# Patient Record
Sex: Female | Born: 1935 | Race: White | Hispanic: No | Marital: Married | Smoking: Former smoker
Health system: Southern US, Community
[De-identification: ages and names within clinical notes are randomized; demographics above are authoritative.]

## PROBLEM LIST (undated history)

## (undated) DIAGNOSIS — Z85528 Personal history of other malignant neoplasm of kidney: Secondary | ICD-10-CM

## (undated) DIAGNOSIS — K219 Gastro-esophageal reflux disease without esophagitis: Secondary | ICD-10-CM

## (undated) DIAGNOSIS — E669 Obesity, unspecified: Secondary | ICD-10-CM

## (undated) DIAGNOSIS — F419 Anxiety disorder, unspecified: Secondary | ICD-10-CM

## (undated) DIAGNOSIS — H269 Unspecified cataract: Secondary | ICD-10-CM

## (undated) DIAGNOSIS — R200 Anesthesia of skin: Secondary | ICD-10-CM

## (undated) DIAGNOSIS — N189 Chronic kidney disease, unspecified: Secondary | ICD-10-CM

## (undated) DIAGNOSIS — F329 Major depressive disorder, single episode, unspecified: Secondary | ICD-10-CM

## (undated) DIAGNOSIS — J40 Bronchitis, not specified as acute or chronic: Secondary | ICD-10-CM

## (undated) DIAGNOSIS — F32A Depression, unspecified: Secondary | ICD-10-CM

## (undated) DIAGNOSIS — I82409 Acute embolism and thrombosis of unspecified deep veins of unspecified lower extremity: Secondary | ICD-10-CM

## (undated) DIAGNOSIS — R350 Frequency of micturition: Secondary | ICD-10-CM

## (undated) DIAGNOSIS — I10 Essential (primary) hypertension: Secondary | ICD-10-CM

## (undated) DIAGNOSIS — Z87440 Personal history of urinary (tract) infections: Secondary | ICD-10-CM

## (undated) DIAGNOSIS — D649 Anemia, unspecified: Secondary | ICD-10-CM

## (undated) DIAGNOSIS — R51 Headache: Secondary | ICD-10-CM

## (undated) DIAGNOSIS — R519 Headache, unspecified: Secondary | ICD-10-CM

## (undated) DIAGNOSIS — R202 Paresthesia of skin: Secondary | ICD-10-CM

## (undated) DIAGNOSIS — J439 Emphysema, unspecified: Secondary | ICD-10-CM

## (undated) DIAGNOSIS — E785 Hyperlipidemia, unspecified: Secondary | ICD-10-CM

## (undated) DIAGNOSIS — M8580 Other specified disorders of bone density and structure, unspecified site: Secondary | ICD-10-CM

## (undated) DIAGNOSIS — M1990 Unspecified osteoarthritis, unspecified site: Secondary | ICD-10-CM

## (undated) DIAGNOSIS — R32 Unspecified urinary incontinence: Secondary | ICD-10-CM

## (undated) DIAGNOSIS — E119 Type 2 diabetes mellitus without complications: Secondary | ICD-10-CM

## (undated) DIAGNOSIS — I739 Peripheral vascular disease, unspecified: Secondary | ICD-10-CM

## (undated) HISTORY — DX: Hyperlipidemia, unspecified: E78.5

## (undated) HISTORY — DX: Gastro-esophageal reflux disease without esophagitis: K21.9

## (undated) HISTORY — DX: Unspecified cataract: H26.9

## (undated) HISTORY — PX: COLONOSCOPY WITH ESOPHAGOGASTRODUODENOSCOPY (EGD): SHX5779

## (undated) HISTORY — PX: CHOLECYSTECTOMY: SHX55

## (undated) HISTORY — PX: OTHER SURGICAL HISTORY: SHX169

## (undated) HISTORY — DX: Chronic kidney disease, unspecified: N18.9

## (undated) HISTORY — DX: Emphysema, unspecified: J43.9

## (undated) HISTORY — PX: LEG AMPUTATION: SHX1105

## (undated) HISTORY — PX: COLONOSCOPY: SHX174

## (undated) HISTORY — DX: Anxiety disorder, unspecified: F41.9

## (undated) HISTORY — DX: Personal history of other malignant neoplasm of kidney: Z85.528

## (undated) HISTORY — PX: CARPAL TUNNEL RELEASE: SHX101

## (undated) HISTORY — DX: Other specified disorders of bone density and structure, unspecified site: M85.80

## (undated) HISTORY — DX: Major depressive disorder, single episode, unspecified: F32.9

## (undated) HISTORY — DX: Depression, unspecified: F32.A

## (undated) HISTORY — DX: Essential (primary) hypertension: I10

---

## 1976-05-24 HISTORY — PX: ABDOMINAL HYSTERECTOMY: SHX81

## 2004-09-18 ENCOUNTER — Ambulatory Visit: Payer: Self-pay | Admitting: Unknown Physician Specialty

## 2005-11-19 ENCOUNTER — Ambulatory Visit: Payer: Self-pay | Admitting: Vascular Surgery

## 2005-11-25 ENCOUNTER — Ambulatory Visit: Payer: Self-pay | Admitting: Vascular Surgery

## 2005-12-07 ENCOUNTER — Ambulatory Visit: Payer: Self-pay | Admitting: Vascular Surgery

## 2005-12-08 ENCOUNTER — Inpatient Hospital Stay: Payer: Self-pay | Admitting: Vascular Surgery

## 2005-12-08 ENCOUNTER — Other Ambulatory Visit: Payer: Self-pay

## 2005-12-20 ENCOUNTER — Emergency Department: Payer: Self-pay | Admitting: Emergency Medicine

## 2006-02-19 ENCOUNTER — Emergency Department: Payer: Self-pay | Admitting: Unknown Physician Specialty

## 2006-03-01 ENCOUNTER — Inpatient Hospital Stay: Payer: Self-pay | Admitting: Internal Medicine

## 2006-03-01 ENCOUNTER — Other Ambulatory Visit: Payer: Self-pay

## 2006-03-11 ENCOUNTER — Other Ambulatory Visit: Payer: Self-pay

## 2006-03-16 ENCOUNTER — Ambulatory Visit: Payer: Self-pay | Admitting: Physical Medicine & Rehabilitation

## 2006-03-16 ENCOUNTER — Inpatient Hospital Stay (HOSPITAL_COMMUNITY)
Admission: RE | Admit: 2006-03-16 | Discharge: 2006-03-25 | Payer: Self-pay | Admitting: Physical Medicine & Rehabilitation

## 2006-03-28 ENCOUNTER — Ambulatory Visit: Payer: Self-pay | Admitting: Physical Medicine & Rehabilitation

## 2006-04-26 ENCOUNTER — Ambulatory Visit: Payer: Self-pay | Admitting: Physical Medicine & Rehabilitation

## 2006-04-26 ENCOUNTER — Encounter
Admission: RE | Admit: 2006-04-26 | Discharge: 2006-07-25 | Payer: Self-pay | Admitting: Physical Medicine & Rehabilitation

## 2006-04-27 ENCOUNTER — Encounter
Admission: RE | Admit: 2006-04-27 | Discharge: 2006-04-27 | Payer: Self-pay | Admitting: Physical Medicine & Rehabilitation

## 2006-05-13 ENCOUNTER — Encounter: Payer: Self-pay | Admitting: Specialist

## 2006-05-24 ENCOUNTER — Encounter: Payer: Self-pay | Admitting: Specialist

## 2006-06-07 ENCOUNTER — Ambulatory Visit: Payer: Self-pay | Admitting: Physical Medicine & Rehabilitation

## 2006-06-08 ENCOUNTER — Encounter
Admission: RE | Admit: 2006-06-08 | Discharge: 2006-09-06 | Payer: Self-pay | Admitting: Physical Medicine & Rehabilitation

## 2006-06-24 ENCOUNTER — Encounter: Payer: Self-pay | Admitting: Specialist

## 2006-07-23 ENCOUNTER — Encounter: Payer: Self-pay | Admitting: Specialist

## 2006-08-23 ENCOUNTER — Encounter: Payer: Self-pay | Admitting: Specialist

## 2006-09-20 ENCOUNTER — Inpatient Hospital Stay: Payer: Self-pay | Admitting: Vascular Surgery

## 2006-10-20 ENCOUNTER — Encounter: Payer: Self-pay | Admitting: Specialist

## 2006-10-23 ENCOUNTER — Encounter: Payer: Self-pay | Admitting: Specialist

## 2006-11-22 ENCOUNTER — Encounter: Payer: Self-pay | Admitting: Specialist

## 2006-12-23 ENCOUNTER — Encounter: Payer: Self-pay | Admitting: Specialist

## 2007-01-04 ENCOUNTER — Ambulatory Visit: Payer: Self-pay | Admitting: Vascular Surgery

## 2007-01-23 ENCOUNTER — Encounter: Payer: Self-pay | Admitting: Specialist

## 2007-02-22 ENCOUNTER — Encounter: Payer: Self-pay | Admitting: Specialist

## 2007-03-25 ENCOUNTER — Encounter: Payer: Self-pay | Admitting: Specialist

## 2007-04-24 ENCOUNTER — Encounter: Payer: Self-pay | Admitting: Specialist

## 2007-10-04 ENCOUNTER — Ambulatory Visit: Payer: Self-pay | Admitting: Surgery

## 2007-10-04 ENCOUNTER — Other Ambulatory Visit: Payer: Self-pay

## 2007-10-10 ENCOUNTER — Ambulatory Visit: Payer: Self-pay | Admitting: Surgery

## 2007-10-12 ENCOUNTER — Other Ambulatory Visit: Payer: Self-pay

## 2007-10-12 ENCOUNTER — Inpatient Hospital Stay: Payer: Self-pay | Admitting: Surgery

## 2008-06-12 ENCOUNTER — Encounter
Admission: RE | Admit: 2008-06-12 | Discharge: 2008-09-10 | Payer: Self-pay | Admitting: Physical Medicine & Rehabilitation

## 2008-06-12 ENCOUNTER — Ambulatory Visit: Payer: Self-pay | Admitting: Physical Medicine & Rehabilitation

## 2008-11-05 ENCOUNTER — Emergency Department: Payer: Self-pay | Admitting: Unknown Physician Specialty

## 2008-12-04 ENCOUNTER — Ambulatory Visit: Payer: Self-pay | Admitting: Anesthesiology

## 2010-02-04 ENCOUNTER — Ambulatory Visit: Payer: Self-pay | Admitting: Vascular Surgery

## 2010-07-27 ENCOUNTER — Ambulatory Visit: Payer: Self-pay | Admitting: Unknown Physician Specialty

## 2010-07-27 DIAGNOSIS — Z860101 Personal history of adenomatous and serrated colon polyps: Secondary | ICD-10-CM | POA: Insufficient documentation

## 2010-07-27 DIAGNOSIS — Z86010 Personal history of colonic polyps: Secondary | ICD-10-CM | POA: Insufficient documentation

## 2010-07-30 LAB — PATHOLOGY REPORT

## 2010-10-06 NOTE — Assessment & Plan Note (Signed)
Ms. Zimmerly returns to clinic today for followup evaluation.  She is a  75 year old female with history of prior left above-knee amputation on  March 04, 2006.  She had obtained her definitive prosthesis and was  working well with that, able to walk approximately at least 200 feet  using it in and outside the home.  That prosthesis was obtained when her  weight was approximately 120 pounds and she has now increased to 154  pounds.  She reports that she is able to get into the prosthesis, but it  is fitting poorly and it causes increased pain.  She last used it  approximately 3-4 weeks ago, but still had severe pain with that prior  prosthesis.  She reports that her other health conditions have remained  stable.   MEDICATIONS:  1. Plavix 75 mg daily.  2. Metoprolol 50 mg b.i.d.  3. Calcitriol 0.25 mcg daily.  4. Lipitor 10 mg daily.  5. Enalapril/hydrochlorothiazide 10/25 one tablet daily.  6. Pantoprazole 40 mg daily.  7. Gabapentin 300 mg 1 q.a.m. and 2 q.p.m.  8. Hydrocodone 5/500 b.i.d. p.r.n.  9. Alprazolam 0.5 mg t.i.d. p.r.n.   REVIEW OF SYSTEMS:  Positive for weight gain.   PHYSICAL EXAMINATION:  GENERAL:  A well-appearing, middle-aged adult  female seated in a manual wheelchair.  VITAL SIGNS:  Blood pressure 124/63 with a pulse of 57, respiratory rate  18, and O2 saturation 96% on room air.  EXTREMITIES:  The patient has 4+/5 strength throughout the bilateral  upper and lower extremities.  Examination of her left above-knee  amputation site shows well-healed wound without open areas.  She does  lack 10 degrees in hip flexion.  Bulk and tone were normal throughout  the bilateral upper and lower extremities.   IMPRESSION:  Status post left above-knee amputation in October 2007 with  persistent fitting problems with recent weight gain.   At the present time, the patient is in need of a new socket and liner  for her above-knee amputation site on the left side.  She had  been K3  Ambulator in the past and I expect that she would be able to return to  that when she gets a proper fitting socket and liner.  We will set her  up with the prosthetist for a refit of that socket and liner with a  waist belt hopefully to be started over the next few days.  She is not  interested in any outpatient therapies at this point.  We will plan on  seeing her in followup on an as-needed basis.           ______________________________  Jarvis Morgan, M.D.     DC/MedQ  D:  06/12/2008 11:11:12  T:  06/13/2008 01:04:53  Job #:  [id number]

## 2010-10-09 NOTE — H&P (Signed)
Amy [name], [name] NO.:  [id number]   MEDICAL RECORD NO.:  [id number]          PATIENT TYPE:  IPS   LOCATION:  [id number]                         FACILITY:  [facility]   PHYSICIAN:  Amy [name], M.D.DATE OF BIRTH:  Mar 26, 1936   DATE OF ADMISSION:  03/16/2006  DATE OF DISCHARGE:                                HISTORY & PHYSICAL   CHIEF COMPLAINT:  Left above knee amputation.   HISTORY OF PRESENT ILLNESS:  This 75 year old white female, admitted on  March 01, 2006, from [facility] with peripheral vascular  disease and failure of previous treatment of stenting in July.  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  She had also undergone a knee femoral to popliteal bypass.  Patient presented on October 9 with dehydration, nausea, abdominal pain and  left lower extremity CV changes.  CT of the abdomen revealed an ileus and  small bowel obstruction.  Creatinine was 2.0, BUN 94 upon admission with a  sodium of 126.  Surgery has been following the patient for small bowel  obstruction and recommended n.p.o. and Pan tube feeds with generalized bowel  rest.  Dr. Ernie [name] and Dr. Hulda [name] followed for left lower extremity pain and  vascular symptoms and eventually felt that her left limb was not salvageable  and amputated the left leg above the knee on March 04, 2006.  The  patient's pain is being controlled with a fentanyl patch, Vicodin and  Neurontin.  She has had advancement to a regular diet.  She had her bowel  disimpacted today, apparently incompletely so.  She is on Cipro for wound  coverage.   REVIEW OF SYSTEMS:  On review of systems, patient reports weakness,  numbness, anxiety, depression, reflux.  Full review is in the written H&P.  Other pertinent and positives listed above.   PAST MEDICAL HISTORY:  Positive for:  1. Bypasses as mentioned above.  2. Hypertension.  3. Dyslipidemia.  4.  Hysterectomy.  5. Carpal tunnel surgery.  6. Elbow surgery.  7. Heavy tobacco use, which stopped in July.   FAMILY HISTORY:  Positive for diabetes.   SOCIAL HISTORY:  The patient is retired and living her husband in a one-  level house, 3 steps to enter.  Husband works day, family can check on  during the day.   FUNCTIONAL HISTORY:  Patient independent with a cane and walker prior to  arrival.   ALLERGIES:  1. PENICILLIN.  2. MORPHINE.  3. ATIVAN.  4. PHENERGAN.   MEDICATIONS:  Include:  1. Plavix 75 mg daily.  2. Evista 50 mg daily.  3. Enalapril HCTZ 10/25 daily.  4. Xanax 0.25 mg b.i.d.  5. Vicodin p.r.n.  6. Lopressor 50 mg b.i.d.   LABORATORY:  Includes a hemoglobin of 9.4, white count of 12.0, platelets  400,000.  Sodium 133, potassium 4.0, BUN and creatinine 3 and 0.7.   PHYSICAL EXAMINATION:  VITAL SIGNS:  Blood pressure is 110/60.  Pulse is 80.  Respiratory rate 18.  Temperature 98.4.  GENERAL:  Patient is pleasant, in no  acute distress.  She is a bit flat in  affect.  HEENT:  Her pupils are equal, round and reactive to light and accommodation.  Extraocular eye movements are intact.  Nose and throat exam generally  unremarkable.  NECK:  Supple without JVD or  lymphadenopathy.  CHEST:  Notable for wheezes, particular at the right base with rales noted  there as well.  Otherwise, scattered were auscultated to a much lesser  extent.  HEART:  Regular rate and rhythm without murmurs, rubs or gallops.  ABDOMEN:  Soft, nontender.  Bowel sounds are positive.  EXTREMITIES:  Showed no clubbing or cyanosis.  Only trace edema.  She had an  area of redness along the right heel consistent with a pressure wound.  This  is a mild stage II.  She also had a tare over the sacral area.  Left leg was  slightly edematous and wound was clean and intact at the left AK site.  Reflexes are generally 2+.  Sensation decreased in the distal limbs.  She  was a bit hypersensitive to touch  the left above knee wound.  Judgment,  orientation and memory were all within normal limits.  Motor function was  generally 5/5, except or pain at the left thigh.  Mood was decreased.  Right  foot was also notable for tight Achilles tendon.   ASSESSMENT/PLAN:  Functional deficits secondary to left above knee  amputation on March 04, 2006 for severe peripheral vascular disease.  Being comprehensive inpatient rehab to assess and treat for lower extremity  use, mobility and balance.  Occupational therapy will assess and treat for  upper extremity use and activities of daily living.  20 rehab  nursing will assist in management bowel, bladder, skin and medication  issues, as well as pain.  Rehab case manager/social worker will assist in  psychosocial and disposition matters.  Estimated length of stay is 7 to 10  days.  Goals:  Modified independent at the wheelchair level.  Prognosis:  Good.  1. Pain management.  Neurontin 300 mg b.i.d., Duragesic patch 25 mcg q.72      hours and Vicodin p.r.n.  2. Deep venous thrombosis prophylaxis, subcu Lovenox daily 40 mg.  3. Acute renal failure.  Observe kidney function and ins and outs.  4. Ileus.  Check admission KUB as patient still was impacted with stool      and has no appetite and abdominal distention.  5. Hypertension.  Lopressor and Lasix.  6. Pulmonary.  Patient likely has chronic obstructive pulmonary disease.      I would like to check x-ray of right chest considering ongoing cough to      rule out pneumonia.  Begin Mucinex for cough 600 mg b.i.d.      Amy [name], M.D.  Electronically Signed    ZTS/MEDQ  D:  03/16/2006  T:  03/17/2006  Job:  [id number]

## 2010-10-09 NOTE — Discharge Summary (Signed)
Amy [name], Amy [name] NO.:  [id number]   MEDICAL RECORD NO.:  [id number]          PATIENT TYPE:  IPS   LOCATION:  [id number]                         FACILITY:  [facility]   PHYSICIAN:  Lauraine Rinne, P.A.  DATE OF BIRTH:  Feb 04, 1936   DATE OF ADMISSION:  03/16/2006  DATE OF DISCHARGE:  03/25/2006                                 DISCHARGE SUMMARY   DISCHARGE DIAGNOSES:  1. Left above-knee amputation March 04, 2006 secondary to severe      peripheral vascular disease.  2. Multiple revascularization procedures.  3. Pain management.  4. Acute renal failure resolved.  5. Ileus resolved.  6. Hypertension.  7. Depression with anxiety.   This is a 75 year old white female admitted March 01, 2006 from [facility] with peripheral vascular disease, failed  percutaneous treatment of left lower extremity disease, and underwent a  stent for a possible pseudoaneurysm in her right external iliac artery July  2007. She also had undergone knee to femoral to popliteal bypass for  continued symptoms of ischemia after percutaneous treatment. The patient  presented with dehydration, nausea, abdominal pain, and left lower extremity  ischemic changes. CT of the abdomen showed ileus with small bowel  obstruction. Noted BUN 94, creatinine 2, sodium 126, potassium 3. She  received intravenous fluids. General surgery was consulted for small bowel  obstruction. Advised the patient to be NPO with nasogastric tube feeds.  Follow up cardiothoracic surgery Dr. Leotis Pain for left lower extremity  pain. Placed on intravenous heparin. She received Dilaudid for pain control.  Noted white blood cell count 26,000. She was placed on broad-spectrum  antibiotics. Dopplers of left lower extremities with decreased flow. Left  lower extremity not felt to be salvageable and underwent a left above knee  amputation October 12 per Dr. Lucky Cowboy. Pain control with Neurontin as well as  fentanyl   patch. Diet advanced to regular. She was admitted for  comprehensive rehab program.   PAST MEDICAL HISTORY:  See Discharge Diagnoses.   ALLERGIES:  PENICILLIN, MORPHINE, ATIVAN, PHENERGAN.   HABITS:  She is a remote smoker. Denies alcohol.   SOCIAL HISTORY:  Retired, lives with husband. One level home, three steps to  entry. Husband works day shifts. Family to check as needed during the day.   MEDICATIONS PRIOR TO ADMISSION:  1. Plavix 75 mg daily.  2. Evista 50 mg daily.  3. Enalapril with hydrochlorothiazide 10/75 daily.  4. Xanax 0.25 mg twice daily.  5. Vicodin as needed.  6. Lopressor 50 mg twice daily.   REHABILITATION HOSPITAL COURSE:  The patient was admitted to inpatient rehab  services with therapies initiated on a 3-hour daily basis consisting of  physical therapy, occupational therapy, and rehabilitation nursing. The  following issues were addressed during patient's rehabilitation stay.  Pertaining to Ms. Wilt left above-knee amputation March 04, 2006  secondary to peripheral vascular disease, surgical site healing nicely. No  signs of infection. Stump shrinker was applied. She would follow up in  amputee clinic. She was to follow up at [facility]  with Dr. Lucky Cowboy. Noted the patient had  undergone multiple revascularization  procedures in the past and monitored. Pain control with the use of Neurontin  300 mg twice daily as well as a Duragesic patch 25 mcg change every three  days.  She was using Vicodin for breakthrough pain. Noted early on at  [facility] with acute renal failure which had since  resolved. BUN 4, creatinine 0.7. Ileus had resolved.  Abdomen soft,  nontender, good bowel sounds. Bowel program was well established. Blood  pressures monitored with diastolic pressures [id number], and she remained on  Lopressor and Lasix.  Noted hospital course depression. Placed on Cymbalta  to both aid in her depression as well  as pain control. Overall for her  functional status she was close supervision to minimal assist for transfers,  no ambulation, minimal assist wheelchair level for mobility. Overall her  strength and endurance greatly improved. The patient was encouraged to  progress. She was to be discharged to home.   LABORATORIES:  Latest labs showed hemoglobin 10.2, hematocrit 30.4, WBC  11.8, platelet 346,000. Sodium 134, potassium 3.9, BUN 4, creatinine 0.7.   DISCHARGE MEDICATIONS AT TIME OF DICTATION:  1. Protonix 40 mg daily.  2. Cymbalta 30 mg daily.  3. Cipro 500 mg twice daily until March 26, 2006 and stop.  4. Plavix 75 mg daily.  5. Lopressor 50 mg twice daily.  6. Multivitamin daily.  7. Neurontin 300 mg twice daily.  8. Lasix 40 mg daily.  9. Potassium chloride 20 mEq daily.  10.Mucinex 600 mg twice daily.  11.Duragesic patch 25 mcg, change every 72 hours.  12.Flomax 0.4 mg at bedtime.  13.Urecholine 25 mg three times daily with slow taper after initial      urinary retention since resolved.  14. The patient had been on      subcutaneous Lovenox throughout her rehabilitation course for deep vein      thrombosis prophylaxis. This was discontinued at time of discharge.   DIET:  Regular.   SPECIAL INSTRUCTIONS:  She was to follow with Dr. Lucky Cowboy at [facility] for ongoing care after above knee amputation. Dr. Doreene Nest,  medical management, Dr. Jarvis Morgan at the outpatient rehab amputee  clinic as advised.      Lauraine Rinne, P.A.     DA/MEDQ  D:  03/24/2006  T:  03/24/2006  Job:  [id number]   cc:   Jarvis Morgan, M.D.  [facility], M.D.  Leotis Pain, M.D.

## 2011-08-31 ENCOUNTER — Ambulatory Visit: Payer: Self-pay | Admitting: Family Medicine

## 2011-09-22 ENCOUNTER — Ambulatory Visit: Payer: Self-pay | Admitting: Ophthalmology

## 2011-09-22 ENCOUNTER — Ambulatory Visit: Payer: Self-pay | Admitting: Family Medicine

## 2011-09-22 LAB — POTASSIUM: Potassium: 4.3 mmol/L (ref 3.5–5.1)

## 2011-10-04 ENCOUNTER — Ambulatory Visit: Payer: Self-pay | Admitting: Ophthalmology

## 2011-10-23 ENCOUNTER — Ambulatory Visit: Payer: Self-pay | Admitting: Family Medicine

## 2011-11-10 ENCOUNTER — Ambulatory Visit: Payer: Self-pay | Admitting: Ophthalmology

## 2011-11-22 ENCOUNTER — Ambulatory Visit: Payer: Self-pay | Admitting: Ophthalmology

## 2012-03-20 ENCOUNTER — Emergency Department: Payer: Self-pay | Admitting: Emergency Medicine

## 2012-03-20 LAB — URINALYSIS, COMPLETE
Ketone: NEGATIVE
Protein: NEGATIVE
Specific Gravity: 1.005 (ref 1.003–1.030)
WBC UR: 56 /HPF (ref 0–5)

## 2012-03-20 LAB — COMPREHENSIVE METABOLIC PANEL
Albumin: 3.5 g/dL (ref 3.4–5.0)
BUN: 23 mg/dL — ABNORMAL HIGH (ref 7–18)
Bilirubin,Total: 0.5 mg/dL (ref 0.2–1.0)
Creatinine: 1.3 mg/dL (ref 0.60–1.30)
Glucose: 82 mg/dL (ref 65–99)
SGPT (ALT): 20 U/L (ref 12–78)
Sodium: 137 mmol/L (ref 136–145)
Total Protein: 7 g/dL (ref 6.4–8.2)

## 2012-03-20 LAB — CBC
MCH: 29.4 pg (ref 26.0–34.0)
Platelet: 183 10*3/uL (ref 150–440)
RBC: 4.33 10*6/uL (ref 3.80–5.20)
WBC: 8.3 10*3/uL (ref 3.6–11.0)

## 2012-03-22 LAB — URINE CULTURE

## 2012-05-08 ENCOUNTER — Ambulatory Visit: Payer: Self-pay | Admitting: Family Medicine

## 2012-06-06 ENCOUNTER — Ambulatory Visit: Payer: Self-pay | Admitting: Family Medicine

## 2012-07-04 ENCOUNTER — Ambulatory Visit: Payer: Self-pay | Admitting: Family Medicine

## 2012-07-31 ENCOUNTER — Ambulatory Visit: Payer: Self-pay | Admitting: Vascular Surgery

## 2012-07-31 LAB — BASIC METABOLIC PANEL
Co2: 32 mmol/L (ref 21–32)
EGFR (African American): 52 — ABNORMAL LOW
EGFR (Non-African Amer.): 44 — ABNORMAL LOW
Glucose: 73 mg/dL (ref 65–99)
Osmolality: 279 (ref 275–301)
Potassium: 4.3 mmol/L (ref 3.5–5.1)

## 2012-08-01 ENCOUNTER — Ambulatory Visit: Payer: Self-pay | Admitting: Family Medicine

## 2012-10-04 ENCOUNTER — Inpatient Hospital Stay: Payer: Self-pay | Admitting: Specialist

## 2012-10-04 LAB — COMPREHENSIVE METABOLIC PANEL
Albumin: 3.4 g/dL (ref 3.4–5.0)
Alkaline Phosphatase: 60 U/L (ref 50–136)
Calcium, Total: 8.6 mg/dL (ref 8.5–10.1)
Chloride: 96 mmol/L — ABNORMAL LOW (ref 98–107)
Co2: 27 mmol/L (ref 21–32)
Creatinine: 1.08 mg/dL (ref 0.60–1.30)
EGFR (African American): 59 — ABNORMAL LOW
EGFR (Non-African Amer.): 51 — ABNORMAL LOW
Glucose: 83 mg/dL (ref 65–99)
Osmolality: 261 (ref 275–301)
Potassium: 4.1 mmol/L (ref 3.5–5.1)
SGPT (ALT): 20 U/L (ref 12–78)
Sodium: 130 mmol/L — ABNORMAL LOW (ref 136–145)

## 2012-10-04 LAB — CBC
HCT: 36 % (ref 35.0–47.0)
HGB: 12.5 g/dL (ref 12.0–16.0)
MCHC: 34.8 g/dL (ref 32.0–36.0)
RBC: 4.17 10*6/uL (ref 3.80–5.20)

## 2012-10-04 LAB — DRUG SCREEN, URINE
Amphetamines, Ur Screen: NEGATIVE (ref ?–1000)
Benzodiazepine, Ur Scrn: POSITIVE (ref ?–200)
Cocaine Metabolite,Ur ~~LOC~~: NEGATIVE (ref ?–300)
MDMA (Ecstasy)Ur Screen: NEGATIVE (ref ?–500)
Methadone, Ur Screen: NEGATIVE (ref ?–300)
Phencyclidine (PCP) Ur S: NEGATIVE (ref ?–25)

## 2012-10-04 LAB — URINALYSIS, COMPLETE
Bilirubin,UR: NEGATIVE
Ketone: NEGATIVE
Nitrite: NEGATIVE
Ph: 5 (ref 4.5–8.0)
Protein: 100
RBC,UR: 5 /HPF (ref 0–5)
Specific Gravity: 1.012 (ref 1.003–1.030)

## 2012-10-05 LAB — BASIC METABOLIC PANEL
Calcium, Total: 8.2 mg/dL — ABNORMAL LOW (ref 8.5–10.1)
Chloride: 97 mmol/L — ABNORMAL LOW (ref 98–107)
Creatinine: 1.05 mg/dL (ref 0.60–1.30)
EGFR (African American): >60
EGFR (Non-African Amer.): 52 — ABNORMAL LOW
Osmolality: 264 (ref 275–301)
Potassium: 3.1 mmol/L — ABNORMAL LOW (ref 3.5–5.1)

## 2012-10-05 LAB — MAGNESIUM: Magnesium: 1.2 mg/dL — ABNORMAL LOW

## 2012-10-05 LAB — LIPID PANEL
HDL Cholesterol: 53 mg/dL (ref 40–60)
Triglycerides: 115 mg/dL (ref 0–200)

## 2012-10-05 LAB — CBC WITH DIFFERENTIAL/PLATELET
Eosinophil %: 0.2 %
HCT: 32.8 % — ABNORMAL LOW (ref 35.0–47.0)
HGB: 11.3 g/dL — ABNORMAL LOW (ref 12.0–16.0)
Lymphocyte %: 11.8 %
Monocyte #: 0.9 x10 3/mm (ref 0.2–0.9)
Monocyte %: 7.2 %
Neutrophil #: 9.9 10*3/uL — ABNORMAL HIGH (ref 1.4–6.5)
Platelet: 147 10*3/uL — ABNORMAL LOW (ref 150–440)
WBC: 12.3 10*3/uL — ABNORMAL HIGH (ref 3.6–11.0)

## 2012-10-06 LAB — BASIC METABOLIC PANEL
Anion Gap: 5 — ABNORMAL LOW (ref 7–16)
Chloride: 102 mmol/L (ref 98–107)
EGFR (African American): >60
Glucose: 87 mg/dL (ref 65–99)
Sodium: 136 mmol/L (ref 136–145)

## 2012-10-06 LAB — CBC WITH DIFFERENTIAL/PLATELET
Basophil %: 0.8 %
Eosinophil %: 0.7 %
HCT: 31.8 % — ABNORMAL LOW (ref 35.0–47.0)
HGB: 11 g/dL — ABNORMAL LOW (ref 12.0–16.0)
Lymphocyte #: 1.3 10*3/uL (ref 1.0–3.6)
MCH: 29.7 pg (ref 26.0–34.0)
MCV: 86 fL (ref 80–100)
Neutrophil #: 6.5 10*3/uL (ref 1.4–6.5)
Neutrophil %: 76.2 %
RBC: 3.7 10*6/uL — ABNORMAL LOW (ref 3.80–5.20)

## 2012-10-06 LAB — CULTURE, BLOOD (SINGLE)

## 2012-10-06 LAB — URINE CULTURE

## 2012-10-31 ENCOUNTER — Ambulatory Visit: Payer: Self-pay | Admitting: Orthopedic Surgery

## 2013-05-16 ENCOUNTER — Emergency Department: Payer: Self-pay | Admitting: Emergency Medicine

## 2013-10-10 ENCOUNTER — Ambulatory Visit: Payer: Self-pay | Admitting: Unknown Physician Specialty

## 2013-10-11 LAB — PATHOLOGY REPORT

## 2013-12-21 ENCOUNTER — Ambulatory Visit: Payer: Self-pay | Admitting: Family Medicine

## 2014-04-25 ENCOUNTER — Ambulatory Visit: Payer: Self-pay | Admitting: Family Medicine

## 2014-06-20 DIAGNOSIS — F411 Generalized anxiety disorder: Secondary | ICD-10-CM | POA: Diagnosis not present

## 2014-08-15 ENCOUNTER — Inpatient Hospital Stay: Payer: Self-pay | Admitting: Internal Medicine

## 2014-08-15 DIAGNOSIS — R531 Weakness: Secondary | ICD-10-CM | POA: Diagnosis not present

## 2014-08-15 DIAGNOSIS — N3000 Acute cystitis without hematuria: Secondary | ICD-10-CM | POA: Diagnosis not present

## 2014-08-15 DIAGNOSIS — J9811 Atelectasis: Secondary | ICD-10-CM | POA: Diagnosis not present

## 2014-08-15 DIAGNOSIS — E119 Type 2 diabetes mellitus without complications: Secondary | ICD-10-CM | POA: Diagnosis not present

## 2014-08-15 DIAGNOSIS — G9341 Metabolic encephalopathy: Secondary | ICD-10-CM | POA: Diagnosis not present

## 2014-08-15 DIAGNOSIS — I959 Hypotension, unspecified: Secondary | ICD-10-CM | POA: Diagnosis not present

## 2014-08-15 DIAGNOSIS — J189 Pneumonia, unspecified organism: Secondary | ICD-10-CM | POA: Diagnosis not present

## 2014-08-15 DIAGNOSIS — G546 Phantom limb syndrome with pain: Secondary | ICD-10-CM | POA: Diagnosis not present

## 2014-08-15 DIAGNOSIS — D696 Thrombocytopenia, unspecified: Secondary | ICD-10-CM | POA: Diagnosis not present

## 2014-08-15 DIAGNOSIS — N289 Disorder of kidney and ureter, unspecified: Secondary | ICD-10-CM | POA: Diagnosis not present

## 2014-08-15 DIAGNOSIS — R410 Disorientation, unspecified: Secondary | ICD-10-CM | POA: Diagnosis not present

## 2014-08-15 DIAGNOSIS — R251 Tremor, unspecified: Secondary | ICD-10-CM | POA: Diagnosis not present

## 2014-08-15 DIAGNOSIS — A419 Sepsis, unspecified organism: Secondary | ICD-10-CM | POA: Diagnosis not present

## 2014-08-15 DIAGNOSIS — R5383 Other fatigue: Secondary | ICD-10-CM | POA: Diagnosis not present

## 2014-08-15 DIAGNOSIS — Z89612 Acquired absence of left leg above knee: Secondary | ICD-10-CM | POA: Diagnosis not present

## 2014-08-15 DIAGNOSIS — N179 Acute kidney failure, unspecified: Secondary | ICD-10-CM | POA: Diagnosis not present

## 2014-08-15 DIAGNOSIS — E860 Dehydration: Secondary | ICD-10-CM | POA: Diagnosis not present

## 2014-08-15 DIAGNOSIS — I10 Essential (primary) hypertension: Secondary | ICD-10-CM | POA: Diagnosis not present

## 2014-08-15 DIAGNOSIS — R05 Cough: Secondary | ICD-10-CM | POA: Diagnosis not present

## 2014-08-15 DIAGNOSIS — R2689 Other abnormalities of gait and mobility: Secondary | ICD-10-CM | POA: Diagnosis not present

## 2014-08-15 LAB — URINALYSIS, COMPLETE
BILIRUBIN, UR: NEGATIVE
GLUCOSE, UR: NEGATIVE mg/dL (ref 0–75)
Hyaline Cast: 6
Ketone: NEGATIVE
Nitrite: NEGATIVE
PH: 5 (ref 4.5–8.0)
Protein: 30
RBC,UR: 5 /HPF (ref 0–5)
SPECIFIC GRAVITY: 1.016 (ref 1.003–1.030)
Squamous Epithelial: 3
WBC UR: 913 /HPF (ref 0–5)

## 2014-08-15 LAB — BASIC METABOLIC PANEL
ANION GAP: 10 (ref 7–16)
BUN: 31 mg/dL — ABNORMAL HIGH
CALCIUM: 8.6 mg/dL — AB
CO2: 27 mmol/L
Chloride: 101 mmol/L
Creatinine: 1.87 mg/dL — ABNORMAL HIGH
EGFR (African American): 30 — ABNORMAL LOW
EGFR (Non-African Amer.): 26 — ABNORMAL LOW
Glucose: 119 mg/dL — ABNORMAL HIGH
Potassium: 4.2 mmol/L
Sodium: 138 mmol/L

## 2014-08-15 LAB — CBC
HCT: 35.8 % (ref 35.0–47.0)
HGB: 11.3 g/dL — AB (ref 12.0–16.0)
MCH: 27.8 pg (ref 26.0–34.0)
MCHC: 31.7 g/dL — AB (ref 32.0–36.0)
MCV: 88 fL (ref 80–100)
Platelet: 149 10*3/uL — ABNORMAL LOW (ref 150–440)
RBC: 4.07 10*6/uL (ref 3.80–5.20)
RDW: 15.8 % — ABNORMAL HIGH (ref 11.5–14.5)
WBC: 14.6 10*3/uL — AB (ref 3.6–11.0)

## 2014-08-15 LAB — TROPONIN I

## 2014-08-15 LAB — PRO B NATRIURETIC PEPTIDE: B-TYPE NATIURETIC PEPTID: 195 pg/mL — AB

## 2014-08-16 LAB — CBC WITH DIFFERENTIAL/PLATELET
BASOS PCT: 0.5 %
Basophil #: 0 10*3/uL (ref 0.0–0.1)
EOS ABS: 0 10*3/uL (ref 0.0–0.7)
EOS PCT: 0.4 %
HCT: 32.9 % — AB (ref 35.0–47.0)
HGB: 10.8 g/dL — AB (ref 12.0–16.0)
LYMPHS ABS: 0.7 10*3/uL — AB (ref 1.0–3.6)
Lymphocyte %: 8.2 %
MCH: 29 pg (ref 26.0–34.0)
MCHC: 32.9 g/dL (ref 32.0–36.0)
MCV: 88 fL (ref 80–100)
MONO ABS: 0.6 x10 3/mm (ref 0.2–0.9)
Monocyte %: 6.8 %
NEUTROS ABS: 6.9 10*3/uL — AB (ref 1.4–6.5)
Neutrophil %: 84.1 %
Platelet: 113 10*3/uL — ABNORMAL LOW (ref 150–440)
RBC: 3.73 10*6/uL — AB (ref 3.80–5.20)
RDW: 15.5 % — ABNORMAL HIGH (ref 11.5–14.5)
WBC: 8.2 10*3/uL (ref 3.6–11.0)

## 2014-08-16 LAB — BASIC METABOLIC PANEL
ANION GAP: 8 (ref 7–16)
BUN: 20 mg/dL
CALCIUM: 7.7 mg/dL — AB
CHLORIDE: 102 mmol/L
CO2: 25 mmol/L
CREATININE: 1.31 mg/dL — AB
GFR CALC AF AMER: 46 — AB
GFR CALC NON AF AMER: 39 — AB
GLUCOSE: 98 mg/dL
POTASSIUM: 3.7 mmol/L
Sodium: 135 mmol/L

## 2014-08-17 LAB — CBC WITH DIFFERENTIAL/PLATELET
BASOS ABS: 0 10*3/uL (ref 0.0–0.1)
Basophil %: 0.5 %
Eosinophil #: 0.1 10*3/uL (ref 0.0–0.7)
Eosinophil %: 2.3 %
HCT: 33.8 % — AB (ref 35.0–47.0)
HGB: 11 g/dL — ABNORMAL LOW (ref 12.0–16.0)
LYMPHS ABS: 1.2 10*3/uL (ref 1.0–3.6)
Lymphocyte %: 20.8 %
MCH: 28.6 pg (ref 26.0–34.0)
MCHC: 32.4 g/dL (ref 32.0–36.0)
MCV: 88 fL (ref 80–100)
MONO ABS: 0.5 x10 3/mm (ref 0.2–0.9)
MONOS PCT: 9.4 %
NEUTROS ABS: 3.8 10*3/uL (ref 1.4–6.5)
Neutrophil %: 67 %
Platelet: 120 10*3/uL — ABNORMAL LOW (ref 150–440)
RBC: 3.83 10*6/uL (ref 3.80–5.20)
RDW: 15.2 % — AB (ref 11.5–14.5)
WBC: 5.7 10*3/uL (ref 3.6–11.0)

## 2014-08-17 LAB — CREATININE, SERUM
CREATININE: 1.11 mg/dL — AB
EGFR (Non-African Amer.): 48 — ABNORMAL LOW
GFR CALC AF AMER: 56 — AB

## 2014-08-18 LAB — CREATININE, SERUM
CREATININE: 1.1 mg/dL — AB
GFR CALC AF AMER: 56 — AB
GFR CALC NON AF AMER: 49 — AB

## 2014-08-19 DIAGNOSIS — K219 Gastro-esophageal reflux disease without esophagitis: Secondary | ICD-10-CM | POA: Diagnosis not present

## 2014-08-19 DIAGNOSIS — G547 Phantom limb syndrome without pain: Secondary | ICD-10-CM | POA: Diagnosis not present

## 2014-08-19 DIAGNOSIS — N2889 Other specified disorders of kidney and ureter: Secondary | ICD-10-CM | POA: Diagnosis not present

## 2014-08-19 DIAGNOSIS — N3000 Acute cystitis without hematuria: Secondary | ICD-10-CM | POA: Diagnosis not present

## 2014-08-19 DIAGNOSIS — Z89612 Acquired absence of left leg above knee: Secondary | ICD-10-CM | POA: Diagnosis not present

## 2014-08-19 DIAGNOSIS — F419 Anxiety disorder, unspecified: Secondary | ICD-10-CM | POA: Diagnosis not present

## 2014-08-19 DIAGNOSIS — D696 Thrombocytopenia, unspecified: Secondary | ICD-10-CM | POA: Diagnosis not present

## 2014-08-19 DIAGNOSIS — N189 Chronic kidney disease, unspecified: Secondary | ICD-10-CM | POA: Diagnosis not present

## 2014-08-19 DIAGNOSIS — E119 Type 2 diabetes mellitus without complications: Secondary | ICD-10-CM | POA: Diagnosis not present

## 2014-08-19 DIAGNOSIS — I739 Peripheral vascular disease, unspecified: Secondary | ICD-10-CM | POA: Diagnosis not present

## 2014-08-19 DIAGNOSIS — N183 Chronic kidney disease, stage 3 (moderate): Secondary | ICD-10-CM | POA: Diagnosis not present

## 2014-08-19 DIAGNOSIS — F411 Generalized anxiety disorder: Secondary | ICD-10-CM | POA: Diagnosis not present

## 2014-08-19 DIAGNOSIS — M810 Age-related osteoporosis without current pathological fracture: Secondary | ICD-10-CM | POA: Diagnosis not present

## 2014-08-19 DIAGNOSIS — N390 Urinary tract infection, site not specified: Secondary | ICD-10-CM | POA: Diagnosis not present

## 2014-08-20 LAB — CULTURE, BLOOD (SINGLE)

## 2014-08-21 DIAGNOSIS — M810 Age-related osteoporosis without current pathological fracture: Secondary | ICD-10-CM | POA: Diagnosis not present

## 2014-08-21 DIAGNOSIS — I739 Peripheral vascular disease, unspecified: Secondary | ICD-10-CM | POA: Diagnosis not present

## 2014-08-21 DIAGNOSIS — K219 Gastro-esophageal reflux disease without esophagitis: Secondary | ICD-10-CM | POA: Diagnosis not present

## 2014-08-21 DIAGNOSIS — E119 Type 2 diabetes mellitus without complications: Secondary | ICD-10-CM | POA: Diagnosis not present

## 2014-08-21 DIAGNOSIS — Z89612 Acquired absence of left leg above knee: Secondary | ICD-10-CM | POA: Diagnosis not present

## 2014-08-21 DIAGNOSIS — F419 Anxiety disorder, unspecified: Secondary | ICD-10-CM | POA: Diagnosis not present

## 2014-08-21 DIAGNOSIS — N3000 Acute cystitis without hematuria: Secondary | ICD-10-CM | POA: Diagnosis not present

## 2014-08-21 DIAGNOSIS — N189 Chronic kidney disease, unspecified: Secondary | ICD-10-CM | POA: Diagnosis not present

## 2014-08-27 DIAGNOSIS — I739 Peripheral vascular disease, unspecified: Secondary | ICD-10-CM | POA: Diagnosis not present

## 2014-08-27 DIAGNOSIS — K219 Gastro-esophageal reflux disease without esophagitis: Secondary | ICD-10-CM | POA: Diagnosis not present

## 2014-08-27 DIAGNOSIS — E119 Type 2 diabetes mellitus without complications: Secondary | ICD-10-CM | POA: Diagnosis not present

## 2014-08-27 DIAGNOSIS — N189 Chronic kidney disease, unspecified: Secondary | ICD-10-CM | POA: Diagnosis not present

## 2014-08-27 DIAGNOSIS — N3000 Acute cystitis without hematuria: Secondary | ICD-10-CM | POA: Diagnosis not present

## 2014-08-27 DIAGNOSIS — M810 Age-related osteoporosis without current pathological fracture: Secondary | ICD-10-CM | POA: Diagnosis not present

## 2014-08-27 DIAGNOSIS — F419 Anxiety disorder, unspecified: Secondary | ICD-10-CM | POA: Diagnosis not present

## 2014-08-27 DIAGNOSIS — Z89612 Acquired absence of left leg above knee: Secondary | ICD-10-CM | POA: Diagnosis not present

## 2014-08-29 DIAGNOSIS — E119 Type 2 diabetes mellitus without complications: Secondary | ICD-10-CM | POA: Diagnosis not present

## 2014-08-29 DIAGNOSIS — K219 Gastro-esophageal reflux disease without esophagitis: Secondary | ICD-10-CM | POA: Diagnosis not present

## 2014-08-29 DIAGNOSIS — F419 Anxiety disorder, unspecified: Secondary | ICD-10-CM | POA: Diagnosis not present

## 2014-08-29 DIAGNOSIS — M810 Age-related osteoporosis without current pathological fracture: Secondary | ICD-10-CM | POA: Diagnosis not present

## 2014-08-29 DIAGNOSIS — N189 Chronic kidney disease, unspecified: Secondary | ICD-10-CM | POA: Diagnosis not present

## 2014-08-29 DIAGNOSIS — I739 Peripheral vascular disease, unspecified: Secondary | ICD-10-CM | POA: Diagnosis not present

## 2014-08-29 DIAGNOSIS — Z89612 Acquired absence of left leg above knee: Secondary | ICD-10-CM | POA: Diagnosis not present

## 2014-08-29 DIAGNOSIS — N3000 Acute cystitis without hematuria: Secondary | ICD-10-CM | POA: Diagnosis not present

## 2014-09-03 DIAGNOSIS — N189 Chronic kidney disease, unspecified: Secondary | ICD-10-CM | POA: Diagnosis not present

## 2014-09-03 DIAGNOSIS — M810 Age-related osteoporosis without current pathological fracture: Secondary | ICD-10-CM | POA: Diagnosis not present

## 2014-09-03 DIAGNOSIS — K219 Gastro-esophageal reflux disease without esophagitis: Secondary | ICD-10-CM | POA: Diagnosis not present

## 2014-09-03 DIAGNOSIS — N3000 Acute cystitis without hematuria: Secondary | ICD-10-CM | POA: Diagnosis not present

## 2014-09-03 DIAGNOSIS — E119 Type 2 diabetes mellitus without complications: Secondary | ICD-10-CM | POA: Diagnosis not present

## 2014-09-03 DIAGNOSIS — Z89612 Acquired absence of left leg above knee: Secondary | ICD-10-CM | POA: Diagnosis not present

## 2014-09-03 DIAGNOSIS — I739 Peripheral vascular disease, unspecified: Secondary | ICD-10-CM | POA: Diagnosis not present

## 2014-09-03 DIAGNOSIS — F419 Anxiety disorder, unspecified: Secondary | ICD-10-CM | POA: Diagnosis not present

## 2014-09-05 DIAGNOSIS — N3000 Acute cystitis without hematuria: Secondary | ICD-10-CM | POA: Diagnosis not present

## 2014-09-05 DIAGNOSIS — Z89612 Acquired absence of left leg above knee: Secondary | ICD-10-CM | POA: Diagnosis not present

## 2014-09-05 DIAGNOSIS — I739 Peripheral vascular disease, unspecified: Secondary | ICD-10-CM | POA: Diagnosis not present

## 2014-09-05 DIAGNOSIS — K219 Gastro-esophageal reflux disease without esophagitis: Secondary | ICD-10-CM | POA: Diagnosis not present

## 2014-09-05 DIAGNOSIS — E119 Type 2 diabetes mellitus without complications: Secondary | ICD-10-CM | POA: Diagnosis not present

## 2014-09-05 DIAGNOSIS — N189 Chronic kidney disease, unspecified: Secondary | ICD-10-CM | POA: Diagnosis not present

## 2014-09-05 DIAGNOSIS — M810 Age-related osteoporosis without current pathological fracture: Secondary | ICD-10-CM | POA: Diagnosis not present

## 2014-09-05 DIAGNOSIS — F419 Anxiety disorder, unspecified: Secondary | ICD-10-CM | POA: Diagnosis not present

## 2014-09-10 DIAGNOSIS — Z89612 Acquired absence of left leg above knee: Secondary | ICD-10-CM | POA: Diagnosis not present

## 2014-09-10 DIAGNOSIS — E119 Type 2 diabetes mellitus without complications: Secondary | ICD-10-CM | POA: Diagnosis not present

## 2014-09-10 DIAGNOSIS — F419 Anxiety disorder, unspecified: Secondary | ICD-10-CM | POA: Diagnosis not present

## 2014-09-10 DIAGNOSIS — N189 Chronic kidney disease, unspecified: Secondary | ICD-10-CM | POA: Diagnosis not present

## 2014-09-10 DIAGNOSIS — K219 Gastro-esophageal reflux disease without esophagitis: Secondary | ICD-10-CM | POA: Diagnosis not present

## 2014-09-10 DIAGNOSIS — I739 Peripheral vascular disease, unspecified: Secondary | ICD-10-CM | POA: Diagnosis not present

## 2014-09-10 DIAGNOSIS — M810 Age-related osteoporosis without current pathological fracture: Secondary | ICD-10-CM | POA: Diagnosis not present

## 2014-09-10 DIAGNOSIS — N3000 Acute cystitis without hematuria: Secondary | ICD-10-CM | POA: Diagnosis not present

## 2014-09-12 DIAGNOSIS — Z89612 Acquired absence of left leg above knee: Secondary | ICD-10-CM | POA: Diagnosis not present

## 2014-09-12 DIAGNOSIS — M810 Age-related osteoporosis without current pathological fracture: Secondary | ICD-10-CM | POA: Diagnosis not present

## 2014-09-12 DIAGNOSIS — N3000 Acute cystitis without hematuria: Secondary | ICD-10-CM | POA: Diagnosis not present

## 2014-09-12 DIAGNOSIS — N189 Chronic kidney disease, unspecified: Secondary | ICD-10-CM | POA: Diagnosis not present

## 2014-09-12 DIAGNOSIS — E119 Type 2 diabetes mellitus without complications: Secondary | ICD-10-CM | POA: Diagnosis not present

## 2014-09-12 DIAGNOSIS — I739 Peripheral vascular disease, unspecified: Secondary | ICD-10-CM | POA: Diagnosis not present

## 2014-09-12 DIAGNOSIS — K219 Gastro-esophageal reflux disease without esophagitis: Secondary | ICD-10-CM | POA: Diagnosis not present

## 2014-09-12 DIAGNOSIS — F419 Anxiety disorder, unspecified: Secondary | ICD-10-CM | POA: Diagnosis not present

## 2014-09-13 NOTE — Op Note (Signed)
PATIENT NAME:  Amy [name], Amy [name] MR#:  [id number] DATE OF BIRTH:  1936-01-31  DATE OF PROCEDURE:  07/31/2012  PREOPERATIVE DIAGNOSES: 1.  Severe peripheral vascular disease.  2.  Iliac artery stenosis, status post previous interventions.  3.  Left above-the-knee amputation stump pain, worrisome for ischemia.  4.  Hypertension.   POSTOPERATIVE DIAGNOSES: 1.  Severe peripheral vascular disease.  2.  Iliac artery stenosis, status post previous interventions.  3.  Left above-the-knee amputation stump pain, worrisome for ischemia.  4.  Hypertension.   PROCEDURES: 1.  Ultrasound guidance for vascular access, left radial artery.  2.  Catheter placement, distal left external iliac/proximal common femoral artery from left radial approach.  3.  Aortogram and selective left lower extremity angiogram.   SURGEON: Algernon Huxley, MD   ANESTHESIA: Local, with monitored conscious sedation.   BLOOD LOSS: Approximately 25 mL.   FLUOROSCOPY TIME: Approximately 4 minutes.   CONTRAST USED: 44 mL.   INDICATION FOR PROCEDURE: This is a 79 year old white female who is well-known to my service. She has a long history of significant for peripheral vascular disease. She is complaining of a stump in her left above-knee amputation site. This amputation is some 77 to 77 years old. This is worrisome for some component of ischemic pain. She has a history of iliac interventions. A noninvasive study suggests there may be a greater than 50% stenosis to the left iliac. She had a known left profunda femoris occlusion which is well-collateralized to the proximal common femoral artery and fed her stump on imaging back in 2011.   She was brought in for an angiogram of the left lower extremity to evaluate for iliac stenosis present or a change in her disease process which would cause more ischemic symptoms. Informed consent was obtained.   DESCRIPTION OF PROCEDURE: The patient was brought to the vascular and interventional  radiology suite. Left upper extremity was sterilely prepped and draped, and a sterile surgical field was created. The left radial artery was accessed without difficulty with a micropuncture needle. A micropuncture wire was placed in the micropuncture sheath. The wire would not initially cross, and it was found that her left radial artery was occluded in the proximal portion of the radial artery just distal to the antecubital fossa. Using a stiff angled Glidewire and a glide catheter I was able to cross this occlusion without difficulty and confirm intraluminal flow in the brachial artery. Initially the wire did not want to traverse the subclavian artery, and imaging was performed which demonstrated moderate to high-grade stenosis in the 70 to 80% range in the left subclavian artery, but this was able to be traversed after imaging with the [employer] wire. I then took a CXI catheter and parked this in the aorta at the L1 level, and an AP aortogram was performed. A 5-French catheter did not want to traverse the radial occlusion and I did not think without left arm symptoms that ballooning her radial or subclavian arteries was a necessary procedure. The imaging through the CXI catheter was adequate. The aorta itself was patent.  I advanced into the left iliac system. The left iliac artery had a previously placed stent that was patent. There was an approximately 30 to 35% stenosis in the left common iliac artery from a calcific lesion. This was not flow-limiting. The catheter was advanced down to the distal left external iliac artery and to just above the femoral head at the common femoral artery. Imaging was then performed. The  profunda femoris artery was occluded proximally, but this had been present back in 2011. This was well-collateralized through circumflex and femoral branches, and then imaging down to the bottom of the stump showed good collateralization with reasonably well-preserved arterial flow and no  worsening of her arterial perfusions over the past 77 to 3 years from her images back in 2011. There was no lesion requiring intervention, and at this point  I elected to terminate the procedure. The diagnostic catheter was removed. The sheath was removed. Pressure was held at the site. A sterile dressing was placed. The patient tolerated the procedure well and was taken to the recovery room in stable condition.     ____________________________ Algernon Huxley, MD [email or web address] D: 07/31/2012 12:49:00 ET T: 07/31/2012 14:04:07 ET JOB#: [id number]  cc: Algernon Huxley, MD, <Dictator> Algernon Huxley MD ELECTRONICALLY SIGNED 08/02/2012 14:05

## 2014-09-13 NOTE — Discharge Summary (Signed)
PATIENT NAME:  Amy [name], DUNAWAY MR#:  [id number] DATE OF BIRTH:  12-16-1935  DATE OF ADMISSION:  10/04/2012 DATE OF DISCHARGE:  10/06/2012  For a detailed note, please take a look at the history and physical done on admission by Dr. Bridgette Habermann.    DIAGNOSES AT DISCHARGE:  1.  Altered mental status and metabolic encephalopathy secondary to urinary tract infection.  2.  Urinary tract infection with bacteremia secondary to Escherichia coli.  3.  Anxiety/depression. 4.  Hypertension. 5.  Neuropathy.  6.  Hyperlipidemia.   DIET: The patient is being discharged on a low-sodium, low-fat diet.   ACTIVITY: As tolerated.   FOLLOWUP: With Dr. Keith Rake in the next 1 to 2 weeks.   DISCHARGE MEDICATIONS: Ranitidine 150 mg b.i.d., Celexa 20 mg daily, Xanax 0.5 to 1 tab t.i.d. as needed, gabapentin 300 mg 1 tab in the morning and 2 tabs at bedtime, HCTZ/triamterene 25/37.5 one tab daily, omeprazole 20 mg b.i.d., simvastatin 40 mg daily, Plavix 75 mg  daily,  metoprolol tartrate 50 mg b.i.d. and ciprofloxacin 250 mg b.i.d. x 12 days.   PERTINENT STUDIES DURING THE HOSPITAL COURSE: A CT scan of the head done on admission without contrast showing no evidence of acute ischemia or hemorrhagic infarction. No acute intracranial mass effect. Mild stable ventriculomegaly.   Blood and urine cultures to be positive for E. coli, which are pansensitive.   BRIEF HOSPITAL COURSE: This is a 79 year old female with medical problems as mentioned above, presented to the hospital on 10/04/2012 secondary to hallucinations, confusion and altered mental status.  1.  Altered mental status and metabolic encephalopathy. This was likely secondary to the urinary tract infection and sepsis. The patient's urinalysis was grossly positive on admission. The patient's CT head was negative on admission. She was started on IV antibiotics and IV fluids. Her mental status has significantly improved since then. She was on IV Levaquin and is  currently being discharged on p.o. ciprofloxacin. The patient's mental status is now back down to baseline.  2.  UTI with bacteremia. This was likely the cause of the patient's metabolic encephalopathy and altered mental status. The patient's blood and urine cultures both grew out E. coli, which were pansensitive. As mentioned, she was treated with IV Levaquin in the hospital and is being discharged on p.o. ciprofloxacin for another 12 days to finish a 2-week total course of antibiotic therapy.  3.  Anxiety/depression. The patient was maintained on Celexa and Xanax and remained stable. She will continue that.  4.  Hyperlipidemia. The patient was maintained on simvastatin. She will resume that. 5.  Neuropathy. The patient was maintained on Neurontin. She will also resume that upon discharge.  6.  GERD. The patient was maintained on her Prilosec and she will resume that upon discharge too.   CODE STATUS: The patient is a DNI/DNR.   DISPOSITION: She is being discharged home.  TIME SPENT ON THE DISCHARGE: 40 minutes.   ____________________________ Belia Heman. Verdell Carmine, MD [email or web address] D: 10/06/2012 16:17:51 ET T: 10/06/2012 23:10:12 ET JOB#: [id number]  cc: Belia Heman. Verdell Carmine, MD, <Dictator> [facility] Myrla Halsted, MD Henreitta Leber MD ELECTRONICALLY SIGNED 10/10/2012 20:03

## 2014-09-13 NOTE — H&P (Signed)
PATIENT NAME:  Amy [name], EKMAN MR#:  [id number] DATE OF BIRTH:  [birth year]  DATE OF ADMISSION:  10/04/2012  REFERRING PHYSICIAN:  Dr. Jasmine December.   PRIMARY CARE PHYSICIAN:  Dr. Manuella Ghazi at [facility].  CHIEF COMPLAINT:  Referred here by PCP for chills, hypotension and hallucinations and UTI.   HISTORY OF PRESENT ILLNESS: The patient is a pleasant 79 year old obese, Caucasian female who is presently here with her husband.  The information was derived from the records, ER physician and the husband. The patient apparently has had multiple UTIs, the last one was several weeks ago and she got treated for a week. She has had off and on dysuria, urgency and hesitancy with increased frequency of urination in the last week or so and had an appointment to her PCP. This morning, the patient had acute hallucinations, per husband, and saw things and spoke with people who were not present and the patient's husband thought the patient described herself to be in another place. Currently, that is improved. However, when she went to her PCP this morning was noted to have relative hypotension associated with chills and evidence for UTI; therefore, asked to be referred to the hospital. Here, she has dirty urine. CT of the head is negative for acute stroke. She has mild hyponatremia and hospitalist services were contacted for further evaluation and management.   PAST MEDICAL HISTORY:  She has severe peripheral vascular disease, diabetes, proteinuria, history of asthmatic bronchitis, atherosclerosis of aorta, osteoporosis, history of fatty liver and vitamin D deficiency, history of GERD, anxiety, depression, history of phantom limb pain, history of left AKA, hypertension, history of altered mental status and hallucinations in the past.   ALLERGIES:  ATIVAN, MORPHINE, PENICILLIN AND PHENERGAN.   SOCIAL HISTORY:  No tobacco, alcohol or drug use; lives with her husband, limited mobility.   FAMILY HISTORY:  Father with cancer, mother with stomach cancer, a brother with parotid gland cancer, a brother with diabetes.   OUTPATIENT MEDICATIONS: Alprazolam 0.5 to 1 mg tab 3 times a day as needed, citalopram 20 mg once a day, gabapentin 300 mg in the morning and 600 mg at bedtime, hydrochlorothiazide/triamterene 25/37.5 mg 1 cap once a day, omeprazole 20 mg 1 cap 2 times a day, Plavix 75 mg daily, ranitidine 150 mg 2 times a day, simvastatin 40 mg daily, vitamin D two 50,000 international units q. weekly.  REVIEW OF SYSTEMS: CONSTITUTIONAL: Positive for chills and global weakness without focal weakness.  EYES:  No blurry vision or double vision.  ENT:  Has chronic rhinorrhea, no sore throat.  RESPIRATORY:  No cough, wheezing, shortness of breath.  CARDIOVASCULAR:  Denies chest pain or swelling in the legs. Has high blood pressure.  GASTROINTESTINAL:  Denies nausea, vomiting or diarrhea. No abdominal pain or bloody stools.  GENITOURINARY:  Has dysuria, increased frequency, hesitancy. No incontinence.  ENDOCRINE: Has polyuria. No nocturia. HEMATOLOGIC AND LYMPHATIC:  No anemia or easy bruising.  SKIN:  No rashes.  MUSCULOSKELETAL:  Has chronic arthritis in a phantom limb. NEUROLOGIC:  Denies any focal weakness.  PSYCHIATRIC:  Has history of anxiety and depression.   PHYSICAL EXAMINATION: VITAL SIGNS: Temperature on arrival 98.7, pulse 67, respiratory rate 18, initial blood pressure 194/55, last blood pressure 135/95, O2 sat 93% on room air.  GENERAL: The patient is an obese Caucasian female sitting in bed, somewhat tremulous, eating a burger.  HEENT: Normocephalic, atraumatic. Pupils are equal and reactive. Anicteric sclerae. Extraocular muscles intact. Moist mucous membranes.  NECK:  Supple. No thyroid tenderness. No cervical lymphadenopathy.  CARDIOVASCULAR: S1, S2 regular rate and rhythm. No significant murmurs, rubs or gallops.  LUNGS:  Clear to auscultation without wheezing, rhonchi or rales.   ABDOMEN: Soft, nontender, nondistended. Positive bowel sounds in all quadrants. No organomegaly appreciated.  EXTREMITIES: Left AKA. No significant lower extremity edema on the right. No significant rashes on the skin. NEUROLOGIC:  Cranial nerves II through XII appear to be grossly intact. Strength is 5/5 in all extremities.   PSYCHIATRIC: Awake, alert, oriented x 2. Cooperative, pleasant.  LABORATORY DATA:  Glucose 83, creatinine 1.08, sodium 130, potassium 4.1. LFTs: Total protein 6.1, otherwise within normal limits. Troponin negative. Urine drug screen positive for benzos and opiates, but she is on benzos as an outpatient. UA positive with 3+ leukocyte esterase, 169 WBC, 5 RBC and 3+ bacteria.  CAT scan of the head without contrast showing no evidence of acute ischemic or hemorrhagic infarct. No intracranial mass effect. Mild stable ventriculomegaly.  Old lacunar infarcts in the basal ganglia are present bilaterally.   ASSESSMENT AND PLAN:  We have a 79 year old Caucasian female with peripheral vascular disease and, history of recurrent urinary tract infections with a last urinary tract infection several weeks ago, who has had symptomatic urinary tract infection for the past week associated with hypotension and likely acute delirium with hallucinations.   In regards to the altered mental status, this likely is secondary to acute deliria. Per her husband, although she has no diagnosis of dementia, per her PCP's note, she has had forgetfulness and this could make her more susceptible to acute delirium more so if she has ongoing acute issues such as an infection. She appears to have a positive urinalysis. At this point we would decrease the benzodiazepine and start her on p.r.n. Risperdal as needed for agitation. She was awake, alert, oriented x 2 at this point and had CT of the head, which is negative and has mild hyponatremia, but otherwise no significant electrolyte abnormalities. We will check a  TSH, treat the urinary tract infection and see how she does.  For the mild hyponatremia, withhold the hydrochlorothiazide, start gentle IV fluids. This will also help with the hypotension as well. The blood pressure is better already with IV fluids here. We would follow up with urine cultures, blood cultures. The patient has been given a dose of Levaquin and I would schedule this again for tomorrow and place her on some standing, normal saline. I would continue her depression and neuropathic pain medications, as well.  Continue her simvastatin. I will check a hemoglobin A1c, a lipid profile and add TSH. I would consider placing her with a sitter if agitation gets worse. We would start her on heparin for deep vein thrombosis prophylaxis and a proton pump inhibitor.  THE PATIENT IS DNR PER HER REQUEST.  TOTAL TIME SPENT:  50 minutes.    ____________________________ Vivien Presto, MD sa:ce D: 10/04/2012 16:03:17 ET T: 10/04/2012 16:36:21 ET JOB#: [id number]  cc: Vivien Presto, MD, <Dictator> [facility] Myrla Halsted, MD  Karel Jarvis [facility] MD ELECTRONICALLY SIGNED 10/24/2012 12:28

## 2014-09-15 NOTE — Op Note (Signed)
PATIENT NAME:  Amy [name], Amy [name] MR#:  [id number] DATE OF BIRTH:  January 13, 1936  DATE OF PROCEDURE:  11/22/2011  PREOPERATIVE DIAGNOSIS: Visually significant cataract of the right eye.   POSTOPERATIVE DIAGNOSIS: Visually significant cataract of the right eye.   OPERATIVE PROCEDURE: Cataract extraction by phacoemulsification with implant of intraocular lens to right eye.   SURGEON: Birder Robson, MD.   ANESTHESIA:  1. Managed anesthesia care.  2. Topical tetracaine drops followed by 2% Xylocaine jelly applied in the preoperative holding area.   COMPLICATIONS: None.   TECHNIQUE:  Stop-and-chop    DESCRIPTION OF PROCEDURE: The patient was examined and consented in the preoperative holding area where the aforementioned topical anesthesia was applied to the right eye and then brought back to the Operating Room where the right eye was prepped and draped in the usual sterile ophthalmic fashion and a lid speculum was placed. A paracentesis was created with the side port blade and the anterior chamber was filled with viscoelastic. A near clear corneal incision was performed with the steel keratome. A continuous curvilinear capsulorrhexis was performed with a cystotome followed by the capsulorrhexis forceps. Hydrodissection and hydrodelineation were carried out with BSS on a blunt cannula. The lens was removed in a stop-and-chop technique and the remaining cortical material was removed with the irrigation-aspiration handpiece. The capsular bag was inflated with viscoelastic and the Technus ZCB00 22.5-diopter lens, serial number [id number] was placed in the capsular bag without complication. The remaining viscoelastic was removed from the eye with the irrigation-aspiration handpiece. The wounds were hydrated. The anterior chamber was flushed with Miostat and the eye was inflated to physiologic pressure. The wounds were found to be water tight. The eye was dressed with Vigamox. The patient was given  protective glasses to wear throughout the day and a shield with which to sleep tonight. The patient was also given drops with which to begin a drop regimen today and will follow-up with me in one day.   ____________________________ Livingston Diones. Malorie Bigford, MD wlp:drc D: 11/22/2011 17:33:23 ET T: 11/23/2011 09:58:46 ET JOB#: [id number]  cc: Mikhi Athey L. Jonie Burdell, MD, <Dictator>  Livingston Diones Marcayla Budge MD ELECTRONICALLY SIGNED 12/01/2011 9:58

## 2014-09-15 NOTE — Op Note (Signed)
PATIENT NAME:  Amy [name], Amy [name] MR#:  [id number] DATE OF BIRTH:  19-Jul-1935  DATE OF PROCEDURE:  10/04/2011  PREOPERATIVE DIAGNOSIS: Visually significant cataract of the left eye.   POSTOPERATIVE DIAGNOSIS: Visually significant cataract of the left eye.   OPERATIVE PROCEDURE: Cataract extraction by phacoemulsification with implant of intraocular lens to left eye.   SURGEON: Birder Robson, MD.   ANESTHESIA:  1. Managed anesthesia care.  2. Topical tetracaine drops followed by 2% Xylocaine jelly applied in the preoperative holding area.   COMPLICATIONS: None.   TECHNIQUE:  Stop and chop.   DESCRIPTION OF PROCEDURE: The patient was examined and consented in the preoperative holding area where the aforementioned topical anesthesia was applied to the left eye and then brought back to the Operating Room where the left eye was prepped and draped in the usual sterile ophthalmic fashion and a lid speculum was placed. A paracentesis was created with the side port blade and the anterior chamber was filled with viscoelastic. A near clear corneal incision was performed with the steel keratome. A continuous curvilinear capsulorrhexis was performed with a cystotome followed by the capsulorrhexis forceps. Hydrodissection and hydrodelineation were carried out with BSS on a blunt cannula. The lens was removed in a stop and chop technique and the remaining cortical material was removed with the irrigation-aspiration handpiece. The capsular bag was inflated with viscoelastic and Tecnis ZCB00 22.0-diopter lens, serial number [id number] was placed in the capsular bag without complication. The remaining viscoelastic was removed from the eye with the irrigation-aspiration handpiece. The wounds were hydrated. The anterior chamber was flushed with Miostat and the eye was inflated to physiologic pressure. The wounds were found to be water tight. The eye was dressed with Vigamox. The patient was given protective glasses  to wear throughout the day and a shield with which to sleep tonight. The patient was also given drops with which to begin a drop regimen today and will follow-up with me in one day.  ____________________________ Livingston Diones. Maritsa Hunsucker, MD wlp:slb D: 10/04/2011 10:49:19 ET T: 10/04/2011 11:07:31 ET JOB#: [id number]  cc: Tyrian Peart L. Shernell Saldierna, MD, <Dictator> Livingston Diones Nyzir Dubois MD ELECTRONICALLY SIGNED 10/13/2011 13:37

## 2014-09-22 NOTE — Discharge Summary (Signed)
PATIENT NAME:  Amy [name], Amy [name] MR#:  [id number] DATE OF BIRTH:  1936/03/20  DATE OF ADMISSION:  08/15/2014 DATE OF DISCHARGE:  08/18/2014  ADMITTING DIAGNOSES:  1.  Altered mental status.  2.  Dehydration.  3.  Urinary tract infection.   DISCHARGE DIAGNOSES:  1.  Acute on chronic renal failure, resolving.  2.  Left kidney mass which needs to be evaluated with MRI as outpatient.  3.  Cystitis without hematuria.  4.  Metabolic encephalopathy.  5.  Hypotension.  6.  Sepsis due to unknown bacterial organism.  7.  Generalized weakness.  8.  Lung atelectasis.  9.  Mild hypoxia, resolved.  10.  History of hypertension, hyperlipidemia, gastroesophageal reflux disease, left above-knee amputation.  11.  History of peripheral vascular disease, as well as diabetes.   DISCHARGE CONDITION: Stable.   DISCHARGE MEDICATIONS: The patient is to continue her outpatient medications which are: Citalopram 10 mg daily, alprazolam 1 tablet 3 times daily as needed (unknown dose), simvastatin 40 mg daily, metoprolol 50 mg twice daily, gabapentin 300 mg 3 times daily, Plavix 75 mg p.o. daily, ranitidine 150 mg daily, acetaminophen/hydrocodone 325 mg/7.5 mg 1 tablet every 8 hours as needed, Protonix 40 mg daily, levofloxacin 750 mg every 48 hours for 5 more days. The patient is not to take hydrochlorothiazide/triamterene unless recommended by her PCP.   HOME OXYGEN: None.   DIET: A 2 gram-salt, low-fat, low-cholesterol, regular consistency.   ACTIVITY LIMITATIONS: As tolerated. Referral to home health physical therapy.    FOLLOWUP APPOINTMENTS: With Dr. Manuella Ghazi in 2 days after discharge.   CONSULTANTS: Care management and social work.   RADIOLOGIC STUDIES: Chest x-ray PA and lateral on 08/15/2014 showed small bilateral pleural effusions and indistinct bibasilar opacity which are nonspecific. CT of head without contrast on 08/15/2014 revealed no acute intracranial abnormality, chronic small vessel ischemia.  Ultrasound of kidneys bilateral on 08/16/2014 showed a left mid renal cortical mass. This could represent malignancy such as renal cell carcinoma, benign mass such as oncocytoma, or less likely complicated cyst. Further evaluation is recommended as outpatient, nonemergent abdominal MRI with contrast according to renal mass protocol. Renal cortical thinning and increased echogenicity bilaterally compatible with medical renal disease. No acute abnormality or hydronephrosis was noted. Chest x-ray PA and lateral on 08/18/2014 showed mild left basal atelectasis. No edema or consolidation. Heart borderline enlarged.   HISTORY OF PRESENT ILLNESS: The patient is a 79 year old female with history of peripheral vascular disease, history of left AKA, who was brought to the hospital to the Emergency Room on 08/15/2014 with altered mental status, generalized weakness, tremors, as well as shortness of breath. Please refer to Dr. Governor Specking admission note on 08/15/2014. On arrival to the hospital, the patient's temperature was 99.1, pulse was 72. Her blood pressure initially was 86/53. Oxygen saturations were 94% on 2 liters of oxygen per nasal cannula and was 90% on room air. Physical exam was unremarkable.   The patient's lab data done on arrival to the hospital showed elevated glucose level of 119. Brain natriuretic peptide was 195. BUN and creatinine were 31 and 1.87 respectively. The patient's troponin was less than 0.03. White blood cell count was elevated at 14.6, hemoglobin was 11.3. platelet count was 149,000. The patient's blood cultures taken on 08/15/2014 did not show any growth. The patient's urinalysis revealed amber turbid urine, negative for glucose, bilirubin, or ketones. Specific gravity was 1.016, pH was 5.0, blood 1+, 30 mg/dL protein, negative for nitrites, 3+ leukocyte esterase, 5 red  blood cells, 913 white blood cells, 3+ bacteria, 3 epithelial cells, white blood cell count was present as well as hyaline  casts. EKG showed normal sinus rhythm at 74 beats per minute, nonspecific T wave abnormality.  HOSPITAL COURSE: The patient was admitted to the hospital for further evaluation. She was initiated on IV fluids and her kidney function improved. On the 08/18/2014, the patient's creatinine was 1.1 with estimated GFR of 49. On admission, the patient's GFR was only 26. It was felt that the patient's kidney function improved. The patient was dehydrated. The patient's condition overall improved with rehydration. The patient was treated with antibiotic and recommended to continue antibiotic for 5 more days to complete course since it was felt that the patient had urinary tract infection of unclear etiology.   In regards to acute renal failure, the patient's creatinine, as mentioned above, was high. The patient was rehydrated and treated for infection and her creatinine improved to 1.1. The patient's ultrasound of kidneys, however, showed a left kidney mass which needs to have MRI evaluation as an outpatient due to a mid renal cortical mass. Kidney function also needs to be observed very closely. The patient had her hydrochlorothiazide/triamterene discontinued because of dehydration issues.   In regard to metabolic encephalopathy, the patient's metabolic encephalopathy resolved with rehydration and antibiotic therapy.   In regards to hypotension, the patient was rehydrated and her hypotension resolved. As mentioned above, the patient's diuretic Triamterene/HCTZ is being placed on hold to be restarted as needed by her primary care physician.   In regards to sepsis of unknown bacterial organism, again the patient was felt to have sepsis due to urinary tract infection and fortunately the patient's blood cultures were negative. However, she responded to antibiotic therapy. Levaquin was given to her on admission and she is to continue Levaquin for the next 5 days to complete course.   In regards to generalized weakness,  the patient was evaluated by a physical therapist and recommended home health physical therapy.   The patient was noted to be a little hypoxic while she was in the hospital. Her chest x-ray, however, showed lung atelectasis. She had minimal cough with no significant sputum production. Levaquin would cover pneumonia or bronchitis if it was that; however, we felt that the patient likely had just some atelectasis due to her resting position in the hospital.   For her chronic medical problems such as hypertension, hyperlipidemia, gastroesophageal reflux disease, as well as peripheral vascular disease, the patient is to continue her outpatient management. No significant changes were made.   For her left AKA and stump pain, she is to continue pain medications, as well as stool softeners as needed.   She is being discharged in stable condition with the above-mentioned medications and followup. On the day of discharge, temperature was 98.6, pulse was 68, respiration rate was 18, blood pressure 121/72, saturation was 94% on room air at rest and 93% on exertion.   TIME SPENT: 40 minutes on the patient.    ____________________________ Theodoro Grist, MD rv:TT D: 08/19/2014 12:25:08 ET T: 08/20/2014 00:26:09 ET JOB#: [id number]  cc: Theodoro Grist, MD, <Dictator> Rochel Brome, MD [place] MD ELECTRONICALLY SIGNED 09/01/2014 15:18

## 2014-09-22 NOTE — H&P (Signed)
PATIENT NAME:  Amy [name], Amy [name] MR#:  [id number] DATE OF BIRTH:  [birth year]  DATE OF ADMISSION:  08/15/2014  PRIMARY CARE PHYSICIAN:  Dr. Keith Rake.    EMERGENCY ROOM PHYSICIAN:  Dr. Reita Cliche.    CHIEF COMPLAINT:  Generalized weakness, tremors, shortness of breath.   HISTORY OF PRESENT ILLNESS: A 79 year old female patient with history of diabetes, peripheral vascular disease, asthmatic bronchitis, osteoporosis, GERD, anxiety, depression, phantom limb pain, comes in because of increased weakness, tremors, confusion. Husband noted that she is more weak with generalized tremors and confusion since yesterday. Denies any frequency or urgency of urination. No fever at home. Complains of some shortness of breath and cough for last 2 days. No orthopnea. No PND. No pedal edema. The patient noted to have a UTI with elevated white count 14.6. Also has evidence of small basilar opacities.  Regarding that we are admitting the patient.   PAST MEDICAL HISTORY: Significant for history of left above knee amputation, history of bronchitis, PVD, diabetes.   ALLERGIES: ATIVAN, MORPHINE, PENICILLIN, PHENERGAN.   SOCIAL HISTORY: No smoking, no drinking. Lives with husband.  Uses wheelchair for mobility and husband helps her with wheelchair outside.   FAMILY HISTORY: Father had cancer. Mother had stomach cancer. Brother with parotid gland cancer. Brother also has diabetes.    PAST SURGICAL HISTORY: Significant for left AKA, stents in the left leg.   MEDICATIONS AT HOME:  Percocet 7.5/325 every 8 hours p.r.n., Xanax 1 mg p.o. t.i.d., Celexa 20 mg p.o. daily, Plavix 75 mg p.o. daily, Neurontin 300 mg p.o. t.i.d., HCTZ with triamterene 25/37.5 mg daily, metoprolol 50 mg b.i.d., pantoprazole 40 mg daily, ranitidine 150 mg daily, simvastatin 40 mg daily.   REVIEW OF SYSTEMS:   CONSTITUTIONAL: The patient had no fever, but just has weakness.  EYES: No blurred vision.  EARS, NOSE, AND THROAT: No tinnitus. No epistaxis.  No difficulty swallowing.  RESPIRATORY: Does have some trouble breathing, cough. No wheezing.  CARDIOVASCULAR: No chest pain, no orthopnea, no pedal edema.  GASTROINTESTINAL: No nausea. No vomiting. No abdominal pain.  GENITOURINARY: No dysuria. No hematuria.  ENDOCRINE: No polyuria or nocturia.  HEMATOLOGIC: No anemia or easy bruising.  INTEGUMENTARY: No skin rashes.  MUSCULOSKELETAL: Has left above-knee amputation.  NEUROLOGIC: No numbness, no weakness.  PSYCHIATRIC: No anxiety or  insomnia.   PHYSICAL EXAMINATION:   VITAL SIGNS:  Temperature 99.1, heart rate 72, blood pressure initially 86/53, repeat 100/54, saturations 94% on room air. The patient's O2 saturation on 2 liters is 94%, it was 90% on room air at one point.   GENERAL: The patient is a well-developed, well-nourished female, not in distress, answering questions appropriately.  HEENT:  Head atraumatic, normocephalic. Eyes, pupils equal, reacting to light. Extraocular movements intact. No scleral icterus. No conjunctivitis. Hearing is intact. Tympanic membranes clear. Mucous membranes are slightly dry. Dentition is good.  NECK: Thyroid nontender, nonenlarged. Supple.  No masses. Nontender. No lymphadenopathy. No JVD. No carotid bruit.  RESPIRATORY: Bilaterally clear to auscultation. No wheeze. No rales.  CARDIOVASCULAR: S1, S2 regular. No murmurs. The patient's PMI not displaced. No extremity edema. Has left AKA  present and on the right side no edema.  Good femoral pulse present.  ABDOMEN: Soft, nontender, obese. Bowel sounds present. No organomegaly. No hernias.  MUSCULOSKELETAL: The patient's strength 5/5 upper extremities and right leg 5/5 strength.  Left AKA present.  SKIN: No skin rashes. No erythema, no nodules.  NEUROLOGIC: Cranial nerves II through XII intact. DTRs 2 + bilaterally,  sensations are intact, no dysarthria or dysphagia.   PSYCHIATRIC: Oriented x 3. Cooperative and memory is intact.   LABORATORY DATA:  1.  CT  head shows no acute abnormality, chronic small vessel ischemic changes,2   , sodium 138, potassium 4.2, chloride 101, bicarbonate 27, BUN 31, creatinine 1.8, serum glucose 119.  3.  Troponins are 0.03.  4.  WBC 14.6, hemoglobin 11.3, hematocrit 33.8, platelets 149,000.  5.  Chest x-ray shows small bilateral pleural effusions, indistinct basilar opacity.  6.  UA is turbid with 913 WBC, 3 + leukocyte esterase and bacteria 3 +.   ASSESSMENT AND PLAN: A 79 year old female with confusion, increased weakness likely secondary to urinary tract infection, admitted to medical service, started on Rocephin. The patient also has small pneumonia with hypoxia.    1.  The patient has urinary tract infection and pneumonia. Admit her to hospitalist service. Start on vancomycin and Levaquin.  2.  Dehydration with generalized weakness. Hold Lasix, HCTZ with lisinopril, and started on IV fluids. The patient was hypotensive initially, after the fluid bolus blood sugar improved, so we are going to continue IV fluids, normal saline at 80 mL an hour, and hold her HCTZ and metoprolol .  3.  Peripheral vascular disease.  She is on Plavix, continue that.  4.  Hyperlipidemia with peripheral vascular disease.  Continue simvastatin.  5.  The patient has mild thrombocytopenia, watch platelets closely.  6.  Deep vein thrombosis prophylaxis with Lovenox 40 mg subcutaneous daily.  The patient is wheelchair-bound. If she feels stronger she can use her wheelchair at home and possible discharge after urine culture results are available.  7.  Regarding diabetes, the patient stopped on metformin and metformin was taken off 2 months ago. The patient not taking any diabetic medications.  We will do sliding scale with coverage.   TIME SPENT: More than 55 minutes.    ____________________________ Epifanio Lesches, MD sk:bu D: 08/15/2014 16:35:56 ET T: 08/15/2014 19:15:25 ET JOB#: [id number]  cc: Epifanio Lesches, MD,  <Dictator> Epifanio Lesches MD ELECTRONICALLY SIGNED 08/25/2014 16:18

## 2014-10-01 DIAGNOSIS — G546 Phantom limb syndrome with pain: Secondary | ICD-10-CM | POA: Diagnosis not present

## 2014-10-01 DIAGNOSIS — I10 Essential (primary) hypertension: Secondary | ICD-10-CM | POA: Diagnosis not present

## 2014-10-07 DIAGNOSIS — N2581 Secondary hyperparathyroidism of renal origin: Secondary | ICD-10-CM | POA: Diagnosis not present

## 2014-10-07 DIAGNOSIS — R809 Proteinuria, unspecified: Secondary | ICD-10-CM | POA: Diagnosis not present

## 2014-10-07 DIAGNOSIS — E1122 Type 2 diabetes mellitus with diabetic chronic kidney disease: Secondary | ICD-10-CM | POA: Diagnosis not present

## 2014-10-07 DIAGNOSIS — I10 Essential (primary) hypertension: Secondary | ICD-10-CM | POA: Diagnosis not present

## 2014-10-07 DIAGNOSIS — N183 Chronic kidney disease, stage 3 (moderate): Secondary | ICD-10-CM | POA: Diagnosis not present

## 2014-10-07 DIAGNOSIS — N390 Urinary tract infection, site not specified: Secondary | ICD-10-CM | POA: Diagnosis not present

## 2014-10-11 ENCOUNTER — Other Ambulatory Visit: Payer: Self-pay | Admitting: Nephrology

## 2014-10-11 DIAGNOSIS — N183 Chronic kidney disease, stage 3 (moderate): Secondary | ICD-10-CM

## 2014-10-14 DIAGNOSIS — N390 Urinary tract infection, site not specified: Secondary | ICD-10-CM | POA: Diagnosis not present

## 2014-10-15 ENCOUNTER — Other Ambulatory Visit: Payer: Self-pay | Admitting: Nephrology

## 2014-10-15 DIAGNOSIS — N183 Chronic kidney disease, stage 3 (moderate): Secondary | ICD-10-CM

## 2014-10-15 DIAGNOSIS — N2889 Other specified disorders of kidney and ureter: Secondary | ICD-10-CM

## 2014-10-18 ENCOUNTER — Ambulatory Visit
Admission: RE | Admit: 2014-10-18 | Discharge: 2014-10-18 | Disposition: A | Discharge status: Home / Self Care | Payer: Commercial Managed Care - HMO | Source: Ambulatory Visit | Attending: Nephrology | Admitting: Nephrology

## 2014-10-18 DIAGNOSIS — N2889 Other specified disorders of kidney and ureter: Secondary | ICD-10-CM | POA: Insufficient documentation

## 2014-10-18 DIAGNOSIS — N289 Disorder of kidney and ureter, unspecified: Secondary | ICD-10-CM | POA: Diagnosis not present

## 2014-10-18 DIAGNOSIS — N183 Chronic kidney disease, stage 3 (moderate): Secondary | ICD-10-CM | POA: Diagnosis not present

## 2014-10-18 DIAGNOSIS — N19 Unspecified kidney failure: Secondary | ICD-10-CM | POA: Diagnosis not present

## 2014-11-05 ENCOUNTER — Ambulatory Visit (INDEPENDENT_AMBULATORY_CARE_PROVIDER_SITE_OTHER): Payer: Commercial Managed Care - HMO | Admitting: Family Medicine

## 2014-11-05 ENCOUNTER — Encounter: Payer: Self-pay | Admitting: Family Medicine

## 2014-11-05 VITALS — BP 110/60 | HR 65 | Resp 15

## 2014-11-05 DIAGNOSIS — G546 Phantom limb syndrome with pain: Secondary | ICD-10-CM

## 2014-11-05 DIAGNOSIS — F419 Anxiety disorder, unspecified: Secondary | ICD-10-CM | POA: Insufficient documentation

## 2014-11-05 DIAGNOSIS — E119 Type 2 diabetes mellitus without complications: Secondary | ICD-10-CM

## 2014-11-05 DIAGNOSIS — R7989 Other specified abnormal findings of blood chemistry: Secondary | ICD-10-CM

## 2014-11-05 DIAGNOSIS — R748 Abnormal levels of other serum enzymes: Secondary | ICD-10-CM | POA: Diagnosis not present

## 2014-11-05 MED ORDER — HYDROCODONE-ACETAMINOPHEN 7.5-325 MG PO TABS: 1.0000 | ORAL_TABLET | Freq: Three times a day (TID) | ORAL | Status: DC | PRN

## 2014-11-05 MED ORDER — ALPRAZOLAM 0.5 MG PO TABS: 0.5000 mg | ORAL_TABLET | Freq: Three times a day (TID) | ORAL | Status: DC | PRN

## 2014-11-05 NOTE — Progress Notes (Signed)
Name: Amy [name]   MRN: [id number]    DOB: 03-05-1936   Date:11/05/2014       Progress Note  Subjective  Chief Complaint  Chief Complaint  Patient presents with  . Pain    1 month chronic pain   . Anxiety    Anxiety Presents for follow-up visit. Symptoms include irritability (frustrated with her inability to walk which makes her anxious) and nervous/anxious behavior. Patient reports no chest pain, confusion, dizziness, excessive worry, insomnia, palpitations, panic or shortness of breath. Symptoms occur most days. The severity of symptoms is moderate.   Past treatments include benzodiazephines. The treatment provided significant relief. Compliance with prior treatments has been good.  Leg Pain   Pt. Is here for follow up on chronic phantom limb pain. Pain is rated at 6/10. She is on Hydrocodone-Acetaminophen 7.5-325 mg 1 tablet every 8 hours as needed. Reports good relief of pain. No reported adverse effects.    Past Medical History  Diagnosis Date  . Chronic kidney disease   . Hypertension   . Hyperlipidemia   . GERD (gastroesophageal reflux disease)   . Anxiety   . Depression     Past Surgical History  Procedure Laterality Date  . Leg amputation    . Abdominal hysterectomy  1978  . Cholecystectomy    . Carpal tunnel release Bilateral     Family History  Problem Relation Age of Onset  . Cancer Mother     Stomach  . Cancer Father   . Diabetes Brother   . Cancer Brother     History   Social History  . Marital Status: Married    Spouse Name: N/A  . Number of Children: N/A  . Years of Education: N/A   Occupational History  . Not on file.   Social History Main Topics  . Smoking status: Former Smoker    Quit date: 05/24/2004  . Smokeless tobacco: Never Used  . Alcohol Use: No  . Drug Use: No  . Sexual Activity: Not Currently   Other Topics Concern  . Not on file   Social History Narrative  . No narrative on file     Current outpatient  prescriptions:  .  ALPRAZolam (XANAX) 0.5 MG tablet, Take 1 tablet by mouth 3 (three) times daily., Disp: , Rfl:  .  citalopram (CELEXA) 20 MG tablet, Take 1 tablet by mouth daily., Disp: , Rfl:  .  clopidogrel (PLAVIX) 75 MG tablet, Take 1 tablet by mouth daily., Disp: , Rfl:  .  gabapentin (NEURONTIN) 300 MG capsule, Take 1 capsule by mouth 3 (three) times daily., Disp: , Rfl:  .  HYDROcodone-acetaminophen (NORCO) 7.5-325 MG per tablet, Take 1 tablet by mouth 3 (three) times daily as needed., Disp: , Rfl:  .  metoprolol (LOPRESSOR) 50 MG tablet, Take 1 tablet by mouth 2 (two) times daily., Disp: , Rfl:  .  pantoprazole (PROTONIX) 40 MG tablet, Take 1 tablet by mouth daily., Disp: , Rfl:  .  ranitidine (ZANTAC) 150 MG capsule, Take 1 capsule by mouth daily., Disp: , Rfl:  .  simvastatin (ZOCOR) 40 MG tablet, Take 1 tablet by mouth daily., Disp: , Rfl:  .  triamterene-hydrochlorothiazide (DYAZIDE) 37.5-25 MG per capsule, Take 1 capsule by mouth daily., Disp: , Rfl:   Allergies  Allergen Reactions  . Fentanyl Other (See Comments)    urine retention  . Penicillins      Review of Systems  Constitutional: Positive for irritability (frustrated with her inability  to walk which makes her anxious).  Respiratory: Negative for shortness of breath.   Cardiovascular: Negative for chest pain and palpitations.  Musculoskeletal: Negative for falls.       Positive for phantom limb pain  Neurological: Negative for dizziness.  Psychiatric/Behavioral: Negative for confusion. The patient is nervous/anxious. The patient does not have insomnia.       Objective  Filed Vitals:   11/05/14 1108  BP: 110/60  Pulse: 65  Resp: 15    Physical Exam  Constitutional: She is oriented to person, place, and time and well-developed, well-nourished, and in no distress.  HENT:  Head: Normocephalic and atraumatic.  Cardiovascular: Normal rate and regular rhythm.   Pulmonary/Chest: Effort normal and breath  sounds normal.  Musculoskeletal:       Legs: Left above-knee amputation.   Neurological: She is alert and oriented to person, place, and time.  Nursing note and vitals reviewed.         Assessment & Plan  1. Phantom limb syndrome with pain Pain is well controlled on present opioid therapy. Patient is compliant with controlled substances agreement. Refills provided. Follow-up in one month. - HYDROcodone-acetaminophen (NORCO) 7.5-325 MG per tablet; Take 1 tablet by mouth 3 (three) times daily as needed.  Dispense: 90 tablet; Refill: 0  2. Anxiety Symptoms controlled on present therapy. Refills provided. - ALPRAZolam (XANAX) 0.5 MG tablet; Take 1 tablet (0.5 mg total) by mouth 3 (three) times daily as needed for anxiety.  Dispense: 90 tablet; Refill: 0  3. Elevated serum creatinine Patient is being followed by nephrology.  4. Diabetes mellitus type 2, controlled  - HgB A1c  There are no diagnoses linked to this encounter.  Amy [name] [name] 11/05/2014 11:22 AM

## 2014-11-06 LAB — HEMOGLOBIN A1C
Est. average glucose Bld gHb Est-mCnc: 126 mg/dL
Hgb A1c MFr Bld: 6 % — ABNORMAL HIGH (ref 4.8–5.6)

## 2014-11-08 DIAGNOSIS — N183 Chronic kidney disease, stage 3 (moderate): Secondary | ICD-10-CM | POA: Diagnosis not present

## 2014-11-08 DIAGNOSIS — I10 Essential (primary) hypertension: Secondary | ICD-10-CM | POA: Diagnosis not present

## 2014-11-08 DIAGNOSIS — R809 Proteinuria, unspecified: Secondary | ICD-10-CM | POA: Diagnosis not present

## 2014-11-08 DIAGNOSIS — E1122 Type 2 diabetes mellitus with diabetic chronic kidney disease: Secondary | ICD-10-CM | POA: Diagnosis not present

## 2014-11-08 DIAGNOSIS — N2581 Secondary hyperparathyroidism of renal origin: Secondary | ICD-10-CM | POA: Diagnosis not present

## 2014-11-08 DIAGNOSIS — D631 Anemia in chronic kidney disease: Secondary | ICD-10-CM | POA: Diagnosis not present

## 2014-11-13 ENCOUNTER — Telehealth: Payer: Self-pay | Admitting: Family Medicine

## 2014-11-13 DIAGNOSIS — N2889 Other specified disorders of kidney and ureter: Secondary | ICD-10-CM

## 2014-11-13 NOTE — Telephone Encounter (Signed)
Does this patient needed a Urology referral?

## 2014-11-13 NOTE — Telephone Encounter (Signed)
[facility] is needing to refer Amy [name] to a urology for left kidney mass. If you have any questions please return there call at [phone]

## 2014-11-14 NOTE — Telephone Encounter (Signed)
Yes sir, that is what [facility] said

## 2014-11-20 ENCOUNTER — Ambulatory Visit (INDEPENDENT_AMBULATORY_CARE_PROVIDER_SITE_OTHER): Payer: Commercial Managed Care - HMO | Admitting: Urology

## 2014-11-20 ENCOUNTER — Encounter: Payer: Self-pay | Admitting: Urology

## 2014-11-20 VITALS — BP 137/76 | HR 53 | Ht 62.0 in

## 2014-11-20 DIAGNOSIS — Z87898 Personal history of other specified conditions: Secondary | ICD-10-CM | POA: Insufficient documentation

## 2014-11-20 DIAGNOSIS — J449 Chronic obstructive pulmonary disease, unspecified: Secondary | ICD-10-CM | POA: Insufficient documentation

## 2014-11-20 DIAGNOSIS — F1120 Opioid dependence, uncomplicated: Secondary | ICD-10-CM | POA: Insufficient documentation

## 2014-11-20 DIAGNOSIS — N183 Chronic kidney disease, stage 3 unspecified: Secondary | ICD-10-CM

## 2014-11-20 DIAGNOSIS — F411 Generalized anxiety disorder: Secondary | ICD-10-CM | POA: Insufficient documentation

## 2014-11-20 DIAGNOSIS — J45909 Unspecified asthma, uncomplicated: Secondary | ICD-10-CM | POA: Insufficient documentation

## 2014-11-20 DIAGNOSIS — J309 Allergic rhinitis, unspecified: Secondary | ICD-10-CM | POA: Insufficient documentation

## 2014-11-20 DIAGNOSIS — J988 Other specified respiratory disorders: Secondary | ICD-10-CM | POA: Insufficient documentation

## 2014-11-20 DIAGNOSIS — Z1389 Encounter for screening for other disorder: Secondary | ICD-10-CM | POA: Insufficient documentation

## 2014-11-20 DIAGNOSIS — R4182 Altered mental status, unspecified: Secondary | ICD-10-CM | POA: Insufficient documentation

## 2014-11-20 DIAGNOSIS — I739 Peripheral vascular disease, unspecified: Secondary | ICD-10-CM | POA: Insufficient documentation

## 2014-11-20 DIAGNOSIS — E785 Hyperlipidemia, unspecified: Secondary | ICD-10-CM | POA: Insufficient documentation

## 2014-11-20 DIAGNOSIS — M79606 Pain in leg, unspecified: Secondary | ICD-10-CM | POA: Insufficient documentation

## 2014-11-20 DIAGNOSIS — R5383 Other fatigue: Secondary | ICD-10-CM

## 2014-11-20 DIAGNOSIS — B379 Candidiasis, unspecified: Secondary | ICD-10-CM | POA: Insufficient documentation

## 2014-11-20 DIAGNOSIS — N3941 Urge incontinence: Secondary | ICD-10-CM

## 2014-11-20 DIAGNOSIS — M810 Age-related osteoporosis without current pathological fracture: Secondary | ICD-10-CM | POA: Insufficient documentation

## 2014-11-20 DIAGNOSIS — Z23 Encounter for immunization: Secondary | ICD-10-CM | POA: Insufficient documentation

## 2014-11-20 DIAGNOSIS — I700 Atherosclerosis of aorta: Secondary | ICD-10-CM | POA: Insufficient documentation

## 2014-11-20 DIAGNOSIS — M545 Low back pain, unspecified: Secondary | ICD-10-CM | POA: Insufficient documentation

## 2014-11-20 DIAGNOSIS — K219 Gastro-esophageal reflux disease without esophagitis: Secondary | ICD-10-CM | POA: Insufficient documentation

## 2014-11-20 DIAGNOSIS — R1030 Lower abdominal pain, unspecified: Secondary | ICD-10-CM | POA: Insufficient documentation

## 2014-11-20 DIAGNOSIS — I10 Essential (primary) hypertension: Secondary | ICD-10-CM | POA: Insufficient documentation

## 2014-11-20 DIAGNOSIS — R809 Proteinuria, unspecified: Secondary | ICD-10-CM | POA: Insufficient documentation

## 2014-11-20 DIAGNOSIS — N2889 Other specified disorders of kidney and ureter: Secondary | ICD-10-CM | POA: Insufficient documentation

## 2014-11-20 DIAGNOSIS — N390 Urinary tract infection, site not specified: Secondary | ICD-10-CM | POA: Insufficient documentation

## 2014-11-20 DIAGNOSIS — IMO0001 Reserved for inherently not codable concepts without codable children: Secondary | ICD-10-CM | POA: Insufficient documentation

## 2014-11-20 DIAGNOSIS — R300 Dysuria: Secondary | ICD-10-CM | POA: Insufficient documentation

## 2014-11-20 DIAGNOSIS — E559 Vitamin D deficiency, unspecified: Secondary | ICD-10-CM | POA: Insufficient documentation

## 2014-11-20 DIAGNOSIS — N760 Acute vaginitis: Secondary | ICD-10-CM | POA: Insufficient documentation

## 2014-11-20 DIAGNOSIS — K760 Fatty (change of) liver, not elsewhere classified: Secondary | ICD-10-CM | POA: Insufficient documentation

## 2014-11-20 DIAGNOSIS — H269 Unspecified cataract: Secondary | ICD-10-CM | POA: Insufficient documentation

## 2014-11-20 DIAGNOSIS — R031 Nonspecific low blood-pressure reading: Secondary | ICD-10-CM | POA: Insufficient documentation

## 2014-11-20 DIAGNOSIS — R296 Repeated falls: Secondary | ICD-10-CM | POA: Insufficient documentation

## 2014-11-20 DIAGNOSIS — R3915 Urgency of urination: Secondary | ICD-10-CM | POA: Insufficient documentation

## 2014-11-20 DIAGNOSIS — Z0000 Encounter for general adult medical examination without abnormal findings: Secondary | ICD-10-CM | POA: Insufficient documentation

## 2014-11-20 DIAGNOSIS — IMO0002 Reserved for concepts with insufficient information to code with codable children: Secondary | ICD-10-CM | POA: Insufficient documentation

## 2014-11-20 DIAGNOSIS — M25569 Pain in unspecified knee: Secondary | ICD-10-CM | POA: Insufficient documentation

## 2014-11-20 DIAGNOSIS — N184 Chronic kidney disease, stage 4 (severe): Secondary | ICD-10-CM | POA: Insufficient documentation

## 2014-11-20 DIAGNOSIS — G547 Phantom limb syndrome without pain: Secondary | ICD-10-CM | POA: Insufficient documentation

## 2014-11-20 DIAGNOSIS — R5381 Other malaise: Secondary | ICD-10-CM | POA: Insufficient documentation

## 2014-11-20 DIAGNOSIS — Z9181 History of falling: Secondary | ICD-10-CM | POA: Insufficient documentation

## 2014-11-20 NOTE — Progress Notes (Signed)
11/20/2014 5:33 PM   Amy [name] 1935/12/08 [id number]  Referring provider: Roselee Nova, MD [address] [place], [place] [address]  Chief Complaint  Patient presents with  . Other    Renal mass, Inhomogeneous solid 5.3 cm mass of left upper/mid kidney,     HPI: 79 yo F with incidental left renal mass who presents today for further workup. She was admitted to the hospital back in March 2016 with pneumonia, AMS, dehydration, urinary tract infection, and acute on chronic renal failure (up to 1.87 from baseline 1.1) who is found to have an incidental renal mass on abdominal ultrasound. Following discharge, she was referred to nephrology(Dr. [facility]) who ordered a follow-up abdominal MRI on 10/18/14 was performed without contrast which showed a 5.3 cm left mid upper pole renal mass turning for possible RCC. There was areas of hyperintensity which presumably could indicate necrosis, questionable involvement of the renal pelvis, and no clear fat plane between the distal pancreatic tail.  Patient denies family history of renal cell carcinoma.  No flank pain or gross hematuria.  She does have significant urinary symptoms at baseline including episodes of urge incontinence (wears diapers) as well as other times where she has difficulty starting her urinary stream.  Previous abdominal surgeries include laparoscopic cholecystectomy, abdominal hysterectomy.     PMH: Past Medical History  Diagnosis Date  . Chronic kidney disease   . Hypertension   . Hyperlipidemia   . GERD (gastroesophageal reflux disease)   . Anxiety   . Depression     Surgical History: Past Surgical History  Procedure Laterality Date  . Leg amputation    . Abdominal hysterectomy  1978  . Cholecystectomy    . Carpal tunnel release Bilateral     Home Medications:    Medication List       This list is accurate as of: 11/20/14  5:33 PM.  Always use your most recent med list.               ALPRAZolam 0.5 MG tablet  Commonly known as:  XANAX  Take 1 tablet (0.5 mg total) by mouth 3 (three) times daily as needed for anxiety.     citalopram 20 MG tablet  Commonly known as:  CELEXA  Take 1 tablet by mouth daily.     clopidogrel 75 MG tablet  Commonly known as:  PLAVIX  Take 1 tablet by mouth daily.     gabapentin 300 MG capsule  Commonly known as:  NEURONTIN  Take 1 capsule by mouth 3 (three) times daily.     HYDROcodone-acetaminophen 7.5-325 MG per tablet  Commonly known as:  NORCO  Take 1 tablet by mouth 3 (three) times daily as needed.     metoprolol 50 MG tablet  Commonly known as:  LOPRESSOR  Take 1 tablet by mouth 2 (two) times daily.     pantoprazole 40 MG tablet  Commonly known as:  PROTONIX  Take 1 tablet by mouth daily.     ranitidine 150 MG capsule  Commonly known as:  ZANTAC  Take 1 capsule by mouth daily.     simvastatin 40 MG tablet  Commonly known as:  ZOCOR  Take 1 tablet by mouth daily.     triamterene-hydrochlorothiazide 37.5-25 MG per capsule  Commonly known as:  DYAZIDE  Take 1 capsule by mouth daily.     VENTOLIN HFA 108 (90 BASE) MCG/ACT inhaler  Generic drug:  albuterol  Allergies:  Allergies  Allergen Reactions  . Fentanyl Other (See Comments)    urine retention  . Penicillins     Family History: Family History  Problem Relation Age of Onset  . Cancer Mother     Stomach  . Cancer Father   . Diabetes Brother   . Cancer Brother     Social History:  reports that she quit smoking about 10 years ago. She has never used smokeless tobacco. She reports that she does not drink alcohol or use illicit drugs.  ROS: Urological Symptom Review  Patient is experiencing the following symptoms: Frequent urination Burning/pain with urination Leakage of urine Urinary tract infection   Review of Systems  Gastrointestinal (upper)  : Negative for upper GI symptoms  Gastrointestinal (lower) : Negative for  lower GI symptoms  Constitutional : Negative for symptoms  Skin: Negative for skin symptoms  Eyes: Negative for eye symptoms  Ear/Nose/Throat : Negative for Ear/Nose/Throat symptoms  Hematologic/Lymphatic: Negative for Hematologic/Lymphatic symptoms  Cardiovascular : Leg swelling  Respiratory : Negative for respiratory symptoms  Endocrine: Excessive thirst  Musculoskeletal: Negative for musculoskeletal symptoms  Neurological: Negative for neurological symptoms  Psychologic: Depression Anxiety   Physical Exam: BP 137/76 mmHg  Pulse 53  Ht 5\' 2"  (1.575 m)  Constitutional:  Alert and oriented, No acute distress.  In a wheelchair. Left lower extremity absent. [name] HEENT: [place] AT, moist mucus membranes.  Trachea midline, no masses. Cardiovascular: No clubbing, cyanosis, or edema. Respiratory: Normal respiratory effort, no increased work of breathing. GI: Abdomen is soft, nontender, nondistended, no abdominal masses.  Laparoscopic scars well-healed. Obese abdomen. GU: No CVA tenderness. Skin: No rashes, bruises or suspicious lesions. Lymph: No cervical or inguinal adenopathy. Neurologic: Grossly intact, no focal deficits, moving all 4 extremities. Psychiatric: Normal mood and affect.  Laboratory Data: Lab Results  Component Value Date   WBC 5.7 08/17/2014   HGB 11.0* 08/17/2014   HCT 33.8* 08/17/2014   MCV 88 08/17/2014   PLT 120* 08/17/2014    Lab Results  Component Value Date   CREATININE 1.10* 08/18/2014     Lab Results  Component Value Date   HGBA1C 6.0* 11/05/2014    Urinalysis Unable to provide urine sample today  Pertinent Imaging: CLINICAL DATA: Renal failure, left renal mass on prior exam  EXAM: MRI ABDOMEN WITHOUT CONTRAST  TECHNIQUE: Multiplanar multisequence MR imaging was performed without the administration of intravenous contrast.  COMPARISON: Renal ultrasound 08/16/2014, CT abdomen 10/14/2007  FINDINGS: Lower chest:  Trace left pleural fluid or thickening.  Hepatobiliary: Post cholecystectomy. 9 mm distal common duct filling defect identified, with diffuse common duct ectasia measuring 0.9 cm with tapering to the ampulla. Possible additional 4 mm distal common duct filling defect just proximal to the ampulla image 21, series 2. No intrahepatic ductal dilatation gallbladder surgically absent.  Pancreas: Normal  Spleen: Normal  Adrenals/Urinary Tract: Adrenal glands are normal. Bilateral renal cortical cysts are identified, largest right mid kidney measures 6.6 cm.  There is a mixed signal intensity mass in the left upper/ mid kidney measuring 5.3 x 4.3 x 3.7 cm, with a few foci of internal T2 hyperintensity which could indicate necrosis. This appears to intimately involve the renal pelvis and without a clear fat plane at the distal pancreatic tail, for example image 14 series 3.  Stomach/Bowel: Stomach and bowel appear normal.  Vascular/Lymphatic: No aortic aneurysm. No lymphadenopathy.  Other: No ascites.  Musculoskeletal: Lower thoracic compression deformity noted. This is seen on prior dissimilar exam  05/16/2013.  IMPRESSION: Inhomogeneous solid 5.3 cm mass of left upper/mid kidney, highly suspicious for renal cell carcinoma, with probable renal pelvis invasion, and no clear fat plane visible between the mass and the adjacent pancreatic tail. Suboptimal evaluation due to lack of contrast. Urology consultation is recommended.  Electronically Signed: By: Conchita Paris M.D. On: 10/18/2014 14:05   Assessment & Plan:  79 year old female with 5.3 cm suspicious left renal mass concerning for renal cell carcinoma. This was incidentally discovered and she is otherwise asymptomatic. CXR reveals no evidence of obvious metastatic diease. She does a history of acute on chronic renal insufficiency but with a baseline creatinine of 1.1, diet controlled DM.   I have recommended further  workup with a dedicated renal protocol CT scan ideally with IV contrast for the purpose of surgical planning if her renal function can tolerate IV contrast load. BMP order today.  A solid renal mass raises the suspicion of primary renal malignancy.  We discussed this in detail and in regards to the spectrum of renal masses which includes cysts (pure cysts are considered benign), solid masses and everything in between. The risk of metastasis increases as the size of solid renal mass increases. In some cases and especially in patients of older age and multiple comorbidities a surveillance approach may be appropriate. The treatment of solid renal masses includes: surveillance, cryoablation (percutaneous and laparoscopic) in addition to partial and complete nephrectomy (each with option of laparoscopic, robotic and open depending on appropriateness). Furthermore, nephrectomy appears to be an independent risk factor for the development of chronic kidney disease suggesting that nephron sparing approaches should be implored whenever feasible. We reviewed these options in context of the patients current situation as well as the pros and cons of each.  Then the size and location of the mass, she is not a good candidate for cryotherapy.   We discussed that she will need a radical left nephrectomy, ideally minimally invasive approach which I discussed with her today.  Briefly discussed the surgery itself along with the postoperative course. We discussed  the implications of solitary kidney.   1. Left renal mass - CT Abdomen Pelvis W Wo Contrast; Future  2. Chronic kidney disease (CKD), stage III (moderate) - Basic metabolic panel  3. Urge incontinence of urine Will address at later date    Return in about 3 weeks (around 12/11/2014) for f/u CT scan.  Hollice Espy, MD  [facility] [address] [place], [place] [address] [phone]

## 2014-11-21 LAB — BASIC METABOLIC PANEL
BUN/Creatinine Ratio: 17 (ref 11–26)
BUN: 23 mg/dL (ref 8–27)
CO2: 27 mmol/L (ref 18–29)
CREATININE: 1.32 mg/dL — AB (ref 0.57–1.00)
Calcium: 9.2 mg/dL (ref 8.7–10.3)
Chloride: 100 mmol/L (ref 97–108)
GFR calc Af Amer: 45 mL/min/{1.73_m2} — ABNORMAL LOW (ref >59)
GFR calc non Af Amer: 39 mL/min/{1.73_m2} — ABNORMAL LOW (ref >59)
GLUCOSE: 89 mg/dL (ref 65–99)
POTASSIUM: 5 mmol/L (ref 3.5–5.2)
Sodium: 140 mmol/L (ref 134–144)

## 2014-11-22 DIAGNOSIS — I10 Essential (primary) hypertension: Secondary | ICD-10-CM | POA: Diagnosis not present

## 2014-11-22 DIAGNOSIS — I739 Peripheral vascular disease, unspecified: Secondary | ICD-10-CM | POA: Diagnosis not present

## 2014-11-22 DIAGNOSIS — E785 Hyperlipidemia, unspecified: Secondary | ICD-10-CM | POA: Diagnosis not present

## 2014-11-22 DIAGNOSIS — E119 Type 2 diabetes mellitus without complications: Secondary | ICD-10-CM | POA: Diagnosis not present

## 2014-11-29 ENCOUNTER — Ambulatory Visit
Admission: RE | Admit: 2014-11-29 | Discharge: 2014-11-29 | Disposition: A | Discharge status: Home / Self Care | Payer: Commercial Managed Care - HMO | Source: Ambulatory Visit | Attending: Urology | Admitting: Urology

## 2014-11-29 DIAGNOSIS — N2889 Other specified disorders of kidney and ureter: Secondary | ICD-10-CM

## 2014-11-29 DIAGNOSIS — K8050 Calculus of bile duct without cholangitis or cholecystitis without obstruction: Secondary | ICD-10-CM | POA: Insufficient documentation

## 2014-11-29 DIAGNOSIS — M5136 Other intervertebral disc degeneration, lumbar region: Secondary | ICD-10-CM | POA: Diagnosis not present

## 2014-11-29 DIAGNOSIS — I700 Atherosclerosis of aorta: Secondary | ICD-10-CM | POA: Insufficient documentation

## 2014-11-29 DIAGNOSIS — N289 Disorder of kidney and ureter, unspecified: Secondary | ICD-10-CM | POA: Diagnosis not present

## 2014-11-29 DIAGNOSIS — N281 Cyst of kidney, acquired: Secondary | ICD-10-CM | POA: Insufficient documentation

## 2014-11-29 HISTORY — DX: Type 2 diabetes mellitus without complications: E11.9

## 2014-11-29 MED ORDER — IOHEXOL 300 MG/ML  SOLN
80.0000 mL | Freq: Once | INTRAMUSCULAR | Status: AC | PRN
  Administered 2014-11-29: 80 mL via INTRAVENOUS

## 2014-12-03 ENCOUNTER — Telehealth: Payer: Self-pay | Admitting: Family Medicine

## 2014-12-03 ENCOUNTER — Telehealth: Payer: Self-pay | Admitting: Urology

## 2014-12-03 DIAGNOSIS — N2889 Other specified disorders of kidney and ureter: Secondary | ICD-10-CM

## 2014-12-03 NOTE — Telephone Encounter (Signed)
CT scan results reviewed with patient.  5.5 cm enhancing solid renal mass with suggestion of renal vein thrombosis.  She will need a nephrectomy which we discussed today on the phone.    Will arrange for follow/ ASAP referral to [facility] for surgical evaluation.    Hollice Espy, MD

## 2014-12-03 NOTE — Telephone Encounter (Signed)
Requesting refill on Tantotrazole 40mg . She is completely out. Requesting that a prescription (enough to last until her next appointment 12-05-14) to be sent to her local pharmacy [facility] and also another one send to [facility].

## 2014-12-04 ENCOUNTER — Telehealth: Payer: Self-pay

## 2014-12-04 NOTE — Telephone Encounter (Signed)
[facility] called and asked that I place a [employer] Referral for Dr. Alexis Frock since Dr. Erlene Quan is going out on [place]. This has been done for ICD-10: N28.89 Auth # [id number] Start - 12/06/2014 Expires - 06/04/2015

## 2014-12-05 ENCOUNTER — Ambulatory Visit (INDEPENDENT_AMBULATORY_CARE_PROVIDER_SITE_OTHER): Payer: Commercial Managed Care - HMO | Admitting: Family Medicine

## 2014-12-05 ENCOUNTER — Encounter: Payer: Self-pay | Admitting: Family Medicine

## 2014-12-05 VITALS — BP 138/75 | HR 54 | Temp 97.7°F | Resp 18 | Ht 62.0 in

## 2014-12-05 DIAGNOSIS — G546 Phantom limb syndrome with pain: Secondary | ICD-10-CM

## 2014-12-05 DIAGNOSIS — F419 Anxiety disorder, unspecified: Secondary | ICD-10-CM | POA: Diagnosis not present

## 2014-12-05 DIAGNOSIS — N2889 Other specified disorders of kidney and ureter: Secondary | ICD-10-CM | POA: Insufficient documentation

## 2014-12-05 MED ORDER — ALPRAZOLAM 0.5 MG PO TABS: 0.5000 mg | ORAL_TABLET | Freq: Three times a day (TID) | ORAL | Status: DC | PRN

## 2014-12-05 MED ORDER — HYDROCODONE-ACETAMINOPHEN 7.5-325 MG PO TABS: 1.0000 | ORAL_TABLET | Freq: Three times a day (TID) | ORAL | Status: DC | PRN

## 2014-12-05 NOTE — Progress Notes (Signed)
Name: Amy [name]   MRN: [id number]    DOB: September 15, 1935   Date:12/05/2014       Progress Note  Subjective  Chief Complaint  Chief Complaint  Patient presents with  . Follow-up    4wk  . Hyperlipidemia  . Asthma    Anxiety Presents for follow-up visit. Symptoms include excessive worry and nervous/anxious behavior. Patient reports no insomnia, palpitations, panic or shortness of breath. The severity of symptoms is moderate.   Past treatments include benzodiazephines. The treatment provided moderate relief.  Leg Pain  The pain is present in the left leg. The quality of the pain is described as burning and shooting. The pain is mild. The pain has been fluctuating since onset.  Pt. Has chronic phantom limb pain since her left AKA in 2007. Stable on Hydrocodone-Acetaminophen 7.5-325 mg three times daily as needed. No side effects from Hydrocodone.   Past Medical History  Diagnosis Date  . Chronic kidney disease   . Hyperlipidemia   . GERD (gastroesophageal reflux disease)   . Anxiety   . Depression   . Diabetes mellitus without complication     Past Surgical History  Procedure Laterality Date  . Leg amputation    . Abdominal hysterectomy  1978  . Cholecystectomy    . Carpal tunnel release Bilateral     Family History  Problem Relation Age of Onset  . Cancer Mother     Stomach  . Cancer Father   . Diabetes Brother   . Cancer Brother     History   Social History  . Marital Status: Married    Spouse Name: N/A  . Number of Children: N/A  . Years of Education: N/A   Occupational History  . Not on file.   Social History Main Topics  . Smoking status: Former Smoker    Quit date: 05/24/2004  . Smokeless tobacco: Never Used  . Alcohol Use: No  . Drug Use: No  . Sexual Activity: Not Currently   Other Topics Concern  . Not on file   Social History Narrative     Current outpatient prescriptions:  .  ALPRAZolam (XANAX) 0.5 MG tablet, Take 1 tablet (0.5  mg total) by mouth 3 (three) times daily as needed for anxiety., Disp: 90 tablet, Rfl: 0 .  citalopram (CELEXA) 20 MG tablet, Take 1 tablet by mouth daily., Disp: , Rfl:  .  clopidogrel (PLAVIX) 75 MG tablet, Take 1 tablet by mouth daily., Disp: , Rfl:  .  gabapentin (NEURONTIN) 300 MG capsule, Take 1 capsule by mouth 3 (three) times daily., Disp: , Rfl:  .  HYDROcodone-acetaminophen (NORCO) 7.5-325 MG per tablet, Take 1 tablet by mouth 3 (three) times daily as needed., Disp: 90 tablet, Rfl: 0 .  metoprolol (LOPRESSOR) 50 MG tablet, Take 1 tablet by mouth 2 (two) times daily., Disp: , Rfl:  .  pantoprazole (PROTONIX) 40 MG tablet, Take 1 tablet by mouth daily., Disp: , Rfl:  .  ranitidine (ZANTAC) 150 MG capsule, Take 1 capsule by mouth daily., Disp: , Rfl:  .  simvastatin (ZOCOR) 40 MG tablet, Take 1 tablet by mouth daily., Disp: , Rfl:  .  triamterene-hydrochlorothiazide (DYAZIDE) 37.5-25 MG per capsule, Take 1 capsule by mouth daily., Disp: , Rfl:  .  VENTOLIN HFA 108 (90 BASE) MCG/ACT inhaler, , Disp: , Rfl:   Allergies  Allergen Reactions  . Fentanyl Other (See Comments)    urine retention  . Penicillins      Review  of Systems  Respiratory: Negative for shortness of breath.   Cardiovascular: Negative for palpitations.  Musculoskeletal: Positive for myalgias.  Psychiatric/Behavioral: The patient is nervous/anxious. The patient does not have insomnia.       Objective  Filed Vitals:   12/05/14 1123  BP: 138/75  Pulse: 54  Temp: 97.7 F (36.5 C)  TempSrc: Oral  Resp: 18  Height: 5\' 2"  (1.575 m)  SpO2: 91%    Physical Exam  Constitutional: She is well-developed, well-nourished, and in no distress.  HENT:  Head: Normocephalic and atraumatic.  Cardiovascular: Normal rate and regular rhythm.   Pulmonary/Chest: She has rales.  Abdominal: Soft.  Musculoskeletal:  Left leg AKA with subjective symptoms of pain and numbness experienced by the pt.  Neurological: She is  alert.  Skin: Skin is warm and dry.  Psychiatric: Memory, affect and judgment normal.  Nursing note and vitals reviewed.   Assessment & Plan 1. Anxiety Symptoms of anxiety are responsive to alprazolam 3 times a day when necessary. Patient is aware of the tolerance potential of benzodiazepines and their interaction with other drugs including opioids. She is taking the medication as directed. Refills provided and follow-up in one month. - ALPRAZolam (XANAX) 0.5 MG tablet; Take 1 tablet (0.5 mg total) by mouth 3 (three) times daily as needed for anxiety.  Dispense: 90 tablet; Refill: 0  2. Phantom limb syndrome with pain Symptoms responsive to daily opioid therapy. Patient is aware of the dependence potential of opioids and is taking the medication as needed and as directed. Refills provided and follow-up in one month - HYDROcodone-acetaminophen (NORCO) 7.5-325 MG per tablet; Take 1 tablet by mouth 3 (three) times daily as needed.  Dispense: 90 tablet; Refill: 0  3. Renal mass Patient is scheduled to see urology for evaluation of renal mass.   Latayvia Mandujano Asad A. [facility] 12/05/2014 11:56 AM

## 2014-12-05 NOTE — Telephone Encounter (Signed)
Spoke with patient concerning her medication refills while in office, her medication will be filled today at visit.

## 2014-12-06 DIAGNOSIS — N2889 Other specified disorders of kidney and ureter: Secondary | ICD-10-CM | POA: Diagnosis not present

## 2014-12-10 ENCOUNTER — Other Ambulatory Visit: Payer: Self-pay | Admitting: Urology

## 2014-12-12 ENCOUNTER — Ambulatory Visit: Payer: Commercial Managed Care - HMO | Admitting: Urology

## 2014-12-17 ENCOUNTER — Other Ambulatory Visit: Payer: Self-pay | Admitting: Urology

## 2014-12-20 ENCOUNTER — Telehealth: Payer: Self-pay | Admitting: Family Medicine

## 2014-12-20 MED ORDER — PANTOPRAZOLE SODIUM 40 MG PO TBEC: 40.0000 mg | DELAYED_RELEASE_TABLET | Freq: Every day | ORAL | Status: DC

## 2014-12-20 NOTE — Telephone Encounter (Signed)
Patient had called the other week for a refill on Pantoprazloe 40mg . She called the pharmacy and they had no record of it being done. Is it possible for you to refill this prescription and send it to [facility]. She has been completely out for about 3weeks. Thank you

## 2014-12-20 NOTE — Telephone Encounter (Signed)
Medication has been refilled and sent to [facility]

## 2014-12-23 ENCOUNTER — Other Ambulatory Visit: Payer: Self-pay | Admitting: Family Medicine

## 2014-12-31 ENCOUNTER — Ambulatory Visit: Payer: Commercial Managed Care - HMO | Admitting: Family Medicine

## 2015-01-02 ENCOUNTER — Encounter: Payer: Self-pay | Admitting: Family Medicine

## 2015-01-02 ENCOUNTER — Ambulatory Visit (INDEPENDENT_AMBULATORY_CARE_PROVIDER_SITE_OTHER): Payer: Commercial Managed Care - HMO | Admitting: Family Medicine

## 2015-01-02 ENCOUNTER — Ambulatory Visit
Admission: RE | Admit: 2015-01-02 | Discharge: 2015-01-02 | Disposition: A | Discharge status: Home / Self Care | Payer: Commercial Managed Care - HMO | Source: Ambulatory Visit | Attending: Family Medicine | Admitting: Family Medicine

## 2015-01-02 VITALS — BP 108/68 | HR 51 | Temp 98.0°F | Resp 19 | Ht 62.0 in

## 2015-01-02 DIAGNOSIS — F419 Anxiety disorder, unspecified: Secondary | ICD-10-CM | POA: Diagnosis not present

## 2015-01-02 DIAGNOSIS — E119 Type 2 diabetes mellitus without complications: Secondary | ICD-10-CM

## 2015-01-02 DIAGNOSIS — J449 Chronic obstructive pulmonary disease, unspecified: Secondary | ICD-10-CM | POA: Diagnosis not present

## 2015-01-02 DIAGNOSIS — Z01818 Encounter for other preprocedural examination: Secondary | ICD-10-CM

## 2015-01-02 DIAGNOSIS — J45909 Unspecified asthma, uncomplicated: Secondary | ICD-10-CM | POA: Diagnosis not present

## 2015-01-02 DIAGNOSIS — R9431 Abnormal electrocardiogram [ECG] [EKG]: Secondary | ICD-10-CM

## 2015-01-02 LAB — POCT GLYCOSYLATED HEMOGLOBIN (HGB A1C): Hemoglobin A1C: 6

## 2015-01-02 MED ORDER — ALPRAZOLAM 0.5 MG PO TABS: 0.5000 mg | ORAL_TABLET | Freq: Three times a day (TID) | ORAL | Status: DC | PRN

## 2015-01-02 NOTE — Progress Notes (Signed)
Name: Amy [name]   MRN: [id number]    DOB: Sep 10, 1935   Date:01/02/2015       Progress Note  Subjective  Chief Complaint  Chief Complaint  Patient presents with  . Follow-up    4 wk. Surgicial Clearance  . Diabetes  . Hyperlipidemia  . Asthma    HPI Pt. Is here for surgical clearance for her upcoming Laparoscopic Left Nephrectomy. She has been diagnosed with left renal mass, suspected to be malignant. Surgery is scheduled for August 24th, 2016 by Dr. Tammi Klippel in [facility]. Pt. Has history of atherosclerotic PVD, and has stents in her right leg (managed by Dr. Lucky Cowboy) but no history of Heart or Lung disease.  She tells me that she had no problems with her previous surgeries but after one of the surgeries, she was in the recovery area and could hear the staff call on her repeatedly to 'wake up!'  Past Medical History  Diagnosis Date  . Chronic kidney disease   . Hyperlipidemia   . GERD (gastroesophageal reflux disease)   . Anxiety   . Depression   . Diabetes mellitus without complication     Past Surgical History  Procedure Laterality Date  . Leg amputation    . Abdominal hysterectomy  1978  . Cholecystectomy    . Carpal tunnel release Bilateral     Family History  Problem Relation Age of Onset  . Cancer Mother     Stomach  . Cancer Father   . Diabetes Brother   . Cancer Brother     Social History   Social History  . Marital Status: Married    Spouse Name: N/A  . Number of Children: N/A  . Years of Education: N/A   Occupational History  . Not on file.   Social History Main Topics  . Smoking status: Former Smoker    Quit date: 05/24/2004  . Smokeless tobacco: Never Used  . Alcohol Use: No  . Drug Use: No  . Sexual Activity: Not Currently   Other Topics Concern  . Not on file   Social History Narrative     Current outpatient prescriptions:  .  ALPRAZolam (XANAX) 0.5 MG tablet, Take 1 tablet (0.5 mg total) by mouth 3 (three) times  daily as needed for anxiety., Disp: 90 tablet, Rfl: 0 .  citalopram (CELEXA) 20 MG tablet, Take 1 tablet by mouth daily., Disp: , Rfl:  .  clopidogrel (PLAVIX) 75 MG tablet, Take 1 tablet by mouth daily., Disp: , Rfl:  .  gabapentin (NEURONTIN) 300 MG capsule, Take 1 capsule by mouth 3 (three) times daily., Disp: , Rfl:  .  HYDROcodone-acetaminophen (NORCO) 7.5-325 MG per tablet, Take 1 tablet by mouth 3 (three) times daily as needed., Disp: 90 tablet, Rfl: 0 .  metoprolol (LOPRESSOR) 50 MG tablet, TAKE 1 TABLET TWICE DAILY, Disp: 180 tablet, Rfl: 0 .  pantoprazole (PROTONIX) 40 MG tablet, Take 1 tablet (40 mg total) by mouth daily., Disp: 30 tablet, Rfl: 0 .  ranitidine (ZANTAC) 150 MG capsule, Take 1 capsule by mouth daily., Disp: , Rfl:  .  simvastatin (ZOCOR) 40 MG tablet, Take 1 tablet by mouth daily., Disp: , Rfl:  .  triamterene-hydrochlorothiazide (DYAZIDE) 37.5-25 MG per capsule, Take 1 capsule by mouth daily., Disp: , Rfl:  .  VENTOLIN HFA 108 (90 BASE) MCG/ACT inhaler, , Disp: , Rfl:   Allergies  Allergen Reactions  . Fentanyl Other (See Comments)    urine retention  .  Penicillins      Review of Systems  Constitutional: Negative for fever, chills, weight loss and malaise/fatigue.  Eyes: Negative for blurred vision and double vision.  Respiratory: Negative for cough, sputum production and shortness of breath.   Cardiovascular: Negative for chest pain, palpitations and leg swelling.  Gastrointestinal: Negative for heartburn, nausea, vomiting and abdominal pain.  Genitourinary: Positive for urgency and flank pain (occasional left sided flank pain.). Negative for dysuria and frequency.  Musculoskeletal: Positive for back pain (back pain upon waking up every morning, which resolves.). Negative for neck pain.  Neurological: Negative for dizziness and headaches.      Objective  Filed Vitals:   01/02/15 1116  BP: 108/68  Pulse: 51  Temp: 98 F (36.7 C)  TempSrc: Oral   Resp: 19  Height: 5\' 2"  (1.575 m)  SpO2: 94%    Physical Exam  Constitutional: She is oriented to person, place, and time and well-developed, well-nourished, and in no distress.  Cardiovascular: Normal rate and regular rhythm.   Pulmonary/Chest: Effort normal and breath sounds normal.  Abdominal: Soft. Bowel sounds are normal.  Musculoskeletal: Normal range of motion.  Left AKA.  Neurological: She is alert and oriented to person, place, and time.  Skin: Skin is warm and dry.  Psychiatric: Memory, affect and judgment normal.  Nursing note and vitals reviewed.     Assessment & Plan  1. Type 2 diabetes mellitus without complication  - POCT HgB A1C - Hemoglobin A1c  2. Preoperative clearance Spoke with Dr. Lucky Cowboy ( vascular surgery) and confirmed that it is okay to hold Plavix starting 7 days prior to surgery. Obtain routine preoperative studies and follow-up. - CBC with Differential - Comprehensive metabolic panel - DG Chest 2 View; Future - EKG 12-Lead  3. Abnormal EKG EKG shows prolonged QT interval, which is  A change from her last EKG. Patient will be referred to cardiology to obtain cardiac clearance before proceeding with surgery. - Ambulatory referral to Cardiology  4. Anxiety chest - ALPRAZolam (XANAX) 0.5 MG tablet; Take 1 tablet (0.5 mg total) by mouth 3 (three) times daily as needed for anxiety.  Dispense: 90 tablet; Refill: 0   Carlas Vandyne Asad A. [facility] 01/02/2015 11:39 AM

## 2015-01-03 LAB — CBC WITH DIFFERENTIAL/PLATELET
BASOS: 1 %
Basophils Absolute: 0.1 10*3/uL (ref 0.0–0.2)
EOS (ABSOLUTE): 0.9 10*3/uL — AB (ref 0.0–0.4)
Eos: 11 %
Hematocrit: 35.3 % (ref 34.0–46.6)
Hemoglobin: 11.7 g/dL (ref 11.1–15.9)
IMMATURE GRANS (ABS): 0 10*3/uL (ref 0.0–0.1)
Immature Granulocytes: 0 %
Lymphocytes Absolute: 2.1 10*3/uL (ref 0.7–3.1)
Lymphs: 26 %
MCH: 28.7 pg (ref 26.6–33.0)
MCHC: 33.1 g/dL (ref 31.5–35.7)
MCV: 87 fL (ref 79–97)
MONOS ABS: 0.7 10*3/uL (ref 0.1–0.9)
Monocytes: 9 %
Neutrophils Absolute: 4.3 10*3/uL (ref 1.4–7.0)
Neutrophils: 53 %
PLATELETS: 192 10*3/uL (ref 150–379)
RBC: 4.08 x10E6/uL (ref 3.77–5.28)
RDW: 16.2 % — ABNORMAL HIGH (ref 12.3–15.4)
WBC: 8.1 10*3/uL (ref 3.4–10.8)

## 2015-01-03 LAB — COMPREHENSIVE METABOLIC PANEL
ALBUMIN: 3.8 g/dL (ref 3.5–4.8)
ALK PHOS: 48 IU/L (ref 39–117)
ALT: 5 IU/L (ref 0–32)
AST: 14 IU/L (ref 0–40)
Albumin/Globulin Ratio: 1.6 (ref 1.1–2.5)
BUN / CREAT RATIO: 13 (ref 11–26)
BUN: 18 mg/dL (ref 8–27)
Bilirubin Total: 1 mg/dL (ref 0.0–1.2)
CO2: 28 mmol/L (ref 18–29)
CREATININE: 1.38 mg/dL — AB (ref 0.57–1.00)
Calcium: 9.3 mg/dL (ref 8.7–10.3)
Chloride: 91 mmol/L — ABNORMAL LOW (ref 97–108)
GFR calc Af Amer: 43 mL/min/{1.73_m2} — ABNORMAL LOW (ref >59)
GFR calc non Af Amer: 37 mL/min/{1.73_m2} — ABNORMAL LOW (ref >59)
GLOBULIN, TOTAL: 2.4 g/dL (ref 1.5–4.5)
GLUCOSE: 79 mg/dL (ref 65–99)
Potassium: 4.9 mmol/L (ref 3.5–5.2)
Sodium: 133 mmol/L — ABNORMAL LOW (ref 134–144)
TOTAL PROTEIN: 6.2 g/dL (ref 6.0–8.5)

## 2015-01-03 LAB — HEMOGLOBIN A1C
Est. average glucose Bld gHb Est-mCnc: 123 mg/dL
Hgb A1c MFr Bld: 5.9 % — ABNORMAL HIGH (ref 4.8–5.6)

## 2015-01-07 ENCOUNTER — Ambulatory Visit (INDEPENDENT_AMBULATORY_CARE_PROVIDER_SITE_OTHER): Payer: Commercial Managed Care - HMO | Admitting: Cardiovascular Disease

## 2015-01-07 ENCOUNTER — Telehealth: Payer: Self-pay | Admitting: Family Medicine

## 2015-01-07 ENCOUNTER — Encounter: Payer: Self-pay | Admitting: Cardiovascular Disease

## 2015-01-07 VITALS — BP 108/50 | HR 60 | Ht 62.0 in | Wt 180.0 lb

## 2015-01-07 DIAGNOSIS — Z01818 Encounter for other preprocedural examination: Secondary | ICD-10-CM | POA: Diagnosis not present

## 2015-01-07 DIAGNOSIS — R9431 Abnormal electrocardiogram [ECG] [EKG]: Secondary | ICD-10-CM

## 2015-01-07 DIAGNOSIS — Z01810 Encounter for preprocedural cardiovascular examination: Secondary | ICD-10-CM | POA: Diagnosis not present

## 2015-01-07 NOTE — Assessment & Plan Note (Signed)
The patient has no previous cardiac history. However, she has multiple risk factors for coronary artery disease and established history of peripheral arterial disease. Her functional capacity is poor due to previous left above-the-knee amputation. Her cardiac exam reveals no murmurs. However, EKG is suggestive of anterior wall ischemia. Thus, I recommend a pharmacologic nuclear stress test before surgery to exclude high-risk ischemia. The patient is not able to undergo a treadmill stress test due to previous amputation.

## 2015-01-07 NOTE — Progress Notes (Signed)
Primary care physician: Dr. Keith Rake  HPI  This is a 79 year old female who was referred for preoperative cardiovascular evaluation for laparoscopic left nephrectomy for suspected cancer. She has no previous cardiac history. However, she has multiple medical problems that include peripheral arterial disease status post above-the-knee amputation on the left and stents in her right legs, diet-controlled diabetes, hypertension, hyperlipidemia and obesity. The patient is mostly wheelchair-bound with poor functional capacity. She denies chest pain or significant dyspnea. Recent ECG showed sinus rhythm with anterior T-wave changes and prolonged QT interval.    Allergies  Allergen Reactions  . Fentanyl Other (See Comments)    urine retention  . Penicillins      Current Outpatient Prescriptions on File Prior to Visit  Medication Sig Dispense Refill  . ALPRAZolam (XANAX) 0.5 MG tablet Take 1 tablet (0.5 mg total) by mouth 3 (three) times daily as needed for anxiety. 90 tablet 0  . citalopram (CELEXA) 20 MG tablet Take 1 tablet by mouth daily.    . clopidogrel (PLAVIX) 75 MG tablet Take 1 tablet by mouth daily.    [name] gabapentin (NEURONTIN) 300 MG capsule Take 1 capsule by mouth 3 (three) times daily.    [name] HYDROcodone-acetaminophen (NORCO) 7.5-325 MG per tablet Take 1 tablet by mouth 3 (three) times daily as needed. 90 tablet 0  . metoprolol (LOPRESSOR) 50 MG tablet TAKE 1 TABLET TWICE DAILY 180 tablet 0  . pantoprazole (PROTONIX) 40 MG tablet Take 1 tablet (40 mg total) by mouth daily. 30 tablet 0  . ranitidine (ZANTAC) 150 MG capsule Take 1 capsule by mouth daily.    . simvastatin (ZOCOR) 40 MG tablet Take 1 tablet by mouth daily.    [name] triamterene-hydrochlorothiazide (DYAZIDE) 37.5-25 MG per capsule Take 1 capsule by mouth daily.    . VENTOLIN HFA 108 (90 BASE) MCG/ACT inhaler      No current facility-administered medications on file prior to visit.     Past Medical History  Diagnosis  Date  . Chronic kidney disease   . Hyperlipidemia   . GERD (gastroesophageal reflux disease)   . Anxiety   . Depression   . Diabetes mellitus without complication      Past Surgical History  Procedure Laterality Date  . Leg amputation    . Abdominal hysterectomy  1978  . Cholecystectomy    . Carpal tunnel release Bilateral      Family History  Problem Relation Age of Onset  . Cancer Mother     Stomach  . Cancer Father   . Diabetes Brother   . Cancer Brother      Social History   Social History  . Marital Status: Married    Spouse Name: N/A  . Number of Children: N/A  . Years of Education: N/A   Occupational History  . Not on file.   Social History Main Topics  . Smoking status: Former Smoker    Quit date: 05/24/2004  . Smokeless tobacco: Never Used  . Alcohol Use: No  . Drug Use: No  . Sexual Activity: Not Currently   Other Topics Concern  . Not on file   Social History Narrative     ROS A 10 point review of system was performed. It is negative other than that mentioned in the history of present illness.   PHYSICAL EXAM   BP 108/50 mmHg  Pulse 60  Ht 5\' 2"  (1.575 m)  Wt 180 lb (81.647 kg)  BMI 32.91 kg/m2 Constitutional: She is oriented  to person, place, and time. She appears well-developed and well-nourished. No distress.  HENT: No nasal discharge.  Head: Normocephalic and atraumatic.  Eyes: Pupils are equal and round. No discharge.  Neck: Normal range of motion. Neck supple. No JVD present. No thyromegaly present.  Cardiovascular: Normal rate, regular rhythm, normal heart sounds. Exam reveals no gallop and no friction rub. No murmur heard.  Pulmonary/Chest: Effort normal and breath sounds normal. No stridor. No respiratory distress. She has no wheezes. She has no rales. She exhibits no tenderness.  Abdominal: Soft. Bowel sounds are normal. She exhibits no distension. There is no tenderness. There is no rebound and no guarding.    Musculoskeletal: Normal range of motion. She exhibits no edema and no tenderness.  Neurological: She is alert and oriented to person, place, and time. Coordination normal.  Skin: Skin is warm and dry. No rash noted. She is not diaphoretic. No erythema. No pallor.  Psychiatric: She has a normal mood and affect. Her behavior is normal. Judgment and thought content normal.     EKG: Recent EKG showed sinus rhythm with anterior T-wave changes suggestive of ischemia with prolonged QT interval   ASSESSMENT AND PLAN

## 2015-01-07 NOTE — Telephone Encounter (Signed)
PROVIDER IS ASKING FOR THE FORMS THAT HAVE BEEN FAXED TWO TIMES TO GET SURGICAL CLEARANCE. THE PATIENT WAS HER ON 01-02-15 BUT DID NOT RECEIVE THIS. PLEASE CALL AT [phone] EXT [phone]. FAX # IS [phone] IF A NOT E FOR THIS CAN BE DONE JUST FAX IT. SURGERY IS ON THE 24TH. PT HAS TO BE OFF MEDICATION FOR 7 DAYS.

## 2015-01-07 NOTE — Patient Instructions (Addendum)
Medication Instructions: - no changes  Labwork: - none  Procedures/Testing: - Your physician has requested that you have a lexiscan myoview. For further information please visit [email or web address]. Please follow instruction sheet, as given.  Follow-Up: - Dr. Fletcher Anon will see you back on an as needed basis pending the results of your stress test.  Any Additional Special Instructions Will Be Listed Below (If Applicable).   Alpha  Your caregiver has ordered a Stress Test with nuclear imaging. The purpose of this test is to evaluate the blood supply to your heart muscle. This procedure is referred to as a "Non-Invasive Stress Test." This is because other than having an IV started in your vein, nothing is inserted or "invades" your body. Cardiac stress tests are done to find areas of poor blood flow to the heart by determining the extent of coronary artery disease (CAD). Some patients exercise on a treadmill, which naturally increases the blood flow to your heart, while others who are  unable to walk on a treadmill due to physical limitations have a pharmacologic/chemical stress agent called Lexiscan . This medicine will mimic walking on a treadmill by temporarily increasing your coronary blood flow.   Please note: these test may take anywhere between 2-4 hours to complete  PLEASE REPORT TO [facility] MEDICAL MALL ENTRANCE  THE VOLUNTEERS AT THE FIRST DESK WILL DIRECT YOU WHERE TO GO  Date of Procedure:_________Thursday 8/18/16____________________________  Arrival Time for Procedure:______8:00 am________________________  Instructions regarding medication:   ____ : Hold diabetes medication morning of procedure  __x__:  Hold betablocker(s) night before procedure and morning of procedure (METOPROLOL)  ____:  Hold other medications as  follows:_________________________________________________________________________________________________________________________________________________________________________________________________________________________________________________________________________________________  PLEASE NOTIFY THE OFFICE AT LEAST 24 HOURS IN ADVANCE IF YOU ARE UNABLE TO KEEP YOUR APPOINTMENT.  [phone] AND  PLEASE NOTIFY NUCLEAR MEDICINE AT [facility] AT LEAST 24 HOURS IN ADVANCE IF YOU ARE UNABLE TO KEEP YOUR APPOINTMENT. [phone]  How to prepare for your Myoview test:  1. Do not eat or drink after midnight 2. No caffeine for 24 hours prior to test 3. No smoking 24 hours prior to test. 4. Your medication may be taken with water.  If your doctor stopped a medication because of this test, do not take that medication. 5. Ladies, please do not wear dresses.  Skirts or pants are appropriate. Please wear a short sleeve shirt. 6. No perfume, cologne or lotion. 7. Wear comfortable walking shoes. No heels!

## 2015-01-08 NOTE — Telephone Encounter (Signed)
Dr. Manuella Ghazi spoke with Provider on 01/02/2015 and informed provider that it was ok to hold back medication for 7 days prior to surgery, paper work was also faxed on 01/03/2015 and again on 01/07/2015 to fax number [phone], and several attempts to phone number [phone] Ext. [phone], 3 voicemail messages were left there has been no return call. Paper work has been faxed for Surgical Clearance.

## 2015-01-09 ENCOUNTER — Ambulatory Visit
Admission: RE | Admit: 2015-01-09 | Discharge: 2015-01-09 | Disposition: A | Discharge status: Home / Self Care | Payer: Commercial Managed Care - HMO | Source: Ambulatory Visit | Attending: Cardiovascular Disease | Admitting: Cardiovascular Disease

## 2015-01-09 DIAGNOSIS — Z01810 Encounter for preprocedural cardiovascular examination: Secondary | ICD-10-CM | POA: Diagnosis not present

## 2015-01-09 DIAGNOSIS — R9431 Abnormal electrocardiogram [ECG] [EKG]: Secondary | ICD-10-CM | POA: Diagnosis not present

## 2015-01-09 MED ORDER — TECHNETIUM TC 99M SESTAMIBI - CARDIOLITE
30.0200 | Freq: Once | INTRAVENOUS | Status: AC | PRN
  Administered 2015-01-09: 30.02 via INTRAVENOUS

## 2015-01-09 MED ORDER — REGADENOSON 0.4 MG/5ML IV SOLN
0.4000 mg | Freq: Once | INTRAVENOUS | Status: AC
  Administered 2015-01-09: 0.4 mg via INTRAVENOUS

## 2015-01-09 MED ORDER — TECHNETIUM TC 99M SESTAMIBI - CARDIOLITE
12.1600 | Freq: Once | INTRAVENOUS | Status: AC | PRN
  Administered 2015-01-09: 09:00:00 12.16 via INTRAVENOUS

## 2015-01-10 ENCOUNTER — Encounter (HOSPITAL_COMMUNITY)
Admission: RE | Admit: 2015-01-10 | Discharge: 2015-01-10 | Disposition: A | Discharge status: Home / Self Care | Payer: Commercial Managed Care - HMO | Source: Ambulatory Visit | Attending: Urology | Admitting: Urology

## 2015-01-10 ENCOUNTER — Encounter (HOSPITAL_COMMUNITY): Payer: Self-pay

## 2015-01-10 DIAGNOSIS — N2889 Other specified disorders of kidney and ureter: Secondary | ICD-10-CM | POA: Diagnosis not present

## 2015-01-10 DIAGNOSIS — Z01812 Encounter for preprocedural laboratory examination: Secondary | ICD-10-CM | POA: Insufficient documentation

## 2015-01-10 DIAGNOSIS — Z0183 Encounter for blood typing: Secondary | ICD-10-CM | POA: Insufficient documentation

## 2015-01-10 DIAGNOSIS — I823 Embolism and thrombosis of renal vein: Secondary | ICD-10-CM | POA: Diagnosis not present

## 2015-01-10 HISTORY — DX: Anesthesia of skin: R20.0

## 2015-01-10 HISTORY — DX: Paresthesia of skin: R20.2

## 2015-01-10 HISTORY — DX: Frequency of micturition: R35.0

## 2015-01-10 HISTORY — DX: Unspecified urinary incontinence: R32

## 2015-01-10 HISTORY — DX: Acute embolism and thrombosis of unspecified deep veins of unspecified lower extremity: I82.409

## 2015-01-10 HISTORY — DX: Bronchitis, not specified as acute or chronic: J40

## 2015-01-10 HISTORY — DX: Peripheral vascular disease, unspecified: I73.9

## 2015-01-10 HISTORY — DX: Personal history of urinary (tract) infections: Z87.440

## 2015-01-10 HISTORY — DX: Unspecified cataract: H26.9

## 2015-01-10 HISTORY — DX: Obesity, unspecified: E66.9

## 2015-01-10 LAB — NM MYOCAR MULTI W/SPECT W/WALL MOTION / EF
CHL CUP NUCLEAR SDS: 0
CHL CUP NUCLEAR SRS: 4
CHL CUP NUCLEAR SSS: 0
LV sys vol: 11 mL
LVDIAVOL: 43 mL
NUC STRESS TID: 0.87
Peak HR: 68 {beats}/min
Percent HR: 47 %
Rest HR: 55 {beats}/min

## 2015-01-10 LAB — CBC
HEMATOCRIT: 35.5 % — AB (ref 36.0–46.0)
HEMOGLOBIN: 11.5 g/dL — AB (ref 12.0–15.0)
MCH: 28.2 pg (ref 26.0–34.0)
MCHC: 32.4 g/dL (ref 30.0–36.0)
MCV: 87 fL (ref 78.0–100.0)
Platelets: 188 10*3/uL (ref 150–400)
RBC: 4.08 MIL/uL (ref 3.87–5.11)
RDW: 15.9 % — ABNORMAL HIGH (ref 11.5–15.5)
WBC: 7.4 10*3/uL (ref 4.0–10.5)

## 2015-01-10 LAB — BASIC METABOLIC PANEL
Anion gap: 8 (ref 5–15)
BUN: 17 mg/dL (ref 6–20)
CHLORIDE: 99 mmol/L — AB (ref 101–111)
CO2: 28 mmol/L (ref 22–32)
CREATININE: 1.26 mg/dL — AB (ref 0.44–1.00)
Calcium: 8.9 mg/dL (ref 8.9–10.3)
GFR calc Af Amer: 46 mL/min — ABNORMAL LOW (ref >60)
GFR calc non Af Amer: 40 mL/min — ABNORMAL LOW (ref >60)
Glucose, Bld: 111 mg/dL — ABNORMAL HIGH (ref 65–99)
Potassium: 3.9 mmol/L (ref 3.5–5.1)
Sodium: 135 mmol/L (ref 135–145)

## 2015-01-10 LAB — ABO/RH: ABO/RH(D): A POS

## 2015-01-10 NOTE — Progress Notes (Signed)
OV note per Dr Arida/Cardiology in epic and on chart 01/07/2015 EKG per epic 01/02/2015 CXR per epic 01/02/2015  Pt had stress test 8/18/l2016 results discussed

## 2015-01-10 NOTE — Progress Notes (Signed)
BMP results per epic per PAT visit 01/10/2015 sent to Dr Tresa Moore

## 2015-01-10 NOTE — Patient Instructions (Signed)
Amy [name]  01/10/2015   Your procedure is scheduled on: Wednesday January 15, 2015   Report to [facility] Main  Entrance take [place]  elevators to 3rd floor to  [place] at 10:45 AM.  Call this number if you have problems the morning of surgery [phone]   Remember: ONLY 1 PERSON MAY GO WITH YOU TO SHORT STAY TO GET  READY MORNING OF [place].  CLEAR LIQUID DIET DAY PRIOR TO SURGERY AND CAN CONTINUE TAKING clear liquids till 7:15 am day of surgery then nothing by mouth.               FOLLOW MD BOWEL PREP INSTRUCTION      Take these medicines the morning of surgery with A SIP OF WATER: Alprazolam (Xanax) if needed; Gabapentin (Neurontin); Hydrocodone-Acetminophen if needed; Metoprolol; Ventolin Inhaler if needed                                You may not have any metal on your body including hair pins and              piercings  Do not wear jewelry, make-up, lotions, powders or perfumes, deodorant             Do not wear nail polish.  Do not shave  48 hours prior to surgery.                Do not bring valuables to the hospital. [place].  Contacts, dentures or bridgework may not be worn into surgery.     Patients discharged the day of surgery will not be allowed to drive home.  Name and phone number of your driver:[name] Piotrowicz (husband) [phone]              [facility] - Preparing for Surgery Before surgery, you can play an important role.  Because skin is not sterile, your skin needs to be as free of germs as possible.  You can reduce the number of germs on your skin by washing with CHG (chlorahexidine gluconate) soap before surgery.  CHG is an antiseptic cleaner which kills germs and bonds with the skin to continue killing germs even after washing. Please DO NOT use if you have an allergy to CHG or antibacterial soaps.  If your skin becomes reddened/irritated stop using the CHG  and inform your nurse when you arrive at Short Stay. Do not shave (including legs and underarms) for at least 48 hours prior to the first CHG shower.  You may shave your face/neck. Please follow these instructions carefully:  1.  Shower with CHG Soap the night before surgery and the  morning of Surgery.  2.  If you choose to wash your hair, wash your hair first as usual with your  normal  shampoo.  3.  After you shampoo, rinse your hair and body thoroughly to remove the  shampoo.                           4.  Use CHG as you would any other liquid soap.  You can apply chg directly  to the skin and wash  Gently with a scrungie or clean washcloth.  5.  Apply the CHG Soap to your body ONLY FROM THE NECK DOWN.   Do not use on face/ open                           Wound or open sores. Avoid contact with eyes, ears mouth and genitals (private parts).                       Wash face,  Genitals (private parts) with your normal soap.             6.  Wash thoroughly, paying special attention to the area where your surgery  will be performed.  7.  Thoroughly rinse your body with warm water from the neck down.  8.  DO NOT shower/wash with your normal soap after using and rinsing off  the CHG Soap.                9.  Pat yourself dry with a clean towel.            10.  Wear clean pajamas.            11.  Place clean sheets on your bed the night of your first shower and do not  sleep with pets. Day of Surgery : Do not apply any lotions/deodorants the morning of surgery.  Please wear clean clothes to the hospital/surgery center.  FAILURE TO FOLLOW THESE INSTRUCTIONS MAY RESULT IN THE CANCELLATION OF YOUR SURGERY PATIENT SIGNATURE_________________________________  NURSE SIGNATURE__________________________________  ________________________________________________________________________    CLEAR LIQUID DIET   Foods Allowed                                                                      Foods Excluded  Coffee and tea, regular and decaf                             liquids that you cannot  Plain Jell-O in any flavor                                             see through such as: Fruit ices (not with fruit pulp)                                     milk, soups, orange juice  Iced Popsicles                                    All solid food Carbonated beverages, regular and diet                                    Cranberry, grape and apple juices Sports drinks like Gatorade Lightly seasoned clear broth or consume(fat free) Sugar, honey syrup  Sample Menu Breakfast                                Lunch                                     Supper Cranberry juice                    Beef broth                            Chicken broth Jell-O                                     Grape juice                           Apple juice Coffee or tea                        Jell-O                                      Popsicle                                                Coffee or tea                        Coffee or tea  _____________________________________________________________________

## 2015-01-10 NOTE — Progress Notes (Signed)
Spoke with Dr Delma Post / anesthesia in regards to pts H&P. No orders given. Anesthesia to see pt day of surgery.

## 2015-01-11 ENCOUNTER — Other Ambulatory Visit: Payer: Self-pay | Admitting: Family Medicine

## 2015-01-11 DIAGNOSIS — K219 Gastro-esophageal reflux disease without esophagitis: Secondary | ICD-10-CM

## 2015-01-13 ENCOUNTER — Telehealth: Payer: Self-pay | Admitting: Family Medicine

## 2015-01-13 ENCOUNTER — Telehealth: Payer: Self-pay | Admitting: *Deleted

## 2015-01-13 NOTE — Telephone Encounter (Signed)
[facility] called needing a cardiac clearance. She is having kidney surgery on Wed. 01/15/15. Please send stat or call nurse [place] [facility] [phone]

## 2015-01-13 NOTE — Telephone Encounter (Signed)
Nurse calling with fax number [phone]  Please fax as soon as possible.

## 2015-01-13 NOTE — Telephone Encounter (Signed)
Spoke with patient's husband and he is requesting a refill of Protonix 40 mg #90, which has already been sent to patient's pharmacy. Patient is scheduled for surgery on 01/15/2015

## 2015-01-13 NOTE — Telephone Encounter (Signed)
S/w [facility] for fax number.  Faxed clearance to [phone]

## 2015-01-13 NOTE — Telephone Encounter (Signed)
Pt's needs to ask a question about the removal of her left kidney that is scheduled for this Wed. 01/15/15.

## 2015-01-13 NOTE — Telephone Encounter (Signed)
Routed to Dr. Manuella Ghazi for patient advice

## 2015-01-15 ENCOUNTER — Inpatient Hospital Stay (HOSPITAL_COMMUNITY): Payer: Commercial Managed Care - HMO | Admitting: Anesthesiology

## 2015-01-15 ENCOUNTER — Encounter (HOSPITAL_COMMUNITY)
Admission: RE | Disposition: A | Discharge status: Home / Self Care | Payer: Self-pay | Source: Ambulatory Visit | Attending: Urology

## 2015-01-15 ENCOUNTER — Inpatient Hospital Stay (HOSPITAL_COMMUNITY)
Admission: RE | Admit: 2015-01-15 | Discharge: 2015-01-17 | DRG: 661 | Disposition: A | Discharge status: Home / Self Care | Payer: Commercial Managed Care - HMO | Source: Ambulatory Visit | Attending: Urology | Admitting: Urology

## 2015-01-15 ENCOUNTER — Encounter (HOSPITAL_COMMUNITY): Payer: Self-pay | Admitting: *Deleted

## 2015-01-15 DIAGNOSIS — E1140 Type 2 diabetes mellitus with diabetic neuropathy, unspecified: Secondary | ICD-10-CM | POA: Diagnosis not present

## 2015-01-15 DIAGNOSIS — Z86718 Personal history of other venous thrombosis and embolism: Secondary | ICD-10-CM

## 2015-01-15 DIAGNOSIS — I739 Peripheral vascular disease, unspecified: Secondary | ICD-10-CM | POA: Diagnosis not present

## 2015-01-15 DIAGNOSIS — Z79899 Other long term (current) drug therapy: Secondary | ICD-10-CM | POA: Diagnosis not present

## 2015-01-15 DIAGNOSIS — Z87891 Personal history of nicotine dependence: Secondary | ICD-10-CM | POA: Diagnosis not present

## 2015-01-15 DIAGNOSIS — Z89619 Acquired absence of unspecified leg above knee: Secondary | ICD-10-CM

## 2015-01-15 DIAGNOSIS — Z89612 Acquired absence of left leg above knee: Secondary | ICD-10-CM | POA: Diagnosis not present

## 2015-01-15 DIAGNOSIS — C642 Malignant neoplasm of left kidney, except renal pelvis: Secondary | ICD-10-CM | POA: Diagnosis not present

## 2015-01-15 DIAGNOSIS — D49519 Neoplasm of unspecified behavior of unspecified kidney: Secondary | ICD-10-CM | POA: Diagnosis present

## 2015-01-15 DIAGNOSIS — C779 Secondary and unspecified malignant neoplasm of lymph node, unspecified: Secondary | ICD-10-CM | POA: Diagnosis not present

## 2015-01-15 DIAGNOSIS — N2889 Other specified disorders of kidney and ureter: Secondary | ICD-10-CM | POA: Diagnosis not present

## 2015-01-15 DIAGNOSIS — I823 Embolism and thrombosis of renal vein: Secondary | ICD-10-CM | POA: Diagnosis not present

## 2015-01-15 DIAGNOSIS — N289 Disorder of kidney and ureter, unspecified: Secondary | ICD-10-CM | POA: Diagnosis not present

## 2015-01-15 DIAGNOSIS — J449 Chronic obstructive pulmonary disease, unspecified: Secondary | ICD-10-CM | POA: Diagnosis not present

## 2015-01-15 HISTORY — PX: ROBOT ASSISTED LAPAROSCOPIC NEPHRECTOMY: SHX5140

## 2015-01-15 LAB — GLUCOSE, CAPILLARY
GLUCOSE-CAPILLARY: 83 mg/dL (ref 65–99)
GLUCOSE-CAPILLARY: 137 mg/dL — AB (ref 65–99)

## 2015-01-15 LAB — TYPE AND SCREEN
ABO/RH(D): A POS
ANTIBODY SCREEN: NEGATIVE

## 2015-01-15 SURGERY — ROBOTIC ASSISTED LAPAROSCOPIC NEPHRECTOMY
Anesthesia: General | Laterality: Left

## 2015-01-15 MED ORDER — ALBUTEROL SULFATE (2.5 MG/3ML) 0.083% IN NEBU
INHALATION_SOLUTION | RESPIRATORY_TRACT | Status: AC
  Administered 2015-01-15: 2.5 mg
  Filled 2015-01-15: qty 3

## 2015-01-15 MED ORDER — PANTOPRAZOLE SODIUM 40 MG PO TBEC
40.0000 mg | DELAYED_RELEASE_TABLET | Freq: Every day | ORAL | Status: DC
  Administered 2015-01-16 – 2015-01-17 (×2): 40 mg via ORAL
  Filled 2015-01-15 (×2): qty 1

## 2015-01-15 MED ORDER — HYDROMORPHONE HCL 1 MG/ML IJ SOLN
0.5000 mg | INTRAMUSCULAR | Status: DC | PRN
  Administered 2015-01-15 – 2015-01-16 (×2): 1 mg via INTRAVENOUS
  Filled 2015-01-15 (×2): qty 1

## 2015-01-15 MED ORDER — GLYCOPYRROLATE 0.2 MG/ML IJ SOLN
INTRAMUSCULAR | Status: AC
  Filled 2015-01-15: qty 2

## 2015-01-15 MED ORDER — DEXTROSE-NACL 5-0.45 % IV SOLN
INTRAVENOUS | Status: DC
  Administered 2015-01-15: 50 mL/h via INTRAVENOUS

## 2015-01-15 MED ORDER — METOPROLOL TARTRATE 50 MG PO TABS
50.0000 mg | ORAL_TABLET | Freq: Two times a day (BID) | ORAL | Status: DC
  Administered 2015-01-15 – 2015-01-17 (×4): 50 mg via ORAL
  Filled 2015-01-15: qty 1
  Filled 2015-01-15 (×2): qty 2
  Filled 2015-01-15: qty 1

## 2015-01-15 MED ORDER — TRIAMTERENE-HCTZ 37.5-25 MG PO CAPS
1.0000 | ORAL_CAPSULE | Freq: Every day | ORAL | Status: DC
  Filled 2015-01-15: qty 1

## 2015-01-15 MED ORDER — HYDROMORPHONE HCL 1 MG/ML IJ SOLN
INTRAMUSCULAR | Status: DC | PRN
  Administered 2015-01-15 (×2): 0.5 mg via INTRAVENOUS

## 2015-01-15 MED ORDER — CEFAZOLIN SODIUM-DEXTROSE 2-3 GM-% IV SOLR
2.0000 g | Freq: Once | INTRAVENOUS | Status: AC
  Administered 2015-01-15: 2 g via INTRAVENOUS

## 2015-01-15 MED ORDER — MIDAZOLAM HCL 5 MG/5ML IJ SOLN
INTRAMUSCULAR | Status: DC | PRN
  Administered 2015-01-15: 2 mg via INTRAVENOUS

## 2015-01-15 MED ORDER — HYDROMORPHONE HCL 1 MG/ML IJ SOLN
INTRAMUSCULAR | Status: DC
Start: 2015-01-15 — End: 2015-01-15
  Filled 2015-01-15: qty 1

## 2015-01-15 MED ORDER — CEFAZOLIN SODIUM-DEXTROSE 2-3 GM-% IV SOLR
INTRAVENOUS | Status: AC
  Filled 2015-01-15: qty 50

## 2015-01-15 MED ORDER — FENTANYL CITRATE (PF) 100 MCG/2ML IJ SOLN: 25.0000 ug | INTRAMUSCULAR | Status: DC | PRN

## 2015-01-15 MED ORDER — PROPOFOL 10 MG/ML IV BOLUS
INTRAVENOUS | Status: DC | PRN
  Administered 2015-01-15: 100 mg via INTRAVENOUS

## 2015-01-15 MED ORDER — LACTATED RINGERS IR SOLN
Status: DC | PRN
  Administered 2015-01-15: 3000 mL

## 2015-01-15 MED ORDER — DIPHENHYDRAMINE HCL 50 MG/ML IJ SOLN: 12.5000 mg | Freq: Four times a day (QID) | INTRAMUSCULAR | Status: DC | PRN

## 2015-01-15 MED ORDER — PROPOFOL 10 MG/ML IV BOLUS
INTRAVENOUS | Status: AC
  Filled 2015-01-15: qty 20

## 2015-01-15 MED ORDER — LACTATED RINGERS IV SOLN: INTRAVENOUS | Status: DC

## 2015-01-15 MED ORDER — FENTANYL CITRATE (PF) 100 MCG/2ML IJ SOLN
INTRAMUSCULAR | Status: AC
  Administered 2015-01-15: 50 ug
  Filled 2015-01-15: qty 2

## 2015-01-15 MED ORDER — ONDANSETRON HCL 4 MG/2ML IJ SOLN: 4.0000 mg | INTRAMUSCULAR | Status: DC | PRN

## 2015-01-15 MED ORDER — ALBUTEROL SULFATE (2.5 MG/3ML) 0.083% IN NEBU: 2.5000 mg | INHALATION_SOLUTION | RESPIRATORY_TRACT | Status: DC | PRN

## 2015-01-15 MED ORDER — HYDROMORPHONE HCL 2 MG/ML IJ SOLN
INTRAMUSCULAR | Status: AC
  Filled 2015-01-15: qty 1

## 2015-01-15 MED ORDER — STERILE WATER FOR IRRIGATION IR SOLN
Status: DC | PRN
  Administered 2015-01-15: 1000 mL

## 2015-01-15 MED ORDER — ROCURONIUM BROMIDE 100 MG/10ML IV SOLN
INTRAVENOUS | Status: DC | PRN
  Administered 2015-01-15 (×2): 10 mg via INTRAVENOUS
  Administered 2015-01-15: 40 mg via INTRAVENOUS

## 2015-01-15 MED ORDER — DEXAMETHASONE SODIUM PHOSPHATE 10 MG/ML IJ SOLN
INTRAMUSCULAR | Status: DC | PRN
  Administered 2015-01-15: 10 mg via INTRAVENOUS

## 2015-01-15 MED ORDER — SODIUM CHLORIDE 0.9 % IJ SOLN
INTRAMUSCULAR | Status: AC
  Filled 2015-01-15: qty 20

## 2015-01-15 MED ORDER — ONDANSETRON HCL 4 MG/2ML IJ SOLN
INTRAMUSCULAR | Status: AC
Start: 2015-01-15 — End: 2015-01-15
  Filled 2015-01-15: qty 2

## 2015-01-15 MED ORDER — LIDOCAINE HCL (CARDIAC) 20 MG/ML IV SOLN
INTRAVENOUS | Status: AC
  Filled 2015-01-15: qty 5

## 2015-01-15 MED ORDER — SIMVASTATIN 40 MG PO TABS
40.0000 mg | ORAL_TABLET | Freq: Every day | ORAL | Status: DC
  Administered 2015-01-16 – 2015-01-17 (×2): 40 mg via ORAL
  Filled 2015-01-15 (×2): qty 1

## 2015-01-15 MED ORDER — DOCUSATE SODIUM 100 MG PO CAPS
100.0000 mg | ORAL_CAPSULE | Freq: Two times a day (BID) | ORAL | Status: DC
  Administered 2015-01-15 – 2015-01-17 (×4): 100 mg via ORAL
  Filled 2015-01-15 (×4): qty 1

## 2015-01-15 MED ORDER — SODIUM CHLORIDE 3 % IN NEBU
INHALATION_SOLUTION | RESPIRATORY_TRACT | Status: AC
  Filled 2015-01-15: qty 15

## 2015-01-15 MED ORDER — NEOSTIGMINE METHYLSULFATE 10 MG/10ML IV SOLN
INTRAVENOUS | Status: AC
  Filled 2015-01-15: qty 1

## 2015-01-15 MED ORDER — HYDROCODONE-ACETAMINOPHEN 5-325 MG PO TABS
1.0000 | ORAL_TABLET | ORAL | Status: DC | PRN
  Administered 2015-01-15: 1 via ORAL
  Filled 2015-01-15: qty 1

## 2015-01-15 MED ORDER — ALPRAZOLAM 0.5 MG PO TABS
0.5000 mg | ORAL_TABLET | Freq: Three times a day (TID) | ORAL | Status: DC | PRN
  Administered 2015-01-15 – 2015-01-16 (×2): 0.5 mg via ORAL
  Filled 2015-01-15 (×2): qty 1

## 2015-01-15 MED ORDER — CITALOPRAM HYDROBROMIDE 20 MG PO TABS
20.0000 mg | ORAL_TABLET | Freq: Every day | ORAL | Status: DC
  Administered 2015-01-15 – 2015-01-16 (×2): 20 mg via ORAL
  Filled 2015-01-15 (×2): qty 1

## 2015-01-15 MED ORDER — DOCUSATE SODIUM 100 MG PO CAPS: 100.0000 mg | ORAL_CAPSULE | Freq: Two times a day (BID) | ORAL | Status: DC

## 2015-01-15 MED ORDER — DEXAMETHASONE SODIUM PHOSPHATE 10 MG/ML IJ SOLN
INTRAMUSCULAR | Status: AC
  Filled 2015-01-15: qty 1

## 2015-01-15 MED ORDER — NEOSTIGMINE METHYLSULFATE 10 MG/10ML IV SOLN
INTRAVENOUS | Status: DC | PRN
  Administered 2015-01-15: 4 mg via INTRAVENOUS

## 2015-01-15 MED ORDER — LIDOCAINE HCL (CARDIAC) 20 MG/ML IV SOLN
INTRAVENOUS | Status: DC | PRN
  Administered 2015-01-15: 100 mg via INTRAVENOUS

## 2015-01-15 MED ORDER — LACTATED RINGERS IV SOLN
INTRAVENOUS | Status: DC | PRN
  Administered 2015-01-15 (×3): via INTRAVENOUS

## 2015-01-15 MED ORDER — ROCURONIUM BROMIDE 100 MG/10ML IV SOLN
INTRAVENOUS | Status: AC
  Filled 2015-01-15: qty 1

## 2015-01-15 MED ORDER — GLYCOPYRROLATE 0.2 MG/ML IJ SOLN
INTRAMUSCULAR | Status: DC | PRN
  Administered 2015-01-15: 0.4 mg via INTRAVENOUS
  Administered 2015-01-15: 0.6 mg via INTRAVENOUS

## 2015-01-15 MED ORDER — FENTANYL CITRATE (PF) 250 MCG/5ML IJ SOLN
INTRAMUSCULAR | Status: AC
  Filled 2015-01-15: qty 25

## 2015-01-15 MED ORDER — HYDROMORPHONE HCL 1 MG/ML IJ SOLN: 0.2500 mg | INTRAMUSCULAR | Status: DC | PRN

## 2015-01-15 MED ORDER — GABAPENTIN 300 MG PO CAPS
300.0000 mg | ORAL_CAPSULE | Freq: Three times a day (TID) | ORAL | Status: DC
  Administered 2015-01-15 – 2015-01-17 (×5): 300 mg via ORAL
  Filled 2015-01-15 (×5): qty 1

## 2015-01-15 MED ORDER — SUCCINYLCHOLINE CHLORIDE 20 MG/ML IJ SOLN
INTRAMUSCULAR | Status: DC | PRN
Start: 2015-01-15 — End: 2015-01-15
  Administered 2015-01-15: 100 mg via INTRAVENOUS

## 2015-01-15 MED ORDER — ONDANSETRON HCL 4 MG/2ML IJ SOLN
INTRAMUSCULAR | Status: DC | PRN
  Administered 2015-01-15: 4 mg via INTRAVENOUS

## 2015-01-15 MED ORDER — FAMOTIDINE 20 MG PO TABS
10.0000 mg | ORAL_TABLET | Freq: Every day | ORAL | Status: DC
  Administered 2015-01-17: 10 mg via ORAL
  Filled 2015-01-15 (×2): qty 1

## 2015-01-15 MED ORDER — GLYCOPYRROLATE 0.2 MG/ML IJ SOLN
INTRAMUSCULAR | Status: AC
  Filled 2015-01-15: qty 3

## 2015-01-15 MED ORDER — DIPHENHYDRAMINE HCL 12.5 MG/5ML PO ELIX: 12.5000 mg | ORAL_SOLUTION | Freq: Four times a day (QID) | ORAL | Status: DC | PRN

## 2015-01-15 MED ORDER — MIDAZOLAM HCL 2 MG/2ML IJ SOLN
INTRAMUSCULAR | Status: AC
  Filled 2015-01-15: qty 4

## 2015-01-15 MED ORDER — HYDROCODONE-ACETAMINOPHEN 5-325 MG PO TABS: 1.0000 | ORAL_TABLET | Freq: Four times a day (QID) | ORAL | Status: DC | PRN

## 2015-01-15 MED ORDER — BUPIVACAINE LIPOSOME 1.3 % IJ SUSP
20.0000 mL | Freq: Once | INTRAMUSCULAR | Status: AC
  Administered 2015-01-15: 20 mL
  Filled 2015-01-15: qty 20

## 2015-01-15 SURGICAL SUPPLY — 51 items
CHLORAPREP W/TINT 26ML (MISCELLANEOUS) ×3 IMPLANT
CLIP LIGATING HEM O LOK PURPLE (MISCELLANEOUS) IMPLANT
CLIP LIGATING HEMO LOK XL GOLD (MISCELLANEOUS) ×1 IMPLANT
CLIP LIGATING HEMO O LOK GREEN (MISCELLANEOUS) IMPLANT
COVER TIP SHEARS 8 DVNC (MISCELLANEOUS) ×1 IMPLANT
COVER TIP SHEARS 8MM DA VINCI (MISCELLANEOUS) ×2
CUTTER ECHEON FLEX ENDO 45 340 (ENDOMECHANICALS) ×8 IMPLANT
DECANTER SPIKE VIAL GLASS SM (MISCELLANEOUS) IMPLANT
DRAIN CHANNEL 15F RND FF 3/16 (WOUND CARE) IMPLANT
DRAPE ARM DVNC X/XI (DISPOSABLE) ×4 IMPLANT
DRAPE COLUMN DVNC XI (DISPOSABLE) ×1 IMPLANT
DRAPE DA VINCI XI ARM (DISPOSABLE) ×8
DRAPE DA VINCI XI COLUMN (DISPOSABLE) ×2
DRAPE INCISE IOBAN 66X45 STRL (DRAPES) ×1 IMPLANT
DRAPE LAPAROSCOPIC ABDOMINAL (DRAPES) ×3 IMPLANT
DRAPE SHEET LG 3/4 BI-LAMINATE (DRAPES) ×2 IMPLANT
DRAPE TABLE BACK 44X90 PK DISP (DRAPES) ×1 IMPLANT
DRESSING TELFA ISLAND 4X8 (GAUZE/BANDAGES/DRESSINGS) ×2 IMPLANT
ELECT PENCIL ROCKER SW 15FT (MISCELLANEOUS) ×1 IMPLANT
ELECT REM PT RETURN 9FT ADLT (ELECTROSURGICAL) ×3
ELECTRODE REM PT RTRN 9FT ADLT (ELECTROSURGICAL) ×2 IMPLANT
EVACUATOR SILICONE 100CC (DRAIN) IMPLANT
GLOVE BIOGEL M STRL SZ7.5 (GLOVE) ×6 IMPLANT
GOWN STRL REUS W/TWL LRG LVL3 (GOWN DISPOSABLE) ×6 IMPLANT
KIT BASIN OR (CUSTOM PROCEDURE TRAY) ×3 IMPLANT
LIQUID BAND (GAUZE/BANDAGES/DRESSINGS) ×4 IMPLANT
NDL INSUFFLATION 14GA 120MM (NEEDLE) ×1 IMPLANT
NEEDLE INSUFFLATION 14GA 120MM (NEEDLE) ×3 IMPLANT
PAD POSITIONING PINK XL (MISCELLANEOUS) IMPLANT
POUCH ENDO CATCH II 15MM (MISCELLANEOUS) ×3 IMPLANT
RELOAD WH ECHELON 45 (STAPLE) ×16 IMPLANT
SEAL CANN UNIV 5-8 DVNC XI (MISCELLANEOUS) ×4 IMPLANT
SEAL XI 5MM-8MM UNIVERSAL (MISCELLANEOUS) ×8
SET TUBE IRRIG SUCTION NO TIP (IRRIGATION / IRRIGATOR) IMPLANT
SHEET LAVH (DRAPES) IMPLANT
SOLUTION ANTI FOG 6CC (MISCELLANEOUS) ×1 IMPLANT
SOLUTION ELECTROLUBE (MISCELLANEOUS) ×3 IMPLANT
SPONGE LAP 18X18 X RAY DECT (DISPOSABLE) ×3 IMPLANT
SPONGE LAP 4X18 X RAY DECT (DISPOSABLE) IMPLANT
SUT ETHILON 3 0 PS 1 (SUTURE) ×3 IMPLANT
SUT MNCRL AB 4-0 PS2 18 (SUTURE) ×6 IMPLANT
SUT PDS AB 1 CT1 27 (SUTURE) ×12 IMPLANT
SUT VICRYL 0 UR6 27IN ABS (SUTURE) ×6 IMPLANT
TOWEL OR NON WOVEN STRL DISP B (DISPOSABLE) ×8 IMPLANT
TRAY FOLEY W/METER SILVER 14FR (SET/KITS/TRAYS/PACK) ×3 IMPLANT
TRAY FOLEY W/METER SILVER 16FR (SET/KITS/TRAYS/PACK) ×1 IMPLANT
TRAY LAPAROSCOPIC (CUSTOM PROCEDURE TRAY) ×3 IMPLANT
TROCAR BLADELESS OPT 12M 100M (ENDOMECHANICALS) ×1 IMPLANT
TROCAR UNIVERSAL OPT 12M 100M (ENDOMECHANICALS) ×2 IMPLANT
TUBING INSUFFLATION 10FT LAP (TUBING) ×1 IMPLANT
WATER STERILE IRR 1500ML POUR (IV SOLUTION) ×2 IMPLANT

## 2015-01-15 NOTE — Anesthesia Postprocedure Evaluation (Signed)
  Anesthesia Post-op Note  Patient: Amy [name]  Procedure(s) Performed: Procedure(s) (LRB): ROBOTIC ASSISTED LAPAROSCOPIC RADICAL NEPHRECTOMY (Left)  Patient Location: PACU  Anesthesia Type: General  Level of Consciousness: sedated but arouses easily  Airway and Oxygen Therapy: Patient Spontanous Breathing  Post-op Pain: mild  Post-op Assessment: Post-op Vital signs reviewed, Patient's Cardiovascular Status Stable, Respiratory Function Stable, Patent Airway and No signs of Nausea or vomiting. Chest clear to auscultation but with her rare cough it sounds like a wheeze. Albuterol neb given with some improvement. Oxygen saturation is now 92% on 3 L/minute [place]. No obvious distress. RR 18, BP 161/64, HR 72. She is quite sleepy after about 3 hours in PACU. Apparently she was up most of last night with her bowel prep.  Last Vitals:  Filed Vitals:   01/15/15 1915  BP: 165/66  Pulse: 68  Temp:   Resp: 13    Post-op Vital Signs: stable   Complications: No apparent anesthesia complications. Transfer to stepdown unit as she is requiring increased levels of oxygen and encouragement for deep breathing and pulmonary toilet.

## 2015-01-15 NOTE — H&P (Signed)
Amy [name] is an 79 y.o. female.    Chief Complaint: Pre-Op Left Radical Nephrectomy  HPI:    1 - Left Renal Mass / Likely Stage 3+ Renal Cell Carcinoma - 5.3cm left renal mass on eval episode transient renal insufficiency. Dedicated MRI 09/2014 and CT 11/2014 confirm enhanced 5cm mass with likely renal vein thrombus (not in IVC). No large hilar adenopathy or liver / lung lesions. 1 artery (some calcificaitons prox third, is non-calcified window very near aorta and again about 2cm distal) 1 vein renovascular anatomy. Baselin Cr 1.3's.  PMH sig for DM2 (A1c <1), neuropathy (gabapentin), lap chole, TAH, left AKA (PAD). Her PCP is Parke Poisson MD in [place]  Today  " Amy [name] " is seen to proceed with left radical nephrectomy. She has held plavix as directed by her PCP. No interval fevers.    Past Medical History  Diagnosis Date  . Chronic kidney disease   . Hyperlipidemia   . GERD (gastroesophageal reflux disease)   . Anxiety   . Depression   . Diabetes mellitus without complication   . Peripheral artery disease   . Hypertension   . Obesity   . Bronchitis     hx of   . History of frequent urinary tract infections   . Urinary incontinence   . Urinary frequency   . Left renal mass   . Cataract   . Numbness and tingling     right hand   . DVT (deep venous thrombosis)     hx of in left leg currently has left AKA   . Dry cough     Past Surgical History  Procedure Laterality Date  . Leg amputation      above the knee / left   . Abdominal hysterectomy  1978  . Cholecystectomy    . Carpal tunnel release Bilateral   . Stent placement right leg       Family History  Problem Relation Age of Onset  . Cancer Mother     Stomach  . Cancer Father   . Diabetes Brother   . Cancer Brother    Social History:  reports that she quit smoking about 10 years ago. Her smoking use included Cigarettes. She has a 76.5 pack-year smoking history. She has never used smokeless tobacco.  She reports that she does not drink alcohol or use illicit drugs.  Allergies:  Allergies  Allergen Reactions  . Fentanyl Other (See Comments)    urine retention  . Penicillins Itching and Rash    No prescriptions prior to admission    No results found for this or any previous visit (from the past 48 hour(s)). No results found.  Review of Systems  Constitutional: Negative.  Negative for fever.  HENT: Negative.   Eyes: Negative.   Respiratory: Negative.   Cardiovascular: Negative.   Gastrointestinal: Negative.   Genitourinary: Negative.   Musculoskeletal: Negative.   Skin: Negative.   Neurological: Negative.   Endo/Heme/Allergies: Negative.   Psychiatric/Behavioral: Negative.     There were no vitals taken for this visit. Physical Exam  Constitutional: She appears well-developed.  HENT:  Head: Normocephalic.  Eyes: Pupils are equal, round, and reactive to light.  Neck: Normal range of motion.  Cardiovascular: Normal rate.   Respiratory: Effort normal.  GI: Soft.  Genitourinary:  No CVAT  Musculoskeletal: Normal range of motion.  S/p left AKA  Neurological: She is alert.  Skin: Skin is warm.  Psychiatric: She has a normal mood and affect.  Her behavior is normal. Judgment and thought content normal.     Assessment/Plan   1 - Left Renal Mass / Likely Stage 3+ Renal Cell Carcinoma - although appears high stage, this is clinically localized disease. Agree with need for left radical nephrecotmy next available with goal of mass / thrombus reseciton before sig IVC component develops. I belive is amenable to robobic approach with plan to take artery and vein very proximally based on anatomy and goal of thrombus removal. She will need to stop plavix 5 days prior. NO role for nephron sparing or ablative therapies. Did discuss option of purely palliative approach as well.   We rediscussed the role of radical nephrectomy with the overall goal of complete surgical excision  (negative margins) and better staging / diagnosis. We specifically rediscussed that with removal of the kidney there would be an overall renal function decline with attendant risks of renal failure and need for dialysis in some cases, and need for kidney-friendly lifestyle post-op with excellent blood pressure and glycemic control. We then rediscussed surgical approaches including robotic and open techniques with robotic associated with a shorter convalescence. I showed the patient on their abdomen the approximately 4-6 incision (trocar) sites as well as presumed extraction sites with robotic approach as well as possible open incision sites. We specifically areddressed that there may be need to alter operative plans according to intraopertive findings including conversion to open procedure. We rediscussed specific peri-operative risks including bleeding, infection, deep vein thrombosis, pulmonary embolism, compartment syndrome, nuropathy / neuropraxia, heart attack, stroke, death, as well as long-term risks such as non-cure / need for additional therapy and need for imaging and lab based post-op surveillance protocols. We rediscussed typical hospital course of approximately 2 day hospitalization, need for peri-operative drains / catheters, and typical post-hospital course with return to most non-strenuous activities by 2 weeks and ability to return to most jobs and more strenuous activity such as exercise by 6 weeks.    After this lengthy and detail discussion, including answering all of the patient's questions to their satisfaction, they have chosen to proceed with radical nephrectomy.  We frankly discussed that with her baseline morbidity she is as 3-5X risk of ALL complications and very well may need DC to SNF for inpateint rehab prior to transfer back home.   Treyshaun Keatts 01/15/2015, 6:35 AM

## 2015-01-15 NOTE — Anesthesia Procedure Notes (Signed)
Procedure Name: Intubation Date/Time: 01/15/2015 1:46 PM Performed by: Lind Covert Pre-anesthesia Checklist: Patient identified, Timeout performed, Emergency Drugs available, Suction available and Patient being monitored Patient Re-evaluated:Patient Re-evaluated prior to inductionOxygen Delivery Method: Circle system utilized Preoxygenation: Pre-oxygenation with 100% oxygen Intubation Type: IV induction Laryngoscope Size: Mac and 3 Grade View: Grade I Tube size: 7.0 mm Number of attempts: 1 Airway Equipment and Method: Stylet Placement Confirmation: ETT inserted through vocal cords under direct vision,  breath sounds checked- equal and bilateral and positive ETCO2 Secured at: 20 cm Tube secured with: Tape Dental Injury: Teeth and Oropharynx as per pre-operative assessment

## 2015-01-15 NOTE — Brief Op Note (Signed)
01/15/2015  4:50 PM  PATIENT:  Amy [name]  79 y.o. 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  PROCEDURE:  Procedure(s): ROBOTIC ASSISTED LAPAROSCOPIC RADICAL NEPHRECTOMY (Left)  SURGEON:  Surgeon(s) and Role:    * Alexis Frock, MD - Primary  PHYSICIAN ASSISTANT:   ASSISTANTS: matthew lyons MD   ANESTHESIA:   general  EBL:  Total I/O In: 1000 [I.V.:1000] Out: -   BLOOD ADMINISTERED:none  DRAINS: foley to gravity   LOCAL MEDICATIONS USED:  MARCAINE     SPECIMEN:  Source of Specimen:  left radical nephrectomy with renal vein thrombus  DISPOSITION OF SPECIMEN:  PATHOLOGY  COUNTS:  YES  TOURNIQUET:  * No tourniquets in log *  DICTATION: .Other Dictation: Dictation Number [id number]  PLAN OF CARE: Admit to inpatient   PATIENT DISPOSITION:  PACU - hemodynamically stable.   Delay start of Pharmacological VTE agent (>24hrs) due to surgical blood loss or risk of bleeding: yes

## 2015-01-15 NOTE — Discharge Summary (Signed)
Date of admission: 01/15/2015  Date of discharge: 01/17/2015  Admission diagnosis: LEFT renal mass with renal vein thrombus  Discharge diagnosis: LEFT renal mass with renal vein thrombus  Secondary diagnoses: DM2 (A1c <1), neuropathy (gabapentin), lap chole, TAH, left AKA (PAD) (all POA)  History and Physical: For full details, please see admission history and physical. Briefly, Amy [name] is a 79 y.o. year old patient with LEFT renal mass with renal vein thrombus noted on both CT and MRI as well as transient renal insufficiency with baseline Cr 1.3s admitted s/p LEFT robot-assisted laparoscopic radical nephrectomy.   Hospital Course:   LEFT renal mass with renal vein thrombus-s/p robot-assisted laparoscopic LEFT radical nephrectomy 01/15/15. Uncomplicated procedure. Transferred to stepdown immediately post-operatively because of increased O2 requirement (SaO2 low 90s on 4LNC). She did well overnight POD 0 and was normalized on POD 1 with improvement in oxygen saturation. She was discharged home on POD 2 at which point she was maintaining satisfactory O2 sats on RA, tolerating a regular diet, voiding volitionally. Creatinine at discharge was 1.65. She will follow-up as scheduled with Dr. Tresa Moore for pathology review and post-op check.   Physical Exam:  Vital signs in last 24 hours: Temp:  [98.2 F (36.8 C)-99 F (37.2 C)] 98.8 F (37.1 C) (08/26 0542) Pulse Rate:  [69-80] 70 (08/26 0542) Resp:  [17-18] 18 (08/26 0542) BP: (118-144)/(54-60) 144/58 mmHg (08/26 0542) SpO2:  [94 %-100 %] 100 % (08/26 0542) Constitutional:  Alert and oriented, No acute distress Cardiovascular: Regular rate and rhythm, No JVD Respiratory: Normal respiratory effort, SORA GI: Abdomen is soft, nontender, nondistended, no abdominal masses, obese, incisions c/d/i Genitourinary: No CVAT. No foley Rectal: deferred Neurologic: Grossly intact, no focal deficits Psychiatric: Normal mood and affect  Laboratory  values:   Recent Labs  01/16/15 0338  HGB 11.7*  HCT 34.9*    Recent Labs  01/16/15 0338 01/17/15 0433  CREATININE 1.44* 1.65*    Disposition: Home  Discharge instruction: The patient was instructed to be ambulatory but told to refrain from heavy lifting, strenuous activity, or driving.   1- Drain Sites - You may have some mild persistent drainage from old drain site for several days, this is normal. This can be covered with cotton gauze for convenience.  2 - Stiches - Your stitches are all dissolvable. You may notice a "loose thread" at your incisions, these are normal and require no intervention. You may cut them flush to the skin with fingernail clippers if needed for comfort.  3 - Diet - No restrictions  4 - Activity - No heavy lifting / straining (any activities that require valsalva or "bearing down") x 4 weeks. Otherwise, no restrictions.  5 - Bathing - You may shower immediately. Do not take a bath or get into swimming pool where incision sites are submersed in water x 4 weeks.   6 - When to Call the Doctor - Call MD for any fever >102, any acute wound problems, or any severe nausea / vomiting. You can call the [facility] [phone]) 24 hours a day 365 days a year. It will roll-over to the answering service and on-call physician after hours.   Discharge medications:    Medication List    STOP taking these medications        clopidogrel 75 MG tablet  Commonly known as:  PLAVIX      TAKE these medications        ALPRAZolam 0.5 MG tablet  Commonly known as:  XANAX  Take 1 tablet (0.5 mg total) by mouth 3 (three) times daily as needed for anxiety.     citalopram 20 MG tablet  Commonly known as:  CELEXA  Take 20 mg by mouth at bedtime.     docusate sodium 100 MG capsule  Commonly known as:  COLACE  Take 1 capsule (100 mg total) by mouth 2 (two) times daily.     gabapentin 300 MG capsule  Commonly known as:  NEURONTIN  Take 300 mg by mouth  3 (three) times daily.     HYDROcodone-acetaminophen 7.5-325 MG per tablet  Commonly known as:  NORCO  Take 1 tablet by mouth 3 (three) times daily as needed.     HYDROcodone-acetaminophen 5-325 MG per tablet  Commonly known as:  NORCO/VICODIN  Take 1-2 tablets by mouth every 6 (six) hours as needed.     metoprolol 50 MG tablet  Commonly known as:  LOPRESSOR  TAKE 1 TABLET TWICE DAILY     pantoprazole 40 MG tablet  Commonly known as:  PROTONIX  Take 1 tablet (40 mg total) by mouth daily.     ranitidine 150 MG capsule  Commonly known as:  ZANTAC  Take 150 mg by mouth at bedtime.     simvastatin 40 MG tablet  Commonly known as:  ZOCOR  Take 40 mg by mouth daily.     triamterene-hydrochlorothiazide 37.5-25 MG per capsule  Commonly known as:  DYAZIDE  Take 1 capsule by mouth daily.     VENTOLIN HFA 108 (90 BASE) MCG/ACT inhaler  Generic drug:  albuterol  Inhale 1-2 puffs into the lungs every 4 (four) hours as needed for wheezing or shortness of breath.        Followup:  Follow-up Information    Follow up with Alexis Frock, MD.   Specialty:  Urology   Why:  as scheduled   Contact information:   [place] [address] [phone]

## 2015-01-15 NOTE — Transfer of Care (Signed)
Immediate Anesthesia Transfer of Care Note  Patient: Amy [name]  Procedure(s) Performed: Procedure(s): ROBOTIC ASSISTED LAPAROSCOPIC RADICAL NEPHRECTOMY (Left)  Patient Location: PACU  Anesthesia Type:General  Level of Consciousness: sedated  Airway & Oxygen Therapy: Patient Spontanous Breathing and Patient connected to face mask oxygen  Post-op Assessment: Report given to RN and Post -op Vital signs reviewed and stable  Post vital signs: Reviewed and stable  Last Vitals:  Filed Vitals:   01/15/15 1014  BP: 140/50  Pulse: 52  Temp: 36.7 C  Resp: 18    Complications: No apparent anesthesia complications

## 2015-01-15 NOTE — Anesthesia Preprocedure Evaluation (Addendum)
Anesthesia Evaluation  Patient identified by MRN, date of birth, ID band Patient awake    Reviewed: Allergy & Precautions, H&P , NPO status , Patient's Chart, lab work & pertinent test results, reviewed documented beta blocker date and time   Airway Mallampati: II  TM Distance: >3 FB Neck ROM: full    Dental  (+) Edentulous Upper, Missing, Dental Advisory Given All lower front teeth missing:   Pulmonary asthma , COPD COPD inhaler, former smoker,  Mild COPD breath sounds clear to auscultation  Pulmonary exam normal       Cardiovascular hypertension, Pt. on home beta blockers Normal cardiovascular examRhythm:regular Rate:Normal  Prolonged QT   Neuro/Psych Anxiety Depression negative neurological ROS  negative psych ROS   GI/Hepatic negative GI ROS, Neg liver ROS, GERD-  Medicated and Controlled,  Endo/Other  diabetes, Well Controlled, Type 2Diet controlled DM  Renal/GU Renal diseaseStage 3 kidney disease  negative genitourinary   Musculoskeletal   Abdominal   Peds  Hematology negative hematology ROS (+)   Anesthesia Other Findings   Reproductive/Obstetrics negative OB ROS                           Anesthesia Physical Anesthesia Plan  ASA: III  Anesthesia Plan: General   Post-op Pain Management:    Induction: Intravenous  Airway Management Planned: Oral ETT  Additional Equipment:   Intra-op Plan:   Post-operative Plan: Extubation in OR  Informed Consent: I have reviewed the patients History and Physical, chart, labs and discussed the procedure including the risks, benefits and alternatives for the proposed anesthesia with the patient or authorized representative who has indicated his/her understanding and acceptance.   Dental Advisory Given  Plan Discussed with: CRNA and Surgeon  Anesthesia Plan Comments:         Anesthesia Quick Evaluation

## 2015-01-16 ENCOUNTER — Encounter (HOSPITAL_COMMUNITY): Payer: Self-pay | Admitting: Urology

## 2015-01-16 LAB — HEMOGLOBIN AND HEMATOCRIT, BLOOD
HCT: 34.9 % — ABNORMAL LOW (ref 36.0–46.0)
HEMOGLOBIN: 11.7 g/dL — AB (ref 12.0–15.0)

## 2015-01-16 LAB — BASIC METABOLIC PANEL
ANION GAP: 7 (ref 5–15)
BUN: 16 mg/dL (ref 6–20)
CHLORIDE: 94 mmol/L — AB (ref 101–111)
CO2: 30 mmol/L (ref 22–32)
Calcium: 8.3 mg/dL — ABNORMAL LOW (ref 8.9–10.3)
Creatinine, Ser: 1.44 mg/dL — ABNORMAL HIGH (ref 0.44–1.00)
GFR calc Af Amer: 39 mL/min — ABNORMAL LOW (ref >60)
GFR calc non Af Amer: 34 mL/min — ABNORMAL LOW (ref >60)
Glucose, Bld: 183 mg/dL — ABNORMAL HIGH (ref 65–99)
POTASSIUM: 3.7 mmol/L (ref 3.5–5.1)
SODIUM: 131 mmol/L — AB (ref 135–145)

## 2015-01-16 LAB — MRSA PCR SCREENING: MRSA BY PCR: NEGATIVE

## 2015-01-16 MED ORDER — TRIAMTERENE-HCTZ 37.5-25 MG PO TABS
1.0000 | ORAL_TABLET | Freq: Every day | ORAL | Status: DC
  Administered 2015-01-16 – 2015-01-17 (×2): 1 via ORAL
  Filled 2015-01-16 (×2): qty 1

## 2015-01-16 NOTE — Progress Notes (Signed)
Pt voided clear yellow urine in bed. 1 episode of incotninence prior to tranfers. 4W RN Joelene Millin made aware that pt has voided post foley removal.

## 2015-01-16 NOTE — Progress Notes (Signed)
1 Day Post-Op Subjective: Doing better, tolerating diet. Voiding. Pain controlled. No bm/flatus. Encouraged OOB ICS.  Objective: Vital signs in last 24 hours: Temp:  [97.7 F (36.5 C)-99.7 F (37.6 C)] 97.7 F (36.5 C) (08/25 0800) Pulse Rate:  [60-83] 63 (08/25 1038) Resp:  [10-22] 14 (08/25 1038) BP: (121-183)/(43-149) 121/43 mmHg (08/25 1038) SpO2:  [86 %-100 %] 94 % (08/25 1038) Weight:  [75.2 kg (165 lb 12.6 oz)] 75.2 kg (165 lb 12.6 oz) (08/24 2040)  Intake/Output from previous day: 08/24 0701 - 08/25 0700 In: 2350 [I.V.:2350] Out: 650 [Urine:550; Blood:100] Intake/Output this shift: Total I/O In: 175 [I.V.:175] Out: 500 [Urine:500]  Physical Exam:  General: Alert and oriented CV: RRR Lungs: Clear Abdomen: Soft, ND Incisions: c/d/i Ext: NT, No erythema  Lab Results:  Recent Labs  01/16/15 0338  HGB 11.7*  HCT 34.9*   BMET  Recent Labs  01/16/15 0338  NA 131*  K 3.7  CL 94*  CO2 30  GLUCOSE 183*  BUN 16  CREATININE 1.44*  CALCIUM 8.3*     Studies/Results: No results found.  Assessment/Plan: POD 1 s/p LEFT RARN. Progressing. Primary issue is pulmonary at this point working on pulmonary toilet.  -Continue aggressive pulmonary toilet OOB   LOS: 1 day   Amy [name] 01/16/2015, 2:49 PM

## 2015-01-16 NOTE — Op Note (Deleted)
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  PROCEDURE:  Robotic-assisted laparoscopic left radical nephrectomy.  ASSISTANT:  Star Age, MD  ESTIMATED BLOOD LOSS:  100 mL.  COMPLICATIONS:  None.  SPECIMEN:  Left radical nephrectomy with renal vein thrombus en bloc.  DRAINS:  Foley catheter to straight drain.  FINDINGS: 1. Two artery 1 vein left renovascular anatomy, accessory lower pole     artery noted. 2. Moderate volume renal vein thrombus __________ approximately 1 cm     to the inferior vena cava. 3. Significant hilar adenopathy. 4. Significant desmoplastic reaction around left kidney.  INDICATIONS:  Ms. Delval is a 79 year old lady with history of multiple medical comorbidities and is a left leg amputee, who was found to have a large left renal mass with renal vein thrombus by our colleague Dr. Erlene Quan in [name].  The patient was referred for consideration of surgical extirpation as her lesion was clinically localized though likely staged by actual imaging and chest x-ray.  Also discussed management including palliation only versus medication only versus surgical extirpation without any assistance, and she wished to proceed with left radical nephrectomy as initial means of management. Informed consent was obtained and placed in medical record.  PROCEDURE IN DETAIL:  The patient being Amy [name], was verified. Procedure being left radical nephrectomy was confirmed.  Procedure was carried out.  Time-out was performed.  Intravenous antibiotics were administered.  General endotracheal anesthesia was  introduced.  The patient was placed into a left side up full flank position, applying 15 degrees stable flexion, superior arm elevator, axillary roll, sequential compression devices, bottom leg was bent, and top leg was above-the-knee amputation, and required dedicated positioning.  She was further fashioned on the operative table using a beanbag and 3-inch tape across her supraxiphoid chest in and around pelvis.  The sterile field was created by prepping and draping the patient's left flank using chlorhexidine gluconate and a high-flow low pressure pneumoperitoneum was obtained using Veress technique in the left lower quadrant, having passed the aspiration drop test.  Next, an 8-mm robotic camera port was placed in position approximately 1 handbreadth superior lateral to the umbilicus.  Laparoscopic examination of the peritoneal cavity revealed no significant adhesions and no visceral injury. Additional ports were then placed as follows.  An 8-mm subcostal robotic port, 8 mm inferior paramedian robotic port approximately one handbreadth superior to the pubic symphysis, 8 mm left far lateral robotic port approximately 1 handbreadth from a superior lateral to the inferior robotic port, 12 mm assistant port midline, 4 fingerbreadths above the camera, and 4 mm assistant port in the midline approximately 2 fingerbreadths below the camera port.  Robot was docked and passed through the electronic checks.  Initial attention as directed at development of retroperitoneum.  Incision was made lateral to the descending colon  from the area of the splenic flexure towards the area of the internal ring and the colon was carefully swept medially to significant desmoplastic reaction between the anterior surface of Gerota's fascia and the colonic mesentery.  Lower pole of the kidney was identified and placed on gentle lateral traction, dissection proceeded medial to this, ureter was encountered, doubly  clipped and ligated.  The gonadal vessels were encountered as well as several __________ controlled using vascular stapler __________ triangle superiorly towards the either renal hilum.  There was significant desmoplastic reaction and lengthy hilar adenopathy within this dissection plane.  There was an accessory lower pole artery noted and this controlled using medium-sized Weck clip proximally and cautery distally.  Renal hilum was encountered and consisted of single artery and single vein renovascular anatomy as anticipated.  Again there was significant desmoplastic reaction and lengthy hilar nodes.  Dissection plane was chosen to maximize nodal tissue with the specimen.  The artery was circumferentially mobilized and controlled using extra large Hem-o-Lok clip proximally followed by vascular stapler distally.  Vein was carefully inspected and thrombus was milked towards the kidney and appeared to be in adequate plane proximal to the edge of the thrombus for vascular load stapling, which was then performed, resulted in excellent hemostatic control of the hilum.  The adrenal was partially resected and kept with the specimen. Superior attachments were taken down using cautery scissors after lateral attachments, significantly freed at left radical nephrectomy. Specimen was placed into an extra large indication bag for retrieval. Hemostasis appeared excellent.  All sponge and needle counts were correct.  Robot was then undocked.  Specimen was retrieved by extending the previous 2 assistant port sites in the midline __________ the nephrectomy specimen.  This site was closed with fascia using figure-of- eight PDS times approximately 8 followed by reapproximation of Scarpa's and running Vicryl.  All incision sites were infiltrated with dilute lyophilized Marcaine and closed at the level of the skin using subcuticular Monocryl followed by Dermabond.  Procedure was then terminated.  The patient  tolerated the procedure well.  There were no immediate periprocedural complications.  The patient was taken to postanesthesia care unit in stable condition.          ______________________________ Alexis Frock, MD     TM/MEDQ  D:  01/15/2015  T:  01/16/2015  Job:  [id number]

## 2015-01-16 NOTE — Care Management Note (Signed)
Case Management Note  Patient Details  Name: TZIPPORAH JURICK MRN: [id number] Date of Birth: 1935/12/11  Subjective/Objective:              Radical nephrectomy      Action/Plan: Date:  January 16, 2015 U.R. performed for needs and level of care. Will continue to follow for Case Management needs.  Velva Harman, RN, BSN, [place]   [phone]  Expected Discharge Date:                  Expected Discharge Plan:  Skilled Nursing Facility  In-House Referral:  Clinical Social Work  Discharge planning Services  CM Consult  Post Acute Care Choice:  NA Choice offered to:  NA  DME Arranged:    DME Agency:     HH Arranged:    [place] Agency:     Status of Service:  In process, will continue to follow  Medicare Important Message Given:    Date Medicare IM Given:    Medicare IM give by:    Date Additional Medicare IM Given:    Additional Medicare Important Message give by:     If discussed at [place] of Stay Meetings, dates discussed:    Additional Comments:  Leeroy Cha, RN 01/16/2015, 1:57 PM

## 2015-01-16 NOTE — Progress Notes (Signed)
Received pt from ICU, alert and oriented, O2 intact, denies pain at this time, oriented to unit, call light with in reach

## 2015-01-16 NOTE — Progress Notes (Signed)
Full report given to [place] of 4W. VSS and pt stable for transport.

## 2015-01-16 NOTE — Progress Notes (Signed)
1 Day Post-Op  Subjective:  1 - Left Renal Mass with Renal Vein Thrombus - s/p robotic left radical nephrectomy 01/15/15. Observed in stepdown POD 0 due to comorbidity and O2 requironment.  Today Amy [name] feels hungry and would like some ice cream. No events overnight. Sats mostly low 90s on [place] O2. Hgb >10, Cr <1.5 today.   Objective: Vital signs in last 24 hours: Temp:  [98 F (36.7 C)-99.7 F (37.6 C)] 99.7 F (37.6 C) (08/25 0400) Pulse Rate:  [52-83] 82 (08/25 0400) Resp:  [11-22] 12 (08/25 0400) BP: (134-183)/(50-149) 134/57 mmHg (08/25 0400) SpO2:  [87 %-100 %] 92 % (08/25 0400) Weight:  [72.122 kg (159 lb)-75.2 kg (165 lb 12.6 oz)] 75.2 kg (165 lb 12.6 oz) (08/24 2040) Last BM Date:  (PTA)  Intake/Output from previous day: 08/24 0701 - 08/25 0700 In: 2300 [I.V.:2300] Out: 650 [Urine:550; Blood:100] Intake/Output this shift:    General appearance: alert, cooperative and at baseline Eyes: negative Nose: Nares normal. Septum midline. Mucosa normal. No drainage or sinus tenderness. Back: symmetric, no curvature. ROM normal. No CVA tenderness. Resp: non-labored on [place] O2 Cardio: Nl rate by bedside monitor GI: soft, non-tender; bowel sounds normal; no masses,  no organomegaly Female genitalia: foley c/d/i with clear urine and normal F[place]ale ext. genetalia Extremities: LLE AKA, stable Skin: Skin color, texture, turgor normal. No rashes or lesions Neurologic: Grossly normal Incision/Wound: Recent port sites / extraction sites c/d/i.   Lab Results:   Recent Labs  01/16/15 0338  HGB 11.7*  HCT 34.9*   BMET  Recent Labs  01/16/15 0338  NA 131*  K 3.7  CL 94*  CO2 30  GLUCOSE 183*  BUN 16  CREATININE 1.44*  CALCIUM 8.3*   PT/INR No results for input(s): LABPROT, INR in the last 72 hours. ABG No results for input(s): PHART, HCO3 in the last 72 hours.  Invalid input(s): PCO2, PO2  Studies/Results: No results found.  Anti-infectives: Anti-infectives    Start     Dose/Rate Route Frequency Ordered Stop   01/15/15 1030  ceFAZolin (ANCEF) IVPB 2 g/50 mL premix     2 g 100 mL/hr over 30 Minutes Intravenous  Once 01/15/15 1023 01/15/15 1405      Assessment/Plan:  1 - Left Renal Mass with Renal Vein Thrombus -doing well POD 1. Transfer to med-surg floor, DC foley, OOB to chair (does not ambulate at baseline). Saline lock. STRONGLY encouraged IS / deep breathing in effort to wean O2. She does not appear volume overloaded. Remain in house.  [facility], Amy [name] 01/16/2015

## 2015-01-16 NOTE — Op Note (Signed)
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  PROCEDURE:  Robotic-assisted laparoscopic left radical nephrectomy.  ASSISTANT:  Star Age, MD  ESTIMATED BLOOD LOSS:  100 mL.  COMPLICATIONS:  None.  SPECIMEN:  Left radical nephrectomy with renal vein thrombus and hilar lymph nodes en bloc.  DRAINS:  Foley catheter to straight drain.  FINDINGS: 1. Two artery 1 vein left renovascular anatomy, accessory lower pole     artery noted. 2. Moderate volume renal vein thrombus extending to approximately 1 cm     to the inferior vena cava with kidney on lateral stretch. 3. Significant hilar adenopathy. 4. Significant desmoplastic reaction around left kidney.  INDICATIONS:  Amy [name] is a 79 year old lady with history of multiple medical comorbidities and is a left leg amputee, who was found to have a large left renal mass with renal vein thrombus by our colleague Dr. Erlene Quan in [place].  The patient was referred for consideration of surgical extirpation as her lesion was clinically localized though likely staged by actual imaging and chest x-ray.  Also discussed management including palliation only versus medication only versus surgical extirpation without any assistance, and she wished to proceed with left radical nephrectomy as initial means of management. Informed consent was obtained and placed in medical record.  PROCEDURE IN DETAIL:  The patient being Amy [name], was verified. Procedure being left radical nephrectomy was confirmed.  Procedure was carried out.  Time-out was performed.  Intravenous antibiotics were administered.  General  endotracheal anesthesia was introduced.  The patient was placed into a left side up full flank position, applying 15 degrees table flexion, superior arm elevator, axillary roll, sequential compression devices, bottom leg was bent, and top leg was above-the-knee amputation, and did not require dedicated positioning.  She was further fashioned on the operative table using a beanbag and 3-inch tape across her supraxiphoid chest in and around pelvis.  The sterile field was created by prepping and draping the patient's left flank using chlorhexidine gluconate and a high-flow low pressure pneumoperitoneum was obtained using Veress technique in the left lower quadrant, having passed the aspiration and drop test.  Next, an 8-mm robotic camera port was placed in position approximately 1 handbreadth superior lateral to the umbilicus.  Laparoscopic examination of the peritoneal cavity revealed no significant adhesions and no visceral injury. Additional ports were then placed as follows.  An 8-mm subcostal robotic port, 8 mm inferior paramedian robotic port approximately one handbreadth superior to the pubic symphysis, 8 mm left far lateral robotic port approximately 1 handbreadth from a superior lateral to the inferior robotic port, 12 mm assistant port midline, 4 fingerbreadths above the camera, and 4 mm assistant port in the midline approximately 2 fingerbreadths below the camera port.  Robot was docked and passed through the electronic checks.  Initial attention as directed at development of retroperitoneum.  Incision  was made lateral to the descending colon from the area of the splenic flexure towards the area of the internal ring and the colon was carefully swept medially which revealed significant desmoplastic reaction between the anterior surface of Gerota's fascia and the colonic mesentery.  Lower pole of the kidney was identified and placed on gentle lateral traction, dissection  proceeded medial to this, ureter was encountered, doubly clipped and ligated.  The gonadal vessels were encountered as well as several likely neoplastic vessels which were controlled using vascular stapler. Dissection proceeded within this triangle superiorly towards the  renal hilum.  There was significant desmoplastic reaction and lengthy hilar adenopathy within this dissection plane.  There was an accessory lower pole artery noted and this controlled using medium-sized Weck clip proximally and cautery distally.  Renal hilum was encountered and consisted of single artery and single vein renovascular anatomy as anticipated.  Again there was significant desmoplastic reaction and lengthy hilar nodes.  Dissection plane was chosen to maximize nodal tissue with the specimen.  The artery was circumferentially mobilized and controlled using extra large Hem-o-Lok clip proximally followed by vascular stapler distally.  Vein was carefully inspected and thrombus was milked towards the kidney and appeared to be in adequate plane proximal to the edge of the thrombus for vascular load stapling, which was then performed, resulted in excellent hemostatic control of the hilum.  The adrenal was partially resected and kept with the specimen. Superior attachments were taken down using cautery scissors after lateral attachments, significantly freed at left radical nephrectomy. Specimen was placed into an extra large indication bag for retrieval. Hemostasis appeared excellent.  All sponge and needle counts were correct.  Robot was then undocked.  Specimen was retrieved by extending and connecting the previous 2 assistant port sites in the midline.  This site was closed with fascia using figure-of- eight PDS times approximately 8 followed by reapproximation of Scarpa's and running Vicryl.  All incision sites were infiltrated with dilute lyophilized Marcaine and closed at the level of the skin using subcuticular  Monocryl followed by Dermabond.  Procedure was then terminated.  The patient tolerated the procedure well.  There were no immediate periprocedural complications.  The patient was taken to postanesthesia care unit in stable condition.          ______________________________ Alexis Frock, MD     TM/MEDQ  D:  01/15/2015  T:  01/16/2015  Job:  [id number]

## 2015-01-16 NOTE — Progress Notes (Signed)
1 Day Post-Op s/p LEFT robot-assisted laparoscopic radical nephrectomy Subjective: Transferred to stepdown from PACU because of increased O2 requirement. Maintained SaO2 in the low 90s on 2-3LNC o/n and early am. C/o some incisional discomfort but overall well tolerated. Tolerating PO without n/v.  Objective: Vital signs in last 24 hours: Temp:  [98 F (36.7 C)-99.7 F (37.6 C)] 99.7 F (37.6 C) (08/25 0400) Pulse Rate:  [52-83] 82 (08/25 0400) Resp:  [11-22] 12 (08/25 0400) BP: (134-183)/(50-149) 134/57 mmHg (08/25 0400) SpO2:  [87 %-100 %] 92 % (08/25 0400) Weight:  [72.122 kg (159 lb)-75.2 kg (165 lb 12.6 oz)] 75.2 kg (165 lb 12.6 oz) (08/24 2040)  Intake/Output from previous day: 08/24 0701 - 08/25 0700 In: 2300 [I.V.:2300] Out: 650 [Urine:550; Blood:100] Intake/Output this shift:    Physical Exam:  General: Alert and oriented CV: RRR Lungs: NWOB on 2LNC Abdomen: Soft, ND, obese Incisions: c/d/i, dressed Foley: clear yellow urine Ext: NT, No erythema  Lab Results:  Recent Labs  01/16/15 0338  HGB 11.7*  HCT 34.9*   BMET  Recent Labs  01/16/15 0338  NA 131*  K 3.7  CL 94*  CO2 30  GLUCOSE 183*  BUN 16  CREATININE 1.44*  CALCIUM 8.3*     Studies/Results: No results found.  Assessment/Plan: POD1 s/p LEFT robot-assisted laparoscopic radical nephrectomy. Expected AKI 2/2 nephron loss (Cr 1.4 from baseline 1.3). Hb stable.  -d/c foley -transfer to floor -advanced diet as tolerated -pulmonary toilet/ICS/OOB -saline lock IVF -continue to monitor   LOS: 1 day   Star Age 01/16/2015, 7:12 AM    I have seen and examined the pateint as documented in progress note authored by me on same date.

## 2015-01-17 LAB — BASIC METABOLIC PANEL
ANION GAP: 9 (ref 5–15)
BUN: 20 mg/dL (ref 6–20)
CALCIUM: 8.9 mg/dL (ref 8.9–10.3)
CHLORIDE: 95 mmol/L — AB (ref 101–111)
CO2: 31 mmol/L (ref 22–32)
Creatinine, Ser: 1.65 mg/dL — ABNORMAL HIGH (ref 0.44–1.00)
GFR calc non Af Amer: 29 mL/min — ABNORMAL LOW (ref >60)
GFR, EST AFRICAN AMERICAN: 33 mL/min — AB (ref >60)
GLUCOSE: 128 mg/dL — AB (ref 65–99)
Potassium: 4 mmol/L (ref 3.5–5.1)
Sodium: 135 mmol/L (ref 135–145)

## 2015-01-17 MED ORDER — BISACODYL 10 MG RE SUPP
10.0000 mg | Freq: Once | RECTAL | Status: AC
  Administered 2015-01-17: 10 mg via RECTAL
  Filled 2015-01-17: qty 1

## 2015-01-17 NOTE — Care Management Note (Signed)
Case Management Note  Patient Details  Name: Amy [name] MRN: [id number] Date of Birth: May 28, 1935  Subjective/Objective:                    Action/Plan:d/c home no needs or orders.   Expected Discharge Date:                  Expected Discharge Plan:  Home/Self Care  In-House Referral:  Clinical Social Work  Discharge planning Services  CM Consult  Post Acute Care Choice:  NA Choice offered to:  NA  DME Arranged:    DME Agency:     HH Arranged:    [place] Agency:     Status of Service:  Completed, signed off  Medicare Important Message Given:    Date Medicare IM Given:    Medicare IM give by:    Date Additional Medicare IM Given:    Additional Medicare Important Message give by:     If discussed at [place] of Stay Meetings, dates discussed:    Additional Comments:  Dessa Phi, RN 01/17/2015, 12:26 PM

## 2015-01-17 NOTE — Discharge Instructions (Signed)
°  You may restart your aspirin and Plavix in 1 week (Friday 01/24/15) DO NOT TAKE these medicines before that time  1- Drain Sites - You may have some mild persistent drainage from old drain site for several days, this is normal. This can be covered with cotton gauze for convenience.  2 - Stiches - Your stitches are all dissolvable. You may notice a "loose thread" at your incisions, these are normal and require no intervention. You may cut them flush to the skin with fingernail clippers if needed for comfort.  3 - Diet - No restrictions  4 - Activity - No heavy lifting / straining (any activities that require valsalva or "bearing down") x 4 weeks. Otherwise, no restrictions.  5 - Bathing - You may shower immediately. Do not take a bath or get into swimming pool where incision sites are submersed in water x 4 weeks.   6 - When to Call the Doctor - Call MD for any fever >102, any acute wound problems, or any severe nausea / vomiting. You can call the [facility] [phone]) 24 hours a day 365 days a year. It will roll-over to the answering service and on-call physician after hours.

## 2015-01-18 ENCOUNTER — Emergency Department
Admission: EM | Admit: 2015-01-18 | Discharge: 2015-01-18 | Disposition: A | Discharge status: Home / Self Care | Payer: Commercial Managed Care - HMO | Attending: Emergency Medicine | Admitting: Emergency Medicine

## 2015-01-18 ENCOUNTER — Encounter: Payer: Self-pay | Admitting: Medical Oncology

## 2015-01-18 DIAGNOSIS — R05 Cough: Secondary | ICD-10-CM | POA: Insufficient documentation

## 2015-01-18 DIAGNOSIS — R339 Retention of urine, unspecified: Secondary | ICD-10-CM | POA: Diagnosis not present

## 2015-01-18 DIAGNOSIS — G8918 Other acute postprocedural pain: Secondary | ICD-10-CM | POA: Diagnosis present

## 2015-01-18 DIAGNOSIS — N183 Chronic kidney disease, stage 3 (moderate): Secondary | ICD-10-CM | POA: Insufficient documentation

## 2015-01-18 DIAGNOSIS — I129 Hypertensive chronic kidney disease with stage 1 through stage 4 chronic kidney disease, or unspecified chronic kidney disease: Secondary | ICD-10-CM | POA: Insufficient documentation

## 2015-01-18 DIAGNOSIS — Z880 Allergy status to penicillin: Secondary | ICD-10-CM | POA: Insufficient documentation

## 2015-01-18 DIAGNOSIS — Z79899 Other long term (current) drug therapy: Secondary | ICD-10-CM | POA: Diagnosis not present

## 2015-01-18 DIAGNOSIS — E119 Type 2 diabetes mellitus without complications: Secondary | ICD-10-CM | POA: Diagnosis not present

## 2015-01-18 DIAGNOSIS — Z87891 Personal history of nicotine dependence: Secondary | ICD-10-CM | POA: Diagnosis not present

## 2015-01-18 NOTE — ED Notes (Signed)
Pt to triage with husband who reports pt had left kidney removed Wednesday at [facility] long, pt has not voided since midnight last night. Pt also reports she has a cough that has recently began.

## 2015-01-18 NOTE — Discharge Instructions (Signed)
We offered to check blood work and a chest x-ray for you, but we understand that you would prefer to leave since you are now able to urinate, and we feel that is understandable and appropriate.  Please follow-up with your surgeon at the next scheduled appointment and/or with your regular doctor early next week.  Return to the emergency department if you develop new or worsening symptoms that concern you.   Acute Urinary Retention Urinary retention means you are unable to pee completely or at all (empty your bladder). HOME CARE  Drink enough fluids to keep your pee (urine) clear or pale yellow.  If you are sent home with a tube that drains the bladder (catheter), there will be a drainage bag attached to it. There are two types of bags. One is big that you can wear at night without having to empty it. One is smaller and needs to be emptied more often.  Keep the drainage bag emptied.  Keep the drainage bag lower than the tube.  Only take medicine as told by your doctor. GET HELP IF:  You have a low-grade fever.  You have spasms or you are leaking pee when you have spasms. GET HELP RIGHT AWAY IF:   You have chills or a fever.  Your catheter stops draining pee.  Your catheter falls out.  You have increased bleeding that does not stop after you have rested and increased the amount of fluids you had been drinking. MAKE SURE YOU:   Understand these instructions.  Will watch your condition.  Will get help right away if you are not doing well or get worse. Document Released: 10/27/2007 Document Revised: 05/15/2013 Document Reviewed: 10/19/2012 [facility] Patient Information 2015 [place], [place]. This information is not intended to replace advice given to you by your health care provider. Make sure you discuss any questions you have with your health care provider.

## 2015-01-18 NOTE — ED Provider Notes (Signed)
[facility] Emergency Department Provider Note  ____________________________________________  Time seen: Approximately 6:27 PM  I have reviewed the triage vital signs and the nursing notes.   HISTORY  Chief Complaint Post-op Problem    HPI Amy [name] is a 79 y.o. female who had her left kidney removed at [facility] 3 days ago who presents with concerns that she has not urinated for about half a day.  She is not having any pain or discomfort which is out of proportion to her recent surgery.  Having a thick cough but this is been steady since her hospitalization.  She is using her incentive spirometer at home.  She has not had any fever or chills, nausea, vomiting, chest pain, or shortness of breath.  Of note, when I evaluated the patient, she and her husband both told me that she has urinated since being in the emergency department and he now wanted to go home.  Because she was able to urinate they are reassured and they do not want any lab work done.   Past Medical History  Diagnosis Date  . Chronic kidney disease   . Hyperlipidemia   . GERD (gastroesophageal reflux disease)   . Anxiety   . Depression   . Diabetes mellitus without complication   . Peripheral artery disease   . Hypertension   . Obesity   . Bronchitis     hx of   . History of frequent urinary tract infections   . Urinary incontinence   . Urinary frequency   . Left renal mass   . Cataract   . Numbness and tingling     right hand   . DVT (deep venous thrombosis)     hx of in left leg currently has left AKA   . Dry cough     Patient Active Problem List   Diagnosis Date Noted  . Renal neoplasm 01/15/2015  . Pre-operative cardiovascular examination 01/07/2015  . Preoperative clearance 01/02/2015  . Abnormal EKG 01/02/2015  . Renal mass 12/05/2014  . Acid reflux 11/20/2014  . Acute urinary tract infection 11/20/2014  . Acute vulvovaginitis 11/20/2014  . Abnormal mental  state 11/20/2014  . Anxiety, generalized 11/20/2014  . AB (asthmatic bronchitis) 11/20/2014  . Aortic atherosclerosis 11/20/2014  . Cataract 11/20/2014  . Congestion of respiratory tract 11/20/2014  . Brash 11/20/2014  . Leg pain 11/20/2014  . Continuous opioid dependence 11/20/2014  . Chronic kidney disease (CKD), stage III (moderate) 11/20/2014  . COPD, mild 11/20/2014  . Difficult or painful urination 11/20/2014  . Encounter for general adult medical examination without abnormal findings 11/20/2014  . Excessive falling 11/20/2014  . Fatty infiltration of liver 11/20/2014  . Gravida 1 11/20/2014  . BP (high blood pressure) 11/20/2014  . HLD (hyperlipidemia) 11/20/2014  . Flu vaccine need 11/20/2014  . Candida glabrata infection 11/20/2014  . Anterior knee pain 11/20/2014  . Kidney lump 11/20/2014  . LBP (low back pain) 11/20/2014  . Low blood pressure reading 11/20/2014  . Malaise and fatigue 11/20/2014  . OP (osteoporosis) 11/20/2014  . Parity 1 11/20/2014  . FLS (phantom limb syndrome) 11/20/2014  . Need for vaccination 11/20/2014  . Abnormal presence of protein in urine 11/20/2014  . Peripheral blood vessel disorder 11/20/2014  . Allergic rhinitis 11/20/2014  . Groin pain 11/20/2014  . At risk for falling 11/20/2014  . Screening for gout 11/20/2014  . Change in blood platelet count 11/20/2014  . Urgency of micturation 11/20/2014  .  Lower urinary tract infection 11/20/2014  . Avitaminosis D 11/20/2014  . Phantom limb syndrome with pain 11/05/2014  . Anxiety 11/05/2014  . Elevated serum creatinine 11/05/2014  . Diabetes mellitus type 2, controlled 11/05/2014  . H/O adenomatous polyp of colon 07/27/2010    Past Surgical History  Procedure Laterality Date  . Leg amputation      above the knee / left   . Abdominal hysterectomy  1978  . Cholecystectomy    . Carpal tunnel release Bilateral   . Stent placement right leg     . Robot assisted laparoscopic nephrectomy  Left 01/15/2015    Procedure: ROBOTIC ASSISTED LAPAROSCOPIC RADICAL NEPHRECTOMY;  Surgeon: Alexis Frock, MD;  Location: [facility];  Service: Urology;  Laterality: Left;    Current Outpatient Rx  Name  Route  Sig  Dispense  Refill  . ALPRAZolam (XANAX) 0.5 MG tablet   Oral   Take 1 tablet (0.5 mg total) by mouth 3 (three) times daily as needed for anxiety.   90 tablet   0   . citalopram (CELEXA) 20 MG tablet   Oral   Take 20 mg by mouth at bedtime.          . docusate sodium (COLACE) 100 MG capsule   Oral   Take 1 capsule (100 mg total) by mouth 2 (two) times daily.   30 capsule   0   . gabapentin (NEURONTIN) 300 MG capsule   Oral   Take 300 mg by mouth 3 (three) times daily.          [name] HYDROcodone-acetaminophen (NORCO) 7.5-325 MG per tablet   Oral   Take 1 tablet by mouth 3 (three) times daily as needed. Patient taking differently: Take 1 tablet by mouth 3 (three) times daily as needed for moderate pain or severe pain.    90 tablet   0   . HYDROcodone-acetaminophen (NORCO/VICODIN) 5-325 MG per tablet   Oral   Take 1-2 tablets by mouth every 6 (six) hours as needed.   30 tablet   0   . metoprolol (LOPRESSOR) 50 MG tablet      TAKE 1 TABLET TWICE DAILY Patient taking differently: TAKE 50 MG BY MOUTH TWICE DAILY   180 tablet   0   . pantoprazole (PROTONIX) 40 MG tablet   Oral   Take 1 tablet (40 mg total) by mouth daily.   90 tablet   1   . ranitidine (ZANTAC) 150 MG capsule   Oral   Take 150 mg by mouth at bedtime.          . simvastatin (ZOCOR) 40 MG tablet   Oral   Take 40 mg by mouth daily.          [name] triamterene-hydrochlorothiazide (DYAZIDE) 37.5-25 MG per capsule   Oral   Take 1 capsule by mouth daily.         . VENTOLIN HFA 108 (90 BASE) MCG/ACT inhaler   Inhalation   Inhale 1-2 puffs into the lungs every 4 (four) hours as needed for wheezing or shortness of breath.            Dispense as written.     Allergies Fentanyl and  Penicillins  Family History  Problem Relation Age of Onset  . Cancer Mother     Stomach  . Cancer Father   . Diabetes Brother   . Cancer Brother     Social History Social History  Substance Use Topics  . Smoking  status: Former Smoker -- 1.50 packs/day for 51 years    Types: Cigarettes    Quit date: 05/24/2004  . Smokeless tobacco: Never Used  . Alcohol Use: No    Review of Systems Constitutional: No fever/chills Eyes: No visual changes. ENT: No sore throat. Cardiovascular: Denies chest pain. Respiratory: Denies shortness of breath. Gastrointestinal: Mild postoperative abdominal pain.  No nausea, no vomiting.  No diarrhea.  No constipation. Genitourinary: Negative for dysuria.  Decreased urine production since yesterday Musculoskeletal: Negative for back pain. Skin: Negative for rash. Neurological: Negative for headaches, focal weakness or numbness.  10-point ROS otherwise negative.  ____________________________________________   PHYSICAL EXAM:  VITAL SIGNS: ED Triage Vitals  Enc Vitals Group     BP 01/18/15 1730 124/56 mmHg     Pulse Rate 01/18/15 1730 76     Resp 01/18/15 1730 20     Temp 01/18/15 1730 98.4 F (36.9 C)     Temp Source 01/18/15 1730 Oral     SpO2 01/18/15 1730 92 %     Weight 01/18/15 1730 154 lb (69.854 kg)     Height --      Head Cir --      Peak Flow --      Pain Score 01/18/15 1731 0     Pain Loc --      Pain Edu? --      Excl. in [place]? --     Constitutional: Alert and oriented. Well appearing and in no acute distress.  Resting comfortably Eyes: Conjunctivae are normal. PERRL. EOMI. Head: Atraumatic. Nose: No congestion/rhinnorhea. Mouth/Throat: Mucous membranes are moist.  Oropharynx non-erythematous. Neck: No stridor.   Cardiovascular: Normal rate, regular rhythm. Grossly normal heart sounds.  Good peripheral circulation. Respiratory: Normal respiratory effort.  No retractions. Lungs CTAB. Gastrointestinal: Soft with mild  tenderness to palpation around the operative site but no acute tenderness.  Surgical incisions are well-appearing with no evidence of infection. Musculoskeletal: No lower extremity tenderness nor edema.  No joint effusions. Neurologic:  Normal speech and language. No gross focal neurologic deficits are appreciated.  Skin:  Skin is warm, dry and intact. No rash noted.   ____________________________________________   LABS (all labs ordered are listed, but only abnormal results are displayed)  Labs Reviewed - No data to display ____________________________________________  EKG  Not indicated ____________________________________________  RADIOLOGY   No results found.  ____________________________________________   PROCEDURES  Procedure(s) performed: None  Critical Care performed: No ____________________________________________   INITIAL IMPRESSION / ASSESSMENT AND PLAN / ED COURSE  Pertinent labs & imaging results that were available during my care of the patient were reviewed by me and considered in my medical decision making (see chart for details).  I offered to check blood work (metabolic panel), urine, and a chest x-ray for the patient given her chief complaint and initial concerns, but the patient and her husband are both adamant that they would like to go home.  They are not worried about her urine or kidney that she is urinated and because her cough has been consistent since hospitalization they do not want to check a chest x-ray.  I offered 3 times and they are consistent in their response is.  I will discharge her for outpatient follow-up as scheduled.  ____________________________________________  FINAL CLINICAL IMPRESSION(S) / ED DIAGNOSES  Final diagnoses:  Urinary retention      NEW MEDICATIONS STARTED DURING THIS VISIT:  Discharge Medication List as of 01/18/2015  6:39 PM  Hinda Kehr, MD 01/18/15 2228

## 2015-01-29 ENCOUNTER — Encounter: Payer: Self-pay | Admitting: Family Medicine

## 2015-01-29 ENCOUNTER — Ambulatory Visit (INDEPENDENT_AMBULATORY_CARE_PROVIDER_SITE_OTHER): Payer: Commercial Managed Care - HMO | Admitting: Family Medicine

## 2015-01-29 VITALS — BP 128/70 | HR 62 | Temp 98.9°F | Resp 18 | Ht 62.0 in

## 2015-01-29 DIAGNOSIS — Z905 Acquired absence of kidney: Secondary | ICD-10-CM | POA: Insufficient documentation

## 2015-01-29 DIAGNOSIS — F419 Anxiety disorder, unspecified: Secondary | ICD-10-CM | POA: Diagnosis not present

## 2015-01-29 DIAGNOSIS — Z23 Encounter for immunization: Secondary | ICD-10-CM | POA: Insufficient documentation

## 2015-01-29 DIAGNOSIS — G546 Phantom limb syndrome with pain: Secondary | ICD-10-CM | POA: Diagnosis not present

## 2015-01-29 MED ORDER — ALPRAZOLAM 0.5 MG PO TABS: 0.5000 mg | ORAL_TABLET | Freq: Three times a day (TID) | ORAL | Status: DC | PRN

## 2015-01-29 MED ORDER — HYDROCODONE-ACETAMINOPHEN 7.5-325 MG PO TABS: 1.0000 | ORAL_TABLET | Freq: Three times a day (TID) | ORAL | Status: DC | PRN

## 2015-01-29 NOTE — Progress Notes (Signed)
Name: Amy [name]   MRN: [id number]    DOB: [birth year]   Date:01/29/2015       Progress Note  Subjective  Chief Complaint  Chief Complaint  Patient presents with  . Follow-up    1 mo. chronic pain  . Diabetes  . Hyperlipidemia    Anxiety Presents for follow-up visit. Symptoms include excessive worry, insomnia, irritability and nervous/anxious behavior.   Past treatments include benzodiazephines. Compliance with prior treatments has been good.   Pt. Is here for phantom limb pain on her left leg. Pt. Has left Above-knee amputation in 2007 and has pain in the affected area. This is relieved with hydrocodone taken as needed. No side effects reported.  Past Medical History  Diagnosis Date  . Chronic kidney disease   . Hyperlipidemia   . GERD (gastroesophageal reflux disease)   . Anxiety   . Depression   . Diabetes mellitus without complication   . Peripheral artery disease   . Hypertension   . Obesity   . Bronchitis     hx of   . History of frequent urinary tract infections   . Urinary incontinence   . Urinary frequency   . Left renal mass   . Cataract   . Numbness and tingling     right hand   . DVT (deep venous thrombosis)     hx of in left leg currently has left AKA   . Dry cough     Past Surgical History  Procedure Laterality Date  . Leg amputation      above the knee / left   . Abdominal hysterectomy  1978  . Cholecystectomy    . Carpal tunnel release Bilateral   . Stent placement right leg     . Robot assisted laparoscopic nephrectomy Left 01/15/2015    Procedure: ROBOTIC ASSISTED LAPAROSCOPIC RADICAL NEPHRECTOMY;  Surgeon: Alexis Frock, MD;  Location: [facility];  Service: Urology;  Laterality: Left;    Family History  Problem Relation Age of Onset  . Cancer Mother     Stomach  . Cancer Father   . Diabetes Brother   . Cancer Brother     Social History   Social History  . Marital Status: Married    Spouse Name: N/A  . Number of Children: N/A   . Years of Education: N/A   Occupational History  . Not on file.   Social History Main Topics  . Smoking status: Former Smoker -- 1.50 packs/day for 51 years    Types: Cigarettes    Quit date: 05/24/2004  . Smokeless tobacco: Never Used  . Alcohol Use: No  . Drug Use: No  . Sexual Activity: Not Currently   Other Topics Concern  . Not on file   Social History Narrative     Current outpatient prescriptions:  .  ALPRAZolam (XANAX) 0.5 MG tablet, Take 1 tablet (0.5 mg total) by mouth 3 (three) times daily as needed for anxiety., Disp: 90 tablet, Rfl: 0 .  citalopram (CELEXA) 20 MG tablet, Take 20 mg by mouth at bedtime. , Disp: , Rfl:  .  clopidogrel (PLAVIX) 75 MG tablet, , Disp: , Rfl:  .  docusate sodium (COLACE) 100 MG capsule, Take 1 capsule (100 mg total) by mouth 2 (two) times daily., Disp: 30 capsule, Rfl: 0 .  gabapentin (NEURONTIN) 300 MG capsule, Take 300 mg by mouth 3 (three) times daily. , Disp: , Rfl:  .  HYDROcodone-acetaminophen (NORCO) 7.5-325 MG per tablet, Take 1  tablet by mouth 3 (three) times daily as needed. (Patient taking differently: Take 1 tablet by mouth 3 (three) times daily as needed for moderate pain or severe pain. ), Disp: 90 tablet, Rfl: 0 .  HYDROcodone-acetaminophen (NORCO/VICODIN) 5-325 MG per tablet, Take 1-2 tablets by mouth every 6 (six) hours as needed., Disp: 30 tablet, Rfl: 0 .  metoprolol (LOPRESSOR) 50 MG tablet, TAKE 1 TABLET TWICE DAILY (Patient taking differently: TAKE 50 MG BY MOUTH TWICE DAILY), Disp: 180 tablet, Rfl: 0 .  pantoprazole (PROTONIX) 40 MG tablet, Take 1 tablet (40 mg total) by mouth daily., Disp: 90 tablet, Rfl: 1 .  ranitidine (ZANTAC) 150 MG capsule, Take 150 mg by mouth at bedtime. , Disp: , Rfl:  .  simvastatin (ZOCOR) 40 MG tablet, Take 40 mg by mouth daily. , Disp: , Rfl:  .  triamterene-hydrochlorothiazide (DYAZIDE) 37.5-25 MG per capsule, Take 1 capsule by mouth daily., Disp: , Rfl:  .  VENTOLIN HFA 108 (90 BASE)  MCG/ACT inhaler, Inhale 1-2 puffs into the lungs every 4 (four) hours as needed for wheezing or shortness of breath. , Disp: , Rfl:   Allergies  Allergen Reactions  . Fentanyl Other (See Comments)    urine retention  . Penicillins Itching and Rash     Review of Systems  Constitutional: Positive for irritability. Negative for fever and chills.  Cardiovascular: Negative for leg swelling.  Psychiatric/Behavioral: Negative for depression. The patient is nervous/anxious and has insomnia.    Objective  Filed Vitals:   01/29/15 1418  BP: 128/70  Pulse: 62  Temp: 98.9 F (37.2 C)  TempSrc: Oral  Resp: 18  Height: 5\' 2"  (1.575 m)  SpO2: 93%    Physical Exam  Constitutional: She is oriented to person, place, and time and well-developed, well-nourished, and in no distress.  Cardiovascular: Normal rate and regular rhythm.   Pulmonary/Chest: Effort normal and breath sounds normal.  Abdominal:  Surgical incision from Laparoscopic Nephrectomy.  Musculoskeletal:  Left AKA  Neurological: She is alert and oriented to person, place, and time.  Nursing note and vitals reviewed.  Assessment & Plan  1. Need for immunization against influenza Vaccine against influenza administered  2. Phantom limb syndrome with pain Phantom limb pain is stable and controlled on opioid therapy. Patient is aware of the dependence potential and the side effects of opioids. Refills provided and follow-up in one month. - HYDROcodone-acetaminophen (NORCO) 7.5-325 MG per tablet; Take 1 tablet by mouth 3 (three) times daily as needed.  Dispense: 90 tablet; Refill: 0  3. Anxiety Symptoms of anxiety are stable and controlled on present benzodiazepine therapy. Patient is aware of the dependence potential and interactions of benzodiazepines. Refills provided and follow-up in one month. - ALPRAZolam (XANAX) 0.5 MG tablet; Take 1 tablet (0.5 mg total) by mouth 3 (three) times daily as needed for anxiety.  Dispense: 90  tablet; Refill: 0  4. S/p nephrectomy Postop follow-up with surgery. Patient doing well.   Halana Deisher [facility] 01/29/2015 2:37 PM

## 2015-01-31 ENCOUNTER — Ambulatory Visit: Payer: Commercial Managed Care - HMO | Admitting: Family Medicine

## 2015-02-07 DIAGNOSIS — D631 Anemia in chronic kidney disease: Secondary | ICD-10-CM | POA: Diagnosis not present

## 2015-02-07 DIAGNOSIS — I10 Essential (primary) hypertension: Secondary | ICD-10-CM | POA: Diagnosis not present

## 2015-02-07 DIAGNOSIS — R809 Proteinuria, unspecified: Secondary | ICD-10-CM | POA: Diagnosis not present

## 2015-02-07 DIAGNOSIS — E1122 Type 2 diabetes mellitus with diabetic chronic kidney disease: Secondary | ICD-10-CM | POA: Diagnosis not present

## 2015-02-07 DIAGNOSIS — N2581 Secondary hyperparathyroidism of renal origin: Secondary | ICD-10-CM | POA: Diagnosis not present

## 2015-02-07 DIAGNOSIS — N183 Chronic kidney disease, stage 3 (moderate): Secondary | ICD-10-CM | POA: Diagnosis not present

## 2015-02-13 ENCOUNTER — Other Ambulatory Visit: Payer: Self-pay | Admitting: Family Medicine

## 2015-02-28 ENCOUNTER — Ambulatory Visit: Payer: Commercial Managed Care - HMO | Admitting: Family Medicine

## 2015-03-13 ENCOUNTER — Encounter: Payer: Self-pay | Admitting: Family Medicine

## 2015-03-13 ENCOUNTER — Ambulatory Visit (INDEPENDENT_AMBULATORY_CARE_PROVIDER_SITE_OTHER): Payer: Commercial Managed Care - HMO | Admitting: Family Medicine

## 2015-03-13 VITALS — BP 112/74 | HR 54 | Temp 97.9°F | Resp 12

## 2015-03-13 DIAGNOSIS — K219 Gastro-esophageal reflux disease without esophagitis: Secondary | ICD-10-CM | POA: Diagnosis not present

## 2015-03-13 DIAGNOSIS — F419 Anxiety disorder, unspecified: Secondary | ICD-10-CM

## 2015-03-13 DIAGNOSIS — G546 Phantom limb syndrome with pain: Secondary | ICD-10-CM | POA: Insufficient documentation

## 2015-03-13 MED ORDER — ALPRAZOLAM 0.5 MG PO TABS: 0.5000 mg | ORAL_TABLET | Freq: Three times a day (TID) | ORAL | Status: DC | PRN

## 2015-03-13 MED ORDER — RANITIDINE HCL 150 MG PO CAPS: 150.0000 mg | ORAL_CAPSULE | Freq: Every day | ORAL | Status: DC

## 2015-03-13 NOTE — Progress Notes (Signed)
Name: Amy [name]   MRN: [id number]    DOB: 1936-05-09   Date:03/13/2015       Progress Note  Subjective  Chief Complaint  Chief Complaint  Patient presents with  . Medication Refill    patient needs a refill of her Xanax (0.5mg ), Vicodin (7.5/325), Vitamin D (50,000) and Protonix (40mg )   Anxiety Presents for follow-up visit. Symptoms include excessive worry, insomnia and nervous/anxious behavior. Patient reports no panic.   Past treatments include benzodiazephines.  Gastroesophageal Reflux She complains of heartburn. This is a chronic problem. The problem has been gradually improving. She has tried a histamine-2 antagonist for the symptoms. The treatment provided significant relief.   Past Medical History  Diagnosis Date  . Chronic kidney disease   . Hyperlipidemia   . GERD (gastroesophageal reflux disease)   . Anxiety   . Depression   . Diabetes mellitus without complication ([place])   . Peripheral artery disease ([place])   . Hypertension   . Obesity   . Bronchitis     hx of   . History of frequent urinary tract infections   . Urinary incontinence   . Urinary frequency   . Left renal mass   . Cataract   . Numbness and tingling     right hand   . DVT (deep venous thrombosis) ([facility])     hx of in left leg currently has left AKA   . Dry cough     Past Surgical History  Procedure Laterality Date  . Leg amputation      above the knee / left   . Abdominal hysterectomy  1978  . Cholecystectomy    . Carpal tunnel release Bilateral   . Stent placement right leg     . Robot assisted laparoscopic nephrectomy Left 01/15/2015    Procedure: ROBOTIC ASSISTED LAPAROSCOPIC RADICAL NEPHRECTOMY;  Surgeon: Alexis Frock, MD;  Location: [facility];  Service: Urology;  Laterality: Left;    Family History  Problem Relation Age of Onset  . Cancer Mother     Stomach  . Cancer Father   . Diabetes Brother   . Cancer Brother     Social History   Social History  . Marital Status:  Married    Spouse Name: N/A  . Number of Children: N/A  . Years of Education: N/A   Occupational History  . Not on file.   Social History Main Topics  . Smoking status: Former Smoker -- 1.50 packs/day for 51 years    Types: Cigarettes    Quit date: 05/24/2004  . Smokeless tobacco: Never Used  . Alcohol Use: No  . Drug Use: No  . Sexual Activity: Not Currently   Other Topics Concern  . Not on file   Social History Narrative     Current outpatient prescriptions:  .  ALPRAZolam (XANAX) 0.5 MG tablet, Take 1 tablet (0.5 mg total) by mouth 3 (three) times daily as needed for anxiety., Disp: 90 tablet, Rfl: 0 .  citalopram (CELEXA) 20 MG tablet, Take 20 mg by mouth at bedtime. , Disp: , Rfl:  .  clopidogrel (PLAVIX) 75 MG tablet, , Disp: , Rfl:  .  docusate sodium (COLACE) 100 MG capsule, Take 1 capsule (100 mg total) by mouth 2 (two) times daily., Disp: 30 capsule, Rfl: 0 .  gabapentin (NEURONTIN) 300 MG capsule, Take 300 mg by mouth 3 (three) times daily. , Disp: , Rfl:  .  HYDROcodone-acetaminophen (NORCO) 7.5-325 MG per tablet, Take 1 tablet  by mouth 3 (three) times daily as needed., Disp: 90 tablet, Rfl: 0 .  HYDROcodone-acetaminophen (NORCO/VICODIN) 5-325 MG per tablet, Take 1-2 tablets by mouth every 6 (six) hours as needed., Disp: 30 tablet, Rfl: 0 .  metoprolol (LOPRESSOR) 50 MG tablet, TAKE 1 TABLET TWICE DAILY, Disp: 180 tablet, Rfl: 0 .  pantoprazole (PROTONIX) 40 MG tablet, Take 1 tablet (40 mg total) by mouth daily., Disp: 90 tablet, Rfl: 1 .  ranitidine (ZANTAC) 150 MG capsule, Take 150 mg by mouth at bedtime. , Disp: , Rfl:  .  simvastatin (ZOCOR) 40 MG tablet, Take 40 mg by mouth daily. , Disp: , Rfl:  .  VENTOLIN HFA 108 (90 BASE) MCG/ACT inhaler, Inhale 1-2 puffs into the lungs every 4 (four) hours as needed for wheezing or shortness of breath. , Disp: , Rfl:  .  Vitamin D, Ergocalciferol, (DRISDOL) 50000 UNITS CAPS capsule, , Disp: , Rfl:   Allergies  Allergen  Reactions  . Fentanyl Other (See Comments)    urine retention  . Penicillins Itching and Rash   Review of Systems  Gastrointestinal: Positive for heartburn.  Psychiatric/Behavioral: Positive for depression. The patient is nervous/anxious and has insomnia.    Objective  Filed Vitals:   03/13/15 1447  BP: 112/74  Pulse: 54  Temp: 97.9 F (36.6 C)  TempSrc: Oral  Resp: 12  SpO2: 94%    Physical Exam  Constitutional: She is oriented to person, place, and time and well-developed, well-nourished, and in no distress.  Cardiovascular: Normal rate and regular rhythm.   Pulmonary/Chest: Effort normal and breath sounds normal.  Abdominal: Soft. Bowel sounds are normal. There is no tenderness.  Neurological: She is alert and oriented to person, place, and time.  Psychiatric: Memory, affect and judgment normal.  Nursing note and vitals reviewed.   Assessment & Plan  1. Anxiety Symptoms stable and controlled on alprazolam taken daily. Refills provided. - ALPRAZolam (XANAX) 0.5 MG tablet; Take 1 tablet (0.5 mg total) by mouth 3 (three) times daily as needed for anxiety.  Dispense: 90 tablet; Refill: 0  2. Gastroesophageal reflux disease, esophagitis presence not specified Patient has stopped PPI because of the interaction with Plavix. Symptoms adequately controlled on ranitidine. Refills provided - ranitidine (ZANTAC) 150 MG capsule; Take 1 capsule (150 mg total) by mouth at bedtime.  Dispense: 90 capsule; Refill: 1   Amy [name] [facility] 03/13/2015 3:25 PM

## 2015-03-21 DIAGNOSIS — N183 Chronic kidney disease, stage 3 (moderate): Secondary | ICD-10-CM | POA: Diagnosis not present

## 2015-03-21 DIAGNOSIS — R809 Proteinuria, unspecified: Secondary | ICD-10-CM | POA: Diagnosis not present

## 2015-03-21 DIAGNOSIS — I10 Essential (primary) hypertension: Secondary | ICD-10-CM | POA: Diagnosis not present

## 2015-03-21 DIAGNOSIS — N2581 Secondary hyperparathyroidism of renal origin: Secondary | ICD-10-CM | POA: Diagnosis not present

## 2015-04-02 DIAGNOSIS — N183 Chronic kidney disease, stage 3 (moderate): Secondary | ICD-10-CM | POA: Diagnosis not present

## 2015-04-02 DIAGNOSIS — I10 Essential (primary) hypertension: Secondary | ICD-10-CM | POA: Diagnosis not present

## 2015-04-02 DIAGNOSIS — N2889 Other specified disorders of kidney and ureter: Secondary | ICD-10-CM | POA: Diagnosis not present

## 2015-04-09 ENCOUNTER — Ambulatory Visit: Payer: Commercial Managed Care - HMO | Admitting: Family Medicine

## 2015-04-14 ENCOUNTER — Ambulatory Visit: Payer: Commercial Managed Care - HMO | Admitting: Family Medicine

## 2015-04-14 ENCOUNTER — Encounter: Payer: Self-pay | Admitting: Family Medicine

## 2015-04-14 ENCOUNTER — Ambulatory Visit (INDEPENDENT_AMBULATORY_CARE_PROVIDER_SITE_OTHER): Payer: Commercial Managed Care - HMO | Admitting: Family Medicine

## 2015-04-14 VITALS — BP 117/64 | HR 54 | Temp 97.7°F | Resp 16 | Ht 62.0 in | Wt 160.0 lb

## 2015-04-14 DIAGNOSIS — F419 Anxiety disorder, unspecified: Secondary | ICD-10-CM

## 2015-04-14 DIAGNOSIS — G546 Phantom limb syndrome with pain: Secondary | ICD-10-CM | POA: Diagnosis not present

## 2015-04-14 MED ORDER — HYDROCODONE-ACETAMINOPHEN 7.5-325 MG PO TABS: 1.0000 | ORAL_TABLET | Freq: Three times a day (TID) | ORAL | Status: DC | PRN

## 2015-04-14 MED ORDER — ALPRAZOLAM 0.5 MG PO TABS: 0.5000 mg | ORAL_TABLET | Freq: Three times a day (TID) | ORAL | Status: DC | PRN

## 2015-04-14 NOTE — Progress Notes (Signed)
Name: Amy [name]   MRN: [id number]    DOB: Jul 10, 1935   Date:04/14/2015       Progress Note  Subjective  Chief Complaint  Chief Complaint  Patient presents with  . Medication Refill    1 month F/U  . Anxiety    Well Controlled with medication taking once tablet three times daily   . Knee Pain    Well controlled with medication, has left knee pain constantly.  . Gastroesophageal Reflux    Controlling symptom with the new medication Zantac.    Anxiety Presents for follow-up visit. The problem has been unchanged. Symptoms include excessive worry, insomnia, nervous/anxious behavior and panic. Patient reports no chest pain or shortness of breath. The severity of symptoms is moderate.   Past treatments include benzodiazephines. Compliance with prior treatments has been good.     Past Medical History  Diagnosis Date  . Chronic kidney disease   . Hyperlipidemia   . GERD (gastroesophageal reflux disease)   . Anxiety   . Depression   . Diabetes mellitus without complication ([place])   . Peripheral artery disease ([place])   . Hypertension   . Obesity   . Bronchitis     hx of   . History of frequent urinary tract infections   . Urinary incontinence   . Urinary frequency   . Left renal mass   . Cataract   . Numbness and tingling     right hand   . DVT (deep venous thrombosis) (HCC)     hx of in left leg currently has left AKA   . Dry cough     Past Surgical History  Procedure Laterality Date  . Leg amputation      above the knee / left   . Abdominal hysterectomy  1978  . Cholecystectomy    . Carpal tunnel release Bilateral   . Stent placement right leg     . Robot assisted laparoscopic nephrectomy Left 01/15/2015    Procedure: ROBOTIC ASSISTED LAPAROSCOPIC RADICAL NEPHRECTOMY;  Surgeon: Alexis Frock, MD;  Location: [facility];  Service: Urology;  Laterality: Left;    Family History  Problem Relation Age of Onset  . Cancer Mother     Stomach  . Cancer Father   .  Diabetes Brother   . Cancer Brother     Social History   Social History  . Marital Status: Married    Spouse Name: N/A  . Number of Children: N/A  . Years of Education: N/A   Occupational History  . Not on file.   Social History Main Topics  . Smoking status: Former Smoker -- 1.50 packs/day for 51 years    Types: Cigarettes    Quit date: 05/24/2004  . Smokeless tobacco: Never Used  . Alcohol Use: No  . Drug Use: No  . Sexual Activity: Not Currently   Other Topics Concern  . Not on file   Social History Narrative    Current outpatient prescriptions:  .  ALPRAZolam (XANAX) 0.5 MG tablet, Take 1 tablet (0.5 mg total) by mouth 3 (three) times daily as needed for anxiety., Disp: 90 tablet, Rfl: 0 .  citalopram (CELEXA) 20 MG tablet, Take 20 mg by mouth at bedtime. , Disp: , Rfl:  .  clopidogrel (PLAVIX) 75 MG tablet, , Disp: , Rfl:  .  gabapentin (NEURONTIN) 300 MG capsule, Take 300 mg by mouth 3 (three) times daily. , Disp: , Rfl:  .  HYDROcodone-acetaminophen (NORCO) 7.5-325 MG per tablet,  Take 1 tablet by mouth 3 (three) times daily as needed., Disp: 90 tablet, Rfl: 0 .  HYDROcodone-acetaminophen (NORCO/VICODIN) 5-325 MG per tablet, Take 1-2 tablets by mouth every 6 (six) hours as needed., Disp: 30 tablet, Rfl: 0 .  lisinopril (PRINIVIL,ZESTRIL) 5 MG tablet, Take 1 tablet by mouth daily., Disp: , Rfl:  .  metoprolol (LOPRESSOR) 50 MG tablet, TAKE 1 TABLET TWICE DAILY, Disp: 180 tablet, Rfl: 0 .  ranitidine (ZANTAC) 150 MG capsule, Take 1 capsule (150 mg total) by mouth at bedtime., Disp: 90 capsule, Rfl: 1 .  simvastatin (ZOCOR) 40 MG tablet, Take 40 mg by mouth daily. , Disp: , Rfl:  .  VENTOLIN HFA 108 (90 BASE) MCG/ACT inhaler, Inhale 1-2 puffs into the lungs every 4 (four) hours as needed for wheezing or shortness of breath. , Disp: , Rfl:  .  Vitamin D, Ergocalciferol, (DRISDOL) 50000 UNITS CAPS capsule, , Disp: , Rfl:   Allergies  Allergen Reactions  . Fentanyl Other  (See Comments)    urine retention  . Penicillins Itching and Rash     Review of Systems  Respiratory: Negative for shortness of breath.   Cardiovascular: Negative for chest pain.  Psychiatric/Behavioral: The patient is nervous/anxious and has insomnia.      Objective  Filed Vitals:   04/14/15 1153  BP: 117/64  Pulse: 54  Temp: 97.7 F (36.5 C)  TempSrc: Oral  Resp: 16  Height: 5\' 2"  (1.575 m)  Weight: 160 lb (72.576 kg)  SpO2: 96%    Physical Exam  Constitutional: She is oriented to person, place, and time and well-developed, well-nourished, and in no distress.  HENT:  Head: Normocephalic and atraumatic.  Cardiovascular: Normal rate and regular rhythm.   Pulmonary/Chest: Effort normal and breath sounds normal.  Musculoskeletal:  Left AKA.  Neurological: She is alert and oriented to person, place, and time.  Psychiatric: Mood, memory, affect and judgment normal.  Nursing note and vitals reviewed.   Assessment & Plan  1. Phantom limb syndrome with pain (HCC) Controlled on Vicodin and Gabapentin taken daily as needed. - HYDROcodone-acetaminophen (NORCO) 7.5-325 MG tablet; Take 1 tablet by mouth 3 (three) times daily as needed.  Dispense: 90 tablet; Refill: 0  2. Anxiety Stable on alprazolam 0.5 mg 3 times daily as needed. Refills provided. - ALPRAZolam (XANAX) 0.5 MG tablet; Take 1 tablet (0.5 mg total) by mouth 3 (three) times daily as needed for anxiety.  Dispense: 90 tablet; Refill: 0   Amy [name] A. [facility] 04/14/2015 12:42 PM

## 2015-05-10 ENCOUNTER — Other Ambulatory Visit: Payer: Self-pay | Admitting: Family Medicine

## 2015-05-14 ENCOUNTER — Telehealth: Payer: Self-pay | Admitting: Family Medicine

## 2015-05-14 NOTE — Telephone Encounter (Signed)
Pt would like a call back about her medication that goes to [facility]. Pt is confused on how it is written. Please call pt back.

## 2015-05-14 NOTE — Telephone Encounter (Signed)
Returned patient call and left message on voicemail asking her to return call concerning her medication being sent to [facility]

## 2015-05-14 NOTE — Telephone Encounter (Signed)
Medication has been refilled and sent to [facility]

## 2015-05-15 ENCOUNTER — Other Ambulatory Visit: Payer: Self-pay

## 2015-05-15 MED ORDER — SIMVASTATIN 40 MG PO TABS: 40.0000 mg | ORAL_TABLET | Freq: Every day | ORAL | Status: DC

## 2015-05-15 MED ORDER — CITALOPRAM HYDROBROMIDE 20 MG PO TABS: 20.0000 mg | ORAL_TABLET | Freq: Every day | ORAL | Status: DC

## 2015-05-15 MED ORDER — GABAPENTIN 300 MG PO CAPS: 300.0000 mg | ORAL_CAPSULE | Freq: Three times a day (TID) | ORAL | Status: DC

## 2015-05-15 MED ORDER — CLOPIDOGREL BISULFATE 75 MG PO TABS: 75.0000 mg | ORAL_TABLET | Freq: Once | ORAL | Status: DC

## 2015-05-15 MED ORDER — LISINOPRIL 5 MG PO TABS: 5.0000 mg | ORAL_TABLET | Freq: Every day | ORAL | Status: DC

## 2015-05-20 ENCOUNTER — Encounter: Payer: Self-pay | Admitting: Family Medicine

## 2015-05-20 ENCOUNTER — Ambulatory Visit (INDEPENDENT_AMBULATORY_CARE_PROVIDER_SITE_OTHER): Payer: Commercial Managed Care - HMO | Admitting: Family Medicine

## 2015-05-20 VITALS — BP 118/66 | HR 58 | Temp 100.0°F | Resp 18

## 2015-05-20 DIAGNOSIS — E119 Type 2 diabetes mellitus without complications: Secondary | ICD-10-CM | POA: Diagnosis not present

## 2015-05-20 DIAGNOSIS — J029 Acute pharyngitis, unspecified: Secondary | ICD-10-CM | POA: Insufficient documentation

## 2015-05-20 DIAGNOSIS — G546 Phantom limb syndrome with pain: Secondary | ICD-10-CM

## 2015-05-20 DIAGNOSIS — F419 Anxiety disorder, unspecified: Secondary | ICD-10-CM

## 2015-05-20 DIAGNOSIS — E785 Hyperlipidemia, unspecified: Secondary | ICD-10-CM

## 2015-05-20 LAB — POCT INFLUENZA A/B
INFLUENZA B, POC: NEGATIVE
Influenza A, POC: NEGATIVE

## 2015-05-20 LAB — POCT GLYCOSYLATED HEMOGLOBIN (HGB A1C): HEMOGLOBIN A1C: 5.5

## 2015-05-20 LAB — GLUCOSE, POCT (MANUAL RESULT ENTRY): POC Glucose: 82 mg/dl (ref 70–99)

## 2015-05-20 MED ORDER — HYDROCODONE-ACETAMINOPHEN 7.5-325 MG PO TABS: 1.0000 | ORAL_TABLET | Freq: Three times a day (TID) | ORAL | Status: DC | PRN

## 2015-05-20 MED ORDER — ALPRAZOLAM 0.5 MG PO TABS: 0.5000 mg | ORAL_TABLET | Freq: Three times a day (TID) | ORAL | Status: DC | PRN

## 2015-05-20 NOTE — Progress Notes (Signed)
Name: Amy [name]   MRN: [id number]    DOB: Jul 29, 1935   Date:05/20/2015       Progress Note  Subjective  Chief Complaint  Chief Complaint  Patient presents with  . Follow-up    1 mo  . Diabetes    Diabetes She presents for her follow-up diabetic visit. She has type 2 diabetes mellitus. Her disease course has been stable. Hypoglycemia symptoms include nervousness/anxiousness. Pertinent negatives for diabetes include no chest pain, no fatigue, no foot paresthesias, no polydipsia, no polyphagia and no polyuria. Current diabetic treatment includes diet. Her breakfast blood glucose range is generally 70-90 mg/dl.  Anxiety Presents for follow-up visit. The problem has been unchanged. Symptoms include excessive worry, insomnia, nervous/anxious behavior and panic. Patient reports no chest pain or shortness of breath. The severity of symptoms is moderate.   Past treatments include benzodiazephines. Compliance with prior treatments has been good.  Sore Throat  This is a new problem. The current episode started yesterday. There has been no fever (Temp of 100 F). Associated symptoms include coughing. Pertinent negatives include no ear discharge, neck pain, shortness of breath or trouble swallowing. She has had no exposure to mono. Treatments tried: Took Theraflu this AM.   Phantom Limb Syndrome Pt. Has chronic pain in her left leg, s/p R. AKA. Pain relieved on opioid therapy and Gabapentin. Current pain is 3/10. Pain in her leg mainly at night.    Past Medical History  Diagnosis Date  . Chronic kidney disease   . Hyperlipidemia   . GERD (gastroesophageal reflux disease)   . Anxiety   . Depression   . Diabetes mellitus without complication ([place])   . Peripheral artery disease ([place])   . Hypertension   . Obesity   . Bronchitis     hx of   . History of frequent urinary tract infections   . Urinary incontinence   . Urinary frequency   . Left renal mass   . Cataract   . Numbness  and tingling     right hand   . DVT (deep venous thrombosis) (HCC)     hx of in left leg currently has left AKA   . Dry cough     Past Surgical History  Procedure Laterality Date  . Leg amputation      above the knee / left   . Abdominal hysterectomy  1978  . Cholecystectomy    . Carpal tunnel release Bilateral   . Stent placement right leg     . Robot assisted laparoscopic nephrectomy Left 01/15/2015    Procedure: ROBOTIC ASSISTED LAPAROSCOPIC RADICAL NEPHRECTOMY;  Surgeon: Alexis Frock, MD;  Location: [facility];  Service: Urology;  Laterality: Left;    Family History  Problem Relation Age of Onset  . Cancer Mother     Stomach  . Cancer Father   . Diabetes Brother   . Cancer Brother     Social History   Social History  . Marital Status: Married    Spouse Name: N/A  . Number of Children: N/A  . Years of Education: N/A   Occupational History  . Not on file.   Social History Main Topics  . Smoking status: Former Smoker -- 1.50 packs/day for 51 years    Types: Cigarettes    Quit date: 05/24/2004  . Smokeless tobacco: Never Used  . Alcohol Use: No  . Drug Use: No  . Sexual Activity: Not Currently   Other Topics Concern  . Not on file  Social History Narrative     Current outpatient prescriptions:  .  ALPRAZolam (XANAX) 0.5 MG tablet, Take 1 tablet (0.5 mg total) by mouth 3 (three) times daily as needed for anxiety., Disp: 90 tablet, Rfl: 0 .  citalopram (CELEXA) 20 MG tablet, Take 1 tablet (20 mg total) by mouth at bedtime., Disp: 90 tablet, Rfl: 0 .  clopidogrel (PLAVIX) 75 MG tablet, Take 1 tablet (75 mg total) by mouth once., Disp: 90 tablet, Rfl: 0 .  gabapentin (NEURONTIN) 300 MG capsule, Take 1 capsule (300 mg total) by mouth 3 (three) times daily., Disp: 90 capsule, Rfl: 0 .  HYDROcodone-acetaminophen (NORCO) 7.5-325 MG tablet, Take 1 tablet by mouth 3 (three) times daily as needed., Disp: 90 tablet, Rfl: 0 .  lisinopril (PRINIVIL,ZESTRIL) 5 MG tablet,  Take 1 tablet (5 mg total) by mouth daily., Disp: 90 tablet, Rfl: 0 .  metoprolol (LOPRESSOR) 50 MG tablet, Take 1 tablet (50 mg total) by mouth 2 (two) times daily., Disp: 180 tablet, Rfl: 0 .  pantoprazole (PROTONIX) 40 MG tablet, Take 1 tablet (40 mg total) by mouth daily., Disp: 90 tablet, Rfl: 1 .  ranitidine (ZANTAC) 150 MG capsule, Take 1 capsule (150 mg total) by mouth at bedtime., Disp: 90 capsule, Rfl: 1 .  simvastatin (ZOCOR) 40 MG tablet, Take 1 tablet (40 mg total) by mouth daily., Disp: 90 tablet, Rfl: 0 .  VENTOLIN HFA 108 (90 BASE) MCG/ACT inhaler, Inhale 1-2 puffs into the lungs every 4 (four) hours as needed for wheezing or shortness of breath. , Disp: , Rfl:  .  Vitamin D, Ergocalciferol, (DRISDOL) 50000 UNITS CAPS capsule, , Disp: , Rfl:   Allergies  Allergen Reactions  . Fentanyl Other (See Comments)    urine retention  . Penicillins Itching and Rash     Review of Systems  Constitutional: Positive for fever, chills and malaise/fatigue. Negative for fatigue.  HENT: Positive for sore throat. Negative for ear discharge and trouble swallowing.   Respiratory: Positive for cough. Negative for sputum production and shortness of breath.   Cardiovascular: Negative for chest pain.  Musculoskeletal: Positive for myalgias. Negative for neck pain.  Endo/Heme/Allergies: Negative for polydipsia and polyphagia.  Psychiatric/Behavioral: Positive for depression. The patient is nervous/anxious and has insomnia.     Objective  Filed Vitals:   05/20/15 1053  BP: 118/66  Pulse: 58  Temp: 100 F (37.8 C)  TempSrc: Oral  Resp: 18  SpO2: 95%    Physical Exam  Constitutional: She is oriented to person, place, and time and well-developed, well-nourished, and in no distress.  HENT:  Head: Normocephalic and atraumatic.  Mouth/Throat: Posterior oropharyngeal erythema present. No oropharyngeal exudate.  L. Ear cerumen impaction.   Neck: Neck supple.  Cardiovascular: Normal rate,  regular rhythm and normal heart sounds.   Pulmonary/Chest: Effort normal and breath sounds normal.  Neurological: She is alert and oriented to person, place, and time.  Nursing note and vitals reviewed.  Assessment & Plan  1. Controlled type 2 diabetes mellitus without complication, without long-term current use of insulin (HCC) A1c at goal, consistent with well-controlled diabetes mellitus. - POCT HgB A1C - POCT Glucose (CBG)  2. Anxiety  - ALPRAZolam (XANAX) 0.5 MG tablet; Take 1 tablet (0.5 mg total) by mouth 3 (three) times daily as needed for anxiety.  Dispense: 90 tablet; Refill: 0  3. Phantom limb syndrome with pain (HCC) Pain is stable and controlled on present opioid therapy. - HYDROcodone-acetaminophen (NORCO) 7.5-325 MG tablet; Take 1  tablet by mouth 3 (three) times daily as needed.  Dispense: 90 tablet; Refill: 0  4. HLD (hyperlipidemia)  - Lipid Profile - Comprehensive Metabolic Panel (CMET)  5. Sore throat Rapid flu test is negative. Likely viral pharyngitis. Advised on conservative measures. Follow-up if no clinical improvement. - POCT Influenza A/B    Amy [name] Asad A. [facility] 05/20/2015 11:10 AM

## 2015-05-27 DIAGNOSIS — I10 Essential (primary) hypertension: Secondary | ICD-10-CM | POA: Diagnosis not present

## 2015-05-27 DIAGNOSIS — I739 Peripheral vascular disease, unspecified: Secondary | ICD-10-CM | POA: Diagnosis not present

## 2015-05-27 DIAGNOSIS — E119 Type 2 diabetes mellitus without complications: Secondary | ICD-10-CM | POA: Diagnosis not present

## 2015-05-27 DIAGNOSIS — E785 Hyperlipidemia, unspecified: Secondary | ICD-10-CM | POA: Diagnosis not present

## 2015-05-27 DIAGNOSIS — I70211 Atherosclerosis of native arteries of extremities with intermittent claudication, right leg: Secondary | ICD-10-CM | POA: Diagnosis not present

## 2015-06-13 DIAGNOSIS — I129 Hypertensive chronic kidney disease with stage 1 through stage 4 chronic kidney disease, or unspecified chronic kidney disease: Secondary | ICD-10-CM | POA: Diagnosis not present

## 2015-06-13 DIAGNOSIS — E1122 Type 2 diabetes mellitus with diabetic chronic kidney disease: Secondary | ICD-10-CM | POA: Diagnosis not present

## 2015-06-13 DIAGNOSIS — E785 Hyperlipidemia, unspecified: Secondary | ICD-10-CM | POA: Diagnosis not present

## 2015-06-13 DIAGNOSIS — N183 Chronic kidney disease, stage 3 (moderate): Secondary | ICD-10-CM | POA: Diagnosis not present

## 2015-06-13 DIAGNOSIS — R809 Proteinuria, unspecified: Secondary | ICD-10-CM | POA: Diagnosis not present

## 2015-06-13 DIAGNOSIS — N2581 Secondary hyperparathyroidism of renal origin: Secondary | ICD-10-CM | POA: Diagnosis not present

## 2015-06-18 ENCOUNTER — Encounter: Payer: Self-pay | Admitting: Family Medicine

## 2015-06-18 ENCOUNTER — Ambulatory Visit (INDEPENDENT_AMBULATORY_CARE_PROVIDER_SITE_OTHER): Payer: Commercial Managed Care - HMO | Admitting: Family Medicine

## 2015-06-18 VITALS — BP 118/73 | HR 52 | Temp 98.0°F

## 2015-06-18 DIAGNOSIS — G546 Phantom limb syndrome with pain: Secondary | ICD-10-CM | POA: Diagnosis not present

## 2015-06-18 DIAGNOSIS — F419 Anxiety disorder, unspecified: Secondary | ICD-10-CM | POA: Diagnosis not present

## 2015-06-18 MED ORDER — HYDROCODONE-ACETAMINOPHEN 7.5-325 MG PO TABS: 1.0000 | ORAL_TABLET | Freq: Three times a day (TID) | ORAL | Status: DC | PRN

## 2015-06-18 MED ORDER — ALPRAZOLAM 0.5 MG PO TABS: 0.5000 mg | ORAL_TABLET | Freq: Three times a day (TID) | ORAL | Status: DC | PRN

## 2015-06-18 NOTE — Progress Notes (Signed)
Name: Amy [name]   MRN: [id number]    DOB: May 14, 1936   Date:06/18/2015       Progress Note  Subjective  Chief Complaint  Chief Complaint  Patient presents with  . Follow-up    1 mo    Anxiety Presents for follow-up visit. The problem has been unchanged. Symptoms include excessive worry, insomnia, nervous/anxious behavior and panic. Patient reports no chest pain or shortness of breath. The severity of symptoms is moderate.   Her past medical history is significant for anxiety/panic attacks. Past treatments include benzodiazephines. Compliance with prior treatments has been good.   Phantom Limb Pain Pt. Is here for follow up and medication refill for Phantom Limb pain. Present since her left AKA in 2007. Feels pain in her left leg and foot and takes Hydrocodone 7.5-325 mg every 8 hours as needed. This usually relieves her pain, and has no obvious adverse effects.  Past Medical History  Diagnosis Date  . Chronic kidney disease   . Hyperlipidemia   . GERD (gastroesophageal reflux disease)   . Anxiety   . Depression   . Diabetes mellitus without complication ([place])   . Peripheral artery disease ([place])   . Hypertension   . Obesity   . Bronchitis     hx of   . History of frequent urinary tract infections   . Urinary incontinence   . Urinary frequency   . Left renal mass   . Cataract   . Numbness and tingling     right hand   . DVT (deep venous thrombosis) (HCC)     hx of in left leg currently has left AKA   . Dry cough     Past Surgical History  Procedure Laterality Date  . Leg amputation      above the knee / left   . Abdominal hysterectomy  1978  . Cholecystectomy    . Carpal tunnel release Bilateral   . Stent placement right leg     . Robot assisted laparoscopic nephrectomy Left 01/15/2015    Procedure: ROBOTIC ASSISTED LAPAROSCOPIC RADICAL NEPHRECTOMY;  Surgeon: Alexis Frock, MD;  Location: [facility];  Service: Urology;  Laterality: Left;    Family History    Problem Relation Age of Onset  . Cancer Mother     Stomach  . Cancer Father   . Diabetes Brother   . Cancer Brother     Social History   Social History  . Marital Status: Married    Spouse Name: N/A  . Number of Children: N/A  . Years of Education: N/A   Occupational History  . Not on file.   Social History Main Topics  . Smoking status: Former Smoker -- 1.50 packs/day for 51 years    Types: Cigarettes    Quit date: 05/24/2004  . Smokeless tobacco: Never Used  . Alcohol Use: No  . Drug Use: No  . Sexual Activity: Not Currently   Other Topics Concern  . Not on file   Social History Narrative     Current outpatient prescriptions:  .  ALPRAZolam (XANAX) 0.5 MG tablet, Take 1 tablet (0.5 mg total) by mouth 3 (three) times daily as needed for anxiety., Disp: 90 tablet, Rfl: 0 .  citalopram (CELEXA) 20 MG tablet, Take 1 tablet (20 mg total) by mouth at bedtime., Disp: 90 tablet, Rfl: 0 .  clopidogrel (PLAVIX) 75 MG tablet, Take 1 tablet (75 mg total) by mouth once., Disp: 90 tablet, Rfl: 0 .  gabapentin (NEURONTIN)  300 MG capsule, Take 1 capsule (300 mg total) by mouth 3 (three) times daily., Disp: 90 capsule, Rfl: 0 .  HYDROcodone-acetaminophen (NORCO) 7.5-325 MG tablet, Take 1 tablet by mouth 3 (three) times daily as needed., Disp: 90 tablet, Rfl: 0 .  lisinopril (PRINIVIL,ZESTRIL) 5 MG tablet, Take 1 tablet (5 mg total) by mouth daily., Disp: 90 tablet, Rfl: 0 .  metoprolol (LOPRESSOR) 50 MG tablet, Take 1 tablet (50 mg total) by mouth 2 (two) times daily., Disp: 180 tablet, Rfl: 0 .  pantoprazole (PROTONIX) 40 MG tablet, Take 1 tablet (40 mg total) by mouth daily., Disp: 90 tablet, Rfl: 1 .  ranitidine (ZANTAC) 150 MG capsule, Take 1 capsule (150 mg total) by mouth at bedtime., Disp: 90 capsule, Rfl: 1 .  simvastatin (ZOCOR) 40 MG tablet, Take 1 tablet (40 mg total) by mouth daily., Disp: 90 tablet, Rfl: 0 .  VENTOLIN HFA 108 (90 BASE) MCG/ACT inhaler, Inhale 1-2 puffs  into the lungs every 4 (four) hours as needed for wheezing or shortness of breath. , Disp: , Rfl:  .  Vitamin D, Ergocalciferol, (DRISDOL) 50000 UNITS CAPS capsule, , Disp: , Rfl:   Allergies  Allergen Reactions  . Fentanyl Other (See Comments)    urine retention  . Penicillins Itching and Rash     Review of Systems  Constitutional: Negative for fever and chills.  Respiratory: Negative for shortness of breath.   Cardiovascular: Negative for chest pain.  Musculoskeletal: Positive for myalgias.  Psychiatric/Behavioral: Negative for depression. The patient is nervous/anxious and has insomnia.     Objective  Filed Vitals:   06/18/15 1029  BP: 118/73  Pulse: 52  Temp: 98 F (36.7 C)  TempSrc: Oral  SpO2: 95%    Physical Exam  Constitutional: She is oriented to person, place, and time and well-developed, well-nourished, and in no distress.  Cardiovascular: Normal rate and regular rhythm.   Pulmonary/Chest: Effort normal and breath sounds normal.  Musculoskeletal:       Legs: Left above knee amputation  Neurological: She is alert and oriented to person, place, and time.  Psychiatric: Mood, memory, affect and judgment normal.  Nursing note and vitals reviewed.    Assessment & Plan  1. Anxiety Symptoms of anxiety respond well to alprazolam taken up to 3 times daily as needed. Refills provided. - ALPRAZolam (XANAX) 0.5 MG tablet; Take 1 tablet (0.5 mg total) by mouth 3 (three) times daily as needed for anxiety.  Dispense: 90 tablet; Refill: 0  2. Phantom limb syndrome with pain ([place]) Continue present therapy with hydrocodone for symptomatic relief of phantom limb pain. - HYDROcodone-acetaminophen (NORCO) 7.5-325 MG tablet; Take 1 tablet by mouth 3 (three) times daily as needed.  Dispense: 90 tablet; Refill: 0   Rai Severns Asad A. [facility] 06/18/2015 10:41 AM

## 2015-07-09 ENCOUNTER — Telehealth: Payer: Self-pay | Admitting: Family Medicine

## 2015-07-09 NOTE — Telephone Encounter (Signed)
Pt needs referral to go to Dr Tammi Klippel at [facility]. Her appt is 07/30/2015

## 2015-07-18 ENCOUNTER — Encounter: Payer: Self-pay | Admitting: Family Medicine

## 2015-07-18 ENCOUNTER — Ambulatory Visit (INDEPENDENT_AMBULATORY_CARE_PROVIDER_SITE_OTHER): Payer: Commercial Managed Care - HMO | Admitting: Family Medicine

## 2015-07-18 VITALS — BP 120/80 | HR 48 | Temp 98.8°F | Resp 16

## 2015-07-18 DIAGNOSIS — F419 Anxiety disorder, unspecified: Secondary | ICD-10-CM | POA: Diagnosis not present

## 2015-07-18 DIAGNOSIS — G546 Phantom limb syndrome with pain: Secondary | ICD-10-CM | POA: Diagnosis not present

## 2015-07-18 MED ORDER — ALPRAZOLAM 0.5 MG PO TABS: 0.5000 mg | ORAL_TABLET | Freq: Three times a day (TID) | ORAL | Status: DC | PRN

## 2015-07-18 MED ORDER — HYDROCODONE-ACETAMINOPHEN 7.5-325 MG PO TABS: 1.0000 | ORAL_TABLET | Freq: Three times a day (TID) | ORAL | Status: DC | PRN

## 2015-07-18 NOTE — Progress Notes (Signed)
Name: Amy [name]   MRN: [id number]    DOB: 1935-10-06   Date:07/18/2015       Progress Note  Subjective  Chief Complaint  Chief Complaint  Patient presents with  . Pain    medication refill    Anxiety Presents for follow-up visit. The problem has been unchanged. Symptoms include excessive worry, insomnia, irritability and nervous/anxious behavior. The quality of sleep is good (Takes naps during the day).   Past treatments include benzodiazephines and SSRIs. Compliance with prior treatments has been good.   Phantom Limb Pain Pt. Has chronic phantom limb pain, started after left AKA. Has pain in the region of the amputated limb, worse in the evening. She takes Hydrocodone-Acetaminophen 7.5-300 mg three times daily as needed. This provides relief from phantom limb pain, no reported adverse effects.  Past Medical History  Diagnosis Date  . Chronic kidney disease   . Hyperlipidemia   . GERD (gastroesophageal reflux disease)   . Anxiety   . Depression   . Diabetes mellitus without complication ([place])   . Peripheral artery disease ([place])   . Hypertension   . Obesity   . Bronchitis     hx of   . History of frequent urinary tract infections   . Urinary incontinence   . Urinary frequency   . Left renal mass   . Cataract   . Numbness and tingling     right hand   . DVT (deep venous thrombosis) (HCC)     hx of in left leg currently has left AKA   . Dry cough     Past Surgical History  Procedure Laterality Date  . Leg amputation      above the knee / left   . Abdominal hysterectomy  1978  . Cholecystectomy    . Carpal tunnel release Bilateral   . Stent placement right leg     . Robot assisted laparoscopic nephrectomy Left 01/15/2015    Procedure: ROBOTIC ASSISTED LAPAROSCOPIC RADICAL NEPHRECTOMY;  Surgeon: Alexis Frock, MD;  Location: [facility];  Service: Urology;  Laterality: Left;    Family History  Problem Relation Age of Onset  . Cancer Mother     Stomach  .  Cancer Father   . Diabetes Brother   . Cancer Brother     Social History   Social History  . Marital Status: Married    Spouse Name: N/A  . Number of Children: N/A  . Years of Education: N/A   Occupational History  . Not on file.   Social History Main Topics  . Smoking status: Former Smoker -- 1.50 packs/day for 51 years    Types: Cigarettes    Quit date: 05/24/2004  . Smokeless tobacco: Never Used  . Alcohol Use: No  . Drug Use: No  . Sexual Activity: Not Currently   Other Topics Concern  . Not on file   Social History Narrative     Current outpatient prescriptions:  .  ALPRAZolam (XANAX) 0.5 MG tablet, Take 1 tablet (0.5 mg total) by mouth 3 (three) times daily as needed for anxiety., Disp: 90 tablet, Rfl: 0 .  citalopram (CELEXA) 20 MG tablet, Take 1 tablet (20 mg total) by mouth at bedtime., Disp: 90 tablet, Rfl: 0 .  clopidogrel (PLAVIX) 75 MG tablet, Take 1 tablet (75 mg total) by mouth once., Disp: 90 tablet, Rfl: 0 .  gabapentin (NEURONTIN) 300 MG capsule, Take 1 capsule (300 mg total) by mouth 3 (three) times daily., Disp: 90  capsule, Rfl: 0 .  HYDROcodone-acetaminophen (NORCO) 7.5-325 MG tablet, Take 1 tablet by mouth 3 (three) times daily as needed., Disp: 90 tablet, Rfl: 0 .  lisinopril (PRINIVIL,ZESTRIL) 5 MG tablet, Take 1 tablet (5 mg total) by mouth daily., Disp: 90 tablet, Rfl: 0 .  metoprolol (LOPRESSOR) 50 MG tablet, Take 1 tablet (50 mg total) by mouth 2 (two) times daily., Disp: 180 tablet, Rfl: 0 .  pantoprazole (PROTONIX) 40 MG tablet, Take 1 tablet (40 mg total) by mouth daily., Disp: 90 tablet, Rfl: 1 .  ranitidine (ZANTAC) 150 MG capsule, Take 1 capsule (150 mg total) by mouth at bedtime., Disp: 90 capsule, Rfl: 1 .  simvastatin (ZOCOR) 40 MG tablet, Take 1 tablet (40 mg total) by mouth daily., Disp: 90 tablet, Rfl: 0 .  VENTOLIN HFA 108 (90 BASE) MCG/ACT inhaler, Inhale 1-2 puffs into the lungs every 4 (four) hours as needed for wheezing or  shortness of breath. , Disp: , Rfl:  .  Vitamin D, Ergocalciferol, (DRISDOL) 50000 UNITS CAPS capsule, , Disp: , Rfl:   Allergies  Allergen Reactions  . Fentanyl Other (See Comments)    urine retention  . Penicillins Itching and Rash     Review of Systems  Constitutional: Positive for irritability.  Psychiatric/Behavioral: The patient is nervous/anxious and has insomnia.      Objective  Filed Vitals:   07/18/15 1053  BP: 120/80  Pulse: 48  Temp: 98.8 F (37.1 C)  TempSrc: Oral  Resp: 16  SpO2: 93%    Physical Exam  Constitutional: She is oriented to person, place, and time and well-developed, well-nourished, and in no distress.  HENT:  Head: Normocephalic and atraumatic.  Cardiovascular: Normal rate and regular rhythm.   Pulmonary/Chest: Effort normal and breath sounds normal.  Neurological: She is alert and oriented to person, place, and time.  Nursing note and vitals reviewed.      Assessment & Plan  1. Anxiety Stable on alprazolam taken 3 times daily as needed. Patient compliant with controlled substances agreement and understands the dependence potential, drug interactions, and side effects of benzodiazepines. Refills provided - ALPRAZolam (XANAX) 0.5 MG tablet; Take 1 tablet (0.5 mg total) by mouth 3 (three) times daily as needed for anxiety.  Dispense: 90 tablet; Refill: 0  2. Phantom limb syndrome with pain (HCC) Stable and responsive to opioid therapy. Refills provided - HYDROcodone-acetaminophen (NORCO) 7.5-325 MG tablet; Take 1 tablet by mouth 3 (three) times daily as needed.  Dispense: 90 tablet; Refill: 0   Amy [name] Asad A[facility] 07/18/2015 11:16 AM

## 2015-07-30 ENCOUNTER — Ambulatory Visit (HOSPITAL_COMMUNITY)
Admission: RE | Admit: 2015-07-30 | Discharge: 2015-07-30 | Disposition: A | Discharge status: Home / Self Care | Payer: Commercial Managed Care - HMO | Source: Ambulatory Visit | Attending: Urology | Admitting: Urology

## 2015-07-30 ENCOUNTER — Other Ambulatory Visit: Payer: Self-pay | Admitting: Urology

## 2015-07-30 DIAGNOSIS — C772 Secondary and unspecified malignant neoplasm of intra-abdominal lymph nodes: Secondary | ICD-10-CM

## 2015-07-30 DIAGNOSIS — C969 Malignant neoplasm of lymphoid, hematopoietic and related tissue, unspecified: Secondary | ICD-10-CM | POA: Insufficient documentation

## 2015-07-30 DIAGNOSIS — I517 Cardiomegaly: Secondary | ICD-10-CM | POA: Insufficient documentation

## 2015-07-30 DIAGNOSIS — C642 Malignant neoplasm of left kidney, except renal pelvis: Secondary | ICD-10-CM

## 2015-07-30 DIAGNOSIS — R109 Unspecified abdominal pain: Secondary | ICD-10-CM | POA: Diagnosis not present

## 2015-07-30 DIAGNOSIS — C779 Secondary and unspecified malignant neoplasm of lymph node, unspecified: Secondary | ICD-10-CM | POA: Diagnosis not present

## 2015-07-30 DIAGNOSIS — Z0000 Encounter for general adult medical examination without abnormal findings: Secondary | ICD-10-CM | POA: Diagnosis not present

## 2015-07-30 DIAGNOSIS — C649 Malignant neoplasm of unspecified kidney, except renal pelvis: Secondary | ICD-10-CM | POA: Diagnosis not present

## 2015-07-30 DIAGNOSIS — C652 Malignant neoplasm of left renal pelvis: Secondary | ICD-10-CM | POA: Diagnosis not present

## 2015-07-30 DIAGNOSIS — C220 Liver cell carcinoma: Secondary | ICD-10-CM | POA: Diagnosis not present

## 2015-08-05 DIAGNOSIS — Z0000 Encounter for general adult medical examination without abnormal findings: Secondary | ICD-10-CM | POA: Diagnosis not present

## 2015-08-05 DIAGNOSIS — I714 Abdominal aortic aneurysm, without rupture: Secondary | ICD-10-CM | POA: Diagnosis not present

## 2015-08-05 DIAGNOSIS — C969 Malignant neoplasm of lymphoid, hematopoietic and related tissue, unspecified: Secondary | ICD-10-CM | POA: Diagnosis not present

## 2015-08-05 DIAGNOSIS — N184 Chronic kidney disease, stage 4 (severe): Secondary | ICD-10-CM | POA: Diagnosis not present

## 2015-08-05 DIAGNOSIS — Z905 Acquired absence of kidney: Secondary | ICD-10-CM | POA: Diagnosis not present

## 2015-08-05 DIAGNOSIS — C642 Malignant neoplasm of left kidney, except renal pelvis: Secondary | ICD-10-CM | POA: Diagnosis not present

## 2015-08-08 ENCOUNTER — Other Ambulatory Visit: Payer: Self-pay | Admitting: Family Medicine

## 2015-08-15 ENCOUNTER — Encounter: Payer: Self-pay | Admitting: Family Medicine

## 2015-08-15 ENCOUNTER — Ambulatory Visit (INDEPENDENT_AMBULATORY_CARE_PROVIDER_SITE_OTHER): Payer: Commercial Managed Care - HMO | Admitting: Family Medicine

## 2015-08-15 VITALS — BP 108/68 | HR 58 | Temp 97.7°F | Resp 18

## 2015-08-15 DIAGNOSIS — G546 Phantom limb syndrome with pain: Secondary | ICD-10-CM | POA: Diagnosis not present

## 2015-08-15 DIAGNOSIS — F419 Anxiety disorder, unspecified: Secondary | ICD-10-CM | POA: Diagnosis not present

## 2015-08-15 DIAGNOSIS — K219 Gastro-esophageal reflux disease without esophagitis: Secondary | ICD-10-CM | POA: Diagnosis not present

## 2015-08-15 MED ORDER — HYDROCODONE-ACETAMINOPHEN 7.5-325 MG PO TABS: 1.0000 | ORAL_TABLET | Freq: Three times a day (TID) | ORAL | Status: DC | PRN

## 2015-08-15 MED ORDER — GABAPENTIN 300 MG PO CAPS: 300.0000 mg | ORAL_CAPSULE | Freq: Three times a day (TID) | ORAL | Status: DC

## 2015-08-15 MED ORDER — RANITIDINE HCL 150 MG PO CAPS: 150.0000 mg | ORAL_CAPSULE | Freq: Every day | ORAL | Status: DC

## 2015-08-15 MED ORDER — PANTOPRAZOLE SODIUM 40 MG PO TBEC: 40.0000 mg | DELAYED_RELEASE_TABLET | Freq: Every day | ORAL | Status: DC

## 2015-08-15 MED ORDER — ALPRAZOLAM 0.5 MG PO TABS: 0.5000 mg | ORAL_TABLET | Freq: Three times a day (TID) | ORAL | Status: DC | PRN

## 2015-08-15 NOTE — Progress Notes (Signed)
Name: Amy [name]   MRN: [id number]    DOB: 06-Mar-1936   Date:08/15/2015       Progress Note  Subjective  Chief Complaint  Chief Complaint  Patient presents with  . Anxiety    pt here for 1 month follow up    HPI  Anxiety: Pt. Returns for refills on Alprazolam. Has history of anxiety, takes Alprazolam 0.5 mg three times daily as needed (one tab in AM and 2 tabs at bedtime). Alprazolam helps with anxiety and sleep.   Phantom Limb Pain: Pt. Returns for follow up of phantom limb pain. 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 Takes Hydrocodone-Acetaminophen 7.5-325 mg every 8 hours as needed. She takes medication as directed, no side effects. Aware of the dependence potential and drug interactions of opioids.   Past Medical History  Diagnosis Date  . Chronic kidney disease   . Hyperlipidemia   . GERD (gastroesophageal reflux disease)   . Anxiety   . Depression   . Diabetes mellitus without complication ([place])   . Peripheral artery disease ([place])   . Hypertension   . Obesity   . Bronchitis     hx of   . History of frequent urinary tract infections   . Urinary incontinence   . Urinary frequency   . Left renal mass   . Cataract   . Numbness and tingling     right hand   . DVT (deep venous thrombosis) (HCC)     hx of in left leg currently has left AKA   . Dry cough     Past Surgical History  Procedure Laterality Date  . Leg amputation      above the knee / left   . Abdominal hysterectomy  1978  . Cholecystectomy    . Carpal tunnel release Bilateral   . Stent placement right leg     . Robot assisted laparoscopic nephrectomy Left 01/15/2015    Procedure: ROBOTIC ASSISTED LAPAROSCOPIC RADICAL NEPHRECTOMY;  Surgeon: Alexis Frock, MD;  Location: [facility];  Service: Urology;  Laterality: Left;    Family History  Problem Relation Age of Onset  . Cancer Mother     Stomach  . Cancer Father   . Diabetes Brother   . Cancer Brother     Social History   Social History  .  Marital Status: Married    Spouse Name: N/A  . Number of Children: N/A  . Years of Education: N/A   Occupational History  . Not on file.   Social History Main Topics  . Smoking status: Former Smoker -- 1.50 packs/day for 51 years    Types: Cigarettes    Quit date: 05/24/2004  . Smokeless tobacco: Never Used  . Alcohol Use: No  . Drug Use: No  . Sexual Activity: Not Currently   Other Topics Concern  . Not on file   Social History Narrative     Current outpatient prescriptions:  .  ALPRAZolam (XANAX) 0.5 MG tablet, Take 1 tablet (0.5 mg total) by mouth 3 (three) times daily as needed for anxiety., Disp: 90 tablet, Rfl: 0 .  citalopram (CELEXA) 20 MG tablet, TAKE 1 TABLET  AT BEDTIME., Disp: 90 tablet, Rfl: 0 .  clopidogrel (PLAVIX) 75 MG tablet, TAKE 1 TABLET ONCE DAILY, Disp: 90 tablet, Rfl: 0 .  gabapentin (NEURONTIN) 300 MG capsule, Take 1 capsule (300 mg total) by mouth 3 (three) times daily., Disp: 90 capsule, Rfl: 0 .  HYDROcodone-acetaminophen (NORCO) 7.5-325 MG tablet, Take 1  tablet by mouth 3 (three) times daily as needed., Disp: 90 tablet, Rfl: 0 .  lisinopril (PRINIVIL,ZESTRIL) 5 MG tablet, TAKE 1 TABLET DAILY., Disp: 90 tablet, Rfl: 0 .  metoprolol (LOPRESSOR) 50 MG tablet, TAKE 1 TABLET TWICE DAILY, Disp: 180 tablet, Rfl: 0 .  pantoprazole (PROTONIX) 40 MG tablet, Take 1 tablet (40 mg total) by mouth daily., Disp: 90 tablet, Rfl: 1 .  ranitidine (ZANTAC) 150 MG capsule, Take 1 capsule (150 mg total) by mouth at bedtime., Disp: 90 capsule, Rfl: 1 .  simvastatin (ZOCOR) 40 MG tablet, TAKE 1 TABLET DAILY., Disp: 90 tablet, Rfl: 0 .  VENTOLIN HFA 108 (90 BASE) MCG/ACT inhaler, Inhale 1-2 puffs into the lungs every 4 (four) hours as needed for wheezing or shortness of breath. , Disp: , Rfl:  .  Vitamin D, Ergocalciferol, (DRISDOL) 50000 UNITS CAPS capsule, , Disp: , Rfl:   Allergies  Allergen Reactions  . Fentanyl Other (See Comments)    urine retention  . Penicillins  Itching and Rash     Review of Systems  Constitutional: Negative for fever and chills.  Respiratory: Positive for cough.   Cardiovascular: Negative for chest pain and palpitations.  Psychiatric/Behavioral: Positive for depression. The patient is nervous/anxious and has insomnia.         Objective  Filed Vitals:   08/15/15 1028  BP: 108/68  Pulse: 58  Temp: 97.7 F (36.5 C)  Resp: 18  SpO2: 94%    Physical Exam  Constitutional: She is oriented to person, place, and time and well-developed, well-nourished, and in no distress.  HENT:  Head: Normocephalic and atraumatic.  Cardiovascular: Normal rate and regular rhythm.   Pulmonary/Chest: Effort normal and breath sounds normal.  Abdominal: Soft. Bowel sounds are normal.  Musculoskeletal:  L. Above -Knee Amputation  Neurological: She is alert and oriented to person, place, and time.  Psychiatric: Mood, memory, affect and judgment normal.  Nursing note and vitals reviewed.      Assessment & Plan  1. Anxiety Stable on alprazolam taken 3 times daily as needed. Discussed drug interactions with benzodiazepines and opioids leading to a greater risk of adverse effects. Patient wishes to stay on alprazolam at this time. Refills provided - ALPRAZolam (XANAX) 0.5 MG tablet; Take 1 tablet (0.5 mg total) by mouth 3 (three) times daily as needed for anxiety.  Dispense: 90 tablet; Refill: 0  2. Phantom limb syndrome with pain (HCC) Pain responsive to hydrocodone taken up to 3 times daily as needed. Refills provided. Follow-up in one month. - HYDROcodone-acetaminophen (NORCO) 7.5-325 MG tablet; Take 1 tablet by mouth 3 (three) times daily as needed.  Dispense: 90 tablet; Refill: 0   Faithlyn Recktenwald [facility] 08/15/2015 11:30 AM

## 2015-08-27 ENCOUNTER — Ambulatory Visit (INDEPENDENT_AMBULATORY_CARE_PROVIDER_SITE_OTHER): Payer: Commercial Managed Care - HMO | Admitting: Family Medicine

## 2015-08-27 ENCOUNTER — Encounter: Payer: Self-pay | Admitting: Family Medicine

## 2015-08-27 VITALS — BP 108/71 | HR 60 | Temp 98.8°F | Resp 16 | Ht 62.0 in | Wt 160.0 lb

## 2015-08-27 DIAGNOSIS — E785 Hyperlipidemia, unspecified: Secondary | ICD-10-CM | POA: Diagnosis not present

## 2015-08-27 DIAGNOSIS — E1122 Type 2 diabetes mellitus with diabetic chronic kidney disease: Secondary | ICD-10-CM

## 2015-08-27 DIAGNOSIS — N183 Chronic kidney disease, stage 3 (moderate): Secondary | ICD-10-CM | POA: Diagnosis not present

## 2015-08-27 LAB — POCT GLYCOSYLATED HEMOGLOBIN (HGB A1C): HEMOGLOBIN A1C: 5.6

## 2015-08-27 NOTE — Progress Notes (Signed)
Name: Amy [name]   MRN: [id number]    DOB: [birth year]   Date:08/27/2015       Progress Note  Subjective  Chief Complaint  Chief Complaint  Patient presents with  . Follow-up    2 wk    HPI  Diabetes Mellitus: Pt. Presents for follow up of Diabetes, well-controlled on diet alone. Not on any pharmacotherapy. Today's A1c was 5.6%.  Hyperlipidemia: Pt. Is on Simvastatin 40 mg at bedtime. No recent Lipid Panel. No side effects reported from statin therapy.   Past Medical History  Diagnosis Date  . Chronic kidney disease   . Hyperlipidemia   . GERD (gastroesophageal reflux disease)   . Anxiety   . Depression   . Diabetes mellitus without complication ([place])   . Peripheral artery disease ([place])   . Hypertension   . Obesity   . Bronchitis     hx of   . History of frequent urinary tract infections   . Urinary incontinence   . Urinary frequency   . Left renal mass   . Cataract   . Numbness and tingling     right hand   . DVT (deep venous thrombosis) (HCC)     hx of in left leg currently has left AKA   . Dry cough     Past Surgical History  Procedure Laterality Date  . Leg amputation      above the knee / left   . Abdominal hysterectomy  1978  . Cholecystectomy    . Carpal tunnel release Bilateral   . Stent placement right leg     . Robot assisted laparoscopic nephrectomy Left 01/15/2015    Procedure: ROBOTIC ASSISTED LAPAROSCOPIC RADICAL NEPHRECTOMY;  Surgeon: Alexis Frock, MD;  Location: [facility];  Service: Urology;  Laterality: Left;    Family History  Problem Relation Age of Onset  . Cancer Mother     Stomach  . Cancer Father   . Diabetes Brother   . Cancer Brother     Social History   Social History  . Marital Status: Married    Spouse Name: N/A  . Number of Children: N/A  . Years of Education: N/A   Occupational History  . Not on file.   Social History Main Topics  . Smoking status: Former Smoker -- 1.50 packs/day for 51 years    Types:  Cigarettes    Quit date: 05/24/2004  . Smokeless tobacco: Never Used  . Alcohol Use: No  . Drug Use: No  . Sexual Activity: Not Currently   Other Topics Concern  . Not on file   Social History Narrative     Current outpatient prescriptions:  .  ALPRAZolam (XANAX) 0.5 MG tablet, Take 1 tablet (0.5 mg total) by mouth 3 (three) times daily as needed for anxiety., Disp: 90 tablet, Rfl: 0 .  citalopram (CELEXA) 20 MG tablet, TAKE 1 TABLET  AT BEDTIME., Disp: 90 tablet, Rfl: 0 .  clopidogrel (PLAVIX) 75 MG tablet, TAKE 1 TABLET ONCE DAILY, Disp: 90 tablet, Rfl: 0 .  gabapentin (NEURONTIN) 300 MG capsule, Take 1 capsule (300 mg total) by mouth 3 (three) times daily., Disp: 90 capsule, Rfl: 0 .  HYDROcodone-acetaminophen (NORCO) 7.5-325 MG tablet, Take 1 tablet by mouth 3 (three) times daily as needed., Disp: 90 tablet, Rfl: 0 .  lisinopril (PRINIVIL,ZESTRIL) 5 MG tablet, TAKE 1 TABLET DAILY., Disp: 90 tablet, Rfl: 0 .  metoprolol (LOPRESSOR) 50 MG tablet, TAKE 1 TABLET TWICE DAILY, Disp: 180 tablet,  Rfl: 0 .  pantoprazole (PROTONIX) 40 MG tablet, Take 1 tablet (40 mg total) by mouth daily., Disp: 90 tablet, Rfl: 1 .  ranitidine (ZANTAC) 150 MG capsule, Take 1 capsule (150 mg total) by mouth at bedtime., Disp: 90 capsule, Rfl: 1 .  simvastatin (ZOCOR) 40 MG tablet, TAKE 1 TABLET DAILY., Disp: 90 tablet, Rfl: 0 .  VENTOLIN HFA 108 (90 BASE) MCG/ACT inhaler, Inhale 1-2 puffs into the lungs every 4 (four) hours as needed for wheezing or shortness of breath. , Disp: , Rfl:  .  Vitamin D, Ergocalciferol, (DRISDOL) 50000 UNITS CAPS capsule, , Disp: , Rfl:   Allergies  Allergen Reactions  . Fentanyl Other (See Comments)    urine retention  . Penicillins Itching and Rash     Review of Systems  Gastrointestinal: Negative for heartburn, nausea, vomiting and abdominal pain.  Musculoskeletal: Negative for myalgias.     Objective  Filed Vitals:   08/27/15 1344  BP: 108/71  Pulse: 60  Temp:  98.8 F (37.1 C)  TempSrc: Oral  Resp: 16  Height: 5\' 2"  (1.575 m)  Weight: 160 lb (72.576 kg)  SpO2: 92%    Physical Exam  Constitutional: She is oriented to person, place, and time and well-developed, well-nourished, and in no distress.  Cardiovascular: Normal rate and regular rhythm.   Pulmonary/Chest: Effort normal and breath sounds normal.  Abdominal: Soft. Bowel sounds are normal.  Neurological: She is alert and oriented to person, place, and time.  Psychiatric: Mood, memory, affect and judgment normal.  Nursing note and vitals reviewed.      Assessment & Plan  1. HLD (hyperlipidemia) Obtain FLP and adjust medication as appropriate. - Lipid Profile - Comprehensive Metabolic Panel (CMET)  2. Controlled type 2 diabetes mellitus with stage 3 chronic kidney disease, without long-term current use of insulin (HCC) A1c is at goal, no pharmacotherapy indicated at this time. Follow up with nephrology for CK disease stage III. - POCT HgB A1C - POCT UA - Microalbumin    Asiah Browder [facility] 08/27/2015 2:04 PM

## 2015-08-28 ENCOUNTER — Ambulatory Visit: Payer: Commercial Managed Care - HMO | Admitting: Family Medicine

## 2015-08-28 LAB — COMPREHENSIVE METABOLIC PANEL
A/G RATIO: 1.7 (ref 1.2–2.2)
ALK PHOS: 76 IU/L (ref 39–117)
ALT: 8 IU/L (ref 0–32)
AST: 13 IU/L (ref 0–40)
Albumin: 3.8 g/dL (ref 3.5–4.8)
BUN/Creatinine Ratio: 12 (ref 12–28)
BUN: 21 mg/dL (ref 8–27)
Bilirubin Total: 0.6 mg/dL (ref 0.0–1.2)
CALCIUM: 9.1 mg/dL (ref 8.7–10.3)
CO2: 24 mmol/L (ref 18–29)
Chloride: 101 mmol/L (ref 96–106)
Creatinine, Ser: 1.81 mg/dL — ABNORMAL HIGH (ref 0.57–1.00)
GFR calc Af Amer: 31 mL/min/{1.73_m2} — ABNORMAL LOW (ref >59)
GFR, EST NON AFRICAN AMERICAN: 27 mL/min/{1.73_m2} — AB (ref >59)
GLOBULIN, TOTAL: 2.3 g/dL (ref 1.5–4.5)
Glucose: 82 mg/dL (ref 65–99)
POTASSIUM: 5.6 mmol/L — AB (ref 3.5–5.2)
SODIUM: 139 mmol/L (ref 134–144)
Total Protein: 6.1 g/dL (ref 6.0–8.5)

## 2015-08-28 LAB — LIPID PANEL
CHOL/HDL RATIO: 3 ratio (ref 0.0–4.4)
Cholesterol, Total: 136 mg/dL (ref 100–199)
HDL: 46 mg/dL (ref >39)
LDL CALC: 65 mg/dL (ref 0–99)
Triglycerides: 127 mg/dL (ref 0–149)
VLDL CHOLESTEROL CAL: 25 mg/dL (ref 5–40)

## 2015-08-28 LAB — MICROALBUMIN / CREATININE URINE RATIO
Creatinine, Urine: 22.5 mg/dL
MICROALB/CREAT RATIO: 69.3 mg/g{creat} — AB (ref 0.0–30.0)
MICROALBUM., U, RANDOM: 15.6 ug/mL

## 2015-09-25 ENCOUNTER — Ambulatory Visit (INDEPENDENT_AMBULATORY_CARE_PROVIDER_SITE_OTHER): Payer: Commercial Managed Care - HMO | Admitting: Family Medicine

## 2015-09-25 ENCOUNTER — Encounter: Payer: Self-pay | Admitting: Family Medicine

## 2015-09-25 VITALS — BP 110/68 | HR 61 | Temp 98.0°F | Resp 18 | Ht 62.0 in | Wt 160.0 lb

## 2015-09-25 DIAGNOSIS — G546 Phantom limb syndrome with pain: Secondary | ICD-10-CM | POA: Diagnosis not present

## 2015-09-25 DIAGNOSIS — N184 Chronic kidney disease, stage 4 (severe): Secondary | ICD-10-CM

## 2015-09-25 DIAGNOSIS — F419 Anxiety disorder, unspecified: Secondary | ICD-10-CM | POA: Diagnosis not present

## 2015-09-25 MED ORDER — ALPRAZOLAM 0.5 MG PO TABS
0.5000 mg | ORAL_TABLET | Freq: Three times a day (TID) | ORAL | Status: DC | PRN
Start: 2015-09-25 — End: 2015-10-27

## 2015-09-25 MED ORDER — HYDROCODONE-ACETAMINOPHEN 7.5-325 MG PO TABS: 1.0000 | ORAL_TABLET | Freq: Three times a day (TID) | ORAL | Status: DC | PRN

## 2015-09-25 NOTE — Progress Notes (Signed)
Name: Amy [name]   MRN: [id number]    DOB: Oct 02, 1935   Date:09/25/2015       Progress Note  Subjective  Chief Complaint  Chief Complaint  Patient presents with  . Anxiety    pt here for med refill and follow up  . Hyperlipidemia    HPI  Phantom Limb Pain: Phantom limb pain since her amputation in 2007. Pain feels the pain coming from her left leg (which has been amputated). She takes Hydrocodone-Acetaminophen 7.5-325 mg three times daily as needed and Gabapentin. She reports Hydrocodone helps relieve her pain.   Anxiety: Pt. presents for follow up of anxiety, feels worried and anxious. She takes Alprazolam 0.5 mg three times daily as needed, which helps relieve her anxiety but feels like she needs to take a higher dose to fully control her symptoms.    Past Medical History  Diagnosis Date  . Chronic kidney disease   . Hyperlipidemia   . GERD (gastroesophageal reflux disease)   . Anxiety   . Depression   . Diabetes mellitus without complication ([place])   . Peripheral artery disease ([place])   . Hypertension   . Obesity   . Bronchitis     hx of   . History of frequent urinary tract infections   . Urinary incontinence   . Urinary frequency   . Left renal mass   . Cataract   . Numbness and tingling     right hand   . DVT (deep venous thrombosis) (HCC)     hx of in left leg currently has left AKA   . Dry cough     Past Surgical History  Procedure Laterality Date  . Leg amputation      above the knee / left   . Abdominal hysterectomy  1978  . Cholecystectomy    . Carpal tunnel release Bilateral   . Stent placement right leg     . Robot assisted laparoscopic nephrectomy Left 01/15/2015    Procedure: ROBOTIC ASSISTED LAPAROSCOPIC RADICAL NEPHRECTOMY;  Surgeon: Alexis Frock, MD;  Location: [facility];  Service: Urology;  Laterality: Left;    Family History  Problem Relation Age of Onset  . Cancer Mother     Stomach  . Cancer Father   . Diabetes Brother   .  Cancer Brother     Social History   Social History  . Marital Status: Married    Spouse Name: N/A  . Number of Children: N/A  . Years of Education: N/A   Occupational History  . Not on file.   Social History Main Topics  . Smoking status: Former Smoker -- 1.50 packs/day for 51 years    Types: Cigarettes    Quit date: 05/24/2004  . Smokeless tobacco: Never Used  . Alcohol Use: No  . Drug Use: No  . Sexual Activity: Not Currently   Other Topics Concern  . Not on file   Social History Narrative     Current outpatient prescriptions:  .  ALPRAZolam (XANAX) 0.5 MG tablet, Take 1 tablet (0.5 mg total) by mouth 3 (three) times daily as needed for anxiety., Disp: 90 tablet, Rfl: 0 .  citalopram (CELEXA) 20 MG tablet, TAKE 1 TABLET  AT BEDTIME., Disp: 90 tablet, Rfl: 0 .  clopidogrel (PLAVIX) 75 MG tablet, TAKE 1 TABLET ONCE DAILY, Disp: 90 tablet, Rfl: 0 .  gabapentin (NEURONTIN) 300 MG capsule, Take 1 capsule (300 mg total) by mouth 3 (three) times daily., Disp: 90 capsule, Rfl:  0 .  HYDROcodone-acetaminophen (NORCO) 7.5-325 MG tablet, Take 1 tablet by mouth 3 (three) times daily as needed., Disp: 90 tablet, Rfl: 0 .  lisinopril (PRINIVIL,ZESTRIL) 5 MG tablet, TAKE 1 TABLET DAILY., Disp: 90 tablet, Rfl: 0 .  metoprolol (LOPRESSOR) 50 MG tablet, TAKE 1 TABLET TWICE DAILY, Disp: 180 tablet, Rfl: 0 .  pantoprazole (PROTONIX) 40 MG tablet, Take 1 tablet (40 mg total) by mouth daily., Disp: 90 tablet, Rfl: 1 .  ranitidine (ZANTAC) 150 MG capsule, Take 1 capsule (150 mg total) by mouth at bedtime., Disp: 90 capsule, Rfl: 1 .  simvastatin (ZOCOR) 40 MG tablet, TAKE 1 TABLET DAILY., Disp: 90 tablet, Rfl: 0 .  VENTOLIN HFA 108 (90 BASE) MCG/ACT inhaler, Inhale 1-2 puffs into the lungs every 4 (four) hours as needed for wheezing or shortness of breath. , Disp: , Rfl:  .  Vitamin D, Ergocalciferol, (DRISDOL) 50000 UNITS CAPS capsule, , Disp: , Rfl:   Allergies  Allergen Reactions  .  Fentanyl Other (See Comments)    urine retention  . Penicillins Itching and Rash     Review of Systems  Musculoskeletal: Positive for myalgias.  Psychiatric/Behavioral: The patient is nervous/anxious.     Objective  Filed Vitals:   09/25/15 1347  BP: 110/68  Pulse: 61  Temp: 98 F (36.7 C)  Resp: 18  Height: 5\' 2"  (1.575 m)  Weight: 160 lb (72.576 kg)  SpO2: 92%    Physical Exam  Constitutional: She is oriented to person, place, and time and well-developed, well-nourished, and in no distress.  HENT:  Head: Normocephalic and atraumatic.  Cardiovascular: Normal rate and regular rhythm.   Pulmonary/Chest: Effort normal and breath sounds normal.  Musculoskeletal:  L. Above-knee amputation.   Neurological: She is alert and oriented to person, place, and time.  Nursing note and vitals reviewed.    Assessment & Plan  1. Anxiety Stable on alprazolam taken 3 times daily as needed. Refills provided - ALPRAZolam (XANAX) 0.5 MG tablet; Take 1 tablet (0.5 mg total) by mouth 3 (three) times daily as needed for anxiety.  Dispense: 90 tablet; Refill: 0  2. Phantom limb syndrome with pain (HCC) Phantom limb pain responsive to opioid therapy along with gabapentin. Refills provided and follow-up in one month. - HYDROcodone-acetaminophen (NORCO) 7.5-325 MG tablet; Take 1 tablet by mouth 3 (three) times daily as needed.  Dispense: 90 tablet; Refill: 0  3. CKD (chronic kidney disease) stage 4, GFR 15-29 ml/min (HCC) Reviewed most recent GFR, encouraged to follow up with nephrology.    Cher Franzoni  follow up with nephrology.[facility] 09/25/2015 1:55 PM

## 2015-10-07 ENCOUNTER — Telehealth: Payer: Self-pay | Admitting: Family Medicine

## 2015-10-07 ENCOUNTER — Other Ambulatory Visit: Payer: Self-pay | Admitting: Family Medicine

## 2015-10-07 NOTE — Telephone Encounter (Signed)
Please advise patient to contact her pharmacy and have the pharmacy send a 90 day refill request for pertinent medications.

## 2015-10-07 NOTE — Telephone Encounter (Signed)
Prescriptions for the medications as requested by the pharmacy have been sent

## 2015-10-07 NOTE — Telephone Encounter (Signed)
Pt states he has already gotten them to send a request over. Pt gets her meds free if the RX is written for 90 days and not 30 days.

## 2015-10-07 NOTE — Telephone Encounter (Signed)
Pt states all of her medicines should be 90 day supply and the last time pt got refills it was only for 30. Please advise pt.

## 2015-10-10 ENCOUNTER — Telehealth: Payer: Self-pay

## 2015-10-10 MED ORDER — GABAPENTIN 300 MG PO CAPS: 300.0000 mg | ORAL_CAPSULE | Freq: Three times a day (TID) | ORAL | Status: DC

## 2015-10-10 NOTE — Telephone Encounter (Signed)
Medication has been refilled and sent to [facility]

## 2015-10-27 ENCOUNTER — Encounter: Payer: Self-pay | Admitting: Family Medicine

## 2015-10-27 ENCOUNTER — Ambulatory Visit (INDEPENDENT_AMBULATORY_CARE_PROVIDER_SITE_OTHER): Payer: Commercial Managed Care - HMO | Admitting: Family Medicine

## 2015-10-27 ENCOUNTER — Telehealth: Payer: Self-pay | Admitting: Family Medicine

## 2015-10-27 VITALS — BP 128/68 | HR 55 | Temp 98.2°F | Resp 14 | Ht 62.0 in

## 2015-10-27 DIAGNOSIS — F419 Anxiety disorder, unspecified: Secondary | ICD-10-CM | POA: Diagnosis not present

## 2015-10-27 DIAGNOSIS — E119 Type 2 diabetes mellitus without complications: Secondary | ICD-10-CM | POA: Diagnosis not present

## 2015-10-27 DIAGNOSIS — G546 Phantom limb syndrome with pain: Secondary | ICD-10-CM | POA: Diagnosis not present

## 2015-10-27 LAB — GLUCOSE, POCT (MANUAL RESULT ENTRY): POC GLUCOSE: 93 mg/dL (ref 70–99)

## 2015-10-27 LAB — POCT GLYCOSYLATED HEMOGLOBIN (HGB A1C): Hemoglobin A1C: 5.6

## 2015-10-27 MED ORDER — ALPRAZOLAM 0.5 MG PO TABS: 0.5000 mg | ORAL_TABLET | Freq: Three times a day (TID) | ORAL | Status: DC | PRN

## 2015-10-27 MED ORDER — HYDROCODONE-ACETAMINOPHEN 7.5-325 MG PO TABS: 1.0000 | ORAL_TABLET | Freq: Three times a day (TID) | ORAL | Status: DC | PRN

## 2015-10-27 NOTE — Progress Notes (Signed)
Name: Amy [name]   MRN: [id number]    DOB: June 15, 1935   Date:10/27/2015       Progress Note  Subjective  Chief Complaint  Chief Complaint  Patient presents with  . Follow-up    1 mo  . Diabetes  . Medication Refill    xanax 0.5 mg / hydrocodone 7.5-325 mg    Anxiety Presents for follow-up visit. Onset was 1 to 5 years ago (over 30 years). The problem has been unchanged. Symptoms include excessive worry, insomnia, nervous/anxious behavior and panic (occasional panic attacks.).   Past treatments include benzodiazephines. The treatment provided moderate relief. Compliance with prior treatments has been good.    Phantom Limb Pain: Patient presents for evaluation of phantom limb pain, started after she had Above-knee Amputation in 2007, has sensation of pain in the region where her leg and foot used to be. She takes Hydrocodone-Acetaminophen 7.5-325 mg upto 3 times daily as PRN. Pain is rated at 0/10 at this time, worse at night usually.   Past Medical History  Diagnosis Date  . Chronic kidney disease   . Hyperlipidemia   . GERD (gastroesophageal reflux disease)   . Anxiety   . Depression   . Diabetes mellitus without complication ([place])   . Peripheral artery disease ([place])   . Hypertension   . Obesity   . Bronchitis     hx of   . History of frequent urinary tract infections   . Urinary incontinence   . Urinary frequency   . Left renal mass   . Cataract   . Numbness and tingling     right hand   . DVT (deep venous thrombosis) (HCC)     hx of in left leg currently has left AKA   . Dry cough     Past Surgical History  Procedure Laterality Date  . Leg amputation      above the knee / left   . Abdominal hysterectomy  1978  . Cholecystectomy    . Carpal tunnel release Bilateral   . Stent placement right leg     . Robot assisted laparoscopic nephrectomy Left 01/15/2015    Procedure: ROBOTIC ASSISTED LAPAROSCOPIC RADICAL NEPHRECTOMY;  Surgeon: Alexis Frock, MD;   Location: [facility];  Service: Urology;  Laterality: Left;    Family History  Problem Relation Age of Onset  . Cancer Mother     Stomach  . Cancer Father   . Diabetes Brother   . Cancer Brother     Social History   Social History  . Marital Status: Married    Spouse Name: N/A  . Number of Children: N/A  . Years of Education: N/A   Occupational History  . Not on file.   Social History Main Topics  . Smoking status: Former Smoker -- 1.50 packs/day for 51 years    Types: Cigarettes    Quit date: 05/24/2004  . Smokeless tobacco: Never Used  . Alcohol Use: No  . Drug Use: No  . Sexual Activity: Not Currently   Other Topics Concern  . Not on file   Social History Narrative     Current outpatient prescriptions:  .  ALPRAZolam (XANAX) 0.5 MG tablet, Take 1 tablet (0.5 mg total) by mouth 3 (three) times daily as needed for anxiety., Disp: 90 tablet, Rfl: 0 .  citalopram (CELEXA) 20 MG tablet, TAKE 1 TABLET AT BEDTIME, Disp: 90 tablet, Rfl: 0 .  clopidogrel (PLAVIX) 75 MG tablet, TAKE 1 TABLET EVERY DAY, Disp:  90 tablet, Rfl: 0 .  gabapentin (NEURONTIN) 300 MG capsule, Take 1 capsule (300 mg total) by mouth 3 (three) times daily., Disp: 90 capsule, Rfl: 1 .  HYDROcodone-acetaminophen (NORCO) 7.5-325 MG tablet, Take 1 tablet by mouth 3 (three) times daily as needed., Disp: 90 tablet, Rfl: 0 .  lisinopril (PRINIVIL,ZESTRIL) 5 MG tablet, TAKE 1 TABLET EVERY DAY, Disp: 90 tablet, Rfl: 0 .  metoprolol (LOPRESSOR) 50 MG tablet, TAKE 1 TABLET TWICE DAILY, Disp: 180 tablet, Rfl: 0 .  pantoprazole (PROTONIX) 40 MG tablet, Take 1 tablet (40 mg total) by mouth daily., Disp: 90 tablet, Rfl: 1 .  ranitidine (ZANTAC) 150 MG capsule, Take 1 capsule (150 mg total) by mouth at bedtime., Disp: 90 capsule, Rfl: 1 .  simvastatin (ZOCOR) 40 MG tablet, TAKE 1 TABLET EVERY DAY, Disp: 90 tablet, Rfl: 0 .  VENTOLIN HFA 108 (90 BASE) MCG/ACT inhaler, Inhale 1-2 puffs into the lungs every 4 (four) hours as  needed for wheezing or shortness of breath. , Disp: , Rfl:  .  Vitamin D, Ergocalciferol, (DRISDOL) 50000 UNITS CAPS capsule, , Disp: , Rfl:   Allergies  Allergen Reactions  . Fentanyl Other (See Comments)    urine retention  . Penicillins Itching and Rash     Review of Systems  Psychiatric/Behavioral: The patient is nervous/anxious and has insomnia.     Objective  Filed Vitals:   10/27/15 1355  BP: 128/68  Pulse: 55  Temp: 98.2 F (36.8 C)  TempSrc: Oral  Resp: 14  Height: 5\' 2"  (1.575 m)  SpO2: 93%    Physical Exam  Constitutional: She is oriented to person, place, and time and well-developed, well-nourished, and in no distress.  HENT:  Head: Normocephalic and atraumatic.  Cardiovascular: Normal rate and regular rhythm.   Pulmonary/Chest: Effort normal and breath sounds normal.  Musculoskeletal:  Left leg s/p AKA, stump dry and clean. No tenderness/redness.  Neurological: She is alert and oriented to person, place, and time.  Psychiatric: Memory, affect and judgment normal. Her mood appears anxious. She does not exhibit a depressed mood.  Nursing note and vitals reviewed.     Assessment & Plan  1. Phantom limb syndrome with pain (HCC) Stable, responsive to opioid therapy. Refills provided - HYDROcodone-acetaminophen (NORCO) 7.5-325 MG tablet; Take 1 tablet by mouth 3 (three) times daily as needed.  Dispense: 90 tablet; Refill: 0  2. Anxiety Stable, responsive to alprazolam taken up to 3 times daily as needed. Refills provided - ALPRAZolam (XANAX) 0.5 MG tablet; Take 1 tablet (0.5 mg total) by mouth 3 (three) times daily as needed for anxiety.  Dispense: 90 tablet; Refill: 0   Jamaris Biernat [facility] 10/27/2015 2:23 PM

## 2015-10-27 NOTE — Telephone Encounter (Signed)
Patient states that Dr Manuella Ghazi told him to schedule appointment for the next 2-3 weeks for an ultra sound. They would like for you to go ahead and make the appointment

## 2015-11-24 ENCOUNTER — Encounter: Payer: Self-pay | Admitting: Family Medicine

## 2015-11-24 ENCOUNTER — Ambulatory Visit (INDEPENDENT_AMBULATORY_CARE_PROVIDER_SITE_OTHER): Payer: Commercial Managed Care - HMO | Admitting: Family Medicine

## 2015-11-24 VITALS — BP 130/70 | HR 60 | Temp 98.3°F | Resp 15 | Ht 62.0 in

## 2015-11-24 DIAGNOSIS — F419 Anxiety disorder, unspecified: Secondary | ICD-10-CM

## 2015-11-24 DIAGNOSIS — G546 Phantom limb syndrome with pain: Secondary | ICD-10-CM

## 2015-11-24 MED ORDER — HYDROCODONE-ACETAMINOPHEN 7.5-325 MG PO TABS: 1.0000 | ORAL_TABLET | Freq: Three times a day (TID) | ORAL | Status: DC | PRN

## 2015-11-24 MED ORDER — ALPRAZOLAM 0.5 MG PO TABS: 0.5000 mg | ORAL_TABLET | Freq: Three times a day (TID) | ORAL | Status: DC | PRN

## 2015-11-24 NOTE — Progress Notes (Signed)
Name: Amy [name]   MRN: [id number]    DOB: 1936/05/17   Date:11/24/2015       Progress Note  Subjective  Chief Complaint  Chief Complaint  Patient presents with  . Follow-up    1 mo     Anxiety Presents for follow-up visit. The problem has been unchanged. Symptoms include excessive worry, nervous/anxious behavior and palpitations. Patient reports no insomnia or panic.   Her past medical history is significant for anxiety/panic attacks. Past treatments include benzodiazephines. The treatment provided moderate relief. Compliance with prior treatments has been good.    Phantom Limb Pain: Phantom limb pain since her amputation in 2007. Pain feels the pain coming from her left leg (which has been amputated). Recently, she feels as if her pain has gotten worse, her husband confirms that she has been complaining a little more than usual  About the pain. She takes Hydrocodone-Acetaminophen 7.5-325 mg three times daily as needed and Gabapentin.   Past Medical History  Diagnosis Date  . Chronic kidney disease   . Hyperlipidemia   . GERD (gastroesophageal reflux disease)   . Anxiety   . Depression   . Diabetes mellitus without complication ([place])   . Peripheral artery disease ([place])   . Hypertension   . Obesity   . Bronchitis     hx of   . History of frequent urinary tract infections   . Urinary incontinence   . Urinary frequency   . Left renal mass   . Cataract   . Numbness and tingling     right hand   . DVT (deep venous thrombosis) (HCC)     hx of in left leg currently has left AKA   . Dry cough     Past Surgical History  Procedure Laterality Date  . Leg amputation      above the knee / left   . Abdominal hysterectomy  1978  . Cholecystectomy    . Carpal tunnel release Bilateral   . Stent placement right leg     . Robot assisted laparoscopic nephrectomy Left 01/15/2015    Procedure: ROBOTIC ASSISTED LAPAROSCOPIC RADICAL NEPHRECTOMY;  Surgeon: Alexis Frock, MD;   Location: [facility];  Service: Urology;  Laterality: Left;    Family History  Problem Relation Age of Onset  . Cancer Mother     Stomach  . Cancer Father   . Diabetes Brother   . Cancer Brother     Social History   Social History  . Marital Status: Married    Spouse Name: N/A  . Number of Children: N/A  . Years of Education: N/A   Occupational History  . Not on file.   Social History Main Topics  . Smoking status: Former Smoker -- 1.50 packs/day for 51 years    Types: Cigarettes    Quit date: 05/24/2004  . Smokeless tobacco: Never Used  . Alcohol Use: No  . Drug Use: No  . Sexual Activity: Not Currently   Other Topics Concern  . Not on file   Social History Narrative     Current outpatient prescriptions:  .  ALPRAZolam (XANAX) 0.5 MG tablet, Take 1 tablet (0.5 mg total) by mouth 3 (three) times daily as needed for anxiety., Disp: 90 tablet, Rfl: 0 .  citalopram (CELEXA) 20 MG tablet, TAKE 1 TABLET AT BEDTIME, Disp: 90 tablet, Rfl: 0 .  clopidogrel (PLAVIX) 75 MG tablet, TAKE 1 TABLET EVERY DAY, Disp: 90 tablet, Rfl: 0 .  gabapentin (NEURONTIN) 300  MG capsule, Take 1 capsule (300 mg total) by mouth 3 (three) times daily., Disp: 90 capsule, Rfl: 1 .  HYDROcodone-acetaminophen (NORCO) 7.5-325 MG tablet, Take 1 tablet by mouth 3 (three) times daily as needed., Disp: 90 tablet, Rfl: 0 .  lisinopril (PRINIVIL,ZESTRIL) 5 MG tablet, TAKE 1 TABLET EVERY DAY, Disp: 90 tablet, Rfl: 0 .  metoprolol (LOPRESSOR) 50 MG tablet, TAKE 1 TABLET TWICE DAILY, Disp: 180 tablet, Rfl: 0 .  pantoprazole (PROTONIX) 40 MG tablet, Take 1 tablet (40 mg total) by mouth daily., Disp: 90 tablet, Rfl: 1 .  ranitidine (ZANTAC) 150 MG capsule, Take 1 capsule (150 mg total) by mouth at bedtime., Disp: 90 capsule, Rfl: 1 .  simvastatin (ZOCOR) 40 MG tablet, TAKE 1 TABLET EVERY DAY, Disp: 90 tablet, Rfl: 0 .  VENTOLIN HFA 108 (90 BASE) MCG/ACT inhaler, Inhale 1-2 puffs into the lungs every 4 (four) hours as  needed for wheezing or shortness of breath. , Disp: , Rfl:  .  Vitamin D, Ergocalciferol, (DRISDOL) 50000 UNITS CAPS capsule, , Disp: , Rfl:   Allergies  Allergen Reactions  . Fentanyl Other (See Comments)    urine retention  . Penicillins Itching and Rash     Review of Systems  Constitutional: Negative for fever and chills.  Cardiovascular: Positive for palpitations.  Neurological: Positive for tingling.  Psychiatric/Behavioral: Positive for depression. The patient is nervous/anxious. The patient does not have insomnia.     Objective  Filed Vitals:   11/24/15 1149  BP: 130/70  Pulse: 60  Temp: 98.3 F (36.8 C)  TempSrc: Oral  Resp: 15  Height: 5\' 2"  (1.575 m)  SpO2: 93%    Physical Exam  Constitutional: She is oriented to person, place, and time and well-developed, well-nourished, and in no distress.  HENT:  Head: Normocephalic and atraumatic.  Cardiovascular: Normal rate, regular rhythm and normal heart sounds.   No murmur heard. Pulmonary/Chest: Effort normal and breath sounds normal. She has no wheezes.  Musculoskeletal:  Left above knee amputation, stump intact, no evidence of infection.  Neurological: She is alert and oriented to person, place, and time.  Psychiatric: Mood, memory, affect and judgment normal.  Nursing note and vitals reviewed.     Assessment & Plan  1. Anxiety Continue on Xanax taken up to 3 times daily as needed  - ALPRAZolam (XANAX) 0.5 MG tablet; Take 1 tablet (0.5 mg total) by mouth 3 (three) times daily as needed for anxiety.  Dispense: 90 tablet; Refill: 0  2. Phantom limb syndrome with pain (HCC) No change in dosage or frequency of opioid therapy for now, reevaluate in one month and consider increasing the dosage if pain is still poorly controlled. Patient verbalized agreement with plan. - HYDROcodone-acetaminophen (NORCO) 7.5-325 MG tablet; Take 1 tablet by mouth 3 (three) times daily as needed.  Dispense: 90 tablet; Refill:  0   Myking Sar Asad A. [facility] 11/24/2015 11:54 AM

## 2015-12-02 DIAGNOSIS — I70211 Atherosclerosis of native arteries of extremities with intermittent claudication, right leg: Secondary | ICD-10-CM | POA: Diagnosis not present

## 2015-12-02 DIAGNOSIS — I739 Peripheral vascular disease, unspecified: Secondary | ICD-10-CM | POA: Diagnosis not present

## 2015-12-02 DIAGNOSIS — I10 Essential (primary) hypertension: Secondary | ICD-10-CM | POA: Diagnosis not present

## 2015-12-02 DIAGNOSIS — E785 Hyperlipidemia, unspecified: Secondary | ICD-10-CM | POA: Diagnosis not present

## 2015-12-02 DIAGNOSIS — E119 Type 2 diabetes mellitus without complications: Secondary | ICD-10-CM | POA: Diagnosis not present

## 2015-12-11 DIAGNOSIS — I129 Hypertensive chronic kidney disease with stage 1 through stage 4 chronic kidney disease, or unspecified chronic kidney disease: Secondary | ICD-10-CM | POA: Diagnosis not present

## 2015-12-11 DIAGNOSIS — N183 Chronic kidney disease, stage 3 (moderate): Secondary | ICD-10-CM | POA: Diagnosis not present

## 2015-12-11 DIAGNOSIS — R809 Proteinuria, unspecified: Secondary | ICD-10-CM | POA: Diagnosis not present

## 2015-12-11 DIAGNOSIS — N390 Urinary tract infection, site not specified: Secondary | ICD-10-CM | POA: Diagnosis not present

## 2015-12-11 DIAGNOSIS — N2581 Secondary hyperparathyroidism of renal origin: Secondary | ICD-10-CM | POA: Diagnosis not present

## 2015-12-19 ENCOUNTER — Ambulatory Visit (INDEPENDENT_AMBULATORY_CARE_PROVIDER_SITE_OTHER): Payer: Commercial Managed Care - HMO | Admitting: Family Medicine

## 2015-12-19 ENCOUNTER — Encounter: Payer: Self-pay | Admitting: Family Medicine

## 2015-12-19 DIAGNOSIS — G546 Phantom limb syndrome with pain: Secondary | ICD-10-CM

## 2015-12-19 DIAGNOSIS — F419 Anxiety disorder, unspecified: Secondary | ICD-10-CM | POA: Diagnosis not present

## 2015-12-19 MED ORDER — ALPRAZOLAM 0.5 MG PO TABS: 0.5000 mg | ORAL_TABLET | Freq: Three times a day (TID) | ORAL | 0 refills | Status: DC | PRN

## 2015-12-19 MED ORDER — HYDROCODONE-ACETAMINOPHEN 7.5-325 MG PO TABS: 1.0000 | ORAL_TABLET | Freq: Three times a day (TID) | ORAL | 0 refills | Status: DC | PRN

## 2015-12-19 MED ORDER — GABAPENTIN 300 MG PO CAPS: 300.0000 mg | ORAL_CAPSULE | Freq: Three times a day (TID) | ORAL | 1 refills | Status: DC

## 2015-12-19 NOTE — Progress Notes (Signed)
Name: Amy [name]   MRN: [id number]    DOB: [birth year]   Date:12/19/2015       Progress Note  Subjective  Chief Complaint  Chief Complaint  Patient presents with  . Pain    medication refills  . Anxiety    Anxiety  Presents for follow-up visit. Symptoms include excessive worry and nervous/anxious behavior. Patient reports no insomnia, palpitations or panic. The quality of sleep is good.    Phantom Limb Pain:  Phantom limb pain since her amputation in 2007. Pain feels the pain coming from her left leg (which has been amputated). Recently, she feels as if her pain is about the same, She takes Hydrocodone-Acetaminophen 7.5-325 mg three times daily as needed and Gabapentin for relief.  Past Medical History:  Diagnosis Date  . Anxiety   . Bronchitis    hx of   . Cataract   . Chronic kidney disease   . Depression   . Diabetes mellitus without complication ([place])   . Dry cough   . 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 GERD (gastroesophageal reflux disease)   . History of frequent urinary tract infections   . Hyperlipidemia   . Hypertension   . Left renal mass   . Numbness and tingling    right hand   . Obesity   . Peripheral artery disease ([place])   . Urinary frequency   . Urinary incontinence     Past Surgical History:  Procedure Laterality Date  . ABDOMINAL HYSTERECTOMY  1978  . CARPAL TUNNEL RELEASE Bilateral   . CHOLECYSTECTOMY    . LEG AMPUTATION     above the knee / left   . ROBOT ASSISTED LAPAROSCOPIC NEPHRECTOMY Left 01/15/2015   Procedure: ROBOTIC ASSISTED LAPAROSCOPIC RADICAL NEPHRECTOMY;  Surgeon: Alexis Frock, MD;  Location: [facility];  Service: Urology;  Laterality: Left;  . stent placement right leg       Family History  Problem Relation Age of Onset  . Cancer Mother     Stomach  . Cancer Father   . Diabetes Brother   . Cancer Brother     Social History   Social History  . Marital status:  Married    Spouse name: N/A  . Number of children: N/A  . Years of education: N/A   Occupational History  . Not on file.   Social History Main Topics  . Smoking status: Former Smoker    Packs/day: 1.50    Years: 51.00    Types: Cigarettes    Quit date: 05/24/2004  . Smokeless tobacco: Never Used  . Alcohol use No  . Drug use: No  . Sexual activity: Not Currently   Other Topics Concern  . Not on file   Social History Narrative  . No narrative on file     Current Outpatient Prescriptions:  .  ALPRAZolam (XANAX) 0.5 MG tablet, Take 1 tablet (0.5 mg total) by mouth 3 (three) times daily as needed for anxiety., Disp: 90 tablet, Rfl: 0 .  citalopram (CELEXA) 20 MG tablet, TAKE 1 TABLET AT BEDTIME, Disp: 90 tablet, Rfl: 0 .  clopidogrel (PLAVIX) 75 MG tablet, TAKE 1 TABLET EVERY DAY, Disp: 90 tablet, Rfl: 0 .  gabapentin (NEURONTIN) 300 MG capsule, Take 1 capsule (300 mg total) by mouth 3 (three) times daily., Disp: 90 capsule, Rfl: 1 .  HYDROcodone-acetaminophen (NORCO) 7.5-325 MG tablet, Take 1 tablet by mouth 3 (  three) times daily as needed., Disp: 90 tablet, Rfl: 0 .  lisinopril (PRINIVIL,ZESTRIL) 5 MG tablet, TAKE 1 TABLET EVERY DAY, Disp: 90 tablet, Rfl: 0 .  metoprolol (LOPRESSOR) 50 MG tablet, TAKE 1 TABLET TWICE DAILY, Disp: 180 tablet, Rfl: 0 .  pantoprazole (PROTONIX) 40 MG tablet, Take 1 tablet (40 mg total) by mouth daily., Disp: 90 tablet, Rfl: 1 .  ranitidine (ZANTAC) 150 MG capsule, Take 1 capsule (150 mg total) by mouth at bedtime., Disp: 90 capsule, Rfl: 1 .  simvastatin (ZOCOR) 40 MG tablet, TAKE 1 TABLET EVERY DAY, Disp: 90 tablet, Rfl: 0 .  VENTOLIN HFA 108 (90 BASE) MCG/ACT inhaler, Inhale 1-2 puffs into the lungs every 4 (four) hours as needed for wheezing or shortness of breath. , Disp: , Rfl:  .  Vitamin D, Ergocalciferol, (DRISDOL) 50000 UNITS CAPS capsule, , Disp: , Rfl:   Allergies  Allergen Reactions  . Fentanyl Other (See Comments)    urine retention   . Penicillins Itching and Rash     Review of Systems  Constitutional: Negative for chills, fever and weight loss.  Cardiovascular: Negative for palpitations.  Musculoskeletal: Positive for myalgias.  Psychiatric/Behavioral: The patient is nervous/anxious. The patient does not have insomnia.     Objective  Vitals:   12/19/15 1122  BP: 120/80  Pulse: (!) 50  Resp: 16  Temp: 98.5 F (36.9 C)  TempSrc: Oral  SpO2: 94%    Physical Exam  Constitutional: She is oriented to person, place, and time and well-developed, well-nourished, and in no distress.  HENT:  Head: Normocephalic and atraumatic.  Cardiovascular: Normal rate, regular rhythm and normal heart sounds.   No murmur heard. Pulmonary/Chest: Effort normal and breath sounds normal. She has no wheezes.  Musculoskeletal:  Left above knee amputation  Neurological: She is alert and oriented to person, place, and time.  Psychiatric: Mood, memory, affect and judgment normal.  Nursing note and vitals reviewed.     Recent Results (from the past 2160 hour(s))  POCT HgB A1C     Status: Normal   Collection Time: 10/27/15  2:42 PM  Result Value Ref Range   Hemoglobin A1C 5.6   POCT Glucose (CBG)     Status: Normal   Collection Time: 10/27/15  2:42 PM  Result Value Ref Range   POC Glucose 93 70 - 99 mg/dl     Assessment & Plan  1. Anxiety Stable, continue to take alprazolam as needed. - ALPRAZolam (XANAX) 0.5 MG tablet; Take 1 tablet (0.5 mg total) by mouth 3 (three) times daily as needed for anxiety.  Dispense: 90 tablet; Refill: 0  2. Phantom limb syndrome with pain (HCC) Pain relieved with hydrocodone and gabapentin. Patient compliant with controlled substances agreement. Refills provided and follow-up in one month - HYDROcodone-acetaminophen (NORCO) 7.5-325 MG tablet; Take 1 tablet by mouth 3 (three) times daily as needed.  Dispense: 90 tablet; Refill: 0 - gabapentin (NEURONTIN) 300 MG capsule; Take 1 capsule (300  mg total) by mouth 3 (three) times daily.  Dispense: 270 capsule; Refill: 1   Shericka Johnstone Asad A. [facility] 12/19/2015 11:38 AM

## 2016-01-02 ENCOUNTER — Telehealth: Payer: Self-pay | Admitting: Family Medicine

## 2016-01-02 DIAGNOSIS — Z905 Acquired absence of kidney: Secondary | ICD-10-CM

## 2016-01-05 NOTE — Telephone Encounter (Signed)
Routed to Dr. Manuella Ghazi for referral approval

## 2016-01-05 NOTE — Telephone Encounter (Signed)
Referral to urology (Dr. Tammi Klippel) has been entered

## 2016-01-06 ENCOUNTER — Other Ambulatory Visit: Payer: Self-pay | Admitting: Family Medicine

## 2016-01-06 ENCOUNTER — Ambulatory Visit (INDEPENDENT_AMBULATORY_CARE_PROVIDER_SITE_OTHER): Payer: Commercial Managed Care - HMO | Admitting: Family Medicine

## 2016-01-06 ENCOUNTER — Encounter: Payer: Self-pay | Admitting: Family Medicine

## 2016-01-06 VITALS — BP 104/68 | HR 52 | Temp 97.9°F | Resp 18

## 2016-01-06 DIAGNOSIS — N3001 Acute cystitis with hematuria: Secondary | ICD-10-CM

## 2016-01-06 LAB — POCT URINALYSIS DIPSTICK
Bilirubin, UA: NEGATIVE
Glucose, UA: NEGATIVE
Ketones, UA: NEGATIVE
Nitrite, UA: NEGATIVE
PROTEIN UA: NEGATIVE
Spec Grav, UA: 1.02
UROBILINOGEN UA: NEGATIVE
pH, UA: 7.5

## 2016-01-06 MED ORDER — CIPROFLOXACIN HCL 500 MG PO TABS: 500.0000 mg | ORAL_TABLET | Freq: Two times a day (BID) | ORAL | 0 refills | Status: DC

## 2016-01-06 NOTE — Progress Notes (Signed)
Name: Amy [name]   MRN: [id number]    DOB: Apr 25, 1936   Date:01/06/2016       Progress Note  Subjective  Chief Complaint  Chief Complaint  Patient presents with  . Urinary Tract Infection    frequency and disoriented for 1 week    Urinary Tract Infection   This is a new problem. The current episode started in the past 7 days. There has been no fever. There is no history of pyelonephritis. Pertinent negatives include no chills, discharge, flank pain or hematuria. Her past medical history is significant for a single kidney.     Past Medical History:  Diagnosis Date  . Anxiety   . Bronchitis    hx of   . Cataract   . Chronic kidney disease   . Depression   . Diabetes mellitus without complication ([place])   . Dry cough   . 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 GERD (gastroesophageal reflux disease)   . History of frequent urinary tract infections   . Hyperlipidemia   . Hypertension   . Left renal mass   . Numbness and tingling    right hand   . Obesity   . Peripheral artery disease ([place])   . Urinary frequency   . Urinary incontinence     Past Surgical History:  Procedure Laterality Date  . ABDOMINAL HYSTERECTOMY  1978  . CARPAL TUNNEL RELEASE Bilateral   . CHOLECYSTECTOMY    . LEG AMPUTATION     above the knee / left   . ROBOT ASSISTED LAPAROSCOPIC NEPHRECTOMY Left 01/15/2015   Procedure: ROBOTIC ASSISTED LAPAROSCOPIC RADICAL NEPHRECTOMY;  Surgeon: Alexis Frock, MD;  Location: [facility];  Service: Urology;  Laterality: Left;  . stent placement right leg       Family History  Problem Relation Age of Onset  . Cancer Mother     Stomach  . Cancer Father   . Diabetes Brother   . Cancer Brother     Social History   Social History  . Marital status: Married    Spouse name: N/A  . Number of children: N/A  . Years of education: N/A   Occupational History  . Not on file.   Social History Main Topics  .  Smoking status: Former Smoker    Packs/day: 1.50    Years: 51.00    Types: Cigarettes    Quit date: 05/24/2004  . Smokeless tobacco: Never Used  . Alcohol use No  . Drug use: No  . Sexual activity: Not Currently   Other Topics Concern  . Not on file   Social History Narrative  . No narrative on file     Current Outpatient Prescriptions:  .  ALPRAZolam (XANAX) 0.5 MG tablet, Take 1 tablet (0.5 mg total) by mouth 3 (three) times daily as needed for anxiety., Disp: 90 tablet, Rfl: 0 .  citalopram (CELEXA) 20 MG tablet, TAKE 1 TABLET AT BEDTIME, Disp: 90 tablet, Rfl: 0 .  clopidogrel (PLAVIX) 75 MG tablet, TAKE 1 TABLET EVERY DAY, Disp: 90 tablet, Rfl: 0 .  gabapentin (NEURONTIN) 300 MG capsule, Take 1 capsule (300 mg total) by mouth 3 (three) times daily., Disp: 270 capsule, Rfl: 1 .  HYDROcodone-acetaminophen (NORCO) 7.5-325 MG tablet, Take 1 tablet by mouth 3 (three) times daily as needed., Disp: 90 tablet, Rfl: 0 .  lisinopril (PRINIVIL,ZESTRIL) 5 MG tablet, TAKE 1 TABLET EVERY DAY, Disp:  90 tablet, Rfl: 0 .  metoprolol (LOPRESSOR) 50 MG tablet, TAKE 1 TABLET TWICE DAILY, Disp: 180 tablet, Rfl: 0 .  pantoprazole (PROTONIX) 40 MG tablet, Take 1 tablet (40 mg total) by mouth daily., Disp: 90 tablet, Rfl: 1 .  ranitidine (ZANTAC) 150 MG capsule, Take 1 capsule (150 mg total) by mouth at bedtime., Disp: 90 capsule, Rfl: 1 .  simvastatin (ZOCOR) 40 MG tablet, TAKE 1 TABLET EVERY DAY, Disp: 90 tablet, Rfl: 0 .  VENTOLIN HFA 108 (90 BASE) MCG/ACT inhaler, Inhale 1-2 puffs into the lungs every 4 (four) hours as needed for wheezing or shortness of breath. , Disp: , Rfl:  .  Vitamin D, Ergocalciferol, (DRISDOL) 50000 UNITS CAPS capsule, , Disp: , Rfl:   Allergies  Allergen Reactions  . Fentanyl Other (See Comments)    urine retention  . Penicillins Itching and Rash     Review of Systems  Constitutional: Negative for chills.  Genitourinary: Negative for flank pain and hematuria.       Objective  Vitals:   01/06/16 0812  BP: 104/68  Pulse: (!) 52  Resp: 18  Temp: 97.9 F (36.6 C)  SpO2: 93%    Physical Exam  Constitutional: She is oriented to person, place, and time and well-developed, well-nourished, and in no distress. No distress.  HENT:  Head: Normocephalic and atraumatic.  Cardiovascular: Normal rate, regular rhythm and normal heart sounds.   Pulmonary/Chest: Effort normal. No respiratory distress. She has no decreased breath sounds. She has no wheezes. She has rales.  Abdominal: Soft. Bowel sounds are normal. There is no tenderness. There is no CVA tenderness.  Neurological: She is alert and oriented to person, place, and time.  Psychiatric: Mood, affect and judgment normal.  Nursing note and vitals reviewed.     Assessment & Plan  1. Acute cystitis with hematuria Urinalysis positive for blood and leukocytes, will start on ciprofloxacin obtain updated in GFR and send urine for culture. - ciprofloxacin (CIPRO) 500 MG tablet; Take 1 tablet (500 mg total) by mouth 2 (two) times daily.  Dispense: 14 tablet; Refill: 0 - Urine Culture - BASIC METABOLIC PANEL WITH GFR - Urinalysis, Routine w reflex microscopic   Jaishon Krisher Asad A. [facility] 01/06/2016 8:17 AM

## 2016-01-07 ENCOUNTER — Ambulatory Visit: Payer: Commercial Managed Care - HMO | Admitting: Family Medicine

## 2016-01-07 LAB — BASIC METABOLIC PANEL
BUN/Creatinine Ratio: 14 (ref 12–28)
BUN: 23 mg/dL (ref 8–27)
CHLORIDE: 102 mmol/L (ref 96–106)
CO2: 20 mmol/L (ref 18–29)
Calcium: 9 mg/dL (ref 8.7–10.3)
Creatinine, Ser: 1.7 mg/dL — ABNORMAL HIGH (ref 0.57–1.00)
GFR calc Af Amer: 33 mL/min/{1.73_m2} — ABNORMAL LOW (ref >59)
GFR calc non Af Amer: 28 mL/min/{1.73_m2} — ABNORMAL LOW (ref >59)
GLUCOSE: 90 mg/dL (ref 65–99)
POTASSIUM: 5.3 mmol/L — AB (ref 3.5–5.2)
SODIUM: 138 mmol/L (ref 134–144)

## 2016-01-07 LAB — URINALYSIS, ROUTINE W REFLEX MICROSCOPIC
BILIRUBIN URINE: NEGATIVE
Glucose, UA: NEGATIVE
KETONES UR: NEGATIVE
Nitrite: NEGATIVE
PH: 6.5 (ref 5.0–8.0)
Protein, ur: NEGATIVE
SPECIFIC GRAVITY, URINE: 1.007 (ref 1.001–1.035)

## 2016-01-07 LAB — URINALYSIS, MICROSCOPIC ONLY
CASTS: NONE SEEN [LPF]
CRYSTALS: NONE SEEN [HPF]
YEAST: NONE SEEN [HPF]

## 2016-01-08 LAB — URINE CULTURE: Colony Count: >=100000

## 2016-01-09 ENCOUNTER — Telehealth: Payer: Self-pay | Admitting: Family Medicine

## 2016-01-14 ENCOUNTER — Other Ambulatory Visit: Payer: Self-pay | Admitting: Family Medicine

## 2016-01-14 DIAGNOSIS — N3001 Acute cystitis with hematuria: Secondary | ICD-10-CM

## 2016-01-16 NOTE — Telephone Encounter (Signed)
Spoke with patient's husband who reports that patient has not had any symptoms of burning on urination, blood in urine or other symptoms pertinent to a UTI. She has finished a seven-day course of ciprofloxacin. She may return to repeat urinalysis and culture or they can watch and wait to see if she presents with any new symptoms.

## 2016-01-20 ENCOUNTER — Ambulatory Visit: Payer: Commercial Managed Care - HMO | Admitting: Family Medicine

## 2016-01-21 ENCOUNTER — Ambulatory Visit (INDEPENDENT_AMBULATORY_CARE_PROVIDER_SITE_OTHER): Payer: Commercial Managed Care - HMO | Admitting: Family Medicine

## 2016-01-21 ENCOUNTER — Encounter: Payer: Self-pay | Admitting: Family Medicine

## 2016-01-21 ENCOUNTER — Other Ambulatory Visit: Payer: Self-pay | Admitting: Family Medicine

## 2016-01-21 VITALS — BP 110/71 | HR 60 | Temp 98.1°F | Resp 15 | Ht 62.0 in

## 2016-01-21 DIAGNOSIS — F419 Anxiety disorder, unspecified: Secondary | ICD-10-CM | POA: Diagnosis not present

## 2016-01-21 DIAGNOSIS — N3001 Acute cystitis with hematuria: Secondary | ICD-10-CM | POA: Diagnosis not present

## 2016-01-21 DIAGNOSIS — G546 Phantom limb syndrome with pain: Secondary | ICD-10-CM | POA: Diagnosis not present

## 2016-01-21 LAB — POCT URINALYSIS DIPSTICK
Bilirubin, UA: NEGATIVE
Blood, UA: NEGATIVE
Glucose, UA: NEGATIVE
Ketones, UA: NEGATIVE
LEUKOCYTES UA: NEGATIVE
NITRITE UA: NEGATIVE
PH UA: 5
PROTEIN UA: NEGATIVE
Spec Grav, UA: 1.01
Urobilinogen, UA: 0.2

## 2016-01-21 MED ORDER — ALPRAZOLAM 0.5 MG PO TABS: 0.5000 mg | ORAL_TABLET | Freq: Three times a day (TID) | ORAL | 0 refills | Status: DC | PRN

## 2016-01-21 MED ORDER — HYDROCODONE-ACETAMINOPHEN 7.5-325 MG PO TABS: 1.0000 | ORAL_TABLET | Freq: Three times a day (TID) | ORAL | 0 refills | Status: DC | PRN

## 2016-01-21 NOTE — Progress Notes (Signed)
Name: Amy [name]   MRN: [id number]    DOB: 10/07/1935   Date:01/21/2016       Progress Note  Subjective  Chief Complaint  Chief Complaint  Patient presents with  . Follow-up    Blood in urine     HPI  Pt. presents to repeat Urine culture, she was diagnosed  With cystitis, culture grew Klebsiella sensitive to Ciprofloxacin. SHe was started on Ciprofloxacin for 7 daysThis resulted in resolution of her symptoms, today's point-of-care urinalysis is unremarkable.  Anxiety  Presents for follow-up visit. Symptoms include excessive worry and nervous/anxious behavior. Patient reports no insomnia, palpitations or panic. The quality of sleep is good.   Phantom Limb Pain:  Phantom limb pain since her amputation in 2007. Pain feels the pain coming from her left leg (which has been amputated). Recently, she feels as if her pain is about the same, She takes Hydrocodone-Acetaminophen 7.5-325 mg three times daily as needed and Gabapentin for relief.  Past Medical History:  Diagnosis Date  . Anxiety   . Bronchitis    hx of   . Cataract   . Chronic kidney disease   . Depression   . Diabetes mellitus without complication ([place])   . Dry cough   . 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 GERD (gastroesophageal reflux disease)   . History of frequent urinary tract infections   . Hyperlipidemia   . Hypertension   . Left renal mass   . Numbness and tingling    right hand   . Obesity   . Peripheral artery disease ([place])   . Urinary frequency   . Urinary incontinence     Past Surgical History:  Procedure Laterality Date  . ABDOMINAL HYSTERECTOMY  1978  . CARPAL TUNNEL RELEASE Bilateral   . CHOLECYSTECTOMY    . LEG AMPUTATION     above the knee / left   . ROBOT ASSISTED LAPAROSCOPIC NEPHRECTOMY Left 01/15/2015   Procedure: ROBOTIC ASSISTED LAPAROSCOPIC RADICAL NEPHRECTOMY;  Surgeon: Alexis Frock, MD;  Location: [facility];  Service: Urology;   Laterality: Left;  . stent placement right leg       Family History  Problem Relation Age of Onset  . Cancer Mother     Stomach  . Cancer Father   . Diabetes Brother   . Cancer Brother     Social History   Social History  . Marital status: Married    Spouse name: N/A  . Number of children: N/A  . Years of education: N/A   Occupational History  . Not on file.   Social History Main Topics  . Smoking status: Former Smoker    Packs/day: 1.50    Years: 51.00    Types: Cigarettes    Quit date: 05/24/2004  . Smokeless tobacco: Never Used  . Alcohol use No  . Drug use: No  . Sexual activity: Not Currently   Other Topics Concern  . Not on file   Social History Narrative  . No narrative on file     Current Outpatient Prescriptions:  .  ALPRAZolam (XANAX) 0.5 MG tablet, Take 1 tablet (0.5 mg total) by mouth 3 (three) times daily as needed for anxiety., Disp: 90 tablet, Rfl: 0 .  citalopram (CELEXA) 20 MG tablet, TAKE 1 TABLET AT BEDTIME, Disp: 90 tablet, Rfl: 0 .  clopidogrel (PLAVIX) 75 MG tablet, TAKE 1 TABLET EVERY DAY, Disp: 90 tablet, Rfl:  0 .  gabapentin (NEURONTIN) 300 MG capsule, Take 1 capsule (300 mg total) by mouth 3 (three) times daily., Disp: 270 capsule, Rfl: 1 .  HYDROcodone-acetaminophen (NORCO) 7.5-325 MG tablet, Take 1 tablet by mouth 3 (three) times daily as needed., Disp: 90 tablet, Rfl: 0 .  lisinopril (PRINIVIL,ZESTRIL) 5 MG tablet, TAKE 1 TABLET EVERY DAY, Disp: 90 tablet, Rfl: 0 .  metoprolol (LOPRESSOR) 50 MG tablet, TAKE 1 TABLET TWICE DAILY, Disp: 180 tablet, Rfl: 0 .  pantoprazole (PROTONIX) 40 MG tablet, Take 1 tablet (40 mg total) by mouth daily., Disp: 90 tablet, Rfl: 1 .  ranitidine (ZANTAC) 150 MG capsule, Take 1 capsule (150 mg total) by mouth at bedtime., Disp: 90 capsule, Rfl: 1 .  simvastatin (ZOCOR) 40 MG tablet, TAKE 1 TABLET EVERY DAY, Disp: 90 tablet, Rfl: 0 .  VENTOLIN HFA 108 (90 BASE) MCG/ACT inhaler, Inhale 1-2 puffs into the lungs  every 4 (four) hours as needed for wheezing or shortness of breath. , Disp: , Rfl:  .  Vitamin D, Ergocalciferol, (DRISDOL) 50000 UNITS CAPS capsule, , Disp: , Rfl:  .  ciprofloxacin (CIPRO) 500 MG tablet, Take 1 tablet (500 mg total) by mouth 2 (two) times daily. (Patient not taking: Reported on 01/21/2016), Disp: 14 tablet, Rfl: 0  Allergies  Allergen Reactions  . Fentanyl Other (See Comments)    urine retention  . Penicillins Itching and Rash     Review of Systems  Constitutional: Negative for chills and fever.  Gastrointestinal: Negative for abdominal pain, nausea and vomiting.  Genitourinary: Negative for hematuria and urgency.  Musculoskeletal: Positive for myalgias.  Psychiatric/Behavioral: The patient is nervous/anxious.     Objective  Vitals:   01/21/16 1147  BP: 110/71  Pulse: 60  Resp: 15  Temp: 98.1 F (36.7 C)  TempSrc: Oral  SpO2: 96%  Height: 5\' 2"  (1.575 m)    Physical Exam  Constitutional: She is oriented to person, place, and time and well-developed, well-nourished, and in no distress.  HENT:  Head: Normocephalic and atraumatic.  Cardiovascular: Normal rate, regular rhythm and normal heart sounds.   No murmur heard. Pulmonary/Chest: Effort normal and breath sounds normal. She has no wheezes.  Abdominal: Soft. Bowel sounds are normal. There is no tenderness.  Musculoskeletal:  Left above knee amputation  Neurological: She is alert and oriented to person, place, and time.  Psychiatric: Mood, memory, affect and judgment normal.  Nursing note and vitals reviewed.     Assessment & Plan  1. Acute cystitis with hematuria Repeat urine analysis and culture, completed 7 day course of ciprofloxacin - POCT Urinalysis Dipstick - Urinalysis, Routine w reflex microscopic - Urine Culture  2. Anxiety Stable, responsive to alprazolam. Patient compliant with controlled substances agreement and understands the dependence potential, side effects, and drug  interactions of benzodiazepines with opioids. - ALPRAZolam (XANAX) 0.5 MG tablet; Take 1 tablet (0.5 mg total) by mouth 3 (three) times daily as needed for anxiety.  Dispense: 90 tablet; Refill: 0  3. Phantom limb syndrome with pain (HCC) Responsive to Hydrocodone 7.5 mg-325 mg taken 3 times daily as needed. Patient compliant with controlled substances agreement. Refills provided - HYDROcodone-acetaminophen (NORCO) 7.5-325 MG tablet; Take 1 tablet by mouth 3 (three) times daily as needed.  Dispense: 90 tablet; Refill: 0   Sharae Zappulla Asad A. [facility] 01/21/2016 12:00 PM

## 2016-01-22 LAB — URINALYSIS, ROUTINE W REFLEX MICROSCOPIC
BILIRUBIN URINE: NEGATIVE
GLUCOSE, UA: NEGATIVE
HGB URINE DIPSTICK: NEGATIVE
KETONES UR: NEGATIVE
Nitrite: NEGATIVE
PH: 6 (ref 5.0–8.0)
PROTEIN: NEGATIVE
Specific Gravity, Urine: 1.006 (ref 1.001–1.035)

## 2016-01-22 LAB — URINALYSIS, MICROSCOPIC ONLY
Bacteria, UA: NONE SEEN [HPF]
CRYSTALS: NONE SEEN [HPF]
Casts: NONE SEEN [LPF]
RBC / HPF: NONE SEEN RBC/HPF (ref <=2)
Yeast: NONE SEEN [HPF]

## 2016-01-23 LAB — URINE CULTURE

## 2016-02-02 ENCOUNTER — Other Ambulatory Visit: Payer: Self-pay | Admitting: Family Medicine

## 2016-02-02 DIAGNOSIS — K219 Gastro-esophageal reflux disease without esophagitis: Secondary | ICD-10-CM

## 2016-02-03 ENCOUNTER — Ambulatory Visit (HOSPITAL_COMMUNITY)
Admission: RE | Admit: 2016-02-03 | Discharge: 2016-02-03 | Disposition: A | Discharge status: Home / Self Care | Payer: Commercial Managed Care - HMO | Source: Ambulatory Visit | Attending: Urology | Admitting: Urology

## 2016-02-03 ENCOUNTER — Other Ambulatory Visit: Payer: Self-pay | Admitting: Urology

## 2016-02-03 DIAGNOSIS — I700 Atherosclerosis of aorta: Secondary | ICD-10-CM | POA: Insufficient documentation

## 2016-02-03 DIAGNOSIS — C772 Secondary and unspecified malignant neoplasm of intra-abdominal lymph nodes: Secondary | ICD-10-CM | POA: Insufficient documentation

## 2016-02-03 DIAGNOSIS — C642 Malignant neoplasm of left kidney, except renal pelvis: Secondary | ICD-10-CM | POA: Diagnosis not present

## 2016-02-03 DIAGNOSIS — M8580 Other specified disorders of bone density and structure, unspecified site: Secondary | ICD-10-CM | POA: Diagnosis not present

## 2016-02-03 DIAGNOSIS — M488X4 Other specified spondylopathies, thoracic region: Secondary | ICD-10-CM | POA: Insufficient documentation

## 2016-02-03 DIAGNOSIS — C641 Malignant neoplasm of right kidney, except renal pelvis: Secondary | ICD-10-CM | POA: Diagnosis not present

## 2016-02-10 DIAGNOSIS — C642 Malignant neoplasm of left kidney, except renal pelvis: Secondary | ICD-10-CM | POA: Diagnosis not present

## 2016-02-10 DIAGNOSIS — N184 Chronic kidney disease, stage 4 (severe): Secondary | ICD-10-CM | POA: Diagnosis not present

## 2016-02-10 DIAGNOSIS — C969 Malignant neoplasm of lymphoid, hematopoietic and related tissue, unspecified: Secondary | ICD-10-CM | POA: Diagnosis not present

## 2016-02-10 DIAGNOSIS — Z905 Acquired absence of kidney: Secondary | ICD-10-CM | POA: Diagnosis not present

## 2016-02-11 ENCOUNTER — Ambulatory Visit: Payer: Commercial Managed Care - HMO | Admitting: Urology

## 2016-02-12 ENCOUNTER — Telehealth: Payer: Self-pay | Admitting: Family Medicine

## 2016-02-12 NOTE — Telephone Encounter (Signed)
Pt husband said that the patients wheelchair broke. He only has one handle to wheel with and their is no parts for this chair for it is old and has been d/c. [place] has told the husband to get the dr to send in a RX for a wheel chair 18X16. Husband said that they need the one that the legs just twist around. Fax # is [phone]. Phone # is [phone]

## 2016-02-13 ENCOUNTER — Ambulatory Visit: Payer: Commercial Managed Care - HMO | Admitting: Urology

## 2016-02-13 NOTE — Telephone Encounter (Signed)
Prescription for wheelchair is ready for pickup

## 2016-02-13 NOTE — Telephone Encounter (Signed)
Routed to Dr. Manuella Ghazi: Pt husband said that the patients wheelchair broke. He only has one handle to wheel with and their is no parts for this chair for it is old and has been d/c. [place] has told the husband to get the dr to send in a RX for a wheel chair 18X16. Husband said that they need the one that the legs just twist around. Fax # is [phone]. Phone # is [phone]

## 2016-02-16 NOTE — Telephone Encounter (Signed)
Patient has been notified that wheelchair prescription is ready for pick up per Dr. Manuella Ghazi

## 2016-02-23 ENCOUNTER — Ambulatory Visit (INDEPENDENT_AMBULATORY_CARE_PROVIDER_SITE_OTHER): Payer: Commercial Managed Care - HMO | Admitting: Family Medicine

## 2016-02-23 ENCOUNTER — Encounter: Payer: Self-pay | Admitting: Family Medicine

## 2016-02-23 VITALS — BP 118/70 | HR 54 | Temp 98.9°F | Resp 16

## 2016-02-23 DIAGNOSIS — Z23 Encounter for immunization: Secondary | ICD-10-CM

## 2016-02-23 DIAGNOSIS — G546 Phantom limb syndrome with pain: Secondary | ICD-10-CM | POA: Diagnosis not present

## 2016-02-23 DIAGNOSIS — M545 Low back pain, unspecified: Secondary | ICD-10-CM

## 2016-02-23 DIAGNOSIS — G8929 Other chronic pain: Secondary | ICD-10-CM | POA: Diagnosis not present

## 2016-02-23 DIAGNOSIS — F419 Anxiety disorder, unspecified: Secondary | ICD-10-CM

## 2016-02-23 MED ORDER — ALPRAZOLAM 0.5 MG PO TABS: 0.5000 mg | ORAL_TABLET | Freq: Three times a day (TID) | ORAL | 0 refills | Status: DC | PRN

## 2016-02-23 MED ORDER — HYDROCODONE-ACETAMINOPHEN 7.5-325 MG PO TABS: 1.0000 | ORAL_TABLET | Freq: Three times a day (TID) | ORAL | 0 refills | Status: DC | PRN

## 2016-02-23 NOTE — Progress Notes (Signed)
Name: Amy [name]   MRN: [id number]    DOB: 1935-10-08   Date:02/23/2016       Progress Note  Subjective  Chief Complaint  Chief Complaint  Patient presents with  . Follow-up    medication refills    Anxiety  Presents for follow-up visit. The problem has been unchanged. Symptoms include excessive worry and nervous/anxious behavior. Patient reports no insomnia, palpitations, panic or restlessness. Symptoms occur most days. The severity of symptoms is moderate and causing significant distress. The quality of sleep is fair.   Her past medical history is significant for anxiety/panic attacks. Past treatments include benzodiazephines. The treatment provided moderate relief. Compliance with prior treatments has been good.  Back Pain  This is a chronic problem. The pain is present in the lumbar spine. The pain does not radiate. The pain is moderate. Exacerbated by: Pt. sits in a wheelchair or a recliner most of the day, does not really walk or bend over enough to determine exacerbating or alleviating factors. Associated symptoms include leg pain (left phantom leg pain.). Pertinent negatives include no dysuria. She has tried analgesics for the symptoms.   Phantom Limb Pain:  Phantom limb pain since her amputation in 2007. Pain feels the pain coming from her left leg (which has been amputated). Recently, she feels as if her pain is about the same, pain rated at 2/10. She takes Hydrocodone-Acetaminophen 7.5-325 mg three times daily as needed and Gabapentin for relief.   Past Medical History:  Diagnosis Date  . Anxiety   . Bronchitis    hx of   . Cataract   . Chronic kidney disease   . Depression   . Diabetes mellitus without complication ([place])   . Dry cough   . 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 GERD (gastroesophageal reflux disease)   . History of frequent urinary tract infections   . Hyperlipidemia   . Hypertension   . Left renal  mass   . Numbness and tingling    right hand   . Obesity   . Peripheral artery disease ([place])   . Urinary frequency   . Urinary incontinence     Past Surgical History:  Procedure Laterality Date  . ABDOMINAL HYSTERECTOMY  1978  . CARPAL TUNNEL RELEASE Bilateral   . CHOLECYSTECTOMY    . LEG AMPUTATION     above the knee / left   . ROBOT ASSISTED LAPAROSCOPIC NEPHRECTOMY Left 01/15/2015   Procedure: ROBOTIC ASSISTED LAPAROSCOPIC RADICAL NEPHRECTOMY;  Surgeon: Alexis Frock, MD;  Location: [facility];  Service: Urology;  Laterality: Left;  . stent placement right leg       Family History  Problem Relation Age of Onset  . Cancer Mother     Stomach  . Cancer Father   . Diabetes Brother   . Cancer Brother     Social History   Social History  . Marital status: Married    Spouse name: N/A  . Number of children: N/A  . Years of education: N/A   Occupational History  . Not on file.   Social History Main Topics  . Smoking status: Former Smoker    Packs/day: 1.50    Years: 51.00    Types: Cigarettes    Quit date: 05/24/2004  . Smokeless tobacco: Never Used  . Alcohol use No  . Drug use: No  . Sexual activity: Not Currently   Other Topics Concern  .  Not on file   Social History Narrative  . No narrative on file     Current Outpatient Prescriptions:  .  ALPRAZolam (XANAX) 0.5 MG tablet, Take 1 tablet (0.5 mg total) by mouth 3 (three) times daily as needed for anxiety., Disp: 90 tablet, Rfl: 0 .  citalopram (CELEXA) 20 MG tablet, TAKE 1 TABLET AT BEDTIME, Disp: 90 tablet, Rfl: 0 .  clopidogrel (PLAVIX) 75 MG tablet, TAKE 1 TABLET EVERY DAY, Disp: 90 tablet, Rfl: 0 .  gabapentin (NEURONTIN) 300 MG capsule, Take 1 capsule (300 mg total) by mouth 3 (three) times daily., Disp: 270 capsule, Rfl: 1 .  HYDROcodone-acetaminophen (NORCO) 7.5-325 MG tablet, Take 1 tablet by mouth 3 (three) times daily as needed., Disp: 90 tablet, Rfl: 0 .  lisinopril (PRINIVIL,ZESTRIL) 5 MG tablet,  TAKE 1 TABLET EVERY DAY, Disp: 90 tablet, Rfl: 0 .  metoprolol (LOPRESSOR) 50 MG tablet, TAKE 1 TABLET TWICE DAILY, Disp: 180 tablet, Rfl: 0 .  pantoprazole (PROTONIX) 40 MG tablet, TAKE 1 TABLET (40 MG TOTAL) BY MOUTH DAILY., Disp: 90 tablet, Rfl: 1 .  ranitidine (ZANTAC) 150 MG capsule, TAKE 1 CAPSULE (150 MG TOTAL) BY MOUTH AT BEDTIME., Disp: 90 capsule, Rfl: 1 .  simvastatin (ZOCOR) 40 MG tablet, TAKE 1 TABLET EVERY DAY, Disp: 90 tablet, Rfl: 0 .  VENTOLIN HFA 108 (90 BASE) MCG/ACT inhaler, Inhale 1-2 puffs into the lungs every 4 (four) hours as needed for wheezing or shortness of breath. , Disp: , Rfl:  .  Vitamin D, Ergocalciferol, (DRISDOL) 50000 UNITS CAPS capsule, , Disp: , Rfl:   Allergies  Allergen Reactions  . Fentanyl Other (See Comments)    urine retention  . Penicillins Itching and Rash     Review of Systems  Cardiovascular: Negative for palpitations.  Genitourinary: Negative for dysuria and urgency.  Musculoskeletal: Positive for back pain.  Psychiatric/Behavioral: Positive for depression. The patient is nervous/anxious. The patient does not have insomnia.     Objective  Vitals:   02/23/16 1026  BP: 118/70  Pulse: (!) 54  Resp: 16  Temp: 98.9 F (37.2 C)  TempSrc: Oral  SpO2: 94%    Physical Exam  Constitutional: She is oriented to person, place, and time and well-developed, well-nourished, and in no distress.  HENT:  Head: Normocephalic and atraumatic.  Cardiovascular: Normal rate, regular rhythm and normal heart sounds.   No murmur heard. Pulmonary/Chest: Effort normal and breath sounds normal.  Abdominal: Soft. Bowel sounds are normal. There is no tenderness.  Musculoskeletal:       Lumbar back: She exhibits no tenderness, no pain and no spasm.  Neurological: She is alert and oriented to person, place, and time.  Psychiatric: Mood, memory, affect and judgment normal.  Nursing note and vitals reviewed.     Assessment & Plan  1. Anxiety Stable,  continue on alprazolam as prescribed - ALPRAZolam (XANAX) 0.5 MG tablet; Take 1 tablet (0.5 mg total) by mouth 3 (three) times daily as needed for anxiety.  Dispense: 90 tablet; Refill: 0  2. Phantom limb syndrome with pain (HCC)  - HYDROcodone-acetaminophen (NORCO) 7.5-325 MG tablet; Take 1 tablet by mouth 3 (three) times daily as needed.  Dispense: 90 tablet; Refill: 0  3. Chronic midline low back pain without sciatica Likely from deconditioning, patient spends most of her time on a wheelchair or on the recliner. May consider Physical Therapy.   4. Need for influenza vaccination  - Flu vaccine HIGH DOSE PF (Fluzone High dose)  Irvan Tiedt [facility] 02/23/2016 10:38 AM

## 2016-02-27 ENCOUNTER — Other Ambulatory Visit: Payer: Self-pay

## 2016-02-27 NOTE — Patient Outreach (Signed)
[facility]) Care Management  02/27/2016  Amy [name] [birth year] [id number]   Telephone Screen  Referral Date: 02/26/16 Referral Source: EMMI Prevent Call Referral Reason: "patient lost left leg in 2007, put on a lot of weight, getting harder for spouse to handle her,falls while getting back into her wheelchair"    Outreach attempt # 1 to patient. Spoke with patient who gave verbal okay to speak with spouse-[name]. Screening completed.  Social: Patient lives in her home along with her spouse who is main caregiver. Spouse reports he has no other support to help him with managing patient's care. Patient requires assistance with ADLs/IADLs. He reports that it is becoming increasingly more difficult to manage patient's care. He reports that due to patient's weight it's getting harder for him to handle her. He reports that patient has had at least eight falls within the last three months. Most of these have occurred while trying to transfer patient. Patient is wheelchair bound. He reports wheelchair needs replacing and he has contacted MD office to obtain script for a new one. Other DME in the home only consists of shower chair.   Conditions: Patient has PMH of anxiety, depression, panic attacks, chronic back pain, left AKA,CKD,GERD, HTN,HLD,DM, left renal mass s/p removal, PAD and obesity.  Spouse reports that patient also suffers from dementia and that MD was "supposed to be doing further testing." He states that sometimes patient does not recognize who he is and thinks he's a stranger. Spouse is monitoring patient's BP in the home but admits that he does not check it consistently and unable to recall any readings. He states that he checks her blood sugars about once a week. He voiced that patient was no longer on oral diabetic meds as her blood sugars were controlled. Per records last A1C was 5.6(June 2017).  Medications; Spouse reported patient is taking 10 meds. He  denies any issues with affording meds at present. He reports that he fills med planner weekly.   Advance Directive: Patient does not have and spouse declined further info at this time.  Appointments: Patient saw PCP last on 02/23/16.  Consent: [facility] services reviewed and discussed. Spouse gave verbal consent for services.   Plan: RN CM will notify [facility] administrative assistant of case status. RN CM will send [facility] community referral for further in home eval and assessment of care needs. RN CM provided spouse with [facility] 24 hr Nurse Line contact info for future reference.  Enzo Montgomery, RN,BSN,CCM [place] [facility] Telephonic Care Management Coordinator Direct Phone: [phone] Toll Free: [phone] Fax: [phone]

## 2016-03-03 ENCOUNTER — Other Ambulatory Visit: Payer: Self-pay | Admitting: *Deleted

## 2016-03-03 NOTE — Patient Outreach (Signed)
Phone call successful -follow up on referral received from [facility] telephonic RN [place] 10/6.    Spoke with spouse [name], HIPAA verified on pt, discussed purpose of call - follow up  on referral.    As discussed with spouse, plan to do a home visit next week.       Zara Chess.   [place] [facility]  [phone]

## 2016-03-09 ENCOUNTER — Encounter: Payer: Self-pay | Admitting: *Deleted

## 2016-03-09 ENCOUNTER — Other Ambulatory Visit: Payer: Self-pay | Admitting: *Deleted

## 2016-03-09 NOTE — Patient Outreach (Addendum)
[facility]) Care Management   03/09/2016  Amy [name] 05-13-1936 [id number]  Amy [name] is an 80 y.o. female  Subjective:  Spouse reports pt had 2 falls last week transferring from chair to wheelchair, landing On the floor, getting difficult to pick her up.    Spouse reports pt is up during the night by herself,  Drinks zero Pepsi, sleeping during the day.   Spouse reports pt is 24 hour care, leaving her for only  Short periods.  Spouse reports pt's sister did offer to help with pt in the past.  Spouse reports Dr. Manuella Ghazi talked about testing pt for Dementia but no test was done yet.  Pt reports she gets confused At times- thinks her Mom (deceased) is here.   Objective:  Vitals:   03/09/16 1027 03/09/16 1147  BP: 124/60 128/60  Pulse: (!) 52 (!) 53  Resp:  16     ROS  Physical Exam  Constitutional: She is oriented to person, place, and time. She appears well-developed and well-nourished.  Cardiovascular: Regular rhythm.   HR on the low side 52-53.   Respiratory: Effort normal and breath sounds normal.  GI: Soft. Bowel sounds are normal.  Musculoskeletal: She exhibits edema.  Trace edema in right lower leg.   Neurological: She is alert and oriented to person, place, and time.  Skin: Skin is warm and dry.  Psychiatric: She has a normal mood and affect. Her behavior is normal. Judgment and thought content normal.    Encounter Medications:   Outpatient Encounter Prescriptions as of 03/09/2016  Medication Sig Note  . ALPRAZolam (XANAX) 0.5 MG tablet Take 1 tablet (0.5 mg total) by mouth 3 (three) times daily as needed for anxiety. 03/09/2016: Per spouse, pt takes one tablet in am, 2 tablets before bedtime.   . citalopram (CELEXA) 20 MG tablet TAKE 1 TABLET AT BEDTIME   . clopidogrel (PLAVIX) 75 MG tablet TAKE 1 TABLET EVERY DAY   . gabapentin (NEURONTIN) 300 MG capsule Take 1 capsule (300 mg total) by mouth 3 (three) times daily.   [name]  HYDROcodone-acetaminophen (NORCO) 7.5-325 MG tablet Take 1 tablet by mouth 3 (three) times daily as needed. 03/09/2016: As needed.   . metoprolol (LOPRESSOR) 50 MG tablet TAKE 1 TABLET TWICE DAILY   . Multiple Vitamins-Minerals (HAIR SKIN AND NAILS FORMULA) TABS Take by mouth. One tablet daily   . pantoprazole (PROTONIX) 40 MG tablet TAKE 1 TABLET (40 MG TOTAL) BY MOUTH DAILY.   . ranitidine (ZANTAC) 150 MG capsule TAKE 1 CAPSULE (150 MG TOTAL) BY MOUTH AT BEDTIME.   . simvastatin (ZOCOR) 40 MG tablet TAKE 1 TABLET EVERY DAY   . acetaminophen (TYLENOL) 500 MG tablet Take 500 mg by mouth every 6 (six) hours as needed. Pt takes 2 tablets as needed. 03/09/2016: Only as needed.   . VENTOLIN HFA 108 (90 BASE) MCG/ACT inhaler Inhale 1-2 puffs into the lungs every 4 (four) hours as needed for wheezing or shortness of breath.    . Vitamin D, Ergocalciferol, (DRISDOL) 50000 UNITS CAPS capsule  03/13/2015: Received from: External Pharmacy   No facility-administered encounter medications on file as of 03/09/2016.     Functional Status:   In your present state of health, do you have any difficulty performing the following activities: 03/09/2016 03/09/2016  Hearing? [place]? N -  Difficulty concentrating or making decisions? [name]  Walking or climbing stairs? - Y  Dressing or bathing? - Y  Doing  errands, shopping? - Y  Preparing Food and eating ? Y -  Using the Toilet? Y -  In the past six months, have you accidently leaked urine? Y -  Do you have problems with loss of bowel control? Y -  Managing your Medications? Y -  Managing your Finances? Y -  Housekeeping or managing your Housekeeping? Y -  Some recent data might be hidden    Fall/Depression Screening:    PHQ 2/9 Scores 03/09/2016 02/27/2016 01/21/2016 11/24/2015 10/27/2015 09/25/2015 08/27/2015  PHQ - 2 Score 2 - 0 0 0 0 0  PHQ- 9 Score 17 - - - - - -  Exception Documentation - Other- indicate reason in comment box - - - - -  Not completed -  spoke with spouse - - - - -    Assessment:  Pleasant 80 year old woman, resides with spouse -caregiver, present during home  Visit.   Lungs clear, trace edema right lower leg, left AKA.             Hypertension:  BP today with pt's machine- 124/60, nurse's cuff 128/60, both right arm.                                       Pt's last BP reading taken by spouse- 134/97.             Cognitive status:  No issues today during home visit, spouse reports pt has thought he was              Someone other than her husband.              Fall risk- per spouse, pt slipped off the chair twice last week transferring chair to wheel               Chair landing on the floor, getting difficult for spouse to pick up.  Per spouse, pt up at               Night, sleeping during the day.             DM- per spouse pt's sugars range 78-101 in am, one time 250 after eating, recheck 98.            Left AKA- per pt stopped wearing prothesis 1.5 years ago, not comfortable.       Plan:  Plan to continue to provide pt with ongoing community nurse case management services,             Follow up again next month - home visit.             As discussed, spouse to request of Dr. Manuella Ghazi at upcoming office visit to test pt for Dementia,               Sleep aid for pt.              As discussed, spouse to ask pt's sister to help with pt during the day.             Informed Dr. Manuella Ghazi of [facility] involvement- barrier letter by in basket.            Plan to send by in basket to  Dr. Manuella Ghazi clinical notes from home visit encounter.   [facility] CM Care Plan Problem One   Flowsheet Row Most Recent Value  Care  Plan Problem One  Hx of Hypertension   Role Documenting the Problem One  Care Management Coordinator  Care Plan for Problem One  Active  [facility] Long Term Goal (31-90 days)  Pt BP would continue to be within normal limits in the next 40 days   [facility] Long Term Goal Start Date  03/09/16  Interventions for Problem One Long Term Goal  Initial home  visit done, compared pt's BP with her BP machine, RN CM's cuff.   [facility] CM Short Term Goal #1 (0-30 days)  Pt/spouse would have better understanding of Low Na+ diet within the next 30 days   [facility] CM Short Term Goal #1 Start Date  03/09/16  Interventions for Short Term Goal #1  Reviewed with pt/spouse Low Na+ foods, foods to avoid, reading food labels for sodium content.     [facility] CM Care Plan Problem Two   Flowsheet Row Most Recent Value  Care Plan Problem Two  Fall risk   Role Documenting the Problem Two  Care Management Coordinator  Care Plan for Problem Two  Active  [facility] CM Short Term Goal #1 (0-30 days)  Pt would not have any falls within the next 30 days   [facility] CM Short Term Goal #1 Start Date  03/09/16  Interventions for Short Term Goal #2   Discussed with pt to have spouse close by to help when transferring from chair to wheelchair.       Zara Chess.   [place] [facility] Management  [phone]

## 2016-03-11 ENCOUNTER — Encounter: Payer: Self-pay | Admitting: Family Medicine

## 2016-03-11 ENCOUNTER — Ambulatory Visit (INDEPENDENT_AMBULATORY_CARE_PROVIDER_SITE_OTHER): Payer: Commercial Managed Care - HMO | Admitting: Family Medicine

## 2016-03-11 ENCOUNTER — Telehealth: Payer: Self-pay | Admitting: Family Medicine

## 2016-03-11 VITALS — BP 130/80 | HR 68 | Temp 98.2°F | Resp 16

## 2016-03-11 DIAGNOSIS — N183 Chronic kidney disease, stage 3 (moderate): Secondary | ICD-10-CM | POA: Diagnosis not present

## 2016-03-11 DIAGNOSIS — Z993 Dependence on wheelchair: Secondary | ICD-10-CM | POA: Insufficient documentation

## 2016-03-11 DIAGNOSIS — E1122 Type 2 diabetes mellitus with diabetic chronic kidney disease: Secondary | ICD-10-CM

## 2016-03-11 MED ORDER — GLUCOSE BLOOD VI STRP: ORAL_STRIP | 12 refills | Status: DC

## 2016-03-11 NOTE — Progress Notes (Signed)
Name: Amy [name]   MRN: [id number]    DOB: 02-Oct-1935   Date:03/11/2016       Progress Note  Subjective  Chief Complaint  Chief Complaint  Patient presents with  . Follow-up    Need to discuss need for wheel chair    HPI  Pt. Presents to obtain prescription for a wheelchair and for documentation to prove medical necessity. She had a left AKA in 2007 secondary to peripheral vascular disease, since her amputation she is dependent on the wheelchair for all her ambulation needs. The handle on the current wheelchair is broken and she is in need of a new wheelchair.    Past Medical History:  Diagnosis Date  . Anxiety   . Bronchitis    hx of   . Cataract   . Chronic kidney disease   . Depression   . Diabetes mellitus without complication ([place])   . Dry cough   . 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 GERD (gastroesophageal reflux disease)   . History of frequent urinary tract infections   . Hyperlipidemia   . Hypertension   . Left renal mass   . Numbness and tingling    right hand   . Obesity   . Peripheral artery disease ([place])   . Urinary frequency   . Urinary incontinence     Past Surgical History:  Procedure Laterality Date  . ABDOMINAL HYSTERECTOMY  1978  . CARPAL TUNNEL RELEASE Bilateral   . CHOLECYSTECTOMY    . LEG AMPUTATION     above the knee / left   . ROBOT ASSISTED LAPAROSCOPIC NEPHRECTOMY Left 01/15/2015   Procedure: ROBOTIC ASSISTED LAPAROSCOPIC RADICAL NEPHRECTOMY;  Surgeon: Amy Frock, MD;  Location: [facility];  Service: Urology;  Laterality: Left;  . stent placement right leg       Family History  Problem Relation Age of Onset  . Cancer Mother     Stomach  . Cancer Father   . Diabetes Brother   . Cancer Brother     Social History   Social History  . Marital status: Married    Spouse name: N/A  . Number of children: N/A  . Years of education: N/A   Occupational History  . Not on file.    Social History Main Topics  . Smoking status: Former Smoker    Packs/day: 1.50    Years: 51.00    Types: Cigarettes    Quit date: 05/24/2004  . Smokeless tobacco: Never Used  . Alcohol use No  . Drug use: No  . Sexual activity: Not Currently   Other Topics Concern  . Not on file   Social History Narrative  . No narrative on file     Current Outpatient Prescriptions:  .  acetaminophen (TYLENOL) 500 MG tablet, Take 500 mg by mouth every 6 (six) hours as needed. Pt takes 2 tablets as needed., Disp: , Rfl:  .  ALPRAZolam (XANAX) 0.5 MG tablet, Take 1 tablet (0.5 mg total) by mouth 3 (three) times daily as needed for anxiety., Disp: 90 tablet, Rfl: 0 .  citalopram (CELEXA) 20 MG tablet, TAKE 1 TABLET AT BEDTIME, Disp: 90 tablet, Rfl: 0 .  clopidogrel (PLAVIX) 75 MG tablet, TAKE 1 TABLET EVERY DAY, Disp: 90 tablet, Rfl: 0 .  gabapentin (NEURONTIN) 300 MG capsule, Take 1 capsule (300 mg total) by mouth 3 (three) times daily., Disp: 270 capsule, Rfl: 1 .  HYDROcodone-acetaminophen (NORCO) 7.5-325 MG tablet, Take 1 tablet by mouth 3 (three) times daily as needed., Disp: 90 tablet, Rfl: 0 .  metoprolol (LOPRESSOR) 50 MG tablet, TAKE 1 TABLET TWICE DAILY, Disp: 180 tablet, Rfl: 0 .  Multiple Vitamins-Minerals (HAIR SKIN AND NAILS FORMULA) TABS, Take by mouth. One tablet daily, Disp: , Rfl:  .  pantoprazole (PROTONIX) 40 MG tablet, TAKE 1 TABLET (40 MG TOTAL) BY MOUTH DAILY., Disp: 90 tablet, Rfl: 1 .  ranitidine (ZANTAC) 150 MG capsule, TAKE 1 CAPSULE (150 MG TOTAL) BY MOUTH AT BEDTIME., Disp: 90 capsule, Rfl: 1 .  simvastatin (ZOCOR) 40 MG tablet, TAKE 1 TABLET EVERY DAY, Disp: 90 tablet, Rfl: 0 .  VENTOLIN HFA 108 (90 BASE) MCG/ACT inhaler, Inhale 1-2 puffs into the lungs every 4 (four) hours as needed for wheezing or shortness of breath. , Disp: , Rfl:  .  Vitamin D, Ergocalciferol, (DRISDOL) 50000 UNITS CAPS capsule, , Disp: , Rfl:   Allergies  Allergen Reactions  . Fentanyl Other (See  Comments)    urine retention  . Penicillins Itching and Rash     ROS    Objective  Vitals:   03/11/16 1511  BP: 130/80  Pulse: 68  Resp: 16  Temp: 98.2 F (36.8 C)  SpO2: 94%    Physical Exam  Constitutional: She is well-developed, well-nourished, and in no distress.  Appears fatigued, sitting in wheelchair,   HENT:  Head: Normocephalic.  Musculoskeletal:  Left Above-knee amputation.  Psychiatric: Mood, affect and judgment normal.  Nursing note and vitals reviewed.     Assessment & Plan  1. Wheelchair dependent Prescription and paperwork associated with new wheelchair is completed and signed.  2. Controlled type 2 diabetes mellitus with stage 3 chronic kidney disease, without long-term current use of insulin ([place]) Patient checks her glucose occasionally, has diabetes well-controlled without pharmacotherapy. Prescription for test strips provided. - glucose blood test strip; Use as instructed.  Dispense: 100 each; Refill: 12  Dorethy Tomey [facility]ad [facility] 03/11/2016 3:22 PM

## 2016-03-23 DIAGNOSIS — Z89612 Acquired absence of left leg above knee: Secondary | ICD-10-CM | POA: Diagnosis not present

## 2016-03-23 DIAGNOSIS — S31125A Laceration of abdominal wall with foreign body, periumbilic region without penetration into peritoneal cavity, initial encounter: Secondary | ICD-10-CM | POA: Diagnosis not present

## 2016-03-25 ENCOUNTER — Ambulatory Visit (INDEPENDENT_AMBULATORY_CARE_PROVIDER_SITE_OTHER): Payer: Commercial Managed Care - HMO | Admitting: Family Medicine

## 2016-03-25 ENCOUNTER — Encounter: Payer: Self-pay | Admitting: Family Medicine

## 2016-03-25 DIAGNOSIS — F419 Anxiety disorder, unspecified: Secondary | ICD-10-CM

## 2016-03-25 DIAGNOSIS — G546 Phantom limb syndrome with pain: Secondary | ICD-10-CM | POA: Diagnosis not present

## 2016-03-25 MED ORDER — ALPRAZOLAM 0.5 MG PO TABS: 0.5000 mg | ORAL_TABLET | Freq: Three times a day (TID) | ORAL | 0 refills | Status: DC | PRN

## 2016-03-25 MED ORDER — HYDROCODONE-ACETAMINOPHEN 7.5-325 MG PO TABS: 1.0000 | ORAL_TABLET | Freq: Three times a day (TID) | ORAL | 0 refills | Status: DC | PRN

## 2016-03-25 NOTE — Progress Notes (Signed)
Name: Amy [name]   MRN: [id number]    DOB: 04/30/1936   Date:03/25/2016       Progress Note  Subjective  Chief Complaint  Chief Complaint  Patient presents with  . Follow-up    medication refills  . Dementia    Anxiety  Presents for follow-up visit. The problem has been unchanged. Symptoms include excessive worry and nervous/anxious behavior. Patient reports no insomnia, palpitations, panic or restlessness. Symptoms occur most days. The severity of symptoms is moderate and causing significant distress. The quality of sleep is fair.   Her past medical history is significant for anxiety/panic attacks. Past treatments include benzodiazephines. The treatment provided moderate relief. Compliance with prior treatments has been good.   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 Pain feels the pain coming from her left leg (which has been amputated). Pain gets worse at night, doesn't really bother her during the day. She takes Hydrocodone-Acetaminophen 7.5-325 mg three times daily as needed and Gabapentin for relief.  Past Medical History:  Diagnosis Date  . Anxiety   . Bronchitis    hx of   . Cataract   . Chronic kidney disease   . Depression   . Diabetes mellitus without complication ([place])   . Dry cough   . 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 GERD (gastroesophageal reflux disease)   . History of frequent urinary tract infections   . Hyperlipidemia   . Hypertension   . Left renal mass   . Numbness and tingling    right hand   . Obesity   . Peripheral artery disease ([place])   . Urinary frequency   . Urinary incontinence     Past Surgical History:  Procedure Laterality Date  . ABDOMINAL HYSTERECTOMY  1978  . CARPAL TUNNEL RELEASE Bilateral   . CHOLECYSTECTOMY    . LEG AMPUTATION     above the knee / left   . ROBOT ASSISTED LAPAROSCOPIC NEPHRECTOMY Left 01/15/2015   Procedure:  ROBOTIC ASSISTED LAPAROSCOPIC RADICAL NEPHRECTOMY;  Surgeon: Alexis Frock, MD;  Location: [facility];  Service: Urology;  Laterality: Left;  . stent placement right leg       Family History  Problem Relation Age of Onset  . Cancer Mother     Stomach  . Cancer Father   . Diabetes Brother   . Cancer Brother     Social History   Social History  . Marital status: Married    Spouse name: N/A  . Number of children: N/A  . Years of education: N/A   Occupational History  . Not on file.   Social History Main Topics  . Smoking status: Former Smoker    Packs/day: 1.50    Years: 51.00    Types: Cigarettes    Quit date: 05/24/2004  . Smokeless tobacco: Never Used  . Alcohol use No  . Drug use: No  . Sexual activity: Not Currently   Other Topics Concern  . Not on file   Social History Narrative  . No narrative on file     Current Outpatient Prescriptions:  .  acetaminophen (TYLENOL) 500 MG tablet, Take 500 mg by mouth every 6 (six) hours as needed. Pt takes 2 tablets as needed., Disp: , Rfl:  .  ALPRAZolam (XANAX) 0.5 MG tablet, Take 1 tablet (0.5 mg total) by mouth 3 (three) times daily as needed  for anxiety., Disp: 90 tablet, Rfl: 0 .  citalopram (CELEXA) 20 MG tablet, TAKE 1 TABLET AT BEDTIME, Disp: 90 tablet, Rfl: 0 .  clopidogrel (PLAVIX) 75 MG tablet, TAKE 1 TABLET EVERY DAY, Disp: 90 tablet, Rfl: 0 .  gabapentin (NEURONTIN) 300 MG capsule, Take 1 capsule (300 mg total) by mouth 3 (three) times daily., Disp: 270 capsule, Rfl: 1 .  glucose blood test strip, Use as instructed., Disp: 100 each, Rfl: 12 .  HYDROcodone-acetaminophen (NORCO) 7.5-325 MG tablet, Take 1 tablet by mouth 3 (three) times daily as needed., Disp: 90 tablet, Rfl: 0 .  metoprolol (LOPRESSOR) 50 MG tablet, TAKE 1 TABLET TWICE DAILY, Disp: 180 tablet, Rfl: 0 .  Multiple Vitamins-Minerals (HAIR SKIN AND NAILS FORMULA) TABS, Take by mouth. One tablet daily, Disp: , Rfl:  .  pantoprazole (PROTONIX) 40 MG tablet,  TAKE 1 TABLET (40 MG TOTAL) BY MOUTH DAILY., Disp: 90 tablet, Rfl: 1 .  ranitidine (ZANTAC) 150 MG capsule, TAKE 1 CAPSULE (150 MG TOTAL) BY MOUTH AT BEDTIME., Disp: 90 capsule, Rfl: 1 .  simvastatin (ZOCOR) 40 MG tablet, TAKE 1 TABLET EVERY DAY, Disp: 90 tablet, Rfl: 0 .  VENTOLIN HFA 108 (90 BASE) MCG/ACT inhaler, Inhale 1-2 puffs into the lungs every 4 (four) hours as needed for wheezing or shortness of breath. , Disp: , Rfl:  .  Vitamin D, Ergocalciferol, (DRISDOL) 50000 UNITS CAPS capsule, , Disp: , Rfl:   Allergies  Allergen Reactions  . Fentanyl Other (See Comments)    urine retention  . Penicillins Itching and Rash     Review of Systems  Cardiovascular: Negative for palpitations.  Psychiatric/Behavioral: The patient is nervous/anxious. The patient does not have insomnia.     Objective  Vitals:   03/25/16 1120  BP: 122/84  Pulse: 64  Resp: 16  Temp: 98 F (36.7 C)  TempSrc: Oral  SpO2: 94%    Physical Exam  Constitutional: She is oriented to person, place, and time and well-developed, well-nourished, and in no distress.  HENT:  Head: Normocephalic and atraumatic.  Cardiovascular: Normal rate, regular rhythm and normal heart sounds.   No murmur heard. Pulmonary/Chest: Effort normal and breath sounds normal.  Musculoskeletal:  Left above-knee amputation.  Neurological: She is alert and oriented to person, place, and time.  Psychiatric: Mood, memory, affect and judgment normal.  Nursing note and vitals reviewed.     Assessment & Plan  1. Anxiety Stable, responsive to alprazolam taken up to 3 times daily as needed. Patient compliant with controlled substances agreement and understands the dependence potential, side effects, drug interactions of opioids. Refills provided and follow-up in one month - ALPRAZolam (XANAX) 0.5 MG tablet; Take 1 tablet (0.5 mg total) by mouth 3 (three) times daily as needed for anxiety.  Dispense: 90 tablet; Refill: 0  2. Phantom  limb syndrome with pain (HCC) Stable on hydrocodone taken up to 3 times daily as needed, patient compliant with controlled substances agreement and understands the dependence potential, side effects, and regulations of opioids. Refills provided and follow-up in one month - HYDROcodone-acetaminophen (NORCO) 7.5-325 MG tablet; Take 1 tablet by mouth 3 (three) times daily as needed.  Dispense: 90 tablet; Refill: 0   Chimamanda Siegfried Asad A. [name] 03/25/2016 11:45 AM

## 2016-03-31 ENCOUNTER — Encounter: Payer: Self-pay | Admitting: Family Medicine

## 2016-03-31 ENCOUNTER — Ambulatory Visit (INDEPENDENT_AMBULATORY_CARE_PROVIDER_SITE_OTHER): Payer: Commercial Managed Care - HMO | Admitting: Family Medicine

## 2016-03-31 DIAGNOSIS — Z79899 Other long term (current) drug therapy: Secondary | ICD-10-CM | POA: Diagnosis not present

## 2016-03-31 DIAGNOSIS — R441 Visual hallucinations: Secondary | ICD-10-CM

## 2016-03-31 DIAGNOSIS — F419 Anxiety disorder, unspecified: Secondary | ICD-10-CM | POA: Diagnosis not present

## 2016-03-31 DIAGNOSIS — I10 Essential (primary) hypertension: Secondary | ICD-10-CM | POA: Diagnosis not present

## 2016-03-31 DIAGNOSIS — H5316 Psychophysical visual disturbances: Secondary | ICD-10-CM | POA: Diagnosis not present

## 2016-03-31 DIAGNOSIS — G546 Phantom limb syndrome with pain: Secondary | ICD-10-CM | POA: Diagnosis not present

## 2016-03-31 NOTE — Progress Notes (Signed)
Name: Amy [name]   MRN: [id number]    DOB: 23-Sep-1935   Date:03/31/2016       Progress Note  Subjective  Chief Complaint  Chief Complaint  Patient presents with  . Dementia    evaluation     HPI  Pt. Presents for evaluation of dementia, per husband he has noticed that she often believes that he (husband) is Derald Macleod (who was her stepfather and has been deceased for 25 years). She often has refused to recognize him as her husband, also has noticed that she forgets where she is (wanted to go back and check on her house keys that she no longer lives in).  She is retired now, worked in Tree [employer]. She stays mostly at home.  Past Medical History:  Diagnosis Date  . Anxiety   . Bronchitis    hx of   . Cataract   . Chronic kidney disease   . Depression   . Diabetes mellitus without complication ([place])   . Dry cough   . 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 GERD (gastroesophageal reflux disease)   . History of frequent urinary tract infections   . Hyperlipidemia   . Hypertension   . Left renal mass   . Numbness and tingling    right hand   . Obesity   . Peripheral artery disease ([place])   . Urinary frequency   . Urinary incontinence     Past Surgical History:  Procedure Laterality Date  . ABDOMINAL HYSTERECTOMY  1978  . CARPAL TUNNEL RELEASE Bilateral   . CHOLECYSTECTOMY    . LEG AMPUTATION     above the knee / left   . ROBOT ASSISTED LAPAROSCOPIC NEPHRECTOMY Left 01/15/2015   Procedure: ROBOTIC ASSISTED LAPAROSCOPIC RADICAL NEPHRECTOMY;  Surgeon: Alexis Frock, MD;  Location: [facility];  Service: Urology;  Laterality: Left;  . stent placement right leg       Family History  Problem Relation Age of Onset  . Cancer Mother     Stomach  . Cancer Father   . Diabetes Brother   . Cancer Brother     Social History   Social History  . Marital status: Married    Spouse name: N/A  . Number of children: N/A   . Years of education: N/A   Occupational History  . Not on file.   Social History Main Topics  . Smoking status: Former Smoker    Packs/day: 1.50    Years: 51.00    Types: Cigarettes    Quit date: 05/24/2004  . Smokeless tobacco: Never Used  . Alcohol use No  . Drug use: No  . Sexual activity: Not Currently   Other Topics Concern  . Not on file   Social History Narrative  . No narrative on file     Current Outpatient Prescriptions:  .  acetaminophen (TYLENOL) 500 MG tablet, Take 500 mg by mouth every 6 (six) hours as needed. Pt takes 2 tablets as needed., Disp: , Rfl:  .  ALPRAZolam (XANAX) 0.5 MG tablet, Take 1 tablet (0.5 mg total) by mouth 3 (three) times daily as needed for anxiety., Disp: 90 tablet, Rfl: 0 .  citalopram (CELEXA) 20 MG tablet, TAKE 1 TABLET AT BEDTIME, Disp: 90 tablet, Rfl: 0 .  clopidogrel (PLAVIX) 75 MG tablet, TAKE 1 TABLET EVERY DAY, Disp: 90 tablet, Rfl: 0 .  gabapentin (NEURONTIN) 300 MG capsule, Take  1 capsule (300 mg total) by mouth 3 (three) times daily., Disp: 270 capsule, Rfl: 1 .  glucose blood test strip, Use as instructed., Disp: 100 each, Rfl: 12 .  HYDROcodone-acetaminophen (NORCO) 7.5-325 MG tablet, Take 1 tablet by mouth 3 (three) times daily as needed., Disp: 90 tablet, Rfl: 0 .  metoprolol (LOPRESSOR) 50 MG tablet, TAKE 1 TABLET TWICE DAILY, Disp: 180 tablet, Rfl: 0 .  Multiple Vitamins-Minerals (HAIR SKIN AND NAILS FORMULA) TABS, Take by mouth. One tablet daily, Disp: , Rfl:  .  pantoprazole (PROTONIX) 40 MG tablet, TAKE 1 TABLET (40 MG TOTAL) BY MOUTH DAILY., Disp: 90 tablet, Rfl: 1 .  ranitidine (ZANTAC) 150 MG capsule, TAKE 1 CAPSULE (150 MG TOTAL) BY MOUTH AT BEDTIME., Disp: 90 capsule, Rfl: 1 .  simvastatin (ZOCOR) 40 MG tablet, TAKE 1 TABLET EVERY DAY, Disp: 90 tablet, Rfl: 0 .  VENTOLIN HFA 108 (90 BASE) MCG/ACT inhaler, Inhale 1-2 puffs into the lungs every 4 (four) hours as needed for wheezing or shortness of breath. , Disp: ,  Rfl:  .  Vitamin D, Ergocalciferol, (DRISDOL) 50000 UNITS CAPS capsule, , Disp: , Rfl:   Allergies  Allergen Reactions  . Fentanyl Other (See Comments)    urine retention  . Penicillins Itching and Rash     ROS Please see history of present illness for review of systems  Objective  There were no vitals filed for this visit.  Physical Exam  Constitutional: She is oriented to person, place, and time and well-developed, well-nourished, and in no distress.  Neurological: She is alert and oriented to person, place, and time.  Psychiatric: Memory, affect and judgment normal.  Nursing note and vitals reviewed.      Assessment & Plan  1. Spells of formed visual hallucinations MMSE was administered, patient scored 26 out of 30 which is considered normal. I suspect she is having visual hallucinations, we'll obtain lab work and refer to neurology for further management - CBC with Differential - B12 - TSH - Ambulatory referral to Neurology - Drug Screen, Urine  Total face-to-face time spent 25 minutes, which included administering MMSE, scoring patient's responses, explaining the meaning of patient's score and further clinical management. Sabriya Yono Asad A. [facility] 03/31/2016 3:13 PM

## 2016-04-01 ENCOUNTER — Ambulatory Visit: Payer: Commercial Managed Care - HMO | Admitting: Family Medicine

## 2016-04-01 ENCOUNTER — Encounter: Payer: Self-pay | Admitting: Family Medicine

## 2016-04-01 LAB — CBC WITH DIFFERENTIAL/PLATELET
BASOS PCT: 1 %
Basophils Absolute: 93 cells/uL (ref 0–200)
EOS PCT: 8 %
Eosinophils Absolute: 744 cells/uL — ABNORMAL HIGH (ref 15–500)
HCT: 34.6 % — ABNORMAL LOW (ref 35.0–45.0)
HEMOGLOBIN: 11 g/dL — AB (ref 11.7–15.5)
LYMPHS ABS: 2046 {cells}/uL (ref 850–3900)
Lymphocytes Relative: 22 %
MCH: 28.9 pg (ref 27.0–33.0)
MCHC: 31.8 g/dL — AB (ref 32.0–36.0)
MCV: 90.8 fL (ref 80.0–100.0)
MONO ABS: 651 {cells}/uL (ref 200–950)
MPV: 10.3 fL (ref 7.5–12.5)
Monocytes Relative: 7 %
NEUTROS ABS: 5766 {cells}/uL (ref 1500–7800)
NEUTROS PCT: 62 %
Platelets: 242 10*3/uL (ref 140–400)
RBC: 3.81 MIL/uL (ref 3.80–5.10)
RDW: 15.4 % — ABNORMAL HIGH (ref 11.0–15.0)
WBC: 9.3 10*3/uL (ref 3.8–10.8)

## 2016-04-01 LAB — VITAMIN B12: VITAMIN B 12: 456 pg/mL (ref 200–1100)

## 2016-04-01 LAB — TSH: TSH: 2.28 m[IU]/L

## 2016-04-02 ENCOUNTER — Telehealth: Payer: Self-pay | Admitting: Family Medicine

## 2016-04-02 NOTE — Telephone Encounter (Signed)
Amy [name] from [place] states patient has appt 04/13/16 @ 9:45 she is needing [facility] authorization. Will be seeing Dr. Jennings Books (P) [phone]

## 2016-04-03 ENCOUNTER — Other Ambulatory Visit: Payer: Self-pay | Admitting: Family Medicine

## 2016-04-04 LAB — DRUG ABUSE PANEL 10-50, U
AMPHETAMINES (1000 ng/mL SCRN): NEGATIVE
BARBITURATES: NEGATIVE
BENZODIAZEPINES: POSITIVE — AB
COCAINE METABOLITES: NEGATIVE
CODEINE: NEGATIVE
HYDROCODONE: POSITIVE — AB
HYDROMORPHONE: POSITIVE — AB
MARIJUANA MET (50 NG/ML SCRN): NEGATIVE
METHADONE: NEGATIVE
METHAQUALONE: NEGATIVE
MORPHINE: NEGATIVE
OPIATES: POSITIVE — AB
PHENCYCLIDINE: NEGATIVE
PROPOXYPHENE: NEGATIVE

## 2016-04-05 NOTE — Telephone Encounter (Signed)
[employer] Auth. # [id number] visits from 04/13/16 - 10/10/2016

## 2016-04-09 ENCOUNTER — Ambulatory Visit: Payer: Self-pay | Admitting: *Deleted

## 2016-04-09 ENCOUNTER — Other Ambulatory Visit: Payer: Self-pay | Admitting: *Deleted

## 2016-04-09 NOTE — Patient Outreach (Signed)
[facility]) Care Management   04/09/2016  Amy [name] [birth year] [id number]  Amy [name] is an 80 y.o. female  Subjective:   Spouse reports pt saw Dr. Manuella Ghazi, labs for UTI and test for Dementia, all normal. Spouse  Reports MD set up appointment for pt to see brain MD (pt hallucinating thinking he is her step dad at  [employer]).    Spouse reports pt had a fall this week transferring from chair to wheelchair.  Spouse reports Pt stays up at night while he goes to bed, drinks several Pepsi Zero and then sleeps during the  Day.     Objective:   Vitals:   04/09/16 1336  BP: 122/60  Pulse: (!) 53  Resp: 20    ROS  Physical Exam  Constitutional: She is oriented to person, place, and time. She appears well-developed and well-nourished.  Cardiovascular: Regular rhythm and normal heart sounds.   GI: Soft. Bowel sounds are normal.  Musculoskeletal: She exhibits edema.  Trace edema right ankle, Left AKA.   Neurological: She is alert and oriented to person, place, and time.  Skin: Skin is warm and dry.  Psychiatric: She has a normal mood and affect. Her behavior is normal. Judgment and thought content normal.    Encounter Medications:   Outpatient Encounter Prescriptions as of 04/09/2016  Medication Sig Note  . acetaminophen (TYLENOL) 500 MG tablet Take 500 mg by mouth every 6 (six) hours as needed. Pt takes 2 tablets as needed. 04/09/2016: Only as needed.   . ALPRAZolam (XANAX) 0.5 MG tablet Take 1 tablet (0.5 mg total) by mouth 3 (three) times daily as needed for anxiety.   . citalopram (CELEXA) 20 MG tablet TAKE 1 TABLET AT BEDTIME   . clopidogrel (PLAVIX) 75 MG tablet TAKE 1 TABLET EVERY DAY   . docusate sodium (COLACE) 100 MG capsule Take 100 mg by mouth daily. Pt taking once a day with breakfast   . gabapentin (NEURONTIN) 300 MG capsule Take 1 capsule (300 mg total) by mouth 3 (three) times daily.   [name] glucose blood test strip Use as instructed.   .  metoprolol (LOPRESSOR) 50 MG tablet TAKE 1 TABLET TWICE DAILY   . Multiple Vitamins-Minerals (HAIR SKIN AND NAILS FORMULA) TABS Take by mouth. One tablet daily   . pantoprazole (PROTONIX) 40 MG tablet TAKE 1 TABLET (40 MG TOTAL) BY MOUTH DAILY.   . ranitidine (ZANTAC) 150 MG capsule TAKE 1 CAPSULE (150 MG TOTAL) BY MOUTH AT BEDTIME.   . simvastatin (ZOCOR) 40 MG tablet TAKE 1 TABLET EVERY DAY   . HYDROcodone-acetaminophen (NORCO) 7.5-325 MG tablet Take 1 tablet by mouth 3 (three) times daily as needed. (Patient not taking: Reported on 04/09/2016)   . VENTOLIN HFA 108 (90 BASE) MCG/ACT inhaler Inhale 1-2 puffs into the lungs every 4 (four) hours as needed for wheezing or shortness of breath.    . Vitamin D, Ergocalciferol, (DRISDOL) 50000 UNITS CAPS capsule  03/13/2015: Received from: External Pharmacy   No facility-administered encounter medications on file as of 04/09/2016.     Functional Status:   In your present state of health, do you have any difficulty performing the following activities: 03/31/2016 03/09/2016  Hearing? N Y  Vision? Y N  Difficulty concentrating or making decisions? [name]  Walking or climbing stairs? Y -  Dressing or bathing? Y -  Doing errands, shopping? Y -  Conservation officer, nature and eating ? - Y  Using the Toilet? - Y  In the past six months, have you accidently leaked urine? - Y  Do you have problems with loss of bowel control? - Y  Managing your Medications? - Y  Managing your Finances? - Y  Housekeeping or managing your Housekeeping? - Y  Some recent data might be hidden    Fall/Depression Screening:    PHQ 2/9 Scores 03/31/2016 03/09/2016 02/27/2016 01/21/2016 11/24/2015 10/27/2015 09/25/2015  PHQ - 2 Score 0 2 - 0 0 0 0  PHQ- 9 Score - 17 - - - - -  Exception Documentation - - Other- indicate reason in comment box - - - -  Not completed - - spoke with spouse - - - -    Assessment:  Pleasant 5 year old woman, spouse present during home visit.                          Lungs clear, no c/o sob, chest pain, pain.   Trace edema in right ankle.                         Pt dozing off during home visit- per spouse up during the night.                          Hypertension- 122/60.                           Fall risk- per spouse, pt had a fall this week.    Plan:  As discussed, pt to try switching to caffeine free drink for 30 days, help with sleeping  at             Night.             Plan to continue to provide community nurse case management services, follow up again              Next month- home visit.    [facility] CM Care Plan Problem One   Flowsheet Row Most Recent Value  Care Plan Problem One  Hx of Hypertension   Role Documenting the Problem One  Care Management Coordinator  Care Plan for Problem One  Active  [facility] Long Term Goal (31-90 days)  Pt's BP would continue to be within normal limits within the next 40 days   [facility] Long Term Goal Start Date  03/09/16  [facility] Long Term Goal Met Date  04/09/16  [facility] CM Short Term Goal #1 (0-30 days)  pt/spouse would have better understanding of Low Na+ diet within the next 30 days   [facility] CM Short Term Goal #1 Start Date  03/09/16  [facility] CM Short Term Goal #1 Met Date  04/09/16 [not addressed in time frame ]    [facility] CM Care Plan Problem Two   Flowsheet Row Most Recent Value  Care Plan Problem Two  Fall risk   Role Documenting the Problem Two  Care Management Assistant  Care Plan for Problem Two  Active  Interventions for Problem Two Long Term Goal   Discussed with pt to only do wheel chair transfers with spouse present   [facility] Long Term Goal (31-90) days  Pt would not have any falls within the next 40 days   [facility] Long Term Goal Start Date  04/09/16  [facility] CM Short Term Goal #1 (0-30 days)  Pt would not have any falls  within the next 30 days   [facility] CM Short Term Goal #1 Start Date  03/09/16  [facility] CM Short Term Goal #1 Met Date   -- [not met- pt had a fall this week, no injury ]  [facility] CM Short Term Goal #2 (0-30 days)  Pt would switch  to non caffeine drink for the next 30 days.   [facility] CM Short Term Goal #2 Start Date  04/09/16  Interventions for Short Term Goal #2  Discussed with pt (spouse present) drinking a lot of Pepsi Zero at night could be what is keeping her up at night, sleeping during the day, thus increasing her fall risk.   [facility] CM Short Term Goal #3 (0-30 days)  Pt would exercise daily for the next 30 days   [facility] CM Short Term Goal #3 Start Date  04/09/16  Interventions for Short Term Goal #3  Discussed with pt benefit of doing sit down exercises during the day, help with sleeping at night, decrease falls.      Zara Chess.   [facility]  [phone]

## 2016-04-13 DIAGNOSIS — F0281 Dementia in other diseases classified elsewhere with behavioral disturbance: Secondary | ICD-10-CM | POA: Diagnosis not present

## 2016-04-13 DIAGNOSIS — G308 Other Alzheimer's disease: Secondary | ICD-10-CM | POA: Diagnosis not present

## 2016-04-13 DIAGNOSIS — G546 Phantom limb syndrome with pain: Secondary | ICD-10-CM | POA: Diagnosis not present

## 2016-04-13 DIAGNOSIS — F411 Generalized anxiety disorder: Secondary | ICD-10-CM | POA: Diagnosis not present

## 2016-04-20 ENCOUNTER — Encounter: Payer: Self-pay | Admitting: Family Medicine

## 2016-04-20 ENCOUNTER — Ambulatory Visit (INDEPENDENT_AMBULATORY_CARE_PROVIDER_SITE_OTHER): Payer: Commercial Managed Care - HMO | Admitting: Family Medicine

## 2016-04-20 DIAGNOSIS — F419 Anxiety disorder, unspecified: Secondary | ICD-10-CM | POA: Diagnosis not present

## 2016-04-20 DIAGNOSIS — G546 Phantom limb syndrome with pain: Secondary | ICD-10-CM

## 2016-04-20 MED ORDER — HYDROCODONE-ACETAMINOPHEN 7.5-325 MG PO TABS: 1.0000 | ORAL_TABLET | Freq: Three times a day (TID) | ORAL | 0 refills | Status: DC | PRN

## 2016-04-20 MED ORDER — ALPRAZOLAM 0.5 MG PO TABS: 0.5000 mg | ORAL_TABLET | Freq: Three times a day (TID) | ORAL | 0 refills | Status: DC | PRN

## 2016-04-20 NOTE — Progress Notes (Signed)
Name: Amy [name]   MRN: [id number]    DOB: 15-Sep-1935   Date:04/20/2016       Progress Note  Subjective  Chief Complaint  Chief Complaint  Patient presents with  . Follow-up    Anxiety  Presents for follow-up visit. Symptoms include excessive worry and nervous/anxious behavior. Patient reports no insomnia, muscle tension, palpitations, panic or shortness of breath. The severity of symptoms is moderate. The quality of sleep is good.    Phantom Limb Pain: Phantom limb pain since her left above knee amputation in 2007. Pain feels the pain coming from her left leg (which has been amputated). Pain gets worse at night, doesn't really bother her during the day. She takes Hydrocodone-Acetaminophen 7.5-325 mg three times daily as needed and Gabapentin for relief.    Past Medical History:  Diagnosis Date  . Anxiety   . Bronchitis    hx of   . Cataract   . Chronic kidney disease   . Depression   . Diabetes mellitus without complication ([place])   . Dry cough   . 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 GERD (gastroesophageal reflux disease)   . History of frequent urinary tract infections   . Hyperlipidemia   . Hypertension   . Left renal mass   . Numbness and tingling    right hand   . Obesity   . Peripheral artery disease ([place])   . Urinary frequency   . Urinary incontinence     Past Surgical History:  Procedure Laterality Date  . ABDOMINAL HYSTERECTOMY  1978  . CARPAL TUNNEL RELEASE Bilateral   . CHOLECYSTECTOMY    . LEG AMPUTATION     above the knee / left   . ROBOT ASSISTED LAPAROSCOPIC NEPHRECTOMY Left 01/15/2015   Procedure: ROBOTIC ASSISTED LAPAROSCOPIC RADICAL NEPHRECTOMY;  Surgeon: Alexis Frock, MD;  Location: [facility];  Service: Urology;  Laterality: Left;  . stent placement right leg       Family History  Problem Relation Age of Onset  . Cancer Mother     Stomach  . Cancer Father   . Diabetes Brother   .  Cancer Brother     Social History   Social History  . Marital status: Married    Spouse name: N/A  . Number of children: N/A  . Years of education: N/A   Occupational History  . Not on file.   Social History Main Topics  . Smoking status: Former Smoker    Packs/day: 1.50    Years: 51.00    Types: Cigarettes    Quit date: 05/24/2004  . Smokeless tobacco: Never Used  . Alcohol use No  . Drug use: No  . Sexual activity: Not Currently   Other Topics Concern  . Not on file   Social History Narrative  . No narrative on file     Current Outpatient Prescriptions:  .  acetaminophen (TYLENOL) 500 MG tablet, Take 500 mg by mouth every 6 (six) hours as needed. Pt takes 2 tablets as needed., Disp: , Rfl:  .  ALPRAZolam (XANAX) 0.5 MG tablet, Take 1 tablet (0.5 mg total) by mouth 3 (three) times daily as needed for anxiety., Disp: 90 tablet, Rfl: 0 .  citalopram (CELEXA) 20 MG tablet, TAKE 1 TABLET AT BEDTIME, Disp: 90 tablet, Rfl: 0 .  clopidogrel (PLAVIX) 75 MG tablet, TAKE 1 TABLET EVERY DAY, Disp: 90 tablet, Rfl: 0 .  docusate sodium (COLACE) 100 MG capsule, Take 100 mg by mouth daily. Pt taking once a day with breakfast, Disp: , Rfl:  .  gabapentin (NEURONTIN) 300 MG capsule, Take 1 capsule (300 mg total) by mouth 3 (three) times daily., Disp: 270 capsule, Rfl: 1 .  glucose blood test strip, Use as instructed., Disp: 100 each, Rfl: 12 .  HYDROcodone-acetaminophen (NORCO) 7.5-325 MG tablet, Take 1 tablet by mouth 3 (three) times daily as needed., Disp: 90 tablet, Rfl: 0 .  metoprolol (LOPRESSOR) 50 MG tablet, TAKE 1 TABLET TWICE DAILY, Disp: 180 tablet, Rfl: 0 .  Multiple Vitamins-Minerals (HAIR SKIN AND NAILS FORMULA) TABS, Take by mouth. One tablet daily, Disp: , Rfl:  .  pantoprazole (PROTONIX) 40 MG tablet, TAKE 1 TABLET (40 MG TOTAL) BY MOUTH DAILY., Disp: 90 tablet, Rfl: 1 .  ranitidine (ZANTAC) 150 MG capsule, TAKE 1 CAPSULE (150 MG TOTAL) BY MOUTH AT BEDTIME., Disp: 90  capsule, Rfl: 1 .  simvastatin (ZOCOR) 40 MG tablet, TAKE 1 TABLET EVERY DAY, Disp: 90 tablet, Rfl: 0 .  VENTOLIN HFA 108 (90 BASE) MCG/ACT inhaler, Inhale 1-2 puffs into the lungs every 4 (four) hours as needed for wheezing or shortness of breath. , Disp: , Rfl:  .  Vitamin D, Ergocalciferol, (DRISDOL) 50000 UNITS CAPS capsule, , Disp: , Rfl:   Allergies  Allergen Reactions  . Fentanyl Other (See Comments)    urine retention  . Penicillins Itching and Rash     Review of Systems  Respiratory: Negative for shortness of breath.   Cardiovascular: Negative for palpitations.  Psychiatric/Behavioral: The patient is nervous/anxious. The patient does not have insomnia.      Objective  Vitals:   04/20/16 1443  BP: 122/62  Pulse: 63  Resp: (!) 92  Temp: 98.1 F (36.7 C)    Physical Exam  Constitutional: She is oriented to person, place, and time and well-developed, well-nourished, and in no distress.  Appears fatigued, sitting in wheelchair,   HENT:  Head: Normocephalic.  Cardiovascular: Normal rate, regular rhythm and normal heart sounds.   No murmur heard. Pulmonary/Chest: Effort normal and breath sounds normal.  Musculoskeletal:  Left Above-knee amputation.  Neurological: She is alert and oriented to person, place, and time.  Psychiatric: Mood, affect and judgment normal.  Nursing note and vitals reviewed.    Assessment & Plan  1. Anxiety Stable and responsive to alprazolam taken up to 3 times daily when needed, refills provided - ALPRAZolam (XANAX) 0.5 MG tablet; Take 1 tablet (0.5 mg total) by mouth 3 (three) times daily as needed for anxiety.  Dispense: 90 tablet; Refill: 0  2. Phantom limb syndrome with pain (HCC) Pain responsive to hydrocodone taken up to 3 times daily when needed. Refills provided - HYDROcodone-acetaminophen (NORCO) 7.5-325 MG tablet; Take 1 tablet by mouth 3 (three) times daily as needed.  Dispense: 90 tablet; Refill: 0   Leslye Puccini Asad A.  [facility] 04/20/2016 2:51 PM

## 2016-04-22 DIAGNOSIS — S31125A Laceration of abdominal wall with foreign body, periumbilic region without penetration into peritoneal cavity, initial encounter: Secondary | ICD-10-CM | POA: Diagnosis not present

## 2016-04-22 DIAGNOSIS — Z89612 Acquired absence of left leg above knee: Secondary | ICD-10-CM | POA: Diagnosis not present

## 2016-04-23 NOTE — Patient Outreach (Signed)
Error entry.   Rose M.   Pierzchala RN CCM [facility] Care Management  [phone]  

## 2016-05-10 ENCOUNTER — Other Ambulatory Visit: Payer: Self-pay | Admitting: *Deleted

## 2016-05-10 NOTE — Patient Outreach (Addendum)
[facility]) Care Management   05/10/2016  Amy [name] 1936-03-20 [id number]  Amy [name] is an 80 y.o. female  Subjective:  Spouse reports pt has cut down on her [employer], as  A result pt reports  Sleeping better at night.  Spouse reports pt had 2 near fall  misses transferring, one going  From chair to wheelchair, one going from wheelchair to bed. Spouse reports pt was hanging on  Wheelchair, the other time hanging half off the bed.  Spouse also reports pt still hallucinating,  One night went into the [place] in their garage, realized could not drive, was unable to get back In her wheelchair so scooted to the back door yelling to get back inside.   Spouse reports pt  Told him she was out there 2 hours.  Spouse reports story was correct because of the way  Car seat was positioned.    Spouse reports pt's sugar yesterday after eating was 144, had  Some homemade fudge.  Spouse reports pt's sister did come over to sit with pt, give him a  Break.   Objective:   Vitals:   05/10/16 1338  BP: (!) 114/52  Pulse: 60  Resp: 16    ROS  Physical Exam  Constitutional: She is oriented to person, place, and time. She appears well-developed and well-nourished.  Cardiovascular: Normal rate, regular rhythm and normal heart sounds.   Respiratory: Effort normal and breath sounds normal.  GI: Soft. Bowel sounds are normal.  Musculoskeletal: She exhibits edema.  Left AKA  Trace edema right ankle.   Neurological: She is alert and oriented to person, place, and time.  Skin: Skin is warm and dry.  Psychiatric: She has a normal mood and affect. Her behavior is normal. Judgment and thought content normal.    Encounter Medications:   Outpatient Encounter Prescriptions as of 05/10/2016  Medication Sig Note  . acetaminophen (TYLENOL) 500 MG tablet Take 500 mg by mouth every 6 (six) hours as needed. Pt takes 2 tablets as needed. 05/10/2016: As needed.   . ALPRAZolam  (XANAX) 0.5 MG tablet Take 1 tablet (0.5 mg total) by mouth 3 (three) times daily as needed for anxiety.   . citalopram (CELEXA) 20 MG tablet TAKE 1 TABLET AT BEDTIME   . clopidogrel (PLAVIX) 75 MG tablet TAKE 1 TABLET EVERY DAY   . docusate sodium (COLACE) 100 MG capsule Take 100 mg by mouth daily. Pt taking once a day with breakfast   . gabapentin (NEURONTIN) 300 MG capsule Take 1 capsule (300 mg total) by mouth 3 (three) times daily.   [name] glucose blood test strip Use as instructed.   [name] HYDROcodone-acetaminophen (NORCO) 7.5-325 MG tablet Take 1 tablet by mouth 3 (three) times daily as needed.   . metoprolol (LOPRESSOR) 50 MG tablet TAKE 1 TABLET TWICE DAILY   . Multiple Vitamins-Minerals (HAIR SKIN AND NAILS FORMULA) TABS Take by mouth. One tablet daily   . pantoprazole (PROTONIX) 40 MG tablet TAKE 1 TABLET (40 MG TOTAL) BY MOUTH DAILY.   [name] QUEtiapine (SEROQUEL) 25 MG tablet Take 25 mg by mouth at bedtime. Then increase to 2 qhs   . ranitidine (ZANTAC) 150 MG capsule TAKE 1 CAPSULE (150 MG TOTAL) BY MOUTH AT BEDTIME.   . simvastatin (ZOCOR) 40 MG tablet TAKE 1 TABLET EVERY DAY   . VENTOLIN HFA 108 (90 BASE) MCG/ACT inhaler Inhale 1-2 puffs into the lungs every 4 (four) hours as needed for wheezing or shortness  of breath.    . Vitamin D, Ergocalciferol, (DRISDOL) 50000 UNITS CAPS capsule  03/13/2015: Received from: External Pharmacy   No facility-administered encounter medications on file as of 05/10/2016.     Functional Status:   In your present state of health, do you have any difficulty performing the following activities: 03/31/2016 03/09/2016  Hearing? N Y  Vision? Y N  Difficulty concentrating or making decisions? [name]  Walking or climbing stairs? Y -  Dressing or bathing? Y -  Doing errands, shopping? Y -  Conservation officer, nature and eating ? - Y  Using the Toilet? - Y  In the past six months, have you accidently leaked urine? - Y  Do you have problems with loss of bowel control? - Y   Managing your Medications? - Y  Managing your Finances? - Y  Housekeeping or managing your Housekeeping? - Y  Some recent data might be hidden    Fall/Depression Screening:    PHQ 2/9 Scores 03/31/2016 03/09/2016 02/27/2016 01/21/2016 11/24/2015 10/27/2015 09/25/2015  PHQ - 2 Score 0 2 - 0 0 0 0  PHQ- 9 Score - 17 - - - - -  Exception Documentation - - Other- indicate reason in comment box - - - -  Not completed - - spoke with spouse - - - -    Assessment:  Pleasant 17 year old woman, resides with spouse (caregiver).  Lungs clear, no c/o pain, trace edema noted right ankle.  BP today 114/52.         Fall risk:  As reported by spouse, pt had 2 near misses.  Ongoing caution needed with           Transfers.         Reported hallucination: safety precaution discussed- back door securely locked.       Caregiver status:  Ongoing need to allow family to give him breaks.                        Plan:  As discussed with pt and spouse, plan to discharge from community nurse case management Services, goals met.            Plan to send Dr. Manuella Ghazi case closure letter.            As discussed with pt/spouse, case closure letter to be sent, includes [facility]'s main contact              Number to call if needs arise in the future.             Plan to inform [facility] care management assistant to close case.    [facility] CM Care Plan Problem Two   Flowsheet Row Most Recent Value  Care Plan Problem Two  Fall risk   Role Documenting the Problem Two  Care Management Coordinator  Care Plan for Problem Two  Active  Interventions for Problem Two Long Term Goal   Reinforced with pt use of caution transferring, ask for assitance if needed.   [facility] Long Term Goal (31-90) days  Pt would not have any falls within the next 40 days   [facility] Long Term Goal Start Date  04/09/16  [facility] Long Term Goal Met Date  05/10/16  [facility] CM Short Term Goal #2 (0-30 days)  Pt would switch to non caffeine drink for the next 30 days   [facility] CM Short Term Goal #2  Start Date  04/09/16  [facility] CM Short Term  Goal #2 Met Date  -- [not met-continues with caffeine drink, sleep better. ]  [facility] CM Short Term Goal #3 (0-30 days)  Pt would exercise daily for the next 30 days   [facility] CM Short Term Goal #3 Start Date  04/09/16  [facility] CM Short Term Goal #3 Met Date  -- [not met- per pt did not exercise daily ]     Zara Chess.   [facility]  [phone]

## 2016-05-11 ENCOUNTER — Encounter: Payer: Self-pay | Admitting: *Deleted

## 2016-05-13 ENCOUNTER — Ambulatory Visit: Payer: Commercial Managed Care - HMO | Admitting: Family Medicine

## 2016-05-19 ENCOUNTER — Encounter: Payer: Self-pay | Admitting: Family Medicine

## 2016-05-19 ENCOUNTER — Ambulatory Visit (INDEPENDENT_AMBULATORY_CARE_PROVIDER_SITE_OTHER): Payer: Commercial Managed Care - HMO | Admitting: Family Medicine

## 2016-05-19 DIAGNOSIS — G546 Phantom limb syndrome with pain: Secondary | ICD-10-CM

## 2016-05-19 DIAGNOSIS — F419 Anxiety disorder, unspecified: Secondary | ICD-10-CM | POA: Diagnosis not present

## 2016-05-19 MED ORDER — ALPRAZOLAM 0.5 MG PO TABS: 0.5000 mg | ORAL_TABLET | Freq: Three times a day (TID) | ORAL | 0 refills | Status: DC | PRN

## 2016-05-19 MED ORDER — HYDROCODONE-ACETAMINOPHEN 7.5-325 MG PO TABS: 1.0000 | ORAL_TABLET | Freq: Three times a day (TID) | ORAL | 0 refills | Status: DC | PRN

## 2016-05-19 NOTE — Progress Notes (Signed)
Name: Amy [name]   MRN: [id number]    DOB: May 23, 1936   Date:05/19/2016       Progress Note  Subjective  Chief Complaint  Chief Complaint  Patient presents with  . Follow-up    1 mo  . Medication Refill    xanax / hydrocodone    Anxiety  Presents for follow-up visit. Symptoms include excessive worry, insomnia, irritability and nervous/anxious behavior. Patient reports no panic. The severity of symptoms is moderate.     Phantom Limb Pain: Phantom limb pain since her left above kneeamputation in 2007. Pain feels the pain coming from her left leg (which has been amputated). Pain gets worse at night, doesn't really bother her during the day. She takes Hydrocodone-Acetaminophen 7.5-325 mg three times daily as needed and Gabapentin for relief.   Past Medical History:  Diagnosis Date  . Anxiety   . Bronchitis    hx of   . Cataract   . Chronic kidney disease   . Depression   . Diabetes mellitus without complication ([place])   . Dry cough   . 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 GERD (gastroesophageal reflux disease)   . History of frequent urinary tract infections   . Hyperlipidemia   . Hypertension   . Left renal mass   . Numbness and tingling    right hand   . Obesity   . Peripheral artery disease ([place])   . Urinary frequency   . Urinary incontinence     Past Surgical History:  Procedure Laterality Date  . ABDOMINAL HYSTERECTOMY  1978  . CARPAL TUNNEL RELEASE Bilateral   . CHOLECYSTECTOMY    . LEG AMPUTATION     above the knee / left   . ROBOT ASSISTED LAPAROSCOPIC NEPHRECTOMY Left 01/15/2015   Procedure: ROBOTIC ASSISTED LAPAROSCOPIC RADICAL NEPHRECTOMY;  Surgeon: Alexis Frock, MD;  Location: [facility];  Service: Urology;  Laterality: Left;  . stent placement right leg       Family History  Problem Relation Age of Onset  . Cancer Mother     Stomach  . Cancer Father   . Diabetes Brother   . Cancer Brother      Social History   Social History  . Marital status: Married    Spouse name: N/A  . Number of children: N/A  . Years of education: N/A   Occupational History  . Not on file.   Social History Main Topics  . Smoking status: Former Smoker    Packs/day: 1.50    Years: 51.00    Types: Cigarettes    Quit date: 05/24/2004  . Smokeless tobacco: Never Used  . Alcohol use No  . Drug use: No  . Sexual activity: Not Currently   Other Topics Concern  . Not on file   Social History Narrative  . No narrative on file     Current Outpatient Prescriptions:  .  acetaminophen (TYLENOL) 500 MG tablet, Take 500 mg by mouth every 6 (six) hours as needed. Pt takes 2 tablets as needed., Disp: , Rfl:  .  ALPRAZolam (XANAX) 0.5 MG tablet, Take 1 tablet (0.5 mg total) by mouth 3 (three) times daily as needed for anxiety., Disp: 90 tablet, Rfl: 0 .  citalopram (CELEXA) 20 MG tablet, TAKE 1 TABLET AT BEDTIME, Disp: 90 tablet, Rfl: 0 .  clopidogrel (PLAVIX) 75 MG tablet, TAKE 1 TABLET EVERY DAY, Disp: 90 tablet, Rfl:  0 .  docusate sodium (COLACE) 100 MG capsule, Take 100 mg by mouth daily. Pt taking once a day with breakfast, Disp: , Rfl:  .  gabapentin (NEURONTIN) 300 MG capsule, Take 1 capsule (300 mg total) by mouth 3 (three) times daily., Disp: 270 capsule, Rfl: 1 .  glucose blood test strip, Use as instructed., Disp: 100 each, Rfl: 12 .  HYDROcodone-acetaminophen (NORCO) 7.5-325 MG tablet, Take 1 tablet by mouth 3 (three) times daily as needed., Disp: 90 tablet, Rfl: 0 .  metoprolol (LOPRESSOR) 50 MG tablet, TAKE 1 TABLET TWICE DAILY, Disp: 180 tablet, Rfl: 0 .  Multiple Vitamins-Minerals (HAIR SKIN AND NAILS FORMULA) TABS, Take by mouth. One tablet daily, Disp: , Rfl:  .  pantoprazole (PROTONIX) 40 MG tablet, TAKE 1 TABLET (40 MG TOTAL) BY MOUTH DAILY., Disp: 90 tablet, Rfl: 1 .  QUEtiapine (SEROQUEL) 25 MG tablet, Take 25 mg by mouth at bedtime. Then increase to 2 qhs, Disp: , Rfl:  .   ranitidine (ZANTAC) 150 MG capsule, TAKE 1 CAPSULE (150 MG TOTAL) BY MOUTH AT BEDTIME., Disp: 90 capsule, Rfl: 1 .  simvastatin (ZOCOR) 40 MG tablet, TAKE 1 TABLET EVERY DAY, Disp: 90 tablet, Rfl: 0 .  VENTOLIN HFA 108 (90 BASE) MCG/ACT inhaler, Inhale 1-2 puffs into the lungs every 4 (four) hours as needed for wheezing or shortness of breath. , Disp: , Rfl:  .  Vitamin D, Ergocalciferol, (DRISDOL) 50000 UNITS CAPS capsule, , Disp: , Rfl:   Allergies  Allergen Reactions  . Fentanyl Other (See Comments)    urine retention  . Penicillins Itching and Rash     Review of Systems  Constitutional: Positive for irritability.  Psychiatric/Behavioral: The patient is nervous/anxious and has insomnia.      Objective  Vitals:   05/19/16 1602  BP: 118/72  Pulse: 60  Resp: 16  Temp: 98.6 F (37 C)  TempSrc: Oral  SpO2: 96%    Physical Exam  Constitutional: She is well-developed, well-nourished, and in no distress.  Sitting in wheelchair  HENT:  Head: Normocephalic.  Cardiovascular: Normal rate, regular rhythm and normal heart sounds.   No murmur heard. Pulmonary/Chest: Effort normal and breath sounds normal.  Musculoskeletal:  Left Above-knee amputation.  Neurological: She is alert.  Psychiatric: Mood, affect and judgment normal.  Nursing note and vitals reviewed.    Assessment & Plan  1. Anxiety Stable, taking alprazolam as prescribed, patient compliant with controlled substances agreement. Refills provided - ALPRAZolam (XANAX) 0.5 MG tablet; Take 1 tablet (0.5 mg total) by mouth 3 (three) times daily as needed for anxiety.  Dispense: 90 tablet; Refill: 0  2. Phantom limb syndrome with pain (HCC) Pain responsive to opioid therapy, patient compliant controlled substances agreement. Refills provided - HYDROcodone-acetaminophen (NORCO) 7.5-325 MG tablet; Take 1 tablet by mouth 3 (three) times daily as needed.  Dispense: 90 tablet; Refill: 0   Keegan Ducey [facility] 05/19/2016 4:08 PM

## 2016-05-23 DIAGNOSIS — S31125A Laceration of abdominal wall with foreign body, periumbilic region without penetration into peritoneal cavity, initial encounter: Secondary | ICD-10-CM | POA: Diagnosis not present

## 2016-05-23 DIAGNOSIS — Z89612 Acquired absence of left leg above knee: Secondary | ICD-10-CM | POA: Diagnosis not present

## 2016-06-07 ENCOUNTER — Other Ambulatory Visit (INDEPENDENT_AMBULATORY_CARE_PROVIDER_SITE_OTHER): Payer: Self-pay | Admitting: Vascular Surgery

## 2016-06-07 DIAGNOSIS — I739 Peripheral vascular disease, unspecified: Secondary | ICD-10-CM

## 2016-06-08 ENCOUNTER — Encounter (INDEPENDENT_AMBULATORY_CARE_PROVIDER_SITE_OTHER): Payer: Self-pay | Admitting: Vascular Surgery

## 2016-06-08 ENCOUNTER — Ambulatory Visit (INDEPENDENT_AMBULATORY_CARE_PROVIDER_SITE_OTHER): Payer: Medicare HMO | Admitting: Vascular Surgery

## 2016-06-08 ENCOUNTER — Ambulatory Visit (INDEPENDENT_AMBULATORY_CARE_PROVIDER_SITE_OTHER): Payer: Medicare HMO

## 2016-06-08 VITALS — BP 162/75 | HR 66 | Resp 16

## 2016-06-08 DIAGNOSIS — I10 Essential (primary) hypertension: Secondary | ICD-10-CM

## 2016-06-08 DIAGNOSIS — I739 Peripheral vascular disease, unspecified: Secondary | ICD-10-CM

## 2016-06-08 DIAGNOSIS — N183 Chronic kidney disease, stage 3 unspecified: Secondary | ICD-10-CM

## 2016-06-08 DIAGNOSIS — N184 Chronic kidney disease, stage 4 (severe): Secondary | ICD-10-CM | POA: Diagnosis not present

## 2016-06-08 DIAGNOSIS — Z89612 Acquired absence of left leg above knee: Secondary | ICD-10-CM | POA: Insufficient documentation

## 2016-06-08 DIAGNOSIS — E1122 Type 2 diabetes mellitus with diabetic chronic kidney disease: Secondary | ICD-10-CM | POA: Diagnosis not present

## 2016-06-08 NOTE — Assessment & Plan Note (Signed)
About 10 years ago. Stable and well healed. With her cognitive decline she is no longer able to use her prosthesis.

## 2016-06-08 NOTE — Assessment & Plan Note (Signed)
blood glucose control important in reducing the progression of atherosclerotic disease. Also, involved in wound healing. On appropriate medications.  

## 2016-06-08 NOTE — Assessment & Plan Note (Signed)
Her right ABI is stable to somewhat improved at 0.59 and she has a long-standing left above-the-knee amputation which is well-healed. She has no new limb threatening symptoms. Plan return in 6 months with ABIs due to known significant arterial insufficiency. No intervention necessary without limb threatening symptoms or complications at this point.

## 2016-06-08 NOTE — Progress Notes (Signed)
MRN : [id number]  Amy [name] is a 81 y.o. (14-Oct-1935) female who presents with chief complaint of  Chief Complaint  Patient presents with  . Follow-up  .  History of Present Illness: Patient returns today in follow up of Her peripheral arterial disease. She has developed some dementia and is more confused today than on previous visits. She has no new ulceration or infection. Her right ABI is stable to somewhat improved at 0.59 and she has a long-standing left above-the-knee amputation which is well-healed.  Current Outpatient Prescriptions  Medication Sig Dispense Refill  . acetaminophen (TYLENOL) 500 MG tablet Take 500 mg by mouth every 6 (six) hours as needed. Pt takes 2 tablets as needed.    . ALPRAZolam (XANAX) 0.5 MG tablet Take 1 tablet (0.5 mg total) by mouth 3 (three) times daily as needed for anxiety. 90 tablet 0  . citalopram (CELEXA) 20 MG tablet TAKE 1 TABLET AT BEDTIME 90 tablet 0  . clopidogrel (PLAVIX) 75 MG tablet TAKE 1 TABLET EVERY DAY 90 tablet 0  . docusate sodium (COLACE) 100 MG capsule Take 100 mg by mouth daily. Pt taking once a day with breakfast    . gabapentin (NEURONTIN) 300 MG capsule Take 1 capsule (300 mg total) by mouth 3 (three) times daily. 270 capsule 1  . glucose blood test strip Use as instructed. 100 each 12  . HYDROcodone-acetaminophen (NORCO) 7.5-325 MG tablet Take 1 tablet by mouth 3 (three) times daily as needed. 90 tablet 0  . metoprolol (LOPRESSOR) 50 MG tablet TAKE 1 TABLET TWICE DAILY 180 tablet 0  . Multiple Vitamins-Minerals (HAIR SKIN AND NAILS FORMULA) TABS Take by mouth. One tablet daily    . pantoprazole (PROTONIX) 40 MG tablet TAKE 1 TABLET (40 MG TOTAL) BY MOUTH DAILY. 90 tablet 1  . QUEtiapine (SEROQUEL) 25 MG tablet Take 25 mg by mouth at bedtime. Then increase to 2 qhs    . ranitidine (ZANTAC) 150 MG capsule TAKE 1 CAPSULE (150 MG TOTAL) BY MOUTH AT BEDTIME. 90 capsule 1  . simvastatin (ZOCOR) 40 MG tablet TAKE 1 TABLET  EVERY DAY 90 tablet 0  . VENTOLIN HFA 108 (90 BASE) MCG/ACT inhaler Inhale 1-2 puffs into the lungs every 4 (four) hours as needed for wheezing or shortness of breath.     . Vitamin D, Ergocalciferol, (DRISDOL) 50000 UNITS CAPS capsule      No current facility-administered medications for this visit.     Past Medical History:  Diagnosis Date  . Anxiety   . Bronchitis    hx of   . Cataract   . Chronic kidney disease   . Depression   . Diabetes mellitus without complication ([place])   . Dry cough   . 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 GERD (gastroesophageal reflux disease)   . History of frequent urinary tract infections   . Hyperlipidemia   . Hypertension   . Left renal mass   . Numbness and tingling    right hand   . Obesity   . Peripheral artery disease ([place])   . Urinary frequency   . Urinary incontinence     Past Surgical History:  Procedure Laterality Date  . ABDOMINAL HYSTERECTOMY  1978  . CARPAL TUNNEL RELEASE Bilateral   . CHOLECYSTECTOMY    . LEG AMPUTATION     above the knee / left   . ROBOT ASSISTED LAPAROSCOPIC  NEPHRECTOMY Left 01/15/2015   Procedure: ROBOTIC ASSISTED LAPAROSCOPIC RADICAL NEPHRECTOMY;  Surgeon: Alexis Frock, MD;  Location: [facility];  Service: Urology;  Laterality: Left;  . stent placement right leg       Social History Social History  Substance Use Topics  . Smoking status: Former Smoker    Packs/day: 1.50    Years: 51.00    Types: Cigarettes    Quit date: 05/24/2004  . Smokeless tobacco: Never Used  . Alcohol use No    Family History Family History  Problem Relation Age of Onset  . Cancer Mother     Stomach  . Cancer Father   . Diabetes Brother   . Cancer Brother     Allergies  Allergen Reactions  . Fentanyl Other (See Comments)    urine retention  . Penicillins Itching and Rash     REVIEW OF SYSTEMS (Negative unless checked)  Constitutional: _0 Weight loss  _1 Fever   _2 Chills Cardiac: _3 Chest pain   _4 Chest pressure   _5 Palpitations   _6 Shortness of breath when laying flat   _7 Shortness of breath at rest   _8 Shortness of breath with exertion. Vascular:  _9 Pain in legs with walking   _10 Pain in legs at rest   _11 Pain in legs when laying flat   _12 Claudication   _13 Pain in feet when walking  _14 Pain in feet at rest  _15 Pain in feet when laying flat   _16 History of DVT   _17 Phlebitis   _18 Swelling in legs   _19 Varicose veins   _20 Non-healing ulcers Pulmonary:   _21 Uses home oxygen   _22 Productive cough   _23 Hemoptysis   _24 Wheeze  _25 COPD   _26 Asthma Neurologic:  _27 Dizziness  _28 Blackouts   _29 Seizures   _30 History of stroke   _31 History of TIA  _32 Aphasia   _33 Temporary blindness   _34 Dysphagia   _35 Weakness or numbness in arms   _36 Weakness or numbness in legs Musculoskeletal:  _37 Arthritis   _38 Joint swelling   _39 Joint pain   _40 Low back pain Hematologic:  _41 Easy bruising  _42 Easy bleeding   _43 Hypercoagulable state   _44 Anemic   Gastrointestinal:  _45 Blood in stool   _46 Vomiting blood  _47 Gastroesophageal reflux/heartburn   _48 Abdominal pain Genitourinary:  _49 Chronic kidney disease   _50 Difficult urination  _51 Frequent urination  _52 Burning with urination   _53 Hematuria Skin:  _54 Rashes   _55 Ulcers   _56 Wounds Psychological:  _57 History of anxiety   _58  History of major depression.  Physical Examination  BP (!) 162/75   Pulse 66   Resp 16  Gen:  WD/WN, NAD Head: [place]/AT, No temporalis wasting. Ear/Nose/Throat: Hearing grossly intact, nares w/o erythema or drainage, trachea midline Eyes: Conjunctiva clear. Sclera non-icteric Neck: Supple.  No JVD.  Pulmonary:  Good air movement, no use of accessory muscles.  Cardiac: RRR, normal S1, S2 Vascular:  Vessel Right Left  Radial Palpable Palpable  Ulnar Palpable Palpable  Brachial Palpable Palpable  Carotid Palpable, without bruit Palpable, without bruit  Aorta Not palpable N/A  Femoral Palpable Palpable  Popliteal Not Palpable Not Palpable  PT 1+  Palpable Not Palpable  DP Not Palpable Not Palpable   Gastrointestinal: soft, non-tender/non-distended. No guarding/reflex.  Musculoskeletal: Uses a wheelchair. Left above-knee amputation Neurologic: Sensation grossly intact in extremities.  Symmetrical.  Speech is fluent.  Psychiatric: Patient appears pleasantly confused Dermatologic: No rashes or ulcers noted.  No cellulitis or open wounds. Lymph : No Cervical, Axillary, or Inguinal lymphadenopathy.      Labs Recent Results (from the past 2160 hour(s))  CBC with Differential  Status: Abnormal   Collection Time: 03/31/16  4:09 PM  Result Value Ref Range   WBC 9.3 3.8 - 10.8 K/uL   RBC 3.81 3.80 - 5.10 MIL/uL   Hemoglobin 11.0 (L) 11.7 - 15.5 g/dL   HCT 34.6 (L) 35.0 - 45.0 %   MCV 90.8 80.0 - 100.0 fL   MCH 28.9 27.0 - 33.0 pg   MCHC 31.8 (L) 32.0 - 36.0 g/dL   RDW 15.4 (H) 11.0 - 15.0 %   Platelets 242 140 - 400 K/uL   MPV 10.3 7.5 - 12.5 fL   Neutro Abs 5,766 1,500 - 7,800 cells/uL   Lymphs Abs 2,046 850 - 3,900 cells/uL   Monocytes Absolute 651 200 - 950 cells/uL   Eosinophils Absolute 744 (H) 15 - 500 cells/uL   Basophils Absolute 93 0 - 200 cells/uL   Neutrophils Relative % 62 %   Lymphocytes Relative 22 %   Monocytes Relative 7 %   Eosinophils Relative 8 %   Basophils Relative 1 %   Smear Review Criteria for review not met   B12     Status: None   Collection Time: 03/31/16  4:09 PM  Result Value Ref Range   Vitamin B-12 456 200 - 1,100 pg/mL  TSH     Status: None   Collection Time: 03/31/16  4:09 PM  Result Value Ref Range   TSH 2.28 mIU/L    Comment:   Reference Range   > or = 20 Years  0.40-4.50   Pregnancy Range First trimester  0.26-2.66 Second trimester 0.55-2.73 Third trimester  0.43-2.91     Drug Abuse Panel 10-50, U     Status: Abnormal   Collection Time: 03/31/16  4:09 PM  Result Value Ref Range   PLEASE NOTE: See Below     Comment:      * These results are for medical treatment only.   *    * Analysis was performed as non-forensic testing. *      AMPHETAMINES (1000 ng/mL SCRN) NEGATIVE    BARBITURATES NEGATIVE    BENZODIAZEPINES POSITIVE (A)    COCAINE METABOLITES NEGATIVE    MARIJUANA MET (50 ng/mL SCRN) NEGATIVE    METHADONE NEGATIVE    METHAQUALONE NEGATIVE    OPIATES POSITIVE (A)    MORPHINE NEGATIVE CONFIRMED    CODEINE NEGATIVE CONFIRMED    HYDROMORPHONE POSITIVE (A)    HYDROCODONE POSITIVE (A)    PHENCYCLIDINE NEGATIVE    PROPOXYPHENE NEGATIVE    See Note:       Comment:                                                    THE SUBMITTED URINE SPECIMEN WAS TESTED AT THE LISTED CUTOFFS AND  CONFIRMED BY A SECOND INDEPENDENT CHEMICAL METHOD.     DRUG CLASS            INITIAL TEST                             LEVEL    AMPHETAMINES             1000 ng/mL   BARBITURATES              300 ng/mL  BENZODIAZEPINES  300 ng/mL  COCAINE METABOLITES       300 ng/mL  MARIJUANA METABOLITES      50 ng/mL  METHADONE                 300 ng/mL  METHAQUALONE              300 ng/mL  OPIATES                   300 ng/mL  PHENCYCLIDINE              25 ng/mL  PROPOXYPHENE              300 ng/mL     This drug testing is for medical treatment only. Analysis was performed as non-forensic testing and these results should be used only by healthcare providers to render diagnosis or treatment, or to monitor progress of medical conditions.   For assistance with interpreting these drug results, please contact a [facility] Specialist: [phone] [place] [phone]), M-F, 8am-6pm EST.       Radiology No results found.  Assessment/Plan  Diabetes mellitus type 2, controlled blood glucose control important in reducing the progression of atherosclerotic disease. Also, involved in wound healing. On appropriate medications.   BP (high blood pressure) blood pressure control important in reducing the progression of atherosclerotic disease. On  appropriate oral medications.   S/P AKA (above knee amputation) unilateral, left ([place]) About 10 years ago. Stable and well healed. With her cognitive decline she is no longer able to use her prosthesis.  PVD (peripheral vascular disease) ([place]) Her right ABI is stable to somewhat improved at 0.59 and she has a long-standing left above-the-knee amputation which is well-healed. She has no new limb threatening symptoms. Plan return in 6 months with ABIs due to known significant arterial insufficiency. No intervention necessary without limb threatening symptoms or complications at this point.    Leotis Pain, MD  06/08/2016 12:06 PM    This note was created with Dragon medical transcription system.  Any errors from dictation are purely unintentional

## 2016-06-08 NOTE — Assessment & Plan Note (Signed)
blood pressure control important in reducing the progression of atherosclerotic disease. On appropriate oral medications.  

## 2016-06-14 ENCOUNTER — Other Ambulatory Visit: Payer: Self-pay | Admitting: Nurse Practitioner

## 2016-06-14 DIAGNOSIS — G546 Phantom limb syndrome with pain: Secondary | ICD-10-CM | POA: Diagnosis not present

## 2016-06-14 DIAGNOSIS — R413 Other amnesia: Secondary | ICD-10-CM

## 2016-06-14 DIAGNOSIS — Z8659 Personal history of other mental and behavioral disorders: Secondary | ICD-10-CM | POA: Diagnosis not present

## 2016-06-14 DIAGNOSIS — G301 Alzheimer's disease with late onset: Secondary | ICD-10-CM | POA: Diagnosis not present

## 2016-06-14 DIAGNOSIS — F0281 Dementia in other diseases classified elsewhere with behavioral disturbance: Secondary | ICD-10-CM | POA: Diagnosis not present

## 2016-06-14 DIAGNOSIS — F411 Generalized anxiety disorder: Secondary | ICD-10-CM | POA: Diagnosis not present

## 2016-06-15 ENCOUNTER — Encounter: Payer: Self-pay | Admitting: Family Medicine

## 2016-06-15 ENCOUNTER — Ambulatory Visit (INDEPENDENT_AMBULATORY_CARE_PROVIDER_SITE_OTHER): Payer: Commercial Managed Care - HMO | Admitting: Family Medicine

## 2016-06-15 VITALS — BP 150/71 | HR 65 | Temp 98.2°F | Resp 16

## 2016-06-15 DIAGNOSIS — R809 Proteinuria, unspecified: Secondary | ICD-10-CM | POA: Diagnosis not present

## 2016-06-15 DIAGNOSIS — N184 Chronic kidney disease, stage 4 (severe): Secondary | ICD-10-CM | POA: Diagnosis not present

## 2016-06-15 DIAGNOSIS — G301 Alzheimer's disease with late onset: Secondary | ICD-10-CM

## 2016-06-15 DIAGNOSIS — I129 Hypertensive chronic kidney disease with stage 1 through stage 4 chronic kidney disease, or unspecified chronic kidney disease: Secondary | ICD-10-CM | POA: Diagnosis not present

## 2016-06-15 DIAGNOSIS — F0281 Dementia in other diseases classified elsewhere with behavioral disturbance: Secondary | ICD-10-CM | POA: Diagnosis not present

## 2016-06-15 DIAGNOSIS — G546 Phantom limb syndrome with pain: Secondary | ICD-10-CM | POA: Diagnosis not present

## 2016-06-15 DIAGNOSIS — N390 Urinary tract infection, site not specified: Secondary | ICD-10-CM | POA: Diagnosis not present

## 2016-06-15 DIAGNOSIS — N2581 Secondary hyperparathyroidism of renal origin: Secondary | ICD-10-CM | POA: Diagnosis not present

## 2016-06-15 LAB — CBC WITH DIFFERENTIAL/PLATELET
BASOS ABS: 89 {cells}/uL (ref 0–200)
Basophils Relative: 1 %
EOS ABS: 801 {cells}/uL — AB (ref 15–500)
Eosinophils Relative: 9 %
HCT: 34.5 % — ABNORMAL LOW (ref 35.0–45.0)
HEMOGLOBIN: 10.7 g/dL — AB (ref 11.7–15.5)
LYMPHS ABS: 1958 {cells}/uL (ref 850–3900)
Lymphocytes Relative: 22 %
MCH: 27.6 pg (ref 27.0–33.0)
MCHC: 31 g/dL — ABNORMAL LOW (ref 32.0–36.0)
MCV: 88.9 fL (ref 80.0–100.0)
MPV: 11 fL (ref 7.5–12.5)
Monocytes Absolute: 801 cells/uL (ref 200–950)
Monocytes Relative: 9 %
NEUTROS ABS: 5251 {cells}/uL (ref 1500–7800)
Neutrophils Relative %: 59 %
Platelets: 207 10*3/uL (ref 140–400)
RBC: 3.88 MIL/uL (ref 3.80–5.10)
RDW: 16.7 % — ABNORMAL HIGH (ref 11.0–15.0)
WBC: 8.9 10*3/uL (ref 3.8–10.8)

## 2016-06-15 MED ORDER — HYDROCODONE-ACETAMINOPHEN 7.5-325 MG PO TABS: 1.0000 | ORAL_TABLET | Freq: Three times a day (TID) | ORAL | 0 refills | Status: DC | PRN

## 2016-06-15 NOTE — Progress Notes (Signed)
Name: Amy [name]   MRN: [id number]    DOB: 09-20-1935   Date:06/15/2016       Progress Note  Subjective  Chief Complaint  Chief Complaint  Patient presents with  . Generalized Body Aches    x4 days  . Fatigue    HPI  Pt. is brought in by husband who reports that patient has worsening in mental status, she does not know where she is, resists attempts to put her to bed, cries and hollers out her mother's name in the middle of the night etc. She has been sleeping throughout most of the day for the last few days, in addition she has had formed auditory and visual hallucinations. She has been seeing Neurology who has started on Aricept and trying to taper her off Xanax, while starting her on Buspirone. Per husband, patient has been increasingly confused, calling for her mother, has been irritable (believes that her husband is trying to put her in a facility).  Upon review of notes from Neurology, patient has been scheduled for an MRI of brain for January 30th, 2018.  Past Medical History:  Diagnosis Date  . Anxiety   . Bronchitis    hx of   . Cataract   . Chronic kidney disease   . Depression   . Diabetes mellitus without complication ([place])   . Dry cough   . 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 GERD (gastroesophageal reflux disease)   . History of frequent urinary tract infections   . Hyperlipidemia   . Hypertension   . Left renal mass   . Numbness and tingling    right hand   . Obesity   . Peripheral artery disease ([place])   . Urinary frequency   . Urinary incontinence     Past Surgical History:  Procedure Laterality Date  . ABDOMINAL HYSTERECTOMY  1978  . CARPAL TUNNEL RELEASE Bilateral   . CHOLECYSTECTOMY    . LEG AMPUTATION     above the knee / left   . ROBOT ASSISTED LAPAROSCOPIC NEPHRECTOMY Left 01/15/2015   Procedure: ROBOTIC ASSISTED LAPAROSCOPIC RADICAL NEPHRECTOMY;  Surgeon: Alexis Frock, MD;  Location: [facility];  Service: Urology;  Laterality: Left;  . stent placement right leg       Family History  Problem Relation Age of Onset  . Cancer Mother     Stomach  . Cancer Father   . Diabetes Brother   . Cancer Brother     Social History   Social History  . Marital status: Married    Spouse name: N/A  . Number of children: N/A  . Years of education: N/A   Occupational History  . Not on file.   Social History Main Topics  . Smoking status: Former Smoker    Packs/day: 1.50    Years: 51.00    Types: Cigarettes    Quit date: 05/24/2004  . Smokeless tobacco: Never Used  . Alcohol use No  . Drug use: No  . Sexual activity: Not Currently   Other Topics Concern  . Not on file   Social History Narrative  . No narrative on file     Current Outpatient Prescriptions:  .  acetaminophen (TYLENOL) 500 MG tablet, Take 500 mg by mouth every 6 (six) hours as needed. Pt takes 2 tablets as needed., Disp: , Rfl:  .  ALPRAZolam (XANAX) 0.5 MG tablet, Take 1 tablet (0.5  mg total) by mouth 3 (three) times daily as needed for anxiety., Disp: 90 tablet, Rfl: 0 .  busPIRone (BUSPAR) 10 MG tablet, Take 10 mg by mouth 2 (two) times daily., Disp: , Rfl:  .  citalopram (CELEXA) 20 MG tablet, TAKE 1 TABLET AT BEDTIME, Disp: 90 tablet, Rfl: 0 .  clopidogrel (PLAVIX) 75 MG tablet, TAKE 1 TABLET EVERY DAY, Disp: 90 tablet, Rfl: 0 .  docusate sodium (COLACE) 100 MG capsule, Take 100 mg by mouth daily. Pt taking once a day with breakfast, Disp: , Rfl:  .  gabapentin (NEURONTIN) 300 MG capsule, Take 1 capsule (300 mg total) by mouth 3 (three) times daily., Disp: 270 capsule, Rfl: 1 .  glucose blood test strip, Use as instructed., Disp: 100 each, Rfl: 12 .  HYDROcodone-acetaminophen (NORCO) 7.5-325 MG tablet, Take 1 tablet by mouth 3 (three) times daily as needed., Disp: 90 tablet, Rfl: 0 .  metoprolol (LOPRESSOR) 50 MG tablet, TAKE 1 TABLET TWICE DAILY, Disp: 180 tablet, Rfl: 0 .  Multiple Vitamins-Minerals  (HAIR SKIN AND NAILS FORMULA) TABS, Take by mouth. One tablet daily, Disp: , Rfl:  .  pantoprazole (PROTONIX) 40 MG tablet, TAKE 1 TABLET (40 MG TOTAL) BY MOUTH DAILY., Disp: 90 tablet, Rfl: 1 .  QUEtiapine (SEROQUEL) 25 MG tablet, Take 25 mg by mouth at bedtime. Then increase to 2 qhs, Disp: , Rfl:  .  ranitidine (ZANTAC) 150 MG capsule, TAKE 1 CAPSULE (150 MG TOTAL) BY MOUTH AT BEDTIME., Disp: 90 capsule, Rfl: 1 .  simvastatin (ZOCOR) 40 MG tablet, TAKE 1 TABLET EVERY DAY, Disp: 90 tablet, Rfl: 0 .  VENTOLIN HFA 108 (90 BASE) MCG/ACT inhaler, Inhale 1-2 puffs into the lungs every 4 (four) hours as needed for wheezing or shortness of breath. , Disp: , Rfl:  .  Vitamin D, Ergocalciferol, (DRISDOL) 50000 UNITS CAPS capsule, , Disp: , Rfl:  .  [START ON 06/28/2016] donepezil (ARICEPT) 5 MG tablet, Take 5 mg by mouth at bedtime., Disp: , Rfl:  .  QUEtiapine (SEROQUEL) 50 MG tablet, Take 50 mg by mouth., Disp: , Rfl:   Allergies  Allergen Reactions  . Fentanyl Other (See Comments)    urine retention  . Penicillins Itching and Rash     Review of Systems  Constitutional: Negative for chills and fever.  Eyes: Negative for blurred vision and double vision.  Gastrointestinal: Negative for abdominal pain and blood in stool.  Genitourinary: Negative for dysuria and hematuria.  Neurological: Negative for dizziness and headaches.  Psychiatric/Behavioral: The patient is nervous/anxious.      Objective  Vitals:   06/15/16 1320  BP: (!) 150/71  Pulse: 65  Resp: 16  Temp: 98.2 F (36.8 C)  TempSrc: Oral  SpO2: 94%    Physical Exam  Constitutional: She is oriented to person, place, and time and well-developed, well-nourished, and in no distress. She appears healthy. No distress.  Elevated blood pressure Pt. Intermittently communicative during the office visit, most of the time, she is silent sitting with her head down over the wheelchair.   Cardiovascular: Normal rate, regular rhythm, S1  normal, S2 normal and normal heart sounds.   No murmur heard. Pulmonary/Chest: Breath sounds normal. She has no wheezes.  Abdominal: Soft. Bowel sounds are normal. There is no tenderness.  Musculoskeletal:       Right ankle: She exhibits swelling.  Left Above knee amputation.  Neurological: She is oriented to person, place, and time.  Psychiatric: Mood, memory, affect and judgment normal.  Nursing note and vitals reviewed.      Assessment & Plan  1. Late onset Alzheimer's disease with behavioral disturbance I suspect her symptoms are due to worsening of Alzheimer's, she is being managed by neurology and recently was started on Aricept. Differential also includes withdrawal symptoms from discontinuation of benzodiazepines (patient is being tapered off alprazolam). We'll obtain lab work to rule out any organic etiologies. - CBC with Differential - COMPLETE METABOLIC PANEL WITH GFR - TSH - Urine Culture - Urinalysis, Routine w reflex microscopic  2. Phantom limb syndrome with pain (HCC) Stable and responsive to hydrocortisone taken up to 3 times daily when needed, refills provided. Patient compliant with controlled substances agreement - HYDROcodone-acetaminophen (NORCO) 7.5-325 MG tablet; Take 1 tablet by mouth 3 (three) times daily as needed.  Dispense: 90 tablet; Refill: 0   Dillan Candela Asad A. [facility] 06/15/2016 1:53 PM

## 2016-06-16 LAB — COMPLETE METABOLIC PANEL WITH GFR
ALBUMIN: 3.5 g/dL — AB (ref 3.6–5.1)
ALK PHOS: 85 U/L (ref 33–130)
ALT: 8 U/L (ref 6–29)
AST: 14 U/L (ref 10–35)
BILIRUBIN TOTAL: 0.7 mg/dL (ref 0.2–1.2)
BUN: 26 mg/dL — ABNORMAL HIGH (ref 7–25)
CO2: 24 mmol/L (ref 20–31)
CREATININE: 1.86 mg/dL — AB (ref 0.60–0.93)
Calcium: 8.9 mg/dL (ref 8.6–10.4)
Chloride: 108 mmol/L (ref 98–110)
GFR, EST AFRICAN AMERICAN: 29 mL/min — AB (ref >=60)
GFR, EST NON AFRICAN AMERICAN: 26 mL/min — AB (ref >=60)
GLUCOSE: 90 mg/dL (ref 65–99)
Potassium: 4.9 mmol/L (ref 3.5–5.3)
SODIUM: 142 mmol/L (ref 135–146)
TOTAL PROTEIN: 6.1 g/dL (ref 6.1–8.1)

## 2016-06-16 LAB — URINALYSIS, ROUTINE W REFLEX MICROSCOPIC

## 2016-06-16 LAB — TSH: TSH: 2.96 mIU/L

## 2016-06-17 DIAGNOSIS — G301 Alzheimer's disease with late onset: Secondary | ICD-10-CM | POA: Diagnosis not present

## 2016-06-17 DIAGNOSIS — R5383 Other fatigue: Secondary | ICD-10-CM | POA: Diagnosis not present

## 2016-06-17 DIAGNOSIS — F0281 Dementia in other diseases classified elsewhere with behavioral disturbance: Secondary | ICD-10-CM | POA: Diagnosis not present

## 2016-06-18 ENCOUNTER — Ambulatory Visit: Payer: Commercial Managed Care - HMO | Admitting: Family Medicine

## 2016-06-19 LAB — URINE CULTURE

## 2016-06-21 ENCOUNTER — Other Ambulatory Visit: Payer: Self-pay | Admitting: Family Medicine

## 2016-06-21 DIAGNOSIS — N390 Urinary tract infection, site not specified: Secondary | ICD-10-CM

## 2016-06-21 MED ORDER — LEVOFLOXACIN 750 MG PO TABS: 750.0000 mg | ORAL_TABLET | ORAL | 0 refills | Status: DC

## 2016-06-22 ENCOUNTER — Ambulatory Visit
Admission: RE | Admit: 2016-06-22 | Discharge: 2016-06-22 | Disposition: A | Discharge status: Home / Self Care | Payer: Medicare HMO | Source: Ambulatory Visit | Attending: Nurse Practitioner | Admitting: Nurse Practitioner

## 2016-06-22 DIAGNOSIS — R413 Other amnesia: Secondary | ICD-10-CM

## 2016-06-23 ENCOUNTER — Encounter: Payer: Self-pay | Admitting: Family Medicine

## 2016-06-23 ENCOUNTER — Ambulatory Visit (INDEPENDENT_AMBULATORY_CARE_PROVIDER_SITE_OTHER): Payer: Commercial Managed Care - HMO | Admitting: Family Medicine

## 2016-06-23 VITALS — BP 139/70 | HR 63 | Temp 97.7°F | Resp 16

## 2016-06-23 DIAGNOSIS — Z89612 Acquired absence of left leg above knee: Secondary | ICD-10-CM | POA: Diagnosis not present

## 2016-06-23 DIAGNOSIS — D649 Anemia, unspecified: Secondary | ICD-10-CM | POA: Diagnosis not present

## 2016-06-23 DIAGNOSIS — S31125A Laceration of abdominal wall with foreign body, periumbilic region without penetration into peritoneal cavity, initial encounter: Secondary | ICD-10-CM | POA: Diagnosis not present

## 2016-06-23 LAB — RETICULOCYTES
ABS Retic: 93120 cells/uL — ABNORMAL HIGH (ref 20000–80000)
RBC.: 3.88 MIL/uL (ref 3.80–5.10)
RETIC CT PCT: 2.4 %

## 2016-06-23 NOTE — Progress Notes (Signed)
Name: Amy [name]   MRN: [id number]    DOB: [birth year]   Date:06/23/2016       Progress Note  Subjective  Chief Complaint  Chief Complaint  Patient presents with  . Follow-up    anemia    HPI  Anemia: Pt. Presents for work up of anemia, last hemoglobin was 10.7g/dL hematocrit was 34.5%, she has below normal hemoglobin and hematocrit for a long time, denies any symptoms including chest pain, palpitations, dyspnea, blood in urine or stools, or fatigue above her baseline.  Past Medical History:  Diagnosis Date  . Anxiety   . Bronchitis    hx of   . Cataract   . Chronic kidney disease   . Depression   . Diabetes mellitus without complication ([place])   . Dry cough   . 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 GERD (gastroesophageal reflux disease)   . History of frequent urinary tract infections   . Hyperlipidemia   . Hypertension   . Left renal mass   . Numbness and tingling    right hand   . Obesity   . Peripheral artery disease ([place])   . Urinary frequency   . Urinary incontinence     Past Surgical History:  Procedure Laterality Date  . ABDOMINAL HYSTERECTOMY  1978  . CARPAL TUNNEL RELEASE Bilateral   . CHOLECYSTECTOMY    . LEG AMPUTATION     above the knee / left   . ROBOT ASSISTED LAPAROSCOPIC NEPHRECTOMY Left 01/15/2015   Procedure: ROBOTIC ASSISTED LAPAROSCOPIC RADICAL NEPHRECTOMY;  Surgeon: Alexis Frock, MD;  Location: [facility];  Service: Urology;  Laterality: Left;  . stent placement right leg       Family History  Problem Relation Age of Onset  . Cancer Mother     Stomach  . Cancer Father   . Diabetes Brother   . Cancer Brother     Social History   Social History  . Marital status: Married    Spouse name: N/A  . Number of children: N/A  . Years of education: N/A   Occupational History  . Not on file.   Social History Main Topics  . Smoking status: Former Smoker    Packs/day: 1.50    Years:  51.00    Types: Cigarettes    Quit date: 05/24/2004  . Smokeless tobacco: Never Used  . Alcohol use No  . Drug use: No  . Sexual activity: Not Currently   Other Topics Concern  . Not on file   Social History Narrative  . No narrative on file     Current Outpatient Prescriptions:  .  acetaminophen (TYLENOL) 500 MG tablet, Take 500 mg by mouth every 6 (six) hours as needed. Pt takes 2 tablets as needed., Disp: , Rfl:  .  ALPRAZolam (XANAX) 0.5 MG tablet, Take 1 tablet (0.5 mg total) by mouth 3 (three) times daily as needed for anxiety., Disp: 90 tablet, Rfl: 0 .  busPIRone (BUSPAR) 10 MG tablet, Take 10 mg by mouth 2 (two) times daily., Disp: , Rfl:  .  citalopram (CELEXA) 20 MG tablet, TAKE 1 TABLET AT BEDTIME, Disp: 90 tablet, Rfl: 0 .  clopidogrel (PLAVIX) 75 MG tablet, TAKE 1 TABLET EVERY DAY, Disp: 90 tablet, Rfl: 0 .  docusate sodium (COLACE) 100 MG capsule, Take 100 mg by mouth daily. Pt taking once a day with breakfast, Disp: , Rfl:  .  [  START ON 06/28/2016] donepezil (ARICEPT) 5 MG tablet, Take 5 mg by mouth at bedtime., Disp: , Rfl:  .  gabapentin (NEURONTIN) 300 MG capsule, Take 1 capsule (300 mg total) by mouth 3 (three) times daily., Disp: 270 capsule, Rfl: 1 .  glucose blood test strip, Use as instructed., Disp: 100 each, Rfl: 12 .  HYDROcodone-acetaminophen (NORCO) 7.5-325 MG tablet, Take 1 tablet by mouth 3 (three) times daily as needed., Disp: 90 tablet, Rfl: 0 .  levofloxacin (LEVAQUIN) 750 MG tablet, Take 1 tablet (750 mg total) by mouth every other day., Disp: 5 tablet, Rfl: 0 .  metoprolol (LOPRESSOR) 50 MG tablet, TAKE 1 TABLET TWICE DAILY, Disp: 180 tablet, Rfl: 0 .  Multiple Vitamins-Minerals (HAIR SKIN AND NAILS FORMULA) TABS, Take by mouth. One tablet daily, Disp: , Rfl:  .  pantoprazole (PROTONIX) 40 MG tablet, TAKE 1 TABLET (40 MG TOTAL) BY MOUTH DAILY., Disp: 90 tablet, Rfl: 1 .  QUEtiapine (SEROQUEL) 25 MG tablet, Take 25 mg by mouth at bedtime. Then increase to  2 qhs, Disp: , Rfl:  .  QUEtiapine (SEROQUEL) 50 MG tablet, Take 50 mg by mouth., Disp: , Rfl:  .  ranitidine (ZANTAC) 150 MG capsule, TAKE 1 CAPSULE (150 MG TOTAL) BY MOUTH AT BEDTIME., Disp: 90 capsule, Rfl: 1 .  simvastatin (ZOCOR) 40 MG tablet, TAKE 1 TABLET EVERY DAY, Disp: 90 tablet, Rfl: 0 .  VENTOLIN HFA 108 (90 BASE) MCG/ACT inhaler, Inhale 1-2 puffs into the lungs every 4 (four) hours as needed for wheezing or shortness of breath. , Disp: , Rfl:  .  Vitamin D, Ergocalciferol, (DRISDOL) 50000 UNITS CAPS capsule, , Disp: , Rfl:   Allergies  Allergen Reactions  . Fentanyl Other (See Comments)    urine retention  . Penicillins Itching and Rash     ROS  Please see HPI for complete discussion of ROS.  Objective  Vitals:   06/23/16 1420  BP: 139/70  Pulse: 63  Resp: 16  Temp: 97.7 F (36.5 C)  TempSrc: Oral  SpO2: 93%    Physical Exam  Constitutional: She is oriented to person, place, and time and well-developed, well-nourished, and in no distress.  HENT:  Head: Normocephalic and atraumatic.  Cardiovascular: Normal rate, regular rhythm and normal heart sounds.   No murmur heard. Pulmonary/Chest: Effort normal and breath sounds normal. She has no wheezes.  Abdominal: Soft. Bowel sounds are normal. There is no tenderness.  Musculoskeletal:       Right ankle: She exhibits swelling. Tenderness.  Left above-knee amputation.  Neurological: She is alert and oriented to person, place, and time.  Psychiatric: Mood, memory, affect and judgment normal.  Nursing note and vitals reviewed.     Assessment & Plan  1. Anemia, unspecified type Obtained pertinent lab work to determine the type of anemia, differential includes anemia from iron deficiency versus anemia of chronic disease - Iron, TIBC and Ferritin Panel - Reticulocytes - Transferrin   Huzaifa Viney Asad A. [facility] 06/23/2016 2:32 PM

## 2016-06-24 ENCOUNTER — Other Ambulatory Visit: Payer: Self-pay | Admitting: Family Medicine

## 2016-06-24 DIAGNOSIS — N390 Urinary tract infection, site not specified: Secondary | ICD-10-CM

## 2016-06-24 DIAGNOSIS — B961 Klebsiella pneumoniae [K. pneumoniae] as the cause of diseases classified elsewhere: Principal | ICD-10-CM

## 2016-06-24 LAB — IRON,TIBC AND FERRITIN PANEL
%SAT: 9 % — AB (ref 11–50)
Ferritin: 13 ng/mL — ABNORMAL LOW (ref 20–288)
IRON: 30 ug/dL — AB (ref 45–160)
TIBC: 335 ug/dL (ref 250–450)

## 2016-06-24 LAB — TRANSFERRIN: Transferrin: 281 mg/dL (ref 188–341)

## 2016-06-24 MED ORDER — SULFAMETHOXAZOLE-TRIMETHOPRIM 400-80 MG PO TABS: 1.0000 | ORAL_TABLET | Freq: Every day | ORAL | 0 refills | Status: AC

## 2016-06-24 NOTE — Progress Notes (Unsigned)
Patient poorly has side effects from levofloxacin which was started for UTI, per her husband she has been shaking has been nauseated etc. He is wondering if it's a side effect of levofloxacin for the fact that patient is no longer on alprazolam and is now on buspirone as started by neurologist. I explained that it is most likely from buspirone, however will switch from levofloxacin to Bactrim to determine if her symptoms resolve. If her symptoms still continue despite being switched from levofloxacin to Bactrim, then they are most likely from BuSpar instead and he should contact the neurologist for a different medication. Patient's been verbalized understanding

## 2016-06-28 NOTE — Progress Notes (Signed)
Spoke to Amy [name] he stated she is doing better but she is still confused. He stated he has been in contact with Neurology.

## 2016-06-29 ENCOUNTER — Other Ambulatory Visit: Payer: Self-pay | Admitting: Family Medicine

## 2016-06-29 DIAGNOSIS — D649 Anemia, unspecified: Secondary | ICD-10-CM | POA: Insufficient documentation

## 2016-06-29 MED ORDER — FERROUS SULFATE 325 (65 FE) MG PO TABS: 325.0000 mg | ORAL_TABLET | Freq: Every day | ORAL | 2 refills | Status: DC

## 2016-07-05 ENCOUNTER — Other Ambulatory Visit: Payer: Self-pay | Admitting: Family Medicine

## 2016-07-05 ENCOUNTER — Telehealth: Payer: Self-pay | Admitting: Family Medicine

## 2016-07-05 DIAGNOSIS — G546 Phantom limb syndrome with pain: Secondary | ICD-10-CM

## 2016-07-05 NOTE — Telephone Encounter (Signed)
Requesting refill for gabapentin 300mg  please send to [facility]. Husband is also asking for a emergency supply to be sent to [facility]. Please return call once done

## 2016-07-06 ENCOUNTER — Other Ambulatory Visit: Payer: Self-pay | Admitting: Emergency Medicine

## 2016-07-06 DIAGNOSIS — G546 Phantom limb syndrome with pain: Secondary | ICD-10-CM

## 2016-07-06 MED ORDER — GABAPENTIN 300 MG PO CAPS: 300.0000 mg | ORAL_CAPSULE | Freq: Three times a day (TID) | ORAL | 1 refills | Status: DC

## 2016-07-06 NOTE — Telephone Encounter (Signed)
Pt will be completely out of gabapentin by this afternoon.

## 2016-07-14 ENCOUNTER — Inpatient Hospital Stay: Payer: Medicare HMO

## 2016-07-14 ENCOUNTER — Encounter: Payer: Self-pay | Admitting: Hematology and Oncology

## 2016-07-14 ENCOUNTER — Inpatient Hospital Stay: Payer: Medicare HMO | Attending: Hematology and Oncology | Admitting: Hematology and Oncology

## 2016-07-14 VITALS — BP 116/57 | HR 69 | Temp 97.9°F | Resp 18 | Ht 63.0 in | Wt 150.0 lb

## 2016-07-14 DIAGNOSIS — Z87440 Personal history of urinary (tract) infections: Secondary | ICD-10-CM

## 2016-07-14 DIAGNOSIS — D49519 Neoplasm of unspecified behavior of unspecified kidney: Secondary | ICD-10-CM

## 2016-07-14 DIAGNOSIS — R634 Abnormal weight loss: Secondary | ICD-10-CM | POA: Insufficient documentation

## 2016-07-14 DIAGNOSIS — D649 Anemia, unspecified: Secondary | ICD-10-CM

## 2016-07-14 DIAGNOSIS — R630 Anorexia: Secondary | ICD-10-CM | POA: Diagnosis not present

## 2016-07-14 DIAGNOSIS — E785 Hyperlipidemia, unspecified: Secondary | ICD-10-CM | POA: Diagnosis not present

## 2016-07-14 DIAGNOSIS — N189 Chronic kidney disease, unspecified: Secondary | ICD-10-CM

## 2016-07-14 DIAGNOSIS — D509 Iron deficiency anemia, unspecified: Secondary | ICD-10-CM

## 2016-07-14 DIAGNOSIS — I129 Hypertensive chronic kidney disease with stage 1 through stage 4 chronic kidney disease, or unspecified chronic kidney disease: Secondary | ICD-10-CM | POA: Insufficient documentation

## 2016-07-14 DIAGNOSIS — Z87891 Personal history of nicotine dependence: Secondary | ICD-10-CM | POA: Insufficient documentation

## 2016-07-14 DIAGNOSIS — K219 Gastro-esophageal reflux disease without esophagitis: Secondary | ICD-10-CM | POA: Diagnosis not present

## 2016-07-14 DIAGNOSIS — Z86718 Personal history of other venous thrombosis and embolism: Secondary | ICD-10-CM | POA: Diagnosis not present

## 2016-07-14 DIAGNOSIS — E1122 Type 2 diabetes mellitus with diabetic chronic kidney disease: Secondary | ICD-10-CM | POA: Diagnosis not present

## 2016-07-14 DIAGNOSIS — Z800 Family history of malignant neoplasm of digestive organs: Secondary | ICD-10-CM | POA: Diagnosis not present

## 2016-07-14 DIAGNOSIS — Z79899 Other long term (current) drug therapy: Secondary | ICD-10-CM | POA: Diagnosis not present

## 2016-07-14 DIAGNOSIS — Z89612 Acquired absence of left leg above knee: Secondary | ICD-10-CM

## 2016-07-14 DIAGNOSIS — Z85528 Personal history of other malignant neoplasm of kidney: Secondary | ICD-10-CM | POA: Insufficient documentation

## 2016-07-14 DIAGNOSIS — Z880 Allergy status to penicillin: Secondary | ICD-10-CM | POA: Diagnosis not present

## 2016-07-14 DIAGNOSIS — F0390 Unspecified dementia without behavioral disturbance: Secondary | ICD-10-CM | POA: Diagnosis not present

## 2016-07-14 DIAGNOSIS — Z905 Acquired absence of kidney: Secondary | ICD-10-CM | POA: Insufficient documentation

## 2016-07-14 LAB — CBC WITH DIFFERENTIAL/PLATELET
Basophils Absolute: 0.1 10*3/uL (ref 0–0.1)
Basophils Relative: 1 %
Eosinophils Absolute: 0.5 10*3/uL (ref 0–0.7)
Eosinophils Relative: 6 %
HCT: 35.5 % (ref 35.0–47.0)
Hemoglobin: 11.6 g/dL — ABNORMAL LOW (ref 12.0–16.0)
Lymphocytes Relative: 22 %
Lymphs Abs: 1.7 10*3/uL (ref 1.0–3.6)
MCH: 28.8 pg (ref 26.0–34.0)
MCHC: 32.8 g/dL (ref 32.0–36.0)
MCV: 87.6 fL (ref 80.0–100.0)
Monocytes Absolute: 0.6 10*3/uL (ref 0.2–0.9)
Monocytes Relative: 8 %
Neutro Abs: 5 10*3/uL (ref 1.4–6.5)
Neutrophils Relative %: 63 %
Platelets: 173 10*3/uL (ref 150–440)
RBC: 4.05 MIL/uL (ref 3.80–5.20)
RDW: 17.5 % — ABNORMAL HIGH (ref 11.5–14.5)
WBC: 7.9 10*3/uL (ref 3.6–11.0)

## 2016-07-14 LAB — FERRITIN: Ferritin: 22 ng/mL (ref 11–307)

## 2016-07-14 LAB — FOLATE: Folate: 9.8 ng/mL (ref >5.9)

## 2016-07-14 NOTE — Progress Notes (Signed)
[facility] day:  07/14/2016  Chief Complaint: ANAHLI ARVANITIS is a 81 y.o. female with anemia who is referred in consultation by Dr. Rochel Brome for assessment and management.  HPI:   The patient notes a history of anemia since she was a child.  Her blood has always been "low".  She denies any prior evaluation or treatment.  She has never required a transfusion.  She underwent robotic-assisted laparoscopic left radical nephrectomy on 01/09/2015 by Dr. Alexis Frock at [facility]. Pathology revealed a 6.3 cm renal cell carcinoma.  Tumor was predominantly clear cell with a minor component of type I papillary.  Fuhrman grade 3 of 4.  Tumor extended into the renal vein.  There was focal extension into the perirenal and sinus adipose tissue.  Margins were negative.  There was metastatic tumor in 2 of 2 lymph nodes.  Pathologic stage was T3aN1Mx.  She is followed by Dr. Juleen China.  She denies any new medications except for dementia medications.  She notes some black stool on oral iron. She had an EGD and colonoscopy by Dr. Vira Agar on 10/10/2013.  She has had reflux for the past 15-30 years.  She takes Prilosec.  Her diet is not very good. She does not eat very much.  She states that her pills make her not hungry.  She has lost an unspecified amount of weight due to her poor appetite. She denies any B symptoms. She always feels cold.  She has had mild anemia dating back to 10/05/2012.  Hematocrit has ranged between 34.5-35.3.  Hemogloblin has ranged between 10.7- 11.7 without trend.  CBC on 06/15/2016 revealed a hematocrit of 44.5, hemoglobin 10.7, MCV 88.9, platelets 207,000, and white count 8900 with an [place] of 5251.  Creatinine was 1.86 (CrCl 26 ml/min), albumen 3.5, protein 6.1, and calcium 8.9.  B12 was 456 on 03/31/2016.  Labs on 06/23/2016 revealed a ferritin of 13, iron saturation 9% and TIBC 335.  Reticulocyte count was 2.4%.  She was started on  ferrous sulfate 325 mg po q day on 06/29/2016.    She has a history of left lower extremity deep venous thrombosis.  She is s/p left AKA.   Past Medical History:  Diagnosis Date  . Anxiety   . Bilateral cataracts   . Bronchitis    hx of   . Cancer of kidney, left ([place]) 2017  . Cataract   . Chronic kidney disease   . Depression   . Diabetes mellitus without complication ([place])   . Dry cough   . 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 GERD (gastroesophageal reflux disease)   . History of frequent urinary tract infections   . Hyperlipidemia   . Hypertension   . Left renal mass   . Numbness and tingling    right hand   . Obesity   . Peripheral artery disease ([place])   . Urinary frequency   . Urinary incontinence     Past Surgical History:  Procedure Laterality Date  . ABDOMINAL HYSTERECTOMY  1978  . CARPAL TUNNEL RELEASE Bilateral   . CHOLECYSTECTOMY    . LEG AMPUTATION     above the knee / left   . ROBOT ASSISTED LAPAROSCOPIC NEPHRECTOMY Left 01/15/2015   Procedure: ROBOTIC ASSISTED LAPAROSCOPIC RADICAL NEPHRECTOMY;  Surgeon: Alexis Frock, MD;  Location: [facility];  Service: Urology;  Laterality: Left;  .  stent placement right leg       Family History  Problem Relation Age of Onset  . Cancer Mother     Stomach  . Cancer Father   . Diabetes Brother   . Cancer Brother     Social History:  reports that she quit smoking about 12 years ago. Her smoking use included Cigarettes. She has a 76.50 pack-year smoking history. She has never used smokeless tobacco. She reports that she does not drink alcohol or use drugs.  She lives in [place].  The patient is accompanied by her husband today.  Allergies:  Allergies  Allergen Reactions  . Fentanyl Other (See Comments)    urine retention  . Penicillins Itching and Rash    Current Medications: Current Outpatient Prescriptions  Medication Sig Dispense Refill  . busPIRone (BUSPAR) 10  MG tablet Take 10 mg by mouth 2 (two) times daily.    . citalopram (CELEXA) 20 MG tablet TAKE 1 TABLET AT BEDTIME 90 tablet 0  . clopidogrel (PLAVIX) 75 MG tablet TAKE 1 TABLET EVERY DAY 90 tablet 0  . docusate sodium (COLACE) 100 MG capsule Take 100 mg by mouth daily. Pt taking once a day with breakfast    . donepezil (ARICEPT) 5 MG tablet Take 5 mg by mouth at bedtime.    . ferrous sulfate 325 (65 FE) MG tablet Take 1 tablet (325 mg total) by mouth daily with breakfast. 30 tablet 2  . gabapentin (NEURONTIN) 300 MG capsule Take 1 capsule (300 mg total) by mouth 3 (three) times daily. 270 capsule 1  . glucose blood test strip Use as instructed. 100 each 12  . HYDROcodone-acetaminophen (NORCO) 7.5-325 MG tablet Take 1 tablet by mouth 3 (three) times daily as needed. 90 tablet 0  . metoprolol (LOPRESSOR) 50 MG tablet TAKE 1 TABLET TWICE DAILY 180 tablet 0  . Multiple Vitamins-Minerals (HAIR SKIN AND NAILS FORMULA) TABS Take by mouth. One tablet daily    . pantoprazole (PROTONIX) 40 MG tablet TAKE 1 TABLET (40 MG TOTAL) BY MOUTH DAILY. 90 tablet 1  . QUEtiapine (SEROQUEL) 50 MG tablet Take 50 mg by mouth.    . ranitidine (ZANTAC) 150 MG capsule TAKE 1 CAPSULE (150 MG TOTAL) BY MOUTH AT BEDTIME. 90 capsule 1  . simvastatin (ZOCOR) 40 MG tablet TAKE 1 TABLET EVERY DAY 90 tablet 0  . VENTOLIN HFA 108 (90 BASE) MCG/ACT inhaler Inhale 1-2 puffs into the lungs every 4 (four) hours as needed for wheezing or shortness of breath.     [name] acetaminophen (TYLENOL) 500 MG tablet Take 500 mg by mouth every 6 (six) hours as needed. Pt takes 2 tablets as needed.    . ALPRAZolam (XANAX) 0.5 MG tablet Take 1 tablet (0.5 mg total) by mouth 3 (three) times daily as needed for anxiety. (Patient not taking: Reported on 07/14/2016) 90 tablet 0  . QUEtiapine (SEROQUEL) 25 MG tablet Take 25 mg by mouth at bedtime. Then increase to 2 qhs    . Vitamin D, Ergocalciferol, (DRISDOL) 50000 UNITS CAPS capsule      No current  facility-administered medications for this visit.     Review of Systems:  GENERAL:  Feels "ok".  No fevers or sweats.  Feels cold.  Unspecified weight loss. PERFORMANCE STATUS (ECOG):  2-3 HEENT:  Runny nose.  Sore throat.  No visual changes, mouth sores or tenderness. Lungs: No shortness of breath or cough.  No hemoptysis. Cardiac:  No chest pain, palpitations, orthopnea, or PND. GI:  Black stool on iron.  No nausea, vomiting, diarrhea, constipation, or hematochezia. GU:  No urgency, frequency, dysuria, or hematuria. Musculoskeletal:  Left arm hurts on awakening.  Right 3rd finger hurts.  No back pain.  No joint pain.  No muscle tenderness. Extremities:  No pain or swelling. Skin:  No rashes or skin changes. Neuro:  No headache, numbness or weakness, balance or coordination issues. Endocrine:  No diabetes, thyroid issues, hot flashes or night sweats. Psych:  No mood changes, depression or anxiety. Pain:  No focal pain. Review of systems:  All other systems reviewed and found to be negative.  Physical Exam: Blood pressure (!) 116/57, pulse 69, temperature 97.9 F (36.6 C), temperature source Tympanic, resp. rate 18, height 5\' 3"  (1.6 m), weight 150 lb (68 kg). GENERAL:  Well developed, well nourished, woman sitting comfortably in a wheelchair in the exam room in no acute distress. MENTAL STATUS:  Alert and oriented to person, place and time. HEAD:  Graying hair.  Head tilt to the left.  Normocephalic, atraumatic, face symmetric, no Cushingoid features. EYES:  Glasses.  Brown eyes.  Pupils equal round and reactive to light and accomodation.  No conjunctivitis or scleral icterus. ENT:  Oropharynx clear without lesion.  Tongue normal. Mucous membranes moist.  RESPIRATORY:  Clear to auscultation without rales, wheezes or rhonchi. CARDIOVASCULAR:  Regular rate and rhythm without murmur, rub or gallop. ABDOMEN:  Soft, non-tender, with active bowel sounds, and no hepatosplenomegaly.  No  masses. SKIN:  No rashes, ulcers or lesions. EXTREMITIES: Left sided above the knee amputation.  No edema, no skin discoloration or tenderness.  No palpable cords. LYMPH NODES: No palpable cervical, supraclavicular, axillary or inguinal adenopathy  NEUROLOGICAL: Unremarkable. PSYCH:  Appropriate.  No visits with results within 3 Day(s) from this visit.  Latest known visit with results is:  Office Visit on 06/23/2016  Component Date Value Ref Range Status  . Ferritin 06/23/2016 13* 20 - 288 ng/mL Final  . Iron 06/23/2016 30* 45 - 160 ug/dL Final  . TIBC 06/23/2016 335  250 - 450 ug/dL Final  . %SAT 06/23/2016 9* 11 - 50 % Final  . Retic Ct Pct 06/23/2016 2.4  % Final  . RBC. 06/23/2016 3.88  3.80 - 5.10 MIL/uL Final  . ABS Retic 06/23/2016 93120* 20,000 - 80,000 cells/uL Final  . Transferrin 06/23/2016 281  188 - 341 mg/dL Final    Assessment:  VARA MAIRENA is a 81 y.o. female with mild iron deficiency anemia.  EGD and colonoscopy on 10/10/2013 were negative per patient.  She has had reflux for the past 15-30 years.  She is on Prilosec.  Diet is not good.  She denies any pica.  She has a history of stage III renal cell carcinoma s/p robotic-assisted laparoscopic left radical nephrectomy on 01/09/2015. Pathology revealed a 6.3 cm renal cell carcinoma.  Tumor was predominantly clear cell with a minor component of type I papillary.  Fuhrman grade 3 of 4.  Tumor extended into the renal vein.  There was focal extension into the perirenal and sinus adipose tissue.  Margins were negative.  There was metastatic tumor in 2 of 2 lymph nodes.  Pathologic stage was T3aN1Mx.    Abdomen and pelvic CT scan on 11/29/2014 revealed a 5.5 x 3.7 solid enhancing mass arising from the upper pole of the left kidney.  There was evidence of renal vein invasion.  There were borderline enlarged periaortic lymph node (9 mm).   She has chronic  renal insufficiency.  Creatinine was 1.86 (CrCl 26 ml/min) on  06/15/2016.  She has a history of left lower etxremity DVT.  She is s/p left AKA.  Symptomatically, she has lost some weight.  Appetite is poor.  Plan: 1.  Discuss anemia.  Current labs are consistent with iron deficiency anemia.  Discuss few additional labs. 2.  Obtain information on renal cell carcinoma.  Ensure adequate follow-up:  H&P every 3-6 months x 3 years then annually up to 5 years.  CMP every 6 months x 2 years then annually until year 5.  Chest, abdomen and pelvic CT scan every 3-6 months for 3 years then annually until year 5. Bone scan if indicated.  Imaging after 5 years as indicated by exam or symptoms. 3.  Labs today:  CBC with diff, ferritin, folate, SPEP. 4.  Guaiac cards x 3. 5.  Continue oral iron.  Discuss taking iron with OJ or vitamin C.  Increase iron from 1/day to BID if tolerated. 6.  RTC in 1 month for MD assessment and labs (CBC with diff, ferritin, retic-day before).   Lequita Asal, MD  07/14/2016, 2:24 PM

## 2016-07-14 NOTE — Progress Notes (Signed)
Patient here today as new evaluation regarding anemia.  Referred by Dr. Manuella Ghazi.

## 2016-07-15 ENCOUNTER — Ambulatory Visit (INDEPENDENT_AMBULATORY_CARE_PROVIDER_SITE_OTHER): Payer: Commercial Managed Care - HMO | Admitting: Family Medicine

## 2016-07-15 ENCOUNTER — Encounter: Payer: Self-pay | Admitting: Family Medicine

## 2016-07-15 VITALS — BP 134/62 | HR 61 | Temp 97.9°F | Resp 16

## 2016-07-15 DIAGNOSIS — G546 Phantom limb syndrome with pain: Secondary | ICD-10-CM

## 2016-07-15 DIAGNOSIS — N390 Urinary tract infection, site not specified: Secondary | ICD-10-CM | POA: Diagnosis not present

## 2016-07-15 DIAGNOSIS — B961 Klebsiella pneumoniae [K. pneumoniae] as the cause of diseases classified elsewhere: Secondary | ICD-10-CM

## 2016-07-15 DIAGNOSIS — F419 Anxiety disorder, unspecified: Secondary | ICD-10-CM | POA: Diagnosis not present

## 2016-07-15 DIAGNOSIS — B9689 Other specified bacterial agents as the cause of diseases classified elsewhere: Secondary | ICD-10-CM

## 2016-07-15 LAB — PROTEIN ELECTROPHORESIS, SERUM
A/G Ratio: 1.1 (ref 0.7–1.7)
Albumin ELP: 3.1 g/dL (ref 2.9–4.4)
Alpha-1-Globulin: 0.3 g/dL (ref 0.0–0.4)
Alpha-2-Globulin: 0.7 g/dL (ref 0.4–1.0)
Beta Globulin: 0.9 g/dL (ref 0.7–1.3)
Gamma Globulin: 0.9 g/dL (ref 0.4–1.8)
Globulin, Total: 2.8 (ref 2.2–3.9)
Total Protein ELP: 5.9 g/dL — ABNORMAL LOW (ref 6.0–8.5)

## 2016-07-15 MED ORDER — HYDROCODONE-ACETAMINOPHEN 7.5-325 MG PO TABS: 1.0000 | ORAL_TABLET | Freq: Three times a day (TID) | ORAL | 0 refills | Status: DC | PRN

## 2016-07-15 NOTE — Progress Notes (Signed)
Name: Amy [name]   MRN: [id number]    DOB: 09/29/1935   Date:07/15/2016       Progress Note  Subjective  Chief Complaint  Chief Complaint  Patient presents with  . Follow-up    medication refills    HPI  Pt. Presents for refill on Hydrocodone, she has left above-knee amputation in 2007, and has phantom pain at the site of amputation. Pain is described as sharp, rated at 10/10 last night ut sometimes it does not hurt at all. She takes Hydrocodone-Acetaminophen 7.5-325 mg every 8 hours as needed, this helps relieve her pain. No side effects reported.    Past Medical History:  Diagnosis Date  . Anxiety   . Bilateral cataracts   . Bronchitis    hx of   . Cancer of kidney, left ([place]) 2017  . Cataract   . Chronic kidney disease   . Depression   . Diabetes mellitus without complication ([place])   . Dry cough   . 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 GERD (gastroesophageal reflux disease)   . History of frequent urinary tract infections   . Hyperlipidemia   . Hypertension   . Left renal mass   . Numbness and tingling    right hand   . Obesity   . Peripheral artery disease ([place])   . Urinary frequency   . Urinary incontinence     Past Surgical History:  Procedure Laterality Date  . ABDOMINAL HYSTERECTOMY  1978  . CARPAL TUNNEL RELEASE Bilateral   . CHOLECYSTECTOMY    . LEG AMPUTATION     above the knee / left   . ROBOT ASSISTED LAPAROSCOPIC NEPHRECTOMY Left 01/15/2015   Procedure: ROBOTIC ASSISTED LAPAROSCOPIC RADICAL NEPHRECTOMY;  Surgeon: Alexis Frock, MD;  Location: [facility];  Service: Urology;  Laterality: Left;  . stent placement right leg       Family History  Problem Relation Age of Onset  . Cancer Mother     Stomach  . Cancer Father   . Diabetes Brother   . Cancer Brother     Social History   Social History  . Marital status: Married    Spouse name: N/A  . Number of children: N/A  . Years of  education: N/A   Occupational History  . Not on file.   Social History Main Topics  . Smoking status: Former Smoker    Packs/day: 1.50    Years: 51.00    Types: Cigarettes    Quit date: 05/24/2004  . Smokeless tobacco: Never Used  . Alcohol use No  . Drug use: No  . Sexual activity: Not Currently   Other Topics Concern  . Not on file   Social History Narrative  . No narrative on file     Current Outpatient Prescriptions:  .  acetaminophen (TYLENOL) 500 MG tablet, Take 500 mg by mouth every 6 (six) hours as needed. Pt takes 2 tablets as needed., Disp: , Rfl:  .  ALPRAZolam (XANAX) 0.5 MG tablet, Take 1 tablet (0.5 mg total) by mouth 3 (three) times daily as needed for anxiety., Disp: 90 tablet, Rfl: 0 .  busPIRone (BUSPAR) 10 MG tablet, Take 10 mg by mouth 2 (two) times daily., Disp: , Rfl:  .  citalopram (CELEXA) 20 MG tablet, TAKE 1 TABLET AT BEDTIME, Disp: 90 tablet, Rfl: 0 .  clopidogrel (PLAVIX) 75 MG tablet, TAKE 1 TABLET EVERY DAY,  Disp: 90 tablet, Rfl: 0 .  docusate sodium (COLACE) 100 MG capsule, Take 100 mg by mouth daily. Pt taking once a day with breakfast, Disp: , Rfl:  .  donepezil (ARICEPT) 5 MG tablet, Take 5 mg by mouth at bedtime., Disp: , Rfl:  .  ferrous sulfate 325 (65 FE) MG tablet, Take 1 tablet (325 mg total) by mouth daily with breakfast., Disp: 30 tablet, Rfl: 2 .  gabapentin (NEURONTIN) 300 MG capsule, Take 1 capsule (300 mg total) by mouth 3 (three) times daily., Disp: 270 capsule, Rfl: 1 .  glucose blood test strip, Use as instructed., Disp: 100 each, Rfl: 12 .  HYDROcodone-acetaminophen (NORCO) 7.5-325 MG tablet, Take 1 tablet by mouth 3 (three) times daily as needed., Disp: 90 tablet, Rfl: 0 .  metoprolol (LOPRESSOR) 50 MG tablet, TAKE 1 TABLET TWICE DAILY, Disp: 180 tablet, Rfl: 0 .  Multiple Vitamins-Minerals (HAIR SKIN AND NAILS FORMULA) TABS, Take by mouth. One tablet daily, Disp: , Rfl:  .  pantoprazole (PROTONIX) 40 MG tablet, TAKE 1 TABLET (40  MG TOTAL) BY MOUTH DAILY., Disp: 90 tablet, Rfl: 1 .  QUEtiapine (SEROQUEL) 25 MG tablet, Take 25 mg by mouth at bedtime. Then increase to 2 qhs, Disp: , Rfl:  .  QUEtiapine (SEROQUEL) 50 MG tablet, Take 50 mg by mouth., Disp: , Rfl:  .  ranitidine (ZANTAC) 150 MG capsule, TAKE 1 CAPSULE (150 MG TOTAL) BY MOUTH AT BEDTIME., Disp: 90 capsule, Rfl: 1 .  simvastatin (ZOCOR) 40 MG tablet, TAKE 1 TABLET EVERY DAY, Disp: 90 tablet, Rfl: 0 .  VENTOLIN HFA 108 (90 BASE) MCG/ACT inhaler, Inhale 1-2 puffs into the lungs every 4 (four) hours as needed for wheezing or shortness of breath. , Disp: , Rfl:  .  Vitamin D, Ergocalciferol, (DRISDOL) 50000 UNITS CAPS capsule, , Disp: , Rfl:   Allergies  Allergen Reactions  . Fentanyl Other (See Comments)    urine retention  . Penicillins Itching and Rash     ROS  Please see history of present illness for complete discussion of ROS  Objective  Vitals:   07/15/16 1337  BP: 134/62  Pulse: 61  Resp: 16  Temp: 97.9 F (36.6 C)  SpO2: 92%    Physical Exam  Constitutional: She is oriented to person, place, and time and well-developed, well-nourished, and in no distress.  HENT:  Head: Normocephalic and atraumatic.  Cardiovascular: Normal rate, regular rhythm and normal heart sounds.   No murmur heard. Pulmonary/Chest: Effort normal. No respiratory distress. She has wheezes in the right upper field and the left upper field.  Abdominal: Soft. Bowel sounds are normal. There is no tenderness.  Musculoskeletal: She exhibits no edema.  Left above-knee amputation, no swelling mild tenderness to palpation,   Neurological: She is alert and oriented to person, place, and time.  Psychiatric: Mood, memory, affect and judgment normal.  Nursing note and vitals reviewed.      Assessment & Plan  1. Phantom limb syndrome with pain (HCC)  Responsive to Vicodin taken up to every 8 hours as needed, along with gabapentin. Refills provided -  HYDROcodone-acetaminophen (NORCO) 7.5-325 MG tablet; Take 1 tablet by mouth 3 (three) times daily as needed.  Dispense: 90 tablet; Refill: 0  2. Anxiety Patient wishes to go back on alprazolam, reports buspirone started by the psychiatrist is not effective at all and she feels very anxious and jittery. She is requesting me to restart on alprazolam. I informed her that she must contact  the covering provider to determine if it's okay for me to change from BuSpar to alprazolam. Verbalized agreement  3. UTI due to Klebsiella species Obtain point-of-care urinalysis to make sure that the infection has resolved. Patient has completed the antimicrobial course. - POCT urinalysis dipstick   Makayla Confer [facility] 07/15/2016 2:00 PM

## 2016-07-17 ENCOUNTER — Encounter: Payer: Self-pay | Admitting: Hematology and Oncology

## 2016-07-17 DIAGNOSIS — R634 Abnormal weight loss: Secondary | ICD-10-CM | POA: Insufficient documentation

## 2016-07-21 ENCOUNTER — Telehealth: Payer: Self-pay | Admitting: *Deleted

## 2016-07-21 ENCOUNTER — Inpatient Hospital Stay: Payer: Medicare HMO

## 2016-07-21 DIAGNOSIS — D649 Anemia, unspecified: Secondary | ICD-10-CM

## 2016-07-21 DIAGNOSIS — Z89612 Acquired absence of left leg above knee: Secondary | ICD-10-CM | POA: Diagnosis not present

## 2016-07-21 DIAGNOSIS — S31125A Laceration of abdominal wall with foreign body, periumbilic region without penetration into peritoneal cavity, initial encounter: Secondary | ICD-10-CM | POA: Diagnosis not present

## 2016-07-21 NOTE — Telephone Encounter (Signed)
Called patient to inform her that the stool cards were completed inaccurately.  Informed patient to come back to [facility] center and pick up more cards with instructions, voiced understanding.

## 2016-07-26 ENCOUNTER — Inpatient Hospital Stay: Payer: Medicare HMO | Attending: Hematology and Oncology

## 2016-07-26 ENCOUNTER — Other Ambulatory Visit: Payer: Self-pay | Admitting: *Deleted

## 2016-07-26 DIAGNOSIS — D649 Anemia, unspecified: Secondary | ICD-10-CM

## 2016-07-26 DIAGNOSIS — Z89612 Acquired absence of left leg above knee: Secondary | ICD-10-CM | POA: Insufficient documentation

## 2016-07-26 DIAGNOSIS — Z7902 Long term (current) use of antithrombotics/antiplatelets: Secondary | ICD-10-CM | POA: Insufficient documentation

## 2016-07-26 DIAGNOSIS — Z86718 Personal history of other venous thrombosis and embolism: Secondary | ICD-10-CM | POA: Insufficient documentation

## 2016-07-26 DIAGNOSIS — M545 Low back pain: Secondary | ICD-10-CM | POA: Insufficient documentation

## 2016-07-26 DIAGNOSIS — Z79899 Other long term (current) drug therapy: Secondary | ICD-10-CM | POA: Insufficient documentation

## 2016-07-26 DIAGNOSIS — Z880 Allergy status to penicillin: Secondary | ICD-10-CM | POA: Insufficient documentation

## 2016-07-26 DIAGNOSIS — I129 Hypertensive chronic kidney disease with stage 1 through stage 4 chronic kidney disease, or unspecified chronic kidney disease: Secondary | ICD-10-CM | POA: Insufficient documentation

## 2016-07-26 DIAGNOSIS — E669 Obesity, unspecified: Secondary | ICD-10-CM | POA: Insufficient documentation

## 2016-07-26 DIAGNOSIS — G4700 Insomnia, unspecified: Secondary | ICD-10-CM | POA: Insufficient documentation

## 2016-07-26 DIAGNOSIS — R5382 Chronic fatigue, unspecified: Secondary | ICD-10-CM | POA: Insufficient documentation

## 2016-07-26 DIAGNOSIS — Z905 Acquired absence of kidney: Secondary | ICD-10-CM | POA: Insufficient documentation

## 2016-07-26 DIAGNOSIS — N189 Chronic kidney disease, unspecified: Secondary | ICD-10-CM | POA: Insufficient documentation

## 2016-07-26 DIAGNOSIS — Z87891 Personal history of nicotine dependence: Secondary | ICD-10-CM | POA: Insufficient documentation

## 2016-07-26 DIAGNOSIS — R634 Abnormal weight loss: Secondary | ICD-10-CM | POA: Insufficient documentation

## 2016-07-26 DIAGNOSIS — E1122 Type 2 diabetes mellitus with diabetic chronic kidney disease: Secondary | ICD-10-CM | POA: Insufficient documentation

## 2016-07-26 DIAGNOSIS — D509 Iron deficiency anemia, unspecified: Secondary | ICD-10-CM | POA: Insufficient documentation

## 2016-07-26 DIAGNOSIS — Z85528 Personal history of other malignant neoplasm of kidney: Secondary | ICD-10-CM | POA: Insufficient documentation

## 2016-07-26 DIAGNOSIS — Z87440 Personal history of urinary (tract) infections: Secondary | ICD-10-CM | POA: Insufficient documentation

## 2016-07-26 DIAGNOSIS — E785 Hyperlipidemia, unspecified: Secondary | ICD-10-CM | POA: Insufficient documentation

## 2016-07-26 DIAGNOSIS — R630 Anorexia: Secondary | ICD-10-CM | POA: Insufficient documentation

## 2016-07-26 DIAGNOSIS — K5900 Constipation, unspecified: Secondary | ICD-10-CM | POA: Insufficient documentation

## 2016-07-26 DIAGNOSIS — K219 Gastro-esophageal reflux disease without esophagitis: Secondary | ICD-10-CM | POA: Insufficient documentation

## 2016-07-26 DIAGNOSIS — E1151 Type 2 diabetes mellitus with diabetic peripheral angiopathy without gangrene: Secondary | ICD-10-CM | POA: Insufficient documentation

## 2016-07-26 LAB — OCCULT BLOOD X 1 CARD TO LAB, STOOL
Fecal Occult Bld: NEGATIVE
Fecal Occult Bld: NEGATIVE
Fecal Occult Bld: NEGATIVE

## 2016-08-09 ENCOUNTER — Ambulatory Visit: Payer: Commercial Managed Care - HMO | Admitting: Family Medicine

## 2016-08-09 ENCOUNTER — Ambulatory Visit: Payer: Commercial Managed Care - HMO

## 2016-08-10 ENCOUNTER — Inpatient Hospital Stay: Payer: Medicare HMO

## 2016-08-10 DIAGNOSIS — D509 Iron deficiency anemia, unspecified: Secondary | ICD-10-CM | POA: Diagnosis not present

## 2016-08-10 DIAGNOSIS — D649 Anemia, unspecified: Secondary | ICD-10-CM

## 2016-08-10 LAB — CBC WITH DIFFERENTIAL/PLATELET
Basophils Absolute: 0.1 10*3/uL (ref 0–0.1)
Basophils Relative: 1 %
Eosinophils Absolute: 0.9 10*3/uL — ABNORMAL HIGH (ref 0–0.7)
Eosinophils Relative: 10 %
HCT: 37.3 % (ref 35.0–47.0)
Hemoglobin: 12.3 g/dL (ref 12.0–16.0)
Lymphocytes Relative: 23 %
Lymphs Abs: 2.1 10*3/uL (ref 1.0–3.6)
MCH: 29.5 pg (ref 26.0–34.0)
MCHC: 33 g/dL (ref 32.0–36.0)
MCV: 89.3 fL (ref 80.0–100.0)
Monocytes Absolute: 0.6 10*3/uL (ref 0.2–0.9)
Monocytes Relative: 6 %
Neutro Abs: 5.3 10*3/uL (ref 1.4–6.5)
Neutrophils Relative %: 60 %
Platelets: 204 10*3/uL (ref 150–440)
RBC: 4.17 MIL/uL (ref 3.80–5.20)
RDW: 17.8 % — ABNORMAL HIGH (ref 11.5–14.5)
WBC: 9 10*3/uL (ref 3.6–11.0)

## 2016-08-10 LAB — FERRITIN: Ferritin: 18 ng/mL (ref 11–307)

## 2016-08-11 ENCOUNTER — Inpatient Hospital Stay (HOSPITAL_BASED_OUTPATIENT_CLINIC_OR_DEPARTMENT_OTHER): Payer: Medicare HMO | Admitting: Hematology and Oncology

## 2016-08-11 ENCOUNTER — Encounter: Payer: Self-pay | Admitting: Hematology and Oncology

## 2016-08-11 VITALS — BP 104/69 | HR 54 | Temp 97.8°F | Resp 18

## 2016-08-11 DIAGNOSIS — K5900 Constipation, unspecified: Secondary | ICD-10-CM | POA: Diagnosis not present

## 2016-08-11 DIAGNOSIS — Z7902 Long term (current) use of antithrombotics/antiplatelets: Secondary | ICD-10-CM

## 2016-08-11 DIAGNOSIS — E785 Hyperlipidemia, unspecified: Secondary | ICD-10-CM | POA: Diagnosis not present

## 2016-08-11 DIAGNOSIS — G4700 Insomnia, unspecified: Secondary | ICD-10-CM | POA: Diagnosis not present

## 2016-08-11 DIAGNOSIS — R634 Abnormal weight loss: Secondary | ICD-10-CM

## 2016-08-11 DIAGNOSIS — R5382 Chronic fatigue, unspecified: Secondary | ICD-10-CM

## 2016-08-11 DIAGNOSIS — Z880 Allergy status to penicillin: Secondary | ICD-10-CM

## 2016-08-11 DIAGNOSIS — Z86718 Personal history of other venous thrombosis and embolism: Secondary | ICD-10-CM

## 2016-08-11 DIAGNOSIS — Z89612 Acquired absence of left leg above knee: Secondary | ICD-10-CM

## 2016-08-11 DIAGNOSIS — K219 Gastro-esophageal reflux disease without esophagitis: Secondary | ICD-10-CM

## 2016-08-11 DIAGNOSIS — I129 Hypertensive chronic kidney disease with stage 1 through stage 4 chronic kidney disease, or unspecified chronic kidney disease: Secondary | ICD-10-CM | POA: Diagnosis not present

## 2016-08-11 DIAGNOSIS — N189 Chronic kidney disease, unspecified: Secondary | ICD-10-CM | POA: Diagnosis not present

## 2016-08-11 DIAGNOSIS — E1122 Type 2 diabetes mellitus with diabetic chronic kidney disease: Secondary | ICD-10-CM

## 2016-08-11 DIAGNOSIS — M545 Low back pain: Secondary | ICD-10-CM | POA: Diagnosis not present

## 2016-08-11 DIAGNOSIS — R630 Anorexia: Secondary | ICD-10-CM | POA: Diagnosis not present

## 2016-08-11 DIAGNOSIS — Z905 Acquired absence of kidney: Secondary | ICD-10-CM

## 2016-08-11 DIAGNOSIS — E669 Obesity, unspecified: Secondary | ICD-10-CM

## 2016-08-11 DIAGNOSIS — D49519 Neoplasm of unspecified behavior of unspecified kidney: Secondary | ICD-10-CM

## 2016-08-11 DIAGNOSIS — Z87891 Personal history of nicotine dependence: Secondary | ICD-10-CM

## 2016-08-11 DIAGNOSIS — D509 Iron deficiency anemia, unspecified: Secondary | ICD-10-CM

## 2016-08-11 DIAGNOSIS — Z85528 Personal history of other malignant neoplasm of kidney: Secondary | ICD-10-CM

## 2016-08-11 DIAGNOSIS — Z79899 Other long term (current) drug therapy: Secondary | ICD-10-CM

## 2016-08-11 DIAGNOSIS — E1151 Type 2 diabetes mellitus with diabetic peripheral angiopathy without gangrene: Secondary | ICD-10-CM

## 2016-08-11 DIAGNOSIS — Z87440 Personal history of urinary (tract) infections: Secondary | ICD-10-CM

## 2016-08-11 LAB — RETICULOCYTES
RBC.: 4.08 MIL/uL (ref 3.80–5.20)
Retic Count, Absolute: 61.2 10*3/uL (ref 19.0–183.0)
Retic Ct Pct: 1.5 % (ref 0.4–3.1)

## 2016-08-11 NOTE — Progress Notes (Signed)
[facility] day:  08/11/2016  Chief Complaint: Amy [name] is a 81 y.o. female with anemia who is seen for review of work-up and reassessment on oral ion.  HPI:   The patient was last seen in the hematology clinic on 07/14/2016.  At that time, she was seen for initial consultation.  Outside labs were consistent with iron deficiency anemia.  Diet was poor.  She noted reflux for 15-30 years.  Per patient, EGD and colonoscopy were negative in 2015.  She had a history of renal cell carcinoma s/p nephrectomy in 2016.  She had renal insufficiency.  She had lost weight.  Labs on 07/14/2016 revealed a hematocrit 35.5, hemoglobin 11.6, MCV 87.6, platelets 173,000, white count 7900 with a normal differential.  Ferritin was 22.  Folate was 9.8.  SPEP revealed no monoclonal protein.  Guaiac cards x 3 were negative.   Labs on 08/10/2016 revealed hematocrit 37.3, hemoglobin 12.3, and MCV 89.3.  Ferritin was 18.  Retic was 1.5%  During the interim, she continues to have chronic fatigue.  She has occasional constipation, controlled with stool softener every other day. She denies any nausea, vomiting, diarrhea, melena, hematochezia, bruising or bleeding. She reports insomnia and disturbed circadian rhythm, sleeping during the day, unable to sleep at night.  She reports pain in her lower back every morning after lying on her back at night.   Past Medical History:  Diagnosis Date  . Anxiety   . Bilateral cataracts   . Bronchitis    hx of   . Cancer of kidney, left ([place]) 2017  . Cataract   . Chronic kidney disease   . Depression   . Diabetes mellitus without complication ([place])   . Dry cough   . 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 GERD (gastroesophageal reflux disease)   . History of frequent urinary tract infections   . Hyperlipidemia   . Hypertension   . Left renal mass   . Numbness and tingling    right hand   . Obesity   . Peripheral artery disease ([place])   . Urinary frequency   . Urinary incontinence     Past Surgical History:  Procedure Laterality Date  . ABDOMINAL HYSTERECTOMY  1978  . CARPAL TUNNEL RELEASE Bilateral   . CHOLECYSTECTOMY    . LEG AMPUTATION     above the knee / left   . ROBOT ASSISTED LAPAROSCOPIC NEPHRECTOMY Left 01/15/2015   Procedure: ROBOTIC ASSISTED LAPAROSCOPIC RADICAL NEPHRECTOMY;  Surgeon: Alexis Frock, MD;  Location: [facility];  Service: Urology;  Laterality: Left;  . stent placement right leg       Family History  Problem Relation Age of Onset  . Cancer Mother     Stomach  . Cancer Father   . Diabetes Brother   . Cancer Brother     Social History:  reports that she quit smoking about 12 years ago. Her smoking use included Cigarettes. She has a 76.50 pack-year smoking history. She has never used smokeless tobacco. She reports that she does not drink alcohol or use drugs.  She lives in [place].  The patient is accompanied by her husband, Tresa Moore, today.  Allergies:  Allergies  Allergen Reactions  . Fentanyl Other (See Comments)    urine retention  . Penicillins Itching and Rash    Current Medications: Current Outpatient Prescriptions  Medication Sig Dispense Refill  .  acetaminophen (TYLENOL) 500 MG tablet Take 500 mg by mouth every 6 (six) hours as needed. Pt takes 2 tablets as needed.    . busPIRone (BUSPAR) 10 MG tablet Take 10 mg by mouth 2 (two) times daily.    . citalopram (CELEXA) 20 MG tablet TAKE 1 TABLET AT BEDTIME 90 tablet 0  . clopidogrel (PLAVIX) 75 MG tablet TAKE 1 TABLET EVERY DAY 90 tablet 0  . docusate sodium (COLACE) 100 MG capsule Take 100 mg by mouth daily. Pt taking once a day with breakfast    . donepezil (ARICEPT) 5 MG tablet Take 5 mg by mouth at bedtime.    . ferrous sulfate 325 (65 FE) MG tablet Take 1 tablet (325 mg total) by mouth daily with breakfast. 30 tablet 2  . gabapentin (NEURONTIN) 300 MG capsule Take 1  capsule (300 mg total) by mouth 3 (three) times daily. 270 capsule 1  . glucose blood test strip Use as instructed. 100 each 12  . HYDROcodone-acetaminophen (NORCO) 7.5-325 MG tablet Take 1 tablet by mouth 3 (three) times daily as needed. 90 tablet 0  . metoprolol (LOPRESSOR) 50 MG tablet TAKE 1 TABLET TWICE DAILY 180 tablet 0  . Multiple Vitamins-Minerals (HAIR SKIN AND NAILS FORMULA) TABS Take by mouth. One tablet daily    . pantoprazole (PROTONIX) 40 MG tablet TAKE 1 TABLET (40 MG TOTAL) BY MOUTH DAILY. 90 tablet 1  . QUEtiapine (SEROQUEL) 25 MG tablet Take 25 mg by mouth at bedtime. Then increase to 2 qhs    . QUEtiapine (SEROQUEL) 50 MG tablet Take 50 mg by mouth.    . ranitidine (ZANTAC) 150 MG capsule TAKE 1 CAPSULE (150 MG TOTAL) BY MOUTH AT BEDTIME. 90 capsule 1  . simvastatin (ZOCOR) 40 MG tablet TAKE 1 TABLET EVERY DAY 90 tablet 0  . VENTOLIN HFA 108 (90 BASE) MCG/ACT inhaler Inhale 1-2 puffs into the lungs every 4 (four) hours as needed for wheezing or shortness of breath.     . Vitamin D, Ergocalciferol, (DRISDOL) 50000 UNITS CAPS capsule     . ALPRAZolam (XANAX) 0.5 MG tablet Take 1 tablet (0.5 mg total) by mouth 3 (three) times daily as needed for anxiety. (Patient not taking: Reported on 08/11/2016) 90 tablet 0   No current facility-administered medications for this visit.     Review of Systems:  GENERAL:  Fatigue.  No fevers or sweats.  Unspecified weight loss. PERFORMANCE STATUS (ECOG):  2-3 HEENT:  No visual changes, runny nose, sore throat, mouth sores or tenderness. Lungs: No shortness of breath or cough.  No hemoptysis. Cardiac:  No chest pain, palpitations, orthopnea, or PND. GI:  Black stool on iron.  Constipation.  Poor appetite.  No nausea, vomiting, diarrhea or hematochezia. GU:  No urgency, frequency, dysuria, or hematuria. Musculoskeletal:  Back hurts on awakening.  Bilateral shoulder pain.  No muscle tenderness. Extremities:  No pain or swelling. Skin:  No  rashes or skin changes. Neuro:  Right 3rd finger numb.  No headache, weakness, balance or coordination issues. Endocrine:  No diabetes, thyroid issues, hot flashes or night sweats. Psych:  No mood changes, depression or anxiety.  Insomnia. Pain: Back pain. Review of systems:  All other systems reviewed and found to be negative.  Physical Exam: Blood pressure 104/69, pulse (!) 54, temperature 97.8 F (36.6 C), temperature source Tympanic, resp. rate 18. GENERAL:  Well developed, well nourished, woman sitting comfortably in a wheelchair in the exam room in no acute distress. MENTAL STATUS:  Alert and oriented to person, place and time. HEAD:  Graying hair.  Head tilt to the left.  Normocephalic, atraumatic, face symmetric, no Cushingoid features. EYES:  Glasses.  Sacoya Mcgourty eyes.  Pupils equal round and reactive to light and accomodation.  No conjunctivitis or scleral icterus. ENT:  Oropharynx clear without lesion.  Tongue normal. Mucous membranes moist.  RESPIRATORY:  Clear to auscultation without rales, wheezes or rhonchi. CARDIOVASCULAR:  Regular rate and rhythm without murmur, rub or gallop. ABDOMEN:  Soft, non-tender, with active bowel sounds, and no hepatosplenomegaly.  No masses. SKIN:  No rashes, ulcers or lesions. EXTREMITIES: Left sided above the knee amputation.  No edema, no skin discoloration or tenderness.  No palpable cords. LYMPH NODES: No palpable cervical, supraclavicular, axillary or inguinal adenopathy  NEUROLOGICAL: Unremarkable. PSYCH:  Appropriate.   Appointment on 08/10/2016  Component Date Value Ref Range Status  . WBC 08/10/2016 9.0  3.6 - 11.0 K/uL Final  . RBC 08/10/2016 4.17  3.80 - 5.20 MIL/uL Final  . Hemoglobin 08/10/2016 12.3  12.0 - 16.0 g/dL Final  . HCT 08/10/2016 37.3  35.0 - 47.0 % Final  . MCV 08/10/2016 89.3  80.0 - 100.0 fL Final  . MCH 08/10/2016 29.5  26.0 - 34.0 pg Final  . MCHC 08/10/2016 33.0  32.0 - 36.0 g/dL Final  . RDW 08/10/2016 17.8* 11.5  - 14.5 % Final  . Platelets 08/10/2016 204  150 - 440 K/uL Final  . Neutrophils Relative % 08/10/2016 60  % Final  . Neutro Abs 08/10/2016 5.3  1.4 - 6.5 K/uL Final  . Lymphocytes Relative 08/10/2016 23  % Final  . Lymphs Abs 08/10/2016 2.1  1.0 - 3.6 K/uL Final  . Monocytes Relative 08/10/2016 6  % Final  . Monocytes Absolute 08/10/2016 0.6  0.2 - 0.9 K/uL Final  . Eosinophils Relative 08/10/2016 10  % Final  . Eosinophils Absolute 08/10/2016 0.9* 0 - 0.7 K/uL Final  . Basophils Relative 08/10/2016 1  % Final  . Basophils Absolute 08/10/2016 0.1  0 - 0.1 K/uL Final  . Ferritin 08/10/2016 18  11 - 307 ng/mL Final  . Retic Ct Pct 08/10/2016 1.5  0.4 - 3.1 % Final  . RBC. 08/10/2016 4.08  3.80 - 5.20 MIL/uL Final  . Retic Count, Manual 08/10/2016 61.2  19.0 - 183.0 K/uL Final    Assessment:  SHALONDA SACHSE is a 81 y.o. female with mild iron deficiency anemia.  EGD and colonoscopy on 10/10/2013 were negative per patient.  She has had reflux for the past 15-30 years.  She is on Prilosec.  Diet is not good.  She began oral iron on 06/29/2016.  She denies any pica.  Labs from 06/22/2016 confirmed iron deficiency.  Ferritin was 13 with an iron saturation of 17%.  Reticulocyte count was 2.4%.  B12 was normal on 03/31/2016.  She has a history of stage III renal cell carcinoma s/p robotic-assisted laparoscopic left radical nephrectomy on 01/09/2015. Pathology revealed a 6.3 cm renal cell carcinoma.  Tumor was predominantly clear cell with a minor component of type I papillary.  Fuhrman grade 3 of 4.  Tumor extended into the renal vein.  There was focal extension into the perirenal and sinus adipose tissue.  Margins were negative.  There was metastatic tumor in 2 of 2 lymph nodes.  Pathologic stage was T3aN1Mx.    Abdomen and pelvic CT scan on 11/29/2014 revealed a 5.5 x 3.7 solid enhancing mass arising from the upper pole of  the left kidney.  There was evidence of renal vein invasion.  There were  borderline enlarged periaortic lymph node (9 mm).   She has chronic renal insufficiency.  Creatinine was 1.86 (CrCl 26 ml/min) on 06/15/2016.  She has a history of left lower etxremity DVT.  She is s/p left AKA.  Symptomatically, appetite is poor.  She has lost an unspecified amount of weight.  Exam is stable.  Ferritin is 18.  Plan: 1.  Discuss labs from 08/10/2016.  Hemoglobin has improved on oral iron. 2.  Continue oral iron.  Ferritin goal is 100. 3.  Continue follow-up on renal cell carcinoma:  H&P every 3-6 months x 3 years then annually up to 5 years.  CMP every 6 months x 2 years then annually until year 5.  Chest, abdomen and pelvic CT scan every 3-6 months for 3 years then annually until year 5. Bone scan if indicated.  Imaging after 5 years as indicated by exam or symptoms. 4.  Schedule chest, abdomen, and pelvic CT scan without contrast within 1 week. 5.  RTC in 1 month for MD assessment and labs (CBC with diff, ferritin, retic-day before).   Lucendia Herrlich, NP  I saw and evaluated the patient, participating in the key portions of the service and reviewing pertinent diagnostic studies and records.  I reviewed the nurse practitioner's note and agree with the findings and the plan.  The assessment and plan were discussed with the patient.  Additional diagnostic studies of CT scans (C/A/P) are needed to follow-up on renal cell carcinoma and would change the clinical management.  Several questions were asked by the patient and answered.     Lequita Asal, MD  08/11/2016

## 2016-08-11 NOTE — Progress Notes (Signed)
Patient offers no complaints today.  Patient saw Dr. Rufina Falco @  [facility] who ordered an [place] on patient's brain. Patient put on medication for dementia.  Took patient off alprazolam because it accelerates dementia.

## 2016-08-16 ENCOUNTER — Ambulatory Visit
Admission: RE | Admit: 2016-08-16 | Discharge: 2016-08-16 | Disposition: A | Discharge status: Home / Self Care | Payer: Medicare HMO | Source: Ambulatory Visit | Attending: Oncology | Admitting: Oncology

## 2016-08-16 DIAGNOSIS — D509 Iron deficiency anemia, unspecified: Secondary | ICD-10-CM

## 2016-08-16 DIAGNOSIS — R918 Other nonspecific abnormal finding of lung field: Secondary | ICD-10-CM | POA: Insufficient documentation

## 2016-08-16 DIAGNOSIS — Z9049 Acquired absence of other specified parts of digestive tract: Secondary | ICD-10-CM | POA: Insufficient documentation

## 2016-08-16 DIAGNOSIS — I77811 Abdominal aortic ectasia: Secondary | ICD-10-CM | POA: Diagnosis not present

## 2016-08-16 DIAGNOSIS — J432 Centrilobular emphysema: Secondary | ICD-10-CM | POA: Diagnosis not present

## 2016-08-16 DIAGNOSIS — J984 Other disorders of lung: Secondary | ICD-10-CM | POA: Diagnosis not present

## 2016-08-16 DIAGNOSIS — K439 Ventral hernia without obstruction or gangrene: Secondary | ICD-10-CM | POA: Diagnosis not present

## 2016-08-16 DIAGNOSIS — Z9889 Other specified postprocedural states: Secondary | ICD-10-CM | POA: Insufficient documentation

## 2016-08-16 DIAGNOSIS — D49519 Neoplasm of unspecified behavior of unspecified kidney: Secondary | ICD-10-CM | POA: Diagnosis not present

## 2016-08-16 DIAGNOSIS — M4854XA Collapsed vertebra, not elsewhere classified, thoracic region, initial encounter for fracture: Secondary | ICD-10-CM | POA: Diagnosis not present

## 2016-08-16 DIAGNOSIS — N289 Disorder of kidney and ureter, unspecified: Secondary | ICD-10-CM | POA: Diagnosis not present

## 2016-08-16 DIAGNOSIS — C649 Malignant neoplasm of unspecified kidney, except renal pelvis: Secondary | ICD-10-CM | POA: Diagnosis not present

## 2016-08-16 DIAGNOSIS — Z905 Acquired absence of kidney: Secondary | ICD-10-CM | POA: Insufficient documentation

## 2016-08-16 DIAGNOSIS — I2510 Atherosclerotic heart disease of native coronary artery without angina pectoris: Secondary | ICD-10-CM | POA: Diagnosis not present

## 2016-08-16 DIAGNOSIS — I700 Atherosclerosis of aorta: Secondary | ICD-10-CM | POA: Diagnosis not present

## 2016-08-16 DIAGNOSIS — J439 Emphysema, unspecified: Secondary | ICD-10-CM | POA: Diagnosis not present

## 2016-08-20 ENCOUNTER — Ambulatory Visit (INDEPENDENT_AMBULATORY_CARE_PROVIDER_SITE_OTHER): Payer: Commercial Managed Care - HMO | Admitting: Family Medicine

## 2016-08-20 ENCOUNTER — Encounter: Payer: Self-pay | Admitting: Family Medicine

## 2016-08-20 DIAGNOSIS — G546 Phantom limb syndrome with pain: Secondary | ICD-10-CM

## 2016-08-20 DIAGNOSIS — D649 Anemia, unspecified: Secondary | ICD-10-CM

## 2016-08-20 MED ORDER — FERROUS SULFATE 325 (65 FE) MG PO TABS: 325.0000 mg | ORAL_TABLET | Freq: Two times a day (BID) | ORAL | 0 refills | Status: DC

## 2016-08-20 MED ORDER — HYDROCODONE-ACETAMINOPHEN 7.5-325 MG PO TABS: 1.0000 | ORAL_TABLET | Freq: Three times a day (TID) | ORAL | 0 refills | Status: DC | PRN

## 2016-08-20 NOTE — Progress Notes (Signed)
Name: Amy [name]   MRN: [id number]    DOB: [birth year]   Date:08/20/2016       Progress Note  Subjective  Chief Complaint  Chief Complaint  Patient presents with  . Medication Refill    HPI  Pt. Presents for follow up of chronic phantom limb pain in leftl leg, s/p left above-knee amputation. Pain is rated at 3-4/10, takes Hydrocodone-Acetaminophen 7.5-325 mg every 8 hours as needed. This helps relieve her pain, no side effects reported.  She is also requesting a refill for Ferrous Sulfate 325 mg twice daily, dosage increased by Oncology to BID for anemia.    Past Medical History:  Diagnosis Date  . Anxiety   . Bilateral cataracts   . Bronchitis    hx of   . Cancer of kidney, left ([place]) 2017  . Cataract   . Chronic kidney disease   . Depression   . Diabetes mellitus without complication ([place])   . Dry cough   . 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 GERD (gastroesophageal reflux disease)   . History of frequent urinary tract infections   . Hyperlipidemia   . Hypertension   . Left renal mass   . Numbness and tingling    right hand   . Obesity   . Peripheral artery disease ([place])   . Urinary frequency   . Urinary incontinence     Past Surgical History:  Procedure Laterality Date  . ABDOMINAL HYSTERECTOMY  1978  . CARPAL TUNNEL RELEASE Bilateral   . CHOLECYSTECTOMY    . LEG AMPUTATION     above the knee / left   . ROBOT ASSISTED LAPAROSCOPIC NEPHRECTOMY Left 01/15/2015   Procedure: ROBOTIC ASSISTED LAPAROSCOPIC RADICAL NEPHRECTOMY;  Surgeon: Alexis Frock, MD;  Location: [facility];  Service: Urology;  Laterality: Left;  . stent placement right leg       Family History  Problem Relation Age of Onset  . Cancer Mother     Stomach  . Cancer Father   . Diabetes Brother   . Cancer Brother     Social History   Social History  . Marital status: Married    Spouse name: N/A  . Number of children: N/A  . Years of  education: N/A   Occupational History  . Not on file.   Social History Main Topics  . Smoking status: Former Smoker    Packs/day: 1.50    Years: 51.00    Types: Cigarettes    Quit date: 05/24/2004  . Smokeless tobacco: Never Used  . Alcohol use No  . Drug use: No  . Sexual activity: Not Currently   Other Topics Concern  . Not on file   Social History Narrative  . No narrative on file     Current Outpatient Prescriptions:  .  acetaminophen (TYLENOL) 500 MG tablet, Take 500 mg by mouth every 6 (six) hours as needed. Pt takes 2 tablets as needed., Disp: , Rfl:  .  ALPRAZolam (XANAX) 0.5 MG tablet, Take 1 tablet (0.5 mg total) by mouth 3 (three) times daily as needed for anxiety., Disp: 90 tablet, Rfl: 0 .  busPIRone (BUSPAR) 10 MG tablet, Take 10 mg by mouth 2 (two) times daily., Disp: , Rfl:  .  citalopram (CELEXA) 20 MG tablet, TAKE 1 TABLET AT BEDTIME, Disp: 90 tablet, Rfl: 0 .  clopidogrel (PLAVIX) 75 MG tablet, TAKE 1 TABLET EVERY DAY, Disp:  90 tablet, Rfl: 0 .  docusate sodium (COLACE) 100 MG capsule, Take 100 mg by mouth daily. Pt taking once a day with breakfast, Disp: , Rfl:  .  donepezil (ARICEPT) 5 MG tablet, Take 5 mg by mouth at bedtime., Disp: , Rfl:  .  ferrous sulfate 325 (65 FE) MG tablet, Take 1 tablet (325 mg total) by mouth daily with breakfast., Disp: 30 tablet, Rfl: 2 .  gabapentin (NEURONTIN) 300 MG capsule, Take 1 capsule (300 mg total) by mouth 3 (three) times daily., Disp: 270 capsule, Rfl: 1 .  glucose blood test strip, Use as instructed., Disp: 100 each, Rfl: 12 .  HYDROcodone-acetaminophen (NORCO) 7.5-325 MG tablet, Take 1 tablet by mouth 3 (three) times daily as needed., Disp: 90 tablet, Rfl: 0 .  metoprolol (LOPRESSOR) 50 MG tablet, TAKE 1 TABLET TWICE DAILY, Disp: 180 tablet, Rfl: 0 .  Multiple Vitamins-Minerals (HAIR SKIN AND NAILS FORMULA) TABS, Take by mouth. One tablet daily, Disp: , Rfl:  .  pantoprazole (PROTONIX) 40 MG tablet, TAKE 1 TABLET (40  MG TOTAL) BY MOUTH DAILY., Disp: 90 tablet, Rfl: 1 .  QUEtiapine (SEROQUEL) 25 MG tablet, Take 25 mg by mouth at bedtime. Then increase to 2 qhs, Disp: , Rfl:  .  QUEtiapine (SEROQUEL) 50 MG tablet, Take 50 mg by mouth., Disp: , Rfl:  .  ranitidine (ZANTAC) 150 MG capsule, TAKE 1 CAPSULE (150 MG TOTAL) BY MOUTH AT BEDTIME., Disp: 90 capsule, Rfl: 1 .  simvastatin (ZOCOR) 40 MG tablet, TAKE 1 TABLET EVERY DAY, Disp: 90 tablet, Rfl: 0 .  VENTOLIN HFA 108 (90 BASE) MCG/ACT inhaler, Inhale 1-2 puffs into the lungs every 4 (four) hours as needed for wheezing or shortness of breath. , Disp: , Rfl:  .  Vitamin D, Ergocalciferol, (DRISDOL) 50000 UNITS CAPS capsule, , Disp: , Rfl:   Allergies  Allergen Reactions  . Fentanyl Other (See Comments)    urine retention  . Penicillins Itching and Rash     ROS  Please see history of present illness for complete description of ROS  Objective  Vitals:   08/20/16 1135  BP: 108/71  Pulse: 60  Resp: 16  Temp: 97.7 F (36.5 C)  TempSrc: Oral  SpO2: 93%    Physical Exam  Constitutional: She is oriented to person, place, and time and well-developed, well-nourished, and in no distress.  Sitting in wheelchair  HENT:  Head: Normocephalic.  Cardiovascular: Normal rate, regular rhythm and normal heart sounds.   No murmur heard. Pulmonary/Chest: No respiratory distress. She has no decreased breath sounds. She has no wheezes. She has no rhonchi.  Musculoskeletal:  Left Above-knee amputation, stump normal in appearance.  Neurological: She is alert and oriented to person, place, and time.  Psychiatric: Mood, affect and judgment normal.  Nursing note and vitals reviewed.    Assessment & Plan  1. Anemia, unspecified type Being followed by hematology, increase iron sulfate to twice daily, prescription provided - ferrous sulfate 325 (65 FE) MG tablet; Take 1 tablet (325 mg total) by mouth 2 (two) times daily with a meal.  Dispense: 180 tablet; Refill:  0  2. Phantom limb syndrome with pain (HCC) Stable, responsive to hydrocodone taken up to 3 times daily when needed, patient compliant with controlled substance agreement, refills provided - HYDROcodone-acetaminophen (NORCO) 7.5-325 MG tablet; Take 1 tablet by mouth 3 (three) times daily as needed.  Dispense: 90 tablet; Refill: 0   Rylei Masella Asad A. [place]  [facility] 08/20/2016 12:07 PM

## 2016-08-21 DIAGNOSIS — S31125A Laceration of abdominal wall with foreign body, periumbilic region without penetration into peritoneal cavity, initial encounter: Secondary | ICD-10-CM | POA: Diagnosis not present

## 2016-08-21 DIAGNOSIS — Z89612 Acquired absence of left leg above knee: Secondary | ICD-10-CM | POA: Diagnosis not present

## 2016-09-02 ENCOUNTER — Other Ambulatory Visit: Payer: Self-pay | Admitting: Family Medicine

## 2016-09-02 DIAGNOSIS — K219 Gastro-esophageal reflux disease without esophagitis: Secondary | ICD-10-CM

## 2016-09-02 DIAGNOSIS — G546 Phantom limb syndrome with pain: Secondary | ICD-10-CM

## 2016-09-07 ENCOUNTER — Inpatient Hospital Stay: Payer: Medicare HMO | Attending: Internal Medicine

## 2016-09-07 ENCOUNTER — Telehealth: Payer: Self-pay | Admitting: Emergency Medicine

## 2016-09-07 DIAGNOSIS — K219 Gastro-esophageal reflux disease without esophagitis: Secondary | ICD-10-CM | POA: Diagnosis not present

## 2016-09-07 DIAGNOSIS — I129 Hypertensive chronic kidney disease with stage 1 through stage 4 chronic kidney disease, or unspecified chronic kidney disease: Secondary | ICD-10-CM | POA: Insufficient documentation

## 2016-09-07 DIAGNOSIS — Z85528 Personal history of other malignant neoplasm of kidney: Secondary | ICD-10-CM | POA: Insufficient documentation

## 2016-09-07 DIAGNOSIS — Z905 Acquired absence of kidney: Secondary | ICD-10-CM | POA: Diagnosis not present

## 2016-09-07 DIAGNOSIS — Z800 Family history of malignant neoplasm of digestive organs: Secondary | ICD-10-CM | POA: Diagnosis not present

## 2016-09-07 DIAGNOSIS — D509 Iron deficiency anemia, unspecified: Secondary | ICD-10-CM | POA: Diagnosis not present

## 2016-09-07 DIAGNOSIS — Z79899 Other long term (current) drug therapy: Secondary | ICD-10-CM | POA: Diagnosis not present

## 2016-09-07 DIAGNOSIS — Z86718 Personal history of other venous thrombosis and embolism: Secondary | ICD-10-CM | POA: Insufficient documentation

## 2016-09-07 DIAGNOSIS — G546 Phantom limb syndrome with pain: Secondary | ICD-10-CM | POA: Diagnosis not present

## 2016-09-07 DIAGNOSIS — I700 Atherosclerosis of aorta: Secondary | ICD-10-CM | POA: Insufficient documentation

## 2016-09-07 DIAGNOSIS — E1151 Type 2 diabetes mellitus with diabetic peripheral angiopathy without gangrene: Secondary | ICD-10-CM | POA: Insufficient documentation

## 2016-09-07 DIAGNOSIS — I2510 Atherosclerotic heart disease of native coronary artery without angina pectoris: Secondary | ICD-10-CM | POA: Insufficient documentation

## 2016-09-07 DIAGNOSIS — Z880 Allergy status to penicillin: Secondary | ICD-10-CM | POA: Diagnosis not present

## 2016-09-07 DIAGNOSIS — N189 Chronic kidney disease, unspecified: Secondary | ICD-10-CM | POA: Insufficient documentation

## 2016-09-07 DIAGNOSIS — Z89612 Acquired absence of left leg above knee: Secondary | ICD-10-CM | POA: Insufficient documentation

## 2016-09-07 DIAGNOSIS — E785 Hyperlipidemia, unspecified: Secondary | ICD-10-CM | POA: Diagnosis not present

## 2016-09-07 DIAGNOSIS — E1122 Type 2 diabetes mellitus with diabetic chronic kidney disease: Secondary | ICD-10-CM | POA: Insufficient documentation

## 2016-09-07 DIAGNOSIS — Z809 Family history of malignant neoplasm, unspecified: Secondary | ICD-10-CM | POA: Diagnosis not present

## 2016-09-07 DIAGNOSIS — Z87891 Personal history of nicotine dependence: Secondary | ICD-10-CM | POA: Insufficient documentation

## 2016-09-07 DIAGNOSIS — Z87440 Personal history of urinary (tract) infections: Secondary | ICD-10-CM | POA: Diagnosis not present

## 2016-09-07 DIAGNOSIS — K439 Ventral hernia without obstruction or gangrene: Secondary | ICD-10-CM | POA: Diagnosis not present

## 2016-09-07 DIAGNOSIS — D49519 Neoplasm of unspecified behavior of unspecified kidney: Secondary | ICD-10-CM

## 2016-09-07 LAB — CBC WITH DIFFERENTIAL/PLATELET
Basophils Absolute: 0.1 10*3/uL (ref 0–0.1)
Basophils Relative: 1 %
Eosinophils Absolute: 0.4 10*3/uL (ref 0–0.7)
Eosinophils Relative: 5 %
HCT: 35.1 % (ref 35.0–47.0)
Hemoglobin: 11.5 g/dL — ABNORMAL LOW (ref 12.0–16.0)
Lymphocytes Relative: 29 %
Lymphs Abs: 2.5 10*3/uL (ref 1.0–3.6)
MCH: 29.7 pg (ref 26.0–34.0)
MCHC: 32.7 g/dL (ref 32.0–36.0)
MCV: 90.7 fL (ref 80.0–100.0)
Monocytes Absolute: 0.7 10*3/uL (ref 0.2–0.9)
Monocytes Relative: 9 %
Neutro Abs: 4.8 10*3/uL (ref 1.4–6.5)
Neutrophils Relative %: 56 %
Platelets: 171 10*3/uL (ref 150–440)
RBC: 3.87 MIL/uL (ref 3.80–5.20)
RDW: 17.9 % — ABNORMAL HIGH (ref 11.5–14.5)
WBC: 8.5 10*3/uL (ref 3.6–11.0)

## 2016-09-07 LAB — RETICULOCYTES
RBC.: 3.8 MIL/uL (ref 3.80–5.20)
Retic Count, Absolute: 72.2 10*3/uL (ref 19.0–183.0)
Retic Ct Pct: 1.9 % (ref 0.4–3.1)

## 2016-09-07 LAB — FERRITIN: Ferritin: 22 ng/mL (ref 11–307)

## 2016-09-07 NOTE — Telephone Encounter (Signed)
Mr. Dever called and stated that Amy [name] is having sever left foot pain.  Taking Gabapentin and Hydrocodone. Nothing is working for her. She is complaining so much about the pain. What can he do. Please call.

## 2016-09-07 NOTE — Telephone Encounter (Signed)
Return call and patient's husband states that she is having intermittent episodes of severe pain in the region of her left lower limb, patient is status post left above-knee amputation, she is on gabapentin and hydrocodone for phantom limb pain, he has given her one extra gabapentin which has not helped. I have advised him to bring patient in tomorrow afternoon at 4 PM for an appointment for evaluation. Patient's husband verbalized understanding and will call tomorrow morning for an appointment.

## 2016-09-08 ENCOUNTER — Encounter: Payer: Self-pay | Admitting: Hematology and Oncology

## 2016-09-08 ENCOUNTER — Inpatient Hospital Stay (HOSPITAL_BASED_OUTPATIENT_CLINIC_OR_DEPARTMENT_OTHER): Payer: Medicare HMO | Admitting: Hematology and Oncology

## 2016-09-08 ENCOUNTER — Ambulatory Visit (INDEPENDENT_AMBULATORY_CARE_PROVIDER_SITE_OTHER): Payer: Commercial Managed Care - HMO | Admitting: Family Medicine

## 2016-09-08 ENCOUNTER — Encounter: Payer: Self-pay | Admitting: Family Medicine

## 2016-09-08 VITALS — BP 112/78 | HR 60 | Temp 98.3°F | Resp 16

## 2016-09-08 VITALS — BP 145/67 | HR 58 | Temp 98.4°F | Resp 18 | Wt 155.0 lb

## 2016-09-08 DIAGNOSIS — Z129 Encounter for screening for malignant neoplasm, site unspecified: Secondary | ICD-10-CM

## 2016-09-08 DIAGNOSIS — N189 Chronic kidney disease, unspecified: Secondary | ICD-10-CM

## 2016-09-08 DIAGNOSIS — E1122 Type 2 diabetes mellitus with diabetic chronic kidney disease: Secondary | ICD-10-CM

## 2016-09-08 DIAGNOSIS — E785 Hyperlipidemia, unspecified: Secondary | ICD-10-CM | POA: Diagnosis not present

## 2016-09-08 DIAGNOSIS — I2510 Atherosclerotic heart disease of native coronary artery without angina pectoris: Secondary | ICD-10-CM

## 2016-09-08 DIAGNOSIS — I700 Atherosclerosis of aorta: Secondary | ICD-10-CM

## 2016-09-08 DIAGNOSIS — K439 Ventral hernia without obstruction or gangrene: Secondary | ICD-10-CM | POA: Diagnosis not present

## 2016-09-08 DIAGNOSIS — Z85528 Personal history of other malignant neoplasm of kidney: Secondary | ICD-10-CM

## 2016-09-08 DIAGNOSIS — G546 Phantom limb syndrome with pain: Secondary | ICD-10-CM | POA: Diagnosis not present

## 2016-09-08 DIAGNOSIS — Z87891 Personal history of nicotine dependence: Secondary | ICD-10-CM

## 2016-09-08 DIAGNOSIS — D509 Iron deficiency anemia, unspecified: Secondary | ICD-10-CM | POA: Diagnosis not present

## 2016-09-08 DIAGNOSIS — I129 Hypertensive chronic kidney disease with stage 1 through stage 4 chronic kidney disease, or unspecified chronic kidney disease: Secondary | ICD-10-CM | POA: Diagnosis not present

## 2016-09-08 DIAGNOSIS — K219 Gastro-esophageal reflux disease without esophagitis: Secondary | ICD-10-CM | POA: Diagnosis not present

## 2016-09-08 DIAGNOSIS — E1151 Type 2 diabetes mellitus with diabetic peripheral angiopathy without gangrene: Secondary | ICD-10-CM

## 2016-09-08 DIAGNOSIS — Z905 Acquired absence of kidney: Secondary | ICD-10-CM | POA: Diagnosis not present

## 2016-09-08 DIAGNOSIS — Z89612 Acquired absence of left leg above knee: Secondary | ICD-10-CM

## 2016-09-08 DIAGNOSIS — Z880 Allergy status to penicillin: Secondary | ICD-10-CM

## 2016-09-08 DIAGNOSIS — D49519 Neoplasm of unspecified behavior of unspecified kidney: Secondary | ICD-10-CM

## 2016-09-08 DIAGNOSIS — Z79899 Other long term (current) drug therapy: Secondary | ICD-10-CM

## 2016-09-08 NOTE — Progress Notes (Signed)
[facility] day:  09/08/2016  Chief Complaint: Amy [name] is a 81 y.o. female with iron deficiency anemia and a history of stage III renal cell carcinoma who is seen for review of restaging studies and 1 month assessment on oral iron.  HPI:   The patient was last seen in the medical oncology clinic on 08/11/2016.  At that time, her appetite was poor. She had lost an unspecified amount of weight.  Ferritin was 18.  Hemoglobin had improved on oral iron. We discussed continuation of oral iron.  As it had been almost 2 years since she had restaging studies following her diagnosis of renal cell carcinoma, studies were ordered.  Chest, abdomen, and pelvic CT scan on 08/16/2016 revealed left nephrectomy without evidence of metastatic disease.  There were low-attenuation lesions in the right kidney, likely cysts although definitive characterization is limited without post-contrast imaging.  There was an ectatic abdominal aorta at risk for aneurysm development.  Recommend followup by ultrasound in 5 years.  There was aortic atherosclerosis and coronary artery calcification.  There was air in the bladder presumably iatrogenic.  There was supraumbilical midline ventral hernias contain fat.  During the interim, she has done "okay". She only states that her left leg bothers her (aches) secondary to phantom pain. She notes black stools on oral iron. She has been taking 2 pills a day since her last visit.  She is eating well, although she states that very seldom does she eat breakfast.   Past Medical History:  Diagnosis Date  . Anxiety   . Bilateral cataracts   . Bronchitis    hx of   . Cancer of kidney, left ([place]) 2017  . Cataract   . Chronic kidney disease   . Depression   . Diabetes mellitus without complication ([place])   . Dry cough   . 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 GERD (gastroesophageal reflux  disease)   . History of frequent urinary tract infections   . Hyperlipidemia   . Hypertension   . Left renal mass   . Numbness and tingling    right hand   . Obesity   . Peripheral artery disease ([place])   . Urinary frequency   . Urinary incontinence     Past Surgical History:  Procedure Laterality Date  . ABDOMINAL HYSTERECTOMY  1978  . CARPAL TUNNEL RELEASE Bilateral   . CHOLECYSTECTOMY    . LEG AMPUTATION     above the knee / left   . ROBOT ASSISTED LAPAROSCOPIC NEPHRECTOMY Left 01/15/2015   Procedure: ROBOTIC ASSISTED LAPAROSCOPIC RADICAL NEPHRECTOMY;  Surgeon: Alexis Frock, MD;  Location: [facility];  Service: Urology;  Laterality: Left;  . stent placement right leg       Family History  Problem Relation Age of Onset  . Cancer Mother     Stomach  . Cancer Father   . Diabetes Brother   . Cancer Brother     Social History:  reports that she quit smoking about 12 years ago. Her smoking use included Cigarettes. She has a 76.50 pack-year smoking history. She has never used smokeless tobacco. She reports that she does not drink alcohol or use drugs.  She is an Therapist, sports.  She lives in [place].  The patient is accompanied by her husband today.  Allergies:  Allergies  Allergen Reactions  . Fentanyl Other (See Comments)  urine retention  . Penicillins Itching and Rash    Current Medications: Current Outpatient Prescriptions  Medication Sig Dispense Refill  . acetaminophen (TYLENOL) 500 MG tablet Take 500 mg by mouth every 6 (six) hours as needed. Pt takes 2 tablets as needed.    . ALPRAZolam (XANAX) 0.5 MG tablet Take 1 tablet (0.5 mg total) by mouth 3 (three) times daily as needed for anxiety. 90 tablet 0  . busPIRone (BUSPAR) 10 MG tablet Take 10 mg by mouth 2 (two) times daily.    . citalopram (CELEXA) 20 MG tablet TAKE 1 TABLET AT BEDTIME 90 tablet 0  . clopidogrel (PLAVIX) 75 MG tablet TAKE 1 TABLET EVERY DAY 90 tablet 0  . docusate sodium (COLACE) 100 MG capsule Take 100 mg  by mouth daily. Pt taking once a day with breakfast    . donepezil (ARICEPT) 5 MG tablet Take 5 mg by mouth at bedtime.    . ferrous sulfate 325 (65 FE) MG tablet Take 1 tablet (325 mg total) by mouth 2 (two) times daily with a meal. 180 tablet 0  . gabapentin (NEURONTIN) 300 MG capsule TAKE 1 CAPSULE (300 MG TOTAL) BY MOUTH 3 (THREE) TIMES DAILY. 270 capsule 1  . glucose blood test strip Use as instructed. 100 each 12  . HYDROcodone-acetaminophen (NORCO) 7.5-325 MG tablet Take 1 tablet by mouth 3 (three) times daily as needed. 90 tablet 0  . metoprolol (LOPRESSOR) 50 MG tablet TAKE 1 TABLET TWICE DAILY 180 tablet 0  . Multiple Vitamins-Minerals (HAIR SKIN AND NAILS FORMULA) TABS Take by mouth. One tablet daily    . pantoprazole (PROTONIX) 40 MG tablet TAKE 1 TABLET (40 MG TOTAL) BY MOUTH DAILY. 90 tablet 1  . QUEtiapine (SEROQUEL) 25 MG tablet Take 25 mg by mouth at bedtime. Then increase to 2 qhs    . QUEtiapine (SEROQUEL) 50 MG tablet Take 50 mg by mouth.    . ranitidine (ZANTAC) 150 MG capsule TAKE 1 CAPSULE AT BEDTIME 90 capsule 1  . simvastatin (ZOCOR) 40 MG tablet TAKE 1 TABLET EVERY DAY 90 tablet 0  . VENTOLIN HFA 108 (90 BASE) MCG/ACT inhaler Inhale 1-2 puffs into the lungs every 4 (four) hours as needed for wheezing or shortness of breath.     . Vitamin D, Ergocalciferol, (DRISDOL) 50000 UNITS CAPS capsule      No current facility-administered medications for this visit.     Review of Systems:  GENERAL:  Feels "ok".  No fevers or sweats.  Weight up 5 pounds. PERFORMANCE STATUS (ECOG):  2-3 HEENT:  No visual changes, runny nose, sore throat, mouth sores or tenderness. Lungs: No shortness of breath or cough.  No hemoptysis. Cardiac:  No chest pain, palpitations, orthopnea, or PND. GI:  Black stool on iron.  No nausea, vomiting, diarrhea, constipation, or hematochezia. GU:  No urgency, frequency, dysuria, or hematuria. Musculoskeletal:  Left arm hurts on awakening.  Right 3rd finger  hurts (no change).  No back pain.  No joint pain.  No muscle tenderness. Extremities:  No pain or swelling. Skin:  No rashes or skin changes. Neuro:  No headache, numbness or weakness, balance or coordination issues. Endocrine:  No diabetes, thyroid issues, hot flashes or night sweats. Psych:  No mood changes, depression or anxiety. Pain:  No focal pain. Review of systems:  All other systems reviewed and found to be negative.  Physical Exam: Blood pressure (!) 145/67, pulse (!) 58, temperature 98.4 F (36.9 C), temperature source Tympanic, resp.  rate 18, weight 155 lb (70.3 kg). GENERAL:  Well developed, well nourished, woman sitting comfortably in a wheelchair in the exam room in no acute distress. MENTAL STATUS:  Alert and oriented to person, place and time. HEAD:  Graying hair.  Head tilt to the left.  Normocephalic, atraumatic, face symmetric, no Cushingoid features. EYES:  Glasses.  Brown eyes.  Pupils equal round and reactive to light and accomodation.  No conjunctivitis or scleral icterus. ENT:  Oropharynx clear without lesion.  Tongue normal. Mucous membranes moist.  RESPIRATORY:  Clear to auscultation without rales, wheezes or rhonchi. CARDIOVASCULAR:  Regular rate and rhythm without murmur, rub or gallop. ABDOMEN:  Soft, non-tender, with active bowel sounds, and no hepatosplenomegaly.  No masses. SKIN:  No rashes, ulcers or lesions. EXTREMITIES: Left sided above the knee amputation.  No edema, no skin discoloration or tenderness.  No palpable cords. LYMPH NODES: No palpable cervical, supraclavicular, axillary or inguinal adenopathy  NEUROLOGICAL: Unremarkable. PSYCH:  Appropriate.   Appointment on 09/07/2016  Component Date Value Ref Range Status  . WBC 09/07/2016 8.5  3.6 - 11.0 K/uL Final  . RBC 09/07/2016 3.87  3.80 - 5.20 MIL/uL Final  . Hemoglobin 09/07/2016 11.5* 12.0 - 16.0 g/dL Final  . HCT 09/07/2016 35.1  35.0 - 47.0 % Final  . MCV 09/07/2016 90.7  80.0 - 100.0 fL  Final  . MCH 09/07/2016 29.7  26.0 - 34.0 pg Final  . MCHC 09/07/2016 32.7  32.0 - 36.0 g/dL Final  . RDW 09/07/2016 17.9* 11.5 - 14.5 % Final  . Platelets 09/07/2016 171  150 - 440 K/uL Final  . Neutrophils Relative % 09/07/2016 56  % Final  . Neutro Abs 09/07/2016 4.8  1.4 - 6.5 K/uL Final  . Lymphocytes Relative 09/07/2016 29  % Final  . Lymphs Abs 09/07/2016 2.5  1.0 - 3.6 K/uL Final  . Monocytes Relative 09/07/2016 9  % Final  . Monocytes Absolute 09/07/2016 0.7  0.2 - 0.9 K/uL Final  . Eosinophils Relative 09/07/2016 5  % Final  . Eosinophils Absolute 09/07/2016 0.4  0 - 0.7 K/uL Final  . Basophils Relative 09/07/2016 1  % Final  . Basophils Absolute 09/07/2016 0.1  0 - 0.1 K/uL Final  . Ferritin 09/07/2016 22  11 - 307 ng/mL Final  . Retic Ct Pct 09/07/2016 1.9  0.4 - 3.1 % Final  . RBC. 09/07/2016 3.80  3.80 - 5.20 MIL/uL Final  . Retic Count, Manual 09/07/2016 72.2  19.0 - 183.0 K/uL Final    Assessment:  Amy [name] is a 81 y.o. female with mild iron deficiency anemia.  EGD and colonoscopy on 10/10/2013 were negative per patient.  She has had reflux for the past 15-30 years.  She is on Prilosec.  Diet is poor.  She denies any pica.  Work-up on 07/14/2016 revealed a hematocrit of 35.5, hemoglobin 11.6, MCV 87.6, platelets 173,000, WBC 7900 with an ANC of 5000.  SPEP revealed no monoclonal protein.  Ferritin was 22 (low).  Folate was 9.8.  Guaiac cards were negative x 3.  She is on oral iron.  She has a history of stage III renal cell carcinoma s/p robotic-assisted laparoscopic left radical nephrectomy on 01/09/2015. Pathology revealed a 6.3 cm renal cell carcinoma.  Tumor was predominantly clear cell with a minor component of type I papillary.  Fuhrman grade 3 of 4.  Tumor extended into the renal vein.  There was focal extension into the perirenal and sinus adipose tissue.  Margins were negative.  There was metastatic tumor in 2 of 2 lymph nodes.  Pathologic stage was  [id number].    Abdomen and pelvic CT scan on 11/29/2014 revealed a 5.5 x 3.7 solid enhancing mass arising from the upper pole of the left kidney.  There was evidence of renal vein invasion.  There were borderline enlarged periaortic lymph node (9 mm).   Chest, abdomen, and pelvic CT on 08/16/2016 revealed left nephrectomy without evidence of metastatic disease.  There were low-attenuation lesions in the right kidney, likely cysts although definitive characterization is limited without post-contrast imaging.  There was an ectatic abdominal aorta at risk for aneurysm development.   She has chronic renal insufficiency.  Creatinine was 1.86 (CrCl 26 ml/min) on 06/15/2016.  She has a history of left lower etxremity DVT.  She is s/p left AKA.  Symptomatically, she notes phantom leg pain.  She is eating better.  She is taking oral iron BID.  Ferritin is 22.  Plan: 1.  Review labs from yesterday.  Hematocrit and hemoglobin are normal.  Discuss continuation of oral iron. 2.  Review scans.  No evidence of renal cell carcinoma. 3.  Continue scheduled follow-up:  H&P every 3-6 months x 3 years then annually up to 5 years.  CMP every 6 months x 2 years then annually until year 5.  Chest, abdomen and pelvic CT scan every 3-6 months for 3 years then annually until year 5.  Bone scan if indicated.  Imaging after 5 years as indicated by exam or symptoms. 4.  Continue oral iron with OJ or vitamin C. 5.  RTC in 3 month for MD assessment and labs (CBC with diff, ferritin, iron studies, free light chains- day before).   Lequita Asal, MD  09/08/2016, 2:48 PM

## 2016-09-08 NOTE — Progress Notes (Signed)
Name: Amy [name]   MRN: [id number]    DOB: 06-14-1935   Date:09/08/2016       Progress Note  Subjective  Chief Complaint  Chief Complaint  Patient presents with  . Follow-up    Check foot (Left)    HPI  Patient presents for symptoms of phantom limb pain, worse over the last couple of days where she is complaining of the left foot (pt. Has left above knee amputation in 2007). She takes Hydrocodone and Gabapentin for the pain, her husband has given her 4 pills of gabapentin a day which has not helped. She describes the pain as sharp and burning, no exacerbating factors but maybe increased stress makes it worse.    Past Medical History:  Diagnosis Date  . Anxiety   . Bilateral cataracts   . Bronchitis    hx of   . Cancer of kidney, left ([place]) 2017  . Cataract   . Chronic kidney disease   . Depression   . Diabetes mellitus without complication ([place])   . Dry cough   . 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 GERD (gastroesophageal reflux disease)   . History of frequent urinary tract infections   . Hyperlipidemia   . Hypertension   . Left renal mass   . Numbness and tingling    right hand   . Obesity   . Peripheral artery disease ([place])   . Urinary frequency   . Urinary incontinence     Past Surgical History:  Procedure Laterality Date  . ABDOMINAL HYSTERECTOMY  1978  . CARPAL TUNNEL RELEASE Bilateral   . CHOLECYSTECTOMY    . LEG AMPUTATION     above the knee / left   . ROBOT ASSISTED LAPAROSCOPIC NEPHRECTOMY Left 01/15/2015   Procedure: ROBOTIC ASSISTED LAPAROSCOPIC RADICAL NEPHRECTOMY;  Surgeon: Alexis Frock, MD;  Location: [facility];  Service: Urology;  Laterality: Left;  . stent placement right leg       Family History  Problem Relation Age of Onset  . Cancer Mother     Stomach  . Cancer Father   . Diabetes Brother   . Cancer Brother     Social History   Social History  . Marital status: Married   Spouse name: N/A  . Number of children: N/A  . Years of education: N/A   Occupational History  . Not on file.   Social History Main Topics  . Smoking status: Former Smoker    Packs/day: 1.50    Years: 51.00    Types: Cigarettes    Quit date: 05/24/2004  . Smokeless tobacco: Never Used  . Alcohol use No  . Drug use: No  . Sexual activity: Not Currently   Other Topics Concern  . Not on file   Social History Narrative  . No narrative on file     Current Outpatient Prescriptions:  .  acetaminophen (TYLENOL) 500 MG tablet, Take 500 mg by mouth every 6 (six) hours as needed. Pt takes 2 tablets as needed., Disp: , Rfl:  .  ALPRAZolam (XANAX) 0.5 MG tablet, Take 1 tablet (0.5 mg total) by mouth 3 (three) times daily as needed for anxiety., Disp: 90 tablet, Rfl: 0 .  busPIRone (BUSPAR) 10 MG tablet, Take 10 mg by mouth 2 (two) times daily., Disp: , Rfl:  .  citalopram (CELEXA) 20 MG tablet, TAKE 1 TABLET AT BEDTIME, Disp: 90 tablet, Rfl: 0 .  clopidogrel (PLAVIX) 75 MG tablet, TAKE 1 TABLET EVERY DAY, Disp: 90 tablet, Rfl: 0 .  docusate sodium (COLACE) 100 MG capsule, Take 100 mg by mouth daily. Pt taking once a day with breakfast, Disp: , Rfl:  .  donepezil (ARICEPT) 5 MG tablet, Take 5 mg by mouth at bedtime., Disp: , Rfl:  .  ferrous sulfate 325 (65 FE) MG tablet, Take 1 tablet (325 mg total) by mouth 2 (two) times daily with a meal., Disp: 180 tablet, Rfl: 0 .  gabapentin (NEURONTIN) 300 MG capsule, TAKE 1 CAPSULE (300 MG TOTAL) BY MOUTH 3 (THREE) TIMES DAILY., Disp: 270 capsule, Rfl: 1 .  glucose blood test strip, Use as instructed., Disp: 100 each, Rfl: 12 .  HYDROcodone-acetaminophen (NORCO) 7.5-325 MG tablet, Take 1 tablet by mouth 3 (three) times daily as needed., Disp: 90 tablet, Rfl: 0 .  metoprolol (LOPRESSOR) 50 MG tablet, TAKE 1 TABLET TWICE DAILY, Disp: 180 tablet, Rfl: 0 .  Multiple Vitamins-Minerals (HAIR SKIN AND NAILS FORMULA) TABS, Take by mouth. One tablet daily,  Disp: , Rfl:  .  pantoprazole (PROTONIX) 40 MG tablet, TAKE 1 TABLET (40 MG TOTAL) BY MOUTH DAILY., Disp: 90 tablet, Rfl: 1 .  QUEtiapine (SEROQUEL) 25 MG tablet, Take 25 mg by mouth at bedtime. Then increase to 2 qhs, Disp: , Rfl:  .  QUEtiapine (SEROQUEL) 50 MG tablet, Take 50 mg by mouth., Disp: , Rfl:  .  ranitidine (ZANTAC) 150 MG capsule, TAKE 1 CAPSULE AT BEDTIME, Disp: 90 capsule, Rfl: 1 .  simvastatin (ZOCOR) 40 MG tablet, TAKE 1 TABLET EVERY DAY, Disp: 90 tablet, Rfl: 0 .  VENTOLIN HFA 108 (90 BASE) MCG/ACT inhaler, Inhale 1-2 puffs into the lungs every 4 (four) hours as needed for wheezing or shortness of breath. , Disp: , Rfl:  .  Vitamin D, Ergocalciferol, (DRISDOL) 50000 UNITS CAPS capsule, , Disp: , Rfl:   Allergies  Allergen Reactions  . Fentanyl Other (See Comments)    urine retention  . Penicillins Itching and Rash     ROS  Please see history of present illness for complete discussion of ROS  Objective  Vitals:   09/08/16 1542  BP: 112/78  Pulse: 60  Resp: 16  Temp: 98.3 F (36.8 C)  TempSrc: Oral  SpO2: 93%    Physical Exam  Constitutional: She is well-developed, well-nourished, and in no distress.  Cardiovascular: Normal rate, regular rhythm and normal heart sounds.   No murmur heard. Pulmonary/Chest: Effort normal and breath sounds normal. She has no wheezes.  Musculoskeletal:       Legs: Left Above knee amputation, stump is normal in appearance but has tenderness to palpation, no signs of infection or inflammation.  Nursing note and vitals reviewed.       Assessment & Plan  1. S/P AKA (above knee amputation) unilateral, left ([place])   2. 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 (HCC) We'll DC gabapentin via tapering off over 7 days, advised to call back if her pain is worse during the tapering off. And we would consider increasing hydrocodone to 10 mg, return in one week to start on Lyrica.   [facility] 09/08/2016 4:13 PM

## 2016-09-08 NOTE — Progress Notes (Signed)
Patient has had phantom pain over the past few days.  Otherwise no complaints.

## 2016-09-13 ENCOUNTER — Ambulatory Visit (INDEPENDENT_AMBULATORY_CARE_PROVIDER_SITE_OTHER): Payer: Commercial Managed Care - HMO

## 2016-09-13 ENCOUNTER — Ambulatory Visit: Payer: Commercial Managed Care - HMO | Admitting: Family Medicine

## 2016-09-13 VITALS — BP 118/46 | HR 54 | Temp 97.0°F | Ht 63.0 in | Wt 153.1 lb

## 2016-09-13 DIAGNOSIS — Z0000 Encounter for general adult medical examination without abnormal findings: Secondary | ICD-10-CM | POA: Diagnosis not present

## 2016-09-13 NOTE — Patient Instructions (Signed)
Amy [name] , Thank you for taking time to come for your Medicare Wellness Visit. I appreciate your ongoing commitment to your health goals. Please review the following plan we discussed and let me know if I can assist you in the future.   Screening recommendations/referrals: Colonoscopy: last done 09/26/13 Mammogram: last done 12/26/13 Bone Density: declined Recommended yearly ophthalmology/optometry visit for glaucoma screening and checkup Recommended yearly dental visit for hygiene and checkup  Vaccinations: Influenza vaccine: done 02/23/16 Pneumococcal vaccine: completed series Tdap vaccine: declined Shingles vaccine: declined    Advanced directives: declined  Conditions/risks identified: fall prevention  Next appointment: None   Preventive Care 79 Years and Older, Female Preventive care refers to lifestyle choices and visits with your health care provider that can promote health and wellness. What does preventive care include?  A yearly physical exam. This is also called an annual well check.  Dental exams once or twice a year.  Routine eye exams. Ask your health care provider how often you should have your eyes checked.  Personal lifestyle choices, including:  Daily care of your teeth and gums.  Regular physical activity.  Eating a healthy diet.  Avoiding tobacco and drug use.  Limiting alcohol use.  Practicing safe sex.  Taking low-dose aspirin every day.  Taking vitamin and mineral supplements as recommended by your health care provider. What happens during an annual well check? The services and screenings done by your health care provider during your annual well check will depend on your age, overall health, lifestyle risk factors, and family history of disease. Counseling  Your health care provider may ask you questions about your:  Alcohol use.  Tobacco use.  Drug use.  Emotional well-being.  Home and relationship well-being.  Sexual  activity.  Eating habits.  History of falls.  Memory and ability to understand (cognition).  Work and work Statistician.  Reproductive health. Screening  You may have the following tests or measurements:  Height, weight, and BMI.  Blood pressure.  Lipid and cholesterol levels. These may be checked every 5 years, or more frequently if you are over 32 years old.  Skin check.  Lung cancer screening. You may have this screening every year starting at age 61 if you have a 30-pack-year history of smoking and currently smoke or have quit within the past 15 years.  Fecal occult blood test (FOBT) of the stool. You may have this test every year starting at age 33.  Flexible sigmoidoscopy or colonoscopy. You may have a sigmoidoscopy every 5 years or a colonoscopy every 10 years starting at age 44.  Hepatitis C blood test.  Hepatitis B blood test.  Sexually transmitted disease (STD) testing.  Diabetes screening. This is done by checking your blood sugar (glucose) after you have not eaten for a while (fasting). You may have this done every 1-3 years.  Bone density scan. This is done to screen for osteoporosis. You may have this done starting at age 36.  Mammogram. This may be done every 1-2 years. Talk to your health care provider about how often you should have regular mammograms. Talk with your health care provider about your test results, treatment options, and if necessary, the need for more tests. Vaccines  Your health care provider may recommend certain vaccines, such as:  Influenza vaccine. This is recommended every year.  Tetanus, diphtheria, and acellular pertussis (Tdap, Td) vaccine. You may need a Td booster every 10 years.  Zoster vaccine. You may need this after age 13.  Pneumococcal 13-valent conjugate (PCV13) vaccine. One dose is recommended after age 83.  Pneumococcal polysaccharide (PPSV23) vaccine. One dose is recommended after age 48. Talk to your health care  provider about which screenings and vaccines you need and how often you need them. This information is not intended to replace advice given to you by your health care provider. Make sure you discuss any questions you have with your health care provider. Document Released: 06/06/2015 Document Revised: 01/28/2016 Document Reviewed: 03/11/2015 Elsevier Interactive Patient Education  2017 [place] Prevention in the Home Falls can cause injuries. They can happen to people of all ages. There are many things you can do to make your home safe and to help prevent falls. What can I do on the outside of my home?  Regularly fix the edges of walkways and driveways and fix any cracks.  Remove anything that might make you trip as you walk through a door, such as a raised step or threshold.  Trim any bushes or trees on the path to your home.  Use bright outdoor lighting.  Clear any walking paths of anything that might make someone trip, such as rocks or tools.  Regularly check to see if handrails are loose or broken. Make sure that both sides of any steps have handrails.  Any raised decks and porches should have guardrails on the edges.  Have any leaves, snow, or ice cleared regularly.  Use sand or salt on walking paths during winter.  Clean up any spills in your garage right away. This includes oil or grease spills. What can I do in the bathroom?  Use night lights.  Install grab bars by the toilet and in the tub and shower. Do not use towel bars as grab bars.  Use non-skid mats or decals in the tub or shower.  If you need to sit down in the shower, use a plastic, non-slip stool.  Keep the floor dry. Clean up any water that spills on the floor as soon as it happens.  Remove soap buildup in the tub or shower regularly.  Attach bath mats securely with double-sided non-slip rug tape.  Do not have throw rugs and other things on the floor that can make you trip. What can I do in  the bedroom?  Use night lights.  Make sure that you have a light by your bed that is easy to reach.  Do not use any sheets or blankets that are too big for your bed. They should not hang down onto the floor.  Have a firm chair that has side arms. You can use this for support while you get dressed.  Do not have throw rugs and other things on the floor that can make you trip. What can I do in the kitchen?  Clean up any spills right away.  Avoid walking on wet floors.  Keep items that you use a lot in easy-to-reach places.  If you need to reach something above you, use a strong step stool that has a grab bar.  Keep electrical cords out of the way.  Do not use floor polish or wax that makes floors slippery. If you must use wax, use non-skid floor wax.  Do not have throw rugs and other things on the floor that can make you trip. What can I do with my stairs?  Do not leave any items on the stairs.  Make sure that there are handrails on both sides of the stairs and use them.  Fix handrails that are broken or loose. Make sure that handrails are as long as the stairways.  Check any carpeting to make sure that it is firmly attached to the stairs. Fix any carpet that is loose or worn.  Avoid having throw rugs at the top or bottom of the stairs. If you do have throw rugs, attach them to the floor with carpet tape.  Make sure that you have a light switch at the top of the stairs and the bottom of the stairs. If you do not have them, ask someone to add them for you. What else can I do to help prevent falls?  Wear shoes that:  Do not have high heels.  Have rubber bottoms.  Are comfortable and fit you well.  Are closed at the toe. Do not wear sandals.  If you use a stepladder:  Make sure that it is fully opened. Do not climb a closed stepladder.  Make sure that both sides of the stepladder are locked into place.  Ask someone to hold it for you, if possible.  Clearly mark and  make sure that you can see:  Any grab bars or handrails.  First and last steps.  Where the edge of each step is.  Use tools that help you move around (mobility aids) if they are needed. These include:  Canes.  Walkers.  Scooters.  Crutches.  Turn on the lights when you go into a dark area. Replace any light bulbs as soon as they burn out.  Set up your furniture so you have a clear path. Avoid moving your furniture around.  If any of your floors are uneven, fix them.  If there are any pets around you, be aware of where they are.  Review your medicines with your doctor. Some medicines can make you feel dizzy. This can increase your chance of falling. Ask your doctor what other things that you can do to help prevent falls. This information is not intended to replace advice given to you by your health care provider. Make sure you discuss any questions you have with your health care provider. Document Released: 03/06/2009 Document Revised: 10/16/2015 Document Reviewed: 06/14/2014 Elsevier Interactive Patient Education  2017 [employer].

## 2016-09-13 NOTE — Progress Notes (Signed)
Subjective:   Amy [name] is a 81 y.o. female who presents for Medicare Annual (Subsequent) preventive examination.  Review of Systems:  N/A Cardiac Risk Factors include: advanced age (>62men, >17 women);diabetes mellitus     Objective:     Vitals: BP (!) 118/46 (BP Location: Right Arm)   Pulse (!) 54   Temp 97 F (36.1 C) (Oral)   Ht 5\' 3"  (1.6 m)   Wt 153 lb 1.6 oz (69.4 kg)   BMI 27.12 kg/m   Body mass index is 27.12 kg/m.   Tobacco History  Smoking Status  . Former Smoker  . Packs/day: 1.50  . Years: 51.00  . Types: Cigarettes  . Quit date: 05/24/2004  Smokeless Tobacco  . Never Used     Counseling given: Not Answered   Past Medical History:  Diagnosis Date  . Anxiety   . Bilateral cataracts   . Bronchitis    hx of   . Cancer of kidney, left ([place]) 2017  . Cataract   . Chronic kidney disease   . Depression   . Diabetes mellitus without complication ([place])   . Dry cough   . 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 GERD (gastroesophageal reflux disease)   . History of frequent urinary tract infections   . Hyperlipidemia   . Hypertension   . Left renal mass   . Numbness and tingling    right hand   . Obesity   . Peripheral artery disease ([place])   . Urinary frequency   . Urinary incontinence    Past Surgical History:  Procedure Laterality Date  . ABDOMINAL HYSTERECTOMY  1978  . CARPAL TUNNEL RELEASE Bilateral   . CHOLECYSTECTOMY    . LEG AMPUTATION     above the knee / left   . ROBOT ASSISTED LAPAROSCOPIC NEPHRECTOMY Left 01/15/2015   Procedure: ROBOTIC ASSISTED LAPAROSCOPIC RADICAL NEPHRECTOMY;  Surgeon: Alexis Frock, MD;  Location: [facility];  Service: Urology;  Laterality: Left;  . stent placement right leg      Family History  Problem Relation Age of Onset  . Cancer Mother     Stomach  . Cancer Father   . Diabetes Brother   . Cancer Brother    History  Sexual Activity  . Sexual  activity: Not Currently    Outpatient Encounter Prescriptions as of 09/13/2016  Medication Sig  . acetaminophen (TYLENOL) 500 MG tablet Take 500 mg by mouth every 6 (six) hours as needed. Pt takes 2 tablets as needed.  . ALPRAZolam (XANAX) 0.5 MG tablet Take 1 tablet (0.5 mg total) by mouth 3 (three) times daily as needed for anxiety.  . busPIRone (BUSPAR) 10 MG tablet Take 10 mg by mouth 2 (two) times daily.  . citalopram (CELEXA) 20 MG tablet TAKE 1 TABLET AT BEDTIME  . clopidogrel (PLAVIX) 75 MG tablet TAKE 1 TABLET EVERY DAY  . docusate sodium (COLACE) 100 MG capsule Take 100 mg by mouth daily. Pt taking once a day with breakfast  . donepezil (ARICEPT) 5 MG tablet Take 5 mg by mouth at bedtime.  . ferrous sulfate 325 (65 FE) MG tablet Take 1 tablet (325 mg total) by mouth 2 (two) times daily with a meal.  . gabapentin (NEURONTIN) 300 MG capsule TAKE 1 CAPSULE (300 MG TOTAL) BY MOUTH 3 (THREE) TIMES DAILY.  [name] glucose blood test strip Use as instructed.  [name] HYDROcodone-acetaminophen (NORCO) 7.5-325  MG tablet Take 1 tablet by mouth 3 (three) times daily as needed.  . metoprolol (LOPRESSOR) 50 MG tablet TAKE 1 TABLET TWICE DAILY  . Multiple Vitamins-Minerals (HAIR SKIN AND NAILS FORMULA) TABS Take by mouth. One tablet daily  . pantoprazole (PROTONIX) 40 MG tablet TAKE 1 TABLET (40 MG TOTAL) BY MOUTH DAILY.  [name] QUEtiapine (SEROQUEL) 25 MG tablet Take 25 mg by mouth at bedtime. Then increase to 2 qhs  . QUEtiapine (SEROQUEL) 50 MG tablet Take 50 mg by mouth.  . ranitidine (ZANTAC) 150 MG capsule TAKE 1 CAPSULE AT BEDTIME  . simvastatin (ZOCOR) 40 MG tablet TAKE 1 TABLET EVERY DAY  . VENTOLIN HFA 108 (90 BASE) MCG/ACT inhaler Inhale 1-2 puffs into the lungs every 4 (four) hours as needed for wheezing or shortness of breath.   . Vitamin D, Ergocalciferol, (DRISDOL) 50000 UNITS CAPS capsule    No facility-administered encounter medications on file as of 09/13/2016.     Activities of Daily  Living In your present state of health, do you have any difficulty performing the following activities: 09/13/2016 09/08/2016  Hearing? Y N  Vision? N Y  Difficulty concentrating or making decisions? [name]  Walking or climbing stairs? Y Y  Dressing or bathing? Y N  Doing errands, shopping? Y N  Preparing Food and eating ? Y -  Using the Toilet? N -  In the past six months, have you accidently leaked urine? N -  Do you have problems with loss of bowel control? N -  Managing your Medications? Y -  Managing your Finances? Y -  Housekeeping or managing your Housekeeping? Y -  Some recent data might be hidden    Patient Care Team: Roselee Nova, MD as PCP - General (Family Medicine) Lequita Asal, MD as Referring Physician (Hematology and Oncology) Lang Snow, NP as Nurse Practitioner (Neurology) Birder Robson, MD as Referring Physician (Ophthalmology) Algernon Huxley, MD as Referring Physician (Vascular Surgery)    Assessment:     Exercise Activities and Dietary recommendations Current Exercise Habits: The patient does not participate in regular exercise at present, Exercise limited by: orthopedic condition(s)  Goals    . Increase water intake          Recommend increasing water intake to 4 glasses of water a day.      Fall Risk Fall Risk  09/13/2016 09/08/2016 08/20/2016 06/23/2016 06/15/2016  Falls in the past year? Yes No No No No  Number falls in past yr: 2 or more - - - -  Injury with Fall? No - - - -  Risk Factor Category  - - - - -  Risk for fall due to : Impaired mobility;Impaired balance/gait - - - -  Follow up Falls prevention discussed - - - -   Depression Screen PHQ 2/9 Scores 09/13/2016 09/08/2016 08/20/2016 06/23/2016  PHQ - 2 Score 1 0 0 0  PHQ- 9 Score - - - -  Exception Documentation - - - -  Not completed - - - -     Cognitive Function     6CIT Screen 09/13/2016  What Year? 0 points  What month? 0 points  What time? 3 points  Count back  from 20 0 points  Months in reverse 4 points  Repeat phrase 4 points  Total Score 11    Immunization History  Administered Date(s) Administered  . Influenza, High Dose Seasonal PF 01/29/2015, 02/23/2016  . Influenza-Unspecified 02/11/2014  . Pneumococcal  Conjugate-13 04/25/2014  . Pneumococcal Polysaccharide-23 05/24/2008  . Zoster 11/19/2008   Screening Tests Health Maintenance  Topic Date Due  . URINE MICROALBUMIN  08/26/2016  . HEMOGLOBIN A1C  12/04/2016 (Originally 04/27/2016)  . OPHTHALMOLOGY EXAM  08/22/2017 (Originally 01/01/1946)  . DEXA SCAN  05/24/2026 (Originally 01/01/2003)  . TETANUS/TDAP  05/24/2026 (Originally 12/31/1956)  . INFLUENZA VACCINE  12/22/2016  . FOOT EXAM  05/24/2017  . PNA vac Low Risk Adult  Completed      Plan:  I have personally reviewed and addressed the Medicare Annual Wellness questionnaire and have noted the following in the patient's chart:  A. Medical and social history B. Use of alcohol, tobacco or illicit drugs  C. Current medications and supplements D. Functional ability and status E.  Nutritional status F.  Physical activity G. Advance directives H. List of other physicians I.  Hospitalizations, surgeries, and ER visits in previous 12 months J.  [place] such as hearing and vision if needed, cognitive and depression L. Referrals and appointments - none  In addition, I have reviewed and discussed with patient certain preventive protocols, quality metrics, and best practice recommendations. A written personalized care plan for preventive services as well as general preventive health recommendations were provided to patient.  See attached scanned questionnaire for additional information.   Signed,  Fabio Neighbors, LPN Nurse Health Advisor   MD Recommendations: Pt declined DEXA scan and tetanus vaccine today. Pt it not scheduled to have an eye exam this year and wants to go only PRN. Pt needs Hgb A1c and next visit.     I, as supervising physician, have reviewed the nurse health advisor's Medicare Wellness Visit note for this patient and concur with the findings and recommendations listed above.  Signed Syed Asad A. Manuella Ghazi MD Attending Physician.

## 2016-09-15 ENCOUNTER — Encounter: Payer: Self-pay | Admitting: Family Medicine

## 2016-09-15 ENCOUNTER — Ambulatory Visit (INDEPENDENT_AMBULATORY_CARE_PROVIDER_SITE_OTHER): Payer: Commercial Managed Care - HMO | Admitting: Family Medicine

## 2016-09-15 VITALS — BP 115/73 | HR 60 | Temp 97.9°F | Resp 16

## 2016-09-15 DIAGNOSIS — G546 Phantom limb syndrome with pain: Secondary | ICD-10-CM

## 2016-09-15 MED ORDER — PREGABALIN 50 MG PO CAPS: 50.0000 mg | ORAL_CAPSULE | Freq: Three times a day (TID) | ORAL | 0 refills | Status: DC

## 2016-09-15 NOTE — Progress Notes (Signed)
Name: Amy [name]   MRN: [id number]    DOB: Feb 06, 1936   Date:09/15/2016       Progress Note  Subjective  Chief Complaint  Chief Complaint  Patient presents with  . Medication Problem    HPI  Pt. Presents to discontinue Gabapentin and replace with Lyrica for Phantom limb pain following amputation of left leg in 2007. She has pain described as burning sensation in the 'amputated leg' . She has tapered down and discontinued Gabapentin, will start on Lyrica.   Past Medical History:  Diagnosis Date  . Anxiety   . Bilateral cataracts   . Bronchitis    hx of   . Cancer of kidney, left ([place]) 2017  . Cataract   . Chronic kidney disease   . Depression   . Diabetes mellitus without complication ([place])   . Dry cough   . 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 GERD (gastroesophageal reflux disease)   . History of frequent urinary tract infections   . Hyperlipidemia   . Hypertension   . Left renal mass   . Numbness and tingling    right hand   . Obesity   . Peripheral artery disease ([place])   . Urinary frequency   . Urinary incontinence     Past Surgical History:  Procedure Laterality Date  . ABDOMINAL HYSTERECTOMY  1978  . CARPAL TUNNEL RELEASE Bilateral   . CHOLECYSTECTOMY    . LEG AMPUTATION     above the knee / left   . ROBOT ASSISTED LAPAROSCOPIC NEPHRECTOMY Left 01/15/2015   Procedure: ROBOTIC ASSISTED LAPAROSCOPIC RADICAL NEPHRECTOMY;  Surgeon: Alexis Frock, MD;  Location: [facility];  Service: Urology;  Laterality: Left;  . stent placement right leg       Family History  Problem Relation Age of Onset  . Cancer Mother     Stomach  . Cancer Father   . Diabetes Brother   . Cancer Brother     Social History   Social History  . Marital status: Married    Spouse name: N/A  . Number of children: N/A  . Years of education: N/A   Occupational History  . Not on file.   Social History Main Topics  . Smoking status:  Former Smoker    Packs/day: 1.50    Years: 51.00    Types: Cigarettes    Quit date: 05/24/2004  . Smokeless tobacco: Never Used  . Alcohol use No  . Drug use: No  . Sexual activity: Not Currently   Other Topics Concern  . Not on file   Social History Narrative  . No narrative on file     Current Outpatient Prescriptions:  .  acetaminophen (TYLENOL) 500 MG tablet, Take 500 mg by mouth every 6 (six) hours as needed. Pt takes 2 tablets as needed., Disp: , Rfl:  .  ALPRAZolam (XANAX) 0.5 MG tablet, Take 1 tablet (0.5 mg total) by mouth 3 (three) times daily as needed for anxiety., Disp: 90 tablet, Rfl: 0 .  busPIRone (BUSPAR) 10 MG tablet, Take 10 mg by mouth 2 (two) times daily., Disp: , Rfl:  .  citalopram (CELEXA) 20 MG tablet, TAKE 1 TABLET AT BEDTIME, Disp: 90 tablet, Rfl: 0 .  clopidogrel (PLAVIX) 75 MG tablet, TAKE 1 TABLET EVERY DAY, Disp: 90 tablet, Rfl: 0 .  docusate sodium (COLACE) 100 MG capsule, Take 100 mg by mouth daily. Pt taking  once a day with breakfast, Disp: , Rfl:  .  donepezil (ARICEPT) 5 MG tablet, Take 5 mg by mouth at bedtime., Disp: , Rfl:  .  ferrous sulfate 325 (65 FE) MG tablet, Take 1 tablet (325 mg total) by mouth 2 (two) times daily with a meal., Disp: 180 tablet, Rfl: 0 .  gabapentin (NEURONTIN) 300 MG capsule, TAKE 1 CAPSULE (300 MG TOTAL) BY MOUTH 3 (THREE) TIMES DAILY., Disp: 270 capsule, Rfl: 1 .  glucose blood test strip, Use as instructed., Disp: 100 each, Rfl: 12 .  HYDROcodone-acetaminophen (NORCO) 7.5-325 MG tablet, Take 1 tablet by mouth 3 (three) times daily as needed., Disp: 90 tablet, Rfl: 0 .  metoprolol (LOPRESSOR) 50 MG tablet, TAKE 1 TABLET TWICE DAILY, Disp: 180 tablet, Rfl: 0 .  Multiple Vitamins-Minerals (HAIR SKIN AND NAILS FORMULA) TABS, Take by mouth. One tablet daily, Disp: , Rfl:  .  pantoprazole (PROTONIX) 40 MG tablet, TAKE 1 TABLET (40 MG TOTAL) BY MOUTH DAILY., Disp: 90 tablet, Rfl: 1 .  QUEtiapine (SEROQUEL) 25 MG tablet, Take  25 mg by mouth at bedtime. Then increase to 2 qhs, Disp: , Rfl:  .  QUEtiapine (SEROQUEL) 50 MG tablet, Take 50 mg by mouth., Disp: , Rfl:  .  ranitidine (ZANTAC) 150 MG capsule, TAKE 1 CAPSULE AT BEDTIME, Disp: 90 capsule, Rfl: 1 .  simvastatin (ZOCOR) 40 MG tablet, TAKE 1 TABLET EVERY DAY, Disp: 90 tablet, Rfl: 0 .  VENTOLIN HFA 108 (90 BASE) MCG/ACT inhaler, Inhale 1-2 puffs into the lungs every 4 (four) hours as needed for wheezing or shortness of breath. , Disp: , Rfl:  .  Vitamin D, Ergocalciferol, (DRISDOL) 50000 UNITS CAPS capsule, , Disp: , Rfl:   Allergies  Allergen Reactions  . Fentanyl Other (See Comments)    urine retention  . Penicillins Itching and Rash     ROS   Objective  Vitals:   09/15/16 0959  BP: 115/73  Pulse: 60  Resp: 16  Temp: 97.9 F (36.6 C)  TempSrc: Oral  SpO2: 93%    Physical Exam  Constitutional: She is oriented to person, place, and time and well-developed, well-nourished, and in no distress.  Cardiovascular: Normal rate, regular rhythm and normal heart sounds.   No murmur heard. Pulmonary/Chest: Effort normal and breath sounds normal. She has no wheezes.  Musculoskeletal:  s/p left above-knee amputation, stump normal in appearance mild tenderness around the medial side.   Neurological: She is alert and oriented to person, place, and time.  Psychiatric: Mood, memory, affect and judgment normal.  Nursing note and vitals reviewed.      Assessment & Plan  1. 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 (HCC) DC gabapentin, started on Lyrica 50 mg 3 times a day, educated on adverse effects, follow-up in one month - pregabalin (LYRICA) 50 MG capsule; Take 1 capsule (50 mg total) by mouth 3 (three) times daily.  Dispense: 90 capsule; Refill: 0   Amazing Cowman Asad A. [facility] 09/15/2016 10:08 AM

## 2016-09-17 DIAGNOSIS — I129 Hypertensive chronic kidney disease with stage 1 through stage 4 chronic kidney disease, or unspecified chronic kidney disease: Secondary | ICD-10-CM | POA: Diagnosis not present

## 2016-09-17 DIAGNOSIS — D631 Anemia in chronic kidney disease: Secondary | ICD-10-CM | POA: Diagnosis not present

## 2016-09-17 DIAGNOSIS — R809 Proteinuria, unspecified: Secondary | ICD-10-CM | POA: Diagnosis not present

## 2016-09-17 DIAGNOSIS — R509 Fever, unspecified: Secondary | ICD-10-CM | POA: Diagnosis not present

## 2016-09-17 DIAGNOSIS — N2581 Secondary hyperparathyroidism of renal origin: Secondary | ICD-10-CM | POA: Diagnosis not present

## 2016-09-17 DIAGNOSIS — N183 Chronic kidney disease, stage 3 (moderate): Secondary | ICD-10-CM | POA: Diagnosis not present

## 2016-09-20 DIAGNOSIS — S31125A Laceration of abdominal wall with foreign body, periumbilic region without penetration into peritoneal cavity, initial encounter: Secondary | ICD-10-CM | POA: Diagnosis not present

## 2016-09-20 DIAGNOSIS — Z89612 Acquired absence of left leg above knee: Secondary | ICD-10-CM | POA: Diagnosis not present

## 2016-09-23 DIAGNOSIS — Z8659 Personal history of other mental and behavioral disorders: Secondary | ICD-10-CM | POA: Diagnosis not present

## 2016-09-23 DIAGNOSIS — F0151 Vascular dementia with behavioral disturbance: Secondary | ICD-10-CM | POA: Diagnosis not present

## 2016-09-23 DIAGNOSIS — F0281 Dementia in other diseases classified elsewhere with behavioral disturbance: Secondary | ICD-10-CM | POA: Diagnosis not present

## 2016-09-23 DIAGNOSIS — G546 Phantom limb syndrome with pain: Secondary | ICD-10-CM | POA: Diagnosis not present

## 2016-09-23 DIAGNOSIS — F411 Generalized anxiety disorder: Secondary | ICD-10-CM | POA: Diagnosis not present

## 2016-09-23 DIAGNOSIS — G309 Alzheimer's disease, unspecified: Secondary | ICD-10-CM | POA: Diagnosis not present

## 2016-10-15 ENCOUNTER — Ambulatory Visit (INDEPENDENT_AMBULATORY_CARE_PROVIDER_SITE_OTHER): Payer: Medicare HMO | Admitting: Family Medicine

## 2016-10-15 ENCOUNTER — Encounter: Payer: Self-pay | Admitting: Family Medicine

## 2016-10-15 VITALS — BP 118/71 | HR 71 | Temp 98.2°F | Resp 16

## 2016-10-15 DIAGNOSIS — E7800 Pure hypercholesterolemia, unspecified: Secondary | ICD-10-CM | POA: Diagnosis not present

## 2016-10-15 DIAGNOSIS — N183 Chronic kidney disease, stage 3 unspecified: Secondary | ICD-10-CM

## 2016-10-15 DIAGNOSIS — G546 Phantom limb syndrome with pain: Secondary | ICD-10-CM | POA: Diagnosis not present

## 2016-10-15 DIAGNOSIS — E1122 Type 2 diabetes mellitus with diabetic chronic kidney disease: Secondary | ICD-10-CM

## 2016-10-15 LAB — GLUCOSE, POCT (MANUAL RESULT ENTRY): POC Glucose: 81 mg/dl (ref 70–99)

## 2016-10-15 LAB — POCT GLYCOSYLATED HEMOGLOBIN (HGB A1C): HEMOGLOBIN A1C: 5.6

## 2016-10-15 MED ORDER — PREGABALIN 50 MG PO CAPS: 50.0000 mg | ORAL_CAPSULE | Freq: Three times a day (TID) | ORAL | 0 refills | Status: DC

## 2016-10-15 MED ORDER — HYDROCODONE-ACETAMINOPHEN 7.5-325 MG PO TABS: 1.0000 | ORAL_TABLET | Freq: Three times a day (TID) | ORAL | 0 refills | Status: DC | PRN

## 2016-10-15 NOTE — Telephone Encounter (Signed)
ERRENOUS °

## 2016-10-15 NOTE — Progress Notes (Signed)
Name: Amy [name]   MRN: [id number]    DOB: [birth year]   Date:10/15/2016       Progress Note  Subjective  Chief Complaint  Chief Complaint  Patient presents with  . Follow-up    1 mo  . Medication Refill    Hyperlipidemia  This is a chronic problem. The problem is controlled. Recent lipid tests were reviewed and are normal. Exacerbating diseases include obesity. Associated symptoms include myalgias. Pertinent negatives include no leg pain. Current antihyperlipidemic treatment includes statins. Risk factors for coronary artery disease include dyslipidemia, obesity and a sedentary lifestyle.    Phantom Limb Syndrome: Pt. Presents for follow up of Phantom Limb pain, has history of left AKA 10 years ago, now taking Lyrica in addition to Hydrocodone, seems to be doing well but there are days when her pain is not completely relieved and she asks for a second pill of Lyrica after having taken the first one a few hours ago. She is wheelchair bound and otherwise doing well.   Past Medical History:  Diagnosis Date  . Anxiety   . Bilateral cataracts   . Bronchitis    hx of   . Cancer of kidney, left ([place]) 2017  . Cataract   . Chronic kidney disease   . Depression   . Diabetes mellitus without complication ([place])   . Dry cough   . 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 GERD (gastroesophageal reflux disease)   . History of frequent urinary tract infections   . Hyperlipidemia   . Hypertension   . Left renal mass   . Numbness and tingling    right hand   . Obesity   . Peripheral artery disease ([place])   . Urinary frequency   . Urinary incontinence     Past Surgical History:  Procedure Laterality Date  . ABDOMINAL HYSTERECTOMY  1978  . CARPAL TUNNEL RELEASE Bilateral   . CHOLECYSTECTOMY    . LEG AMPUTATION     above the knee / left   . ROBOT ASSISTED LAPAROSCOPIC NEPHRECTOMY Left 01/15/2015   Procedure: ROBOTIC ASSISTED  LAPAROSCOPIC RADICAL NEPHRECTOMY;  Surgeon: Alexis Frock, MD;  Location: [facility];  Service: Urology;  Laterality: Left;  . stent placement right leg       Family History  Problem Relation Age of Onset  . Cancer Mother        Stomach  . Cancer Father   . Diabetes Brother   . Cancer Brother     Social History   Social History  . Marital status: Married    Spouse name: N/A  . Number of children: N/A  . Years of education: N/A   Occupational History  . Not on file.   Social History Main Topics  . Smoking status: Former Smoker    Packs/day: 1.50    Years: 51.00    Types: Cigarettes    Quit date: 05/24/2004  . Smokeless tobacco: Never Used  . Alcohol use No  . Drug use: No  . Sexual activity: Not Currently   Other Topics Concern  . Not on file   Social History Narrative  . No narrative on file     Current Outpatient Prescriptions:  .  acetaminophen (TYLENOL) 500 MG tablet, Take 500 mg by mouth every 6 (six) hours as needed. Pt takes 2 tablets as needed., Disp: , Rfl:  .  ALPRAZolam (XANAX) 0.5 MG tablet, Take  1 tablet (0.5 mg total) by mouth 3 (three) times daily as needed for anxiety., Disp: 90 tablet, Rfl: 0 .  busPIRone (BUSPAR) 10 MG tablet, Take 10 mg by mouth 2 (two) times daily., Disp: , Rfl:  .  citalopram (CELEXA) 20 MG tablet, TAKE 1 TABLET AT BEDTIME, Disp: 90 tablet, Rfl: 0 .  clopidogrel (PLAVIX) 75 MG tablet, TAKE 1 TABLET EVERY DAY, Disp: 90 tablet, Rfl: 0 .  docusate sodium (COLACE) 100 MG capsule, Take 100 mg by mouth daily. Pt taking once a day with breakfast, Disp: , Rfl:  .  ferrous sulfate 325 (65 FE) MG tablet, Take 1 tablet (325 mg total) by mouth 2 (two) times daily with a meal., Disp: 180 tablet, Rfl: 0 .  gabapentin (NEURONTIN) 300 MG capsule, TAKE 1 CAPSULE (300 MG TOTAL) BY MOUTH 3 (THREE) TIMES DAILY., Disp: 270 capsule, Rfl: 1 .  glucose blood test strip, Use as instructed., Disp: 100 each, Rfl: 12 .  HYDROcodone-acetaminophen (NORCO)  7.5-325 MG tablet, Take 1 tablet by mouth 3 (three) times daily as needed., Disp: 90 tablet, Rfl: 0 .  metoprolol (LOPRESSOR) 50 MG tablet, TAKE 1 TABLET TWICE DAILY, Disp: 180 tablet, Rfl: 0 .  Multiple Vitamins-Minerals (HAIR SKIN AND NAILS FORMULA) TABS, Take by mouth. One tablet daily, Disp: , Rfl:  .  pantoprazole (PROTONIX) 40 MG tablet, TAKE 1 TABLET (40 MG TOTAL) BY MOUTH DAILY., Disp: 90 tablet, Rfl: 1 .  pregabalin (LYRICA) 50 MG capsule, Take 1 capsule (50 mg total) by mouth 3 (three) times daily., Disp: 90 capsule, Rfl: 0 .  QUEtiapine (SEROQUEL) 25 MG tablet, Take 25 mg by mouth at bedtime. Then increase to 2 qhs, Disp: , Rfl:  .  QUEtiapine (SEROQUEL) 50 MG tablet, Take 50 mg by mouth., Disp: , Rfl:  .  ranitidine (ZANTAC) 150 MG capsule, TAKE 1 CAPSULE AT BEDTIME, Disp: 90 capsule, Rfl: 1 .  simvastatin (ZOCOR) 40 MG tablet, TAKE 1 TABLET EVERY DAY, Disp: 90 tablet, Rfl: 0 .  VENTOLIN HFA 108 (90 BASE) MCG/ACT inhaler, Inhale 1-2 puffs into the lungs every 4 (four) hours as needed for wheezing or shortness of breath. , Disp: , Rfl:  .  Vitamin D, Ergocalciferol, (DRISDOL) 50000 UNITS CAPS capsule, , Disp: , Rfl:  .  donepezil (ARICEPT) 5 MG tablet, Take 5 mg by mouth at bedtime., Disp: , Rfl:   Allergies  Allergen Reactions  . Fentanyl Other (See Comments)    urine retention  . Penicillins Itching and Rash     Review of Systems  Constitutional: Negative for chills, fever and malaise/fatigue.  Musculoskeletal: Positive for myalgias.  Psychiatric/Behavioral: Positive for depression. The patient is nervous/anxious.      Objective  Vitals:   10/15/16 1031  BP: 118/71  Pulse: 71  Resp: 16  Temp: 98.2 F (36.8 C)  TempSrc: Oral  SpO2: 95%    Physical Exam  Constitutional: She is oriented to person, place, and time and well-developed, well-nourished, and in no distress.  HENT:  Head: Normocephalic and atraumatic.  Cardiovascular: Normal rate, regular rhythm and  normal heart sounds.   No murmur heard. Pulmonary/Chest: Effort normal and breath sounds normal. She has no wheezes.  Musculoskeletal:  Left AKA, no tenderness to palpation  Neurological: She is alert and oriented to person, place, and time.  Psychiatric: Mood, memory, affect and judgment normal.  Nursing note and vitals reviewed.     Assessment & Plan  1. Controlled type 2 diabetes mellitus with  stage 3 chronic kidney disease, without long-term current use of insulin (HCC)   A1c is 5.6%, considered well-controlled diabetes, no indication for pharmacotherapy - POCT HgB A1C - POCT Glucose (CBG)  2. 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 Patient compliant with controlled substances agreement. Refills provided - HYDROcodone-acetaminophen (NORCO) 7.5-325 MG tablet; Take 1 tablet by mouth 3 (three) times daily as needed.  Dispense: 90 tablet; Refill: 0 - pregabalin (LYRICA) 50 MG capsule; Take 1 capsule (50 mg total) by mouth 3 (three) times daily. May take up to 4 pills a day as needed.  Dispense: 120 capsule; Refill: 0  3. Pure hypercholesterolemia  - Lipid panel   Julez Huseby [facility] 10/15/2016 10:42 AM

## 2016-10-16 LAB — LIPID PANEL
CHOLESTEROL: 133 mg/dL (ref <200)
HDL: 39 mg/dL — ABNORMAL LOW (ref >50)
LDL Cholesterol: 57 mg/dL (ref <100)
Total CHOL/HDL Ratio: 3.4 Ratio (ref <5.0)
Triglycerides: 185 mg/dL — ABNORMAL HIGH (ref <150)
VLDL: 37 mg/dL — ABNORMAL HIGH (ref <30)

## 2016-10-21 DIAGNOSIS — S31125A Laceration of abdominal wall with foreign body, periumbilic region without penetration into peritoneal cavity, initial encounter: Secondary | ICD-10-CM | POA: Diagnosis not present

## 2016-10-21 DIAGNOSIS — Z89612 Acquired absence of left leg above knee: Secondary | ICD-10-CM | POA: Diagnosis not present

## 2016-11-15 ENCOUNTER — Ambulatory Visit (INDEPENDENT_AMBULATORY_CARE_PROVIDER_SITE_OTHER): Payer: Medicare HMO | Admitting: Family Medicine

## 2016-11-15 ENCOUNTER — Encounter: Payer: Self-pay | Admitting: Family Medicine

## 2016-11-15 VITALS — BP 122/78 | HR 55 | Temp 97.7°F | Resp 16

## 2016-11-15 DIAGNOSIS — F3342 Major depressive disorder, recurrent, in full remission: Secondary | ICD-10-CM

## 2016-11-15 DIAGNOSIS — E7800 Pure hypercholesterolemia, unspecified: Secondary | ICD-10-CM

## 2016-11-15 DIAGNOSIS — K219 Gastro-esophageal reflux disease without esophagitis: Secondary | ICD-10-CM

## 2016-11-15 DIAGNOSIS — G546 Phantom limb syndrome with pain: Secondary | ICD-10-CM

## 2016-11-15 DIAGNOSIS — I739 Peripheral vascular disease, unspecified: Secondary | ICD-10-CM

## 2016-11-15 DIAGNOSIS — F32A Depression, unspecified: Secondary | ICD-10-CM | POA: Insufficient documentation

## 2016-11-15 DIAGNOSIS — F329 Major depressive disorder, single episode, unspecified: Secondary | ICD-10-CM | POA: Insufficient documentation

## 2016-11-15 MED ORDER — CLOPIDOGREL BISULFATE 75 MG PO TABS: 75.0000 mg | ORAL_TABLET | Freq: Every day | ORAL | 0 refills | Status: DC

## 2016-11-15 MED ORDER — PANTOPRAZOLE SODIUM 40 MG PO TBEC: 40.0000 mg | DELAYED_RELEASE_TABLET | Freq: Every day | ORAL | 1 refills | Status: DC

## 2016-11-15 MED ORDER — PREGABALIN 50 MG PO CAPS: 50.0000 mg | ORAL_CAPSULE | Freq: Three times a day (TID) | ORAL | 0 refills | Status: DC

## 2016-11-15 MED ORDER — HYDROCODONE-ACETAMINOPHEN 7.5-325 MG PO TABS: 1.0000 | ORAL_TABLET | Freq: Three times a day (TID) | ORAL | 0 refills | Status: DC | PRN

## 2016-11-15 MED ORDER — SIMVASTATIN 40 MG PO TABS: 40.0000 mg | ORAL_TABLET | Freq: Every day | ORAL | 0 refills | Status: DC

## 2016-11-15 MED ORDER — CITALOPRAM HYDROBROMIDE 20 MG PO TABS: 20.0000 mg | ORAL_TABLET | Freq: Every day | ORAL | 0 refills | Status: DC

## 2016-11-15 NOTE — Progress Notes (Signed)
Name: Amy [name]   MRN: [id number]    DOB: Jul 05, 1935   Date:11/15/2016       Progress Note  Subjective  Chief Complaint  Chief Complaint  Patient presents with  . Follow-up  . Medication Refill    [facility] and lyrica.  The others go to mail order: simvastatin,clopidogrel,citslopram and pantoprazol    Hyperlipidemia  This is a chronic problem. The problem is uncontrolled. Recent lipid tests were reviewed and are high. Exacerbating diseases include obesity. Pertinent negatives include no leg pain, myalgias or shortness of breath. Current antihyperlipidemic treatment includes statins.  Gastroesophageal Reflux  She reports no abdominal pain, no belching, no choking, no heartburn, no hoarse voice, no sore throat or no water brash. This is a chronic problem. She has tried a PPI for the symptoms.  Depression         This is a chronic problem.  The onset quality is gradual. The problem is unchanged.  Associated symptoms include no helplessness, no hopelessness, no decreased interest, no body aches, no myalgias, not sad and no suicidal ideas.  Past treatments include SSRIs - Selective serotonin reuptake inhibitors.  Compliance with treatment is good.   Phantom Limb Pain: She is doing very well on Hydrocodone-Acetaminophen and Lyrica for phantom limb pain. Takes both tablets three times daily as needed, no reported adverse effects.     Past Medical History:  Diagnosis Date  . Anxiety   . Bilateral cataracts   . Bronchitis    hx of   . Cancer of kidney, left ([place]) 2017  . Cataract   . Chronic kidney disease   . Depression   . Diabetes mellitus without complication ([place])   . Dry cough   . 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 GERD (gastroesophageal reflux disease)   . History of frequent urinary tract infections   . Hyperlipidemia   . Hypertension   . Left renal mass   . Numbness and tingling    right hand    . Obesity   . Peripheral artery disease ([place])   . Urinary frequency   . Urinary incontinence     Past Surgical History:  Procedure Laterality Date  . ABDOMINAL HYSTERECTOMY  1978  . CARPAL TUNNEL RELEASE Bilateral   . CHOLECYSTECTOMY    . LEG AMPUTATION     above the knee / left   . ROBOT ASSISTED LAPAROSCOPIC NEPHRECTOMY Left 01/15/2015   Procedure: ROBOTIC ASSISTED LAPAROSCOPIC RADICAL NEPHRECTOMY;  Surgeon: Alexis Frock, MD;  Location: [facility];  Service: Urology;  Laterality: Left;  . stent placement right leg       Family History  Problem Relation Age of Onset  . Cancer Mother        Stomach  . Cancer Father   . Diabetes Brother   . Cancer Brother     Social History   Social History  . Marital status: Married    Spouse name: N/A  . Number of children: N/A  . Years of education: N/A   Occupational History  . Not on file.   Social History Main Topics  . Smoking status: Former Smoker    Packs/day: 1.50    Years: 51.00    Types: Cigarettes    Quit date: 05/24/2004  . Smokeless tobacco: Never Used  . Alcohol use No  . Drug use: No  . Sexual activity: Not Currently  Other Topics Concern  . Not on file   Social History Narrative  . No narrative on file     Current Outpatient Prescriptions:  .  acetaminophen (TYLENOL) 500 MG tablet, Take 500 mg by mouth every 6 (six) hours as needed. Pt takes 2 tablets as needed., Disp: , Rfl:  .  busPIRone (BUSPAR) 10 MG tablet, Take 10 mg by mouth 2 (two) times daily., Disp: , Rfl:  .  citalopram (CELEXA) 20 MG tablet, TAKE 1 TABLET AT BEDTIME, Disp: 90 tablet, Rfl: 0 .  clopidogrel (PLAVIX) 75 MG tablet, TAKE 1 TABLET EVERY DAY, Disp: 90 tablet, Rfl: 0 .  docusate sodium (COLACE) 100 MG capsule, Take 100 mg by mouth daily. Pt taking once a day with breakfast, Disp: , Rfl:  .  donepezil (ARICEPT) 5 MG tablet, Take 5 mg by mouth at bedtime., Disp: , Rfl:  .  ferrous sulfate 325 (65 FE) MG tablet, Take 1 tablet (325 mg  total) by mouth 2 (two) times daily with a meal., Disp: 180 tablet, Rfl: 0 .  glucose blood test strip, Use as instructed., Disp: 100 each, Rfl: 12 .  HYDROcodone-acetaminophen (NORCO) 7.5-325 MG tablet, Take 1 tablet by mouth 3 (three) times daily as needed., Disp: 90 tablet, Rfl: 0 .  metoprolol (LOPRESSOR) 50 MG tablet, TAKE 1 TABLET TWICE DAILY, Disp: 180 tablet, Rfl: 0 .  Multiple Vitamins-Minerals (HAIR SKIN AND NAILS FORMULA) TABS, Take by mouth. One tablet daily, Disp: , Rfl:  .  pantoprazole (PROTONIX) 40 MG tablet, TAKE 1 TABLET (40 MG TOTAL) BY MOUTH DAILY., Disp: 90 tablet, Rfl: 1 .  pregabalin (LYRICA) 50 MG capsule, Take 1 capsule (50 mg total) by mouth 3 (three) times daily. May take up to 4 pills a day as needed., Disp: 120 capsule, Rfl: 0 .  QUEtiapine (SEROQUEL) 50 MG tablet, Take 50 mg by mouth., Disp: , Rfl:  .  simvastatin (ZOCOR) 40 MG tablet, TAKE 1 TABLET EVERY DAY, Disp: 90 tablet, Rfl: 0  Allergies  Allergen Reactions  . Fentanyl Other (See Comments)    urine retention  . Penicillins Itching and Rash     Review of Systems  HENT: Negative for hoarse voice and sore throat.   Respiratory: Negative for choking and shortness of breath.   Gastrointestinal: Negative for abdominal pain and heartburn.  Musculoskeletal: Negative for myalgias.  Psychiatric/Behavioral: Positive for depression. Negative for suicidal ideas.      Objective  Vitals:   11/15/16 1057  BP: 122/78  Pulse: (!) 55  Resp: 16  Temp: 97.7 F (36.5 C)  TempSrc: Oral  SpO2: 93%    Physical Exam  Constitutional: She is oriented to person, place, and time and well-developed, well-nourished, and in no distress.  HENT:  Head: Normocephalic and atraumatic.  Cardiovascular: Normal rate, regular rhythm and normal heart sounds.   No murmur heard. Pulmonary/Chest: Effort normal and breath sounds normal. She has no wheezes.  Musculoskeletal: She exhibits no edema.  S/p above left knee  amputation.   Neurological: She is alert and oriented to person, place, and time.  Psychiatric: Mood, memory, affect and judgment normal.  Nursing note and vitals reviewed.    Assessment & Plan  1. 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 (HCC) Stable and responsive to hydrocodone and Lyrica taken up to 3 times daily as prescribed, patient compliant with controlled substances agreement. Refills provided - pregabalin (LYRICA) 50 MG capsule; Take 1 capsule (50 mg total) by mouth 3 (three) times  daily. May take up to 4 pills a day as needed.  Dispense: 120 capsule; Refill: 0 - HYDROcodone-acetaminophen (NORCO) 7.5-325 MG tablet; Take 1 tablet by mouth 3 (three) times daily as needed.  Dispense: 90 tablet; Refill: 0  2. PVD (peripheral vascular disease) (HCC)  - clopidogrel (PLAVIX) 75 MG tablet; Take 1 tablet (75 mg total) by mouth daily.  Dispense: 90 tablet; Refill: 0  3. Gastroesophageal reflux disease, esophagitis presence not specified  - pantoprazole (PROTONIX) 40 MG tablet; Take 1 tablet (40 mg total) by mouth daily.  Dispense: 90 tablet; Refill: 1  4. Pure hypercholesterolemia  - simvastatin (ZOCOR) 40 MG tablet; Take 1 tablet (40 mg total) by mouth daily.  Dispense: 90 tablet; Refill: 0  5. Recurrent major depressive disorder, in full remission ([place])  - citalopram (CELEXA) 20 MG tablet; Take 1 tablet (20 mg total) by mouth at bedtime.  Dispense: 90 tablet; Refill: 0   Hannibal Skalla [facility] 11/15/2016 11:14 AM

## 2016-11-20 DIAGNOSIS — S31125A Laceration of abdominal wall with foreign body, periumbilic region without penetration into peritoneal cavity, initial encounter: Secondary | ICD-10-CM | POA: Diagnosis not present

## 2016-11-20 DIAGNOSIS — Z89612 Acquired absence of left leg above knee: Secondary | ICD-10-CM | POA: Diagnosis not present

## 2016-12-07 ENCOUNTER — Inpatient Hospital Stay: Payer: Medicare HMO | Attending: Hematology and Oncology

## 2016-12-07 DIAGNOSIS — Z800 Family history of malignant neoplasm of digestive organs: Secondary | ICD-10-CM | POA: Diagnosis not present

## 2016-12-07 DIAGNOSIS — Z880 Allergy status to penicillin: Secondary | ICD-10-CM | POA: Insufficient documentation

## 2016-12-07 DIAGNOSIS — Z85528 Personal history of other malignant neoplasm of kidney: Secondary | ICD-10-CM | POA: Insufficient documentation

## 2016-12-07 DIAGNOSIS — Z79899 Other long term (current) drug therapy: Secondary | ICD-10-CM | POA: Diagnosis not present

## 2016-12-07 DIAGNOSIS — I129 Hypertensive chronic kidney disease with stage 1 through stage 4 chronic kidney disease, or unspecified chronic kidney disease: Secondary | ICD-10-CM | POA: Diagnosis not present

## 2016-12-07 DIAGNOSIS — Z809 Family history of malignant neoplasm, unspecified: Secondary | ICD-10-CM | POA: Insufficient documentation

## 2016-12-07 DIAGNOSIS — Z87891 Personal history of nicotine dependence: Secondary | ICD-10-CM | POA: Diagnosis not present

## 2016-12-07 DIAGNOSIS — E785 Hyperlipidemia, unspecified: Secondary | ICD-10-CM | POA: Diagnosis not present

## 2016-12-07 DIAGNOSIS — R5383 Other fatigue: Secondary | ICD-10-CM | POA: Diagnosis not present

## 2016-12-07 DIAGNOSIS — Z89612 Acquired absence of left leg above knee: Secondary | ICD-10-CM | POA: Diagnosis not present

## 2016-12-07 DIAGNOSIS — Z86718 Personal history of other venous thrombosis and embolism: Secondary | ICD-10-CM | POA: Insufficient documentation

## 2016-12-07 DIAGNOSIS — N189 Chronic kidney disease, unspecified: Secondary | ICD-10-CM | POA: Insufficient documentation

## 2016-12-07 DIAGNOSIS — D509 Iron deficiency anemia, unspecified: Secondary | ICD-10-CM | POA: Diagnosis not present

## 2016-12-07 DIAGNOSIS — G546 Phantom limb syndrome with pain: Secondary | ICD-10-CM | POA: Insufficient documentation

## 2016-12-07 DIAGNOSIS — E1122 Type 2 diabetes mellitus with diabetic chronic kidney disease: Secondary | ICD-10-CM | POA: Insufficient documentation

## 2016-12-07 DIAGNOSIS — R0989 Other specified symptoms and signs involving the circulatory and respiratory systems: Secondary | ICD-10-CM | POA: Insufficient documentation

## 2016-12-07 DIAGNOSIS — Z905 Acquired absence of kidney: Secondary | ICD-10-CM | POA: Insufficient documentation

## 2016-12-07 DIAGNOSIS — K219 Gastro-esophageal reflux disease without esophagitis: Secondary | ICD-10-CM | POA: Diagnosis not present

## 2016-12-07 DIAGNOSIS — D49519 Neoplasm of unspecified behavior of unspecified kidney: Secondary | ICD-10-CM

## 2016-12-07 LAB — CBC WITH DIFFERENTIAL/PLATELET
Basophils Absolute: 0.1 10*3/uL (ref 0–0.1)
Basophils Relative: 1 %
Eosinophils Absolute: 0.2 10*3/uL (ref 0–0.7)
Eosinophils Relative: 3 %
HCT: 35.7 % (ref 35.0–47.0)
Hemoglobin: 11.9 g/dL — ABNORMAL LOW (ref 12.0–16.0)
Lymphocytes Relative: 29 %
Lymphs Abs: 2 10*3/uL (ref 1.0–3.6)
MCH: 30.9 pg (ref 26.0–34.0)
MCHC: 33.2 g/dL (ref 32.0–36.0)
MCV: 92.8 fL (ref 80.0–100.0)
Monocytes Absolute: 0.7 10*3/uL (ref 0.2–0.9)
Monocytes Relative: 10 %
Neutro Abs: 3.9 10*3/uL (ref 1.4–6.5)
Neutrophils Relative %: 57 %
Platelets: 150 10*3/uL (ref 150–440)
RBC: 3.85 MIL/uL (ref 3.80–5.20)
RDW: 14.9 % — ABNORMAL HIGH (ref 11.5–14.5)
WBC: 6.8 10*3/uL (ref 3.6–11.0)

## 2016-12-07 LAB — IRON AND TIBC
Iron: 59 ug/dL (ref 28–170)
Saturation Ratios: 24 % (ref 10.4–31.8)
TIBC: 251 ug/dL (ref 250–450)
UIBC: 192 ug/dL

## 2016-12-07 LAB — FERRITIN: Ferritin: 69 ng/mL (ref 11–307)

## 2016-12-08 ENCOUNTER — Encounter: Payer: Self-pay | Admitting: Hematology and Oncology

## 2016-12-08 ENCOUNTER — Inpatient Hospital Stay (HOSPITAL_BASED_OUTPATIENT_CLINIC_OR_DEPARTMENT_OTHER): Payer: Medicare HMO | Admitting: Hematology and Oncology

## 2016-12-08 ENCOUNTER — Telehealth: Payer: Self-pay | Admitting: *Deleted

## 2016-12-08 ENCOUNTER — Other Ambulatory Visit: Payer: Self-pay | Admitting: *Deleted

## 2016-12-08 ENCOUNTER — Ambulatory Visit
Admission: RE | Admit: 2016-12-08 | Discharge: 2016-12-08 | Disposition: A | Discharge status: Home / Self Care | Payer: Medicare HMO | Source: Ambulatory Visit | Attending: Hematology and Oncology | Admitting: Hematology and Oncology

## 2016-12-08 VITALS — BP 96/58 | HR 60 | Temp 97.0°F

## 2016-12-08 DIAGNOSIS — Z905 Acquired absence of kidney: Secondary | ICD-10-CM | POA: Diagnosis not present

## 2016-12-08 DIAGNOSIS — Z89612 Acquired absence of left leg above knee: Secondary | ICD-10-CM

## 2016-12-08 DIAGNOSIS — D509 Iron deficiency anemia, unspecified: Secondary | ICD-10-CM

## 2016-12-08 DIAGNOSIS — Z85528 Personal history of other malignant neoplasm of kidney: Secondary | ICD-10-CM

## 2016-12-08 DIAGNOSIS — N189 Chronic kidney disease, unspecified: Secondary | ICD-10-CM | POA: Diagnosis not present

## 2016-12-08 DIAGNOSIS — Z86718 Personal history of other venous thrombosis and embolism: Secondary | ICD-10-CM

## 2016-12-08 DIAGNOSIS — K219 Gastro-esophageal reflux disease without esophagitis: Secondary | ICD-10-CM

## 2016-12-08 DIAGNOSIS — Z880 Allergy status to penicillin: Secondary | ICD-10-CM

## 2016-12-08 DIAGNOSIS — G546 Phantom limb syndrome with pain: Secondary | ICD-10-CM

## 2016-12-08 DIAGNOSIS — E1122 Type 2 diabetes mellitus with diabetic chronic kidney disease: Secondary | ICD-10-CM

## 2016-12-08 DIAGNOSIS — Z800 Family history of malignant neoplasm of digestive organs: Secondary | ICD-10-CM

## 2016-12-08 DIAGNOSIS — R5383 Other fatigue: Secondary | ICD-10-CM

## 2016-12-08 DIAGNOSIS — E785 Hyperlipidemia, unspecified: Secondary | ICD-10-CM | POA: Diagnosis not present

## 2016-12-08 DIAGNOSIS — I129 Hypertensive chronic kidney disease with stage 1 through stage 4 chronic kidney disease, or unspecified chronic kidney disease: Secondary | ICD-10-CM | POA: Diagnosis not present

## 2016-12-08 DIAGNOSIS — R059 Cough, unspecified: Secondary | ICD-10-CM

## 2016-12-08 DIAGNOSIS — D49519 Neoplasm of unspecified behavior of unspecified kidney: Secondary | ICD-10-CM

## 2016-12-08 DIAGNOSIS — Z87891 Personal history of nicotine dependence: Secondary | ICD-10-CM

## 2016-12-08 DIAGNOSIS — R05 Cough: Secondary | ICD-10-CM | POA: Diagnosis not present

## 2016-12-08 DIAGNOSIS — Z79899 Other long term (current) drug therapy: Secondary | ICD-10-CM

## 2016-12-08 DIAGNOSIS — R0989 Other specified symptoms and signs involving the circulatory and respiratory systems: Secondary | ICD-10-CM | POA: Diagnosis not present

## 2016-12-08 DIAGNOSIS — D508 Other iron deficiency anemias: Secondary | ICD-10-CM

## 2016-12-08 DIAGNOSIS — Z809 Family history of malignant neoplasm, unspecified: Secondary | ICD-10-CM

## 2016-12-08 LAB — KAPPA/LAMBDA LIGHT CHAINS
Kappa free light chain: 43.9 mg/L — ABNORMAL HIGH (ref 3.3–19.4)
Kappa, lambda light chain ratio: 0.95 (ref 0.26–1.65)
Lambda free light chains: 46.1 mg/L — ABNORMAL HIGH (ref 5.7–26.3)

## 2016-12-08 NOTE — Progress Notes (Signed)
[facility] day:  12/08/2016  Chief Complaint: Amy [name] is a 81 y.o. female with iron deficiency anemia and a history of stage III renal cell carcinoma who is seen for 3 month assessment.  HPI:   The patient was last seen in the medical oncology clinic on 09/08/2016.  At that time, she noted chronic phantom leg pain.  Stool was dark on oral iron.  Ferritin was 22.  She continued oral iron.  CT scans revealed no evidence of metastatic disease.  Labs from 12/02/2016 revealed a hematocrit 35.7, hemoglobin 11.9, and MCV 92.8.  Ferritin was 69. Iron studies revealed a saturation of 24% a TIBC of 251.  Symptomatically, she feels tired.  She denies any dizziness or lightheadedness.  She denies any chest pain.  She feels congested.  She has some rattling with breathing.  She sees Dr Manuella Ghazi.  She is not using her nebulizer.   Past Medical History:  Diagnosis Date  . Anxiety   . Bilateral cataracts   . Bronchitis    hx of   . Cancer of kidney, left ([place]) 2017  . Cataract   . Chronic kidney disease   . Depression   . Diabetes mellitus without complication ([place])   . Dry cough   . 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 GERD (gastroesophageal reflux disease)   . History of frequent urinary tract infections   . Hyperlipidemia   . Hypertension   . Left renal mass   . Numbness and tingling    right hand   . Obesity   . Peripheral artery disease ([place])   . Urinary frequency   . Urinary incontinence     Past Surgical History:  Procedure Laterality Date  . ABDOMINAL HYSTERECTOMY  1978  . CARPAL TUNNEL RELEASE Bilateral   . CHOLECYSTECTOMY    . LEG AMPUTATION     above the knee / left   . ROBOT ASSISTED LAPAROSCOPIC NEPHRECTOMY Left 01/15/2015   Procedure: ROBOTIC ASSISTED LAPAROSCOPIC RADICAL NEPHRECTOMY;  Surgeon: Alexis Frock, MD;  Location: [facility];  Service: Urology;  Laterality: Left;  .  stent placement right leg       Family History  Problem Relation Age of Onset  . Cancer Mother        Stomach  . Cancer Father   . Diabetes Brother   . Cancer Brother     Social History:  reports that she quit smoking about 12 years ago. Her smoking use included Cigarettes. She has a 76.50 pack-year smoking history. She has never used smokeless tobacco. She reports that she does not drink alcohol or use drugs.  She is an Therapist, sports.  She lives in [place].  The patient is accompanied by her husband today.  Allergies:  Allergies  Allergen Reactions  . Fentanyl Other (See Comments)    urine retention  . Penicillins Itching and Rash    Current Medications: Current Outpatient Prescriptions  Medication Sig Dispense Refill  . acetaminophen (TYLENOL) 500 MG tablet Take 500 mg by mouth every 6 (six) hours as needed. Pt takes 2 tablets as needed.    . busPIRone (BUSPAR) 10 MG tablet Take 10 mg by mouth 2 (two) times daily.    . citalopram (CELEXA) 20 MG tablet Take 1 tablet (20 mg total) by mouth at bedtime. 90 tablet 0  . clopidogrel (PLAVIX) 75 MG tablet Take 1  tablet (75 mg total) by mouth daily. 90 tablet 0  . docusate sodium (COLACE) 100 MG capsule Take 100 mg by mouth daily. Pt taking once a day with breakfast    . ferrous sulfate 325 (65 FE) MG tablet Take 1 tablet (325 mg total) by mouth 2 (two) times daily with a meal. 180 tablet 0  . glucose blood test strip Use as instructed. 100 each 12  . HYDROcodone-acetaminophen (NORCO) 7.5-325 MG tablet Take 1 tablet by mouth 3 (three) times daily as needed. 90 tablet 0  . metoprolol (LOPRESSOR) 50 MG tablet TAKE 1 TABLET TWICE DAILY 180 tablet 0  . Multiple Vitamins-Minerals (HAIR SKIN AND NAILS FORMULA) TABS Take by mouth. One tablet daily    . pantoprazole (PROTONIX) 40 MG tablet Take 1 tablet (40 mg total) by mouth daily. 90 tablet 1  . pregabalin (LYRICA) 50 MG capsule Take 1 capsule (50 mg total) by mouth 3 (three) times daily. May take up to 4  pills a day as needed. 120 capsule 0  . QUEtiapine (SEROQUEL) 50 MG tablet Take 50 mg by mouth.    . simvastatin (ZOCOR) 40 MG tablet Take 1 tablet (40 mg total) by mouth daily. 90 tablet 0  . donepezil (ARICEPT) 5 MG tablet Take 5 mg by mouth at bedtime.     No current facility-administered medications for this visit.     Review of Systems:  GENERAL:  Feels "tired".  No fevers or sweats.  No new weight. PERFORMANCE STATUS (ECOG):  2-3 HEENT:  No visual changes, runny nose, sore throat, mouth sores or tenderness. Lungs: Congestion.  Rattling on breathing.  No shortness of breath or cough.  No hemoptysis. Cardiac:  No chest pain, palpitations, orthopnea, or PND. GI:  Black stool on iron.  No nausea, vomiting, diarrhea, constipation, or hematochezia. GU:  No urgency, frequency, dysuria, or hematuria. Musculoskeletal:  Left arm hurts on awakening.  Right 3rd finger hurts (no change).  No back pain.  No joint pain.  No muscle tenderness. Extremities:  No pain or swelling. Skin:  No rashes or skin changes. Neuro:  No headache, numbness or weakness, balance or coordination issues. Endocrine:  No diabetes, thyroid issues, hot flashes or night sweats. Psych:  No mood changes, depression or anxiety. Pain:  No focal pain. Review of systems:  All other systems reviewed and found to be negative.  Physical Exam: Blood pressure (!) 86/57, pulse 60, temperature (!) 97 F (36.1 C), temperature source Tympanic, SpO2 92 %. GENERAL:  Well developed, well nourished, woman sitting comfortably in a wheelchair in the exam room in no acute distress. MENTAL STATUS:  Alert and oriented to person, place and time. HEAD:  Graying hair.  Head tilt to the left.  Normocephalic, atraumatic, face symmetric, no Cushingoid features. EYES:  Glasses.  Brown eyes.  Pupils equal round and reactive to light and accomodation.  No conjunctivitis or scleral icterus. ENT:  Oropharynx clear without lesion.  Tongue normal. Mucous  membranes moist.  RESPIRATORY:  Audible breathing across room.  Coarse breath sounds (right > left).  No wheezes or rhonchi. CARDIOVASCULAR:  Regular rate and rhythm without murmur, rub or gallop. ABDOMEN:  Soft, non-tender, with active bowel sounds, and no hepatosplenomegaly.  No masses. SKIN:  No rashes, ulcers or lesions. EXTREMITIES: Left sided above the knee amputation.  No edema, no skin discoloration or tenderness.  No palpable cords. LYMPH NODES: No palpable cervical, supraclavicular, axillary or inguinal adenopathy  NEUROLOGICAL: Unremarkable. PSYCH:  Appropriate.  Appointment on 12/07/2016  Component Date Value Ref Range Status  . WBC 12/07/2016 6.8  3.6 - 11.0 K/uL Final  . RBC 12/07/2016 3.85  3.80 - 5.20 MIL/uL Final  . Hemoglobin 12/07/2016 11.9* 12.0 - 16.0 g/dL Final  . HCT 12/07/2016 35.7  35.0 - 47.0 % Final  . MCV 12/07/2016 92.8  80.0 - 100.0 fL Final  . MCH 12/07/2016 30.9  26.0 - 34.0 pg Final  . MCHC 12/07/2016 33.2  32.0 - 36.0 g/dL Final  . RDW 12/07/2016 14.9* 11.5 - 14.5 % Final  . Platelets 12/07/2016 150  150 - 440 K/uL Final  . Neutrophils Relative % 12/07/2016 57  % Final  . Neutro Abs 12/07/2016 3.9  1.4 - 6.5 K/uL Final  . Lymphocytes Relative 12/07/2016 29  % Final  . Lymphs Abs 12/07/2016 2.0  1.0 - 3.6 K/uL Final  . Monocytes Relative 12/07/2016 10  % Final  . Monocytes Absolute 12/07/2016 0.7  0.2 - 0.9 K/uL Final  . Eosinophils Relative 12/07/2016 3  % Final  . Eosinophils Absolute 12/07/2016 0.2  0 - 0.7 K/uL Final  . Basophils Relative 12/07/2016 1  % Final  . Basophils Absolute 12/07/2016 0.1  0 - 0.1 K/uL Final  . Ferritin 12/07/2016 69  11 - 307 ng/mL Final  . Iron 12/07/2016 59  28 - 170 ug/dL Final  . TIBC 12/07/2016 251  250 - 450 ug/dL Final  . Saturation Ratios 12/07/2016 24  10.4 - 31.8 % Final  . UIBC 12/07/2016 192  ug/dL Final    Assessment:  ASHELYNN MARKS is a 81 y.o. female with mild iron deficiency anemia.  EGD and  colonoscopy on 10/10/2013 were negative per patient.  She has had reflux for the past 15-30 years.  She is on Prilosec.  Diet is poor.  She denies any pica.  Work-up on 07/14/2016 revealed a hematocrit of 35.5, hemoglobin 11.6, MCV 87.6, platelets 173,000, WBC 7900 with an ANC of 5000.  SPEP revealed no monoclonal protein.  Ferritin was 22 (low).  Folate was 9.8.  Guaiac cards were negative x 3.  She is on oral iron.  She has a history of stage III renal cell carcinoma s/p robotic-assisted laparoscopic left radical nephrectomy on 01/09/2015. Pathology revealed a 6.3 cm renal cell carcinoma.  Tumor was predominantly clear cell with a minor component of type I papillary.  Fuhrman grade 3 of 4.  Tumor extended into the renal vein.  There was focal extension into the perirenal and sinus adipose tissue.  Margins were negative.  There was metastatic tumor in 2 of 2 lymph nodes.  Pathologic stage was [id number].    Abdomen and pelvic CT scan on 11/29/2014 revealed a 5.5 x 3.7 solid enhancing mass arising from the upper pole of the left kidney.  There was evidence of renal vein invasion.  There were borderline enlarged periaortic lymph node (9 mm).   Chest, abdomen, and pelvic CT on 08/16/2016 revealed left nephrectomy without evidence of metastatic disease.  There were low-attenuation lesions in the right kidney, likely cysts although definitive characterization is limited without post-contrast imaging.  There was an ectatic abdominal aorta at risk for aneurysm development.   She has chronic renal insufficiency.  Creatinine was 1.86 (CrCl 26 ml/min) on 06/15/2016.  She has a history of left lower extremity DVT.  She is s/p left AKA.  Symptomatically, she has congestion.  Exam reveals coarse breath sounds (right > left).  She is taking oral iron  BID.  Hematocrit is 35.7.  Ferritin is 69.  Plan: 1.  Review labs from yesterday.  Hematocrit normal.  Ferritin 69.  Continue oral iron until ferritin 100. 2.  CXR  today.  RTC after CXR. 3.  Chest, abdomen, and pelvic CT on 02/16/2017. 4.  Continue scheduled follow-up for renal cell carcinoma:  H&P every 3-6 months x 3 years then annually up to 5 years.  CMP every 6 months x 2 years then annually until year 5.  Chest, abdomen and pelvic CT scan every 3-6 months for 3 years then annually until year 5.  Bone scan if indicated.  Imaging after 5 years as indicated by exam or symptoms. 5.  RTC after CT scan for MD assessment, labs (CBC with diff, CMP, ferritin) and review of imaging.  Addendum:  CXR today reveals streaky airspace disease in the lingula which may represent atelectasis or pneumonia.  Dr Manuella Ghazi [phone]) contacted.  She will be seen in follow-up for likely initiation of antibiotics.   Lequita Asal, MD  12/08/2016, 1:54 PM

## 2016-12-08 NOTE — Telephone Encounter (Signed)
Patient sent for CXR.  Results reviewed by Dr. Mike Gip. Advised patient to follow up with PCP.  PCP contacted.  Patient scheduled for 11:40 am on 12-09-16.  PCP to treat and follow.

## 2016-12-08 NOTE — Progress Notes (Signed)
Patient here for follow up. She is congested today and her BP is low. O2 sats at 92%

## 2016-12-09 ENCOUNTER — Ambulatory Visit (INDEPENDENT_AMBULATORY_CARE_PROVIDER_SITE_OTHER): Payer: Medicare HMO | Admitting: Family Medicine

## 2016-12-09 ENCOUNTER — Encounter: Payer: Self-pay | Admitting: Family Medicine

## 2016-12-09 VITALS — BP 125/73 | HR 61 | Temp 97.9°F | Resp 16

## 2016-12-09 DIAGNOSIS — J189 Pneumonia, unspecified organism: Secondary | ICD-10-CM

## 2016-12-09 DIAGNOSIS — J181 Lobar pneumonia, unspecified organism: Secondary | ICD-10-CM | POA: Diagnosis not present

## 2016-12-09 MED ORDER — CLINDAMYCIN HCL 300 MG PO CAPS: 300.0000 mg | ORAL_CAPSULE | Freq: Four times a day (QID) | ORAL | 0 refills | Status: AC

## 2016-12-09 NOTE — Progress Notes (Signed)
Name: Amy [name]   MRN: [id number]    DOB: 08-19-1935   Date:12/09/2016       Progress Note  Subjective  Chief Complaint  Chief Complaint  Patient presents with  . Pneumonia    HPI  Patient presents for evaluation for possible pneumonia after she was referred from her oncology provider's office yesterday with an chest x-ray showing possible nummular atelectasis versus pneumonia. Patient has been in her usual state of health but has been feeling congested with some productive coughing, her husband has been giving her Mucinex which seems to help. She denies any fevers, chills, fatigue or other constitutional symptoms.  Past Medical History:  Diagnosis Date  . Anxiety   . Bilateral cataracts   . Bronchitis    hx of   . Cancer of kidney, left ([place]) 2017  . Cataract   . Chronic kidney disease   . Depression   . Diabetes mellitus without complication ([place])   . Dry cough   . 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 GERD (gastroesophageal reflux disease)   . History of frequent urinary tract infections   . Hyperlipidemia   . Hypertension   . Left renal mass   . Numbness and tingling    right hand   . Obesity   . Peripheral artery disease ([place])   . Urinary frequency   . Urinary incontinence     Past Surgical History:  Procedure Laterality Date  . ABDOMINAL HYSTERECTOMY  1978  . CARPAL TUNNEL RELEASE Bilateral   . CHOLECYSTECTOMY    . LEG AMPUTATION     above the knee / left   . ROBOT ASSISTED LAPAROSCOPIC NEPHRECTOMY Left 01/15/2015   Procedure: ROBOTIC ASSISTED LAPAROSCOPIC RADICAL NEPHRECTOMY;  Surgeon: Alexis Frock, MD;  Location: [facility];  Service: Urology;  Laterality: Left;  . stent placement right leg       Family History  Problem Relation Age of Onset  . Cancer Mother        Stomach  . Cancer Father   . Diabetes Brother   . Cancer Brother     Social History   Social History  . Marital status: Married    Spouse name: N/A  . Number of children: N/A  . Years of education: N/A   Occupational History  . Not on file.   Social History Main Topics  . Smoking status: Former Smoker    Packs/day: 1.50    Years: 51.00    Types: Cigarettes    Quit date: 05/24/2004  . Smokeless tobacco: Never Used  . Alcohol use No  . Drug use: No  . Sexual activity: Not Currently   Other Topics Concern  . Not on file   Social History Narrative  . No narrative on file     Current Outpatient Prescriptions:  .  acetaminophen (TYLENOL) 500 MG tablet, Take 500 mg by mouth every 6 (six) hours as needed. Pt takes 2 tablets as needed., Disp: , Rfl:  .  busPIRone (BUSPAR) 10 MG tablet, Take 10 mg by mouth 2 (two) times daily., Disp: , Rfl:  .  citalopram (CELEXA) 20 MG tablet, Take 1 tablet (20 mg total) by mouth at bedtime., Disp: 90 tablet, Rfl: 0 .  clopidogrel (PLAVIX) 75 MG tablet, Take 1 tablet (75 mg total) by mouth daily., Disp: 90 tablet, Rfl: 0 .  docusate sodium (COLACE) 100 MG capsule, Take 100 mg by  mouth daily. Pt taking once a day with breakfast, Disp: , Rfl:  .  ferrous sulfate 325 (65 FE) MG tablet, Take 1 tablet (325 mg total) by mouth 2 (two) times daily with a meal., Disp: 180 tablet, Rfl: 0 .  glucose blood test strip, Use as instructed., Disp: 100 each, Rfl: 12 .  HYDROcodone-acetaminophen (NORCO) 7.5-325 MG tablet, Take 1 tablet by mouth 3 (three) times daily as needed., Disp: 90 tablet, Rfl: 0 .  metoprolol (LOPRESSOR) 50 MG tablet, TAKE 1 TABLET TWICE DAILY, Disp: 180 tablet, Rfl: 0 .  Multiple Vitamins-Minerals (HAIR SKIN AND NAILS FORMULA) TABS, Take by mouth. One tablet daily, Disp: , Rfl:  .  pantoprazole (PROTONIX) 40 MG tablet, Take 1 tablet (40 mg total) by mouth daily., Disp: 90 tablet, Rfl: 1 .  pregabalin (LYRICA) 50 MG capsule, Take 1 capsule (50 mg total) by mouth 3 (three) times daily. May take up to 4 pills a day as needed., Disp: 120 capsule, Rfl: 0 .  QUEtiapine (SEROQUEL) 50  MG tablet, Take 50 mg by mouth., Disp: , Rfl:  .  simvastatin (ZOCOR) 40 MG tablet, Take 1 tablet (40 mg total) by mouth daily., Disp: 90 tablet, Rfl: 0 .  donepezil (ARICEPT) 5 MG tablet, Take 5 mg by mouth at bedtime., Disp: , Rfl:   Allergies  Allergen Reactions  . Fentanyl Other (See Comments)    urine retention  . Penicillins Itching and Rash     ROS  Please see history of present illness for complete discussion of ROS  Objective  Vitals:   12/09/16 1113  BP: 125/73  Pulse: 61  Resp: 16  Temp: 97.9 F (36.6 C)  TempSrc: Oral  SpO2: 93%    Physical Exam  Constitutional: She is well-developed, well-nourished, and in no distress.  HENT:  Head: Normocephalic and atraumatic.  Mouth/Throat: Oropharynx is clear and moist.  Cardiovascular: Normal rate, regular rhythm and normal heart sounds.   No murmur heard. Pulmonary/Chest: Effort normal. No respiratory distress. She has no decreased breath sounds. She has wheezes in the left lower field.  Abdominal: Soft. Bowel sounds are normal. There is no tenderness.  Neurological: She is alert.  Psychiatric: Mood, affect and judgment normal.  Nursing note and vitals reviewed.     Assessment & Plan  1. Community acquired pneumonia of left lower lobe of lung ([place]) Based on history and presentation with chest x-ray evidence, started on clindamycin, advised to increase fluid intake and may continue to take Mucinex - clindamycin (CLEOCIN) 300 MG capsule; Take 1 capsule (300 mg total) by mouth 4 (four) times daily.  Dispense: 40 capsule; Refill: 0   Amy [name] Asad A. [facility] 12/09/2016 11:44 AM

## 2016-12-15 ENCOUNTER — Ambulatory Visit (INDEPENDENT_AMBULATORY_CARE_PROVIDER_SITE_OTHER): Payer: Medicare HMO | Admitting: Family Medicine

## 2016-12-15 ENCOUNTER — Encounter: Payer: Self-pay | Admitting: Family Medicine

## 2016-12-15 DIAGNOSIS — R197 Diarrhea, unspecified: Secondary | ICD-10-CM

## 2016-12-15 DIAGNOSIS — G546 Phantom limb syndrome with pain: Secondary | ICD-10-CM

## 2016-12-15 LAB — COMPLETE METABOLIC PANEL WITH GFR
ALT: 9 U/L (ref 6–29)
AST: 14 U/L (ref 10–35)
Albumin: 3.4 g/dL — ABNORMAL LOW (ref 3.6–5.1)
Alkaline Phosphatase: 66 U/L (ref 33–130)
BUN: 18 mg/dL (ref 7–25)
CHLORIDE: 108 mmol/L (ref 98–110)
CO2: 22 mmol/L (ref 20–31)
CREATININE: 1.74 mg/dL — AB (ref 0.60–0.93)
Calcium: 8.3 mg/dL — ABNORMAL LOW (ref 8.6–10.4)
GFR, EST AFRICAN AMERICAN: 32 mL/min — AB (ref >=60)
GFR, Est Non African American: 28 mL/min — ABNORMAL LOW (ref >=60)
Glucose, Bld: 92 mg/dL (ref 65–99)
POTASSIUM: 4.8 mmol/L (ref 3.5–5.3)
Sodium: 141 mmol/L (ref 135–146)
Total Bilirubin: 0.4 mg/dL (ref 0.2–1.2)
Total Protein: 5.9 g/dL — ABNORMAL LOW (ref 6.1–8.1)

## 2016-12-15 LAB — CBC WITH DIFFERENTIAL/PLATELET
BASOS ABS: 75 {cells}/uL (ref 0–200)
Basophils Relative: 1 %
EOS ABS: 375 {cells}/uL (ref 15–500)
Eosinophils Relative: 5 %
HEMATOCRIT: 35.7 % (ref 35.0–45.0)
Hemoglobin: 11.5 g/dL — ABNORMAL LOW (ref 11.7–15.5)
LYMPHS PCT: 27 %
Lymphs Abs: 2025 cells/uL (ref 850–3900)
MCH: 30.7 pg (ref 27.0–33.0)
MCHC: 32.2 g/dL (ref 32.0–36.0)
MCV: 95.5 fL (ref 80.0–100.0)
MONO ABS: 750 {cells}/uL (ref 200–950)
MPV: 10.8 fL (ref 7.5–12.5)
Monocytes Relative: 10 %
NEUTROS PCT: 57 %
Neutro Abs: 4275 cells/uL (ref 1500–7800)
Platelets: 154 10*3/uL (ref 140–400)
RBC: 3.74 MIL/uL — ABNORMAL LOW (ref 3.80–5.10)
RDW: 15.2 % — AB (ref 11.0–15.0)
WBC: 7.5 10*3/uL (ref 3.8–10.8)

## 2016-12-15 MED ORDER — HYDROCODONE-ACETAMINOPHEN 7.5-325 MG PO TABS: 1.0000 | ORAL_TABLET | Freq: Three times a day (TID) | ORAL | 0 refills | Status: DC | PRN

## 2016-12-15 MED ORDER — PREGABALIN 50 MG PO CAPS: 50.0000 mg | ORAL_CAPSULE | Freq: Three times a day (TID) | ORAL | 0 refills | Status: DC

## 2016-12-15 NOTE — Progress Notes (Signed)
Name: Amy [name]   MRN: [id number]    DOB: 12-21-1935   Date:12/15/2016       Progress Note  Subjective  Chief Complaint  Chief Complaint  Patient presents with  . Follow-up    1 mo  . Medication Refill    Diarrhea   This is a new problem. The current episode started in the past 7 days (3-4 days ago). The problem has been unchanged. The stool consistency is described as watery and blood tinged (has small speck of blood on the toilet paper last night). The patient states that diarrhea does not awaken her from sleep. Pertinent negatives include no abdominal pain, chills, fever or vomiting. Nothing aggravates the symptoms. Risk factors include recent antibiotic use. She has tried nothing for the symptoms.     Phantom Limb Pain: Patient has chronic phantom limb pain since her left above knee amputation in 2007, has sensation that her left leg is painful, she takes Hydrocodone-Acetaminophen 7.5-325 mg three times daily as needed, now also taking Lyrica 50 mg three times daily as needed. Both medicines seem to be working well. No reported side effects.   Past Medical History:  Diagnosis Date  . Anxiety   . Bilateral cataracts   . Bronchitis    hx of   . Cancer of kidney, left ([place]) 2017  . Cataract   . Chronic kidney disease   . Depression   . Diabetes mellitus without complication ([place])   . Dry cough   . 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 GERD (gastroesophageal reflux disease)   . History of frequent urinary tract infections   . Hyperlipidemia   . Hypertension   . Left renal mass   . Numbness and tingling    right hand   . Obesity   . Peripheral artery disease ([place])   . Urinary frequency   . Urinary incontinence     Past Surgical History:  Procedure Laterality Date  . ABDOMINAL HYSTERECTOMY  1978  . CARPAL TUNNEL RELEASE Bilateral   . CHOLECYSTECTOMY    . LEG AMPUTATION     above the knee / left   . ROBOT  ASSISTED LAPAROSCOPIC NEPHRECTOMY Left 01/15/2015   Procedure: ROBOTIC ASSISTED LAPAROSCOPIC RADICAL NEPHRECTOMY;  Surgeon: Alexis Frock, MD;  Location: [facility];  Service: Urology;  Laterality: Left;  . stent placement right leg       Family History  Problem Relation Age of Onset  . Cancer Mother        Stomach  . Cancer Father   . Diabetes Brother   . Cancer Brother     Social History   Social History  . Marital status: Married    Spouse name: N/A  . Number of children: N/A  . Years of education: N/A   Occupational History  . Not on file.   Social History Main Topics  . Smoking status: Former Smoker    Packs/day: 1.50    Years: 51.00    Types: Cigarettes    Quit date: 05/24/2004  . Smokeless tobacco: Never Used  . Alcohol use No  . Drug use: No  . Sexual activity: Not Currently   Other Topics Concern  . Not on file   Social History Narrative  . No narrative on file     Current Outpatient Prescriptions:  .  acetaminophen (TYLENOL) 500 MG tablet, Take 500 mg by mouth every 6 (six)  hours as needed. Pt takes 2 tablets as needed., Disp: , Rfl:  .  busPIRone (BUSPAR) 10 MG tablet, Take 10 mg by mouth 2 (two) times daily., Disp: , Rfl:  .  citalopram (CELEXA) 20 MG tablet, Take 1 tablet (20 mg total) by mouth at bedtime., Disp: 90 tablet, Rfl: 0 .  clindamycin (CLEOCIN) 300 MG capsule, Take 1 capsule (300 mg total) by mouth 4 (four) times daily., Disp: 40 capsule, Rfl: 0 .  clopidogrel (PLAVIX) 75 MG tablet, Take 1 tablet (75 mg total) by mouth daily., Disp: 90 tablet, Rfl: 0 .  docusate sodium (COLACE) 100 MG capsule, Take 100 mg by mouth daily. Pt taking once a day with breakfast, Disp: , Rfl:  .  ferrous sulfate 325 (65 FE) MG tablet, Take 1 tablet (325 mg total) by mouth 2 (two) times daily with a meal., Disp: 180 tablet, Rfl: 0 .  glucose blood test strip, Use as instructed., Disp: 100 each, Rfl: 12 .  HYDROcodone-acetaminophen (NORCO) 7.5-325 MG tablet, Take 1  tablet by mouth 3 (three) times daily as needed., Disp: 90 tablet, Rfl: 0 .  metoprolol (LOPRESSOR) 50 MG tablet, TAKE 1 TABLET TWICE DAILY, Disp: 180 tablet, Rfl: 0 .  pantoprazole (PROTONIX) 40 MG tablet, Take 1 tablet (40 mg total) by mouth daily., Disp: 90 tablet, Rfl: 1 .  pregabalin (LYRICA) 50 MG capsule, Take 1 capsule (50 mg total) by mouth 3 (three) times daily. May take up to 4 pills a day as needed., Disp: 120 capsule, Rfl: 0 .  QUEtiapine (SEROQUEL) 50 MG tablet, Take 50 mg by mouth., Disp: , Rfl:  .  simvastatin (ZOCOR) 40 MG tablet, Take 1 tablet (40 mg total) by mouth daily., Disp: 90 tablet, Rfl: 0 .  donepezil (ARICEPT) 5 MG tablet, Take 5 mg by mouth at bedtime., Disp: , Rfl:  .  Multiple Vitamins-Minerals (HAIR SKIN AND NAILS FORMULA) TABS, Take by mouth. One tablet daily, Disp: , Rfl:   Allergies  Allergen Reactions  . Fentanyl Other (See Comments)    urine retention  . Penicillins Itching and Rash     Review of Systems  Constitutional: Negative for chills and fever.  Gastrointestinal: Positive for diarrhea. Negative for abdominal pain and vomiting.      Objective  Vitals:   12/15/16 1434  BP: 127/70  Pulse: 60  Resp: 16  Temp: 97.8 F (36.6 C)  TempSrc: Oral  SpO2: 94%    Physical Exam  Constitutional: She is oriented to person, place, and time and well-developed, well-nourished, and in no distress.  HENT:  Head: Normocephalic and atraumatic.  Cardiovascular: Normal rate, regular rhythm and normal heart sounds.   No murmur heard. Pulmonary/Chest: Effort normal and breath sounds normal. She has no wheezes.  Abdominal: Soft. Bowel sounds are normal. There is no tenderness.  Musculoskeletal: She exhibits no edema.  S/p above left knee amputation.   Neurological: She is alert and oriented to person, place, and time.  Psychiatric: Mood, memory, affect and judgment normal.  Nursing note and vitals reviewed.    Assessment & Plan  1. Diarrhea in  adult patient Suspect C. difficile colitis, obtained testing and start on appropriate anti-microbial treatment - C. difficile GDH and Toxin A/B - Stool Culture - CBC with Differential/Platelet - COMPLETE METABOLIC PANEL WITH GFR  2. 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 May take up to 4 pills a day as needed.  Dispense: 120 capsule; Refill: 0 - HYDROcodone-acetaminophen (NORCO) 7.5-325 MG tablet; Take 1 tablet by mouth 3 (three) times daily as needed.  Dispense: 90 tablet; Refill: 0   Marylou Wages Asad A. [facility] 12/15/2016 2:46 PM

## 2016-12-17 ENCOUNTER — Other Ambulatory Visit: Payer: Self-pay | Admitting: Family Medicine

## 2016-12-17 DIAGNOSIS — R197 Diarrhea, unspecified: Secondary | ICD-10-CM | POA: Diagnosis not present

## 2016-12-18 LAB — C. DIFFICILE GDH AND TOXIN A/B
C. difficile GDH: NOT DETECTED
C. difficile Toxin A/B: NOT DETECTED

## 2016-12-21 ENCOUNTER — Ambulatory Visit (INDEPENDENT_AMBULATORY_CARE_PROVIDER_SITE_OTHER): Payer: Medicare HMO | Admitting: Vascular Surgery

## 2016-12-21 ENCOUNTER — Ambulatory Visit (INDEPENDENT_AMBULATORY_CARE_PROVIDER_SITE_OTHER): Payer: Medicare HMO

## 2016-12-21 ENCOUNTER — Encounter (INDEPENDENT_AMBULATORY_CARE_PROVIDER_SITE_OTHER): Payer: Self-pay | Admitting: Vascular Surgery

## 2016-12-21 VITALS — BP 135/68 | HR 54 | Resp 16

## 2016-12-21 DIAGNOSIS — I739 Peripheral vascular disease, unspecified: Secondary | ICD-10-CM

## 2016-12-21 DIAGNOSIS — E1122 Type 2 diabetes mellitus with diabetic chronic kidney disease: Secondary | ICD-10-CM | POA: Diagnosis not present

## 2016-12-21 DIAGNOSIS — S31125A Laceration of abdominal wall with foreign body, periumbilic region without penetration into peritoneal cavity, initial encounter: Secondary | ICD-10-CM | POA: Diagnosis not present

## 2016-12-21 DIAGNOSIS — I10 Essential (primary) hypertension: Secondary | ICD-10-CM

## 2016-12-21 DIAGNOSIS — N183 Chronic kidney disease, stage 3 unspecified: Secondary | ICD-10-CM

## 2016-12-21 DIAGNOSIS — Z89612 Acquired absence of left leg above knee: Secondary | ICD-10-CM | POA: Diagnosis not present

## 2016-12-21 LAB — STOOL CULTURE

## 2016-12-21 NOTE — Assessment & Plan Note (Signed)
Her noninvasive studies today demonstrate stable ABI of 0.60 with a better digital pressure then she had her last visit. Her digital pressure today is 68 and her waveform is pretty good. She is status post left above-knee amputation about 11 years ago. She remains a high risk patient with multiple previous interventions and I'll continue to follow her on 6 month intervals. She will call my office with any problems between now and then. Continue current medical regimen including Plavix.

## 2016-12-21 NOTE — Patient Instructions (Signed)

## 2016-12-21 NOTE — Progress Notes (Signed)
MRN : [id number]  Amy [name] is a 81 y.o. (1936/01/05) female who presents with chief complaint of  Chief Complaint  Patient presents with  . ultrasound follow up  .  History of Present Illness: Patient returns today in follow up of PAD. She is stable with no new changes or issues.  She does not have ischemic rest pain or ulceration of the right leg. Her noninvasive studies today demonstrate stable ABI of 0.60 with a better digital pressure then she had her last visit. Her digital pressure today is 68 and her waveform is pretty good. She is status post left above-knee amputation about 11 years ago.         Current Outpatient Prescriptions  Medication Sig Dispense Refill  . acetaminophen (TYLENOL) 500 MG tablet Take 500 mg by mouth every 6 (six) hours as needed. Pt takes 2 tablets as needed.    . ALPRAZolam (XANAX) 0.5 MG tablet Take 1 tablet (0.5 mg total) by mouth 3 (three) times daily as needed for anxiety. 90 tablet 0  . citalopram (CELEXA) 20 MG tablet TAKE 1 TABLET AT BEDTIME 90 tablet 0  . clopidogrel (PLAVIX) 75 MG tablet TAKE 1 TABLET EVERY DAY 90 tablet 0  . docusate sodium (COLACE) 100 MG capsule Take 100 mg by mouth daily. Pt taking once a day with breakfast    . gabapentin (NEURONTIN) 300 MG capsule Take 1 capsule (300 mg total) by mouth 3 (three) times daily. 270 capsule 1  . glucose blood test strip Use as instructed. 100 each 12  . HYDROcodone-acetaminophen (NORCO) 7.5-325 MG tablet Take 1 tablet by mouth 3 (three) times daily as needed. 90 tablet 0  . metoprolol (LOPRESSOR) 50 MG tablet TAKE 1 TABLET TWICE DAILY 180 tablet 0  . Multiple Vitamins-Minerals (HAIR SKIN AND NAILS FORMULA) TABS Take by mouth. One tablet daily    . pantoprazole (PROTONIX) 40 MG tablet TAKE 1 TABLET (40 MG TOTAL) BY MOUTH DAILY. 90 tablet 1  . QUEtiapine (SEROQUEL) 25 MG tablet Take 25 mg by mouth at bedtime. Then increase to 2 qhs    . ranitidine (ZANTAC) 150 MG capsule TAKE 1  CAPSULE (150 MG TOTAL) BY MOUTH AT BEDTIME. 90 capsule 1  . simvastatin (ZOCOR) 40 MG tablet TAKE 1 TABLET EVERY DAY 90 tablet 0  . VENTOLIN HFA 108 (90 BASE) MCG/ACT inhaler Inhale 1-2 puffs into the lungs every 4 (four) hours as needed for wheezing or shortness of breath.     . Vitamin D, Ergocalciferol, (DRISDOL) 50000 UNITS CAPS capsule      No current facility-administered medications for this visit.         Past Medical History:  Diagnosis Date  . Anxiety   . Bronchitis    hx of   . Cataract   . Chronic kidney disease   . Depression   . Diabetes mellitus without complication ([place])   . Dry cough   . 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 GERD (gastroesophageal reflux disease)   . History of frequent urinary tract infections   . Hyperlipidemia   . Hypertension   . Left renal mass   . Numbness and tingling    right hand   . Obesity   . Peripheral artery disease ([place])   . Urinary frequency   . Urinary incontinence          Past Surgical History:  Procedure  Laterality Date  . ABDOMINAL HYSTERECTOMY  1978  . CARPAL TUNNEL RELEASE Bilateral   . CHOLECYSTECTOMY    . LEG AMPUTATION     above the knee / left   . ROBOT ASSISTED LAPAROSCOPIC NEPHRECTOMY Left 01/15/2015   Procedure: ROBOTIC ASSISTED LAPAROSCOPIC RADICAL NEPHRECTOMY;  Surgeon: Alexis Frock, MD;  Location: [facility];  Service: Urology;  Laterality: Left;  . stent placement right leg       Social History      Social History  Substance Use Topics  . Smoking status: Former Smoker    Packs/day: 1.50    Years: 51.00    Types: Cigarettes    Quit date: 05/24/2004  . Smokeless tobacco: Never Used  . Alcohol use No    Family History       Family History  Problem Relation Age of Onset  . Cancer Mother     Stomach  . Cancer Father   . Diabetes Brother   . Cancer Brother          Allergies  Allergen  Reactions  . Fentanyl Other (See Comments)    urine retention  . Penicillins Itching and Rash     REVIEW OF SYSTEMS (Negative unless checked)  Constitutional: [] Weight loss  [] Fever  [] Chills Cardiac: [] Chest pain   [] Chest pressure   [] Palpitations   [] Shortness of breath when laying flat   [] Shortness of breath at rest   [] Shortness of breath with exertion. Vascular:  [x] Pain in legs with walking   [] Pain in legs at rest   [] Pain in legs when laying flat   [] Claudication   [] Pain in feet when walking  [] Pain in feet at rest  [] Pain in feet when laying flat   [] History of DVT   [] Phlebitis   [] Swelling in legs   [] Varicose veins   [] Non-healing ulcers Pulmonary:   [] Uses home oxygen   [] Productive cough   [] Hemoptysis   [] Wheeze  [] COPD   [] Asthma Neurologic:  [] Dizziness  [] Blackouts   [] Seizures   [] History of stroke   [] History of TIA  [] Aphasia   [] Temporary blindness   [] Dysphagia   [] Weakness or numbness in arms   [] Weakness or numbness in legs Musculoskeletal:  [] Arthritis   [] Joint swelling   [] Joint pain   [] Low back pain Hematologic:  [] Easy bruising  [] Easy bleeding   [] Hypercoagulable state   [] Anemic   Gastrointestinal:  [] Blood in stool   [] Vomiting blood  [] Gastroesophageal reflux/heartburn   [] Abdominal pain Genitourinary:  [] Chronic kidney disease   [] Difficult urination  [] Frequent urination  [] Burning with urination   [] Hematuria Skin:  [] Rashes   [] Ulcers   [] Wounds Psychological:  [] History of anxiety   []  History of major depression.    Physical Examination  BP 135/68   Pulse (!) 54   Resp 16  Gen:  WD/WN, NAD Head: [place]/AT, No temporalis wasting. Ear/Nose/Throat: Hearing grossly intact, nares w/o erythema or drainage, trachea midline Eyes: Conjunctiva clear. Sclera non-icteric Neck: Supple.  No JVD.  Pulmonary:  Good air movement, no use of accessory muscles.  Cardiac: RRR, normal S1, S2 Vascular:  Vessel Right Left  Radial Palpable Palpable                            PT Not Palpable Not Palpable  DP 1+ Palpable Not Palpable    Musculoskeletal: M/S 5/5 throughout. Left AKA. Uses a wheelchair. Neurologic: Sensation grossly intact in extremities.  Symmetrical.  Speech is fluent.  Psychiatric: Judgment intact, Mood & affect appropriate for pt's clinical situation. Dermatologic: No rashes or ulcers noted.  No cellulitis or open wounds    Labs Recent Results (from the past 2160 hour(s))  POCT Glucose (CBG)     Status: Normal   Collection Time: 10/15/16 10:43 AM  Result Value Ref Range   POC Glucose 81 70 - 99 mg/dl  POCT HgB A1C     Status: Normal   Collection Time: 10/15/16 10:46 AM  Result Value Ref Range   Hemoglobin A1C 5.6   Lipid panel     Status: Abnormal   Collection Time: 10/15/16 11:11 AM  Result Value Ref Range   Cholesterol 133 <200 mg/dL   Triglycerides 185 (H) <150 mg/dL   HDL 39 (L) >50 mg/dL   Total CHOL/HDL Ratio 3.4 <5.0 Ratio   VLDL 37 (H) <30 mg/dL   LDL Cholesterol 57 <100 mg/dL  CBC with Differential/Platelet     Status: Abnormal   Collection Time: 12/07/16  1:32 PM  Result Value Ref Range   WBC 6.8 3.6 - 11.0 K/uL   RBC 3.85 3.80 - 5.20 MIL/uL   Hemoglobin 11.9 (L) 12.0 - 16.0 g/dL   HCT 35.7 35.0 - 47.0 %   MCV 92.8 80.0 - 100.0 fL   MCH 30.9 26.0 - 34.0 pg   MCHC 33.2 32.0 - 36.0 g/dL   RDW 14.9 (H) 11.5 - 14.5 %   Platelets 150 150 - 440 K/uL   Neutrophils Relative % 57 %   Neutro Abs 3.9 1.4 - 6.5 K/uL   Lymphocytes Relative 29 %   Lymphs Abs 2.0 1.0 - 3.6 K/uL   Monocytes Relative 10 %   Monocytes Absolute 0.7 0.2 - 0.9 K/uL   Eosinophils Relative 3 %   Eosinophils Absolute 0.2 0 - 0.7 K/uL   Basophils Relative 1 %   Basophils Absolute 0.1 0 - 0.1 K/uL  Ferritin     Status: None   Collection Time: 12/07/16  1:32 PM  Result Value Ref Range   Ferritin 69 11 - 307 ng/mL  Kappa/lambda light chains     Status: Abnormal   Collection Time: 12/07/16  1:32 PM  Result Value Ref Range    Kappa free light chain 43.9 (H) 3.3 - 19.4 mg/L   Lamda free light chains 46.1 (H) 5.7 - 26.3 mg/L   Kappa, lamda light chain ratio 0.95 0.26 - 1.65    Comment: (NOTE) Performed At: [facility] [place], [place] [id number] Lindon Romp MD RK:[id number]   Iron and TIBC     Status: None   Collection Time: 12/07/16  1:32 PM  Result Value Ref Range   Iron 59 28 - 170 ug/dL   TIBC 251 250 - 450 ug/dL   Saturation Ratios 24 10.4 - 31.8 %   UIBC 192 ug/dL  CBC with Differential/Platelet     Status: Abnormal   Collection Time: 12/15/16  3:13 PM  Result Value Ref Range   WBC 7.5 3.8 - 10.8 K/uL   RBC 3.74 (L) 3.80 - 5.10 MIL/uL   Hemoglobin 11.5 (L) 11.7 - 15.5 g/dL   HCT 35.7 35.0 - 45.0 %   MCV 95.5 80.0 - 100.0 fL   MCH 30.7 27.0 - 33.0 pg   MCHC 32.2 32.0 - 36.0 g/dL   RDW 15.2 (H) 11.0 - 15.0 %   Platelets 154 140 - 400 K/uL   MPV 10.8 7.5 -  12.5 fL   Neutro Abs 4,275 1,500 - 7,800 cells/uL   Lymphs Abs 2,025 850 - 3,900 cells/uL   Monocytes Absolute 750 200 - 950 cells/uL   Eosinophils Absolute 375 15 - 500 cells/uL   Basophils Absolute 75 0 - 200 cells/uL   Neutrophils Relative % 57 %   Lymphocytes Relative 27 %   Monocytes Relative 10 %   Eosinophils Relative 5 %   Basophils Relative 1 %   Smear Review Criteria for review not met   COMPLETE METABOLIC PANEL WITH GFR     Status: Abnormal   Collection Time: 12/15/16  3:13 PM  Result Value Ref Range   Sodium 141 135 - 146 mmol/L   Potassium 4.8 3.5 - 5.3 mmol/L   Chloride 108 98 - 110 mmol/L   CO2 22 20 - 31 mmol/L   Glucose, Bld 92 65 - 99 mg/dL   BUN 18 7 - 25 mg/dL   Creat 1.74 (H) 0.60 - 0.93 mg/dL    Comment:   For patients > or = 79 years of age: The upper reference limit for Creatinine is approximately 13% higher for people identified as African-American.      Total Bilirubin 0.4 0.2 - 1.2 mg/dL   Alkaline Phosphatase 66 33 - 130 U/L   AST 14 10 - 35 U/L   ALT 9 6 - 29 U/L    Total Protein 5.9 (L) 6.1 - 8.1 g/dL   Albumin 3.4 (L) 3.6 - 5.1 g/dL   Calcium 8.3 (L) 8.6 - 10.4 mg/dL   GFR, Est African American 32 (L) >=60 mL/min   GFR, Est Non African American 28 (L) >=60 mL/min  C. difficile GDH and Toxin A/B     Status: None   Collection Time: 12/17/16 11:10 AM  Result Value Ref Range   C. difficile GDH Not Detected    C. difficile Toxin A/B Not Detected     Comment: No Toxigenic C. difficile Detected   Source STOOL   Stool Culture     Status: None   Collection Time: 12/17/16 11:12 AM  Result Value Ref Range   Organism ID, Bacteria NO SALMONELLA OR SHIGELLA ISOLATED     Comment: NO ENTERIC CAMPYLOBACTER ISOLATED NO ESCHERICHIA COLI 0157 ISOLATED     Radiology Dg Chest 2 View  Result Date: 12/08/2016 CLINICAL DATA:  Cough.  History of nephrectomy for renal carcinoma EXAM: CHEST  2 VIEW COMPARISON:  02/03/2016 FINDINGS: Cardiac enlargement without heart failure. Atherosclerotic calcification aortic arch. Mild streaky airspace disease lingula is new and may represent atelectasis or pneumonitis. No pleural effusion. Chronic fracture T12 is unchanged. IMPRESSION: Streaky airspace disease in the lingula may represent atelectasis or pneumonia. Electronically Signed   By: Franchot Gallo M.D.   On: 12/08/2016 15:02     Assessment/Plan Diabetes mellitus type 2, controlled blood glucose control important in reducing the progression of atherosclerotic disease. Also, involved in wound healing. On appropriate medications.   BP (high blood pressure) blood pressure control important in reducing the progression of atherosclerotic disease. On appropriate oral medications.   S/P AKA (above knee amputation) unilateral, left ([place]) About 10 years ago. Stable and well healed. With her cognitive decline she is no longer able to use her prosthesis.  PVD (peripheral vascular disease) ([place]) Her noninvasive studies today demonstrate stable ABI of 0.60 with a better digital  pressure then she had her last visit. Her digital pressure today is 68 and her waveform is pretty good. She is  status post left above-knee amputation about 11 years ago. She remains a high risk patient with multiple previous interventions and I'll continue to follow her on 6 month intervals. She will call my office with any problems between now and then. Continue current medical regimen including Plavix.    Leotis Pain, MD  12/21/2016 2:36 PM    This note was created with Dragon medical transcription system.  Any errors from dictation are purely unintentional

## 2017-01-13 ENCOUNTER — Ambulatory Visit (INDEPENDENT_AMBULATORY_CARE_PROVIDER_SITE_OTHER): Payer: Medicare HMO | Admitting: Family Medicine

## 2017-01-13 ENCOUNTER — Encounter: Payer: Self-pay | Admitting: Family Medicine

## 2017-01-13 DIAGNOSIS — G546 Phantom limb syndrome with pain: Secondary | ICD-10-CM

## 2017-01-13 MED ORDER — PREGABALIN 50 MG PO CAPS: 50.0000 mg | ORAL_CAPSULE | Freq: Three times a day (TID) | ORAL | 0 refills | Status: DC

## 2017-01-13 MED ORDER — HYDROCODONE-ACETAMINOPHEN 7.5-325 MG PO TABS: 1.0000 | ORAL_TABLET | Freq: Three times a day (TID) | ORAL | 0 refills | Status: DC | PRN

## 2017-01-13 NOTE — Progress Notes (Signed)
Name: Amy [name]   MRN: [id number]    DOB: 05-22-1936   Date:01/13/2017       Progress Note  Subjective  Chief Complaint  Chief Complaint  Patient presents with  . Medication Refill    lyrica, norco, buspar    HPI  Phantom Limb Syndrome: Patient presents to follow up on phantom limb syndrome, described as burning fiery pain, reports the pain is worse now compared to last month. She reports taking Lyrica three times daily and Hydrocodone three times daily as needed. She reports pain relief with the above-mentioned medications.   Diarrhea: Her husband reports that diarrhea has resolved with discontinuation of oral iron, she was on ferrous sulfate 325 mg once daily, was increased to twice daily by hematologist and husband reported that the diarrhea got worse with increasing the dosage and she discontinued iron tablets which resulted in relief from diarrhea.     Past Medical History:  Diagnosis Date  . Anxiety   . Bilateral cataracts   . Bronchitis    hx of   . Cancer of kidney, left ([place]) 2017  . Cataract   . Chronic kidney disease   . Depression   . Diabetes mellitus without complication ([place])   . Dry cough   . 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 GERD (gastroesophageal reflux disease)   . History of frequent urinary tract infections   . Hyperlipidemia   . Hypertension   . Left renal mass   . Numbness and tingling    right hand   . Obesity   . Peripheral artery disease ([place])   . Urinary frequency   . Urinary incontinence     Past Surgical History:  Procedure Laterality Date  . ABDOMINAL HYSTERECTOMY  1978  . CARPAL TUNNEL RELEASE Bilateral   . CHOLECYSTECTOMY    . LEG AMPUTATION     above the knee / left   . ROBOT ASSISTED LAPAROSCOPIC NEPHRECTOMY Left 01/15/2015   Procedure: ROBOTIC ASSISTED LAPAROSCOPIC RADICAL NEPHRECTOMY;  Surgeon: Alexis Frock, MD;  Location: [facility];  Service: Urology;  Laterality: Left;   . stent placement right leg       Family History  Problem Relation Age of Onset  . Cancer Mother        Stomach  . Cancer Father   . Diabetes Brother   . Cancer Brother     Social History   Social History  . Marital status: Married    Spouse name: N/A  . Number of children: N/A  . Years of education: N/A   Occupational History  . Not on file.   Social History Main Topics  . Smoking status: Former Smoker    Packs/day: 1.50    Years: 51.00    Types: Cigarettes    Quit date: 05/24/2004  . Smokeless tobacco: Never Used  . Alcohol use No  . Drug use: No  . Sexual activity: Not Currently   Other Topics Concern  . Not on file   Social History Narrative  . No narrative on file     Current Outpatient Prescriptions:  .  acetaminophen (TYLENOL) 500 MG tablet, Take 500 mg by mouth every 6 (six) hours as needed. Pt takes 2 tablets as needed., Disp: , Rfl:  .  busPIRone (BUSPAR) 10 MG tablet, Take 10 mg by mouth 2 (two) times daily., Disp: , Rfl:  .  citalopram (CELEXA) 20 MG tablet,  Take 1 tablet (20 mg total) by mouth at bedtime., Disp: 90 tablet, Rfl: 0 .  clopidogrel (PLAVIX) 75 MG tablet, Take 1 tablet (75 mg total) by mouth daily., Disp: 90 tablet, Rfl: 0 .  docusate sodium (COLACE) 100 MG capsule, Take 100 mg by mouth daily. Pt taking once a day with breakfast, Disp: , Rfl:  .  donepezil (ARICEPT) 5 MG tablet, Take 5 mg by mouth at bedtime., Disp: , Rfl:  .  ferrous sulfate 325 (65 FE) MG tablet, Take 1 tablet (325 mg total) by mouth 2 (two) times daily with a meal., Disp: 180 tablet, Rfl: 0 .  glucose blood test strip, Use as instructed., Disp: 100 each, Rfl: 12 .  HYDROcodone-acetaminophen (NORCO) 7.5-325 MG tablet, Take 1 tablet by mouth 3 (three) times daily as needed., Disp: 90 tablet, Rfl: 0 .  metoprolol (LOPRESSOR) 50 MG tablet, TAKE 1 TABLET TWICE DAILY, Disp: 180 tablet, Rfl: 0 .  Multiple Vitamins-Minerals (HAIR SKIN AND NAILS FORMULA) TABS, Take by mouth. One  tablet daily, Disp: , Rfl:  .  pantoprazole (PROTONIX) 40 MG tablet, Take 1 tablet (40 mg total) by mouth daily., Disp: 90 tablet, Rfl: 1 .  pregabalin (LYRICA) 50 MG capsule, Take 1 capsule (50 mg total) by mouth 3 (three) times daily. May take up to 4 pills a day as needed., Disp: 120 capsule, Rfl: 0 .  QUEtiapine (SEROQUEL) 50 MG tablet, Take 50 mg by mouth., Disp: , Rfl:  .  simvastatin (ZOCOR) 40 MG tablet, Take 1 tablet (40 mg total) by mouth daily., Disp: 90 tablet, Rfl: 0  Allergies  Allergen Reactions  . Fentanyl Other (See Comments)    urine retention  . Penicillins Itching and Rash     ROS  Please see history of present illness for complete discussion of ROS  Objective  Vitals:   01/13/17 1145  BP: 132/64  Pulse: (!) 58  Resp: 16  Temp: 97.8 F (36.6 C)  TempSrc: Oral  SpO2: 92%    Physical Exam  Constitutional: She is oriented to person, place, and time and well-developed, well-nourished, and in no distress.  HENT:  Head: Normocephalic and atraumatic.  Cardiovascular: Normal rate, regular rhythm and normal heart sounds.   No murmur heard. Pulmonary/Chest: Effort normal and breath sounds normal. She has no wheezes.  Musculoskeletal: She exhibits no edema.  S/p above left knee amputation.   Neurological: She is alert and oriented to person, place, and time.  Psychiatric: Mood, memory, affect and judgment normal.  Nursing note and vitals reviewed.         Assessment & Plan  1. 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  Dispense: 90 tablet; Refill: 0 - pregabalin (LYRICA) 50 MG capsule; Take 1 capsule (50 mg total) by mouth 3 (three) times daily. May take up to 4 pills a day as needed.  Dispense: 120 capsule; Refill:  0   Jonn Chaikin [facility] 01/13/2017 12:00 PM

## 2017-01-21 DIAGNOSIS — Z89612 Acquired absence of left leg above knee: Secondary | ICD-10-CM | POA: Diagnosis not present

## 2017-01-21 DIAGNOSIS — S31125A Laceration of abdominal wall with foreign body, periumbilic region without penetration into peritoneal cavity, initial encounter: Secondary | ICD-10-CM | POA: Diagnosis not present

## 2017-01-25 ENCOUNTER — Other Ambulatory Visit: Payer: Self-pay | Admitting: Family Medicine

## 2017-01-25 DIAGNOSIS — E7800 Pure hypercholesterolemia, unspecified: Secondary | ICD-10-CM

## 2017-01-25 DIAGNOSIS — F3342 Major depressive disorder, recurrent, in full remission: Secondary | ICD-10-CM

## 2017-01-25 DIAGNOSIS — I739 Peripheral vascular disease, unspecified: Secondary | ICD-10-CM

## 2017-01-27 ENCOUNTER — Ambulatory Visit
Admission: RE | Admit: 2017-01-27 | Discharge: 2017-01-27 | Disposition: A | Discharge status: Home / Self Care | Payer: Medicare HMO | Source: Ambulatory Visit | Attending: Family Medicine | Admitting: Family Medicine

## 2017-01-27 ENCOUNTER — Encounter: Payer: Self-pay | Admitting: Family Medicine

## 2017-01-27 ENCOUNTER — Ambulatory Visit (INDEPENDENT_AMBULATORY_CARE_PROVIDER_SITE_OTHER): Payer: Medicare HMO | Admitting: Family Medicine

## 2017-01-27 VITALS — BP 131/76 | HR 60 | Temp 97.7°F | Resp 16

## 2017-01-27 DIAGNOSIS — M25511 Pain in right shoulder: Secondary | ICD-10-CM

## 2017-01-27 DIAGNOSIS — M25531 Pain in right wrist: Secondary | ICD-10-CM | POA: Insufficient documentation

## 2017-01-27 DIAGNOSIS — M25532 Pain in left wrist: Secondary | ICD-10-CM | POA: Insufficient documentation

## 2017-01-27 DIAGNOSIS — I708 Atherosclerosis of other arteries: Secondary | ICD-10-CM | POA: Diagnosis not present

## 2017-01-27 DIAGNOSIS — M25512 Pain in left shoulder: Secondary | ICD-10-CM

## 2017-01-27 DIAGNOSIS — M545 Low back pain: Secondary | ICD-10-CM | POA: Insufficient documentation

## 2017-01-27 DIAGNOSIS — M47896 Other spondylosis, lumbar region: Secondary | ICD-10-CM | POA: Insufficient documentation

## 2017-01-27 DIAGNOSIS — G546 Phantom limb syndrome with pain: Secondary | ICD-10-CM | POA: Diagnosis not present

## 2017-01-27 DIAGNOSIS — M47897 Other spondylosis, lumbosacral region: Secondary | ICD-10-CM | POA: Diagnosis not present

## 2017-01-27 DIAGNOSIS — M5459 Other low back pain: Secondary | ICD-10-CM

## 2017-01-27 DIAGNOSIS — M4804 Spinal stenosis, thoracic region: Secondary | ICD-10-CM | POA: Diagnosis not present

## 2017-01-27 DIAGNOSIS — I700 Atherosclerosis of aorta: Secondary | ICD-10-CM | POA: Insufficient documentation

## 2017-01-27 MED ORDER — GABAPENTIN 300 MG PO CAPS: 300.0000 mg | ORAL_CAPSULE | Freq: Three times a day (TID) | ORAL | 0 refills | Status: DC

## 2017-01-27 NOTE — Progress Notes (Signed)
Name: Amy [name]   MRN: [id number]    DOB: 11-26-1935   Date:01/27/2017       Progress Note  Subjective  Chief Complaint  Chief Complaint  Patient presents with  . Joint Pain    HPI  Pt. Presents for evaluation of generalized musculo-skeletal pain, most prominent in her bilateral wrists, bilateral shoulders and lower back. She has been complaining of the above-mentioned pain for sometime, but its getting worse. She reports laying in the bed makes her back get worse so she sometimes sleeps in the chair. She has gotten a new mattress but her back pain is still persistent. Her wrists and shoulders hurt when she moves such as transferring from chair to recliner etc.  She denies any fevers, chills, fatigue, seems to be sleeping well.   Past Medical History:  Diagnosis Date  . Anxiety   . Bilateral cataracts   . Bronchitis    hx of   . Cancer of kidney, left ([place]) 2017  . Cataract   . Chronic kidney disease   . Depression   . Diabetes mellitus without complication ([place])   . Dry cough   . 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 GERD (gastroesophageal reflux disease)   . History of frequent urinary tract infections   . Hyperlipidemia   . Hypertension   . Left renal mass   . Numbness and tingling    right hand   . Obesity   . Peripheral artery disease ([place])   . Urinary frequency   . Urinary incontinence     Past Surgical History:  Procedure Laterality Date  . ABDOMINAL HYSTERECTOMY  1978  . CARPAL TUNNEL RELEASE Bilateral   . CHOLECYSTECTOMY    . LEG AMPUTATION     above the knee / left   . ROBOT ASSISTED LAPAROSCOPIC NEPHRECTOMY Left 01/15/2015   Procedure: ROBOTIC ASSISTED LAPAROSCOPIC RADICAL NEPHRECTOMY;  Surgeon: Alexis Frock, MD;  Location: [facility];  Service: Urology;  Laterality: Left;  . stent placement right leg       Family History  Problem Relation Age of Onset  . Cancer Mother        Stomach  . Cancer  Father   . Diabetes Brother   . Cancer Brother     Social History   Social History  . Marital status: Married    Spouse name: N/A  . Number of children: N/A  . Years of education: N/A   Occupational History  . Not on file.   Social History Main Topics  . Smoking status: Former Smoker    Packs/day: 1.50    Years: 51.00    Types: Cigarettes    Quit date: 05/24/2004  . Smokeless tobacco: Never Used  . Alcohol use No  . Drug use: No  . Sexual activity: Not Currently   Other Topics Concern  . Not on file   Social History Narrative  . No narrative on file     Current Outpatient Prescriptions:  .  acetaminophen (TYLENOL) 500 MG tablet, Take 500 mg by mouth every 6 (six) hours as needed. Pt takes 2 tablets as needed., Disp: , Rfl:  .  busPIRone (BUSPAR) 10 MG tablet, Take 10 mg by mouth 2 (two) times daily., Disp: , Rfl:  .  citalopram (CELEXA) 20 MG tablet, Take 1 tablet (20 mg total) by mouth at bedtime., Disp: 90 tablet, Rfl: 0 .  clopidogrel (PLAVIX)  75 MG tablet, TAKE 1 TABLET EVERY DAY, Disp: 90 tablet, Rfl: 0 .  docusate sodium (COLACE) 100 MG capsule, Take 100 mg by mouth daily. Pt taking once a day with breakfast, Disp: , Rfl:  .  ferrous sulfate 325 (65 FE) MG tablet, Take 1 tablet (325 mg total) by mouth 2 (two) times daily with a meal., Disp: 180 tablet, Rfl: 0 .  glucose blood test strip, Use as instructed., Disp: 100 each, Rfl: 12 .  HYDROcodone-acetaminophen (NORCO) 7.5-325 MG tablet, Take 1 tablet by mouth 3 (three) times daily as needed., Disp: 90 tablet, Rfl: 0 .  metoprolol (LOPRESSOR) 50 MG tablet, TAKE 1 TABLET TWICE DAILY, Disp: 180 tablet, Rfl: 0 .  Multiple Vitamins-Minerals (HAIR SKIN AND NAILS FORMULA) TABS, Take by mouth. One tablet daily, Disp: , Rfl:  .  pantoprazole (PROTONIX) 40 MG tablet, Take 1 tablet (40 mg total) by mouth daily., Disp: 90 tablet, Rfl: 1 .  pregabalin (LYRICA) 50 MG capsule, Take 1 capsule (50 mg total) by mouth 3 (three) times  daily. May take up to 4 pills a day as needed., Disp: 120 capsule, Rfl: 0 .  QUEtiapine (SEROQUEL) 50 MG tablet, Take 50 mg by mouth., Disp: , Rfl:  .  simvastatin (ZOCOR) 40 MG tablet, TAKE 1 TABLET EVERY DAY, Disp: 90 tablet, Rfl: 0 .  donepezil (ARICEPT) 5 MG tablet, Take 5 mg by mouth at bedtime., Disp: , Rfl:   Allergies  Allergen Reactions  . Fentanyl Other (See Comments)    urine retention  . Penicillins Itching and Rash     ROS  Please see history of present illness for complete discussion of ROS  Objective  Vitals:   01/27/17 1328  BP: 131/76  Pulse: 60  Resp: 16  Temp: 97.7 F (36.5 C)  TempSrc: Oral  SpO2: 93%    Physical Exam  Constitutional: She is well-developed, well-nourished, and in no distress.  Cardiovascular: Normal rate, regular rhythm and normal heart sounds.   No murmur heard. Pulmonary/Chest: Effort normal. She has decreased breath sounds in the right lower field and the left lower field. She has wheezes in the right middle field and the right lower field.  Abdominal: Soft. Bowel sounds are normal. There is no tenderness.  Musculoskeletal:       Right shoulder: Normal. She exhibits normal range of motion and no tenderness.       Left shoulder: Normal. She exhibits normal range of motion and no tenderness.       Right wrist: She exhibits tenderness. She exhibits no swelling, no effusion and no crepitus.       Left wrist: She exhibits tenderness. She exhibits no swelling, no effusion and no crepitus.       Lumbar back: She exhibits tenderness, pain and spasm.  Skin: Skin is warm, dry and intact.  Psychiatric: Mood, memory, affect and judgment normal.  Nursing note and vitals reviewed.    Assessment & Plan  1. Arthralgia of lumbar spine Suspect osteoarthritis versus deconditioning because of poor posture and lack of activity, obtain x-rays and follow-up - CBC with Differential/Platelet - DG Lumbar Spine Complete; Future  2. Pain of both  shoulder joints We will rule out autoimmune diseases, rheumatoid arthritis and other sources of inflammation, obtain x-rays to rule out osteoarthritis - Sed Rate (ESR) - Rheumatoid Factor - ANA Direct w/Reflex if Positive - COMPLETE METABOLIC PANEL WITH GFR - DG Shoulder Left; Future - DG Shoulder Right; Future  3. Pain of  both wrist joints As above, rule out autoimmune diseases, obtain x-rays to rule out osteoarthritis - Sed Rate (ESR) - Rheumatoid Factor - ANA Direct w/Reflex if Positive - COMPLETE METABOLIC PANEL WITH GFR - DG Wrist Complete Left; Future - DG Wrist Complete Right; Future  4. Phantom limb syndrome with pain (HCC) Changed from Lyrica to gabapentin, advised to take 300 mg daily for first week, then increase to 300 mg twice daily for second week and 300 mg 3 times daily for week 3. - gabapentin (NEURONTIN) 300 MG capsule; Take 1 capsule (300 mg total) by mouth 3 (three) times daily.  Dispense: 270 capsule; Refill: 0   Montrice Montuori [facility] 01/27/2017 1:45 PM

## 2017-01-28 LAB — CBC WITH DIFFERENTIAL/PLATELET
BASOS PCT: 0.8 %
Basophils Absolute: 64 cells/uL (ref 0–200)
EOS PCT: 3.3 %
Eosinophils Absolute: 264 cells/uL (ref 15–500)
HEMATOCRIT: 35.1 % (ref 35.0–45.0)
Hemoglobin: 11.5 g/dL — ABNORMAL LOW (ref 11.7–15.5)
LYMPHS ABS: 2112 {cells}/uL (ref 850–3900)
MCH: 31.1 pg (ref 27.0–33.0)
MCHC: 32.8 g/dL (ref 32.0–36.0)
MCV: 94.9 fL (ref 80.0–100.0)
MPV: 11.9 fL (ref 7.5–12.5)
Monocytes Relative: 8.8 %
NEUTROS ABS: 4856 {cells}/uL (ref 1500–7800)
NEUTROS PCT: 60.7 %
Platelets: 148 10*3/uL (ref 140–400)
RBC: 3.7 10*6/uL — AB (ref 3.80–5.10)
RDW: 13.5 % (ref 11.0–15.0)
Total Lymphocyte: 26.4 %
WBC mixed population: 704 cells/uL (ref 200–950)
WBC: 8 10*3/uL (ref 3.8–10.8)

## 2017-01-28 LAB — COMPLETE METABOLIC PANEL WITH GFR
AG Ratio: 1.3 (calc) (ref 1.0–2.5)
ALKALINE PHOSPHATASE (APISO): 58 U/L (ref 33–130)
ALT: 9 U/L (ref 6–29)
AST: 14 U/L (ref 10–35)
Albumin: 3.2 g/dL — ABNORMAL LOW (ref 3.6–5.1)
BILIRUBIN TOTAL: 0.5 mg/dL (ref 0.2–1.2)
BUN/Creatinine Ratio: 10 (calc) (ref 6–22)
BUN: 19 mg/dL (ref 7–25)
CHLORIDE: 109 mmol/L (ref 98–110)
CO2: 28 mmol/L (ref 20–32)
Calcium: 8.4 mg/dL — ABNORMAL LOW (ref 8.6–10.4)
Creat: 1.83 mg/dL — ABNORMAL HIGH (ref 0.60–0.93)
GFR, EST AFRICAN AMERICAN: 30 mL/min/{1.73_m2} — AB (ref >=60)
GFR, Est Non African American: 26 mL/min/{1.73_m2} — ABNORMAL LOW (ref >=60)
GLUCOSE: 84 mg/dL (ref 65–99)
Globulin: 2.5 g/dL (calc) (ref 1.9–3.7)
Potassium: 4.8 mmol/L (ref 3.5–5.3)
Sodium: 143 mmol/L (ref 135–146)
TOTAL PROTEIN: 5.7 g/dL — AB (ref 6.1–8.1)

## 2017-01-28 LAB — ANA: Anti Nuclear Antibody(ANA): NEGATIVE

## 2017-01-28 LAB — RHEUMATOID FACTOR

## 2017-01-28 LAB — SEDIMENTATION RATE: Sed Rate: 19 mm/h (ref 0–30)

## 2017-02-09 ENCOUNTER — Other Ambulatory Visit: Payer: Self-pay | Admitting: Family Medicine

## 2017-02-11 ENCOUNTER — Ambulatory Visit (INDEPENDENT_AMBULATORY_CARE_PROVIDER_SITE_OTHER): Payer: Medicare HMO | Admitting: Family Medicine

## 2017-02-11 ENCOUNTER — Encounter: Payer: Self-pay | Admitting: Family Medicine

## 2017-02-11 VITALS — BP 131/71 | HR 60 | Temp 98.1°F | Resp 15

## 2017-02-11 DIAGNOSIS — F3342 Major depressive disorder, recurrent, in full remission: Secondary | ICD-10-CM | POA: Diagnosis not present

## 2017-02-11 DIAGNOSIS — G546 Phantom limb syndrome with pain: Secondary | ICD-10-CM | POA: Diagnosis not present

## 2017-02-11 DIAGNOSIS — I10 Essential (primary) hypertension: Secondary | ICD-10-CM | POA: Diagnosis not present

## 2017-02-11 MED ORDER — HYDROCODONE-ACETAMINOPHEN 7.5-325 MG PO TABS: 1.0000 | ORAL_TABLET | Freq: Three times a day (TID) | ORAL | 0 refills | Status: DC | PRN

## 2017-02-11 MED ORDER — METOPROLOL TARTRATE 50 MG PO TABS: 50.0000 mg | ORAL_TABLET | Freq: Two times a day (BID) | ORAL | 0 refills | Status: DC

## 2017-02-11 MED ORDER — CITALOPRAM HYDROBROMIDE 20 MG PO TABS: 20.0000 mg | ORAL_TABLET | Freq: Every day | ORAL | 0 refills | Status: DC

## 2017-02-11 NOTE — Progress Notes (Signed)
Name: Amy [name]   MRN: [id number]    DOB: 1935-11-16   Date:02/11/2017       Progress Note  Subjective  Chief Complaint  Chief Complaint  Patient presents with  . Follow-up    1 mo  . Medication Refill    Hypertension  This is a chronic problem. The problem is unchanged. The problem is controlled. Associated symptoms include headaches (recurrent headaches). Pertinent negatives include no blurred vision, chest pain, palpitations or shortness of breath. Past treatments include beta blockers. There is no history of kidney disease, CAD/MI or CVA.  Depression         This is a chronic problem.  The onset quality is gradual. The problem is unchanged.  Associated symptoms include fatigue, body aches, headaches (recurrent headaches) and sad.  Associated symptoms include no helplessness, no hopelessness and no decreased interest.  Past treatments include SSRIs - Selective serotonin reuptake inhibitors.  Compliance with treatment is good.  Phantom Limb Syndrome: Patient has phantom limb syndrome, s/p left AKA in 2007, now has recurrent pain in the affected area, she takes Norco 7.5-325 mg three times daily as needed for relief, we have added Gabapentin 300 mg three times daily to her regimen as well for neuropathic phantom limb pain.    Past Medical History:  Diagnosis Date  . Anxiety   . Bilateral cataracts   . Bronchitis    hx of   . Cancer of kidney, left ([place]) 2017  . Cataract   . Chronic kidney disease   . Depression   . Diabetes mellitus without complication ([place])   . Dry cough   . 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 GERD (gastroesophageal reflux disease)   . History of frequent urinary tract infections   . Hyperlipidemia   . Hypertension   . Left renal mass   . Numbness and tingling    right hand   . Obesity   . Peripheral artery disease ([place])   . Urinary frequency   . Urinary incontinence     Past Surgical History:   Procedure Laterality Date  . ABDOMINAL HYSTERECTOMY  1978  . CARPAL TUNNEL RELEASE Bilateral   . CHOLECYSTECTOMY    . LEG AMPUTATION     above the knee / left   . ROBOT ASSISTED LAPAROSCOPIC NEPHRECTOMY Left 01/15/2015   Procedure: ROBOTIC ASSISTED LAPAROSCOPIC RADICAL NEPHRECTOMY;  Surgeon: Alexis Frock, MD;  Location: [facility];  Service: Urology;  Laterality: Left;  . stent placement right leg       Family History  Problem Relation Age of Onset  . Cancer Mother        Stomach  . Cancer Father   . Diabetes Brother   . Cancer Brother     Social History   Social History  . Marital status: Married    Spouse name: N/A  . Number of children: N/A  . Years of education: N/A   Occupational History  . Not on file.   Social History Main Topics  . Smoking status: Former Smoker    Packs/day: 1.50    Years: 51.00    Types: Cigarettes    Quit date: 05/24/2004  . Smokeless tobacco: Never Used  . Alcohol use No  . Drug use: No  . Sexual activity: Not Currently   Other Topics Concern  . Not on file   Social History Narrative  . No narrative on file  Current Outpatient Prescriptions:  .  acetaminophen (TYLENOL) 500 MG tablet, Take 500 mg by mouth every 6 (six) hours as needed. Pt takes 2 tablets as needed., Disp: , Rfl:  .  busPIRone (BUSPAR) 10 MG tablet, Take 10 mg by mouth 2 (two) times daily., Disp: , Rfl:  .  citalopram (CELEXA) 20 MG tablet, Take 1 tablet (20 mg total) by mouth at bedtime., Disp: 90 tablet, Rfl: 0 .  clopidogrel (PLAVIX) 75 MG tablet, TAKE 1 TABLET EVERY DAY, Disp: 90 tablet, Rfl: 0 .  docusate sodium (COLACE) 100 MG capsule, Take 100 mg by mouth daily. Pt taking once a day with breakfast, Disp: , Rfl:  .  ferrous sulfate 325 (65 FE) MG tablet, Take 1 tablet (325 mg total) by mouth 2 (two) times daily with a meal., Disp: 180 tablet, Rfl: 0 .  gabapentin (NEURONTIN) 300 MG capsule, Take 1 capsule (300 mg total) by mouth 3 (three) times daily., Disp: 270  capsule, Rfl: 0 .  glucose blood test strip, Use as instructed., Disp: 100 each, Rfl: 12 .  HYDROcodone-acetaminophen (NORCO) 7.5-325 MG tablet, Take 1 tablet by mouth 3 (three) times daily as needed., Disp: 90 tablet, Rfl: 0 .  metoprolol tartrate (LOPRESSOR) 50 MG tablet, TAKE 1 TABLET TWICE DAILY, Disp: 180 tablet, Rfl: 0 .  Multiple Vitamins-Minerals (HAIR SKIN AND NAILS FORMULA) TABS, Take by mouth. One tablet daily, Disp: , Rfl:  .  pantoprazole (PROTONIX) 40 MG tablet, Take 1 tablet (40 mg total) by mouth daily., Disp: 90 tablet, Rfl: 1 .  QUEtiapine (SEROQUEL) 50 MG tablet, Take 50 mg by mouth., Disp: , Rfl:  .  simvastatin (ZOCOR) 40 MG tablet, TAKE 1 TABLET EVERY DAY, Disp: 90 tablet, Rfl: 0 .  donepezil (ARICEPT) 5 MG tablet, Take 5 mg by mouth at bedtime., Disp: , Rfl:   Allergies  Allergen Reactions  . Fentanyl Other (See Comments)    urine retention  . Penicillins Itching and Rash     Review of Systems  Constitutional: Positive for fatigue.  Eyes: Negative for blurred vision.  Respiratory: Negative for shortness of breath.   Cardiovascular: Negative for chest pain and palpitations.  Neurological: Positive for headaches (recurrent headaches).  Psychiatric/Behavioral: Positive for depression.      Objective  Vitals:   02/11/17 1047  BP: 131/71  Pulse: 60  Resp: 15  Temp: 98.1 F (36.7 C)  TempSrc: Oral  SpO2: 94%    Physical Exam  Constitutional: She is oriented to person, place, and time and well-developed, well-nourished, and in no distress.  HENT:  Head: Normocephalic and atraumatic.  Cardiovascular: Normal rate, regular rhythm and normal heart sounds.   No murmur heard. Pulmonary/Chest: Effort normal and breath sounds normal. She has no wheezes.  Musculoskeletal:  Left above-knee amputation  Neurological: She is alert and oriented to person, place, and time.  Psychiatric: Affect and judgment normal.  Nursing note and vitals  reviewed.      Recent Results (from the past 2160 hour(s))  CBC with Differential/Platelet     Status: Abnormal   Collection Time: 12/07/16  1:32 PM  Result Value Ref Range   WBC 6.8 3.6 - 11.0 K/uL   RBC 3.85 3.80 - 5.20 MIL/uL   Hemoglobin 11.9 (L) 12.0 - 16.0 g/dL   HCT 35.7 35.0 - 47.0 %   MCV 92.8 80.0 - 100.0 fL   MCH 30.9 26.0 - 34.0 pg   MCHC 33.2 32.0 - 36.0 g/dL   RDW 14.9 (  H) 11.5 - 14.5 %   Platelets 150 150 - 440 K/uL   Neutrophils Relative % 57 %   Neutro Abs 3.9 1.4 - 6.5 K/uL   Lymphocytes Relative 29 %   Lymphs Abs 2.0 1.0 - 3.6 K/uL   Monocytes Relative 10 %   Monocytes Absolute 0.7 0.2 - 0.9 K/uL   Eosinophils Relative 3 %   Eosinophils Absolute 0.2 0 - 0.7 K/uL   Basophils Relative 1 %   Basophils Absolute 0.1 0 - 0.1 K/uL  Ferritin     Status: None   Collection Time: 12/07/16  1:32 PM  Result Value Ref Range   Ferritin 69 11 - 307 ng/mL  Kappa/lambda light chains     Status: Abnormal   Collection Time: 12/07/16  1:32 PM  Result Value Ref Range   Kappa free light chain 43.9 (H) 3.3 - 19.4 mg/L   Lamda free light chains 46.1 (H) 5.7 - 26.3 mg/L   Kappa, lamda light chain ratio 0.95 0.26 - 1.65    Comment: (NOTE) Performed At: [facility] [place], [place] [id number] Lindon Romp MD KX:[id number]   Iron and TIBC     Status: None   Collection Time: 12/07/16  1:32 PM  Result Value Ref Range   Iron 59 28 - 170 ug/dL   TIBC 251 250 - 450 ug/dL   Saturation Ratios 24 10.4 - 31.8 %   UIBC 192 ug/dL  CBC with Differential/Platelet     Status: Abnormal   Collection Time: 12/15/16  3:13 PM  Result Value Ref Range   WBC 7.5 3.8 - 10.8 K/uL   RBC 3.74 (L) 3.80 - 5.10 MIL/uL   Hemoglobin 11.5 (L) 11.7 - 15.5 g/dL   HCT 35.7 35.0 - 45.0 %   MCV 95.5 80.0 - 100.0 fL   MCH 30.7 27.0 - 33.0 pg   MCHC 32.2 32.0 - 36.0 g/dL   RDW 15.2 (H) 11.0 - 15.0 %   Platelets 154 140 - 400 K/uL   MPV 10.8 7.5 - 12.5 fL   Neutro Abs 4,275  1,500 - 7,800 cells/uL   Lymphs Abs 2,025 850 - 3,900 cells/uL   Monocytes Absolute 750 200 - 950 cells/uL   Eosinophils Absolute 375 15 - 500 cells/uL   Basophils Absolute 75 0 - 200 cells/uL   Neutrophils Relative % 57 %   Lymphocytes Relative 27 %   Monocytes Relative 10 %   Eosinophils Relative 5 %   Basophils Relative 1 %   Smear Review Criteria for review not met   COMPLETE METABOLIC PANEL WITH GFR     Status: Abnormal   Collection Time: 12/15/16  3:13 PM  Result Value Ref Range   Sodium 141 135 - 146 mmol/L   Potassium 4.8 3.5 - 5.3 mmol/L   Chloride 108 98 - 110 mmol/L   CO2 22 20 - 31 mmol/L   Glucose, Bld 92 65 - 99 mg/dL   BUN 18 7 - 25 mg/dL   Creat 1.74 (H) 0.60 - 0.93 mg/dL    Comment:   For patients > or = 79 years of age: The upper reference limit for Creatinine is approximately 13% higher for people identified as African-American.      Total Bilirubin 0.4 0.2 - 1.2 mg/dL   Alkaline Phosphatase 66 33 - 130 U/L   AST 14 10 - 35 U/L   ALT 9 6 - 29 U/L   Total Protein 5.9 (L)  6.1 - 8.1 g/dL   Albumin 3.4 (L) 3.6 - 5.1 g/dL   Calcium 8.3 (L) 8.6 - 10.4 mg/dL   GFR, Est African American 32 (L) >=60 mL/min   GFR, Est Non African American 28 (L) >=60 mL/min  C. difficile GDH and Toxin A/B     Status: None   Collection Time: 12/17/16 11:10 AM  Result Value Ref Range   C. difficile GDH Not Detected    C. difficile Toxin A/B Not Detected     Comment: No Toxigenic C. difficile Detected   Source STOOL   Stool Culture     Status: None   Collection Time: 12/17/16 11:12 AM  Result Value Ref Range   Organism ID, Bacteria NO SALMONELLA OR SHIGELLA ISOLATED     Comment: NO ENTERIC CAMPYLOBACTER ISOLATED NO ESCHERICHIA COLI 0157 ISOLATED   CBC with Differential/Platelet     Status: Abnormal   Collection Time: 01/27/17  2:26 PM  Result Value Ref Range   WBC 8.0 3.8 - 10.8 Thousand/uL   RBC 3.70 (L) 3.80 - 5.10 Million/uL   Hemoglobin 11.5 (L) 11.7 - 15.5 g/dL   HCT  35.1 35.0 - 45.0 %   MCV 94.9 80.0 - 100.0 fL   MCH 31.1 27.0 - 33.0 pg   MCHC 32.8 32.0 - 36.0 g/dL   RDW 13.5 11.0 - 15.0 %   Platelets 148 140 - 400 Thousand/uL   MPV 11.9 7.5 - 12.5 fL   Neutro Abs 4,856 1,500 - 7,800 cells/uL   Lymphs Abs 2,112 850 - 3,900 cells/uL   WBC mixed population 704 200 - 950 cells/uL   Eosinophils Absolute 264 15 - 500 cells/uL   Basophils Absolute 64 0 - 200 cells/uL   Neutrophils Relative % 60.7 %   Total Lymphocyte 26.4 %   Monocytes Relative 8.8 %   Eosinophils Relative 3.3 %   Basophils Relative 0.8 %  Sed Rate (ESR)     Status: None   Collection Time: 01/27/17  2:26 PM  Result Value Ref Range   Sed Rate 19 0 - 30 mm/h  Rheumatoid Factor     Status: None   Collection Time: 01/27/17  2:26 PM  Result Value Ref Range   Rhuematoid fact SerPl-aCnc <14 <14 IU/mL  COMPLETE METABOLIC PANEL WITH GFR     Status: Abnormal   Collection Time: 01/27/17  2:26 PM  Result Value Ref Range   Glucose, Bld 84 65 - 99 mg/dL    Comment: .            Fasting reference interval .    BUN 19 7 - 25 mg/dL   Creat 1.83 (H) 0.60 - 0.93 mg/dL    Comment: For patients >51 years of age, the reference limit for Creatinine is approximately 13% higher for people identified as African-American. .    GFR, Est Non African American 26 (L) > OR = 60 mL/min/1.72m   GFR, Est African American 30 (L) > OR = 60 mL/min/1.772m  BUN/Creatinine Ratio 10 6 - 22 (calc)   Sodium 143 135 - 146 mmol/L   Potassium 4.8 3.5 - 5.3 mmol/L   Chloride 109 98 - 110 mmol/L   CO2 28 20 - 32 mmol/L   Calcium 8.4 (L) 8.6 - 10.4 mg/dL   Total Protein 5.7 (L) 6.1 - 8.1 g/dL   Albumin 3.2 (L) 3.6 - 5.1 g/dL   Globulin 2.5 1.9 - 3.7 g/dL (calc)   AG Ratio 1.3 1.0 -  2.5 (calc)   Total Bilirubin 0.5 0.2 - 1.2 mg/dL   Alkaline phosphatase (APISO) 58 33 - 130 U/L   AST 14 10 - 35 U/L   ALT 9 6 - 29 U/L  Antinuclear Antib (ANA)     Status: None   Collection Time: 01/27/17  4:25 PM  Result Value  Ref Range   Anit Nuclear Antibody(ANA) NEGATIVE NEGATIVE    Comment: ANA IFA is a first line screen for detecting the presence of up to approximately 150 autoantibodies in various autoimmune diseases. A negative ANA IFA result suggests ANA-associated autoimmune diseases are not present at this time. . Visit Physician FAQs for interpretation of all antibodies in the [place], prevalence, and association with diseases at http://education.[facility].com/ IOM/BTD974 .      Assessment & Plan  1. Recurrent major depressive disorder, in full remission ([place]) Stable on SSRI, refills provided - citalopram (CELEXA) 20 MG tablet; Take 1 tablet (20 mg total) by mouth at bedtime.  Dispense: 90 tablet; Refill: 0  2. 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  Dispense: 90 tablet; Refill: 0  3. Essential hypertension BP stable on present anti-hypertensive treatment - metoprolol tartrate (LOPRESSOR) 50 MG tablet; Take 1 tablet (50 mg total) by mouth 2 (two) times daily.  Dispense: 180 tablet; Refill: 0   Preethi Scantlebury [facility] 02/11/2017 11:12 AM

## 2017-02-16 ENCOUNTER — Ambulatory Visit
Admission: RE | Admit: 2017-02-16 | Discharge: 2017-02-16 | Disposition: A | Discharge status: Home / Self Care | Payer: Medicare HMO | Source: Ambulatory Visit | Attending: Hematology and Oncology | Admitting: Hematology and Oncology

## 2017-02-16 DIAGNOSIS — J439 Emphysema, unspecified: Secondary | ICD-10-CM | POA: Insufficient documentation

## 2017-02-16 DIAGNOSIS — C642 Malignant neoplasm of left kidney, except renal pelvis: Secondary | ICD-10-CM | POA: Insufficient documentation

## 2017-02-16 DIAGNOSIS — I2510 Atherosclerotic heart disease of native coronary artery without angina pectoris: Secondary | ICD-10-CM | POA: Insufficient documentation

## 2017-02-16 DIAGNOSIS — I700 Atherosclerosis of aorta: Secondary | ICD-10-CM | POA: Diagnosis not present

## 2017-02-16 DIAGNOSIS — R911 Solitary pulmonary nodule: Secondary | ICD-10-CM | POA: Insufficient documentation

## 2017-02-16 DIAGNOSIS — N289 Disorder of kidney and ureter, unspecified: Secondary | ICD-10-CM | POA: Insufficient documentation

## 2017-02-16 DIAGNOSIS — N200 Calculus of kidney: Secondary | ICD-10-CM | POA: Diagnosis not present

## 2017-02-16 DIAGNOSIS — C649 Malignant neoplasm of unspecified kidney, except renal pelvis: Secondary | ICD-10-CM | POA: Diagnosis not present

## 2017-02-16 DIAGNOSIS — D49519 Neoplasm of unspecified behavior of unspecified kidney: Secondary | ICD-10-CM

## 2017-02-16 DIAGNOSIS — Z905 Acquired absence of kidney: Secondary | ICD-10-CM | POA: Insufficient documentation

## 2017-02-22 NOTE — Progress Notes (Signed)
[facility] day:  02/23/2017  Chief Complaint: Amy [name] is a 81 y.o. female with iron deficiency anemia and a history of stage III renal cell carcinoma who is seen for 3 month assessment.  HPI:   The patient was last seen in the medical oncology clinic on 12/08/2016.  At that time, patient complained of fatigue. She described nasal congestion and "rattling" when she breathed. Chest x-ray was ordered that revealed possible pneumonia. PCP was contacted and patient was to be seen the following day. Patient was scheduled for a CT of her chest, abdomen, and pelvis on 02/16/2017.   CBC on 12/07/2016 revealed a hemoglobin of 11.9, hematocrit 35.7, MCV 92.8, and platelet count 150,000. Iron saturation was 24% with a TIBC of 251.  Ferritin was normal at 69. Kappa free light chains were 43.9, lambda free light chains 46.1 and ratio 0.95 (normal).  Abdomen and pelvic CT on 02/16/2017 revealed no evidence of recurrent disease.  Chest CT revealed a 7 x 5 mm right upper lobe pulmonary nodule (previously 6 x 4 mm). There was a 3 mm (previously 1-2 mm) left lower lobe pulmonary nodule.  Symptomatically, patient with a productive cough x 1 week. Patient coughing up white sputum. She is having RIGHT lateral rib pain from "coughing so much".  Patient denies fever. Oxygen saturations 93% today. Patient with audible expiratory wheezing. She denies nausea, vomiting, and diarrhea. No urinary symptoms.  Patient is no longer taking her iron supplements citing that it caused her to have diarrhea.    Past Medical History:  Diagnosis Date  . Anxiety   . Bilateral cataracts   . Bronchitis    hx of   . Cancer of kidney, left ([place]) 2017  . Cataract   . Chronic kidney disease   . Depression   . Diabetes mellitus without complication ([place])   . Dry cough   . 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 GERD (gastroesophageal  reflux disease)   . History of frequent urinary tract infections   . Hyperlipidemia   . Hypertension   . Left renal mass   . Numbness and tingling    right hand   . Obesity   . Peripheral artery disease ([place])   . Urinary frequency   . Urinary incontinence     Past Surgical History:  Procedure Laterality Date  . ABDOMINAL HYSTERECTOMY  1978  . CARPAL TUNNEL RELEASE Bilateral   . CHOLECYSTECTOMY    . LEG AMPUTATION     above the knee / left   . ROBOT ASSISTED LAPAROSCOPIC NEPHRECTOMY Left 01/15/2015   Procedure: ROBOTIC ASSISTED LAPAROSCOPIC RADICAL NEPHRECTOMY;  Surgeon: Alexis Frock, MD;  Location: [facility];  Service: Urology;  Laterality: Left;  . stent placement right leg       Family History  Problem Relation Age of Onset  . Cancer Mother        Stomach  . Cancer Father   . Diabetes Brother   . Cancer Brother     Social History:  reports that she quit smoking about 12 years ago. Her smoking use included Cigarettes. She has a 76.50 pack-year smoking history. She has never used smokeless tobacco. She reports that she does not drink alcohol or use drugs.  She is an Therapist, sports.  She lives in [place].  The patient is accompanied by her husband, Amy [name], today.  Allergies:  Allergies  Allergen Reactions  . Fentanyl Other (See Comments)    urine retention  . Penicillins Itching and Rash    Current Medications: Current Outpatient Prescriptions  Medication Sig Dispense Refill  . acetaminophen (TYLENOL) 500 MG tablet Take 500 mg by mouth every 6 (six) hours as needed. Pt takes 2 tablets as needed.    . busPIRone (BUSPAR) 10 MG tablet Take 10 mg by mouth 2 (two) times daily.    . citalopram (CELEXA) 20 MG tablet Take 1 tablet (20 mg total) by mouth at bedtime. 90 tablet 0  . clopidogrel (PLAVIX) 75 MG tablet TAKE 1 TABLET EVERY DAY 90 tablet 0  . docusate sodium (COLACE) 100 MG capsule Take 100 mg by mouth daily. Pt taking once a day with breakfast    . gabapentin (NEURONTIN) 300 MG  capsule Take 1 capsule (300 mg total) by mouth 3 (three) times daily. 270 capsule 0  . glucose blood test strip Use as instructed. 100 each 12  . HYDROcodone-acetaminophen (NORCO) 7.5-325 MG tablet Take 1 tablet by mouth 3 (three) times daily as needed. 90 tablet 0  . metoprolol tartrate (LOPRESSOR) 50 MG tablet Take 1 tablet (50 mg total) by mouth 2 (two) times daily. 180 tablet 0  . Multiple Vitamins-Minerals (HAIR SKIN AND NAILS FORMULA) TABS Take by mouth. One tablet daily    . pantoprazole (PROTONIX) 40 MG tablet Take 1 tablet (40 mg total) by mouth daily. 90 tablet 1  . QUEtiapine (SEROQUEL) 50 MG tablet Take 50 mg by mouth.    . simvastatin (ZOCOR) 40 MG tablet TAKE 1 TABLET EVERY DAY 90 tablet 0  . donepezil (ARICEPT) 5 MG tablet Take 5 mg by mouth at bedtime.    . ferrous sulfate 325 (65 FE) MG tablet Take 1 tablet (325 mg total) by mouth 2 (two) times daily with a meal. (Patient not taking: Reported on 02/23/2017) 180 tablet 0   No current facility-administered medications for this visit.     Review of Systems:  GENERAL:  Feels "tired".  No fevers or sweats.  No new weight.  PERFORMANCE STATUS (ECOG):  2-3 HEENT:  Runny nose.  No visual changes, sore throat, mouth sores or tenderness. Lungs: Congestion.  Rattling on breathing.  Cough.  No hemoptysis. Cardiac:  No chest pain, palpitations, orthopnea, or PND. GI:  Iron caused diarrhea.  No nausea, vomiting, diarrhea, constipation, or hematochezia. GU:  No urgency, frequency, dysuria, or hematuria. Musculoskeletal:  Left arm hurts on awakening.  Right 3rd finger hurts (no change).  Rib pain with coughing.  No back pain.  No joint pain.  No muscle tenderness. Extremities:  No pain or swelling. Skin:  No rashes or skin changes. Neuro:  No headache, numbness or weakness, balance or coordination issues. Endocrine:  No diabetes, thyroid issues, hot flashes or night sweats. Psych:  No mood changes, depression or anxiety. Pain:  No focal  pain. Review of systems:  All other systems reviewed and found to be negative.  Physical Exam: Blood pressure (!) 150/75, pulse (!) 59, temperature 98 F (36.7 C), temperature source Tympanic, resp. rate (!) 22. GENERAL:  Well developed, well nourished, woman sitting comfortably in a wheelchair in the exam room in no acute distress. MENTAL STATUS:  Alert and oriented to person, place and time. HEAD:  Graying hair.  Head tilt to the left.  Normocephalic, atraumatic, face symmetric, no Cushingoid features. EYES:  Glasses.  Brown eyes.  Pupils equal round and reactive to light and  accomodation.  No conjunctivitis or scleral icterus. ENT:  Oropharynx clear without lesion.  Tongue normal. Mucous membranes moist.  RESPIRATORY:  Audible breathing across room.  Coarse breath sounds (right > left). (+) scattered expiratory wheezing.  CARDIOVASCULAR:  Regular rate and rhythm without murmur, rub or gallop. ABDOMEN:  Soft, non-tender, with active bowel sounds, and no hepatosplenomegaly.  No masses. SKIN:  No rashes, ulcers or lesions. EXTREMITIES: Left sided above the knee amputation.  No edema, no skin discoloration or tenderness.  No palpable cords. LYMPH NODES: No palpable cervical, supraclavicular, axillary or inguinal adenopathy  NEUROLOGICAL: Unremarkable. PSYCH:  Appropriate.   Orders Only on 02/23/2017  Component Date Value Ref Range Status  . WBC 02/23/2017 9.3  3.6 - 11.0 K/uL Final  . RBC 02/23/2017 3.84  3.80 - 5.20 MIL/uL Final  . Hemoglobin 02/23/2017 11.9* 12.0 - 16.0 g/dL Final  . HCT 02/23/2017 35.8  35.0 - 47.0 % Final  . MCV 02/23/2017 93.2  80.0 - 100.0 fL Final  . MCH 02/23/2017 31.0  26.0 - 34.0 pg Final  . MCHC 02/23/2017 33.2  32.0 - 36.0 g/dL Final  . RDW 02/23/2017 14.1  11.5 - 14.5 % Final  . Platelets 02/23/2017 154  150 - 440 K/uL Final  . Neutrophils Relative % 02/23/2017 67  % Final  . Neutro Abs 02/23/2017 6.2  1.4 - 6.5 K/uL Final  . Lymphocytes Relative  02/23/2017 19  % Final  . Lymphs Abs 02/23/2017 1.8  1.0 - 3.6 K/uL Final  . Monocytes Relative 02/23/2017 9  % Final  . Monocytes Absolute 02/23/2017 0.8  0.2 - 0.9 K/uL Final  . Eosinophils Relative 02/23/2017 4  % Final  . Eosinophils Absolute 02/23/2017 0.4  0 - 0.7 K/uL Final  . Basophils Relative 02/23/2017 1  % Final  . Basophils Absolute 02/23/2017 0.1  0 - 0.1 K/uL Final  . Sodium 02/23/2017 139  135 - 145 mmol/L Final  . Potassium 02/23/2017 4.6  3.5 - 5.1 mmol/L Final  . Chloride 02/23/2017 105  101 - 111 mmol/L Final  . CO2 02/23/2017 27  22 - 32 mmol/L Final  . Glucose, Bld 02/23/2017 99  65 - 99 mg/dL Final  . BUN 02/23/2017 21* 6 - 20 mg/dL Final  . Creatinine, Ser 02/23/2017 1.70* 0.44 - 1.00 mg/dL Final  . Calcium 02/23/2017 8.0* 8.9 - 10.3 mg/dL Final  . Total Protein 02/23/2017 6.4* 6.5 - 8.1 g/dL Final  . Albumin 02/23/2017 3.1* 3.5 - 5.0 g/dL Final  . AST 02/23/2017 17  15 - 41 U/L Final  . ALT 02/23/2017 11* 14 - 54 U/L Final  . Alkaline Phosphatase 02/23/2017 70  38 - 126 U/L Final  . Total Bilirubin 02/23/2017 0.6  0.3 - 1.2 mg/dL Final  . GFR calc non Af Amer 02/23/2017 27* >60 mL/min Final  . GFR calc Af Amer 02/23/2017 32* >60 mL/min Final   Comment: (NOTE) The eGFR has been calculated using the CKD EPI equation. This calculation has not been validated in all clinical situations. eGFR's persistently <60 mL/min signify possible Chronic Kidney Disease.   . Anion gap 02/23/2017 7  5 - 15 Final    Assessment:  Amy [name] is a 81 y.o. female with mild iron deficiency anemia.  EGD and colonoscopy on 10/10/2013 were negative per patient.  She has had reflux for the past 15-30 years.  She is on Prilosec.  Diet is poor.  She denies any pica.  Work-up on 07/14/2016  revealed a hematocrit of 35.5, hemoglobin 11.6, MCV 87.6, platelets 173,000, WBC 7900 with an ANC of 5000.  SPEP revealed no monoclonal protein.  Ferritin was 22 (low).  Folate was 9.8.  Guaiac  cards were negative x 3.  She is on oral iron.  She has a history of stage III renal cell carcinoma s/p robotic-assisted laparoscopic left radical nephrectomy on 01/09/2015. Pathology revealed a 6.3 cm renal cell carcinoma.  Tumor was predominantly clear cell with a minor component of type I papillary.  Fuhrman grade 3 of 4.  Tumor extended into the renal vein.  There was focal extension into the perirenal and sinus adipose tissue.  Margins were negative.  There was metastatic tumor in 2 of 2 lymph nodes.  Pathologic stage was [id number].    Abdomen and pelvic CT scan on 11/29/2014 revealed a 5.5 x 3.7 solid enhancing mass arising from the upper pole of the left kidney.  There was evidence of renal vein invasion.  There were borderline enlarged periaortic lymph node (9 mm).   Chest, abdomen, and pelvic CT on 08/16/2016 revealed left nephrectomy without evidence of metastatic disease.  There were low-attenuation lesions in the right kidney, likely cysts although definitive characterization is limited without post-contrast imaging.  There was an ectatic abdominal aorta at risk for aneurysm development.   Chest, abdomen and pelvic CT on 02/16/2017 revealed no evidence of recurrent disease.  There was a 7 x 5 mm right upper lobe pulmonary nodule (previously 6 x 4 mm). There was a 3 mm (previously 1-2 mm) left lower lobe pulmonary nodule.  She has chronic renal insufficiency.  Creatinine was 1.86 (CrCl 26 ml/min) on 06/15/2016.  She has a history of left lower extremity DVT.  She is s/p left AKA.  Symptomatically, she has had a productive cough (white sputum) x 1 week. She has RIGHT lateral rib pain secondary to coughing. She is wheezing. SPO2 93% on room air.  Capillary refill is normal.  Patient is not in any respiratory distress.  She is no longer taking her oral iron. WBC 9,300 with an [place] of 6,200. Hemoglobin 11.9, hematocrit 35.8, platelets 154,000. Creatinine 1.70; GFR 27. Calcium 8.0.  Plan: 1.   Labs today: CBC with differential, CMP, ferritin 2.  Review interval chest, abdomen, and pelvis CT- small pulmonary nodules, similar in size.  Discuss ongoing surveillance. 3.  Continue scheduled follow-up for renal cell carcinoma:  H&P every 3-6 months x 3 years then annually up to 5 years.  CMP every 6 months x 2 years then annually until year 4.  Chest, abdomen and pelvic CT scan every 3-6 months for 3 years then annually until year 5.  Bone scan if indicated.  Imaging after 5 years as indicated by exam or symptoms. 4.  Schedule chest, abdomen, and pelvic CT scan on 08/16/2017. 5.  RTC in 6 months for MD assessment, labs (CBC with diff, CMP, ferritin).   Honor Loh, NP  02/23/2017, 2:33 PM   I saw and evaluated the patient, participating in the key portions of the service and reviewing pertinent diagnostic studies and records.  I reviewed the nurse practitioner's note and agree with the findings and the plan.  The assessment and plan were discussed with the patient.  A few questions were asked by the patient and answered.   Nolon Stalls, MD 02/23/2017,2:33 PM

## 2017-02-23 ENCOUNTER — Other Ambulatory Visit: Payer: Self-pay | Admitting: *Deleted

## 2017-02-23 ENCOUNTER — Inpatient Hospital Stay: Payer: Medicare HMO

## 2017-02-23 ENCOUNTER — Inpatient Hospital Stay: Payer: Medicare HMO | Attending: Hematology and Oncology | Admitting: Hematology and Oncology

## 2017-02-23 ENCOUNTER — Other Ambulatory Visit: Payer: Self-pay

## 2017-02-23 VITALS — BP 150/75 | HR 59 | Temp 98.0°F | Resp 22

## 2017-02-23 DIAGNOSIS — E785 Hyperlipidemia, unspecified: Secondary | ICD-10-CM | POA: Diagnosis not present

## 2017-02-23 DIAGNOSIS — Z86718 Personal history of other venous thrombosis and embolism: Secondary | ICD-10-CM | POA: Insufficient documentation

## 2017-02-23 DIAGNOSIS — D509 Iron deficiency anemia, unspecified: Secondary | ICD-10-CM | POA: Diagnosis not present

## 2017-02-23 DIAGNOSIS — Z87440 Personal history of urinary (tract) infections: Secondary | ICD-10-CM | POA: Insufficient documentation

## 2017-02-23 DIAGNOSIS — E1122 Type 2 diabetes mellitus with diabetic chronic kidney disease: Secondary | ICD-10-CM | POA: Diagnosis not present

## 2017-02-23 DIAGNOSIS — R062 Wheezing: Secondary | ICD-10-CM | POA: Diagnosis not present

## 2017-02-23 DIAGNOSIS — K219 Gastro-esophageal reflux disease without esophagitis: Secondary | ICD-10-CM

## 2017-02-23 DIAGNOSIS — R05 Cough: Secondary | ICD-10-CM | POA: Diagnosis not present

## 2017-02-23 DIAGNOSIS — R5383 Other fatigue: Secondary | ICD-10-CM | POA: Diagnosis not present

## 2017-02-23 DIAGNOSIS — D49519 Neoplasm of unspecified behavior of unspecified kidney: Secondary | ICD-10-CM

## 2017-02-23 DIAGNOSIS — Z89612 Acquired absence of left leg above knee: Secondary | ICD-10-CM | POA: Diagnosis not present

## 2017-02-23 DIAGNOSIS — I129 Hypertensive chronic kidney disease with stage 1 through stage 4 chronic kidney disease, or unspecified chronic kidney disease: Secondary | ICD-10-CM | POA: Diagnosis not present

## 2017-02-23 DIAGNOSIS — D508 Other iron deficiency anemias: Secondary | ICD-10-CM

## 2017-02-23 DIAGNOSIS — N189 Chronic kidney disease, unspecified: Secondary | ICD-10-CM | POA: Diagnosis not present

## 2017-02-23 DIAGNOSIS — Z85528 Personal history of other malignant neoplasm of kidney: Secondary | ICD-10-CM | POA: Diagnosis not present

## 2017-02-23 DIAGNOSIS — Z880 Allergy status to penicillin: Secondary | ICD-10-CM | POA: Insufficient documentation

## 2017-02-23 DIAGNOSIS — Z90710 Acquired absence of both cervix and uterus: Secondary | ICD-10-CM | POA: Insufficient documentation

## 2017-02-23 DIAGNOSIS — Z7902 Long term (current) use of antithrombotics/antiplatelets: Secondary | ICD-10-CM

## 2017-02-23 DIAGNOSIS — R918 Other nonspecific abnormal finding of lung field: Secondary | ICD-10-CM

## 2017-02-23 DIAGNOSIS — Z79899 Other long term (current) drug therapy: Secondary | ICD-10-CM

## 2017-02-23 LAB — CBC WITH DIFFERENTIAL/PLATELET
Basophils Absolute: 0.1 10*3/uL (ref 0–0.1)
Basophils Relative: 1 %
Eosinophils Absolute: 0.4 10*3/uL (ref 0–0.7)
Eosinophils Relative: 4 %
HCT: 35.8 % (ref 35.0–47.0)
Hemoglobin: 11.9 g/dL — ABNORMAL LOW (ref 12.0–16.0)
Lymphocytes Relative: 19 %
Lymphs Abs: 1.8 10*3/uL (ref 1.0–3.6)
MCH: 31 pg (ref 26.0–34.0)
MCHC: 33.2 g/dL (ref 32.0–36.0)
MCV: 93.2 fL (ref 80.0–100.0)
Monocytes Absolute: 0.8 10*3/uL (ref 0.2–0.9)
Monocytes Relative: 9 %
Neutro Abs: 6.2 10*3/uL (ref 1.4–6.5)
Neutrophils Relative %: 67 %
Platelets: 154 10*3/uL (ref 150–440)
RBC: 3.84 MIL/uL (ref 3.80–5.20)
RDW: 14.1 % (ref 11.5–14.5)
WBC: 9.3 10*3/uL (ref 3.6–11.0)

## 2017-02-23 LAB — COMPREHENSIVE METABOLIC PANEL
ALT: 11 U/L — ABNORMAL LOW (ref 14–54)
AST: 17 U/L (ref 15–41)
Albumin: 3.1 g/dL — ABNORMAL LOW (ref 3.5–5.0)
Alkaline Phosphatase: 70 U/L (ref 38–126)
Anion gap: 7 (ref 5–15)
BUN: 21 mg/dL — ABNORMAL HIGH (ref 6–20)
CO2: 27 mmol/L (ref 22–32)
Calcium: 8 mg/dL — ABNORMAL LOW (ref 8.9–10.3)
Chloride: 105 mmol/L (ref 101–111)
Creatinine, Ser: 1.7 mg/dL — ABNORMAL HIGH (ref 0.44–1.00)
GFR calc Af Amer: 32 mL/min — ABNORMAL LOW (ref >60)
GFR calc non Af Amer: 27 mL/min — ABNORMAL LOW (ref >60)
Glucose, Bld: 99 mg/dL (ref 65–99)
Potassium: 4.6 mmol/L (ref 3.5–5.1)
Sodium: 139 mmol/L (ref 135–145)
Total Bilirubin: 0.6 mg/dL (ref 0.3–1.2)
Total Protein: 6.4 g/dL — ABNORMAL LOW (ref 6.5–8.1)

## 2017-02-23 LAB — FERRITIN: Ferritin: 81 ng/mL (ref 11–307)

## 2017-02-23 NOTE — Progress Notes (Signed)
Patient states she has not felt well for over a week.  C/o pain 5/10 in right ribcage.  Patient also has a cough that has been going on for over a week.  States she stopped taking her iron because it constipated her.  She tried stool softeners but they caused her to have too many bowel movements and she became too sore to wipe with tissue paper.

## 2017-02-28 DIAGNOSIS — N184 Chronic kidney disease, stage 4 (severe): Secondary | ICD-10-CM | POA: Diagnosis not present

## 2017-02-28 DIAGNOSIS — C778 Secondary and unspecified malignant neoplasm of lymph nodes of multiple regions: Secondary | ICD-10-CM | POA: Diagnosis not present

## 2017-02-28 DIAGNOSIS — C642 Malignant neoplasm of left kidney, except renal pelvis: Secondary | ICD-10-CM | POA: Diagnosis not present

## 2017-02-28 DIAGNOSIS — Z905 Acquired absence of kidney: Secondary | ICD-10-CM | POA: Diagnosis not present

## 2017-03-04 ENCOUNTER — Encounter: Payer: Self-pay | Admitting: Family Medicine

## 2017-03-15 ENCOUNTER — Ambulatory Visit (INDEPENDENT_AMBULATORY_CARE_PROVIDER_SITE_OTHER): Payer: Medicare HMO | Admitting: Family Medicine

## 2017-03-15 ENCOUNTER — Encounter: Payer: Self-pay | Admitting: Family Medicine

## 2017-03-15 VITALS — BP 134/86 | HR 56 | Temp 98.1°F | Resp 16

## 2017-03-15 DIAGNOSIS — K219 Gastro-esophageal reflux disease without esophagitis: Secondary | ICD-10-CM

## 2017-03-15 DIAGNOSIS — G546 Phantom limb syndrome with pain: Secondary | ICD-10-CM | POA: Diagnosis not present

## 2017-03-15 MED ORDER — HYDROCODONE-ACETAMINOPHEN 7.5-325 MG PO TABS: 1.0000 | ORAL_TABLET | Freq: Three times a day (TID) | ORAL | 0 refills | Status: DC | PRN

## 2017-03-15 MED ORDER — RANITIDINE HCL 150 MG PO CAPS: 150.0000 mg | ORAL_CAPSULE | Freq: Every day | ORAL | 0 refills | Status: DC

## 2017-03-15 NOTE — Progress Notes (Signed)
Name: Amy [name]   MRN: [id number]    DOB: 01-29-1936   Date:03/15/2017       Progress Note  Subjective  Chief Complaint  Chief Complaint  Patient presents with  . Medication Refill    Pain med and nerve med and heartburn     Gastroesophageal Reflux  She reports no abdominal pain, no belching, no heartburn or no nausea. This is a chronic problem. Risk factors include obesity. She has tried a PPI and a histamine-2 antagonist for the symptoms.    Phantom Limb Syndrome: Patient is status post left above knee amputation in 2007, has Phantom limb syndrome with pain, overall stable, takes hydrocodone-acetaminophen 1 tablet 3 times daily as needed, this tends to help relieve the pain, no reported adverse effects.  Past Medical History:  Diagnosis Date  . Anxiety   . Bilateral cataracts   . Bronchitis    hx of   . Cancer of kidney, left ([place]) 2017  . Cataract   . Chronic kidney disease   . Depression   . Diabetes mellitus without complication ([place])   . Dry cough   . 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 GERD (gastroesophageal reflux disease)   . History of frequent urinary tract infections   . Hyperlipidemia   . Hypertension   . Left renal mass   . Numbness and tingling    right hand   . Obesity   . Peripheral artery disease ([place])   . Urinary frequency   . Urinary incontinence     Past Surgical History:  Procedure Laterality Date  . ABDOMINAL HYSTERECTOMY  1978  . CARPAL TUNNEL RELEASE Bilateral   . CHOLECYSTECTOMY    . LEG AMPUTATION     above the knee / left   . ROBOT ASSISTED LAPAROSCOPIC NEPHRECTOMY Left 01/15/2015   Procedure: ROBOTIC ASSISTED LAPAROSCOPIC RADICAL NEPHRECTOMY;  Surgeon: Alexis Frock, MD;  Location: [facility];  Service: Urology;  Laterality: Left;  . stent placement right leg       Family History  Problem Relation Age of Onset  . Cancer Mother        Stomach  . Cancer Father   . Diabetes  Brother   . Cancer Brother     Social History   Social History  . Marital status: Married    Spouse name: N/A  . Number of children: N/A  . Years of education: N/A   Occupational History  . Not on file.   Social History Main Topics  . Smoking status: Former Smoker    Packs/day: 1.50    Years: 51.00    Types: Cigarettes    Quit date: 05/24/2004  . Smokeless tobacco: Never Used  . Alcohol use No  . Drug use: No  . Sexual activity: Not Currently   Other Topics Concern  . Not on file   Social History Narrative  . No narrative on file     Current Outpatient Prescriptions:  .  acetaminophen (TYLENOL) 500 MG tablet, Take 500 mg by mouth every 6 (six) hours as needed. Pt takes 2 tablets as needed., Disp: , Rfl:  .  busPIRone (BUSPAR) 10 MG tablet, Take 10 mg by mouth 2 (two) times daily., Disp: , Rfl:  .  citalopram (CELEXA) 20 MG tablet, Take 1 tablet (20 mg total) by mouth at bedtime., Disp: 90 tablet, Rfl: 0 .  clopidogrel (PLAVIX) 75 MG tablet, TAKE  1 TABLET EVERY DAY, Disp: 90 tablet, Rfl: 0 .  docusate sodium (COLACE) 100 MG capsule, Take 100 mg by mouth daily. Pt taking once a day with breakfast, Disp: , Rfl:  .  donepezil (ARICEPT) 5 MG tablet, Take 5 mg by mouth at bedtime., Disp: , Rfl:  .  gabapentin (NEURONTIN) 300 MG capsule, Take 1 capsule (300 mg total) by mouth 3 (three) times daily., Disp: 270 capsule, Rfl: 0 .  glucose blood test strip, Use as instructed., Disp: 100 each, Rfl: 12 .  HYDROcodone-acetaminophen (NORCO) 7.5-325 MG tablet, Take 1 tablet by mouth 3 (three) times daily as needed., Disp: 90 tablet, Rfl: 0 .  metoprolol tartrate (LOPRESSOR) 50 MG tablet, Take 1 tablet (50 mg total) by mouth 2 (two) times daily., Disp: 180 tablet, Rfl: 0 .  Multiple Vitamins-Minerals (HAIR SKIN AND NAILS FORMULA) TABS, Take by mouth. One tablet daily, Disp: , Rfl:  .  pantoprazole (PROTONIX) 40 MG tablet, Take 1 tablet (40 mg total) by mouth daily., Disp: 90 tablet, Rfl:  1 .  QUEtiapine (SEROQUEL) 50 MG tablet, Take 50 mg by mouth., Disp: , Rfl:  .  ranitidine (ZANTAC) 150 MG capsule, Take 1 capsule by mouth at bedtime., Disp: , Rfl:  .  simvastatin (ZOCOR) 40 MG tablet, TAKE 1 TABLET EVERY DAY, Disp: 90 tablet, Rfl: 0 .  ferrous sulfate 325 (65 FE) MG tablet, Take 1 tablet (325 mg total) by mouth 2 (two) times daily with a meal. (Patient not taking: Reported on 02/23/2017), Disp: 180 tablet, Rfl: 0  Allergies  Allergen Reactions  . Fentanyl Other (See Comments)    urine retention  . Penicillins Itching and Rash     Review of Systems  Gastrointestinal: Negative for abdominal pain, heartburn and nausea.      Objective  Vitals:   03/15/17 1121  BP: 134/86  Pulse: (!) 56  Resp: 16  Temp: 98.1 F (36.7 C)  TempSrc: Oral  SpO2: 94%    Physical Exam  Constitutional: She is well-developed, well-nourished, and in no distress.  Cardiovascular: Normal rate, regular rhythm and normal heart sounds.   No murmur heard. Pulmonary/Chest: Effort normal and breath sounds normal. She has no wheezes.  Musculoskeletal:  Left AKA, normal stump  Neurological: She is alert.  Psychiatric: Mood, memory, affect and judgment normal.  Nursing note and vitals reviewed.    Recent Results (from the past 2160 hour(s))  CBC with Differential/Platelet     Status: Abnormal   Collection Time: 12/15/16  3:13 PM  Result Value Ref Range   WBC 7.5 3.8 - 10.8 K/uL   RBC 3.74 (L) 3.80 - 5.10 MIL/uL   Hemoglobin 11.5 (L) 11.7 - 15.5 g/dL   HCT 35.7 35.0 - 45.0 %   MCV 95.5 80.0 - 100.0 fL   MCH 30.7 27.0 - 33.0 pg   MCHC 32.2 32.0 - 36.0 g/dL   RDW 15.2 (H) 11.0 - 15.0 %   Platelets 154 140 - 400 K/uL   MPV 10.8 7.5 - 12.5 fL   Neutro Abs 4,275 1,500 - 7,800 cells/uL   Lymphs Abs 2,025 850 - 3,900 cells/uL   Monocytes Absolute 750 200 - 950 cells/uL   Eosinophils Absolute 375 15 - 500 cells/uL   Basophils Absolute 75 0 - 200 cells/uL   Neutrophils Relative % 57 %    Lymphocytes Relative 27 %   Monocytes Relative 10 %   Eosinophils Relative 5 %   Basophils Relative 1 %  Smear Review Criteria for review not met   COMPLETE METABOLIC PANEL WITH GFR     Status: Abnormal   Collection Time: 12/15/16  3:13 PM  Result Value Ref Range   Sodium 141 135 - 146 mmol/L   Potassium 4.8 3.5 - 5.3 mmol/L   Chloride 108 98 - 110 mmol/L   CO2 22 20 - 31 mmol/L   Glucose, Bld 92 65 - 99 mg/dL   BUN 18 7 - 25 mg/dL   Creat 1.74 (H) 0.60 - 0.93 mg/dL    Comment:   For patients > or = 79 years of age: The upper reference limit for Creatinine is approximately 13% higher for people identified as African-American.      Total Bilirubin 0.4 0.2 - 1.2 mg/dL   Alkaline Phosphatase 66 33 - 130 U/L   AST 14 10 - 35 U/L   ALT 9 6 - 29 U/L   Total Protein 5.9 (L) 6.1 - 8.1 g/dL   Albumin 3.4 (L) 3.6 - 5.1 g/dL   Calcium 8.3 (L) 8.6 - 10.4 mg/dL   GFR, Est African American 32 (L) >=60 mL/min   GFR, Est Non African American 28 (L) >=60 mL/min  C. difficile GDH and Toxin A/B     Status: None   Collection Time: 12/17/16 11:10 AM  Result Value Ref Range   C. difficile GDH Not Detected    C. difficile Toxin A/B Not Detected     Comment: No Toxigenic C. difficile Detected   Source STOOL   Stool Culture     Status: None   Collection Time: 12/17/16 11:12 AM  Result Value Ref Range   Organism ID, Bacteria NO SALMONELLA OR SHIGELLA ISOLATED     Comment: NO ENTERIC CAMPYLOBACTER ISOLATED NO ESCHERICHIA COLI 0157 ISOLATED   CBC with Differential/Platelet     Status: Abnormal   Collection Time: 01/27/17  2:26 PM  Result Value Ref Range   WBC 8.0 3.8 - 10.8 Thousand/uL   RBC 3.70 (L) 3.80 - 5.10 Million/uL   Hemoglobin 11.5 (L) 11.7 - 15.5 g/dL   HCT 35.1 35.0 - 45.0 %   MCV 94.9 80.0 - 100.0 fL   MCH 31.1 27.0 - 33.0 pg   MCHC 32.8 32.0 - 36.0 g/dL   RDW 13.5 11.0 - 15.0 %   Platelets 148 140 - 400 Thousand/uL   MPV 11.9 7.5 - 12.5 fL   Neutro Abs 4,856 1,500 -  7,800 cells/uL   Lymphs Abs 2,112 850 - 3,900 cells/uL   WBC mixed population 704 200 - 950 cells/uL   Eosinophils Absolute 264 15 - 500 cells/uL   Basophils Absolute 64 0 - 200 cells/uL   Neutrophils Relative % 60.7 %   Total Lymphocyte 26.4 %   Monocytes Relative 8.8 %   Eosinophils Relative 3.3 %   Basophils Relative 0.8 %  Sed Rate (ESR)     Status: None   Collection Time: 01/27/17  2:26 PM  Result Value Ref Range   Sed Rate 19 0 - 30 mm/h  Rheumatoid Factor     Status: None   Collection Time: 01/27/17  2:26 PM  Result Value Ref Range   Rhuematoid fact SerPl-aCnc <14 <14 IU/mL  COMPLETE METABOLIC PANEL WITH GFR     Status: Abnormal   Collection Time: 01/27/17  2:26 PM  Result Value Ref Range   Glucose, Bld 84 65 - 99 mg/dL    Comment: .  Fasting reference interval .    BUN 19 7 - 25 mg/dL   Creat 1.83 (H) 0.60 - 0.93 mg/dL    Comment: For patients >72 years of age, the reference limit for Creatinine is approximately 13% higher for people identified as African-American. .    GFR, Est Non African American 26 (L) > OR = 60 mL/min/1.58m   GFR, Est African American 30 (L) > OR = 60 mL/min/1.789m  BUN/Creatinine Ratio 10 6 - 22 (calc)   Sodium 143 135 - 146 mmol/L   Potassium 4.8 3.5 - 5.3 mmol/L   Chloride 109 98 - 110 mmol/L   CO2 28 20 - 32 mmol/L   Calcium 8.4 (L) 8.6 - 10.4 mg/dL   Total Protein 5.7 (L) 6.1 - 8.1 g/dL   Albumin 3.2 (L) 3.6 - 5.1 g/dL   Globulin 2.5 1.9 - 3.7 g/dL (calc)   AG Ratio 1.3 1.0 - 2.5 (calc)   Total Bilirubin 0.5 0.2 - 1.2 mg/dL   Alkaline phosphatase (APISO) 58 33 - 130 U/L   AST 14 10 - 35 U/L   ALT 9 6 - 29 U/L  Antinuclear Antib (ANA)     Status: None   Collection Time: 01/27/17  4:25 PM  Result Value Ref Range   Anit Nuclear Antibody(ANA) NEGATIVE NEGATIVE    Comment: ANA IFA is a first line screen for detecting the presence of up to approximately 150 autoantibodies in various autoimmune diseases. A negative ANA IFA  result suggests ANA-associated autoimmune diseases are not present at this time. . Visit Physician FAQs for interpretation of all antibodies in the [place], prevalence, and association with diseases at http://education.[facility].com/ faDXA/[email or web address]   CBC with Differential     Status: Abnormal   Collection Time: 02/23/17  1:34 PM  Result Value Ref Range   WBC 9.3 3.6 - 11.0 K/uL   RBC 3.84 3.80 - 5.20 MIL/uL   Hemoglobin 11.9 (L) 12.0 - 16.0 g/dL   HCT 35.8 35.0 - 47.0 %   MCV 93.2 80.0 - 100.0 fL   MCH 31.0 26.0 - 34.0 pg   MCHC 33.2 32.0 - 36.0 g/dL   RDW 14.1 11.5 - 14.5 %   Platelets 154 150 - 440 K/uL   Neutrophils Relative % 67 %   Neutro Abs 6.2 1.4 - 6.5 K/uL   Lymphocytes Relative 19 %   Lymphs Abs 1.8 1.0 - 3.6 K/uL   Monocytes Relative 9 %   Monocytes Absolute 0.8 0.2 - 0.9 K/uL   Eosinophils Relative 4 %   Eosinophils Absolute 0.4 0 - 0.7 K/uL   Basophils Relative 1 %   Basophils Absolute 0.1 0 - 0.1 K/uL  Comprehensive metabolic panel     Status: Abnormal   Collection Time: 02/23/17  1:34 PM  Result Value Ref Range   Sodium 139 135 - 145 mmol/L   Potassium 4.6 3.5 - 5.1 mmol/L   Chloride 105 101 - 111 mmol/L   CO2 27 22 - 32 mmol/L   Glucose, Bld 99 65 - 99 mg/dL   BUN 21 (H) 6 - 20 mg/dL   Creatinine, Ser 1.70 (H) 0.44 - 1.00 mg/dL   Calcium 8.0 (L) 8.9 - 10.3 mg/dL   Total Protein 6.4 (L) 6.5 - 8.1 g/dL   Albumin 3.1 (L) 3.5 - 5.0 g/dL   AST 17 15 - 41 U/L   ALT 11 (L) 14 - 54 U/L   Alkaline Phosphatase 70 38 - 126 U/L  Total Bilirubin 0.6 0.3 - 1.2 mg/dL   GFR calc non Af Amer 27 (L) >60 mL/min   GFR calc Af Amer 32 (L) >60 mL/min    Comment: (NOTE) The eGFR has been calculated using the CKD EPI equation. This calculation has not been validated in all clinical situations. eGFR's persistently <60 mL/min signify possible Chronic Kidney Disease.    Anion gap 7 5 - 15  Ferritin     Status: None   Collection Time: 02/23/17  1:34 PM  Result  Value Ref Range   Ferritin 81 11 - 307 ng/mL     Assessment & Plan  1. 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 (HCC) Stable on chronic opioid treatment, compliant with controlled substances agreement, explained that after my departure from the practice, she will need to be referred to pain clinic for continued opioid management and she verbalized agreement - HYDROcodone-acetaminophen (Bruni) 7.5-325 MG tablet; Take 1 tablet by mouth 3 (three) times daily as needed.  Dispense: 90 tablet; Refill: 0 - Ambulatory referral to Pain Clinic  2. Gastroesophageal reflux disease, esophagitis presence not specified Stable on Zantac taken every night, she also takes pantoprazole during the day, refills provided - ranitidine (ZANTAC) 150 MG capsule; Take 1 capsule (150 mg total) by mouth at bedtime.  Dispense: 90 capsule; Refill: 0   Rika Daughdrill [facility] 03/15/2017 11:32 AM

## 2017-03-15 NOTE — Progress Notes (Signed)
The following letter was provided to Amy [name] during their visit today: ---------------------------------------  Dear valued [facility] Patient,  I am writing to share that as of July 08, 2017, I will no longer be seeing patients at [facility]. While it has been my privilege to care for you as a physician, I have decided to move outside of [place] to pursue other opportunities.  The staff at [facility] has been supportive of my decision and are supportive of any patients who have been under my care.  They will be happy to provide care to you and your family.  The office staff will do everything they can to ensure a seamless transition of care at [facility].  However if you are on any controlled substance medications (i.e. Pain medication or benzodiazepines), they will no longer be able to refill those medications, but we will be more than happy to refer you to a specialist.  [facility] center will also assist you with the transfer of medical records should you wish to seek care elsewhere.  If you have any questions about your future care, you may call the office at [phone].  I have enjoyed getting to know my patients here and I wish you the very best.  Sincerely,  Rochel Brome, MD  ---------------------------------------  A written copy of this letter was given to the patient.  The patient verbalizes understanding of the letter, and does request referral to specialist or to new primary care provider.  This decision has been conveyed to Dr. Manuella Ghazi, who will place any appropriate referrals during today's visit.

## 2017-03-23 DIAGNOSIS — N183 Chronic kidney disease, stage 3 (moderate): Secondary | ICD-10-CM | POA: Diagnosis not present

## 2017-03-23 DIAGNOSIS — D631 Anemia in chronic kidney disease: Secondary | ICD-10-CM | POA: Diagnosis not present

## 2017-03-23 DIAGNOSIS — I129 Hypertensive chronic kidney disease with stage 1 through stage 4 chronic kidney disease, or unspecified chronic kidney disease: Secondary | ICD-10-CM | POA: Diagnosis not present

## 2017-03-23 DIAGNOSIS — N2581 Secondary hyperparathyroidism of renal origin: Secondary | ICD-10-CM | POA: Diagnosis not present

## 2017-03-23 DIAGNOSIS — R809 Proteinuria, unspecified: Secondary | ICD-10-CM | POA: Diagnosis not present

## 2017-03-27 ENCOUNTER — Encounter: Payer: Self-pay | Admitting: Hematology and Oncology

## 2017-03-31 ENCOUNTER — Other Ambulatory Visit: Payer: Self-pay | Admitting: Family Medicine

## 2017-03-31 DIAGNOSIS — I739 Peripheral vascular disease, unspecified: Secondary | ICD-10-CM

## 2017-03-31 DIAGNOSIS — E7800 Pure hypercholesterolemia, unspecified: Secondary | ICD-10-CM

## 2017-03-31 DIAGNOSIS — K219 Gastro-esophageal reflux disease without esophagitis: Secondary | ICD-10-CM

## 2017-03-31 NOTE — Telephone Encounter (Signed)
Plavix requested; Dr. Manuella Ghazi approved a 90 day supply of this on 01/25/17; patient should not be out Pantoprazole requested; Dr. Manuella Ghazi approved a 6 month supply of this on 11/15/16; patient should not be out Simvastatin requested; Dr. Manuella Ghazi approved a 90 day supply on 01/25/17; patient should not be out

## 2017-04-05 ENCOUNTER — Other Ambulatory Visit: Payer: Self-pay

## 2017-04-05 ENCOUNTER — Encounter: Payer: Self-pay | Admitting: Student in an Organized Health Care Education/Training Program

## 2017-04-05 ENCOUNTER — Ambulatory Visit
Payer: Medicare HMO | Attending: Student in an Organized Health Care Education/Training Program | Admitting: Student in an Organized Health Care Education/Training Program

## 2017-04-05 VITALS — BP 101/50 | HR 55 | Temp 98.0°F | Resp 18 | Ht 62.0 in | Wt 160.0 lb

## 2017-04-05 DIAGNOSIS — M545 Low back pain: Secondary | ICD-10-CM | POA: Diagnosis not present

## 2017-04-05 DIAGNOSIS — M79605 Pain in left leg: Secondary | ICD-10-CM | POA: Diagnosis not present

## 2017-04-05 DIAGNOSIS — G546 Phantom limb syndrome with pain: Secondary | ICD-10-CM | POA: Diagnosis not present

## 2017-04-05 DIAGNOSIS — D126 Benign neoplasm of colon, unspecified: Secondary | ICD-10-CM | POA: Insufficient documentation

## 2017-04-05 DIAGNOSIS — Z23 Encounter for immunization: Secondary | ICD-10-CM | POA: Insufficient documentation

## 2017-04-05 DIAGNOSIS — R197 Diarrhea, unspecified: Secondary | ICD-10-CM | POA: Insufficient documentation

## 2017-04-05 DIAGNOSIS — Z89612 Acquired absence of left leg above knee: Secondary | ICD-10-CM | POA: Insufficient documentation

## 2017-04-05 DIAGNOSIS — F0281 Dementia in other diseases classified elsewhere with behavioral disturbance: Secondary | ICD-10-CM | POA: Diagnosis not present

## 2017-04-05 DIAGNOSIS — F411 Generalized anxiety disorder: Secondary | ICD-10-CM | POA: Insufficient documentation

## 2017-04-05 DIAGNOSIS — J449 Chronic obstructive pulmonary disease, unspecified: Secondary | ICD-10-CM | POA: Insufficient documentation

## 2017-04-05 DIAGNOSIS — Z9049 Acquired absence of other specified parts of digestive tract: Secondary | ICD-10-CM | POA: Insufficient documentation

## 2017-04-05 DIAGNOSIS — D509 Iron deficiency anemia, unspecified: Secondary | ICD-10-CM | POA: Diagnosis not present

## 2017-04-05 DIAGNOSIS — M069 Rheumatoid arthritis, unspecified: Secondary | ICD-10-CM | POA: Insufficient documentation

## 2017-04-05 DIAGNOSIS — N184 Chronic kidney disease, stage 4 (severe): Secondary | ICD-10-CM | POA: Insufficient documentation

## 2017-04-05 DIAGNOSIS — Z833 Family history of diabetes mellitus: Secondary | ICD-10-CM | POA: Insufficient documentation

## 2017-04-05 DIAGNOSIS — D649 Anemia, unspecified: Secondary | ICD-10-CM | POA: Diagnosis not present

## 2017-04-05 DIAGNOSIS — Z87440 Personal history of urinary (tract) infections: Secondary | ICD-10-CM | POA: Insufficient documentation

## 2017-04-05 DIAGNOSIS — I129 Hypertensive chronic kidney disease with stage 1 through stage 4 chronic kidney disease, or unspecified chronic kidney disease: Secondary | ICD-10-CM | POA: Insufficient documentation

## 2017-04-05 DIAGNOSIS — E1122 Type 2 diabetes mellitus with diabetic chronic kidney disease: Secondary | ICD-10-CM | POA: Diagnosis not present

## 2017-04-05 DIAGNOSIS — E669 Obesity, unspecified: Secondary | ICD-10-CM | POA: Diagnosis not present

## 2017-04-05 DIAGNOSIS — Z85528 Personal history of other malignant neoplasm of kidney: Secondary | ICD-10-CM | POA: Insufficient documentation

## 2017-04-05 DIAGNOSIS — K219 Gastro-esophageal reflux disease without esophagitis: Secondary | ICD-10-CM | POA: Insufficient documentation

## 2017-04-05 DIAGNOSIS — Z809 Family history of malignant neoplasm, unspecified: Secondary | ICD-10-CM | POA: Insufficient documentation

## 2017-04-05 DIAGNOSIS — R634 Abnormal weight loss: Secondary | ICD-10-CM | POA: Diagnosis not present

## 2017-04-05 DIAGNOSIS — E785 Hyperlipidemia, unspecified: Secondary | ICD-10-CM | POA: Diagnosis not present

## 2017-04-05 DIAGNOSIS — F1120 Opioid dependence, uncomplicated: Secondary | ICD-10-CM | POA: Diagnosis not present

## 2017-04-05 DIAGNOSIS — Z993 Dependence on wheelchair: Secondary | ICD-10-CM | POA: Insufficient documentation

## 2017-04-05 DIAGNOSIS — E1151 Type 2 diabetes mellitus with diabetic peripheral angiopathy without gangrene: Secondary | ICD-10-CM | POA: Insufficient documentation

## 2017-04-05 DIAGNOSIS — I700 Atherosclerosis of aorta: Secondary | ICD-10-CM | POA: Insufficient documentation

## 2017-04-05 DIAGNOSIS — R32 Unspecified urinary incontinence: Secondary | ICD-10-CM | POA: Diagnosis not present

## 2017-04-05 DIAGNOSIS — Z905 Acquired absence of kidney: Secondary | ICD-10-CM | POA: Diagnosis not present

## 2017-04-05 DIAGNOSIS — Z0000 Encounter for general adult medical examination without abnormal findings: Secondary | ICD-10-CM | POA: Insufficient documentation

## 2017-04-05 DIAGNOSIS — G894 Chronic pain syndrome: Secondary | ICD-10-CM | POA: Diagnosis not present

## 2017-04-05 DIAGNOSIS — K760 Fatty (change of) liver, not elsewhere classified: Secondary | ICD-10-CM | POA: Insufficient documentation

## 2017-04-05 DIAGNOSIS — F329 Major depressive disorder, single episode, unspecified: Secondary | ICD-10-CM | POA: Insufficient documentation

## 2017-04-05 DIAGNOSIS — Z87891 Personal history of nicotine dependence: Secondary | ICD-10-CM | POA: Insufficient documentation

## 2017-04-05 DIAGNOSIS — M810 Age-related osteoporosis without current pathological fracture: Secondary | ICD-10-CM | POA: Insufficient documentation

## 2017-04-05 DIAGNOSIS — Z880 Allergy status to penicillin: Secondary | ICD-10-CM | POA: Insufficient documentation

## 2017-04-05 DIAGNOSIS — Z79899 Other long term (current) drug therapy: Secondary | ICD-10-CM | POA: Insufficient documentation

## 2017-04-05 DIAGNOSIS — G301 Alzheimer's disease with late onset: Secondary | ICD-10-CM | POA: Diagnosis not present

## 2017-04-05 DIAGNOSIS — Z7902 Long term (current) use of antithrombotics/antiplatelets: Secondary | ICD-10-CM | POA: Insufficient documentation

## 2017-04-05 DIAGNOSIS — Z6829 Body mass index (BMI) 29.0-29.9, adult: Secondary | ICD-10-CM | POA: Insufficient documentation

## 2017-04-05 NOTE — Assessment & Plan Note (Signed)
General Recommendations: The pain condition that the patient suffers from is best treated with a multidisciplinary approach that involves an increase in physical activity to prevent de-conditioning and worsening of the pain cycle, as well as psychological counseling (formal and/or informal) to address the co-morbid psychological affects of pain. Treatment will often involve judicious use of pain medications and interventional procedures to decrease the pain, allowing the patient to participate in the physical activity that will ultimately produce long-lasting pain reductions. The goal of the multidisciplinary approach is to return the patient to a higher level of overall function and to restore their ability to perform activities of daily living.   

## 2017-04-05 NOTE — Progress Notes (Signed)
Safety precautions to be maintained throughout the outpatient stay will include: orient to surroundings, keep bed in low position, maintain call bell within reach at all times, provide assistance with transfer out of bed and ambulation.  

## 2017-04-05 NOTE — Progress Notes (Signed)
Patient's Name: Amy [name]  MRN: [id number]  Referring Provider: Roselee Nova, MD  DOB: Jul 21, 1935  PCP: Roselee Nova, MD  DOS: 04/05/2017  Note by: Gillis Santa, MD  Service setting: Ambulatory outpatient  Specialty: Interventional Pain Management  Location: [facility] (AMB) Pain Management Facility  Visit type: Initial Patient Evaluation  Patient type: New Patient   Primary Reason(s) for Visit: Encounter for initial evaluation of one or more chronic problems (new to examiner) potentially causing chronic pain, and posing a threat to normal musculoskeletal function. (Level of risk: High) CC: Back Pain (low) and Leg Pain (left)  HPI  Amy [name] is a 81 y.o. year old, female patient, who comes today to see [place] for the first time for an initial evaluation of her chronic pain. She has Phantom limb syndrome with pain ([place]); Anxiety; Elevated serum creatinine; Diabetes mellitus type 2, controlled ([place]); Acid reflux; Anxiety, generalized; AB (asthmatic bronchitis); Aortic atherosclerosis ([place]); Cataract; Leg pain; Continuous opioid dependence (HCC); CKD (chronic kidney disease) stage 4, GFR 15-29 ml/min (HCC); COPD, mild ([place]); Encounter for general adult medical examination without abnormal findings; Excessive falling; Fatty infiltration of liver; H/O adenomatous polyp of colon; Gravida 1; BP (high blood pressure); Hyperlipidemia; Flu vaccine need; Anterior knee pain; LBP (low back pain); Malaise and fatigue; OP (osteoporosis); Parity 1; Abnormal presence of protein in urine; PVD (peripheral vascular disease) ([place]); Allergic rhinitis; At risk for falling; Urgency of micturation; Renal mass; Abnormal EKG; Pre-operative cardiovascular examination; Renal neoplasm; Need for immunization against influenza; S/p nephrectomy; 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 ([place]); Sore throat; Wheelchair dependent; S/P AKA (above knee amputation) unilateral, left ([place]); Dementia with behavioral disturbance;  Anemia; Weight loss; Iron deficiency anemia; Depression; Diarrhea in adult patient; and Chronic pain syndrome on their problem list. Today she comes in for evaluation of her Back Pain (low) and Leg Pain (left)  Pain Assessment: Location: Lower Back(left leg) Radiating: denies Onset: More than a month ago Duration: Chronic pain Quality: Sharp Severity: 9 /10 (self-reported pain score)  Note: Reported level is inconsistent with clinical observations. Clinically the patient looks like a 3/10 A 3/10 is viewed as "Moderate" and described as significantly interfering with activities of daily living (ADL). It becomes difficult to feed, bathe, get dressed, get on and off the toilet or to perform personal hygiene functions. Difficult to get in and out of bed or a chair without assistance. Very distracting. With effort, it can be ignored when deeply involved in activities.       When using our objective Pain Scale, levels between 6 and 10/10 are said to belong in an emergency room, as it progressively worsens from a 6/10, described as severely limiting, requiring emergency care not usually available at an outpatient pain management facility. At a 6/10 level, communication becomes difficult and requires great effort. Assistance to reach the emergency department may be required. Facial flushing and profuse sweating along with potentially dangerous increases in heart rate and blood pressure will be evident. Effect on ADL:   Timing: Intermittent Modifying factors: denies  Onset and Duration: Present longer than 3 months Cause of pain: Unknown Severity: NAS-11 at its worse: 9/10, NAS-11 at its best: 5/10 and NAS-11 now: 0/10 Timing: Afternoon Aggravating Factors: none Alleviating Factors: Medications Associated Problems: Depression, Inability to control bladder (urine), Inability to control bowel, Numbness, Swelling and Temperature changes Quality of Pain: Burning and Sharp Previous Examinations or Tests: CT  scan and X-rays Previous Treatments: The patient denies none  The patient comes into the clinics today for the first time for a chronic pain management evaluation.   81 year old female who presents with left lower extremity phantom limb pain status post left above-the-knee amputation.  Patient also endorses numbness and tingling in right hand.  She has been seeing Dr. Brigitte Pulse for her pain management.  She is status post robotic nephrectomy on the left in 2016. Current medications include BuSpar 10 mg twice daily, Celexa 20 mg nightly, Plavix 75 mg, gabapentin 300 mg 3 times daily, hydrocodone 7.5 mg 3 times daily as needed, Seroquel 50 mg.  Patient does have advanced dementia with dysarthria so HPI obtained primarily from patient's husband.   Historic Controlled Substance Pharmacotherapy Review  PMP and historical list of controlled substances: Hydrocodone 7.5 mg 3 times daily as needed, quantity 90, last filled 03/15/2017 MME/day: 22.5 mg/day Medications: The patient did not bring the medication(s) to the appointment, as requested in our "New Patient Package" Pharmacodynamics: Desired effects: Analgesia: The patient reports >50% benefit. Reported improvement in function: The patient reports medication allows her to accomplish basic ADLs. Clinically meaningful improvement in function (CMIF): Sustained CMIF goals met Perceived effectiveness: Described as relatively effective, allowing for increase in activities of daily living (ADL) Undesirable effects: Side-effects or Adverse reactions: None reported Historical Monitoring: The patient  reports that she does not use drugs. List of all UDS Test(s): Lab Results  Component Value Date   MDMA NEGATIVE 10/04/2012   COCAINSCRNUR NEGATIVE 10/04/2012   PCPSCRNUR NEGATIVE 10/04/2012   THCU NEGATIVE 10/04/2012   List of other Serum/Urine Drug Screening Test(s):  Lab Results  Component Value Date   COCAINSCRNUR NEGATIVE 10/04/2012   THCU NEGATIVE  10/04/2012   Historical Background Evaluation: [place] PMP: Six (6) year initial data search conducted.             [place] Department of public safety, offender search: Editor, commissioning Information) Non-contributory Risk Assessment Profile: Aberrant behavior: None observed or detected today Risk factors for fatal opioid overdose: None identified today Fatal overdose hazard ratio (HR): Calculation deferred Non-fatal overdose hazard ratio (HR): Calculation deferred Risk of opioid abuse or dependence: 0.7-3.0% with doses ? 36 MME/day and 6.1-26% with doses ? 120 MME/day. Substance use disorder (SUD) risk level: Low Opioid risk tool (ORT) (Total Score): 0 Opioid Risk Tool - 04/05/17 1127      Family History of Substance Abuse   Alcohol  Negative    Illegal Drugs  Negative    Rx Drugs  Negative      Personal History of Substance Abuse   Alcohol  Negative    Illegal Drugs  Negative    Rx Drugs  Negative      Age   Age between 83-45 years   No      History of Preadolescent Sexual Abuse   History of Preadolescent Sexual Abuse  Negative or Female      Psychological Disease   Psychological Disease  Negative    Depression  Negative      Total Score   Opioid Risk Tool Scoring  0    Opioid Risk Interpretation  Low Risk      ORT Scoring interpretation table:  Score <3 = Low Risk for SUD  Score between 4-7 = Moderate Risk for SUD  Score >8 = High Risk for Opioid Abuse   PHQ-2 Depression Scale:  Total score: 0  PHQ-2 Scoring interpretation table: (Score and probability of major depressive disorder)  Score 0 = No depression  Score 1 =  15.4% Probability  Score 2 = 21.1% Probability  Score 3 = 38.4% Probability  Score 4 = 45.5% Probability  Score 5 = 56.4% Probability  Score 6 = 78.6% Probability   PHQ-9 Depression Scale:  Total score: 0  PHQ-9 Scoring interpretation table:  Score 0-4 = No depression  Score 5-9 = Mild depression  Score 10-14 = Moderate depression  Score 15-19 = Moderately  severe depression  Score 20-27 = Severe depression (2.4 times higher risk of SUD and 2.89 times higher risk of overuse)   Pharmacologic Plan: Pending ordered tests and/or consults            Initial impression: Pending review of available data and ordered tests.  Meds   Current Outpatient Medications:  .  acetaminophen (TYLENOL) 500 MG tablet, Take 500 mg by mouth every 6 (six) hours as needed. Pt takes 2 tablets as needed., Disp: , Rfl:  .  busPIRone (BUSPAR) 10 MG tablet, Take 10 mg by mouth 2 (two) times daily., Disp: , Rfl:  .  citalopram (CELEXA) 20 MG tablet, Take 1 tablet (20 mg total) by mouth at bedtime., Disp: 90 tablet, Rfl: 0 .  clopidogrel (PLAVIX) 75 MG tablet, TAKE 1 TABLET EVERY DAY, Disp: 90 tablet, Rfl: 0 .  docusate sodium (COLACE) 100 MG capsule, Take 100 mg by mouth daily. Pt taking once a day with breakfast, Disp: , Rfl:  .  gabapentin (NEURONTIN) 300 MG capsule, Take 1 capsule (300 mg total) by mouth 3 (three) times daily., Disp: 270 capsule, Rfl: 0 .  glucose blood test strip, Use as instructed., Disp: 100 each, Rfl: 12 .  HYDROcodone-acetaminophen (NORCO) 7.5-325 MG tablet, Take 1 tablet by mouth 3 (three) times daily as needed., Disp: 90 tablet, Rfl: 0 .  metoprolol tartrate (LOPRESSOR) 50 MG tablet, Take 1 tablet (50 mg total) by mouth 2 (two) times daily., Disp: 180 tablet, Rfl: 0 .  pantoprazole (PROTONIX) 40 MG tablet, Take 1 tablet (40 mg total) by mouth daily., Disp: 90 tablet, Rfl: 1 .  QUEtiapine (SEROQUEL) 50 MG tablet, Take 50 mg by mouth., Disp: , Rfl:  .  ranitidine (ZANTAC) 150 MG capsule, Take 1 capsule (150 mg total) by mouth at bedtime., Disp: 90 capsule, Rfl: 0 .  simvastatin (ZOCOR) 40 MG tablet, TAKE 1 TABLET EVERY DAY, Disp: 90 tablet, Rfl: 0 .  donepezil (ARICEPT) 5 MG tablet, Take 5 mg by mouth at bedtime., Disp: , Rfl:  .  ferrous sulfate 325 (65 FE) MG tablet, Take 1 tablet (325 mg total) by mouth 2 (two) times daily with a meal. (Patient not  taking: Reported on 02/23/2017), Disp: 180 tablet, Rfl: 0 .  Multiple Vitamins-Minerals (HAIR SKIN AND NAILS FORMULA) TABS, Take by mouth. One tablet daily, Disp: , Rfl:   Imaging Review   Results for orders placed during the hospital encounter of 01/27/17  DG Shoulder Right   Narrative CLINICAL DATA:  Right shoulder pain for 1 month without known injury.  EXAM: RIGHT SHOULDER - 2+ VIEW  COMPARISON:  None.  FINDINGS: There is no evidence of fracture or dislocation. There is no evidence of arthropathy or other focal bone abnormality. Soft tissues are unremarkable.  IMPRESSION: Normal right shoulder.   Electronically Signed   By: Marijo Conception, M.D.   On: 01/27/2017 16:17    Shoulder-L DG:  Results for orders placed during the hospital encounter of 01/27/17  DG Shoulder Left   Narrative CLINICAL DATA:  Left shoulder pain for 1  month without known injury.  EXAM: LEFT SHOULDER - 2+ VIEW  COMPARISON:  None.  FINDINGS: There is no evidence of fracture or dislocation. There is no evidence of arthropathy or other focal bone abnormality. Soft tissues are unremarkable.  IMPRESSION: Normal left shoulder.   Electronically Signed   By: Marijo Conception, M.D.   On: 01/27/2017 16:16     Lumbar DG (Complete) 4+V:  Results for orders placed during the hospital encounter of 01/27/17  DG Lumbar Spine Complete   Narrative CLINICAL DATA:  Pain for 2 months  EXAM: LUMBAR SPINE - COMPLETE 4+ VIEW  COMPARISON:  CT abdomen and pelvis with bony reformats August 16, 2016  FINDINGS: Frontal, lateral, spot lumbosacral lateral, and bilateral oblique views were obtained. There are 5 non-rib-bearing lumbar type vertebral bodies. There is rather marked anterior wedging of the T12 vertebral body, also present on prior CT in stable. There is retropulsion of bone along the superior aspect of T12 into the spinal canal, unchanged from prior CT. There is no new fracture.  No spondylolisthesis. There is no appreciable lumbar disc space narrowing. There is facet osteoarthritic change at L3-4, L4-5, and L5-S1 bilaterally.  There is aortic atherosclerosis. There is iliac artery atherosclerosis with stents in each common and external iliac artery. Stent extends into the common femoral artery on the right.  IMPRESSION: Stable marked anterior wedging of the T12 vertebral body with a degree of retropulsion of bone into the canal along the superior aspect of T12. Prior CT shows mild spinal stenosis in this area due to the retropulsion.  No new fracture. No spondylolisthesis. There is facet osteoarthritic change at several levels.  Aortoiliac atherosclerosis. Stents in each common and external iliac artery as well as in the right common femoral artery.  Aortic Atherosclerosis (ICD10-I70.0).   Electronically Signed   By: Lowella Grip III M.D.   On: 01/27/2017 16:17       Complexity Note: Imaging results reviewed. Results shared with Amy [name], using Layman's terms.                         ROS  Cardiovascular History: Blood thinners:  Antiplatelet Pulmonary or Respiratory History: Lung problems and Shortness of breath Neurological History: No reported neurological signs or symptoms such as seizures, abnormal skin sensations, urinary and/or fecal incontinence, being born with an abnormal open spine and/or a tethered spinal cord Review of Past Neurological Studies:  Results for orders placed or performed during the hospital encounter of 06/22/16  MR BRAIN WO CONTRAST   Narrative   CLINICAL DATA:  Memory loss.  EXAM: MRI HEAD WITHOUT CONTRAST  TECHNIQUE: Multiplanar, multiecho pulse sequences of the brain and surrounding structures were obtained without intravenous contrast.  COMPARISON:  Head CT 08/15/2014  FINDINGS: Brain: No reversible cause of memory loss identified. No mass, hydrocephalus, or extra-axial collection. No abnormal  cortical or thalamic diffusion abnormality.  There is generalized cortical atrophy that is moderate for age. Volume loss is greater on the right, which may be related to more extensive chronic ischemic injury. Chronic small-vessel disease with confluent ischemic gliosis in the deep/periventricular white matter and remote perforator infarcts affecting the bilateral basal ganglia, more extensive on the right. No notable mesial temporal volume loss. No generalized chronic hemorrhagic injury.  No notable change compared to 2016.  Vascular: Preserved flow voids  Skull and upper cervical spine: No marrow lesion noted. Facet arthropathy.  Sinuses/Orbits: Bilateral cataract resection  IMPRESSION: 1.  No specific or reversible explanation for memory loss. 2. Moderate atrophy that is generalized. 3. Moderate chronic microvascular disease. Remote perforator infarcts in the bilateral basal ganglia.   Electronically Signed   By: Monte Fantasia M.D.   On: 06/22/2016 11:16    Psychological-Psychiatric History: Anxiousness and Depressed Gastrointestinal History: Reflux or heatburn and Alternating episodes iof diarrhea and constipation (IBS-Irritable bowe syndrome) Genitourinary History: Kidney disease, Peeing blood and Recurrent Urinary Tract infections Hematological History: Brusing easily Endocrine History: No reported endocrine signs or symptoms such as high or low blood sugar, rapid heart rate due to high thyroid levels, obesity or weight gain due to slow thyroid or thyroid disease Rheumatologic History: Rheumatoid arthritis Musculoskeletal History: Negative for myasthenia gravis, muscular dystrophy, multiple sclerosis or malignant hyperthermia Work History: Retired  Allergies  Amy [name] is allergic to fentanyl and penicillins.  Laboratory Chemistry  Inflammation Markers (CRP: Acute Phase) (ESR: Chronic Phase) Lab Results  Component Value Date   ESRSEDRATE 19 01/27/2017                  Renal Function Markers Lab Results  Component Value Date   BUN 21 (H) 02/23/2017   CREATININE 1.70 (H) 02/23/2017   GFRAA 32 (L) 02/23/2017   GFRNONAA 27 (L) 02/23/2017                 Hepatic Function Markers Lab Results  Component Value Date   AST 17 02/23/2017   ALT 11 (L) 02/23/2017   ALBUMIN 3.1 (L) 02/23/2017   ALKPHOS 70 02/23/2017                 Electrolytes Lab Results  Component Value Date   NA 139 02/23/2017   K 4.6 02/23/2017   CL 105 02/23/2017   CALCIUM 8.0 (L) 02/23/2017   MG 1.2 (L) 10/05/2012                 Neuropathy Markers Lab Results  Component Value Date   VITAMINB12 456 03/31/2016                 Bone Pathology Markers Lab Results  Component Value Date   ALKPHOS 70 02/23/2017   CALCIUM 8.0 (L) 02/23/2017                 Rheumatology Markers No results found for: [facility]              Coagulation Parameters Lab Results  Component Value Date   PLT 154 02/23/2017                 Cardiovascular Markers Lab Results  Component Value Date   BNP 195 (H) 08/15/2014   TROPONINI < 0.03 08/15/2014   HGB 11.9 (L) 02/23/2017   HCT 35.8 02/23/2017                 CA Markers No results found for: CEA, CA125, LABCA2               Note: Lab results reviewed.  [place]  Drug: Amy [name]  reports that she does not use drugs. Alcohol:  reports that she does not drink alcohol. Tobacco:  reports that she quit smoking about 12 years ago. Her smoking use included cigarettes. She has a 76.50 pack-year smoking history. she has never used smokeless tobacco. Medical:  has a past medical history of Anxiety, Bilateral cataracts, Bronchitis, Cancer of kidney, left ([place]) (2017), Cataract, Chronic kidney disease, Depression, Diabetes mellitus without complication ([place]),  Dry cough, DVT (deep venous thrombosis) (HCC), GERD (gastroesophageal reflux disease), History of frequent urinary tract infections, Hyperlipidemia, Hypertension, Left renal mass,  Numbness and tingling, Obesity, Peripheral artery disease ([place]), Urinary frequency, and Urinary incontinence. Family: family history includes Cancer in her brother, father, and mother; Diabetes in her brother.  Past Surgical History:  Procedure Laterality Date  . ABDOMINAL HYSTERECTOMY  1978  . CARPAL TUNNEL RELEASE Bilateral   . CHOLECYSTECTOMY    . LEG AMPUTATION     above the knee / left   . stent placement right leg      Active Ambulatory Problems    Diagnosis Date Noted  . Phantom limb syndrome with pain ([place]) 11/05/2014  . Anxiety 11/05/2014  . Elevated serum creatinine 11/05/2014  . Diabetes mellitus type 2, controlled ([place]) 11/05/2014  . Acid reflux 11/20/2014  . Anxiety, generalized 11/20/2014  . AB (asthmatic bronchitis) 11/20/2014  . Aortic atherosclerosis ([place]) 11/20/2014  . Cataract 11/20/2014  . Leg pain 11/20/2014  . Continuous opioid dependence ([place]) 11/20/2014  . CKD (chronic kidney disease) stage 4, GFR 15-29 ml/min (HCC) 11/20/2014  . COPD, mild ([place]) 11/20/2014  . Encounter for general adult medical examination without abnormal findings 11/20/2014  . Excessive falling 11/20/2014  . Fatty infiltration of liver 11/20/2014  . H/O adenomatous polyp of colon 07/27/2010  . Gravida 1 11/20/2014  . BP (high blood pressure) 11/20/2014  . Hyperlipidemia 11/20/2014  . Flu vaccine need 11/20/2014  . Anterior knee pain 11/20/2014  . LBP (low back pain) 11/20/2014  . Malaise and fatigue 11/20/2014  . OP (osteoporosis) 11/20/2014  . Parity 1 11/20/2014  . Abnormal presence of protein in urine 11/20/2014  . PVD (peripheral vascular disease) ([place]) 11/20/2014  . Allergic rhinitis 11/20/2014  . At risk for falling 11/20/2014  . Urgency of micturation 11/20/2014  . Renal mass 12/05/2014  . Abnormal EKG 01/02/2015  . Pre-operative cardiovascular examination 01/07/2015  . Renal neoplasm 01/15/2015  . Need for immunization against influenza 01/29/2015  . S/p nephrectomy  01/29/2015  . 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 ([place]) 03/13/2015  . Sore throat 05/20/2015  . Wheelchair dependent 03/11/2016  . S/P AKA (above knee amputation) unilateral, left ([place]) 06/08/2016  . Dementia with behavioral disturbance 06/15/2016  . Anemia 06/29/2016  . Weight loss 07/17/2016  . Iron deficiency anemia 09/08/2016  . Depression 11/15/2016  . Diarrhea in adult patient 12/15/2016  . Chronic pain syndrome 04/05/2017   Resolved Ambulatory Problems    Diagnosis Date Noted  . Acute urinary tract infection 11/20/2014  . Acute vulvovaginitis 11/20/2014  . Abnormal mental state 11/20/2014  . Congestion of respiratory tract 11/20/2014  . Brash 11/20/2014  . Difficult or painful urination 11/20/2014  . Candida glabrata infection 11/20/2014  . Kidney lump 11/20/2014  . Low blood pressure reading 11/20/2014  . FLS (phantom limb syndrome) 11/20/2014  . Need for vaccination 11/20/2014  . Groin pain 11/20/2014  . Screening for gout 11/20/2014  . Change in blood platelet count 11/20/2014  . Lower urinary tract infection 11/20/2014  . Avitaminosis D 11/20/2014  . Preoperative clearance 01/02/2015   Past Medical History:  Diagnosis Date  . Anxiety   . Bilateral cataracts   . Bronchitis   . Cancer of kidney, left ([place]) 2017  . Cataract   . Chronic kidney disease   . Depression   . Diabetes mellitus without complication ([place])   . Dry cough   . DVT (deep venous thrombosis) ([place])   .  GERD (gastroesophageal reflux disease)   . History of frequent urinary tract infections   . Hyperlipidemia   . Hypertension   . Left renal mass   . Numbness and tingling   . Obesity   . Peripheral artery disease ([place])   . Urinary frequency   . Urinary incontinence    Constitutional Exam  General appearance: alert and in mild distress Vitals:   04/05/17 1119  BP: (!) 101/50  Pulse: (!) 55  Resp: 18  Temp: 98 F (36.7 C)  SpO2: 94%  Weight: 160 lb (72.6 kg)  Height:  5' 2"  (1.575 m)   BMI Assessment: Estimated body mass index is 29.26 kg/m as calculated from the following:   Height as of this encounter: 5' 2"  (1.575 m).   Weight as of this encounter: 160 lb (72.6 kg).  BMI interpretation table: BMI level Category Range association with higher incidence of chronic pain  <18 kg/m2 Underweight   18.5-24.9 kg/m2 Ideal body weight   25-29.9 kg/m2 Overweight Increased incidence by 20%  30-34.9 kg/m2 Obese (Class I) Increased incidence by 68%  35-39.9 kg/m2 Severe obesity (Class II) Increased incidence by 136%  >40 kg/m2 Extreme obesity (Class III) Increased incidence by 254%   BMI Readings from Last 4 Encounters:  04/05/17 29.26 kg/m  09/13/16 27.12 kg/m  09/08/16 27.46 kg/m  07/14/16 26.57 kg/m   Wt Readings from Last 4 Encounters:  04/05/17 160 lb (72.6 kg)  09/13/16 153 lb 1.6 oz (69.4 kg)  09/08/16 155 lb (70.3 kg)  07/14/16 150 lb (68 kg)  Psych/Mental status: Alert, oriented x 3 (person, place, & time)       Eyes: PERLA Respiratory: No evidence of acute respiratory distress  Cervical Spine Area Exam  Skin & Axial Inspection: No masses, redness, edema, swelling, or associated skin lesions Alignment: Symmetrical Functional ROM: Unrestricted ROM      Stability: No instability detected Muscle Tone/Strength: Functionally intact. No obvious neuro-muscular anomalies detected. Sensory (Neurological): Unimpaired Palpation: No palpable anomalies              Upper Extremity (UE) Exam    Side: Right upper extremity  Side: Left upper extremity  Skin & Extremity Inspection: Skin color, temperature, and hair growth are WNL. No peripheral edema or cyanosis. No masses, redness, swelling, asymmetry, or associated skin lesions. No contractures.  Skin & Extremity Inspection: Skin color, temperature, and hair growth are WNL. No peripheral edema or cyanosis. No masses, redness, swelling, asymmetry, or associated skin lesions. No contractures.   Functional ROM: Unrestricted ROM          Functional ROM: Unrestricted ROM          Muscle Tone/Strength: Functionally intact. No obvious neuro-muscular anomalies detected.  Muscle Tone/Strength: Functionally intact. No obvious neuro-muscular anomalies detected.  Sensory (Neurological): Unimpaired          Sensory (Neurological): Unimpaired          Palpation: No palpable anomalies              Palpation: No palpable anomalies              Specialized Test(s): Deferred         Specialized Test(s): Deferred          Thoracic Spine Area Exam  Skin & Axial Inspection: No masses, redness, or swelling Alignment: Symmetrical Functional ROM: Unrestricted ROM Stability: No instability detected Muscle Tone/Strength: Functionally intact. No obvious neuro-muscular anomalies detected. Sensory (Neurological): Unimpaired Muscle strength & Tone:  No palpable anomalies  Lumbar Spine Area Exam  Skin & Axial Inspection: No masses, redness, or swelling Alignment: Symmetrical Functional ROM: Unrestricted ROM      Stability: No instability detected Muscle Tone/Strength: Functionally intact. No obvious neuro-muscular anomalies detected. Sensory (Neurological): Unimpaired Palpation: No palpable anomalies       Provocative Tests: Lumbar Hyperextension and rotation test: evaluation deferred today       Lumbar Lateral bending test: evaluation deferred today       Patrick's Maneuver: evaluation deferred today                    Gait & Posture Assessment  Patient in wheelchair.  Significant kyphosis.  Lower Extremity Exam    Side: Right lower extremity  Side: Left lower extremity  Skin & Extremity Inspection: Skin color, temperature, and hair growth are WNL. No peripheral edema or cyanosis. No masses, redness, swelling, asymmetry, or associated skin lesions. No contractures.  Skin & Extremity Inspection: Above knee amputation (AKA)  Functional ROM: Unrestricted ROM          Functional ROM: Diminished ROM           Muscle Tone/Strength: Functionally intact. No obvious neuro-muscular anomalies detected.  Muscle Tone/Strength: Functionally intact. No obvious neuro-muscular anomalies detected.  Sensory (Neurological): Unimpaired  Sensory (Neurological): Paresthesia (Tingling sensation)  Palpation: No palpable anomalies  Palpation: No palpable anomalies   Assessment  Primary Diagnosis & Pertinent Problem List: The primary encounter diagnosis was Phantom limb syndrome with pain ([place]). Diagnoses of Chronic pain syndrome, CKD (chronic kidney disease) stage 4, GFR 15-29 ml/min (HCC), Late onset Alzheimer's disease with behavioral disturbance, Continuous opioid dependence ([place]), S/P AKA (above knee amputation) unilateral, left ([place]), and S/p nephrectomy were also pertinent to this visit.  Visit Diagnosis (New problems to examiner): 1. Phantom limb syndrome with pain ([place])   2. Chronic pain syndrome   3. CKD (chronic kidney disease) stage 4, GFR 15-29 ml/min (HCC)   4. Late onset Alzheimer's disease with behavioral disturbance   5. Continuous opioid dependence ([place])   6. S/P AKA (above knee amputation) unilateral, left ([place])   7. S/p nephrectomy    Plan of Care (Initial workup plan)  Note: Please be advised that as per protocol, today's visit has been an evaluation only. We have not taken over the patient's controlled substance management.  General Recommendations: The pain condition that the patient suffers from is best treated with a multidisciplinary approach that involves an increase in physical activity to prevent de-conditioning and worsening of the pain cycle, as well as psychological counseling (formal and/or informal) to address the co-morbid psychological affects of pain. Treatment will often involve judicious use of pain medications and interventional procedures to decrease the pain, allowing the patient to participate in the physical activity that will ultimately produce long-lasting pain reductions.  The goal of the multidisciplinary approach is to return the patient to a higher level of overall function and to restore their ability to perform activities of daily living.  Problem-specific plan: Phantom limb syndrome with pain Discussed neuropathic therapies with patient.  Patient on suitable multimodal pain regimen which includes gabapentin 300 mg 3 times daily and hydrocodone 7.5 mg 3 times daily as needed.  Will obtain urine drug screen and if appropriate take over patient's opioid prescribing at next visit.  Also discussed mirror therapy for phantom limb pain which the patient and her husband want to research more.  Chronic pain syndrome General Recommendations: The pain condition that  the patient suffers from is best treated with a multidisciplinary approach that involves an increase in physical activity to prevent de-conditioning and worsening of the pain cycle, as well as psychological counseling (formal and/or informal) to address the co-morbid psychological affects of pain. Treatment will often involve judicious use of pain medications and interventional procedures to decrease the pain, allowing the patient to participate in the physical activity that will ultimately produce long-lasting pain reductions. The goal of the multidisciplinary approach is to return the patient to a higher level of overall function and to restore their ability to perform activities of daily living.  Low back pain: X-ray reviewed.  Shows lumbar facet arthropathy.  Discussed lumbar facet blocks but patient would need to be off of Plavix for 7 days prior which carries inherent risk of stroke, clot formation.  Patient does not prefer injections.   Ordered Lab-work, Procedure(s), Referral(s), & Consult(s): Orders Placed This Encounter  Procedures  . Compliance Drug Analysis, Ur  . Drug Screen 10 W/Conf, Serum   Interventional management options: Ms. Kasparek was informed that there is no guarantee that she would  be a candidate for interventional therapies. The decision will be based on the results of diagnostic studies, as well as Ms. Feltner's risk profile.  Procedure(s) under consideration:  -Diagnostic bilateral lumbar facet blocks if patient able to come off Plavix for 7 days.   Provider-requested follow-up: Return in about 2 weeks (around 04/19/2017) for Medication Management.  Future Appointments  Date Time Provider [place]  04/26/2017 12:00 PM Gillis Santa, MD [facility] None  06/15/2017 11:40 AM Roselee Nova, MD [place] [facility]  06/24/2017 10:30 AM [facility] 1 [facility] None  06/24/2017 11:30 AM Algernon Huxley, MD [facility] None  08/16/2017 10:00 AM [facility]  08/24/2017  1:30 PM [facility] None  08/24/2017  1:45 PM Lequita Asal, MD [facility] None    Primary Care Physician: Roselee Nova, MD Location: [facility] Note by: Gillis Santa, M.D, Date: 04/05/2017; Time: 2:19 PM  There are no Patient Instructions on file for this visit.

## 2017-04-05 NOTE — Assessment & Plan Note (Addendum)
Discussed neuropathic therapies with patient.  Patient on suitable multimodal pain regimen which includes gabapentin 300 mg 3 times daily and hydrocodone 7.5 mg 3 times daily as needed.  Will obtain urine drug screen and if appropriate take over patient's opioid prescribing at next visit.  Also discussed mirror therapy for phantom limb pain which the patient and her husband want to research more.

## 2017-04-13 ENCOUNTER — Other Ambulatory Visit: Payer: Self-pay | Admitting: Family Medicine

## 2017-04-13 DIAGNOSIS — G546 Phantom limb syndrome with pain: Secondary | ICD-10-CM

## 2017-04-13 MED ORDER — HYDROCODONE-ACETAMINOPHEN 7.5-325 MG PO TABS: 1.0000 | ORAL_TABLET | Freq: Three times a day (TID) | ORAL | 0 refills | Status: DC | PRN

## 2017-04-13 NOTE — Telephone Encounter (Signed)
Copied from [place] [phone]. Topic: General - Other >> Apr 13, 2017  7:50 AM Yvette Rack wrote: Reason for CRM: patient need a refill on Hydrocodone  >> Apr 13, 2017  7:51 AM Yvette Rack wrote: Med refill on hydrocodone

## 2017-04-13 NOTE — Telephone Encounter (Addendum)
Pt was seen at the pain clinic on 04/05/2017. Reviewed Dr. Holley Raring notes and it looks like he will discuss possible taking over her opioid meds at pr next visit which is 04/26/2017. Spoke to the pt husband in the office and he states Dr. Holley Raring is asking them to speak to you about following up on her pain meds. She has enough to last her through the weekend.

## 2017-04-17 LAB — DRUG SCREEN 10 W/CONF, SERUM
Amphetamines, IA: NEGATIVE ng/mL
BARBITURATES, IA: NEGATIVE ug/mL
Benzodiazepines, IA: NEGATIVE ng/mL
Cocaine & Metabolite, IA: NEGATIVE ng/mL
METHADONE, IA: NEGATIVE ng/mL
Opiates, IA: POSITIVE ng/mL — AB
Oxycodones, IA: NEGATIVE ng/mL
PHENCYCLIDINE, IA: NEGATIVE ng/mL
Propoxyphene, IA: NEGATIVE ng/mL
THC(MARIJUANA) METABOLITE, IA: NEGATIVE ng/mL

## 2017-04-17 LAB — OPIATES,MS,WB/SP RFX
6-Acetylmorphine: NEGATIVE
CODEINE: NEGATIVE ng/mL
Dihydrocodeine: 12 ng/mL
Hydrocodone: 44.6 ng/mL
Hydromorphone: NEGATIVE ng/mL
Morphine: NEGATIVE ng/mL
OPIATE CONFIRMATION: POSITIVE

## 2017-04-17 LAB — OXYCODONES,MS,WB/SP RFX
Oxycocone: NEGATIVE ng/mL
Oxycodones Confirmation: NEGATIVE
Oxymorphone: NEGATIVE ng/mL

## 2017-04-18 NOTE — Telephone Encounter (Signed)
RX was printed on 04/13/2017 and given to miel to put up front

## 2017-04-19 DIAGNOSIS — Z8659 Personal history of other mental and behavioral disorders: Secondary | ICD-10-CM | POA: Diagnosis not present

## 2017-04-19 DIAGNOSIS — G309 Alzheimer's disease, unspecified: Secondary | ICD-10-CM | POA: Diagnosis not present

## 2017-04-19 DIAGNOSIS — F0151 Vascular dementia with behavioral disturbance: Secondary | ICD-10-CM | POA: Diagnosis not present

## 2017-04-19 DIAGNOSIS — F411 Generalized anxiety disorder: Secondary | ICD-10-CM | POA: Diagnosis not present

## 2017-04-19 DIAGNOSIS — G546 Phantom limb syndrome with pain: Secondary | ICD-10-CM | POA: Diagnosis not present

## 2017-04-19 DIAGNOSIS — F0281 Dementia in other diseases classified elsewhere with behavioral disturbance: Secondary | ICD-10-CM | POA: Diagnosis not present

## 2017-04-19 DIAGNOSIS — F5104 Psychophysiologic insomnia: Secondary | ICD-10-CM | POA: Diagnosis not present

## 2017-04-22 ENCOUNTER — Other Ambulatory Visit: Payer: Self-pay | Admitting: Family Medicine

## 2017-04-22 DIAGNOSIS — K219 Gastro-esophageal reflux disease without esophagitis: Secondary | ICD-10-CM

## 2017-04-22 MED ORDER — PANTOPRAZOLE SODIUM 40 MG PO TBEC: 40.0000 mg | DELAYED_RELEASE_TABLET | Freq: Every day | ORAL | 1 refills | Status: DC

## 2017-04-22 NOTE — Telephone Encounter (Signed)
Copied from [place]. Topic: Quick Communication - See Telephone Encounter >> Apr 22, 2017 11:37 AM Conception Chancy, NT wrote: CRM for notification. See Telephone encounter for:  04/22/17.  Pt husband states the pt needs rx refill sent into [facility]

## 2017-04-26 ENCOUNTER — Encounter: Payer: Self-pay | Admitting: Student in an Organized Health Care Education/Training Program

## 2017-04-26 ENCOUNTER — Ambulatory Visit
Payer: Medicare HMO | Attending: Student in an Organized Health Care Education/Training Program | Admitting: Student in an Organized Health Care Education/Training Program

## 2017-04-26 VITALS — BP 146/75 | HR 56 | Temp 97.8°F | Resp 16 | Ht 62.0 in

## 2017-04-26 DIAGNOSIS — G301 Alzheimer's disease with late onset: Secondary | ICD-10-CM | POA: Diagnosis not present

## 2017-04-26 DIAGNOSIS — F419 Anxiety disorder, unspecified: Secondary | ICD-10-CM | POA: Insufficient documentation

## 2017-04-26 DIAGNOSIS — Z5181 Encounter for therapeutic drug level monitoring: Secondary | ICD-10-CM | POA: Diagnosis not present

## 2017-04-26 DIAGNOSIS — G546 Phantom limb syndrome with pain: Secondary | ICD-10-CM | POA: Insufficient documentation

## 2017-04-26 DIAGNOSIS — E785 Hyperlipidemia, unspecified: Secondary | ICD-10-CM | POA: Diagnosis not present

## 2017-04-26 DIAGNOSIS — Z89612 Acquired absence of left leg above knee: Secondary | ICD-10-CM | POA: Diagnosis not present

## 2017-04-26 DIAGNOSIS — E669 Obesity, unspecified: Secondary | ICD-10-CM | POA: Insufficient documentation

## 2017-04-26 DIAGNOSIS — R32 Unspecified urinary incontinence: Secondary | ICD-10-CM | POA: Insufficient documentation

## 2017-04-26 DIAGNOSIS — F0281 Dementia in other diseases classified elsewhere with behavioral disturbance: Secondary | ICD-10-CM | POA: Insufficient documentation

## 2017-04-26 DIAGNOSIS — G894 Chronic pain syndrome: Secondary | ICD-10-CM

## 2017-04-26 DIAGNOSIS — M25521 Pain in right elbow: Secondary | ICD-10-CM | POA: Insufficient documentation

## 2017-04-26 DIAGNOSIS — M545 Low back pain: Secondary | ICD-10-CM | POA: Insufficient documentation

## 2017-04-26 DIAGNOSIS — E1122 Type 2 diabetes mellitus with diabetic chronic kidney disease: Secondary | ICD-10-CM | POA: Diagnosis not present

## 2017-04-26 DIAGNOSIS — E1136 Type 2 diabetes mellitus with diabetic cataract: Secondary | ICD-10-CM | POA: Insufficient documentation

## 2017-04-26 DIAGNOSIS — K219 Gastro-esophageal reflux disease without esophagitis: Secondary | ICD-10-CM | POA: Insufficient documentation

## 2017-04-26 DIAGNOSIS — I129 Hypertensive chronic kidney disease with stage 1 through stage 4 chronic kidney disease, or unspecified chronic kidney disease: Secondary | ICD-10-CM | POA: Insufficient documentation

## 2017-04-26 DIAGNOSIS — Z87891 Personal history of nicotine dependence: Secondary | ICD-10-CM | POA: Insufficient documentation

## 2017-04-26 DIAGNOSIS — M79605 Pain in left leg: Secondary | ICD-10-CM | POA: Insufficient documentation

## 2017-04-26 DIAGNOSIS — N184 Chronic kidney disease, stage 4 (severe): Secondary | ICD-10-CM | POA: Insufficient documentation

## 2017-04-26 DIAGNOSIS — M25552 Pain in left hip: Secondary | ICD-10-CM | POA: Insufficient documentation

## 2017-04-26 DIAGNOSIS — Z6829 Body mass index (BMI) 29.0-29.9, adult: Secondary | ICD-10-CM | POA: Insufficient documentation

## 2017-04-26 DIAGNOSIS — M25551 Pain in right hip: Secondary | ICD-10-CM | POA: Insufficient documentation

## 2017-04-26 DIAGNOSIS — R200 Anesthesia of skin: Secondary | ICD-10-CM | POA: Insufficient documentation

## 2017-04-26 DIAGNOSIS — E1151 Type 2 diabetes mellitus with diabetic peripheral angiopathy without gangrene: Secondary | ICD-10-CM | POA: Diagnosis not present

## 2017-04-26 DIAGNOSIS — Z86718 Personal history of other venous thrombosis and embolism: Secondary | ICD-10-CM | POA: Insufficient documentation

## 2017-04-26 DIAGNOSIS — Z79899 Other long term (current) drug therapy: Secondary | ICD-10-CM | POA: Diagnosis not present

## 2017-04-26 DIAGNOSIS — F329 Major depressive disorder, single episode, unspecified: Secondary | ICD-10-CM | POA: Diagnosis not present

## 2017-04-26 DIAGNOSIS — Z880 Allergy status to penicillin: Secondary | ICD-10-CM | POA: Insufficient documentation

## 2017-04-26 DIAGNOSIS — F02818 Dementia in other diseases classified elsewhere, unspecified severity, with other behavioral disturbance: Secondary | ICD-10-CM

## 2017-04-26 DIAGNOSIS — Z7902 Long term (current) use of antithrombotics/antiplatelets: Secondary | ICD-10-CM | POA: Insufficient documentation

## 2017-04-26 MED ORDER — HYDROCODONE-ACETAMINOPHEN 7.5-325 MG PO TABS: 1.0000 | ORAL_TABLET | Freq: Three times a day (TID) | ORAL | 0 refills | Status: DC | PRN

## 2017-04-26 MED ORDER — GABAPENTIN 300 MG PO CAPS: 300.0000 mg | ORAL_CAPSULE | Freq: Three times a day (TID) | ORAL | 3 refills | Status: DC

## 2017-04-26 NOTE — Patient Instructions (Signed)
1. Continue Gabapentin and Hydrocodone at its current dose. Follow up in 2 months 2. Sign opioid agreement

## 2017-04-26 NOTE — Progress Notes (Signed)
Patient's Name: Amy [name]  MRN: [id number]  Referring Provider: Roselee Nova, MD  DOB: 1935-09-30  PCP: Roselee Nova, MD  DOS: 04/26/2017  Note by: Gillis Santa, MD  Service setting: Ambulatory outpatient  Specialty: Interventional Pain Management  Location: [facility] (AMB) Pain Management Facility    Patient type: Established   Primary Reason(s) for Visit: Encounter for prescription drug management. (Level of risk: moderate)  CC: Elbow Pain (right); Hip Pain (right); Back Pain (lower bilateral); and Leg Pain (phantom pain in left nub)  HPI  Amy [name] is a 81 y.o. year old, female patient, who comes today for a medication management evaluation. She has Phantom limb syndrome with pain ([place]); Anxiety; Elevated serum creatinine; Diabetes mellitus type 2, controlled ([place]); Acid reflux; Anxiety, generalized; AB (asthmatic bronchitis); Aortic atherosclerosis ([place]); Cataract; Leg pain; Continuous opioid dependence (HCC); CKD (chronic kidney disease) stage 4, GFR 15-29 ml/min (HCC); COPD, mild ([place]); Encounter for general adult medical examination without abnormal findings; Excessive falling; Fatty infiltration of liver; H/O adenomatous polyp of colon; Gravida 1; BP (high blood pressure); Hyperlipidemia; Flu vaccine need; Anterior knee pain; LBP (low back pain); Malaise and fatigue; OP (osteoporosis); Parity 1; Abnormal presence of protein in urine; PVD (peripheral vascular disease) ([place]); Allergic rhinitis; At risk for falling; Urgency of micturation; Renal mass; Abnormal EKG; Pre-operative cardiovascular examination; Renal neoplasm; Need for immunization against influenza; S/p nephrectomy; 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 ([place]); Sore throat; Wheelchair dependent; S/P AKA (above knee amputation) unilateral, left ([place]); Dementia with behavioral disturbance; Anemia; Weight loss; Iron deficiency anemia; Depression; Diarrhea in adult patient; and Chronic pain syndrome on their problem  list. Her primarily concern today is the Elbow Pain (right); Hip Pain (right); Back Pain (lower bilateral); and Leg Pain (phantom pain in left nub)  Pain Assessment: Location: Lower, Left, Right Back(elbow and hip pain.  also phantom pain in left leg) Radiating: NA Onset: More than a month ago Duration: Chronic pain Quality: Discomfort Severity: 8 /10 (self-reported pain score)  Note: Reported level is inconsistent with clinical observations. Clinically the patient looks like a 3/10 A 3/10 is viewed as "Moderate" and described as significantly interfering with activities of daily living (ADL). It becomes difficult to feed, bathe, get dressed, get on and off the toilet or to perform personal hygiene functions. Difficult to get in and out of bed or a chair without assistance. Very distracting. With effort, it can be ignored when deeply involved in activities.       When using our objective Pain Scale, levels between 6 and 10/10 are said to belong in an emergency room, as it progressively worsens from a 6/10, described as severely limiting, requiring emergency care not usually available at an outpatient pain management facility. At a 6/10 level, communication becomes difficult and requires great effort. Assistance to reach the emergency department may be required. Facial flushing and profuse sweating along with potentially dangerous increases in heart rate and blood pressure will be evident. Effect on ADL: patient states that she mainly hurts when she is lying in bed  Timing:   Modifying factors: medicine  Amy [name] was last scheduled for an appointment on 04/05/2017 for medication management. During today's appointment we reviewed Amy [name] chronic pain status, as well as her outpatient medication regimen.  81 year old female who presents with left lower extremity phantom limb pain status post left above-the-knee amputation.  Patient also endorses numbness and tingling in right hand.  She has  been seeing Dr.  Brigitte Pulse for her pain management.  She is status post robotic nephrectomy on the left in 2016. Current medications include BuSpar 10 mg 3 times daily, Celexa 20 mg nightly, Plavix 75 mg, gabapentin 300 mg 3 times daily, hydrocodone 7.5 mg 3 times daily as needed, Seroquel 50 mg.  Patient does have advanced dementia with dysarthria so HPI obtained primarily from patient's husband.  Overall patient states that she is doing fairly well at baseline.  Her psychiatrist has discontinued her Xanax which has resulted in difficulty sleeping.  Overall her husband thinks that this is better for her given her dementia symptoms which I agree with.  Today we will take over her medication regimen.  The patient  reports that she does not use drugs. Her body mass index is 29.26 kg/m.  Further details on both, my assessment(s), as well as the proposed treatment plan, please see below.  Controlled Substance Pharmacotherapy Assessment REMS (Risk Evaluation and Mitigation Strategy)  Analgesic: Hydrocodone 7.5 mg 3 times daily as needed MME/day: Approximately 24 mg/day.  Janett Billow, RN  04/26/2017 12:14 PM  Sign at close encounter Safety precautions to be maintained throughout the outpatient stay will include: orient to surroundings, keep bed in low position, maintain call bell within reach at all times, provide assistance with transfer out of bed and ambulation.    Pharmacokinetics: Liberation and absorption (onset of action): WNL Distribution (time to peak effect): WNL Metabolism and excretion (duration of action): WNL         Pharmacodynamics: Desired effects: Analgesia: Ms. Anes reports >50% benefit. Functional ability: Patient reports that medication allows her to accomplish basic ADLs Clinically meaningful improvement in function (CMIF): Sustained CMIF goals met Perceived effectiveness: Described as relatively effective, allowing for increase in activities of daily living  (ADL) Undesirable effects: Side-effects or Adverse reactions: None reported Monitoring: [place] PMP: Online review of the past 71-monthperiod conducted. Compliant with practice rules and regulations Last UDS on record: No results found for: SUMMARY patient incontinent and will try to obtain at next visit. UDS interpretation: Not applicable          Medication Assessment Form: Patient incontinent.  We will try to obtain a urine drug screen at next visit.  No compliance concerns.  Low risk for substance abuse potential. Treatment compliance: Compliant Risk Assessment Profile: Aberrant behavior: See prior evaluations. None observed or detected today Comorbid factors increasing risk of overdose: See prior notes. No additional risks detected today Risk of substance use disorder (SUD): Low Opioid Risk Tool - 04/05/17 1127      Family History of Substance Abuse   Alcohol  Negative    Illegal Drugs  Negative    Rx Drugs  Negative      Personal History of Substance Abuse   Alcohol  Negative    Illegal Drugs  Negative    Rx Drugs  Negative      Age   Age between 145-45years   No      History of Preadolescent Sexual Abuse   History of Preadolescent Sexual Abuse  Negative or Female      Psychological Disease   Psychological Disease  Negative    Depression  Negative      Total Score   Opioid Risk Tool Scoring  0    Opioid Risk Interpretation  Low Risk      ORT Scoring interpretation table:  Score <3 = Low Risk for SUD  Score between 4-7 = Moderate Risk for SUD  Score >8 = High Risk for Opioid Abuse   Risk Mitigation Strategies:  Patient Counseling: Completed today. Counseling provided to patient as per "Patient Counseling Document". Document signed by patient, attesting to counseling and understanding Patient-Prescriber Agreement (PPA): Obtained today  Notification to other healthcare providers: Done  Pharmacologic Plan: Today we will take over the chronic pain medication management  and from this point on our medication agreement with this patient is active  Laboratory Chemistry  Inflammation Markers (CRP: Acute Phase) (ESR: Chronic Phase) Lab Results  Component Value Date   ESRSEDRATE 19 01/27/2017                 Rheumatology Markers Lab Results  Component Value Date   RF <14 01/27/2017   ANA NEGATIVE 01/27/2017                Renal Function Markers Lab Results  Component Value Date   BUN 21 (H) 02/23/2017   CREATININE 1.70 (H) 02/23/2017   GFRAA 32 (L) 02/23/2017   GFRNONAA 27 (L) 02/23/2017                 Hepatic Function Markers Lab Results  Component Value Date   AST 17 02/23/2017   ALT 11 (L) 02/23/2017   ALBUMIN 3.1 (L) 02/23/2017   ALKPHOS 70 02/23/2017                 Electrolytes Lab Results  Component Value Date   NA 139 02/23/2017   K 4.6 02/23/2017   CL 105 02/23/2017   CALCIUM 8.0 (L) 02/23/2017   MG 1.2 (L) 10/05/2012                 Neuropathy Markers Lab Results  Component Value Date   VITAMINB12 456 03/31/2016   FOLATE 9.8 07/14/2016   HGBA1C 5.6 10/15/2016                 Bone Pathology Markers No results found for: VD25OH, VD125OH2TOT, WV3710GY6, RS8546EV0, 25OHVITD1, 25OHVITD2, 25OHVITD3, TESTOFREE, TESTOSTERONE               Coagulation Parameters Lab Results  Component Value Date   PLT 154 02/23/2017                 Cardiovascular Markers Lab Results  Component Value Date   BNP 195 (H) 08/15/2014   TROPONINI < 0.03 08/15/2014   HGB 11.9 (L) 02/23/2017   HCT 35.8 02/23/2017                 CA Markers No results found for: CEA, CA125, LABCA2               Note: Lab results reviewed.  Recent Diagnostic Imaging Results  CT Chest Wo Contrast CLINICAL DATA:  Low back pain for 2 months. Anemia. Stage III renal cell carcinoma.  EXAM: CT CHEST, ABDOMEN AND PELVIS WITHOUT CONTRAST  TECHNIQUE: Multidetector CT imaging of the chest, abdomen and pelvis was performed following the standard protocol  without IV contrast.  COMPARISON:  Chest radiograph 12/08/2016.  Prior CTs of 08/16/2016.  FINDINGS: CT CHEST FINDINGS  Cardiovascular: Advanced aortic and branch vessel atherosclerosis. Tortuous thoracic aorta. Mild cardiomegaly. No pericardial effusion. Multivessel coronary artery atherosclerosis.  Mediastinum/Nodes: No supraclavicular adenopathy. Right paratracheal node measures 9 mm today versus 11 mm on the prior. Hilar regions poorly evaluated without intravenous contrast.  Lungs/Pleura: No pleural fluid. Advanced bullous type emphysema. Pleural-based right upper lobe pulmonary nodule measures 7 x 5  mm on image 80/series 5. Compare 6 x 4 mm on the prior exam. 6 mm craniocaudal on sagittal image 67 today versus similar on the prior. Left lower lobe pulmonary nodule measures 3 mm on image 99/ series 5 may be minimally enlarged from 1-2 mm on the prior.  A subpleural left upper lobe pulmonary nodule is similar at 3 mm on image 50/series 5.  Musculoskeletal: Moderate to severe T12 compression deformity with mild ventral canal encroachment, similar. Minimal superior endplate irregularity at T6, chronic.  CT ABDOMEN PELVIS FINDINGS  Hepatobiliary: Normal noncontrast appearance of the liver. Cholecystectomy, without biliary ductal dilatation.  Pancreas: Normal, without mass or ductal dilatation.  Spleen: Normal in size, without focal abnormality.  Adrenals/Urinary Tract: Normal right adrenal gland. Left nephrectomy. No locally recurrent disease. Right renal cortical thinning with lower pole low-density right renal lesions which are most consistent with cysts. The larger measures 7.7 cm. Right renal vascular calcifications. Mild bladder wall thickening.  Stomach/Bowel: Normal stomach, without wall thickening. Normal colon and terminal ileum. Normal small bowel.  Vascular/Lymphatic: Advanced aortic and branch vessel atherosclerosis. Infrarenal abdominal aortic  nonaneurysmal dilatation is similar, including 2.6 cm. Bilateral iliac artery stents. No abdominopelvic adenopathy.  Reproductive: Hysterectomy.  No adnexal mass.  Other: No significant free fluid. Mild pelvic floor laxity. Ventral abdominal wall laxity is mild and contains fat.  Musculoskeletal: Osteopenia.  IMPRESSION: 1. Status post left nephrectomy, without recurrent or metastatic disease in the abdomen/pelvis. 2. A pleural-based right upper lobe pulmonary nodule is felt to be similar. Apparent slight increased in dimensions on transverse imaging is felt to be due to differences in slice selection. Recommend attention on follow-up. 3.  Emphysema (ICD10-J43.9). 4. Coronary artery atherosclerosis. Aortic Atherosclerosis (ICD10-I70.0). 5. Mild bladder wall thickening, suggesting cystitis.  Electronically Signed   By: Abigail Miyamoto M.D.   On: 02/16/2017 11:47 CT Abdomen Pelvis Wo Contrast CLINICAL DATA:  Low back pain for 2 months. Anemia. Stage III renal cell carcinoma.  EXAM: CT CHEST, ABDOMEN AND PELVIS WITHOUT CONTRAST  TECHNIQUE: Multidetector CT imaging of the chest, abdomen and pelvis was performed following the standard protocol without IV contrast.  COMPARISON:  Chest radiograph 12/08/2016.  Prior CTs of 08/16/2016.  FINDINGS: CT CHEST FINDINGS  Cardiovascular: Advanced aortic and branch vessel atherosclerosis. Tortuous thoracic aorta. Mild cardiomegaly. No pericardial effusion. Multivessel coronary artery atherosclerosis.  Mediastinum/Nodes: No supraclavicular adenopathy. Right paratracheal node measures 9 mm today versus 11 mm on the prior. Hilar regions poorly evaluated without intravenous contrast.  Lungs/Pleura: No pleural fluid. Advanced bullous type emphysema. Pleural-based right upper lobe pulmonary nodule measures 7 x 5 mm on image 80/series 5. Compare 6 x 4 mm on the prior exam. 6 mm craniocaudal on sagittal image 67 today versus similar on the  prior. Left lower lobe pulmonary nodule measures 3 mm on image 99/ series 5 may be minimally enlarged from 1-2 mm on the prior.  A subpleural left upper lobe pulmonary nodule is similar at 3 mm on image 50/series 5.  Musculoskeletal: Moderate to severe T12 compression deformity with mild ventral canal encroachment, similar. Minimal superior endplate irregularity at T6, chronic.  CT ABDOMEN PELVIS FINDINGS  Hepatobiliary: Normal noncontrast appearance of the liver. Cholecystectomy, without biliary ductal dilatation.  Pancreas: Normal, without mass or ductal dilatation.  Spleen: Normal in size, without focal abnormality.  Adrenals/Urinary Tract: Normal right adrenal gland. Left nephrectomy. No locally recurrent disease. Right renal cortical thinning with lower pole low-density right renal lesions which are most consistent with cysts.  The larger measures 7.7 cm. Right renal vascular calcifications. Mild bladder wall thickening.  Stomach/Bowel: Normal stomach, without wall thickening. Normal colon and terminal ileum. Normal small bowel.  Vascular/Lymphatic: Advanced aortic and branch vessel atherosclerosis. Infrarenal abdominal aortic nonaneurysmal dilatation is similar, including 2.6 cm. Bilateral iliac artery stents. No abdominopelvic adenopathy.  Reproductive: Hysterectomy.  No adnexal mass.  Other: No significant free fluid. Mild pelvic floor laxity. Ventral abdominal wall laxity is mild and contains fat.  Musculoskeletal: Osteopenia.  IMPRESSION: 1. Status post left nephrectomy, without recurrent or metastatic disease in the abdomen/pelvis. 2. A pleural-based right upper lobe pulmonary nodule is felt to be similar. Apparent slight increased in dimensions on transverse imaging is felt to be due to differences in slice selection. Recommend attention on follow-up. 3.  Emphysema (ICD10-J43.9). 4. Coronary artery atherosclerosis. Aortic Atherosclerosis (ICD10-I70.0). 5.  Mild bladder wall thickening, suggesting cystitis.  Electronically Signed   By: Abigail Miyamoto M.D.   On: 02/16/2017 11:47  Complexity Note: Imaging results reviewed. Results shared with Amy [name], using Layman's terms.                         Meds   Current Outpatient Medications:  .  acetaminophen (TYLENOL) 500 MG tablet, Take 500 mg by mouth every 6 (six) hours as needed. Pt takes 2 tablets as needed., Disp: , Rfl:  .  busPIRone (BUSPAR) 10 MG tablet, Take 10 mg by mouth 3 (three) times daily. , Disp: , Rfl:  .  citalopram (CELEXA) 20 MG tablet, Take 1 tablet (20 mg total) by mouth at bedtime., Disp: 90 tablet, Rfl: 0 .  clopidogrel (PLAVIX) 75 MG tablet, TAKE 1 TABLET EVERY DAY, Disp: 90 tablet, Rfl: 0 .  donepezil (ARICEPT) 5 MG tablet, Take 1 tablet by mouth at bedtime., Disp: , Rfl:  .  gabapentin (NEURONTIN) 300 MG capsule, Take 1 capsule (300 mg total) by mouth 3 (three) times daily., Disp: 270 capsule, Rfl: 3 .  glucose blood test strip, Use as instructed., Disp: 100 each, Rfl: 12 .  HYDROcodone-acetaminophen (NORCO) 7.5-325 MG tablet, Take 1 tablet by mouth 3 (three) times daily as needed., Disp: 90 tablet, Rfl: 0 .  lisinopril (PRINIVIL,ZESTRIL) 5 MG tablet, Take 1 tablet by mouth daily., Disp: , Rfl:  .  metoprolol tartrate (LOPRESSOR) 50 MG tablet, Take 1 tablet (50 mg total) by mouth 2 (two) times daily., Disp: 180 tablet, Rfl: 0 .  pantoprazole (PROTONIX) 40 MG tablet, Take 1 tablet (40 mg total) by mouth daily., Disp: 90 tablet, Rfl: 1 .  QUEtiapine (SEROQUEL) 50 MG tablet, Take 50 mg by mouth at bedtime. , Disp: , Rfl:  .  ranitidine (ZANTAC) 150 MG capsule, Take 1 capsule (150 mg total) by mouth at bedtime., Disp: 90 capsule, Rfl: 0 .  simvastatin (ZOCOR) 40 MG tablet, TAKE 1 TABLET EVERY DAY, Disp: 90 tablet, Rfl: 0 .  docusate sodium (COLACE) 100 MG capsule, Take 100 mg by mouth daily. Pt taking once a day with breakfast, Disp: , Rfl:  .  donepezil (ARICEPT) 5 MG  tablet, Take 5 mg by mouth at bedtime., Disp: , Rfl:  .  ferrous sulfate 325 (65 FE) MG tablet, Take 1 tablet (325 mg total) by mouth 2 (two) times daily with a meal. (Patient not taking: Reported on 02/23/2017), Disp: 180 tablet, Rfl: 0 .  Multiple Vitamins-Minerals (HAIR SKIN AND NAILS FORMULA) TABS, Take by mouth. One tablet daily, Disp: , Rfl:   ROS  Constitutional: Denies any fever or chills Gastrointestinal: No reported hemesis, hematochezia, vomiting, or acute GI distress Musculoskeletal: Denies any acute onset joint swelling, redness, loss of ROM, or weakness Neurological: No reported episodes of acute onset apraxia, aphasia, dysarthria, agnosia, amnesia, paralysis, loss of coordination, or loss of consciousness  Allergies  Amy [name] is allergic to fentanyl and penicillins.  [place]  Drug: Amy [name]  reports that she does not use drugs. Alcohol:  reports that she does not drink alcohol. Tobacco:  reports that she quit smoking about 12 years ago. Her smoking use included cigarettes. She has a 76.50 pack-year smoking history. she has never used smokeless tobacco. Medical:  has a past medical history of Anxiety, Bilateral cataracts, Bronchitis, Cancer of kidney, left ([place]) (2017), Cataract, Chronic kidney disease, Depression, Diabetes mellitus without complication ([place]), Dry cough, DVT (deep venous thrombosis) ([place]), GERD (gastroesophageal reflux disease), History of frequent urinary tract infections, Hyperlipidemia, Hypertension, Left renal mass, Numbness and tingling, Obesity, Peripheral artery disease ([place]), Urinary frequency, and Urinary incontinence. Surgical: Amy [name]  has a past surgical history that includes Leg amputation; Abdominal hysterectomy (1978); Cholecystectomy; Carpal tunnel release (Bilateral); stent placement right leg ; and Robot assisted laparoscopic nephrectomy (Left, 01/15/2015). Family: family history includes Cancer in her brother, father, and mother; Diabetes  in her brother.  Constitutional Exam  General appearance: Well nourished, well developed, and well hydrated. In no apparent acute distress Vitals:   04/26/17 1213  BP: (!) 146/75  Pulse: (!) 56  Resp: 16  Temp: 97.8 F (36.6 C)  TempSrc: Oral  SpO2: 95%  Height: _0  (1.575 m)   BMI Assessment: Estimated body mass index is 29.26 kg/m as calculated from the following:   Height as of this encounter: _1  (1.575 m).   Weight as of 04/05/17: 160 lb (72.6 kg).  BMI interpretation table: BMI level Category Range association with higher incidence of chronic pain  <18 kg/m2 Underweight   18.5-24.9 kg/m2 Ideal body weight   25-29.9 kg/m2 Overweight Increased incidence by 20%  30-34.9 kg/m2 Obese (Class I) Increased incidence by 68%  35-39.9 kg/m2 Severe obesity (Class II) Increased incidence by 136%  >40 kg/m2 Extreme obesity (Class III) Increased incidence by 254%   BMI Readings from Last 4 Encounters:  04/26/17 29.26 kg/m  04/05/17 29.26 kg/m  09/13/16 27.12 kg/m  09/08/16 27.46 kg/m   Wt Readings from Last 4 Encounters:  04/05/17 160 lb (72.6 kg)  09/13/16 153 lb 1.6 oz (69.4 kg)  09/08/16 155 lb (70.3 kg)  07/14/16 150 lb (68 kg)  Psych/Mental status: Alert, oriented x 3 (person, place, & time)       Eyes: PERLA Respiratory: No evidence of acute respiratory distress  Cervical Spine Area Exam  Skin & Axial Inspection: No masses, redness, edema, swelling, or associated skin lesions Alignment: Symmetrical Functional ROM: Unrestricted ROM      Stability: No instability detected Muscle Tone/Strength: Functionally intact. No obvious neuro-muscular anomalies detected. Sensory (Neurological): Unimpaired Palpation: No palpable anomalies                     Upper Extremity (UE) Exam    Side: Right upper extremity  Side: Left upper extremity   Skin & Extremity Inspection: Skin color, temperature, and hair growth are WNL. No peripheral edema or cyanosis. No masses,  redness, swelling, asymmetry, or associated skin lesions. No contractures.  Skin & Extremity Inspection: Skin color, temperature, and hair growth are WNL. No peripheral edema or cyanosis. No  masses, redness, swelling, asymmetry, or associated skin lesions. No contractures.   Functional ROM: Unrestricted ROM          Functional ROM: Unrestricted ROM           Muscle Tone/Strength: Functionally intact. No obvious neuro-muscular anomalies detected.  Muscle Tone/Strength: Functionally intact. No obvious neuro-muscular anomalies detected.   Sensory (Neurological): Unimpaired          Sensory (Neurological): Unimpaired           Palpation: No palpable anomalies              Palpation: No palpable anomalies               Specialized Test(s): Deferred         Specialized Test(s): Deferred           Thoracic Spine Area Exam  Skin & Axial Inspection: No masses, redness, or swelling Alignment: Symmetrical Functional ROM: Unrestricted ROM Stability: No instability detected Muscle Tone/Strength: Functionally intact. No obvious neuro-muscular anomalies detected. Sensory (Neurological): Unimpaired Muscle strength & Tone: No palpable anomalies  Lumbar Spine Area Exam  Skin & Axial Inspection: No masses, redness, or swelling Alignment: Symmetrical Functional ROM: Unrestricted ROM      Stability: No instability detected Muscle Tone/Strength: Functionally intact. No obvious neuro-muscular anomalies detected. Sensory (Neurological): Unimpaired Palpation: No palpable anomalies       Provocative Tests: Lumbar Hyperextension and rotation test: evaluation deferred today       Lumbar Lateral bending test: evaluation deferred today       Patrick's Maneuver: evaluation deferred today                    Gait & Posture Assessment  Patient in wheelchair.  Significant kyphosis.  Lower Extremity Exam    Side: Right lower extremity  Side: Left lower extremity  Skin & Extremity Inspection: Skin color,  temperature, and hair growth are WNL. No peripheral edema or cyanosis. No masses, redness, swelling, asymmetry, or associated skin lesions. No contractures.  Skin & Extremity Inspection: Above knee amputation (AKA)  Functional ROM: Unrestricted ROM          Functional ROM: Diminished ROM          Muscle Tone/Strength: Functionally intact. No obvious neuro-muscular anomalies detected.  Muscle Tone/Strength: Functionally intact. No obvious neuro-muscular anomalies detected.  Sensory (Neurological): Unimpaired  Sensory (Neurological): Paresthesia (Tingling sensation)  Palpation: No palpable anomalies  Palpation: No palpable anomalies    Assessment  Primary Diagnosis & Pertinent Problem List: The primary encounter diagnosis was Phantom limb syndrome with pain ([place]). Diagnoses of Chronic pain syndrome, CKD (chronic kidney disease) stage 4, GFR 15-29 ml/min (HCC), Late onset Alzheimer's disease with behavioral disturbance, S/P AKA (above knee amputation) unilateral, left (HCC), and 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 ([place]) were also pertinent to this visit.  Status Diagnosis  Persistent Persistent Persistent 1. Phantom limb syndrome with pain ([place])   2. Chronic pain syndrome   3. CKD (chronic kidney disease) stage 4, GFR 15-29 ml/min (HCC)   4. Late onset Alzheimer's disease with behavioral disturbance   5. S/P AKA (above knee amputation) unilateral, left ([place])   6. 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 ([place])      General Recommendations: The pain condition that the patient suffers from is best treated with a multidisciplinary approach that involves an increase in physical activity to prevent de-conditioning and worsening of the pain cycle, as well as psychological counseling (formal  and/or informal) to address the co-morbid psychological affects of pain. Treatment will often involve judicious use of pain medications and interventional procedures to decrease the pain,  allowing the patient to participate in the physical activity that will ultimately produce long-lasting pain reductions. The goal of the multidisciplinary approach is to return the patient to a higher level of overall function and to restore their ability to perform activities of daily living.  81 year old female with a history of left above-the-knee amputation, phantom limb syndrome, chronic kidney disease, advanced dementia secondary to Alzheimer's disease with phantom limb pain and bilateral hip pain.  Patient has been stable on her opioid regimen of hydrocodone 7.5 mg 3 times daily as needed.  She is also on gabapentin 300 mg 3 times daily.  I will take over these medications today and have her complete an opiate agreement with our clinic.  I will obtain urine at the next visit.  Patient is incontinent and difficulty with providing urine today.  She is also in a wheelchair and has significant immobility.  Plan: -Hydrocodone at its current dose, 7.5 mg 3 times daily as needed.  Prescription for 2 months -Continue gabapentin 300 mg 3 times daily. -Continue other medications as prescribed.   Plan of Care  Pharmacotherapy (Medications Ordered): Meds ordered this encounter  Medications  . DISCONTD: HYDROcodone-acetaminophen (NORCO) 7.5-325 MG tablet    Sig: Take 1 tablet by mouth 3 (three) times daily as needed.    Dispense:  90 tablet    Refill:  0    For chronic pain To be filled on or after: 05/12/17, 06/11/17  . DISCONTD: gabapentin (NEURONTIN) 300 MG capsule    Sig: Take 1 capsule (300 mg total) by mouth 3 (three) times daily.    Dispense:  270 capsule    Refill:  3  . HYDROcodone-acetaminophen (NORCO) 7.5-325 MG tablet    Sig: Take 1 tablet by mouth 3 (three) times daily as needed.    Dispense:  90 tablet    Refill:  0    For chronic pain To be filled on or after: 05/12/17, 06/11/17  . gabapentin (NEURONTIN) 300 MG capsule    Sig: Take 1 capsule (300 mg total) by mouth 3 (three)  times daily.    Dispense:  270 capsule    Refill:  3    Provider-requested follow-up: Return in about 8 weeks (around 06/21/2017) for Medication Management.  Future Appointments  Date Time Provider [place]cifer  06/15/2017 11:40 AM Roselee Nova, MD [facility]  06/21/2017 12:00 PM Gillis Santa, MD [facility] None  06/24/2017 10:30 AM [facility] None  06/24/2017 11:30 AM Algernon Huxley, MD [facility] None  08/16/2017 10:00 AM [facility]  08/24/2017  1:30 PM [facility] None  08/24/2017  1:45 PM Lequita Asal, MD [facility] None    Primary Care Physician: Roselee Nova, MD Location: [facility] Note by: Gillis Santa, M.D Date: 04/26/2017; Time: 2:00 PM  Patient Instructions  1. Continue Gabapentin and Hydrocodone at its current dose. Follow up in 2 months 2. Sign opioid agreement

## 2017-04-26 NOTE — Progress Notes (Signed)
Safety precautions to be maintained throughout the outpatient stay will include: orient to surroundings, keep bed in low position, maintain call bell within reach at all times, provide assistance with transfer out of bed and ambulation.  

## 2017-05-13 ENCOUNTER — Other Ambulatory Visit: Payer: Self-pay | Admitting: Family Medicine

## 2017-05-13 DIAGNOSIS — F3342 Major depressive disorder, recurrent, in full remission: Secondary | ICD-10-CM

## 2017-05-19 ENCOUNTER — Other Ambulatory Visit: Payer: Self-pay | Admitting: Family Medicine

## 2017-05-19 DIAGNOSIS — E7800 Pure hypercholesterolemia, unspecified: Secondary | ICD-10-CM

## 2017-05-19 DIAGNOSIS — I739 Peripheral vascular disease, unspecified: Secondary | ICD-10-CM

## 2017-06-13 ENCOUNTER — Other Ambulatory Visit: Payer: Self-pay | Admitting: Family Medicine

## 2017-06-13 DIAGNOSIS — I10 Essential (primary) hypertension: Secondary | ICD-10-CM

## 2017-06-15 ENCOUNTER — Encounter: Payer: Self-pay | Admitting: Family Medicine

## 2017-06-15 ENCOUNTER — Ambulatory Visit (INDEPENDENT_AMBULATORY_CARE_PROVIDER_SITE_OTHER): Payer: Medicare HMO | Admitting: Family Medicine

## 2017-06-15 VITALS — BP 140/72 | HR 60 | Temp 98.2°F | Resp 14 | Wt 160.0 lb

## 2017-06-15 DIAGNOSIS — N184 Chronic kidney disease, stage 4 (severe): Secondary | ICD-10-CM | POA: Diagnosis not present

## 2017-06-15 DIAGNOSIS — E7800 Pure hypercholesterolemia, unspecified: Secondary | ICD-10-CM

## 2017-06-15 DIAGNOSIS — J449 Chronic obstructive pulmonary disease, unspecified: Secondary | ICD-10-CM | POA: Diagnosis not present

## 2017-06-15 DIAGNOSIS — I10 Essential (primary) hypertension: Secondary | ICD-10-CM

## 2017-06-15 DIAGNOSIS — E1122 Type 2 diabetes mellitus with diabetic chronic kidney disease: Secondary | ICD-10-CM

## 2017-06-15 LAB — POCT GLYCOSYLATED HEMOGLOBIN (HGB A1C): HEMOGLOBIN A1C: 5.9

## 2017-06-15 MED ORDER — METOPROLOL TARTRATE 50 MG PO TABS: 50.0000 mg | ORAL_TABLET | Freq: Two times a day (BID) | ORAL | 0 refills | Status: DC

## 2017-06-15 MED ORDER — ALBUTEROL SULFATE HFA 108 (90 BASE) MCG/ACT IN AERS: 2.0000 | INHALATION_SPRAY | Freq: Four times a day (QID) | RESPIRATORY_TRACT | 2 refills | Status: DC | PRN

## 2017-06-15 MED ORDER — SIMVASTATIN 40 MG PO TABS: 40.0000 mg | ORAL_TABLET | Freq: Every day | ORAL | 0 refills | Status: DC

## 2017-06-15 NOTE — Progress Notes (Signed)
Name: Amy [name]   MRN: [id number]    DOB: 1936-01-10   Date:06/15/2017       Progress Note  Subjective  Chief Complaint  Chief Complaint  Patient presents with  . Hypertension    Pt denies any issues  . Diabetes    controlled; Pt do not take any medications. Was on meds but not anymore     Hypertension  This is a chronic problem. The problem is unchanged. The problem is controlled. Pertinent negatives include no blurred vision, chest pain, headaches, palpitations, shortness of breath or sweats. Past treatments include ACE inhibitors and beta blockers (no longer on Lisinopril, Metoprolol decreased to 1/2 tablet at night by Neurology as per pt.'s husband). Hypertensive end-organ damage includes kidney disease. There is no history of CAD/MI or CVA.  Diabetes  She presents for her follow-up diabetic visit. She has type 2 diabetes mellitus. Her disease course has been stable. Pertinent negatives for hypoglycemia include no confusion, dizziness, headaches, speech difficulty or sweats. Pertinent negatives for diabetes include no blurred vision, no chest pain, no foot paresthesias, no polydipsia and no polyuria. Pertinent negatives for diabetic complications include no CVA, heart disease or peripheral neuropathy. Current diabetic treatment includes diet. She is compliant with treatment all of the time. Her weight is stable. She is following a generally healthy diet. She rarely (not able to exercise.) participates in exercise. She monitors blood glucose at home 1-2 x per week. Her breakfast blood glucose range is generally 70-90 mg/dl. An ACE inhibitor/angiotensin II receptor blocker is not being taken.  Hyperlipidemia  This is a chronic problem. The problem is uncontrolled. Recent lipid tests were reviewed and are high (elevated Triglycerides). Exacerbating diseases include obesity. Pertinent negatives include no chest pain, leg pain, myalgias or shortness of breath. Current antihyperlipidemic  treatment includes statins. The current treatment provides significant improvement of lipids.     Past Medical History:  Diagnosis Date  . Anxiety   . Bilateral cataracts   . Bronchitis    hx of   . Cancer of kidney, left ([place]) 2017  . Cataract   . Chronic kidney disease   . Depression   . Diabetes mellitus without complication ([place])   . Dry cough   . 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 GERD (gastroesophageal reflux disease)   . History of frequent urinary tract infections   . Hyperlipidemia   . Hypertension   . Left renal mass   . Numbness and tingling    right hand   . Obesity   . Peripheral artery disease ([place])   . Urinary frequency   . Urinary incontinence     Past Surgical History:  Procedure Laterality Date  . ABDOMINAL HYSTERECTOMY  1978  . CARPAL TUNNEL RELEASE Bilateral   . CHOLECYSTECTOMY    . LEG AMPUTATION     above the knee / left   . ROBOT ASSISTED LAPAROSCOPIC NEPHRECTOMY Left 01/15/2015   Procedure: ROBOTIC ASSISTED LAPAROSCOPIC RADICAL NEPHRECTOMY;  Surgeon: Alexis Frock, MD;  Location: [facility];  Service: Urology;  Laterality: Left;  . stent placement right leg       Family History  Problem Relation Age of Onset  . Cancer Mother        Stomach  . Cancer Father   . Diabetes Brother   . Cancer Brother     Social History   Socioeconomic History  . Marital status: Married  Spouse name: Not on file  . Number of children: Not on file  . Years of education: Not on file  . Highest education level: Not on file  Social Needs  . Financial resource strain: Not on file  . Food insecurity - worry: Not on file  . Food insecurity - inability: Not on file  . Transportation needs - medical: Not on file  . Transportation needs - non-medical: Not on file  Occupational History  . Not on file  Tobacco Use  . Smoking status: Former Smoker    Packs/day: 1.50    Years: 51.00    Pack years: 76.50     Types: Cigarettes    Last attempt to quit: 05/24/2004    Years since quitting: 13.0  . Smokeless tobacco: Never Used  Substance and Sexual Activity  . Alcohol use: No  . Drug use: No  . Sexual activity: Not Currently  Other Topics Concern  . Not on file  Social History Narrative  . Not on file     Current Outpatient Medications:  .  acetaminophen (TYLENOL) 500 MG tablet, Take 500 mg by mouth every 6 (six) hours as needed. Pt takes 2 tablets as needed., Disp: , Rfl:  .  citalopram (CELEXA) 20 MG tablet, TAKE 1 TABLET (20 MG TOTAL) BY MOUTH AT BEDTIME., Disp: 90 tablet, Rfl: 0 .  clopidogrel (PLAVIX) 75 MG tablet, TAKE 1 TABLET EVERY DAY, Disp: 90 tablet, Rfl: 0 .  docusate sodium (COLACE) 100 MG capsule, Take 100 mg by mouth daily. Pt taking once a day with breakfast, Disp: , Rfl:  .  donepezil (ARICEPT) 5 MG tablet, Take 1 tablet by mouth at bedtime., Disp: , Rfl:  .  ferrous sulfate 325 (65 FE) MG tablet, Take 1 tablet (325 mg total) by mouth 2 (two) times daily with a meal., Disp: 180 tablet, Rfl: 0 .  gabapentin (NEURONTIN) 300 MG capsule, Take 1 capsule (300 mg total) by mouth 3 (three) times daily., Disp: 270 capsule, Rfl: 3 .  glucose blood test strip, Use as instructed., Disp: 100 each, Rfl: 12 .  HYDROcodone-acetaminophen (NORCO) 7.5-325 MG tablet, Take 1 tablet by mouth 3 (three) times daily as needed., Disp: 90 tablet, Rfl: 0 .  lisinopril (PRINIVIL,ZESTRIL) 5 MG tablet, Take 1 tablet by mouth daily., Disp: , Rfl:  .  metoprolol tartrate (LOPRESSOR) 50 MG tablet, Take 1 tablet (50 mg total) by mouth 2 (two) times daily., Disp: 180 tablet, Rfl: 0 .  Multiple Vitamins-Minerals (HAIR SKIN AND NAILS FORMULA) TABS, Take by mouth. One tablet daily, Disp: , Rfl:  .  pantoprazole (PROTONIX) 40 MG tablet, Take 1 tablet (40 mg total) by mouth daily., Disp: 90 tablet, Rfl: 1 .  QUEtiapine (SEROQUEL) 50 MG tablet, Take 50 mg by mouth at bedtime. , Disp: , Rfl:  .  ranitidine (ZANTAC) 150 MG  capsule, Take 1 capsule (150 mg total) by mouth at bedtime., Disp: 90 capsule, Rfl: 0 .  simvastatin (ZOCOR) 40 MG tablet, TAKE 1 TABLET EVERY DAY, Disp: 90 tablet, Rfl: 0 .  donepezil (ARICEPT) 5 MG tablet, Take 5 mg by mouth at bedtime., Disp: , Rfl:   Allergies  Allergen Reactions  . Fentanyl Other (See Comments)    urine retention  . Penicillins Itching and Rash     Review of Systems  Eyes: Negative for blurred vision.  Respiratory: Negative for shortness of breath.   Cardiovascular: Negative for chest pain and palpitations.  Musculoskeletal: Negative for myalgias.  Neurological: Negative for dizziness, speech difficulty and headaches.  Endo/Heme/Allergies: Negative for polydipsia.  Psychiatric/Behavioral: Negative for confusion.      Objective  Vitals:   06/15/17 1427  BP: 140/72  Pulse: 60  Resp: 14  Temp: 98.2 F (36.8 C)  TempSrc: Oral  SpO2: 95%  Weight: 160 lb (72.6 kg)    Physical Exam  Constitutional: She is well-developed, well-nourished, and in no distress.  HENT:  Head: Normocephalic and atraumatic.  Cardiovascular: Normal rate, regular rhythm and normal heart sounds.  No murmur heard. Pulmonary/Chest: Effort normal. She has decreased breath sounds. She has wheezes in the right lower field, the left middle field and the left lower field. She has no rhonchi.  Abdominal: Soft. Bowel sounds are normal. There is no tenderness.  Psychiatric: Mood, affect and judgment normal. Her mood appears not anxious.  Nursing note and vitals reviewed.     Assessment & Plan  1. Controlled type 2 diabetes mellitus with stage 4 chronic kidney disease, without long-term current use of insulin (HCC) A1c of 5.9%, well-controlled diabetes - POCT HgB A1C  2. COPD, mild ([place]) Refill for albuterol provided, educated on proper use, patient has quit smoking but did smoke for many years previously. - albuterol (PROVENTIL HFA;VENTOLIN HFA) 108 (90 Base) MCG/ACT inhaler;  Inhale 2 puffs into the lungs every 6 (six) hours as needed for wheezing or shortness of breath.  Dispense: 1 Inhaler; Refill: 2  3. Essential hypertension Be stable on present anti-hypertensive treatment - metoprolol tartrate (LOPRESSOR) 50 MG tablet; Take 1 tablet (50 mg total) by mouth 2 (two) times daily.  Dispense: 180 tablet; Refill: 0  4. Pure hypercholesterolemia  - simvastatin (ZOCOR) 40 MG tablet; Take 1 tablet (40 mg total) by mouth daily.  Dispense: 90 tablet; Refill: 0 - Lipid panel - COMPLETE METABOLIC PANEL WITH GFR   Manu Rubey Asad A. [facility] 06/15/2017 2:55 PM

## 2017-06-16 LAB — LIPID PANEL
Cholesterol: 149 mg/dL (ref <200)
HDL: 41 mg/dL — ABNORMAL LOW (ref >50)
LDL Cholesterol (Calc): 75 mg/dL (calc)
NON-HDL CHOLESTEROL (CALC): 108 mg/dL (ref <130)
Total CHOL/HDL Ratio: 3.6 (calc) (ref <5.0)
Triglycerides: 235 mg/dL — ABNORMAL HIGH (ref <150)

## 2017-06-16 LAB — COMPLETE METABOLIC PANEL WITH GFR
AG RATIO: 1.2 (calc) (ref 1.0–2.5)
ALBUMIN MSPROF: 3.3 g/dL — AB (ref 3.6–5.1)
ALT: 5 U/L — AB (ref 6–29)
AST: 11 U/L (ref 10–35)
Alkaline phosphatase (APISO): 77 U/L (ref 33–130)
BUN/Creatinine Ratio: 7 (calc) (ref 6–22)
BUN: 11 mg/dL (ref 7–25)
CALCIUM: 8.5 mg/dL — AB (ref 8.6–10.4)
CO2: 30 mmol/L (ref 20–32)
Chloride: 104 mmol/L (ref 98–110)
Creat: 1.54 mg/dL — ABNORMAL HIGH (ref 0.60–0.93)
GFR, EST AFRICAN AMERICAN: 37 mL/min/{1.73_m2} — AB (ref >=60)
GFR, EST NON AFRICAN AMERICAN: 32 mL/min/{1.73_m2} — AB (ref >=60)
Globulin: 2.7 g/dL (calc) (ref 1.9–3.7)
Glucose, Bld: 87 mg/dL (ref 65–99)
Potassium: 4.8 mmol/L (ref 3.5–5.3)
Sodium: 140 mmol/L (ref 135–146)
TOTAL PROTEIN: 6 g/dL — AB (ref 6.1–8.1)
Total Bilirubin: 0.5 mg/dL (ref 0.2–1.2)

## 2017-06-21 ENCOUNTER — Other Ambulatory Visit: Payer: Self-pay | Admitting: Family Medicine

## 2017-06-21 ENCOUNTER — Encounter: Payer: Self-pay | Admitting: Student in an Organized Health Care Education/Training Program

## 2017-06-21 ENCOUNTER — Other Ambulatory Visit: Payer: Self-pay

## 2017-06-21 ENCOUNTER — Ambulatory Visit
Payer: Medicare HMO | Attending: Student in an Organized Health Care Education/Training Program | Admitting: Student in an Organized Health Care Education/Training Program

## 2017-06-21 VITALS — BP 134/84 | HR 59 | Temp 97.8°F | Resp 16 | Ht 63.0 in

## 2017-06-21 DIAGNOSIS — Z760 Encounter for issue of repeat prescription: Secondary | ICD-10-CM | POA: Diagnosis not present

## 2017-06-21 DIAGNOSIS — E1122 Type 2 diabetes mellitus with diabetic chronic kidney disease: Secondary | ICD-10-CM | POA: Insufficient documentation

## 2017-06-21 DIAGNOSIS — R809 Proteinuria, unspecified: Secondary | ICD-10-CM | POA: Insufficient documentation

## 2017-06-21 DIAGNOSIS — I2510 Atherosclerotic heart disease of native coronary artery without angina pectoris: Secondary | ICD-10-CM | POA: Insufficient documentation

## 2017-06-21 DIAGNOSIS — F329 Major depressive disorder, single episode, unspecified: Secondary | ICD-10-CM | POA: Diagnosis not present

## 2017-06-21 DIAGNOSIS — R109 Unspecified abdominal pain: Secondary | ICD-10-CM | POA: Insufficient documentation

## 2017-06-21 DIAGNOSIS — G894 Chronic pain syndrome: Secondary | ICD-10-CM

## 2017-06-21 DIAGNOSIS — R911 Solitary pulmonary nodule: Secondary | ICD-10-CM | POA: Insufficient documentation

## 2017-06-21 DIAGNOSIS — G546 Phantom limb syndrome with pain: Secondary | ICD-10-CM | POA: Diagnosis not present

## 2017-06-21 DIAGNOSIS — Z86718 Personal history of other venous thrombosis and embolism: Secondary | ICD-10-CM | POA: Insufficient documentation

## 2017-06-21 DIAGNOSIS — Z87891 Personal history of nicotine dependence: Secondary | ICD-10-CM | POA: Insufficient documentation

## 2017-06-21 DIAGNOSIS — M79605 Pain in left leg: Secondary | ICD-10-CM | POA: Diagnosis not present

## 2017-06-21 DIAGNOSIS — F419 Anxiety disorder, unspecified: Secondary | ICD-10-CM | POA: Insufficient documentation

## 2017-06-21 DIAGNOSIS — M810 Age-related osteoporosis without current pathological fracture: Secondary | ICD-10-CM | POA: Diagnosis not present

## 2017-06-21 DIAGNOSIS — J029 Acute pharyngitis, unspecified: Secondary | ICD-10-CM | POA: Insufficient documentation

## 2017-06-21 DIAGNOSIS — J449 Chronic obstructive pulmonary disease, unspecified: Secondary | ICD-10-CM | POA: Diagnosis not present

## 2017-06-21 DIAGNOSIS — Z23 Encounter for immunization: Secondary | ICD-10-CM | POA: Insufficient documentation

## 2017-06-21 DIAGNOSIS — K219 Gastro-esophageal reflux disease without esophagitis: Secondary | ICD-10-CM | POA: Insufficient documentation

## 2017-06-21 DIAGNOSIS — E1151 Type 2 diabetes mellitus with diabetic peripheral angiopathy without gangrene: Secondary | ICD-10-CM | POA: Insufficient documentation

## 2017-06-21 DIAGNOSIS — Z880 Allergy status to penicillin: Secondary | ICD-10-CM | POA: Insufficient documentation

## 2017-06-21 DIAGNOSIS — I700 Atherosclerosis of aorta: Secondary | ICD-10-CM | POA: Insufficient documentation

## 2017-06-21 DIAGNOSIS — D126 Benign neoplasm of colon, unspecified: Secondary | ICD-10-CM | POA: Insufficient documentation

## 2017-06-21 DIAGNOSIS — Z885 Allergy status to narcotic agent status: Secondary | ICD-10-CM | POA: Insufficient documentation

## 2017-06-21 DIAGNOSIS — G301 Alzheimer's disease with late onset: Secondary | ICD-10-CM

## 2017-06-21 DIAGNOSIS — Z89612 Acquired absence of left leg above knee: Secondary | ICD-10-CM | POA: Diagnosis not present

## 2017-06-21 DIAGNOSIS — J439 Emphysema, unspecified: Secondary | ICD-10-CM | POA: Insufficient documentation

## 2017-06-21 DIAGNOSIS — E1136 Type 2 diabetes mellitus with diabetic cataract: Secondary | ICD-10-CM | POA: Insufficient documentation

## 2017-06-21 DIAGNOSIS — N184 Chronic kidney disease, stage 4 (severe): Secondary | ICD-10-CM | POA: Insufficient documentation

## 2017-06-21 DIAGNOSIS — M25552 Pain in left hip: Secondary | ICD-10-CM | POA: Insufficient documentation

## 2017-06-21 DIAGNOSIS — F02818 Dementia in other diseases classified elsewhere, unspecified severity, with other behavioral disturbance: Secondary | ICD-10-CM

## 2017-06-21 DIAGNOSIS — R079 Chest pain, unspecified: Secondary | ICD-10-CM | POA: Insufficient documentation

## 2017-06-21 DIAGNOSIS — F0281 Dementia in other diseases classified elsewhere with behavioral disturbance: Secondary | ICD-10-CM | POA: Diagnosis not present

## 2017-06-21 DIAGNOSIS — M25551 Pain in right hip: Secondary | ICD-10-CM | POA: Insufficient documentation

## 2017-06-21 DIAGNOSIS — D509 Iron deficiency anemia, unspecified: Secondary | ICD-10-CM | POA: Diagnosis not present

## 2017-06-21 DIAGNOSIS — Z905 Acquired absence of kidney: Secondary | ICD-10-CM | POA: Insufficient documentation

## 2017-06-21 DIAGNOSIS — E785 Hyperlipidemia, unspecified: Secondary | ICD-10-CM | POA: Diagnosis not present

## 2017-06-21 DIAGNOSIS — I129 Hypertensive chronic kidney disease with stage 1 through stage 4 chronic kidney disease, or unspecified chronic kidney disease: Secondary | ICD-10-CM | POA: Insufficient documentation

## 2017-06-21 DIAGNOSIS — Z79899 Other long term (current) drug therapy: Secondary | ICD-10-CM | POA: Insufficient documentation

## 2017-06-21 DIAGNOSIS — M545 Low back pain: Secondary | ICD-10-CM | POA: Insufficient documentation

## 2017-06-21 MED ORDER — HYDROCODONE-ACETAMINOPHEN 7.5-325 MG PO TABS: 1.0000 | ORAL_TABLET | Freq: Three times a day (TID) | ORAL | 0 refills | Status: DC | PRN

## 2017-06-21 NOTE — Progress Notes (Signed)
Nursing Pain Medication Assessment:  Safety precautions to be maintained throughout the outpatient stay will include: orient to surroundings, keep bed in low position, maintain call bell within reach at all times, provide assistance with transfer out of bed and ambulation.  Medication Inspection Compliance: Pill count conducted under aseptic conditions, in front of the patient. Neither the pills nor the bottle was removed from the patient's sight at any time. Once count was completed pills were immediately returned to the patient in their original bottle.  Medication: See above Pill/Patch Count: 69 of 90 pills remain Pill/Patch Appearance: Markings consistent with prescribed medication Bottle Appearance: Standard pharmacy container. Clearly labeled. Filled Date: 1 / 21 / 2019 Last Medication intake:  Today

## 2017-06-21 NOTE — Progress Notes (Signed)
Patient's Name: Amy [name]  MRN: [id number]  Referring Provider: Roselee Nova, MD  DOB: 1935-07-29  PCP: Roselee Nova, MD  DOS: 06/21/2017  Note by: Gillis Santa, MD  Service setting: Ambulatory outpatient  Specialty: Interventional Pain Management  Location: [facility] (AMB) Pain Management Facility    Patient type: Established   Primary Reason(s) for Visit: Encounter for prescription drug management. (Level of risk: moderate)  CC: Leg Pain (left) and Back Pain (lower)  HPI  Amy [name] is a 82 y.o. year old, female patient, who comes today for a medication management evaluation. She has Phantom limb syndrome with pain ([place]); Anxiety; Elevated serum creatinine; Diabetes mellitus type 2, controlled ([place]); Acid reflux; Anxiety, generalized; AB (asthmatic bronchitis); Aortic atherosclerosis ([place]); Cataract; Leg pain; Continuous opioid dependence (HCC); CKD (chronic kidney disease) stage 4, GFR 15-29 ml/min (HCC); COPD, mild ([place]); Encounter for general adult medical examination without abnormal findings; Excessive falling; Fatty infiltration of liver; H/O adenomatous polyp of colon; Gravida 1; BP (high blood pressure); Hyperlipidemia; Flu vaccine need; Anterior knee pain; LBP (low back pain); Malaise and fatigue; OP (osteoporosis); Parity 1; Abnormal presence of protein in urine; PVD (peripheral vascular disease) ([place]); Allergic rhinitis; At risk for falling; Urgency of micturation; Renal mass; Abnormal EKG; Pre-operative cardiovascular examination; Renal neoplasm; Need for immunization against influenza; S/p nephrectomy; 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 Her primarily concern today is the Leg Pain (left) and Back Pain  (lower)  Pain Assessment: Location: Lower, Left Back Radiating: left hip Onset: More than a month ago Duration: Chronic pain Quality: Aching, Discomfort Severity: 3 /10 (self-reported pain score)  Note: Reported level is compatible with observation.                         When using our objective Pain Scale, levels between 6 and 10/10 are said to belong in an emergency room, as it progressively worsens from a 6/10, described as severely limiting, requiring emergency care not usually available at an outpatient pain management facility. At a 6/10 level, communication becomes difficult and requires great effort. Assistance to reach the emergency department may be required. Facial flushing and profuse sweating along with potentially dangerous increases in heart rate and blood pressure will be evident. Effect on ADL: can't sleep, stiff in the mornings Timing: Constant Modifying factors: medication  Amy [name] was last scheduled for an appointment on 04/26/2017 for medication management. During today's appointment we reviewed Amy [name] chronic pain status, as well as her outpatient medication regimen.  Patient presents for follow-up.  She is here for medication refill.  No change in her medical history since last visit.  She is doing fairly well and at baseline.  Her husband states that her dementia symptoms have improved since stopping benzodiazepines.  The patient  reports that she does not use drugs. Her body mass index is 28.34 kg/m.  Further details on both, my assessment(s), as well as the proposed treatment plan, please see below.  Controlled Substance Pharmacotherapy Assessment REMS (Risk Evaluation and Mitigation Strategy)  Analgesic: Hydrocodone 7.5 mg 3 times daily as needed MME/day: Approximately 24 mg/day.  Ignatius Specking, RN  06/21/2017 12:26 PM  Sign at close encounter Nursing Pain Medication Assessment:  Safety precautions to  be maintained throughout the outpatient  stay will include: orient to surroundings, keep bed in low position, maintain call bell within reach at all times, provide assistance with transfer out of bed and ambulation.  Medication Inspection Compliance: Pill count conducted under aseptic conditions, in front of the patient. Neither the pills nor the bottle was removed from the patient's sight at any time. Once count was completed pills were immediately returned to the patient in their original bottle.  Medication: See above Pill/Patch Count: 69 of 90 pills remain Pill/Patch Appearance: Markings consistent with prescribed medication Bottle Appearance: Standard pharmacy container. Clearly labeled. Filled Date: 1 / 21 / 2019 Last Medication intake:  Today   Pharmacokinetics: Liberation and absorption (onset of action): WNL Distribution (time to peak effect): WNL Metabolism and excretion (duration of action): WNL         Pharmacodynamics: Desired effects: Analgesia: Amy [name] reports >50% benefit. Functional ability: Patient reports that medication allows her to accomplish basic ADLs Clinically meaningful improvement in function (CMIF): Sustained CMIF goals met Perceived effectiveness: Described as relatively effective, allowing for increase in activities of daily living (ADL) Undesirable effects: Side-effects or Adverse reactions: None reported Monitoring: [place] PMP: Online review of the past 83-monthperiod conducted. Compliant with practice rules and regulations Last UDS on record: No results found for: SUMMARY UDS interpretation: Compliant          Medication Assessment Form: Reviewed. Patient indicates being compliant with therapy Treatment compliance: Compliant Risk Assessment Profile: Aberrant behavior: See prior evaluations. None observed or detected today Comorbid factors increasing risk of overdose: See prior notes. No additional risks detected today Risk of substance use disorder (SUD): Low Opioid Risk Tool - 06/21/17  1224      Family History of Substance Abuse   Alcohol  Negative    Illegal Drugs  Negative      Personal History of Substance Abuse   Alcohol  Negative    Illegal Drugs  Negative    Rx Drugs  Negative      Total Score   Opioid Risk Tool Scoring  0    Opioid Risk Interpretation  Low Risk      ORT Scoring interpretation table:  Score <3 = Low Risk for SUD  Score between 4-7 = Moderate Risk for SUD  Score >8 = High Risk for Opioid Abuse   Risk Mitigation Strategies:  Patient Counseling: Covered Patient-Prescriber Agreement (PPA): Present and active  Notification to other healthcare providers: Done  Pharmacologic Plan: No change in therapy, at this time.             Laboratory Chemistry  Inflammation Markers (CRP: Acute Phase) (ESR: Chronic Phase) Lab Results  Component Value Date   ESRSEDRATE 19 01/27/2017                 Rheumatology Markers Lab Results  Component Value Date   RF <14 01/27/2017   ANA NEGATIVE 01/27/2017                Renal Function Markers Lab Results  Component Value Date   BUN 11 06/15/2017   CREATININE 1.54 (H) 06/15/2017   GFRAA 37 (L) 06/15/2017   GFRNONAA 32 (L) 06/15/2017                 Hepatic Function Markers Lab Results  Component Value Date   AST 11 06/15/2017   ALT 5 (L) 06/15/2017   ALBUMIN 3.1 (L) 02/23/2017   ALKPHOS 70 02/23/2017  Electrolytes Lab Results  Component Value Date   NA 140 06/15/2017   K 4.8 06/15/2017   CL 104 06/15/2017   CALCIUM 8.5 (L) 06/15/2017   MG 1.2 (L) 10/05/2012                 Neuropathy Markers Lab Results  Component Value Date   VITAMINB12 456 03/31/2016   FOLATE 9.8 07/14/2016   HGBA1C 5.9 06/15/2017                 Bone Pathology Markers No results found for: [place], [id number], [id number], [id number], 25OHVITD1, 25OHVITD2, 25OHVITD3, TESTOFREE, TESTOSTERONE               Coagulation Parameters Lab Results  Component Value Date   PLT 154 02/23/2017                  Cardiovascular Markers Lab Results  Component Value Date   BNP 195 (H) 08/15/2014   TROPONINI < 0.03 08/15/2014   HGB 11.9 (L) 02/23/2017   HCT 35.8 02/23/2017                 CA Markers No results found for: CEA, CA125, LABCA2               Note: Lab results reviewed.  Recent Diagnostic Imaging Results  CT Chest Wo Contrast CLINICAL DATA:  Low back pain for 2 months. Anemia. Stage III renal cell carcinoma.  EXAM: CT CHEST, ABDOMEN AND PELVIS WITHOUT CONTRAST  TECHNIQUE: Multidetector CT imaging of the chest, abdomen and pelvis was performed following the standard protocol without IV contrast.  COMPARISON:  Chest radiograph 12/08/2016.  Prior CTs of 08/16/2016.  FINDINGS: CT CHEST FINDINGS  Cardiovascular: Advanced aortic and branch vessel atherosclerosis. Tortuous thoracic aorta. Mild cardiomegaly. No pericardial effusion. Multivessel coronary artery atherosclerosis.  Mediastinum/Nodes: No supraclavicular adenopathy. Right paratracheal node measures 9 mm today versus 11 mm on the prior. Hilar regions poorly evaluated without intravenous contrast.  Lungs/Pleura: No pleural fluid. Advanced bullous type emphysema. Pleural-based right upper lobe pulmonary nodule measures 7 x 5 mm on image 80/series 5. Compare 6 x 4 mm on the prior exam. 6 mm craniocaudal on sagittal image 67 today versus similar on the prior. Left lower lobe pulmonary nodule measures 3 mm on image 99/ series 5 may be minimally enlarged from 1-2 mm on the prior.  A subpleural left upper lobe pulmonary nodule is similar at 3 mm on image 50/series 5.  Musculoskeletal: Moderate to severe T12 compression deformity with mild ventral canal encroachment, similar. Minimal superior endplate irregularity at T6, chronic.  CT ABDOMEN PELVIS FINDINGS  Hepatobiliary: Normal noncontrast appearance of the liver. Cholecystectomy, without biliary ductal dilatation.  Pancreas: Normal, without mass or  ductal dilatation.  Spleen: Normal in size, without focal abnormality.  Adrenals/Urinary Tract: Normal right adrenal gland. Left nephrectomy. No locally recurrent disease. Right renal cortical thinning with lower pole low-density right renal lesions which are most consistent with cysts. The larger measures 7.7 cm. Right renal vascular calcifications. Mild bladder wall thickening.  Stomach/Bowel: Normal stomach, without wall thickening. Normal colon and terminal ileum. Normal small bowel.  Vascular/Lymphatic: Advanced aortic and branch vessel atherosclerosis. Infrarenal abdominal aortic nonaneurysmal dilatation is similar, including 2.6 cm. Bilateral iliac artery stents. No abdominopelvic adenopathy.  Reproductive: Hysterectomy.  No adnexal mass.  Other: No significant free fluid. Mild pelvic floor laxity. Ventral abdominal wall laxity is mild and contains fat.  Musculoskeletal: Osteopenia.  IMPRESSION: 1. Status post left nephrectomy, without  recurrent or metastatic disease in the abdomen/pelvis. 2. A pleural-based right upper lobe pulmonary nodule is felt to be similar. Apparent slight increased in dimensions on transverse imaging is felt to be due to differences in slice selection. Recommend attention on follow-up. 3.  Emphysema (ICD10-J43.9). 4. Coronary artery atherosclerosis. Aortic Atherosclerosis (ICD10-I70.0). 5. Mild bladder wall thickening, suggesting cystitis.  Electronically Signed   By: Abigail Miyamoto M.D.   On: 02/16/2017 11:47 CT Abdomen Pelvis Wo Contrast CLINICAL DATA:  Low back pain for 2 months. Anemia. Stage III renal cell carcinoma.  EXAM: CT CHEST, ABDOMEN AND PELVIS WITHOUT CONTRAST  TECHNIQUE: Multidetector CT imaging of the chest, abdomen and pelvis was performed following the standard protocol without IV contrast.  COMPARISON:  Chest radiograph 12/08/2016.  Prior CTs of 08/16/2016.  FINDINGS: CT CHEST FINDINGS  Cardiovascular: Advanced  aortic and branch vessel atherosclerosis. Tortuous thoracic aorta. Mild cardiomegaly. No pericardial effusion. Multivessel coronary artery atherosclerosis.  Mediastinum/Nodes: No supraclavicular adenopathy. Right paratracheal node measures 9 mm today versus 11 mm on the prior. Hilar regions poorly evaluated without intravenous contrast.  Lungs/Pleura: No pleural fluid. Advanced bullous type emphysema. Pleural-based right upper lobe pulmonary nodule measures 7 x 5 mm on image 80/series 5. Compare 6 x 4 mm on the prior exam. 6 mm craniocaudal on sagittal image 67 today versus similar on the prior. Left lower lobe pulmonary nodule measures 3 mm on image 99/ series 5 may be minimally enlarged from 1-2 mm on the prior.  A subpleural left upper lobe pulmonary nodule is similar at 3 mm on image 50/series 5.  Musculoskeletal: Moderate to severe T12 compression deformity with mild ventral canal encroachment, similar. Minimal superior endplate irregularity at T6, chronic.  CT ABDOMEN PELVIS FINDINGS  Hepatobiliary: Normal noncontrast appearance of the liver. Cholecystectomy, without biliary ductal dilatation.  Pancreas: Normal, without mass or ductal dilatation.  Spleen: Normal in size, without focal abnormality.  Adrenals/Urinary Tract: Normal right adrenal gland. Left nephrectomy. No locally recurrent disease. Right renal cortical thinning with lower pole low-density right renal lesions which are most consistent with cysts. The larger measures 7.7 cm. Right renal vascular calcifications. Mild bladder wall thickening.  Stomach/Bowel: Normal stomach, without wall thickening. Normal colon and terminal ileum. Normal small bowel.  Vascular/Lymphatic: Advanced aortic and branch vessel atherosclerosis. Infrarenal abdominal aortic nonaneurysmal dilatation is similar, including 2.6 cm. Bilateral iliac artery stents. No abdominopelvic adenopathy.  Reproductive: Hysterectomy.  No adnexal  mass.  Other: No significant free fluid. Mild pelvic floor laxity. Ventral abdominal wall laxity is mild and contains fat.  Musculoskeletal: Osteopenia.  IMPRESSION: 1. Status post left nephrectomy, without recurrent or metastatic disease in the abdomen/pelvis. 2. A pleural-based right upper lobe pulmonary nodule is felt to be similar. Apparent slight increased in dimensions on transverse imaging is felt to be due to differences in slice selection. Recommend attention on follow-up. 3.  Emphysema (ICD10-J43.9). 4. Coronary artery atherosclerosis. Aortic Atherosclerosis (ICD10-I70.0). 5. Mild bladder wall thickening, suggesting cystitis.  Electronically Signed   By: Abigail Miyamoto M.D.   On: 02/16/2017 11:47  Complexity Note: Imaging results reviewed. Results shared with Amy [name], using Layman's terms.                         Meds   Current Outpatient Medications:  .  acetaminophen (TYLENOL) 500 MG tablet, Take 500 mg by mouth every 6 (six) hours as needed. Pt takes 2 tablets as needed., Disp: , Rfl:  .  albuterol (PROVENTIL  HFA;VENTOLIN HFA) 108 (90 Base) MCG/ACT inhaler, Inhale 2 puffs into the lungs every 6 (six) hours as needed for wheezing or shortness of breath., Disp: 1 Inhaler, Rfl: 2 .  citalopram (CELEXA) 20 MG tablet, TAKE 1 TABLET (20 MG TOTAL) BY MOUTH AT BEDTIME., Disp: 90 tablet, Rfl: 0 .  clopidogrel (PLAVIX) 75 MG tablet, TAKE 1 TABLET EVERY DAY, Disp: 90 tablet, Rfl: 0 .  donepezil (ARICEPT) 5 MG tablet, Take 1 tablet by mouth at bedtime., Disp: , Rfl:  .  gabapentin (NEURONTIN) 300 MG capsule, Take 1 capsule (300 mg total) by mouth 3 (three) times daily., Disp: 270 capsule, Rfl: 3 .  glucose blood test strip, Use as instructed., Disp: 100 each, Rfl: 12 .  HYDROcodone-acetaminophen (NORCO) 7.5-325 MG tablet, Take 1 tablet by mouth 3 (three) times daily as needed., Disp: 90 tablet, Rfl: 0 .  lisinopril (PRINIVIL,ZESTRIL) 5 MG tablet, Take 1 tablet by mouth  daily., Disp: , Rfl:  .  metoprolol tartrate (LOPRESSOR) 50 MG tablet, Take 1 tablet (50 mg total) by mouth 2 (two) times daily., Disp: 180 tablet, Rfl: 0 .  pantoprazole (PROTONIX) 40 MG tablet, Take 1 tablet (40 mg total) by mouth daily., Disp: 90 tablet, Rfl: 1 .  QUEtiapine (SEROQUEL) 50 MG tablet, Take 50 mg by mouth at bedtime. , Disp: , Rfl:  .  ranitidine (ZANTAC) 150 MG capsule, Take 1 capsule (150 mg total) by mouth at bedtime., Disp: 90 capsule, Rfl: 0 .  simvastatin (ZOCOR) 40 MG tablet, Take 1 tablet (40 mg total) by mouth daily., Disp: 90 tablet, Rfl: 0 .  docusate sodium (COLACE) 100 MG capsule, Take 100 mg by mouth daily. Pt taking once a day with breakfast, Disp: , Rfl:  .  donepezil (ARICEPT) 5 MG tablet, Take 5 mg by mouth at bedtime., Disp: , Rfl:  .  ferrous sulfate 325 (65 FE) MG tablet, Take 1 tablet (325 mg total) by mouth 2 (two) times daily with a meal. (Patient not taking: Reported on 06/21/2017), Disp: 180 tablet, Rfl: 0 .  Multiple Vitamins-Minerals (HAIR SKIN AND NAILS FORMULA) TABS, Take by mouth. One tablet daily, Disp: , Rfl:   ROS  Constitutional: Denies any fever or chills Gastrointestinal: No reported hemesis, hematochezia, vomiting, or acute GI distress Musculoskeletal: Denies any acute onset joint swelling, redness, loss of ROM, or weakness Neurological: No reported episodes of acute onset apraxia, aphasia, dysarthria, agnosia, amnesia, paralysis, loss of coordination, or loss of consciousness  Allergies  Amy [name] is allergic to fentanyl and penicillins.  [place]  Drug: Amy [name]  reports that she does not use drugs. Alcohol:  reports that she does not drink alcohol. Tobacco:  reports that she quit smoking about 13 years ago. Her smoking use included cigarettes. She has a 76.50 pack-year smoking history. she has never used smokeless tobacco. Medical:  has a past medical history of Anxiety, Bilateral cataracts, Bronchitis, Cancer of kidney, left ([place])  (2017), Cataract, Chronic kidney disease, Depression, Diabetes mellitus without complication ([place]), Dry cough, DVT (deep venous thrombosis) ([place]), GERD (gastroesophageal reflux disease), History of frequent urinary tract infections, Hyperlipidemia, Hypertension, Left renal mass, Numbness and tingling, Obesity, Peripheral artery disease ([place]), Urinary frequency, and Urinary incontinence. Surgical: Amy [name]  has a past surgical history that includes Leg amputation; Abdominal hysterectomy (1978); Cholecystectomy; Carpal tunnel release (Bilateral); stent placement right leg ; and Robot assisted laparoscopic nephrectomy (Left, 01/15/2015). Family: family history includes Cancer in her brother, father, and mother; Diabetes in her brother.  Constitutional Exam  General appearance: Well nourished, well developed, and well hydrated. In no apparent acute distress Vitals:   06/21/17 1210  BP: 134/84  Pulse: (!) 59  Resp: 16  Temp: 97.8 F (36.6 C)  SpO2: 95%  Height: 5' 3"  (1.6 m)   BMI Assessment: Estimated body mass index is 28.34 kg/m as calculated from the following:   Height as of this encounter: 5' 3"  (1.6 m).   Weight as of 06/15/17: 160 lb (72.6 kg).  BMI interpretation table: BMI level Category Range association with higher incidence of chronic pain  <18 kg/m2 Underweight   18.5-24.9 kg/m2 Ideal body weight   25-29.9 kg/m2 Overweight Increased incidence by 20%  30-34.9 kg/m2 Obese (Class I) Increased incidence by 68%  35-39.9 kg/m2 Severe obesity (Class II) Increased incidence by 136%  >40 kg/m2 Extreme obesity (Class III) Increased incidence by 254%   BMI Readings from Last 4 Encounters:  06/21/17 28.34 kg/m  06/15/17 29.26 kg/m  04/26/17 29.26 kg/m  04/05/17 29.26 kg/m   Wt Readings from Last 4 Encounters:  06/15/17 160 lb (72.6 kg)  04/05/17 160 lb (72.6 kg)  09/13/16 153 lb 1.6 oz (69.4 kg)  09/08/16 155 lb (70.3 kg)  Psych/Mental status: Alert, oriented x 3 (person,  place, & time)       Eyes: PERLA Respiratory: No evidence of acute respiratory distress  Cervical Spine Area Exam  Skin & Axial Inspection: No masses, redness, edema, swelling, or associated skin lesions Alignment: Symmetrical Functional ROM: Unrestricted ROM      Stability: No instability detected Muscle Tone/Strength: Functionally intact. No obvious neuro-muscular anomalies detected. Sensory (Neurological): Unimpaired Palpation: No palpable anomalies              Upper Extremity (UE) Exam    Side: Right upper extremity  Side: Left upper extremity  Skin & Extremity Inspection: Skin color, temperature, and hair growth are WNL. No peripheral edema or cyanosis. No masses, redness, swelling, asymmetry, or associated skin lesions. No contractures.  Skin & Extremity Inspection: Skin color, temperature, and hair growth are WNL. No peripheral edema or cyanosis. No masses, redness, swelling, asymmetry, or associated skin lesions. No contractures.  Functional ROM: Unrestricted ROM          Functional ROM: Unrestricted ROM          Muscle Tone/Strength: Functionally intact. No obvious neuro-muscular anomalies detected.  Muscle Tone/Strength: Functionally intact. No obvious neuro-muscular anomalies detected.  Sensory (Neurological): Unimpaired          Sensory (Neurological): Unimpaired          Palpation: No palpable anomalies              Palpation: No palpable anomalies              Specialized Test(s): Deferred         Specialized Test(s): Deferred          Thoracic Spine Area Exam  Skin & Axial Inspection: No masses, redness, or swelling Alignment: Symmetrical Functional ROM: Unrestricted ROM Stability: No instability detected Muscle Tone/Strength: Functionally intact. No obvious neuro-muscular anomalies detected. Sensory (Neurological): Unimpaired Muscle strength & Tone: No palpable anomalies   Lumbar Spine Area Exam  Skin & Axial Inspection:No masses, redness, or  swelling Alignment:Symmetrical Functional AGT:[id number] ROM Stability:No instability detected Muscle Tone/Strength:Functionally intact. No obvious neuro-muscular anomalies detected. Sensory (Neurological):Unimpaired Palpation:No palpable anomalies Provocative Tests: Lumbar Hyperextension and rotation test:evaluation deferred today Lumbar Lateral bending test:evaluation deferred today Patrick's Maneuver:evaluation deferred today  Gait & Posture Assessment  Patient in wheelchair. Significant kyphosis.  Lower Extremity Exam    Side:Right lower extremity  Side:Left lower extremity  Skin & Extremity Inspection:Skin color, temperature, and hair growth are WNL. No peripheral edema or cyanosis. No masses, redness, swelling, asymmetry, or associated skin lesions. No contractures.  Skin & Extremity Inspection:Above knee amputation (AKA)  Functional RKY:[id number] ROM  Functional VVO:[id number] ROM  Muscle Tone/Strength:Functionally intact. No obvious neuro-muscular anomalies detected.  Muscle Tone/Strength:Functionally intact. No obvious neuro-muscular anomalies detected.  Sensory (Neurological):Unimpaired  Sensory (Neurological):Paresthesia (Tingling sensation)  Palpation:No palpable anomalies  Palpation:No palpable anomalies     Assessment  Primary Diagnosis & Pertinent Problem List: The primary encounter diagnosis was Phantom limb syndrome with pain ([place]). 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  Status Diagnosis  Controlled Controlled Controlled 1. Phantom limb syndrome with pain ([place])   2. Chronic pain syndrome   3. CKD (chronic  kidney disease) stage 4, GFR 15-29 ml/min (HCC)   4. Late onset Alzheimer's disease with behavioral disturbance   5. S/P AKA (above knee amputation) unilateral, left ([place])   6. 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 Treatment will often involve judicious use of pain medications and interventional procedures to decrease the pain, allowing the patient to participate in the physical activity that will ultimately produce long-lasting pain reductions. The goal of the multidisciplinary approach is to return the patient to a higher level of overall function and to restore their ability to perform activities of daily living.  82 year old female with a history of left above-the-knee amputation, phantom limb syndrome, chronic kidney disease, advanced dementia secondary to Alzheimer's disease with phantom limb pain and bilateral hip pain.  Patient has been stable on her opioid regimen of hydrocodone 7.5 mg 3 times daily as needed.  She is also on gabapentin 300 mg 3 times daily.  Here for medication refill on her hydrocodone.  PMP checked and appropriate.  Plan: -Hydrocodone at its current dose, 7.5 mg 3 times daily as needed.  Prescription for 3 months -Continue gabapentin 300 mg 3 times daily. -Continue other medications as prescribed.  Plan of Care  Pharmacotherapy (Medications Ordered): Meds ordered this encounter  Medications  . DISCONTD: HYDROcodone-acetaminophen (NORCO) 7.5-325 MG tablet    Sig: Take 1 tablet by mouth 3 (three) times daily as needed.    Dispense:  90 tablet    Refill:  0    For chronic pain To be filled on or after:  05/12/17, 06/11/17  . DISCONTD: HYDROcodone-acetaminophen (NORCO) 7.5-325 MG tablet    Sig: Take 1 tablet by mouth 3 (three) times daily as needed.    Dispense:  90 tablet    Refill:  0    For chronic pain To be filled on or after: 07/14/17, 08/10/17, 09/09/17  . DISCONTD: HYDROcodone-acetaminophen (NORCO) 7.5-325 MG tablet    Sig: Take 1 tablet by mouth 3 (three) times daily as needed.    Dispense:  90  tablet    Refill:  0    For chronic pain To be filled on or after: 07/14/17, 08/10/17, 09/09/17  . HYDROcodone-acetaminophen (NORCO) 7.5-325 MG tablet    Sig: Take 1 tablet by mouth 3 (three) times daily as needed.    Dispense:  90 tablet    Refill:  0    For chronic pain To be filled on or after: 07/14/17, 08/10/17, 09/09/17   Time Note: Greater than 50% of the 25 minute(s) of face-to-face time spent with Amy [name], was spent in counseling/coordination of care regarding: Amy [name] primary cause of pain, the treatment plan, treatment alternatives, medication side effects, the opioid analgesic risks and possible complications, the appropriate use of her medications and the medication agreement. Provider-requested follow-up: Return in about 3 months (around 09/19/2017) for Medication Management.  Future Appointments  Date Time Provider [place]  06/24/2017 10:30 AM [facility] VASC 1 [facility] None  06/24/2017 11:30 AM Algernon Huxley, MD [facility] None  08/16/2017 10:00 AM [facility]  08/24/2017  1:30 PM [facility] LAB [facility] None  08/24/2017  1:45 PM Lequita Asal, MD [facility] None  09/13/2017 11:20 AM Arnetha Courser, MD [facility]  09/15/2017 11:30 AM Gillis Santa, MD [facility] None    Primary Care Physician: Roselee Nova, MD Location: [facility] Note by: Gillis Santa, M.D Date: 06/21/2017; Time: 2:59 PM  There are no Patient Instructions on file for this visit.

## 2017-06-24 ENCOUNTER — Ambulatory Visit (INDEPENDENT_AMBULATORY_CARE_PROVIDER_SITE_OTHER): Payer: Medicare HMO

## 2017-06-24 ENCOUNTER — Encounter (INDEPENDENT_AMBULATORY_CARE_PROVIDER_SITE_OTHER): Payer: Self-pay | Admitting: Vascular Surgery

## 2017-06-24 ENCOUNTER — Ambulatory Visit (INDEPENDENT_AMBULATORY_CARE_PROVIDER_SITE_OTHER): Payer: Medicare HMO | Admitting: Vascular Surgery

## 2017-06-24 VITALS — BP 88/65 | HR 56 | Resp 16

## 2017-06-24 DIAGNOSIS — E1122 Type 2 diabetes mellitus with diabetic chronic kidney disease: Secondary | ICD-10-CM | POA: Diagnosis not present

## 2017-06-24 DIAGNOSIS — I739 Peripheral vascular disease, unspecified: Secondary | ICD-10-CM

## 2017-06-24 DIAGNOSIS — I10 Essential (primary) hypertension: Secondary | ICD-10-CM | POA: Diagnosis not present

## 2017-06-24 DIAGNOSIS — N184 Chronic kidney disease, stage 4 (severe): Secondary | ICD-10-CM | POA: Diagnosis not present

## 2017-06-24 DIAGNOSIS — Z89612 Acquired absence of left leg above knee: Secondary | ICD-10-CM

## 2017-06-24 NOTE — Patient Instructions (Signed)
Peripheral Vascular Disease Peripheral vascular disease (PVD) is a disease of the blood vessels that are not part of your heart and brain. A simple term for PVD is poor circulation. In most cases, PVD narrows the blood vessels that carry blood from your heart to the rest of your body. This can result in a decreased supply of blood to your arms, legs, and internal organs, like your stomach or kidneys. However, it most often affects a person's lower legs and feet. There are two types of PVD.  Organic PVD. This is the more common type. It is caused by damage to the structure of blood vessels.  Functional PVD. This is caused by conditions that make blood vessels contract and tighten (spasm).  Without treatment, PVD tends to get worse over time. PVD can also lead to acute ischemic limb. This is when an arm or limb suddenly has trouble getting enough blood. This is a medical emergency. What are the causes? Each type of PVD has many different causes. The most common cause of PVD is buildup of a fatty material (plaque) inside of your arteries (atherosclerosis). Small amounts of plaque can break off from the walls of the blood vessels and become lodged in a smaller artery. This blocks blood flow and can cause acute ischemic limb. Other common causes of PVD include:  Blood clots that form inside of blood vessels.  Injuries to blood vessels.  Diseases that cause inflammation of blood vessels or cause blood vessel spasms.  Health behaviors and health history that increase your risk of developing PVD.  What increases the risk? You may have a greater risk of PVD if you:  Have a family history of PVD.  Have certain medical conditions, including: ? High cholesterol. ? Diabetes. ? High blood pressure (hypertension). ? Coronary heart disease. ? Past problems with blood clots. ? Past injury, such as burns or a broken bone. These may have damaged blood vessels in your limbs. ? Buerger disease. This is  caused by inflamed blood vessels in your hands and feet. ? Some forms of arthritis. ? Rare birth defects that affect the arteries in your legs.  Use tobacco.  Do not get enough exercise.  Are obese.  Are age 50 or older.  What are the signs or symptoms? PVD may cause many different symptoms. Your symptoms depend on what part of your body is not getting enough blood. Some common signs and symptoms include:  Cramps in your lower legs. This may be a symptom of poor leg circulation (claudication).  Pain and weakness in your legs while you are physically active that goes away when you rest (intermittent claudication).  Leg pain when at rest.  Leg numbness, tingling, or weakness.  Coldness in a leg or foot, especially when compared with the other leg.  Skin or hair changes. These can include: ? Hair loss. ? Shiny skin. ? Pale or bluish skin. ? Thick toenails.  Inability to get or maintain an erection (erectile dysfunction).  People with PVD are more prone to developing ulcers and sores on their toes, feet, or legs. These may take longer than normal to heal. How is this diagnosed? Your health care provider may diagnose PVD from your signs and symptoms. The health care provider will also do a physical exam. You may have tests to find out what is causing your PVD and determine its severity. Tests may include:  Blood pressure recordings from your arms and legs and measurements of the strength of your pulses (  pulse volume recordings).  Imaging studies using sound waves to take pictures of the blood flow through your blood vessels (Doppler ultrasound).  Injecting a dye into your blood vessels before having imaging studies using: ? X-rays (angiogram or arteriogram). ? Computer-generated X-rays (CT angiogram). ? A powerful electromagnetic field and a computer (magnetic resonance angiogram or MRA).  How is this treated? Treatment for PVD depends on the cause of your condition and the  severity of your symptoms. It also depends on your age. Underlying causes need to be treated and controlled. These include long-lasting (chronic) conditions, such as diabetes, high cholesterol, and high blood pressure. You may need to first try making lifestyle changes and taking medicines. Surgery may be needed if these do not work. Lifestyle changes may include:  Quitting smoking.  Exercising regularly.  Following a low-fat, low-cholesterol diet.  Medicines may include:  Blood thinners to prevent blood clots.  Medicines to improve blood flow.  Medicines to improve your blood cholesterol levels.  Surgical procedures may include:  A procedure that uses an inflated balloon to open a blocked artery and improve blood flow (angioplasty).  A procedure to put in a tube (stent) to keep a blocked artery open (stent implant).  Surgery to reroute blood flow around a blocked artery (peripheral bypass surgery).  Surgery to remove dead tissue from an infected wound on the affected limb.  Amputation. This is surgical removal of the affected limb. This may be necessary in cases of acute ischemic limb that are not improved through medical or surgical treatments.  Follow these instructions at home:  Take medicines only as directed by your health care provider.  Do not use any tobacco products, including cigarettes, chewing tobacco, or electronic cigarettes. If you need help quitting, ask your health care provider.  Lose weight if you are overweight, and maintain a healthy weight as directed by your health care provider.  Eat a diet that is low in fat and cholesterol. If you need help, ask your health care provider.  Exercise regularly. Ask your health care provider to suggest some good activities for you.  Use compression stockings or other mechanical devices as directed by your health care provider.  Take good care of your feet. ? Wear comfortable shoes that fit well. ? Check your feet  often for any cuts or sores. Contact a health care provider if:  You have cramps in your legs while walking.  You have leg pain when you are at rest.  You have coldness in a leg or foot.  Your skin changes.  You have erectile dysfunction.  You have cuts or sores on your feet that are not healing. Get help right away if:  Your arm or leg turns cold and blue.  Your arms or legs become red, warm, swollen, painful, or numb.  You have chest pain or trouble breathing.  You suddenly have weakness in your face, arm, or leg.  You become very confused or lose the ability to speak.  You suddenly have a very bad headache or lose your vision. This information is not intended to replace advice given to you by your health care provider. Make sure you discuss any questions you have with your health care provider. Document Released: 06/17/2004 Document Revised: 10/16/2015 Document Reviewed: 10/18/2013 Elsevier Interactive Patient Education  2017 Elsevier Inc.  

## 2017-06-24 NOTE — Progress Notes (Signed)
MRN : [id number]  Amy [name] is a 82 y.o. (1936-05-03) female who presents with chief complaint of  Chief Complaint  Patient presents with  . Follow-up    73mth abi  .  History of Present Illness: Patient returns today in follow up of PAD.  She underwent a left leg amputation over a decade ago.  She is doing reasonably well although it appears her cognitive status has declined over time even more.  She has no new ulcerations or infection of her right leg.  She can no longer use her prosthesis because of her cognitive decline.  Her right leg ABI today is stable at 0.57 which is basically at the level it has been at the last couple of checks.  Current Outpatient Prescriptions  Medication Sig Dispense Refill  . acetaminophen (TYLENOL) 500 MG tablet Take 500 mg by mouth every 6 (six) hours as needed. Pt takes 2 tablets as needed.    . ALPRAZolam (XANAX) 0.5 MG tablet Take 1 tablet (0.5 mg total) by mouth 3 (three) times daily as needed for anxiety. 90 tablet 0  . citalopram (CELEXA) 20 MG tablet TAKE 1 TABLET AT BEDTIME 90 tablet 0  . clopidogrel (PLAVIX) 75 MG tablet TAKE 1 TABLET EVERY DAY 90 tablet 0  . docusate sodium (COLACE) 100 MG capsule Take 100 mg by mouth daily. Pt taking once a day with breakfast    . gabapentin (NEURONTIN) 300 MG capsule Take 1 capsule (300 mg total) by mouth 3 (three) times daily. 270 capsule 1  . glucose blood test strip Use as instructed. 100 each 12  . HYDROcodone-acetaminophen (NORCO) 7.5-325 MG tablet Take 1 tablet by mouth 3 (three) times daily as needed. 90 tablet 0  . metoprolol (LOPRESSOR) 50 MG tablet TAKE 1 TABLET TWICE DAILY 180 tablet 0  . Multiple Vitamins-Minerals (HAIR SKIN AND NAILS FORMULA) TABS Take by mouth. One tablet daily    . pantoprazole (PROTONIX) 40 MG tablet TAKE 1 TABLET (40 MG TOTAL) BY MOUTH DAILY. 90 tablet 1  . QUEtiapine (SEROQUEL) 25 MG tablet Take 25 mg by mouth at bedtime. Then increase to 2 qhs    .  ranitidine (ZANTAC) 150 MG capsule TAKE 1 CAPSULE (150 MG TOTAL) BY MOUTH AT BEDTIME. 90 capsule 1  . simvastatin (ZOCOR) 40 MG tablet TAKE 1 TABLET EVERY DAY 90 tablet 0  . VENTOLIN HFA 108 (90 BASE) MCG/ACT inhaler Inhale 1-2 puffs into the lungs every 4 (four) hours as needed for wheezing or shortness of breath.     . Vitamin D, Ergocalciferol, (DRISDOL) 50000 UNITS CAPS capsule      No current facility-administered medications for this visit.         Past Medical History:  Diagnosis Date  . Anxiety   . Bronchitis    hx of   . Cataract   . Chronic kidney disease   . Depression   . Diabetes mellitus without complication ([place])   . Dry cough   . 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 GERD (gastroesophageal reflux disease)   . History of frequent urinary tract infections   . Hyperlipidemia   . Hypertension   . Left renal mass   . Numbness and tingling    right hand   . Obesity   . Peripheral artery disease ([place])   . Urinary frequency   . Urinary incontinence  Past Surgical History:  Procedure Laterality Date  . ABDOMINAL HYSTERECTOMY  1978  . CARPAL TUNNEL RELEASE Bilateral   . CHOLECYSTECTOMY    . LEG AMPUTATION     above the knee / left   . ROBOT ASSISTED LAPAROSCOPIC NEPHRECTOMY Left 01/15/2015   Procedure: ROBOTIC ASSISTED LAPAROSCOPIC RADICAL NEPHRECTOMY; Surgeon: Alexis Frock, MD; Location: [facility]; Service: Urology; Laterality: Left;  . stent placement right leg       Social History      Social History  Substance Use Topics  . Smoking status: Former Smoker    Packs/day: 1.50    Years: 51.00    Types: Cigarettes    Quit date: 05/24/2004  . Smokeless tobacco: Never Used  . Alcohol use No    Family History       Family History  Problem Relation Age of Onset  . Cancer Mother     Stomach  . Cancer Father   . Diabetes Brother    . Cancer Brother          Allergies  Allergen Reactions  . Fentanyl Other (See Comments)    urine retention  . Penicillins Itching and Rash     REVIEW OF SYSTEMS(Negative unless checked)  Constitutional: [] Weight loss[] Fever[] Chills Cardiac:[] Chest pain[] Chest pressure[] Palpitations [] Shortness of breath when laying flat [] Shortness of breath at rest [] Shortness of breath with exertion. Vascular: [x] Pain in legs with walking[] Pain in legsat rest[] Pain in legs when laying flat [] Claudication [] Pain in feet when walking [] Pain in feet at rest [] Pain in feet when laying flat [] History of DVT [] Phlebitis [] Swelling in legs [] Varicose veins [] Non-healing ulcers Pulmonary: [] Uses home oxygen [] Productive cough[] Hemoptysis [] Wheeze [] COPD [] Asthma Neurologic: [] Dizziness [] Blackouts [] Seizures [] History of stroke [] History of TIA[] Aphasia [] Temporary blindness[] Dysphagia [] Weaknessor numbness in arms [] Weakness or numbnessin legs Musculoskeletal: [] Arthritis [] Joint swelling [] Joint pain [] Low back pain Hematologic:[] Easy bruising[] Easy bleeding [] Hypercoagulable state [] Anemic  Gastrointestinal:[] Blood in stool[] Vomiting blood[] Gastroesophageal reflux/heartburn[] Abdominal pain Genitourinary: [] Chronic kidney disease [] Difficulturination [] Frequenturination [] Burning with urination[] Hematuria Skin: [] Rashes [] Ulcers [] Wounds Psychological: [] History of anxiety[] History of major depression.       Physical Examination  BP (!) 88/65 (BP Location: Left Arm)   Pulse (!) 56   Resp 16  Gen:  WD/WN, NAD.  Obese Head: [place]/AT, No temporalis wasting. Ear/Nose/Throat: Hearing grossly intact, nares w/o erythema or drainage, trachea midline Eyes: Conjunctiva clear. Sclera non-icteric Neck: Supple.  No JVD.  Pulmonary:  Good air movement, no use of accessory  muscles.  Cardiac: RRR, normal S1, S2 Vascular:  Vessel Right Left  Radial Palpable Palpable                      Popliteal  not palpable  not palpable  PT  not palpable  not palpable  DP  trace palpable  not palpable    Musculoskeletal: Uses a wheelchair.  Left AKA.  No significant right leg swelling today Neurologic: Sensation grossly intact in extremities.  Symmetrical.  Speech is fluent.  Psychiatric: Judgment and insight are fair at best Dermatologic: No rashes or ulcers noted.  No cellulitis or open wounds.       Labs Recent Results (from the past 2160 hour(s))  Drug Screen 10 W/Conf, Serum     Status: Abnormal   Collection Time: 04/05/17 12:25 PM  Result Value Ref Range   Amphetamines, IA Negative Cutoff:50 ng/mL   Barbiturates, IA Negative Cutoff:0.1 ug/mL   Benzodiazepines, IA Negative Cutoff:20 ng/mL   Cocaine & Metabolite, IA Negative Cutoff:25 ng/mL   Phencyclidine, IA Negative Cutoff:8  ng/mL   THC(Marijuana) Metabolite, IA Negative Cutoff:5 ng/mL   Opiates, IA ++POSITIVE++ (A) Cutoff:5 ng/mL   Oxycodones, IA Negative Cutoff:5 ng/mL   Methadone, IA Negative Cutoff:25 ng/mL   Propoxyphene, IA Negative Cutoff:50 ng/mL    Comment: This test was developed and its performance characteristics determined by [facility].  It has not been cleared or approved by the Food and Drug Administration.   Oxycodones,MS,WB/Sp Rfx     Status: None   Collection Time: 04/05/17 12:25 PM  Result Value Ref Range   Oxycodones Confirmation Negative    Oxycocone Negative ng/mL   Oxymorphone Negative ng/mL    Comment: Confirmation threshold: 1.0 ng/mL  Opiates,MS,WB/Sp Rfx     Status: None   Collection Time: 04/05/17 12:25 PM  Result Value Ref Range   Opiate Confirmation Positive    Codeine Negative ng/mL   Morphine Negative ng/mL   6-Acetylmorphine Negative    Hydrocodone 44.6 ng/mL   Hydromorphone Negative ng/mL   Dihydrocodeine 12.0 ng/mL    Comment: Confirmation threshold:  1.0 ng/mL  POCT HgB A1C     Status: Normal   Collection Time: 06/15/17  2:50 PM  Result Value Ref Range   Hemoglobin A1C 5.9   Lipid panel     Status: Abnormal   Collection Time: 06/15/17  3:15 PM  Result Value Ref Range   Cholesterol 149 <200 mg/dL   HDL 41 (L) >50 mg/dL   Triglycerides 235 (H) <150 mg/dL   LDL Cholesterol (Calc) 75 mg/dL (calc)    Comment: Reference range: <100 . Desirable range <100 mg/dL for primary prevention;   <70 mg/dL for patients with CHD or diabetic patients  with > or = 2 CHD risk factors. [name] LDL-C is now calculated using the Martin-Hopkins  calculation, which is a validated novel method providing  better accuracy than the Friedewald equation in the  estimation of LDL-C.  Cresenciano Genre et al. Annamaria Helling. 6754;492(01): 2061-2068  (http://education.[facility].com/faq/FAQ164)    Total CHOL/HDL Ratio 3.6 <5.0 (calc)   Non-HDL Cholesterol (Calc) 108 <130 mg/dL (calc)    Comment: For patients with diabetes plus 1 major ASCVD risk  factor, treating to a non-HDL-C goal of <100 mg/dL  (LDL-C of <70 mg/dL) is considered a therapeutic  option.   COMPLETE METABOLIC PANEL WITH GFR     Status: Abnormal   Collection Time: 06/15/17  3:15 PM  Result Value Ref Range   Glucose, Bld 87 65 - 99 mg/dL    Comment: .            Fasting reference interval .    BUN 11 7 - 25 mg/dL   Creat 1.54 (H) 0.60 - 0.93 mg/dL    Comment: For patients >49 years of age, the reference limit for Creatinine is approximately 13% higher for people identified as African-American. .    GFR, Est Non African American 32 (L) > OR = 60 mL/min/1.74m2   GFR, Est African American 37 (L) > OR = 60 mL/min/1.58m2   BUN/Creatinine Ratio 7 6 - 22 (calc)   Sodium 140 135 - 146 mmol/L   Potassium 4.8 3.5 - 5.3 mmol/L   Chloride 104 98 - 110 mmol/L   CO2 30 20 - 32 mmol/L   Calcium 8.5 (L) 8.6 - 10.4 mg/dL   Total Protein 6.0 (L) 6.1 - 8.1 g/dL   Albumin 3.3 (L) 3.6 - 5.1 g/dL   Globulin 2.7 1.9  - 3.7 g/dL (calc)   AG Ratio 1.2 1.0 - 2.5 (calc)  Total Bilirubin 0.5 0.2 - 1.2 mg/dL   Alkaline phosphatase (APISO) 77 33 - 130 U/L   AST 11 10 - 35 U/L   ALT 5 (L) 6 - 29 U/L    Radiology No results found.    Assessment/Plan Diabetes mellitus type 2, controlled blood glucose control important in reducing the progression of atherosclerotic disease. Also, involved in wound healing. On appropriate medications.   BP (high blood pressure) blood pressure control important in reducing the progression of atherosclerotic disease. On appropriate oral medications.   S/P AKA (above knee amputation) unilateral, left ([place]) About 11 years ago. Stable and well healed. With her cognitive decline she is no longer able to use her prosthesis.   PVD (peripheral vascular disease) (HCC) Her right leg ABI today is stable at 0.57 which is basically at the level it has been at the last couple of checks. No role for intervention with no limb threatening symptoms.  Recheck in 6 months.  Continue current medical regimen.    Leotis Pain, MD  06/24/2017 12:09 PM    This note was created with Dragon medical transcription system.  Any errors from dictation are purely unintentional

## 2017-06-24 NOTE — Assessment & Plan Note (Signed)
Her right leg ABI today is stable at 0.57 which is basically at the level it has been at the last couple of checks. No role for intervention with no limb threatening symptoms.  Recheck in 6 months.  Continue current medical regimen.

## 2017-06-27 ENCOUNTER — Telehealth: Payer: Self-pay

## 2017-06-27 DIAGNOSIS — E781 Pure hyperglyceridemia: Secondary | ICD-10-CM

## 2017-06-27 MED ORDER — ATORVASTATIN CALCIUM 20 MG PO TABS
20.0000 mg | ORAL_TABLET | Freq: Every day | ORAL | 0 refills | Status: DC
Start: 2017-06-27 — End: 2017-10-04

## 2017-06-27 NOTE — Telephone Encounter (Signed)
Spoke to the patient husband ( he is on [place]) and I apologize to him due to the concerns that the patient husband had was never routed to me and I don't see it was routed to Dr. Manuella Ghazi. Mention to him that he should not increased the dosage of simvastatin but keep taking what she is taking until they received the Lipitor 20 mg tab. Pt asked why his wife cholesterol is high Went over the labs in more detail and asked him what his wife is eating and he mention that she eats a lot of fired food and that is something they would have to avoid. He asked for the New medicine to be sent to [facility]. Went ober the instructions with the patient and how she is suppose to take it and what to look for as for as side effects. Pt Husband understood.

## 2017-06-27 NOTE — Telephone Encounter (Signed)
Copied from [place] [id number]. Topic: General - Other >> Jun 27, 2017 10:17 AM Yvette Rack wrote: Reason for CRM: patient husband calling stating that someone was suppose to have called him back about how much his wife cholesterol medicine she is suppose to take she was suppose to up her dosage but never got a call back about how much

## 2017-07-11 ENCOUNTER — Telehealth: Payer: Self-pay | Admitting: Family Medicine

## 2017-07-11 NOTE — Telephone Encounter (Signed)
Copied from [place] [id number]. Topic: Quick Communication - See Telephone Encounter >> Jul 11, 2017 11:49 AM Arletha Grippe wrote: CRM for notification. See Telephone encounter for:   07/11/17. Husband called - he would like clarification on the new cholesterol medication -  he was told she would be getting a stonger medication, but the mg is lower on the new me. Please call [phone] . Please call as soon as you can, pt has running to do today

## 2017-07-11 NOTE — Telephone Encounter (Signed)
Patient husband ( he is on [place]) did not know if he had to stop the simvastatin or continue this medication along with atorvastatin. I informed him to stop the simvastatin  40 mg take 1 tab at 6pm and start the Atorvastatin 20 mg tab take at bedtime. Informed the patient it can be confusing but  the danger of mixing two medication. I asked did he mix the two medications together and pt states he did not; He wanted to call first before doing anything. I praise the patient for calling to check and if he has any questions about any of her medication please do not hesitate to call or he can speak to his local pharmacy if he is unable to get through to [place]. Pt has been informed to discard the simvastatin 40 mg and to start the atorvastatin 20 mg.

## 2017-07-21 ENCOUNTER — Other Ambulatory Visit: Payer: Self-pay

## 2017-07-21 DIAGNOSIS — F0281 Dementia in other diseases classified elsewhere with behavioral disturbance: Secondary | ICD-10-CM | POA: Diagnosis not present

## 2017-07-21 DIAGNOSIS — F5104 Psychophysiologic insomnia: Secondary | ICD-10-CM | POA: Diagnosis not present

## 2017-07-21 DIAGNOSIS — G546 Phantom limb syndrome with pain: Secondary | ICD-10-CM | POA: Diagnosis not present

## 2017-07-21 DIAGNOSIS — G4719 Other hypersomnia: Secondary | ICD-10-CM | POA: Diagnosis not present

## 2017-07-21 DIAGNOSIS — F0151 Vascular dementia with behavioral disturbance: Secondary | ICD-10-CM | POA: Diagnosis not present

## 2017-07-21 DIAGNOSIS — G309 Alzheimer's disease, unspecified: Secondary | ICD-10-CM | POA: Diagnosis not present

## 2017-07-21 DIAGNOSIS — F411 Generalized anxiety disorder: Secondary | ICD-10-CM | POA: Diagnosis not present

## 2017-07-21 DIAGNOSIS — I739 Peripheral vascular disease, unspecified: Secondary | ICD-10-CM

## 2017-07-21 DIAGNOSIS — Z8659 Personal history of other mental and behavioral disorders: Secondary | ICD-10-CM | POA: Diagnosis not present

## 2017-07-21 NOTE — Telephone Encounter (Signed)
Amy [name] pt, will be out of meds 08/17/17, appt not until 09/13/17. Please advise.

## 2017-07-22 NOTE — Telephone Encounter (Signed)
Please contact Dr. Lucky Cowboy and see if he will agree to prescribe her Plavix going forward (if she is taking it for PVD) Thank you

## 2017-07-27 ENCOUNTER — Telehealth (INDEPENDENT_AMBULATORY_CARE_PROVIDER_SITE_OTHER): Payer: Self-pay | Admitting: Vascular Surgery

## 2017-07-27 DIAGNOSIS — N390 Urinary tract infection, site not specified: Secondary | ICD-10-CM | POA: Diagnosis not present

## 2017-07-27 DIAGNOSIS — I129 Hypertensive chronic kidney disease with stage 1 through stage 4 chronic kidney disease, or unspecified chronic kidney disease: Secondary | ICD-10-CM | POA: Diagnosis not present

## 2017-07-27 DIAGNOSIS — N183 Chronic kidney disease, stage 3 (moderate): Secondary | ICD-10-CM | POA: Diagnosis not present

## 2017-07-27 DIAGNOSIS — D631 Anemia in chronic kidney disease: Secondary | ICD-10-CM | POA: Diagnosis not present

## 2017-07-27 DIAGNOSIS — R809 Proteinuria, unspecified: Secondary | ICD-10-CM | POA: Diagnosis not present

## 2017-07-27 DIAGNOSIS — N2581 Secondary hyperparathyroidism of renal origin: Secondary | ICD-10-CM | POA: Diagnosis not present

## 2017-07-27 NOTE — Telephone Encounter (Signed)
Called left message with Dr. Lucky Cowboy, if he will prescribe

## 2017-07-28 ENCOUNTER — Other Ambulatory Visit (INDEPENDENT_AMBULATORY_CARE_PROVIDER_SITE_OTHER): Payer: Self-pay

## 2017-07-28 DIAGNOSIS — E1122 Type 2 diabetes mellitus with diabetic chronic kidney disease: Secondary | ICD-10-CM | POA: Diagnosis not present

## 2017-07-28 DIAGNOSIS — I739 Peripheral vascular disease, unspecified: Secondary | ICD-10-CM

## 2017-07-28 DIAGNOSIS — I10 Essential (primary) hypertension: Secondary | ICD-10-CM | POA: Diagnosis not present

## 2017-07-28 DIAGNOSIS — I129 Hypertensive chronic kidney disease with stage 1 through stage 4 chronic kidney disease, or unspecified chronic kidney disease: Secondary | ICD-10-CM | POA: Diagnosis not present

## 2017-07-28 DIAGNOSIS — N183 Chronic kidney disease, stage 3 (moderate): Secondary | ICD-10-CM | POA: Diagnosis not present

## 2017-07-28 DIAGNOSIS — N2889 Other specified disorders of kidney and ureter: Secondary | ICD-10-CM | POA: Diagnosis not present

## 2017-07-28 DIAGNOSIS — N2581 Secondary hyperparathyroidism of renal origin: Secondary | ICD-10-CM | POA: Diagnosis not present

## 2017-07-28 DIAGNOSIS — R809 Proteinuria, unspecified: Secondary | ICD-10-CM | POA: Diagnosis not present

## 2017-07-28 DIAGNOSIS — N390 Urinary tract infection, site not specified: Secondary | ICD-10-CM | POA: Diagnosis not present

## 2017-07-28 DIAGNOSIS — D631 Anemia in chronic kidney disease: Secondary | ICD-10-CM | POA: Diagnosis not present

## 2017-07-28 MED ORDER — CLOPIDOGREL BISULFATE 75 MG PO TABS: 75.0000 mg | ORAL_TABLET | Freq: Every day | ORAL | 3 refills | Status: DC

## 2017-07-28 NOTE — Telephone Encounter (Signed)
The medication has been refilled and sent to [facility]. Called the patient to let her know that Dr. Lucky Cowboy is willing to prescribe the medication.

## 2017-08-08 ENCOUNTER — Ambulatory Visit (INDEPENDENT_AMBULATORY_CARE_PROVIDER_SITE_OTHER): Payer: Medicare HMO | Admitting: Nurse Practitioner

## 2017-08-08 ENCOUNTER — Telehealth: Payer: Self-pay | Admitting: Family Medicine

## 2017-08-08 ENCOUNTER — Encounter: Payer: Self-pay | Admitting: Nurse Practitioner

## 2017-08-08 ENCOUNTER — Ambulatory Visit: Payer: Self-pay | Admitting: *Deleted

## 2017-08-08 ENCOUNTER — Ambulatory Visit
Admission: RE | Admit: 2017-08-08 | Discharge: 2017-08-08 | Disposition: A | Discharge status: Home / Self Care | Payer: Medicare HMO | Source: Ambulatory Visit | Attending: Nurse Practitioner | Admitting: Nurse Practitioner

## 2017-08-08 VITALS — BP 132/84 | HR 99 | Temp 98.1°F | Resp 18

## 2017-08-08 DIAGNOSIS — D649 Anemia, unspecified: Secondary | ICD-10-CM | POA: Diagnosis not present

## 2017-08-08 DIAGNOSIS — I700 Atherosclerosis of aorta: Secondary | ICD-10-CM | POA: Diagnosis not present

## 2017-08-08 DIAGNOSIS — R0602 Shortness of breath: Secondary | ICD-10-CM

## 2017-08-08 DIAGNOSIS — R079 Chest pain, unspecified: Secondary | ICD-10-CM

## 2017-08-08 DIAGNOSIS — R059 Cough, unspecified: Secondary | ICD-10-CM

## 2017-08-08 DIAGNOSIS — M4854XA Collapsed vertebra, not elsewhere classified, thoracic region, initial encounter for fracture: Secondary | ICD-10-CM | POA: Diagnosis not present

## 2017-08-08 DIAGNOSIS — R05 Cough: Secondary | ICD-10-CM | POA: Diagnosis not present

## 2017-08-08 DIAGNOSIS — M438X4 Other specified deforming dorsopathies, thoracic region: Secondary | ICD-10-CM | POA: Insufficient documentation

## 2017-08-08 DIAGNOSIS — Z89612 Acquired absence of left leg above knee: Secondary | ICD-10-CM

## 2017-08-08 DIAGNOSIS — R062 Wheezing: Secondary | ICD-10-CM | POA: Diagnosis not present

## 2017-08-08 LAB — CBC WITH DIFFERENTIAL/PLATELET
BASOS ABS: 63 {cells}/uL (ref 0–200)
Basophils Relative: 1 %
EOS PCT: 2.2 %
Eosinophils Absolute: 139 cells/uL (ref 15–500)
HCT: 36.8 % (ref 35.0–45.0)
HEMOGLOBIN: 12.1 g/dL (ref 11.7–15.5)
Lymphs Abs: 1827 cells/uL (ref 850–3900)
MCH: 30 pg (ref 27.0–33.0)
MCHC: 32.9 g/dL (ref 32.0–36.0)
MCV: 91.1 fL (ref 80.0–100.0)
MONOS PCT: 5.8 %
MPV: 11.3 fL (ref 7.5–12.5)
NEUTROS ABS: 3906 {cells}/uL (ref 1500–7800)
NEUTROS PCT: 62 %
Platelets: 189 10*3/uL (ref 140–400)
RBC: 4.04 10*6/uL (ref 3.80–5.10)
RDW: 13.8 % (ref 11.0–15.0)
Total Lymphocyte: 29 %
WBC mixed population: 365 cells/uL (ref 200–950)
WBC: 6.3 10*3/uL (ref 3.8–10.8)

## 2017-08-08 MED ORDER — BENZONATATE 100 MG PO CAPS: 100.0000 mg | ORAL_CAPSULE | Freq: Two times a day (BID) | ORAL | 0 refills | Status: DC | PRN

## 2017-08-08 MED ORDER — ALBUTEROL SULFATE (2.5 MG/3ML) 0.083% IN NEBU: 2.5000 mg | INHALATION_SOLUTION | Freq: Once | RESPIRATORY_TRACT | Status: DC

## 2017-08-08 NOTE — Telephone Encounter (Signed)
[facility] Radiology Department, Berniece Salines, [place], called to give a call report chest x-ray, results read and read back verified by results in computer. Unable to call the office due to office closed at this time, will route to office.

## 2017-08-08 NOTE — Telephone Encounter (Signed)
Husband called in for wife.   Stating his wife is having a pain in her right chest under her right breast with a deep breath.   She woke up like this.   When takes a deep breath it hurts.  Not coughing or having symptoms of URI.    It only hurts when she breaths.   (She has dementia). She has not fallen out of bed or fell.   She gets short of breath with any activity which is normal for her.     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    She is not hurting right now.    She goes to a pain management doctor who manages all her pain medications.     She used to see Dr. Brigitte Pulse but is transferring over to Dr. Sanda Klein.   Her appt is on September 13, 2017 Dr. Sanda Klein has not seen her yet.     I called the flow coordinator at [facility]  They made an appt with Clyde Lundborg, NP for today at 1:20.   I let the husband know and they will be there around 1:00 to be registered.

## 2017-08-08 NOTE — Patient Instructions (Addendum)
Please use your inhalers as prescribed.  Take tylenol as needed for pain.  We will draw a CBC today, Go across the street now to get your x-ray results. If you have an infection I will send you antibiotics. If you have worsening shortness of breath, chest pain or sudden fatigue please go to the ER.  I am sending a [facility] referral so you can get some assistance at home.  I will follow- up with you

## 2017-08-08 NOTE — Progress Notes (Addendum)
Name: Amy [name]   MRN: [id number]    DOB: 01/30/1936   Date:08/08/2017       Progress Note  Subjective  Chief Complaint  Chief Complaint  Patient presents with  . RUQ    onset this am, with mild cough and sob    HPI  Patient presents with [name]-husband, for evaluation of pain under right breast. Patient states its been ongoing for a few days that is worse with agitation. Pain is intermittent and lasts a few minutes. Has not found anything that makes it better. Described as achy pain, non-radiating. Worse with movement and inspiration. Husband states patient started complaining about pain this morning when getting out of bed and it hurts more with deep inspiration. No redness, tenderness or nodules palpated.  Patient endorses mild coughing. Endorses loose stools. Husband stopped giving patient iron pills 3 months ago- he believed it contributed to her loose stools.  Endorses more stress, denies sleep disturbances with medications. Diet is limited due to partial dentures and oral pain but patient didn't want to see dentist per husband.  Falls- twice in the last three months both times during transfers- patient can self transfer but typically requires assistance. Both falls have been due to wheelchair rolling back even when locked. Last fall was last week- denies injuries. Denies n/v, dysphagia, belching.  Husband notes some chronic fatigue and shob that has been ongoing since 2016- patient is non-compliant with inhalers. Husband is sole caretaker and notes difficulty in caring for patient due to increase needs and patient occasionally become discontent with him providing care.    Patient has  alzheimer's with behavioral disturbances and Renal Cancer T2, N1, M0 as of 01/08/2017. Note from Dr. Mike Gip on 02/23/2017 notes patient was complaining of right ribcage pain at that time due to "coughing so much".       Patient Active Problem List   Diagnosis Date Noted  . Chronic pain  syndrome 04/05/2017  . Depression 11/15/2016  . Iron deficiency anemia 09/08/2016  . Weight loss 07/17/2016  . Anemia 06/29/2016  . Dementia with behavioral disturbance 06/15/2016  . Late onset Alzheimer's disease with behavioral disturbance 06/14/2016  . S/P AKA (above knee amputation) unilateral, left ([place]) 06/08/2016  . Wheelchair dependent 03/11/2016  . 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 Need for immunization against influenza 01/29/2015  . S/p nephrectomy 01/29/2015  . Renal neoplasm 01/15/2015  . Pre-operative cardiovascular examination 01/07/2015  . Abnormal EKG 01/02/2015  . Renal mass 12/05/2014  . Acid reflux 11/20/2014  . GAD (generalized anxiety disorder) 11/20/2014  . AB (asthmatic bronchitis) 11/20/2014  . Aortic atherosclerosis ([place]) 11/20/2014  . Cataract 11/20/2014  . Leg pain 11/20/2014  . Continuous opioid dependence ([place]) 11/20/2014  . CKD (chronic kidney disease) stage 4, GFR 15-29 ml/min (HCC) 11/20/2014  . COPD, mild ([place]) 11/20/2014  . Encounter for general adult medical examination without abnormal findings 11/20/2014  . Excessive falling 11/20/2014  . Fatty infiltration of liver 11/20/2014  . BP (high blood pressure) 11/20/2014  . Hyperlipidemia 11/20/2014  . Anterior knee pain 11/20/2014  . LBP (low back pain) 11/20/2014  . Malaise and fatigue 11/20/2014  . OP (osteoporosis) 11/20/2014  . Parity 1 11/20/2014  . Abnormal presence of protein in urine 11/20/2014  . PVD (peripheral vascular disease) ([place]) 11/20/2014  . Allergic rhinitis 11/20/2014  . At risk for falling 11/20/2014  . Phantom limb syndrome with pain ([place]) 11/05/2014  . Anxiety 11/05/2014  . Elevated  serum creatinine 11/05/2014  . Diabetes mellitus type 2, controlled ([place]) 11/05/2014  . H/O adenomatous polyp of colon 07/27/2010    Social History   Tobacco Use  . Smoking status: Former Smoker    Packs/day: 1.50    Years: 51.00    Pack years:  76.50    Types: Cigarettes    Last attempt to quit: 05/24/2004    Years since quitting: 13.2  . Smokeless tobacco: Never Used  Substance Use Topics  . Alcohol use: No     Current Outpatient Medications:  .  acetaminophen (TYLENOL) 500 MG tablet, Take 500 mg by mouth every 6 (six) hours as needed. Pt takes 2 tablets as needed., Disp: , Rfl:  .  albuterol (PROVENTIL HFA;VENTOLIN HFA) 108 (90 Base) MCG/ACT inhaler, Inhale 2 puffs into the lungs every 6 (six) hours as needed for wheezing or shortness of breath., Disp: 1 Inhaler, Rfl: 2 .  atorvastatin (LIPITOR) 20 MG tablet, Take 1 tablet (20 mg total) by mouth at bedtime., Disp: 90 tablet, Rfl: 0 .  citalopram (CELEXA) 20 MG tablet, TAKE 1 TABLET (20 MG TOTAL) BY MOUTH AT BEDTIME., Disp: 90 tablet, Rfl: 0 .  clopidogrel (PLAVIX) 75 MG tablet, Take 1 tablet (75 mg total) by mouth daily., Disp: 90 tablet, Rfl: 3 .  docusate sodium (COLACE) 100 MG capsule, Take 100 mg by mouth daily. Pt taking once a day with breakfast, Disp: , Rfl:  .  donepezil (ARICEPT) 5 MG tablet, Take 5 mg by mouth at bedtime., Disp: , Rfl:  .  donepezil (ARICEPT) 5 MG tablet, Take 1 tablet by mouth at bedtime., Disp: , Rfl:  .  ferrous sulfate 325 (65 FE) MG tablet, Take 1 tablet (325 mg total) by mouth 2 (two) times daily with a meal. (Patient not taking: Reported on 06/24/2017), Disp: 180 tablet, Rfl: 0 .  gabapentin (NEURONTIN) 300 MG capsule, Take 1 capsule (300 mg total) by mouth 3 (three) times daily., Disp: 270 capsule, Rfl: 3 .  glucose blood test strip, Use as instructed., Disp: 100 each, Rfl: 12 .  HYDROcodone-acetaminophen (NORCO) 7.5-325 MG tablet, Take 1 tablet by mouth 3 (three) times daily as needed., Disp: 90 tablet, Rfl: 0 .  lisinopril (PRINIVIL,ZESTRIL) 5 MG tablet, Take 1 tablet by mouth daily., Disp: , Rfl:  .  metoprolol tartrate (LOPRESSOR) 50 MG tablet, Take 1 tablet (50 mg total) by mouth 2 (two) times daily., Disp: 180 tablet, Rfl: 0 .  Multiple  Vitamins-Minerals (HAIR SKIN AND NAILS FORMULA) TABS, Take by mouth. One tablet daily, Disp: , Rfl:  .  pantoprazole (PROTONIX) 40 MG tablet, Take 1 tablet (40 mg total) by mouth daily., Disp: 90 tablet, Rfl: 1 .  QUEtiapine (SEROQUEL) 50 MG tablet, Take 50 mg by mouth at bedtime. , Disp: , Rfl:  .  ranitidine (ZANTAC) 150 MG capsule, TAKE 1 CAPSULE AT BEDTIME, Disp: 90 capsule, Rfl: 0  Allergies  Allergen Reactions  . Fentanyl Other (See Comments)    urine retention  . Penicillins Itching and Rash    ROS   Constitutional: Negative for fever or weight change.  Respiratory: Positive for cough and shortness of breath.   Cardiovascular: Positive for right chest pain Negative palpitations.  Gastrointestinal: Negative for abdominal pain, no bowel changes- loose stools have been ongoing for greater than 1 year.  Musculoskeletal: Positive for gait problem chronic, in wheel chair Negative  joint swelling.  Skin: Negative for rash.  Neurological: Negative for dizziness or headache.  No  other specific complaints in a complete review of systems (except as listed in HPI above).  Objective  Vitals:   08/08/17 1328  BP: 132/84  Pulse: 99  Resp: 18  Temp: 98.1 F (36.7 C)  TempSrc: Oral  SpO2: 92%     There is no height or weight on file to calculate BMI.  Nursing Note and Vital Signs reviewed.  Physical Exam  Musculoskeletal:       Arms:    Constitutional: Patient appears well-developed and well-nourished. Obese. No distress.  HEENT: head atraumatic, normocephalic, pupils equal and reactive to light, EOM's intact, TM's without erythema or bulging,  no maxillary or frontal sinus tenderness, neck supple - pt endorses mild right neck tenderness with palpation and movement, ROM intact. oropharynx pink and moist without exudate Cardiovascular: Normal rate, regular rhythm, S1/S2 present.  No murmur or rub heard. No significant edema. Non-tender chest. Pulmonary/Chest: Effort normal and  breath sounds- moderate expiratory wheezing auscultated bilaterally. No respiratory distress or retractions. Abdominal: Soft and non-tender, bowel sounds present x4 quadrants.  No CVA Tenderness  Psychiatric: Patient has a normal mood and affect. behavior is normal. Judgment and thought content normal. Neuro: Patient alert and oriented to self, location and month, disoriented to husbands relationship- sts he is her step dad. Able to follow commands.  MSK: AKA on left- unchanged stable   Assessment & Plan  1. Right-sided chest pain  - Take tylenol OTC for relief of symptoms - Alternate between Ice/Heat if it provides relief  - EKG 12-Lead Reading. No acute abnormalities noted- normal rate, unable to appreciate clear p-waves, QRS complex <0.12; Reviewed with Dr. Sanda Klein; Sent to Cardiologist: [facility] Chest 2 View; Future - CBC w/Diff/Platelet  2. Shortness of breath - Moderate expiratory wheezing resolved after breathing treatment - albuterol (PROVENTIL) (2.5 MG/3ML) 0.083% nebulizer solution 2.5 mg - DG Chest 2 View; Future - CBC w/Diff/Platelet - Discussed abx if pneumonia is found in xray  3. Cough - albuterol (PROVENTIL) (2.5 MG/3ML) 0.083% nebulizer solution 2.5 mg - benzonatate (TESSALON) 100 MG capsule; Take 1 capsule (100 mg total) by mouth 2 (two) times daily as needed for cough.  Dispense: 20 capsule; Refill: 0  4. Anemia, unspecified type - discussed return of iron pills if noted to be anemic (fatigue and shob stable) - CBC w/Diff/Platelet  Follow up in 3 days to discuss results and re-assess condition.  -Red flags and when to present for emergency care or RTC including fever >101.64F, chest pain, shortness of breath, new/worsening/un-resolving symptoms, severe fatigue  reviewed with patient at time of visit. Follow up and care instructions discussed and provided in AVS.   I have reviewed this encounter including the documentation in this note and/or discussed this patient  with the provider, Suezanne Cheshire, AGNP-C. I am certifying that I agree with the content of this note as supervising physician.  Suspect possible wandering atrial pacemaker, but EKG unusual and asking for cardiology to read; faxed over yesterday to Dr. Dorice Lamas, MD [place]

## 2017-08-11 ENCOUNTER — Other Ambulatory Visit: Payer: Self-pay | Admitting: *Deleted

## 2017-08-11 ENCOUNTER — Ambulatory Visit (INDEPENDENT_AMBULATORY_CARE_PROVIDER_SITE_OTHER): Payer: Medicare HMO | Admitting: Nurse Practitioner

## 2017-08-11 ENCOUNTER — Encounter: Payer: Self-pay | Admitting: Nurse Practitioner

## 2017-08-11 VITALS — BP 130/82 | HR 105 | Temp 98.5°F

## 2017-08-11 DIAGNOSIS — J449 Chronic obstructive pulmonary disease, unspecified: Secondary | ICD-10-CM | POA: Diagnosis not present

## 2017-08-11 DIAGNOSIS — N184 Chronic kidney disease, stage 4 (severe): Secondary | ICD-10-CM | POA: Diagnosis not present

## 2017-08-11 DIAGNOSIS — R0789 Other chest pain: Secondary | ICD-10-CM | POA: Diagnosis not present

## 2017-08-11 DIAGNOSIS — R296 Repeated falls: Secondary | ICD-10-CM | POA: Diagnosis not present

## 2017-08-11 DIAGNOSIS — R410 Disorientation, unspecified: Secondary | ICD-10-CM | POA: Diagnosis not present

## 2017-08-11 LAB — COMPLETE METABOLIC PANEL WITH GFR
AG Ratio: 1.1 (calc) (ref 1.0–2.5)
ALBUMIN MSPROF: 2.9 g/dL — AB (ref 3.6–5.1)
ALT: 90 U/L — ABNORMAL HIGH (ref 6–29)
AST: 167 U/L — ABNORMAL HIGH (ref 10–35)
Alkaline phosphatase (APISO): 339 U/L — ABNORMAL HIGH (ref 33–130)
BILIRUBIN TOTAL: 4.7 mg/dL — AB (ref 0.2–1.2)
BUN / CREAT RATIO: 7 (calc) (ref 6–22)
BUN: 12 mg/dL (ref 7–25)
CALCIUM: 7.6 mg/dL — AB (ref 8.6–10.4)
CHLORIDE: 102 mmol/L (ref 98–110)
CO2: 27 mmol/L (ref 20–32)
Creat: 1.66 mg/dL — ABNORMAL HIGH (ref 0.60–0.93)
GFR, EST AFRICAN AMERICAN: 34 mL/min/{1.73_m2} — AB (ref >=60)
GFR, EST NON AFRICAN AMERICAN: 29 mL/min/{1.73_m2} — AB (ref >=60)
GLUCOSE: 107 mg/dL — AB (ref 65–99)
Globulin: 2.7 g/dL (calc) (ref 1.9–3.7)
Potassium: 4 mmol/L (ref 3.5–5.3)
Sodium: 137 mmol/L (ref 135–146)
TOTAL PROTEIN: 5.6 g/dL — AB (ref 6.1–8.1)

## 2017-08-11 NOTE — Patient Outreach (Signed)
[facility]) Care Management  08/11/2017  Amy [name] November 28, 1935 [id number]   Telephone screening call #1  Referral received : 3/18 Referral source: PCP practice, Suezanne Cheshire, NP Referral reason : Home assessment, increased falls, husband sole caregiver. Insurance plan : [employer]  Chart reviewed :  Patient history includes : Diabetes, COPD, Alzheimer's,chronic pain syndrome, s/p left AKA,excessive falling , hx renal cancer  Unsuccessful outreach call to patient , able to leave a HIPAA compliant message for return call.  Noted patient has PCP appointment today at 11:20  Will plan return call later today.  [place] call from patient husband ,HIPAA information verified, explained reason for the call.  Conditions  Husband discussed patient frequent falls, and difficulty transferring from wheelchair to and from commode and out of bed, she does not wear prothesis .  Husband recent fall 2/19 fall from bed, no reports injury.  Husband discussed patient problem with Alzheimer's disease  and at times thinks he is her stepfather, and yells for him to get out.  Husband concern patient may have UTI again, awaiting result from recent specimen collected at [place] office this week . Patient has history of Diabetes, he checks her blood sugar about once a week , reading usually less than 170 range. Patient has history of COPD does not always take inhalers as prescribed.   Patient has history of chronic pain, followed at pain center .  Husband voiced concern regarding patient having a visit at dentist with problem he called a gum boil and prescribed an antibiotic.   Social  Patient lives at home with husband as sole caregiver, little support from children. Patient dependant on husband for ADL's, , transfer in and out of bed. He assist patient out of bed to wheelchair. Husband states it is getting harder and harder for him to handle, at times she will not cooperate  with him in getting her to bed. Patient states I wish it was someone you could just call and they be there in a 30 minutes  with transferring and cleaning her up.  At times patient has slept in recliner chair. Husband discussed some difficulty, cleaning up patient when she has a bowel movement, especially in private area, concern regarding preventing infection.   Medications No cost concerns, husband manages medications patient mostly cooperative .   Advanced directive Patient does not have advanced directive  Appointments Patient has been closely followed NP, initial visit with  Dr.Lada in April( previous patient of Dr.Shah)   has follow up appointment again on 3/22. Has 6 month CT follow due to history of cancer per husband.    Consents Explained and offered [facility] care management  services to husband he is agreeable to services.  Patient will benefit community care coordinator as well as [place] .   Plan  RNCM will notify CMA of case status active. RNCM will follow patient in community for home safety evaluations and further assessments of care needs and develop plan of care.  RNCM will place an  order for LCSW consult for assessment of community resources needs available to assist with caregiver support of husband  caring for patient with Alzheimer's as he as expressed it is getting harder for him to handle.  Initial home visit scheduled in the next week.  Provided husband with [facility] contact numbers.    Joylene Draft, RN, [place] Management Coordinator  [phone]- Mobile [phone]- Toll Free Main Office

## 2017-08-11 NOTE — Patient Instructions (Addendum)
  If your/her confusion gets any worse or she gets weaker, complains of a headache or has slurred speech- please go to the emergency room as we discussed.   Please listen to you voice messages when you get home the [facility] nurse has called you to be able to get some more information to see how to best help you both. If you have any issues reaching them you can call [phone]- [place].  We have contact Dr. Acquanetta Sit office to get her urine culture report to make sure the antibiotics started by the dentist will cover her if she has a UTI. We drew some blood to check on other possible causes for your pain but I want you to also follow up with your pain doctor and heart doctor as needed.   Please drink lots of water and take your albuterol inhaler to help with you wheezing.

## 2017-08-11 NOTE — Progress Notes (Addendum)
Name: Amy [name]   MRN: [id number]    DOB: [birth year]   Date:08/11/2017       Progress Note  Subjective  Chief Complaint  Chief Complaint  Patient presents with  . Follow-up    2 day followup  . RUQ    states yesterday it was worse  . Fall    made husband sleep in other bed on monday and the next morning tried to get out by herself and fell    HPI  Patient presents for three day follow-up of right sided chest pain and shob ongoing for 4 days. Patient has dementia and is alert and oriented to self and location and disoriented to husband and she is a poor historian. Pt sts right sided chest pain is the same unrelieved by tylenol, pain sometimes spreads across to center. Nothing makes it better. Pain is worse when she gets irrititated. Patient baseline shob is unchanged she still is not using her inhaler at home- husband states she wont do it because he tells her to.   Husband notes since last visit she has been to dentist and nephrologist and had a urinalysis completed that was negative pending culture; dentist noted pt had tooth abscess and is being treated with clindamycin. Husband also noted patient had another fall when patient kicked husband out of bed due to confusion and in the morning he heard her yelling and found her on the floor. Denies known injuries. He notes in general she has been confused and weak then baseline in the past few days. Coughing still present but improved with cough medicine. Denies fevers, chills, dysuria, vomiting, diarrhea- stools are still soft though.   Patient Active Problem List   Diagnosis Date Noted  . Chronic pain syndrome 04/05/2017  . Depression 11/15/2016  . Iron deficiency anemia 09/08/2016  . Weight loss 07/17/2016  . Anemia 06/29/2016  . Late onset Alzheimer's disease with behavioral disturbance 06/14/2016  . S/P AKA (above knee amputation) unilateral, left ([place]) 06/08/2016  . Wheelchair dependent 03/11/2016  . 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 Need for immunization against influenza 01/29/2015  . S/p nephrectomy 01/29/2015  . Renal neoplasm 01/15/2015  . Pre-operative cardiovascular examination 01/07/2015  . Abnormal EKG 01/02/2015  . Renal mass 12/05/2014  . Acid reflux 11/20/2014  . GAD (generalized anxiety disorder) 11/20/2014  . AB (asthmatic bronchitis) 11/20/2014  . Aortic atherosclerosis ([place]) 11/20/2014  . Cataract 11/20/2014  . Leg pain 11/20/2014  . Continuous opioid dependence ([place]) 11/20/2014  . CKD (chronic kidney disease) stage 4, GFR 15-29 ml/min (HCC) 11/20/2014  . COPD, mild ([place]) 11/20/2014  . Encounter for general adult medical examination without abnormal findings 11/20/2014  . Excessive falling 11/20/2014  . Fatty infiltration of liver 11/20/2014  . BP (high blood pressure) 11/20/2014  . Hyperlipidemia 11/20/2014  . Anterior knee pain 11/20/2014  . LBP (low back pain) 11/20/2014  . Malaise and fatigue 11/20/2014  . OP (osteoporosis) 11/20/2014  . Parity 1 11/20/2014  . Abnormal presence of protein in urine 11/20/2014  . PVD (peripheral vascular disease) ([place]) 11/20/2014  . Allergic rhinitis 11/20/2014  . At risk for falling 11/20/2014  . Phantom limb syndrome with pain ([place]) 11/05/2014  . Anxiety 11/05/2014  . Elevated serum creatinine 11/05/2014  . Diabetes mellitus type 2, controlled ([place]) 11/05/2014  . H/O adenomatous polyp of colon 07/27/2010    Social History   Tobacco Use  . Smoking status: Former Smoker  Packs/day: 1.50    Years: 51.00    Pack years: 76.50    Types: Cigarettes    Last attempt to quit: 05/24/2004    Years since quitting: 13.2  . Smokeless tobacco: Never Used  Substance Use Topics  . Alcohol use: No     Current Outpatient Medications:  .  acetaminophen (TYLENOL) 500 MG tablet, Take 500 mg by mouth every 6 (six) hours as needed. Pt takes 2 tablets as needed., Disp: , Rfl:  .  albuterol (PROVENTIL HFA;VENTOLIN HFA)  108 (90 Base) MCG/ACT inhaler, Inhale 2 puffs into the lungs every 6 (six) hours as needed for wheezing or shortness of breath., Disp: 1 Inhaler, Rfl: 2 .  atorvastatin (LIPITOR) 20 MG tablet, Take 1 tablet (20 mg total) by mouth at bedtime., Disp: 90 tablet, Rfl: 0 .  benzonatate (TESSALON) 100 MG capsule, Take 1 capsule (100 mg total) by mouth 2 (two) times daily as needed for cough., Disp: 20 capsule, Rfl: 0 .  calcitRIOL (ROCALTROL) 0.25 MCG capsule, Take 0.25 mcg by mouth 3 (three) times a week., Disp: , Rfl:  .  citalopram (CELEXA) 20 MG tablet, TAKE 1 TABLET (20 MG TOTAL) BY MOUTH AT BEDTIME., Disp: 90 tablet, Rfl: 0 .  clopidogrel (PLAVIX) 75 MG tablet, Take 1 tablet (75 mg total) by mouth daily., Disp: 90 tablet, Rfl: 3 .  docusate sodium (COLACE) 100 MG capsule, Take 100 mg by mouth daily. Pt taking once a day with breakfast, Disp: , Rfl:  .  donepezil (ARICEPT) 5 MG tablet, Take 1 tablet by mouth at bedtime., Disp: , Rfl:  .  ferrous sulfate 325 (65 FE) MG tablet, Take 1 tablet (325 mg total) by mouth 2 (two) times daily with a meal., Disp: 180 tablet, Rfl: 0 .  gabapentin (NEURONTIN) 300 MG capsule, Take 1 capsule (300 mg total) by mouth 3 (three) times daily., Disp: 270 capsule, Rfl: 3 .  glucose blood test strip, Use as instructed., Disp: 100 each, Rfl: 12 .  HYDROcodone-acetaminophen (NORCO) 7.5-325 MG tablet, Take 1 tablet by mouth 3 (three) times daily as needed., Disp: 90 tablet, Rfl: 0 .  lisinopril (PRINIVIL,ZESTRIL) 5 MG tablet, Take 1 tablet by mouth daily., Disp: , Rfl:  .  metoprolol tartrate (LOPRESSOR) 50 MG tablet, Take 1 tablet (50 mg total) by mouth 2 (two) times daily., Disp: 180 tablet, Rfl: 0 .  Multiple Vitamins-Minerals (HAIR SKIN AND NAILS FORMULA) TABS, Take by mouth. One tablet daily, Disp: , Rfl:  .  pantoprazole (PROTONIX) 40 MG tablet, Take 1 tablet (40 mg total) by mouth daily., Disp: 90 tablet, Rfl: 1 .  QUEtiapine (SEROQUEL) 50 MG tablet, Take 50 mg by mouth  at bedtime. , Disp: , Rfl:  .  ranitidine (ZANTAC) 150 MG capsule, TAKE 1 CAPSULE AT BEDTIME, Disp: 90 capsule, Rfl: 0 .  donepezil (ARICEPT) 5 MG tablet, Take 5 mg by mouth at bedtime., Disp: , Rfl:   Current Facility-Administered Medications:  .  albuterol (PROVENTIL) (2.5 MG/3ML) 0.083% nebulizer solution 2.5 mg, 2.5 mg, Nebulization, Once, Shaunte Tuft, Bethel Born, NP  Allergies  Allergen Reactions  . Fentanyl Other (See Comments)    urine retention  . Penicillins Itching and Rash    ROS   Constitutional: Negative for fever or weight change.  Respiratory: Negative for cough and shortness of breath.   Cardiovascular: Positive for chest pain Negative palpitations.  Gastrointestinal: Negative for abdominal pain, no bowel changes.  Musculoskeletal: Positive for gait problem- wheelchair bound or Negative  joint swelling.  Skin: Negative for rash.  Neurological: Negative for dizziness Positive intermittent headache-relieved with tylenol.  No other specific complaints in a complete review of systems (except as listed in HPI above).  Objective  Vitals:   08/11/17 1125  BP: 130/82  Pulse: (!) 105  Temp: 98.5 F (36.9 C)  TempSrc: Oral  SpO2: 92%    There is no height or weight on file to calculate BMI.  Nursing Note and Vital Signs reviewed.  Physical Exam  Constitutional: Patient appears well-developed and well-nourished. Obese  No distress. Mild increase in confusion, last visit patient was able to recall practitioners name; still able to recall month/year and location. Still disoriented to husband  HEENT: head atraumatic, normocephalic, pupils equal and reactive to light, EOM's intact, neck supple without lymphadenopathy, oropharynx pink and moist without exudate. Partial denture noted, pt has left lower palate tooth pain- with receding gum- per dentist 2 days ago pt has tooth abscess- not obviously visible due to limited ability to open mouth Cardiovascular: mildly elevated  rate, regular rhythm, S1/S2 present.  No murmur or rub heard. No BLE edema. Bilateral radial pulses 3+ Pulmonary/Chest: Effort normal and breath sounds-expiratory wheezing auscultated in bilateral upper lobes. No respiratory distress or retractions. Non-tender chest to deep palpation- sts pain is present now but is worse with agitation.  Abdominal: Soft- obese and non-tender, bowel sounds present x4 quadrants.  No CVA Tenderness  Psychiatric: Patient has a normal mood and affect. behavior is normal. Judgment and thought content normal.  6CIT Screen 08/11/2017 09/13/2016  What Year? 0 points 0 points  What month? 0 points 0 points  What time? 3 points 3 points  Count back from 20 0 points 0 points  Months in reverse 4 points 4 points  Repeat phrase 6 points 4 points  Total Score 13 11     Results for orders placed or performed in visit on 08/08/17 (from the past 72 hour(s))  CBC w/Diff/Platelet     Status: None   Collection Time: 08/08/17  3:13 PM  Result Value Ref Range   WBC 6.3 3.8 - 10.8 Thousand/uL   RBC 4.04 3.80 - 5.10 Million/uL   Hemoglobin 12.1 11.7 - 15.5 g/dL   HCT 36.8 35.0 - 45.0 %   MCV 91.1 80.0 - 100.0 fL   MCH 30.0 27.0 - 33.0 pg   MCHC 32.9 32.0 - 36.0 g/dL   RDW 13.8 11.0 - 15.0 %   Platelets 189 140 - 400 Thousand/uL   MPV 11.3 7.5 - 12.5 fL   Neutro Abs 3,906 1,500 - 7,800 cells/uL   Lymphs Abs 1,827 850 - 3,900 cells/uL   WBC mixed population 365 200 - 950 cells/uL   Eosinophils Absolute 139 15 - 500 cells/uL   Basophils Absolute 63 0 - 200 cells/uL   Neutrophils Relative % 62 %   Total Lymphocyte 29.0 %   Monocytes Relative 5.8 %   Eosinophils Relative 2.2 %   Basophils Relative 1.0 %    Assessment & Plan  1. COPD, mild ([place]) - Please take your albuterol inhaler at night time to help with your wheezing - Follow up with pulmonology   2. Excessive falling - Please call back [facility] to discuss safety options - Discussed safe transfer from wheel  chair  3. Other chest pain Negative chest x-ray, no gallbladder, CT chest ordered by Oncology  - Follow up with pain clinic, cards and psychiatry   4. Confusion -  Discussed ER transfer;  utilized shared decision-making and will follow-up closely with strict ER precautions - Pending UA culture results from Dr. Juleen China - message left  5. CKD (chronic kidney disease) stage 4, GFR 15-29 ml/min (HCC) - Continue follow-up with Dr. Rolly Salter - COMPLETE METABOLIC PANEL WITH GFR   -Red flags and when to present for emergency care or RTC including fever >101.55F, chest pain, shortness of breath, new/worsening/un-resolving symptoms, worsening confusion, slurred speech, unilateral weakness reviewed with patient at time of visit. Follow up and care instructions discussed and provided in AVS.  I have reviewed this encounter including the documentation in this note and/or discussed this patient with the provider, Suezanne Cheshire DNP AGNP-C. I am certifying that I agree with the content of this note as supervising physician. Enid Derry, [facility] 08/15/2017, 4:59 PM

## 2017-08-12 ENCOUNTER — Encounter: Payer: Self-pay | Admitting: Nurse Practitioner

## 2017-08-12 ENCOUNTER — Other Ambulatory Visit: Payer: Self-pay

## 2017-08-12 ENCOUNTER — Emergency Department: Payer: Medicare HMO

## 2017-08-12 ENCOUNTER — Encounter: Payer: Self-pay | Admitting: Emergency Medicine

## 2017-08-12 ENCOUNTER — Ambulatory Visit (INDEPENDENT_AMBULATORY_CARE_PROVIDER_SITE_OTHER): Payer: Medicare HMO | Admitting: Nurse Practitioner

## 2017-08-12 ENCOUNTER — Inpatient Hospital Stay
Admission: EM | Admit: 2017-08-12 | Discharge: 2017-08-15 | DRG: 445 | Disposition: A | Discharge status: Home / Self Care | Payer: Medicare HMO | Attending: Internal Medicine | Admitting: Internal Medicine

## 2017-08-12 VITALS — BP 98/62 | HR 66 | Temp 98.4°F | Resp 16

## 2017-08-12 DIAGNOSIS — E669 Obesity, unspecified: Secondary | ICD-10-CM | POA: Diagnosis present

## 2017-08-12 DIAGNOSIS — Z85528 Personal history of other malignant neoplasm of kidney: Secondary | ICD-10-CM

## 2017-08-12 DIAGNOSIS — K219 Gastro-esophageal reflux disease without esophagitis: Secondary | ICD-10-CM | POA: Diagnosis not present

## 2017-08-12 DIAGNOSIS — N390 Urinary tract infection, site not specified: Secondary | ICD-10-CM | POA: Diagnosis not present

## 2017-08-12 DIAGNOSIS — Z79899 Other long term (current) drug therapy: Secondary | ICD-10-CM

## 2017-08-12 DIAGNOSIS — Z89612 Acquired absence of left leg above knee: Secondary | ICD-10-CM | POA: Diagnosis not present

## 2017-08-12 DIAGNOSIS — R932 Abnormal findings on diagnostic imaging of liver and biliary tract: Secondary | ICD-10-CM | POA: Diagnosis not present

## 2017-08-12 DIAGNOSIS — F419 Anxiety disorder, unspecified: Secondary | ICD-10-CM | POA: Diagnosis present

## 2017-08-12 DIAGNOSIS — K5730 Diverticulosis of large intestine without perforation or abscess without bleeding: Secondary | ICD-10-CM | POA: Diagnosis not present

## 2017-08-12 DIAGNOSIS — R911 Solitary pulmonary nodule: Secondary | ICD-10-CM | POA: Diagnosis not present

## 2017-08-12 DIAGNOSIS — N184 Chronic kidney disease, stage 4 (severe): Secondary | ICD-10-CM | POA: Diagnosis present

## 2017-08-12 DIAGNOSIS — F329 Major depressive disorder, single episode, unspecified: Secondary | ICD-10-CM | POA: Diagnosis not present

## 2017-08-12 DIAGNOSIS — Z9049 Acquired absence of other specified parts of digestive tract: Secondary | ICD-10-CM | POA: Diagnosis not present

## 2017-08-12 DIAGNOSIS — J9811 Atelectasis: Secondary | ICD-10-CM | POA: Diagnosis not present

## 2017-08-12 DIAGNOSIS — Z87440 Personal history of urinary (tract) infections: Secondary | ICD-10-CM | POA: Diagnosis not present

## 2017-08-12 DIAGNOSIS — R1011 Right upper quadrant pain: Secondary | ICD-10-CM

## 2017-08-12 DIAGNOSIS — R319 Hematuria, unspecified: Secondary | ICD-10-CM

## 2017-08-12 DIAGNOSIS — E1122 Type 2 diabetes mellitus with diabetic chronic kidney disease: Secondary | ICD-10-CM | POA: Diagnosis present

## 2017-08-12 DIAGNOSIS — R748 Abnormal levels of other serum enzymes: Secondary | ICD-10-CM

## 2017-08-12 DIAGNOSIS — J439 Emphysema, unspecified: Secondary | ICD-10-CM | POA: Diagnosis not present

## 2017-08-12 DIAGNOSIS — F0390 Unspecified dementia without behavioral disturbance: Secondary | ICD-10-CM | POA: Diagnosis present

## 2017-08-12 DIAGNOSIS — E1151 Type 2 diabetes mellitus with diabetic peripheral angiopathy without gangrene: Secondary | ICD-10-CM | POA: Diagnosis present

## 2017-08-12 DIAGNOSIS — Z86718 Personal history of other venous thrombosis and embolism: Secondary | ICD-10-CM | POA: Diagnosis not present

## 2017-08-12 DIAGNOSIS — Z87891 Personal history of nicotine dependence: Secondary | ICD-10-CM

## 2017-08-12 DIAGNOSIS — E785 Hyperlipidemia, unspecified: Secondary | ICD-10-CM | POA: Diagnosis present

## 2017-08-12 DIAGNOSIS — E1136 Type 2 diabetes mellitus with diabetic cataract: Secondary | ICD-10-CM | POA: Diagnosis present

## 2017-08-12 DIAGNOSIS — I129 Hypertensive chronic kidney disease with stage 1 through stage 4 chronic kidney disease, or unspecified chronic kidney disease: Secondary | ICD-10-CM | POA: Diagnosis not present

## 2017-08-12 DIAGNOSIS — J449 Chronic obstructive pulmonary disease, unspecified: Secondary | ICD-10-CM | POA: Diagnosis not present

## 2017-08-12 DIAGNOSIS — K838 Other specified diseases of biliary tract: Secondary | ICD-10-CM | POA: Diagnosis not present

## 2017-08-12 DIAGNOSIS — Z6833 Body mass index (BMI) 33.0-33.9, adult: Secondary | ICD-10-CM

## 2017-08-12 DIAGNOSIS — K8050 Calculus of bile duct without cholangitis or cholecystitis without obstruction: Principal | ICD-10-CM | POA: Diagnosis present

## 2017-08-12 DIAGNOSIS — I10 Essential (primary) hypertension: Secondary | ICD-10-CM | POA: Diagnosis not present

## 2017-08-12 DIAGNOSIS — B9620 Unspecified Escherichia coli [E. coli] as the cause of diseases classified elsewhere: Secondary | ICD-10-CM | POA: Diagnosis present

## 2017-08-12 DIAGNOSIS — Z7902 Long term (current) use of antithrombotics/antiplatelets: Secondary | ICD-10-CM

## 2017-08-12 LAB — COMPREHENSIVE METABOLIC PANEL
ALBUMIN: 2.7 g/dL — AB (ref 3.5–5.0)
ALK PHOS: 372 U/L — AB (ref 38–126)
ALT: 90 U/L — ABNORMAL HIGH (ref 14–54)
AST: 166 U/L — AB (ref 15–41)
Anion gap: 11 (ref 5–15)
BUN: 13 mg/dL (ref 6–20)
CO2: 21 mmol/L — AB (ref 22–32)
Calcium: 7.7 mg/dL — ABNORMAL LOW (ref 8.9–10.3)
Chloride: 105 mmol/L (ref 101–111)
Creatinine, Ser: 1.86 mg/dL — ABNORMAL HIGH (ref 0.44–1.00)
GFR calc Af Amer: 29 mL/min — ABNORMAL LOW (ref >60)
GFR calc non Af Amer: 25 mL/min — ABNORMAL LOW (ref >60)
GLUCOSE: 206 mg/dL — AB (ref 65–99)
POTASSIUM: 3.5 mmol/L (ref 3.5–5.1)
Sodium: 137 mmol/L (ref 135–145)
Total Bilirubin: 2.9 mg/dL — ABNORMAL HIGH (ref 0.3–1.2)
Total Protein: 6.5 g/dL (ref 6.5–8.1)

## 2017-08-12 LAB — URINALYSIS, COMPLETE (UACMP) WITH MICROSCOPIC
Bilirubin Urine: NEGATIVE
Glucose, UA: NEGATIVE mg/dL
Ketones, ur: NEGATIVE mg/dL
Nitrite: NEGATIVE
PROTEIN: 30 mg/dL — AB
SPECIFIC GRAVITY, URINE: 1.015 (ref 1.005–1.030)
pH: 6 (ref 5.0–8.0)

## 2017-08-12 LAB — CBC
HEMATOCRIT: 36.8 % (ref 35.0–47.0)
Hemoglobin: 12 g/dL (ref 12.0–16.0)
MCH: 29.7 pg (ref 26.0–34.0)
MCHC: 32.5 g/dL (ref 32.0–36.0)
MCV: 91.4 fL (ref 80.0–100.0)
Platelets: 175 10*3/uL (ref 150–440)
RBC: 4.03 MIL/uL (ref 3.80–5.20)
RDW: 16.5 % — AB (ref 11.5–14.5)
WBC: 7.6 10*3/uL (ref 3.6–11.0)

## 2017-08-12 LAB — LIPASE, BLOOD: Lipase: 50 U/L (ref 11–51)

## 2017-08-12 MED ORDER — LEVOFLOXACIN IN D5W 500 MG/100ML IV SOLN
500.0000 mg | Freq: Once | INTRAVENOUS | Status: AC
  Administered 2017-08-12: 500 mg via INTRAVENOUS
  Filled 2017-08-12: qty 100

## 2017-08-12 MED ORDER — IOPAMIDOL (ISOVUE-300) INJECTION 61%
15.0000 mL | INTRAVENOUS | Status: AC
  Administered 2017-08-12 (×2): 15 mL via ORAL

## 2017-08-12 MED ORDER — ALBUTEROL SULFATE (2.5 MG/3ML) 0.083% IN NEBU: 2.5000 mg | INHALATION_SOLUTION | Freq: Once | RESPIRATORY_TRACT | Status: DC

## 2017-08-12 NOTE — Progress Notes (Addendum)
Name: Amy [name]   MRN: [id number]    DOB: [birth year]   Date:08/12/2017       Progress Note  Subjective  Chief Complaint  Chief Complaint  Patient presents with  . Follow-up    1 day    HPI   Patient returned today to discuss RUQ pain, shob, fatigue, confusion, and CMP results. Pt and family declined ER transport and follow-up yesterday. No improvement noted with with symptoms. Confusion same- shortness of breath has worsened- Patient apologetic and tearful sts she forgot to use her inhaler last night.  Husband endorses decreased appetite- patient sts due to pain with eating due to mouth sore- has followed up with dentist who rx abx and made appointment in April for abscessed tooth removal. Husband notes increase in abdominal size despite decreased appetite.    Patient Active Problem List   Diagnosis Date Noted  . Chronic pain syndrome 04/05/2017  . Depression 11/15/2016  . Iron deficiency anemia 09/08/2016  . Weight loss 07/17/2016  . Anemia 06/29/2016  . Late onset Alzheimer's disease with behavioral disturbance 06/14/2016  . S/P AKA (above knee amputation) unilateral, left ([place]) 06/08/2016  . Wheelchair dependent 03/11/2016  . 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 ([place]) 03/13/2015  . Need for immunization against influenza 01/29/2015  . S/p nephrectomy 01/29/2015  . Renal neoplasm 01/15/2015  . Pre-operative cardiovascular examination 01/07/2015  . Abnormal EKG 01/02/2015  . Renal mass 12/05/2014  . Acid reflux 11/20/2014  . GAD (generalized anxiety disorder) 11/20/2014  . AB (asthmatic bronchitis) 11/20/2014  . Aortic atherosclerosis ([place]) 11/20/2014  . Cataract 11/20/2014  . Leg pain 11/20/2014  . Continuous opioid dependence ([place]) 11/20/2014  . CKD (chronic kidney disease) stage 4, GFR 15-29 ml/min (HCC) 11/20/2014  . COPD, mild ([place]) 11/20/2014  . Encounter for general adult medical examination without abnormal findings 11/20/2014  . Excessive  falling 11/20/2014  . Fatty infiltration of liver 11/20/2014  . BP (high blood pressure) 11/20/2014  . Hyperlipidemia 11/20/2014  . Anterior knee pain 11/20/2014  . LBP (low back pain) 11/20/2014  . Malaise and fatigue 11/20/2014  . OP (osteoporosis) 11/20/2014  . Parity 1 11/20/2014  . Abnormal presence of protein in urine 11/20/2014  . PVD (peripheral vascular disease) ([place]) 11/20/2014  . Allergic rhinitis 11/20/2014  . At risk for falling 11/20/2014  . Phantom limb syndrome with pain ([place]) 11/05/2014  . Anxiety 11/05/2014  . Elevated serum creatinine 11/05/2014  . Diabetes mellitus type 2, controlled ([place]) 11/05/2014  . H/O adenomatous polyp of colon 07/27/2010    Social History   Tobacco Use  . Smoking status: Former Smoker    Packs/day: 1.50    Years: 51.00    Pack years: 76.50    Types: Cigarettes    Last attempt to quit: 05/24/2004    Years since quitting: 13.2  . Smokeless tobacco: Never Used  Substance Use Topics  . Alcohol use: No     Current Outpatient Medications:  .  acetaminophen (TYLENOL) 500 MG tablet, Take 500 mg by mouth every 6 (six) hours as needed. Pt takes 2 tablets as needed., Disp: , Rfl:  .  albuterol (PROVENTIL HFA;VENTOLIN HFA) 108 (90 Base) MCG/ACT inhaler, Inhale 2 puffs into the lungs every 6 (six) hours as needed for wheezing or shortness of breath., Disp: 1 Inhaler, Rfl: 2 .  atorvastatin (LIPITOR) 20 MG tablet, Take 1 tablet (20 mg total) by mouth at bedtime., Disp: 90 tablet, Rfl: 0 .  benzonatate (TESSALON) 100  MG capsule, Take 1 capsule (100 mg total) by mouth 2 (two) times daily as needed for cough., Disp: 20 capsule, Rfl: 0 .  calcitRIOL (ROCALTROL) 0.25 MCG capsule, Take 0.25 mcg by mouth 3 (three) times a week., Disp: , Rfl:  .  citalopram (CELEXA) 20 MG tablet, TAKE 1 TABLET (20 MG TOTAL) BY MOUTH AT BEDTIME., Disp: 90 tablet, Rfl: 0 .  clindamycin (CLEOCIN) 300 MG capsule, Take 300 mg by mouth 2 (two) times daily., Disp: , Rfl:  .   clopidogrel (PLAVIX) 75 MG tablet, Take 1 tablet (75 mg total) by mouth daily., Disp: 90 tablet, Rfl: 3 .  docusate sodium (COLACE) 100 MG capsule, Take 100 mg by mouth daily. Pt taking once a day with breakfast, Disp: , Rfl:  .  donepezil (ARICEPT) 5 MG tablet, Take 5 mg by mouth at bedtime., Disp: , Rfl:  .  donepezil (ARICEPT) 5 MG tablet, Take 1 tablet by mouth at bedtime., Disp: , Rfl:  .  ferrous sulfate 325 (65 FE) MG tablet, Take 1 tablet (325 mg total) by mouth 2 (two) times daily with a meal., Disp: 180 tablet, Rfl: 0 .  gabapentin (NEURONTIN) 300 MG capsule, Take 1 capsule (300 mg total) by mouth 3 (three) times daily., Disp: 270 capsule, Rfl: 3 .  glucose blood test strip, Use as instructed., Disp: 100 each, Rfl: 12 .  HYDROcodone-acetaminophen (NORCO) 7.5-325 MG tablet, Take 1 tablet by mouth 3 (three) times daily as needed., Disp: 90 tablet, Rfl: 0 .  lisinopril (PRINIVIL,ZESTRIL) 5 MG tablet, Take 1 tablet by mouth daily., Disp: , Rfl:  .  metoprolol tartrate (LOPRESSOR) 50 MG tablet, Take 1 tablet (50 mg total) by mouth 2 (two) times daily., Disp: 180 tablet, Rfl: 0 .  Multiple Vitamins-Minerals (HAIR SKIN AND NAILS FORMULA) TABS, Take by mouth. One tablet daily, Disp: , Rfl:  .  pantoprazole (PROTONIX) 40 MG tablet, Take 1 tablet (40 mg total) by mouth daily., Disp: 90 tablet, Rfl: 1 .  QUEtiapine (SEROQUEL) 50 MG tablet, Take 50 mg by mouth at bedtime. , Disp: , Rfl:  .  ranitidine (ZANTAC) 150 MG capsule, TAKE 1 CAPSULE AT BEDTIME, Disp: 90 capsule, Rfl: 0  Current Facility-Administered Medications:  .  albuterol (PROVENTIL) (2.5 MG/3ML) 0.083% nebulizer solution 2.5 mg, 2.5 mg, Nebulization, Once, Karlin Binion E, NP .  albuterol (PROVENTIL) (2.5 MG/3ML) 0.083% nebulizer solution 2.5 mg, 2.5 mg, Nebulization, Once, Gisela Lea, Bethel Born, NP  Allergies  Allergen Reactions  . Fentanyl Other (See Comments)    urine retention  . Penicillins Itching and Rash     ROS  Constitutional: Negative for fever. Positive for decreased appetite Respiratory: Negative for cough and shortness of breath.   Cardiovascular: Positive for right chest pain or Denies  palpitations.  Gastrointestinal: Positive for RUQ abdominal pain, no bowel changes-chronicloose stools. Positive for abdominal swelling Musculoskeletal: Negative for gait problem- WC or joint swelling.  Skin: Negative for rash.  Neurological: Negative for dizziness or headache; alert and oriented to self, situation and location- disoriented to husband.  No other specific complaints in a complete review of systems (except as listed in HPI above).  Objective  Vitals:   08/12/17 1024  BP: 98/62  Pulse: 66  Resp: 16  Temp: 98.4 F (36.9 C)  TempSrc: Oral  SpO2: 90%    There is no height or weight on file to calculate BMI.  Nursing Note and Vital Signs reviewed.  Physical Exam  Constitutional: Patient appears well-developed and  well-nourished. Obese, appears uncomfortable in wheelchair   HEENT: head atraumatic, normocephalic, conjunctiva- clear unappreciable yellowing, pupils equal  Cardiovascular: Normal rate, regular rhythm, S1/S2 present.  No murmur or rub heard.  Pulmonary/Chest: Effort normal and breath sounds diminished and expiratory wheezing throughout- post breathing treatment no wheezing auscultated but diminished throughout. No respiratory distress or retractions. Non-tender  Abdominal: Soft and non-tender, distended, bowel sounds present x4 quadrants. Abdomen distended- no fluid waves noted.  Psychiatric: Patient has a normal affect, mood- anxious. behavior is normal. Judgment and thought content normal.  Results for orders placed or performed in visit on 08/11/17 (from the past 72 hour(s))  COMPLETE METABOLIC PANEL WITH GFR     Status: Abnormal   Collection Time: 08/11/17 12:40 PM  Result Value Ref Range   Glucose, Bld 107 (H) 65 - 99 mg/dL    Comment: .            Fasting  reference interval . For someone without known diabetes, a glucose value between 100 and 125 mg/dL is consistent with prediabetes and should be confirmed with a follow-up test. .    BUN 12 7 - 25 mg/dL   Creat 1.66 (H) 0.60 - 0.93 mg/dL    Comment: For patients >55 years of age, the reference limit for Creatinine is approximately 13% higher for people identified as African-American. .    GFR, Est Non African American 29 (L) > OR = 60 mL/min/1.20m2   GFR, Est African American 34 (L) > OR = 60 mL/min/1.57m2   BUN/Creatinine Ratio 7 6 - 22 (calc)   Sodium 137 135 - 146 mmol/L   Potassium 4.0 3.5 - 5.3 mmol/L   Chloride 102 98 - 110 mmol/L   CO2 27 20 - 32 mmol/L   Calcium 7.6 (L) 8.6 - 10.4 mg/dL   Total Protein 5.6 (L) 6.1 - 8.1 g/dL   Albumin 2.9 (L) 3.6 - 5.1 g/dL   Globulin 2.7 1.9 - 3.7 g/dL (calc)   AG Ratio 1.1 1.0 - 2.5 (calc)   Total Bilirubin 4.7 (H) 0.2 - 1.2 mg/dL   Alkaline phosphatase (APISO) 339 (H) 33 - 130 U/L   AST 167 (H) 10 - 35 U/L   ALT 90 (H) 6 - 29 U/L    Assessment & Plan  1. Elevated liver enzymes - Spoke to Dr. Mike Gip recommended ER for extensive work-up of abnormal labs and stat CTs; discussed at length importance of STAT work-up and benefits of ER due to significant increase in the presence of concerning symptoms and neoplasm history. Patient and husband aggreble to go straight to ER. Report Called to triage RN in ER.   2. RUQ pain - ER   3. COPD, mild (HCC) -ER  - albuterol (PROVENTIL) (2.5 MG/3ML) 0.083% nebulizer solution 2.5 mg    I have reviewed this encounter including the documentation in this note and/or discussed this patient with the provider, Suezanne Cheshire DNP AGNP-C. I am certifying that I agree with the content of this note as supervising physician. Enid Derry, [facility] 08/26/2017, 5:27 PM

## 2017-08-12 NOTE — ED Provider Notes (Signed)
[facility] Emergency Department Provider Note   ____________________________________________   First MD Initiated Contact with Patient 08/12/17 1608     (approximate)  I have reviewed the triage vital signs and the nursing notes.   HISTORY  Chief Complaint Abdominal Pain (RUQ)   HPI AVEY MCMANAMON is a 82 y.o. female Who had renal cancer earlier. She was supposed to have a screening test CT of the chest and abdomen. she went to the doctor had blood test done that showed a elevated liver function so she was sent here for the CT to be done earlier in further evaluation. Patient reports the belly pain started Monday 5 days ago. It's in the right upper quadrant. She is not running a fever she vomited twice this morning but not otherwise. She's not been eating says she can't tell me if the pain is worse with eating. She's not had anydiarrhea. Pain is moderate. Patient does not want any pain medicine.   Past Medical History:  Diagnosis Date  . Anxiety   . Bilateral cataracts   . Bronchitis    hx of   . Cancer of kidney, left ([place]) 2017  . Cataract   . Chronic kidney disease   . Depression   . Diabetes mellitus without complication ([place])   . Dry cough   . 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 GERD (gastroesophageal reflux disease)   . History of frequent urinary tract infections   . Hyperlipidemia   . Hypertension   . Left renal mass   . Numbness and tingling    right hand   . Obesity   . Peripheral artery disease ([place])   . Urinary frequency   . Urinary incontinence     Patient Active Problem List   Diagnosis Date Noted  . Chronic pain syndrome 04/05/2017  . Depression 11/15/2016  . Iron deficiency anemia 09/08/2016  . Weight loss 07/17/2016  . Anemia 06/29/2016  . Late onset Alzheimer's disease with behavioral disturbance 06/14/2016  . S/P AKA (above knee amputation) unilateral, left ([place])  06/08/2016  . Wheelchair dependent 03/11/2016  . 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 ([place] ) 03/13/2015  . Need for immunization against influenza 01/29/2015  . S/p nephrectomy 01/29/2015  . Renal neoplasm 01/15/2015  . Pre-operative cardiovascular examination 01/07/2015  . Abnormal EKG 01/02/2015  . Renal mass 12/05/2014  . Acid reflux 11/20/2014  . GAD (generalized anxiety disorder) 11/20/2014  . AB (asthmatic bronchitis) 11/20/2014  . Aortic atherosclerosis ([place]) 11/20/2014  . Cataract 11/20/2014  . Leg pain 11/20/2014  . Continuous opioid dependence ([place]) 11/20/2014  . CKD (chronic kidney disease) stage 4, GFR 15-29 ml/min (HCC) 11/20/2014  . COPD, mild ([place]) 11/20/2014  . Encounter for general adult medical examination without abnormal findings 11/20/2014  . Excessive falling 11/20/2014  . Fatty infiltration of liver 11/20/2014  . BP (high blood pressure) 11/20/2014  . Hyperlipidemia 11/20/2014  . Anterior knee pain 11/20/2014  . LBP (low back pain) 11/20/2014  . Malaise and fatigue 11/20/2014  . OP (osteoporosis) 11/20/2014  . Parity 1 11/20/2014  . Abnormal presence of protein in urine 11/20/2014  . PVD (peripheral vascular disease) ([place]) 11/20/2014  . Allergic rhinitis 11/20/2014  . At risk for falling 11/20/2014  . Phantom limb syndrome with pain ([place]) 11/05/2014  . Anxiety 11/05/2014  . Elevated serum creatinine 11/05/2014  . Diabetes mellitus type 2, controlled ([place]) 11/05/2014  .  H/O adenomatous polyp of colon 07/27/2010    Past Surgical History:  Procedure Laterality Date  . ABDOMINAL HYSTERECTOMY  1978  . CARPAL TUNNEL RELEASE Bilateral   . CHOLECYSTECTOMY    . LEG AMPUTATION     above the knee / left   . ROBOT ASSISTED LAPAROSCOPIC NEPHRECTOMY Left 01/15/2015   Procedure: ROBOTIC ASSISTED LAPAROSCOPIC RADICAL NEPHRECTOMY;  Surgeon: Alexis Frock, MD;  Location: [facility] ORS;  Service: Urology;  Laterality: Left;  . stent placement right leg        Prior to Admission medications   Medication Sig Start Date End Date Taking? Authorizing Provider  acetaminophen (TYLENOL) 500 MG tablet Take 500 mg by mouth every 6 (six) hours as needed. Pt takes 2 tablets as needed.    [provider]  albuterol (PROVENTIL HFA;VENTOLIN HFA) 108 (90 Base) MCG/ACT inhaler Inhale 2 puffs into the lungs every 6 (six) hours as needed for wheezing or shortness of breath. 06/15/17   Roselee Nova, MD  atorvastatin (LIPITOR) 20 MG tablet Take 1 tablet (20 mg total) by mouth at bedtime. 06/27/17   Roselee Nova, MD  benzonatate (TESSALON) 100 MG capsule Take 1 capsule (100 mg total) by mouth 2 (two) times daily as needed for cough. 08/08/17   Poulose, Bethel Born, NP  calcitRIOL (ROCALTROL) 0.25 MCG capsule Take 0.25 mcg by mouth 3 (three) times a week. 08/01/17   [provider]  citalopram (CELEXA) 20 MG tablet TAKE 1 TABLET (20 MG TOTAL) BY MOUTH AT BEDTIME. 05/13/17   Roselee Nova, MD  clindamycin (CLEOCIN) 300 MG capsule Take 300 mg by mouth 2 (two) times daily. 08/10/17 08/17/17  [provider]  clopidogrel (PLAVIX) 75 MG tablet Take 1 tablet (75 mg total) by mouth daily. 07/28/17   Algernon Huxley, MD  docusate sodium (COLACE) 100 MG capsule Take 100 mg by mouth daily. Pt taking once a day with breakfast    [provider]  donepezil (ARICEPT) 5 MG tablet Take 5 mg by mouth at bedtime. 06/28/16 03/15/17  [provider]  donepezil (ARICEPT) 5 MG tablet Take 1 tablet by mouth at bedtime. 04/19/17   [provider]  ferrous sulfate 325 (65 FE) MG tablet Take 1 tablet (325 mg total) by mouth 2 (two) times daily with a meal. 08/20/16   Roselee Nova, MD  gabapentin (NEURONTIN) 300 MG capsule Take 1 capsule (300 mg total) by mouth 3 (three) times daily. 04/26/17   Gillis Santa, MD  glucose blood test strip Use as instructed. 03/11/16   Roselee Nova, MD  HYDROcodone-acetaminophen ([place]) 7.5-325 MG tablet  Take 1 tablet by mouth 3 (three) times daily as needed. 06/21/17   Gillis Santa, MD  lisinopril (PRINIVIL,ZESTRIL) 5 MG tablet Take 1 tablet by mouth daily. 02/03/16   [provider]  metoprolol tartrate (LOPRESSOR) 50 MG tablet Take 1 tablet (50 mg total) by mouth 2 (two) times daily. 06/15/17   Roselee Nova, MD  Multiple Vitamins-Minerals (HAIR SKIN AND NAILS FORMULA) TABS Take by mouth. One tablet daily    [provider]  pantoprazole (PROTONIX) 40 MG tablet Take 1 tablet (40 mg total) by mouth daily. 04/22/17   Roselee Nova, MD  QUEtiapine (SEROQUEL) 50 MG tablet Take 50 mg by mouth at bedtime.  06/14/16   [provider]  ranitidine (ZANTAC) 150 MG capsule TAKE 1 CAPSULE AT BEDTIME 06/22/17   Keith Rake Asad A,  MD    Allergies Fentanyl and Penicillins  Family History  Problem Relation Age of Onset  . Cancer Mother        Stomach  . Cancer Father   . Diabetes Brother   . Cancer Brother     Social History Social History   Tobacco Use  . Smoking status: Former Smoker    Packs/day: 1.50    Years: 51.00    Pack years: 76.50    Types: Cigarettes    Last attempt to quit: 05/24/2004    Years since quitting: 13.2  . Smokeless tobacco: Never Used  Substance Use Topics  . Alcohol use: No  . Drug use: No    Review of Systems  Constitutional: No fever/chills Eyes: No visual changes. ENT: No sore throat. Cardiovascular: Denies chest pain. Respiratory: Denies shortness of breath. Gastrointestinal: see history of present illness Genitourinary: Negative for dysuria. Musculoskeletal: Negative for back pain. Skin: Negative for rash. Neurological: Negative for headaches, focal weaknes ____________________________________________   PHYSICAL EXAM:  VITAL SIGNS: ED Triage Vitals  Enc Vitals Group     BP 08/12/17 1156 (!) 92/47     Pulse --      Resp 08/12/17 1156 20     Temp 08/12/17 1156 98.2 F (36.8 C)     Temp Source 08/12/17 1156 Oral      SpO2 --      Weight 08/12/17 1156 154 lb (69.9 kg)     Height 08/12/17 1156 5\' 2"  (1.575 m)     Head Circumference --      Peak Flow --      Pain Score 08/12/17 1218 7     Pain Loc --      Pain Edu? --      Excl. in [place]? --     Constitutional: Alert and oriented. Well appearing and in no acute distress. Eyes: Conjunctivae are normal.  Head: Atraumatic. Nose: No congestion/rhinnorhea. Mouth/Throat: Mucous membranes are moist.  Oropharynx non-erythematous. Neck: No stridor. Cardiovascular: Normal rate, regular rhythm. Grossly normal heart sounds.  Good peripheral circulation. Respiratory: Normal respiratory effort.  No retractions. Lungs CTAB. Gastrointestinal: Soft tender palpation percussion right upper quadrant No distention. No abdominal bruits. No CVA tenderness. Musculoskeletal: No lower extremity tenderness slight trace edema.  No joint effusions. Neurologic:  Normal speech and language. No gross focal neurologic deficits are appreciated.  Skin:  Skin is warm, dry and intact. No rash noted. Psychiatric: Mood and affect are normal. Speech and behavior are normal.  ____________________________________________   LABS (all labs ordered are listed, but only abnormal results are displayed)  Labs Reviewed  COMPREHENSIVE METABOLIC PANEL - Abnormal; Notable for the following components:      Result Value   CO2 21 (*)    Glucose, Bld 206 (*)    Creatinine, Ser 1.86 (*)    Calcium 7.7 (*)    Albumin 2.7 (*)    AST 166 (*)    ALT 90 (*)    Alkaline Phosphatase 372 (*)    Total Bilirubin 2.9 (*)    GFR calc non Af Amer 25 (*)    GFR calc Af Amer 29 (*)    All other components within normal limits  CBC - Abnormal; Notable for the following components:   RDW 16.5 (*)    All other components within normal limits  URINALYSIS, COMPLETE (UACMP) WITH MICROSCOPIC - Abnormal; Notable for the following components:   Color, Urine AMBER (*)    APPearance HAZY (*)  Hgb urine  dipstick SMALL (*)    Protein, ur 30 (*)    Leukocytes, UA LARGE (*)    Bacteria, UA RARE (*)    Squamous Epithelial / LPF 0-5 (*)    All other components within normal limits  CULTURE, BLOOD (ROUTINE X 2)  CULTURE, BLOOD (ROUTINE X 2)  LIPASE, BLOOD   ____________________________________________  EKG  EKG read and interpreted by me shows normal sinus rhythm rate of 100 normal axis nonspecific ST-T wave changes ____________________________________________  RADIOLOGY  ED MD interpretation: CT of the chest shows no active disease CT of the abdomen shows what appears to be choledocholithiasis please see the radiologist's report I reviewed the CT myself but do not feel competent 2 interpreted son depending on the CT report by the radiologist   Official radiology report(s): Ct Abdomen Pelvis Wo Contrast  Result Date: 08/12/2017 CLINICAL DATA:  Right upper quadrant pain since Monday morning. Left oophorectomy a few years ago. Nausea and vomiting with decreased appetite. EXAM: CT CHEST, ABDOMEN AND PELVIS WITHOUT CONTRAST TECHNIQUE: Multidetector CT imaging of the chest, abdomen and pelvis was performed following the standard protocol without IV contrast. COMPARISON:  Chest CT 02/16/2017 FINDINGS: CT CHEST FINDINGS Cardiovascular: Heart size is within normal limits with trace pericardial effusion anteriorly. Left main and three-vessel coronary arteriosclerosis is noted. Unremarkable noncontrast appearance of the pulmonary vasculature. Mediastinum/Nodes: Normal thyroid gland. Normal branch pattern of the great vessels with moderate atherosclerosis at the origins of the great vessels. No aortic aneurysm. There is mild-to-moderate atherosclerosis along the arch and descending portion. Trachea is midline. Patent mainstem bronchi. Esophagus is unremarkable. Lungs/Pleura: Minimal apical scarring on the right. Pleural-based nodular opacity in the anterior right upper lobe measuring 7.2 x 4 mm versus 6.6 x  4.9 mm previously is noted. Stable 4 mm opacity in the medial left upper lobe is also unchanged. Mild centrilobular emphysema without acute pneumonic consolidation, effusion or pneumothorax. Bibasilar dependent atelectasis. Mild peribronchial thickening bilaterally. Subsegmental atelectasis and/or scarring is seen the left lower lobe and lingula. No effusion or pneumothorax. Musculoskeletal: No chest wall mass or suspicious bone lesions identified. CT ABDOMEN PELVIS FINDINGS Hepatobiliary: No focal liver abnormality is seen. Status post cholecystectomy. Dilatation of the common bowel duct to 17 mm is identified, previously 10 mm with a subtle hyperdensity measuring 9 mm near the ampulla, previously 6 mm. Although findings could simply represent partial volume averaging of intraluminal contrast within the duodenum, given interval increase in dilatation of the common bowel duct since prior exam and patient's right upper quadrant pain, the possibility of choledocholithiasis is raised. Pancreas: Normal without pancreatic duct dilatation. Spleen: Normal Adrenals/Urinary Tract: Normal bilateral adrenal glands. Status post left nephrectomy with compensatory hypertrophy of the right kidney. A large exophytic simple cyst is noted off the lateral aspect of the right kidney measuring 8.4 x 8.2 x 8.7 cm. Smaller simple cysts are also noted off the right kidney in addition to a complex appearing hyperdense cyst posteriorly without worrisome appearing features measuring 1.1 cm in diameter. This may represent a proteinaceous or hemorrhagic cyst. Further characterization however is not possible this unenhanced study. Renovascular calcifications are present within the medullary compartment of the right kidney. No obstructive uropathy is seen. No hydroureteronephrosis. The urinary bladder is unremarkable for degree of distention. No calculus or focal mural thickening is noted with respect to the bladder. Stomach/Bowel: Small hiatal  hernia. Contrast filled stomach with normal small bowel rotation. No small bowel obstruction or inflammation. Contrast reaches the colon.  There is colonic diverticulosis without acute diverticulitis. The appendix is not visualized but no pericecal inflammation is noted. Vascular/Lymphatic: Ectasias of the infrarenal abdominal aorta with moderate degree of aortoiliac and branch vessel atherosclerosis. No lymphadenopathy. Reproductive: Status post hysterectomy.  No adnexal mass. Other: No free air nor free fluid. Ventral supraumbilical fat containing hernia. Musculoskeletal: Moderate burst fracture involving the superior endplate of L1 with 3 mm retropulsion noted contributing to mild central canal stenosis. IMPRESSION: Chest CT: 1. Left main and three-vessel coronary arteriosclerosis. 2. No active pulmonary disease. Mild centrilobular emphysema. Stable appearing pleural-based nodular opacity in the anterior aspect of the right upper lobe measuring 5.6 mm in average. Stable 4 mm medial left upper lobe pleural-based opacity is unchanged. 3. Aortic atherosclerosis. Abdomen and pelvic CT: 1. 9 mm hyperdensity along the expected location of the distal common bile duct near the ampulla with dilatation of the CBD to 17 mm, both increased in appearance since prior. Although findings may simply represent imaging of intraluminal contrast, given patient's right upper quadrant pain and interval increase in CBD caliber, choledocholithiasis is raised. The patient is status post cholecystectomy. 2. Status post left nephrectomy. Simple renal cysts of the right kidney are noted with 1 hyperdense complex appearing lesion possibly representing a hemorrhagic or proteinaceous cyst measuring approximate 1.1 cm. No worrisome features are identified with respect of this complex lesion. 3. Colonic diverticulosis without acute diverticulitis. No bowel obstruction. 4. Remote appearing burst fracture involving the superior endplate of L1 with 3  mm of retropulsion causing slight central canal stenosis. Electronically Signed   By: Ashley Royalty M.D.   On: 08/12/2017 19:25   Ct Chest Wo Contrast  Result Date: 08/12/2017 CLINICAL DATA:  Right upper quadrant pain since Monday morning. Left oophorectomy a few years ago. Nausea and vomiting with decreased appetite. EXAM: CT CHEST, ABDOMEN AND PELVIS WITHOUT CONTRAST TECHNIQUE: Multidetector CT imaging of the chest, abdomen and pelvis was performed following the standard protocol without IV contrast. COMPARISON:  Chest CT 02/16/2017 FINDINGS: CT CHEST FINDINGS Cardiovascular: Heart size is within normal limits with trace pericardial effusion anteriorly. Left main and three-vessel coronary arteriosclerosis is noted. Unremarkable noncontrast appearance of the pulmonary vasculature. Mediastinum/Nodes: Normal thyroid gland. Normal branch pattern of the great vessels with moderate atherosclerosis at the origins of the great vessels. No aortic aneurysm. There is mild-to-moderate atherosclerosis along the arch and descending portion. Trachea is midline. Patent mainstem bronchi. Esophagus is unremarkable. Lungs/Pleura: Minimal apical scarring on the right. Pleural-based nodular opacity in the anterior right upper lobe measuring 7.2 x 4 mm versus 6.6 x 4.9 mm previously is noted. Stable 4 mm opacity in the medial left upper lobe is also unchanged. Mild centrilobular emphysema without acute pneumonic consolidation, effusion or pneumothorax. Bibasilar dependent atelectasis. Mild peribronchial thickening bilaterally. Subsegmental atelectasis and/or scarring is seen the left lower lobe and lingula. No effusion or pneumothorax. Musculoskeletal: No chest wall mass or suspicious bone lesions identified. CT ABDOMEN PELVIS FINDINGS Hepatobiliary: No focal liver abnormality is seen. Status post cholecystectomy. Dilatation of the common bowel duct to 17 mm is identified, previously 10 mm with a subtle hyperdensity measuring 9 mm  near the ampulla, previously 6 mm. Although findings could simply represent partial volume averaging of intraluminal contrast within the duodenum, given interval increase in dilatation of the common bowel duct since prior exam and patient's right upper quadrant pain, the possibility of choledocholithiasis is raised. Pancreas: Normal without pancreatic duct dilatation. Spleen: Normal Adrenals/Urinary Tract: Normal bilateral adrenal glands. Status post  left nephrectomy with compensatory hypertrophy of the right kidney. A large exophytic simple cyst is noted off the lateral aspect of the right kidney measuring 8.4 x 8.2 x 8.7 cm. Smaller simple cysts are also noted off the right kidney in addition to a complex appearing hyperdense cyst posteriorly without worrisome appearing features measuring 1.1 cm in diameter. This may represent a proteinaceous or hemorrhagic cyst. Further characterization however is not possible this unenhanced study. Renovascular calcifications are present within the medullary compartment of the right kidney. No obstructive uropathy is seen. No hydroureteronephrosis. The urinary bladder is unremarkable for degree of distention. No calculus or focal mural thickening is noted with respect to the bladder. Stomach/Bowel: Small hiatal hernia. Contrast filled stomach with normal small bowel rotation. No small bowel obstruction or inflammation. Contrast reaches the colon. There is colonic diverticulosis without acute diverticulitis. The appendix is not visualized but no pericecal inflammation is noted. Vascular/Lymphatic: Ectasias of the infrarenal abdominal aorta with moderate degree of aortoiliac and branch vessel atherosclerosis. No lymphadenopathy. Reproductive: Status post hysterectomy.  No adnexal mass. Other: No free air nor free fluid. Ventral supraumbilical fat containing hernia. Musculoskeletal: Moderate burst fracture involving the superior endplate of L1 with 3 mm retropulsion noted  contributing to mild central canal stenosis. IMPRESSION: Chest CT: 1. Left main and three-vessel coronary arteriosclerosis. 2. No active pulmonary disease. Mild centrilobular emphysema. Stable appearing pleural-based nodular opacity in the anterior aspect of the right upper lobe measuring 5.6 mm in average. Stable 4 mm medial left upper lobe pleural-based opacity is unchanged. 3. Aortic atherosclerosis. Abdomen and pelvic CT: 1. 9 mm hyperdensity along the expected location of the distal common bile duct near the ampulla with dilatation of the CBD to 17 mm, both increased in appearance since prior. Although findings may simply represent imaging of intraluminal contrast, given patient's right upper quadrant pain and interval increase in CBD caliber, choledocholithiasis is raised. The patient is status post cholecystectomy. 2. Status post left nephrectomy. Simple renal cysts of the right kidney are noted with 1 hyperdense complex appearing lesion possibly representing a hemorrhagic or proteinaceous cyst measuring approximate 1.1 cm. No worrisome features are identified with respect of this complex lesion. 3. Colonic diverticulosis without acute diverticulitis. No bowel obstruction. 4. Remote appearing burst fracture involving the superior endplate of L1 with 3 mm of retropulsion causing slight central canal stenosis. Electronically Signed   By: Ashley Royalty M.D.   On: 08/12/2017 19:25    ____________________________________________   PROCEDURES  Proc  Procedures  Critical Care performed: critical care time one and a half hours. This includes evaluating the patient discussing the patient with our hospitalist are GI doctor in the GI doctor to [facility] and then the GI doctor at [facility]. I also had to talk to the patient and her husband a lot in an effort to convince them not to leave AMA.  ____________________________________________   INITIAL IMPRESSION / ASSESSMENT AND PLAN / ED COURSE  CT appears to  show choledocholithiasis we do not have  any gastroenterologists here who can do ERCP. I called [facility] and spoke with the gastroenterologist there and read him the CT report he wants me to call our gastroenterologist and have our gastroenterologists come in and see the patient and call him. So I called our gastroenterologist and spoke with her and she reviewed everything and called him. He wants the patient to be admitted here and transferred to [facility] tomorrow for the ERCP and then sent back. Back here  that is. Our hospitalist is a little reluctant to do this. The patient's husband wants to go home because he is 76 years old and is "all worn out". Issue the patient's husband wants to take her to whenever she has to go because he does not appear to the ambulance. Acute discussing the patient with the gastroenterologist at [facility] twice in our gastroenterologist twice in the hospitalist twice I will see if I can get her to go to [facility]. ----------------------------------------- 10:28 PM on 08/12/2017 -----------------------------------------  [facility] and [facility] bboth have multi-day weights for beds for medical patients. We will admit the patient here and they will go to [facility] soon as they can or [facility] can do the same thing - do the ERCP Monday and have her come back here.        ____________________________________________   FINAL CLINICAL IMPRESSION(S) / ED DIAGNOSES  Final diagnoses:  Right upper quadrant abdominal pain  Choledocholithiasis  Urinary tract infection with hematuria, site unspecified     ED Discharge Orders    None       Note:  This document was prepared using Dragon voice recognition software and may include unintentional dictation errors.    Nena Polio, MD 08/12/17 2229

## 2017-08-12 NOTE — Patient Instructions (Signed)
Please go straight to the ER- Your liver enzymes are significantly elevated and I spoke to you cancer doctor and we recommend you go there to get further evaluated for your Right  Upper abdominal pain, fatigue, and decreased appetite.

## 2017-08-12 NOTE — Discharge Instructions (Addendum)
Low fat diet  DO NOT TAKE PLAVIX TILL YOU GET YOUR ERCP PROCEDURE

## 2017-08-12 NOTE — ED Triage Notes (Signed)
Pt arrived via POV from home, supposed to have radiology tests procedures on 3/26. Family states blood work completed, enzymes on the liver were elevated. Pt was instructed to come to ED for evaluation.  Pt has left nephrectomy a few years ago. Pt has been having RUQ pain since Monday morning.  Pt has had some nausea and vomiting, pt has had a decreased appetite.

## 2017-08-13 ENCOUNTER — Other Ambulatory Visit: Payer: Self-pay

## 2017-08-13 DIAGNOSIS — F0390 Unspecified dementia without behavioral disturbance: Secondary | ICD-10-CM | POA: Diagnosis present

## 2017-08-13 DIAGNOSIS — Z86718 Personal history of other venous thrombosis and embolism: Secondary | ICD-10-CM | POA: Diagnosis not present

## 2017-08-13 DIAGNOSIS — Z9049 Acquired absence of other specified parts of digestive tract: Secondary | ICD-10-CM | POA: Diagnosis not present

## 2017-08-13 DIAGNOSIS — K8050 Calculus of bile duct without cholangitis or cholecystitis without obstruction: Principal | ICD-10-CM

## 2017-08-13 DIAGNOSIS — Z89612 Acquired absence of left leg above knee: Secondary | ICD-10-CM | POA: Diagnosis not present

## 2017-08-13 DIAGNOSIS — B9620 Unspecified Escherichia coli [E. coli] as the cause of diseases classified elsewhere: Secondary | ICD-10-CM | POA: Diagnosis present

## 2017-08-13 DIAGNOSIS — E1151 Type 2 diabetes mellitus with diabetic peripheral angiopathy without gangrene: Secondary | ICD-10-CM | POA: Diagnosis present

## 2017-08-13 DIAGNOSIS — Z79899 Other long term (current) drug therapy: Secondary | ICD-10-CM | POA: Diagnosis not present

## 2017-08-13 DIAGNOSIS — E1122 Type 2 diabetes mellitus with diabetic chronic kidney disease: Secondary | ICD-10-CM | POA: Diagnosis present

## 2017-08-13 DIAGNOSIS — Z7902 Long term (current) use of antithrombotics/antiplatelets: Secondary | ICD-10-CM | POA: Diagnosis not present

## 2017-08-13 DIAGNOSIS — R1011 Right upper quadrant pain: Secondary | ICD-10-CM | POA: Diagnosis not present

## 2017-08-13 DIAGNOSIS — F419 Anxiety disorder, unspecified: Secondary | ICD-10-CM | POA: Diagnosis present

## 2017-08-13 DIAGNOSIS — K219 Gastro-esophageal reflux disease without esophagitis: Secondary | ICD-10-CM | POA: Diagnosis present

## 2017-08-13 DIAGNOSIS — Z87891 Personal history of nicotine dependence: Secondary | ICD-10-CM | POA: Diagnosis not present

## 2017-08-13 DIAGNOSIS — Z85528 Personal history of other malignant neoplasm of kidney: Secondary | ICD-10-CM | POA: Diagnosis not present

## 2017-08-13 DIAGNOSIS — I129 Hypertensive chronic kidney disease with stage 1 through stage 4 chronic kidney disease, or unspecified chronic kidney disease: Secondary | ICD-10-CM | POA: Diagnosis present

## 2017-08-13 DIAGNOSIS — E785 Hyperlipidemia, unspecified: Secondary | ICD-10-CM | POA: Diagnosis present

## 2017-08-13 DIAGNOSIS — N390 Urinary tract infection, site not specified: Secondary | ICD-10-CM | POA: Diagnosis present

## 2017-08-13 DIAGNOSIS — E1136 Type 2 diabetes mellitus with diabetic cataract: Secondary | ICD-10-CM | POA: Diagnosis present

## 2017-08-13 DIAGNOSIS — Z87440 Personal history of urinary (tract) infections: Secondary | ICD-10-CM | POA: Diagnosis not present

## 2017-08-13 DIAGNOSIS — Z6833 Body mass index (BMI) 33.0-33.9, adult: Secondary | ICD-10-CM | POA: Diagnosis not present

## 2017-08-13 DIAGNOSIS — E669 Obesity, unspecified: Secondary | ICD-10-CM | POA: Diagnosis present

## 2017-08-13 DIAGNOSIS — F329 Major depressive disorder, single episode, unspecified: Secondary | ICD-10-CM | POA: Diagnosis present

## 2017-08-13 DIAGNOSIS — N184 Chronic kidney disease, stage 4 (severe): Secondary | ICD-10-CM | POA: Diagnosis present

## 2017-08-13 LAB — CBC
HEMATOCRIT: 34.4 % — AB (ref 35.0–47.0)
HEMOGLOBIN: 11.4 g/dL — AB (ref 12.0–16.0)
MCH: 30.2 pg (ref 26.0–34.0)
MCHC: 33 g/dL (ref 32.0–36.0)
MCV: 91.5 fL (ref 80.0–100.0)
Platelets: 191 10*3/uL (ref 150–440)
RBC: 3.76 MIL/uL — ABNORMAL LOW (ref 3.80–5.20)
RDW: 16.2 % — AB (ref 11.5–14.5)
WBC: 6.8 10*3/uL (ref 3.6–11.0)

## 2017-08-13 LAB — COMPREHENSIVE METABOLIC PANEL
ALT: 76 U/L — ABNORMAL HIGH (ref 14–54)
ANION GAP: 10 (ref 5–15)
AST: 122 U/L — ABNORMAL HIGH (ref 15–41)
Albumin: 2.7 g/dL — ABNORMAL LOW (ref 3.5–5.0)
Alkaline Phosphatase: 355 U/L — ABNORMAL HIGH (ref 38–126)
BILIRUBIN TOTAL: 2.1 mg/dL — AB (ref 0.3–1.2)
BUN: 13 mg/dL (ref 6–20)
CALCIUM: 8.2 mg/dL — AB (ref 8.9–10.3)
CO2: 24 mmol/L (ref 22–32)
Chloride: 105 mmol/L (ref 101–111)
Creatinine, Ser: 1.76 mg/dL — ABNORMAL HIGH (ref 0.44–1.00)
GFR calc Af Amer: 31 mL/min — ABNORMAL LOW (ref >60)
GFR, EST NON AFRICAN AMERICAN: 26 mL/min — AB (ref >60)
Glucose, Bld: 93 mg/dL (ref 65–99)
POTASSIUM: 4 mmol/L (ref 3.5–5.1)
Sodium: 139 mmol/L (ref 135–145)
TOTAL PROTEIN: 6.4 g/dL — AB (ref 6.5–8.1)

## 2017-08-13 LAB — GASTROINTESTINAL PANEL BY PCR, STOOL (REPLACES STOOL CULTURE)
ADENOVIRUS F40/41: NOT DETECTED
ASTROVIRUS: NOT DETECTED
CAMPYLOBACTER SPECIES: NOT DETECTED
Cryptosporidium: NOT DETECTED
Cyclospora cayetanensis: NOT DETECTED
ENTEROAGGREGATIVE E COLI (EAEC): NOT DETECTED
ENTEROPATHOGENIC E COLI (EPEC): NOT DETECTED
Entamoeba histolytica: NOT DETECTED
Enterotoxigenic E coli (ETEC): NOT DETECTED
GIARDIA LAMBLIA: NOT DETECTED
Norovirus GI/GII: NOT DETECTED
Plesimonas shigelloides: NOT DETECTED
ROTAVIRUS A: NOT DETECTED
SHIGELLA/ENTEROINVASIVE E COLI (EIEC): NOT DETECTED
Salmonella species: NOT DETECTED
Sapovirus (I, II, IV, and V): NOT DETECTED
Shiga like toxin producing E coli (STEC): NOT DETECTED
VIBRIO SPECIES: NOT DETECTED
Vibrio cholerae: NOT DETECTED
Yersinia enterocolitica: NOT DETECTED

## 2017-08-13 LAB — C DIFFICILE QUICK SCREEN W PCR REFLEX
C Diff antigen: NEGATIVE
C Diff interpretation: NOT DETECTED
C Diff toxin: NEGATIVE

## 2017-08-13 MED ORDER — CLOPIDOGREL BISULFATE 75 MG PO TABS
75.0000 mg | ORAL_TABLET | Freq: Every day | ORAL | Status: DC
  Administered 2017-08-13: 75 mg via ORAL
  Filled 2017-08-13: qty 1

## 2017-08-13 MED ORDER — PANTOPRAZOLE SODIUM 40 MG PO TBEC
40.0000 mg | DELAYED_RELEASE_TABLET | Freq: Every day | ORAL | Status: DC
  Administered 2017-08-13 – 2017-08-15 (×3): 40 mg via ORAL
  Filled 2017-08-13 (×3): qty 1

## 2017-08-13 MED ORDER — ONDANSETRON HCL 4 MG/2ML IJ SOLN: 4.0000 mg | Freq: Four times a day (QID) | INTRAMUSCULAR | Status: DC | PRN

## 2017-08-13 MED ORDER — ONDANSETRON HCL 4 MG PO TABS: 4.0000 mg | ORAL_TABLET | Freq: Four times a day (QID) | ORAL | Status: DC | PRN

## 2017-08-13 MED ORDER — CITALOPRAM HYDROBROMIDE 20 MG PO TABS
20.0000 mg | ORAL_TABLET | Freq: Every day | ORAL | Status: DC
  Administered 2017-08-13 – 2017-08-14 (×2): 20 mg via ORAL
  Filled 2017-08-13 (×2): qty 1

## 2017-08-13 MED ORDER — SODIUM CHLORIDE 0.9 % IV SOLN
2.0000 g | INTRAVENOUS | Status: DC
Start: 2017-08-13 — End: 2017-08-15
  Administered 2017-08-13 – 2017-08-15 (×3): 2 g via INTRAVENOUS
  Filled 2017-08-13 (×3): qty 20

## 2017-08-13 MED ORDER — DONEPEZIL HCL 5 MG PO TABS
5.0000 mg | ORAL_TABLET | Freq: Every day | ORAL | Status: DC
  Administered 2017-08-13 – 2017-08-14 (×2): 5 mg via ORAL
  Filled 2017-08-13 (×3): qty 1

## 2017-08-13 MED ORDER — HYDROCODONE-ACETAMINOPHEN 7.5-325 MG PO TABS
1.0000 | ORAL_TABLET | Freq: Three times a day (TID) | ORAL | Status: DC | PRN
  Administered 2017-08-13 – 2017-08-15 (×3): 1 via ORAL
  Filled 2017-08-13 (×4): qty 1

## 2017-08-13 MED ORDER — ACETAMINOPHEN 325 MG PO TABS
650.0000 mg | ORAL_TABLET | Freq: Four times a day (QID) | ORAL | Status: DC | PRN
  Administered 2017-08-13: 650 mg via ORAL
  Filled 2017-08-13: qty 2

## 2017-08-13 MED ORDER — ENOXAPARIN SODIUM 30 MG/0.3ML ~~LOC~~ SOLN
30.0000 mg | SUBCUTANEOUS | Status: DC
  Administered 2017-08-13 – 2017-08-14 (×2): 30 mg via SUBCUTANEOUS
  Filled 2017-08-13 (×2): qty 0.3

## 2017-08-13 MED ORDER — GABAPENTIN 300 MG PO CAPS
300.0000 mg | ORAL_CAPSULE | Freq: Three times a day (TID) | ORAL | Status: DC
  Administered 2017-08-13 – 2017-08-15 (×5): 300 mg via ORAL
  Filled 2017-08-13 (×5): qty 1

## 2017-08-13 MED ORDER — QUETIAPINE FUMARATE 25 MG PO TABS
50.0000 mg | ORAL_TABLET | Freq: Every day | ORAL | Status: DC
  Administered 2017-08-13 – 2017-08-14 (×2): 50 mg via ORAL
  Filled 2017-08-13 (×2): qty 2

## 2017-08-13 MED ORDER — HEPARIN SODIUM (PORCINE) 5000 UNIT/ML IJ SOLN: 5000.0000 [IU] | Freq: Three times a day (TID) | INTRAMUSCULAR | Status: DC

## 2017-08-13 MED ORDER — ATORVASTATIN CALCIUM 20 MG PO TABS
20.0000 mg | ORAL_TABLET | Freq: Every day | ORAL | Status: DC
  Administered 2017-08-13 – 2017-08-14 (×2): 20 mg via ORAL
  Filled 2017-08-13 (×2): qty 1

## 2017-08-13 MED ORDER — ACETAMINOPHEN 650 MG RE SUPP: 650.0000 mg | Freq: Four times a day (QID) | RECTAL | Status: DC | PRN

## 2017-08-13 NOTE — Progress Notes (Signed)
Pharmacy Antibiotic Note  Amy [name] is a 82 y.o. female admitted on 08/12/2017 with UTI.  Pharmacy has been consulted for ceftriaxone dosing.  Plan: Ceftriaxone 2 grams q 24 hours ordered.  Height: 5\' 2"  (157.5 cm) Weight: 154 lb (69.9 kg) IBW/kg (Calculated) : 50.1  Temp (24hrs), Avg:98.3 F (36.8 C), Min:97.9 F (36.6 C), Max:98.8 F (37.1 C)  Recent Labs  Lab 08/08/17 1513 08/11/17 1240 08/12/17 1245  WBC 6.3  --  7.6  CREATININE  --  1.66* 1.86*    Estimated Creatinine Clearance: 22.5 mL/min (A) (by C-G formula based on SCr of 1.86 mg/dL (H)).    Allergies  Allergen Reactions  . Fentanyl Other (See Comments)    urine retention  . Penicillins Itching, Rash and Other (See Comments)    Has patient had a PCN reaction causing immediate rash, facial/tongue/throat swelling, SOB or lightheadedness with hypotension: Yes Has patient had a PCN reaction causing severe rash involving mucus membranes or skin necrosis: No Has patient had a PCN reaction that required hospitalization: No Has patient had a PCN reaction occurring within the last 10 years: Yes If all of the above answers are "NO", then may proceed with Cephalosporin use.    Antimicrobials this admission: Levofloxacin x1 3/22, ceftriaxone 3/23  >>    >>   Dose adjustments this admission:   Microbiology results: 3/22 BCx: pending      3/23 LE(+) NO2(-)  WBC TNTC Thank you for allowing pharmacy to be a part of this patient's care.  Nai Borromeo S 08/13/2017 2:38 AM

## 2017-08-13 NOTE — Progress Notes (Signed)
Telephone call from [facility] [phone] at [place] advising they had a bed for the pt to transfer to; Dr Cinda Quest initiated this in the ED prior to admission last night per note in [facility]; Dr Darvin Neighbours did not mention on rounds earlier today any thing about a transfer to [facility]; asked the [facility], Purcell Nails RN what the protocol was for this and she advised since this was initiated in the ED prior to admission to [facility], the transfer would be voided; called [facility] at [phone] option 2 and advised Colletta Maryland of the AC's verbage; transfer option dropped at this time

## 2017-08-13 NOTE — Progress Notes (Signed)
Dr Darvin Neighbours discontinued Plavix medication order in [facility]

## 2017-08-13 NOTE — Progress Notes (Signed)
[place] at [place] NAME: Amy [name]    MR#:  [id number]  DATE OF BIRTH:  [birth year]  SUBJECTIVE:  CHIEF COMPLAINT:   Chief Complaint  Patient presents with  . Abdominal Pain    RUQ   Domino pain has resolved.  Patient is hungry and wants to eat.  Has had 4 loose stools, watery today.  REVIEW OF SYSTEMS:    Review of Systems  Constitutional: Negative for chills and fever.  HENT: Negative for sore throat.   Eyes: Negative for blurred vision, double vision and pain.  Respiratory: Negative for cough, hemoptysis, shortness of breath and wheezing.   Cardiovascular: Negative for chest pain, palpitations, orthopnea and leg swelling.  Gastrointestinal: Negative for abdominal pain, constipation, diarrhea, heartburn, nausea and vomiting.  Genitourinary: Negative for dysuria and hematuria.  Musculoskeletal: Negative for back pain and joint pain.  Skin: Negative for rash.  Neurological: Negative for sensory change, speech change, focal weakness and headaches.  Endo/Heme/Allergies: Does not bruise/bleed easily.  Psychiatric/Behavioral: Negative for depression. The patient is not nervous/anxious.    DRUG ALLERGIES:   Allergies  Allergen Reactions  . Fentanyl Other (See Comments)    urine retention  . Penicillins Itching, Rash and Other (See Comments)    Has patient had a PCN reaction causing immediate rash, facial/tongue/throat swelling, SOB or lightheadedness with hypotension: Yes Has patient had a PCN reaction causing severe rash involving mucus membranes or skin necrosis: No Has patient had a PCN reaction that required hospitalization: No Has patient had a PCN reaction occurring within the last 10 years: Yes If all of the above answers are "NO", then may proceed with Cephalosporin use.    VITALS:  Blood pressure 131/89, pulse 97, temperature 98.2 F (36.8 C), temperature source Oral, resp. rate 20, height 5\' 2"  (1.575 m), weight  83 kg (182 lb 15.7 oz), SpO2 91 %.  PHYSICAL EXAMINATION:   Physical Exam  GENERAL:  82 y.o.-year-old patient lying in the bed with no acute distress.  EYES: Pupils equal, round, reactive to light and accommodation. No scleral icterus. Extraocular muscles intact.  HEENT: Head atraumatic, normocephalic. Oropharynx and nasopharynx clear.  NECK:  Supple, no jugular venous distention. No thyroid enlargement, no tenderness.  LUNGS: Normal breath sounds bilaterally, no wheezing, rales, rhonchi. No use of accessory muscles of respiration.  CARDIOVASCULAR: S1, S2 normal. No murmurs, rubs, or gallops.  ABDOMEN: Soft, nontender, nondistended. Bowel sounds present. No organomegaly or mass.  EXTREMITIES: No cyanosis, clubbing or edema b/l.    NEUROLOGIC: Cranial nerves II through XII are intact. No focal Motor or sensory deficits b/l.   PSYCHIATRIC: The patient is pleasantly confused. SKIN: No obvious rash, lesion, or ulcer.   LABORATORY PANEL:   CBC Recent Labs  Lab 08/13/17 0348  WBC 6.8  HGB 11.4*  HCT 34.4*  PLT 191   ------------------------------------------------------------------------------------------------------------------ Chemistries  Recent Labs  Lab 08/13/17 0348  NA 139  K 4.0  CL 105  CO2 24  GLUCOSE 93  BUN 13  CREATININE 1.76*  CALCIUM 8.2*  AST 122*  ALT 76*  ALKPHOS 355*  BILITOT 2.1*   ------------------------------------------------------------------------------------------------------------------  Cardiac Enzymes No results for input(s): TROPONINI in the last 168 hours. ------------------------------------------------------------------------------------------------------------------  RADIOLOGY:  Ct Abdomen Pelvis Wo Contrast  Result Date: 08/12/2017 CLINICAL DATA:  Right upper quadrant pain since Monday morning. Left oophorectomy a few years ago. Nausea and vomiting with decreased appetite. EXAM: CT CHEST, ABDOMEN AND PELVIS WITHOUT CONTRAST  TECHNIQUE:  Multidetector CT imaging of the chest, abdomen and pelvis was performed following the standard protocol without IV contrast. COMPARISON:  Chest CT 02/16/2017 FINDINGS: CT CHEST FINDINGS Cardiovascular: Heart size is within normal limits with trace pericardial effusion anteriorly. Left main and three-vessel coronary arteriosclerosis is noted. Unremarkable noncontrast appearance of the pulmonary vasculature. Mediastinum/Nodes: Normal thyroid gland. Normal branch pattern of the great vessels with moderate atherosclerosis at the origins of the great vessels. No aortic aneurysm. There is mild-to-moderate atherosclerosis along the arch and descending portion. Trachea is midline. Patent mainstem bronchi. Esophagus is unremarkable. Lungs/Pleura: Minimal apical scarring on the right. Pleural-based nodular opacity in the anterior right upper lobe measuring 7.2 x 4 mm versus 6.6 x 4.9 mm previously is noted. Stable 4 mm opacity in the medial left upper lobe is also unchanged. Mild centrilobular emphysema without acute pneumonic consolidation, effusion or pneumothorax. Bibasilar dependent atelectasis. Mild peribronchial thickening bilaterally. Subsegmental atelectasis and/or scarring is seen the left lower lobe and lingula. No effusion or pneumothorax. Musculoskeletal: No chest wall mass or suspicious bone lesions identified. CT ABDOMEN PELVIS FINDINGS Hepatobiliary: No focal liver abnormality is seen. Status post cholecystectomy. Dilatation of the common bowel duct to 17 mm is identified, previously 10 mm with a subtle hyperdensity measuring 9 mm near the ampulla, previously 6 mm. Although findings could simply represent partial volume averaging of intraluminal contrast within the duodenum, given interval increase in dilatation of the common bowel duct since prior exam and patient's right upper quadrant pain, the possibility of choledocholithiasis is raised. Pancreas: Normal without pancreatic duct dilatation.  Spleen: Normal Adrenals/Urinary Tract: Normal bilateral adrenal glands. Status post left nephrectomy with compensatory hypertrophy of the right kidney. A large exophytic simple cyst is noted off the lateral aspect of the right kidney measuring 8.4 x 8.2 x 8.7 cm. Smaller simple cysts are also noted off the right kidney in addition to a complex appearing hyperdense cyst posteriorly without worrisome appearing features measuring 1.1 cm in diameter. This may represent a proteinaceous or hemorrhagic cyst. Further characterization however is not possible this unenhanced study. Renovascular calcifications are present within the medullary compartment of the right kidney. No obstructive uropathy is seen. No hydroureteronephrosis. The urinary bladder is unremarkable for degree of distention. No calculus or focal mural thickening is noted with respect to the bladder. Stomach/Bowel: Small hiatal hernia. Contrast filled stomach with normal small bowel rotation. No small bowel obstruction or inflammation. Contrast reaches the colon. There is colonic diverticulosis without acute diverticulitis. The appendix is not visualized but no pericecal inflammation is noted. Vascular/Lymphatic: Ectasias of the infrarenal abdominal aorta with moderate degree of aortoiliac and branch vessel atherosclerosis. No lymphadenopathy. Reproductive: Status post hysterectomy.  No adnexal mass. Other: No free air nor free fluid. Ventral supraumbilical fat containing hernia. Musculoskeletal: Moderate burst fracture involving the superior endplate of L1 with 3 mm retropulsion noted contributing to mild central canal stenosis. IMPRESSION: Chest CT: 1. Left main and three-vessel coronary arteriosclerosis. 2. No active pulmonary disease. Mild centrilobular emphysema. Stable appearing pleural-based nodular opacity in the anterior aspect of the right upper lobe measuring 5.6 mm in average. Stable 4 mm medial left upper lobe pleural-based opacity is unchanged.  3. Aortic atherosclerosis. Abdomen and pelvic CT: 1. 9 mm hyperdensity along the expected location of the distal common bile duct near the ampulla with dilatation of the CBD to 17 mm, both increased in appearance since prior. Although findings may simply represent imaging of intraluminal contrast, given patient's right upper quadrant pain and interval increase  in CBD caliber, choledocholithiasis is raised. The patient is status post cholecystectomy. 2. Status post left nephrectomy. Simple renal cysts of the right kidney are noted with 1 hyperdense complex appearing lesion possibly representing a hemorrhagic or proteinaceous cyst measuring approximate 1.1 cm. No worrisome features are identified with respect of this complex lesion. 3. Colonic diverticulosis without acute diverticulitis. No bowel obstruction. 4. Remote appearing burst fracture involving the superior endplate of L1 with 3 mm of retropulsion causing slight central canal stenosis. Electronically Signed   By: Ashley Royalty M.D.   On: 08/12/2017 19:25   Ct Chest Wo Contrast  Result Date: 08/12/2017 CLINICAL DATA:  Right upper quadrant pain since Monday morning. Left oophorectomy a few years ago. Nausea and vomiting with decreased appetite. EXAM: CT CHEST, ABDOMEN AND PELVIS WITHOUT CONTRAST TECHNIQUE: Multidetector CT imaging of the chest, abdomen and pelvis was performed following the standard protocol without IV contrast. COMPARISON:  Chest CT 02/16/2017 FINDINGS: CT CHEST FINDINGS Cardiovascular: Heart size is within normal limits with trace pericardial effusion anteriorly. Left main and three-vessel coronary arteriosclerosis is noted. Unremarkable noncontrast appearance of the pulmonary vasculature. Mediastinum/Nodes: Normal thyroid gland. Normal branch pattern of the great vessels with moderate atherosclerosis at the origins of the great vessels. No aortic aneurysm. There is mild-to-moderate atherosclerosis along the arch and descending portion.  Trachea is midline. Patent mainstem bronchi. Esophagus is unremarkable. Lungs/Pleura: Minimal apical scarring on the right. Pleural-based nodular opacity in the anterior right upper lobe measuring 7.2 x 4 mm versus 6.6 x 4.9 mm previously is noted. Stable 4 mm opacity in the medial left upper lobe is also unchanged. Mild centrilobular emphysema without acute pneumonic consolidation, effusion or pneumothorax. Bibasilar dependent atelectasis. Mild peribronchial thickening bilaterally. Subsegmental atelectasis and/or scarring is seen the left lower lobe and lingula. No effusion or pneumothorax. Musculoskeletal: No chest wall mass or suspicious bone lesions identified. CT ABDOMEN PELVIS FINDINGS Hepatobiliary: No focal liver abnormality is seen. Status post cholecystectomy. Dilatation of the common bowel duct to 17 mm is identified, previously 10 mm with a subtle hyperdensity measuring 9 mm near the ampulla, previously 6 mm. Although findings could simply represent partial volume averaging of intraluminal contrast within the duodenum, given interval increase in dilatation of the common bowel duct since prior exam and patient's right upper quadrant pain, the possibility of choledocholithiasis is raised. Pancreas: Normal without pancreatic duct dilatation. Spleen: Normal Adrenals/Urinary Tract: Normal bilateral adrenal glands. Status post left nephrectomy with compensatory hypertrophy of the right kidney. A large exophytic simple cyst is noted off the lateral aspect of the right kidney measuring 8.4 x 8.2 x 8.7 cm. Smaller simple cysts are also noted off the right kidney in addition to a complex appearing hyperdense cyst posteriorly without worrisome appearing features measuring 1.1 cm in diameter. This may represent a proteinaceous or hemorrhagic cyst. Further characterization however is not possible this unenhanced study. Renovascular calcifications are present within the medullary compartment of the right kidney. No  obstructive uropathy is seen. No hydroureteronephrosis. The urinary bladder is unremarkable for degree of distention. No calculus or focal mural thickening is noted with respect to the bladder. Stomach/Bowel: Small hiatal hernia. Contrast filled stomach with normal small bowel rotation. No small bowel obstruction or inflammation. Contrast reaches the colon. There is colonic diverticulosis without acute diverticulitis. The appendix is not visualized but no pericecal inflammation is noted. Vascular/Lymphatic: Ectasias of the infrarenal abdominal aorta with moderate degree of aortoiliac and branch vessel atherosclerosis. No lymphadenopathy. Reproductive: Status post hysterectomy.  No  adnexal mass. Other: No free air nor free fluid. Ventral supraumbilical fat containing hernia. Musculoskeletal: Moderate burst fracture involving the superior endplate of L1 with 3 mm retropulsion noted contributing to mild central canal stenosis. IMPRESSION: Chest CT: 1. Left main and three-vessel coronary arteriosclerosis. 2. No active pulmonary disease. Mild centrilobular emphysema. Stable appearing pleural-based nodular opacity in the anterior aspect of the right upper lobe measuring 5.6 mm in average. Stable 4 mm medial left upper lobe pleural-based opacity is unchanged. 3. Aortic atherosclerosis. Abdomen and pelvic CT: 1. 9 mm hyperdensity along the expected location of the distal common bile duct near the ampulla with dilatation of the CBD to 17 mm, both increased in appearance since prior. Although findings may simply represent imaging of intraluminal contrast, given patient's right upper quadrant pain and interval increase in CBD caliber, choledocholithiasis is raised. The patient is status post cholecystectomy. 2. Status post left nephrectomy. Simple renal cysts of the right kidney are noted with 1 hyperdense complex appearing lesion possibly representing a hemorrhagic or proteinaceous cyst measuring approximate 1.1 cm. No  worrisome features are identified with respect of this complex lesion. 3. Colonic diverticulosis without acute diverticulitis. No bowel obstruction. 4. Remote appearing burst fracture involving the superior endplate of L1 with 3 mm of retropulsion causing slight central canal stenosis. Electronically Signed   By: Ashley Royalty M.D.   On: 08/12/2017 19:25     ASSESSMENT AND PLAN:   *Choledocholithiasis without cholangitis Patient will need ERCP.  Bilirubin trended down which is encouraging.  Will repeat labs in the morning.  Waiting for ERCP on Monday.  Will hold Plavix. Appreciate GI input.  *UTI.  On IV antibiotics.  *Hypertension.  Home medications.  *Peripheral arterial disease.  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  No severe symptoms of peripheral arterial disease.  No recent stent placement.  Will hold Plavix.  *CKD stage III is stable  All the records are reviewed and case discussed with Care Management/Social Worker Management plans discussed with the patient, family and they are in agreement.  CODE STATUS: FULL CODE  DVT Prophylaxis: SCDs  TOTAL TIME TAKING CARE OF THIS PATIENT: 30 minutes.   POSSIBLE D/C IN 2-3 DAYS, DEPENDING ON CLINICAL CONDITION.  Leia Alf Hendrick Pavich M.D on 08/13/2017 at 2:06 PM  Between 7am to 6pm - Pager - [phone]  After 6pm go to [email or web address] - password [email or web address] [facility]  Office  [phone]  CC: Primary care physician; Arnetha Courser, MD  Note: This dictation was prepared with Dragon dictation along with smaller phrase technology. Any transcriptional errors that result from this process are unintentional.

## 2017-08-13 NOTE — H&P (Signed)
[facility] [place] NAME: Amy [name]    MR#:  [id number]  DATE OF BIRTH:  1936-01-30  DATE OF ADMISSION:  08/12/2017  PRIMARY CARE PHYSICIAN: Arnetha Courser, MD   REQUESTING/REFERRING PHYSICIAN: Cinda Quest, MD  CHIEF COMPLAINT:   Chief Complaint  Patient presents with  . Abdominal Pain    RUQ    HISTORY OF PRESENT ILLNESS:  Aerica Rincon  is a 82 y.o. female who presents with right upper quadrant abdominal pain for the past week.  Patient has a history of cholecystectomy, but her pain was increasing and she came to the ED tonight.  Here she was found to have likely choledocholithiasis on imaging.  Patient would benefit from ERCP and hospitalist were called for admission.  Of note, she was also found to have a UTI.  PAST MEDICAL HISTORY:   Past Medical History:  Diagnosis Date  . Anxiety   . Bilateral cataracts   . Bronchitis    hx of   . Cancer of kidney, left ([place]) 2017  . Cataract   . Chronic kidney disease   . Depression   . Diabetes mellitus without complication ([place])   . Dry cough   . 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 GERD (gastroesophageal reflux disease)   . History of frequent urinary tract infections   . Hyperlipidemia   . Hypertension   . Left renal mass   . Numbness and tingling    right hand   . Obesity   . Peripheral artery disease ([place])   . Urinary frequency   . Urinary incontinence      PAST SURGICAL HISTORY:   Past Surgical History:  Procedure Laterality Date  . ABDOMINAL HYSTERECTOMY  1978  . CARPAL TUNNEL RELEASE Bilateral   . CHOLECYSTECTOMY    . LEG AMPUTATION     above the knee / left   . ROBOT ASSISTED LAPAROSCOPIC NEPHRECTOMY Left 01/15/2015   Procedure: ROBOTIC ASSISTED LAPAROSCOPIC RADICAL NEPHRECTOMY;  Surgeon: Alexis Frock, MD;  Location: [facility];  Service: Urology;  Laterality: Left;  . stent placement right leg         SOCIAL HISTORY:   Social History   Tobacco Use  . Smoking status: Former Smoker    Packs/day: 1.50    Years: 51.00    Pack years: 76.50    Types: Cigarettes    Last attempt to quit: 05/24/2004    Years since quitting: 13.2  . Smokeless tobacco: Never Used  Substance Use Topics  . Alcohol use: No     FAMILY HISTORY:   Family History  Problem Relation Age of Onset  . Cancer Mother        Stomach  . Cancer Father   . Diabetes Brother   . Cancer Brother      DRUG ALLERGIES:   Allergies  Allergen Reactions  . Fentanyl Other (See Comments)    urine retention  . Penicillins Itching, Rash and Other (See Comments)    Has patient had a PCN reaction causing immediate rash, facial/tongue/throat swelling, SOB or lightheadedness with hypotension: Yes Has patient had a PCN reaction causing severe rash involving mucus membranes or skin necrosis: No Has patient had a PCN reaction that required hospitalization: No Has patient had a PCN reaction occurring within the last 10 years: Yes If all of the above answers are "NO", then may proceed with  Cephalosporin use.    MEDICATIONS AT HOME:   Prior to Admission medications   Medication Sig Start Date End Date Taking? Authorizing Provider  acetaminophen (TYLENOL) 500 MG tablet Take 1,000 mg by mouth every 6 (six) hours as needed for mild pain, fever or headache.     [provider]  albuterol (PROVENTIL HFA;VENTOLIN HFA) 108 (90 Base) MCG/ACT inhaler Inhale 2 puffs into the lungs every 6 (six) hours as needed for wheezing or shortness of breath. 06/15/17   Roselee Nova, MD  atorvastatin (LIPITOR) 20 MG tablet Take 1 tablet (20 mg total) by mouth at bedtime. 06/27/17   Roselee Nova, MD  benzonatate (TESSALON) 100 MG capsule Take 1 capsule (100 mg total) by mouth 2 (two) times daily as needed for cough. 08/08/17   Poulose, Bethel Born, NP  calcitRIOL (ROCALTROL) 0.25 MCG capsule Take 0.25 mcg by mouth 3 (three) times a  week. 08/01/17   [provider]  citalopram (CELEXA) 20 MG tablet TAKE 1 TABLET (20 MG TOTAL) BY MOUTH AT BEDTIME. 05/13/17   Roselee Nova, MD  clindamycin (CLEOCIN) 300 MG capsule Take 300 mg by mouth 2 (two) times daily. 08/10/17 08/17/17  [provider]  clopidogrel (PLAVIX) 75 MG tablet Take 1 tablet (75 mg total) by mouth daily. 07/28/17   Algernon Huxley, MD  docusate sodium (COLACE) 100 MG capsule Take 100 mg by mouth daily.     [provider]  donepezil (ARICEPT) 5 MG tablet Take 1 tablet by mouth at bedtime. 04/19/17   [provider]  ferrous sulfate 325 (65 FE) MG tablet Take 1 tablet (325 mg total) by mouth 2 (two) times daily with a meal. 08/20/16   Roselee Nova, MD  gabapentin (NEURONTIN) 300 MG capsule Take 1 capsule (300 mg total) by mouth 3 (three) times daily. 04/26/17   Gillis Santa, MD  glucose blood test strip Use as instructed. 03/11/16   Roselee Nova, MD  HYDROcodone-acetaminophen ([place]) 7.5-325 MG tablet Take 1 tablet by mouth 3 (three) times daily as needed. 06/21/17   Gillis Santa, MD  lisinopril (PRINIVIL,ZESTRIL) 5 MG tablet Take 1 tablet by mouth daily. 02/03/16   [provider]  metoprolol tartrate (LOPRESSOR) 50 MG tablet Take 1 tablet (50 mg total) by mouth 2 (two) times daily. 06/15/17   Roselee Nova, MD  Multiple Vitamins-Minerals (HAIR SKIN AND NAILS FORMULA) TABS Take by mouth. One tablet daily    [provider]  pantoprazole (PROTONIX) 40 MG tablet Take 1 tablet (40 mg total) by mouth daily. 04/22/17   Roselee Nova, MD  QUEtiapine (SEROQUEL) 50 MG tablet Take 50 mg by mouth at bedtime.  06/14/16   [provider]  ranitidine (ZANTAC) 150 MG capsule TAKE 1 CAPSULE AT BEDTIME 06/22/17   Roselee Nova, MD    REVIEW OF SYSTEMS:  Review of Systems  Constitutional: Negative for chills, fever, malaise/fatigue and weight loss.  HENT: Negative for ear pain, hearing loss and tinnitus.    Eyes: Negative for blurred vision, double vision, pain and redness.  Respiratory: Negative for cough, hemoptysis and shortness of breath.   Cardiovascular: Negative for chest pain, palpitations, orthopnea and leg swelling.  Gastrointestinal: Positive for abdominal pain and nausea. Negative for constipation, diarrhea and vomiting.  Genitourinary: Negative for dysuria, frequency and hematuria.  Musculoskeletal: Negative for back pain, joint pain and neck pain.  Skin:  No acne, rash, or lesions  Neurological: Negative for dizziness, tremors, focal weakness and weakness.  Endo/Heme/Allergies: Negative for polydipsia. Does not bruise/bleed easily.  Psychiatric/Behavioral: Negative for depression. The patient is not nervous/anxious and does not have insomnia.      VITAL SIGNS:   Vitals:   08/12/17 1600 08/12/17 1630 08/12/17 1941 08/12/17 2217  BP: (!) 116/56 129/79 (!) 126/58 133/67  Pulse: 95 94 99 (!) 102  Resp: (!) 21 20 (!) 22 19  Temp:   98.4 F (36.9 C) 98.8 F (37.1 C)  TempSrc:   Oral Oral  SpO2: 94% 96% 96% 93%  Weight:      Height:       Wt Readings from Last 3 Encounters:  08/12/17 69.9 kg (154 lb)  06/15/17 72.6 kg (160 lb)  04/05/17 72.6 kg (160 lb)    PHYSICAL EXAMINATION:  Physical Exam  Vitals reviewed. Constitutional: She is oriented to person, place, and time. She appears well-developed and well-nourished. No distress.  HENT:  Head: Normocephalic and atraumatic.  Mouth/Throat: Oropharynx is clear and moist.  Eyes: Pupils are equal, round, and reactive to light. Conjunctivae and EOM are normal. No scleral icterus.  Neck: Normal range of motion. Neck supple. No JVD present. No thyromegaly present.  Cardiovascular: Regular rhythm and intact distal pulses. Exam reveals no gallop and no friction rub.  No murmur heard. Borderline tachycardic  Respiratory: Effort normal and breath sounds normal. No respiratory distress. She has no wheezes. She has no rales.   GI: Soft. Bowel sounds are normal. She exhibits no distension. There is tenderness.  Musculoskeletal: Normal range of motion. She exhibits no edema.  No arthritis, no gout  Lymphadenopathy:    She has no cervical adenopathy.  Neurological: She is alert and oriented to person, place, and time. No cranial nerve deficit.  No dysarthria, no aphasia  Skin: Skin is warm and dry. No rash noted. No erythema.  Psychiatric: She has a normal mood and affect. Her behavior is normal. Judgment and thought content normal.    LABORATORY PANEL:   CBC Recent Labs  Lab 08/12/17 1245  WBC 7.6  HGB 12.0  HCT 36.8  PLT 175   ------------------------------------------------------------------------------------------------------------------  Chemistries  Recent Labs  Lab 08/12/17 1245  NA 137  K 3.5  CL 105  CO2 21*  GLUCOSE 206*  BUN 13  CREATININE 1.86*  CALCIUM 7.7*  AST 166*  ALT 90*  ALKPHOS 372*  BILITOT 2.9*   ------------------------------------------------------------------------------------------------------------------  Cardiac Enzymes No results for input(s): TROPONINI in the last 168 hours. ------------------------------------------------------------------------------------------------------------------  RADIOLOGY:  Ct Abdomen Pelvis Wo Contrast  Result Date: 08/12/2017 CLINICAL DATA:  Right upper quadrant pain since Monday morning. Left oophorectomy a few years ago. Nausea and vomiting with decreased appetite. EXAM: CT CHEST, ABDOMEN AND PELVIS WITHOUT CONTRAST TECHNIQUE: Multidetector CT imaging of the chest, abdomen and pelvis was performed following the standard protocol without IV contrast. COMPARISON:  Chest CT 02/16/2017 FINDINGS: CT CHEST FINDINGS Cardiovascular: Heart size is within normal limits with trace pericardial effusion anteriorly. Left main and three-vessel coronary arteriosclerosis is noted. Unremarkable noncontrast appearance of the pulmonary vasculature.  Mediastinum/Nodes: Normal thyroid gland. Normal branch pattern of the great vessels with moderate atherosclerosis at the origins of the great vessels. No aortic aneurysm. There is mild-to-moderate atherosclerosis along the arch and descending portion. Trachea is midline. Patent mainstem bronchi. Esophagus is unremarkable. Lungs/Pleura: Minimal apical scarring on the right. Pleural-based nodular opacity in the anterior right upper lobe measuring 7.2 x  4 mm versus 6.6 x 4.9 mm previously is noted. Stable 4 mm opacity in the medial left upper lobe is also unchanged. Mild centrilobular emphysema without acute pneumonic consolidation, effusion or pneumothorax. Bibasilar dependent atelectasis. Mild peribronchial thickening bilaterally. Subsegmental atelectasis and/or scarring is seen the left lower lobe and lingula. No effusion or pneumothorax. Musculoskeletal: No chest wall mass or suspicious bone lesions identified. CT ABDOMEN PELVIS FINDINGS Hepatobiliary: No focal liver abnormality is seen. Status post cholecystectomy. Dilatation of the common bowel duct to 17 mm is identified, previously 10 mm with a subtle hyperdensity measuring 9 mm near the ampulla, previously 6 mm. Although findings could simply represent partial volume averaging of intraluminal contrast within the duodenum, given interval increase in dilatation of the common bowel duct since prior exam and patient's right upper quadrant pain, the possibility of choledocholithiasis is raised. Pancreas: Normal without pancreatic duct dilatation. Spleen: Normal Adrenals/Urinary Tract: Normal bilateral adrenal glands. Status post left nephrectomy with compensatory hypertrophy of the right kidney. A large exophytic simple cyst is noted off the lateral aspect of the right kidney measuring 8.4 x 8.2 x 8.7 cm. Smaller simple cysts are also noted off the right kidney in addition to a complex appearing hyperdense cyst posteriorly without worrisome appearing features  measuring 1.1 cm in diameter. This may represent a proteinaceous or hemorrhagic cyst. Further characterization however is not possible this unenhanced study. Renovascular calcifications are present within the medullary compartment of the right kidney. No obstructive uropathy is seen. No hydroureteronephrosis. The urinary bladder is unremarkable for degree of distention. No calculus or focal mural thickening is noted with respect to the bladder. Stomach/Bowel: Small hiatal hernia. Contrast filled stomach with normal small bowel rotation. No small bowel obstruction or inflammation. Contrast reaches the colon. There is colonic diverticulosis without acute diverticulitis. The appendix is not visualized but no pericecal inflammation is noted. Vascular/Lymphatic: Ectasias of the infrarenal abdominal aorta with moderate degree of aortoiliac and branch vessel atherosclerosis. No lymphadenopathy. Reproductive: Status post hysterectomy.  No adnexal mass. Other: No free air nor free fluid. Ventral supraumbilical fat containing hernia. Musculoskeletal: Moderate burst fracture involving the superior endplate of L1 with 3 mm retropulsion noted contributing to mild central canal stenosis. IMPRESSION: Chest CT: 1. Left main and three-vessel coronary arteriosclerosis. 2. No active pulmonary disease. Mild centrilobular emphysema. Stable appearing pleural-based nodular opacity in the anterior aspect of the right upper lobe measuring 5.6 mm in average. Stable 4 mm medial left upper lobe pleural-based opacity is unchanged. 3. Aortic atherosclerosis. Abdomen and pelvic CT: 1. 9 mm hyperdensity along the expected location of the distal common bile duct near the ampulla with dilatation of the CBD to 17 mm, both increased in appearance since prior. Although findings may simply represent imaging of intraluminal contrast, given patient's right upper quadrant pain and interval increase in CBD caliber, choledocholithiasis is raised. The patient  is status post cholecystectomy. 2. Status post left nephrectomy. Simple renal cysts of the right kidney are noted with 1 hyperdense complex appearing lesion possibly representing a hemorrhagic or proteinaceous cyst measuring approximate 1.1 cm. No worrisome features are identified with respect of this complex lesion. 3. Colonic diverticulosis without acute diverticulitis. No bowel obstruction. 4. Remote appearing burst fracture involving the superior endplate of L1 with 3 mm of retropulsion causing slight central canal stenosis. Electronically Signed   By: Ashley Royalty M.D.   On: 08/12/2017 19:25   Ct Chest Wo Contrast  Result Date: 08/12/2017 CLINICAL DATA:  Right upper quadrant pain since  Monday morning. Left oophorectomy a few years ago. Nausea and vomiting with decreased appetite. EXAM: CT CHEST, ABDOMEN AND PELVIS WITHOUT CONTRAST TECHNIQUE: Multidetector CT imaging of the chest, abdomen and pelvis was performed following the standard protocol without IV contrast. COMPARISON:  Chest CT 02/16/2017 FINDINGS: CT CHEST FINDINGS Cardiovascular: Heart size is within normal limits with trace pericardial effusion anteriorly. Left main and three-vessel coronary arteriosclerosis is noted. Unremarkable noncontrast appearance of the pulmonary vasculature. Mediastinum/Nodes: Normal thyroid gland. Normal branch pattern of the great vessels with moderate atherosclerosis at the origins of the great vessels. No aortic aneurysm. There is mild-to-moderate atherosclerosis along the arch and descending portion. Trachea is midline. Patent mainstem bronchi. Esophagus is unremarkable. Lungs/Pleura: Minimal apical scarring on the right. Pleural-based nodular opacity in the anterior right upper lobe measuring 7.2 x 4 mm versus 6.6 x 4.9 mm previously is noted. Stable 4 mm opacity in the medial left upper lobe is also unchanged. Mild centrilobular emphysema without acute pneumonic consolidation, effusion or pneumothorax. Bibasilar  dependent atelectasis. Mild peribronchial thickening bilaterally. Subsegmental atelectasis and/or scarring is seen the left lower lobe and lingula. No effusion or pneumothorax. Musculoskeletal: No chest wall mass or suspicious bone lesions identified. CT ABDOMEN PELVIS FINDINGS Hepatobiliary: No focal liver abnormality is seen. Status post cholecystectomy. Dilatation of the common bowel duct to 17 mm is identified, previously 10 mm with a subtle hyperdensity measuring 9 mm near the ampulla, previously 6 mm. Although findings could simply represent partial volume averaging of intraluminal contrast within the duodenum, given interval increase in dilatation of the common bowel duct since prior exam and patient's right upper quadrant pain, the possibility of choledocholithiasis is raised. Pancreas: Normal without pancreatic duct dilatation. Spleen: Normal Adrenals/Urinary Tract: Normal bilateral adrenal glands. Status post left nephrectomy with compensatory hypertrophy of the right kidney. A large exophytic simple cyst is noted off the lateral aspect of the right kidney measuring 8.4 x 8.2 x 8.7 cm. Smaller simple cysts are also noted off the right kidney in addition to a complex appearing hyperdense cyst posteriorly without worrisome appearing features measuring 1.1 cm in diameter. This may represent a proteinaceous or hemorrhagic cyst. Further characterization however is not possible this unenhanced study. Renovascular calcifications are present within the medullary compartment of the right kidney. No obstructive uropathy is seen. No hydroureteronephrosis. The urinary bladder is unremarkable for degree of distention. No calculus or focal mural thickening is noted with respect to the bladder. Stomach/Bowel: Small hiatal hernia. Contrast filled stomach with normal small bowel rotation. No small bowel obstruction or inflammation. Contrast reaches the colon. There is colonic diverticulosis without acute diverticulitis.  The appendix is not visualized but no pericecal inflammation is noted. Vascular/Lymphatic: Ectasias of the infrarenal abdominal aorta with moderate degree of aortoiliac and branch vessel atherosclerosis. No lymphadenopathy. Reproductive: Status post hysterectomy.  No adnexal mass. Other: No free air nor free fluid. Ventral supraumbilical fat containing hernia. Musculoskeletal: Moderate burst fracture involving the superior endplate of L1 with 3 mm retropulsion noted contributing to mild central canal stenosis. IMPRESSION: Chest CT: 1. Left main and three-vessel coronary arteriosclerosis. 2. No active pulmonary disease. Mild centrilobular emphysema. Stable appearing pleural-based nodular opacity in the anterior aspect of the right upper lobe measuring 5.6 mm in average. Stable 4 mm medial left upper lobe pleural-based opacity is unchanged. 3. Aortic atherosclerosis. Abdomen and pelvic CT: 1. 9 mm hyperdensity along the expected location of the distal common bile duct near the ampulla with dilatation of the CBD to 17 mm, both  increased in appearance since prior. Although findings may simply represent imaging of intraluminal contrast, given patient's right upper quadrant pain and interval increase in CBD caliber, choledocholithiasis is raised. The patient is status post cholecystectomy. 2. Status post left nephrectomy. Simple renal cysts of the right kidney are noted with 1 hyperdense complex appearing lesion possibly representing a hemorrhagic or proteinaceous cyst measuring approximate 1.1 cm. No worrisome features are identified with respect of this complex lesion. 3. Colonic diverticulosis without acute diverticulitis. No bowel obstruction. 4. Remote appearing burst fracture involving the superior endplate of L1 with 3 mm of retropulsion causing slight central canal stenosis. Electronically Signed   By: Ashley Royalty M.D.   On: 08/12/2017 19:25    EKG:   Orders placed or performed during the hospital encounter  of 08/12/17  . ED EKG  . ED EKG    IMPRESSION AND PLAN:  Principal Problem:   Choledocholithiasis -patient would likely benefit from ERCP.  We will get a gastroenterology consult.  It appears that the gastroenterologist we have on call here does not do ERCP, but patient could potentially be transferred to [facility] during the day to have the procedure done and then transported back to our hospital afterwards. Active Problems:   UTI (urinary tract infection) -IV antibiotics, urine culture was not sent from the ED, it is been ordered   HTN (hypertension) -continue home meds   Anxiety -home dose anxiolytic   GERD (gastroesophageal reflux disease) home dose PPI   CKD (chronic kidney disease) stage 4, GFR 15-29 ml/min (HCC) at baseline, avoid nephrotoxins and monitor  Chart review performed and case discussed with ED provider. Labs, imaging and/or ECG reviewed by provider and discussed with patient/family. Management plans discussed with the patient and/or family.  DVT PROPHYLAXIS: SubQ heparin  GI PROPHYLAXIS: PPI  ADMISSION STATUS: Inpatient  CODE STATUS: Full Code Status History    Date Active Date Inactive Code Status Order ID Comments User Context   01/15/2015 2127 01/17/2015 1541 Full Code [id number]  Star Age, MD Inpatient      TOTAL TIME TAKING CARE OF THIS PATIENT: 45 minutes.   Alyzza Andringa [place] 08/13/2017, 2:05 AM  [facility]  Office  [phone]  CC: Primary care physician; Arnetha Courser, MD  Note:  This document was prepared using Dragon voice recognition software and may include unintentional dictation errors.

## 2017-08-13 NOTE — ED Notes (Signed)
Pt had a BM provided perineal care no other needs at present

## 2017-08-13 NOTE — Consult Note (Signed)
[name], MD [address], [place], [place], [place], [address] [address], [place], [place], [place], [address] Phone: [phone]  Fax: [phone]  Consultation  Referring Provider:     Dr. Darvin Neighbours Primary Care Physician:  Arnetha Courser, MD Primary Gastroenterologist:  Virgel Manifold, MD        Reason for Consultation:     Elevated liver enzymes  Date of Admission:  08/12/2017 Date of Consultation:  08/13/2017         HPI:   Amy [name] is a 82 y.o. female who presented with right upper quadrant pain, ongoing for 3 to 4 days, intermittent, cramping, 5/10, with no radiation, with no nausea or vomiting.  No previous history of similar symptoms.  Patient has had previous history of cholecystectomy.  She is also being treated for UTI while inpatient.  Lab work showed elevated liver enzymes including bilirubin and alk phos.  CT showed no millimeter hyperdensity at the distal CBD, and dilated CBD to 17 mm. Reports chills at home. No documented fever.   Patient has a history of renal cancer, T2, and 1, M0 was diagnosed in 2016.  She had an radical nephrectomy 2016.  As of August 2018 noted by her oncologist, she is in remission.  She has history of left AKA about 10 years ago.  She has peripheral vascular disease and is on Plavix, followed by Dr. Lucky Cowboy  Past Medical History:  Diagnosis Date  . Anxiety   . Bilateral cataracts   . Bronchitis    hx of   . Cancer of kidney, left ([facility]) 2017  . Cataract   . Chronic kidney disease   . Depression   . Diabetes mellitus without complication ([facility])   . Dry cough   . 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 GERD (gastroesophageal reflux disease)   . History of frequent urinary tract infections   . Hyperlipidemia   . Hypertension   . Left renal mass   . Numbness and tingling    right hand   . Obesity   . Peripheral artery disease ([facility])   . Urinary frequency   .  Urinary incontinence     Past Surgical History:  Procedure Laterality Date  . ABDOMINAL HYSTERECTOMY  1978  . CARPAL TUNNEL RELEASE Bilateral   . CHOLECYSTECTOMY    . LEG AMPUTATION     above the knee / left   . ROBOT ASSISTED LAPAROSCOPIC NEPHRECTOMY Left 01/15/2015   Procedure: ROBOTIC ASSISTED LAPAROSCOPIC RADICAL NEPHRECTOMY;  Surgeon: Alexis Frock, MD;  Location: [facility];  Service: Urology;  Laterality: Left;  . stent placement right leg       Prior to Admission medications   Medication Sig Start Date End Date Taking? Authorizing Provider  acetaminophen (TYLENOL) 500 MG tablet Take 1,000 mg by mouth every 6 (six) hours as needed for mild pain, fever or headache.     [provider]  albuterol (PROVENTIL HFA;VENTOLIN HFA) 108 (90 Base) MCG/ACT inhaler Inhale 2 puffs into the lungs every 6 (six) hours as needed for wheezing or shortness of breath. 06/15/17   Roselee Nova, MD  atorvastatin (LIPITOR) 20 MG tablet Take 1 tablet (20 mg total) by mouth at bedtime. 06/27/17   Roselee Nova, MD  benzonatate (TESSALON) 100 MG capsule Take 1 capsule (100 mg total) by mouth 2 (two) times daily as needed for cough.  08/08/17   Poulose, Bethel Born, NP  calcitRIOL (ROCALTROL) 0.25 MCG capsule Take 0.25 mcg by mouth 3 (three) times a week. 08/01/17   [provider]  citalopram (CELEXA) 20 MG tablet TAKE 1 TABLET (20 MG TOTAL) BY MOUTH AT BEDTIME. 05/13/17   Roselee Nova, MD  clindamycin (CLEOCIN) 300 MG capsule Take 300 mg by mouth 2 (two) times daily. 08/10/17 08/17/17  [provider]  clopidogrel (PLAVIX) 75 MG tablet Take 1 tablet (75 mg total) by mouth daily. 07/28/17   Algernon Huxley, MD  docusate sodium (COLACE) 100 MG capsule Take 100 mg by mouth daily.     [provider]  donepezil (ARICEPT) 5 MG tablet Take 1 tablet by mouth at bedtime. 04/19/17   [provider]  ferrous sulfate 325 (65 FE) MG tablet Take 1 tablet (325 mg total) by mouth 2  (two) times daily with a meal. 08/20/16   Roselee Nova, MD  gabapentin (NEURONTIN) 300 MG capsule Take 1 capsule (300 mg total) by mouth 3 (three) times daily. 04/26/17   Gillis Santa, MD  glucose blood test strip Use as instructed. 03/11/16   Roselee Nova, MD  HYDROcodone-acetaminophen ([place]) 7.5-325 MG tablet Take 1 tablet by mouth 3 (three) times daily as needed. 06/21/17   Gillis Santa, MD  lisinopril (PRINIVIL,ZESTRIL) 5 MG tablet Take 1 tablet by mouth daily. 02/03/16   [provider]  metoprolol tartrate (LOPRESSOR) 50 MG tablet Take 1 tablet (50 mg total) by mouth 2 (two) times daily. 06/15/17   Roselee Nova, MD  Multiple Vitamins-Minerals (HAIR SKIN AND NAILS FORMULA) TABS Take by mouth. One tablet daily    [provider]  pantoprazole (PROTONIX) 40 MG tablet Take 1 tablet (40 mg total) by mouth daily. 04/22/17   Roselee Nova, MD  QUEtiapine (SEROQUEL) 50 MG tablet Take 50 mg by mouth at bedtime.  06/14/16   [provider]  ranitidine (ZANTAC) 150 MG capsule TAKE 1 CAPSULE AT BEDTIME 06/22/17   Roselee Nova, MD    Family History  Problem Relation Age of Onset  . Cancer Mother        Stomach  . Cancer Father   . Diabetes Brother   . Cancer Brother      Social History   Tobacco Use  . Smoking status: Former Smoker    Packs/day: 1.50    Years: 51.00    Pack years: 76.50    Types: Cigarettes    Last attempt to quit: 05/24/2004    Years since quitting: 13.2  . Smokeless tobacco: Never Used  Substance Use Topics  . Alcohol use: No  . Drug use: No    Allergies as of 08/12/2017 - Review Complete 08/12/2017  Allergen Reaction Noted  . Fentanyl Other (See Comments) 11/05/2014  . Penicillins Itching, Rash, and Other (See Comments) 11/05/2014    Review of Systems:    All systems reviewed and negative except where noted in HPI.   Physical Exam:  Vital signs in last 24 hours: Vitals:   08/12/17 1941 08/12/17 2217 08/13/17  0222 08/13/17 0353  BP: (!) 126/58 133/67 104/81 135/80  Pulse: 99 (!) 102 99 99  Resp: (!) 22 19 (!) 21 18  Temp: 98.4 F (36.9 C) 98.8 F (37.1 C) 97.9 F (36.6 C) 98.3 F (36.8 C)  TempSrc: Oral Oral Oral Oral  SpO2: 96% 93% 94% 94%  Weight:    182  lb 15.7 oz (83 kg)  Height:    _0  (1.575 m)   Last BM Date: 08/13/17 General:   Pleasant, cooperative in NAD Head:  Normocephalic and atraumatic. Eyes:   No icterus.   Conjunctiva pink. PERRLA. Ears:  Normal auditory acuity. Neck:  Supple; no masses or thyroidomegaly Abdomen:  Soft, nondistended, nontender. Normal bowel sounds. No appreciable masses or hepatomegaly.  No rebound or guarding.  Neurologic:  Alert and oriented x3;  grossly normal neurologically. Skin:  Intact without significant lesions or rashes. Cervical Nodes:  No significant cervical adenopathy. Psych:  Alert and cooperative. Normal affect.  LAB RESULTS: Recent Labs    08/12/17 1245 08/13/17 0348  WBC 7.6 6.8  HGB 12.0 11.4*  HCT 36.8 34.4*  PLT 175 191   BMET Recent Labs    08/11/17 1240 08/12/17 1245 08/13/17 0348  NA 137 137 139  K 4.0 3.5 4.0  CL 102 105 105  CO2 27 21* 24  GLUCOSE 107* 206* 93  BUN _1 CREATININE 1.66* 1.86* 1.76*  CALCIUM 7.6* 7.7* 8.2*   LFT Recent Labs    08/13/17 0348  PROT 6.4*  ALBUMIN 2.7*  AST 122*  ALT 76*  ALKPHOS 355*  BILITOT 2.1*   PT/INR No results for input(s): LABPROT, INR in the last 72 hours.  STUDIES: Ct Abdomen Pelvis Wo Contrast  Result Date: 08/12/2017 CLINICAL DATA:  Right upper quadrant pain since Monday morning. Left oophorectomy a few years ago. Nausea and vomiting with decreased appetite. EXAM: CT CHEST, ABDOMEN AND PELVIS WITHOUT CONTRAST TECHNIQUE: Multidetector CT imaging of the chest, abdomen and pelvis was performed following the standard protocol without IV contrast. COMPARISON:  Chest CT 02/16/2017 FINDINGS: CT CHEST FINDINGS Cardiovascular: Heart size is within normal  limits with trace pericardial effusion anteriorly. Left main and three-vessel coronary arteriosclerosis is noted. Unremarkable noncontrast appearance of the pulmonary vasculature. Mediastinum/Nodes: Normal thyroid gland. Normal branch pattern of the great vessels with moderate atherosclerosis at the origins of the great vessels. No aortic aneurysm. There is mild-to-moderate atherosclerosis along the arch and descending portion. Trachea is midline. Patent mainstem bronchi. Esophagus is unremarkable. Lungs/Pleura: Minimal apical scarring on the right. Pleural-based nodular opacity in the anterior right upper lobe measuring 7.2 x 4 mm versus 6.6 x 4.9 mm previously is noted. Stable 4 mm opacity in the medial left upper lobe is also unchanged. Mild centrilobular emphysema without acute pneumonic consolidation, effusion or pneumothorax. Bibasilar dependent atelectasis. Mild peribronchial thickening bilaterally. Subsegmental atelectasis and/or scarring is seen the left lower lobe and lingula. No effusion or pneumothorax. Musculoskeletal: No chest wall mass or suspicious bone lesions identified. CT ABDOMEN PELVIS FINDINGS Hepatobiliary: No focal liver abnormality is seen. Status post cholecystectomy. Dilatation of the common bowel duct to 17 mm is identified, previously 10 mm with a subtle hyperdensity measuring 9 mm near the ampulla, previously 6 mm. Although findings could simply represent partial volume averaging of intraluminal contrast within the duodenum, given interval increase in dilatation of the common bowel duct since prior exam and patient's right upper quadrant pain, the possibility of choledocholithiasis is raised. Pancreas: Normal without pancreatic duct dilatation. Spleen: Normal Adrenals/Urinary Tract: Normal bilateral adrenal glands. Status post left nephrectomy with compensatory hypertrophy of the right kidney. A large exophytic simple cyst is noted off the lateral aspect of the right kidney measuring  8.4 x 8.2 x 8.7 cm. Smaller simple cysts are also noted off the right kidney in addition to a complex appearing hyperdense cyst  posteriorly without worrisome appearing features measuring 1.1 cm in diameter. This may represent a proteinaceous or hemorrhagic cyst. Further characterization however is not possible this unenhanced study. Renovascular calcifications are present within the medullary compartment of the right kidney. No obstructive uropathy is seen. No hydroureteronephrosis. The urinary bladder is unremarkable for degree of distention. No calculus or focal mural thickening is noted with respect to the bladder. Stomach/Bowel: Small hiatal hernia. Contrast filled stomach with normal small bowel rotation. No small bowel obstruction or inflammation. Contrast reaches the colon. There is colonic diverticulosis without acute diverticulitis. The appendix is not visualized but no pericecal inflammation is noted. Vascular/Lymphatic: Ectasias of the infrarenal abdominal aorta with moderate degree of aortoiliac and branch vessel atherosclerosis. No lymphadenopathy. Reproductive: Status post hysterectomy.  No adnexal mass. Other: No free air nor free fluid. Ventral supraumbilical fat containing hernia. Musculoskeletal: Moderate burst fracture involving the superior endplate of L1 with 3 mm retropulsion noted contributing to mild central canal stenosis. IMPRESSION: Chest CT: 1. Left main and three-vessel coronary arteriosclerosis. 2. No active pulmonary disease. Mild centrilobular emphysema. Stable appearing pleural-based nodular opacity in the anterior aspect of the right upper lobe measuring 5.6 mm in average. Stable 4 mm medial left upper lobe pleural-based opacity is unchanged. 3. Aortic atherosclerosis. Abdomen and pelvic CT: 1. 9 mm hyperdensity along the expected location of the distal common bile duct near the ampulla with dilatation of the CBD to 17 mm, both increased in appearance since prior. Although findings  may simply represent imaging of intraluminal contrast, given patient's right upper quadrant pain and interval increase in CBD caliber, choledocholithiasis is raised. The patient is status post cholecystectomy. 2. Status post left nephrectomy. Simple renal cysts of the right kidney are noted with 1 hyperdense complex appearing lesion possibly representing a hemorrhagic or proteinaceous cyst measuring approximate 1.1 cm. No worrisome features are identified with respect of this complex lesion. 3. Colonic diverticulosis without acute diverticulitis. No bowel obstruction. 4. Remote appearing burst fracture involving the superior endplate of L1 with 3 mm of retropulsion causing slight central canal stenosis. Electronically Signed   By: Ashley Royalty M.D.   On: 08/12/2017 19:25   Ct Chest Wo Contrast  Result Date: 08/12/2017 CLINICAL DATA:  Right upper quadrant pain since Monday morning. Left oophorectomy a few years ago. Nausea and vomiting with decreased appetite. EXAM: CT CHEST, ABDOMEN AND PELVIS WITHOUT CONTRAST TECHNIQUE: Multidetector CT imaging of the chest, abdomen and pelvis was performed following the standard protocol without IV contrast. COMPARISON:  Chest CT 02/16/2017 FINDINGS: CT CHEST FINDINGS Cardiovascular: Heart size is within normal limits with trace pericardial effusion anteriorly. Left main and three-vessel coronary arteriosclerosis is noted. Unremarkable noncontrast appearance of the pulmonary vasculature. Mediastinum/Nodes: Normal thyroid gland. Normal branch pattern of the great vessels with moderate atherosclerosis at the origins of the great vessels. No aortic aneurysm. There is mild-to-moderate atherosclerosis along the arch and descending portion. Trachea is midline. Patent mainstem bronchi. Esophagus is unremarkable. Lungs/Pleura: Minimal apical scarring on the right. Pleural-based nodular opacity in the anterior right upper lobe measuring 7.2 x 4 mm versus 6.6 x 4.9 mm previously is  noted. Stable 4 mm opacity in the medial left upper lobe is also unchanged. Mild centrilobular emphysema without acute pneumonic consolidation, effusion or pneumothorax. Bibasilar dependent atelectasis. Mild peribronchial thickening bilaterally. Subsegmental atelectasis and/or scarring is seen the left lower lobe and lingula. No effusion or pneumothorax. Musculoskeletal: No chest wall mass or suspicious bone lesions identified. CT ABDOMEN PELVIS FINDINGS Hepatobiliary: No  focal liver abnormality is seen. Status post cholecystectomy. Dilatation of the common bowel duct to 17 mm is identified, previously 10 mm with a subtle hyperdensity measuring 9 mm near the ampulla, previously 6 mm. Although findings could simply represent partial volume averaging of intraluminal contrast within the duodenum, given interval increase in dilatation of the common bowel duct since prior exam and patient's right upper quadrant pain, the possibility of choledocholithiasis is raised. Pancreas: Normal without pancreatic duct dilatation. Spleen: Normal Adrenals/Urinary Tract: Normal bilateral adrenal glands. Status post left nephrectomy with compensatory hypertrophy of the right kidney. A large exophytic simple cyst is noted off the lateral aspect of the right kidney measuring 8.4 x 8.2 x 8.7 cm. Smaller simple cysts are also noted off the right kidney in addition to a complex appearing hyperdense cyst posteriorly without worrisome appearing features measuring 1.1 cm in diameter. This may represent a proteinaceous or hemorrhagic cyst. Further characterization however is not possible this unenhanced study. Renovascular calcifications are present within the medullary compartment of the right kidney. No obstructive uropathy is seen. No hydroureteronephrosis. The urinary bladder is unremarkable for degree of distention. No calculus or focal mural thickening is noted with respect to the bladder. Stomach/Bowel: Small hiatal hernia. Contrast  filled stomach with normal small bowel rotation. No small bowel obstruction or inflammation. Contrast reaches the colon. There is colonic diverticulosis without acute diverticulitis. The appendix is not visualized but no pericecal inflammation is noted. Vascular/Lymphatic: Ectasias of the infrarenal abdominal aorta with moderate degree of aortoiliac and branch vessel atherosclerosis. No lymphadenopathy. Reproductive: Status post hysterectomy.  No adnexal mass. Other: No free air nor free fluid. Ventral supraumbilical fat containing hernia. Musculoskeletal: Moderate burst fracture involving the superior endplate of L1 with 3 mm retropulsion noted contributing to mild central canal stenosis. IMPRESSION: Chest CT: 1. Left main and three-vessel coronary arteriosclerosis. 2. No active pulmonary disease. Mild centrilobular emphysema. Stable appearing pleural-based nodular opacity in the anterior aspect of the right upper lobe measuring 5.6 mm in average. Stable 4 mm medial left upper lobe pleural-based opacity is unchanged. 3. Aortic atherosclerosis. Abdomen and pelvic CT: 1. 9 mm hyperdensity along the expected location of the distal common bile duct near the ampulla with dilatation of the CBD to 17 mm, both increased in appearance since prior. Although findings may simply represent imaging of intraluminal contrast, given patient's right upper quadrant pain and interval increase in CBD caliber, choledocholithiasis is raised. The patient is status post cholecystectomy. 2. Status post left nephrectomy. Simple renal cysts of the right kidney are noted with 1 hyperdense complex appearing lesion possibly representing a hemorrhagic or proteinaceous cyst measuring approximate 1.1 cm. No worrisome features are identified with respect of this complex lesion. 3. Colonic diverticulosis without acute diverticulitis. No bowel obstruction. 4. Remote appearing burst fracture involving the superior endplate of L1 with 3 mm of  retropulsion causing slight central canal stenosis. Electronically Signed   By: Ashley Royalty M.D.   On: 08/12/2017 19:25      Impression / Plan:   Amy [name] is a 82 y.o. y/o female with elevated liver enzymes, previous history of cholecystectomy with CT showing 9 mm hyperdensity in the CBD, and CBD dilation  Findings are consistent with choledocholithiasis No signs of cholangitis present If bilirubin continues to stay elevated, patient will need ERCP.  However, Plavix will need to be held prior to the procedure. She received a dose today  Please contact Dr. Lucky Cowboy to confirm if Plavix can safely held and  hold.  I will contact [facility] about timing of ERCP. Dr. Allen Norris is not available entire week to do it at [facility] If liver enzymes improve can consider MRCP to see if stone has passed  Avoid hepatotoxic drugs Continue daily CMP  Pt reports chronic loose stools 2-3 times a day with no blood Gi panel and C. Diff pending.    Thank you for involving me in the care of this patient.      LOS: 0 days   Virgel Manifold, MD  08/13/2017, 11:21 AM

## 2017-08-13 NOTE — Progress Notes (Signed)
Advised Dr Darvin Neighbours via telephone of my conversation with Dr Bonna Gains at [phone]; Dr Darvin Neighbours to handle Plavix portion of said conversation

## 2017-08-13 NOTE — Plan of Care (Signed)
  Problem: Clinical Measurements: Goal: Will remain free from infection Outcome: Progressing  -stool specimen sent this shift; results negative for cdiff and GI panel Problem: Clinical Measurements: Goal: Cardiovascular complication will be avoided Outcome: Progressing  -off unit Telemetry monitor discontinued by MD this shift Problem: Nutrition: Goal: Adequate nutrition will be maintained Outcome: Progressing  -pt started on Low Fat (Heart healthy diet in [facility]) at lunch this shift

## 2017-08-13 NOTE — Progress Notes (Signed)
Per conversation with GI Consulting physician, Dr Bonna Gains; pt needs to have and MRCP; 1. Pt on Plavix need to get with Dr Lucky Cowboy (pt's Vascular physician) to see if the pt can come off of Plavix and if she can how long before MRCP procedure can be performed 2. Dr Allen Norris (GI MD) is on vacation all next week; if pt able to get MRCP will have to be done at [facility] in [place], [place];  If pt's bilirubin decreases pt may not need MRCP and would be able to perform ERCP here at [facility]; the earliest [facility] could perform MRCP would be on Monday, 08/15/17; pt may be on a low fat diet for now

## 2017-08-14 ENCOUNTER — Inpatient Hospital Stay: Payer: Medicare HMO

## 2017-08-14 DIAGNOSIS — R911 Solitary pulmonary nodule: Secondary | ICD-10-CM | POA: Diagnosis not present

## 2017-08-14 DIAGNOSIS — J9811 Atelectasis: Secondary | ICD-10-CM | POA: Diagnosis not present

## 2017-08-14 DIAGNOSIS — J439 Emphysema, unspecified: Secondary | ICD-10-CM | POA: Diagnosis not present

## 2017-08-14 LAB — COMPREHENSIVE METABOLIC PANEL
ALT: 55 U/L — AB (ref 14–54)
AST: 79 U/L — AB (ref 15–41)
Albumin: 2.6 g/dL — ABNORMAL LOW (ref 3.5–5.0)
Alkaline Phosphatase: 281 U/L — ABNORMAL HIGH (ref 38–126)
Anion gap: 7 (ref 5–15)
BILIRUBIN TOTAL: 1.5 mg/dL — AB (ref 0.3–1.2)
BUN: 11 mg/dL (ref 6–20)
CALCIUM: 8.7 mg/dL — AB (ref 8.9–10.3)
CO2: 25 mmol/L (ref 22–32)
CREATININE: 1.63 mg/dL — AB (ref 0.44–1.00)
Chloride: 107 mmol/L (ref 101–111)
GFR calc Af Amer: 33 mL/min — ABNORMAL LOW (ref >60)
GFR, EST NON AFRICAN AMERICAN: 29 mL/min — AB (ref >60)
Glucose, Bld: 106 mg/dL — ABNORMAL HIGH (ref 65–99)
Potassium: 4.4 mmol/L (ref 3.5–5.1)
Sodium: 139 mmol/L (ref 135–145)
TOTAL PROTEIN: 6.3 g/dL — AB (ref 6.5–8.1)

## 2017-08-14 MED ORDER — IPRATROPIUM-ALBUTEROL 0.5-2.5 (3) MG/3ML IN SOLN
3.0000 mL | Freq: Four times a day (QID) | RESPIRATORY_TRACT | Status: DC
  Administered 2017-08-14 – 2017-08-15 (×4): 3 mL via RESPIRATORY_TRACT
  Filled 2017-08-14 (×5): qty 3

## 2017-08-14 MED ORDER — LOPERAMIDE HCL 2 MG PO CAPS: 2.0000 mg | ORAL_CAPSULE | Freq: Four times a day (QID) | ORAL | Status: DC | PRN

## 2017-08-14 MED ORDER — PREDNISONE 50 MG PO TABS
50.0000 mg | ORAL_TABLET | Freq: Every day | ORAL | Status: DC
  Administered 2017-08-14 – 2017-08-15 (×2): 50 mg via ORAL
  Filled 2017-08-14 (×2): qty 1

## 2017-08-14 NOTE — Progress Notes (Signed)
Patient's liver enzymes have decreased, and her pain was improving. Would recommend obtaining MRCP at this time to evaluate if stone has passed.   If MRCP still shows choledocholithiasis ERCP would be indicated.   Dr. Rosalie Gums, the GI physician on call (who does not perform ERCPs) at [facility] stated that he discussed the patient with the GI physicians at [facility] who perform ERCPs. He will contact us over the week to let us know what day the ERCP can be scheduled at [facility]. He has my direct cell number and said he will let me know on Monday about the ERCP scheduling.   Plavix is usually held for 3-5 days prior to an ERCP and since patient received last done on Saturday, will likely need to wait atleast till Tuesday, depending on the interventional endoscopist schedule and plan at [facility].  If [facility] does not accept patient for inpatient transfer, round trip transport for the patient will need to be obtained on the day the ERCP is scheduled at [facility].

## 2017-08-14 NOTE — Progress Notes (Signed)
[facility] [place] NAME: Amy [name]    MR#:  [id number]  DATE OF BIRTH:  Oct 27, 1935  SUBJECTIVE:  CHIEF COMPLAINT:   Chief Complaint  Patient presents with  . Abdominal Pain    RUQ   No abdominal pain.  No nausea or vomiting.  Diarrhea has resolved.  Afebrile.  REVIEW OF SYSTEMS:    Review of Systems  Constitutional: Negative for chills and fever.  HENT: Negative for sore throat.   Eyes: Negative for blurred vision, double vision and pain.  Respiratory: Negative for cough, hemoptysis, shortness of breath and wheezing.   Cardiovascular: Negative for chest pain, palpitations, orthopnea and leg swelling.  Gastrointestinal: Negative for abdominal pain, constipation, diarrhea, heartburn, nausea and vomiting.  Genitourinary: Negative for dysuria and hematuria.  Musculoskeletal: Negative for back pain and joint pain.  Skin: Negative for rash.  Neurological: Negative for sensory change, speech change, focal weakness and headaches.  Endo/Heme/Allergies: Does not bruise/bleed easily.  Psychiatric/Behavioral: Negative for depression. The patient is not nervous/anxious.    DRUG ALLERGIES:   Allergies  Allergen Reactions  . Fentanyl Other (See Comments)    urine retention  . Penicillins Itching, Rash and Other (See Comments)    Has patient had a PCN reaction causing immediate rash, facial/tongue/throat swelling, SOB or lightheadedness with hypotension: Yes Has patient had a PCN reaction causing severe rash involving mucus membranes or skin necrosis: No Has patient had a PCN reaction that required hospitalization: No Has patient had a PCN reaction occurring within the last 10 years: Yes If all of the above answers are "NO", then may proceed with Cephalosporin use.    VITALS:  Blood pressure (!) 153/92, pulse 90, temperature 97.8 F (36.6 C), temperature source Oral, resp. rate (!) 24, height _0  (1.575 m), weight 83 kg (182 lb 15.7  oz), SpO2 96 %.  PHYSICAL EXAMINATION:   Physical Exam  GENERAL:  82 y.o.-year-old patient lying in the bed with no acute distress.  EYES: Pupils equal, round, reactive to light and accommodation. No scleral icterus. Extraocular muscles intact.  HEENT: Head atraumatic, normocephalic. Oropharynx and nasopharynx clear.  NECK:  Supple, no jugular venous distention. No thyroid enlargement, no tenderness.  LUNGS: Normal breath sounds bilaterally, no wheezing, rales, rhonchi. No use of accessory muscles of respiration.  CARDIOVASCULAR: S1, S2 normal. No murmurs, rubs, or gallops.  ABDOMEN: Soft, nontender, nondistended. Bowel sounds present. No organomegaly or mass.  EXTREMITIES: No cyanosis, clubbing or edema b/l.    NEUROLOGIC: Cranial nerves II through XII are intact. No focal Motor or sensory deficits b/l.   PSYCHIATRIC: The patient is pleasantly confused. SKIN: No obvious rash, lesion, or ulcer.   LABORATORY PANEL:   CBC Recent Labs  Lab 08/13/17 0348  WBC 6.8  HGB 11.4*  HCT 34.4*  PLT 191   ------------------------------------------------------------------------------------------------------------------ Chemistries  Recent Labs  Lab 08/14/17 0423  NA 139  K 4.4  CL 107  CO2 25  GLUCOSE 106*  BUN 11  CREATININE 1.63*  CALCIUM 8.7*  AST 79*  ALT 55*  ALKPHOS 281*  BILITOT 1.5*   ------------------------------------------------------------------------------------------------------------------  Cardiac Enzymes No results for input(s): TROPONINI in the last 168 hours. ------------------------------------------------------------------------------------------------------------------  RADIOLOGY:  Ct Abdomen Pelvis Wo Contrast  Result Date: 08/12/2017 CLINICAL DATA:  Right upper quadrant pain since Monday morning. Left oophorectomy a few years ago. Nausea and vomiting with decreased appetite. EXAM: CT CHEST, ABDOMEN AND PELVIS WITHOUT CONTRAST TECHNIQUE: Multidetector  CT imaging of  the chest, abdomen and pelvis was performed following the standard protocol without IV contrast. COMPARISON:  Chest CT 02/16/2017 FINDINGS: CT CHEST FINDINGS Cardiovascular: Heart size is within normal limits with trace pericardial effusion anteriorly. Left main and three-vessel coronary arteriosclerosis is noted. Unremarkable noncontrast appearance of the pulmonary vasculature. Mediastinum/Nodes: Normal thyroid gland. Normal branch pattern of the great vessels with moderate atherosclerosis at the origins of the great vessels. No aortic aneurysm. There is mild-to-moderate atherosclerosis along the arch and descending portion. Trachea is midline. Patent mainstem bronchi. Esophagus is unremarkable. Lungs/Pleura: Minimal apical scarring on the right. Pleural-based nodular opacity in the anterior right upper lobe measuring 7.2 x 4 mm versus 6.6 x 4.9 mm previously is noted. Stable 4 mm opacity in the medial left upper lobe is also unchanged. Mild centrilobular emphysema without acute pneumonic consolidation, effusion or pneumothorax. Bibasilar dependent atelectasis. Mild peribronchial thickening bilaterally. Subsegmental atelectasis and/or scarring is seen the left lower lobe and lingula. No effusion or pneumothorax. Musculoskeletal: No chest wall mass or suspicious bone lesions identified. CT ABDOMEN PELVIS FINDINGS Hepatobiliary: No focal liver abnormality is seen. Status post cholecystectomy. Dilatation of the common bowel duct to 17 mm is identified, previously 10 mm with a subtle hyperdensity measuring 9 mm near the ampulla, previously 6 mm. Although findings could simply represent partial volume averaging of intraluminal contrast within the duodenum, given interval increase in dilatation of the common bowel duct since prior exam and patient's right upper quadrant pain, the possibility of choledocholithiasis is raised. Pancreas: Normal without pancreatic duct dilatation. Spleen: Normal  Adrenals/Urinary Tract: Normal bilateral adrenal glands. Status post left nephrectomy with compensatory hypertrophy of the right kidney. A large exophytic simple cyst is noted off the lateral aspect of the right kidney measuring 8.4 x 8.2 x 8.7 cm. Smaller simple cysts are also noted off the right kidney in addition to a complex appearing hyperdense cyst posteriorly without worrisome appearing features measuring 1.1 cm in diameter. This may represent a proteinaceous or hemorrhagic cyst. Further characterization however is not possible this unenhanced study. Renovascular calcifications are present within the medullary compartment of the right kidney. No obstructive uropathy is seen. No hydroureteronephrosis. The urinary bladder is unremarkable for degree of distention. No calculus or focal mural thickening is noted with respect to the bladder. Stomach/Bowel: Small hiatal hernia. Contrast filled stomach with normal small bowel rotation. No small bowel obstruction or inflammation. Contrast reaches the colon. There is colonic diverticulosis without acute diverticulitis. The appendix is not visualized but no pericecal inflammation is noted. Vascular/Lymphatic: Ectasias of the infrarenal abdominal aorta with moderate degree of aortoiliac and branch vessel atherosclerosis. No lymphadenopathy. Reproductive: Status post hysterectomy.  No adnexal mass. Other: No free air nor free fluid. Ventral supraumbilical fat containing hernia. Musculoskeletal: Moderate burst fracture involving the superior endplate of L1 with 3 mm retropulsion noted contributing to mild central canal stenosis. IMPRESSION: Chest CT: 1. Left main and three-vessel coronary arteriosclerosis. 2. No active pulmonary disease. Mild centrilobular emphysema. Stable appearing pleural-based nodular opacity in the anterior aspect of the right upper lobe measuring 5.6 mm in average. Stable 4 mm medial left upper lobe pleural-based opacity is unchanged. 3. Aortic  atherosclerosis. Abdomen and pelvic CT: 1. 9 mm hyperdensity along the expected location of the distal common bile duct near the ampulla with dilatation of the CBD to 17 mm, both increased in appearance since prior. Although findings may simply represent imaging of intraluminal contrast, given patient's right upper quadrant pain and interval increase in CBD caliber, choledocholithiasis  is raised. The patient is status post cholecystectomy. 2. Status post left nephrectomy. Simple renal cysts of the right kidney are noted with 1 hyperdense complex appearing lesion possibly representing a hemorrhagic or proteinaceous cyst measuring approximate 1.1 cm. No worrisome features are identified with respect of this complex lesion. 3. Colonic diverticulosis without acute diverticulitis. No bowel obstruction. 4. Remote appearing burst fracture involving the superior endplate of L1 with 3 mm of retropulsion causing slight central canal stenosis. Electronically Signed   By: Ashley Royalty M.D.   On: 08/12/2017 19:25   Ct Chest Wo Contrast  Result Date: 08/12/2017 CLINICAL DATA:  Right upper quadrant pain since Monday morning. Left oophorectomy a few years ago. Nausea and vomiting with decreased appetite. EXAM: CT CHEST, ABDOMEN AND PELVIS WITHOUT CONTRAST TECHNIQUE: Multidetector CT imaging of the chest, abdomen and pelvis was performed following the standard protocol without IV contrast. COMPARISON:  Chest CT 02/16/2017 FINDINGS: CT CHEST FINDINGS Cardiovascular: Heart size is within normal limits with trace pericardial effusion anteriorly. Left main and three-vessel coronary arteriosclerosis is noted. Unremarkable noncontrast appearance of the pulmonary vasculature. Mediastinum/Nodes: Normal thyroid gland. Normal branch pattern of the great vessels with moderate atherosclerosis at the origins of the great vessels. No aortic aneurysm. There is mild-to-moderate atherosclerosis along the arch and descending portion. Trachea is  midline. Patent mainstem bronchi. Esophagus is unremarkable. Lungs/Pleura: Minimal apical scarring on the right. Pleural-based nodular opacity in the anterior right upper lobe measuring 7.2 x 4 mm versus 6.6 x 4.9 mm previously is noted. Stable 4 mm opacity in the medial left upper lobe is also unchanged. Mild centrilobular emphysema without acute pneumonic consolidation, effusion or pneumothorax. Bibasilar dependent atelectasis. Mild peribronchial thickening bilaterally. Subsegmental atelectasis and/or scarring is seen the left lower lobe and lingula. No effusion or pneumothorax. Musculoskeletal: No chest wall mass or suspicious bone lesions identified. CT ABDOMEN PELVIS FINDINGS Hepatobiliary: No focal liver abnormality is seen. Status post cholecystectomy. Dilatation of the common bowel duct to 17 mm is identified, previously 10 mm with a subtle hyperdensity measuring 9 mm near the ampulla, previously 6 mm. Although findings could simply represent partial volume averaging of intraluminal contrast within the duodenum, given interval increase in dilatation of the common bowel duct since prior exam and patient's right upper quadrant pain, the possibility of choledocholithiasis is raised. Pancreas: Normal without pancreatic duct dilatation. Spleen: Normal Adrenals/Urinary Tract: Normal bilateral adrenal glands. Status post left nephrectomy with compensatory hypertrophy of the right kidney. A large exophytic simple cyst is noted off the lateral aspect of the right kidney measuring 8.4 x 8.2 x 8.7 cm. Smaller simple cysts are also noted off the right kidney in addition to a complex appearing hyperdense cyst posteriorly without worrisome appearing features measuring 1.1 cm in diameter. This may represent a proteinaceous or hemorrhagic cyst. Further characterization however is not possible this unenhanced study. Renovascular calcifications are present within the medullary compartment of the right kidney. No obstructive  uropathy is seen. No hydroureteronephrosis. The urinary bladder is unremarkable for degree of distention. No calculus or focal mural thickening is noted with respect to the bladder. Stomach/Bowel: Small hiatal hernia. Contrast filled stomach with normal small bowel rotation. No small bowel obstruction or inflammation. Contrast reaches the colon. There is colonic diverticulosis without acute diverticulitis. The appendix is not visualized but no pericecal inflammation is noted. Vascular/Lymphatic: Ectasias of the infrarenal abdominal aorta with moderate degree of aortoiliac and branch vessel atherosclerosis. No lymphadenopathy. Reproductive: Status post hysterectomy.  No adnexal mass. Other: No  free air nor free fluid. Ventral supraumbilical fat containing hernia. Musculoskeletal: Moderate burst fracture involving the superior endplate of L1 with 3 mm retropulsion noted contributing to mild central canal stenosis. IMPRESSION: Chest CT: 1. Left main and three-vessel coronary arteriosclerosis. 2. No active pulmonary disease. Mild centrilobular emphysema. Stable appearing pleural-based nodular opacity in the anterior aspect of the right upper lobe measuring 5.6 mm in average. Stable 4 mm medial left upper lobe pleural-based opacity is unchanged. 3. Aortic atherosclerosis. Abdomen and pelvic CT: 1. 9 mm hyperdensity along the expected location of the distal common bile duct near the ampulla with dilatation of the CBD to 17 mm, both increased in appearance since prior. Although findings may simply represent imaging of intraluminal contrast, given patient's right upper quadrant pain and interval increase in CBD caliber, choledocholithiasis is raised. The patient is status post cholecystectomy. 2. Status post left nephrectomy. Simple renal cysts of the right kidney are noted with 1 hyperdense complex appearing lesion possibly representing a hemorrhagic or proteinaceous cyst measuring approximate 1.1 cm. No worrisome features  are identified with respect of this complex lesion. 3. Colonic diverticulosis without acute diverticulitis. No bowel obstruction. 4. Remote appearing burst fracture involving the superior endplate of L1 with 3 mm of retropulsion causing slight central canal stenosis. Electronically Signed   By: Ashley Royalty M.D.   On: 08/12/2017 19:25     ASSESSMENT AND PLAN:   *Choledocholithiasis without cholangitis Bilirubin and alk phos trending down.  Will check MRCP. Appreciate GI input.  *E. coli  UTI.  On IV antibiotics.  *Hypertension.  Home medications.  *Peripheral arterial disease.  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  No severe symptoms of peripheral arterial disease.  No recent stent placement.  Will hold Plavix.  *CKD stage III is stable  *Dementia.  Monitor for inpatient delirium.  All the records are reviewed and case discussed with Care Management/Social Worker Management plans discussed with the patient, family and they are in agreement.  CODE STATUS: FULL CODE  DVT Prophylaxis: SCDs  TOTAL TIME TAKING CARE OF THIS PATIENT: 30 minutes.   POSSIBLE D/C IN 2-3 DAYS, DEPENDING ON CLINICAL CONDITION.  Leia Alf Ryen Rhames M.D on 08/14/2017 at 12:47 PM  Between 7am to 6pm - Pager - [phone]  After 6pm go to [email or web address] - password [email or web address] [facility]  Office  [phone]  CC: Primary care physician; Arnetha Courser, MD  Note: This dictation was prepared with Dragon dictation along with smaller phrase technology. Any transcriptional errors that result from this process are unintentional.

## 2017-08-14 NOTE — H&P (View-Only) (Signed)
Patient's liver enzymes have decreased, and her pain was improving. Would recommend obtaining MRCP at this time to evaluate if stone has passed.   If MRCP still shows choledocholithiasis ERCP would be indicated.   Dr. Rosalie Gums, the GI physician on call (who does not perform ERCPs) at [facility] stated that he discussed the patient with the GI physicians at [facility] who perform ERCPs. He will contact us over the week to let us know what day the ERCP can be scheduled at [facility]. He has my direct cell number and said he will let me know on Monday about the ERCP scheduling.   Plavix is usually held for 3-5 days prior to an ERCP and since patient received last done on Saturday, will likely need to wait atleast till Tuesday, depending on the interventional endoscopist schedule and plan at [facility].  If [facility] does not accept patient for inpatient transfer, round trip transport for the patient will need to be obtained on the day the ERCP is scheduled at [facility].

## 2017-08-14 NOTE — Plan of Care (Signed)
  Problem: Clinical Measurements: Goal: Diagnostic test results will improve Outcome: Progressing  -Pt scheduled for MR Abd this shift at 1615 (NPO for 3 hrs prior) Problem: Nutrition: Goal: Adequate nutrition will be maintained Outcome: Progressing  -Pt's intake adequate without pain to date this shift Problem: Safety: Goal: Ability to remain free from injury will improve Outcome: Progressing  -Pt remains on High Fall Risks

## 2017-08-15 ENCOUNTER — Encounter: Payer: Self-pay | Admitting: Family Medicine

## 2017-08-15 DIAGNOSIS — R1011 Right upper quadrant pain: Secondary | ICD-10-CM

## 2017-08-15 LAB — URINE CULTURE

## 2017-08-15 LAB — COMPREHENSIVE METABOLIC PANEL
ALK PHOS: 244 U/L — AB (ref 38–126)
ALT: 47 U/L (ref 14–54)
ANION GAP: 10 (ref 5–15)
AST: 65 U/L — ABNORMAL HIGH (ref 15–41)
Albumin: 2.6 g/dL — ABNORMAL LOW (ref 3.5–5.0)
BUN: 10 mg/dL (ref 6–20)
CALCIUM: 8.5 mg/dL — AB (ref 8.9–10.3)
CO2: 23 mmol/L (ref 22–32)
Chloride: 104 mmol/L (ref 101–111)
Creatinine, Ser: 1.62 mg/dL — ABNORMAL HIGH (ref 0.44–1.00)
GFR calc non Af Amer: 29 mL/min — ABNORMAL LOW (ref >60)
GFR, EST AFRICAN AMERICAN: 34 mL/min — AB (ref >60)
Glucose, Bld: 180 mg/dL — ABNORMAL HIGH (ref 65–99)
Potassium: 3.9 mmol/L (ref 3.5–5.1)
SODIUM: 137 mmol/L (ref 135–145)
Total Bilirubin: 1.3 mg/dL — ABNORMAL HIGH (ref 0.3–1.2)
Total Protein: 6.6 g/dL (ref 6.5–8.1)

## 2017-08-15 MED ORDER — CEPHALEXIN 250 MG PO CAPS: 250.0000 mg | ORAL_CAPSULE | Freq: Three times a day (TID) | ORAL | 0 refills | Status: AC

## 2017-08-15 NOTE — Progress Notes (Signed)
Amy Antigua, MD [address] [place], [place], [place], [address] [id number], [place], [place], [place], [address] Phone: [phone]  Fax: [phone]   Subjective:  Pt. Reports abdominal pain is completely resolved.  She is tolerating solid food diet without difficulty.  No nausea or vomiting.  No fever or chills.  Objective: Vital signs in last 24 hours: Vitals:   08/15/17 0122 08/15/17 0350 08/15/17 0757 08/15/17 0944  BP:  132/69  130/69  Pulse: 98 87  (!) 104  Resp: 18 16  18   Temp:  98.2 F (36.8 C)    TempSrc:  Oral    SpO2: 91% 92% 94% 94%  Weight:      Height:       Weight change:   Intake/Output Summary (Last 24 hours) at 08/15/2017 1143 Last data filed at 08/14/2017 1146 Gross per 24 hour  Intake 240 ml  Output -  Net 240 ml     Exam: Cardiac: +S1, +S2, RRR, No edema Pulm: CTA b/l, Normal Resp Effort Abd: Soft, NT/ND, No HSM Skin: Warm, no rashes Neck: Supple, Trachea midline   Lab Results: Lab Results  Component Value Date   WBC 6.8 08/13/2017   HGB 11.4 (L) 08/13/2017   HCT 34.4 (L) 08/13/2017   MCV 91.5 08/13/2017   PLT 191 08/13/2017   Micro Results: Recent Results (from the past 240 hour(s))  Urine Culture     Status: Abnormal   Collection Time: 08/12/17  6:07 PM  Result Value Ref Range Status   Specimen Description   Final    URINE, RANDOM Performed at [facility], [address] [place], [place] [address]    Special Requests   Final    NONE Performed at [facility], [address] [place], [place] [address]    Culture 80,000 COLONIES/mL ESCHERICHIA COLI (A)  Final   Report Status 08/15/2017 FINAL  Final   Organism ID, Bacteria ESCHERICHIA COLI (A)  Final      Susceptibility   Escherichia coli - MIC*    AMPICILLIN 8 SENSITIVE Sensitive     CEFAZOLIN <=4 SENSITIVE Sensitive     CEFTRIAXONE <=1 SENSITIVE Sensitive     CIPROFLOXACIN <=0.25 SENSITIVE Sensitive     GENTAMICIN <=1 SENSITIVE  Sensitive     IMIPENEM <=0.25 SENSITIVE Sensitive     NITROFURANTOIN <=16 SENSITIVE Sensitive     TRIMETH/SULFA <=20 SENSITIVE Sensitive     AMPICILLIN/SULBACTAM 4 SENSITIVE Sensitive     PIP/TAZO <=4 SENSITIVE Sensitive     Extended ESBL NEGATIVE Sensitive     * 80,000 COLONIES/mL ESCHERICHIA COLI  Culture, blood (routine x 2)     Status: None (Preliminary result)   Collection Time: 08/12/17 10:38 PM  Result Value Ref Range Status   Specimen Description BLOOD BLOOD RIGHT HAND  Final   Special Requests   Final    BOTTLES DRAWN AEROBIC AND ANAEROBIC Blood Culture adequate volume   Culture   Final    NO GROWTH 3 DAYS Performed at [facility], [address] [place],  [address]    Report Status PENDING  Incomplete  Culture, blood (routine x 2)     Status: None (Preliminary result)   Collection Time: 08/12/17 10:48 PM  Result Value Ref Range Status   Specimen Description BLOOD BLOOD LEFT HAND  Final   Special Requests   Final    BOTTLES DRAWN AEROBIC AND ANAEROBIC Blood Culture adequate volume   Culture   Final  NO GROWTH 3 DAYS Performed at [facility], [address] [place], [place] [address]    Report Status PENDING  Incomplete  Gastrointestinal Panel by PCR , Stool     Status: None   Collection Time: 08/13/17 10:57 AM  Result Value Ref Range Status   Campylobacter species NOT DETECTED NOT DETECTED Final   Plesimonas shigelloides NOT DETECTED NOT DETECTED Final   Salmonella species NOT DETECTED NOT DETECTED Final   Yersinia enterocolitica NOT DETECTED NOT DETECTED Final   Vibrio species NOT DETECTED NOT DETECTED Final   Vibrio cholerae NOT DETECTED NOT DETECTED Final   Enteroaggregative E coli (EAEC) NOT DETECTED NOT DETECTED Final   Enteropathogenic E coli (EPEC) NOT DETECTED NOT DETECTED Final   Enterotoxigenic E coli (ETEC) NOT DETECTED NOT DETECTED Final   Shiga like toxin producing E coli (STEC) NOT DETECTED NOT DETECTED Final    Shigella/Enteroinvasive E coli (EIEC) NOT DETECTED NOT DETECTED Final   Cryptosporidium NOT DETECTED NOT DETECTED Final   Cyclospora cayetanensis NOT DETECTED NOT DETECTED Final   Entamoeba histolytica NOT DETECTED NOT DETECTED Final   Giardia lamblia NOT DETECTED NOT DETECTED Final   Adenovirus F40/41 NOT DETECTED NOT DETECTED Final   Astrovirus NOT DETECTED NOT DETECTED Final   Norovirus GI/GII NOT DETECTED NOT DETECTED Final   Rotavirus A NOT DETECTED NOT DETECTED Final   Sapovirus (I, II, IV, and V) NOT DETECTED NOT DETECTED Final    Comment: Performed at [facility], [address] [place], [place] [address]  C difficile quick scan w PCR reflex     Status: None   Collection Time: 08/13/17 10:57 AM  Result Value Ref Range Status   C Diff antigen NEGATIVE NEGATIVE Final   C Diff toxin NEGATIVE NEGATIVE Final   C Diff interpretation No C. difficile detected.  Final    Comment: Performed at [facility], [address] [place], [place] [address]   Studies/Results: Mr Abdomen Mrcp TF Contrast  Result Date: 08/15/2017 CLINICAL DATA:  Inpatient. History of left nephrectomy in 2016 for renal cancer. Right upper quadrant abdominal pain. Possible choledocholithiasis on recent CT study. EXAM: MRI ABDOMEN WITHOUT CONTRAST  (INCLUDING MRCP) TECHNIQUE: Multiplanar multisequence MR imaging of the abdomen was performed. Heavily T2-weighted images of the biliary and pancreatic ducts were obtained, and three-dimensional MRCP images were rendered by post processing. COMPARISON:  08/12/2017 unenhanced CT abdomen/pelvis. FINDINGS: Lower chest: No acute abnormality at the lung bases. Hepatobiliary: Normal liver size and configuration. Moderate diffuse hepatic steatosis. No definite liver surface irregularity. No liver mass. Cholecystectomy. Mild diffuse intrahepatic biliary ductal dilatation, not appreciably changed from the recent CT. Dilated common bile duct, 13 mm diameter, mildly  decreased from 17 mm on the recent CT. There is a low signal intensity 9 mm round filling defect in the mid-to-upper common bile duct, compatible with a choledocholith. No additional CBD filling defects. No biliary strictures. Pancreas: No pancreatic mass or duct dilation.  No pancreas divisum. Spleen: Normal size. No mass. Adrenals/Urinary Tract: Normal adrenals. Status post left nephrectomy. No mass or fluid collection in the left nephrectomy bed. No right hydronephrosis. There is a 1.2 cm renal cortical lesion in posterior lower right kidney (series 15/image 43), which demonstrates minimal T1 heterogeneous hyperintensity and mildly heterogeneous T2 hyperintensity, incompletely characterized on this noncontrast MRI study. There are several additional homogeneous T2 hyperintense renal lesions in the right kidney, largest an exophytic 8.2 cm lesion in the lateral interpolar right kidney, most compatible with simple  renal cysts. Stomach/Bowel: Grossly normal stomach. Visualized small and large bowel is normal caliber, with no bowel wall thickening. Vascular/Lymphatic: Atherosclerotic abdominal aorta with ectatic 2.7 cm infrarenal abdominal aorta. No pathologically enlarged lymph nodes in the abdomen. Other: No abdominal ascites or focal fluid collection. Musculoskeletal: No aggressive appearing focal osseous lesions. Redemonstration of chronic appearing marked T12 vertebral compression fracture, better seen on the recent CT study. IMPRESSION: 1. Solitary 9 mm choledocholith in the mid to upper common bile duct. Mild diffuse intrahepatic biliary ductal dilatation with dilated CBD (13 mm diameter). 2. Indeterminate small 1.2 cm renal cortical mass in the posterior lower right kidney, incompletely characterized on this noncontrast MRI study, renal neoplasm not excluded. Recommend attention on follow-up MRI (preferred) or CT abdomen without and with IV contrast in 6 months. 3. Ectatic 2.7 cm infrarenal abdominal aorta.  Ectatic abdominal aorta at risk for aneurysm development. Recommend followup by ultrasound in 5 years. This recommendation follows ACR consensus guidelines: White Paper of the ACR Incidental Findings Committee II on Vascular Findings. J Am Coll Radiol 2013; 10:789-794. 4. Moderate diffuse hepatic steatosis. Electronically Signed   By: Ilona Sorrel M.D.   On: 08/15/2017 09:02   Mr 3d Recon At Scanner  Result Date: 08/15/2017 CLINICAL DATA:  Inpatient. History of left nephrectomy in 2016 for renal cancer. Right upper quadrant abdominal pain. Possible choledocholithiasis on recent CT study. EXAM: MRI ABDOMEN WITHOUT CONTRAST  (INCLUDING MRCP) TECHNIQUE: Multiplanar multisequence MR imaging of the abdomen was performed. Heavily T2-weighted images of the biliary and pancreatic ducts were obtained, and three-dimensional MRCP images were rendered by post processing. COMPARISON:  08/12/2017 unenhanced CT abdomen/pelvis. FINDINGS: Lower chest: No acute abnormality at the lung bases. Hepatobiliary: Normal liver size and configuration. Moderate diffuse hepatic steatosis. No definite liver surface irregularity. No liver mass. Cholecystectomy. Mild diffuse intrahepatic biliary ductal dilatation, not appreciably changed from the recent CT. Dilated common bile duct, 13 mm diameter, mildly decreased from 17 mm on the recent CT. There is a low signal intensity 9 mm round filling defect in the mid-to-upper common bile duct, compatible with a choledocholith. No additional CBD filling defects. No biliary strictures. Pancreas: No pancreatic mass or duct dilation.  No pancreas divisum. Spleen: Normal size. No mass. Adrenals/Urinary Tract: Normal adrenals. Status post left nephrectomy. No mass or fluid collection in the left nephrectomy bed. No right hydronephrosis. There is a 1.2 cm renal cortical lesion in posterior lower right kidney (series 15/image 43), which demonstrates minimal T1 heterogeneous hyperintensity and mildly  heterogeneous T2 hyperintensity, incompletely characterized on this noncontrast MRI study. There are several additional homogeneous T2 hyperintense renal lesions in the right kidney, largest an exophytic 8.2 cm lesion in the lateral interpolar right kidney, most compatible with simple renal cysts. Stomach/Bowel: Grossly normal stomach. Visualized small and large bowel is normal caliber, with no bowel wall thickening. Vascular/Lymphatic: Atherosclerotic abdominal aorta with ectatic 2.7 cm infrarenal abdominal aorta. No pathologically enlarged lymph nodes in the abdomen. Other: No abdominal ascites or focal fluid collection. Musculoskeletal: No aggressive appearing focal osseous lesions. Redemonstration of chronic appearing marked T12 vertebral compression fracture, better seen on the recent CT study. IMPRESSION: 1. Solitary 9 mm choledocholith in the mid to upper common bile duct. Mild diffuse intrahepatic biliary ductal dilatation with dilated CBD (13 mm diameter). 2. Indeterminate small 1.2 cm renal cortical mass in the posterior lower right kidney, incompletely characterized on this noncontrast MRI study, renal neoplasm not excluded. Recommend attention on follow-up MRI (preferred) or CT abdomen without  and with IV contrast in 6 months. 3. Ectatic 2.7 cm infrarenal abdominal aorta. Ectatic abdominal aorta at risk for aneurysm development. Recommend followup by ultrasound in 5 years. This recommendation follows ACR consensus guidelines: White Paper of the ACR Incidental Findings Committee II on Vascular Findings. J Am Coll Radiol 2013; 10:789-794. 4. Moderate diffuse hepatic steatosis. Electronically Signed   By: Ilona Sorrel M.D.   On: 08/15/2017 09:02   Medications:  Scheduled Meds: . atorvastatin  20 mg Oral QHS  . citalopram  20 mg Oral QHS  . donepezil  5 mg Oral QHS  . enoxaparin (LOVENOX) injection  30 mg Subcutaneous Q24H  . gabapentin  300 mg Oral TID  . ipratropium-albuterol  3 mL Nebulization  Q6H  . pantoprazole  40 mg Oral Daily  . predniSONE  50 mg Oral Q breakfast  . QUEtiapine  50 mg Oral QHS   Continuous Infusions: . cefTRIAXone (ROCEPHIN) IVPB 2 gram/100 mL NS (Mini-Bag Plus) 2 g (08/15/17 1125)   PRN Meds:.acetaminophen **OR** acetaminophen, HYDROcodone-acetaminophen, loperamide, ondansetron **OR** ondansetron (ZOFRAN) IV   Assessment: Principal Problem:   Choledocholithiasis Active Problems:   Anxiety   GERD (gastroesophageal reflux disease)   CKD (chronic kidney disease) stage 4, GFR 15-29 ml/min (HCC)   HTN (hypertension)   UTI (urinary tract infection)    Plan: Patient's abdominal pain has completely resolved at this time MRCP that was done last night, (done as liver enzymes were decreasing, and to evaluate if CBD stones have passed) showed 9 mm round filling defect in the common bile duct, compatible with choledocholithiasis. Patient does not have a gallbladder  The above was discussed with Dr. Paulita Fujita at [facility], and he states that due to patient's Plavix that was being last Saturday, the earliest procedure can be done would be Thursday.  He states that the patient is clinically stable, he is able to do this as an outpatient as well.  Patient does not have any signs of cholangitis at this time.  No fevers, right upper quadrant pain has completely resolved.  No jaundice.  If no other indication for inpatient stay, patient can follow-up with Dr. Paulita Fujita as an outpatient Dr. Paulita Fujita has the patient's information and was able to access her chart from [facility].  I have given him the patient's phone number as well.  His staff will contact the patient directly to schedule the ERCP this week  Please continue to hold Plavix on discharge Patient should follow-up with her primary care doctor within 1 week, to resume Plavix  Above discussed with primary physician, Dr. Darvin Neighbours.   LOS: 2 days   Amy Antigua, MD 08/15/2017, 11:43 AM

## 2017-08-15 NOTE — Progress Notes (Signed)
Pt is being discharged home. Discharge papers given and explained to pt and spouse, spouse verbalized understanding. Meds and f/u appointments reviewed. RX given.

## 2017-08-16 ENCOUNTER — Other Ambulatory Visit: Payer: Self-pay | Admitting: *Deleted

## 2017-08-16 ENCOUNTER — Encounter: Payer: Self-pay | Admitting: *Deleted

## 2017-08-16 ENCOUNTER — Ambulatory Visit: Payer: Medicare HMO

## 2017-08-16 NOTE — Patient Outreach (Signed)
[facility]) Care Management  08/16/2017  Amy [name] 08-21-1935 [id number]    Phone call to patient's spouse to review community resources to assist with patient's care. Per patient's spouse, Patient has a sister, who comes when she can to assist with care otherwise he is her main caregiver. Community resources reviewed to assist spouse in managing patient's chronic condition including area day programs, private duty care and facility care. Attending the [place] Care[facility]iver Resource fair through [place] [facility] also discussed for additional resource and support  information on 09/08/17. Patient's spouse declined reviewed resources due to financial reasons and concern that leaving her alone will increase her confusion. Per patient's spouse, he feels that he can handle just about everything accept aspects related to her ADL's.  Patient's spouse benefiting from the 30 day in home care program through [facility] discussed.  Patient's spouse agrees with this referral and will be completed today.   Plan: Patient to be referred to the 30 day in home care program through [employer].   Sheralyn Boatman [facility] Care Management [phone]

## 2017-08-16 NOTE — Patient Outreach (Signed)
[facility]) Care Management  08/16/2017  CHANIA KOCHANSKI 08/31/1935 [id number]  Telephone assessment  Transition of care   Chart reviewed :  Patient history includes : Diabetes, COPD, Alzheimer's,chronic pain syndrome, s/p left AKA,excessive falling , hx renal cancer  Recent hospital admission at [facility] 3/22- 3/25 Dx: RUQ pain , Choledocholithiasis, UTI   Incoming call from patient husband, requesting to cancel previous scheduled home visit for 3/28.  Mr.Capuano verified HIPAA information, she discussed patient recent hospital admission related to concern related to elevated liver enzymes . He discussed patient has follow up visit with GI doctor in [place] for a procedure related to gall stones on 3/28.  Mr.Mcalpine reports patient without complaint of abdominal pain, nausea, vomiting or fever and tolerating diet at home.   Mr.Greis discussed patient is currently resting in bed.  He reports he has picked antibiotic prescribed at discharge and verified patient is not taking Plavix in preparation of procedure this week and unsure when it will be restarted . Husband states patient also has a dental appointment on 4/1 and questions whether patient needs to be off plavix for that and will call that office.  Patient was recently discharged from hospital and all medications have been reviewed.   Discussed transition of care program with husband and weekly follow up he is agreeable.  Patient has PCP office visit scheduled in the next week.   Plan Will follow patient in transition of care program, with weekly outreaches next call in a week.  RNCM will send PCP barrier letter , and patient welcome letter.   THN CM Care Plan Problem One     Most Recent Value  Care Plan Problem One  High risk for readmission related to recent hospital admission for elevated liver enzymes, gall bladder problem   Role Documenting the Problem One  Care Management [place]  for Problem One  Active  THN Long Term Goal   Patient will not experience a hospital admission in the next 31 days   THN Long Term Goal Start Date  08/16/17  Interventions for Problem One Long Term Goal  Reviewed discharge instructions, advised importance of following instructions .   THN CM Short Term Goal #1   Patient will attend all medical appointments in the next 30 days   THN CM Short Term Goal #1 Start Date  08/16/17  Interventions for Short Term Goal #1  Advised regarding importance of attending all medical appointments , discussed transporation   THN CM Short Term Goal #2   Patient/ Husband will report no falls in the next 30 days   THN CM Short Term Goal #2 Start Date  08/16/17  Interventions for Short Term Goal #2  RNCM advised husband regarding making sure brakes on wheelchair locked prior to transfer, discussed lower height of bed and rationale.       Joylene Draft, RN, [place] Management Coordinator  [phone]- Mobile [phone]- Toll Free Main Office

## 2017-08-17 ENCOUNTER — Telehealth: Payer: Self-pay

## 2017-08-17 ENCOUNTER — Telehealth: Payer: Self-pay | Admitting: Family Medicine

## 2017-08-17 LAB — CULTURE, BLOOD (ROUTINE X 2)
CULTURE: NO GROWTH
Culture: NO GROWTH
SPECIAL REQUESTS: ADEQUATE
Special Requests: ADEQUATE

## 2017-08-17 NOTE — Telephone Encounter (Signed)
TOC #1. Called pt to f/u after d/c from [facility] on 08/15/17. Also wanted to confirm her hosp f/u appt w/ Suezanne Cheshire, FNP on 08/25/17 @ 11:20am. Discharge planning includes the following:  - ERCP with Dr. Paulita Fujita on 08/19/17 - Hold Plavix until f/u with Dr. Sanda Klein  As part of the [facility] f/u call, I am also wanting to discuss/review the above information with the pt to ensure all of the above has been taken care of. LVM requesting returned call.

## 2017-08-17 NOTE — Telephone Encounter (Signed)
Transition Care Management Follow-up phone call Date discharged:  Monday March 25th How have you been since you were released from the hospital?  Doing just fine since she got home Any concerns?  Yes, he's confused about the whole thing, what happened; they called them right back quick after going to the ER, ordered US and then CT scan; doing to have the ERCP as an outpatient; she got Plavix on Saturday morning and that has to be stopped before the procedure; 3 BMs in a row on Saturday morning; stool tests reviewed with husband by phone  Items reviewed Medicines:  Yes, Plavix on hold until the ERCP Allergies:  Yes, reviewed; no new allergies Dietary changes: Yes, heart healthy Referrals:  Yes, [place] Friday at 10 am and he knows NO plavix until then; he was told someone from hospital would call them about the ERCP and he has not heard anything yet, [place]; I'll have staff contact him with the information  Amy [name]*, please contact Amy [name] and make sure she is on the schedule for ERCP and have appropriate staff contact husband about when/where, if NPO, etc.)  Functional questionnaire I = independent D = dependent  ADLs Personal hygiene:  D Dressing:  D Eating:  I Maintaining continence:  D Transfers:  D  IADLs Basic communication skills:  I Transportation:  D Meal preparation:  D Shopping:  D Housework:  D Managing medicines:  D, she was two weeks short of alprazolam once and he took over Managing personal finances:  D  Confirmed importance and date/time of follow-up visit(s):  Yes Provider appointment booked with PCP:  Amy Cheshire, NP on April 4th at 11:20 am  Confirmed with patient that if condition begins to worsen, call PCP or go to the ER. Patient was given the office number [phone]) and encouraged to call back with questions or concerns:  yes  She has appt for labs and then appt with Dr. Mike [name]

## 2017-08-17 NOTE — Telephone Encounter (Signed)
At Dr. Delight Ovens request, I called Dr. Erlinda Hong office to determine their protocol re: notification of pts PRIOR to procedures. Spoke with [place] who confirmed a staff member from their office will call to inform both the husband and the pt of the date/location/arrival time of the ERCP. Also confirmed that the pt and her husband will be made aware of any prep prior to the procedure as well as [facility]'s protocol re: pt notification prior to procedures (typically takes place 24 hours prior to the procedure).  Called pt husband and assured him that Dr. Erlinda Hong office is aware of his concerns and that they will be calling to inform him and the pt of above information. Verbalized acceptance and understanding.

## 2017-08-18 ENCOUNTER — Encounter (HOSPITAL_COMMUNITY): Payer: Self-pay | Admitting: *Deleted

## 2017-08-18 ENCOUNTER — Other Ambulatory Visit: Payer: Self-pay

## 2017-08-18 ENCOUNTER — Other Ambulatory Visit: Payer: Self-pay | Admitting: Gastroenterology

## 2017-08-18 NOTE — Progress Notes (Signed)
Spoke with pt's husband, [name] for pre-op call. He states pt does not have a cardiac history. Pt is on Plavix because of PAD. Last dose of Plavix was 08/13/17. Pt is a type 2 diabetic. Not on medications. Last A1C was 5.9 on 06/15/17. He states he occasionally checks pt's fasting blood sugar and it's usually around the mid 90's.  Mr. Proctor will be bringing pt's meds with him. When I was trying to tell him which medications she could take in the AM, he got frustrated because he doesn't know how to spell or pronounce the names.   EKG - 08/12/17 CXR - 08/08/17

## 2017-08-19 ENCOUNTER — Ambulatory Visit (HOSPITAL_COMMUNITY): Payer: Medicare HMO | Admitting: Certified Registered Nurse Anesthetist

## 2017-08-19 ENCOUNTER — Ambulatory Visit (HOSPITAL_COMMUNITY)
Admission: RE | Admit: 2017-08-19 | Discharge: 2017-08-19 | Disposition: A | Discharge status: Home / Self Care | Payer: Medicare HMO | Source: Ambulatory Visit | Attending: Gastroenterology | Admitting: Gastroenterology

## 2017-08-19 ENCOUNTER — Encounter (HOSPITAL_COMMUNITY)
Admission: RE | Disposition: A | Discharge status: Home / Self Care | Payer: Self-pay | Source: Ambulatory Visit | Attending: Gastroenterology

## 2017-08-19 ENCOUNTER — Ambulatory Visit (HOSPITAL_COMMUNITY): Payer: Medicare HMO

## 2017-08-19 ENCOUNTER — Encounter (HOSPITAL_COMMUNITY): Payer: Self-pay | Admitting: *Deleted

## 2017-08-19 DIAGNOSIS — Z9114 Patient's other noncompliance with medication regimen: Secondary | ICD-10-CM | POA: Diagnosis not present

## 2017-08-19 DIAGNOSIS — Z9049 Acquired absence of other specified parts of digestive tract: Secondary | ICD-10-CM | POA: Insufficient documentation

## 2017-08-19 DIAGNOSIS — R748 Abnormal levels of other serum enzymes: Secondary | ICD-10-CM | POA: Insufficient documentation

## 2017-08-19 DIAGNOSIS — Z79899 Other long term (current) drug therapy: Secondary | ICD-10-CM | POA: Insufficient documentation

## 2017-08-19 DIAGNOSIS — R932 Abnormal findings on diagnostic imaging of liver and biliary tract: Secondary | ICD-10-CM | POA: Diagnosis not present

## 2017-08-19 DIAGNOSIS — F329 Major depressive disorder, single episode, unspecified: Secondary | ICD-10-CM | POA: Diagnosis not present

## 2017-08-19 DIAGNOSIS — R109 Unspecified abdominal pain: Secondary | ICD-10-CM | POA: Diagnosis not present

## 2017-08-19 DIAGNOSIS — F0390 Unspecified dementia without behavioral disturbance: Secondary | ICD-10-CM | POA: Diagnosis not present

## 2017-08-19 DIAGNOSIS — K838 Other specified diseases of biliary tract: Secondary | ICD-10-CM | POA: Diagnosis not present

## 2017-08-19 DIAGNOSIS — K219 Gastro-esophageal reflux disease without esophagitis: Secondary | ICD-10-CM | POA: Insufficient documentation

## 2017-08-19 DIAGNOSIS — Z87891 Personal history of nicotine dependence: Secondary | ICD-10-CM | POA: Insufficient documentation

## 2017-08-19 DIAGNOSIS — F419 Anxiety disorder, unspecified: Secondary | ICD-10-CM | POA: Diagnosis not present

## 2017-08-19 DIAGNOSIS — E1151 Type 2 diabetes mellitus with diabetic peripheral angiopathy without gangrene: Secondary | ICD-10-CM | POA: Insufficient documentation

## 2017-08-19 DIAGNOSIS — K8050 Calculus of bile duct without cholangitis or cholecystitis without obstruction: Secondary | ICD-10-CM

## 2017-08-19 DIAGNOSIS — I10 Essential (primary) hypertension: Secondary | ICD-10-CM | POA: Diagnosis not present

## 2017-08-19 DIAGNOSIS — J449 Chronic obstructive pulmonary disease, unspecified: Secondary | ICD-10-CM | POA: Diagnosis not present

## 2017-08-19 DIAGNOSIS — I739 Peripheral vascular disease, unspecified: Secondary | ICD-10-CM

## 2017-08-19 HISTORY — DX: Headache: R51

## 2017-08-19 HISTORY — PX: ERCP: SHX5425

## 2017-08-19 HISTORY — DX: Anemia, unspecified: D64.9

## 2017-08-19 HISTORY — DX: Unspecified osteoarthritis, unspecified site: M19.90

## 2017-08-19 HISTORY — DX: Headache, unspecified: R51.9

## 2017-08-19 LAB — GLUCOSE, CAPILLARY
GLUCOSE-CAPILLARY: 169 mg/dL — AB (ref 65–99)
Glucose-Capillary: 91 mg/dL (ref 65–99)

## 2017-08-19 SURGERY — ERCP, WITH INTERVENTION IF INDICATED
Anesthesia: General

## 2017-08-19 MED ORDER — ROCURONIUM BROMIDE 100 MG/10ML IV SOLN
INTRAVENOUS | Status: DC | PRN
  Administered 2017-08-19: 5 mg via INTRAVENOUS

## 2017-08-19 MED ORDER — INDOMETHACIN 50 MG RE SUPP
RECTAL | Status: AC
  Filled 2017-08-19: qty 2

## 2017-08-19 MED ORDER — FENTANYL CITRATE (PF) 250 MCG/5ML IJ SOLN
INTRAMUSCULAR | Status: DC | PRN
  Administered 2017-08-19 (×2): 50 ug via INTRAVENOUS

## 2017-08-19 MED ORDER — GLUCAGON HCL RDNA (DIAGNOSTIC) 1 MG IJ SOLR
INTRAMUSCULAR | Status: AC
  Filled 2017-08-19: qty 1

## 2017-08-19 MED ORDER — LACTATED RINGERS IV SOLN
INTRAVENOUS | Status: DC | PRN
  Administered 2017-08-19: 10:00:00 via INTRAVENOUS

## 2017-08-19 MED ORDER — PROPOFOL 10 MG/ML IV BOLUS
INTRAVENOUS | Status: DC | PRN
  Administered 2017-08-19: 20 mg via INTRAVENOUS
  Administered 2017-08-19: 90 mg via INTRAVENOUS
  Administered 2017-08-19: 20 mg via INTRAVENOUS

## 2017-08-19 MED ORDER — SUCCINYLCHOLINE CHLORIDE 20 MG/ML IJ SOLN
INTRAMUSCULAR | Status: DC | PRN
  Administered 2017-08-19: 120 mg via INTRAVENOUS

## 2017-08-19 MED ORDER — DEXAMETHASONE SODIUM PHOSPHATE 4 MG/ML IJ SOLN
INTRAMUSCULAR | Status: DC | PRN
  Administered 2017-08-19: 5 mg via INTRAVENOUS

## 2017-08-19 MED ORDER — GLUCAGON HCL RDNA (DIAGNOSTIC) 1 MG IJ SOLR
INTRAMUSCULAR | Status: DC | PRN
  Administered 2017-08-19 (×2): .5 mg via INTRAVENOUS

## 2017-08-19 MED ORDER — CIPROFLOXACIN IN D5W 400 MG/200ML IV SOLN
400.0000 mg | INTRAVENOUS | Status: AC
  Administered 2017-08-19: 400 mg via INTRAVENOUS

## 2017-08-19 MED ORDER — SODIUM CHLORIDE 0.9 % IV SOLN
INTRAVENOUS | Status: DC | PRN
  Administered 2017-08-19: 45 mL

## 2017-08-19 MED ORDER — LABETALOL HCL 5 MG/ML IV SOLN
INTRAVENOUS | Status: AC
  Filled 2017-08-19: qty 4

## 2017-08-19 MED ORDER — METOPROLOL TARTRATE 5 MG/5ML IV SOLN
INTRAVENOUS | Status: DC | PRN
  Administered 2017-08-19: 2.5 mg via INTRAVENOUS

## 2017-08-19 MED ORDER — CIPROFLOXACIN IN D5W 400 MG/200ML IV SOLN
INTRAVENOUS | Status: AC
  Filled 2017-08-19: qty 200

## 2017-08-19 MED ORDER — ONDANSETRON HCL 4 MG/2ML IJ SOLN
INTRAMUSCULAR | Status: DC | PRN
  Administered 2017-08-19: 4 mg via INTRAVENOUS

## 2017-08-19 MED ORDER — PHENYLEPHRINE HCL 10 MG/ML IJ SOLN
INTRAMUSCULAR | Status: DC | PRN
  Administered 2017-08-19: 40 ug via INTRAVENOUS
  Administered 2017-08-19 (×2): 80 ug via INTRAVENOUS
  Administered 2017-08-19: 120 ug via INTRAVENOUS
  Administered 2017-08-19 (×2): 40 ug via INTRAVENOUS

## 2017-08-19 MED ORDER — LIDOCAINE HCL (CARDIAC) 20 MG/ML IV SOLN
INTRAVENOUS | Status: DC | PRN
  Administered 2017-08-19: 60 mg via INTRAVENOUS

## 2017-08-19 MED ORDER — IOPAMIDOL (ISOVUE-300) INJECTION 61%
INTRAVENOUS | Status: AC
Start: 2017-08-19 — End: ?
  Filled 2017-08-19: qty 50

## 2017-08-19 MED ORDER — LACTATED RINGERS IV SOLN: INTRAVENOUS | Status: DC

## 2017-08-19 MED ORDER — LABETALOL HCL 5 MG/ML IV SOLN
5.0000 mg | INTRAVENOUS | Status: DC | PRN
  Administered 2017-08-19 (×3): 5 mg via INTRAVENOUS

## 2017-08-19 MED ORDER — INDOMETHACIN 50 MG RE SUPP
RECTAL | Status: DC | PRN
  Administered 2017-08-19 (×2): 50 mg via RECTAL

## 2017-08-19 NOTE — Transfer of Care (Signed)
Immediate Anesthesia Transfer of Care Note  Patient: Amy [name]  Procedure(s) Performed: ENDOSCOPIC RETROGRADE CHOLANGIOPANCREATOGRAPHY (ERCP) (N/A )  Patient Location: Endoscopy Unit  Anesthesia Type:General  Level of Consciousness: awake, alert , drowsy and patient cooperative  Airway & Oxygen Therapy: Patient Spontanous Breathing and Patient connected to face mask oxygen  Post-op Assessment: Report given to RN and Post -op Vital signs reviewed and stable  Post vital signs: Reviewed and stable  Last Vitals:  Vitals Value Taken Time  BP    Temp    Pulse    Resp    SpO2      Last Pain:  Vitals:   08/19/17 0835  TempSrc: Oral  PainSc: 0-No pain         Complications: No apparent anesthesia complications

## 2017-08-19 NOTE — Anesthesia Procedure Notes (Signed)
Procedure Name: Intubation Date/Time: 08/19/2017 10:23 AM Performed by: Glynda Jaeger, CRNA Pre-anesthesia Checklist: Patient identified, Patient being monitored, Timeout performed, Emergency Drugs available and Suction available Patient Re-evaluated:Patient Re-evaluated prior to induction Oxygen Delivery Method: Circle System Utilized Preoxygenation: Pre-oxygenation with 100% oxygen Induction Type: IV induction Ventilation: Mask ventilation without difficulty Laryngoscope Size: Mac and 3 Grade View: Grade I Tube type: Oral Tube size: 7.5 mm Number of attempts: 1 Airway Equipment and Method: Stylet Placement Confirmation: ETT inserted through vocal cords under direct vision,  positive ETCO2 and breath sounds checked- equal and bilateral Secured at: 23 cm Tube secured with: Tape Dental Injury: Teeth and Oropharynx as per pre-operative assessment

## 2017-08-19 NOTE — Discharge Summary (Signed)
[facility] NAME: Amy [name]    MR#:  [id number]  DATE OF BIRTH:  [birth year]  DATE OF ADMISSION:  08/12/2017 ADMITTING PHYSICIAN: Lance Coon, MD  DATE OF DISCHARGE: 08/15/2017  3:00 PM  PRIMARY CARE PHYSICIAN: Arnetha Courser, MD   ADMISSION DIAGNOSIS:  Choledocholithiasis [K80.50] Right upper quadrant abdominal pain [R10.11] Urinary tract infection with hematuria, site unspecified [N39.0, R31.9]  DISCHARGE DIAGNOSIS:  Principal Problem:   Choledocholithiasis Active Problems:   Anxiety   GERD (gastroesophageal reflux disease)   CKD (chronic kidney disease) stage 4, GFR 15-29 ml/min (HCC)   HTN (hypertension)   UTI (urinary tract infection)   SECONDARY DIAGNOSIS:   Past Medical History:  Diagnosis Date  . Anemia    low iron  . Anxiety   . Arthritis   . Bilateral cataracts   . Bronchitis    hx of   . Cancer of kidney, left ([place]) 2017  . Cataract   . Chronic kidney disease   . Depression   . Diabetes mellitus without complication ([place])   . Dry cough   . 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 GERD (gastroesophageal reflux disease)   . Headache   . History of frequent urinary tract infections   . Hyperlipidemia   . Hypertension   . Left renal mass   . Numbness and tingling    right hand   . Obesity   . Peripheral artery disease ([place])   . Urinary frequency   . Urinary incontinence   . Wheezing      ADMITTING HISTORY  HISTORY OF PRESENT ILLNESS:  Amy [name]  is a 82 y.o. female who presents with right upper quadrant abdominal pain for the past week.  Patient has a history of cholecystectomy, but her pain was increasing and she came to the ED tonight.  Here she was found to have likely choledocholithiasis on imaging.  Patient would benefit from ERCP and hospitalist were called for admission.  Of note, she was also found to have a UTI.  HOSPITAL  COURSE:   *Choledocholithiasis without cholangitis.  Bilirubin and alkaline phosphatase have trended down close to normal.  MRCP done and shows a small CBD stone but patient has no abdominal pain or vomiting.  Bilirubin is close to normal.  Afebrile.  Discussed with GI and patient will be discharged home to follow-up with Dr. Paulita Fujita of GI in [employer] for ERCP on Thursday. Patient will be call from his office for the procedure.  Information for office contact number also given to patient has been.  *E. coli UTI.  Treated with IV antibiotics.  *Hypertension.  Well controlled on home medications.  *Peripheral arterial disease.  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  No severe symptoms of peripheral arterial disease.  No recent stent placement.  Plavix held.  Can be restarted after ERCP.  *CKD stage III stable  Dementia.  Stable.  No inpatient delirium.  Stable for discharge home.  Advised to return if there is any fever, vomiting or abdominal pain.  CONSULTS OBTAINED:  Treatment Team:  Virgel Manifold, MD  DRUG ALLERGIES:   Allergies  Allergen Reactions  . Fentanyl Other (See Comments)    urine retention  . Penicillins Itching, Rash and Other (See Comments)    Has patient had a PCN reaction causing immediate rash, facial/tongue/throat swelling, SOB or  lightheadedness with hypotension: Yes Has patient had a PCN reaction causing severe rash involving mucus membranes or skin necrosis: No Has patient had a PCN reaction that required hospitalization: No Has patient had a PCN reaction occurring within the last 10 years: Yes If all of the above answers are "NO", then may proceed with Cephalosporin use.    DISCHARGE MEDICATIONS:   Allergies as of 08/15/2017      Reactions   Fentanyl Other (See Comments)   urine retention   Penicillins Itching, Rash, Other (See Comments)   Has patient had a PCN reaction causing immediate rash, facial/tongue/throat  swelling, SOB or lightheadedness with hypotension: Yes Has patient had a PCN reaction causing severe rash involving mucus membranes or skin necrosis: No Has patient had a PCN reaction that required hospitalization: No Has patient had a PCN reaction occurring within the last 10 years: Yes If all of the above answers are "NO", then may proceed with Cephalosporin use.      Medication List    TAKE these medications   acetaminophen 500 MG tablet Commonly known as:  TYLENOL Take 1,000 mg by mouth every 6 (six) hours as needed for mild pain, fever or headache.   albuterol 108 (90 Base) MCG/ACT inhaler Commonly known as:  PROVENTIL HFA;VENTOLIN HFA Inhale 2 puffs into the lungs every 6 (six) hours as needed for wheezing or shortness of breath.   atorvastatin 20 MG tablet Commonly known as:  LIPITOR Take 1 tablet (20 mg total) by mouth at bedtime.   benzonatate 100 MG capsule Commonly known as:  TESSALON Take 1 capsule (100 mg total) by mouth 2 (two) times daily as needed for cough.   calcitRIOL 0.25 MCG capsule Commonly known as:  ROCALTROL Take 0.25 mcg by mouth 3 (three) times a week.   citalopram 20 MG tablet Commonly known as:  CELEXA TAKE 1 TABLET (20 MG TOTAL) BY MOUTH AT BEDTIME.   clopidogrel 75 MG tablet Commonly known as:  PLAVIX Take 1 tablet (75 mg total) by mouth daily.   docusate sodium 100 MG capsule Commonly known as:  COLACE Take 100 mg by mouth daily.   donepezil 5 MG tablet Commonly known as:  ARICEPT Take 1 tablet by mouth at bedtime.   gabapentin 300 MG capsule Commonly known as:  NEURONTIN Take 1 capsule (300 mg total) by mouth 3 (three) times daily.   glucose blood test strip Use as instructed.   HAIR SKIN AND NAILS FORMULA Tabs Take by mouth. One tablet daily   HYDROcodone-acetaminophen 7.5-325 MG tablet Commonly known as:  NORCO Take 1 tablet by mouth 3 (three) times daily as needed.   lisinopril 5 MG tablet Commonly known as:   PRINIVIL,ZESTRIL Take 1 tablet by mouth daily.   metoprolol tartrate 50 MG tablet Commonly known as:  LOPRESSOR Take 1 tablet (50 mg total) by mouth 2 (two) times daily.   pantoprazole 40 MG tablet Commonly known as:  PROTONIX Take 1 tablet (40 mg total) by mouth daily.   QUEtiapine 50 MG tablet Commonly known as:  SEROQUEL Take 50 mg by mouth at bedtime.   ranitidine 150 MG capsule Commonly known as:  ZANTAC TAKE 1 CAPSULE AT BEDTIME     ASK your doctor about these medications   cephALEXin 250 MG capsule Commonly known as:  KEFLEX Take 1 capsule (250 mg total) by mouth 3 (three) times daily for 2 days. Ask about: Should I take this medication?   clindamycin 300 MG capsule Commonly known  as:  CLEOCIN Take 300 mg by mouth 2 (two) times daily. Ask about: Should I take this medication?       Today   VITAL SIGNS:  Blood pressure 130/69, pulse (!) 104, temperature 98.2 F (36.8 C), temperature source Oral, resp. rate 18, height 5\' 2"  (1.575 m), weight 83 kg (182 lb 15.7 oz), SpO2 93 %.  I/O:  No intake or output data in the 24 hours ending 08/19/17 1626  PHYSICAL EXAMINATION:  Physical Exam  GENERAL:  81 y.o.-year-old patient lying in the bed with no acute distress.  LUNGS: Normal breath sounds bilaterally, no wheezing, rales,rhonchi or crepitation. No use of accessory muscles of respiration.  CARDIOVASCULAR: S1, S2 normal. No murmurs, rubs, or gallops.  ABDOMEN: Soft, non-tender, non-distended. Bowel sounds present. No organomegaly or mass.  NEUROLOGIC: Moves all 4 extremities. PSYCHIATRIC: The patient is alert and awake. SKIN: No obvious rash, lesion, or ulcer.   DATA REVIEW:   CBC Recent Labs  Lab 08/13/17 0348  WBC 6.8  HGB 11.4*  HCT 34.4*  PLT 191    Chemistries  Recent Labs  Lab 08/15/17 0318  NA 137  K 3.9  CL 104  CO2 23  GLUCOSE 180*  BUN 10  CREATININE 1.62*  CALCIUM 8.5*  AST 65*  ALT 47  ALKPHOS 244*  BILITOT 1.3*    Cardiac  Enzymes No results for input(s): TROPONINI in the last 168 hours.  Microbiology Results  Results for orders placed or performed during the hospital encounter of 08/12/17  Urine Culture     Status: Abnormal   Collection Time: 08/12/17  6:07 PM  Result Value Ref Range Status   Specimen Description   Final    URINE, RANDOM Performed at [facility], [address] [place], [place] [address]    Special Requests   Final    NONE Performed at [facility], [address] [place], [place] [address]    Culture 80,000 COLONIES/mL ESCHERICHIA COLI (A)  Final   Report Status 08/15/2017 FINAL  Final   Organism ID, Bacteria ESCHERICHIA COLI (A)  Final      Susceptibility   Escherichia coli - MIC*    AMPICILLIN 8 SENSITIVE Sensitive     CEFAZOLIN <=4 SENSITIVE Sensitive     CEFTRIAXONE <=1 SENSITIVE Sensitive     CIPROFLOXACIN <=0.25 SENSITIVE Sensitive     GENTAMICIN <=1 SENSITIVE Sensitive     IMIPENEM <=0.25 SENSITIVE Sensitive     NITROFURANTOIN <=16 SENSITIVE Sensitive     TRIMETH/SULFA <=20 SENSITIVE Sensitive     AMPICILLIN/SULBACTAM 4 SENSITIVE Sensitive     PIP/TAZO <=4 SENSITIVE Sensitive     Extended ESBL NEGATIVE Sensitive     * 80,000 COLONIES/mL ESCHERICHIA COLI  Culture, blood (routine x 2)     Status: None   Collection Time: 08/12/17 10:38 PM  Result Value Ref Range Status   Specimen Description BLOOD BLOOD RIGHT HAND  Final   Special Requests   Final    BOTTLES DRAWN AEROBIC AND ANAEROBIC Blood Culture adequate volume   Culture   Final    NO GROWTH 5 DAYS Performed at [facility], [address], [place], [place] [address]    Report Status 08/17/2017 FINAL  Final  Culture, blood (routine x 2)     Status: None   Collection Time: 08/12/17 10:48 PM  Result Value Ref Range Status   Specimen Description BLOOD BLOOD LEFT HAND  Final   Special Requests   Final  BOTTLES DRAWN AEROBIC AND ANAEROBIC Blood Culture adequate volume   Culture    Final    NO GROWTH 5 DAYS Performed at [facility], [address] [place], [place] [address]    Report Status 08/17/2017 FINAL  Final  Gastrointestinal Panel by PCR , Stool     Status: None   Collection Time: 08/13/17 10:57 AM  Result Value Ref Range Status   Campylobacter species NOT DETECTED NOT DETECTED Final   Plesimonas shigelloides NOT DETECTED NOT DETECTED Final   Salmonella species NOT DETECTED NOT DETECTED Final   Yersinia enterocolitica NOT DETECTED NOT DETECTED Final   Vibrio species NOT DETECTED NOT DETECTED Final   Vibrio cholerae NOT DETECTED NOT DETECTED Final   Enteroaggregative E coli (EAEC) NOT DETECTED NOT DETECTED Final   Enteropathogenic E coli (EPEC) NOT DETECTED NOT DETECTED Final   Enterotoxigenic E coli (ETEC) NOT DETECTED NOT DETECTED Final   Shiga like toxin producing E coli (STEC) NOT DETECTED NOT DETECTED Final   Shigella/Enteroinvasive E coli (EIEC) NOT DETECTED NOT DETECTED Final   Cryptosporidium NOT DETECTED NOT DETECTED Final   Cyclospora cayetanensis NOT DETECTED NOT DETECTED Final   Entamoeba histolytica NOT DETECTED NOT DETECTED Final   Giardia lamblia NOT DETECTED NOT DETECTED Final   Adenovirus F40/41 NOT DETECTED NOT DETECTED Final   Astrovirus NOT DETECTED NOT DETECTED Final   Norovirus GI/GII NOT DETECTED NOT DETECTED Final   Rotavirus A NOT DETECTED NOT DETECTED Final   Sapovirus (I, II, IV, and V) NOT DETECTED NOT DETECTED Final    Comment: Performed at [facility], [address] [place], [place] [address]  C difficile quick scan w PCR reflex     Status: None   Collection Time: 08/13/17 10:57 AM  Result Value Ref Range Status   C Diff antigen NEGATIVE NEGATIVE Final   C Diff toxin NEGATIVE NEGATIVE Final   C Diff interpretation No C. difficile detected.  Final    Comment: Performed at [facility], [address] [place], [place] [address]    RADIOLOGY:  No results found.  Follow up with PCP in 1  week.  Management plans discussed with the patient, family and they are in agreement.  CODE STATUS:  Code Status History    Date Active Date Inactive Code Status Order ID Comments User Context   08/13/2017 0332 08/15/2017 1824 Full Code [id number]  Lance Coon, MD Inpatient   01/15/2015 2127 01/17/2015 1541 Full Code [id number]  Star Age, MD Inpatient      TOTAL TIME TAKING CARE OF THIS PATIENT ON DAY OF DISCHARGE: more than 30 minutes.   Leia Alf Darrien Laakso M.D on 08/19/2017 at 4:26 PM  Between 7am to 6pm - Pager - [phone]  After 6pm go to [email or web address] - password [email or web address] [facility]  Office  [phone]  CC: Primary care physician; Arnetha Courser, MD  Note: This dictation was prepared with Dragon dictation along with smaller phrase technology. Any transcriptional errors that result from this process are unintentional.

## 2017-08-19 NOTE — Discharge Instructions (Signed)
Endoscopic Retrograde Cholangiopancreatogram, Care After °This sheet gives you information about how to care for yourself after your procedure. Your health care provider may also give you more specific instructions. If you have problems or questions, contact your health care provider. °What can I expect after the procedure? °After the procedure, it is common to have: °· Soreness in your throat. °· Nausea. °· Bloating. °· Dizziness. °· Tiredness (fatigue). ° °Follow these instructions at home: °· Take over-the-counter and prescription medicines only as told by your health care provider. °· Do not drive for 24 hours if you were given a medicine to help you relax (sedative) during your procedure. Have someone stay with you for 24 hours after the procedure. °· Return to your normal activities as told by your health care provider. Ask your health care provider what activities are safe for you. °· Return to eating what you normally do as soon as you feel well enough or as told by your health care provider. °· Keep all follow-up visits as told by your health care provider. This is important. °Contact a health care provider if: °· You have pain in your abdomen that does not get better with medicine. °· You develop signs of infection, such as: °? Chills. °? Feeling unwell. °Get help right away if: °· You have difficulty swallowing. °· You have worsening pain in your throat, chest, or abdomen. °· You vomit bright red blood or a substance that looks like coffee grounds. °· You have bloody or very black stools. °· You have a fever. °· You have a sudden increase in swelling (bloating) in your abdomen. °Summary °· After the procedure, it is common to feel tired and to have some discomfort in your throat. °· Contact your health care provider if you have signs of infection--such as chills or feeling unwell--or if you have pain that does not improve with medicine. °· Get help right away if you have trouble swallowing, worsening  pain, bloody or black vomit, bloody or black stools, a fever, or increased swelling in your abdomen. °· Keep all follow-up visits as told by your health care provider. This is important. °This information is not intended to replace advice given to you by your health care provider. Make sure you discuss any questions you have with your health care provider. °Document Released: 02/28/2013 Document Revised: 03/29/2016 Document Reviewed: 03/29/2016 °Elsevier Interactive Patient Education © 2017 [employer] Inc[employer] ° °

## 2017-08-19 NOTE — Anesthesia Preprocedure Evaluation (Signed)
Anesthesia Evaluation  Patient identified by MRN, date of birth, ID band Patient awake    Reviewed: Allergy & Precautions, NPO status , Patient's Chart, lab work & pertinent test results, reviewed documented beta blocker date and time   History of Anesthesia Complications Negative for: history of anesthetic complications  Airway Mallampati: II  TM Distance: >3 FB Neck ROM: Full    Dental  (+) Partial Lower, Partial Upper   Pulmonary asthma , COPD,  COPD inhaler, former smoker,  Non-compliant with inhaler   + rhonchi        Cardiovascular hypertension, Pt. on medications and Pt. on home beta blockers + Peripheral Vascular Disease   Rhythm:Regular     Neuro/Psych  Headaches, PSYCHIATRIC DISORDERS Anxiety Depression Dementia  Neuromuscular disease    GI/Hepatic GERD  ,  Endo/Other  diabetes  Renal/GU CRFRenal disease     Musculoskeletal  (+) Arthritis ,   Abdominal   Peds  Hematology  (+) anemia ,   Anesthesia Other Findings   Reproductive/Obstetrics                             Anesthesia Physical Anesthesia Plan  ASA: III  Anesthesia Plan: General   Post-op Pain Management:    Induction: Intravenous  PONV Risk Score and Plan: 3 and Dexamethasone and Ondansetron  Airway Management Planned: Oral ETT  Additional Equipment: None  Intra-op Plan:   Post-operative Plan: Extubation in OR  Informed Consent: I have reviewed the patients History and Physical, chart, labs and discussed the procedure including the risks, benefits and alternatives for the proposed anesthesia with the patient or authorized representative who has indicated his/her understanding and acceptance.   Dental advisory given  Plan Discussed with: CRNA and Surgeon  Anesthesia Plan Comments:         Anesthesia Quick Evaluation

## 2017-08-19 NOTE — Op Note (Addendum)
[facility] Patient Name: Amy [name] Procedure Date : 08/19/2017 MRN: [id number] Attending MD: Arta Silence , MD Date of Birth: 02/10/1936 CSN: [id number] Age: 82 Admit Type: Inpatient Procedure:                ERCP Indications:              Abdominal pain of suspected biliary origin, Bile                            duct stone(s), Elevated liver enzymes Providers:                Arta Silence, MD, Burtis Junes, RN, Alan Mulder,                            Technician, Otis Brace, MD Referring MD:             [facility] Medicines:                General Anesthesia, Indomethacin 100 mg PR, Cipro                            400 mg IV Complications:            No immediate complications. Estimated Blood Loss:     Estimated blood loss: none. Procedure:                Pre-Anesthesia Assessment:                           - Prior to the procedure, a History and Physical                            was performed, and patient medications and                            allergies were reviewed. The patient's tolerance of                            previous anesthesia was also reviewed. The risks                            and benefits of the procedure and the sedation                            options and risks were discussed with the patient.                            All questions were answered, and informed consent                            was obtained. Prior Anticoagulants: The patient has                            taken Plavix (clopidogrel), last dose was 7 days  prior to procedure. ASA Grade Assessment: III - A                            patient with severe systemic disease. After                            reviewing the risks and benefits, the patient was                            deemed in satisfactory condition to undergo the                            procedure.                           After obtaining informed  consent, the scope was                            passed under direct vision. Throughout the                            procedure, the patient's blood pressure, pulse, and                            oxygen saturations were monitored continuously. The                            [id number] scope was introduced through the                            mouth, and used to inject contrast into and used to                            cannulate the bile duct. The ERCP was accomplished                            without difficulty. The patient tolerated the                            procedure well. Scope In: Scope Out: Findings:      A scout film of the abdomen was obtained. Surgical clips, consistent       with a previous cholecystectomy, were seen in the area of the right       upper quadrant of the abdomen. The esophagus was successfully intubated       under direct vision. The scope was advanced to a normal major papilla in       the descending duodenum without detailed examination of the pharynx,       larynx and associated structures, and upper GI tract. The upper GI tract       was grossly normal. Pancreatic duct consistently cannulated, thus one 5       Fr by 3 cm pancreatic stent with a full external pigtail and a single       internal flap was placed 3 cm into the ventral pancreatic  duct. The       stent was in good position. The biliary sphincterotomy was initially       performed to a total of 3 mm in length with a needle knife over a       pancreatic stent using blended current. There was no post-sphincterotomy       bleeding. The bile duct was deeply cannulated. Contrast was injected. I       personally interpreted the bile duct images. Ductal flow of contrast was       adequate. The main bile duct and common hepatic duct were moderately       dilated and diffusely dilated. The largest diameter was 12 mm. The       middle third of the main bile duct contained one stone, which was  10 mm       in diameter. Post-cholecystectomy. The biliary tree was otherwise       normal. A 10 mm biliary sphincterotomy was extended with a traction       (standard) sphincterotome using blended current. There was no       post-sphincterotomy bleeding. The biliary tree was swept with a basket       starting at the lower third of the main duct. Sludge was swept from the       duct. One stone was removed. No stones remained. Pancreatogram was not       obtained. There were no immediate post-procedural complications. Impression:               - One pancreatic stent was placed into the ventral                            pancreatic duct.                           - A filling defect consistent with a stone was seen                            on the cholangiogram.                           - The entire main bile duct and common hepatic duct                            were moderately dilated.                           - Choledocholithiasis was found. Complete removal                            was accomplished by biliary sphincterotomy and                            basket extraction. Moderate Sedation:      None Recommendation:           - Avoid aspirin and nonsteroidal anti-inflammatory                            medicines for 3 days.                           -  Resume Plavix (clopidogrel) at prior dose in 3                            days.                           - Watch for pancreatitis, bleeding, perforation,                            and cholangitis.                           - Perform a flat plate and upright abdominal x-ray                            in 2 weeks. If pancreatic duct stent still in                            place, will need EGD for stent removal.                           - Return to GI clinic PRN. Procedure Code(s):        --- Professional ---                           [id number], Endoscopic retrograde                            cholangiopancreatography (ERCP); with  placement of                            endoscopic stent into biliary or pancreatic duct,                            including pre- and post-dilation and guide wire                            passage, when performed, including sphincterotomy,                            when performed, each stent                           43264, Endoscopic retrograde                            cholangiopancreatography (ERCP); with removal of                            calculi/debris from biliary/pancreatic duct(s)                           43262, 59, Endoscopic retrograde                            cholangiopancreatography (ERCP); with  sphincterotomy/papillotomy Diagnosis Code(s):        --- Professional ---                           K80.50, Calculus of bile duct without cholangitis                            or cholecystitis without obstruction                           R10.9, Unspecified abdominal pain                           R74.8, Abnormal levels of other serum enzymes                           K83.8, Other specified diseases of biliary tract                           R93.2, Abnormal findings on diagnostic imaging of                            liver and biliary tract CPT copyright 2016 American Medical Association. All rights reserved. The codes documented in this report are preliminary and upon coder review may  be revised to meet current compliance requirements. Arta Silence, MD 08/19/2017 12:29:59 PM This report has been signed electronically. Otis Brace, MD Number of Addenda: 0

## 2017-08-19 NOTE — Anesthesia Postprocedure Evaluation (Signed)
Anesthesia Post Note  Patient: Amy [name]  Procedure(s) Performed: ENDOSCOPIC RETROGRADE CHOLANGIOPANCREATOGRAPHY (ERCP) (N/A )     Patient location during evaluation: PACU Anesthesia Type: General Level of consciousness: awake and alert Pain management: pain level controlled Vital Signs Assessment: post-procedure vital signs reviewed and stable Respiratory status: spontaneous breathing, nonlabored ventilation, respiratory function stable and patient connected to nasal cannula oxygen Cardiovascular status: blood pressure returned to baseline and stable Postop Assessment: no apparent nausea or vomiting Anesthetic complications: no    Last Vitals:  Vitals:   08/19/17 1257 08/19/17 1311  BP: (!) 170/112 (!) 183/95  Pulse: 68 70  Resp: 19   Temp:    SpO2: 100% 100%    Last Pain:  Vitals:   08/19/17 1311  TempSrc:   PainSc: 0-No pain                 Press [place]ale A.

## 2017-08-19 NOTE — Interval H&P Note (Signed)
History and Physical Interval Note:  08/19/2017 10:06 AM  Amy [name]  has presented today for surgery, with the diagnosis of cbd stone  The various methods of treatment have been discussed with the patient and family. After consideration of risks, benefits and other options for treatment, the patient has consented to  Procedure(s): ENDOSCOPIC RETROGRADE CHOLANGIOPANCREATOGRAPHY (ERCP) (N/A) as a surgical intervention .  The patient's history has been reviewed, patient examined, no change in status, stable for surgery.  I have reviewed the patient's chart and labs.  Questions were answered to the patient's satisfaction.     Landry Dyke

## 2017-08-21 ENCOUNTER — Encounter (HOSPITAL_COMMUNITY): Payer: Self-pay | Admitting: Gastroenterology

## 2017-08-22 ENCOUNTER — Other Ambulatory Visit: Payer: Self-pay | Admitting: *Deleted

## 2017-08-22 NOTE — Patient Outreach (Signed)
[facility]) Care Management  08/22/2017  Amy [name] Oct 08, 1935 [id number]    Chart reviewed :  Patient history includes : Diabetes, COPD,Alzheimer's,chronicpain syndrome, s/p left AKA,excessive falling , hx renal cancer Recent hospital admission at [facility] 3/22- 3/25 Dx: RUQ pain , Choledocholithiasis, UTI    Placed call to patient, spoke briefly with her husband, HIPAA verifiled, He request return call, as he is dressing patient she has MD appointment on this afternoon.   Returned call to patient husband, he discussed patient is doing pretty good on today, had visit with dentist related to problem of tooth abscess, that was connected partial plate, has return visit on tomorrow also.  Husband reports patient is tolerating soft foods well, she still has some small bowel movement , 2 to 3  Day, discussed difficulties with getting her to commode and cleaning her up. He discussed he has received a phone call from Home care providers , Hassell Done he attempted to return call to today and had to leave a message he is planning to contact in again on 4/2 regarding personal care services after recent hospital discharge  as referred by [employer] , LCSW .  Mr.Trevor discussed patient recent Gall stone removal procedure and states patient is doing well since procedure without complaint of abdominal pain, patient denies shortness of breath.   Mr.Bolda is agreeable to scheduling initial home visit, he discussed how this week was full due to follow up MD appointments.  Patient PCP office following for  transition of care.   Plan RN will schedule home visit in the next week, and continue to follow for complex care management , concern related to increased falls and assess for further needs.  Joylene Draft, RN, Marine Management Coordinator  [phone]- Mobile [phone]- Toll Free Main Office

## 2017-08-22 NOTE — Patient Outreach (Signed)
[facility]) Care Management  08/22/2017  Amy [name] [birth year] [id number]   Phone call from patient's spouse to follow up on personal care services through [facility] Providers. Per patient's spouse, he was contacted by the nurse coordinator Hassell Done, however needed to call him back. He returned the call today and has left a message for a return call. Helene Kelp is not in the office today.   Plan: This Education officer, museum will follow up with patient's spouse within 1 week regarding Homecare Providers referral.   Sheralyn Boatman [facility] Care Management [phone]

## 2017-08-23 ENCOUNTER — Other Ambulatory Visit: Payer: Self-pay | Admitting: *Deleted

## 2017-08-23 DIAGNOSIS — D509 Iron deficiency anemia, unspecified: Secondary | ICD-10-CM

## 2017-08-24 ENCOUNTER — Inpatient Hospital Stay: Payer: Medicare HMO

## 2017-08-24 ENCOUNTER — Encounter: Payer: Self-pay | Admitting: Hematology and Oncology

## 2017-08-24 ENCOUNTER — Inpatient Hospital Stay: Payer: Medicare HMO | Attending: Hematology and Oncology | Admitting: Hematology and Oncology

## 2017-08-24 ENCOUNTER — Telehealth: Payer: Self-pay | Admitting: Gastroenterology

## 2017-08-24 ENCOUNTER — Other Ambulatory Visit: Payer: Self-pay

## 2017-08-24 VITALS — BP 175/91 | HR 58 | Temp 98.5°F | Resp 20

## 2017-08-24 DIAGNOSIS — N189 Chronic kidney disease, unspecified: Secondary | ICD-10-CM

## 2017-08-24 DIAGNOSIS — D509 Iron deficiency anemia, unspecified: Secondary | ICD-10-CM | POA: Diagnosis not present

## 2017-08-24 DIAGNOSIS — R197 Diarrhea, unspecified: Secondary | ICD-10-CM | POA: Diagnosis not present

## 2017-08-24 DIAGNOSIS — K219 Gastro-esophageal reflux disease without esophagitis: Secondary | ICD-10-CM

## 2017-08-24 DIAGNOSIS — C649 Malignant neoplasm of unspecified kidney, except renal pelvis: Secondary | ICD-10-CM | POA: Diagnosis not present

## 2017-08-24 DIAGNOSIS — Z87891 Personal history of nicotine dependence: Secondary | ICD-10-CM | POA: Diagnosis not present

## 2017-08-24 DIAGNOSIS — I82402 Acute embolism and thrombosis of unspecified deep veins of left lower extremity: Secondary | ICD-10-CM

## 2017-08-24 DIAGNOSIS — D508 Other iron deficiency anemias: Secondary | ICD-10-CM

## 2017-08-24 DIAGNOSIS — D49519 Neoplasm of unspecified behavior of unspecified kidney: Secondary | ICD-10-CM

## 2017-08-24 LAB — COMPREHENSIVE METABOLIC PANEL
ALT: 17 U/L (ref 14–54)
AST: 24 U/L (ref 15–41)
Albumin: 2.9 g/dL — ABNORMAL LOW (ref 3.5–5.0)
Alkaline Phosphatase: 123 U/L (ref 38–126)
Anion gap: 9 (ref 5–15)
BUN: 13 mg/dL (ref 6–20)
CO2: 24 mmol/L (ref 22–32)
Calcium: 8 mg/dL — ABNORMAL LOW (ref 8.9–10.3)
Chloride: 103 mmol/L (ref 101–111)
Creatinine, Ser: 1.58 mg/dL — ABNORMAL HIGH (ref 0.44–1.00)
GFR calc Af Amer: 35 mL/min — ABNORMAL LOW (ref >60)
GFR calc non Af Amer: 30 mL/min — ABNORMAL LOW (ref >60)
Glucose, Bld: 99 mg/dL (ref 65–99)
Potassium: 3.8 mmol/L (ref 3.5–5.1)
Sodium: 136 mmol/L (ref 135–145)
Total Bilirubin: 0.9 mg/dL (ref 0.3–1.2)
Total Protein: 6.2 g/dL — ABNORMAL LOW (ref 6.5–8.1)

## 2017-08-24 LAB — CBC WITH DIFFERENTIAL/PLATELET
Basophils Absolute: 0.1 10*3/uL (ref 0–0.1)
Basophils Relative: 2 %
Eosinophils Absolute: 0.2 10*3/uL (ref 0–0.7)
Eosinophils Relative: 3 %
HCT: 37.1 % (ref 35.0–47.0)
Hemoglobin: 12.2 g/dL (ref 12.0–16.0)
Lymphocytes Relative: 14 %
Lymphs Abs: 1.3 10*3/uL (ref 1.0–3.6)
MCH: 30.4 pg (ref 26.0–34.0)
MCHC: 32.8 g/dL (ref 32.0–36.0)
MCV: 92.7 fL (ref 80.0–100.0)
Monocytes Absolute: 0.6 10*3/uL (ref 0.2–0.9)
Monocytes Relative: 7 %
Neutro Abs: 6.6 10*3/uL — ABNORMAL HIGH (ref 1.4–6.5)
Neutrophils Relative %: 74 %
Platelets: 238 10*3/uL (ref 150–440)
RBC: 4 MIL/uL (ref 3.80–5.20)
RDW: 16 % — ABNORMAL HIGH (ref 11.5–14.5)
WBC: 8.9 10*3/uL (ref 3.6–11.0)

## 2017-08-24 LAB — FERRITIN: Ferritin: 67 ng/mL (ref 11–307)

## 2017-08-24 MED ORDER — RANITIDINE HCL 150 MG PO CAPS: 150.0000 mg | ORAL_CAPSULE | Freq: Every day | ORAL | 3 refills | Status: DC

## 2017-08-24 NOTE — Telephone Encounter (Signed)
Verified pt is taking this medication, mail order is requesting refill now in order to have it ready to mail so that pt does not run out. Please advise.

## 2017-08-24 NOTE — Progress Notes (Signed)
[facility] day:  08/24/2017  Chief Complaint: Amy [name] is a 82 y.o. female with iron deficiency anemia and a history of stage III renal cell carcinoma who is seen for 6 month assessment.  HPI:   The patient was last seen in the medical oncology clinic on 02/23/2017.  At that time, she had a productive cough (white sputum) x 1 week. She had RIGHT lateral rib pain secondary to coughing. She was wheezing. SPO2 93% on room air.  Capillary refill  was normal.  Patient was not in any respiratory distress.  She was no longer taking her oral iron. WBC was 9,300 with an Ogden of 6,200. Hemoglobin was 11.9, hematocrit 35.8, platelets 154,000. Creatinine 1.70; GFR 27. Calcium 8.0.  Chest CT on 08/12/2017 revealed left main and three-vessel coronary arteriosclerosis.  There was no active pulmonary disease. Mild centrilobular emphysema. There was stable appearing pleural-based nodular opacity in the anterior aspect of the right upper lobe measuring 5.6 mm in average. There was stable 4 mm medial left upper lobe pleural-based opacity is unchanged.  There was aortic atherosclerosis.  Abdomen and pelvic CT on 08/12/2017 revealed a 9 mm hyperdensity along the expected location of the distal common bile duct near the ampulla with dilatation of the CBD to 17 mm, both increased in appearance since prior. Although findings may simply represent imaging of intraluminal contrast, given patient's right upper quadrant pain and interval increase in CBD caliber, choledocholithiasis was raised. She is status post cholecystectomy.  She is status post left nephrectomy. Simple renal cysts of the right kidney were noted with 1 hyperdense complex appearing lesion possibly representing a hemorrhagic or proteinaceous cyst measuring approximate 1.1 cm.  No worrisome features were identified with respect of this complex lesion.  There was colonic diverticulosis without acute diverticulitis.  There was no bowel obstruction.  There was a remote appearing burst fracture involving the superior endplate of L1 with 3 mm of retropulsion causing slight central canal stenosis.  MR abdomen MRCP on 08/14/2017 revealed a solitary 9 mm choledocholith in the mid to upper common bile duct. There was mild diffuse intrahepatic biliary ductal dilatation with dilated CBD (13 mm diameter).  There was indeterminate small 1.2 cm renal cortical mass in the posterior lower right kidney, incompletely characterized on this noncontrast MRI study, renal neoplasm not excluded. Recommend attention on follow-up MRI (preferred) or CT abdomen without and with IV contrast in 6 months.  There was ectatic 2.7 cm infrarenal abdominal aorta. Ectatic abdominal aorta at risk for aneurysm development. Recommend followup by ultrasound in 5 years.  She underwent ERCP on 08/19/2017 by Dr. Arta Silence.  One pancreatic stent was placed into the ventral pancreatic duct.  A filling defect consistent with a stone was seen on the cholangiogram. The entire main bile duct and common hepatic duct were moderately dilated.  Choledocholithiasis was found. Complete removal was accomplished by biliary sphincterotomy and basket extraction.  During the interim, patient has been having diarrhea since she started her oral iron supplementation for over 6 months. Patient has stopped the oral iron. She states, "the only thing that has changed is the color. I am still having the diarrhea though". She has 3-4 stools/day.  Patient has no other significant complaints today.    Patient denies B symptoms and interval infections. Patient is eating well. Patient complains of generalized pain rated 8/10 in the clinic today.    Past Medical History:  Diagnosis Date  .  Anemia    low iron  . Anxiety   . Arthritis   . Bilateral cataracts   . Bronchitis    hx of   . Cancer of kidney, left ([place]) 2017  . Cataract   . Chronic kidney disease   . Depression    . Diabetes mellitus without complication ([place])   . Dry cough   . 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 GERD (gastroesophageal reflux disease)   . Headache   . History of frequent urinary tract infections   . Hyperlipidemia   . Hypertension   . Left renal mass   . Numbness and tingling    right hand   . Obesity   . Peripheral artery disease ([place])   . Urinary frequency   . Urinary incontinence   . Wheezing     Past Surgical History:  Procedure Laterality Date  . ABDOMINAL HYSTERECTOMY  1978  . CARPAL TUNNEL RELEASE Bilateral   . CHOLECYSTECTOMY    . COLONOSCOPY    . COLONOSCOPY WITH ESOPHAGOGASTRODUODENOSCOPY (EGD)    . ERCP N/A 08/19/2017   Procedure: ENDOSCOPIC RETROGRADE CHOLANGIOPANCREATOGRAPHY (ERCP);  Surgeon: Arta Silence, MD;  Location[facility] ENDOSCOPY;  Service: Endoscopy;  Laterality: N/A;  . LEG AMPUTATION     above the knee / left   . ROBOT ASSISTED LAPAROSCOPIC NEPHRECTOMY Left 01/15/2015   Procedure: ROBOTIC ASSISTED LAPAROSCOPIC RADICAL NEPHRECTOMY;  Surgeon: Alexis Frock, MD;  Location: [facility] ORS;  Service: Urology;  Laterality: Left;  . stent placement right leg       Family History  Problem Relation Age of Onset  . Cancer Mother        Stomach  . Cancer Father   . Diabetes Brother   . Cancer Brother     Social History:  reports that she quit smoking about 13 years ago. Her smoking use included cigarettes. She has a 76.50 pack-year smoking history. She has never used smokeless tobacco. She reports that she drank alcohol. She reports that she does not use drugs.  She is an Therapist, sports.  She lives in [place].  The patient is accompanied by her husband, [name], today.  Allergies:  Allergies  Allergen Reactions  . Fentanyl Other (See Comments)    urine retention  . Penicillins Itching, Rash and Other (See Comments)    Has patient had a PCN reaction causing immediate rash, facial/tongue/throat swelling, SOB or  lightheadedness with hypotension: Yes Has patient had a PCN reaction causing severe rash involving mucus membranes or skin necrosis: No Has patient had a PCN reaction that required hospitalization: No Has patient had a PCN reaction occurring within the last 10 years: Yes If all of the above answers are "NO", then may proceed with Cephalosporin use.    Current Medications: Current Outpatient Medications  Medication Sig Dispense Refill  . acetaminophen (TYLENOL) 500 MG tablet Take 1,000 mg by mouth every 6 (six) hours as needed for mild pain, fever or headache.     . albuterol (PROVENTIL HFA;VENTOLIN HFA) 108 (90 Base) MCG/ACT inhaler Inhale 2 puffs into the lungs every 6 (six) hours as needed for wheezing or shortness of breath. 1 Inhaler 2  . atorvastatin (LIPITOR) 20 MG tablet Take 1 tablet (20 mg total) by mouth at bedtime. 90 tablet 0  . benzonatate (TESSALON) 100 MG capsule Take 1 capsule (100 mg total) by mouth 2 (two) times daily as needed for cough. 20 capsule 0  .  calcitRIOL (ROCALTROL) 0.25 MCG capsule Take 0.25 mcg by mouth 3 (three) times a week.    . citalopram (CELEXA) 20 MG tablet TAKE 1 TABLET (20 MG TOTAL) BY MOUTH AT BEDTIME. 90 tablet 0  . clopidogrel (PLAVIX) 75 MG tablet Take 1 tablet (75 mg total) by mouth daily. 90 tablet 3  . docusate sodium (COLACE) 100 MG capsule Take 100 mg by mouth daily.     [name] donepezil (ARICEPT) 5 MG tablet Take 1 tablet by mouth at bedtime.    . gabapentin (NEURONTIN) 300 MG capsule Take 1 capsule (300 mg total) by mouth 3 (three) times daily. 270 capsule 3  . glucose blood test strip Use as instructed. 100 each 12  . HYDROcodone-acetaminophen (NORCO) 7.5-325 MG tablet Take 1 tablet by mouth 3 (three) times daily as needed. 90 tablet 0  . lisinopril (PRINIVIL,ZESTRIL) 5 MG tablet Take 1 tablet by mouth daily.    . metoprolol tartrate (LOPRESSOR) 50 MG tablet Take 1 tablet (50 mg total) by mouth 2 (two) times daily. 180 tablet 0  . Multiple  Vitamins-Minerals (HAIR SKIN AND NAILS FORMULA) TABS Take by mouth. One tablet daily    . pantoprazole (PROTONIX) 40 MG tablet Take 1 tablet (40 mg total) by mouth daily. 90 tablet 1  . QUEtiapine (SEROQUEL) 50 MG tablet Take 50 mg by mouth at bedtime.     . ranitidine (ZANTAC) 150 MG capsule TAKE 1 CAPSULE AT BEDTIME (Patient not taking: Reported on 08/16/2017) 90 capsule 0   Current Facility-Administered Medications  Medication Dose Route Frequency Provider Last Rate Last Dose  . albuterol (PROVENTIL) (2.5 MG/3ML) 0.083% nebulizer solution 2.5 mg  2.5 mg Nebulization Once Poulose, [name] E, NP      . albuterol (PROVENTIL) (2.5 MG/3ML) 0.083% nebulizer solution 2.5 mg  2.5 mg Nebulization Once Poulose, Bethel Born, NP        Review of Systems:  GENERAL:  Feels "alright I reckon".  "Hope I'm good".  No fevers or sweats.  No new weight.  PERFORMANCE STATUS (ECOG):  2-3 HEENT:  No visual changes, sore throat, mouth sores or tenderness. Lungs: No shortness of breath or cough. No hemoptysis. Cardiac:  No chest pain, palpitations, orthopnea, or PND. GI:  Diarrhea (see HPI).  No nausea, vomiting, constipation, or hematochezia. GU:  No urgency, frequency, dysuria, or hematuria. Musculoskeletal:  No back pain.  No joint pain.  No muscle tenderness. Extremities:  No pain or swelling. Skin:  No rashes or skin changes. Neuro:  No headache, numbness or weakness, balance or coordination issues. Endocrine:  No diabetes, thyroid issues, hot flashes or night sweats. Psych:  No mood changes, depression or anxiety. Pain:  8/10 - generalized. Review of systems:  All other systems reviewed and found to be negative.  Physical Exam: Blood pressure (!) 175/91, pulse (!) 58, temperature 98.5 F (36.9 C), temperature source Tympanic, resp. rate 20, SpO2 96 %. GENERAL:  Well developed, well nourished, woman sitting comfortably in a wheelchair in the exam room in no acute distress. MENTAL STATUS:  Alert and  oriented to person, place and time. HEAD:  Graying hair.  Head tilt to the left.  Normocephalic, atraumatic, face symmetric, no Cushingoid features. EYES:  Glasses.  Brown eyes.  Pupils equal round and reactive to light and accomodation.  No conjunctivitis or scleral icterus. ENT:  Oropharynx clear without lesion.  Tongue normal. Mucous membranes moist.  RESPIRATORY:  Clear to auscultation without rales, wheezes or rhonchi. CARDIOVASCULAR:  Regular rate and  rhythm without murmur, rub or gallop. ABDOMEN:  Soft, non-tender, with active bowel sounds, and no hepatosplenomegaly.  No masses. SKIN:  No rashes, ulcers or lesions. EXTREMITIES: Left sided above the knee amputation.  No edema, no skin discoloration or tenderness.  No palpable cords. LYMPH NODES: No palpable cervical, supraclavicular, axillary or inguinal adenopathy  NEUROLOGICAL: Unremarkable. PSYCH:  Appropriate.   Imaging studies: 11/29/2014:  Abdomen and pelvic CT revealed a 5.5 x 3.7 solid enhancing mass arising from the upper pole of the left kidney.  There was evidence of renal vein invasion.  There were borderline enlarged periaortic lymph node (9 mm).  08/16/2016:  Chest, abdomen, and pelvic CT revealed left nephrectomy without evidence of metastatic disease.  There were low-attenuation lesions in the right kidney, likely cysts although definitive characterization is limited without post-contrast imaging.  There was an ectatic abdominal aorta at risk for aneurysm development.  02/16/2017:  Chest, abdomen and pelvic CT revealed no evidence of recurrent disease.  There was a 7 x 5 mm right upper lobe pulmonary nodule (previously 6 x 4 mm). There was a 3 mm (previously 1-2 mm) left lower lobe pulmonary nodule. 08/12/2017:  Chest CT revealed no active pulmonary disease. Mild centrilobular emphysema. There was stable appearing pleural-based nodular opacity in the anterior aspect of the right upper lobe measuring 5.6 mm. There was stable 4  mm medial left upper lobe pleural-based opacity is unchanged.  08/12/2017:  Abdomen and pelvic CT revealed a 9 mm hyperdensity along the expected location of the distal common bile duct near the ampulla with dilatation of the CBD to 17 mm.  Simple renal cysts of the right kidney were noted with a 1.1 cm hyperdense complex appearing lesion possibly representing a hemorrhagic or proteinaceous cyst.  No worrisome features were identified with respect of this complex lesion.   08/14/2017:  MR abdomen MRCP revealed a solitary 9 mm choledocholith in the mid to upper common bile duct. There was mild diffuse intrahepatic biliary ductal dilatation with dilated CBD (13 mm diameter).  There was indeterminate small 1.2 cm renal cortical mass in the posterior lower right kidney, incompletely characterized on this noncontrast MRI study, renal neoplasm not excluded. Recommend attention on follow-up MRI (preferred) or CT abdomen without and with IV contrast in 6 months.  There was ectatic 2.7 cm infrarenal abdominal aorta.  Ectatic abdominal aorta at risk for aneurysm development. Recommend followup by ultrasound in 5 years.   Appointment on 08/24/2017  Component Date Value Ref Range Status  . WBC 08/24/2017 8.9  3.6 - 11.0 K/uL Final  . RBC 08/24/2017 4.00  3.80 - 5.20 MIL/uL Final  . Hemoglobin 08/24/2017 12.2  12.0 - 16.0 g/dL Final  . HCT 08/24/2017 37.1  35.0 - 47.0 % Final  . MCV 08/24/2017 92.7  80.0 - 100.0 fL Final  . MCH 08/24/2017 30.4  26.0 - 34.0 pg Final  . MCHC 08/24/2017 32.8  32.0 - 36.0 g/dL Final  . RDW 08/24/2017 16.0* 11.5 - 14.5 % Final  . Platelets 08/24/2017 238  150 - 440 K/uL Final  . Neutrophils Relative % 08/24/2017 74  % Final  . Neutro Abs 08/24/2017 6.6* 1.4 - 6.5 K/uL Final  . Lymphocytes Relative 08/24/2017 14  % Final  . Lymphs Abs 08/24/2017 1.3  1.0 - 3.6 K/uL Final  . Monocytes Relative 08/24/2017 7  % Final  . Monocytes Absolute 08/24/2017 0.6  0.2 - 0.9 K/uL Final  .  Eosinophils Relative 08/24/2017 3  %  Final  . Eosinophils Absolute 08/24/2017 0.2  0 - 0.7 K/uL Final  . Basophils Relative 08/24/2017 2  % Final  . Basophils Absolute 08/24/2017 0.1  0 - 0.1 K/uL Final   Performed at [facility], [address] [place], [place] [address]  . Sodium 08/24/2017 136  135 - 145 mmol/L Final  . Potassium 08/24/2017 3.8  3.5 - 5.1 mmol/L Final  . Chloride 08/24/2017 103  101 - 111 mmol/L Final  . CO2 08/24/2017 24  22 - 32 mmol/L Final  . Glucose, Bld 08/24/2017 99  65 - 99 mg/dL Final  . BUN 08/24/2017 13  6 - 20 mg/dL Final  . Creatinine, Ser 08/24/2017 1.58* 0.44 - 1.00 mg/dL Final  . Calcium 08/24/2017 8.0* 8.9 - 10.3 mg/dL Final  . Total Protein 08/24/2017 6.2* 6.5 - 8.1 g/dL Final  . Albumin 08/24/2017 2.9* 3.5 - 5.0 g/dL Final  . AST 08/24/2017 24  15 - 41 U/L Final  . ALT 08/24/2017 17  14 - 54 U/L Final  . Alkaline Phosphatase 08/24/2017 123  38 - 126 U/L Final  . Total Bilirubin 08/24/2017 0.9  0.3 - 1.2 mg/dL Final  . GFR calc non Af Amer 08/24/2017 30* >60 mL/min Final  . GFR calc Af Amer 08/24/2017 35* >60 mL/min Final   Comment: (NOTE) The eGFR has been calculated using the CKD EPI equation. This calculation has not been validated in all clinical situations. eGFR's persistently <60 mL/min signify possible Chronic Kidney Disease.   Georgiann Hahn gap 08/24/2017 9  5 - 15 Final   Performed at [facility], [address] [place], [place] [address]    Assessment:  CICLEY GANESH is a 82 y.o. female with mild iron deficiency anemia.  EGD and colonoscopy on 10/10/2013 were negative per patient.  She has had reflux for the past 15-30 years.  She is on Prilosec.  Diet is poor.  She denies any pica.  Work-up on 07/14/2016 revealed a hematocrit of 35.5, hemoglobin 11.6, MCV 87.6, platelets 173,000, WBC 7900 with an ANC of 5000.  SPEP revealed no monoclonal protein.  Ferritin was 22 (low).  Folate was 9.8.  Guaiac cards were  negative x 3.  She is on oral iron.  She has a history of stage III renal cell carcinoma s/p robotic-assisted laparoscopic left radical nephrectomy on 01/09/2015. Pathology revealed a 6.3 cm renal cell carcinoma.  Tumor was predominantly clear cell with a minor component of type I papillary.  Fuhrman grade 3 of 4.  Tumor extended into the renal vein.  There was focal extension into the perirenal and sinus adipose tissue.  Margins were negative.  There was metastatic tumor in 2 of 2 lymph nodes.  Pathologic stage was [id number].    Abdomen and pelvic CT scan on 11/29/2014 revealed a 5.5 x 3.7 solid enhancing mass arising from the upper pole of the left kidney.  There was evidence of renal vein invasion.  There were borderline enlarged periaortic lymph node (9 mm).   Chest CT on 08/12/2017 revealed no active pulmonary disease. Mild centrilobular emphysema. There was stable appearing pleural-based nodular opacity in the anterior aspect of the right upper lobe measuring 5.6 mm. There was stable 4 mm medial left upper lobe pleural-based opacity is unchanged.   Abdomen and pelvic CT on 08/12/2017 revealed a 9 mm hyperdensity along the expected location of the distal common bile duct near the ampulla with dilatation of the CBD to 17  mm.  Simple renal cysts of the right kidney were noted with a 1.1 cm hyperdense complex appearing lesion possibly representing a hemorrhagic or proteinaceous cyst.  No worrisome features were identified with respect of this complex lesion.    She underwent ERCP on 08/19/2017 for chloedocholithiasis.  One pancreatic stent was placed into the ventral pancreatic duct.  A filling defect consistent with a stone was seen on the cholangiogram. The entire main bile duct and common hepatic duct were moderately dilated.  Choledocholithiasis was found. Complete removal was accomplished by biliary sphincterotomy and basket extraction.  She has iron deficiency anemia.  Ferritin was 18 on  08/10/2016, 22 on 09/07/2016, 69 on 12/07/2016, 81 on 02/23/2017, and 67 on 08/24/2017.  She has chronic renal insufficiency.  Creatinine was 1.86 (CrCl 26 ml/min) on 06/15/2016.  She has a history of left lower extremity DVT.  She is s/p left AKA.  Symptomatically, patient has chronic diarrhea for over 6 months.  She initially attributed it to her taking oral iron. She is no longer taking her oral iron. WBC is 8900 with an ANC of 6600. Hemoglobin is 12.2, hematocrit 37.1, platelets 238,000. Creatinine 1.58 (CrCl 28.9 mL/min).  Ferritin is 67.  Plan: 1.  Labs today: CBC with differential, CMP, ferritin. 2.  Review interval scans.  Discuss plan for reimaging of right kidney lesion on 02/12/2018. 3.  Schedule follow up with GI (Dr. Bonna Gains). 4.  RTC in 6 months for MD assessment, labs (CBC with diff, CMP, ferritin).   Honor Loh, NP  08/24/2017, 2:34 PM   I saw and evaluated the patient, participating in the key portions of the service and reviewing pertinent diagnostic studies and records.  I reviewed the nurse practitioner's note and agree with the findings and the plan.  The assessment and plan were discussed with the patient.  A few questions were asked by the patient and answered.   Nolon Stalls, MD 08/24/2017,2:34 PM

## 2017-08-24 NOTE — Telephone Encounter (Signed)
Left vm to inform pt we would need referral from PCP or CC to schedule apt, message was also left at [facility] for referral

## 2017-08-24 NOTE — Telephone Encounter (Signed)
Pt left vm to set up apt he was not happy getting the answering machine attempted to call him back not able to leave message

## 2017-08-24 NOTE — Progress Notes (Signed)
Patient accompanied by husband today.  States patient got out of hospital one week ago for elevated liver enzymes.  She had a stone removed that was blocking her liver.  Over the past few days she has had several loose stools per day.  Husband states she is red where he has been cleaning her with toilet tissue.  Recommended he use sensitive skin wet wipes.

## 2017-08-25 ENCOUNTER — Ambulatory Visit: Payer: Medicare HMO | Admitting: Gastroenterology

## 2017-08-25 ENCOUNTER — Encounter: Payer: Self-pay | Admitting: Gastroenterology

## 2017-08-25 ENCOUNTER — Telehealth: Payer: Self-pay | Admitting: Nurse Practitioner

## 2017-08-25 ENCOUNTER — Ambulatory Visit (INDEPENDENT_AMBULATORY_CARE_PROVIDER_SITE_OTHER): Payer: Medicare HMO | Admitting: Nurse Practitioner

## 2017-08-25 ENCOUNTER — Ambulatory Visit: Payer: Medicare HMO | Admitting: Family Medicine

## 2017-08-25 ENCOUNTER — Other Ambulatory Visit: Payer: Self-pay

## 2017-08-25 ENCOUNTER — Encounter: Payer: Self-pay | Admitting: Nurse Practitioner

## 2017-08-25 VITALS — BP 128/76 | HR 82 | Temp 98.4°F | Resp 18 | Ht 63.0 in | Wt 184.0 lb

## 2017-08-25 VITALS — BP 136/66 | HR 64 | Temp 98.6°F | Ht 63.0 in | Wt 184.0 lb

## 2017-08-25 DIAGNOSIS — F0281 Dementia in other diseases classified elsewhere with behavioral disturbance: Secondary | ICD-10-CM | POA: Diagnosis not present

## 2017-08-25 DIAGNOSIS — R062 Wheezing: Secondary | ICD-10-CM

## 2017-08-25 DIAGNOSIS — J449 Chronic obstructive pulmonary disease, unspecified: Secondary | ICD-10-CM | POA: Diagnosis not present

## 2017-08-25 DIAGNOSIS — I700 Atherosclerosis of aorta: Secondary | ICD-10-CM | POA: Diagnosis not present

## 2017-08-25 DIAGNOSIS — F02818 Dementia in other diseases classified elsewhere, unspecified severity, with other behavioral disturbance: Secondary | ICD-10-CM

## 2017-08-25 DIAGNOSIS — G301 Alzheimer's disease with late onset: Secondary | ICD-10-CM | POA: Diagnosis not present

## 2017-08-25 DIAGNOSIS — F3342 Major depressive disorder, recurrent, in full remission: Secondary | ICD-10-CM | POA: Diagnosis not present

## 2017-08-25 DIAGNOSIS — R197 Diarrhea, unspecified: Secondary | ICD-10-CM

## 2017-08-25 DIAGNOSIS — Z09 Encounter for follow-up examination after completed treatment for conditions other than malignant neoplasm: Secondary | ICD-10-CM

## 2017-08-25 MED ORDER — CITALOPRAM HYDROBROMIDE 20 MG PO TABS: 40.0000 mg | ORAL_TABLET | Freq: Every day | ORAL | 0 refills | Status: DC

## 2017-08-25 MED ORDER — DONEPEZIL HCL 10 MG PO TABS: 10.0000 mg | ORAL_TABLET | Freq: Every day | ORAL | 3 refills | Status: DC

## 2017-08-25 MED ORDER — ALBUTEROL SULFATE (2.5 MG/3ML) 0.083% IN NEBU
2.5000 mg | INHALATION_SOLUTION | Freq: Once | RESPIRATORY_TRACT | Status: AC
  Administered 2017-08-25: 2.5 mg via RESPIRATORY_TRACT

## 2017-08-25 NOTE — Telephone Encounter (Signed)
Left message for Amy [name]

## 2017-08-25 NOTE — Progress Notes (Signed)
Amy Antigua, MD [address]  [place]  [place], [place] [address]  Main: [phone]  Fax: [phone]   Primary Care Physician: Arnetha Courser, MD  Primary Gastroenterologist:  Dr. Vonda [name]  Chief Complaint  Patient presents with  . Establish Care    Referral from Honor Loh, NP for diarrhea    HPI: Amy [name] is a 82 y.o. female recently at [facility] for abdominal pain, and found to have choledocholithiasis.  Patient had an ERCP on August 19, 2017 by Dr. Paulita Fujita at [facility], as Dr. Allen Norris was out of town.   One pancreatic stent was placed into the ventral                            pancreatic duct.                           - A filling defect consistent with a stone was seen                            on the cholangiogram.                           - The entire main bile duct and common hepatic duct                            were moderately dilated.                           - Choledocholithiasis was found. Complete removal                            was accomplished by biliary sphincterotomy and                            basket extraction. Moderate Sedation:      None Recommendation:           - Avoid aspirin and nonsteroidal anti-inflammatory                            medicines for 3 days.                           - Resume Plavix (clopidogrel) at prior dose in 3                            days.                           - Watch for pancreatitis, bleeding, perforation,                            and cholangitis.                           - Perform a flat plate and upright abdominal x-ray  in 2 weeks. If pancreatic duct stent still in                            place, will need EGD for stent removal.   Patient and husband are reporting chronic history of diarrhea and fecal incontinence.  She was previously on a stool softener, that they stopped over 6 months ago due to  diarrhea.  However this did not help.  Patient is chronically in a wheelchair, and husband has to change her.  She states she feels a bowel movement, but goes in her diapers before she can get to the bathroom.  No blood in stool.  Diarrhea has worsened over the last few days.  She received antibiotics recently for dental work, that she is finishing today.  And also received antibiotics due to choledocholithiasis recently.  Current Outpatient Medications  Medication Sig Dispense Refill  . acetaminophen (TYLENOL) 500 MG tablet Take 1,000 mg by mouth every 6 (six) hours as needed for mild pain, fever or headache.     . albuterol (PROVENTIL HFA;VENTOLIN HFA) 108 (90 Base) MCG/ACT inhaler Inhale 2 puffs into the lungs every 6 (six) hours as needed for wheezing or shortness of breath. 1 Inhaler 2  . atorvastatin (LIPITOR) 20 MG tablet Take 1 tablet (20 mg total) by mouth at bedtime. 90 tablet 0  . benzonatate (TESSALON) 100 MG capsule Take 1 capsule (100 mg total) by mouth 2 (two) times daily as needed for cough. 20 capsule 0  . calcitRIOL (ROCALTROL) 0.25 MCG capsule Take 0.25 mcg by mouth 3 (three) times a week.    . citalopram (CELEXA) 20 MG tablet Take 2 tablets (40 mg total) by mouth at bedtime. 90 tablet 0  . clindamycin (CLEOCIN) 300 MG capsule Take 300 mg by mouth 2 (two) times daily.    . clopidogrel (PLAVIX) 75 MG tablet Take 1 tablet (75 mg total) by mouth daily. 90 tablet 3  . docusate sodium (COLACE) 100 MG capsule Take 100 mg by mouth daily.     [name] donepezil (ARICEPT) 10 MG tablet Take 1 tablet (10 mg total) by mouth at bedtime. 30 tablet 3  . gabapentin (NEURONTIN) 300 MG capsule Take 1 capsule (300 mg total) by mouth 3 (three) times daily. 270 capsule 3  . glucose blood test strip Use as instructed. 100 each 12  . HYDROcodone-acetaminophen (NORCO) 7.5-325 MG tablet Take 1 tablet by mouth 3 (three) times daily as needed. 90 tablet 0  . lisinopril (PRINIVIL,ZESTRIL) 5 MG tablet Take 1 tablet  by mouth daily.    . metoprolol tartrate (LOPRESSOR) 50 MG tablet Take 1 tablet (50 mg total) by mouth 2 (two) times daily. 180 tablet 0  . Multiple Vitamins-Minerals (HAIR SKIN AND NAILS FORMULA) TABS Take by mouth. One tablet daily    . pantoprazole (PROTONIX) 40 MG tablet Take 1 tablet (40 mg total) by mouth daily. 90 tablet 1  . ranitidine (ZANTAC) 150 MG capsule Take 1 capsule (150 mg total) by mouth at bedtime. 90 capsule 3   No current facility-administered medications for this visit.     Allergies as of 08/25/2017 - Review Complete 08/25/2017  Allergen Reaction Noted  . Fentanyl Other (See Comments) 11/05/2014  . Penicillins Itching, Rash, and Other (See Comments) 11/05/2014    ROS:  General: Negative for anorexia, weight loss, fever, chills, fatigue, weakness. ENT: Negative for hoarseness, difficulty swallowing , nasal congestion. CV: Negative for chest pain,  angina, palpitations, dyspnea on exertion, peripheral edema.  Respiratory: Negative for dyspnea at rest, dyspnea on exertion, cough, sputum, wheezing.  GI: See history of present illness. GU:  Negative for dysuria, hematuria, has urinary incontinence, no urinary frequency, nocturnal urination.  Endo: Negative for unusual weight change.    Physical Examination:   BP 136/66   Pulse 64   Temp 98.6 F (37 C) (Oral)   Ht 5\' 3"  (1.6 m)   Wt 184 lb (83.5 kg) Comment: unable to stand to weigh. weighed today previously  BMI 32.59 kg/m   General: Well-nourished, well-developed in no acute distress.  Eyes: No icterus. Conjunctivae pink. Mouth: Oropharyngeal mucosa moist and pink , no lesions erythema or exudate. Neck: Supple, Trachea midline Abdomen: Bowel sounds are normal, nontender, nondistended, no hepatosplenomegaly or masses, no abdominal bruits or hernia , no rebound or guarding.   Neuro: Alert and oriented x 3.  Grossly intact. Skin: Warm and dry, no jaundice.   Psych: Alert and cooperative, normal mood and  affect.   Labs: CMP     Component Value Date/Time   NA 136 08/24/2017 1338   NA 138 01/06/2016 1006   NA 135 08/16/2014 0510   K 3.8 08/24/2017 1338   K 3.7 08/16/2014 0510   CL 103 08/24/2017 1338   CL 102 08/16/2014 0510   CO2 24 08/24/2017 1338   CO2 25 08/16/2014 0510   GLUCOSE 99 08/24/2017 1338   GLUCOSE 98 08/16/2014 0510   BUN 13 08/24/2017 1338   BUN 23 01/06/2016 1006   BUN 20 08/16/2014 0510   CREATININE 1.58 (H) 08/24/2017 1338   CREATININE 1.66 (H) 08/11/2017 1240   CALCIUM 8.0 (L) 08/24/2017 1338   CALCIUM 7.7 (L) 08/16/2014 0510   PROT 6.2 (L) 08/24/2017 1338   PROT 6.1 08/27/2015 1430   PROT 6.1 (L) 10/04/2012 1208   ALBUMIN 2.9 (L) 08/24/2017 1338   ALBUMIN 3.8 08/27/2015 1430   ALBUMIN 3.4 10/04/2012 1208   AST 24 08/24/2017 1338   AST 22 10/04/2012 1208   ALT 17 08/24/2017 1338   ALT 20 10/04/2012 1208   ALKPHOS 123 08/24/2017 1338   ALKPHOS 60 10/04/2012 1208   BILITOT 0.9 08/24/2017 1338   BILITOT 0.6 08/27/2015 1430   BILITOT 0.8 10/04/2012 1208   GFRNONAA 30 (L) 08/24/2017 1338   GFRNONAA 29 (L) 08/11/2017 1240   GFRAA 35 (L) 08/24/2017 1338   GFRAA 34 (L) 08/11/2017 1240   Lab Results  Component Value Date   WBC 8.9 08/24/2017   HGB 12.2 08/24/2017   HCT 37.1 08/24/2017   MCV 92.7 08/24/2017   PLT 238 08/24/2017    Imaging Studies: Ct Abdomen Pelvis Wo Contrast  Result Date: 08/12/2017 CLINICAL DATA:  Right upper quadrant pain since Monday morning. Left oophorectomy a few years ago. Nausea and vomiting with decreased appetite. EXAM: CT CHEST, ABDOMEN AND PELVIS WITHOUT CONTRAST TECHNIQUE: Multidetector CT imaging of the chest, abdomen and pelvis was performed following the standard protocol without IV contrast. COMPARISON:  Chest CT 02/16/2017 FINDINGS: CT CHEST FINDINGS Cardiovascular: Heart size is within normal limits with trace pericardial effusion anteriorly. Left main and three-vessel coronary arteriosclerosis is noted.  Unremarkable noncontrast appearance of the pulmonary vasculature. Mediastinum/Nodes: Normal thyroid gland. Normal branch pattern of the great vessels with moderate atherosclerosis at the origins of the great vessels. No aortic aneurysm. There is mild-to-moderate atherosclerosis along the arch and descending portion. Trachea is midline. Patent mainstem bronchi. Esophagus is unremarkable. Lungs/Pleura: Minimal apical scarring  on the right. Pleural-based nodular opacity in the anterior right upper lobe measuring 7.2 x 4 mm versus 6.6 x 4.9 mm previously is noted. Stable 4 mm opacity in the medial left upper lobe is also unchanged. Mild centrilobular emphysema without acute pneumonic consolidation, effusion or pneumothorax. Bibasilar dependent atelectasis. Mild peribronchial thickening bilaterally. Subsegmental atelectasis and/or scarring is seen the left lower lobe and lingula. No effusion or pneumothorax. Musculoskeletal: No chest wall mass or suspicious bone lesions identified. CT ABDOMEN PELVIS FINDINGS Hepatobiliary: No focal liver abnormality is seen. Status post cholecystectomy. Dilatation of the common bowel duct to 17 mm is identified, previously 10 mm with a subtle hyperdensity measuring 9 mm near the ampulla, previously 6 mm. Although findings could simply represent partial volume averaging of intraluminal contrast within the duodenum, given interval increase in dilatation of the common bowel duct since prior exam and patient's right upper quadrant pain, the possibility of choledocholithiasis is raised. Pancreas: Normal without pancreatic duct dilatation. Spleen: Normal Adrenals/Urinary Tract: Normal bilateral adrenal glands. Status post left nephrectomy with compensatory hypertrophy of the right kidney. A large exophytic simple cyst is noted off the lateral aspect of the right kidney measuring 8.4 x 8.2 x 8.7 cm. Smaller simple cysts are also noted off the right kidney in addition to a complex appearing  hyperdense cyst posteriorly without worrisome appearing features measuring 1.1 cm in diameter. This may represent a proteinaceous or hemorrhagic cyst. Further characterization however is not possible this unenhanced study. Renovascular calcifications are present within the medullary compartment of the right kidney. No obstructive uropathy is seen. No hydroureteronephrosis. The urinary bladder is unremarkable for degree of distention. No calculus or focal mural thickening is noted with respect to the bladder. Stomach/Bowel: Small hiatal hernia. Contrast filled stomach with normal small bowel rotation. No small bowel obstruction or inflammation. Contrast reaches the colon. There is colonic diverticulosis without acute diverticulitis. The appendix is not visualized but no pericecal inflammation is noted. Vascular/Lymphatic: Ectasias of the infrarenal abdominal aorta with moderate degree of aortoiliac and branch vessel atherosclerosis. No lymphadenopathy. Reproductive: Status post hysterectomy.  No adnexal mass. Other: No free air nor free fluid. Ventral supraumbilical fat containing hernia. Musculoskeletal: Moderate burst fracture involving the superior endplate of L1 with 3 mm retropulsion noted contributing to mild central canal stenosis. IMPRESSION: Chest CT: 1. Left main and three-vessel coronary arteriosclerosis. 2. No active pulmonary disease. Mild centrilobular emphysema. Stable appearing pleural-based nodular opacity in the anterior aspect of the right upper lobe measuring 5.6 mm in average. Stable 4 mm medial left upper lobe pleural-based opacity is unchanged. 3. Aortic atherosclerosis. Abdomen and pelvic CT: 1. 9 mm hyperdensity along the expected location of the distal common bile duct near the ampulla with dilatation of the CBD to 17 mm, both increased in appearance since prior. Although findings may simply represent imaging of intraluminal contrast, given patient's right upper quadrant pain and interval  increase in CBD caliber, choledocholithiasis is raised. The patient is status post cholecystectomy. 2. Status post left nephrectomy. Simple renal cysts of the right kidney are noted with 1 hyperdense complex appearing lesion possibly representing a hemorrhagic or proteinaceous cyst measuring approximate 1.1 cm. No worrisome features are identified with respect of this complex lesion. 3. Colonic diverticulosis without acute diverticulitis. No bowel obstruction. 4. Remote appearing burst fracture involving the superior endplate of L1 with 3 mm of retropulsion causing slight central canal stenosis. Electronically Signed   By: Ashley Royalty M.D.   On: 08/12/2017 19:25   Dg  Chest 2 View  Result Date: 08/08/2017 CLINICAL DATA:  Pt states cough, SOB, wheezing and right side chest pain for 3 days. History of bronchitis, diabetes, HTN EXAM: CHEST - 2 VIEW COMPARISON:  Chest x-ray dated 12/08/2016. FINDINGS: Heart size and mediastinal contours are stable. Atherosclerotic changes again noted at the aortic arch. Lungs appear clear. No pleural effusion or pneumothorax seen. Chronic compression fracture deformity within the lower thoracic spine. No acute or suspicious osseous finding. IMPRESSION: 1. No active cardiopulmonary disease. No evidence of pneumonia or pulmonary edema. 2. Aortic atherosclerosis. 3. Chronic compression fracture deformity within the lower thoracic spine. Electronically Signed   By: Franki Cabot M.D.   On: 08/08/2017 16:44   Ct Chest Wo Contrast  Result Date: 08/12/2017 CLINICAL DATA:  Right upper quadrant pain since Monday morning. Left oophorectomy a few years ago. Nausea and vomiting with decreased appetite. EXAM: CT CHEST, ABDOMEN AND PELVIS WITHOUT CONTRAST TECHNIQUE: Multidetector CT imaging of the chest, abdomen and pelvis was performed following the standard protocol without IV contrast. COMPARISON:  Chest CT 02/16/2017 FINDINGS: CT CHEST FINDINGS Cardiovascular: Heart size is within normal  limits with trace pericardial effusion anteriorly. Left main and three-vessel coronary arteriosclerosis is noted. Unremarkable noncontrast appearance of the pulmonary vasculature. Mediastinum/Nodes: Normal thyroid gland. Normal branch pattern of the great vessels with moderate atherosclerosis at the origins of the great vessels. No aortic aneurysm. There is mild-to-moderate atherosclerosis along the arch and descending portion. Trachea is midline. Patent mainstem bronchi. Esophagus is unremarkable. Lungs/Pleura: Minimal apical scarring on the right. Pleural-based nodular opacity in the anterior right upper lobe measuring 7.2 x 4 mm versus 6.6 x 4.9 mm previously is noted. Stable 4 mm opacity in the medial left upper lobe is also unchanged. Mild centrilobular emphysema without acute pneumonic consolidation, effusion or pneumothorax. Bibasilar dependent atelectasis. Mild peribronchial thickening bilaterally. Subsegmental atelectasis and/or scarring is seen the left lower lobe and lingula. No effusion or pneumothorax. Musculoskeletal: No chest wall mass or suspicious bone lesions identified. CT ABDOMEN PELVIS FINDINGS Hepatobiliary: No focal liver abnormality is seen. Status post cholecystectomy. Dilatation of the common bowel duct to 17 mm is identified, previously 10 mm with a subtle hyperdensity measuring 9 mm near the ampulla, previously 6 mm. Although findings could simply represent partial volume averaging of intraluminal contrast within the duodenum, given interval increase in dilatation of the common bowel duct since prior exam and patient's right upper quadrant pain, the possibility of choledocholithiasis is raised. Pancreas: Normal without pancreatic duct dilatation. Spleen: Normal Adrenals/Urinary Tract: Normal bilateral adrenal glands. Status post left nephrectomy with compensatory hypertrophy of the right kidney. A large exophytic simple cyst is noted off the lateral aspect of the right kidney measuring  8.4 x 8.2 x 8.7 cm. Smaller simple cysts are also noted off the right kidney in addition to a complex appearing hyperdense cyst posteriorly without worrisome appearing features measuring 1.1 cm in diameter. This may represent a proteinaceous or hemorrhagic cyst. Further characterization however is not possible this unenhanced study. Renovascular calcifications are present within the medullary compartment of the right kidney. No obstructive uropathy is seen. No hydroureteronephrosis. The urinary bladder is unremarkable for degree of distention. No calculus or focal mural thickening is noted with respect to the bladder. Stomach/Bowel: Small hiatal hernia. Contrast filled stomach with normal small bowel rotation. No small bowel obstruction or inflammation. Contrast reaches the colon. There is colonic diverticulosis without acute diverticulitis. The appendix is not visualized but no pericecal inflammation is noted. Vascular/Lymphatic: Ectasias of  the infrarenal abdominal aorta with moderate degree of aortoiliac and branch vessel atherosclerosis. No lymphadenopathy. Reproductive: Status post hysterectomy.  No adnexal mass. Other: No free air nor free fluid. Ventral supraumbilical fat containing hernia. Musculoskeletal: Moderate burst fracture involving the superior endplate of L1 with 3 mm retropulsion noted contributing to mild central canal stenosis. IMPRESSION: Chest CT: 1. Left main and three-vessel coronary arteriosclerosis. 2. No active pulmonary disease. Mild centrilobular emphysema. Stable appearing pleural-based nodular opacity in the anterior aspect of the right upper lobe measuring 5.6 mm in average. Stable 4 mm medial left upper lobe pleural-based opacity is unchanged. 3. Aortic atherosclerosis. Abdomen and pelvic CT: 1. 9 mm hyperdensity along the expected location of the distal common bile duct near the ampulla with dilatation of the CBD to 17 mm, both increased in appearance since prior. Although findings  may simply represent imaging of intraluminal contrast, given patient's right upper quadrant pain and interval increase in CBD caliber, choledocholithiasis is raised. The patient is status post cholecystectomy. 2. Status post left nephrectomy. Simple renal cysts of the right kidney are noted with 1 hyperdense complex appearing lesion possibly representing a hemorrhagic or proteinaceous cyst measuring approximate 1.1 cm. No worrisome features are identified with respect of this complex lesion. 3. Colonic diverticulosis without acute diverticulitis. No bowel obstruction. 4. Remote appearing burst fracture involving the superior endplate of L1 with 3 mm of retropulsion causing slight central canal stenosis. Electronically Signed   By: Ashley Royalty M.D.   On: 08/12/2017 19:25   Mr Abdomen Mrcp Wo Contrast  Result Date: 08/15/2017 CLINICAL DATA:  Inpatient. History of left nephrectomy in 2016 for renal cancer. Right upper quadrant abdominal pain. Possible choledocholithiasis on recent CT study. EXAM: MRI ABDOMEN WITHOUT CONTRAST  (INCLUDING MRCP) TECHNIQUE: Multiplanar multisequence MR imaging of the abdomen was performed. Heavily T2-weighted images of the biliary and pancreatic ducts were obtained, and three-dimensional MRCP images were rendered by post processing. COMPARISON:  08/12/2017 unenhanced CT abdomen/pelvis. FINDINGS: Lower chest: No acute abnormality at the lung bases. Hepatobiliary: Normal liver size and configuration. Moderate diffuse hepatic steatosis. No definite liver surface irregularity. No liver mass. Cholecystectomy. Mild diffuse intrahepatic biliary ductal dilatation, not appreciably changed from the recent CT. Dilated common bile duct, 13 mm diameter, mildly decreased from 17 mm on the recent CT. There is a low signal intensity 9 mm round filling defect in the mid-to-upper common bile duct, compatible with a choledocholith. No additional CBD filling defects. No biliary strictures. Pancreas: No  pancreatic mass or duct dilation.  No pancreas divisum. Spleen: Normal size. No mass. Adrenals/Urinary Tract: Normal adrenals. Status post left nephrectomy. No mass or fluid collection in the left nephrectomy bed. No right hydronephrosis. There is a 1.2 cm renal cortical lesion in posterior lower right kidney (series 15/image 43), which demonstrates minimal T1 heterogeneous hyperintensity and mildly heterogeneous T2 hyperintensity, incompletely characterized on this noncontrast MRI study. There are several additional homogeneous T2 hyperintense renal lesions in the right kidney, largest an exophytic 8.2 cm lesion in the lateral interpolar right kidney, most compatible with simple renal cysts. Stomach/Bowel: Grossly normal stomach. Visualized small and large bowel is normal caliber, with no bowel wall thickening. Vascular/Lymphatic: Atherosclerotic abdominal aorta with ectatic 2.7 cm infrarenal abdominal aorta. No pathologically enlarged lymph nodes in the abdomen. Other: No abdominal ascites or focal fluid collection. Musculoskeletal: No aggressive appearing focal osseous lesions. Redemonstration of chronic appearing marked T12 vertebral compression fracture, better seen on the recent CT study. IMPRESSION: 1. Solitary 9 mm  choledocholith in the mid to upper common bile duct. Mild diffuse intrahepatic biliary ductal dilatation with dilated CBD (13 mm diameter). 2. Indeterminate small 1.2 cm renal cortical mass in the posterior lower right kidney, incompletely characterized on this noncontrast MRI study, renal neoplasm not excluded. Recommend attention on follow-up MRI (preferred) or CT abdomen without and with IV contrast in 6 months. 3. Ectatic 2.7 cm infrarenal abdominal aorta. Ectatic abdominal aorta at risk for aneurysm development. Recommend followup by ultrasound in 5 years. This recommendation follows ACR consensus guidelines: White Paper of the ACR Incidental Findings Committee II on Vascular Findings. J Am  Coll Radiol 2013; 10:789-794. 4. Moderate diffuse hepatic steatosis. Electronically Signed   By: Ilona Sorrel M.D.   On: 08/15/2017 09:02   Mr 3d Recon At Scanner  Result Date: 08/15/2017 CLINICAL DATA:  Inpatient. History of left nephrectomy in 2016 for renal cancer. Right upper quadrant abdominal pain. Possible choledocholithiasis on recent CT study. EXAM: MRI ABDOMEN WITHOUT CONTRAST  (INCLUDING MRCP) TECHNIQUE: Multiplanar multisequence MR imaging of the abdomen was performed. Heavily T2-weighted images of the biliary and pancreatic ducts were obtained, and three-dimensional MRCP images were rendered by post processing. COMPARISON:  08/12/2017 unenhanced CT abdomen/pelvis. FINDINGS: Lower chest: No acute abnormality at the lung bases. Hepatobiliary: Normal liver size and configuration. Moderate diffuse hepatic steatosis. No definite liver surface irregularity. No liver mass. Cholecystectomy. Mild diffuse intrahepatic biliary ductal dilatation, not appreciably changed from the recent CT. Dilated common bile duct, 13 mm diameter, mildly decreased from 17 mm on the recent CT. There is a low signal intensity 9 mm round filling defect in the mid-to-upper common bile duct, compatible with a choledocholith. No additional CBD filling defects. No biliary strictures. Pancreas: No pancreatic mass or duct dilation.  No pancreas divisum. Spleen: Normal size. No mass. Adrenals/Urinary Tract: Normal adrenals. Status post left nephrectomy. No mass or fluid collection in the left nephrectomy bed. No right hydronephrosis. There is a 1.2 cm renal cortical lesion in posterior lower right kidney (series 15/image 43), which demonstrates minimal T1 heterogeneous hyperintensity and mildly heterogeneous T2 hyperintensity, incompletely characterized on this noncontrast MRI study. There are several additional homogeneous T2 hyperintense renal lesions in the right kidney, largest an exophytic 8.2 cm lesion in the lateral interpolar  right kidney, most compatible with simple renal cysts. Stomach/Bowel: Grossly normal stomach. Visualized small and large bowel is normal caliber, with no bowel wall thickening. Vascular/Lymphatic: Atherosclerotic abdominal aorta with ectatic 2.7 cm infrarenal abdominal aorta. No pathologically enlarged lymph nodes in the abdomen. Other: No abdominal ascites or focal fluid collection. Musculoskeletal: No aggressive appearing focal osseous lesions. Redemonstration of chronic appearing marked T12 vertebral compression fracture, better seen on the recent CT study. IMPRESSION: 1. Solitary 9 mm choledocholith in the mid to upper common bile duct. Mild diffuse intrahepatic biliary ductal dilatation with dilated CBD (13 mm diameter). 2. Indeterminate small 1.2 cm renal cortical mass in the posterior lower right kidney, incompletely characterized on this noncontrast MRI study, renal neoplasm not excluded. Recommend attention on follow-up MRI (preferred) or CT abdomen without and with IV contrast in 6 months. 3. Ectatic 2.7 cm infrarenal abdominal aorta. Ectatic abdominal aorta at risk for aneurysm development. Recommend followup by ultrasound in 5 years. This recommendation follows ACR consensus guidelines: White Paper of the ACR Incidental Findings Committee II on Vascular Findings. J Am Coll Radiol 2013; 10:789-794. 4. Moderate diffuse hepatic steatosis. Electronically Signed   By: Ilona Sorrel M.D.   On: 08/15/2017 09:02  Dg Ercp With Sphincterotomy  Result Date: 08/19/2017 CLINICAL DATA:  Common bile duct calculus EXAM: ERCP TECHNIQUE: Multiple spot images obtained with the fluoroscopic device and submitted for interpretation post-procedure. FLUOROSCOPY TIME:  Fluoroscopy Time:  7 minutes and 11 seconds Radiation Exposure Index (if provided by the fluoroscopic device): Number of Acquired Spot Images: 0 COMPARISON:  None. FINDINGS: Images demonstrate cannulation of the common bile duct and contrast filling the  biliary tree. There are filling defects in the lower common bile duct. IMPRESSION: See above. These images were submitted for radiologic interpretation only. Please see the procedural report for the amount of contrast and the fluoroscopy time utilized. Electronically Signed   By: Marybelle Killings M.D.   On: 08/19/2017 16:33    Assessment and Plan:   Amy [name] is a 82 y.o. y/o female here for posthospitalization follow-up for choledocholithiasis, and underwent ERCP by Dr. Paulita Fujita on August 19, 2017 with biliary sphincterotomy and stone extraction, and pancreatic duct stent placement for choledocholithiasis  Dr.  Paulita Fujita recommendation were to obtain abdominal x-ray in 2 weeks, and if pancreatic stent still present in the pancreatic duct, to remove it with EGD Lab work done yesterday, shows a normal bilirubin which was elevated to 2.9 at the time of her admission No post ERCP pancreatitis present   patient has chronic diarrhea and fecal incontinence Possibly pelvic floor dysfunction However, patient recently had antibiotics, and hospitalization We will check GI panel and C. difficile to rule out infectious causes We will also try Metamucil to help bulk stool Docusate is listed on her medication list, however, patient family state they do not take this and was a previous medication If infectious work-up is negative, and patient continues to have diarrhea, can discuss discontinuing PPI if she is not having any heartburn, as it can cause diarrhea as well.  Dr Amy [name]

## 2017-08-25 NOTE — Patient Instructions (Signed)
-please keep all followup appts with specialists - please encourage inhaler use  Food Choices to Help Relieve Diarrhea, Adult When you have diarrhea, the foods you eat and your eating habits are very important. Choosing the right foods and drinks can help:  Relieve diarrhea.  Replace lost fluids and nutrients.  Prevent dehydration.  What general guidelines should I follow? Relieving diarrhea  Choose foods with less than 2 g or .07 oz. of fiber per serving.  Limit fats to less than 8 tsp (38 g or 1.34 oz.) a day.  Avoid the following: ? Foods and beverages sweetened with high-fructose corn syrup, honey, or sugar alcohols such as xylitol, sorbitol, and mannitol. ? Foods that contain a lot of fat or sugar. ? Fried, greasy, or spicy foods. ? High-fiber grains, breads, and cereals. ? Raw fruits and vegetables.  Eat foods that are rich in probiotics. These foods include dairy products such as yogurt and fermented milk products. They help increase healthy bacteria in the stomach and intestines (gastrointestinal tract, or GI tract).  If you have lactose intolerance, avoid dairy products. These may make your diarrhea worse.  Take medicine to help stop diarrhea (antidiarrheal medicine) only as told by your health care provider. Replacing nutrients  Eat small meals or snacks every 3-4 hours.  Eat bland foods, such as white rice, toast, or baked potato, until your diarrhea starts to get better. Gradually reintroduce nutrient-rich foods as tolerated or as told by your health care provider. This includes: ? Well-cooked protein foods. ? Peeled, seeded, and soft-cooked fruits and vegetables. ? Low-fat dairy products.  Take vitamin and mineral supplements as told by your health care provider. Preventing dehydration   Start by sipping water or a special solution to prevent dehydration (oral rehydration solution, ORS). Urine that is clear or pale yellow means that you are getting enough  fluid.  Try to drink at least 8-10 cups of fluid each day to help replace lost fluids.  You may add other liquids in addition to water, such as clear juice or decaffeinated sports drinks, as tolerated or as told by your health care provider.  Avoid drinks with caffeine, such as coffee, tea, or soft drinks.  Avoid alcohol. What foods are recommended? The items listed may not be a complete list. Talk with your health care provider about what dietary choices are best for you. Grains White rice. White, [place], or pita breads (fresh or toasted), including plain rolls, buns, or bagels. White pasta. Saltine, soda, or graham crackers. Pretzels. Low-fiber cereal. Cooked cereals made with water (such as cornmeal, farina, or cream cereals). Plain muffins. Matzo. Melba toast. Zwieback. Vegetables Potatoes (without the skin). Most well-cooked and canned vegetables without skins or seeds. Tender lettuce. Fruits Apple sauce. Fruits canned in juice. Cooked apricots, cherries, grapefruit, peaches, pears, or plums. Fresh bananas and cantaloupe. Meats and other protein foods Baked or boiled chicken. Eggs. Tofu. Fish. Seafood. Smooth nut butters. Ground or well-cooked tender beef, ham, veal, lamb, pork, or poultry. Dairy Plain yogurt, kefir, and unsweetened liquid yogurt. Lactose-free milk, buttermilk, skim milk, or soy milk. Low-fat or nonfat hard cheese. Beverages Water. Low-calorie sports drinks. Fruit juices without pulp. Strained tomato and vegetable juices. Decaffeinated teas. Sugar-free beverages not sweetened with sugar alcohols. Oral rehydration solutions, if approved by your health care provider. Seasoning and other foods Bouillon, broth, or soups made from recommended foods. What foods are not recommended? The items listed may not be a complete list. Talk with your health care provider  about what dietary choices are best for you. Grains Whole grain, whole wheat, bran, or rye breads, rolls, pastas,  and crackers. Wild or brown rice. Whole grain or bran cereals. Barley. Oats and oatmeal. Corn tortillas or taco shells. Granola. Popcorn. Vegetables Raw vegetables. Fried vegetables. Cabbage, broccoli, Brussels sprouts, artichokes, baked beans, beet greens, corn, kale, legumes, peas, sweet potatoes, and yams. Potato skins. Cooked spinach and cabbage. Fruits Dried fruit, including raisins and dates. Raw fruits. Stewed or dried prunes. Canned fruits with syrup. Meat and other protein foods Fried or fatty meats. Deli meats. Chunky nut butters. Nuts and seeds. Beans and lentils. Berniece Salines. Hot dogs. Sausage. Dairy High-fat cheeses. Whole milk, chocolate milk, and beverages made with milk, such as milk shakes. Half-and-half. Cream. sour cream. Ice cream. Beverages Caffeinated beverages (such as coffee, tea, soda, or energy drinks). Alcoholic beverages. Fruit juices with pulp. Prune juice. Soft drinks sweetened with high-fructose corn syrup or sugar alcohols. High-calorie sports drinks. Fats and oils Butter. Cream sauces. Margarine. Salad oils. Plain salad dressings. Olives. Avocados. Mayonnaise. Sweets and desserts Sweet rolls, doughnuts, and sweet breads. Sugar-free desserts sweetened with sugar alcohols such as xylitol and sorbitol. Seasoning and other foods Honey. Hot sauce. Chili powder. Gravy. Cream-based or milk-based soups. Pancakes and waffles. Summary  When you have diarrhea, the foods you eat and your eating habits are very important.  Make sure you get at least 8-10 cups of fluid each day, or enough to keep your urine clear or pale yellow.  Eat bland foods and gradually reintroduce healthy, nutrient-rich foods as tolerated, or as told by your health care provider.  Avoid high-fiber, fried, greasy, or spicy foods. This information is not intended to replace advice given to you by your health care provider. Make sure you discuss any questions you have with your health care  provider. Document Released: 07/31/2003 Document Revised: 05/07/2016 Document Reviewed: 05/07/2016 Elsevier Interactive Patient Education  [employer].

## 2017-08-25 NOTE — Patient Instructions (Signed)
F/U 1 month Metamucil daily

## 2017-08-25 NOTE — Progress Notes (Addendum)
Name: Amy [name]   MRN: [id number]    DOB: 1935/06/17   Date:08/25/2017       Progress Note  Subjective  Chief Complaint  Chief Complaint  Patient presents with  . Follow-up    HPI   Patient underwent ERCP on 08/19/2017 by [place] MD with placement of pancreatic stent. She daw Dr. Mike Gip on 08/24/2017 with the following plan:   1.  Labs today: CBC with differential, CMP, ferritin. 2.  Review interval scans.  Discuss plan for reimaging of right kidney lesion on 02/12/2018. 3.  Schedule follow up with GI (Dr. Bonna Gains). 4.  RTC in 6 months for MD assessment, labs (CBC with diff, CMP, ferritin). Results: kidney function and albumin off but improved since last draw,  12.2/37.1  Patient has GI appointment with Dr. Bonna Gains this afternoon. Since surgery pain has resolved, denies fevers/chills, nausea, vomiting.  Diarrhea- Has been ongoing for over 9 months, husband notes it started when she was on iron pills which is why he took her off of it 6 months ago. states 3-4 bms a day loose stools, not just watery. Patient states feels she has to go and unable to hold long enough to go to bathroom. Has been on clindamycin. GI follow-up today.   Patient endorses increased anxiety and depression- states feels closed in. Denies sleeping problems. Husband notes she often does not recognize him anymore and thinks he is stepfather; she is typically more aggravated when he asks her to do something and is more tense and easily frustrated. Takes 53m celexa daily. No SI/HI   Patient still has exertional shob, and is not using inhaler.  Medications reconciled.   Patient Active Problem List   Diagnosis Date Noted  . Choledocholithiasis 08/13/2017  . Chronic pain syndrome 04/05/2017  . Depression 11/15/2016  . Iron deficiency anemia 09/08/2016  . Weight loss 07/17/2016  . Anemia 06/29/2016  . Late onset Alzheimer's disease with behavioral disturbance 06/14/2016  . S/P AKA (above knee amputation)  unilateral, left ([facility] 06/08/2016  . Wheelchair dependent 03/11/2016  . 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 Need for immunization against influenza 01/29/2015  . S/p nephrectomy 01/29/2015  . Renal neoplasm 01/15/2015  . Pre-operative cardiovascular examination 01/07/2015  . Abnormal EKG 01/02/2015  . Renal mass 12/05/2014  . GERD (gastroesophageal reflux disease) 11/20/2014  . GAD (generalized anxiety disorder) 11/20/2014  . AB (asthmatic bronchitis) 11/20/2014  . Aortic atherosclerosis ([facility] 11/20/2014  . Cataract 11/20/2014  . Leg pain 11/20/2014  . Continuous opioid dependence ([facility] 11/20/2014  . CKD (chronic kidney disease) stage 4, GFR 15-29 ml/min (HCC) 11/20/2014  . COPD, mild ([facility] 11/20/2014  . Encounter for general adult medical examination without abnormal findings 11/20/2014  . Excessive falling 11/20/2014  . Fatty infiltration of liver 11/20/2014  . HTN (hypertension) 11/20/2014  . Hyperlipidemia 11/20/2014  . Anterior knee pain 11/20/2014  . LBP (low back pain) 11/20/2014  . Malaise and fatigue 11/20/2014  . OP (osteoporosis) 11/20/2014  . Parity 1 11/20/2014  . Abnormal presence of protein in urine 11/20/2014  . PVD (peripheral vascular disease) ([facility] 11/20/2014  . Allergic rhinitis 11/20/2014  . At risk for falling 11/20/2014  . UTI (urinary tract infection) 11/20/2014  . Phantom limb syndrome with pain ([facility] 11/05/2014  . Anxiety 11/05/2014  . Elevated serum creatinine 11/05/2014  . Diabetes mellitus type 2, controlled ([facility] 11/05/2014  . H/O adenomatous polyp of colon 07/27/2010    Social History   Tobacco  Use  . Smoking status: Former Smoker    Packs/day: 1.50    Years: 51.00    Pack years: 76.50    Types: Cigarettes    Last attempt to quit: 05/24/2004    Years since quitting: 13.2  . Smokeless tobacco: Never Used  Substance Use Topics  . Alcohol use: Not Currently     Current Outpatient Medications:  .   acetaminophen (TYLENOL) 500 MG tablet, Take 1,000 mg by mouth every 6 (six) hours as needed for mild pain, fever or headache. , Disp: , Rfl:  .  albuterol (PROVENTIL HFA;VENTOLIN HFA) 108 (90 Base) MCG/ACT inhaler, Inhale 2 puffs into the lungs every 6 (six) hours as needed for wheezing or shortness of breath., Disp: 1 Inhaler, Rfl: 2 .  atorvastatin (LIPITOR) 20 MG tablet, Take 1 tablet (20 mg total) by mouth at bedtime., Disp: 90 tablet, Rfl: 0 .  benzonatate (TESSALON) 100 MG capsule, Take 1 capsule (100 mg total) by mouth 2 (two) times daily as needed for cough., Disp: 20 capsule, Rfl: 0 .  calcitRIOL (ROCALTROL) 0.25 MCG capsule, Take 0.25 mcg by mouth 3 (three) times a week., Disp: , Rfl:  .  citalopram (CELEXA) 20 MG tablet, TAKE 1 TABLET (20 MG TOTAL) BY MOUTH AT BEDTIME., Disp: 90 tablet, Rfl: 0 .  clindamycin (CLEOCIN) 300 MG capsule, Take 300 mg by mouth 2 (two) times daily., Disp: , Rfl:  .  clopidogrel (PLAVIX) 75 MG tablet, Take 1 tablet (75 mg total) by mouth daily., Disp: 90 tablet, Rfl: 3 .  docusate sodium (COLACE) 100 MG capsule, Take 100 mg by mouth daily. , Disp: , Rfl:  .  donepezil (ARICEPT) 5 MG tablet, Take 1 tablet by mouth at bedtime., Disp: , Rfl:  .  gabapentin (NEURONTIN) 300 MG capsule, Take 1 capsule (300 mg total) by mouth 3 (three) times daily., Disp: 270 capsule, Rfl: 3 .  glucose blood test strip, Use as instructed., Disp: 100 each, Rfl: 12 .  HYDROcodone-acetaminophen (NORCO) 7.5-325 MG tablet, Take 1 tablet by mouth 3 (three) times daily as needed., Disp: 90 tablet, Rfl: 0 .  lisinopril (PRINIVIL,ZESTRIL) 5 MG tablet, Take 1 tablet by mouth daily., Disp: , Rfl:  .  metoprolol tartrate (LOPRESSOR) 50 MG tablet, Take 1 tablet (50 mg total) by mouth 2 (two) times daily., Disp: 180 tablet, Rfl: 0 .  Multiple Vitamins-Minerals (HAIR SKIN AND NAILS FORMULA) TABS, Take by mouth. One tablet daily, Disp: , Rfl:  .  pantoprazole (PROTONIX) 40 MG tablet, Take 1 tablet (40  mg total) by mouth daily., Disp: 90 tablet, Rfl: 1 .  QUEtiapine (SEROQUEL) 50 MG tablet, Take 50 mg by mouth at bedtime. , Disp: , Rfl:  .  ranitidine (ZANTAC) 150 MG capsule, Take 1 capsule (150 mg total) by mouth at bedtime., Disp: 90 capsule, Rfl: 3  Current Facility-Administered Medications:  .  albuterol (PROVENTIL) (2.5 MG/3ML) 0.083% nebulizer solution 2.5 mg, 2.5 mg, Nebulization, Once, , Bethel Born, NP  Allergies  Allergen Reactions  . Fentanyl Other (See Comments)    urine retention  . Penicillins Itching, Rash and Other (See Comments)    Has patient had a PCN reaction causing immediate rash, facial/tongue/throat swelling, SOB or lightheadedness with hypotension: Yes Has patient had a PCN reaction causing severe rash involving mucus membranes or skin necrosis: No Has patient had a PCN reaction that required hospitalization: No Has patient had a PCN reaction occurring within the last 10 years: Yes If all of the  above answers are "NO", then may proceed with Cephalosporin use.    ROS  No other specific complaints in a complete review of systems (except as listed in HPI above).  Objective  Vitals:   08/25/17 1109  BP: 128/76  Pulse: 82  Resp: 18  Temp: 98.4 F (36.9 C)  TempSrc: Oral  SpO2: 96%  Weight: 184 lb (83.5 kg)  Height: 5' 3" (1.6 m)    Body mass index is 32.59 kg/m.  Nursing Note and Vital Signs reviewed.  Physical Exam  Constitutional: Patient appears well-developed and well-nourished. Obese No distress.  Cardiovascular: Normal rate, regular rhythm, S1/S2 present.  No murmur or rub heard.  Pulmonary/Chest: Effort normal and breath sounds expiratory wheezing and rhonchi bilaterally throughout; post-breathing treatment still rhonchi throughout. No respiratory distress or retractions. Abdominal: Soft and non-tender, bowel sounds present  Psychiatric: Patient has a normal mood and affect. behavior is normal. Judgment and thought content  normal.  Results for orders placed or performed in visit on 08/24/17 (from the past 72 hour(s))  CBC with Differential     Status: Abnormal   Collection Time: 08/24/17  1:38 PM  Result Value Ref Range   WBC 8.9 3.6 - 11.0 K/uL   RBC 4.00 3.80 - 5.20 MIL/uL   Hemoglobin 12.2 12.0 - 16.0 g/dL   HCT 37.1 35.0 - 47.0 %   MCV 92.7 80.0 - 100.0 fL   MCH 30.4 26.0 - 34.0 pg   MCHC 32.8 32.0 - 36.0 g/dL   RDW 16.0 (H) 11.5 - 14.5 %   Platelets 238 150 - 440 K/uL   Neutrophils Relative % 74 %   Neutro Abs 6.6 (H) 1.4 - 6.5 K/uL   Lymphocytes Relative 14 %   Lymphs Abs 1.3 1.0 - 3.6 K/uL   Monocytes Relative 7 %   Monocytes Absolute 0.6 0.2 - 0.9 K/uL   Eosinophils Relative 3 %   Eosinophils Absolute 0.2 0 - 0.7 K/uL   Basophils Relative 2 %   Basophils Absolute 0.1 0 - 0.1 K/uL    Comment: Performed at [facility], [address] [place], [place] [address]  Ferritin     Status: None   Collection Time: 08/24/17  1:38 PM  Result Value Ref Range   Ferritin 67 11 - 307 ng/mL    Comment: Performed at [facility], [address] [place], [place] [address]  Comprehensive metabolic panel     Status: Abnormal   Collection Time: 08/24/17  1:38 PM  Result Value Ref Range   Sodium 136 135 - 145 mmol/L   Potassium 3.8 3.5 - 5.1 mmol/L   Chloride 103 101 - 111 mmol/L   CO2 24 22 - 32 mmol/L   Glucose, Bld 99 65 - 99 mg/dL   BUN 13 6 - 20 mg/dL   Creatinine, Ser 1.58 (H) 0.44 - 1.00 mg/dL   Calcium 8.0 (L) 8.9 - 10.3 mg/dL   Total Protein 6.2 (L) 6.5 - 8.1 g/dL   Albumin 2.9 (L) 3.5 - 5.0 g/dL   AST 24 15 - 41 U/L   ALT 17 14 - 54 U/L   Alkaline Phosphatase 123 38 - 126 U/L   Total Bilirubin 0.9 0.3 - 1.2 mg/dL   GFR calc non Af Amer 30 (L) >60 mL/min   GFR calc Af Amer 35 (L) >60 mL/min    Comment: (NOTE) The eGFR has been calculated using the CKD EPI equation. This calculation has not been validated  in all clinical situations. eGFR's persistently <60 mL/min  signify possible Chronic Kidney Disease.    Anion gap 9 5 - 15    Comment: Performed at [facility], [address] [place], [place] [address]   Diabetic Foot Form - Detailed   Diabetic Foot Exam - detailed Diabetic Foot exam was performed with the following findings:  Yes 08/25/2017 12:09 PM  Visual Foot Exam completed.:  Yes  Pulse Foot Exam completed.:  Yes  Sensory Foot Exam Completed.:  Yes Semmes-Weinstein Monofilament Test   Comments:  Completed on right foot only, left leg amputation    Depression screen PHQ 2/9 08/25/2017  Decreased Interest 1  Down, Depressed, Hopeless 3  PHQ - 2 Score 4  Altered sleeping 1  Tired, decreased energy 2  Change in appetite 0  Feeling bad or failure about yourself  3  Trouble concentrating 3  Moving slowly or fidgety/restless 0  Suicidal thoughts 0  PHQ-9 Score 13  Difficult doing work/chores Somewhat difficult  Some recent data might be hidden    Assessment & Plan  1. Hospital discharge follow-up - medication rec completed   2. COPD, moderate ([place]) - Encourage inhaler use - Ambulatory referral to Pulmonology  3. Wheezing - albuterol (PROVENTIL) (2.5 MG/3ML) 0.083% nebulizer solution 2.5 mg  4. Recurrent major depressive disorder ([place]) - continue celexa increase dose, take off seroquel   5. Aortic atherosclerosis (HCC) - on plavix and atorvastatin  - Ambulatory referral to Cardiology  6. Diarrhea - Keep GI follow-up  - Diet information given   7. Late onset Alzheimer's disease with behavioral disturbance - may add namenda as needed - donepezil (ARICEPT) 10 MG tablet; Take 1 tablet (10 mg total) by mouth at bedtime.  Dispense: 30 tablet; Refill: 3  -Red flags and when to present for emergency care or RTC including fever >101.57F, chest pain, shortness of breath, new/worsening/un-resolving symptoms, reviewed with patient at time of visit. Follow up and care instructions discussed and provided in  AVS. -Reviewed Health Maintenance: updated foot exam  I have reviewed this encounter including the documentation in this note and/or discussed this patient with the provider, Suezanne Cheshire DNP AGNP-C. I am certifying that I agree with the content of this note as supervising physician. Enid Derry, [facility] 08/26/2017, 5:29 PM

## 2017-08-25 NOTE — Progress Notes (Signed)
Pt notified that she may just go to hospital for her x-ray which will be due 09/15/17. The ordered has been placed.

## 2017-08-26 ENCOUNTER — Other Ambulatory Visit: Payer: Self-pay | Admitting: *Deleted

## 2017-08-26 NOTE — Patient Outreach (Addendum)
[place] [facility]) Care Management  08/26/2017  Amy [name] [birth year] [id number]   Phone call to patient's spouse to follow up on referral for personal care services.  Per patient's spouse he has spoken to [place] from [employer] but has not scheduled an intake due to multiple appointments already in place. Per patient's spouse, he will contact [place] next week to schedule a intake appointment.  This Education officer, museum will follow up with patient within 2 weeks.    Sheralyn Boatman [facility] Care Management [phone]

## 2017-08-29 ENCOUNTER — Other Ambulatory Visit
Admission: RE | Admit: 2017-08-29 | Discharge: 2017-08-29 | Disposition: A | Discharge status: Home / Self Care | Payer: Medicare HMO | Source: Ambulatory Visit | Attending: Gastroenterology | Admitting: Gastroenterology

## 2017-08-29 DIAGNOSIS — R197 Diarrhea, unspecified: Secondary | ICD-10-CM | POA: Diagnosis not present

## 2017-08-29 LAB — GASTROINTESTINAL PANEL BY PCR, STOOL (REPLACES STOOL CULTURE)

## 2017-08-29 LAB — C DIFFICILE QUICK SCREEN W PCR REFLEX
C DIFFICILE (CDIFF) INTERP: NOT DETECTED
C DIFFICILE (CDIFF) TOXIN: NEGATIVE
C DIFFICLE (CDIFF) ANTIGEN: NEGATIVE

## 2017-08-29 NOTE — Telephone Encounter (Signed)
Record printed out and Mr. Chea picked up

## 2017-08-31 ENCOUNTER — Encounter: Payer: Self-pay | Admitting: *Deleted

## 2017-08-31 ENCOUNTER — Other Ambulatory Visit: Payer: Self-pay | Admitting: *Deleted

## 2017-08-31 NOTE — Patient Outreach (Addendum)
Amy [name]) Care Management   09/01/2017  Amy [name] 1936-04-24 [id number]  Amy [name] is an 82 y.o. female  Subjective:  Patient reports feeling pretty good on this morning . Patient discussed her memory is not good, but her husband helps her out with most everything .  Patient spouse discuss patient at times thinks that he is her stepfather.  Spouse discussed patient recent office visit to GI doctor,and awaiting results of stool samples collected.  Patient spouse reports patient upcoming new appointment with pulmonary in the next week, and appointment with new PCP Dr. Sanda Klein this month.    Objective:  BP 138/80 (BP Location: Right Arm, Patient Position: Sitting, Cuff Size: Large)   Pulse (!) 58   Resp 20   Ht 1.575 m (5\' 2" )   Wt 184 lb (83.5 kg)   SpO2 95%   BMI 33.65 kg/m  Review of Systems  Constitutional: Negative.   HENT: Negative.   Eyes: Negative.   Respiratory: Negative.   Gastrointestinal: Negative.   Genitourinary: Negative.   Musculoskeletal: Positive for falls and joint pain.  Skin: Negative.   Neurological: Negative.   Endo/Heme/Allergies: Negative.   Psychiatric/Behavioral: Positive for memory loss.       Reports having problems with memory    Physical Exam  Constitutional: She is oriented to person, place, and time. She appears well-developed and well-nourished.  Cardiovascular: Normal rate.  Respiratory: Effort normal.  GI: Soft. Bowel sounds are normal.  Musculoskeletal:  Left AKA   Neurological: She is alert and oriented to person, place, and time.  Skin: Skin is warm and dry.  Psychiatric: She has a normal mood and affect. Her behavior is normal. Judgment and thought content normal.    Encounter Medications:   Outpatient Encounter Medications as of 08/31/2017  Medication Sig Note  . acetaminophen (TYLENOL) 500 MG tablet Take 1,000 mg by mouth every 6 (six) hours as needed for mild pain, fever or headache.     . albuterol (PROVENTIL HFA;VENTOLIN HFA) 108 (90 Base) MCG/ACT inhaler Inhale 2 puffs into the lungs every 6 (six) hours as needed for wheezing or shortness of breath.   [name] atorvastatin (LIPITOR) 20 MG tablet Take 1 tablet (20 mg total) by mouth at bedtime.   . citalopram (CELEXA) 20 MG tablet Take 2 tablets (40 mg total) by mouth at bedtime.   . clopidogrel (PLAVIX) 75 MG tablet Take 1 tablet (75 mg total) by mouth daily.   [name] donepezil (ARICEPT) 10 MG tablet Take 1 tablet (10 mg total) by mouth at bedtime.   . gabapentin (NEURONTIN) 300 MG capsule Take 1 capsule (300 mg total) by mouth 3 (three) times daily.   [name] glucose blood test strip Use as instructed.   [name] HYDROcodone-acetaminophen (NORCO) 7.5-325 MG tablet Take 1 tablet by mouth 3 (three) times daily as needed.   . metoprolol tartrate (LOPRESSOR) 50 MG tablet Take 1 tablet (50 mg total) by mouth 2 (two) times daily.   . pantoprazole (PROTONIX) 40 MG tablet Take 1 tablet (40 mg total) by mouth daily.   . ranitidine (ZANTAC) 150 MG capsule Take 1 capsule (150 mg total) by mouth at bedtime.   . benzonatate (TESSALON) 100 MG capsule Take 1 capsule (100 mg total) by mouth 2 (two) times daily as needed for cough. (Patient not taking: Reported on 08/31/2017)   . calcitRIOL (ROCALTROL) 0.25 MCG capsule Take 0.25 mcg by mouth 3 (three) times a week.   . clindamycin (CLEOCIN) 300  MG capsule Take 300 mg by mouth 2 (two) times daily.   [name] docusate sodium (COLACE) 100 MG capsule Take 100 mg by mouth daily.    [name] lisinopril (PRINIVIL,ZESTRIL) 5 MG tablet Take 1 tablet by mouth daily. 08/31/2017: Patient reports medication was discontinued by kidney doctor   . Multiple Vitamins-Minerals (HAIR SKIN AND NAILS FORMULA) TABS Take by mouth. One tablet daily    No facility-administered encounter medications on file as of 08/31/2017.     Functional Status:   In your present state of health, do you have any difficulty performing the following activities: 08/22/2017  08/13/2017  Hearing? N N  Vision? N N  Comment - -  Difficulty concentrating or making decisions? Y N  Walking or climbing stairs? Y Y  Comment non ambulatory, needs tranfer assist -  Dressing or bathing? [name]  Comment husband helps -  Doing errands, shopping? [name]  Preparing Food and eating ? Y -  Comment husband does meal preparation -  Using the Toilet? Y -  Comment needs assist to commode -  In the past six months, have you accidently leaked urine? Y -  Do you have problems with loss of bowel control? Y -  Managing your Finances? Y -  Comment husband helps -  Housekeeping or managing your Housekeeping? Y -  Comment husband helps -  Some recent data might be hidden    Fall/Depression Screening:    Fall Risk  08/31/2017 08/11/2017 08/08/2017  Falls in the past year? Yes Yes Yes  Number falls in past yr: 2 or more 2 or more 2 or more  Injury with Fall? No No No  Risk Factor Category  - - -  Risk for fall due to : History of fall(s) - -  Follow up Falls prevention discussed;Education provided - -   PHQ 2/9 Scores 08/31/2017 08/25/2017 08/11/2017 08/08/2017 06/21/2017 06/15/2017 04/05/2017  PHQ - 2 Score 1 4 0 1 0 0 0  PHQ- 9 Score 8 13 - - - - -  Exception Documentation - - - - - - (No Data)  Not completed - - - - - - -    Assessment:   Initial home visit - patient husband present .   Falls - No fall in the last 2 weeks, home safety evaluation completed, husband any many fall prevention measures in place, low bed, rails at bed and commode. Discussed medical alert system he declines need at present  Patient reports if she needs husband when he is not there she uses the phone.and does not get of out chair.when alone.  Diarrhea- reports stools are soft but still about 4 times a day,  Recent gallstones - patient denies pain, no nausea, tolerating diet.  COPD- needs reinforcement of use of inhaler as needed for shortness of breath/wheezing. Per report patient experiences shortness of  breath and wheezing with activity as transferring to and from commode and bed.  Medications - husband manages medication, using weekly organizer ,noted patient taking buspirone 10 mg three times a day, not on medication list.  also noted Lisinopril listed on medication list husband reports patient not taking states Kidney Doctor stopped medication. Depression [facility] Lewanda Rife -, Positive for depression . Reviewed new medication changes of Celexa and Aricept, husband has printed listed from PCP office, reviewed Generic names on bottle.   Personal care service - Husband reports he has spoken with Helene Kelp from  Home care providers regarding personal care service and set up home  visit in the next week. Patient spouse discussed concerns about what will happen when he is no longer to able to help with lifting patient asking about what resources are available in the community, as patient does not want to go to a "home". Patient does not have Advanced Directive, husband interested in completing provided contact number for [facility] that as community service will be able to assist with completing.    Plan  Provided and reviewed [facility] welcome packet.  Will plan follow up telephone call in the next 2 weeks. Will send PCP office message regarding medication patient taking that is not on list, Buspirone 10 mg three times a day  and Lisinopril on list that patient is not taking .  Provided and reviewed COPD magnet zones  and reinforced use of inhaler for wheezing and shortness of breath .  Will discuss with [facility] social worker Chrystal Land  patient spouse concern regarding future care needs.  Will send PCP visit note.   [facility] CM Care Plan Problem One     Most Recent Value  Care Plan Problem One  High risk for readmission related to recent hospital admission for elevated liver enzymes, gall bladder problem , patient high fall risk .   Role Documenting the Problem One  Care Management  [place] for Problem One  Active  [facility] Long Term Goal   Patient will not experience a hospital admission in the next 60  days   [facility] Long Term Goal Start Date  08/16/17  Interventions for Problem One Long Term Goal  Advised regarding taking medications as prescribed, notify MD of any new concern or worsening of symptoms of pain , shortness of breath . [name]   [facility] CM Short Term Goal #1   Patient will attend all medical appointments in the next 14 days   [facility] CM Short Term Goal #1 Start Date  08/22/17 Barrie Folk revised ]  Interventions for Short Term Goal #1  Discussed with husband , upcoming medical appointments, pulmonary, PCP   [facility] CM Short Term Goal #2   Patient/ Husband will report no falls in the next 30 days   [facility] CM Short Term Goal #2 Start Date  08/16/17  Interventions for Short Term Goal #2  Provided and reviewed EMMI handout on preventing falls in adults    [facility] CM Care Plan Problem Two     Most Recent Value  Care Plan Problem Two  Knowledge related COPD management   Role Documenting the Problem Two  Care Management Coordinator  Care Plan for Problem Two  Active  [facility] CM Short Term Goal #1   Patient will report use of rescue inhaler as needed in the next 30 days   [facility] CM Short Term Goal #1 Start Date  09/01/17  Interventions for Short Term Goal #2   Provided COPD magnet, review yellow zone symptoms and action plan, encouraged use of inhaler for shortness of breath wheezing. Review demonstrate  how to use.       Joylene Draft, RN, [place] Management Coordinator  [phone]- Mobile [phone]- Toll Free Main Office

## 2017-09-01 ENCOUNTER — Telehealth: Payer: Self-pay

## 2017-09-01 ENCOUNTER — Emergency Department
Admission: EM | Admit: 2017-09-01 | Discharge: 2017-09-01 | Disposition: A | Discharge status: Home / Self Care | Payer: Medicare HMO | Attending: Emergency Medicine | Admitting: Emergency Medicine

## 2017-09-01 ENCOUNTER — Other Ambulatory Visit: Payer: Self-pay

## 2017-09-01 ENCOUNTER — Encounter: Payer: Self-pay | Admitting: *Deleted

## 2017-09-01 ENCOUNTER — Ambulatory Visit (INDEPENDENT_AMBULATORY_CARE_PROVIDER_SITE_OTHER): Payer: Medicare HMO | Admitting: Family Medicine

## 2017-09-01 ENCOUNTER — Encounter: Payer: Self-pay | Admitting: Family Medicine

## 2017-09-01 ENCOUNTER — Emergency Department: Payer: Medicare HMO

## 2017-09-01 VITALS — BP 136/82 | HR 62 | Temp 98.4°F | Resp 16

## 2017-09-01 DIAGNOSIS — G301 Alzheimer's disease with late onset: Secondary | ICD-10-CM | POA: Insufficient documentation

## 2017-09-01 DIAGNOSIS — J449 Chronic obstructive pulmonary disease, unspecified: Secondary | ICD-10-CM | POA: Insufficient documentation

## 2017-09-01 DIAGNOSIS — I129 Hypertensive chronic kidney disease with stage 1 through stage 4 chronic kidney disease, or unspecified chronic kidney disease: Secondary | ICD-10-CM | POA: Diagnosis not present

## 2017-09-01 DIAGNOSIS — R079 Chest pain, unspecified: Secondary | ICD-10-CM | POA: Insufficient documentation

## 2017-09-01 DIAGNOSIS — F419 Anxiety disorder, unspecified: Secondary | ICD-10-CM

## 2017-09-01 DIAGNOSIS — Z87891 Personal history of nicotine dependence: Secondary | ICD-10-CM | POA: Diagnosis not present

## 2017-09-01 DIAGNOSIS — E1122 Type 2 diabetes mellitus with diabetic chronic kidney disease: Secondary | ICD-10-CM | POA: Diagnosis not present

## 2017-09-01 DIAGNOSIS — Z85528 Personal history of other malignant neoplasm of kidney: Secondary | ICD-10-CM | POA: Diagnosis not present

## 2017-09-01 DIAGNOSIS — N184 Chronic kidney disease, stage 4 (severe): Secondary | ICD-10-CM | POA: Diagnosis not present

## 2017-09-01 DIAGNOSIS — Z89612 Acquired absence of left leg above knee: Secondary | ICD-10-CM | POA: Insufficient documentation

## 2017-09-01 DIAGNOSIS — F0281 Dementia in other diseases classified elsewhere with behavioral disturbance: Secondary | ICD-10-CM | POA: Diagnosis not present

## 2017-09-01 DIAGNOSIS — R0789 Other chest pain: Secondary | ICD-10-CM | POA: Diagnosis not present

## 2017-09-01 DIAGNOSIS — R001 Bradycardia, unspecified: Secondary | ICD-10-CM | POA: Diagnosis not present

## 2017-09-01 DIAGNOSIS — E119 Type 2 diabetes mellitus without complications: Secondary | ICD-10-CM | POA: Insufficient documentation

## 2017-09-01 DIAGNOSIS — R1013 Epigastric pain: Secondary | ICD-10-CM

## 2017-09-01 LAB — COMPREHENSIVE METABOLIC PANEL
ALT: 13 U/L — AB (ref 14–54)
AST: 24 U/L (ref 15–41)
Albumin: 3.1 g/dL — ABNORMAL LOW (ref 3.5–5.0)
Alkaline Phosphatase: 115 U/L (ref 38–126)
Anion gap: 7 (ref 5–15)
BUN: 13 mg/dL (ref 6–20)
CALCIUM: 8.3 mg/dL — AB (ref 8.9–10.3)
CO2: 26 mmol/L (ref 22–32)
CREATININE: 1.33 mg/dL — AB (ref 0.44–1.00)
Chloride: 104 mmol/L (ref 101–111)
GFR, EST AFRICAN AMERICAN: 43 mL/min — AB (ref >60)
GFR, EST NON AFRICAN AMERICAN: 37 mL/min — AB (ref >60)
Glucose, Bld: 132 mg/dL — ABNORMAL HIGH (ref 65–99)
Potassium: 3.5 mmol/L (ref 3.5–5.1)
Sodium: 137 mmol/L (ref 135–145)
Total Bilirubin: 1.3 mg/dL — ABNORMAL HIGH (ref 0.3–1.2)
Total Protein: 6.4 g/dL — ABNORMAL LOW (ref 6.5–8.1)

## 2017-09-01 LAB — CBC
HEMATOCRIT: 36.9 % (ref 35.0–47.0)
HEMOGLOBIN: 12.3 g/dL (ref 12.0–16.0)
MCH: 30.7 pg (ref 26.0–34.0)
MCHC: 33.2 g/dL (ref 32.0–36.0)
MCV: 92.4 fL (ref 80.0–100.0)
Platelets: 222 10*3/uL (ref 150–440)
RBC: 4 MIL/uL (ref 3.80–5.20)
RDW: 15.8 % — ABNORMAL HIGH (ref 11.5–14.5)
WBC: 12.2 10*3/uL — ABNORMAL HIGH (ref 3.6–11.0)

## 2017-09-01 LAB — TROPONIN I

## 2017-09-01 MED ORDER — LORAZEPAM 2 MG/ML IJ SOLN
1.0000 mg | Freq: Once | INTRAMUSCULAR | Status: AC
  Administered 2017-09-01: 1 mg via INTRAVENOUS
  Filled 2017-09-01: qty 1

## 2017-09-01 MED ORDER — GI COCKTAIL ~~LOC~~
30.0000 mL | Freq: Once | ORAL | Status: AC
  Administered 2017-09-01: 30 mL via ORAL
  Filled 2017-09-01: qty 30

## 2017-09-01 NOTE — ED Notes (Signed)
Patient transported to X-ray 

## 2017-09-01 NOTE — ED Triage Notes (Addendum)
Pt comes via POV with c/o mid central chest pain. Pt went to primary care physician for the chest pain. Multiple EKGs were done and all were abnormal per pt. Pt was also given 4 baby aspirin. Pt states the chest pain started last night and had some SOB and possible fever. Pt states 9/10 pain. Pt is alert. Respirations even and unlabored at this time.

## 2017-09-01 NOTE — Discharge Instructions (Addendum)
Please follow the diet recommendations for reflux disease; this should help with your symptoms.  Please make a follow-up appointment with your primary care physician.  These make an appointment with the cardiologist, for further evaluation of your chest pain.  Return to the emergency department if you develop severe pain, shortness of breath, palpitations, sweaty feeling, nausea or vomiting, or any other symptoms concerning to you.

## 2017-09-01 NOTE — Telephone Encounter (Signed)
Copied from [place] [id number]. Topic: Appointment Scheduling - Scheduling Inquiry for Clinic >> Sep 01, 2017  3:20 PM Margot Ables wrote: Reason for CRM: pt spouse states ER told her to f/u asap w/in 3 days with Dr. Sanda Klein. No appts available with Dr. Sanda Klein. Please advise. >> Sep 01, 2017  3:49 PM Karene Fry P wrote: Please tell me where to over book this patient. Amy [name] is completely booked as well. Thank you   Ok to double book? Please advise

## 2017-09-01 NOTE — Progress Notes (Signed)
BP 136/82   Pulse 62   Temp 98.4 F (36.9 C) (Oral)   Resp 16   SpO2 95%    Subjective:    Patient ID: Amy [name], female    DOB: March 18, 1936, 82 y.o.   MRN: [id number]  HPI: Amy [name] is a 82 y.o. female  Chief Complaint  Patient presents with  . Abdominal Pain    in the epigastric region. Pt states that she is gagging but not throwing up. Pt has had ulcers abd states that it feels simular. The pain started last night.    HPI  Patient is here for f/u However, she is having pain in the epigastric region She has a history of stomach ulcers and says that this feels similar The pain started abruptly last night The pain is 8 out of 10 in intensity She is nauseated No SHOB During the visit, her pain level went to a reported 10 out of 10  Depression screen [facility] 2/9 09/15/2017 09/01/2017 08/31/2017 08/25/2017 08/11/2017  Decreased Interest 0 0 - 1 0  Down, Depressed, Hopeless 0 2 1 3  0  PHQ - 2 Score 0 2 1 4  0  Altered sleeping - - 1 1 -  Tired, decreased energy - - 2 2 -  Change in appetite - - 0 0 -  Feeling bad or failure about yourself  - - 3 3 -  Trouble concentrating - - 1 3 -  Moving slowly or fidgety/restless - - 0 0 -  Suicidal thoughts - - 0 0 -  PHQ-9 Score - - 8 13 -  Difficult doing work/chores - - Somewhat difficult Somewhat difficult -  Some recent data might be hidden    Relevant past medical, surgical, family and social history reviewed Past Medical History:  Diagnosis Date  . Anemia    low iron  . Anxiety   . Arthritis   . Bilateral cataracts   . Bronchitis    hx of   . Cancer of kidney, left ([place]) 2017  . Cataract   . Chronic kidney disease   . Depression   . Diabetes mellitus without complication ([facility])   . Dry cough   . 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 GERD (gastroesophageal reflux disease)   . Headache   . History of frequent urinary tract infections   . Hyperlipidemia   .  Hypertension   . Left renal mass   . Numbness and tingling    right hand   . Obesity   . Peripheral artery disease ([place])   . Urinary frequency   . Urinary incontinence   . Wheezing    Past Surgical History:  Procedure Laterality Date  . ABDOMINAL HYSTERECTOMY  1978  . CARPAL TUNNEL RELEASE Bilateral   . CHOLECYSTECTOMY    . COLONOSCOPY    . COLONOSCOPY WITH ESOPHAGOGASTRODUODENOSCOPY (EGD)    . ERCP N/A 08/19/2017   Procedure: ENDOSCOPIC RETROGRADE CHOLANGIOPANCREATOGRAPHY (ERCP);  Surgeon: Arta Silence, MD;  Location: [facility] ENDOSCOPY;  Service: Endoscopy;  Laterality: N/A;  . LEG AMPUTATION     above the knee / left   . ROBOT ASSISTED LAPAROSCOPIC NEPHRECTOMY Left 01/15/2015   Procedure: ROBOTIC ASSISTED LAPAROSCOPIC RADICAL NEPHRECTOMY;  Surgeon: Alexis Frock, MD;  Location: [facility] ORS;  Service: Urology;  Laterality: Left;  . stent placement right leg      Family History  Problem Relation Age of Onset  .  Cancer Mother        Stomach  . Cancer Father   . Diabetes Brother   . Cancer Brother    Social History   Tobacco Use  . Smoking status: Former Smoker    Packs/day: 1.50    Years: 51.00    Pack years: 76.50    Types: Cigarettes    Last attempt to quit: 05/24/2004    Years since quitting: 13.3  . Smokeless tobacco: Never Used  Substance Use Topics  . Alcohol use: Not Currently  . Drug use: No    Interim medical history since last visit reviewed. Allergies and medications reviewed  Review of Systems Per HPI unless specifically indicated above     Objective:    BP 136/82   Pulse 62   Temp 98.4 F (36.9 C) (Oral)   Resp 16   SpO2 95%   Wt Readings from Last 3 Encounters:  09/15/17 184 lb (83.5 kg)  09/05/17 184 lb (83.5 kg)  09/01/17 184 lb (83.5 kg)    Physical Exam  Constitutional: She appears well-developed and well-nourished. She appears distressed (uncomfortable).  Elderly female, seated in wheelchair, uncomfortable  Cardiovascular: Normal rate  and regular rhythm.  Pulmonary/Chest: Effort normal and breath sounds normal.  Abdominal: There is tenderness.  Skin: She is not diaphoretic.  Psychiatric:  Visibly uncomfortable       Assessment & Plan:   Problem List Items Addressed This Visit    None    Visit Diagnoses    Epigastric pain    -  Primary   ddx includes MI, gastric ulcer, aortic dissection, etc., serious issues which require prompt evaluation; pt declined EMS transport, husband took her to ER   Chest pain, unspecified type       patient given 3 additional baby aspirin; declined EMS transport; husband drove her to ER       Follow up plan: No follow-ups on file.  An after-visit summary was printed and given to the patient at [place].  Please see the patient instructions which may contain other information and recommendations beyond what is mentioned above in the assessment and plan.  No orders of the defined types were placed in this encounter.   No orders of the defined types were placed in this encounter.

## 2017-09-01 NOTE — ED Provider Notes (Signed)
[facility] Emergency Department Provider Note  ____________________________________________  Time seen: Approximately 9:36 AM  I have reviewed the triage vital signs and the nursing notes.   HISTORY  Chief Complaint Chest Pain    HPI Amy [name] is a 82 y.o. female with a history of anxiety and GERD, peripheral artery disease, HTN, HL, DM, presenting with chest pain.  The patient reports that after dinner last night, she developed a central chest "burning" without any shortness of breath, palpitations, diaphoresis, nausea or vomiting, lightheadedness or syncope.  No burping or acid taste in her mouth.  This persisted approximately 1.5 hours until she went to sleep but she continued to have the symptoms this morning when she woke up.  She ate scrambled eggs, which did not change her symptoms.  She had ERCP with stent placement in the recent past but has not been having any right upper quadrant pain, fevers or chills.  She has a left AKA but has not been having any right lower extremity swelling or calf pain.  The patient does not know of anything that she can do to make her pain better or worse; it is not positional, it is not worse with exertion.  She has never undergone any risk stratification study including stress test or cardiac catheterization; she does not follow with a cardiologist for any reason.  He is feeling significantly anxious.  Past Medical History:  Diagnosis Date  . Anemia    low iron  . Anxiety   . Arthritis   . Bilateral cataracts   . Bronchitis    hx of   . Cancer of kidney, left ([place]) 2017  . Cataract   . Chronic kidney disease   . Depression   . Diabetes mellitus without complication ([place])   . Dry cough   . 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 GERD (gastroesophageal reflux disease)   . Headache   . History of frequent urinary tract infections   . Hyperlipidemia   . Hypertension    . Left renal mass   . Numbness and tingling    right hand   . Obesity   . Peripheral artery disease ([place])   . Urinary frequency   . Urinary incontinence   . Wheezing     Patient Active Problem List   Diagnosis Date Noted  . Choledocholithiasis 08/13/2017  . Chronic pain syndrome 04/05/2017  . Depression 11/15/2016  . Iron deficiency anemia 09/08/2016  . Weight loss 07/17/2016  . Anemia 06/29/2016  . Late onset Alzheimer's disease with behavioral disturbance 06/14/2016  . S/P AKA (above knee amputation) unilateral, left ([place]) 06/08/2016  . Wheelchair dependent 03/11/2016  . 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 ([place]) 03/13/2015  . Need for immunization against influenza 01/29/2015  . S/p nephrectomy 01/29/2015  . Renal neoplasm 01/15/2015  . Pre-operative cardiovascular examination 01/07/2015  . Abnormal EKG 01/02/2015  . Renal mass 12/05/2014  . GERD (gastroesophageal reflux disease) 11/20/2014  . GAD (generalized anxiety disorder) 11/20/2014  . AB (asthmatic bronchitis) 11/20/2014  . Aortic atherosclerosis ([place]) 11/20/2014  . Cataract 11/20/2014  . Leg pain 11/20/2014  . Continuous opioid dependence ([place]) 11/20/2014  . CKD (chronic kidney disease) stage 4, GFR 15-29 ml/min (HCC) 11/20/2014  . COPD, mild ([place]) 11/20/2014  . Encounter for general adult medical examination without abnormal findings 11/20/2014  . Excessive falling 11/20/2014  . Fatty infiltration of liver 11/20/2014  .  HTN (hypertension) 11/20/2014  . Hyperlipidemia 11/20/2014  . Anterior knee pain 11/20/2014  . LBP (low back pain) 11/20/2014  . Malaise and fatigue 11/20/2014  . OP (osteoporosis) 11/20/2014  . Parity 1 11/20/2014  . Abnormal presence of protein in urine 11/20/2014  . PVD (peripheral vascular disease) ([place]) 11/20/2014  . Allergic rhinitis 11/20/2014  . At risk for falling 11/20/2014  . UTI (urinary tract infection) 11/20/2014  . Phantom limb syndrome with pain ([place])  11/05/2014  . Anxiety 11/05/2014  . Elevated serum creatinine 11/05/2014  . Diabetes mellitus type 2, controlled ([place]) 11/05/2014  . H/O adenomatous polyp of colon 07/27/2010    Past Surgical History:  Procedure Laterality Date  . ABDOMINAL HYSTERECTOMY  1978  . CARPAL TUNNEL RELEASE Bilateral   . CHOLECYSTECTOMY    . COLONOSCOPY    . COLONOSCOPY WITH ESOPHAGOGASTRODUODENOSCOPY (EGD)    . ERCP N/A 08/19/2017   Procedure: ENDOSCOPIC RETROGRADE CHOLANGIOPANCREATOGRAPHY (ERCP);  Surgeon: Arta Silence, MD;  Location[facility] ENDOSCOPY;  Service: Endoscopy;  Laterality: N/A;  . LEG AMPUTATION     above the knee / left   . ROBOT ASSISTED LAPAROSCOPIC NEPHRECTOMY Left 01/15/2015   Procedure: ROBOTIC ASSISTED LAPAROSCOPIC RADICAL NEPHRECTOMY;  Surgeon: Alexis Frock, MD;  Location: [facility] ORS;  Service: Urology;  Laterality: Left;  . stent placement right leg       Current Outpatient Rx  . Order #: [id number] Class: Normal  . Order #: [id number] Class: Historical Med  . Order #: [id number] Class: Historical Med  . Order #: [id number] Class: Normal  . Order #: [id number] Class: Normal  . Order #: [id number] Class: Historical Med  . Order #: [id number] Class: Normal  . Order #: [id number] Class: Print  . Order #: [id number] Class: Normal  . Order #: [id number] Class: Normal  . Order #: [id number] Class: Historical Med  . Order #: [id number] Class: Normal  . Order #: [id number] Class: Normal  . Order #: [id number] Class: Historical Med  . Order #: [id number] Class: Print  . Order #: [id number] Class: Normal  . Order #: [id number] Class: Print    Allergies Fentanyl and Penicillins  Family History  Problem Relation Age of Onset  . Cancer Mother        Stomach  . Cancer Father   . Diabetes Brother   . Cancer Brother     Social History Social History   Tobacco Use  . Smoking status: Former Smoker    Packs/day: 1.50    Years: 51.00    Pack years: 76.50    Types: Cigarettes    Last attempt to quit: 05/24/2004     Years since quitting: 13.2  . Smokeless tobacco: Never Used  Substance Use Topics  . Alcohol use: Not Currently  . Drug use: No    Review of Systems Constitutional: No fever/chills.  No lightheadedness or syncope. Eyes: No visual changes. ENT: No sore throat. No congestion or rhinorrhea. Cardiovascular: Positive central chest pain. Denies palpitations. Respiratory: Denies shortness of breath.  No cough. Gastrointestinal: No abdominal pain.  No nausea, no vomiting.  No diarrhea.  No constipation.  Recent ERCP with stent placement. Genitourinary: Negative for dysuria. Musculoskeletal: Negative for back pain. Skin: Negative for rash. Neurological: Negative for headaches. No focal numbness, tingling or weakness.  Psych: Acute on chronic anxiety    ____________________________________________   PHYSICAL EXAM:  VITAL SIGNS: ED Triage Vitals  Enc Vitals Group     BP 09/01/17 0926 (!) 162/63     Pulse Rate 09/01/17 0926 (!) 57     Resp 09/01/17 0926 17  Temp 09/01/17 0926 98 F (36.7 C)     Temp Source 09/01/17 0926 Oral     SpO2 09/01/17 0926 97 %     Weight 09/01/17 0919 184 lb (83.5 kg)     Height 09/01/17 0919 5\' 2"  (1.575 m)     Head Circumference --      Peak Flow --      Pain Score 09/01/17 0919 9     Pain Loc --      Pain Edu? --      Excl. in [place]? --     Constitutional: Alert and oriented.  Anxious appearing but in no acute distress. Answers questions appropriately. Eyes: Conjunctivae are normal.  EOMI. No scleral icterus. Head: Atraumatic. Nose: No congestion/rhinnorhea. Mouth/Throat: Mucous membranes are moist.  Neck: No stridor.  Supple.  No JVD.  No meningismus. Cardiovascular: Normal rate, regular rhythm. No murmurs, rubs or gallops.  Respiratory: Normal respiratory effort.  No accessory muscle use or retractions. Lungs CTAB.  No wheezes, rales or ronchi. Gastrointestinal: Morbidly obese.  Soft, nontender and nondistended.  No guarding or rebound.  No  peritoneal signs. Musculoskeletal: Left AKA, no RLE edema. No ttp in the R calf or palpable cords.  Negative Homan's sign. Neurologic:  A&Ox3.  Speech is clear.  Face and smile are symmetric.  EOMI.  Moves all extremities well. Skin:  Skin is warm, dry and intact. No rash noted. Psychiatric: Anxious mood and affect.  ____________________________________________   LABS (all labs ordered are listed, but only abnormal results are displayed)  Labs Reviewed  CBC - Abnormal; Notable for the following components:      Result Value   WBC 12.2 (*)    RDW 15.8 (*)    All other components within normal limits  COMPREHENSIVE METABOLIC PANEL - Abnormal; Notable for the following components:   Glucose, Bld 132 (*)    Creatinine, Ser 1.33 (*)    Calcium 8.3 (*)    Total Protein 6.4 (*)    Albumin 3.1 (*)    ALT 13 (*)    Total Bilirubin 1.3 (*)    GFR calc non Af Amer 37 (*)    GFR calc Af Amer 43 (*)    All other components within normal limits  TROPONIN I  TROPONIN I   ____________________________________________  EKG  ED ECG REPORT I, Eula Listen, the attending physician, personally viewed and interpreted this ECG.   Date: 09/01/2017  EKG Time: 924  Rate: 59  Rhythm: sinus bradycardia  Axis: normal  Intervals:none  ST&T Change: No STEMI  ____________________________________________  RADIOLOGY  Dg Chest 2 View  Result Date: 09/01/2017 CLINICAL DATA:  Chest pain EXAM: CHEST - 2 VIEW COMPARISON:  08/08/2017 FINDINGS: Mild cardiomegaly. Bibasilar atelectasis or scarring. No effusions. Severe compression fracture in the lower thoracic spine, likely T12, stable since prior imaging. IMPRESSION: Mild cardiomegaly.  Bibasilar atelectasis or scarring. Electronically Signed   By: Rolm Baptise M.D.   On: 09/01/2017 10:36    ____________________________________________   PROCEDURES  Procedure(s) performed: None  Procedures  Critical Care performed:  No ____________________________________________   INITIAL IMPRESSION / ASSESSMENT AND PLAN / ED COURSE  Pertinent labs & imaging results that were available during my care of the patient were reviewed by me and considered in my medical decision making (see chart for details).  82 y.o. female with a history of anxiety and GERD, HTN, HL and DM without any known CAD, presenting with a central chest burning sensation.  The  patient is mildly hypertensive at 162/63, with a normal heart rate on my examination although bradycardic to 57 bpm on arrival in triage.  The patient's symptoms sound most consistent with reflux and I will treat her with a GI cocktail.  However, given her multiple cardiac risk factors, we will proceed with a cardiology evaluation, including EKG, chest x-ray and troponin x2.  PE would be very unlikely as the patient does not have a pleuritic pain, no hypoxia, no tachycardia, no evidence of DVT.  Aortic pathology is also much less likely.  Plan reevaluation for final disposition.  ED Course:  The patient's chest pain has completely resolved and her troponin has been negative twice.  At this time, plan to give the patient information about a bland diet for her symptoms, and to have counseled her to follow-up with her primary care physician as well as the cardiologist on call.  Return precautions were discussed with the patient and her husband. ____________________________________________  FINAL CLINICAL IMPRESSION(S) / ED DIAGNOSES  Final diagnoses:  Chest pain, unspecified type  Anxiety    Clinical Course as of Sep 01 1428  Thu Sep 01, 2017  1111 Patient is resting comfortably and continues to be hemodynamically stable.  She states her pain has significantly improved.  We are awaiting a second troponin, and plan reevaluation for final disposition.   [AN]    Clinical Course User Index [AN] Eula Listen, MD      NEW MEDICATIONS STARTED DURING THIS  VISIT:  Discharge Medication List as of 09/01/2017  1:40 PM        Eula Listen, MD 09/01/17 1431

## 2017-09-02 ENCOUNTER — Telehealth: Payer: Self-pay | Admitting: Gastroenterology

## 2017-09-02 DIAGNOSIS — R0602 Shortness of breath: Secondary | ICD-10-CM | POA: Diagnosis not present

## 2017-09-02 DIAGNOSIS — G301 Alzheimer's disease with late onset: Secondary | ICD-10-CM | POA: Diagnosis not present

## 2017-09-02 DIAGNOSIS — I208 Other forms of angina pectoris: Secondary | ICD-10-CM | POA: Diagnosis not present

## 2017-09-02 DIAGNOSIS — F0281 Dementia in other diseases classified elsewhere with behavioral disturbance: Secondary | ICD-10-CM | POA: Diagnosis not present

## 2017-09-02 DIAGNOSIS — J449 Chronic obstructive pulmonary disease, unspecified: Secondary | ICD-10-CM | POA: Diagnosis not present

## 2017-09-02 DIAGNOSIS — R011 Cardiac murmur, unspecified: Secondary | ICD-10-CM | POA: Diagnosis not present

## 2017-09-02 DIAGNOSIS — G546 Phantom limb syndrome with pain: Secondary | ICD-10-CM | POA: Diagnosis not present

## 2017-09-02 DIAGNOSIS — F411 Generalized anxiety disorder: Secondary | ICD-10-CM | POA: Diagnosis not present

## 2017-09-02 NOTE — Telephone Encounter (Signed)
Talked with husband and I contacted wife of which has dementia at times but on 4/4 she seemed very clear. He is aware he can just take her to hospital and have this done.

## 2017-09-02 NOTE — Telephone Encounter (Signed)
Patients husband called and stated that another doctor mentioned an xray regarding the stint she has. He would like for you to call him later today because they have another appt this afternoon. He is concerned about this stint. Please call today.

## 2017-09-02 NOTE — Telephone Encounter (Signed)
Okay for 4:00 pm Monday April 15th

## 2017-09-02 NOTE — Telephone Encounter (Signed)
LVM for pt to come in at 4 on 09/05/17

## 2017-09-04 NOTE — Progress Notes (Signed)
[facility] Consultation      Assessment and Plan:  Chronic bronchitis with dyspnea on exertion. -Dyspnea on exertion is likely multifactorial from underlying respiratory disease, as well as deconditioning, obesity.  Patient is wheelchair-bound. - Patient is noted to have excess secretions, and sounds "junky" in her lungs.  This puts her at increased risk of developing a pneumonia. -Discussed with husband goal will be to try to mobilize her secretions and provide some relief of her dyspnea with both bronchodilators and a flutter valve.  She is recommended to use each 3 times daily, as well as Mucinex once daily.  If this becomes problematic and causes recurrent infections could consider a percussion vest to help better mobilize the secretions.  The patient has recently had a CT scan which was reviewed below, if symptoms persist at next visit will consider chest x-ray.  Dementia. -Patient has significant difficulty understanding instructions, and providing history.  Therefore history is obtained from the chart, and from staff, instructions were given to the patient's husband who not acknowledged understanding.  Lung nodule. -There is slow/minimal growth of a right pleural-based nodule over the last 1 year, currently 7 mm in size. -Patient is currently being followed at the cancer center and [facility], can continue surveillance scanning every 6-12 months.  Orders Placed This Encounter  Procedures  . Ambulatory Referral for DME   Meds ordered this encounter  Medications  . DISCONTD: ipratropium-albuterol (DUONEB) 0.5-2.5 (3) MG/3ML SOLN    Sig: Take 3 mLs by nebulization 3 (three) times daily.    Dispense:  360 mL    Refill:  2  . AMBULATORY NON FORMULARY MEDICATION    Sig: Medication Name: Flutter valve use after nebulizer treatment.    Dispense:  1 each    Refill:  0  . ipratropium-albuterol (DUONEB) 0.5-2.5 (3) MG/3ML SOLN    Sig: Take 3 mLs by nebulization 3 (three)  times daily. J44.9    Dispense:  360 mL    Refill:  0   Return in about 6 weeks (around 10/17/2017).    Date: 09/05/2017  MRN# [id number] Amy [name] 09/01/1935    Amy [name] is a 82 y.o. old female seen in consultation for chief complaint of:    Chief Complaint  Patient presents with  . Consult    Referred by E. Poulose for eval COPD  . Cough  . Shortness of Breath    HPI:  The patient is a 82 yo female, she has dementia so history is provided by husband. She has been having trouble breathing with moderate dyspnea. She is wheel chair bound and requires assistance for transfers. When she transfers from bed-commode and back she will be winded for a few minutes.  She quit smoking aboout 13 years ago, she smoked over a ppd back then.  Husband notes that trouble breathing has been going on for more than a year.  She has a left AKA due to vascular disease.   She has a lung nodule, she is followed for 6 month CT scans at cancer center in [place].   They have an albuterol inhaler currently but she is not using it correctly so stopped using it.   Images personally reviewed, CT chest 08/12/17 in comparison with previous 02/16/17 there is a tiny right pleural  based anterior nodule increased from 6.6 mm to 7.2 mm, though appears grossly unchanged. Previous CT 08/16/16 showed 6 mm nodule.    PMHX:   Past Medical History:  Diagnosis Date  . Anemia    low iron  . Anxiety   . Arthritis   . Bilateral cataracts   . Bronchitis    hx of   . Cancer of kidney, left ([place]) 2017  . Cataract   . Chronic kidney disease   . Depression   . Diabetes mellitus without complication ([place])   . Dry cough   . 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 GERD (gastroesophageal reflux disease)   . Headache   . History of frequent urinary tract infections   . Hyperlipidemia   . Hypertension   . Left renal mass   . Numbness and tingling    right  hand   . Obesity   . Peripheral artery disease ([place])   . Urinary frequency   . Urinary incontinence   . Wheezing    Surgical Hx:  Past Surgical History:  Procedure Laterality Date  . ABDOMINAL HYSTERECTOMY  1978  . CARPAL TUNNEL RELEASE Bilateral   . CHOLECYSTECTOMY    . COLONOSCOPY    . COLONOSCOPY WITH ESOPHAGOGASTRODUODENOSCOPY (EGD)    . ERCP N/A 08/19/2017   Procedure: ENDOSCOPIC RETROGRADE CHOLANGIOPANCREATOGRAPHY (ERCP);  Surgeon: Arta Silence, MD;  Location[facility] ENDOSCOPY;  Service: Endoscopy;  Laterality: N/A;  . LEG AMPUTATION     above the knee / left   . ROBOT ASSISTED LAPAROSCOPIC NEPHRECTOMY Left 01/15/2015   Procedure: ROBOTIC ASSISTED LAPAROSCOPIC RADICAL NEPHRECTOMY;  Surgeon: Alexis Frock, MD;  Location: [facility] ORS;  Service: Urology;  Laterality: Left;  . stent placement right leg      Family Hx:  Family History  Problem Relation Age of Onset  . Cancer Mother        Stomach  . Cancer Father   . Diabetes Brother   . Cancer Brother    Social Hx:   Social History   Tobacco Use  . Smoking status: Former Smoker    Packs/day: 1.50    Years: 51.00    Pack years: 76.50    Types: Cigarettes    Last attempt to quit: 05/24/2004    Years since quitting: 13.2  . Smokeless tobacco: Never Used  Substance Use Topics  . Alcohol use: Not Currently  . Drug use: No   Medication:    Current Outpatient Medications:  .  acetaminophen (TYLENOL) 500 MG tablet, Take 1,000 mg by mouth every 6 (six) hours as needed for mild pain, fever or headache. , Disp: , Rfl:  .  albuterol (PROVENTIL HFA;VENTOLIN HFA) 108 (90 Base) MCG/ACT inhaler, Inhale 2 puffs into the lungs every 6 (six) hours as needed for wheezing or shortness of breath., Disp: 1 Inhaler, Rfl: 2 .  atorvastatin (LIPITOR) 20 MG tablet, Take 1 tablet (20 mg total) by mouth at bedtime., Disp: 90 tablet, Rfl: 0 .  busPIRone (BUSPAR) 10 MG tablet, Take 10 mg by mouth 3 (three) times daily., Disp: , Rfl:  .  calcitRIOL  (ROCALTROL) 0.25 MCG capsule, Take 0.25 mcg by mouth 3 (three) times a week., Disp: , Rfl:  .  citalopram (CELEXA) 20 MG tablet, Take 2 tablets (40 mg total) by mouth at bedtime., Disp: 90 tablet, Rfl: 0 .  clopidogrel (PLAVIX) 75 MG tablet, Take 1 tablet (75 mg total) by mouth daily., Disp: 90 tablet, Rfl: 3 .  donepezil (ARICEPT) 10 MG tablet, Take 1 tablet (10 mg total) by mouth at bedtime., Disp: 30 tablet, Rfl: 3 .  gabapentin (NEURONTIN) 300 MG capsule, Take 1 capsule (300 mg total) by mouth 3 (three) times daily., Disp: 270 capsule, Rfl: 3 .  glucose blood test strip, Use as instructed., Disp: 100 each, Rfl: 12 .  HYDROcodone-acetaminophen (NORCO) 7.5-325 MG tablet, Take 1 tablet by mouth 3 (three) times daily as needed., Disp: 90 tablet, Rfl: 0 .  metoprolol tartrate (LOPRESSOR) 50 MG tablet, Take 1 tablet (50 mg total) by mouth 2 (two) times daily., Disp: 180 tablet, Rfl: 0 .  pantoprazole (PROTONIX) 40 MG tablet, Take 1 tablet (40 mg total) by mouth daily., Disp: 90 tablet, Rfl: 1 .  ranitidine (ZANTAC) 150 MG capsule, Take 1 capsule (150 mg total) by mouth at bedtime., Disp: 90 capsule, Rfl: 3   Allergies:  Fentanyl and Penicillins  Review of Systems: Gen:  Denies  fever, sweats, chills HEENT: Denies blurred vision, double vision. bleeds, sore throat Cvc:  No dizziness, chest pain. Resp:   Denies cough or sputum production, shortness of breath Gi: Denies swallowing difficulty, stomach pain. Gu:  Denies bladder incontinence, burning urine Ext:   No Joint pain, stiffness. Skin: No skin rash,  hives  Endoc:  No polyuria, polydipsia. Psych: No depression, insomnia. Other:  All other systems were reviewed with the patient and were negative other that what is mentioned in the HPI.   Physical Examination:   VS: BP 130/78 (BP Location: Left Arm, Cuff Size: Large)   Pulse (!) 55   Resp 16   Ht 5\' 2"  (1.575 m)   SpO2 95%   BMI 33.65 kg/m   General Appearance: No distress    Neuro:without focal findings,  speech normal,  HEENT: PERRLA, EOM intact.   Pulmonary: normal breath sounds, No wheezing.  CardiovascularNormal S1,S2.  No m/r/g.   Abdomen: Benign, Soft, non-tender. Renal:  No costovertebral tenderness  GU:  No performed at this time. Endoc: No evident thyromegaly, no signs of acromegaly. Skin:   warm, no rashes, no ecchymosis  Extremities: normal, no cyanosis, clubbing.  Other findings:    LABORATORY PANEL:   CBC Recent Labs  Lab 09/01/17 0934  WBC 12.2*  HGB 12.3  HCT 36.9  PLT 222   ------------------------------------------------------------------------------------------------------------------  Chemistries  Recent Labs  Lab 09/01/17 0934  NA 137  K 3.5  CL 104  CO2 26  GLUCOSE 132*  BUN 13  CREATININE 1.33*  CALCIUM 8.3*  AST 24  ALT 13*  ALKPHOS 115  BILITOT 1.3*   ------------------------------------------------------------------------------------------------------------------  Cardiac Enzymes Recent Labs  Lab 09/01/17 1246  TROPONINI <0.03   ------------------------------------------------------------  RADIOLOGY:  No results found.     Thank  you for the consultation and for allowing [facility] to assist in the care of your patient. Our recommendations are noted above.  Please contact us if we can be of further service.   Marda Stalker, MD.  Board Certified in Internal Medicine, Pulmonary Medicine, [place], and Sleep Medicine.  [facility] Office Number: [phone]  Patricia Pesa, M.D.  Merton Border, M.D  09/05/2017

## 2017-09-05 ENCOUNTER — Encounter: Payer: Self-pay | Admitting: Internal Medicine

## 2017-09-05 ENCOUNTER — Ambulatory Visit
Admission: RE | Admit: 2017-09-05 | Discharge: 2017-09-05 | Disposition: A | Discharge status: Home / Self Care | Payer: Medicare HMO | Source: Ambulatory Visit | Attending: Gastroenterology | Admitting: Gastroenterology

## 2017-09-05 ENCOUNTER — Ambulatory Visit (INDEPENDENT_AMBULATORY_CARE_PROVIDER_SITE_OTHER): Payer: Medicare HMO | Admitting: Family Medicine

## 2017-09-05 ENCOUNTER — Encounter: Payer: Self-pay | Admitting: Family Medicine

## 2017-09-05 ENCOUNTER — Ambulatory Visit: Payer: Medicare HMO | Admitting: Internal Medicine

## 2017-09-05 ENCOUNTER — Ambulatory Visit: Payer: Self-pay | Admitting: *Deleted

## 2017-09-05 VITALS — BP 122/80 | HR 69 | Temp 98.3°F | Ht 62.0 in | Wt 184.0 lb

## 2017-09-05 VITALS — BP 130/78 | HR 55 | Resp 16 | Ht 62.0 in

## 2017-09-05 DIAGNOSIS — E118 Type 2 diabetes mellitus with unspecified complications: Secondary | ICD-10-CM | POA: Diagnosis not present

## 2017-09-05 DIAGNOSIS — R197 Diarrhea, unspecified: Secondary | ICD-10-CM

## 2017-09-05 DIAGNOSIS — I700 Atherosclerosis of aorta: Secondary | ICD-10-CM | POA: Insufficient documentation

## 2017-09-05 DIAGNOSIS — J42 Unspecified chronic bronchitis: Secondary | ICD-10-CM | POA: Diagnosis not present

## 2017-09-05 DIAGNOSIS — R112 Nausea with vomiting, unspecified: Secondary | ICD-10-CM | POA: Diagnosis not present

## 2017-09-05 DIAGNOSIS — N184 Chronic kidney disease, stage 4 (severe): Secondary | ICD-10-CM | POA: Diagnosis not present

## 2017-09-05 DIAGNOSIS — R109 Unspecified abdominal pain: Secondary | ICD-10-CM | POA: Diagnosis not present

## 2017-09-05 DIAGNOSIS — M810 Age-related osteoporosis without current pathological fracture: Secondary | ICD-10-CM | POA: Diagnosis not present

## 2017-09-05 DIAGNOSIS — E781 Pure hyperglyceridemia: Secondary | ICD-10-CM

## 2017-09-05 MED ORDER — IPRATROPIUM-ALBUTEROL 0.5-2.5 (3) MG/3ML IN SOLN: 3.0000 mL | Freq: Three times a day (TID) | RESPIRATORY_TRACT | 2 refills | Status: DC

## 2017-09-05 MED ORDER — CHOLESTYRAMINE 4 G PO PACK: 4.0000 g | PACK | Freq: Two times a day (BID) | ORAL | 11 refills | Status: DC

## 2017-09-05 MED ORDER — IPRATROPIUM-ALBUTEROL 0.5-2.5 (3) MG/3ML IN SOLN: 3.0000 mL | Freq: Three times a day (TID) | RESPIRATORY_TRACT | 0 refills | Status: DC

## 2017-09-05 MED ORDER — AMBULATORY NON FORMULARY MEDICATION: 0 refills | Status: DC

## 2017-09-05 NOTE — Progress Notes (Signed)
BP 122/80 (BP Location: Right Arm, Patient Position: Sitting, Cuff Size: Large)   Pulse 69   Temp 98.3 F (36.8 C) (Oral)   Ht 5\' 2"  (1.575 m)   Wt 184 lb (83.5 kg)   SpO2 95%   BMI 33.65 kg/m    Subjective:    Patient ID: Amy [name], female    DOB: 1935-12-09, 82 y.o.   MRN: [id number]  HPI: Amy [name] is a 82 y.o. female  Chief Complaint  Patient presents with  . Follow-up  . Diarrhea    x 6 months, husband has to clean her up multiple times per day, pt is unaware of the urge to have BM   . Medication Refill    atorvastatin, celexa, aricept 90 day supply please     HPI  She is here today Sent to the ER for chest pain and epigastric pain; still having the chest pain; ER doctor said it is acid reflux She is going back to see Dr. Clayborn Bigness, cardiologist; she is getting a nuclear stress and echo next week Not eating many foods; couldn't eat much today I asked about NSAIDs; she thinks she takes tylenol, no ibuprofen Taking pantoprazole, no missed doses  Last labs reviewed One kidney, Cr much improved Kidney doctor is Dr. Juleen China Vascular doctor is Dr. Lucky Cowboy, every six months; circulation in the right leg is staying about the stay, hanging in there; not getting any worse Seeing Dr. Manuella Ghazi, Rufina Falco Dr. Ashby Dawes, pulmonologist, gave her a prescription for nebulizer and flutter valve  Obesity; would eat at [employer] every week and on Thursdays, would get a pizza; then burger occasionally; lots hot dogs  Diarrhea; 4 times a day  Diabetes Lab Results  Component Value Date   HGBA1C 5.9 06/15/2017   Phantom pain, left leg; amputation was 2007  Depression screen [facility] Ed 2/9 09/15/2017 09/01/2017 08/31/2017 08/25/2017 08/11/2017  Decreased Interest 0 0 - 1 0  Down, Depressed, Hopeless 0 2 1 3  0  PHQ - 2 Score 0 2 1 4  0  Altered sleeping - - 1 1 -  Tired, decreased energy - - 2 2 -  Change in appetite - - 0 0 -  Feeling bad or failure about yourself  - -  3 3 -  Trouble concentrating - - 1 3 -  Moving slowly or fidgety/restless - - 0 0 -  Suicidal thoughts - - 0 0 -  PHQ-9 Score - - 8 13 -  Difficult doing work/chores - - Somewhat difficult Somewhat difficult -  Some recent data might be hidden   Relevant past medical, surgical, family and social history reviewed Past Medical History:  Diagnosis Date  . Anemia    low iron  . Anxiety   . Arthritis   . Bilateral cataracts   . Bronchitis    hx of   . Cancer of kidney, left ([place]) 2017  . Cataract   . Chronic kidney disease   . Depression   . Diabetes mellitus without complication ([place])   . Dry cough   . 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 GERD (gastroesophageal reflux disease)   . Headache   . History of frequent urinary tract infections   . Hyperlipidemia   . Hypertension   . Left renal mass   . Numbness and tingling    right hand   . Obesity   . Peripheral artery disease (  HCC)   . Urinary frequency   . Urinary incontinence   . Wheezing    Past Surgical History:  Procedure Laterality Date  . ABDOMINAL HYSTERECTOMY  1978  . CARPAL TUNNEL RELEASE Bilateral   . CHOLECYSTECTOMY    . COLONOSCOPY    . COLONOSCOPY WITH ESOPHAGOGASTRODUODENOSCOPY (EGD)    . ERCP N/A 08/19/2017   Procedure: ENDOSCOPIC RETROGRADE CHOLANGIOPANCREATOGRAPHY (ERCP);  Surgeon: Arta Silence, MD;  Location[facility] ENDOSCOPY;  Service: Endoscopy;  Laterality: N/A;  . LEG AMPUTATION     above the knee / left   . ROBOT ASSISTED LAPAROSCOPIC NEPHRECTOMY Left 01/15/2015   Procedure: ROBOTIC ASSISTED LAPAROSCOPIC RADICAL NEPHRECTOMY;  Surgeon: Alexis Frock, MD;  Location: [facility] ORS;  Service: Urology;  Laterality: Left;  . stent placement right leg      Family History  Problem Relation Age of Onset  . Cancer Mother        Stomach  . Cancer Father   . Diabetes Brother   . Cancer Brother    Social History   Tobacco Use  . Smoking status: Former Smoker     Packs/day: 1.50    Years: 51.00    Pack years: 76.50    Types: Cigarettes    Last attempt to quit: 05/24/2004    Years since quitting: 13.3  . Smokeless tobacco: Never Used  Substance Use Topics  . Alcohol use: Not Currently  . Drug use: No    Interim medical history since last visit reviewed. Allergies and medications reviewed  Review of Systems Per HPI unless specifically indicated above     Objective:    BP 122/80 (BP Location: Right Arm, Patient Position: Sitting, Cuff Size: Large)   Pulse 69   Temp 98.3 F (36.8 C) (Oral)   Ht 5\' 2"  (1.575 m)   Wt 184 lb (83.5 kg)   SpO2 95%   BMI 33.65 kg/m   Wt Readings from Last 3 Encounters:  09/15/17 184 lb (83.5 kg)  09/05/17 184 lb (83.5 kg)  09/01/17 184 lb (83.5 kg)    Physical Exam  Constitutional: She appears well-developed and well-nourished. No distress.  HENT:  Head: Normocephalic and atraumatic.  Eyes: EOM are normal. No scleral icterus.  Neck: No thyromegaly present.  Cardiovascular: Normal rate, regular rhythm and normal heart sounds.  No murmur heard. Pulmonary/Chest: Effort normal and breath sounds normal. No respiratory distress. She has no wheezes.  Abdominal: Soft. Bowel sounds are normal. She exhibits no distension.  Musculoskeletal: Normal range of motion. She exhibits no edema.  Neurological: She is alert. She exhibits normal muscle tone.  Skin: Skin is warm and dry. She is not diaphoretic. No pallor.  Psychiatric: She has a normal mood and affect. Her behavior is normal. Judgment and thought content normal.   Diabetic Foot Form - Detailed   Diabetic Foot Exam - detailed Diabetic Foot exam was performed with the following findings:  Yes 09/05/2017  7:34 AM  Visual Foot Exam completed.:  Yes  Pulse Foot Exam completed.:  Yes  Right Dorsalis Pedis:  Present   Sensory Foot Exam Completed.:  Yes Semmes-Weinstein Monofilament Test R Site 1-Great Toe:  Pos     Comments:  Status post LEFT AKA          Assessment & Plan:   Problem List Items Addressed This Visit      Endocrine   Diabetes mellitus type 2, controlled ([place])    Well-controlled now; drinking sugary drinks; stopping that will help will help; foot  exam by MD      Relevant Orders   Urine Microalbumin w/creat. ratio   Ambulatory referral to Ophthalmology     Musculoskeletal and Integument   OP (osteoporosis)    Order DEXA scan; encouraged calcium with diet, vit D; fall precautions      Relevant Orders   DG Bone Density     Genitourinary   CKD (chronic kidney disease) stage 4, GFR 15-29 ml/min ([place])    Managed by nephrologist, Dr. Juleen China; avoid NSAIDs        Other   Hyperlipidemia    Limit fatty meats      Relevant Medications   furosemide (LASIX) 20 MG tablet   cholestyramine (QUESTRAN) 4 g packet    Other Visit Diagnoses    Diarrhea, unspecified type    -  Primary   start cholestyramine       Follow up plan: Return in about 3 months (around 12/13/2017) for follow-up visit with Dr. Sanda Klein; cancel appt 09/13/17.  An after-visit summary was printed and given to the patient at [place].  Please see the patient instructions which may contain other information and recommendations beyond what is mentioned above in the assessment and plan.  Meds ordered this encounter  Medications  . cholestyramine (QUESTRAN) 4 g packet    Sig: Take 1 packet (4 g total) by mouth 2 (two) times daily with a meal.    Dispense:  60 each    Refill:  11    Orders Placed This Encounter  Procedures  . DG Bone Density  . Urine Microalbumin w/creat. ratio  . Ambulatory referral to Ophthalmology

## 2017-09-05 NOTE — Assessment & Plan Note (Signed)
Limit fatty meats

## 2017-09-05 NOTE — Assessment & Plan Note (Signed)
Managed by nephrologist, Dr. Juleen China; avoid NSAIDs

## 2017-09-05 NOTE — Assessment & Plan Note (Signed)
Order DEXA scan; encouraged calcium with diet, vit D; fall precautions

## 2017-09-05 NOTE — Patient Instructions (Addendum)
  Caution: prolonged use of proton pump inhibitors like omeprazole (Prilosec), pantoprazole (Protonix), esomeprazole (Nexium), and others like Dexilant and Aciphex may increase your risk of pneumonia, Clostridium difficile colitis, osteoporosis, anemia and other health complications Try to limit or avoid triggers like coffee, caffeinated beverages, onions, chocolate, spicy foods, peppermint, acidic foods like pizza, spaghetti sauce, and orange juice Lose weight if you are overweight or obese Try elevating the head of your bed by placing a small wedge between your mattress and box springs to keep acid in the stomach at night instead of coming up into your esophagus Try to increase your intake of vegetables and fruits, non-starchy veggies are best  Check out the information at familydoctor.org entitled "Nutrition for Weight Loss: What You Need to Know about Fad Diets" Try to lose between 1-2 pounds per week by taking in fewer calories and burning off more calories You can succeed by limiting portions, limiting foods dense in calories and fat, becoming more active, and drinking 8 glasses of water a day (64 ounces) Don't skip meals, especially breakfast, as skipping meals may alter your metabolism Do not use over-the-counter weight loss pills or gimmicks that claim rapid weight loss A healthy BMI (or body mass index) is between 18.5 and 24.9 You can calculate your ideal BMI at the [place] website ClubMonetize.fr  Please do call to schedule your bone density study; the number to schedule one at either [facility] or [facility] is [phone] or [phone]  Start the cholestyramine, twice a day with meals, and we'll see if that helps with the diarrhea

## 2017-09-05 NOTE — Patient Instructions (Addendum)
Start mucinex tablets once daily.  Start flutter valve followed by nebuliser three times per day.

## 2017-09-05 NOTE — Assessment & Plan Note (Signed)
Well-controlled now; drinking sugary drinks; stopping that will help will help; foot exam by MD

## 2017-09-06 ENCOUNTER — Other Ambulatory Visit: Payer: Self-pay | Admitting: *Deleted

## 2017-09-06 NOTE — Patient Outreach (Signed)
[facility] [facility]) Care Management  09/06/2017  Amy [name] Oct 02, 1935 [id number]   Phone call from Hassell Done from [facility] stating that patient's spouse has declined in home care at this time stating that he is not sure when he would use the service due to multiple medical appointments. He further stated that at this time he is able to provide patient with all of her care needs at this time.   Sheralyn Boatman [facility] Care Management [phone]

## 2017-09-06 NOTE — Patient Outreach (Signed)
[facility] [place] [facility]) Care Management  09/06/2017  Amy [name] Jul 25, 1935 [id number]   Phone call to patient's spouse to follow up on referral for Homecare Providers. Per patient's spouse, he spoke with the intake coordinator for [employer] and he has decided to decline the in home assistance at this time. Per patient's spouse, patient has so many appointments that he does not know when he would use the help. He further stated that he is able to handle all of her care needs at this time. This Education officer, museum explored spouses plan of care if patient's condition worsens. Per spouse, he is able to take care of her care needs at this time and reiterated that he is not in need of in home care. Patient's spouse provided with this social worker's contact information for any additional questions regarding patient's long term plan or community resource needs. Patient to be closed to social work at this time.    Sheralyn Boatman [facility] Care Management [phone]

## 2017-09-07 ENCOUNTER — Other Ambulatory Visit: Payer: Self-pay | Admitting: *Deleted

## 2017-09-07 DIAGNOSIS — Z89612 Acquired absence of left leg above knee: Secondary | ICD-10-CM | POA: Diagnosis not present

## 2017-09-07 DIAGNOSIS — J42 Unspecified chronic bronchitis: Secondary | ICD-10-CM | POA: Diagnosis not present

## 2017-09-07 DIAGNOSIS — S31125A Laceration of abdominal wall with foreign body, periumbilic region without penetration into peritoneal cavity, initial encounter: Secondary | ICD-10-CM | POA: Diagnosis not present

## 2017-09-07 NOTE — Patient Outreach (Signed)
[place] [facility]) Care Management  09/07/2017  KATIRIA CALAME 1935/12/26 [id number]   Follow telephone outreach call  Chart reviewed :  Patient history includes : Diabetes, COPD,Alzheimer's,chronicpain syndrome, s/p left AKA,excessive falling , hx renal cancer Recent hospital admission at [facility] 3/22- 3/25 Dx: RUQ pain , Choledocholithiasis, UTI  ED visit on 4/11 for chest pain.   Successful outreach call to patient home, able to speak with spouse, Davey Bergsma, primary contact, HIPAA verified.   Patient spouse discussed recent ED visit of chest pain reports patient has not had reoccurence of chest pain. He discussed patent appointment this week to cardiology office and plans in next week for nuclear cardiac stress test.  Spouse discussed patient visit with Pulmonary MD and new orders for nebulizer treatments, he reports he has picked up the prescription for albuterol and has received call from [facility] and will pick up nebulizer on today.  Spouse discussed visit with PCP on this week and new order for medication to help with diarrhea, he reports it is working patient has had more formed bowel movement and that has been easier for him to manage.  Mr.Andreas reports he is managing well at home with patient care at home at this time and does not feels he needs additional assistance.    Plan Will plan follow up visit in the next month.    Joylene Draft, RN, [place] Management Coordinator  [phone]- Mobile [phone]- Toll Free Main Office

## 2017-09-12 DIAGNOSIS — I208 Other forms of angina pectoris: Secondary | ICD-10-CM | POA: Diagnosis not present

## 2017-09-12 DIAGNOSIS — R0602 Shortness of breath: Secondary | ICD-10-CM | POA: Diagnosis not present

## 2017-09-13 ENCOUNTER — Ambulatory Visit: Payer: Medicare HMO | Admitting: Family Medicine

## 2017-09-15 ENCOUNTER — Ambulatory Visit
Payer: Medicare HMO | Attending: Student in an Organized Health Care Education/Training Program | Admitting: Student in an Organized Health Care Education/Training Program

## 2017-09-15 ENCOUNTER — Telehealth: Payer: Self-pay | Admitting: Gastroenterology

## 2017-09-15 ENCOUNTER — Other Ambulatory Visit: Payer: Self-pay

## 2017-09-15 ENCOUNTER — Encounter: Payer: Self-pay | Admitting: Student in an Organized Health Care Education/Training Program

## 2017-09-15 VITALS — BP 119/79 | HR 51 | Temp 98.2°F | Resp 18 | Ht 62.0 in | Wt 184.0 lb

## 2017-09-15 DIAGNOSIS — Z89612 Acquired absence of left leg above knee: Secondary | ICD-10-CM

## 2017-09-15 DIAGNOSIS — Z90710 Acquired absence of both cervix and uterus: Secondary | ICD-10-CM | POA: Insufficient documentation

## 2017-09-15 DIAGNOSIS — F329 Major depressive disorder, single episode, unspecified: Secondary | ICD-10-CM | POA: Diagnosis not present

## 2017-09-15 DIAGNOSIS — F411 Generalized anxiety disorder: Secondary | ICD-10-CM | POA: Diagnosis not present

## 2017-09-15 DIAGNOSIS — E1122 Type 2 diabetes mellitus with diabetic chronic kidney disease: Secondary | ICD-10-CM | POA: Insufficient documentation

## 2017-09-15 DIAGNOSIS — I700 Atherosclerosis of aorta: Secondary | ICD-10-CM | POA: Insufficient documentation

## 2017-09-15 DIAGNOSIS — M25561 Pain in right knee: Secondary | ICD-10-CM | POA: Insufficient documentation

## 2017-09-15 DIAGNOSIS — Z87891 Personal history of nicotine dependence: Secondary | ICD-10-CM | POA: Insufficient documentation

## 2017-09-15 DIAGNOSIS — Z885 Allergy status to narcotic agent status: Secondary | ICD-10-CM | POA: Insufficient documentation

## 2017-09-15 DIAGNOSIS — M25552 Pain in left hip: Secondary | ICD-10-CM | POA: Diagnosis not present

## 2017-09-15 DIAGNOSIS — E785 Hyperlipidemia, unspecified: Secondary | ICD-10-CM | POA: Diagnosis not present

## 2017-09-15 DIAGNOSIS — Z833 Family history of diabetes mellitus: Secondary | ICD-10-CM | POA: Insufficient documentation

## 2017-09-15 DIAGNOSIS — Z9889 Other specified postprocedural states: Secondary | ICD-10-CM | POA: Insufficient documentation

## 2017-09-15 DIAGNOSIS — Z760 Encounter for issue of repeat prescription: Secondary | ICD-10-CM | POA: Diagnosis not present

## 2017-09-15 DIAGNOSIS — G894 Chronic pain syndrome: Secondary | ICD-10-CM | POA: Insufficient documentation

## 2017-09-15 DIAGNOSIS — Z23 Encounter for immunization: Secondary | ICD-10-CM | POA: Insufficient documentation

## 2017-09-15 DIAGNOSIS — Z9049 Acquired absence of other specified parts of digestive tract: Secondary | ICD-10-CM | POA: Insufficient documentation

## 2017-09-15 DIAGNOSIS — E669 Obesity, unspecified: Secondary | ICD-10-CM | POA: Insufficient documentation

## 2017-09-15 DIAGNOSIS — E1151 Type 2 diabetes mellitus with diabetic peripheral angiopathy without gangrene: Secondary | ICD-10-CM | POA: Insufficient documentation

## 2017-09-15 DIAGNOSIS — G301 Alzheimer's disease with late onset: Secondary | ICD-10-CM | POA: Diagnosis not present

## 2017-09-15 DIAGNOSIS — M25551 Pain in right hip: Secondary | ICD-10-CM | POA: Diagnosis not present

## 2017-09-15 DIAGNOSIS — K219 Gastro-esophageal reflux disease without esophagitis: Secondary | ICD-10-CM | POA: Insufficient documentation

## 2017-09-15 DIAGNOSIS — D509 Iron deficiency anemia, unspecified: Secondary | ICD-10-CM | POA: Diagnosis not present

## 2017-09-15 DIAGNOSIS — F0281 Dementia in other diseases classified elsewhere with behavioral disturbance: Secondary | ICD-10-CM | POA: Diagnosis not present

## 2017-09-15 DIAGNOSIS — Z7902 Long term (current) use of antithrombotics/antiplatelets: Secondary | ICD-10-CM | POA: Diagnosis not present

## 2017-09-15 DIAGNOSIS — J449 Chronic obstructive pulmonary disease, unspecified: Secondary | ICD-10-CM | POA: Diagnosis not present

## 2017-09-15 DIAGNOSIS — Z880 Allergy status to penicillin: Secondary | ICD-10-CM | POA: Insufficient documentation

## 2017-09-15 DIAGNOSIS — Z87440 Personal history of urinary (tract) infections: Secondary | ICD-10-CM | POA: Insufficient documentation

## 2017-09-15 DIAGNOSIS — Z905 Acquired absence of kidney: Secondary | ICD-10-CM | POA: Insufficient documentation

## 2017-09-15 DIAGNOSIS — E1136 Type 2 diabetes mellitus with diabetic cataract: Secondary | ICD-10-CM | POA: Diagnosis not present

## 2017-09-15 DIAGNOSIS — Z79899 Other long term (current) drug therapy: Secondary | ICD-10-CM | POA: Diagnosis not present

## 2017-09-15 DIAGNOSIS — I129 Hypertensive chronic kidney disease with stage 1 through stage 4 chronic kidney disease, or unspecified chronic kidney disease: Secondary | ICD-10-CM | POA: Insufficient documentation

## 2017-09-15 DIAGNOSIS — N184 Chronic kidney disease, stage 4 (severe): Secondary | ICD-10-CM | POA: Insufficient documentation

## 2017-09-15 DIAGNOSIS — R32 Unspecified urinary incontinence: Secondary | ICD-10-CM | POA: Insufficient documentation

## 2017-09-15 DIAGNOSIS — R200 Anesthesia of skin: Secondary | ICD-10-CM | POA: Insufficient documentation

## 2017-09-15 DIAGNOSIS — Z809 Family history of malignant neoplasm, unspecified: Secondary | ICD-10-CM | POA: Insufficient documentation

## 2017-09-15 DIAGNOSIS — G546 Phantom limb syndrome with pain: Secondary | ICD-10-CM | POA: Insufficient documentation

## 2017-09-15 DIAGNOSIS — Z85528 Personal history of other malignant neoplasm of kidney: Secondary | ICD-10-CM | POA: Insufficient documentation

## 2017-09-15 MED ORDER — HYDROCODONE-ACETAMINOPHEN 7.5-325 MG PO TABS: 1.0000 | ORAL_TABLET | Freq: Three times a day (TID) | ORAL | 0 refills | Status: DC | PRN

## 2017-09-15 MED ORDER — GABAPENTIN 300 MG PO CAPS
300.0000 mg | ORAL_CAPSULE | Freq: Three times a day (TID) | ORAL | 3 refills | Status: DC
Start: 2017-09-15 — End: 2018-03-28

## 2017-09-15 MED ORDER — GABAPENTIN 300 MG PO CAPS: 300.0000 mg | ORAL_CAPSULE | Freq: Three times a day (TID) | ORAL | 3 refills | Status: DC

## 2017-09-15 NOTE — Patient Instructions (Addendum)
You have been given 3 Rx for Hydrocodone/APAP to last until 01/09/18 Gabapentin has been faxed to [facility]

## 2017-09-15 NOTE — Telephone Encounter (Signed)
Patient is back home and returning your call. Please call

## 2017-09-15 NOTE — Progress Notes (Signed)
Nursing Pain Medication Assessment:  Safety precautions to be maintained throughout the outpatient stay will include: orient to surroundings, keep bed in low position, maintain call bell within reach at all times, provide assistance with transfer out of bed and ambulation.  Medication Inspection Compliance: Pill count conducted under aseptic conditions, in front of the patient. Neither the pills nor the bottle was removed from the patient's sight at any time. Once count was completed pills were immediately returned to the patient in their original bottle.  Medication: Hydrocodone/APAP Pill/Patch Count: 77 of 90 pills remain Pill/Patch Appearance: Markings consistent with prescribed medication Bottle Appearance: Standard pharmacy container. Clearly labeled. Filled Date: 40/23/ 2019 Last Medication intake:  Today

## 2017-09-15 NOTE — Progress Notes (Signed)
Patient's Name: Amy [name]  MRN: [id number]  Referring Provider: Roselee Nova, MD  DOB: 1936/02/21  PCP: Arnetha Courser, MD  DOS: 09/15/2017  Note by: Gillis Santa, MD  Service setting: Ambulatory outpatient  Specialty: Interventional Pain Management  Location: [facility] (AMB) Pain Management Facility    Patient type: Established   Primary Reason(s) for Visit: Encounter for prescription drug management. (Level of risk: moderate)  CC: Leg Pain (left) and Knee Pain (right)  HPI  Amy [name] is a 82 y.o. year old, female patient, who comes today for a medication management evaluation. She has Phantom limb syndrome with pain ([place]); Anxiety; Elevated serum creatinine; Diabetes mellitus type 2, controlled ([place]); GERD (gastroesophageal reflux disease); GAD (generalized anxiety disorder); AB (asthmatic bronchitis); Aortic atherosclerosis ([place]); Cataract; Leg pain; Continuous opioid dependence (HCC); CKD (chronic kidney disease) stage 4, GFR 15-29 ml/min (HCC); COPD, mild ([place]); Encounter for general adult medical examination without abnormal findings; Excessive falling; Fatty infiltration of liver; H/O adenomatous polyp of colon; HTN (hypertension); Hyperlipidemia; Anterior knee pain; LBP (low back pain); Malaise and fatigue; OP (osteoporosis); Parity 1; Abnormal presence of protein in urine; PVD (peripheral vascular disease) ([place]); Allergic rhinitis; At risk for falling; Renal mass; Abnormal EKG; Pre-operative cardiovascular examination; Renal neoplasm; Need for immunization against influenza; S/p nephrectomy; 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 Her primarily concern today is the Leg Pain (left) and Knee Pain (right)  Pain  Assessment: Location: Left Leg Radiating: denies Onset: More than a month ago Duration: Chronic pain Quality: Burning Severity: 0-No pain/10 (self-reported pain score)  Note: Reported level is compatible with observation.                         When using our objective Pain Scale, levels between 6 and 10/10 are said to belong in an emergency room, as it progressively worsens from a 6/10, described as severely limiting, requiring emergency care not usually available at an outpatient pain management facility. At a 6/10 level, communication becomes difficult and requires great effort. Assistance to reach the emergency department may be required. Facial flushing and profuse sweating along with potentially dangerous increases in heart rate and blood pressure will be evident. Effect on ADL:   Timing: Intermittent Modifying factors:    Amy [name] was last scheduled for an appointment on 06/21/2017 for medication management. During today's appointment we reviewed Amy [name] chronic pain status, as well as her outpatient medication regimen.  Patient presents for follow-up.  She is here for medication refill.  No change in her medical history since last visit.  She is doing fairly well and at baseline.    The patient  reports that she does not use drugs. Her body mass index is 33.65 kg/m.  Further details on both, my assessment(s), as well as the proposed treatment plan, please see below.  Controlled Substance Pharmacotherapy Assessment REMS (Risk Evaluation and Mitigation Strategy)  Analgesic:Hydrocodone 7.5 mg 3 times daily as needed MME/day:Approximately 24m/day.   WLandis Martins RN  09/15/2017 11:48 AM  Sign at close encounter Nursing Pain Medication Assessment:  Safety precautions to be maintained throughout the outpatient stay will include: orient to surroundings, keep bed in low position, maintain call bell within reach at all times, provide assistance with transfer  out of bed  and ambulation.  Medication Inspection Compliance: Pill count conducted under aseptic conditions, in front of the patient. Neither the pills nor the bottle was removed from the patient's sight at any time. Once count was completed pills were immediately returned to the patient in their original bottle.  Medication: Hydrocodone/APAP Pill/Patch Count: 77 of 90 pills remain Pill/Patch Appearance: Markings consistent with prescribed medication Bottle Appearance: Standard pharmacy container. Clearly labeled. Filled Date: 40/23/ 2019 Last Medication intake:  Today   Pharmacokinetics: Liberation and absorption (onset of action): WNL Distribution (time to peak effect): WNL Metabolism and excretion (duration of action): WNL         Pharmacodynamics: Desired effects: Analgesia: Amy [name] reports >50% benefit. Functional ability: Patient reports that medication allows her to accomplish basic ADLs Clinically meaningful improvement in function (CMIF): Sustained CMIF goals met Perceived effectiveness: Described as relatively effective, allowing for increase in activities of daily living (ADL) Undesirable effects: Side-effects or Adverse reactions: None reported Monitoring: [place] PMP: Online review of the past 68-monthperiod conducted. Compliant with practice rules and regulations Last UDS on record: No results found for: SUMMARY UDS interpretation: Compliant          Medication Assessment Form: Reviewed. Patient indicates being compliant with therapy Treatment compliance: Compliant Risk Assessment Profile: Aberrant behavior: See prior evaluations. None observed or detected today Comorbid factors increasing risk of overdose: See prior notes. No additional risks detected today Risk of substance use disorder (SUD): Low  ORT Scoring interpretation table:  Score <3 = Low Risk for SUD  Score between 4-7 = Moderate Risk for SUD  Score >8 = High Risk for Opioid Abuse   Risk Mitigation Strategies:   Patient Counseling: Covered Patient-Prescriber Agreement (PPA): Present and active  Notification to other healthcare providers: Done  Pharmacologic Plan: No change in therapy, at this time.             Laboratory Chemistry  Inflammation Markers (CRP: Acute Phase) (ESR: Chronic Phase) Lab Results  Component Value Date   ESRSEDRATE 19 01/27/2017                         Rheumatology Markers Lab Results  Component Value Date   RF <14 01/27/2017   ANA NEGATIVE 01/27/2017                        Renal Function Markers Lab Results  Component Value Date   BUN 13 09/01/2017   CREATININE 1.33 (H) 09/01/2017   GFRAA 43 (L) 09/01/2017   GFRNONAA 37 (L) 09/01/2017                              Hepatic Function Markers Lab Results  Component Value Date   AST 24 09/01/2017   ALT 13 (L) 09/01/2017   ALBUMIN 3.1 (L) 09/01/2017   ALKPHOS 115 09/01/2017   LIPASE 50 08/12/2017                        Electrolytes Lab Results  Component Value Date   NA 137 09/01/2017   K 3.5 09/01/2017   CL 104 09/01/2017   CALCIUM 8.3 (L) 09/01/2017   MG 1.2 (L) 10/05/2012                        Neuropathy Markers Lab Results  Component  Value Date   VITAMINB12 456 03/31/2016   FOLATE 9.8 07/14/2016   HGBA1C 5.9 06/15/2017                        Bone Pathology Markers No results found for: VD25OH, [id number], [id number], OT1572IO0, 25OHVITD1, 25OHVITD2, 25OHVITD3, TESTOFREE, TESTOSTERONE                       Coagulation Parameters Lab Results  Component Value Date   PLT 222 09/01/2017                        Cardiovascular Markers Lab Results  Component Value Date   BNP 195 (H) 08/15/2014   TROPONINI <0.03 09/01/2017   HGB 12.3 09/01/2017   HCT 36.9 09/01/2017                         CA Markers No results found for: CEA, CA125, LABCA2                      Note: Lab results reviewed.  Recent Diagnostic Imaging Results  DG Abd 2 Views Addendum: ADDENDUM REPORT: 09/13/2017  10:49   ADDENDUM:  The following addendum was performed at the request of the ordering  provider. Cholecystectomy clips noted in the right upper quadrant of  the abdomen. No common bile duct or pancreatic duct stent  identified.   Electronically Signed    By: Kerby Moors M.D.    On: 09/13/2017 10:49 Narrative: CLINICAL DATA:  82 year old female with abdominal pain nausea vomiting and weakness for 1 week.  EXAM: ABDOMEN - 2 VIEW  COMPARISON:  ERCP images [id number]. CT Abdomen and Pelvis 08/12/2017 and earlier.  FINDINGS: Upright and supine views of the abdomen and pelvis. Stable cholecystectomy clips. Chronic atelectasis at the left lung base. Calcified aortic atherosclerosis. No pneumoperitoneum.  Non obstructed bowel gas pattern. Gas in nondilated small and large bowel loops to the level of the descending or sigmoid colon. Bilateral external iliac artery vascular stents, continuing to the femoral artery on the right. Left inguinal surgical clips. Chronic pelvic phleboliths. Osteopenia. No acute osseous abnormality identified.  IMPRESSION: 1.  Normal bowel gas pattern, no free air. 2.  Aortic Atherosclerosis (ICD10-I70.0).  Electronically Signed: By: Genevie Ann M.D. On: 09/05/2017 12:54  Complexity Note: Imaging results reviewed. Results shared with Amy [name], using Layman's terms.                         Meds   Current Outpatient Medications:  .  acetaminophen (TYLENOL) 500 MG tablet, Take 1,000 mg by mouth every 6 (six) hours as needed for mild pain, fever or headache. , Disp: , Rfl:  .  albuterol (PROVENTIL HFA;VENTOLIN HFA) 108 (90 Base) MCG/ACT inhaler, Inhale 2 puffs into the lungs every 6 (six) hours as needed for wheezing or shortness of breath., Disp: 1 Inhaler, Rfl: 2 .  AMBULATORY NON FORMULARY MEDICATION, Medication Name: Flutter valve use after nebulizer treatment., Disp: 1 each, Rfl: 0 .  atorvastatin (LIPITOR) 20 MG tablet, Take 1 tablet (20 mg total) by  mouth at bedtime., Disp: 90 tablet, Rfl: 0 .  busPIRone (BUSPAR) 10 MG tablet, Take 10 mg by mouth 3 (three) times daily., Disp: , Rfl:  .  calcitRIOL (ROCALTROL) 0.25 MCG capsule, Take 0.25 mcg by mouth 3 (three) times a week., Disp: ,  Rfl:  .  cholestyramine (QUESTRAN) 4 g packet, Take 1 packet (4 g total) by mouth 2 (two) times daily with a meal., Disp: 60 each, Rfl: 11 .  citalopram (CELEXA) 20 MG tablet, Take 2 tablets (40 mg total) by mouth at bedtime., Disp: 90 tablet, Rfl: 0 .  clindamycin (CLEOCIN) 300 MG capsule, , Disp: , Rfl:  .  clopidogrel (PLAVIX) 75 MG tablet, Take 1 tablet (75 mg total) by mouth daily., Disp: 90 tablet, Rfl: 3 .  donepezil (ARICEPT) 10 MG tablet, Take 1 tablet (10 mg total) by mouth at bedtime., Disp: 30 tablet, Rfl: 3 .  furosemide (LASIX) 20 MG tablet, Take 20 mg by mouth daily. , Disp: , Rfl:  .  gabapentin (NEURONTIN) 300 MG capsule, Take 1 capsule (300 mg total) by mouth 3 (three) times daily., Disp: 270 capsule, Rfl: 3 .  glucose blood test strip, Use as instructed., Disp: 100 each, Rfl: 12 .  HYDROcodone-acetaminophen (NORCO) 7.5-325 MG tablet, Take 1 tablet by mouth 3 (three) times daily as needed., Disp: 90 tablet, Rfl: 0 .  ipratropium-albuterol (DUONEB) 0.5-2.5 (3) MG/3ML SOLN, Take 3 mLs by nebulization 3 (three) times daily. J44.9, Disp: 360 mL, Rfl: 0 .  metoprolol tartrate (LOPRESSOR) 50 MG tablet, Take 1 tablet (50 mg total) by mouth 2 (two) times daily., Disp: 180 tablet, Rfl: 0 .  pantoprazole (PROTONIX) 40 MG tablet, Take 1 tablet (40 mg total) by mouth daily., Disp: 90 tablet, Rfl: 1 .  ranitidine (ZANTAC) 150 MG capsule, Take 1 capsule (150 mg total) by mouth at bedtime., Disp: 90 capsule, Rfl: 3  ROS  Constitutional: Denies any fever or chills Gastrointestinal: No reported hemesis, hematochezia, vomiting, or acute GI distress Musculoskeletal: Denies any acute onset joint swelling, redness, loss of ROM, or weakness Neurological: No reported  episodes of acute onset apraxia, aphasia, dysarthria, agnosia, amnesia, paralysis, loss of coordination, or loss of consciousness  Allergies  Amy [name] is allergic to fentanyl and penicillins.  [place]  Drug: Amy [name]  reports that she does not use drugs. Alcohol:  reports that she drank alcohol. Tobacco:  reports that she quit smoking about 13 years ago. Her smoking use included cigarettes. She has a 76.50 pack-year smoking history. She has never used smokeless tobacco. Medical:  has a past medical history of Anemia, Anxiety, Arthritis, Bilateral cataracts, Bronchitis, Cancer of kidney, left ([place]) (2017), Cataract, Chronic kidney disease, Depression, Diabetes mellitus without complication ([place]), Dry cough, DVT (deep venous thrombosis) ([place]), GERD (gastroesophageal reflux disease), Headache, History of frequent urinary tract infections, Hyperlipidemia, Hypertension, Left renal mass, Numbness and tingling, Obesity, Peripheral artery disease ([place]), Urinary frequency, Urinary incontinence, and Wheezing. Surgical: Amy [name]  has a past surgical history that includes Leg amputation; Abdominal hysterectomy (1978); Cholecystectomy; Carpal tunnel release (Bilateral); stent placement right leg ; Robot assisted laparoscopic nephrectomy (Left, 01/15/2015); Colonoscopy; Colonoscopy with esophagogastroduodenoscopy (egd); and ERCP (N/A, 08/19/2017). Family: family history includes Cancer in her brother, father, and mother; Diabetes in her brother.  Constitutional Exam  General appearance: Well nourished, well developed, and well hydrated. In no apparent acute distress Vitals:   09/15/17 1140  BP: 119/79  Pulse: (!) 51  Resp: 18  Temp: 98.2 F (36.8 C)  TempSrc: Oral  SpO2: 98%  Weight: 184 lb (83.5 kg)  Height: 5' 2"  (1.575 m)   BMI Assessment: Estimated body mass index is 33.65 kg/m as calculated from the following:   Height as of this encounter: 5' 2"  (1.575 m).   Weight  as of this encounter:  184 lb (83.5 kg).  BMI interpretation table: BMI level Category Range association with higher incidence of chronic pain  <18 kg/m2 Underweight   18.5-24.9 kg/m2 Ideal body weight   25-29.9 kg/m2 Overweight Increased incidence by 20%  30-34.9 kg/m2 Obese (Class I) Increased incidence by 68%  35-39.9 kg/m2 Severe obesity (Class II) Increased incidence by 136%  >40 kg/m2 Extreme obesity (Class III) Increased incidence by 254%   Patient's current BMI Ideal Body weight  Body mass index is 33.65 kg/m. Ideal body weight: 50.1 kg (110 lb 7.2 oz) Adjusted ideal body weight: 63.4 kg (139 lb 13.9 oz)   BMI Readings from Last 4 Encounters:  09/15/17 33.65 kg/m  09/05/17 33.65 kg/m  09/05/17 33.65 kg/m  09/01/17 33.65 kg/m   Wt Readings from Last 4 Encounters:  09/15/17 184 lb (83.5 kg)  09/05/17 184 lb (83.5 kg)  09/01/17 184 lb (83.5 kg)  08/31/17 184 lb (83.5 kg)  Psych/Mental status: Alert, oriented x 3 (person, place, & time)       Eyes: PERLA Respiratory: No evidence of acute respiratory distress  Cervical Spine Area Exam  Skin & Axial Inspection: No masses, redness, edema, swelling, or associated skin lesions Alignment: Symmetrical Functional ROM: Unrestricted ROM      Stability: No instability detected Muscle Tone/Strength: Functionally intact. No obvious neuro-muscular anomalies detected. Sensory (Neurological): Unimpaired Palpation: No palpable anomalies              Upper Extremity (UE) Exam    Side: Right upper extremity  Side: Left upper extremity  Skin & Extremity Inspection: Skin color, temperature, and hair growth are WNL. No peripheral edema or cyanosis. No masses, redness, swelling, asymmetry, or associated skin lesions. No contractures.  Skin & Extremity Inspection: Skin color, temperature, and hair growth are WNL. No peripheral edema or cyanosis. No masses, redness, swelling, asymmetry, or associated skin lesions. No contractures.  Functional ROM: Unrestricted  ROM          Functional ROM: Unrestricted ROM          Muscle Tone/Strength: Functionally intact. No obvious neuro-muscular anomalies detected.  Muscle Tone/Strength: Functionally intact. No obvious neuro-muscular anomalies detected.  Sensory (Neurological): Unimpaired          Sensory (Neurological): Unimpaired          Palpation: No palpable anomalies              Palpation: No palpable anomalies              Specialized Test(s): Deferred         Specialized Test(s): Deferred          Thoracic Spine Area Exam  Skin & Axial Inspection: No masses, redness, or swelling Alignment: Symmetrical Functional ROM: Unrestricted ROM Stability: No instability detected Muscle Tone/Strength: Functionally intact. No obvious neuro-muscular anomalies detected. Sensory (Neurological): Unimpaired Muscle strength & Tone: No palpable anomalies  Lumbar Spine Area Exam  Skin & Axial Inspection: No masses, redness, or swelling Alignment: Symmetrical Functional ROM: Unrestricted ROM       Stability: No instability detected Muscle Tone/Strength: Functionally intact. No obvious neuro-muscular anomalies detected. Sensory (Neurological): Unimpaired Palpation: No palpable anomalies       Provocative Tests: Lumbar Hyperextension and rotation test: evaluation deferred today       Lumbar Lateral bending test: evaluation deferred today       Patrick's Maneuver: evaluation deferred today  Gait & Posture Assessment  Patient in wheelchair. Significant kyphosis.   Lower Extremity Exam    Side: Right lower extremity  Side: Left lower extremity  Skin & Extremity Inspection: Skin color, temperature, and hair growth are WNL. No peripheral edema or cyanosis. No masses, redness, swelling, asymmetry, or associated skin lesions. No contractures.  Skin & Extremity Inspection: Above knee amputation (AKA)  Functional ROM: Unrestricted ROM          Functional ROM: Decreased ROM          Muscle Tone/Strength:  Functionally intact. No obvious neuro-muscular anomalies detected.  Muscle Tone/Strength: Functionally intact. No obvious neuro-muscular anomalies detected.  Sensory (Neurological): Unimpaired  Sensory (Neurological): Paresthesia (Tingling sensation)  Palpation: No palpable anomalies  Palpation: No palpable anomalies   Assessment  Primary Diagnosis & Pertinent Problem List: The primary encounter diagnosis was Phantom limb syndrome with pain ([place]). 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  Status Diagnosis  Controlled Controlled Controlled 1. Phantom limb syndrome with pain ([place])   2. Chronic pain syndrome   3. CKD (chronic kidney disease) stage 4, GFR 15-29 ml/min (HCC)   4. Late onset Alzheimer's disease with behavioral disturbance   5. S/P AKA (above knee amputation) unilateral, left ([place])   6. 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 Treatment will often involve judicious use of pain medications and interventional procedures to decrease the pain, allowing the patient to participate in the physical activity that will ultimately produce long-lasting pain reductions. The goal of the multidisciplinary approach is to return the patient to a higher level of overall function and to restore their ability to perform activities of daily living.  82 year old female with a history of  left above-the-knee amputation, phantom limb syndrome, chronic kidney disease, advanced dementia secondary to Alzheimer's disease with phantom limb pain and bilateral hip pain. Patient has been stable on her opioid regimen of hydrocodone 7.5 mg 3 times daily as needed. She is also on gabapentin 300 mg 3 times daily.  Here for medication refill on her hydrocodone and Gabapentin.  PMP checked and appropriate.  Plan: -Hydrocodone at its current dose, 7.5 mg 3 times daily as needed. Prescription for 3 months -Continue gabapentin 300 mg 3 times daily.   Plan of Care  Pharmacotherapy (Medications Ordered): Meds ordered this encounter  Medications  . DISCONTD: HYDROcodone-acetaminophen (NORCO) 7.5-325 MG tablet    Sig: Take 1 tablet by mouth 3 (three) times daily as needed.    Dispense:  90 tablet    Refill:  0    For chronic pain To be filled on or after: 10/12/2017, 11/11/2017, 12/10/2017  . DISCONTD: gabapentin (NEURONTIN) 300 MG capsule    Sig: Take 1 capsule (300 mg total) by mouth 3 (three) times daily.    Dispense:  270 capsule    Refill:  3  . DISCONTD: HYDROcodone-acetaminophen (NORCO) 7.5-325 MG tablet    Sig: Take 1 tablet by mouth 3 (three) times daily  as needed.    Dispense:  90 tablet    Refill:  0    For chronic pain To be filled on or after: 10/12/2017, 11/11/2017, 12/10/2017  . DISCONTD: HYDROcodone-acetaminophen (NORCO) 7.5-325 MG tablet    Sig: Take 1 tablet by mouth 3 (three) times daily as needed.    Dispense:  90 tablet    Refill:  0    For chronic pain To be filled on or after: 10/12/2017, 11/11/2017, 12/10/2017  . HYDROcodone-acetaminophen (NORCO) 7.5-325 MG tablet    Sig: Take 1 tablet by mouth 3 (three) times daily as needed.    Dispense:  90 tablet    Refill:  0    For chronic pain To be filled on or after: 10/12/2017, 11/11/2017, 12/10/2017  . gabapentin (NEURONTIN) 300 MG capsule    Sig: Take 1 capsule (300 mg total) by mouth 3 (three) times daily.    Dispense:   270 capsule    Refill:  3   Time Note: Greater than 50% of the 25 minute(s) of face-to-face time spent with Amy [name], was spent in counseling/coordination of care regarding: Amy [name] primary cause of pain, medication side effects, the opioid analgesic risks and possible complications, the appropriate use of her medications, the medication agreement and the patient's responsibilities when it comes to controlled substances.  Provider-requested follow-up: Return in about 4 months (around 01/03/2018) for Medication Management.  Future Appointments  Date Time Provider [place]  09/27/2017  8:30 AM Virgel Manifold, MD [facility] None  09/28/2017 10:00 AM Alfonzo Feller, RN [facility]COM None  12/05/2017 11:00 AM Laverle Hobby, MD [facility] None  12/23/2017 10:30 AM [facility] VASC 2 [facility] None  12/23/2017 11:30 AM Dew, Erskine Squibb, MD [facility] None  12/27/2017 11:45 AM Gillis Santa, MD [facility] None  02/13/2018  2:00 PM [facility]-CT [facility]  03/01/2018  2:30 PM [facility] LAB [facility] None  03/01/2018  2:45 PM Corcoran, Drue Second, MD [facility] None    Primary Care Physician: Arnetha Courser, MD Location: [facility] Outpatient Pain Management Facility Note by: Gillis Santa, M.D Date: 09/15/2017; Time: 12:43 PM  Patient Instructions  You have been given 3 Rx for Hydrocodone/APAP to last until 01/09/18 Gabapentin has been faxed to [facility]

## 2017-09-19 DIAGNOSIS — J449 Chronic obstructive pulmonary disease, unspecified: Secondary | ICD-10-CM | POA: Diagnosis not present

## 2017-09-19 DIAGNOSIS — F411 Generalized anxiety disorder: Secondary | ICD-10-CM | POA: Diagnosis not present

## 2017-09-19 DIAGNOSIS — E782 Mixed hyperlipidemia: Secondary | ICD-10-CM | POA: Diagnosis not present

## 2017-09-19 DIAGNOSIS — I208 Other forms of angina pectoris: Secondary | ICD-10-CM | POA: Diagnosis not present

## 2017-09-19 DIAGNOSIS — G546 Phantom limb syndrome with pain: Secondary | ICD-10-CM | POA: Diagnosis not present

## 2017-09-19 DIAGNOSIS — R0602 Shortness of breath: Secondary | ICD-10-CM | POA: Diagnosis not present

## 2017-09-19 DIAGNOSIS — G301 Alzheimer's disease with late onset: Secondary | ICD-10-CM | POA: Diagnosis not present

## 2017-09-19 DIAGNOSIS — R011 Cardiac murmur, unspecified: Secondary | ICD-10-CM | POA: Diagnosis not present

## 2017-09-19 DIAGNOSIS — Z8639 Personal history of other endocrine, nutritional and metabolic disease: Secondary | ICD-10-CM | POA: Diagnosis not present

## 2017-09-19 NOTE — Telephone Encounter (Signed)
I have already spoke with husband. No phone call since that time. Pt does have some dementia.

## 2017-09-20 ENCOUNTER — Encounter: Payer: Self-pay | Admitting: Family Medicine

## 2017-09-26 ENCOUNTER — Institutional Professional Consult (permissible substitution): Payer: Medicare HMO | Admitting: Internal Medicine

## 2017-09-27 ENCOUNTER — Other Ambulatory Visit: Payer: Self-pay

## 2017-09-27 ENCOUNTER — Ambulatory Visit: Payer: Medicare HMO | Admitting: Gastroenterology

## 2017-09-27 ENCOUNTER — Encounter: Payer: Self-pay | Admitting: Gastroenterology

## 2017-09-27 VITALS — BP 106/56 | HR 50 | Temp 97.7°F | Ht 62.0 in | Wt 184.0 lb

## 2017-09-27 DIAGNOSIS — K8050 Calculus of bile duct without cholangitis or cholecystitis without obstruction: Secondary | ICD-10-CM

## 2017-09-27 NOTE — Progress Notes (Addendum)
Amy Antigua, MD [address]  [place]  [place], [place] [address]  Main: [phone]  Fax: [phone]   Primary Care Physician: Arnetha Courser, MD  Primary Gastroenterologist:  Dr. Vonda [name]  Chief Complaint  Patient presents with  . Follow-up    8month   HPI: Amy WINTONis a 82y.o. female here for follow-up of choledocholithiasis.Patient was admitted to the hospital on August 13, 2017 with choledocholithiasis.  Has previous history of cholecystectomy.  She underwent ERCP with stone removal, and pancreatic duct stent [facility] as Dr. WAllen Norriswas out of town at the time.  She did well subsequently.  She had underwent abdominal x-ray on September 13, 2017, as per Dr. OErlinda Hongrecommendation to ensure pancreatic duct is not there anymore.  See radiology report, that does not identify any stents in the exam.  Patient states her abdominal pain has resolved.  No jaundice.  Good appetite.  No nausea or vomiting.  No altered bowel habits.  No blood in stool.  No dysphagia.  Current Outpatient Medications  Medication Sig Dispense Refill  . Nebulizers MISC by Does not apply route.    .Marland Kitchenacetaminophen (TYLENOL) 500 MG tablet Take 1,000 mg by mouth every 6 (six) hours as needed for mild pain, fever or headache.     . albuterol (PROVENTIL HFA;VENTOLIN HFA) 108 (90 Base) MCG/ACT inhaler Inhale 2 puffs into the lungs every 6 (six) hours as needed for wheezing or shortness of breath. 1 Inhaler 2  . AMBULATORY NON FORMULARY MEDICATION Medication Name: Flutter valve use after nebulizer treatment. 1 each 0  . atorvastatin (LIPITOR) 20 MG tablet Take 1 tablet (20 mg total) by mouth at bedtime. 90 tablet 0  . busPIRone (BUSPAR) 10 MG tablet Take 10 mg by mouth 3 (three) times daily.    . calcitRIOL (ROCALTROL) 0.25 MCG capsule Take 0.25 mcg by mouth 3 (three) times a week.    . cholestyramine (QUESTRAN) 4 g packet Take 1 packet (4 g total) by mouth  2 (two) times daily with a meal. 60 each 11  . citalopram (CELEXA) 20 MG tablet Take 2 tablets (40 mg total) by mouth at bedtime. 90 tablet 0  . clindamycin (CLEOCIN) 300 MG capsule     . clopidogrel (PLAVIX) 75 MG tablet Take 1 tablet (75 mg total) by mouth daily. 90 tablet 3  . donepezil (ARICEPT) 10 MG tablet Take 1 tablet (10 mg total) by mouth at bedtime. 30 tablet 3  . furosemide (LASIX) 20 MG tablet Take 20 mg by mouth daily.     .Marland Kitchengabapentin (NEURONTIN) 300 MG capsule Take 1 capsule (300 mg total) by mouth 3 (three) times daily. 270 capsule 3  . glucose blood test strip Use as instructed. 100 each 12  . HYDROcodone-acetaminophen (NORCO) 7.5-325 MG tablet Take 1 tablet by mouth 3 (three) times daily as needed. 90 tablet 0  . ipratropium-albuterol (DUONEB) 0.5-2.5 (3) MG/3ML SOLN Take 3 mLs by nebulization 3 (three) times daily. J44.9 360 mL 0  . metoprolol tartrate (LOPRESSOR) 50 MG tablet Take 1 tablet (50 mg total) by mouth 2 (two) times daily. 180 tablet 0  . pantoprazole (PROTONIX) 40 MG tablet Take 1 tablet (40 mg total) by mouth daily. 90 tablet 1  . ranitidine (ZANTAC) 150 MG capsule Take 1 capsule (150 mg total) by mouth at bedtime. 90 capsule 3   No current facility-administered medications for this visit.  Allergies as of 09/27/2017 - Review Complete 09/27/2017  Allergen Reaction Noted  . Fentanyl Other (See Comments) 11/05/2014  . Penicillins Itching, Rash, and Other (See Comments) 11/05/2014    ROS:  General: Negative for anorexia, weight loss, fever, chills, fatigue, weakness. ENT: Negative for hoarseness, difficulty swallowing , nasal congestion. CV: Negative for chest pain, angina, palpitations, dyspnea on exertion, peripheral edema.  Respiratory: Negative for dyspnea at rest, dyspnea on exertion, cough, sputum, wheezing.  GI: See history of present illness. GU:  Negative for dysuria, hematuria, urinary incontinence, urinary frequency, nocturnal urination.    Endo: Negative for unusual weight change.    Physical Examination:   BP (!) 106/56   Pulse (!) 50   Temp 97.7 F (36.5 C) (Oral)   Ht _0  (1.575 m)   Wt 184 lb (83.5 kg) Comment: amputee, weight at hospital  BMI 33.65 kg/m   General: Well-nourished, well-developed in no acute distress.  Eyes: No icterus. Conjunctivae pink. Mouth: Oropharyngeal mucosa moist and pink , no lesions erythema or exudate. Neck: Supple, Trachea midline Abdomen: Bowel sounds are normal, nontender, nondistended, no hepatosplenomegaly or masses, no abdominal bruits or hernia , no rebound or guarding.   Extremities: No lower extremity edema. No clubbing or deformities. Neuro: Alert and oriented x 3.  Grossly intact. Skin: Warm and dry, no jaundice.   Psych: Alert and cooperative, normal mood and affect.   Labs: CMP     Component Value Date/Time   NA 137 09/01/2017 0934   NA 138 01/06/2016 1006   NA 135 08/16/2014 0510   K 3.5 09/01/2017 0934   K 3.7 08/16/2014 0510   CL 104 09/01/2017 0934   CL 102 08/16/2014 0510   CO2 26 09/01/2017 0934   CO2 25 08/16/2014 0510   GLUCOSE 132 (H) 09/01/2017 0934   GLUCOSE 98 08/16/2014 0510   BUN 13 09/01/2017 0934   BUN 23 01/06/2016 1006   BUN 20 08/16/2014 0510   CREATININE 1.33 (H) 09/01/2017 0934   CREATININE 1.66 (H) 08/11/2017 1240   CALCIUM 8.3 (L) 09/01/2017 0934   CALCIUM 7.7 (L) 08/16/2014 0510   PROT 6.4 (L) 09/01/2017 0934   PROT 6.1 08/27/2015 1430   PROT 6.1 (L) 10/04/2012 1208   ALBUMIN 3.1 (L) 09/01/2017 0934   ALBUMIN 3.8 08/27/2015 1430   ALBUMIN 3.4 10/04/2012 1208   AST 24 09/01/2017 0934   AST 22 10/04/2012 1208   ALT 13 (L) 09/01/2017 0934   ALT 20 10/04/2012 1208   ALKPHOS 115 09/01/2017 0934   ALKPHOS 60 10/04/2012 1208   BILITOT 1.3 (H) 09/01/2017 0934   BILITOT 0.6 08/27/2015 1430   BILITOT 0.8 10/04/2012 1208   GFRNONAA 37 (L) 09/01/2017 0934   GFRNONAA 29 (L) 08/11/2017 1240   GFRAA 43 (L) 09/01/2017 0934   GFRAA  34 (L) 08/11/2017 1240   Lab Results  Component Value Date   WBC 12.2 (H) 09/01/2017   HGB 12.3 09/01/2017   HCT 36.9 09/01/2017   MCV 92.4 09/01/2017   PLT 222 09/01/2017    Imaging Studies: Dg Chest 2 View  Result Date: 09/01/2017 CLINICAL DATA:  Chest pain EXAM: CHEST - 2 VIEW COMPARISON:  08/08/2017 FINDINGS: Mild cardiomegaly. Bibasilar atelectasis or scarring. No effusions. Severe compression fracture in the lower thoracic spine, likely T12, stable since prior imaging. IMPRESSION: Mild cardiomegaly.  Bibasilar atelectasis or scarring. Electronically Signed   By: Rolm Baptise M.D.   On: 09/01/2017 10:36   Dg Abd 2 Views  Addendum Date: 09/13/2017   ADDENDUM REPORT: 09/13/2017 10:49 ADDENDUM: The following addendum was performed at the request of the ordering provider. Cholecystectomy clips noted in the right upper quadrant of the abdomen. No common bile duct or pancreatic duct stent identified. Electronically Signed   By: Kerby Moors M.D.   On: 09/13/2017 10:49   Result Date: 09/13/2017 CLINICAL DATA:  82 year old female with abdominal pain nausea vomiting and weakness for 1 week. EXAM: ABDOMEN - 2 VIEW COMPARISON:  ERCP images 16109. CT Abdomen and Pelvis 08/12/2017 and earlier. FINDINGS: Upright and supine views of the abdomen and pelvis. Stable cholecystectomy clips. Chronic atelectasis at the left lung base. Calcified aortic atherosclerosis. No pneumoperitoneum. Non obstructed bowel gas pattern. Gas in nondilated small and large bowel loops to the level of the descending or sigmoid colon. Bilateral external iliac artery vascular stents, continuing to the femoral artery on the right. Left inguinal surgical clips. Chronic pelvic phleboliths. Osteopenia. No acute osseous abnormality identified. IMPRESSION: 1.  Normal bowel gas pattern, no free air. 2.  Aortic Atherosclerosis (ICD10-I70.0). Electronically Signed: By: Genevie Ann M.D. On: 09/05/2017 12:54    Assessment and Plan:    Amy [name] is a 82 y.o. y/o female here for follow-up choledocholithiasis  Patient status post ERCP with stone extraction, and pancreatic duct stent placement by Dr. Paulita Fujita on August 19, 2017 Dr. Paulita Fujita recommended repeat x-ray to ensure that stent does not need to be removed endoscopically. subsequent x-ray reveals that the stent was not in place anymore as expected Patient has improved as expected Has private her history of cholecystectomy No abdominal pain, Liver enzymes significantly improved since her admission.  Alk phos is completely normal.  Total bilirubin mildly elevated but much improved from prior to stone extraction Thus clinical exam, and lab findings, are consistent with resolution of biliary duct obstruction  Patient had also complained of diarrhea on last visit Patient states this is completely improved as well Infectious work-up during last visit was normal  Patient takes Protonix in the morning, and ranitidine at bedtime for heartburn I have discussed cutting down on the Protonix, but they do not want to do this at this time, as without it, she had breakthrough symptoms I have discussed, associated side effects with PPI use, and discussing this with her primary care physician, if heartburn remains controlled, Protonix can be decreased or discontinued in the future.  They verbalized understanding (Risks of PPI use were discussed with patient including bone loss, C. Diff diarrhea, pneumonia, infections, CKD, electrolyte abnormalities.  If clinically possible based on symptoms, goal should be to maintain patient on the lowest dose possible, or discontinue the medication with institution of acid reflux lifestyle modifications over time. Pt. Verbalizes understanding and chooses to continue the medication.) Patient and family would like to continue follow-up with primary care physician from here on, given that no further interventions are needed from GI standpoint Primary  care physician can then discuss PPI use in the future depending on symptoms   No alarm symptoms present to indicate colonoscopy or upper endoscopy at this time No family history of colon cancer Last colonoscopy was by Dr. Vira Agar in May 2015.  1 diminutive transverse colon polyp was removed.  Recommendations were, no further colonoscopy needed, due to minimal findings and age.  However, on review of the pathology report, polyp showed tubular adenoma.  In addition, colonoscopy in 2012 had several polyps removed which showed tubular adenoma.  Therefore, per guidelines, polyp surveillance is recommended in  atleast 5 years from last colonoscopy.  This was discussed with the patient, with recommendations to repeat colonoscopy in 1 year, as that would be 5 years from her last colonoscopy.  Patient verbalized understanding.  We will set recall for 1 year for repeat colonoscopy.   Dr Amy [name]

## 2017-09-28 ENCOUNTER — Other Ambulatory Visit: Payer: Self-pay | Admitting: *Deleted

## 2017-09-28 NOTE — Patient Outreach (Addendum)
[facility]) Care Management   09/28/2017  Amy [name] 10/16/1935 [id number]  Amy [name] is an 82 y.o. female  Subjective:  Patient discussed feeling better than last visit.  Patient husband discussed diarrhea improved, no recent episodes since beginning [employer].  Husband states patient is getting her treatments a little better since using the nebulizer, but he would like to check and make sure patient doing it correctly.  Husband discussed patient recent follow up visit to GI doctor , and patient denies abdominal pain or vomiting .  Spouse discussed  patient had recent cardiac stress test and test came out good.     Objective:  BP 128/70 (BP Location: Right Arm, Patient Position: Sitting, Cuff Size: Large)   Pulse 70   Resp 18   SpO2 96%  Review of Systems  Constitutional: Negative.   HENT: Negative.   Eyes: Negative.   Respiratory: Negative.   Gastrointestinal: Negative.   Genitourinary: Negative.   Musculoskeletal: Negative.   Skin: Negative.   Neurological: Negative.   Endo/Heme/Allergies: Negative.   Psychiatric/Behavioral: Positive for memory loss.    Physical Exam  Constitutional: She is oriented to person, place, and time. She appears well-developed and well-nourished.  Cardiovascular: Normal rate and normal heart sounds.  Respiratory: Effort normal.  Coarse breath sounds with occasional wheezes   GI: Soft.  Musculoskeletal:  Left AKA  Neurological: She is alert and oriented to person, place, and time.  Skin: Skin is warm and dry.  Psychiatric: She has a normal mood and affect. Her behavior is normal. Judgment and thought content normal.    Encounter Medications:   Outpatient Encounter Medications as of 09/28/2017  Medication Sig Note  . acetaminophen (TYLENOL) 500 MG tablet Take 1,000 mg by mouth every 6 (six) hours as needed for mild pain, fever or headache.    . albuterol (PROVENTIL HFA;VENTOLIN HFA) 108 (90 Base) MCG/ACT  inhaler Inhale 2 puffs into the lungs every 6 (six) hours as needed for wheezing or shortness of breath.   . AMBULATORY NON FORMULARY MEDICATION Medication Name: Flutter valve use after nebulizer treatment.   [name] atorvastatin (LIPITOR) 20 MG tablet Take 1 tablet (20 mg total) by mouth at bedtime.   . busPIRone (BUSPAR) 10 MG tablet Take 10 mg by mouth 3 (three) times daily.   . calcitRIOL (ROCALTROL) 0.25 MCG capsule Take 0.25 mcg by mouth 3 (three) times a week.   . cholestyramine (QUESTRAN) 4 g packet Take 1 packet (4 g total) by mouth 2 (two) times daily with a meal.   . citalopram (CELEXA) 20 MG tablet Take 2 tablets (40 mg total) by mouth at bedtime.   . clindamycin (CLEOCIN) 300 MG capsule    . clopidogrel (PLAVIX) 75 MG tablet Take 1 tablet (75 mg total) by mouth daily.   [name] donepezil (ARICEPT) 10 MG tablet Take 1 tablet (10 mg total) by mouth at bedtime. 09/01/2017: Pt takes in the morning  . furosemide (LASIX) 20 MG tablet Take 20 mg by mouth daily.    [name] gabapentin (NEURONTIN) 300 MG capsule Take 1 capsule (300 mg total) by mouth 3 (three) times daily.   [name] glucose blood test strip Use as instructed.   [name] HYDROcodone-acetaminophen (NORCO) 7.5-325 MG tablet Take 1 tablet by mouth 3 (three) times daily as needed.   [name] ipratropium-albuterol (DUONEB) 0.5-2.5 (3) MG/3ML SOLN Take 3 mLs by nebulization 3 (three) times daily. J44.9   . metoprolol tartrate (LOPRESSOR) 50 MG tablet Take 1 tablet (  50 mg total) by mouth 2 (two) times daily.   . Nebulizers MISC by Does not apply route.   . pantoprazole (PROTONIX) 40 MG tablet Take 1 tablet (40 mg total) by mouth daily.   . ranitidine (ZANTAC) 150 MG capsule Take 1 capsule (150 mg total) by mouth at bedtime.    No facility-administered encounter medications on file as of 09/28/2017.     Functional Status:   In your present state of health, do you have any difficulty performing the following activities: 09/01/2017 08/22/2017  Hearing? Y N  Vision? N N   Comment - -  Difficulty concentrating or making decisions? Amy [name]  Walking or climbing stairs? Y Y  Comment - non ambulatory, needs tranfer assist  Dressing or bathing? Y Y  Comment - husband helps  Doing errands, shopping? Amy [name]  Preparing Food and eating ? - Y  Comment - husband does meal preparation  Using the Toilet? - Y  Comment - needs assist to commode  In the past six months, have you accidently leaked urine? - Y  Do you have problems with loss of bowel control? - Y  Managing your Finances? - Y  Comment - husband helps  Housekeeping or managing your Housekeeping? - Y  Comment - husband helps  Some recent data might be hidden    Fall/Depression Screening:    Fall Risk  09/15/2017 09/01/2017 08/31/2017  Falls in the past year? Yes Yes Yes  Number falls in past yr: 1 2 or more 2 or more  Injury with Fall? No No No  Risk Factor Category  - - -  Risk for fall due to : - - History of fall(s)  Follow up - - Falls prevention discussed;Education provided   Jeanes [name] 2/9 Scores 09/15/2017 09/01/2017 08/31/2017 08/25/2017 08/11/2017 08/08/2017 06/21/2017  PHQ - 2 Score 0 2 1 4  0 1 0  PHQ- 9 Score - - 8 13 - - -  Exception Documentation - - - - - - -  Not completed - - - - - - -    Assessment:  Routine home visit.   Patient history includes : Diabetes, COPD,Alzheimer's,chronicpain syndrome, s/p left AKA,excessive falling , hx renal cancer Recent [name] admission at [facility] 3/22- 3/25 Dx: RUQ pain , Choledocholithiasis, UTI ED visit on 4/11 for chest pain  Falls - No recent falls Chest pain - no recent complaint of chest pain  Gall stones- tolerating diet without pain or nausea.  COPD- Will benefit from continued education and support.  Dementia - Offered education/support on Dementia husband reports they are managing well at home now .   Patient will benefit from ongoing care management follow up/support     Plan:  Will plan follow up call in the next 3 weeks.  Scheduled EMMI on how  to use nebulizer viewed at visit.   [facility] CM Care Plan Problem One     Most Recent Value  Care Plan Problem One  High risk for readmission related to recent [name] admission for elevated liver enzymes, gall bladder problem , patient high fall risk .   Role Documenting the Problem One  Care Management [place] for Problem One  Active  THN Long Term Goal   Patient will not experience a [name] admission in the next 60  days   [facility]N Long Term Goal Start Date  08/16/17  Interventions for Problem One Long Term Goal  Reviewed current clinical status, reviewed upcoming medical appointments,, wellness visit  and neurologist    [name] CM Short Term Goal #1   Patient will attend all medical appointments in the next 14 days   [facility] CM Short Term Goal #1 Start Date  09/07/17 Barrie Folk revised ]  [facility] CM Short Term Goal #1 Met Date  09/28/17  [facility] CM Short Term Goal #2   Patient/ Husband will report no falls in the next 30 days   THN CM Short Term Goal #2 Start Date  08/16/17  [facility] CM Short Term Goal #2 Met Date  09/28/17    [name] CM Care Plan Problem Two     Most Recent Value  Care Plan Problem Two  Knowledge need related COPD management   Role Documenting the Problem Two  Care Management Coordinator  Care Plan for Problem Two  Active  Interventions for Problem Two Long Term Goal   Advised regarding importance of taking all medication/treatments as prescribed, notifying MD sooner for worsening symptoms of COPD.   [facility]N Long Term Goal  Patient will report incease knowledge of COPD self care management over the next 60 days   THN Long Term Goal Start Date  09/07/17  [facility] [name] CM Short Term Goal #1   Patient will report knowledge on use of respiratory devices, flutter valve and nebulizer over the next 3  weeks.  Barrie Folk restated. ]  [facility]N CM Short Term Goal #1 Start Date  09/28/17 Barrie Folk restated ]  Interventions for Short Term Goal #2   Viewed EMMI on use of nebulizer , patient with return demonstration .   [facility]N CM  Short Term Goal #2   Patient will be able to recognize at least 3 symptoms of worsening of COPD in the next 30 days   [facility] CM Short Term Goal #2 Start Date  09/28/17  Interventions for Short Term Goal #2  Reviewed education on COPD worsening symptoms  and action plan , enouraged keeping COPD magnet posted.       Joylene Draft, RN, [place] Management Coordinator  [phone]- Mobile [phone]- Toll Free Main Office

## 2017-09-30 ENCOUNTER — Ambulatory Visit (INDEPENDENT_AMBULATORY_CARE_PROVIDER_SITE_OTHER): Payer: Medicare HMO

## 2017-09-30 VITALS — BP 118/66 | HR 50 | Temp 97.3°F | Ht 62.0 in | Wt 184.0 lb

## 2017-09-30 DIAGNOSIS — Z0000 Encounter for general adult medical examination without abnormal findings: Secondary | ICD-10-CM | POA: Diagnosis not present

## 2017-09-30 NOTE — Progress Notes (Signed)
Subjective:   Amy [name] is a 82 y.o. female who presents for Medicare Annual (Subsequent) preventive examination.  Review of Systems:  N/A Cardiac Risk Factors include: advanced age (>28men, >9 women);diabetes mellitus;dyslipidemia;hypertension;obesity (BMI >30kg/m2);sedentary lifestyle     Objective:     Vitals: BP 118/66 (BP Location: Left Arm, Patient Position: Sitting, Cuff Size: Large)   Pulse (!) 50   Temp (!) 97.3 F (36.3 C) (Oral)   Ht 5\' 2"  (1.575 m)   Wt 184 lb (83.5 kg)   SpO2 91%   BMI 33.65 kg/m   Body mass index is 33.65 kg/m.  Advanced Directives 09/30/2017 08/24/2017 08/13/2017 08/12/2017 04/05/2017 03/15/2017 02/23/2017  Does Patient Have a Medical Advance Directive? No No No No No No No  Would patient like information on creating a medical advance directive? Yes (MAU/Ambulatory/Procedural Areas - Information given) - No - Patient declined No - Patient declined - - -    Tobacco Social History   Tobacco Use  Smoking Status Former Smoker  . Packs/day: 1.50  . Years: 51.00  . Pack years: 76.50  . Types: Cigarettes  . Last attempt to quit: 05/24/2004  . Years since quitting: 13.3  Smokeless Tobacco Never Used  Tobacco Comment   smoking cessation materials not required     Counseling given: No Comment: smoking cessation materials not required  Clinical Intake:  Pre-visit preparation completed: Yes  Pain : No/denies pain   BMI - recorded: 33.65 Nutritional Status: BMI > 30  Obese Nutrition Risk Assessment: Has the patient had any N/V/D within the last 2 months?  No Does the patient have any non-healing wounds?  No Has the patient had any unintentional weight loss or weight gain?  No  Is the patient diabetic?  Yes If diabetic, was a CBG obtained today?  No Did the patient bring in their glucometer from home?  No Comments: Pt monitors CBG's once per week. Denies any financial strains with the device or supplies.   Diabetic  Exams: Diabetic Eye Exam: Overdue for diabetic eye exam. Pt states she is scheduled to be seen by Amy [name] in June. Diabetic Foot Exam: Completed 09/05/17  How often do you need to have someone help you when you read instructions, pamphlets, or other written materials from your doctor or pharmacy?: 1 - Never  Interpreter Needed?: No  Information entered by :: AEversole, LPN  Hospitalizations/ED visits and surgeries occurring within the previous 12 months:  Within the previous 12 months, pt has not been treated by an emergency room clinician. However, within the previous 12 months, pt was admitted on 08/12/17 through 08/15/17 @ [facility] for right upper abdominal pain, choledocholithiasis and UTI with hematuria, treated by Amy [name]. Pt was not seen by PCP in follow up.   Pt also underwent ERCP with Amy [name] @ [facility] on 08/19/17. Was seen in f/u by Amy [name] on 08/25/17. Amy [name]'s plan included the following:  Assessment & Plan  1. Hospital discharge follow-up - medication rec completed   2. COPD, moderate ([place]) - Encourage inhaler use - Ambulatory referral to Pulmonology  3. Wheezing - albuterol (PROVENTIL) (2.5 MG/3ML) 0.083% nebulizer solution 2.5 mg  4. Recurrent major depressive disorder ([place]) - continue celexa increase dose, take off seroquel   5. Aortic atherosclerosis (HCC) - on plavix and atorvastatin  - Ambulatory referral to Cardiology  6. Diarrhea - Keep GI follow-up  - Diet information given   7. Late onset Alzheimer's disease with behavioral disturbance - may  add namenda as needed - donepezil (ARICEPT) 10 MG tablet; Take 1 tablet (10 mg total) by mouth at bedtime.  Dispense: 30 tablet; Refill: 3  -Red flags and when to present for emergency care or RTC including fever >101.29F, chest pain, shortness of breath, new/worsening/un-resolving symptoms, reviewed with patient at time of visit. Follow up and care instructions discussed and provided in  AVS. -Reviewed Health Maintenance: updated foot exam  In addition to the above, pt was also seen @ [facility] ED on 09/01/17 for chest pain and anxiety, treated by Amy [name]. Pt was seen in f/u by Amy [name] on 09/05/17. Amy [name] planning included the following:  Assessment & Plan:       Problem List Items Addressed This Visit            Endocrine    Diabetes mellitus type 2, controlled ([place])     Well-controlled now; drinking sugary drinks; stopping that will help will help; foot exam by MD       Relevant Orders    Urine Microalbumin w/creat. ratio    Ambulatory referral to Ophthalmology        Musculoskeletal and Integument    OP (osteoporosis)     Order DEXA scan; encouraged calcium with diet, vit D; fall precautions       Relevant Orders    DG Bone Density        Genitourinary    CKD (chronic kidney disease) stage 4, GFR 15-29 ml/min ([place])     Managed by nephrologist, Amy [name]; avoid NSAIDs           Other    Hyperlipidemia     Limit fatty meats       Relevant Medications    furosemide (LASIX) 20 MG tablet    cholestyramine (QUESTRAN) 4 g packet           Other Visit Diagnoses    Diarrhea, unspecified type    -  Primary   start cholestyramine      Follow up plan: Return in about 3 months (around 12/13/2017) for follow-up visit with Amy [name]; cancel appt 09/13/17.  Past Medical History:  Diagnosis Date  . Anemia    low iron  . Anxiety   . Arthritis   . Bilateral cataracts   . Bronchitis    hx of   . Cancer of kidney, left ([place]) 2017  . Cataract   . Chronic kidney disease   . Depression   . Diabetes mellitus without complication ([place])   . Dry cough   . 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 GERD (gastroesophageal reflux disease)   . Headache   . History of frequent urinary tract infections   . Hyperlipidemia   . Hypertension   . Left renal mass   . Numbness and  tingling    right hand   . Obesity   . Peripheral artery disease ([place])   . Urinary frequency   . Urinary incontinence   . Wheezing    Past Surgical History:  Procedure Laterality Date  . ABDOMINAL HYSTERECTOMY  1978  . CARPAL TUNNEL RELEASE Bilateral   . CHOLECYSTECTOMY    . COLONOSCOPY    . COLONOSCOPY WITH ESOPHAGOGASTRODUODENOSCOPY (EGD)    . ERCP N/A 08/19/2017   Procedure: ENDOSCOPIC RETROGRADE CHOLANGIOPANCREATOGRAPHY (ERCP);  Surgeon: Arta Silence, MD;  Location: [facility] ENDOSCOPY;  Service: Endoscopy;  Laterality: N/A;  . LEG AMPUTATION  above the knee / left   . ROBOT ASSISTED LAPAROSCOPIC NEPHRECTOMY Left 01/15/2015   Procedure: ROBOTIC ASSISTED LAPAROSCOPIC RADICAL NEPHRECTOMY;  Surgeon: Alexis Frock, MD;  Location: [facility] ORS;  Service: Urology;  Laterality: Left;  . stent placement right leg      Family History  Problem Relation Age of Onset  . Cancer Mother        Stomach  . Cancer Father   . Diabetes Brother   . Cancer Brother        oral  . Healthy Son   . Healthy Sister    Social History   Socioeconomic History  . Marital status: Married    Spouse name: [name]  . Number of children: 1  . Years of education: Not on file  . Highest education level: 10th grade  Occupational History  . Occupation: Retired  Scientific laboratory technician  . Financial resource strain: Not hard at all  . Food insecurity:    Worry: Never true    Inability: Never true  . Transportation needs:    Medical: No    Non-medical: No  Tobacco Use  . Smoking status: Former Smoker    Packs/day: 1.50    Years: 51.00    Pack years: 76.50    Types: Cigarettes    Last attempt to quit: 05/24/2004    Years since quitting: 13.3  . Smokeless tobacco: Never Used  . Tobacco comment: smoking cessation materials not required  Substance and Sexual Activity  . Alcohol use: Not Currently  . Drug use: No  . Sexual activity: Not Currently  Lifestyle  . Physical activity:    Days per week: 0 days     Minutes per session: 0 min  . Stress: Very much  Relationships  . Social connections:    Talks on phone: Patient refused    Gets together: Patient refused    Attends religious service: Patient refused    Active member of club or organization: Patient refused    Attends meetings of clubs or organizations: Patient refused    Relationship status: Married  Other Topics Concern  . Not on file  Social History Narrative  . Not on file    Outpatient Encounter Medications as of 09/30/2017  Medication Sig  . acetaminophen (TYLENOL) 500 MG tablet Take 1,000 mg by mouth every 6 (six) hours as needed for mild pain, fever or headache.   . albuterol (PROVENTIL HFA;VENTOLIN HFA) 108 (90 Base) MCG/ACT inhaler Inhale 2 puffs into the lungs every 6 (six) hours as needed for wheezing or shortness of breath.  . AMBULATORY NON FORMULARY MEDICATION Medication Name: Flutter valve use after nebulizer treatment.  [name] atorvastatin (LIPITOR) 20 MG tablet Take 1 tablet (20 mg total) by mouth at bedtime.  . busPIRone (BUSPAR) 10 MG tablet Take 10 mg by mouth 3 (three) times daily.  . cholestyramine (QUESTRAN) 4 g packet Take 1 packet (4 g total) by mouth 2 (two) times daily with a meal.  . citalopram (CELEXA) 20 MG tablet Take 2 tablets (40 mg total) by mouth at bedtime.  . clopidogrel (PLAVIX) 75 MG tablet Take 1 tablet (75 mg total) by mouth daily.  [name] donepezil (ARICEPT) 10 MG tablet Take 1 tablet (10 mg total) by mouth at bedtime.  . furosemide (LASIX) 20 MG tablet Take 20 mg by mouth daily.   [name] gabapentin (NEURONTIN) 300 MG capsule Take 1 capsule (300 mg total) by mouth 3 (three) times daily.  [name] glucose blood test strip Use as  instructed.  [name] HYDROcodone-acetaminophen (NORCO) 7.5-325 MG tablet Take 1 tablet by mouth 3 (three) times daily as needed.  [name] ipratropium-albuterol (DUONEB) 0.5-2.5 (3) MG/3ML SOLN Take 3 mLs by nebulization 3 (three) times daily. J44.9  . metoprolol tartrate (LOPRESSOR) 50 MG tablet Take 1  tablet (50 mg total) by mouth 2 (two) times daily.  . Nebulizers MISC by Does not apply route.  . pantoprazole (PROTONIX) 40 MG tablet Take 1 tablet (40 mg total) by mouth daily.  . ranitidine (ZANTAC) 150 MG capsule Take 1 capsule (150 mg total) by mouth at bedtime.  . [DISCONTINUED] calcitRIOL (ROCALTROL) 0.25 MCG capsule Take 0.25 mcg by mouth 3 (three) times a week.  . [DISCONTINUED] clindamycin (CLEOCIN) 300 MG capsule    No facility-administered encounter medications on file as of 09/30/2017.     Activities of Daily Living In your present state of health, do you have any difficulty performing the following activities: 09/30/2017 09/01/2017  Hearing? N Y  Comment ringing in the ears -  Vision? N N  Comment wears eyeglasses, s/p cataract removal -  Difficulty concentrating or making decisions? [name]  Walking or climbing stairs? Y Y  Comment N/A; L above knee amputation -  Dressing or bathing? [name]  Comment spouse helps with peri care -  Doing errands, shopping? [name]  Comment spouse takes care of running errands -  Conservation officer, nature and eating ? Y -  Comment full upper and partial lower dentures; spouse prepares meals -  Using the Toilet? Y -  Comment spouse helps to get to the BR -  In the past six months, have you accidently leaked urine? N -  Do you have problems with loss of bowel control? N -  Managing your Medications? Y -  Comment spouse manages medications -  Managing your Finances? Y -  Comment spouse manages finances -  Housekeeping or managing your Housekeeping? Y -  Comment spouse manages home -  Some recent data might be hidden    Patient Care Team: Lada, Satira Anis, MD as PCP - General (Family Medicine) Lequita Asal, MD as Referring Physician (Hematology and Oncology) Lang Snow, [name] as Nurse Practitioner (Neurology) Birder Robson, MD as Referring Physician (Ophthalmology) Lucky Cowboy Erskine Squibb, MD as Referring Physician (Vascular Surgery) Dagoberto Ligas, MD as Referring Physician (Pain Medicine) Lavonia Dana, MD as Consulting Physician (Nephrology) Virgel Manifold, MD as Consulting Physician (Gastroenterology) Yolonda Kida, MD as Consulting Physician (Cardiology)    Assessment:   This is a routine wellness examination for Powell.  Exercise Activities and Dietary recommendations Current Exercise Habits: The patient does not participate in regular exercise at present, Exercise limited by: Other - see comments(L above knee amputation; poor motivation and spousal dependence)  Goals    . DIET - INCREASE WATER INTAKE     Recommend to drink at least 6-8 8oz glasses of water per day.       Fall Risk Fall Risk  09/30/2017 09/15/2017 09/01/2017 08/31/2017 08/11/2017  Falls in the past year? Exclusion - non ambulatory Yes Yes Yes Yes  Comment L above knee amputation and does not use prosthesis for ambulation. Declined my offer to refer for prosthetic adjustment to ensure proper fit - - - -  Number falls in past yr: - 1 2 or more 2 or more 2 or more  Injury with Fall? - No No No No  Risk Factor Category  - - - - -  Risk for fall  due to : - - - History of fall(s) -  Follow up - - - Falls prevention discussed;Education provided -   FALL RISK PREVENTION PERTAINING TO HOME: Is your home free of loose throw rugs in walkways, pet beds, electrical cords, etc? Yes Is there adequate lighting in your home to reduce risk of falls?  Yes Are there stairs in or around your home WITH handrails? Utilizes ramp for entry in to home  [place]: Use of a cane, walker or w/c? Yes, uses w/c. Denies use of prothesis. States it no longer fits. Offered for me to place a referral for prosthesis adjustment. Pt declined my offer. Grab bars in the bathroom? Yes  Shower chair or a place to sit while bathing? Yes An elevated toilet seat or a handicapped toilet? Yes  Timed Get Up and Go Performed: N/A  Community  Resource Referral:  [place] Referral not required at this time.   Depression Screen PHQ 2/9 Scores 09/30/2017 09/15/2017 09/01/2017 08/31/2017  PHQ - 2 Score 2 0 2 1  PHQ- 9 Score 16 - - 8  Exception Documentation - - - -  Not completed - - - -     Cognitive Function     6CIT Screen 09/30/2017 08/11/2017 09/13/2016  What Year? 0 points 0 points 0 points  What month? 0 points 0 points 0 points  What time? 3 points 3 points 3 points  Count back from 20 0 points 0 points 0 points  Months in reverse 4 points 4 points 4 points  Repeat phrase 6 points 6 points 4 points  Total Score 13 13 11     Immunization History  Administered Date(s) Administered  . Influenza, High Dose Seasonal PF 01/29/2015, 02/23/2016, 03/23/2017  . Influenza, Seasonal, Injecte, Preservative Fre 02/03/2012  . Influenza,inj,Quad PF,6+ Mos 01/29/2013, 02/11/2014  . Influenza-Unspecified 02/11/2014, 03/07/2017  . Pneumococcal Conjugate-13 04/25/2014  . Pneumococcal Polysaccharide-23 05/24/2008  . Zoster 11/19/2008    Qualifies for Shingles Vaccine? Yes. Zostavax completed 11/19/08. Due for Shingrix. Education has been provided regarding the importance of this vaccine. Pt has been advised to call her insurance company to determine her out of pocket expense. Advised she may also receive this vaccine at her local pharmacy or Health Dept. Verbalized acceptance and understanding.  Due for Tdap vaccine. Education has been provided regarding the importance of this vaccine. Pt has been advised she may receive this vaccine at her local pharmacy or Health Dept. Also advised to provide a copy of her vaccination record if she chooses to receive this vaccine at her local pharmacy. Verbalized acceptance and understanding.  Screening Tests Health Maintenance  Topic Date Due  . OPHTHALMOLOGY EXAM  01/01/1946  . URINE MICROALBUMIN  08/26/2016  . DEXA SCAN  05/24/2026 (Originally 01/01/2003)  . TETANUS/TDAP  05/24/2026  (Originally 12/31/1956)  . HEMOGLOBIN A1C  12/13/2017  . INFLUENZA VACCINE  12/22/2017  . FOOT EXAM  09/06/2018  . PNA vac Low Risk Adult  Completed    Cancer Screenings: Lung: Low Dose CT Chest recommended if Age 74-80 years, 30 pack-year currently smoking OR have quit w/in 15years. Patient does not qualify. Breast Screening: No longer required Bone Density/Dexa: Amy [name] ordered this screening on 09/05/17. At the time of this entry, pt has not yet scheduled an appt. Provided pt and her spouse with contact information and advised to schedule appt. Verbalized acceptance and understanding. Colorectal: No longer required  Additional Screenings: Hepatitis C Screening: Does not qualify  Plan:  I have personally reviewed and addressed the Medicare Annual Wellness questionnaire and have noted the following in the patient's chart:  A. Medical and social history B. Use of alcohol, tobacco or illicit drugs  C. Current medications and supplements D. Functional ability and status E.  Nutritional status F.  Physical activity G. Advance directives H. List of other physicians I.  Hospitalizations, surgeries, and ER visits in previous 12 months J.  [place] such as hearing and vision if needed, cognitive and depression L. Referrals and appointments  In addition, I have reviewed and discussed with patient certain preventive protocols, quality metrics, and best practice recommendations. A written personalized care plan for preventive services as well as general preventive health recommendations were provided to patient.  See attached scanned questionnaire for additional information.   Signed,  Aleatha Borer, LPN Nurse Health Advisor

## 2017-09-30 NOTE — Patient Instructions (Signed)
Amy [name] , Thank you for taking time to come for your Medicare Wellness Visit. I appreciate your ongoing commitment to your health goals. Please review the following plan we discussed and let me know if I can assist you in the future.   Screening recommendations/referrals: Colorectal Screening: No longer required Mammogram: No longer required Bone Density: No longer required Lung Cancer Screening: No longer required Hepatitis C Screening: You do not qualify for this screening  Vision and Dental Exams: Recommended annual ophthalmology exams for early detection of glaucoma and other disorders of the eye Recommended annual dental exams for proper oral hygiene  Vaccinations: Influenza vaccine: Up to date Pneumococcal vaccine: Completed series Tdap vaccine: Declined. Please call your insurance company to determine your out of pocket expense. You may also receive this vaccine at your local pharmacy or Health Dept. Shingles vaccine: Please call your insurance company to determine your out of pocket expense for the Shingrix vaccine. You may also receive this vaccine at your local pharmacy or Health Dept.    Advanced directives: Advance directive discussed with you today. I have provided a copy for you to complete at home and have notarized. Once this is complete please bring a copy in to our office so we can scan it into your chart.  Conditions/risks identified: Recommend to drink at least 6-8 8oz glasses of water per day.  Next appointment: Please schedule your Annual Wellness Visit with your Nurse Health Advisor in one year.  Please schedule an appointment with Dr. Sanda Klein within the next 30 days for follow up after today's Annual Wellness Visit.  Preventive Care 80 Years and Older, Female Preventive care refers to lifestyle choices and visits with your health care provider that can promote health and wellness. What does preventive care include?  A yearly physical exam. This is also called  an annual well check.  Dental exams once or twice a year.  Routine eye exams. Ask your health care provider how often you should have your eyes checked.  Personal lifestyle choices, including:  Daily care of your teeth and gums.  Regular physical activity.  Eating a healthy diet.  Avoiding tobacco and drug use.  Limiting alcohol use.  Practicing safe sex.  Taking low-dose aspirin every day.  Taking vitamin and mineral supplements as recommended by your health care provider. What happens during an annual well check? The services and screenings done by your health care provider during your annual well check will depend on your age, overall health, lifestyle risk factors, and family history of disease. Counseling  Your health care provider may ask you questions about your:  Alcohol use.  Tobacco use.  Drug use.  Emotional well-being.  Home and relationship well-being.  Sexual activity.  Eating habits.  History of falls.  Memory and ability to understand (cognition).  Work and work Statistician.  Reproductive health. Screening  You may have the following tests or measurements:  Height, weight, and BMI.  Blood pressure.  Lipid and cholesterol levels. These may be checked every 5 years, or more frequently if you are over 65 years old.  Skin check.  Lung cancer screening. You may have this screening every year starting at age 67 if you have a 30-pack-year history of smoking and currently smoke or have quit within the past 15 years.  Fecal occult blood test (FOBT) of the stool. You may have this test every year starting at age 71.  Flexible sigmoidoscopy or colonoscopy. You may have a sigmoidoscopy every 5 years  or a colonoscopy every 10 years starting at age 79.  Hepatitis C blood test.  Hepatitis B blood test.  Sexually transmitted disease (STD) testing.  Diabetes screening. This is done by checking your blood sugar (glucose) after you have not eaten  for a while (fasting). You may have this done every 1-3 years.  Bone density scan. This is done to screen for osteoporosis. You may have this done starting at age 45.  Mammogram. This may be done every 1-2 years. Talk to your health care provider about how often you should have regular mammograms. Talk with your health care provider about your test results, treatment options, and if necessary, the need for more tests. Vaccines  Your health care provider may recommend certain vaccines, such as:  Influenza vaccine. This is recommended every year.  Tetanus, diphtheria, and acellular pertussis (Tdap, Td) vaccine. You may need a Td booster every 10 years.  Zoster vaccine. You may need this after age 13.  Pneumococcal 13-valent conjugate (PCV13) vaccine. One dose is recommended after age 34.  Pneumococcal polysaccharide (PPSV23) vaccine. One dose is recommended after age 37. Talk to your health care provider about which screenings and vaccines you need and how often you need them. This information is not intended to replace advice given to you by your health care provider. Make sure you discuss any questions you have with your health care provider. Document Released: 06/06/2015 Document Revised: 01/28/2016 Document Reviewed: 03/11/2015 Elsevier Interactive Patient Education  2017 [place] Prevention in the Home Falls can cause injuries. They can happen to people of all ages. There are many things you can do to make your home safe and to help prevent falls. What can I do on the outside of my home?  Regularly fix the edges of walkways and driveways and fix any cracks.  Remove anything that might make you trip as you walk through a door, such as a raised step or threshold.  Trim any bushes or trees on the path to your home.  Use bright outdoor lighting.  Clear any walking paths of anything that might make someone trip, such as rocks or tools.  Regularly check to see if  handrails are loose or broken. Make sure that both sides of any steps have handrails.  Any raised decks and porches should have guardrails on the edges.  Have any leaves, snow, or ice cleared regularly.  Use sand or salt on walking paths during winter.  Clean up any spills in your garage right away. This includes oil or grease spills. What can I do in the bathroom?  Use night lights.  Install grab bars by the toilet and in the tub and shower. Do not use towel bars as grab bars.  Use non-skid mats or decals in the tub or shower.  If you need to sit down in the shower, use a plastic, non-slip stool.  Keep the floor dry. Clean up any water that spills on the floor as soon as it happens.  Remove soap buildup in the tub or shower regularly.  Attach bath mats securely with double-sided non-slip rug tape.  Do not have throw rugs and other things on the floor that can make you trip. What can I do in the bedroom?  Use night lights.  Make sure that you have a light by your bed that is easy to reach.  Do not use any sheets or blankets that are too big for your bed. They should not hang down  onto the floor.  Have a firm chair that has side arms. You can use this for support while you get dressed.  Do not have throw rugs and other things on the floor that can make you trip. What can I do in the kitchen?  Clean up any spills right away.  Avoid walking on wet floors.  Keep items that you use a lot in easy-to-reach places.  If you need to reach something above you, use a strong step stool that has a grab bar.  Keep electrical cords out of the way.  Do not use floor polish or wax that makes floors slippery. If you must use wax, use non-skid floor wax.  Do not have throw rugs and other things on the floor that can make you trip. What can I do with my stairs?  Do not leave any items on the stairs.  Make sure that there are handrails on both sides of the stairs and use them. Fix  handrails that are broken or loose. Make sure that handrails are as long as the stairways.  Check any carpeting to make sure that it is firmly attached to the stairs. Fix any carpet that is loose or worn.  Avoid having throw rugs at the top or bottom of the stairs. If you do have throw rugs, attach them to the floor with carpet tape.  Make sure that you have a light switch at the top of the stairs and the bottom of the stairs. If you do not have them, ask someone to add them for you. What else can I do to help prevent falls?  Wear shoes that:  Do not have high heels.  Have rubber bottoms.  Are comfortable and fit you well.  Are closed at the toe. Do not wear sandals.  If you use a stepladder:  Make sure that it is fully opened. Do not climb a closed stepladder.  Make sure that both sides of the stepladder are locked into place.  Ask someone to hold it for you, if possible.  Clearly mark and make sure that you can see:  Any grab bars or handrails.  First and last steps.  Where the edge of each step is.  Use tools that help you move around (mobility aids) if they are needed. These include:  Canes.  Walkers.  Scooters.  Crutches.  Turn on the lights when you go into a dark area. Replace any light bulbs as soon as they burn out.  Set up your furniture so you have a clear path. Avoid moving your furniture around.  If any of your floors are uneven, fix them.  If there are any pets around you, be aware of where they are.  Review your medicines with your doctor. Some medicines can make you feel dizzy. This can increase your chance of falling. Ask your doctor what other things that you can do to help prevent falls. This information is not intended to replace advice given to you by your health care provider. Make sure you discuss any questions you have with your health care provider. Document Released: 03/06/2009 Document Revised: 10/16/2015 Document Reviewed:  06/14/2014 Elsevier Interactive Patient Education  2017 [employer].

## 2017-10-04 ENCOUNTER — Other Ambulatory Visit: Payer: Self-pay | Admitting: Family Medicine

## 2017-10-04 DIAGNOSIS — E781 Pure hyperglyceridemia: Secondary | ICD-10-CM

## 2017-10-04 MED ORDER — ATORVASTATIN CALCIUM 20 MG PO TABS: 20.0000 mg | ORAL_TABLET | Freq: Every day | ORAL | 0 refills | Status: DC

## 2017-10-04 NOTE — Telephone Encounter (Signed)
Copied from [place] #[id number]. Topic: Quick Communication - See Telephone Encounter >> Oct 04, 2017  8:55 AM Ether Griffins B wrote: CRM for notification. See Telephone encounter for: 10/04/17.  Pts husband calling stating [facility] contacted them letting them know the office hasn't responded to the request to refill the medication. He knows one is atorvastatin she is really needing refilled. But he is not sure what the others is.

## 2017-10-04 NOTE — Telephone Encounter (Signed)
I refilled the atorvastatin I will be happy to consider refilling others if you will tell me what she needs

## 2017-10-05 ENCOUNTER — Ambulatory Visit
Admission: RE | Admit: 2017-10-05 | Discharge: 2017-10-05 | Disposition: A | Discharge status: Home / Self Care | Payer: Medicare HMO | Source: Ambulatory Visit | Attending: Family Medicine | Admitting: Family Medicine

## 2017-10-05 ENCOUNTER — Encounter: Payer: Self-pay | Admitting: Family Medicine

## 2017-10-05 DIAGNOSIS — M8589 Other specified disorders of bone density and structure, multiple sites: Secondary | ICD-10-CM | POA: Diagnosis not present

## 2017-10-05 DIAGNOSIS — M8580 Other specified disorders of bone density and structure, unspecified site: Secondary | ICD-10-CM

## 2017-10-05 DIAGNOSIS — Z780 Asymptomatic menopausal state: Secondary | ICD-10-CM | POA: Diagnosis not present

## 2017-10-05 DIAGNOSIS — M810 Age-related osteoporosis without current pathological fracture: Secondary | ICD-10-CM | POA: Insufficient documentation

## 2017-10-05 HISTORY — DX: Other specified disorders of bone density and structure, unspecified site: M85.80

## 2017-10-06 ENCOUNTER — Other Ambulatory Visit: Payer: Self-pay

## 2017-10-06 DIAGNOSIS — G301 Alzheimer's disease with late onset: Principal | ICD-10-CM

## 2017-10-06 DIAGNOSIS — F3342 Major depressive disorder, recurrent, in full remission: Secondary | ICD-10-CM

## 2017-10-06 DIAGNOSIS — F0281 Dementia in other diseases classified elsewhere with behavioral disturbance: Secondary | ICD-10-CM

## 2017-10-06 DIAGNOSIS — F02818 Dementia in other diseases classified elsewhere, unspecified severity, with other behavioral disturbance: Secondary | ICD-10-CM

## 2017-10-06 MED ORDER — DONEPEZIL HCL 10 MG PO TABS: 10.0000 mg | ORAL_TABLET | Freq: Every day | ORAL | 3 refills | Status: DC

## 2017-10-06 MED ORDER — CITALOPRAM HYDROBROMIDE 20 MG PO TABS: 40.0000 mg | ORAL_TABLET | Freq: Every day | ORAL | 0 refills | Status: DC

## 2017-10-06 NOTE — Telephone Encounter (Signed)
Needs 90 day

## 2017-10-07 DIAGNOSIS — J42 Unspecified chronic bronchitis: Secondary | ICD-10-CM | POA: Diagnosis not present

## 2017-10-07 DIAGNOSIS — S31125A Laceration of abdominal wall with foreign body, periumbilic region without penetration into peritoneal cavity, initial encounter: Secondary | ICD-10-CM | POA: Diagnosis not present

## 2017-10-07 DIAGNOSIS — Z89612 Acquired absence of left leg above knee: Secondary | ICD-10-CM | POA: Diagnosis not present

## 2017-10-12 ENCOUNTER — Other Ambulatory Visit: Payer: Self-pay

## 2017-10-12 DIAGNOSIS — F3342 Major depressive disorder, recurrent, in full remission: Secondary | ICD-10-CM

## 2017-10-12 NOTE — Telephone Encounter (Signed)
Pharmacy called needs clarification on rx.   Is his supposed to be 40mg  bid or 20mg  bid?  Also they state standard dose in elderly pt is 10mg 

## 2017-10-13 MED ORDER — CITALOPRAM HYDROBROMIDE 20 MG PO TABS: 20.0000 mg | ORAL_TABLET | Freq: Every day | ORAL | 0 refills | Status: DC

## 2017-10-13 MED ORDER — CITALOPRAM HYDROBROMIDE 10 MG PO TABS: 10.0000 mg | ORAL_TABLET | Freq: Every day | ORAL | 0 refills | Status: DC

## 2017-10-13 NOTE — Telephone Encounter (Signed)
Thank you Let's have her take 20 mg daily for one week (Rx sent locally), and that will allow time for the 10 mg to arrive (hopefully) Call us back with any problems

## 2017-10-13 NOTE — Telephone Encounter (Signed)
Pt husband notified, but states she is completely out of the medication? What would you like for the to do?  Would you do a 7 day supply to a local pharmacy until they can get mail order

## 2017-10-13 NOTE — Telephone Encounter (Signed)
Left detailed voicmeial

## 2017-10-13 NOTE — Telephone Encounter (Signed)
I do not see anywhere ever that we would have written for 40 mg BID I agree with changing to 10 mg daily Have patient take 20 mg daily for five days, then 10 mg daily from then on

## 2017-10-19 ENCOUNTER — Ambulatory Visit: Payer: Self-pay | Admitting: *Deleted

## 2017-10-21 DIAGNOSIS — N2581 Secondary hyperparathyroidism of renal origin: Secondary | ICD-10-CM | POA: Diagnosis not present

## 2017-10-21 DIAGNOSIS — N183 Chronic kidney disease, stage 3 (moderate): Secondary | ICD-10-CM | POA: Diagnosis not present

## 2017-10-21 DIAGNOSIS — R809 Proteinuria, unspecified: Secondary | ICD-10-CM | POA: Diagnosis not present

## 2017-10-21 DIAGNOSIS — D631 Anemia in chronic kidney disease: Secondary | ICD-10-CM | POA: Diagnosis not present

## 2017-10-21 DIAGNOSIS — I129 Hypertensive chronic kidney disease with stage 1 through stage 4 chronic kidney disease, or unspecified chronic kidney disease: Secondary | ICD-10-CM | POA: Diagnosis not present

## 2017-10-24 ENCOUNTER — Other Ambulatory Visit: Payer: Self-pay | Admitting: *Deleted

## 2017-10-24 NOTE — Patient Outreach (Addendum)
[facility]) Care Management  10/24/2017  Amy [name] Aug 01, 1935 [id number]   Patient history includes : Diabetes, COPD,Alzheimer's,chronicpain syndrome, s/p left AKA,excessive falling , hx renal cancer Recent hospital admission at [facility] 3/22- 3/25 Dx: RUQ pain , Choledocholithiasis, UTI ED visit on 4/11 for chest pain.  Successful telephone outreach call to patient husband HIPAA verified. Mr.Single reports he feels things are going much better at home, He states it is much better since patient is not having the frequent bowel movements he is able to manage her better.  He discussed she at times has difficulty sleeping at night and imagines hearing things such a child being mistreated, patient also naps frequently during the day. Patient has appointment with Dementia doctor on this week and he will discuss with her.   Mr. Fellows reports patient is tolerating her breathing treatment better since using the nebulizer machine. He denies patient reports having complaint of chest pain or shortness of breath.   Spouse reports no further falls at home, he denies any new concerns at this time, states that he is managing well at home with patient care .He reports continuing to organize and administer patient medications as prescribed and denies having any concerns.  Spouse declined follow up home visit on this month.    Plan  Reviewed patient centered goals progression and spouse agreeable to case closure if no new concerns at next telephone follow up.  Will schedule follow up call in the next 3 weeks.    Joylene Draft, RN, [place] Management Coordinator  [phone]- Mobile [phone]- Toll Free Main Office

## 2017-10-27 ENCOUNTER — Telehealth: Payer: Self-pay | Admitting: Nurse Practitioner

## 2017-10-27 DIAGNOSIS — R9431 Abnormal electrocardiogram [ECG] [EKG]: Secondary | ICD-10-CM

## 2017-10-27 DIAGNOSIS — G309 Alzheimer's disease, unspecified: Secondary | ICD-10-CM | POA: Diagnosis not present

## 2017-10-27 DIAGNOSIS — F418 Other specified anxiety disorders: Secondary | ICD-10-CM | POA: Diagnosis not present

## 2017-10-27 DIAGNOSIS — F0151 Vascular dementia with behavioral disturbance: Secondary | ICD-10-CM | POA: Diagnosis not present

## 2017-10-27 DIAGNOSIS — G546 Phantom limb syndrome with pain: Secondary | ICD-10-CM | POA: Diagnosis not present

## 2017-10-27 DIAGNOSIS — G4719 Other hypersomnia: Secondary | ICD-10-CM | POA: Diagnosis not present

## 2017-10-27 DIAGNOSIS — F5104 Psychophysiologic insomnia: Secondary | ICD-10-CM | POA: Diagnosis not present

## 2017-10-27 NOTE — Telephone Encounter (Signed)
Message will be sent to [facility] and she will call with any questions or concerns

## 2017-10-27 NOTE — Telephone Encounter (Signed)
Spoke to Amy [name]- Neurology at [facility] informed to best manage patients memory and behavioral disturbances plans to restart Seroquel at low dose. Dr. Sanda Klein PCP informed.

## 2017-10-27 NOTE — Telephone Encounter (Signed)
Amy [name], please call Amy [name] and let her know that the last two EKGs I reviewed showed her QTc was 500 and 470 msec; I just want her to be aware that I'm concerned about the seroquel

## 2017-10-28 ENCOUNTER — Encounter: Payer: Self-pay | Admitting: Family Medicine

## 2017-10-28 DIAGNOSIS — E119 Type 2 diabetes mellitus without complications: Secondary | ICD-10-CM | POA: Diagnosis not present

## 2017-10-28 LAB — HM DIABETES EYE EXAM

## 2017-11-07 ENCOUNTER — Encounter: Payer: Self-pay | Admitting: *Deleted

## 2017-11-07 ENCOUNTER — Other Ambulatory Visit: Payer: Self-pay | Admitting: *Deleted

## 2017-11-07 DIAGNOSIS — Z89612 Acquired absence of left leg above knee: Secondary | ICD-10-CM | POA: Diagnosis not present

## 2017-11-07 DIAGNOSIS — J42 Unspecified chronic bronchitis: Secondary | ICD-10-CM | POA: Diagnosis not present

## 2017-11-07 DIAGNOSIS — S31125A Laceration of abdominal wall with foreign body, periumbilic region without penetration into peritoneal cavity, initial encounter: Secondary | ICD-10-CM | POA: Diagnosis not present

## 2017-11-07 NOTE — Patient Outreach (Addendum)
[facility]) Care Management  11/07/2017  ELENORE WANNINGER 07-May-1936 [id number]   Patient history includes : Diabetes, COPD,Alzheimer's,chronicpain syndrome, s/p left AKA,excessive falling , hx renal cancer Recent hospital admission at [facility] 3/22- 3/25 Dx: RUQ pain , Choledocholithiasis, UTI ED visit on 4/11 for chest pain  Unsuccessful outreach to patient , busy telephone message received, attempted call x 3.   [phone]  Successful outreach call to patient spouse, contact person, Richarda Osmond, HIPAA verified.  Spouse reports patient is doing pretty good on today, she did have some difficulty 2 nights ago trying to get to sleep but last night was better, he also discussed episode in which patient had difficulty transferring to commode, he states it was like she forgot how they usually do it, but it went better the next time. . Husband discussed recent visit to Neurologist for Dementia follow up . Spouse  reports patient taking medications as prescribed recent medication change to help with sleep disturbances that patient has at times. Spouse discussed they they are managing okay at this time. Discussed that he is aware that patient condition may  get worse but he is handling patient care needs, and denies need for additional assistance. Discussed with spouse if needed additional resource information at this time as previously discussed with [facility] social worker, he declined, made sure patient has [facility] contact information . Spouse reports that he has all of patient medications and able to organize as prescribed.   Spouse report patient episodes of diarrhea is still controlled on daily medications and he is able to manage care better due to this . Spouse denies patient having recurrent falls and he continues to able to manage transferring patient.      Spouse reports patient is doing good with nebulizer treatments, compared to using inhaler, reports he gets about 2  treatments in daily, she uses her flutter valve if reminded.    Discussed with spouse care goals, being met, he denies any new concerns at this time. Discussed with spouse having [facility] care management contact information he verified contact numbers, if new concerns arise. Spouse denies any new concerns at this time.    Plan  Will plan case closure as goal met,  Will send PCP case closure letter.    Joylene Draft, RN, [place] Management Coordinator  [phone]- Mobile [phone]- Toll Free Main Office

## 2017-11-08 ENCOUNTER — Other Ambulatory Visit: Payer: Self-pay

## 2017-11-08 ENCOUNTER — Encounter: Payer: Self-pay | Admitting: Family Medicine

## 2017-11-08 DIAGNOSIS — N2581 Secondary hyperparathyroidism of renal origin: Secondary | ICD-10-CM | POA: Insufficient documentation

## 2017-11-08 DIAGNOSIS — K219 Gastro-esophageal reflux disease without esophagitis: Secondary | ICD-10-CM

## 2017-11-08 DIAGNOSIS — I129 Hypertensive chronic kidney disease with stage 1 through stage 4 chronic kidney disease, or unspecified chronic kidney disease: Secondary | ICD-10-CM | POA: Insufficient documentation

## 2017-11-08 MED ORDER — PANTOPRAZOLE SODIUM 40 MG PO TBEC: 40.0000 mg | DELAYED_RELEASE_TABLET | Freq: Every day | ORAL | 1 refills | Status: DC

## 2017-11-10 ENCOUNTER — Other Ambulatory Visit: Payer: Self-pay | Admitting: Internal Medicine

## 2017-12-02 NOTE — Progress Notes (Signed)
[facility]     Assessment and Plan:  Chronic bronchitis with dyspnea on exertion. -Dyspnea on exertion is likely multifactorial from underlying respiratory disease, as well as deconditioning, obesity.  Patient is wheelchair-bound. - Continue nebs three times daily. Recommended flutter valve.   Dementia. - instructions were given to the patient's husband who not acknowledged understanding.  Lung nodule. -There is slow/minimal growth of a right pleural-based nodule over the last 1 year, currently 7 mm in size. -Patient is currently being followed at the cancer center, can continue surveillance scanning every 6-12 months.    Return in about 6 months (around 06/07/2018).    Date: 12/02/2017  MRN# [id number] Amy [name] 1935/12/18    Amy [name] is a 82 y.o. old female seen in consultation for chief complaint of:    No chief complaint on file.   HPI:  The patient is a 82 yo female, she has dementia, and last visit she was having symptoms of chronic bronchitis, excess mucus secretions, dyspnea on exertion.  She was started on nebulized duo nebs to be used in combination with flutter valve 3 times daily. Patient has significant difficulty understanding instructions, and providing history.  Therefore history is obtained from the chart, and from staff.  Since her last visit the breathing has been doing her nebs 2-3 times per day but stopped using the flutter valve because she did not like it.   She has a lung nodule, she is followed by cancer center in [place].   They have an albuterol inhaler currently but she is not using it correctly so stopped using it.   Images personally reviewed, CT chest 08/12/17 in comparison with previous 02/16/17 there is a tiny right pleural  based anterior nodule increased from 6.6 mm to 7.2 mm, though appears grossly unchanged. Previous CT 08/16/16 showed 6 mm nodule.   Medication:    Current Outpatient Medications:  .   acetaminophen (TYLENOL) 500 MG tablet, Take 1,000 mg by mouth every 6 (six) hours as needed for mild pain, fever or headache. , Disp: , Rfl:  .  albuterol (PROVENTIL HFA;VENTOLIN HFA) 108 (90 Base) MCG/ACT inhaler, Inhale 2 puffs into the lungs every 6 (six) hours as needed for wheezing or shortness of breath., Disp: 1 Inhaler, Rfl: 2 .  AMBULATORY NON FORMULARY MEDICATION, Medication Name: Flutter valve use after nebulizer treatment., Disp: 1 each, Rfl: 0 .  atorvastatin (LIPITOR) 20 MG tablet, Take 1 tablet (20 mg total) by mouth at bedtime., Disp: 90 tablet, Rfl: 0 .  busPIRone (BUSPAR) 10 MG tablet, Take 10 mg by mouth 3 (three) times daily., Disp: , Rfl:  .  cholestyramine (QUESTRAN) 4 g packet, Take 1 packet (4 g total) by mouth 2 (two) times daily with a meal., Disp: 60 each, Rfl: 11 .  citalopram (CELEXA) 10 MG tablet, Take 1 tablet (10 mg total) by mouth daily., Disp: 90 tablet, Rfl: 0 .  citalopram (CELEXA) 20 MG tablet, Take 1 tablet (20 mg total) by mouth daily. For seven days, then start 10 mg when it arrives, Disp: 7 tablet, Rfl: 0 .  clopidogrel (PLAVIX) 75 MG tablet, Take 1 tablet (75 mg total) by mouth daily., Disp: 90 tablet, Rfl: 3 .  donepezil (ARICEPT) 10 MG tablet, Take 1 tablet (10 mg total) by mouth at bedtime., Disp: 90 tablet, Rfl: 3 .  furosemide (LASIX) 20 MG tablet, Take 20 mg by mouth daily. , Disp: , Rfl:  .  gabapentin (NEURONTIN) 300  MG capsule, Take 1 capsule (300 mg total) by mouth 3 (three) times daily., Disp: 270 capsule, Rfl: 3 .  glucose blood test strip, Use as instructed., Disp: 100 each, Rfl: 12 .  HYDROcodone-acetaminophen (NORCO) 7.5-325 MG tablet, Take 1 tablet by mouth 3 (three) times daily as needed., Disp: 90 tablet, Rfl: 0 .  ipratropium-albuterol (DUONEB) 0.5-2.5 (3) MG/3ML SOLN, INHALE THE CONTENTS OF 1 VIAL VIA NEBULIZER THREE TIMES DAILY AS DIRECTED, Disp: 810 mL, Rfl: 2 .  metoprolol tartrate (LOPRESSOR) 50 MG tablet, Take 1 tablet (50 mg total)  by mouth 2 (two) times daily., Disp: 180 tablet, Rfl: 0 .  Nebulizers MISC, by Does not apply route., Disp: , Rfl:  .  pantoprazole (PROTONIX) 40 MG tablet, Take 1 tablet (40 mg total) by mouth daily., Disp: 90 tablet, Rfl: 1 .  ranitidine (ZANTAC) 150 MG capsule, Take 1 capsule (150 mg total) by mouth at bedtime., Disp: 90 capsule, Rfl: 3   Allergies:  Fentanyl and Penicillins  Review of Systems:  Constitutional: Feels well. Cardiovascular: No chest pain.  Pulmonary: Denies dyspnea.   The remainder of systems were reviewed and were found to be negative other than what is documented in the HPI.    Physical Examination:   VS: BP 128/82 (BP Location: Left Arm, Cuff Size: Large)   Pulse 62   Resp 16   Ht 5\' 2"  (1.575 m)   SpO2 94%   BMI 33.65 kg/m   General Appearance: No distress  Neuro:without focal findings, mental status, speech normal, alert and oriented HEENT: PERRLA, EOM intact Pulmonary: No wheezing, No rales  CardiovascularNormal S1,S2.  No m/r/g.  Abdomen: Benign, Soft, non-tender, No masses Renal:  No costovertebral tenderness  GU:  No performed at this time. Endoc: No evident thyromegaly, no signs of acromegaly or Cushing features Skin:   warm, no rashes, no ecchymosis  Extremities: normal, no cyanosis, clubbing. Left left amputation.       LABORATORY PANEL:   CBC No results for input(s): WBC, HGB, HCT, PLT in the last 168 hours. ------------------------------------------------------------------------------------------------------------------  Chemistries  No results for input(s): NA, K, CL, CO2, GLUCOSE, BUN, CREATININE, CALCIUM, MG, AST, ALT, ALKPHOS, BILITOT in the last 168 hours.  Invalid input(s): GFRCGP ------------------------------------------------------------------------------------------------------------------  Cardiac Enzymes No results for input(s): TROPONINI in the last 168  hours. ------------------------------------------------------------  RADIOLOGY:  No results found.     Thank  you for the consultation and for allowing [facility] to assist in the care of your patient. Our recommendations are noted above.  Please contact us if we can be of further service.   Marda Stalker, M.D., F.C.C.P.  Board Certified in Internal Medicine, Pulmonary Medicine, [place], and Sleep Medicine.  [facility] Office Number: [phone]  12/02/2017

## 2017-12-05 ENCOUNTER — Ambulatory Visit: Payer: Medicare HMO | Admitting: Internal Medicine

## 2017-12-05 ENCOUNTER — Encounter: Payer: Self-pay | Admitting: Internal Medicine

## 2017-12-05 VITALS — BP 128/82 | HR 62 | Resp 16 | Ht 62.0 in

## 2017-12-05 DIAGNOSIS — J42 Unspecified chronic bronchitis: Secondary | ICD-10-CM

## 2017-12-05 NOTE — Patient Instructions (Signed)
Continue nebulizer three times per day, followed by flutter valve after each treatment.

## 2017-12-07 ENCOUNTER — Other Ambulatory Visit: Payer: Self-pay | Admitting: Family Medicine

## 2017-12-07 DIAGNOSIS — Z89612 Acquired absence of left leg above knee: Secondary | ICD-10-CM | POA: Diagnosis not present

## 2017-12-07 DIAGNOSIS — E781 Pure hyperglyceridemia: Secondary | ICD-10-CM

## 2017-12-07 DIAGNOSIS — S31125A Laceration of abdominal wall with foreign body, periumbilic region without penetration into peritoneal cavity, initial encounter: Secondary | ICD-10-CM | POA: Diagnosis not present

## 2017-12-07 DIAGNOSIS — J42 Unspecified chronic bronchitis: Secondary | ICD-10-CM | POA: Diagnosis not present

## 2017-12-08 ENCOUNTER — Ambulatory Visit (INDEPENDENT_AMBULATORY_CARE_PROVIDER_SITE_OTHER): Payer: Medicare HMO | Admitting: Family Medicine

## 2017-12-08 ENCOUNTER — Encounter: Payer: Self-pay | Admitting: Family Medicine

## 2017-12-08 VITALS — BP 132/82 | HR 72 | Temp 98.0°F | Resp 16

## 2017-12-08 DIAGNOSIS — Z5181 Encounter for therapeutic drug level monitoring: Secondary | ICD-10-CM

## 2017-12-08 DIAGNOSIS — R9431 Abnormal electrocardiogram [ECG] [EKG]: Secondary | ICD-10-CM

## 2017-12-08 DIAGNOSIS — R300 Dysuria: Secondary | ICD-10-CM

## 2017-12-08 DIAGNOSIS — Z993 Dependence on wheelchair: Secondary | ICD-10-CM | POA: Diagnosis not present

## 2017-12-08 DIAGNOSIS — R7989 Other specified abnormal findings of blood chemistry: Secondary | ICD-10-CM | POA: Diagnosis not present

## 2017-12-08 DIAGNOSIS — Z89612 Acquired absence of left leg above knee: Secondary | ICD-10-CM

## 2017-12-08 MED ORDER — CEPHALEXIN 500 MG PO CAPS: 500.0000 mg | ORAL_CAPSULE | Freq: Four times a day (QID) | ORAL | 0 refills | Status: DC

## 2017-12-08 NOTE — Progress Notes (Signed)
BP 132/82   Pulse 72   Temp 98 F (36.7 C) (Oral)   Resp 16   SpO2 92%    Subjective:    Patient ID: Amy [name], female    DOB: Sep 21, 1935, 82 y.o.   MRN: [id number]  HPI: Amy [name] is a 82 y.o. female  Chief Complaint  Patient presents with  . Follow-up    neurologist recomended EKG  . Urinary Tract Infection    memory issues about mom, sister and brother    HPI Patient is here for an EKG Amy Falco, NP (neurologist) adjusted her medicine and we are checking her QTc on the change She is taking 50 mg of Seroquel, now down to half the dose of what it was Sleeping okay on this dose Dementia; patient thinks it is her step father with her today, not her husband She is having some burning with urination; she is acting like she has a bladder infection, her husband says Change in mental status; asked him already 20 x the same question today No fever Not drinking water very much; mostly Pepsi; no tea; no coffee Husband is caring for her around the clock; he does not think 2 hours is much help; she might sleep 3-4 hours during the day; sleep schedule varies, she gets enough sleep he says; husband would like a "live in maid"  Depression screen [facility] 2/9 12/08/2017 09/30/2017 09/15/2017 09/01/2017 08/31/2017  Decreased Interest 0 1 0 0 -  Down, Depressed, Hopeless 1 1 0 2 1  PHQ - 2 Score 1 2 0 2 1  Altered sleeping - 3 - - 1  Tired, decreased energy - 3 - - 2  Change in appetite - 2 - - 0  Feeling bad or failure about yourself  - 3 - - 3  Trouble concentrating - 0 - - 1  Moving slowly or fidgety/restless - 3 - - 0  Suicidal thoughts - 0 - - 0  PHQ-9 Score - 16 - - 8  Difficult doing work/chores - Not difficult at all - - Somewhat difficult  Some recent data might be hidden    Relevant past medical, surgical, family and social history reviewed Past Medical History:  Diagnosis Date  . Anemia    low iron  . Anxiety   . Arthritis   . Bilateral cataracts     . Bronchitis    hx of   . Cataract   . Chronic kidney disease   . Depression   . Diabetes mellitus without complication ([place])   . 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 GERD (gastroesophageal reflux disease)   . Headache   . History of frequent urinary tract infections   . Hx of renal cell cancer    LEFT  . Hyperlipidemia   . Hypertension   . Numbness and tingling    right hand   . Obesity   . Osteopenia 10/05/2017  . Peripheral artery disease ([place])   . Urinary frequency   . Urinary incontinence    Past Surgical History:  Procedure Laterality Date  . ABDOMINAL HYSTERECTOMY  1978  . CARPAL TUNNEL RELEASE Bilateral   . CHOLECYSTECTOMY    . COLONOSCOPY    . COLONOSCOPY WITH ESOPHAGOGASTRODUODENOSCOPY (EGD)    . ERCP N/A 08/19/2017   Procedure: ENDOSCOPIC RETROGRADE CHOLANGIOPANCREATOGRAPHY (ERCP);  Surgeon: Arta Silence, MD;  Location: [facility] ENDOSCOPY;  Service: Endoscopy;  Laterality: N/A;  .  LEG AMPUTATION     above the knee / left   . ROBOT ASSISTED LAPAROSCOPIC NEPHRECTOMY Left 01/15/2015   Procedure: ROBOTIC ASSISTED LAPAROSCOPIC RADICAL NEPHRECTOMY;  Surgeon: Alexis Frock, MD;  Location: [facility] ORS;  Service: Urology;  Laterality: Left;  . stent placement right leg      Family History  Problem Relation Age of Onset  . Cancer Mother        Stomach  . Cancer Father   . Diabetes Brother   . Cancer Brother        oral  . Healthy Son   . Healthy Sister    Social History   Tobacco Use  . Smoking status: Former Smoker    Packs/day: 1.50    Years: 51.00    Pack years: 76.50    Types: Cigarettes    Last attempt to quit: 05/24/2004    Years since quitting: 13.5  . Smokeless tobacco: Never Used  . Tobacco comment: smoking cessation materials not required  Substance Use Topics  . Alcohol use: Not Currently  . Drug use: No    Interim medical history since last visit reviewed. Allergies and medications reviewed  Review  of Systems Per HPI unless specifically indicated above     Objective:    BP 132/82   Pulse 72   Temp 98 F (36.7 C) (Oral)   Resp 16   SpO2 92%     Physical Exam  Constitutional: She appears well-developed and well-nourished.  HENT:  Mouth/Throat: Mucous membranes are normal.  Eyes: EOM are normal. No scleral icterus.  Cardiovascular: Normal rate and regular rhythm.  Pulmonary/Chest: Effort normal and breath sounds normal.  Abdominal: Soft. Normal appearance. There is no tenderness.  Musculoskeletal:       Left upper leg: She exhibits deformity (LEFT above the knee amputation).  Psychiatric: Her behavior is normal. Her mood appears not anxious. Her affect is inappropriate. Her affect is not angry. Cognition and memory are impaired. She does not exhibit a depressed mood.  Confused, thinks husband is her stepfather; pleasant; did use two curse words in casual conversation, "pardon my [place]" she said after the first curse word    Results for orders placed or performed in visit on 10/28/17  HM DIABETES EYE EXAM  Result Value Ref Range   HM Diabetic Eye Exam No Retinopathy No Retinopathy      Assessment & Plan:   Problem List Items Addressed This Visit      Other   Wheelchair dependent   Relevant Orders   Ambulatory referral to Connected Care   S/P AKA (above knee amputation) unilateral, left ([place])    Which causes limitations in her ability to perform ADLs and IADLs; referral to C3 care team to see what other assistance may be available; planted seed for patient to consider assisted living or skilled nursing      Relevant Orders   Ambulatory referral to Connected Care   QT prolongation    QTc of 470 on prior EKG; meds adjusted, today's QTc is 430; improvement; avoid other medications which may prolong the QT interval      Elevated serum creatinine    Seen by nephrologist; affected choice of antibitoics; consulted [facility]       Other Visit Diagnoses     Dysuria    -  Primary   attempted to collect urine, supplies given to husband; start antibiotics empirically   Relevant Orders   Urinalysis w microscopic + reflex cultur  Medication monitoring encounter       check EKG with medicine changes; QTc actually improved   Relevant Orders   EKG 12-Lead       Follow up plan: No follow-ups on file.  An after-visit summary was printed and given to the patient at [place].  Please see the patient instructions which may contain other information and recommendations beyond what is mentioned above in the assessment and plan.  Meds ordered this encounter  Medications  . cephALEXin (KEFLEX) 500 MG capsule    Sig: Take 1 capsule (500 mg total) by mouth 4 (four) times daily.    Dispense:  14 capsule    Refill:  0    Orders Placed This Encounter  Procedures  . Urinalysis w microscopic + reflex cultur  . Ambulatory referral to Connected Care  . EKG 12-Lead

## 2017-12-08 NOTE — Patient Instructions (Addendum)
Start the antibiotic Please do eat yogurt or kimchi or take a probiotic daily for the next month We want to replace the healthy germs in the gut If you notice foul, watery diarrhea in the next two months, schedule an appointment RIGHT AWAY or go to an urgent care or the emergency room if a holiday or over a weekend We'll have someone contact you about home needs

## 2017-12-08 NOTE — Assessment & Plan Note (Signed)
QTc of 470 on prior EKG; meds adjusted, today's QTc is 430; improvement; avoid other medications which may prolong the QT interval

## 2017-12-08 NOTE — Assessment & Plan Note (Signed)
Seen by nephrologist; affected choice of antibitoics; consulted [place]

## 2017-12-08 NOTE — Assessment & Plan Note (Signed)
Which causes limitations in her ability to perform ADLs and IADLs; referral to C3 care team to see what other assistance may be available; planted seed for patient to consider assisted living or skilled nursing

## 2017-12-19 ENCOUNTER — Other Ambulatory Visit: Payer: Self-pay | Admitting: Family Medicine

## 2017-12-23 ENCOUNTER — Ambulatory Visit (INDEPENDENT_AMBULATORY_CARE_PROVIDER_SITE_OTHER): Payer: Medicare HMO

## 2017-12-23 ENCOUNTER — Other Ambulatory Visit (INDEPENDENT_AMBULATORY_CARE_PROVIDER_SITE_OTHER): Payer: Self-pay | Admitting: Vascular Surgery

## 2017-12-23 ENCOUNTER — Ambulatory Visit (INDEPENDENT_AMBULATORY_CARE_PROVIDER_SITE_OTHER): Payer: Medicare HMO | Admitting: Vascular Surgery

## 2017-12-23 ENCOUNTER — Encounter (INDEPENDENT_AMBULATORY_CARE_PROVIDER_SITE_OTHER): Payer: Self-pay | Admitting: Vascular Surgery

## 2017-12-23 VITALS — BP 116/69 | HR 62 | Resp 18 | Ht 59.0 in | Wt 184.0 lb

## 2017-12-23 DIAGNOSIS — E1122 Type 2 diabetes mellitus with diabetic chronic kidney disease: Secondary | ICD-10-CM | POA: Diagnosis not present

## 2017-12-23 DIAGNOSIS — I739 Peripheral vascular disease, unspecified: Secondary | ICD-10-CM

## 2017-12-23 DIAGNOSIS — Z89612 Acquired absence of left leg above knee: Secondary | ICD-10-CM | POA: Diagnosis not present

## 2017-12-23 DIAGNOSIS — N184 Chronic kidney disease, stage 4 (severe): Secondary | ICD-10-CM

## 2017-12-23 NOTE — Progress Notes (Signed)
Subjective:    Patient ID: Amy [name], female    DOB: 1935/08/27, 82 y.o.   MRN: [id number] Chief Complaint  Patient presents with  . Follow-up    6 month ABI    Patient presents for six-month peripheral artery disease follow-up.  Patient seen with husband.  The patient presents today without complaint.  The patient denies any claudication-like symptoms, rest pain or ulcer formation to the right lower extremity.  The patient denies any issues with her left above-the-knee amputation stump.  The patient is relatively wheelchair dependent however will ambulate with her right lower extremity in the wheelchair.  The patient underwent a bilateral ABI which was notable for right: 0.67, biphasic tibials compared to the previous study on June 24, 2017 there has been increase in right lower extremity arterial flow.  The patient has not gone any recent interventions.  The patient denies any fever, nausea vomiting.  Review of Systems  Constitutional: Negative.   HENT: Negative.   Eyes: Negative.   Respiratory: Negative.   Cardiovascular: Negative.   Gastrointestinal: Negative.   Endocrine: Negative.   Genitourinary: Negative.   Musculoskeletal: Negative.   Skin: Negative.   Allergic/Immunologic: Negative.   Neurological: Negative.   Hematological: Negative.   Psychiatric/Behavioral: Negative.       Objective:   Physical Exam  Constitutional: She is oriented to person, place, and time. She appears well-developed and well-nourished. No distress.  HENT:  Head: Normocephalic and atraumatic.  Right Ear: External ear normal.  Left Ear: External ear normal.  Eyes: Pupils are equal, round, and reactive to light. Conjunctivae and EOM are normal.  Neck: Normal range of motion.  Cardiovascular: Normal rate, regular rhythm, normal heart sounds and intact distal pulses.  Pulses:      Dorsalis pedis pulses are 1+ on the right side.       Posterior tibial pulses are 1+ on the right side.    Left AKA stump: Skin is intact.  Healthy.  Pulmonary/Chest: Effort normal and breath sounds normal.  Musculoskeletal: Normal range of motion. She exhibits no edema.  Neurological: She is alert and oriented to person, place, and time.  Skin: Skin is warm and dry. She is not diaphoretic.  Psychiatric: She has a normal mood and affect. Her behavior is normal. Judgment and thought content normal.  Vitals reviewed.  BP 116/69 (BP Location: Left Arm, Patient Position: Sitting)   Pulse 62   Resp 18   Ht 4\' 11"  (1.499 m)   Wt 184 lb (83.5 kg)   BMI 37.16 kg/m   Past Medical History:  Diagnosis Date  . Anemia    low iron  . Anxiety   . Arthritis   . Bilateral cataracts   . Bronchitis    hx of   . Cataract   . Chronic kidney disease   . Depression   . Diabetes mellitus without complication ([place])   . 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 GERD (gastroesophageal reflux disease)   . Headache   . History of frequent urinary tract infections   . Hx of renal cell cancer    LEFT  . Hyperlipidemia   . Hypertension   . Numbness and tingling    right hand   . Obesity   . Osteopenia 10/05/2017  . Peripheral artery disease ([place])   . Urinary frequency   . Urinary incontinence    Social History  Socioeconomic History  . Marital status: Married    Spouse name: [name]  . Number of children: 1  . Years of education: Not on file  . Highest education level: 10th grade  Occupational History  . Occupation: Retired  Scientific laboratory technician  . Financial resource strain: Not hard at all  . Food insecurity:    Worry: Never true    Inability: Never true  . Transportation needs:    Medical: No    Non-medical: No  Tobacco Use  . Smoking status: Former Smoker    Packs/day: 1.50    Years: 51.00    Pack years: 76.50    Types: Cigarettes    Last attempt to quit: 05/24/2004    Years since quitting: 13.5  . Smokeless tobacco: Never Used  . Tobacco  comment: smoking cessation materials not required  Substance and Sexual Activity  . Alcohol use: Not Currently  . Drug use: No  . Sexual activity: Not Currently  Lifestyle  . Physical activity:    Days per week: 0 days    Minutes per session: 0 min  . Stress: Very much  Relationships  . Social connections:    Talks on phone: Patient refused    Gets together: Patient refused    Attends religious service: Patient refused    Active member of club or organization: Patient refused    Attends meetings of clubs or organizations: Patient refused    Relationship status: Married  . Intimate partner violence:    Fear of current or ex partner: No    Emotionally abused: No    Physically abused: No    Forced sexual activity: No  Other Topics Concern  . Not on file  Social History Narrative  . Not on file   Past Surgical History:  Procedure Laterality Date  . ABDOMINAL HYSTERECTOMY  1978  . CARPAL TUNNEL RELEASE Bilateral   . CHOLECYSTECTOMY    . COLONOSCOPY    . COLONOSCOPY WITH ESOPHAGOGASTRODUODENOSCOPY (EGD)    . ERCP N/A 08/19/2017   Procedure: ENDOSCOPIC RETROGRADE CHOLANGIOPANCREATOGRAPHY (ERCP);  Surgeon: Arta Silence, MD;  Location: [facility] ENDOSCOPY;  Service: Endoscopy;  Laterality: N/A;  . LEG AMPUTATION     above the knee / left   . ROBOT ASSISTED LAPAROSCOPIC NEPHRECTOMY Left 01/15/2015   Procedure: ROBOTIC ASSISTED LAPAROSCOPIC RADICAL NEPHRECTOMY;  Surgeon: Alexis Frock, MD;  Location: [facility] ORS;  Service: Urology;  Laterality: Left;  . stent placement right leg      Family History  Problem Relation Age of Onset  . Cancer Mother        Stomach  . Cancer Father   . Diabetes Brother   . Cancer Brother        oral  . Healthy Son   . Healthy Sister    Allergies  Allergen Reactions  . Fentanyl Other (See Comments)    urine retention  . Penicillins Itching, Rash and Other (See Comments)    Has patient had a PCN reaction causing immediate rash, facial/tongue/throat  swelling, SOB or lightheadedness with hypotension: Yes Has patient had a PCN reaction causing severe rash involving mucus membranes or skin necrosis: No Has patient had a PCN reaction that required hospitalization: No Has patient had a PCN reaction occurring within the last 10 years: Yes If all of the above answers are "NO", then may proceed with Cephalosporin use.      Assessment & Plan:  Patient presents for six-month peripheral artery disease follow-up.  Patient seen with husband.  The patient presents today without complaint.  The patient denies any claudication-like symptoms, rest pain or ulcer formation to the right lower extremity.  The patient denies any issues with her left above-the-knee amputation stump.  The patient is relatively wheelchair dependent however will ambulate with her right lower extremity in the wheelchair.  The patient underwent a bilateral ABI which was notable for right: 0.67, biphasic tibials compared to the previous study on June 24, 2017 there has been increase in right lower extremity arterial flow.  The patient has not gone any recent interventions.  The patient denies any fever, nausea vomiting.  1. PVD (peripheral vascular disease) ([place]) - Stable Studies reviewed with patient. The patient is asymptomatic Physical exam is unchanged ABI stable Patient to follow up in 6 months for close surveillance of her peripheral artery disease. Patient to remain abstinent of tobacco use. I have discussed with the patient at length the risk factors for and pathogenesis of atherosclerotic disease and encouraged a healthy diet, regular exercise regimen and blood pressure / glucose control.  The patient was encouraged to call the office in the interim if he experiences any claudication like symptoms, rest pain or ulcers to his feet / toes.  - VAS [place] ABI WITH/WO TBI; Future - VAS [place] LOWER EXTREMITY ARTERIAL DUPLEX; Future  2. Controlled type 2 diabetes mellitus with stage 4  chronic kidney disease, without long-term current use of insulin (HCC) - Stable Encouraged good control as its slows the progression of atherosclerotic disease  3. S/P AKA (above knee amputation) unilateral, left (HCC) - Stable Healthy. No issues.   Current Outpatient Medications on File Prior to Visit  Medication Sig Dispense Refill  . acetaminophen (TYLENOL) 500 MG tablet Take 1,000 mg by mouth every 6 (six) hours as needed for mild pain, fever or headache.     . AMBULATORY NON FORMULARY MEDICATION Medication Name: Flutter valve use after nebulizer treatment. 1 each 0  . atorvastatin (LIPITOR) 20 MG tablet Take 1 tablet (20 mg total) by mouth at bedtime. 90 tablet 0  . busPIRone (BUSPAR) 10 MG tablet Take 10 mg by mouth 3 (three) times daily.    . citalopram (CELEXA) 10 MG tablet Take 1 tablet (10 mg total) by mouth daily. 90 tablet 0  . clopidogrel (PLAVIX) 75 MG tablet Take 1 tablet (75 mg total) by mouth daily. 90 tablet 3  . donepezil (ARICEPT) 10 MG tablet Take 1 tablet (10 mg total) by mouth at bedtime. 90 tablet 3  . furosemide (LASIX) 20 MG tablet Take 20 mg by mouth daily.     [name] gabapentin (NEURONTIN) 300 MG capsule Take 1 capsule (300 mg total) by mouth 3 (three) times daily. 270 capsule 3  . glucose blood test strip Use as instructed. 100 each 12  . HYDROcodone-acetaminophen (NORCO) 7.5-325 MG tablet Take 1 tablet by mouth 3 (three) times daily as needed. 90 tablet 0  . ipratropium-albuterol (DUONEB) 0.5-2.5 (3) MG/3ML SOLN INHALE THE CONTENTS OF 1 VIAL VIA NEBULIZER THREE TIMES DAILY AS DIRECTED 810 mL 2  . metoprolol tartrate (LOPRESSOR) 50 MG tablet Take 1 tablet (50 mg total) by mouth 2 (two) times daily. 180 tablet 0  . Nebulizers MISC by Does not apply route.    . pantoprazole (PROTONIX) 40 MG tablet Take 1 tablet (40 mg total) by mouth daily. 90 tablet 1  . QUEtiapine (SEROQUEL) 50 MG tablet Take 50 mg by mouth at bedtime.    . ranitidine (ZANTAC) 150 MG capsule Take  1  capsule (150 mg total) by mouth at bedtime. 90 capsule 3  . albuterol (PROVENTIL HFA;VENTOLIN HFA) 108 (90 Base) MCG/ACT inhaler Inhale 2 puffs into the lungs every 6 (six) hours as needed for wheezing or shortness of breath. (Patient not taking: Reported on 12/08/2017) 1 Inhaler 2  . cephALEXin (KEFLEX) 500 MG capsule Take 1 capsule (500 mg total) by mouth 4 (four) times daily. (Patient not taking: Reported on 12/23/2017) 14 capsule 0  . cholestyramine (QUESTRAN) 4 g packet Take 1 packet (4 g total) by mouth 2 (two) times daily with a meal. (Patient not taking: Reported on 12/23/2017) 60 each 11   No current facility-administered medications on file prior to visit.    There are no Patient Instructions on file for this visit. No follow-ups on file.  Regis Wiland A Alto Gandolfo, PA-C

## 2017-12-27 ENCOUNTER — Encounter: Payer: Self-pay | Admitting: Student in an Organized Health Care Education/Training Program

## 2017-12-27 ENCOUNTER — Ambulatory Visit
Payer: Medicare HMO | Attending: Student in an Organized Health Care Education/Training Program | Admitting: Student in an Organized Health Care Education/Training Program

## 2017-12-27 ENCOUNTER — Other Ambulatory Visit: Payer: Self-pay

## 2017-12-27 VITALS — BP 108/65 | HR 63 | Temp 98.4°F | Resp 16 | Ht 62.0 in | Wt 184.0 lb

## 2017-12-27 DIAGNOSIS — M25552 Pain in left hip: Secondary | ICD-10-CM | POA: Insufficient documentation

## 2017-12-27 DIAGNOSIS — F411 Generalized anxiety disorder: Secondary | ICD-10-CM | POA: Diagnosis not present

## 2017-12-27 DIAGNOSIS — F419 Anxiety disorder, unspecified: Secondary | ICD-10-CM | POA: Insufficient documentation

## 2017-12-27 DIAGNOSIS — I129 Hypertensive chronic kidney disease with stage 1 through stage 4 chronic kidney disease, or unspecified chronic kidney disease: Secondary | ICD-10-CM | POA: Diagnosis not present

## 2017-12-27 DIAGNOSIS — G301 Alzheimer's disease with late onset: Secondary | ICD-10-CM

## 2017-12-27 DIAGNOSIS — J449 Chronic obstructive pulmonary disease, unspecified: Secondary | ICD-10-CM | POA: Insufficient documentation

## 2017-12-27 DIAGNOSIS — Z87440 Personal history of urinary (tract) infections: Secondary | ICD-10-CM | POA: Insufficient documentation

## 2017-12-27 DIAGNOSIS — Z905 Acquired absence of kidney: Secondary | ICD-10-CM

## 2017-12-27 DIAGNOSIS — Z79899 Other long term (current) drug therapy: Secondary | ICD-10-CM | POA: Insufficient documentation

## 2017-12-27 DIAGNOSIS — M25551 Pain in right hip: Secondary | ICD-10-CM | POA: Diagnosis not present

## 2017-12-27 DIAGNOSIS — F0281 Dementia in other diseases classified elsewhere with behavioral disturbance: Secondary | ICD-10-CM | POA: Diagnosis not present

## 2017-12-27 DIAGNOSIS — Z760 Encounter for issue of repeat prescription: Secondary | ICD-10-CM | POA: Diagnosis not present

## 2017-12-27 DIAGNOSIS — G894 Chronic pain syndrome: Secondary | ICD-10-CM | POA: Diagnosis not present

## 2017-12-27 DIAGNOSIS — Z6833 Body mass index (BMI) 33.0-33.9, adult: Secondary | ICD-10-CM | POA: Insufficient documentation

## 2017-12-27 DIAGNOSIS — K760 Fatty (change of) liver, not elsewhere classified: Secondary | ICD-10-CM | POA: Diagnosis not present

## 2017-12-27 DIAGNOSIS — I700 Atherosclerosis of aorta: Secondary | ICD-10-CM | POA: Diagnosis not present

## 2017-12-27 DIAGNOSIS — F1120 Opioid dependence, uncomplicated: Secondary | ICD-10-CM

## 2017-12-27 DIAGNOSIS — Z993 Dependence on wheelchair: Secondary | ICD-10-CM | POA: Insufficient documentation

## 2017-12-27 DIAGNOSIS — R5383 Other fatigue: Secondary | ICD-10-CM | POA: Insufficient documentation

## 2017-12-27 DIAGNOSIS — E1151 Type 2 diabetes mellitus with diabetic peripheral angiopathy without gangrene: Secondary | ICD-10-CM | POA: Insufficient documentation

## 2017-12-27 DIAGNOSIS — K219 Gastro-esophageal reflux disease without esophagitis: Secondary | ICD-10-CM | POA: Diagnosis not present

## 2017-12-27 DIAGNOSIS — D509 Iron deficiency anemia, unspecified: Secondary | ICD-10-CM | POA: Insufficient documentation

## 2017-12-27 DIAGNOSIS — Z89612 Acquired absence of left leg above knee: Secondary | ICD-10-CM

## 2017-12-27 DIAGNOSIS — E785 Hyperlipidemia, unspecified: Secondary | ICD-10-CM | POA: Diagnosis not present

## 2017-12-27 DIAGNOSIS — F02818 Dementia in other diseases classified elsewhere, unspecified severity, with other behavioral disturbance: Secondary | ICD-10-CM

## 2017-12-27 DIAGNOSIS — G546 Phantom limb syndrome with pain: Secondary | ICD-10-CM | POA: Diagnosis not present

## 2017-12-27 DIAGNOSIS — E1122 Type 2 diabetes mellitus with diabetic chronic kidney disease: Secondary | ICD-10-CM | POA: Insufficient documentation

## 2017-12-27 DIAGNOSIS — M8580 Other specified disorders of bone density and structure, unspecified site: Secondary | ICD-10-CM | POA: Insufficient documentation

## 2017-12-27 DIAGNOSIS — N184 Chronic kidney disease, stage 4 (severe): Secondary | ICD-10-CM

## 2017-12-27 DIAGNOSIS — F329 Major depressive disorder, single episode, unspecified: Secondary | ICD-10-CM | POA: Insufficient documentation

## 2017-12-27 DIAGNOSIS — Z85528 Personal history of other malignant neoplasm of kidney: Secondary | ICD-10-CM | POA: Insufficient documentation

## 2017-12-27 DIAGNOSIS — J309 Allergic rhinitis, unspecified: Secondary | ICD-10-CM | POA: Insufficient documentation

## 2017-12-27 DIAGNOSIS — E119 Type 2 diabetes mellitus without complications: Secondary | ICD-10-CM | POA: Diagnosis not present

## 2017-12-27 DIAGNOSIS — Z87891 Personal history of nicotine dependence: Secondary | ICD-10-CM | POA: Insufficient documentation

## 2017-12-27 DIAGNOSIS — E669 Obesity, unspecified: Secondary | ICD-10-CM | POA: Insufficient documentation

## 2017-12-27 MED ORDER — HYDROCODONE-ACETAMINOPHEN 7.5-325 MG PO TABS: 1.0000 | ORAL_TABLET | Freq: Three times a day (TID) | ORAL | 0 refills | Status: AC | PRN

## 2017-12-27 MED ORDER — HYDROCODONE-ACETAMINOPHEN 7.5-325 MG PO TABS: 1.0000 | ORAL_TABLET | Freq: Three times a day (TID) | ORAL | 0 refills | Status: DC | PRN

## 2017-12-27 NOTE — Progress Notes (Signed)
Safety precautions to be maintained throughout the outpatient stay will include: orient to surroundings, keep bed in low position, maintain call bell within reach at all times, provide assistance with transfer out of bed and ambulation.   Nursing Pain Medication Assessment:  Safety precautions to be maintained throughout the outpatient stay will include: orient to surroundings, keep bed in low position, maintain call bell within reach at all times, provide assistance with transfer out of bed and ambulation.  Medication Inspection Compliance: Pill count conducted under aseptic conditions, in front of the patient. Neither the pills nor the bottle was removed from the patient's sight at any time. Once count was completed pills were immediately returned to the patient in their original bottle.  Medication: Hydrocodone/APAP Pill/Patch Count: 50 of 90 pills remain Pill/Patch Appearance: Markings consistent with prescribed medication Bottle Appearance: Standard pharmacy container. Clearly labeled. Filled Date: 07 / 22 / 2019 Last Medication intake:  Today

## 2017-12-27 NOTE — Progress Notes (Signed)
Patient's Name: Amy [name]  MRN: [id number]  Referring Provider: Arnetha Courser, MD  DOB: [birth year]  PCP: Arnetha Courser, MD  DOS: 12/27/2017  Note by: Gillis Santa, MD  Service setting: Ambulatory outpatient  Specialty: Interventional Pain Management  Location: [facility] (AMB) Pain Management Facility    Patient type: Established   Primary Reason(s) for Visit: Encounter for prescription drug management. (Level of risk: moderate)  CC: Medication Refill (Hydrocodone-APAP)  HPI  Amy [name] is a 82 y.o. year old, female patient, who comes today for a medication management evaluation. She has Phantom limb syndrome with pain ([place]); Anxiety; Elevated serum creatinine; Diabetes mellitus type 2, controlled ([place]); GERD (gastroesophageal reflux disease); GAD (generalized anxiety disorder); AB (asthmatic bronchitis); Aortic atherosclerosis ([place]); Cataract; Leg pain; Continuous opioid dependence (HCC); CKD (chronic kidney disease) stage 4, GFR 15-29 ml/min (HCC); COPD, mild ([place]); Excessive falling; Fatty infiltration of liver; H/O adenomatous polyp of colon; Hyperlipidemia; Anterior knee pain; LBP (low back pain); Malaise and fatigue; Abnormal presence of protein in urine; PVD (peripheral vascular disease) ([place]); Allergic rhinitis; At risk for falling; Renal mass; Abnormal EKG; Renal neoplasm; S/p nephrectomy; 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 Her primarily concern today is the Medication Refill (Hydrocodone-APAP)  Pain Assessment: Location: Left Leg Radiating: N/A Onset: More than a month ago Duration: Phantom  pain Quality: Aching Severity: 0-No pain/10 (subjective, self-reported pain score)  Note: Reported level is compatible with observation.                         When using our objective Pain Scale, levels between 6 and 10/10 are said to belong in an emergency room, as it progressively worsens from a 6/10, described as severely limiting, requiring emergency care not usually available at an outpatient pain management facility. At a 6/10 level, communication becomes difficult and requires great effort. Assistance to reach the emergency department may be required. Facial flushing and profuse sweating along with potentially dangerous increases in heart rate and blood pressure will be evident. Effect on ADL: Denies  Timing: Constant Modifying factors: Medications  BP: 108/65  HR: 63  Amy [name] was last scheduled for an appointment on 09/15/2017 for medication management. During today's appointment we reviewed Amy [name] chronic pain status, as well as her outpatient medication regimen.  The patient  reports that she does not use drugs. Her body mass index is 33.65 kg/m.  Further details on both, my assessment(s), as well as the proposed treatment plan, please see below.  Controlled Substance Pharmacotherapy Assessment REMS (Risk Evaluation and Mitigation Strategy)  Analgesic: Hydrocodone 7.5 mg 3 times daily as needed MME/day: 22.5 mg/day.  Janne Napoleon, RN  12/27/2017 11:58 AM  Sign at close encounter Safety precautions to be maintained throughout the outpatient stay will include: orient to surroundings, keep bed in low position, maintain call bell within reach at all times, provide assistance with transfer out of bed and ambulation.   Nursing Pain Medication Assessment:  Safety precautions to be maintained throughout the outpatient stay will include: orient to surroundings, keep bed in low position, maintain call bell within reach at all times, provide assistance with transfer out of bed  and ambulation.  Medication  Inspection Compliance: Pill count conducted under aseptic conditions, in front of the patient. Neither the pills nor the bottle was removed from the patient's sight at any time. Once count was completed pills were immediately returned to the patient in their original bottle.  Medication: Hydrocodone/APAP Pill/Patch Count: 50 of 90 pills remain Pill/Patch Appearance: Markings consistent with prescribed medication Bottle Appearance: Standard pharmacy container. Clearly labeled. Filled Date: 07 / 22 / 2019 Last Medication intake:  Today   Pharmacokinetics: Liberation and absorption (onset of action): WNL Distribution (time to peak effect): WNL Metabolism and excretion (duration of action): WNL         Pharmacodynamics: Desired effects: Analgesia: Amy [name] reports >50% benefit. Functional ability: Patient reports that medication allows her to accomplish basic ADLs Clinically meaningful improvement in function (CMIF): Sustained CMIF goals met Perceived effectiveness: Described as relatively effective, allowing for increase in activities of daily living (ADL) Undesirable effects: Side-effects or Adverse reactions: None reported Monitoring: [place] PMP: Online review of the past 34-monthperiod conducted. Compliant with practice rules and regulations Last UDS on record: No results found for: SUMMARY UDS interpretation: Compliant          Medication Assessment Form: Reviewed. Patient indicates being compliant with therapy Treatment compliance: Compliant Risk Assessment Profile: Aberrant behavior: See prior evaluations. None observed or detected today Comorbid factors increasing risk of overdose: See prior notes. No additional risks detected today Risk of substance use disorder (SUD): Low Opioid Risk Tool - 12/27/17 1154      Family History of Substance Abuse   Alcohol  Negative    Illegal Drugs  Negative    Rx Drugs  Negative      Personal History of  Substance Abuse   Alcohol  Negative    Illegal Drugs  Negative    Rx Drugs  Negative      Age   Age between 80-45years   No      History of Preadolescent Sexual Abuse   History of Preadolescent Sexual Abuse  Negative or Female      Psychological Disease   Psychological Disease  Negative    Depression  Positive      Total Score   Opioid Risk Tool Scoring  1    Opioid Risk Interpretation  Low Risk      ORT Scoring interpretation table:  Score <3 = Low Risk for SUD  Score between 4-7 = Moderate Risk for SUD  Score >8 = High Risk for Opioid Abuse   Risk Mitigation Strategies:  Patient Counseling: Covered Patient-Prescriber Agreement (PPA): Present and active  Notification to other healthcare providers: Done  Pharmacologic Plan: No change in therapy, at this time.             Laboratory Chemistry  Inflammation Markers (CRP: Acute Phase) (ESR: Chronic Phase) Lab Results  Component Value Date   ESRSEDRATE 19 01/27/2017                         Rheumatology Markers Lab Results  Component Value Date   RF <14 01/27/2017   ANA NEGATIVE 01/27/2017                        Renal Function Markers Lab Results  Component Value Date   BUN 13 09/01/2017   CREATININE 1.33 (H) 09/01/2017   BCR 7 08/11/2017   GFRAA 43 (L) 09/01/2017   GFRNONAA 37 (L) 09/01/2017  Hepatic Function Markers Lab Results  Component Value Date   AST 24 09/01/2017   ALT 13 (L) 09/01/2017   ALBUMIN 3.1 (L) 09/01/2017   ALKPHOS 115 09/01/2017   LIPASE 50 08/12/2017                        Electrolytes Lab Results  Component Value Date   NA 137 09/01/2017   K 3.5 09/01/2017   CL 104 09/01/2017   CALCIUM 8.3 (L) 09/01/2017   MG 1.2 (L) 10/05/2012                        Neuropathy Markers Lab Results  Component Value Date   VITAMINB12 456 03/31/2016   FOLATE 9.8 07/14/2016   HGBA1C 5.9 06/15/2017                        Bone Pathology Markers No results found  for: VD25OH, [id number], [id number], [id number], 25OHVITD1, 25OHVITD2, 25OHVITD3, TESTOFREE, TESTOSTERONE                       Coagulation Parameters Lab Results  Component Value Date   PLT 222 09/01/2017                        Cardiovascular Markers Lab Results  Component Value Date   BNP 195 (H) 08/15/2014   TROPONINI <0.03 09/01/2017   HGB 12.3 09/01/2017   HCT 36.9 09/01/2017                         CA Markers No results found for: CEA, CA125, LABCA2                      Note: Lab results reviewed.  Recent Diagnostic Imaging Results  DG Bone Density EXAM: DUAL X-RAY ABSORPTIOMETRY (DXA) FOR BONE MINERAL DENSITY  IMPRESSION: Dear Dr. Enid Derry,  Your patient Amy [name] completed a FRAX assessment on 10/05/2017 using the [place] (analysis version: 14.10) manufactured by [employer]. The following summarizes the results of our evaluation.  PATIENT BIOGRAPHICAL: Name: Amy [name], Amy [name] Patient ID: [id number] Birth Date: 1936-01-17 Height:    62.0 in. Gender:     Female    [age]       Weight:    184.0 lbs. Ethnicity:  White                            Exam Date: 10/05/2017  FRAX* RESULTS:  (version: 3.5) 10-year Probability of Fracture1 Major Osteoporotic Fracture2 Hip Fracture 12.9% 3.1% Population: [place] (Caucasian) Risk Factors: None  Based on Femur (Right) Neck BMD  1 -The 10-year probability of fracture may be lower than reported if the patient has received treatment. 2 -Major Osteoporotic Fracture: Clinical Spine, Forearm, Hip or Shoulder  *FRAX is a Materials engineer of the [employer] for Metabolic Bone Disease, a [place] (WHO) [facility].  ASSESSMENT: The probability of a major osteoporotic fracture is 12.9% within the next ten years.  The probability of a hip fracture is 3.1% within the next ten years.  .  Dear Dr. Enid Derry,  Your patient  Amy [name] completed a BMD test on 10/05/2017 using the [place] (  analysis version: 14.10) manufactured by [employer]. The following summarizes the results of our evaluation.  PATIENT BIOGRAPHICAL: Name: Denelle, Capurro Patient ID: [id number] Birth Date: 11-Jul-1935 Height: 62.0 in. Gender: Female Exam Date: 10/05/2017 Weight: 184.0 lbs. Indications: Advanced Age, Caucasian, Hysterectomy, Left Leg Amputation, Oophorectomy Bilateral, Postmenopausal Fractures: Treatments:  ASSESSMENT:  The BMD measured at Forearm Radius 33% is 0.676 g/cm2 with a T-score of -2.3. This patient is considered osteopenic according to [facility]) criteria. Lumbar spine was excluded due to degenerative changes. Left femur was excluded due to amputation. Site Region Measured Measured WHO Young Adult BMD Date       Age      Classification T-score Right Femur Total 10/05/2017 79.7 Osteopenia -1.6 0.803 g/cm2  Left Forearm Radius 33% 10/05/2017 79.7 Osteopenia -2.3 0.676 g/cm2  World Health Organization [facility]) criteria for post-menopausal, Caucasian Women: Normal:       T-score at or above -1 SD Osteopenia:   T-score between -1 and -2.5 SD Osteoporosis: T-score at or below -2.5 SD  RECOMMENDATIONS: 1. All patients should optimize calcium and vitamin D intake. 2. Consider FDA-approved medical therapies in postmenopausal women and men aged 51 years and older, based on the following: a. A hip or vertebral(clinical or morphometric) fracture b. T-score < -2.5 at the femoral neck or spine after appropriate evaluation to exclude secondary causes c. Low bone mass (T-score between -1.0 and -2.5 at the femoral neck or spine) and a 10-year probability of a hip fracture > 3% or a 10-year probability of a major osteoporosis-related fracture > 20% based on the [place]-adapted WHO algorithm d. Clinician judgment and/or patient preferences may indicate treatment for people with  10-year fracture probabilities above or below these levels FOLLOW-UP: People with diagnosed cases of osteoporosis or at high risk for fracture should have regular bone mineral density tests. For patients eligible for Medicare, routine testing is allowed once every 2 years. The testing frequency can be increased to one year for patients who have rapidly progressing disease, those who are receiving or discontinuing medical therapy to restore bone mass, or have additional risk factors.  I have reviewed this report, and agree with the above findings.  Amy [name]  Electronically Signed   By: Lowella Grip III M.D.   On: 10/05/2017 11:31  Complexity Note: Imaging results reviewed. Results shared with Amy [name], using Layman's terms.                         Meds   Current Outpatient Medications:  .  acetaminophen (TYLENOL) 500 MG tablet, Take 1,000 mg by mouth every 6 (six) hours as needed for mild pain, fever or headache. , Disp: , Rfl:  .  albuterol (PROVENTIL HFA;VENTOLIN HFA) 108 (90 Base) MCG/ACT inhaler, Inhale 2 puffs into the lungs every 6 (six) hours as needed for wheezing or shortness of breath., Disp: 1 Inhaler, Rfl: 2 .  AMBULATORY NON FORMULARY MEDICATION, Medication Name: Flutter valve use after nebulizer treatment., Disp: 1 each, Rfl: 0 .  atorvastatin (LIPITOR) 20 MG tablet, Take 1 tablet (20 mg total) by mouth at bedtime., Disp: 90 tablet, Rfl: 0 .  busPIRone (BUSPAR) 10 MG tablet, Take 10 mg by mouth 3 (three) times daily., Disp: , Rfl:  .  cephALEXin (KEFLEX) 500 MG capsule, Take 1 capsule (500 mg total) by mouth 4 (four) times daily., Disp: 14 capsule, Rfl: 0 .  cholestyramine (QUESTRAN) 4 g packet, Take 1 packet (4 g  total) by mouth 2 (two) times daily with a meal., Disp: 60 each, Rfl: 11 .  citalopram (CELEXA) 10 MG tablet, Take 1 tablet (10 mg total) by mouth daily., Disp: 90 tablet, Rfl: 0 .  clopidogrel (PLAVIX) 75 MG tablet, Take 1 tablet (75 mg  total) by mouth daily., Disp: 90 tablet, Rfl: 3 .  donepezil (ARICEPT) 10 MG tablet, Take 1 tablet (10 mg total) by mouth at bedtime., Disp: 90 tablet, Rfl: 3 .  furosemide (LASIX) 20 MG tablet, Take 20 mg by mouth daily. , Disp: , Rfl:  .  gabapentin (NEURONTIN) 300 MG capsule, Take 1 capsule (300 mg total) by mouth 3 (three) times daily., Disp: 270 capsule, Rfl: 3 .  glucose blood test strip, Use as instructed., Disp: 100 each, Rfl: 12 .  [START ON 01/11/2018] HYDROcodone-acetaminophen (NORCO) 7.5-325 MG tablet, Take 1 tablet by mouth 3 (three) times daily as needed for severe pain., Disp: 90 tablet, Rfl: 0 .  ipratropium-albuterol (DUONEB) 0.5-2.5 (3) MG/3ML SOLN, INHALE THE CONTENTS OF 1 VIAL VIA NEBULIZER THREE TIMES DAILY AS DIRECTED, Disp: 810 mL, Rfl: 2 .  metoprolol tartrate (LOPRESSOR) 50 MG tablet, Take 1 tablet (50 mg total) by mouth 2 (two) times daily., Disp: 180 tablet, Rfl: 0 .  Nebulizers MISC, by Does not apply route., Disp: , Rfl:  .  pantoprazole (PROTONIX) 40 MG tablet, Take 1 tablet (40 mg total) by mouth daily., Disp: 90 tablet, Rfl: 1 .  QUEtiapine (SEROQUEL) 50 MG tablet, Take 50 mg by mouth at bedtime., Disp: , Rfl:  .  ranitidine (ZANTAC) 150 MG capsule, Take 1 capsule (150 mg total) by mouth at bedtime., Disp: 90 capsule, Rfl: 3 .  [START ON 02/10/2018] HYDROcodone-acetaminophen (NORCO) 7.5-325 MG tablet, Take 1 tablet by mouth every 8 (eight) hours as needed for severe pain., Disp: 90 tablet, Rfl: 0 .  [START ON 03/10/2018] HYDROcodone-acetaminophen (NORCO) 7.5-325 MG tablet, Take 1 tablet by mouth every 8 (eight) hours as needed for moderate pain or severe pain., Disp: 90 tablet, Rfl: 0  ROS  Constitutional: Denies any fever or chills Gastrointestinal: No reported hemesis, hematochezia, vomiting, or acute GI distress Musculoskeletal: Denies any acute onset joint swelling, redness, loss of ROM, or weakness Neurological: No reported episodes of acute onset apraxia,  aphasia, dysarthria, agnosia, amnesia, paralysis, loss of coordination, or loss of consciousness  Allergies  Amy [name] is allergic to fentanyl and penicillins.  [place]  Drug: Amy [name]  reports that she does not use drugs. Alcohol:  reports that she drank alcohol. Tobacco:  reports that she quit smoking about 13 years ago. Her smoking use included cigarettes. She has a 76.50 pack-year smoking history. She has never used smokeless tobacco. Medical:  has a past medical history of Anemia, Anxiety, Arthritis, Bilateral cataracts, Bronchitis, Cataract, Chronic kidney disease, Depression, Diabetes mellitus without complication ([place]), DVT (deep venous thrombosis) ([place]), GERD (gastroesophageal reflux disease), Headache, History of frequent urinary tract infections, renal cell cancer, Hyperlipidemia, Hypertension, Numbness and tingling, Obesity, Osteopenia (10/05/2017), Peripheral artery disease ([place]), Urinary frequency, and Urinary incontinence. Surgical: Amy [name]  has a past surgical history that includes Leg amputation; Abdominal hysterectomy (1978); Cholecystectomy; Carpal tunnel release (Bilateral); stent placement right leg ; Robot assisted laparoscopic nephrectomy (Left, 01/15/2015); Colonoscopy; Colonoscopy with esophagogastroduodenoscopy (egd); and ERCP (N/A, 08/19/2017). Family: family history includes Cancer in her brother, father, and mother; Diabetes in her brother; Healthy in her sister and son.  Constitutional Exam  General appearance: Well nourished, well developed, and well hydrated.  In no apparent acute distress Vitals:   12/27/17 1143  BP: 108/65  Pulse: 63  Resp: 16  Temp: 98.4 F (36.9 C)  Weight: 184 lb (83.5 kg)  Height: 5' 2"  (1.575 m)   BMI Assessment: Estimated body mass index is 33.65 kg/m as calculated from the following:   Height as of this encounter: 5' 2"  (1.575 m).   Weight as of this encounter: 184 lb (83.5 kg).  BMI interpretation table: BMI level Category  Range association with higher incidence of chronic pain  <18 kg/m2 Underweight   18.5-24.9 kg/m2 Ideal body weight   25-29.9 kg/m2 Overweight Increased incidence by 20%  30-34.9 kg/m2 Obese (Class I) Increased incidence by 68%  35-39.9 kg/m2 Severe obesity (Class II) Increased incidence by 136%  >40 kg/m2 Extreme obesity (Class III) Increased incidence by 254%   Patient's current BMI Ideal Body weight  Body mass index is 33.65 kg/m. Ideal body weight: 50.1 kg (110 lb 7.2 oz) Adjusted ideal body weight: 63.4 kg (139 lb 13.9 oz)   BMI Readings from Last 4 Encounters:  12/27/17 33.65 kg/m  12/23/17 37.16 kg/m  12/05/17 33.65 kg/m  09/30/17 33.65 kg/m   Wt Readings from Last 4 Encounters:  12/27/17 184 lb (83.5 kg)  12/23/17 184 lb (83.5 kg)  09/30/17 184 lb (83.5 kg)  09/27/17 184 lb (83.5 kg)  Psych/Mental status: Alert, oriented x 3 (person, place, & time)       Eyes: PERLA Respiratory: No evidence of acute respiratory distress Cervical Spine Area Exam  Skin & Axial Inspection: No masses, redness, edema, swelling, or associated skin lesions Alignment: Symmetrical Functional ROM: Unrestricted ROM      Stability: No instability detected Muscle Tone/Strength: Functionally intact. No obvious neuro-muscular anomalies detected. Sensory (Neurological): Unimpaired Palpation: No palpable anomalies                     Upper Extremity (UE) Exam    Side: Right upper extremity  Side: Left upper extremity   Skin & Extremity Inspection: Skin color, temperature, and hair growth are WNL. No peripheral edema or cyanosis. No masses, redness, swelling, asymmetry, or associated skin lesions. No contractures.  Skin & Extremity Inspection: Skin color, temperature, and hair growth are WNL. No peripheral edema or cyanosis. No masses, redness, swelling, asymmetry, or associated skin lesions. No contractures.   Functional ROM: Unrestricted ROM          Functional ROM: Unrestricted ROM            Muscle Tone/Strength: Functionally intact. No obvious neuro-muscular anomalies detected.  Muscle Tone/Strength: Functionally intact. No obvious neuro-muscular anomalies detected.   Sensory (Neurological): Unimpaired          Sensory (Neurological): Unimpaired           Palpation: No palpable anomalies              Palpation: No palpable anomalies               Specialized Test(s): Deferred         Specialized Test(s): Deferred           Thoracic Spine Area Exam  Skin & Axial Inspection: No masses, redness, or swelling Alignment: Symmetrical Functional ROM: Unrestricted ROM Stability: No instability detected Muscle Tone/Strength: Functionally intact. No obvious neuro-muscular anomalies detected. Sensory (Neurological): Unimpaired Muscle strength & Tone: No palpable anomalies  Lumbar Spine Area Exam  Skin & Axial Inspection: No masses, redness, or swelling Alignment: Symmetrical Functional ROM:  Unrestricted ROM       Stability: No instability detected Muscle Tone/Strength: Functionally intact. No obvious neuro-muscular anomalies detected. Sensory (Neurological): Unimpaired Palpation: No palpable anomalies       Provocative Tests: Lumbar Hyperextension and rotation test: evaluation deferred today       Lumbar Lateral bending test: evaluation deferred today       Patrick's Maneuver: evaluation deferred today                    Gait & Posture Assessment  Patient in wheelchair. Significant kyphosis.   Lower Extremity Exam    Side: Right lower extremity  Side: Left lower extremity  Skin & Extremity Inspection: Skin color, temperature, and hair growth are WNL. No peripheral edema or cyanosis. No masses, redness, swelling, asymmetry, or associated skin lesions. No contractures.  Skin & Extremity Inspection: Above knee amputation (AKA)  Functional ROM: Unrestricted ROM          Functional ROM: Decreased ROM          Muscle Tone/Strength: Functionally intact. No obvious  neuro-muscular anomalies detected.  Muscle Tone/Strength: Functionally intact. No obvious neuro-muscular anomalies detected.  Sensory (Neurological): Unimpaired  Sensory (Neurological): Paresthesia (Tingling sensation)  Palpation: No palpable anomalies  Palpation: No palpable anomalies    Assessment  Primary Diagnosis & Pertinent Problem List: The primary encounter diagnosis was Phantom limb syndrome with pain ([place]). 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  Status Diagnosis  Controlled Controlled Controlled 1. Phantom limb syndrome with pain ([place])   2. Chronic pain syndrome   3. CKD (chronic kidney disease) stage 4, GFR 15-29 ml/min (HCC)   4. Late onset Alzheimer's disease with behavioral disturbance   5. S/P AKA (above knee amputation) unilateral, left ([place])   6. 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 Continuous opioid dependence ([place])   8. S/p nephrectomy      General Recommendations: The pain condition that the patient suffers from is best treated with a multidisciplinary approach that involves an increase in physical activity to prevent de-conditioning and worsening of the pain cycle, as well as psychological counseling (formal and/or informal) to address the co-morbid psychological affects of pain. Treatment will often involve judicious use of pain medications and interventional procedures to decrease the pain, allowing the patient to participate in the physical activity that will ultimately produce long-lasting pain reductions. The goal of the multidisciplinary approach is to return the patient to a higher level of overall function and to restore  their ability to perform activities of daily living.  82 year old female with a history of left above-the-knee amputation, phantom limb syndrome, chronic kidney disease, advanced dementia secondary to Alzheimer's disease with phantom limb pain and bilateral hip pain. Patient has been stable on her opioid regimen of hydrocodone 7.5 mg 3 times daily as needed. She is also on gabapentin 300 mg 3 times daily.Here for medication refill on her hydrocodone and Gabapentin. PMP checked and appropriate.  Plan: -Hydrocodone at its current dose, 7.5 mg 3 times daily as needed. Prescription for30month -Continue gabapentin 300 mg 3 times daily.   Plan of Care  Pharmacotherapy (Medications Ordered): Meds ordered this encounter  Medications  . HYDROcodone-acetaminophen (NORCO) 7.5-325 MG tablet  Sig: Take 1 tablet by mouth 3 (three) times daily as needed for severe pain.    Dispense:  90 tablet    Refill:  0    For chronic pain  . HYDROcodone-acetaminophen (NORCO) 7.5-325 MG tablet    Sig: Take 1 tablet by mouth every 8 (eight) hours as needed for severe pain.    Dispense:  90 tablet    Refill:  0    Do not place this medication, or any other prescription from our practice, on "Automatic Refill". Patient may have prescription filled one day early if pharmacy is closed on scheduled refill date. Do not fill until:  To last until:  . HYDROcodone-acetaminophen (NORCO) 7.5-325 MG tablet    Sig: Take 1 tablet by mouth every 8 (eight) hours as needed for moderate pain or severe pain.    Dispense:  90 tablet    Refill:  0    Do not place this medication, or any other prescription from our practice, on "Automatic Refill". Patient may have prescription filled one day early if pharmacy is closed on scheduled refill date.   Time Note: Greater than 50% of the 25 minute(s) of face-to-face time spent with Amy [name], was spent in counseling/coordination of care regarding: the appropriate use of the  pain scale, Amy [name] primary cause of pain, the treatment plan and medication side effects.  Provider-requested follow-up: Return in about 3 months (around 03/29/2018) for Medication Management.  Future Appointments  Date Time Provider [place]  02/13/2018  2:00 PM [facility]  03/01/2018  2:30 PM [facility] LAB [facility] None  03/01/2018  2:45 PM Lequita Asal, MD [facility] None  03/28/2018 11:30 AM Gillis Santa, MD [facility] None  06/30/2018  9:00 AM [facility] V[facility] 2 [facility] None  06/30/2018 10:00 AM [facility] VASC 2 [facility] None  06/30/2018 10:30 AM Dew, Erskine Squibb, MD [facility] None  10/03/2018  2:00 PM [place] [facility] [place] [facility]    Primary Care Physician: Arnetha Courser, MD Location: [facility] Note by: Gillis Santa, M.D Date: 12/27/2017; Time: 12:48 PM  Patient Instructions  You have been given 3 prescriptions for Hydrocodone to last until Nov. 17, 2019

## 2017-12-27 NOTE — Patient Instructions (Signed)
You have been given 3 prescriptions for Hydrocodone to last until Nov. 17, 2019

## 2018-01-03 MED ORDER — CITALOPRAM HYDROBROMIDE 10 MG PO TABS: 10.0000 mg | ORAL_TABLET | Freq: Every day | ORAL | 0 refills | Status: DC

## 2018-01-03 MED ORDER — ATORVASTATIN CALCIUM 20 MG PO TABS: 20.0000 mg | ORAL_TABLET | Freq: Every day | ORAL | 0 refills | Status: DC

## 2018-01-03 NOTE — Addendum Note (Signed)
Addended by: Cathrine Muster C on: 01/03/2018 11:05 AM   Modules accepted: Orders

## 2018-01-03 NOTE — Telephone Encounter (Signed)
Please send Citalopram to [facility] order. Almost out.

## 2018-01-03 NOTE — Addendum Note (Signed)
Addended by: Talaysha Freeberg, Satira Anis on: 01/03/2018 02:22 PM   Modules accepted: Orders

## 2018-01-03 NOTE — Addendum Note (Signed)
Addended by: Cathrine Muster C on: 01/03/2018 11:06 AM   Modules accepted: Orders

## 2018-01-03 NOTE — Telephone Encounter (Signed)
Needs atorvastatin script sent to [facility] order. Only 4 pills left.

## 2018-01-03 NOTE — Addendum Note (Signed)
Addended by: LADA, Satira Anis on: 01/03/2018 02:22 PM   Modules accepted: Orders

## 2018-01-04 DIAGNOSIS — M25521 Pain in right elbow: Secondary | ICD-10-CM | POA: Diagnosis not present

## 2018-01-07 DIAGNOSIS — S31125A Laceration of abdominal wall with foreign body, periumbilic region without penetration into peritoneal cavity, initial encounter: Secondary | ICD-10-CM | POA: Diagnosis not present

## 2018-01-07 DIAGNOSIS — Z89612 Acquired absence of left leg above knee: Secondary | ICD-10-CM | POA: Diagnosis not present

## 2018-01-07 DIAGNOSIS — J42 Unspecified chronic bronchitis: Secondary | ICD-10-CM | POA: Diagnosis not present

## 2018-01-30 ENCOUNTER — Telehealth: Payer: Self-pay | Admitting: Family Medicine

## 2018-01-30 NOTE — Telephone Encounter (Signed)
Sundowning at night; she is not getting much light during the day On seroquel at night She only gets limited light, paneled wood room most of the day Advised light during the day; talk with neurologist about med for sundowning if needed

## 2018-02-03 ENCOUNTER — Ambulatory Visit (INDEPENDENT_AMBULATORY_CARE_PROVIDER_SITE_OTHER): Payer: Medicare HMO | Admitting: Family Medicine

## 2018-02-03 ENCOUNTER — Encounter: Payer: Self-pay | Admitting: Family Medicine

## 2018-02-03 VITALS — BP 118/78 | HR 88 | Temp 98.2°F | Resp 16

## 2018-02-03 DIAGNOSIS — E782 Mixed hyperlipidemia: Secondary | ICD-10-CM

## 2018-02-03 DIAGNOSIS — N184 Chronic kidney disease, stage 4 (severe): Secondary | ICD-10-CM | POA: Diagnosis not present

## 2018-02-03 DIAGNOSIS — N2581 Secondary hyperparathyroidism of renal origin: Secondary | ICD-10-CM | POA: Diagnosis not present

## 2018-02-03 DIAGNOSIS — D649 Anemia, unspecified: Secondary | ICD-10-CM | POA: Diagnosis not present

## 2018-02-03 DIAGNOSIS — L909 Atrophic disorder of skin, unspecified: Secondary | ICD-10-CM | POA: Diagnosis not present

## 2018-02-03 DIAGNOSIS — E1122 Type 2 diabetes mellitus with diabetic chronic kidney disease: Secondary | ICD-10-CM | POA: Diagnosis not present

## 2018-02-03 DIAGNOSIS — R238 Other skin changes: Secondary | ICD-10-CM

## 2018-02-03 DIAGNOSIS — Z23 Encounter for immunization: Secondary | ICD-10-CM | POA: Diagnosis not present

## 2018-02-03 DIAGNOSIS — Z993 Dependence on wheelchair: Secondary | ICD-10-CM | POA: Diagnosis not present

## 2018-02-03 DIAGNOSIS — K760 Fatty (change of) liver, not elsewhere classified: Secondary | ICD-10-CM | POA: Diagnosis not present

## 2018-02-03 DIAGNOSIS — I700 Atherosclerosis of aorta: Secondary | ICD-10-CM | POA: Diagnosis not present

## 2018-02-03 MED ORDER — ZINC 50 MG PO TABS: ORAL_TABLET | ORAL | 0 refills | Status: DC

## 2018-02-03 MED ORDER — VITAMIN C 500 MG PO CHEW: CHEWABLE_TABLET | ORAL | 0 refills | Status: DC

## 2018-02-03 NOTE — Assessment & Plan Note (Signed)
Lose a little weight

## 2018-02-03 NOTE — Assessment & Plan Note (Signed)
Seeing nephrologist every 6 months; try to drink more water

## 2018-02-03 NOTE — Assessment & Plan Note (Signed)
Check CBC 

## 2018-02-03 NOTE — Assessment & Plan Note (Signed)
Monitored by nephrologist

## 2018-02-03 NOTE — Assessment & Plan Note (Signed)
Check lipids today; limit saturated fats 

## 2018-02-03 NOTE — Assessment & Plan Note (Signed)
Foot exam (RIGHT) by MD; check A1c today

## 2018-02-03 NOTE — Assessment & Plan Note (Signed)
Check lipids 

## 2018-02-03 NOTE — Patient Instructions (Addendum)
Try to air things out back there Use methods to alleviate constant pressure, like donut pillow  Check out the information at familydoctor.org entitled "Nutrition for Weight Loss: What You Need to Know about Fad Diets" Try to lose between 1-2 pounds per week by taking in fewer calories and burning off more calories You can succeed by limiting portions, limiting foods dense in calories and fat, becoming more active, and drinking 8 glasses of water a day (64 ounces) Don't skip meals, especially breakfast, as skipping meals may alter your metabolism Do not use over-the-counter weight loss pills or gimmicks that claim rapid weight loss A healthy BMI (or body mass index) is between 18.5 and 24.9 You can calculate your ideal BMI at the [place] website ClubMonetize.fr  Try to limit saturated fats in your diet (bologna, hot dogs, barbeque, cheeseburgers, hamburgers, steak, bacon, sausage, cheese, etc.) and get more fresh fruits, vegetables, and whole grains  Start vitamin C 500 mg or 1000 mg daily just for 2-3 weeks Start zinc sulfate 50 mg daily just for 2-3 weeks

## 2018-02-03 NOTE — Progress Notes (Signed)
BP 118/78   Pulse 88   Temp 98.2 F (36.8 C) (Oral)   Resp 16   SpO2 93%    Subjective:    Patient ID: Amy [name], female    DOB: 1935/07/06, 82 y.o.   MRN: [id number]  HPI: Amy [name] is a 82 y.o. female  Chief Complaint  Patient presents with  . Sore    on bottom    HPI  Patient presents with husband for tender area that is red on both sides of her buttocks; husband states it looks like a bruise now. First noticed a few weeks back; was treating it with a barrier cream they had used at the hospital and got a refill and the drug store. Patient notes it is painful. She is mainly sitting in the wheel chair, recliner or flat on her back in the bed. She is wheelchair bound- left leg amputee. Using aloe vesta on the sore area  No drainage from area, fevers, chills, no open area identified.   Patient was taken off of metoprolol 50mg  at one time; but was placed back on it- states ran out this medication for about 1 week; requested refill.   Diabetes hasn't take medications for this in years.  Lab Results  Component Value Date   HGBA1C 5.9 06/15/2017   High cholesterol, aortic athero; loves hot dogs, little bit of bacon; rare sausage; not much red meat; some cheese Lab Results  Component Value Date   CHOL 149 06/15/2017   HDL 41 (L) 06/15/2017   LDLCALC 75 06/15/2017   TRIG 235 (H) 06/15/2017   CHOLHDL 3.6 06/15/2017   Anemia, iron deficiency  CKD stage 4; seeing kidney doctor  Depression screen [facility] 2/9 02/03/2018 12/27/2017 12/08/2017 09/30/2017 09/15/2017  Decreased Interest 0 1 0 1 0  Down, Depressed, Hopeless 1 0 1 1 0  PHQ - 2 Score 1 1 1 2  0  Altered sleeping 1 - - 3 -  Tired, decreased energy 0 - - 3 -  Change in appetite 1 - - 2 -  Feeling bad or failure about yourself  0 - - 3 -  Trouble concentrating 1 - - 0 -  Moving slowly or fidgety/restless 0 - - 3 -  Suicidal thoughts 0 - - 0 -  PHQ-9 Score 4 - - 16 -  Difficult doing work/chores Not  difficult at all - - Not difficult at all -  Some recent data might be hidden    Relevant past medical, surgical, family and social history reviewed Past Medical History:  Diagnosis Date  . Anemia    low iron  . Anxiety   . Arthritis   . Bilateral cataracts   . Bronchitis    hx of   . Cataract   . Chronic kidney disease   . Depression   . Diabetes mellitus without complication ([place])   . 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 GERD (gastroesophageal reflux disease)   . Headache   . History of frequent urinary tract infections   . Hx of renal cell cancer    LEFT  . Hyperlipidemia   . Hypertension   . Numbness and tingling    right hand   . Obesity   . Osteopenia 10/05/2017  . Peripheral artery disease ([place])   . Urinary frequency   . Urinary incontinence    Past Surgical History:  Procedure Laterality Date  .  ABDOMINAL HYSTERECTOMY  1978  . CARPAL TUNNEL RELEASE Bilateral   . CHOLECYSTECTOMY    . COLONOSCOPY    . COLONOSCOPY WITH ESOPHAGOGASTRODUODENOSCOPY (EGD)    . ERCP N/A 08/19/2017   Procedure: ENDOSCOPIC RETROGRADE CHOLANGIOPANCREATOGRAPHY (ERCP);  Surgeon: Arta Silence, MD;  Location[facility] ENDOSCOPY;  Service: Endoscopy;  Laterality: N/A;  . LEG AMPUTATION     above the knee / left   . ROBOT ASSISTED LAPAROSCOPIC NEPHRECTOMY Left 01/15/2015   Procedure: ROBOTIC ASSISTED LAPAROSCOPIC RADICAL NEPHRECTOMY;  Surgeon: Alexis Frock, MD;  Location: [facility] ORS;  Service: Urology;  Laterality: Left;  . stent placement right leg      Family History  Problem Relation Age of Onset  . Cancer Mother        Stomach  . Cancer Father   . Diabetes Brother   . Cancer Brother        oral  . Healthy Son   . Healthy Sister    Social History   Tobacco Use  . Smoking status: Former Smoker    Packs/day: 1.50    Years: 51.00    Pack years: 76.50    Types: Cigarettes    Last attempt to quit: 05/24/2004    Years since quitting: 13.7    . Smokeless tobacco: Never Used  . Tobacco comment: smoking cessation materials not required  Substance Use Topics  . Alcohol use: Not Currently  . Drug use: No    Interim medical history since last visit reviewed. Allergies and medications reviewed  Review of Systems Per HPI unless specifically indicated above     Objective:    BP 118/78   Pulse 88   Temp 98.2 F (36.8 C) (Oral)   Resp 16   SpO2 93%   Wt Readings from Last 3 Encounters:  12/27/17 184 lb (83.5 kg)  12/23/17 184 lb (83.5 kg)  09/30/17 184 lb (83.5 kg)    Physical Exam  Constitutional: She appears well-developed and well-nourished. No distress.  HENT:  Head: Normocephalic and atraumatic.  Eyes: EOM are normal. No scleral icterus.  Neck: No thyromegaly present.  Cardiovascular: Normal rate, regular rhythm and normal heart sounds.  No murmur heard. Pulses:      Dorsalis pedis pulses are 1+ on the right side.       Posterior tibial pulses are 1+ on the right side.  Pulmonary/Chest: Effort normal and breath sounds normal. No respiratory distress. She has no wheezes.  Abdominal: Soft. Bowel sounds are normal. She exhibits no distension.  Musculoskeletal: She exhibits no edema.       Right foot: There is normal range of motion and no deformity.  Feet:  Right Foot:  Protective Sensation: 5 sites tested. 5 sites sensed.  Skin Integrity: Negative for ulcer, blister or skin breakdown.  Left Foot: amputated Neurological: She is alert.  Skin: Skin is warm and dry. She is not diaphoretic. No pallor.     Macerated skin breakdown kissing lesions on both buttocks medially; bruise on LEFT medial buttock  Psychiatric: She has a normal mood and affect. Her behavior is normal. Judgment and thought content normal.   Diabetic Foot Form - Detailed   Diabetic Foot Exam - detailed Diabetic Foot exam was performed with the following findings:  Yes 02/03/2018 10:53 AM  Visual Foot Exam completed.:  Yes  Pulse Foot Exam  completed.:  Yes  Right Dorsalis Pedis:  Present   Sensory Foot Exam Completed.:  Yes Semmes-Weinstein Monofilament Test R Site 1-Great Toe:  Pos  Results for orders placed or performed in visit on 10/28/17  HM DIABETES EYE EXAM  Result Value Ref Range   HM Diabetic Eye Exam No Retinopathy No Retinopathy      Assessment & Plan:   Problem List Items Addressed This Visit      Cardiovascular and Mediastinum   Aortic atherosclerosis ([place])    Check lipids today; limit saturated fats      Relevant Orders   Lipid panel     Digestive   Fatty infiltration of liver    Lose a little weight      Relevant Orders   COMPLETE METABOLIC PANEL WITH GFR     Endocrine   Secondary hyperparathyroidism of renal origin ([place])    Monitored by nephrologist      Relevant Orders   VITAMIN D 25 Hydroxy (Vit-D Deficiency, Fractures)   Diabetes mellitus type 2, controlled ([place])    Foot exam (RIGHT) by MD; check A1c today      Relevant Orders   Hemoglobin A1c   Urine Microalbumin w/creat. ratio     Genitourinary   CKD (chronic kidney disease) stage 4, GFR 15-29 ml/min (HCC)    Seeing nephrologist every 6 months; try to drink more water        Other   Wheelchair dependent    Now with skin breakdown on both buttocks; offered wound care clinic; declined and will try measures at home and call if getting worse      Hyperlipidemia    Check lipids      Anemia    Check CBC      Relevant Orders   CBC with Differential/Platelet   Iron, TIBC and Ferritin Panel    Other Visit Diagnoses    Skin breakdown    -  Primary   will use pressure relief measures, air things out, vit C, zinc; declined wound care referral; close f/u   Need for immunization against influenza       Relevant Orders   Flu vaccine HIGH DOSE PF (Fluzone High dose) (Completed)       Follow up plan: Return in about 3 weeks (around 02/24/2018) for follow-up visit with Dr. Sanda Klein.  An after-visit summary was  printed and given to the patient at [facility].  Please see the patient instructions which may contain other information and recommendations beyond what is mentioned above in the assessment and plan.  Meds ordered this encounter  Medications  . Zinc 50 MG TABS    Sig: One by mouth daily for 2-3 weeks    Dispense:  30 tablet    Refill:  0  . Ascorbic Acid (VITAMIN C) 500 MG CHEW    Sig: One by mouth daily for 2-3 weeks    Dispense:  30 each    Refill:  0    Orders Placed This Encounter  Procedures  . Flu vaccine HIGH DOSE PF (Fluzone High dose)  . Lipid panel  . Hemoglobin A1c  . COMPLETE METABOLIC PANEL WITH GFR  . VITAMIN D 25 Hydroxy (Vit-D Deficiency, Fractures)  . CBC with Differential/Platelet  . Iron, TIBC and Ferritin Panel  . Urine Microalbumin w/creat. ratio

## 2018-02-03 NOTE — Assessment & Plan Note (Signed)
Now with skin breakdown on both buttocks; offered wound care clinic; declined and will try measures at home and call if getting worse

## 2018-02-04 LAB — CBC WITH DIFFERENTIAL/PLATELET
Basophils Absolute: 80 cells/uL (ref 0–200)
Basophils Relative: 1.2 %
EOS PCT: 6.5 %
Eosinophils Absolute: 436 cells/uL (ref 15–500)
HCT: 36.6 % (ref 35.0–45.0)
Hemoglobin: 12 g/dL (ref 11.7–15.5)
Lymphs Abs: 2412 cells/uL (ref 850–3900)
MCH: 30.8 pg (ref 27.0–33.0)
MCHC: 32.8 g/dL (ref 32.0–36.0)
MCV: 94.1 fL (ref 80.0–100.0)
MPV: 11.2 fL (ref 7.5–12.5)
Monocytes Relative: 8.1 %
NEUTROS PCT: 48.2 %
Neutro Abs: 3229 cells/uL (ref 1500–7800)
Platelets: 239 10*3/uL (ref 140–400)
RBC: 3.89 10*6/uL (ref 3.80–5.10)
RDW: 14.4 % (ref 11.0–15.0)
TOTAL LYMPHOCYTE: 36 %
WBC mixed population: 543 cells/uL (ref 200–950)
WBC: 6.7 10*3/uL (ref 3.8–10.8)

## 2018-02-04 LAB — VITAMIN D 25 HYDROXY (VIT D DEFICIENCY, FRACTURES): Vit D, 25-Hydroxy: 17 ng/mL — ABNORMAL LOW (ref 30–100)

## 2018-02-04 LAB — COMPLETE METABOLIC PANEL WITH GFR
AG Ratio: 1.1 (calc) (ref 1.0–2.5)
ALBUMIN MSPROF: 3.1 g/dL — AB (ref 3.6–5.1)
ALKALINE PHOSPHATASE (APISO): 102 U/L (ref 33–130)
ALT: 12 U/L (ref 6–29)
AST: 19 U/L (ref 10–35)
BILIRUBIN TOTAL: 0.7 mg/dL (ref 0.2–1.2)
BUN / CREAT RATIO: 9 (calc) (ref 6–22)
BUN: 21 mg/dL (ref 7–25)
CHLORIDE: 102 mmol/L (ref 98–110)
CO2: 26 mmol/L (ref 20–32)
Calcium: 8.8 mg/dL (ref 8.6–10.4)
Creat: 2.33 mg/dL — ABNORMAL HIGH (ref 0.60–0.88)
GFR, Est African American: 22 mL/min/{1.73_m2} — ABNORMAL LOW (ref >=60)
GFR, Est Non African American: 19 mL/min/{1.73_m2} — ABNORMAL LOW (ref >=60)
GLOBULIN: 2.8 g/dL (ref 1.9–3.7)
GLUCOSE: 96 mg/dL (ref 65–139)
POTASSIUM: 3.7 mmol/L (ref 3.5–5.3)
SODIUM: 141 mmol/L (ref 135–146)
Total Protein: 5.9 g/dL — ABNORMAL LOW (ref 6.1–8.1)

## 2018-02-04 LAB — HEMOGLOBIN A1C
Hgb A1c MFr Bld: 5.8 % of total Hgb — ABNORMAL HIGH (ref <5.7)
Mean Plasma Glucose: 120 (calc)
eAG (mmol/L): 6.6 (calc)

## 2018-02-04 LAB — IRON,TIBC AND FERRITIN PANEL
%SAT: 23 % (ref 16–45)
Ferritin: 50 ng/mL (ref 16–288)
Iron: 50 ug/dL (ref 45–160)
TIBC: 222 ug/dL — AB (ref 250–450)

## 2018-02-04 LAB — LIPID PANEL
CHOLESTEROL: 124 mg/dL (ref <200)
HDL: 33 mg/dL — AB (ref >50)
LDL CHOLESTEROL (CALC): 57 mg/dL
Non-HDL Cholesterol (Calc): 91 mg/dL (calc) (ref <130)
TRIGLYCERIDES: 288 mg/dL — AB (ref <150)
Total CHOL/HDL Ratio: 3.8 (calc) (ref <5.0)

## 2018-02-04 LAB — MICROALBUMIN / CREATININE URINE RATIO
Creatinine, Urine: 111 mg/dL (ref 20–275)
MICROALB UR: 3.2 mg/dL
MICROALB/CREAT RATIO: 29 ug/mg{creat} (ref <30)

## 2018-02-06 ENCOUNTER — Other Ambulatory Visit: Payer: Self-pay | Admitting: Family Medicine

## 2018-02-06 ENCOUNTER — Other Ambulatory Visit: Payer: Self-pay

## 2018-02-06 ENCOUNTER — Emergency Department: Payer: Medicare HMO

## 2018-02-06 ENCOUNTER — Emergency Department
Admission: EM | Admit: 2018-02-06 | Discharge: 2018-02-06 | Disposition: A | Discharge status: Home / Self Care | Payer: Medicare HMO | Attending: Emergency Medicine | Admitting: Emergency Medicine

## 2018-02-06 DIAGNOSIS — I129 Hypertensive chronic kidney disease with stage 1 through stage 4 chronic kidney disease, or unspecified chronic kidney disease: Secondary | ICD-10-CM | POA: Diagnosis not present

## 2018-02-06 DIAGNOSIS — Z7902 Long term (current) use of antithrombotics/antiplatelets: Secondary | ICD-10-CM | POA: Insufficient documentation

## 2018-02-06 DIAGNOSIS — N390 Urinary tract infection, site not specified: Secondary | ICD-10-CM | POA: Insufficient documentation

## 2018-02-06 DIAGNOSIS — Z87891 Personal history of nicotine dependence: Secondary | ICD-10-CM | POA: Insufficient documentation

## 2018-02-06 DIAGNOSIS — N184 Chronic kidney disease, stage 4 (severe): Secondary | ICD-10-CM | POA: Insufficient documentation

## 2018-02-06 DIAGNOSIS — J449 Chronic obstructive pulmonary disease, unspecified: Secondary | ICD-10-CM | POA: Insufficient documentation

## 2018-02-06 DIAGNOSIS — N289 Disorder of kidney and ureter, unspecified: Secondary | ICD-10-CM | POA: Insufficient documentation

## 2018-02-06 DIAGNOSIS — G301 Alzheimer's disease with late onset: Secondary | ICD-10-CM | POA: Diagnosis not present

## 2018-02-06 DIAGNOSIS — E1122 Type 2 diabetes mellitus with diabetic chronic kidney disease: Secondary | ICD-10-CM | POA: Diagnosis not present

## 2018-02-06 DIAGNOSIS — Z85528 Personal history of other malignant neoplasm of kidney: Secondary | ICD-10-CM | POA: Diagnosis not present

## 2018-02-06 DIAGNOSIS — J439 Emphysema, unspecified: Secondary | ICD-10-CM | POA: Diagnosis not present

## 2018-02-06 DIAGNOSIS — Z79899 Other long term (current) drug therapy: Secondary | ICD-10-CM | POA: Diagnosis not present

## 2018-02-06 DIAGNOSIS — R42 Dizziness and giddiness: Secondary | ICD-10-CM | POA: Diagnosis not present

## 2018-02-06 LAB — BASIC METABOLIC PANEL
ANION GAP: 10 (ref 5–15)
BUN: 21 mg/dL (ref 8–23)
CALCIUM: 8.4 mg/dL — AB (ref 8.9–10.3)
CO2: 29 mmol/L (ref 22–32)
CREATININE: 2.13 mg/dL — AB (ref 0.44–1.00)
Chloride: 101 mmol/L (ref 98–111)
GFR, EST AFRICAN AMERICAN: 24 mL/min — AB (ref >60)
GFR, EST NON AFRICAN AMERICAN: 21 mL/min — AB (ref >60)
Glucose, Bld: 118 mg/dL — ABNORMAL HIGH (ref 70–99)
Potassium: 3.6 mmol/L (ref 3.5–5.1)
SODIUM: 140 mmol/L (ref 135–145)

## 2018-02-06 LAB — CBC
HCT: 39.6 % (ref 35.0–47.0)
Hemoglobin: 13.2 g/dL (ref 12.0–16.0)
MCH: 31.1 pg (ref 26.0–34.0)
MCHC: 33.4 g/dL (ref 32.0–36.0)
MCV: 93.3 fL (ref 80.0–100.0)
PLATELETS: 220 10*3/uL (ref 150–440)
RBC: 4.24 MIL/uL (ref 3.80–5.20)
RDW: 15.6 % — ABNORMAL HIGH (ref 11.5–14.5)
WBC: 6.7 10*3/uL (ref 3.6–11.0)

## 2018-02-06 LAB — URINALYSIS, COMPLETE (UACMP) WITH MICROSCOPIC
BILIRUBIN URINE: NEGATIVE
Glucose, UA: NEGATIVE mg/dL
KETONES UR: NEGATIVE mg/dL
Nitrite: POSITIVE — AB
PROTEIN: NEGATIVE mg/dL
Specific Gravity, Urine: 1.013 (ref 1.005–1.030)
pH: 5 (ref 5.0–8.0)

## 2018-02-06 MED ORDER — CEPHALEXIN 500 MG PO CAPS: 500.0000 mg | ORAL_CAPSULE | Freq: Three times a day (TID) | ORAL | 0 refills | Status: DC

## 2018-02-06 MED ORDER — SODIUM CHLORIDE 0.9 % IV BOLUS
500.0000 mL | Freq: Once | INTRAVENOUS | Status: AC
  Administered 2018-02-06: 500 mL via INTRAVENOUS

## 2018-02-06 MED ORDER — SODIUM CHLORIDE 0.9 % IV SOLN: 1.0000 g | Freq: Once | INTRAVENOUS | Status: DC

## 2018-02-06 MED ORDER — VITAMIN D (ERGOCALCIFEROL) 1.25 MG (50000 UNIT) PO CAPS: 50000.0000 [IU] | ORAL_CAPSULE | ORAL | 1 refills | Status: AC

## 2018-02-06 MED ORDER — CEPHALEXIN 500 MG PO CAPS
500.0000 mg | ORAL_CAPSULE | Freq: Once | ORAL | Status: AC
  Administered 2018-02-06: 500 mg via ORAL
  Filled 2018-02-06: qty 1

## 2018-02-06 NOTE — Discharge Instructions (Signed)
Your kidney function is elevated at 2.1 please follow-up with your kidney doctor tomorrow and take plenty of IV fluids.  You also have a urinary tract infection take the antibiotics until they are gone you if your culture shows that we need to change your antibiotics we will let you know.  Otherwise follow closely with your primary care doctor tomorrow.  Return for any new or worrisome symptoms.

## 2018-02-06 NOTE — ED Notes (Signed)
Pt husband states that they went to the doctor on Friday for blood tests and doctor called them today and told them to come to ER for abnormal "kidney fucntion labs". Pt alert and oriented x 4. Pt hard of hearing. PT only has right leg.

## 2018-02-06 NOTE — ED Triage Notes (Signed)
Pt to the er for abnormal labs and dizziness. Pt has one kidney and Dr Delight Ovens office called with decreased kidney function and told pt to go to the ER. Pt has reported some dizziness today. Decreased appetite and fluid intake per husband.

## 2018-02-06 NOTE — ED Provider Notes (Addendum)
[facility] Emergency Department Provider Note  ____________________________________________   I have reviewed the triage vital signs and the nursing notes. Where available I have reviewed prior notes and, if possible and indicated, outside hospital notes.    HISTORY  Chief Complaint Dizziness    HPI Amy [name] is a 82 y.o. female  Who has a history of chronic renal insufficiency, creatinine usually ranges in the 1.6-1.7 range, she had blood work done 3 days ago to PCP and it showed 2.3 and she was sent to the emergency room 3 days later for further evaluation.  Patient states if it were not for the phone call she would not be here she does not feel unwell otherwise.  She states sometimes she feels a little lightheaded.  She does have a history of urinary tract infection she states.  She denies any chest pain or shortness of breath or nausea vomiting. He states that they do not think she drinks enough fluid she does have a nephrologist has not seen them recently.  States the last time she had her blood drawn she had not been drinking very much for the day before that.  Is trying to do better since then.  Past Medical History:  Diagnosis Date  . Anemia    low iron  . Anxiety   . Arthritis   . Bilateral cataracts   . Bronchitis    hx of   . Cataract   . Chronic kidney disease   . Depression   . Diabetes mellitus without complication ([place])   . 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 GERD (gastroesophageal reflux disease)   . Headache   . History of frequent urinary tract infections   . Hx of renal cell cancer    LEFT  . Hyperlipidemia   . Hypertension   . Numbness and tingling    right hand   . Obesity   . Osteopenia 10/05/2017  . Peripheral artery disease ([place])   . Urinary frequency   . Urinary incontinence     Patient Active Problem List   Diagnosis Date Noted  . Hypertensive renal disease  11/08/2017  . Secondary hyperparathyroidism of renal origin ([place]) 11/08/2017  . QT prolongation 10/27/2017  . Osteopenia 10/05/2017  . Choledocholithiasis 08/13/2017  . Chronic pain syndrome 04/05/2017  . Depression 11/15/2016  . Iron deficiency anemia 09/08/2016  . Anemia 06/29/2016  . Late onset Alzheimer's disease with behavioral disturbance 06/14/2016  . S/P AKA (above knee amputation) unilateral, left ([place]) 06/08/2016  . Wheelchair dependent 03/11/2016  . 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 S/p nephrectomy 01/29/2015  . Renal neoplasm 01/15/2015  . Abnormal EKG 01/02/2015  . Renal mass 12/05/2014  . GERD (gastroesophageal reflux disease) 11/20/2014  . GAD (generalized anxiety disorder) 11/20/2014  . AB (asthmatic bronchitis) 11/20/2014  . Aortic atherosclerosis ([place]) 11/20/2014  . Cataract 11/20/2014  . Leg pain 11/20/2014  . Continuous opioid dependence ([place]) 11/20/2014  . CKD (chronic kidney disease) stage 4, GFR 15-29 ml/min (HCC) 11/20/2014  . COPD, mild ([place]) 11/20/2014  . Excessive falling 11/20/2014  . Fatty infiltration of liver 11/20/2014  . Hyperlipidemia 11/20/2014  . Anterior knee pain 11/20/2014  . LBP (low back pain) 11/20/2014  . Malaise and fatigue 11/20/2014  . Abnormal presence of protein in urine 11/20/2014  . PVD (peripheral vascular disease) ([place]) 11/20/2014  . Allergic rhinitis 11/20/2014  .  At risk for falling 11/20/2014  . Phantom limb syndrome with pain ([place]) 11/05/2014  . Anxiety 11/05/2014  . Elevated serum creatinine 11/05/2014  . Diabetes mellitus type 2, controlled ([place]) 11/05/2014  . H/O adenomatous polyp of colon 07/27/2010    Past Surgical History:  Procedure Laterality Date  . ABDOMINAL HYSTERECTOMY  1978  . CARPAL TUNNEL RELEASE Bilateral   . CHOLECYSTECTOMY    . COLONOSCOPY    . COLONOSCOPY WITH ESOPHAGOGASTRODUODENOSCOPY (EGD)    . ERCP N/A 08/19/2017   Procedure: ENDOSCOPIC RETROGRADE  CHOLANGIOPANCREATOGRAPHY (ERCP);  Surgeon: Arta Silence, MD;  Location: [facility] ENDOSCOPY;  Service: Endoscopy;  Laterality: N/A;  . LEG AMPUTATION     above the knee / left   . ROBOT ASSISTED LAPAROSCOPIC NEPHRECTOMY Left 01/15/2015   Procedure: ROBOTIC ASSISTED LAPAROSCOPIC RADICAL NEPHRECTOMY;  Surgeon: Alexis Frock, MD;  Location: [facility] ORS;  Service: Urology;  Laterality: Left;  . stent placement right leg       Prior to Admission medications   Medication Sig Start Date End Date Taking? Authorizing Provider  acetaminophen (TYLENOL) 500 MG tablet Take 1,000 mg by mouth every 6 (six) hours as needed for mild pain, fever or headache.     [provider]  albuterol (PROVENTIL HFA;VENTOLIN HFA) 108 (90 Base) MCG/ACT inhaler Inhale 2 puffs into the lungs every 6 (six) hours as needed for wheezing or shortness of breath. 06/15/17   Roselee Nova, MD  AMBULATORY NON FORMULARY MEDICATION Medication Name: Flutter valve use after nebulizer treatment. 09/05/17   Laverle Hobby, MD  Ascorbic Acid (VITAMIN C) 500 MG CHEW One by mouth daily for 2-3 weeks 02/03/18   Arnetha Courser, MD  atorvastatin (LIPITOR) 20 MG tablet Take 1 tablet (20 mg total) by mouth at bedtime. 01/03/18   Lada, Satira Anis, MD  busPIRone (BUSPAR) 10 MG tablet Take 10 mg by mouth 3 (three) times daily.    [provider]  cholestyramine (QUESTRAN) 4 g packet Take 1 packet (4 g total) by mouth 2 (two) times daily with a meal. 09/05/17   Lada, Satira Anis, MD  citalopram (CELEXA) 10 MG tablet Take 1 tablet (10 mg total) by mouth daily. 01/03/18   Arnetha Courser, MD  clopidogrel (PLAVIX) 75 MG tablet Take 1 tablet (75 mg total) by mouth daily. 07/28/17   Algernon Huxley, MD  donepezil (ARICEPT) 10 MG tablet Take 1 tablet (10 mg total) by mouth at bedtime. 10/06/17   Arnetha Courser, MD  furosemide (LASIX) 20 MG tablet Take 20 mg by mouth daily.  09/02/17   [provider]  gabapentin (NEURONTIN) 300 MG capsule Take 1  capsule (300 mg total) by mouth 3 (three) times daily. 09/15/17   Gillis Santa, MD  glucose blood test strip Use as instructed. 03/11/16   Roselee Nova, MD  HYDROcodone-acetaminophen ([place]) 7.5-325 MG tablet Take 1 tablet by mouth 3 (three) times daily as needed for severe pain. 01/11/18 02/10/18  Gillis Santa, MD  HYDROcodone-acetaminophen (NORCO) 7.5-325 MG tablet Take 1 tablet by mouth every 8 (eight) hours as needed for severe pain. Patient not taking: Reported on 02/03/2018 02/10/18 03/12/18  Gillis Santa, MD  HYDROcodone-acetaminophen (NORCO) 7.5-325 MG tablet Take 1 tablet by mouth every 8 (eight) hours as needed for moderate pain or severe pain. 03/10/18 04/09/18  Gillis Santa, MD  ipratropium-albuterol (DUONEB) 0.5-2.5 (3) MG/3ML SOLN INHALE THE CONTENTS OF 1 VIAL VIA NEBULIZER THREE TIMES DAILY AS DIRECTED 11/10/17   Laverle Hobby,  MD  metoprolol tartrate (LOPRESSOR) 50 MG tablet Take 1 tablet (50 mg total) by mouth 2 (two) times daily. Patient not taking: Reported on 02/03/2018 06/15/17   Roselee Nova, MD  Nebulizers MISC by Does not apply route.    [provider]  pantoprazole (PROTONIX) 40 MG tablet Take 1 tablet (40 mg total) by mouth daily. Patient not taking: Reported on 02/03/2018 11/08/17   Arnetha Courser, MD  QUEtiapine (SEROQUEL) 50 MG tablet Take 50 mg by mouth at bedtime. 10/27/17   [provider]  ranitidine (ZANTAC) 150 MG capsule Take 1 capsule (150 mg total) by mouth at bedtime. 08/24/17   Arnetha Courser, MD  Vitamin D, Ergocalciferol, (DRISDOL) 50000 units CAPS capsule Take 1 capsule (50,000 Units total) by mouth every 7 (seven) days. 02/06/18 04/03/18  Arnetha Courser, MD  Zinc 50 MG TABS One by mouth daily for 2-3 weeks 02/03/18   Arnetha Courser, MD    Allergies Fentanyl and Penicillins  Family History  Problem Relation Age of Onset  . Cancer Mother        Stomach  . Cancer Father   . Diabetes Brother   . Cancer Brother         oral  . Healthy Son   . Healthy Sister     Social History Social History   Tobacco Use  . Smoking status: Former Smoker    Packs/day: 1.50    Years: 51.00    Pack years: 76.50    Types: Cigarettes    Last attempt to quit: 05/24/2004    Years since quitting: 13.7  . Smokeless tobacco: Never Used  . Tobacco comment: smoking cessation materials not required  Substance Use Topics  . Alcohol use: Not Currently  . Drug use: No    Review of Systems Constitutional: No fever/chills Eyes: No visual changes. ENT: No sore throat. No stiff neck no neck pain Cardiovascular: Denies chest pain. Respiratory: Denies shortness of breath. Gastrointestinal:   no vomiting.  No diarrhea.  No constipation. Genitourinary: Negative for dysuria. Musculoskeletal: Negative lower extremity swelling Skin: Negative for rash. Neurological: Negative for severe headaches, focal weakness or numbness.   ____________________________________________   PHYSICAL EXAM:  VITAL SIGNS: ED Triage Vitals  Enc Vitals Group     BP 02/06/18 1826 (!) 116/57     Pulse Rate 02/06/18 1826 89     Resp 02/06/18 1826 18     Temp 02/06/18 1826 98.1 F (36.7 C)     Temp Source 02/06/18 1826 Oral     SpO2 02/06/18 1825 92 %     Weight 02/06/18 1827 184 lb (83.5 kg)     Height 02/06/18 1827 5\' 3"  (1.6 m)     Head Circumference --      Peak Flow --      Pain Score 02/06/18 1827 0     Pain Loc --      Pain Edu? --      Excl. in [place]? --     Constitutional: Alert and oriented. Well appearing and in no acute distress. Eyes: Conjunctivae are normal Head: Atraumatic HEENT: No congestion/rhinnorhea. Mucous membranes are moist.  Oropharynx non-erythematous Neck:   Nontender with no meningismus, no masses, no stridor Cardiovascular: Normal rate, regular rhythm. Grossly normal heart sounds.  Good peripheral circulation. Respiratory: Normal respiratory effort.  No retractions. Lungs CTAB. Abdominal: Soft and nontender. No  distention. No guarding no rebound Back:  There is no focal tenderness or  step off.  there is no midline tenderness there are no lesions noted. there is no CVA tenderness Musculoskeletal: No lower extremity tenderness, no upper extremity tenderness. No joint effusions, no DVT signs strong distal pulses no edema left amputation noted Neurologic:  Normal speech and language. No gross focal neurologic deficits are appreciated.  Skin:  Skin is warm, dry and intact. No rash noted. Psychiatric: Mood and affect are normal. Speech and behavior are normal.  ____________________________________________   LABS (all labs ordered are listed, but only abnormal results are displayed)  Labs Reviewed  BASIC METABOLIC PANEL - Abnormal; Notable for the following components:      Result Value   Glucose, Bld 118 (*)    Creatinine, Ser 2.13 (*)    Calcium 8.4 (*)    GFR calc non Af Amer 21 (*)    GFR calc Af Amer 24 (*)    All other components within normal limits  CBC - Abnormal; Notable for the following components:   RDW 15.6 (*)    All other components within normal limits  URINALYSIS, COMPLETE (UACMP) WITH MICROSCOPIC - Abnormal; Notable for the following components:   Color, Urine YELLOW (*)    APPearance CLOUDY (*)    Hgb urine dipstick SMALL (*)    Nitrite POSITIVE (*)    Leukocytes, UA LARGE (*)    WBC, UA >50 (*)    Bacteria, UA RARE (*)    All other components within normal limits  URINE CULTURE  CBG MONITORING, ED    Pertinent labs  results that were available during my care of the patient were reviewed by me and considered in my medical decision making (see chart for details). ____________________________________________  EKG  I personally interpreted any EKGs ordered by me or triage Sinus rhythm nonspecific ST changes no acute ischemia normal rate 90, normal axis ____________________________________________  RADIOLOGY  Pertinent labs & imaging results that were available during  my care of the patient were reviewed by me and considered in my medical decision making (see chart for details). If possible, patient and/or family made aware of any abnormal findings.  Dg Chest Port 1 View  Result Date: 02/06/2018 CLINICAL DATA:  Abnormal labs and dizziness EXAM: PORTABLE CHEST 1 VIEW COMPARISON:  Chest x-ray 09/01/2017, CT chest 08/12/2017, 08/08/2017 radiograph, 12/08/2016 FINDINGS: Hyperinflation with emphysematous disease. Streaky atelectasis or scarring at the left base. Stable cardiomediastinal silhouette with aortic atherosclerosis. No pneumothorax. IMPRESSION: Hyperinflation with emphysematous disease and streaky atelectasis or scar at the left base. Electronically Signed   By: Donavan Foil M.D.   On: 02/06/2018 21:58   ____________________________________________    PROCEDURES  Procedure(s) performed: None  Procedures  Critical Care performed: None  ____________________________________________   INITIAL IMPRESSION / ASSESSMENT AND PLAN / ED COURSE  Pertinent labs & imaging results that were available during my care of the patient were reviewed by me and considered in my medical decision making (see chart for details).  Patient here because she was found to be somewhat dehydrated at the doctor's office 3 days ago.  Creatinine is down to 2.1.  She is on Lasix, is hard to tell exactly how much her kidney issues are chronic with a normal worsening over time as is been sometime since we rechecked it recently, or whether or not there is an acute dehydrational issue.  BUN is reassuring and get a give her is very small" of fluid given her Lasix use, she will follow-up with her nephrologist.  I do notice  however she does have a nitrite positive UTI which probably accounts for why she feels lightheaded sometimes.  There is no evidence of acute ischemia or cardiogenic pathology.  Given that, we will treat her with Rocephin here and Keflex at home.  I would like to avoid  Bactrim in this patient because of her history of renal insufficiency, and I would like to avoid Macrobid because of prior resistance in her urine which I have reviewed.  Also has prior resistance to ampicillin.  However, last urine did show sensitivity to Keflex as to the urine before, so we will send her home on Keflex for a few days to see if we can clear if this urinary tract infection which I think will make her feel better.  Otherwise I do not think she meets criteria for admission and her strong preference and her husband states that she go home.  Vital signs are reassuring.  Return precautions follow-up given and understood.  Patient has had Rocephin x3 this year with no allergic reaction as well as Ancef 3 years ago  ----------------------------------------- 10:57 PM on 02/06/2018 -----------------------------------------  Patient's IV infiltrated after 250 cc of fluid, given her history I do not think we have to give her much more she is tolerating liquid very well and family states that she will continue to hydrate.  Patient does not wish repeat IV.  We will therefore give her oral Keflex no evidence of urosepsis.  White count is normal afebrile etc.  We will discharge her with close outpatient follow-up with her PCP and her nephrologist.  If she cannot remember her nephrologist which she can add at this time we will also give her the number for those nephrologist who are covering for this hospital at this time     ____________________________________________   FINAL CLINICAL IMPRESSION(S) / ED DIAGNOSES  Final diagnoses:  Renal insufficiency      This chart was dictated using voice recognition software.  Despite best efforts to proofread,  errors can occur which can change meaning.      Schuyler Amor, MD 02/06/18 2246    Schuyler Amor, MD 02/06/18 2258

## 2018-02-06 NOTE — Progress Notes (Signed)
Sending to ER for acute renal failure

## 2018-02-07 DIAGNOSIS — S31125A Laceration of abdominal wall with foreign body, periumbilic region without penetration into peritoneal cavity, initial encounter: Secondary | ICD-10-CM | POA: Diagnosis not present

## 2018-02-07 DIAGNOSIS — Z89612 Acquired absence of left leg above knee: Secondary | ICD-10-CM | POA: Diagnosis not present

## 2018-02-07 DIAGNOSIS — J42 Unspecified chronic bronchitis: Secondary | ICD-10-CM | POA: Diagnosis not present

## 2018-02-09 LAB — URINE CULTURE: Culture: >=100000 — AB

## 2018-02-13 ENCOUNTER — Ambulatory Visit
Admission: RE | Admit: 2018-02-13 | Discharge: 2018-02-13 | Disposition: A | Discharge status: Home / Self Care | Payer: Medicare HMO | Source: Ambulatory Visit | Attending: Urgent Care | Admitting: Urgent Care

## 2018-02-13 DIAGNOSIS — R911 Solitary pulmonary nodule: Secondary | ICD-10-CM | POA: Diagnosis not present

## 2018-02-13 DIAGNOSIS — I77811 Abdominal aortic ectasia: Secondary | ICD-10-CM | POA: Insufficient documentation

## 2018-02-13 DIAGNOSIS — D49519 Neoplasm of unspecified behavior of unspecified kidney: Secondary | ICD-10-CM | POA: Diagnosis not present

## 2018-02-13 DIAGNOSIS — I700 Atherosclerosis of aorta: Secondary | ICD-10-CM | POA: Insufficient documentation

## 2018-02-13 DIAGNOSIS — J439 Emphysema, unspecified: Secondary | ICD-10-CM | POA: Insufficient documentation

## 2018-02-13 DIAGNOSIS — C649 Malignant neoplasm of unspecified kidney, except renal pelvis: Secondary | ICD-10-CM | POA: Diagnosis not present

## 2018-02-13 DIAGNOSIS — Z905 Acquired absence of kidney: Secondary | ICD-10-CM | POA: Diagnosis not present

## 2018-02-15 ENCOUNTER — Encounter: Payer: Self-pay | Admitting: Family Medicine

## 2018-02-15 ENCOUNTER — Ambulatory Visit (INDEPENDENT_AMBULATORY_CARE_PROVIDER_SITE_OTHER): Payer: Medicare HMO | Admitting: Family Medicine

## 2018-02-15 VITALS — BP 136/82 | HR 89 | Temp 97.8°F

## 2018-02-15 DIAGNOSIS — N390 Urinary tract infection, site not specified: Secondary | ICD-10-CM | POA: Diagnosis not present

## 2018-02-15 DIAGNOSIS — N179 Acute kidney failure, unspecified: Secondary | ICD-10-CM | POA: Diagnosis not present

## 2018-02-15 DIAGNOSIS — N184 Chronic kidney disease, stage 4 (severe): Secondary | ICD-10-CM | POA: Diagnosis not present

## 2018-02-15 DIAGNOSIS — R350 Frequency of micturition: Secondary | ICD-10-CM | POA: Diagnosis not present

## 2018-02-15 DIAGNOSIS — I129 Hypertensive chronic kidney disease with stage 1 through stage 4 chronic kidney disease, or unspecified chronic kidney disease: Secondary | ICD-10-CM | POA: Diagnosis not present

## 2018-02-15 DIAGNOSIS — R609 Edema, unspecified: Secondary | ICD-10-CM | POA: Diagnosis not present

## 2018-02-15 DIAGNOSIS — Z89612 Acquired absence of left leg above knee: Secondary | ICD-10-CM | POA: Diagnosis not present

## 2018-02-15 LAB — POCT URINALYSIS DIPSTICK
Bilirubin, UA: NEGATIVE
Blood, UA: NEGATIVE
Glucose, UA: NEGATIVE
KETONES UA: NEGATIVE
NITRITE UA: NEGATIVE
Odor: NORMAL
PH UA: 6 (ref 5.0–8.0)
PROTEIN UA: NEGATIVE
SPEC GRAV UA: 1.015 (ref 1.010–1.025)
UROBILINOGEN UA: 0.2 U/dL

## 2018-02-15 NOTE — Patient Instructions (Addendum)
Please do avoid all non-steroidal anti-inflammatories (Advil, ibuprofen, Motrin, Aleve, etc.) Stay well-hydrated, drink enough water to keep your urine pale yellow See the kidney doctor this afternoon Chronic Kidney Disease, Adult Chronic kidney disease (CKD) occurs when the kidneys become damaged slowly over a long period of time. The kidneys are a pair of organs that do many important jobs in the body, including:  Removing waste and extra fluid from the blood to make urine.  Making hormones that maintain the amount of fluid in tissues and blood vessels.  Maintaining the right amount of fluids and chemicals in the body.  A small amount of kidney damage may not cause problems, but a large amount of damage may make it hard or impossible for the kidneys to work the way they should. If steps are not taken to slow down kidney damage or to stop it from getting worse, the kidneys may stop working permanently (end-stage renal disease or ESRD). Most of the time, CKD does not go away, but it can often be controlled. People who have CKD are usually able to live normal lives. What are the causes? The most common causes of this condition are diabetes and high blood pressure (hypertension). Other causes include:  Heart and blood vessel (cardiovascular) disease.  Kidney diseases, such as: ? Glomerulonephritis. ? Interstitial nephritis. ? Polycystic kidney disease. ? Renal vascular disease.  Diseases that affect the immune system.  Genetic diseases.  Medicines that damage the kidneys, such as anti-inflammatory medicines.  Being around or being in contact with poisonous (toxic) substances.  A kidney or urinary infection that occurs again and again (recurs).  Vasculitis. This is swelling or inflammation of the blood vessels.  A problem with urine flow that may be caused by: ? Cancer. ? Having kidney stones more than one time. ? An enlarged prostate, in males.  What increases the risk? You  are more likely to develop this condition if you:  Are older than age 85.  Are female.  Are African-American, Hispanic, Asian, [place], or American [place].  Are a current or former smoker.  Are obese.  Have a family history of kidney disease or failure.  Often take medicines that are damaging to the kidneys.  What are the signs or symptoms? Symptoms of this condition include:  Swelling (edema) of the face, legs, ankles, or feet.  Tiredness (lethargy) and having less energy.  Nausea or vomiting.  Confusion or trouble concentrating.  Problems with urination, such as: ? Painful or burning feeling during urination. ? Decreased urine production. ? Frequent urination, especially at night. ? Bloody urine.  Muscle twitches and cramps, especially in the legs.  Shortness of breath.  Weakness.  Loss of appetite.  Metallic taste in the mouth.  Trouble sleeping.  Dry, itchy skin.  A low blood count (anemia).  Pale lining of the eyelids and surface of the eye (conjunctiva).  Symptoms develop slowly and may not be obvious until the kidney damage becomes severe. It is possible to have kidney disease for years without having any symptoms. How is this diagnosed? This condition may be diagnosed based on:  Blood tests.  Urine tests.  Imaging tests, such as an ultrasound or CT scan.  A test in which a sample of tissue is removed from the kidneys to be examined under a microscope (kidney biopsy).  These test results will help your health care provider determine how serious the CKD is. How is this treated? There is no cure for most cases of  this condition, but treatment usually relieves symptoms and prevents or slows the progression of the disease. Treatment may include:  Making diet changes, which may require you to avoid alcohol, salty foods (sodium), and foods that are high in potassium, calcium, and protein.  Medicines: ? To lower blood pressure. ? To  control blood glucose. ? To relieve anemia. ? To relieve swelling. ? To protect your bones. ? To improve the balance of electrolytes in your blood.  Removing toxic waste from the body through types of dialysis, if the kidneys can no longer do their job (kidney failure).  Managing any other conditions that are causing your CKD or making it worse.  Follow these instructions at home: Medicines  Take over-the-counter and prescription medicines only as told by your health care provider. The dose of some medicines that you take may need to be adjusted.  Do not take any new medicines unless approved by your health care provider. Many medicines can worsen your kidney damage.  Do not take any vitamin and mineral supplements unless approved by your health care provider. Many nutritional supplements can worsen your kidney damage. General instructions  Follow your prescribed diet as told by your health care provider.  Do not use any products that contain nicotine or tobacco, such as cigarettes and e-cigarettes. If you need help quitting, ask your health care provider.  Monitor and track your blood pressure at home. Report changes in your blood pressure as told by your health care provider.  If you are being treated for diabetes, monitor and track your blood sugar (blood glucose) levels as told by your health care provider.  Maintain a healthy weight. If you need help with this, ask your health care provider.  Start or continue an exercise plan. Exercise at least 30 minutes a day, 5 days a week.  Keep your immunizations up to date as told by your health care provider.  Keep all follow-up visits as told by your health care provider. This is important. Where to find more information:  American Association of Kidney Patients: BombTimer.gl  National Kidney Foundation: www.kidney.[place]: https://mathis.com/  [facility]: www.lifeoptions.org and  www.kidneyschool.org Contact a health care provider if:  Your symptoms get worse.  You develop new symptoms. Get help right away if:  You develop symptoms of ESRD, which include: ? Headaches. ? Numbness in the hands or feet. ? Easy bruising. ? Frequent hiccups. ? Chest pain. ? Shortness of breath. ? Lack of menstruation, in women.  You have a fever.  You have decreased urine production.  You have pain or bleeding when you urinate. Summary  Chronic kidney disease (CKD) occurs when the kidneys become damaged slowly over a long period of time.  The most common causes of this condition are diabetes and high blood pressure (hypertension).  There is no cure for most cases of this condition, but treatment usually relieves symptoms and prevents or slows the progression of the disease. Treatment may include a combination of medicines and lifestyle changes. This information is not intended to replace advice given to you by your health care provider. Make sure you discuss any questions you have with your health care provider. Document Released: 02/17/2008 Document Revised: 06/17/2016 Document Reviewed: 06/17/2016 Elsevier Interactive Patient Education  [employer].

## 2018-02-15 NOTE — Assessment & Plan Note (Signed)
Wheelchair bound; status post LEFT AKA

## 2018-02-15 NOTE — Progress Notes (Signed)
BP 136/82   Pulse 89   Temp 97.8 F (36.6 C) (Oral)   SpO2 93%    Subjective:    Patient ID: Amy [name], female    DOB: Jan 13, 1936, 82 y.o.   MRN: [id number]  HPI: Amy [name] is a 82 y.o. female  Chief Complaint  Patient presents with  . Hospitalization Follow-up    went for kidney function also had UTI  . Edema    swelling in hands and feet    HPI She had renal failure and went to the ER; her Cr rose from 1.33 to 2.33 and she was sleeping all the time, so I sent her to the ER At the ER, they drew blood; doctor checked her out; they were going to give her fluid but when they tried, her arm was really hurting; the fluid was not going in the vein, it was going all around it; they were not able to give her much fluid at all Gave her a pill for a urinary tract infection and released home She goes to see the kidney doctor today; goes at 2:20 pm  Review of the ER note shows Cr went from 1.33 to 2.33 and then down to 2.13 on Monday; GFR went from 37 to 19 to 21 Urine culture shows pansensitive E coli (02/06/18) They put her on Keflex in the ER; 500 mg three times a day; just finished last dose husband says; finished last night No rash or diarrhea No fevers No NSAIDs Just takes tylenol if needed; hydrocodone per Dr. Holley Raring  She sleeps a lot during the day; not new; does not drink a lot during the day Sleep schedule is off; she sleeps a lot during the day, but stays up at night  Depression screen [facility] 2/9 02/15/2018 02/03/2018 12/27/2017 12/08/2017 09/30/2017  Decreased Interest 0 0 1 0 1  Down, Depressed, Hopeless 0 1 0 1 1  PHQ - 2 Score 0 1 1 1 2   Altered sleeping 0 1 - - 3  Tired, decreased energy 0 0 - - 3  Change in appetite 0 1 - - 2  Feeling bad or failure about yourself  0 0 - - 3  Trouble concentrating 0 1 - - 0  Moving slowly or fidgety/restless 0 0 - - 3  Suicidal thoughts 0 0 - - 0  PHQ-9 Score 0 4 - - 16  Difficult doing work/chores Not difficult at  all Not difficult at all - - Not difficult at all  Some recent data might be hidden   Fall Risk  02/15/2018 02/03/2018 12/27/2017 12/08/2017 09/30/2017  Falls in the past year? Yes Yes Yes Yes Exclusion - non ambulatory  Comment - - - - L above knee amputation and does not use prosthesis for ambulation. Declined my offer to refer for prosthetic adjustment to ensure proper fit  Number falls in past yr: 2 or more 2 or more 2 or more 2 or more -  Injury with Fall? Yes Yes No No -  Risk Factor Category  High Fall Risk High Fall Risk High Fall Risk - -  Risk for fall due to : - - - - -  Follow up - - Falls evaluation completed;Education provided - -    Relevant past medical, surgical, family and social history reviewed Past Medical History:  Diagnosis Date  . Anemia    low iron  . Anxiety   . Arthritis   . Bilateral cataracts   .  Bronchitis    hx of   . Cataract   . Chronic kidney disease   . Depression   . Diabetes mellitus without complication ([place])   . 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 GERD (gastroesophageal reflux disease)   . Headache   . History of frequent urinary tract infections   . Hx of renal cell cancer    LEFT  . Hyperlipidemia   . Hypertension   . Numbness and tingling    right hand   . Obesity   . Osteopenia 10/05/2017  . Peripheral artery disease ([place])   . Urinary frequency   . Urinary incontinence    Past Surgical History:  Procedure Laterality Date  . ABDOMINAL HYSTERECTOMY  1978  . CARPAL TUNNEL RELEASE Bilateral   . CHOLECYSTECTOMY    . COLONOSCOPY    . COLONOSCOPY WITH ESOPHAGOGASTRODUODENOSCOPY (EGD)    . ERCP N/A 08/19/2017   Procedure: ENDOSCOPIC RETROGRADE CHOLANGIOPANCREATOGRAPHY (ERCP);  Surgeon: Arta Silence, MD;  Location: [facility] ENDOSCOPY;  Service: Endoscopy;  Laterality: N/A;  . LEG AMPUTATION     above the knee / left   . ROBOT ASSISTED LAPAROSCOPIC NEPHRECTOMY Left 01/15/2015   Procedure:  ROBOTIC ASSISTED LAPAROSCOPIC RADICAL NEPHRECTOMY;  Surgeon: Alexis Frock, MD;  Location: [facility] ORS;  Service: Urology;  Laterality: Left;  . stent placement right leg      Family History  Problem Relation Age of Onset  . Cancer Mother        Stomach  . Cancer Father   . Diabetes Brother   . Cancer Brother        oral  . Healthy Son   . Healthy Sister    Social History   Tobacco Use  . Smoking status: Former Smoker    Packs/day: 1.50    Years: 51.00    Pack years: 76.50    Types: Cigarettes    Last attempt to quit: 05/24/2004    Years since quitting: 13.7  . Smokeless tobacco: Never Used  . Tobacco comment: smoking cessation materials not required  Substance Use Topics  . Alcohol use: Not Currently  . Drug use: No     [facility] Visit from 02/15/2018 in [facility]  AUDIT-C Score  0      Interim medical history since last visit reviewed. Allergies and medications reviewed  Review of Systems Per HPI unless specifically indicated above     Objective:    BP 136/82   Pulse 89   Temp 97.8 F (36.6 C) (Oral)   SpO2 93%     Physical Exam  Constitutional: She appears well-developed and well-nourished. No distress.  HENT:  Head: Normocephalic and atraumatic.  Eyes: EOM are normal. No scleral icterus.  Neck: No thyromegaly present.  Cardiovascular: Normal rate and regular rhythm.  No murmur heard. Pulmonary/Chest: Effort normal and breath sounds normal. No respiratory distress. She has no wheezes.  Abdominal: Soft. Bowel sounds are normal. She exhibits no distension.  Musculoskeletal: She exhibits edema (nearly translucent edema of the dorsa of the hands, left > right; some edema of the RIGHT leg).  Status post LEFT leg amputation (AKA)  Neurological: She is alert.  Skin: Skin is warm and dry. She is not diaphoretic. No pallor.  Psychiatric: She has a normal mood and affect.    Results for orders placed or performed in visit on 02/15/18  POCT  urinalysis dipstick  Result Value Ref  Range   Color, UA yellow    Clarity, UA clear    Glucose, UA Negative Negative   Bilirubin, UA neg    Ketones, UA neg    Spec Grav, UA 1.015 1.010 - 1.025   Blood, UA neg    pH, UA 6.0 5.0 - 8.0   Protein, UA Negative Negative   Urobilinogen, UA 0.2 0.2 or 1.0 E.U./dL   Nitrite, UA neg    Leukocytes, UA Large (3+) (A) Negative   Appearance clear    Odor normal       Assessment & Plan:   Problem List Items Addressed This Visit      Other   S/P AKA (above knee amputation) unilateral, left (HCC)    Wheelchair bound; status post LEFT AKA       Other Visit Diagnoses    Acute renal failure superimposed on stage 4 chronic kidney disease, unspecified acute renal failure type [facility])    -  Primary   patient seeing nephrologist in a few hours; I will defer labs to that specialist; avoid NSAIDs; hydration discussed; edema may be 2/2 renal disease   Frequent urination       staff already checked urine, patient to have nephrologist also check urine today   Relevant Orders   POCT urinalysis dipstick (Completed)   Urine Culture       Follow up plan: No follow-ups on file.  An after-visit summary was printed and given to the patient at [place].  Please see the patient instructions which may contain other information and recommendations beyond what is mentioned above in the assessment and plan.  No orders of the defined types were placed in this encounter.   Orders Placed This Encounter  Procedures  . Urine Culture  . POCT urinalysis dipstick

## 2018-02-16 LAB — URINE CULTURE
MICRO NUMBER:: 91152628
SPECIMEN QUALITY: ADEQUATE

## 2018-02-27 ENCOUNTER — Encounter: Payer: Self-pay | Admitting: Family Medicine

## 2018-02-27 ENCOUNTER — Ambulatory Visit (INDEPENDENT_AMBULATORY_CARE_PROVIDER_SITE_OTHER): Payer: Medicare HMO | Admitting: Family Medicine

## 2018-02-27 VITALS — BP 132/82 | HR 68 | Temp 98.7°F

## 2018-02-27 DIAGNOSIS — E1122 Type 2 diabetes mellitus with diabetic chronic kidney disease: Secondary | ICD-10-CM | POA: Diagnosis not present

## 2018-02-27 DIAGNOSIS — L89322 Pressure ulcer of left buttock, stage 2: Secondary | ICD-10-CM | POA: Diagnosis not present

## 2018-02-27 DIAGNOSIS — N184 Chronic kidney disease, stage 4 (severe): Secondary | ICD-10-CM

## 2018-02-27 NOTE — Assessment & Plan Note (Signed)
Right foot exam today by MD

## 2018-02-27 NOTE — Progress Notes (Signed)
BP 132/82   Pulse 68   Temp 98.7 F (37.1 C) (Oral)   SpO2 95%    Subjective:    Patient ID: Amy [name], female    DOB: 07/12/1935, 82 y.o.   MRN: [id number]  HPI: Amy [name] is a 82 y.o. female  Chief Complaint  Patient presents with  . Follow-up  . Rash    bottom is worse    HPI Patient is here with her husband The rash on her bottom is worse; using aloe vesta topically; cushion in the chair is pretty decent; mattress pad, plastic She does not want to stay in the bed; stays in the chair in the den all the time really; all that pressure is right there; she does not like to lay in the bed She talked the other night about having to pick up some child at school but it was night and there was no child to pick up; husband said she went on like that for a while; seeing neurologist at [facility] clinic; husband says her memory is getting worse; she woke him up at 3 am to peel potatoes She scratched her right leg on the wheelchair; busted open; about  Husband has been put H2O2 and neosporin on it; getting better, just taking a while to heal  Depression screen [facility] 2/9 02/15/2018 02/03/2018 12/27/2017 12/08/2017 09/30/2017  Decreased Interest 0 0 1 0 1  Down, Depressed, Hopeless 0 1 0 1 1  PHQ - 2 Score 0 1 1 1 2   Altered sleeping 0 1 - - 3  Tired, decreased energy 0 0 - - 3  Change in appetite 0 1 - - 2  Feeling bad or failure about yourself  0 0 - - 3  Trouble concentrating 0 1 - - 0  Moving slowly or fidgety/restless 0 0 - - 3  Suicidal thoughts 0 0 - - 0  PHQ-9 Score 0 4 - - 16  Difficult doing work/chores Not difficult at all Not difficult at all - - Not difficult at all  Some recent data might be hidden   Fall Risk  02/15/2018 02/03/2018 12/27/2017 12/08/2017 09/30/2017  Falls in the past year? Yes Yes Yes Yes Exclusion - non ambulatory  Comment - - - - L above knee amputation and does not use prosthesis for ambulation. Declined my offer to refer for prosthetic  adjustment to ensure proper fit  Number falls in past yr: 2 or more 2 or more 2 or more 2 or more -  Injury with Fall? Yes Yes No No -  Risk Factor Category  High Fall Risk High Fall Risk High Fall Risk - -  Risk for fall due to : - - - - -  Follow up - - Falls evaluation completed;Education provided - -    Relevant past medical, surgical, family and social history reviewed Past Medical History:  Diagnosis Date  . Anemia    low iron  . Anxiety   . Arthritis   . Bilateral cataracts   . Bronchitis    hx of   . Cataract   . Chronic kidney disease   . Depression   . Diabetes mellitus without complication ([place])   . 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 GERD (gastroesophageal reflux disease)   . Headache   . History of frequent urinary tract infections   . Hx of renal cell cancer  LEFT  . Hyperlipidemia   . Hypertension   . Numbness and tingling    right hand   . Obesity   . Osteopenia 10/05/2017  . Peripheral artery disease ([place])   . Urinary frequency   . Urinary incontinence    Past Surgical History:  Procedure Laterality Date  . ABDOMINAL HYSTERECTOMY  1978  . CARPAL TUNNEL RELEASE Bilateral   . CHOLECYSTECTOMY    . COLONOSCOPY    . COLONOSCOPY WITH ESOPHAGOGASTRODUODENOSCOPY (EGD)    . ERCP N/A 08/19/2017   Procedure: ENDOSCOPIC RETROGRADE CHOLANGIOPANCREATOGRAPHY (ERCP);  Surgeon: Arta Silence, MD;  Location: [facility] ENDOSCOPY;  Service: Endoscopy;  Laterality: N/A;  . LEG AMPUTATION     above the knee / left   . ROBOT ASSISTED LAPAROSCOPIC NEPHRECTOMY Left 01/15/2015   Procedure: ROBOTIC ASSISTED LAPAROSCOPIC RADICAL NEPHRECTOMY;  Surgeon: Alexis Frock, MD;  Location: [facility]S;  Service: Urology;  Laterality: Left;  . stent placement right leg      Family History  Problem Relation Age of Onset  . Cancer Mother        Stomach  . Cancer Father   . Diabetes Brother   . Cancer Brother        oral  . Healthy Son   .  Healthy Sister    Social History   Tobacco Use  . Smoking status: Former Smoker    Packs/day: 1.50    Years: 51.00    Pack years: 76.50    Types: Cigarettes    Last attempt to quit: 05/24/2004    Years since quitting: 13.7  . Smokeless tobacco: Never Used  . Tobacco comment: smoking cessation materials not required  Substance Use Topics  . Alcohol use: Not Currently  . Drug use: No     Office Visit from 02/15/2018 in [facility]  AUDIT-C Score  0      Interim medical history since last visit reviewed. Allergies and medications reviewed  Review of Systems Per HPI unless specifically indicated above     Objective:    BP 132/82   Pulse 68   Temp 98.7 F (37.1 C) (Oral)   SpO2 95%   Wt Readings from Last 3 Encounters:  02/06/18 184 lb (83.5 kg)  12/27/17 184 lb (83.5 kg)  12/23/17 184 lb (83.5 kg)    Physical Exam  Constitutional: She appears well-developed and well-nourished.  HENT:  Mouth/Throat: Mucous membranes are normal.  Eyes: EOM are normal. No scleral icterus.  Cardiovascular: Normal rate and regular rhythm.  Pulses:      Dorsalis pedis pulses are 1+ on the right side.  Pulmonary/Chest: Effort normal and breath sounds normal.  Musculoskeletal:       Left knee: She exhibits deformity (LEFT AKA).  Feet:  Right Foot:  Protective Sensation: 5 sites tested. 5 sites sensed.  Skin Integrity: Negative for skin breakdown.  Left Foot: amputated Skin:  Stage 2 decubitus on the LEFT buttock  Psychiatric: She has a normal mood and affect. Her behavior is normal.    Results for orders placed or performed in visit on 02/15/18  Urine Culture  Result Value Ref Range   MICRO NUMBER: [id number]    SPECIMEN QUALITY: ADEQUATE    Sample Source URINE    STATUS: FINAL    Result:      Three or more organisms present, each greater than 10,000 cu/mL. May represent normal flora contamination from external genitalia. No further testing is required.  POCT  urinalysis dipstick  Result  Value Ref Range   Color, UA yellow    Clarity, UA clear    Glucose, UA Negative Negative   Bilirubin, UA neg    Ketones, UA neg    Spec Grav, UA 1.015 1.010 - 1.025   Blood, UA neg    pH, UA 6.0 5.0 - 8.0   Protein, UA Negative Negative   Urobilinogen, UA 0.2 0.2 or 1.0 E.U./dL   Nitrite, UA neg    Leukocytes, UA Large (3+) (A) Negative   Appearance clear    Odor normal       Assessment & Plan:   Problem List Items Addressed This Visit      Endocrine   Diabetes mellitus type 2, controlled ([place])    Right foot exam today by MD       Other Visit Diagnoses    Pressure injury of left buttock, stage 2 ([place])    -  Primary   aim is to alleviate pressure; refer to wound clinic; weight loss   Relevant Orders   AMB referral to wound care center       Follow up plan: Return in about 1 month (around 03/30/2018).  An after-visit summary was printed and given to the patient at [place].  Please see the patient instructions which may contain other information and recommendations beyond what is mentioned above in the assessment and plan.  No orders of the defined types were placed in this encounter.   Orders Placed This Encounter  Procedures  . AMB referral to wound care center

## 2018-02-27 NOTE — Patient Instructions (Signed)
Pressure Injury A pressure injury, sometimes called a bedsore, is an injury to the skin and underlying tissue caused by pressure. Pressure on blood vessels causes decreased blood flow to the skin, which can eventually cause the skin tissue to die and break down into a wound. Pressure injuries usually occur:  Over bony parts of the body such as the tailbone, shoulders, elbows, hips, and heels.  Under medical devices such as respiratory equipment, stockings, tubes, and splints.  Pressure injuries start as reddened areas on the skin and can lead to pain, muscle damage, and infection. Pressure injuries can vary in severity. What are the causes? Pressure injuries are caused by a lack of blood supply to an area of skin. They can occur from intense pressure over a short period of time or from less intense pressure over a long period of time. What increases the risk? This condition is more likely to develop in people who:  Are in the hospital or an extended care facility.  Are bedridden or in a wheelchair.  Have an injury or disease that keeps them from: ? Moving normally. ? Feeling pain or pressure.  Have a condition that: ? Makes them sleepy or less alert. ? Causes poor blood flow.  Need to wear a medical device.  Have poor control of their bladder or bowel functions (incontinence).  Have poor nutrition (malnutrition).  Are of certain ethnicities. People of African American and Latino or Hispanic descent are at higher risk compared to other ethnic groups.  If you are at risk for pressure ulcers, your health care provider may recommend certain types of bedding to help prevent them. These may include foam or gel mattresses covered with one of the following:  A sheepskin blanket.  A pad that is filled with gel, air, water, or foam.  What are the signs or symptoms? The main symptom is a blister or change in skin color that opens into a wound. Other symptoms include:  Red or dark  areas of skin that do not turn white or pale when pressed with a finger.  Pain, warmth, or change of skin texture.  How is this diagnosed? This condition is diagnosed with a medical history and physical exam. You may also have tests, including:  Blood tests to check for infection or signs of poor nutrition.  Imaging studies to check for damage to the deep tissues under your skin.  Blood flow studies.  Your pressure injury will be staged to determine its severity. Staging is an assessment of:  The depth of the pressure injury.  Which tissues are exposed because of the pressure injury.  The causes of the pressure injury.  How is this treated? The main focus of treatment is to help your injury heal. This may be done by:  Relieving or redistributing pressure on your skin. This includes: ? Frequently changing your position. ? Eliminating or minimizing positions that caused the wound or that can make the wound worse. ? Using specific bed mattresses and chair cushions. ? Refitting, resizing, or replacing any medical devices, or padding the skin under them. ? Using creams or powders to prevent rubbing (friction) on the skin.  Keeping your skin clean and dry. This may include using a skin cleanser or skin protectant as told by your health care provider. This may be a lotion, ointment, or spray.  Cleaning your injury and removing any dead tissue from the wound (debridement).  Placing a bandage (dressing) over your injury.  Preventing or treating infection.  This may include antibiotic, antimicrobial, or antiseptic medicines.  Treatment may also include medicine for pain. Sometimes surgery is needed to close the wound with a flap of healthy skin or a piece of skin from another area of your body (graft). You may need surgery if other treatments are not working or if your injury is very deep. Follow these instructions at home: Wound care  Follow instructions from your health care  provider about: ? How to take care of your wound. ? When and how you should change your dressing. ? When you should remove your dressing. If your dressing is dry and stuck when you try to remove it, moisten or wet the dressing with saline or water so that it can be removed without harming your skin or wound tissue.  Check your wound every day for signs of infection. Have a caregiver do this for you if you are not able. Watch for: ? More redness, swelling, or pain. ? More fluid, blood, or pus. ? A bad smell. Skin Care  Keep your skin clean and dry. Gently pat your skin dry.  Do not rub or massage your skin.  Use a skin protectant only as told by your health care provider.  Check your skin every day for any changes in color or any new blisters or sores (ulcers). Have a caregiver do this for you if you are not able. Medicines  Take over-the-counter and prescription medicines only as told by your health care provider.  If you were prescribed an antibiotic medicine, take it or apply it as told by your health care provider. Do not stop taking or using the antibiotic even if your condition improves. Reducing and Redistributing Pressure  Do not lie or sit in one position for a long time. Move or change position every two hours or as told by your health care provider.  Use pillows or cushions to reduce pressure. Ask your health care provider to recommend cushions or pads for you.  Use medical devices that do not rub your skin. Tell your health care provider if one of your medical devices is causing a pressure injury to develop. General instructions   Eat a healthy diet that includes lots of protein. Ask your health care provider for diet advice.  Drink enough fluid to keep your urine clear or pale yellow.  Be as active as you can every day. Ask your health care provider to suggest safe exercises or activities.  Do not abuse drugs or alcohol.  Keep all follow-up visits as told by your  health care provider. This is important.  Do not smoke. Contact a health care provider if:   You have chills or fever.  Your pain medicine is not helping.  You have any changes in skin color.  You have new blisters or sores.  You develop warmth, redness, or swelling near a pressure injury.  You have a bad odor or pus coming from your pressure injury.  You lose control of your bowels or bladder.  You develop new symptoms.  Your wound does not improve after 1-2 weeks of treatment.  You develop a new medical condition, such as diabetes, peripheral vascular disease, or conditions that affect your defense (immune) system. This information is not intended to replace advice given to you by your health care provider. Make sure you discuss any questions you have with your health care provider. Document Released: 05/10/2005 Document Revised: 10/13/2015 Document Reviewed: 09/18/2014 [employer] Interactive Patient Education  [employer].  Preventing Pressure Injuries WHAT IS A PRESSURE INJURY? A pressure injury, previously called a bedsore or a pressure ulcer, is an injury to the skin and underlying tissue caused by pressure. A pressure injury can happen when your skin presses against a surface, such as a mattress or wheelchair seat, for too long. The pressure on the blood vessels causes reduced blood flow to your skin. This can eventually cause the skin tissue to die and break down into a wound. Pressure injuries usually develop:  Over bony parts of the body, such as the tailbone, shoulders, elbows, hips, and heels.  Under medical devices, such as respiratory equipment, stockings, tubes, and splints.  They can cause pain, muscle damage, and infection. HOW DO PRESSURE INJURIES HAPPEN? Pressure injuries are caused by a lack of blood supply to an area of skin. These injuries begin as a reddened area on the skin and can become an open sore. They can result from intense pressure over a  short period of time or from less pressure over a long period of time. Pressure injuries can vary in severity. This condition is more likely to develop in people who:  Are in the hospital or an extended care facility.  Are bedridden or in a wheelchair.  Have an injury or disease that keeps them from: ? Moving normally. ? Feeling pain or pressure. ? Communicating if they feel pain or pressure.  Have a condition that: ? Makes them sleepy or less alert. ? Causes poor blood flow.  Need to wear a medical device.  Have poor control of their bladder or bowel functions (incontinence).  Have poor nutrition (malnutrition).  Have had this condition before.  Are of certain ethnicities. People of African American and Latino or Hispanic descent are at higher risk compared to other ethnic groups.  HOW CAN I PREVENT PRESSURE INJURIES? Skin Care  Keep your skin clean and dry. Gently pat your skin dry.  Do not rub or massage boney areas of your skin.  Moisturize dry skin.  Use gentle cleansers and skin protectants routinely if you are incontinent.  Check your skin every day for any changes in color and for any new blisters or sores. Make sure to check under and around any medical devices and between skin folds. Have a caregiver do this for you if you are not able. Reducing and Redistributing Pressure  Do not lie or sit in one position for a long time. Move or change position every two hours, or as told by your health care provider.  Use pillows or cushions to redistribute pressure. Ask your health care provider to recommend cushions or pads for you.  Use medical devices that to not rub your skin. Tell your health care provider if one of your medical devices is causing pain or irritation. Medicines  Take over-the-counter and prescription medicines only as told by your health care provider.  If you were prescribed an antibiotic medicine, take it or apply it as told by your health care  provider. Do not stop taking or using the antibiotic even if your condition improves. General Instructions  Be as active as you can every day. Ask your health care provider to suggest safe exercises or activities.  Work with your health care provider to manage any chronic health conditions.  Eat a healthy diet that includes lots of protein. Ask your health care provider for diet advice.  Drink enough fluid to keep your urine clear or pale yellow.  Do not abuse drugs or alcohol.  Do not smoke.  Keep all follow-up visits as told by your health care provider. This is important. WHAT STEPS WILL BE TAKEN TO PREVENT PRESSURE INJURIES IF I AM IN THE HOSPITAL? Your health care providers:  Will inspect your skin at least daily. Skin under or around medical devices should be checked at least twice a day while you are in the hospital.  May recommend that you use certain types of bedding to help prevent them. These may include a pad, mattress, or chair cushion that is filled with gel, air, water, or foam.  Will evaluate your nutrition and consult a diet specialist (dietician), if needed.  Will inspect and change any wound dressings regularly.  May help you move into different positions every few hours.  Will adjust any medical devices and braces as needed to limit pressure on your skin.  Will keep your skin clean and dry.  May use gentle cleansers and skin protectants, if you are incontinent.  Will moisturize any dry skin.  Make sure that you let your health care provider know if you feel or see any changes in your skin. This information is not intended to replace advice given to you by your health care provider. Make sure you discuss any questions you have with your health care provider. Document Released: 06/17/2004 Document Revised: 10/16/2015 Document Reviewed: 02/13/2015 [employer] Interactive Patient Education  [employer].

## 2018-03-01 ENCOUNTER — Inpatient Hospital Stay: Payer: Medicare HMO | Attending: Hematology and Oncology | Admitting: Hematology and Oncology

## 2018-03-01 ENCOUNTER — Encounter: Payer: Self-pay | Admitting: Hematology and Oncology

## 2018-03-01 ENCOUNTER — Inpatient Hospital Stay: Payer: Medicare HMO

## 2018-03-01 VITALS — BP 106/52 | HR 96 | Temp 97.3°F | Resp 18 | Ht 63.0 in

## 2018-03-01 DIAGNOSIS — E1122 Type 2 diabetes mellitus with diabetic chronic kidney disease: Secondary | ICD-10-CM | POA: Diagnosis not present

## 2018-03-01 DIAGNOSIS — R911 Solitary pulmonary nodule: Secondary | ICD-10-CM

## 2018-03-01 DIAGNOSIS — D49519 Neoplasm of unspecified behavior of unspecified kidney: Secondary | ICD-10-CM

## 2018-03-01 DIAGNOSIS — F419 Anxiety disorder, unspecified: Secondary | ICD-10-CM | POA: Insufficient documentation

## 2018-03-01 DIAGNOSIS — F329 Major depressive disorder, single episode, unspecified: Secondary | ICD-10-CM | POA: Insufficient documentation

## 2018-03-01 DIAGNOSIS — N189 Chronic kidney disease, unspecified: Secondary | ICD-10-CM

## 2018-03-01 DIAGNOSIS — Z86718 Personal history of other venous thrombosis and embolism: Secondary | ICD-10-CM | POA: Insufficient documentation

## 2018-03-01 DIAGNOSIS — I129 Hypertensive chronic kidney disease with stage 1 through stage 4 chronic kidney disease, or unspecified chronic kidney disease: Secondary | ICD-10-CM

## 2018-03-01 DIAGNOSIS — Z87891 Personal history of nicotine dependence: Secondary | ICD-10-CM | POA: Diagnosis not present

## 2018-03-01 DIAGNOSIS — Z905 Acquired absence of kidney: Secondary | ICD-10-CM | POA: Insufficient documentation

## 2018-03-01 DIAGNOSIS — C649 Malignant neoplasm of unspecified kidney, except renal pelvis: Secondary | ICD-10-CM

## 2018-03-01 DIAGNOSIS — R918 Other nonspecific abnormal finding of lung field: Secondary | ICD-10-CM | POA: Diagnosis not present

## 2018-03-01 DIAGNOSIS — D509 Iron deficiency anemia, unspecified: Secondary | ICD-10-CM | POA: Diagnosis not present

## 2018-03-01 DIAGNOSIS — E669 Obesity, unspecified: Secondary | ICD-10-CM | POA: Insufficient documentation

## 2018-03-01 LAB — CBC WITH DIFFERENTIAL/PLATELET
Abs Immature Granulocytes: 0.02 10*3/uL (ref 0.00–0.07)
Basophils Absolute: 0.1 10*3/uL (ref 0.0–0.1)
Basophils Relative: 1 %
Eosinophils Absolute: 0.4 10*3/uL (ref 0.0–0.5)
Eosinophils Relative: 5 %
HCT: 36.4 % (ref 36.0–46.0)
Hemoglobin: 11.8 g/dL — ABNORMAL LOW (ref 12.0–15.0)
Immature Granulocytes: 0 %
Lymphocytes Relative: 25 %
Lymphs Abs: 1.9 10*3/uL (ref 0.7–4.0)
MCH: 30.8 pg (ref 26.0–34.0)
MCHC: 32.4 g/dL (ref 30.0–36.0)
MCV: 95 fL (ref 80.0–100.0)
Monocytes Absolute: 0.6 10*3/uL (ref 0.1–1.0)
Monocytes Relative: 7 %
Neutro Abs: 4.8 10*3/uL (ref 1.7–7.7)
Neutrophils Relative %: 62 %
Platelets: 190 10*3/uL (ref 150–400)
RBC: 3.83 MIL/uL — ABNORMAL LOW (ref 3.87–5.11)
RDW: 15.6 % — ABNORMAL HIGH (ref 11.5–15.5)
WBC: 7.6 10*3/uL (ref 4.0–10.5)
nRBC: 0 % (ref 0.0–0.2)

## 2018-03-01 LAB — COMPREHENSIVE METABOLIC PANEL
ALT: 14 U/L (ref 0–44)
AST: 26 U/L (ref 15–41)
Albumin: 2.5 g/dL — ABNORMAL LOW (ref 3.5–5.0)
Alkaline Phosphatase: 102 U/L (ref 38–126)
Anion gap: 12 (ref 5–15)
BUN: 19 mg/dL (ref 8–23)
CO2: 24 mmol/L (ref 22–32)
Calcium: 7.9 mg/dL — ABNORMAL LOW (ref 8.9–10.3)
Chloride: 105 mmol/L (ref 98–111)
Creatinine, Ser: 1.69 mg/dL — ABNORMAL HIGH (ref 0.44–1.00)
GFR calc Af Amer: 32 mL/min — ABNORMAL LOW (ref >60)
GFR calc non Af Amer: 27 mL/min — ABNORMAL LOW (ref >60)
Glucose, Bld: 172 mg/dL — ABNORMAL HIGH (ref 70–99)
Potassium: 3.7 mmol/L (ref 3.5–5.1)
Sodium: 141 mmol/L (ref 135–145)
Total Bilirubin: 0.7 mg/dL (ref 0.3–1.2)
Total Protein: 5.9 g/dL — ABNORMAL LOW (ref 6.5–8.1)

## 2018-03-01 NOTE — Progress Notes (Signed)
[facility] day:  03/01/2018  Chief Complaint: Amy [name] is a 82 y.o. female with iron deficiency anemia and a history of stage III renal cell carcinoma who is seen for 6 month assessment.  HPI:   The patient was last seen in the medical oncology clinic on 08/24/2017.  At that time,  patient noted diarrhea for over 6 months.  She initially attributed it to her taking oral iron. She was no longer taking her oral iron. WBC was 8900 with an ANC of 6600. Hemoglobin was 12.2, hematocrit 37.1, platelets 238,000. Creatinine was 1.58 (CrCl 28.9 mL/min).  Ferritin was 67.  Chest, abdomen, and pelvic CT on 02/13/2018 revealed interval growth of 8 mm solid medial left lower lobe pulmonary nodule, cannot exclude enlarging pulmonary metastasis. This nodule was slightly below PET resolution. Close chest CT surveillance was suggested in 3 months.  There was interval growth of 9 mm medial left upper lobe ground-glass pulmonary nodule. Cannot exclude primary bronchogenic adenocarcinoma. Continued close chest CT surveillance advised.  There were no additional potential findings of metastatic disease.  There was no evidence of local tumor recurrence in the left nephrectomy bed.  There was an ectatic 2.8 cm infrarenal abdominal aorta, stable at risk for aneurysm development. Recommend follow-up aortic ultrasound in 5 years.  During the interim, she notes a bed sore on her bottom.  She was seen by Dr. Sanda Klein.  Plan is for wound care on 03/10/2018.  She states that she fees "ok" except for her butt.  She notes that her diarrhea went away after taking Questran 2x/day.  She is now taking Questran 1x/day.   Past Medical History:  Diagnosis Date  . Anemia    low iron  . Anxiety   . Arthritis   . Bilateral cataracts   . Bronchitis    hx of   . Cataract   . Chronic kidney disease   . Depression   . Diabetes mellitus without complication ([place])   . DVT (deep venous  thrombosis) ([facility])    hx of in left leg currently has left AKA   . GERD (gastroesophageal reflux disease)   . Headache   . History of frequent urinary tract infections   . Hx of renal cell cancer    LEFT  . Hyperlipidemia   . Hypertension   . Numbness and tingling    right hand   . Obesity   . Osteopenia 10/05/2017  . Peripheral artery disease ([place])   . Urinary frequency   . Urinary incontinence     Past Surgical History:  Procedure Laterality Date  . ABDOMINAL HYSTERECTOMY  1978  . CARPAL TUNNEL RELEASE Bilateral   . CHOLECYSTECTOMY    . COLONOSCOPY    . COLONOSCOPY WITH ESOPHAGOGASTRODUODENOSCOPY (EGD)    . ERCP N/A 08/19/2017   Procedure: ENDOSCOPIC RETROGRADE CHOLANGIOPANCREATOGRAPHY (ERCP);  Surgeon: Arta Silence, MD;  Location: [facility] ENDOSCOPY;  Service: Endoscopy;  Laterality: N/A;  . LEG AMPUTATION     above the knee / left   . ROBOT ASSISTED LAPAROSCOPIC NEPHRECTOMY Left 01/15/2015   Procedure: ROBOTIC ASSISTED LAPAROSCOPIC RADICAL NEPHRECTOMY;  Surgeon: Alexis Frock, MD;  Location: [facility] ORS;  Service: Urology;  Laterality: Left;  . stent placement right leg       Family History  Problem Relation Age of Onset  . Cancer Mother        Stomach  . Cancer Father   . Diabetes Brother   . Cancer  Brother        oral  . Healthy Son   . Healthy Sister     Social History:  reports that she quit smoking about 13 years ago. Her smoking use included cigarettes. She has a 76.50 pack-year smoking history. She has never used smokeless tobacco. She reports that she drank alcohol. She reports that she does not use drugs.  She is an Therapist, sports.  She lives in [place].  The patient is accompanied by her husband, Amy [name], today.  Allergies:  Allergies  Allergen Reactions  . Fentanyl Other (See Comments)    urine retention  . Penicillins Itching, Rash and Other (See Comments)    Has patient had a PCN reaction causing immediate rash, facial/tongue/throat swelling, SOB or lightheadedness with  hypotension: Yes Has patient had a PCN reaction causing severe rash involving mucus membranes or skin necrosis: No Has patient had a PCN reaction that required hospitalization: No Has patient had a PCN reaction occurring within the last 10 years: Yes If all of the above answers are "NO", then may proceed with Cephalosporin use.    Current Medications: Current Outpatient Medications  Medication Sig Dispense Refill  . AMBULATORY NON FORMULARY MEDICATION Medication Name: Flutter valve use after nebulizer treatment. 1 each 0  . Ascorbic Acid (VITAMIN C) 500 MG CHEW One by mouth daily for 2-3 weeks 30 each 0  . atorvastatin (LIPITOR) 20 MG tablet Take 1 tablet (20 mg total) by mouth at bedtime. 90 tablet 0  . busPIRone (BUSPAR) 10 MG tablet Take 10 mg by mouth 3 (three) times daily.    . cholestyramine (QUESTRAN) 4 g packet Take 1 packet (4 g total) by mouth 2 (two) times daily with a meal. 60 each 11  . citalopram (CELEXA) 10 MG tablet Take 1 tablet (10 mg total) by mouth daily. 90 tablet 0  . clopidogrel (PLAVIX) 75 MG tablet Take 1 tablet (75 mg total) by mouth daily. 90 tablet 3  . donepezil (ARICEPT) 10 MG tablet Take 1 tablet (10 mg total) by mouth at bedtime. 90 tablet 3  . furosemide (LASIX) 20 MG tablet Take 40 mg by mouth daily.     [name] gabapentin (NEURONTIN) 300 MG capsule Take 1 capsule (300 mg total) by mouth 3 (three) times daily. 270 capsule 3  . glucose blood test strip Use as instructed. 100 each 12  . HYDROcodone-acetaminophen (NORCO) 7.5-325 MG tablet Take 1 tablet by mouth every 8 (eight) hours as needed for severe pain. 90 tablet 0  . [START ON 03/10/2018] HYDROcodone-acetaminophen (NORCO) 7.5-325 MG tablet Take 1 tablet by mouth every 8 (eight) hours as needed for moderate pain or severe pain. 90 tablet 0  . ipratropium-albuterol (DUONEB) 0.5-2.5 (3) MG/3ML SOLN INHALE THE CONTENTS OF 1 VIAL VIA NEBULIZER THREE TIMES DAILY AS DIRECTED 810 mL 2  . metoprolol tartrate (LOPRESSOR)  50 MG tablet Take 1 tablet (50 mg total) by mouth 2 (two) times daily. 180 tablet 0  . Nebulizers MISC by Does not apply route.    . pantoprazole (PROTONIX) 40 MG tablet Take 1 tablet (40 mg total) by mouth daily. 90 tablet 1  . QUEtiapine (SEROQUEL) 50 MG tablet Take 50 mg by mouth at bedtime.    . ranitidine (ZANTAC) 150 MG capsule Take 1 capsule (150 mg total) by mouth at bedtime. 90 capsule 3  . Vitamin D, Ergocalciferol, (DRISDOL) 50000 units CAPS capsule Take 1 capsule (50,000 Units total) by mouth every 7 (seven) days. 4 capsule 1  .  Zinc 50 MG TABS One by mouth daily for 2-3 weeks 30 tablet 0  . acetaminophen (TYLENOL) 500 MG tablet Take 1,000 mg by mouth every 6 (six) hours as needed for mild pain, fever or headache.     . albuterol (PROVENTIL HFA;VENTOLIN HFA) 108 (90 Base) MCG/ACT inhaler Inhale 2 puffs into the lungs every 6 (six) hours as needed for wheezing or shortness of breath. (Patient not taking: Reported on 03/01/2018) 1 Inhaler 2   No current facility-administered medications for this visit.     Review of Systems:  GENERAL:  Feels "ok" except for butt.\.  No fevers, sweats.  No new weight. PERFORMANCE STATUS (ECOG):  2-3 HEENT:  No visual changes, runny nose, sore throat, mouth sores or tenderness. Lungs: No shortness of breath or cough.  No hemoptysis. Cardiac:  No chest pain, palpitations, orthopnea, or PND. GI:  Diarrhea resolved with Questran.  No nausea, vomiting, diarrhea, constipation, melena or hematochezia. GU:  No urgency, frequency, dysuria, or hematuria. Musculoskeletal:  No back pain.  No joint pain.  No muscle tenderness. Extremities:  No pain or swelling. Skin:  Bed sore on bottom.  No rashes or skin changes. Neuro:  No headache, numbness or weakness, balance or coordination issues. Endocrine:  No diabetes, thyroid issues, hot flashes or night sweats. Psych:  No mood changes, depression or anxiety. Pain:  Pain associated with bed sore. Review of systems:   All other systems reviewed and found to be negative.   Physical Exam: Blood pressure (!) 106/52, pulse 96, temperature (!) 97.3 F (36.3 C), temperature source Tympanic, resp. rate 18, height 5' 3"  (1.6 m), SpO2 94 %. GENERAL:  Well developed, well nourished, woman sitting comfortably in the exam room in no acute distress. MENTAL STATUS:  Alert and oriented to person, place and time. HEAD:  Short graying hair.  Normocephalic, atraumatic, face symmetric, no Cushingoid features. EYES:  Glasses.  Brown eyes.  Pupils equal round and reactive to light and accomodation.  No conjunctivitis or scleral icterus. ENT:  Oropharynx clear without lesion.  Tongue normal. Mucous membranes moist.  RESPIRATORY:  Clear to auscultation without rales, wheezes or rhonchi. CARDIOVASCULAR:  Regular rate and rhythm without murmur, rub or gallop. ABDOMEN:  Soft, non-tender, with active bowel sounds, and no hepatosplenomegaly.  No masses. SKIN:  Bed sore not examined. EXTREMITIES: Left above the knee amputation.  Chronic right lower extremity edema.  No tenderness.  No palpable cords. LYMPH NODES: No palpable cervical, supraclavicular, axillary or inguinal adenopathy  NEUROLOGICAL: Unremarkable. PSYCH:  Appropriate.    Imaging studies: 11/29/2014:  Abdomen and pelvic CT revealed a 5.5 x 3.7 solid enhancing mass arising from the upper pole of the left kidney.  There was evidence of renal vein invasion.  There were borderline enlarged periaortic lymph node (9 mm).  08/16/2016:  Chest, abdomen, and pelvic CT revealed left nephrectomy without evidence of metastatic disease.  There were low-attenuation lesions in the right kidney, likely cysts although definitive characterization is limited without post-contrast imaging.  There was an ectatic abdominal aorta at risk for aneurysm development.  02/16/2017:  Chest, abdomen and pelvic CT revealed no evidence of recurrent disease.  There was a 7 x 5 mm right upper lobe pulmonary  nodule (previously 6 x 4 mm). There was a 3 mm (previously 1-2 mm) left lower lobe pulmonary nodule. 08/12/2017:  Chest CT revealed no active pulmonary disease. Mild centrilobular emphysema. There was stable appearing pleural-based nodular opacity in the anterior aspect of the right  upper lobe measuring 5.6 mm. There was stable 4 mm medial left upper lobe pleural-based opacity is unchanged.  08/12/2017:  Abdomen and pelvic CT revealed a 9 mm hyperdensity along the expected location of the distal common bile duct near the ampulla with dilatation of the CBD to 17 mm.  Simple renal cysts of the right kidney were noted with a 1.1 cm hyperdense complex appearing lesion possibly representing a hemorrhagic or proteinaceous cyst.  No worrisome features were identified with respect of this complex lesion.   08/14/2017:  MR abdomen MRCP revealed a solitary 9 mm choledocholith in the mid to upper common bile duct. There was mild diffuse intrahepatic biliary ductal dilatation with dilated CBD (13 mm diameter).  There was indeterminate small 1.2 cm renal cortical mass in the posterior lower right kidney, incompletely characterized on this noncontrast MRI study, renal neoplasm not excluded. Recommend attention on follow-up MRI (preferred) or CT abdomen without and with IV contrast in 6 months.  There was ectatic 2.7 cm infrarenal abdominal aorta.  Ectatic abdominal aorta at risk for aneurysm development. Recommend followup by ultrasound in 5 years. 02/13/2018:  Chest, abdomen, and pelvic CT  revealed interval growth of 8 mm solid medial left lower lobe pulmonary nodule, cannot exclude enlarging pulmonary metastasis. This nodule was slightly below PET resolution. There was interval growth of 9 mm medial left upper lobe ground-glass pulmonary nodule. There were no additional potential findings of metastatic disease.  There was no evidence of local tumor recurrence in the left nephrectomy bed.  There was an ectatic 2.8 cm  infrarenal abdominal aorta, stable at risk for aneurysm development.    Appointment on 03/01/2018  Component Date Value Ref Range Status  . Sodium 03/01/2018 141  135 - 145 mmol/L Final  . Potassium 03/01/2018 3.7  3.5 - 5.1 mmol/L Final  . Chloride 03/01/2018 105  98 - 111 mmol/L Final  . CO2 03/01/2018 24  22 - 32 mmol/L Final  . Glucose, Bld 03/01/2018 172* 70 - 99 mg/dL Final  . BUN 03/01/2018 19  8 - 23 mg/dL Final  . Creatinine, Ser 03/01/2018 1.69* 0.44 - 1.00 mg/dL Final  . Calcium 03/01/2018 7.9* 8.9 - 10.3 mg/dL Final  . Total Protein 03/01/2018 5.9* 6.5 - 8.1 g/dL Final  . Albumin 03/01/2018 2.5* 3.5 - 5.0 g/dL Final  . AST 03/01/2018 26  15 - 41 U/L Final  . ALT 03/01/2018 14  0 - 44 U/L Final  . Alkaline Phosphatase 03/01/2018 102  38 - 126 U/L Final  . Total Bilirubin 03/01/2018 0.7  0.3 - 1.2 mg/dL Final  . GFR calc non Af Amer 03/01/2018 27* >60 mL/min Final  . GFR calc Af Amer 03/01/2018 32* >60 mL/min Final   Comment: (NOTE) The eGFR has been calculated using the CKD EPI equation. This calculation has not been validated in all clinical situations. eGFR's persistently <60 mL/min signify possible Chronic Kidney Disease.   Georgiann Hahn gap 03/01/2018 12  5 - 15 Final   Performed at [facility], [address], [place], [place] [address]  . WBC 03/01/2018 7.6  4.0 - 10.5 K/uL Final  . RBC 03/01/2018 3.83* 3.87 - 5.11 MIL/uL Final  . Hemoglobin 03/01/2018 11.8* 12.0 - 15.0 g/dL Final  . HCT 03/01/2018 36.4  36.0 - 46.0 % Final  . MCV 03/01/2018 95.0  80.0 - 100.0 fL Final  . MCH 03/01/2018 30.8  26.0 - 34.0 pg Final  . MCHC 03/01/2018 32.4  30.0 -  36.0 g/dL Final  . RDW 03/01/2018 15.6* 11.5 - 15.5 % Final  . Platelets 03/01/2018 190  150 - 400 K/uL Final  . nRBC 03/01/2018 0.0  0.0 - 0.2 % Final  . Neutrophils Relative % 03/01/2018 62  % Final  . Neutro Abs 03/01/2018 4.8  1.7 - 7.7 K/uL Final  . Lymphocytes Relative 03/01/2018 25  % Final  . Lymphs  Abs 03/01/2018 1.9  0.7 - 4.0 K/uL Final  . Monocytes Relative 03/01/2018 7  % Final  . Monocytes Absolute 03/01/2018 0.6  0.1 - 1.0 K/uL Final  . Eosinophils Relative 03/01/2018 5  % Final  . Eosinophils Absolute 03/01/2018 0.4  0.0 - 0.5 K/uL Final  . Basophils Relative 03/01/2018 1  % Final  . Basophils Absolute 03/01/2018 0.1  0.0 - 0.1 K/uL Final  . Immature Granulocytes 03/01/2018 0  % Final  . Abs Immature Granulocytes 03/01/2018 0.02  0.00 - 0.07 K/uL Final   Performed at [facility], [address] [place], [place] [address]    Assessment:  TISHINA LOWN is a 82 y.o. female with mild iron deficiency anemia.  EGD and colonoscopy on 10/10/2013 were negative per patient.  She has had reflux for the past 15-30 years.  She is on Prilosec.  Diet is poor.  She denies any pica.  Work-up on 07/14/2016 revealed a hematocrit of 35.5, hemoglobin 11.6, MCV 87.6, platelets 173,000, WBC 7900 with an ANC of 5000.  SPEP revealed no monoclonal protein.  Ferritin was 22 (low).  Folate was 9.8.  Guaiac cards were negative x 3.  She is on oral iron.  She has a history of stage III renal cell carcinoma s/p robotic-assisted laparoscopic left radical nephrectomy on 01/09/2015. Pathology revealed a 6.3 cm renal cell carcinoma.  Tumor was predominantly clear cell with a minor component of type I papillary.  Fuhrman grade 3 of 4.  Tumor extended into the renal vein.  There was focal extension into the perirenal and sinus adipose tissue.  Margins were negative.  There was metastatic tumor in 2 of 2 lymph nodes.  Pathologic stage was [id number].    Abdomen and pelvic CT scan on 11/29/2014 revealed a 5.5 x 3.7 solid enhancing mass arising from the upper pole of the left kidney.  There was evidence of renal vein invasion.  There were borderline enlarged periaortic lymph node (9 mm).   Chest CT on 08/12/2017 revealed no active pulmonary disease. Mild centrilobular emphysema. There was stable appearing  pleural-based nodular opacity in the anterior aspect of the right upper lobe measuring 5.6 mm. There was stable 4 mm medial left upper lobe pleural-based opacity is unchanged.   Abdomen and pelvic CT on 08/12/2017 revealed a 9 mm hyperdensity along the expected location of the distal common bile duct near the ampulla with dilatation of the CBD to 17 mm.  Simple renal cysts of the right kidney were noted with a 1.1 cm hyperdense complex appearing lesion possibly representing a hemorrhagic or proteinaceous cyst.  No worrisome features were identified with respect of this complex lesion.    Chest, abdomen, and pelvic CT on 02/13/2018 revealed interval growth of 8 mm solid medial left lower lobe pulmonary nodule, cannot exclude enlarging pulmonary metastasis. This nodule was slightly below PET resolution. There was interval growth of 9 mm medial left upper lobe ground-glass pulmonary nodule. There were no additional potential findings of metastatic disease.  There was no evidence of local tumor recurrence in the left  nephrectomy bed.  There was an ectatic 2.8 cm infrarenal abdominal aorta, stable at risk for aneurysm development.   She underwent ERCP on 08/19/2017 for chloedocholithiasis.  One pancreatic stent was placed into the ventral pancreatic duct.  A filling defect consistent with a stone was seen on the cholangiogram. The entire main bile duct and common hepatic duct were moderately dilated.  Choledocholithiasis was found. Complete removal was accomplished by biliary sphincterotomy and basket extraction.  She has iron deficiency anemia.  Ferritin was 18 on 08/10/2016, 22 on 09/07/2016, 69 on 12/07/2016, 81 on 02/23/2017, 67 on 08/24/2017, 50 on 02/03/2018, and 22 on 03/01/2018.  She has chronic renal insufficiency.  Creatinine was 1.86 (CrCl 26 ml/min) on 06/15/2016.  She has a history of left lower extremity DVT.  She is s/p left AKA.  Symptomatically, her diarrhea has resolved on Questran.  She  has a bed sore.  Hemoglobin is 11.8.  MCV is 95.  Plan: 1.  Labs today: CBC with differential, CMP, ferritin. 2.  Iron deficiency anemia:  Hemoglobin is 11.8.  Ferritin is 22. 3.  Stage III renal cell carcinoma:  Review interval CT scans.  Scans personally reviewed.  Etiology of pulmonary nodules unclear (LLL and LUL).  Discuss plans for follow-up imaging. 4.  Pulmonary nodules:  Patient has a history of smoking.  Patient has a history of renal cell carcinoma.  Discuss plan for follow-up chest CT in 3 months. 5.  Chest CT on 05/26/2018. 6.  RTC on 05/31/2018 for MD assessment, labs (CBC with diff, ferritin), and review of chest CT.   Lequita Asal, MD  03/01/2018, 3:19 PM

## 2018-03-01 NOTE — Progress Notes (Signed)
Patient pain noted to buttock due to pressure ulcer

## 2018-03-02 LAB — FERRITIN: Ferritin: 22 ng/mL (ref 11–307)

## 2018-03-08 ENCOUNTER — Other Ambulatory Visit: Payer: Self-pay | Admitting: Family Medicine

## 2018-03-08 DIAGNOSIS — E781 Pure hyperglyceridemia: Secondary | ICD-10-CM

## 2018-03-09 DIAGNOSIS — Z89612 Acquired absence of left leg above knee: Secondary | ICD-10-CM | POA: Diagnosis not present

## 2018-03-09 DIAGNOSIS — S31125A Laceration of abdominal wall with foreign body, periumbilic region without penetration into peritoneal cavity, initial encounter: Secondary | ICD-10-CM | POA: Diagnosis not present

## 2018-03-09 DIAGNOSIS — J42 Unspecified chronic bronchitis: Secondary | ICD-10-CM | POA: Diagnosis not present

## 2018-03-10 ENCOUNTER — Encounter: Payer: Medicare HMO | Attending: Physician Assistant | Admitting: Physician Assistant

## 2018-03-10 DIAGNOSIS — E11622 Type 2 diabetes mellitus with other skin ulcer: Secondary | ICD-10-CM | POA: Diagnosis present

## 2018-03-10 DIAGNOSIS — Z87891 Personal history of nicotine dependence: Secondary | ICD-10-CM | POA: Insufficient documentation

## 2018-03-10 DIAGNOSIS — E669 Obesity, unspecified: Secondary | ICD-10-CM | POA: Diagnosis not present

## 2018-03-10 DIAGNOSIS — Z86718 Personal history of other venous thrombosis and embolism: Secondary | ICD-10-CM | POA: Diagnosis not present

## 2018-03-10 DIAGNOSIS — Z905 Acquired absence of kidney: Secondary | ICD-10-CM | POA: Diagnosis not present

## 2018-03-10 DIAGNOSIS — R32 Unspecified urinary incontinence: Secondary | ICD-10-CM | POA: Insufficient documentation

## 2018-03-10 DIAGNOSIS — E1136 Type 2 diabetes mellitus with diabetic cataract: Secondary | ICD-10-CM | POA: Diagnosis not present

## 2018-03-10 DIAGNOSIS — M1990 Unspecified osteoarthritis, unspecified site: Secondary | ICD-10-CM | POA: Insufficient documentation

## 2018-03-10 DIAGNOSIS — F0390 Unspecified dementia without behavioral disturbance: Secondary | ICD-10-CM | POA: Insufficient documentation

## 2018-03-10 DIAGNOSIS — I130 Hypertensive heart and chronic kidney disease with heart failure and stage 1 through stage 4 chronic kidney disease, or unspecified chronic kidney disease: Secondary | ICD-10-CM | POA: Diagnosis not present

## 2018-03-10 DIAGNOSIS — L89312 Pressure ulcer of right buttock, stage 2: Secondary | ICD-10-CM | POA: Diagnosis present

## 2018-03-10 DIAGNOSIS — E1122 Type 2 diabetes mellitus with diabetic chronic kidney disease: Secondary | ICD-10-CM | POA: Diagnosis not present

## 2018-03-10 DIAGNOSIS — Z09 Encounter for follow-up examination after completed treatment for conditions other than malignant neoplasm: Secondary | ICD-10-CM | POA: Diagnosis not present

## 2018-03-10 DIAGNOSIS — E1151 Type 2 diabetes mellitus with diabetic peripheral angiopathy without gangrene: Secondary | ICD-10-CM | POA: Insufficient documentation

## 2018-03-10 DIAGNOSIS — N189 Chronic kidney disease, unspecified: Secondary | ICD-10-CM | POA: Diagnosis not present

## 2018-03-10 DIAGNOSIS — L89322 Pressure ulcer of left buttock, stage 2: Secondary | ICD-10-CM | POA: Diagnosis not present

## 2018-03-10 DIAGNOSIS — L98491 Non-pressure chronic ulcer of skin of other sites limited to breakdown of skin: Secondary | ICD-10-CM | POA: Diagnosis not present

## 2018-03-10 DIAGNOSIS — Z6833 Body mass index (BMI) 33.0-33.9, adult: Secondary | ICD-10-CM | POA: Insufficient documentation

## 2018-03-12 NOTE — Progress Notes (Signed)
Piano, Berklee L. ([id number]) Visit Report for 03/10/2018 Abuse/Suicide Risk Screen Details Patient Name: Amy [name], Amy [name]. Date of Service: 03/10/2018 8:45 AM Medical Record Number: [id number] Patient Account Number: [id number] Date of Birth/Sex: 06-01-1935 (82 y.o. Female) Treating RN: Cornell Barman Primary Care Olanda Boughner: Enid Derry Other Clinician: Referring Charlissa Petros: Enid Derry Treating Eleonor Ocon/Extender: Melburn Hake, HOYT Weeks in Treatment: 0 Abuse/Suicide Risk Screen Items Answer ABUSE/SUICIDE RISK SCREEN: Has anyone close to you tried to hurt or harm you recentlyo No Do you feel uncomfortable with anyone in your familyo No Has anyone forced you do things that you didnot want to doo No Do you have any thoughts of harming yourselfo No Patient displays signs or symptoms of abuse and/or neglect. No Electronic Signature(s) Signed: 03/10/2018 5:38:54 PM By: Gretta Cool, BSN, RN, CWS, Kim RN, BSN Entered By: Gretta Cool, BSN, RN, CWS, Kim on 03/10/2018 09:08:08 Bednarz, Merikay L. ([id number]) -------------------------------------------------------------------------------- Activities of Daily Living Details Patient Name: Amy [name], [name] Date of Service: 03/10/2018 8:45 AM Medical Record Number: [id number] Patient Account Number: [id number] Date of Birth/Sex: 08-Aug-1935 (82 y.o. Female) Treating RN: Cornell Barman Primary Care Vermon Grays: Enid Derry Other Clinician: Referring Karle Desrosier: Enid Derry Treating Robinn Overholt/Extender: Melburn Hake, HOYT Weeks in Treatment: 0 Activities of Daily Living Items Answer Activities of Daily Living (Please select one for each item) Drive Automobile Not Able Take Medications Need Assistance Use Telephone Need Assistance Care for Appearance Need Assistance Use Toilet Need Assistance Bath / Shower Need Assistance Dress Self Need Assistance Feed Self Need Assistance Walk Need Assistance Get In / Out Bed Need Assistance Housework Need  Assistance Prepare Meals Need Assistance Handle Money Need Assistance Shop for Self Need Assistance Electronic Signature(s) Signed: 03/10/2018 5:38:54 PM By: Gretta Cool, BSN, RN, CWS, Kim RN, BSN Entered By: Gretta Cool, BSN, RN, CWS, Kim on 03/10/2018 14:48:18 Esbenshade, Rika L. ([id number]) -------------------------------------------------------------------------------- Education Assessment Details Patient Name: Amy [name], [name] Date of Service: 03/10/2018 8:45 AM Medical Record Number: [id number] Patient Account Number: [id number] Date of Birth/Sex: [birth year] (82 y.o. Female) Treating RN: Cornell Barman Primary Care Jamaine Quintin: Enid Derry Other Clinician: Referring Tamorah Hada: Enid Derry Treating Laquanna Veazey/Extender: Melburn Hake, HOYT Weeks in Treatment: 0 Primary Learner Assessed: Patient Learning Preferences/Education Level/Primary Language Learning Preference: Explanation, Demonstration Highest Education Level: High School Cognitive Barrier Assessment/Beliefs Language Barrier: No Translator Needed: No Memory Deficit: No Emotional Barrier: No Cultural/Religious Beliefs Affecting Medical Care: No Physical Barrier Assessment Impaired Vision: Yes Glasses Impaired Hearing: No Decreased Hand dexterity: No Knowledge/Comprehension Assessment Knowledge Level: Medium Comprehension Level: Medium Ability to understand written Medium instructions: Ability to understand verbal Medium instructions: Motivation Assessment Anxiety Level: Calm Cooperation: Cooperative Education Importance: Acknowledges Need Interest in Health Problems: Asks Questions Perception: Coherent Willingness to Engage in Self- High Management Activities: Electronic Signature(s) Signed: 03/10/2018 5:38:54 PM By: Gretta Cool, BSN, RN, CWS, Kim RN, BSN Entered By: Gretta Cool, BSN, RN, CWS, Kim on 03/10/2018 09:09:09 Amy [name], [name]  ([id number]) -------------------------------------------------------------------------------- Fall Risk Assessment Details Patient Name: Amy [name], [name] Date of Service: 03/10/2018 8:45 AM Medical Record Number: [id number] Patient Account Number: [id number] Date of Birth/Sex: 12/26/1935 (82 y.o. Female) Treating RN: Cornell Barman Primary Care Rashawn Rolon: Enid Derry Other Clinician: Referring Rosezella Kronick: Enid Derry Treating Briggitte Boline/Extender: Melburn Hake, HOYT Weeks in Treatment: 0 Fall Risk Assessment Items Have you had 2 or more falls in the last 12 monthso 0 Yes Have you had any fall that resulted in injury in the last 12 monthso 0 No FALL RISK ASSESSMENT: History of falling - immediate or within 3 months 0 No Secondary  diagnosis 0 No Ambulatory aid None/bed rest/wheelchair/nurse 0 Yes Crutches/cane/walker 0 No Furniture 0 No IV Access/Saline Lock 0 No Gait/Training Normal/bed rest/immobile 0 Yes Weak 0 No Impaired 0 No Mental Status Oriented to own ability 0 Yes Electronic Signature(s) Signed: 03/10/2018 5:38:54 PM By: Gretta Cool, BSN, RN, CWS, Kim RN, BSN Entered By: Gretta Cool, BSN, RN, CWS, Kim on 03/10/2018 09:09:28 Amy [name], [name] ([id number]) -------------------------------------------------------------------------------- Foot Assessment Details Patient Name: Amy [name], [name] Date of Service: 03/10/2018 8:45 AM Medical Record Number: [id number] Patient Account Number: [id number] Date of Birth/Sex: [birth year] (82 y.o. Female) Treating RN: Cornell Barman Primary Care Mireya Meditz: Enid Derry Other Clinician: Referring Hoang Pettingill: Enid Derry Treating Khloe Hunkele/Extender: Melburn Hake, HOYT Weeks in Treatment: 0 Foot Assessment Items Site Locations + = Sensation present, - = Sensation absent, C = Callus, U = Ulcer R = Redness, W = Warmth, M = Maceration, PU = Pre-ulcerative lesion F = Fissure, S = Swelling, D = Dryness Assessment Right: Left: Other Deformity: No No Prior  Foot Ulcer: No No Prior Amputation: Yes No Charcot Joint: No No Ambulatory Status: Non-ambulatory Assistance Device: Wheelchair Gait: Administrator, arts) Signed: 03/10/2018 5:38:54 PM By: Gretta Cool, BSN, RN, CWS, Kim RN, BSN Entered By: Gretta Cool, BSN, RN, CWS, Kim on 03/10/2018 09:10:26 Amy [name], [name] ([id number]) -------------------------------------------------------------------------------- Nutrition Risk Assessment Details Patient Name: Amy [name], [name] Date of Service: 03/10/2018 8:45 AM Medical Record Number: [id number] Patient Account Number: [id number] Date of Birth/Sex: 04/05/1936 (82 y.o. Female) Treating RN: Cornell Barman Primary Care Erick Murin: Enid Derry Other Clinician: Referring Karla Pavone: Enid Derry Treating Marcos Ruelas/Extender: Melburn Hake, HOYT Weeks in Treatment: 0 Height (in): 62 Weight (lbs): 184 Body Mass Index (BMI): 33.7 Nutrition Risk Assessment Items NUTRITION RISK SCREEN: I have an illness or condition that made me change the kind and/or amount of 0 No food I eat I eat fewer than two meals per day 0 No I eat few fruits and vegetables, or milk products 0 No I have three or more drinks of beer, liquor or wine almost every day 0 No I have tooth or mouth problems that make it hard for me to eat 0 No I don't always have enough money to buy the food I need 0 No I eat alone most of the time 0 No I take three or more different prescribed or over-the-counter drugs a day 1 Yes Without wanting to, I have lost or gained 10 pounds in the last six months 0 No I am not always physically able to shop, cook and/or feed myself 2 Yes Nutrition Protocols Good Risk Protocol Provide education on Moderate Risk Protocol 0 nutrition Electronic Signature(s) Signed: 03/10/2018 5:38:54 PM By: Gretta Cool, BSN, RN, CWS, Kim RN, BSN Entered By: Gretta Cool, BSN, RN, CWS, Kim on 03/10/2018 09:09:53

## 2018-03-17 ENCOUNTER — Encounter: Payer: Medicare HMO | Admitting: Physician Assistant

## 2018-03-17 DIAGNOSIS — I130 Hypertensive heart and chronic kidney disease with heart failure and stage 1 through stage 4 chronic kidney disease, or unspecified chronic kidney disease: Secondary | ICD-10-CM | POA: Diagnosis not present

## 2018-03-17 DIAGNOSIS — L98499 Non-pressure chronic ulcer of skin of other sites with unspecified severity: Secondary | ICD-10-CM | POA: Diagnosis not present

## 2018-03-17 DIAGNOSIS — E1151 Type 2 diabetes mellitus with diabetic peripheral angiopathy without gangrene: Secondary | ICD-10-CM | POA: Diagnosis not present

## 2018-03-17 DIAGNOSIS — L98419 Non-pressure chronic ulcer of buttock with unspecified severity: Secondary | ICD-10-CM | POA: Diagnosis not present

## 2018-03-17 DIAGNOSIS — E1122 Type 2 diabetes mellitus with diabetic chronic kidney disease: Secondary | ICD-10-CM | POA: Diagnosis not present

## 2018-03-17 DIAGNOSIS — Z09 Encounter for follow-up examination after completed treatment for conditions other than malignant neoplasm: Secondary | ICD-10-CM | POA: Diagnosis not present

## 2018-03-17 DIAGNOSIS — N189 Chronic kidney disease, unspecified: Secondary | ICD-10-CM | POA: Diagnosis not present

## 2018-03-17 DIAGNOSIS — R32 Unspecified urinary incontinence: Secondary | ICD-10-CM | POA: Diagnosis not present

## 2018-03-17 DIAGNOSIS — F0390 Unspecified dementia without behavioral disturbance: Secondary | ICD-10-CM | POA: Diagnosis not present

## 2018-03-17 DIAGNOSIS — E1136 Type 2 diabetes mellitus with diabetic cataract: Secondary | ICD-10-CM | POA: Diagnosis not present

## 2018-03-17 DIAGNOSIS — E669 Obesity, unspecified: Secondary | ICD-10-CM | POA: Diagnosis not present

## 2018-03-17 NOTE — Progress Notes (Addendum)
[name], [name] ([id number]) Visit Report for 03/10/2018 Allergy List Details Patient Name: Amy [name], Amy [name]. Date of Service: 03/10/2018 8:45 AM Medical Record Number: [id number] Patient Account Number: [id number] Date of Birth/Sex: [birth year] (82 y.o. F) Treating RN: Cornell Barman Primary Care Yevette Knust: Enid Derry Other Clinician: Referring Haylen Shelnutt: Enid Derry Treating Patton Swisher/Extender: Melburn Hake, HOYT Weeks in Treatment: 0 Allergies Active Allergies morphine penicillin Allergy Notes Electronic Signature(s) Signed: 03/10/2018 5:38:54 PM By: Gretta Cool, BSN, RN, CWS, Kim RN, BSN Entered By: Gretta Cool, BSN, RN, CWS, Kim on 03/10/2018 08:57:32 [name], [name] ([id number]) -------------------------------------------------------------------------------- Arrival Information Details Patient Name: Amy, [name] Date of Service: 03/10/2018 8:45 AM Medical Record Number: [id number] Patient Account Number: [id number] Date of Birth/Sex: Feb 05, 1936 (82 y.o. F) Treating RN: Cornell Barman Primary Care Donae Kueker: Enid Derry Other Clinician: Referring Basia Mcginty: Enid Derry Treating Amy [name]: Melburn Hake, HOYT Weeks in Treatment: 0 Visit Information Patient Arrived: Wheel Chair Arrival Time: 08:53 Accompanied By: husband Transfer Assistance: Manual Patient Identification Verified: Yes Secondary Verification Process Yes Completed: Patient Requires Transmission-Based No Precautions: Patient Has Alerts: Yes Patient Alerts: Patient on Blood Thinner Type II Diabetic ROOM 4/5 (Lift) Electronic Signature(s) Signed: 03/10/2018 10:04:12 AM By: Gretta Cool, BSN, RN, CWS, Kim RN, BSN Previous Signature: 03/10/2018 10:03:23 AM Version By: Gretta Cool, BSN, RN, CWS, Kim RN, BSN Entered By: Gretta Cool, BSN, RN, CWS, Kim on 03/10/2018 10:04:11 [name], [name] ([id number]) -------------------------------------------------------------------------------- Clinic Level of Care Assessment  Details Patient Name: Amy, [name] Date of Service: 03/10/2018 8:45 AM Medical Record Number: [id number] Patient Account Number: [id number] Date of Birth/Sex: 08/16/1935 (82 y.o. F) Treating RN: Cornell Barman Primary Care Marcus Schwandt: Enid Derry Other Clinician: Referring Janya Eveland: Enid Derry Treating Brighton Pilley/Extender: Melburn Hake, HOYT Weeks in Treatment: 0 Clinic Level of Care Assessment Items TOOL 2 Quantity Score []  - Use when only an EandM is performed on the INITIAL visit 0 ASSESSMENTS - Nursing Assessment / Reassessment X - General Physical Exam (combine w/ comprehensive assessment (listed just below) when 1 20 performed on new pt. evals) X- 1 25 Comprehensive Assessment (HX, ROS, Risk Assessments, Wounds Hx, etc.) ASSESSMENTS - Wound and Skin Assessment / Reassessment []  - Simple Wound Assessment / Reassessment - one wound 0 X- 2 5 Complex Wound Assessment / Reassessment - multiple wounds []  - 0 Dermatologic / Skin Assessment (not related to wound area) ASSESSMENTS - Ostomy and/or Continence Assessment and Care []  - Incontinence Assessment and Management 0 []  - 0 Ostomy Care Assessment and Management (repouching, etc.) PROCESS - Coordination of Care X - Simple Patient / Family Education for ongoing care 1 15 []  - 0 Complex (extensive) Patient / Family Education for ongoing care X- 1 10 Staff obtains Programmer, systems, Records, Test Results / Process Orders []  - 0 Staff telephones HHA, Nursing Homes / Clarify orders / etc []  - 0 Routine Transfer to another Facility (non-emergent condition) []  - 0 Routine Hospital Admission (non-emergent condition) X- 1 15 New Admissions / Biomedical engineer / Ordering NPWT, Apligraf, etc. []  - 0 Emergency Hospital Admission (emergent condition) X- 1 10 Simple Discharge Coordination []  - 0 Complex (extensive) Discharge Coordination PROCESS - Special Needs []  - Pediatric / Minor Patient Management 0 []  - 0 Isolation Patient  Management [name], [name] ([id number]) []  - 0 Hearing / Language / Visual special needs []  - 0 Assessment of Community assistance (transportation, D/C planning, etc.) []  - 0 Additional assistance / Altered mentation []  - 0 Support Surface(s) Assessment (bed, cushion, seat, etc.) INTERVENTIONS - Wound Cleansing / Measurement X -  Wound Imaging (photographs - any number of wounds) 1 5 []  - 0 Wound Tracing (instead of photographs) []  - 0 Simple Wound Measurement - one wound X- 2 5 Complex Wound Measurement - multiple wounds []  - 0 Simple Wound Cleansing - one wound X- 2 5 Complex Wound Cleansing - multiple wounds INTERVENTIONS - Wound Dressings []  - Small Wound Dressing one or multiple wounds 0 X- 1 15 Medium Wound Dressing one or multiple wounds []  - 0 Large Wound Dressing one or multiple wounds []  - 0 Application of Medications - injection INTERVENTIONS - Miscellaneous []  - External ear exam 0 []  - 0 Specimen Collection (cultures, biopsies, blood, body fluids, etc.) []  - 0 Specimen(s) / Culture(s) sent or taken to Lab for analysis X- 1 10 Patient Transfer (multiple staff / Civil Service fast streamer / Similar devices) []  - 0 Simple Staple / Suture removal (25 or less) []  - 0 Complex Staple / Suture removal (26 or more) []  - 0 Hypo / Hyperglycemic Management (close monitor of Blood Glucose) []  - 0 Ankle / Brachial Index (ABI) - do not check if billed separately Has the patient been seen at the hospital within the last three years: Yes Total Score: 155 Level Of Care: New/Established - Level 4 Electronic Signature(s) Signed: 03/10/2018 5:38:54 PM By: Gretta Cool, BSN, RN, CWS, Kim RN, BSN Entered By: Gretta Cool, BSN, RN, CWS, Kim on 03/10/2018 09:44:48 [name], [name] ([id number]) -------------------------------------------------------------------------------- Encounter Discharge Information Details Patient Name: Amy, [name] Date of Service: 03/10/2018 8:45 AM Medical  Record Number: [id number] Patient Account Number: [id number] Date of Birth/Sex: [birth year] (82 y.o. F) Treating RN: Cornell Barman Primary Care Griffey Nicasio: Enid Derry Other Clinician: Referring Shenequa Howse: Enid Derry Treating Avery Eustice/Extender: Melburn Hake, HOYT Weeks in Treatment: 0 Encounter Discharge Information Items Discharge Condition: Stable Ambulatory Status: Wheelchair Discharge Destination: Home Transportation: Private Auto Accompanied By: husband Schedule Follow-up Appointment: Yes Clinical Summary of Care: Electronic Signature(s) Signed: 03/10/2018 5:38:54 PM By: Gretta Cool, BSN, RN, CWS, Kim RN, BSN Entered By: Gretta Cool, BSN, RN, CWS, Kim on 03/10/2018 09:57:32 [name], [name] ([id number]) -------------------------------------------------------------------------------- Lower Extremity Assessment Details Patient Name: [name], [name] Date of Service: 03/10/2018 8:45 AM Medical Record Number: [id number] Patient Account Number: [id number] Date of Birth/Sex: 06/28/1935 (82 y.o. F) Treating RN: Cornell Barman Primary Care Tyrik Stetzer: Enid Derry Other Clinician: Referring Molleigh Huot: Enid Derry Treating Khara Renaud/Extender: Melburn Hake, HOYT Weeks in Treatment: 0 Electronic Signature(s) Signed: 03/10/2018 5:38:54 PM By: Gretta Cool, BSN, RN, CWS, Kim RN, BSN Entered By: Gretta Cool, BSN, RN, CWS, Kim on 03/10/2018 09:15:45 Hangartner, Sawyer L. ([id number]) -------------------------------------------------------------------------------- Multi Wound Chart Details Patient Name: MARKOV, Martina L. Date of Service: 03/10/2018 8:45 AM Medical Record Number: [id number] Patient Account Number: [id number] Date of Birth/Sex: 07/28/1935 (82 y.o. F) Treating RN: Cornell Barman Primary Care Maecyn Panning: Enid Derry Other Clinician: Referring Zurisadai Helminiak: Enid Derry Treating Brittney Mucha/Extender: Melburn Hake, HOYT Weeks in Treatment: 0 Vital Signs Height(in): 62 Pulse(bpm): 53 Weight(lbs): 184 Blood  Pressure(mmHg): 115/87 Body Mass Index(BMI): 34 Temperature(F): 97.7 Respiratory Rate 16 (breaths/min): Photos: [1:No Photos] [2:No Photos] [N/A:N/A] Wound Location: [1:Right Gluteus] [2:Left Gluteus] [N/A:N/A] Wounding Event: [1:Pressure Injury] [2:Gradually Appeared] [N/A:N/A] Primary Etiology: [1:Pressure Ulcer] [2:Pressure Ulcer] [N/A:N/A] Comorbid History: [1:Cataracts, Hypertension, Type II Diabetes, Osteoarthritis, Dementia] [2:Cataracts, Hypertension, Type II Diabetes, Osteoarthritis, Dementia] [N/A:N/A] Date Acquired: [1:02/17/2018] [2:02/17/2018] [N/A:N/A] Weeks of Treatment: [1:0] [2:0] [N/A:N/A] Wound Status: [1:Open] [2:Open] [N/A:N/A] Measurements L x W x D [1:0.3x0.4x0.1] [2:0.6x0.7x0.1] [N/A:N/A] (cm) Area (cm) : [1:0.094] [2:0.33] [N/A:N/A] Volume (cm) : [1:0.009] [2:0.033] [N/A:N/A] Classification: [1:Category/Stage I] [2:Category/Stage  II] [N/A:N/A] Exudate Amount: [1:None Present] [2:None Present] [N/A:N/A] Wound Margin: [1:Indistinct, nonvisible] [2:Indistinct, nonvisible] [N/A:N/A] Granulation Amount: [1:None Present (0%)] [2:None Present (0%)] [N/A:N/A] Necrotic Amount: [1:None Present (0%)] [2:None Present (0%)] [N/A:N/A] Exposed Structures: [1:Fascia: No Fat Layer (Subcutaneous Tissue) Exposed: No Tendon: No Muscle: No Joint: No Bone: No Limited to Skin Breakdown] [2:Fascia: No Fat Layer (Subcutaneous Tissue) Exposed: No Tendon: No Muscle: No Joint: No Bone: No Limited to Skin Breakdown]  [N/A:N/A] Epithelialization: [1:None] [2:Large (67-100%)] [N/A:N/A] Periwound Skin Texture: [1:Excoriation: No Induration: No Callus: No Crepitus: No Rash: No Scarring: No] [2:Excoriation: No Induration: No Callus: No Crepitus: No Rash: No Scarring: No] [N/A:N/A] Periwound Skin Moisture: [1:Dry/Scaly: Yes Maceration: No] [2:Dry/Scaly: Yes Maceration: No] [N/A:N/A] Periwound Skin Color: [N/A:N/A] Atrophie Blanche: No Atrophie Blanche: No Cyanosis: No Cyanosis:  No Ecchymosis: No Ecchymosis: No Erythema: No Erythema: No Hemosiderin Staining: No Hemosiderin Staining: No Mottled: No Mottled: No Pallor: No Pallor: No Rubor: No Rubor: No Temperature: No Abnormality No Abnormality N/A Tenderness on Palpation: Yes Yes N/A Wound Preparation: Ulcer Cleansing: Ulcer Cleansing: N/A Rinsed/Irrigated with Saline Rinsed/Irrigated with Saline Topical Anesthetic Applied: Topical Anesthetic Applied: None None Treatment Notes Electronic Signature(s) Signed: 03/10/2018 5:38:54 PM By: Gretta Cool, BSN, RN, CWS, Kim RN, BSN Entered By: Gretta Cool, BSN, RN, CWS, Kim on 03/10/2018 09:33:22 [name], [name] ([id number]) -------------------------------------------------------------------------------- [place] Details Patient Name: ALPHA, CHOUINARD. Date of Service: 03/10/2018 8:45 AM Medical Record Number: [id number] Patient Account Number: [id number] Date of Birth/Sex: [birth year] (82 y.o. F) Treating RN: Cornell Barman Primary Care Chantal Worthey: Enid Derry Other Clinician: Referring Crystallynn Noorani: Enid Derry Treating Duel Conrad/Extender: Melburn Hake, HOYT Weeks in Treatment: 0 Active Inactive ` Abuse / Safety / Falls / Self Care Management Nursing Diagnoses: History of Falls Potential for falls Goals: Patient will not experience any injury related to falls Date Initiated: 03/10/2018 Target Resolution Date: 04/10/2018 Goal Status: Active Interventions: Assess fall risk on admission and as needed Notes: ` Nutrition Nursing Diagnoses: Potential for alteratiion in Nutrition/Potential for imbalanced nutrition Goals: Patient/caregiver will maintain therapeutic glucose control Date Initiated: 03/10/2018 Target Resolution Date: 04/10/2018 Goal Status: Active Interventions: Assess patient nutrition upon admission and as needed per policy Notes: ` Orientation to the Wound Care Program Nursing Diagnoses: Knowledge deficit related to the  wound healing center program Goals: Patient/caregiver will verbalize understanding of the [place] Date Initiated: 03/10/2018 Target Resolution Date: 04/10/2018 Goal Status: Active [name], [name] ([id number]) Interventions: Provide education on orientation to the wound center Notes: ` Pressure Nursing Diagnoses: Knowledge deficit related to management of pressures ulcers Goals: Patient will remain free from development of additional pressure ulcers Date Initiated: 03/10/2018 Target Resolution Date: 04/10/2018 Goal Status: Active Interventions: Assess: immobility, friction, shearing, incontinence upon admission and as needed Provide education on pressure ulcers Notes: ` Wound/Skin Impairment Nursing Diagnoses: Impaired tissue integrity Goals: Ulcer/skin breakdown will have a volume reduction of 30% by week 4 Date Initiated: 03/10/2018 Target Resolution Date: 04/10/2018 Goal Status: Active Interventions: Assess ulceration(s) every visit Treatment Activities: Topical wound management initiated : 03/10/2018 Notes: Electronic Signature(s) Signed: 03/10/2018 5:38:54 PM By: Gretta Cool, BSN, RN, CWS, Kim RN, BSN Entered By: Gretta Cool, BSN, RN, CWS, Kim on 03/10/2018 09:32:52 [name], [name] ([id number]) -------------------------------------------------------------------------------- Pain Assessment Details Patient Name: PHELICIA, DANTES L. Date of Service: 03/10/2018 8:45 AM Medical Record Number: [id number] Patient Account Number: [id number] Date of Birth/Sex: 17-Mar-1936 (82 y.o. F) Treating RN: Cornell Barman Primary Care Jemmie Rhinehart: Enid Derry Other Clinician: Referring Benno Brensinger: Enid Derry Treating Jeter Tomey/Extender: Melburn Hake, HOYT  Weeks in Treatment: 0 Active Problems Location of Pain Severity and Description of Pain Patient Has Paino No Site Locations Pain Management and Medication Current Pain Management: Electronic Signature(s) Signed:  03/10/2018 5:38:54 PM By: Gretta Cool, BSN, RN, CWS, Kim RN, BSN Entered By: Gretta Cool, BSN, RN, CWS, Kim on 03/10/2018 08:54:45 [name], [name] ([id number]) -------------------------------------------------------------------------------- Patient/Caregiver Education Details Patient Name: SHAUNTE, TUFT L. Date of Service: 03/10/2018 8:45 AM Medical Record Number: [id number] Patient Account Number: [id number] Date of Birth/Gender: [birth year] (82 y.o. F) Treating RN: Cornell Barman Primary Care Physician: Enid Derry Other Clinician: Referring Physician: Enid Derry Treating Physician/Extender: Sharalyn Ink in Treatment: 0 Education Assessment Education Provided To: Patient Education Topics Provided Pressure: Handouts: Pressure Ulcers: Care and Offloading, Other: in depth conversation of imortance to get out of chair and into bed. Methods: Demonstration, Explain/Verbal Responses: State content correctly Welcome To The Portal: Handouts: Welcome To The [place] Methods: Demonstration, Explain/Verbal Responses: State content correctly Electronic Signature(s) Signed: 03/10/2018 5:38:54 PM By: Gretta Cool, BSN, RN, CWS, Kim RN, BSN Entered By: Gretta Cool, BSN, RN, CWS, Kim on 03/10/2018 09:46:10 [name], [name] ([id number]) -------------------------------------------------------------------------------- Wound Assessment Details Patient Name: CARR, Chalice L. Date of Service: 03/10/2018 8:45 AM Medical Record Number: [id number] Patient Account Number: [id number] Date of Birth/Sex: [birth year] (82 y.o. F) Treating RN: Cornell Barman Primary Care Shonika Kolasinski: Enid Derry Other Clinician: Referring Darrnell Mangiaracina: Enid Derry Treating Demtrius Rounds/Extender: Melburn Hake, HOYT Weeks in Treatment: 0 Wound Status Wound Number: 2 Primary Pressure Ulcer Etiology: Wound Location: Left Gluteus Wound Healed - Epithelialized Wounding Event: Pressure Injury Status: Date Acquired:  02/17/2018 Comorbid Cataracts, Anemia, Hypertension, Peripheral Weeks Of Treatment: 0 History: Arterial Disease, Type II Diabetes, Clustered Wound: No Osteoarthritis, Dementia Photos Wound Measurements Length: (cm) 0 % Redu Width: (cm) 0 % Redu Depth: (cm) 0 Epithe Area: (cm) 0 Tunne Volume: (cm) 0 Under ction in Area: ction in Volume: lialization: Large (67-100%) ling: No mining: No Wound Description Classification: Category/Stage II Foul O Wound Margin: Indistinct, nonvisible Slough Exudate Amount: None Present dor After Cleansing: No /Fibrino No Wound Bed Granulation Amount: None Present (0%) Exposed Structure Necrotic Amount: None Present (0%) Fascia Exposed: No Fat Layer (Subcutaneous Tissue) Exposed: No Tendon Exposed: No Muscle Exposed: No Joint Exposed: No Bone Exposed: No Limited to Skin Breakdown Periwound Skin Texture Texture Color No Abnormalities Noted: Yes No Abnormalities Noted: Yes Byer, Satia L. ([id number]) Moisture Temperature / Pain No Abnormalities Noted: No Temperature: No Abnormality Dry / Scaly: Yes Tenderness on Palpation: Yes Maceration: No Wound Preparation Ulcer Cleansing: Rinsed/Irrigated with Saline Topical Anesthetic Applied: None Treatment Notes Wound #2 (Left Gluteus) 1. Cleansed with: Cleanse wound with antibacterial soap and water 2. Anesthetic Topical Lidocaine 4% cream to wound bed prior to debridement 4. Dressing Applied: Prisma Ag 5. Secondary [place] Notes Te Electronic Signature(s) Signed: 04/03/2018 12:40:15 PM By: Gretta Cool, BSN, RN, CWS, Kim RN, BSN Previous Signature: 04/03/2018 12:37:36 PM Version By: Gretta Cool, BSN, RN, CWS, Kim RN, BSN Previous Signature: 03/10/2018 5:38:54 PM Version By: Gretta Cool, BSN, RN, CWS, Kim RN, BSN Entered By: Gretta Cool, BSN, RN, CWS, Kim on 04/03/2018 12:40:15 Suits, Aleyda L.  ([id number]) -------------------------------------------------------------------------------- Wound Assessment Details Patient Name: KORPI, Jalissa L. Date of Service: 03/10/2018 8:45 AM Medical Record Number: [id number] Patient Account Number: [id number] Date of Birth/Sex: [birth year] (82 y.o. F) Treating RN: Cornell Barman Primary Care Imaad Reuss: Enid Derry Other Clinician: Referring Joye Wesenberg: Enid Derry Treating Barb Shear/Extender: Melburn Hake, HOYT Weeks in Treatment: 0 Wound Status Wound Number: 3  Primary MASD o Moisture Associated Skin Damage Etiology: Wound Location: Abdomen - Upper Quadrant - Medial Wound Open Wounding Event: Shear/Friction Status: Date Acquired: 03/09/2018 Comorbid Cataracts, Anemia, Hypertension, Peripheral Weeks Of Treatment: 0 History: Arterial Disease, Type II Diabetes, Clustered Wound: Yes Osteoarthritis, Dementia Wound Measurements Length: (cm) 0.5 Width: (cm) 15.5 Depth: (cm) 0.1 Area: (cm) 6.087 Volume: (cm) 0.609 % Reduction in Area: 0% % Reduction in Volume: 0% Epithelialization: None Tunneling: No Undermining: No Wound Description Wound Margin: Flat and Intact Exudate Amount: Small Exudate Type: Serous Exudate Color: amber Foul Odor After Cleansing: No Slough/Fibrino No Wound Bed Granulation Amount: None Present (0%) Exposed Structure Necrotic Amount: None Present (0%) Fascia Exposed: No Fat Layer (Subcutaneous Tissue) Exposed: No Tendon Exposed: No Muscle Exposed: No Joint Exposed: No Bone Exposed: No Limited to Skin Breakdown Periwound Skin Texture Texture Color No Abnormalities Noted: No No Abnormalities Noted: No Callus: No Atrophie Blanche: No Crepitus: No Cyanosis: No Excoriation: Yes Ecchymosis: No Induration: No Erythema: No Rash: No Hemosiderin Staining: No Scarring: No Mottled: No Pallor: No Moisture Rubor: No No Abnormalities Noted: No Dry / Scaly: No Maceration: No Odonohue, Zoriana L.  ([id number]) Wound Preparation Topical Anesthetic Applied: None Electronic Signature(s) Signed: 03/10/2018 5:38:54 PM By: Gretta Cool, BSN, RN, CWS, Kim RN, BSN Entered By: Gretta Cool, BSN, RN, CWS, Kim on 03/10/2018 09:37:16 Paxson, Codi L. ([id number]) -------------------------------------------------------------------------------- Vitals Details Patient Name: HAILEIGH, PITZ L. Date of Service: 03/10/2018 8:45 AM Medical Record Number: [id number] Patient Account Number: [id number] Date of Birth/Sex: April 11, 1936 (82 y.o. F) Treating RN: Cornell Barman Primary Care Shizuye Rupert: Enid Derry Other Clinician: Referring Amaria Mundorf: Enid Derry Treating Jenea Dake/Extender: Melburn Hake, HOYT Weeks in Treatment: 0 Vital Signs Time Taken: 08:54 Temperature (F): 97.7 Height (in): 62 Pulse (bpm): 53 Weight (lbs): 184 Respiratory Rate (breaths/min): 16 Body Mass Index (BMI): 33.7 Blood Pressure (mmHg): 115/87 Reference Range: 80 - 120 mg / dl Electronic Signature(s) Signed: 03/10/2018 5:38:54 PM By: Gretta Cool, BSN, RN, CWS, Kim RN, BSN Entered By: Gretta Cool, BSN, RN, CWS, Kim on 03/10/2018 08:55:14

## 2018-03-17 NOTE — Progress Notes (Addendum)
Spanos, Josalynn L. ([id number]) Visit Report for 03/10/2018 Chief Complaint Document Details Patient Name: Amy [name], Amy [name]. Date of Service: 03/10/2018 8:45 AM Medical Record Number: [id number] Patient Account Number: [id number] Date of Birth/Sex: July 18, 1935 (82 y.o. F) Treating RN: Montey Hora Primary Care Provider: Enid Derry Other Clinician: Referring Provider: Enid Derry Treating Provider/Extender: Melburn Hake, Riggin Cuttino Weeks in Treatment: 0 Information Obtained from: Patient Chief Complaint Bilateral gluteal ulcers and abdominal ulcer Electronic Signature(s) Signed: 03/16/2018 1:45:00 AM By: Worthy Keeler PA-C Entered By: Worthy Keeler on 03/10/2018 09:50:10 Yapp, Mixtli L. ([id number]) -------------------------------------------------------------------------------- HPI Details Patient Name: [name], [name] Date of Service: 03/10/2018 8:45 AM Medical Record Number: [id number] Patient Account Number: [id number] Date of Birth/Sex: [birth year] (82 y.o. F) Treating RN: Montey Hora Primary Care Provider: Enid Derry Other Clinician: Referring Provider: Enid Derry Treating Provider/Extender: Melburn Hake, Braydan Marriott Weeks in Treatment: 0 History of Present Illness Associated Signs and Symptoms: The patient has a history of chronic kidney disease, diabetes mellitus type II although her hemoglobin A1c most recent was 5.8 and she is on the medications. 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 She is incontinent. HPI Description: 03/10/18 on evaluation today patient actually presents for evaluation concerning her bilateral gluteal ulcers which have been present for several weeks. She has been treated over time with different creams most recently polysporin. With that being said the patient actually seems to be doing somewhat better at  this point in time during evaluation today. Nonetheless she still does have an opening mainly on the left side the right pretty much appears to be closed in my opinion. She does have pain on the left gluteal region. She also tells me about an area under the abdominal fold in the lower abdominal region which appears to be new just yesterday or today. This appears to potentially be a little bit of a fungal rash versus possibly friction from the way her husband has to help her with transfer. No fevers, chills, nausea, or vomiting noted at this time. Patient has dementia and has a very difficult time sometimes following directions. Her husband does the best he can taking care for home but he states is difficult sometimes to get her to do anything but just sit either in her wheelchair or the recliner which is probably why she's having such issues with her gluteal region. Nonetheless considering how much time she seems to spend sitting I'm pleased that the wounds are not any worse in appearance. Electronic Signature(s) Signed: 03/16/2018 1:45:00 AM By: Worthy Keeler PA-C Entered By: Worthy Keeler on 03/10/2018 09:52:40 [name], [name] ([id number]) -------------------------------------------------------------------------------- Physical Exam Details Patient Name: [name], [name] Date of Service: 03/10/2018 8:45 AM Medical Record Number: [id number] Patient Account Number: [id number] Date of Birth/Sex: 1936/01/07 (82 y.o. F) Treating RN: Montey Hora Primary Care Provider: Enid Derry Other Clinician: Referring Provider: Enid Derry Treating Provider/Extender: Melburn Hake, Euline Kimbler Weeks in Treatment: 0 Constitutional patient is hypertensive.. respirations regular, non-labored and within target range for patient.[name] temperature within target range for patient.. Well-nourished and well-hydrated in no acute distress. Eyes conjunctiva clear no eyelid edema noted. pupils equal round and  reactive to light and accommodation. Ears, Nose, Mouth, and Throat no gross abnormality of ear auricles or external auditory canals. normal hearing noted during conversation. mucus membranes moist. Respiratory normal breathing without difficulty. clear to auscultation bilaterally. Cardiovascular regular rate  and rhythm with normal S1, S2. 1+ dorsalis pedis/posterior tibialis pulses of the Right LE. trace pitting edema of the right lower extremity. Gastrointestinal (GI) soft, non-tender, non-distended, +BS. no ventral hernia noted. Musculoskeletal Patient unable to walk without assistance. Let AKA. Psychiatric Patient is not able to cooperate in decision making regarding care. Patient is oriented to person only. pleasant and cooperative. Notes On evaluation today patient's wounds did not require any sharp debridement at this point. Fortunately there's no evidence of infection either which is good news. In general I feel like that the gluteal wounds could heal very well but pressure is going to be a key factor as far as getting them to heal and getting them to stay healed. In regard to the lower abdominal area this is most likely more of a friction/moisture associated skin breakdown currently. There may be a fungal component to this. Electronic Signature(s) Signed: 03/16/2018 1:45:00 AM By: Worthy Keeler PA-C Entered By: Worthy Keeler on 03/10/2018 12:59:03 [name], [name] ([id number]) -------------------------------------------------------------------------------- Physician Orders Details Patient Name: DACY, ENRICO L. Date of Service: 03/10/2018 8:45 AM Medical Record Number: [id number] Patient Account Number: [id number] Date of Birth/Sex: 1935-12-15 (82 y.o. F) Treating RN: Cornell Barman Primary Care Provider: Enid Derry Other Clinician: Referring Provider: Enid Derry Treating Provider/Extender: Melburn Hake, Kayleanna Lorman Weeks in Treatment: 0 Verbal / Phone Orders:  No Diagnosis Coding Wound Cleansing Wound #2 Left Gluteus o Cleanse wound with mild soap and water - Pat dry. Wound #3 Medial Abdomen - Upper Quadrant o Cleanse wound with mild soap and water - Pat dry. Skin Barriers/Peri-Wound Care Wound #3 Medial Abdomen - Upper Quadrant o Other: - Nystatin and Zinc Primary Wound Dressing Wound #2 Left Gluteus o Silver Collagen Secondary Dressing Wound #2 Left Gluteus o Boardered Foam Dressing Dressing Change Frequency Wound #2 Left Gluteus o Change Dressing Monday, Wednesday, Friday - or more often as needed. Wound #3 Medial Abdomen - Upper Quadrant o Change dressing every day. Follow-up Appointments Wound #2 Left Gluteus o Return Appointment in 1 week. Wound #3 Medial Abdomen - Upper Quadrant o Return Appointment in 1 week. Off-Loading Wound #2 Left Gluteus o Turn and reposition every 2 hours Medications-please add to medication list. Wound #2 Left Gluteus o Other: - Nystatin Cream and zinc oxide on abdomen area. Wound #3 Medial Abdomen - Upper Quadrant Brickley, [name] ([id number]) o Other: - Nystatin Cream and zinc oxide on abdomen area. Patient Medications Allergies: morphine, penicillin Notifications Medication Indication Start End nystatin 03/10/2018 DOSE 0 - topical 100,000 unit/gram cream - 0 cream topical mixed 1:1 with zinc oxide then applied to the lower abdominal region 2 times a day until healed Electronic Signature(s) Signed: 03/10/2018 5:38:54 PM By: Gretta Cool, BSN, RN, CWS, Kim RN, BSN Signed: 03/16/2018 1:45:00 AM By: Worthy Keeler PA-C Previous Signature: 03/10/2018 9:54:52 AM Version By: Worthy Keeler PA-C Entered By: Gretta Cool, BSN, RN, CWS, Kim on 03/10/2018 09:58:45 Reicks, [name] ([id number]) -------------------------------------------------------------------------------- Problem List Details Patient Name: STANGER, [name] Date of Service: 03/10/2018 8:45 AM Medical Record  Number: [id number] Patient Account Number: [id number] Date of Birth/Sex: Sep 10, 1935 (82 y.o. F) Treating RN: Montey Hora Primary Care Provider: Enid Derry Other Clinician: Referring Provider: Enid Derry Treating Provider/Extender: Melburn Hake, Davine Sweney Weeks in Treatment: 0 Active Problems ICD-10 Evaluated Encounter Code Description Active Date Today Diagnosis L89.312 Pressure ulcer of right buttock, stage 2 03/10/2018 No Yes L89.322 Pressure ulcer of left buttock, stage 2 03/10/2018 No Yes L98.8 Other specified disorders of the  skin and subcutaneous 03/10/2018 No Yes tissue E11.622 Type 2 diabetes mellitus with other skin ulcer 03/10/2018 No Yes N18.9 Chronic kidney disease, unspecified 03/10/2018 No Yes I10 Essential (primary) hypertension 03/10/2018 No Yes I73.9 Peripheral vascular disease, unspecified 03/10/2018 No Yes Inactive Problems Resolved Problems Electronic Signature(s) Signed: 03/16/2018 1:45:00 AM By: Worthy Keeler PA-C Entered By: Worthy Keeler on 03/10/2018 09:49:40 Pica, [name] ([id number]) -------------------------------------------------------------------------------- Progress Note Details Patient Name: Amy [name], [name] Date of Service: 03/10/2018 8:45 AM Medical Record Number: [id number] Patient Account Number: [id number] Date of Birth/Sex: [birth year] (82 y.o. F) Treating RN: Montey Hora Primary Care Provider: Enid Derry Other Clinician: Referring Provider: Enid Derry Treating Provider/Extender: Melburn Hake, Annaya Bangert Weeks in Treatment: 0 Subjective Chief Complaint Information obtained from Patient Bilateral gluteal ulcers and abdominal ulcer History of Present Illness (HPI) The following HPI elements were documented for the patient's wound: Associated Signs and Symptoms: The patient has a history of chronic kidney disease, diabetes mellitus type II although her hemoglobin A1c most recent was 5.8 and she is on the medications. 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 She is incontinent. 03/10/18 on evaluation today patient actually presents for evaluation concerning her bilateral gluteal ulcers which have been present for several weeks. She has been treated over time with different creams most recently polysporin. With that being said the patient actually seems to be doing somewhat better at this point in time during evaluation today. Nonetheless she still does have an opening mainly on the left side the right pretty much appears to be closed in my opinion. She does have pain on the left gluteal region. She also tells me about an area under the abdominal fold in the lower abdominal region which appears to be new just yesterday or today. This appears to potentially be a little bit of a fungal rash versus possibly friction from the way her husband has to help her with transfer. No fevers, chills, nausea, or vomiting noted at this time. Patient has dementia and has a very difficult time sometimes following directions. Her husband does the best he can taking care for home but he states is difficult sometimes to get her to do anything but just sit either in her wheelchair or the recliner which is probably why she's having such issues with her gluteal region. Nonetheless considering how much time she seems to spend sitting I'm pleased that the wounds are not any worse in appearance. Wound History Patient presents with 2 open wounds that have been present for approximately 3 weeks. Patient has been treating wounds in the following manner: aloe vista and polysporin. Laboratory tests have not been performed in the last month. Patient reportedly has not tested positive for an antibiotic resistant organism. Patient reportedly has not tested positive for osteomyelitis. Patient reportedly has had testing  performed to evaluate circulation in the legs. Patient History Unable to Obtain Patient History due to Dementia. Information obtained from Patient, Caregiver. Allergies morphine, penicillin Family History Cancer - Siblings,Mother, Diabetes - Siblings, No family history of Heart Disease, Hypertension, Kidney Disease, Lung Disease, Seizures, Stroke, Thyroid Problems, Tuberculosis. Social History Former smoker, Marital Status - Married, Alcohol Use - Rarely, Drug Use - No History, Caffeine Use - Daily. Fernando, Brittinie L. ([id number]) Medical History Eyes Patient has history of Cataracts Denies history of Glaucoma, Optic Neuritis Ear/Nose/Mouth/Throat Denies history of Chronic sinus problems/congestion, Middle  ear problems Hematologic/Lymphatic Patient has history of Anemia Denies history of Hemophilia, Human Immunodeficiency Virus, Lymphedema Respiratory Denies history of Aspiration, Asthma, Chronic Obstructive Pulmonary Disease (COPD), Pneumothorax, Sleep Apnea, Tuberculosis Cardiovascular Patient has history of Hypertension, Peripheral Arterial Disease Denies history of Angina, Arrhythmia, Congestive Heart Failure, Coronary Artery Disease, Deep Vein Thrombosis, Hypotension, Myocardial Infarction, Peripheral Venous Disease, Phlebitis, Vasculitis Gastrointestinal Denies history of Cirrhosis , Colitis, Crohn s, Hepatitis A, Hepatitis B, Hepatitis C Endocrine Patient has history of Type II Diabetes Denies history of Type I Diabetes Genitourinary Denies history of End Stage Renal Disease Immunological Denies history of Lupus Erythematosus, Raynaud s, Scleroderma Integumentary (Skin) Denies history of History of Burn, History of pressure wounds Musculoskeletal Patient has history of Osteoarthritis - hands Denies history of Gout, Rheumatoid Arthritis, Osteomyelitis Neurologic Patient has history of Dementia Denies history of Neuropathy, Quadriplegia, Paraplegia, Seizure  Disorder Oncologic Denies history of Received Chemotherapy, Received Radiation Psychiatric Denies history of Anorexia/bulimia, Confinement Anxiety Patient is treated with Controlled Diet. Blood sugar is not tested. Medical And Surgical History Notes Respiratory Nebulizer Genitourinary Left Kidney Removed Oncologic Left Kidney Review of Systems (ROS) Constitutional Symptoms (General Health) The patient has no complaints or symptoms. Eyes The patient has no complaints or symptoms. Ear/Nose/Mouth/Throat Complains or has symptoms of Sinusitis. Denies complaints or symptoms of Difficult clearing ears. Hematologic/Lymphatic The patient has no complaints or symptoms. Respiratory Rijos, [name] ([id number]) Complains or has symptoms of Shortness of Breath. Denies complaints or symptoms of Chronic or frequent coughs. Cardiovascular Complains or has symptoms of LE edema. Denies complaints or symptoms of Chest pain. Gastrointestinal Complains or has symptoms of Frequent diarrhea. Denies complaints or symptoms of Nausea, Vomiting. Endocrine The patient has no complaints or symptoms. Genitourinary Complains or has symptoms of Incontinence/dribbling - Chronic Kidney Disease. Denies complaints or symptoms of Kidney failure/ Dialysis. Immunological Complains or has symptoms of Itching. Denies complaints or symptoms of Hives. Integumentary (Skin) Complains or has symptoms of Wounds, Bleeding or bruising tendency. Denies complaints or symptoms of Breakdown, Swelling. Musculoskeletal The patient has no complaints or symptoms. Neurologic The patient has no complaints or symptoms. Oncologic The patient has no complaints or symptoms. Psychiatric The patient has no complaints or symptoms. Objective Constitutional patient is hypertensive.. respirations regular, non-labored and within target range for patient.[name] temperature within target range for patient.. Well-nourished and  well-hydrated in no acute distress. Vitals Time Taken: 8:54 AM, Height: 62 in, Weight: 184 lbs, BMI: 33.7, Temperature: 97.7 F, Pulse: 53 bpm, Respiratory Rate: 16 breaths/min, Blood Pressure: 115/87 mmHg. Eyes conjunctiva clear no eyelid edema noted. pupils equal round and reactive to light and accommodation. Ears, Nose, Mouth, and Throat no gross abnormality of ear auricles or external auditory canals. normal hearing noted during conversation. mucus membranes moist. Respiratory normal breathing without difficulty. clear to auscultation bilaterally. Cardiovascular regular rate and rhythm with normal S1, S2. 1+ dorsalis pedis/posterior tibialis pulses of the Right LE. trace pitting edema of the right lower extremity. Minerd, Ammanda L. ([id number]) Gastrointestinal (GI) soft, non-tender, non-distended, +BS. no ventral hernia noted. Musculoskeletal Patient unable to walk without assistance. Let AKA. Psychiatric Patient is not able to cooperate in decision making regarding care. Patient is oriented to person only. pleasant and cooperative. General Notes: On evaluation today patient's wounds did not require any sharp debridement at this point. Fortunately there's no evidence of infection either which is good news. In general I feel like that the gluteal wounds could heal very well but pressure is going to be a key  factor as far as getting them to heal and getting them to stay healed. In regard to the lower abdominal area this is most likely more of a friction/moisture associated skin breakdown currently. There may be a fungal component to this. Integumentary (Hair, Skin) Wound #2 status is Open. Original cause of wound was a pressure ulcer. The wound is located on the Left Gluteus. The wound measures 0.6cm length x 0.7cm width x 0.1cm depth; 0.33cm^2 area and 0.033cm^3 volume. The wound is limited to skin breakdown and is a stage II pressure ulcer. There is no tunneling or undermining  noted. There is a none present amount of drainage noted. The wound margin is indistinct and nonvisible. There is no granulation within the wound bed. There is no necrotic tissue within the wound bed. The periwound skin appearance had no abnormalities noted for texture. The periwound skin appearance had no abnormalities noted for color. The periwound skin appearance exhibited: Dry/Scaly. The periwound skin appearance did not exhibit: Maceration. Periwound temperature was noted as No Abnormality. The periwound has tenderness on palpation. Wound #3 status is Open. Original cause of wound was Shear/Friction. The wound is located on the Medial Abdomen - Upper Quadrant. The wound measures 0.5cm length x 15.5cm width x 0.1cm depth; 6.087cm^2 area and 0.609cm^3 volume. The wound is limited to skin breakdown. There is no tunneling or undermining noted. There is a small amount of serous drainage noted. The wound margin is flat and intact. There is no granulation within the wound bed. There is no necrotic tissue within the wound bed. The periwound skin appearance exhibited: Excoriation. The periwound skin appearance did not exhibit: Callus, Crepitus, Induration, Rash, Scarring, Dry/Scaly, Maceration, Atrophie Blanche, Cyanosis, Ecchymosis, Hemosiderin Staining, Mottled, Pallor, Rubor, Erythema. Assessment Active Problems ICD-10 Pressure ulcer of right buttock, stage 2 Pressure ulcer of left buttock, stage 2 Other specified disorders of the skin and subcutaneous tissue Type 2 diabetes mellitus with other skin ulcer Chronic kidney disease, unspecified Essential (primary) hypertension Peripheral vascular disease, unspecified Plan Amy [name], [name] ([id number]) Wound Cleansing: Wound #2 Left Gluteus: Cleanse wound with mild soap and water - Pat dry. Wound #3 Medial Abdomen - Upper Quadrant: Cleanse wound with mild soap and water - Pat dry. Skin Barriers/Peri-Wound Care: Wound #3 Medial Abdomen -  Upper Quadrant: Other: - Nystatin and Zinc Primary Wound Dressing: Wound #2 Left Gluteus: Silver Collagen Secondary Dressing: Wound #2 Left Gluteus: Boardered Foam Dressing Dressing Change Frequency: Wound #2 Left Gluteus: Change Dressing Monday, Wednesday, Friday - or more often as needed. Wound #3 Medial Abdomen - Upper Quadrant: Change dressing every day. Follow-up Appointments: Wound #2 Left Gluteus: Return Appointment in 1 week. Wound #3 Medial Abdomen - Upper Quadrant: Return Appointment in 1 week. Off-Loading: Wound #2 Left Gluteus: Turn and reposition every 2 hours Medications-please add to medication list.: Wound #2 Left Gluteus: Other: - Nystatin Cream and zinc oxide on abdomen area. Wound #3 Medial Abdomen - Upper Quadrant: Other: - Nystatin Cream and zinc oxide on abdomen area. The following medication(s) was prescribed: nystatin topical 100,000 unit/gram cream 0 0 cream topical mixed 1:1 with zinc oxide then applied to the lower abdominal region 2 times a day until healed starting 03/10/2018 I'm gonna suggest currently that we go ahead and initiate the above wound care measures for the next week. The patient is in agreement the plan and I did discuss this in great detail with her along with the patient's husband who takes care of her currently. The biggest thing is  going to be sticking to an appropriate offloading regimen which I think is gonna be the hardest thing considering her dementia. I tried to explain as much as I could to her that again the question is how much she is going to remember. We will see were things stand in one weeks time. Please see above for specific wound care orders. We will see patient for re-evaluation in 1 week(s) here in the clinic. If anything worsens or changes patient will contact our office for additional recommendations. Electronic Signature(s) Signed: 04/03/2018 1:05:32 PM By: Worthy Keeler PA-C Previous Signature: 03/16/2018  1:45:00 AM Version By: Worthy Keeler PA-C Entered By: Worthy Keeler on 04/03/2018 13:04:32 Lockner, [name] ([id number]) -------------------------------------------------------------------------------- ROS/PFSH Details Patient Name: Amy [name], SACHS L. Date of Service: 03/10/2018 8:45 AM Medical Record Number: [id number] Patient Account Number: [id number] Date of Birth/Sex: 04/04/1936 (82 y.o. F) Treating RN: Cornell Barman Primary Care Provider: Enid Derry Other Clinician: Referring Provider: Enid Derry Treating Provider/Extender: Melburn Hake, Latonia Conrow Weeks in Treatment: 0 Unable to Obtain Patient History due to oo Dementia Information Obtained From Patient Caregiver Wound History Do you currently have one or more open woundso Yes How many open wounds do you currently haveo 2 Approximately how long have you had your woundso 3 weeks How have you been treating your wound(s) until nowo aloe vista and polysporin Has your wound(s) ever healed and then re-openedo No Have you had any lab work done in the past montho No Have you tested positive for an antibiotic resistant organism (MRSA, VRE)o No Have you tested positive for osteomyelitis (bone infection)o No Have you had any tests for circulation on your legso Yes Where was the test doneo [facility] Ear/Nose/Mouth/Throat Complaints and Symptoms: Positive for: Sinusitis Negative for: Difficult clearing ears Medical History: Negative for: Chronic sinus problems/congestion; Middle ear problems Respiratory Complaints and Symptoms: Positive for: Shortness of Breath Negative for: Chronic or frequent coughs Medical History: Negative for: Aspiration; Asthma; Chronic Obstructive Pulmonary Disease (COPD); Pneumothorax; Sleep Apnea; Tuberculosis Past Medical History Notes: Nebulizer Cardiovascular Complaints and Symptoms: Positive for: LE edema Negative for: Chest pain Medical History: Positive for: Hypertension; Peripheral Arterial  Disease Negative for: Angina; Arrhythmia; Congestive Heart Failure; Coronary Artery Disease; Deep Vein Thrombosis; Hypotension; Myocardial Infarction; Peripheral Venous Disease; Phlebitis; Vasculitis Kleckner, [name] ([id number]) Gastrointestinal Complaints and Symptoms: Positive for: Frequent diarrhea Negative for: Nausea; Vomiting Medical History: Negative for: Cirrhosis ; Colitis; Crohnos; Hepatitis A; Hepatitis B; Hepatitis C Genitourinary Complaints and Symptoms: Positive for: Incontinence/dribbling - Chronic Kidney Disease Negative for: Kidney failure/ Dialysis Medical History: Negative for: End Stage Renal Disease Past Medical History Notes: Left Kidney Removed Immunological Complaints and Symptoms: Positive for: Itching Negative for: Hives Medical History: Negative for: Lupus Erythematosus; Raynaudos; Scleroderma Integumentary (Skin) Complaints and Symptoms: Positive for: Wounds; Bleeding or bruising tendency Negative for: Breakdown; Swelling Medical History: Negative for: History of Burn; History of pressure wounds Constitutional Symptoms (General Health) Complaints and Symptoms: No Complaints or Symptoms Eyes Complaints and Symptoms: No Complaints or Symptoms Medical History: Positive for: Cataracts Negative for: Glaucoma; Optic Neuritis Hematologic/Lymphatic Complaints and Symptoms: No Complaints or Symptoms Medical History: Positive for: Anemia Negative for: Hemophilia; Human Immunodeficiency Virus; Lymphedema Piccini, [name] ([id number]) Endocrine Complaints and Symptoms: No Complaints or Symptoms Medical History: Positive for: Type II Diabetes Negative for: Type I Diabetes Time with diabetes: 7 years Treated with: Diet Blood sugar tested every day: No Musculoskeletal Complaints and Symptoms: No Complaints or Symptoms Medical History: Positive for: Osteoarthritis - hands  Negative for: Gout; Rheumatoid Arthritis;  Osteomyelitis Neurologic Complaints and Symptoms: No Complaints or Symptoms Medical History: Positive for: Dementia Negative for: Neuropathy; Quadriplegia; Paraplegia; Seizure Disorder Oncologic Complaints and Symptoms: No Complaints or Symptoms Medical History: Negative for: Received Chemotherapy; Received Radiation Past Medical History Notes: Left Kidney Psychiatric Complaints and Symptoms: No Complaints or Symptoms Medical History: Negative for: Anorexia/bulimia; Confinement Anxiety HBO Extended History Items Eyes: Cataracts Immunizations Pneumococcal Vaccine: Received Pneumococcal Vaccination: Yes Implantable Devices Schuelke, [name] ([id number]) Family and Social History Cancer: Yes - Siblings,Mother; Diabetes: Yes - Siblings; Heart Disease: No; Hypertension: No; Kidney Disease: No; Lung Disease: No; Seizures: No; Stroke: No; Thyroid Problems: No; Tuberculosis: No; Former smoker; Marital Status - Married; Alcohol Use: Rarely; Drug Use: No History; Caffeine Use: Daily; Advanced Directives: No; Patient does not want information on Advanced Directives; Living Will: No; Medical Power of Attorney: No Electronic Signature(s) Signed: 03/10/2018 5:38:54 PM By: Gretta Cool, BSN, RN, CWS, Kim RN, BSN Signed: 03/16/2018 1:45:00 AM By: Worthy Keeler PA-C Entered By: Gretta Cool, BSN, RN, CWS, Kim on 03/10/2018 09:21:59 Newsom, [name] ([id number]) -------------------------------------------------------------------------------- SuperBill Details Patient Name: FLOREE, ZUNIGA L. Date of Service: 03/10/2018 Medical Record Number: [id number] Patient Account Number: [id number] Date of Birth/Sex: 12-20-1935 (82 y.o. F) Treating RN: Montey Hora Primary Care Provider: Enid Derry Other Clinician: Referring Provider: Enid Derry Treating Provider/Extender: Melburn Hake, Ayushi Pla Weeks in Treatment: 0 Diagnosis Coding ICD-10 Codes Code Description [phone] Pressure ulcer of right  buttock, stage 2 L89.322 Pressure ulcer of left buttock, stage 2 L98.8 Other specified disorders of the skin and subcutaneous tissue E11.622 Type 2 diabetes mellitus with other skin ulcer N18.9 Chronic kidney disease, unspecified I10 Essential (primary) hypertension I73.9 Peripheral vascular disease, unspecified Facility Procedures CPT4 Code: [id number] Description: 99214 - WOUND CARE VISIT-LEV 4 EST PT Modifier: Quantity: 1 Physician Procedures CPT4 Code: [id number] Description: 11031 - WC PHYS LEVEL 4 - NEW PT ICD-10 Diagnosis Description R94.585 Pressure ulcer of right buttock, stage 2 L89.322 Pressure ulcer of left buttock, stage 2 L98.8 Other specified disorders of the skin and subcutaneous ti E11.622 Type 2  diabetes mellitus with other skin ulcer Modifier: ssue Quantity: 1 Electronic Signature(s) Signed: 03/16/2018 1:45:00 AM By: Worthy Keeler PA-C Entered By: Worthy Keeler on 03/10/2018 13:00:46

## 2018-03-19 NOTE — Progress Notes (Signed)
Turri, Kushi L. ([id number]) Visit Report for 03/17/2018 Chief Complaint Document Details Patient Name: Amy [name], Amy [name]. Date of Service: 03/17/2018 2:00 PM Medical Record Number: [id number] Patient Account Number: [id number] Date of Birth/Sex: 06-11-1935 (82 y.o. F) Treating RN: Montey Hora Primary Care Provider: Enid Derry Other Clinician: Referring Provider: Enid Derry Treating Provider/Extender: Melburn Hake, HOYT Weeks in Treatment: 1 Information Obtained from: Patient Chief Complaint Bilateral gluteal ulcers and abdominal ulcer Electronic Signature(s) Signed: 03/17/2018 6:09:48 PM By: Worthy Keeler PA-C Entered By: Worthy Keeler on 03/17/2018 14:05:05 [name], [name] ([id number]) -------------------------------------------------------------------------------- HPI Details Patient Name: [name], [name] Date of Service: 03/17/2018 2:00 PM Medical Record Number: [id number] Patient Account Number: [id number] Date of Birth/Sex: February 17, 1936 (82 y.o. F) Treating RN: Montey Hora Primary Care Provider: Enid Derry Other Clinician: Referring Provider: Enid Derry Treating Provider/Extender: Melburn Hake, HOYT Weeks in Treatment: 1 History of Present Illness Associated Signs and Symptoms: The patient has a history of chronic kidney disease, diabetes mellitus type II although her hemoglobin A1c most recent was 5.8 and she is on the medications. 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 She is incontinent. HPI Description: 03/10/18 on evaluation today patient actually presents for evaluation concerning her bilateral gluteal ulcers which have been present for several weeks. She has been treated over time with different creams most recently polysporin. With that being said the patient actually seems to be doing somewhat better at  this point in time during evaluation today. Nonetheless she still does have an opening mainly on the left side the right pretty much appears to be closed in my opinion. She does have pain on the left gluteal region. She also tells me about an area under the abdominal fold in the lower abdominal region which appears to be new just yesterday or today. This appears to potentially be a little bit of a fungal rash versus possibly friction from the way her husband has to help her with transfer. No fevers, chills, nausea, or vomiting noted at this time. Patient has dementia and has a very difficult time sometimes following directions. Her husband does the best he can taking care for home but he states is difficult sometimes to get her to do anything but just sit either in her wheelchair or the recliner which is probably why she's having such issues with her gluteal region. Nonetheless considering how much time she seems to spend sitting I'm pleased that the wounds are not any worse in appearance. 03/17/18 on evaluation today patient actually appears to be completely healed in regard to her ulcers on her gluteal region. She has been tolerating the dressing changes without complication. Her abdominal area also appears to be doing better there is no opening at the site either she continues to use the cream that was prescribed at the last visit. That is the next one to one of zinc oxide and nystatin cream. Overall this seems to be doing very well at this point in time. Electronic Signature(s) Signed: 03/17/2018 6:09:48 PM By: Worthy Keeler PA-C Entered By: Worthy Keeler on 03/17/2018 14:55:19 [name], [name] ([id number]) -------------------------------------------------------------------------------- Physical Exam Details Patient Name: [name], [name] Date of Service: 03/17/2018 2:00 PM Medical Record Number: [id number] Patient Account Number: [id number] Date of Birth/Sex: 1936-02-18 (82 y.o.  F) Treating RN: Montey Hora Primary Care Provider: Enid Derry Other Clinician: Referring Provider: Enid Derry Treating  Provider/Extender: STONE III, HOYT Weeks in Treatment: 1 Constitutional Obese and well-hydrated in no acute distress. Respiratory normal breathing without difficulty. Psychiatric this patient is able to make decisions and demonstrates good insight into disease process. Alert and Oriented x 3. pleasant and cooperative. Notes At this time everything appears to be completely healed and the patient seems to be doing very well at this time. She shows no evidence of infection and all wound areas are close. Unfortunately due to her dementia despite the fact that she seems to understand she's asleep in the bed her husband states that he's been hard-pressed to get her to do this each night unfortunately. He is trying the best he can. Electronic Signature(s) Signed: 03/17/2018 6:09:48 PM By: Worthy Keeler PA-C Entered By: Worthy Keeler on 03/17/2018 14:58:16 [name], [name] ([id number]) -------------------------------------------------------------------------------- Physician Orders Details Patient Name: Amy [name], [name] Date of Service: 03/17/2018 2:00 PM Medical Record Number: [id number] Patient Account Number: [id number] Date of Birth/Sex: [birth year] (82 y.o. F) Treating RN: Montey Hora Primary Care Provider: Enid Derry Other Clinician: Referring Provider: Enid Derry Treating Provider/Extender: Melburn Hake, HOYT Weeks in Treatment: 1 Verbal / Phone Orders: No Diagnosis Coding ICD-10 Coding Code Description L89.312 Pressure ulcer of right buttock, stage 2 L89.322 Pressure ulcer of left buttock, stage 2 L98.8 Other specified disorders of the skin and subcutaneous tissue E11.622 Type 2 diabetes mellitus with other skin ulcer N18.9 Chronic kidney disease, unspecified I10 Essential (primary) hypertension I73.9 Peripheral vascular disease,  unspecified Discharge From [facility] o Discharge from [place] - Please keep Desitin cream on bottom and encourage her to shift her weight while sitting and lay from side to side as often as possible. Please sleep in the bed rather than the recliner Electronic Signature(s) Signed: 03/17/2018 5:35:44 PM By: Montey Hora Signed: 03/17/2018 6:09:48 PM By: Worthy Keeler PA-C Entered By: Montey Hora on 03/17/2018 14:44:42 [name], [name] ([id number]) -------------------------------------------------------------------------------- Problem List Details Patient Name: [name], [name] Date of Service: 03/17/2018 2:00 PM Medical Record Number: [id number] Patient Account Number: [id number] Date of Birth/Sex: [birth year] (82 y.o. F) Treating RN: Montey Hora Primary Care Provider: Enid Derry Other Clinician: Referring Provider: Enid Derry Treating Provider/Extender: Melburn Hake, HOYT Weeks in Treatment: 1 Active Problems ICD-10 Evaluated Encounter Code Description Active Date Today Diagnosis L89.312 Pressure ulcer of right buttock, stage 2 03/10/2018 No Yes L89.322 Pressure ulcer of left buttock, stage 2 03/10/2018 No Yes L98.8 Other specified disorders of the skin and subcutaneous 03/10/2018 No Yes tissue E11.622 Type 2 diabetes mellitus with other skin ulcer 03/10/2018 No Yes N18.9 Chronic kidney disease, unspecified 03/10/2018 No Yes I10 Essential (primary) hypertension 03/10/2018 No Yes I73.9 Peripheral vascular disease, unspecified 03/10/2018 No Yes Inactive Problems Resolved Problems Electronic Signature(s) Signed: 03/17/2018 6:09:48 PM By: Worthy Keeler PA-C Entered By: Worthy Keeler on 03/17/2018 14:04:59 [name], [name] ([id number]) -------------------------------------------------------------------------------- Progress Note Details Patient Name: [name], [name] Date of Service: 03/17/2018 2:00 PM Medical Record Number:  [id number] Patient Account Number: [id number] Date of Birth/Sex: Sep 09, 1935 (82 y.o. F) Treating RN: Montey Hora Primary Care Provider: Enid Derry Other Clinician: Referring Provider: Enid Derry Treating Provider/Extender: Melburn Hake, HOYT Weeks in Treatment: 1 Subjective Chief Complaint Information obtained from Patient Bilateral gluteal ulcers and abdominal ulcer History of Present Illness (HPI) The following HPI elements were documented for the patient's wound: Associated Signs and Symptoms: The patient has a history of chronic kidney disease, diabetes mellitus type II although her hemoglobin A1c most recent was  5.8 and she is on the medications. 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 She is incontinent. 03/10/18 on evaluation today patient actually presents for evaluation concerning her bilateral gluteal ulcers which have been present for several weeks. She has been treated over time with different creams most recently polysporin. With that being said the patient actually seems to be doing somewhat better at this point in time during evaluation today. Nonetheless she still does have an opening mainly on the left side the right pretty much appears to be closed in my opinion. She does have pain on the left gluteal region. She also tells me about an area under the abdominal fold in the lower abdominal region which appears to be new just yesterday or today. This appears to potentially be a little bit of a fungal rash versus possibly friction from the way her husband has to help her with transfer. No fevers, chills, nausea, or vomiting noted at this time. Patient has dementia and has a very difficult time sometimes following directions. Her husband does the best he can taking care for home but he states is difficult sometimes to get her to do anything  but just sit either in her wheelchair or the recliner which is probably why she's having such issues with her gluteal region. Nonetheless considering how much time she seems to spend sitting I'm pleased that the wounds are not any worse in appearance. 03/17/18 on evaluation today patient actually appears to be completely healed in regard to her ulcers on her gluteal region. She has been tolerating the dressing changes without complication. Her abdominal area also appears to be doing better there is no opening at the site either she continues to use the cream that was prescribed at the last visit. That is the next one to one of zinc oxide and nystatin cream. Overall this seems to be doing very well at this point in time. Patient History Unable to Obtain Patient History due to Dementia. Information obtained from Patient. Family History Cancer - Siblings,Mother, Diabetes - Siblings, No family history of Heart Disease, Hypertension, Kidney Disease, Lung Disease, Seizures, Stroke, Thyroid Problems, Tuberculosis. Social History Former smoker, Marital Status - Married, Alcohol Use - Rarely, Drug Use - No History, Caffeine Use - Daily. Medical And Surgical History Notes Respiratory Nebulizer Genitourinary Aispuro, Aylani L. ([id number]) Left Kidney Removed Oncologic Left Kidney Review of Systems (ROS) Constitutional Symptoms (General Health) Denies complaints or symptoms of Fever, Chills. Respiratory The patient has no complaints or symptoms. Cardiovascular The patient has no complaints or symptoms. Psychiatric The patient has no complaints or symptoms. Objective Constitutional Obese and well-hydrated in no acute distress. Vitals Time Taken: 2:15 PM, Height: 62 in, Weight: 184 lbs, BMI: 33.7, Temperature: 97.8 F, Pulse: 64 bpm, Respiratory Rate: 16 breaths/min, Blood Pressure: 111/63 mmHg. Respiratory normal breathing without difficulty. Psychiatric this patient is able to make  decisions and demonstrates good insight into disease process. Alert and Oriented x 3. pleasant and cooperative. General Notes: At this time everything appears to be completely healed and the patient seems to be doing very well at this time. She shows no evidence of infection and all wound areas are close. Unfortunately due to her dementia despite the fact that she seems to understand she's asleep in the bed her husband states that he's been hard-pressed to get her to do this each night unfortunately.  He is trying the best he can. Integumentary (Hair, Skin) Wound #2 status is Open. Original cause of wound was Gradually Appeared. The wound is located on the Left Gluteus. The wound measures 0cm length x 0cm width x 0cm depth; 0cm^2 area and 0cm^3 volume. Wound #3 status is Open. Original cause of wound was Shear/Friction. The wound is located on the Medial Abdomen - Upper Quadrant. The wound measures 0cm length x 0cm width x 0cm depth; 0cm^2 area and 0cm^3 volume. Assessment Active Problems [name], [name] ([id number]) ICD-10 Pressure ulcer of right buttock, stage 2 Pressure ulcer of left buttock, stage 2 Other specified disorders of the skin and subcutaneous tissue Type 2 diabetes mellitus with other skin ulcer Chronic kidney disease, unspecified Essential (primary) hypertension Peripheral vascular disease, unspecified Plan Discharge From [facility]: Discharge from [place] - Please keep Desitin cream on bottom and encourage her to shift her weight while sitting and lay from side to side as often as possible. Please sleep in the bed rather than the recliner Patient's wound bed currently shows evidence of good epithelialization. Overall I think everything is healed in regard to the gluteal region I would recommend Desitin cream to this area. She will continue with the mix of Desitin and nystatin to the abdominal region which I think is definitely appropriate. If anything  changes or worsens meantime she will let us know otherwise will see her back for reevaluation in the future as needed. Electronic Signature(s) Signed: 03/17/2018 6:09:48 PM By: Worthy Keeler PA-C Entered By: Worthy Keeler on 03/17/2018 14:59:06 [name], [name] ([id number]) -------------------------------------------------------------------------------- ROS/PFSH Details Patient Name: ALZINA, GOLDA L. Date of Service: 03/17/2018 2:00 PM Medical Record Number: [id number] Patient Account Number: [id number] Date of Birth/Sex: February 18, 1936 (82 y.o. F) Treating RN: Montey Hora Primary Care Provider: Enid Derry Other Clinician: Referring Provider: Enid Derry Treating Provider/Extender: Melburn Hake, HOYT Weeks in Treatment: 1 Unable to Obtain Patient History due to oo Dementia Information Obtained From Patient Wound History Do you currently have one or more open woundso Yes How many open wounds do you currently haveo 2 Approximately how long have you had your woundso 3 weeks How have you been treating your wound(s) until nowo aloe [facility] and polysporin Has your wound(s) ever healed and then re-openedo No Have you had any lab work done in the past montho No Have you tested positive for an antibiotic resistant organism (MRSA, VRE)o No Have you tested positive for osteomyelitis (bone infection)o No Have you had any tests for circulation on your legso Yes Where was the test doneo [facility] Constitutional Symptoms (General Health) Complaints and Symptoms: Negative for: Fever; Chills Eyes Medical History: Positive for: Cataracts Negative for: Glaucoma; Optic Neuritis Ear/Nose/Mouth/Throat Medical History: Negative for: Chronic sinus problems/congestion; Middle ear problems Hematologic/Lymphatic Medical History: Positive for: Anemia Negative for: Hemophilia; Human Immunodeficiency Virus; Lymphedema Respiratory Complaints and Symptoms: No Complaints or Symptoms Medical  History: Negative for: Aspiration; Asthma; Chronic Obstructive Pulmonary Disease (COPD); Pneumothorax; Sleep Apnea; Tuberculosis Past Medical History Notes: Nebulizer [name], [name] ([id number]) Cardiovascular Complaints and Symptoms: No Complaints or Symptoms Medical History: Positive for: Hypertension; Peripheral Arterial Disease Negative for: Angina; Arrhythmia; Congestive Heart Failure; Coronary Artery Disease; Deep Vein Thrombosis; Hypotension; Myocardial Infarction; Peripheral Venous Disease; Phlebitis; Vasculitis Gastrointestinal Medical History: Negative for: Cirrhosis ; Colitis; Crohnos; Hepatitis A; Hepatitis B; Hepatitis C Endocrine Medical History: Positive for: Type II Diabetes Negative for: Type I Diabetes Time with diabetes: 7 years Treated with: Diet Blood sugar tested every  day: No Genitourinary Medical History: Negative for: End Stage Renal Disease Past Medical History Notes: Left Kidney Removed Immunological Medical History: Negative for: Lupus Erythematosus; Raynaudos; Scleroderma Integumentary (Skin) Medical History: Negative for: History of Burn; History of pressure wounds Musculoskeletal Medical History: Positive for: Osteoarthritis - hands Negative for: Gout; Rheumatoid Arthritis; Osteomyelitis Neurologic Medical History: Positive for: Dementia Negative for: Neuropathy; Quadriplegia; Paraplegia; Seizure Disorder Oncologic Medical History: Negative for: Received Chemotherapy; Received Radiation Past Medical History Notes: Herdt, Riva L. ([id number]) Left Kidney Psychiatric Complaints and Symptoms: No Complaints or Symptoms Medical History: Negative for: Anorexia/bulimia; Confinement Anxiety HBO Extended History Items Eyes: Cataracts Immunizations Pneumococcal Vaccine: Received Pneumococcal Vaccination: Yes Implantable Devices Family and Social History Cancer: Yes - Siblings,Mother; Diabetes: Yes - Siblings; Heart Disease:  No; Hypertension: No; Kidney Disease: No; Lung Disease: No; Seizures: No; Stroke: No; Thyroid Problems: No; Tuberculosis: No; Former smoker; Marital Status - Married; Alcohol Use: Rarely; Drug Use: No History; Caffeine Use: Daily; Advanced Directives: No; Patient does not want information on Advanced Directives; Living Will: No; Medical Power of Attorney: No Physician Affirmation I have reviewed and agree with the above information. Electronic Signature(s) Signed: 03/17/2018 5:35:44 PM By: Montey Hora Signed: 03/17/2018 6:09:48 PM By: Worthy Keeler PA-C Entered By: Worthy Keeler on 03/17/2018 14:57:16 Morden, Theora L. ([id number]) -------------------------------------------------------------------------------- SuperBill Details Patient Name: TARRIN, MENN L. Date of Service: 03/17/2018 Medical Record Number: [id number] Patient Account Number: [id number] Date of Birth/Sex: Dec 07, 1935 (82 y.o. F) Treating RN: Montey Hora Primary Care Provider: Enid Derry Other Clinician: Referring Provider: Enid Derry Treating Provider/Extender: Melburn Hake, HOYT Weeks in Treatment: 1 Diagnosis Coding ICD-10 Codes Code Description [phone] Pressure ulcer of right buttock, stage 2 L89.322 Pressure ulcer of left buttock, stage 2 L98.8 Other specified disorders of the skin and subcutaneous tissue E11.622 Type 2 diabetes mellitus with other skin ulcer N18.9 Chronic kidney disease, unspecified I10 Essential (primary) hypertension I73.9 Peripheral vascular disease, unspecified Facility Procedures CPT4 Code: [id number] Description: 99213 - WOUND CARE VISIT-LEV 3 EST PT Modifier: Quantity: 1 Physician Procedures CPT4 Code: 6301601 Description: 09323 - WC PHYS LEVEL 2 - EST PT ICD-10 Diagnosis Description L89.312 Pressure ulcer of right buttock, stage 2 L89.322 Pressure ulcer of left buttock, stage 2 L98.8 Other specified disorders of the skin and subcutaneous t E11.622 Type 2  diabetes  mellitus with other skin ulcer Modifier: issue Quantity: 1 Electronic Signature(s) Signed: 03/17/2018 3:01:09 PM By: Montey Hora Signed: 03/17/2018 6:09:48 PM By: Worthy Keeler PA-C Entered By: Montey Hora on 03/17/2018 15:01:09

## 2018-03-21 DIAGNOSIS — J449 Chronic obstructive pulmonary disease, unspecified: Secondary | ICD-10-CM | POA: Diagnosis not present

## 2018-03-21 DIAGNOSIS — Z8639 Personal history of other endocrine, nutritional and metabolic disease: Secondary | ICD-10-CM | POA: Diagnosis not present

## 2018-03-21 DIAGNOSIS — F411 Generalized anxiety disorder: Secondary | ICD-10-CM | POA: Diagnosis not present

## 2018-03-21 DIAGNOSIS — E782 Mixed hyperlipidemia: Secondary | ICD-10-CM | POA: Diagnosis not present

## 2018-03-21 DIAGNOSIS — R0602 Shortness of breath: Secondary | ICD-10-CM | POA: Diagnosis not present

## 2018-03-21 DIAGNOSIS — G301 Alzheimer's disease with late onset: Secondary | ICD-10-CM | POA: Diagnosis not present

## 2018-03-21 DIAGNOSIS — G546 Phantom limb syndrome with pain: Secondary | ICD-10-CM | POA: Diagnosis not present

## 2018-03-21 DIAGNOSIS — R011 Cardiac murmur, unspecified: Secondary | ICD-10-CM | POA: Diagnosis not present

## 2018-03-21 DIAGNOSIS — I208 Other forms of angina pectoris: Secondary | ICD-10-CM | POA: Diagnosis not present

## 2018-03-21 NOTE — Progress Notes (Signed)
Douthit, Jalaina L. ([id number]) Visit Report for 03/17/2018 Arrival Information Details Patient Name: Amy [name], Amy [name]. Date of Service: 03/17/2018 2:00 PM Medical Record Number: [id number] Patient Account Number: [id number] Date of Birth/Sex: January 12, 1936 (82 y.o. F) Treating RN: Cornell Barman Primary Care Dannelle Rhymes: Enid Derry Other Clinician: Referring Mikela Senn: Enid Derry Treating Darlena Koval/Extender: Melburn Hake, HOYT Weeks in Treatment: 1 Visit Information History Since Last Visit Added or deleted any medications: No Patient Arrived: Wheel Chair Any new allergies or adverse reactions: No Arrival Time: 14:11 Had a fall or experienced change in No Accompanied By: husband activities of daily living that may affect Transfer Assistance: Civil Service fast streamer risk of falls: Patient Identification Verified: Yes Signs or symptoms of abuse/neglect since last visito No Secondary Verification Process Yes Hospitalized since last visit: No Completed: Implantable device outside of the clinic excluding No Patient Requires Transmission-Based No cellular tissue based products placed in the center Precautions: since last visit: Patient Has Alerts: Yes Has Dressing in Place as Prescribed: Yes Patient Alerts: Patient on Blood Pain Present Now: No Thinner Type II Diabetic ROOM 4/5 (Lift) Electronic Signature(s) Signed: 03/20/2018 3:24:13 PM By: Gretta Cool, BSN, RN, CWS, Kim RN, BSN Entered By: Gretta Cool, BSN, RN, CWS, Kim on 03/17/2018 14:14:11 Mansfield, Kiylee L. ([id number]) -------------------------------------------------------------------------------- Clinic Level of Care Assessment Details Patient Name: Amy, [name] Date of Service: 03/17/2018 2:00 PM Medical Record Number: [id number] Patient Account Number: [id number] Date of Birth/Sex: [birth year] (82 y.o. F) Treating RN: Montey Hora Primary Care Jaret Coppedge: Enid Derry Other Clinician: Referring Brynnlie Unterreiner: Enid Derry Treating  Tiffine Henigan/Extender: Melburn Hake, HOYT Weeks in Treatment: 1 Clinic Level of Care Assessment Items TOOL 4 Quantity Score []  - Use when only an EandM is performed on FOLLOW-UP visit 0 ASSESSMENTS - Nursing Assessment / Reassessment X - Reassessment of Co-morbidities (includes updates in patient status) 1 10 X- 1 5 Reassessment of Adherence to Treatment Plan ASSESSMENTS - Wound and Skin Assessment / Reassessment []  - Simple Wound Assessment / Reassessment - one wound 0 X- 2 5 Complex Wound Assessment / Reassessment - multiple wounds X- 1 10 Dermatologic / Skin Assessment (not related to wound area) ASSESSMENTS - Focused Assessment []  - Circumferential Edema Measurements - multi extremities 0 []  - 0 Nutritional Assessment / Counseling / Intervention []  - 0 Lower Extremity Assessment (monofilament, tuning fork, pulses) []  - 0 Peripheral Arterial Disease Assessment (using hand held doppler) ASSESSMENTS - Ostomy and/or Continence Assessment and Care []  - Incontinence Assessment and Management 0 []  - 0 Ostomy Care Assessment and Management (repouching, etc.) PROCESS - Coordination of Care X - Simple Patient / Family Education for ongoing care 1 15 []  - 0 Complex (extensive) Patient / Family Education for ongoing care []  - 0 Staff obtains Programmer, systems, Records, Test Results / Process Orders []  - 0 Staff telephones HHA, Nursing Homes / Clarify orders / etc []  - 0 Routine Transfer to another Facility (non-emergent condition) []  - 0 Routine Hospital Admission (non-emergent condition) []  - 0 New Admissions / Biomedical engineer / Ordering NPWT, Apligraf, etc. []  - 0 Emergency Hospital Admission (emergent condition) X- 1 10 Simple Discharge Coordination [name], [name] ([id number]) []  - 0 Complex (extensive) Discharge Coordination PROCESS - Special Needs []  - Pediatric / Minor Patient Management 0 []  - 0 Isolation Patient Management []  - 0 Hearing / Language / Visual special  needs []  - 0 Assessment of Community assistance (transportation, D/C planning, etc.) []  - 0 Additional assistance / Altered mentation []  - 0 Support Surface(s) Assessment (bed, cushion, seat, etc.)  INTERVENTIONS - Wound Cleansing / Measurement []  - Simple Wound Cleansing - one wound 0 X- 2 5 Complex Wound Cleansing - multiple wounds X- 1 5 Wound Imaging (photographs - any number of wounds) []  - 0 Wound Tracing (instead of photographs) []  - 0 Simple Wound Measurement - one wound X- 2 5 Complex Wound Measurement - multiple wounds INTERVENTIONS - Wound Dressings []  - Small Wound Dressing one or multiple wounds 0 []  - 0 Medium Wound Dressing one or multiple wounds []  - 0 Large Wound Dressing one or multiple wounds X- 1 5 Application of Medications - topical []  - 0 Application of Medications - injection INTERVENTIONS - Miscellaneous []  - External ear exam 0 []  - 0 Specimen Collection (cultures, biopsies, blood, body fluids, etc.) []  - 0 Specimen(s) / Culture(s) sent or taken to Lab for analysis X- 1 10 Patient Transfer (multiple staff / Civil Service fast streamer / Similar devices) []  - 0 Simple Staple / Suture removal (25 or less) []  - 0 Complex Staple / Suture removal (26 or more) []  - 0 Hypo / Hyperglycemic Management (close monitor of Blood Glucose) []  - 0 Ankle / Brachial Index (ABI) - do not check if billed separately X- 1 5 Vital Signs [name], [name] ([id number]) Has the patient been seen at the hospital within the last three years: Yes Total Score: 105 Level Of Care: New/Established - Level 3 Electronic Signature(s) Signed: 03/17/2018 5:35:44 PM By: Montey Hora Entered By: Montey Hora on 03/17/2018 15:01:01 Denunzio, Octivia L. ([id number]) -------------------------------------------------------------------------------- Encounter Discharge Information Details Patient Name: [name], [name] Date of Service: 03/17/2018 2:00 PM Medical Record Number:  [id number] Patient Account Number: [id number] Date of Birth/Sex: Jul 04, 1935 (82 y.o. F) Treating RN: Montey Hora Primary Care Kriti Katayama: Enid Derry Other Clinician: Referring Cyprian Gongaware: Enid Derry Treating [place]/Extender: Melburn Hake, HOYT Weeks in Treatment: 1 Encounter Discharge Information Items Discharge Condition: Stable Ambulatory Status: Wheelchair Discharge Destination: Home Transportation: Private Auto Accompanied By: spouse Schedule Follow-up Appointment: No Clinical Summary of Care: Electronic Signature(s) Signed: 03/17/2018 3:01:28 PM By: Montey Hora Entered By: Montey Hora on 03/17/2018 15:01:28 Brink, Onya L. ([id number]) -------------------------------------------------------------------------------- Lower Extremity Assessment Details Patient Name: SKORUPSKI, Ziyonna L. Date of Service: 03/17/2018 2:00 PM Medical Record Number: [id number] Patient Account Number: [id number] Date of Birth/Sex: [birth year] (82 y.o. F) Treating RN: Cornell Barman Primary Care Krishav Mamone: Enid Derry Other Clinician: Referring Daryl Beehler: Enid Derry Treating Terrionna Bridwell/Extender: Worthy Keeler Weeks in Treatment: 1 Electronic Signature(s) Signed: 03/20/2018 3:24:13 PM By: Gretta Cool, BSN, RN, CWS, Kim RN, BSN Entered By: Gretta Cool, BSN, RN, CWS, Kim on 03/17/2018 14:26:32 Domingo, Tiffancy L. ([id number]) -------------------------------------------------------------------------------- Stewart Details Patient Name: MONTGOMERY, ROTHLISBERGER. Date of Service: 03/17/2018 2:00 PM Medical Record Number: [id number] Patient Account Number: [id number] Date of Birth/Sex: 01-Jan-1936 (82 y.o. F) Treating RN: Montey Hora Primary Care Corsica Franson: Enid Derry Other Clinician: Referring Lynkin Saini: Enid Derry Treating Josuel Koeppen/Extender: Melburn Hake, HOYT Weeks in Treatment: 1 Active Inactive Electronic Signature(s) Signed: 03/17/2018 5:35:44 PM By: Montey Hora Entered  By: Montey Hora on 03/17/2018 14:43:35 Reeder, Junnie L. ([id number]) -------------------------------------------------------------------------------- Pain Assessment Details Patient Name: CHARLA, CRISCIONE L. Date of Service: 03/17/2018 2:00 PM Medical Record Number: [id number] Patient Account Number: [id number] Date of Birth/Sex: 1935/12/27 (82 y.o. F) Treating RN: Cornell Barman Primary Care Arlanda Shiplett: Enid Derry Other Clinician: Referring Jeryl Umholtz: Enid Derry Treating Elmus Mathes/Extender: Melburn Hake, HOYT Weeks in Treatment: 1 Active Problems Location of Pain Severity and Description of Pain Patient Has Paino No Site Locations Pain Management and Medication Current Pain Management:  Electronic Signature(s) Signed: 03/20/2018 3:24:13 PM By: Gretta Cool, BSN, RN, CWS, Kim RN, BSN Entered By: Gretta Cool, BSN, RN, CWS, Kim on 03/17/2018 14:14:23 Fambro, Jamyra L. ([id number]) -------------------------------------------------------------------------------- Patient/Caregiver Education Details Patient Name: LEZLEY, BEDGOOD L. Date of Service: 03/17/2018 2:00 PM Medical Record Number: [id number] Patient Account Number: [id number] Date of Birth/Gender: April 25, 1936 (82 y.o. F) Treating RN: Montey Hora Primary Care Physician: Enid Derry Other Clinician: Referring Physician: Enid Derry Treating Physician/Extender: Sharalyn Ink in Treatment: 1 Education Assessment Education Provided To: Patient and Caregiver Education Topics Provided Offloading: Handouts: Other: pressure relief measures Methods: Explain/Verbal Responses: State content correctly Electronic Signature(s) Signed: 03/17/2018 5:35:44 PM By: Montey Hora Entered By: Montey Hora on 03/17/2018 15:01:53 Blyth, Malkia L. ([id number]) -------------------------------------------------------------------------------- Wound Assessment Details Patient Name: RUPERT, Iylah L. Date of Service:  03/17/2018 2:00 PM Medical Record Number: [id number] Patient Account Number: [id number] Date of Birth/Sex: 1936/01/21 (82 y.o. F) Treating RN: Cornell Barman Primary Care Lenda Baratta: Enid Derry Other Clinician: Referring Mercedies Ganesh: Enid Derry Treating Krisna Omar/Extender: Melburn Hake, HOYT Weeks in Treatment: 1 Wound Status Wound Number: 2 Primary Etiology: Pressure Ulcer Wound Location: Left Gluteus Wound Status: Open Wounding Event: Gradually Appeared Date Acquired: 02/17/2018 Weeks Of Treatment: 1 Clustered Wound: No Wound Measurements Length: (cm) 0 Width: (cm) 0 Depth: (cm) 0 Area: (cm) 0 Volume: (cm) 0 % Reduction in Area: 100% % Reduction in Volume: 100% Wound Description Classification: Category/Stage II Periwound Skin Texture Texture Color No Abnormalities Noted: No No Abnormalities Noted: No Moisture No Abnormalities Noted: No Electronic Signature(s) Signed: 03/20/2018 3:24:13 PM By: Gretta Cool, BSN, RN, CWS, Kim RN, BSN Entered By: Gretta Cool, BSN, RN, CWS, Kim on 03/17/2018 14:26:23 Nomura, Sylva L. ([id number]) -------------------------------------------------------------------------------- Wound Assessment Details Patient Name: FILOSA, Meral L. Date of Service: 03/17/2018 2:00 PM Medical Record Number: [id number] Patient Account Number: [id number] Date of Birth/Sex: Nov 16, 1935 (82 y.o. F) Treating RN: Cornell Barman Primary Care Jissel Slavens: Enid Derry Other Clinician: Referring Jayd Forrey: Enid Derry Treating Duane Trias/Extender: Melburn Hake, HOYT Weeks in Treatment: 1 Wound Status Wound Number: 3 Primary MASD o Moisture Associated Skin Etiology: Damage Wound Location: Medial Abdomen - Upper Quadrant Wound Status: Open Wounding Event: Shear/Friction Date Acquired: 03/09/2018 Weeks Of Treatment: 1 Clustered Wound: Yes Wound Measurements Length: (cm) 0 % Redu Width: (cm) 0 % Redu Depth: (cm) 0 Area: (cm) 0 Volume: (cm) 0 ction in Area: 100% ction in  Volume: 100% Periwound Skin Texture Texture Color No Abnormalities Noted: No No Abnormalities Noted: No Moisture No Abnormalities Noted: No Electronic Signature(s) Signed: 03/20/2018 3:24:13 PM By: Gretta Cool, BSN, RN, CWS, Kim RN, BSN Entered By: Gretta Cool, BSN, RN, CWS, Kim on 03/17/2018 14:26:24 Bosque, Ltanya L. ([id number]) -------------------------------------------------------------------------------- Vitals Details Patient Name: EMREE, LOCICERO L. Date of Service: 03/17/2018 2:00 PM Medical Record Number: [id number] Patient Account Number: [id number] Date of Birth/Sex: [birth year] (82 y.o. F) Treating RN: Cornell Barman Primary Care Felisha Claytor: Enid Derry Other Clinician: Referring Kesley Mullens: Enid Derry Treating Xzaiver Vayda/Extender: Melburn Hake, HOYT Weeks in Treatment: 1 Vital Signs Time Taken: 14:15 Temperature (F): 97.8 Height (in): 62 Pulse (bpm): 64 Weight (lbs): 184 Respiratory Rate (breaths/min): 16 Body Mass Index (BMI): 33.7 Blood Pressure (mmHg): 111/63 Reference Range: 80 - 120 mg / dl Electronic Signature(s) Signed: 03/20/2018 3:24:13 PM By: Gretta Cool, BSN, RN, CWS, Kim RN, BSN Entered By: Gretta Cool, BSN, RN, CWS, Kim on 03/17/2018 14:15:10

## 2018-03-28 ENCOUNTER — Other Ambulatory Visit: Payer: Self-pay

## 2018-03-28 ENCOUNTER — Encounter: Payer: Self-pay | Admitting: Student in an Organized Health Care Education/Training Program

## 2018-03-28 ENCOUNTER — Other Ambulatory Visit
Admission: RE | Admit: 2018-03-28 | Discharge: 2018-03-28 | Disposition: A | Discharge status: Home / Self Care | Payer: Medicare HMO | Source: Ambulatory Visit | Attending: Student in an Organized Health Care Education/Training Program | Admitting: Student in an Organized Health Care Education/Training Program

## 2018-03-28 ENCOUNTER — Ambulatory Visit: Payer: Medicare HMO | Admitting: Student in an Organized Health Care Education/Training Program

## 2018-03-28 VITALS — BP 116/74 | HR 64 | Temp 98.0°F | Resp 18 | Ht 63.0 in | Wt 184.0 lb

## 2018-03-28 DIAGNOSIS — G546 Phantom limb syndrome with pain: Secondary | ICD-10-CM

## 2018-03-28 DIAGNOSIS — G894 Chronic pain syndrome: Secondary | ICD-10-CM

## 2018-03-28 DIAGNOSIS — F1120 Opioid dependence, uncomplicated: Secondary | ICD-10-CM

## 2018-03-28 DIAGNOSIS — N184 Chronic kidney disease, stage 4 (severe): Secondary | ICD-10-CM

## 2018-03-28 MED ORDER — HYDROCODONE-ACETAMINOPHEN 7.5-325 MG PO TABS: 1.0000 | ORAL_TABLET | Freq: Three times a day (TID) | ORAL | 0 refills | Status: DC | PRN

## 2018-03-28 MED ORDER — GABAPENTIN 300 MG PO CAPS: 300.0000 mg | ORAL_CAPSULE | Freq: Three times a day (TID) | ORAL | 3 refills | Status: DC

## 2018-03-28 MED ORDER — HYDROCODONE-ACETAMINOPHEN 7.5-325 MG PO TABS: 1.0000 | ORAL_TABLET | Freq: Three times a day (TID) | ORAL | 0 refills | Status: AC | PRN

## 2018-03-28 NOTE — Progress Notes (Signed)
Patient's Name: Amy [name]  MRN: [id number]  Referring Provider: Arnetha Courser, MD  DOB: 1935/07/01  PCP: Arnetha Courser, MD  DOS: 03/28/2018  Note by: Gillis Santa, MD  Service setting: Ambulatory outpatient  Specialty: Interventional Pain Management  Location: [facility] (AMB) Pain Management Facility    Patient type: Established   Primary Reason(s) for Visit: Encounter for prescription drug management. (Level of risk: moderate)  CC: Knee Pain (right); Abdominal Pain; and Leg Pain (phantom pain)  HPI  Ms. Bonano is a 82 y.o. year old, female patient, who comes today for a medication management evaluation. She has Phantom limb syndrome with pain ([place]); Anxiety; Elevated serum creatinine; Diabetes mellitus type 2, controlled ([place]); GERD (gastroesophageal reflux disease); GAD (generalized anxiety disorder); AB (asthmatic bronchitis); Aortic atherosclerosis ([place]); Cataract; Leg pain; Continuous opioid dependence (HCC); CKD (chronic kidney disease) stage 4, GFR 15-29 ml/min (HCC); COPD, mild ([place]); Excessive falling; Fatty infiltration of liver; H/O adenomatous polyp of colon; Hyperlipidemia; Anterior knee pain; LBP (low back pain); Malaise and fatigue; Abnormal presence of protein in urine; PVD (peripheral vascular disease) ([place]); Allergic rhinitis; At risk for falling; Renal mass; Abnormal EKG; Renal neoplasm; S/p nephrectomy; 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 ([place]); Wheelchair dependent; S/P AKA (above knee amputation) unilateral, left ([place]); Anemia; Iron deficiency anemia; Depression; Chronic pain syndrome; Late onset Alzheimer's disease with behavioral disturbance ([place]); Choledocholithiasis; Osteopenia; QT prolongation; Hypertensive renal disease; and Secondary hyperparathyroidism of renal origin [facility]) on their problem list. Her primarily concern today is the Knee Pain (right); Abdominal Pain; and Leg Pain (phantom pain)  Pain Assessment: Location: Right Knee Radiating:  denies Onset: More than a month ago Duration: Chronic pain Quality: Aching Severity: 0-No pain/10 (subjective, self-reported pain score)  Note: Reported level is compatible with observation.                         When using our objective Pain Scale, levels between 6 and 10/10 are said to belong in an emergency room, as it progressively worsens from a 6/10, described as severely limiting, requiring emergency care not usually available at an outpatient pain management facility. At a 6/10 level, communication becomes difficult and requires great effort. Assistance to reach the emergency department may be required. Facial flushing and profuse sweating along with potentially dangerous increases in heart rate and blood pressure will be evident. Effect on ADL:   Timing: Intermittent Modifying factors: meds BP: 116/74  HR: 64  Ms. Benegas was last scheduled for an appointment on 12/27/2017 for medication management. During today's appointment we reviewed Ms. Buckingham chronic pain status, as well as her outpatient medication regimen.  The patient  reports that she does not use drugs. Her body mass index is 32.59 kg/m.  Further details on both, my assessment(s), as well as the proposed treatment plan, please see below.  Controlled Substance Pharmacotherapy Assessment REMS (Risk Evaluation and Mitigation Strategy)  Analgesic: Hydrocodone 7.5 mg 3 times daily as needed MME/day: 22.5 mg/day.  Rise Patience, RN  03/28/2018 12:03 PM  Addendum Nursing Pain Medication Assessment:  Safety precautions to be maintained throughout the outpatient stay will include: orient to surroundings, keep bed in low position, maintain call bell within reach at all times, provide assistance with transfer out of bed and ambulation.  Medication Inspection Compliance: Pill count conducted under aseptic conditions, in front of the patient. Neither the pills nor the bottle was removed from the patient's sight at any time. Once  count  was completed pills were immediately returned to the patient in their original bottle.  Medication: Hydrocodone/APAP Pill/Patch Count: 40 of 90 pills remain Pill/Patch Appearance: Markings consistent with prescribed medication Bottle Appearance: Standard pharmacy container. Clearly labeled. Filled Date: 10 / 23 / 2019 Last Medication intake:  Today   Pt states she has 16 pills at home in pill counter.   Pharmacokinetics: Liberation and absorption (onset of action): WNL Distribution (time to peak effect): WNL Metabolism and excretion (duration of action): WNL         Pharmacodynamics: Desired effects: Analgesia: Ms. Hoge reports >50% benefit. Functional ability: Patient reports that medication allows her to accomplish basic ADLs Clinically meaningful improvement in function (CMIF): Sustained CMIF goals met Perceived effectiveness: Described as relatively effective, allowing for increase in activities of daily living (ADL) Undesirable effects: Side-effects or Adverse reactions: None reported Monitoring: [place] PMP: Online review of the past 76-monthperiod conducted. Compliant with practice rules and regulations Last UDS on record: No results found for: SUMMARY UDS interpretation: Compliant          Medication Assessment Form: Reviewed. Patient indicates being compliant with therapy Treatment compliance: Compliant Risk Assessment Profile: Aberrant behavior: See prior evaluations. None observed or detected today Comorbid factors increasing risk of overdose: See prior notes. No additional risks detected today Opioid risk tool (ORT) (Total Score): 1 Personal History of Substance Abuse (SUD-Substance use disorder):  Alcohol: Negative  Illegal Drugs: Negative  Rx Drugs: Negative  ORT Risk Level calculation: Low Risk Risk of substance use disorder (SUD): Low Opioid Risk Tool - 03/28/18 1159      Family History of Substance Abuse   Alcohol  Negative    Illegal Drugs  Negative     Rx Drugs  Negative      Personal History of Substance Abuse   Alcohol  Negative    Illegal Drugs  Negative    Rx Drugs  Negative      Age   Age between 184-45years   No      History of Preadolescent Sexual Abuse   History of Preadolescent Sexual Abuse  Negative or Female      Psychological Disease   Psychological Disease  Negative    Depression  Positive      Total Score   Opioid Risk Tool Scoring  1    Opioid Risk Interpretation  Low Risk      ORT Scoring interpretation table:  Score <3 = Low Risk for SUD  Score between 4-7 = Moderate Risk for SUD  Score >8 = High Risk for Opioid Abuse   Risk Mitigation Strategies:  Patient Counseling: Covered Patient-Prescriber Agreement (PPA): Present and active  Notification to other healthcare providers: Done  Pharmacologic Plan: No change in therapy, at this time.             Laboratory Chemistry  Inflammation Markers (CRP: Acute Phase) (ESR: Chronic Phase) Lab Results  Component Value Date   ESRSEDRATE 19 01/27/2017                         Rheumatology Markers Lab Results  Component Value Date   RF <14 01/27/2017   ANA NEGATIVE 01/27/2017                        Renal Function Markers Lab Results  Component Value Date   BUN 19 03/01/2018   CREATININE 1.69 (H) 03/01/2018   BCR 9 02/03/2018  GFRAA 32 (L) 03/01/2018   GFRNONAA 27 (L) 03/01/2018                             Hepatic Function Markers Lab Results  Component Value Date   AST 26 03/01/2018   ALT 14 03/01/2018   ALBUMIN 2.5 (L) 03/01/2018   ALKPHOS 102 03/01/2018   LIPASE 50 08/12/2017                        Electrolytes Lab Results  Component Value Date   NA 141 03/01/2018   K 3.7 03/01/2018   CL 105 03/01/2018   CALCIUM 7.9 (L) 03/01/2018   MG 1.2 (L) 10/05/2012                        Neuropathy Markers Lab Results  Component Value Date   VITAMINB12 456 03/31/2016   FOLATE 9.8 07/14/2016   HGBA1C 5.8 (H) 02/03/2018                         CNS Tests No results found for: COLORCSF, APPEARCSF, RBCCOUNTCSF, WBCCSF, POLYSCSF, LYMPHSCSF, EOSCSF, PROTEINCSF, GLUCCSF, JCVIRUS, CSFOLI, IGGCSF                      Bone Pathology Markers Lab Results  Component Value Date   VD25OH 17 (L) 02/03/2018                         Coagulation Parameters Lab Results  Component Value Date   PLT 190 03/01/2018                        Cardiovascular Markers Lab Results  Component Value Date   BNP 195 (H) 08/15/2014   TROPONINI <0.03 09/01/2017   HGB 11.8 (L) 03/01/2018   HCT 36.4 03/01/2018                         CA Markers No results found for: CEA, CA125, LABCA2                      Note: Lab results reviewed.  Recent Diagnostic Imaging Results  CT CHEST WO CONTRAST CLINICAL DATA:  Stage III renal cell carcinoma status post left nephrectomy. Restaging.  EXAM: CT CHEST, ABDOMEN AND PELVIS WITHOUT CONTRAST  TECHNIQUE: Multidetector CT imaging of the chest, abdomen and pelvis was performed following the standard protocol without IV contrast.  COMPARISON:  08/12/2017 CT chest, abdomen and pelvis.  FINDINGS: CT CHEST FINDINGS  Cardiovascular: Normal heart size. Stable trace pericardial effusion/thickening. Left anterior descending and right coronary atherosclerosis. Atherosclerotic nonaneurysmal thoracic aorta. Normal caliber pulmonary arteries.  Mediastinum/Nodes: No discrete thyroid nodules. Unremarkable esophagus. No pathologically enlarged axillary, mediastinal or hilar lymph nodes, noting limited sensitivity for the detection of hilar adenopathy on this noncontrast study.  Lungs/Pleura: No pneumothorax. No pleural effusion. Mild centrilobular and paraseptal emphysema with diffuse bronchial wall thickening. Solid medial left lower lobe 8 mm pulmonary nodule (series 4/image 111), increased from 5 mm. Posterior left upper lobe 4 mm solid pulmonary nodule (series 4/image 56), stable. Subpleural 10 x 4 mm  (average diameter 7 mm) anterior basilar right upper lobe solid pulmonary nodule (series 4/image 105), previously 10 x 5 mm using similar measurement technique, stable.  Ground-glass 0.9 cm medial left upper lobe pulmonary nodule (series 4/image 52), previously 0.6 cm using similar measurement technique, increased. Parenchymal bands in the dependent right middle and right upper lobe and left lung base are stable, compatible with postinfectious/postinflammatory scarring. No acute consolidative airspace disease, lung masses or new significant pulmonary nodules. Stable tiny calcified apical right upper lobe granuloma.  Musculoskeletal: No aggressive appearing focal osseous lesions. Stable chronic severe T12 vertebral compression fracture.  CT ABDOMEN PELVIS FINDINGS  Hepatobiliary: Diffuse hepatic steatosis. No liver mass. No definite liver surface irregularity. Cholecystectomy no biliary ductal dilatation.  Pancreas: Normal, with no mass or duct dilation.  Spleen: Normal size. No mass.  Adrenals/Urinary Tract: No adrenal nodules. Left nephrectomy. No mass or fluid collection in the left nephrectomy bed. No right renal stones. No right hydronephrosis. Simple right renal cysts, largest an exophytic 9.0 cm lateral interpolar right renal cyst. Slightly hyperdense 1.2 cm renal cortical lesion in the posterior right kidney (series 3/image 82), stable. No new contour deforming right renal lesions. Normal bladder.  Stomach/Bowel: Normal non-distended stomach. Normal caliber small bowel with no small bowel wall thickening. Appendix not discretely visualized. Normal large bowel with no diverticulosis, large bowel wall thickening or pericolonic fat stranding.  Vascular/Lymphatic: Atherosclerotic abdominal aorta with stable 2.8 cm ectatic infrarenal abdominal aorta. No pathologically enlarged lymph nodes in the abdomen or pelvis.  Reproductive: Status post hysterectomy, with no abnormal  findings at the vaginal cuff. No adnexal mass.  Other: No pneumoperitoneum, ascites or focal fluid collection.  Musculoskeletal: No aggressive appearing focal osseous lesions.  IMPRESSION: 1. Interval growth of 8 mm solid medial left lower lobe pulmonary nodule, cannot exclude enlarging pulmonary metastasis. This nodule is slightly below PET resolution. Close chest CT surveillance suggested in 3 months. 2. Interval growth of 9 mm medial left upper lobe ground-glass pulmonary nodule. Cannot exclude primary bronchogenic adenocarcinoma. Continued close chest CT surveillance advised. 3. No additional potential findings of metastatic disease. 4. No evidence of local tumor recurrence in the left nephrectomy bed. 5. Aortic Atherosclerosis (ICD10-I70.0) and Emphysema (ICD10-J43.9). 6. Ectatic 2.8 cm infrarenal abdominal aorta, stable at risk for aneurysm development. Recommend follow-up aortic ultrasound in 5 years. This recommendation follows ACR consensus guidelines: White Paper of the ACR Incidental Findings Committee II on Vascular Findings. J Am Coll Radiol 2013; 10:789-794.  Electronically Signed   By: Ilona Sorrel M.D.   On: 02/13/2018 23:41 CT ABDOMEN PELVIS WO CONTRAST CLINICAL DATA:  Stage III renal cell carcinoma status post left nephrectomy. Restaging.  EXAM: CT CHEST, ABDOMEN AND PELVIS WITHOUT CONTRAST  TECHNIQUE: Multidetector CT imaging of the chest, abdomen and pelvis was performed following the standard protocol without IV contrast.  COMPARISON:  08/12/2017 CT chest, abdomen and pelvis.  FINDINGS: CT CHEST FINDINGS  Cardiovascular: Normal heart size. Stable trace pericardial effusion/thickening. Left anterior descending and right coronary atherosclerosis. Atherosclerotic nonaneurysmal thoracic aorta. Normal caliber pulmonary arteries.  Mediastinum/Nodes: No discrete thyroid nodules. Unremarkable esophagus. No pathologically enlarged axillary, mediastinal or  hilar lymph nodes, noting limited sensitivity for the detection of hilar adenopathy on this noncontrast study.  Lungs/Pleura: No pneumothorax. No pleural effusion. Mild centrilobular and paraseptal emphysema with diffuse bronchial wall thickening. Solid medial left lower lobe 8 mm pulmonary nodule (series 4/image 111), increased from 5 mm. Posterior left upper lobe 4 mm solid pulmonary nodule (series 4/image 56), stable. Subpleural 10 x 4 mm (average diameter 7 mm) anterior basilar right upper lobe solid pulmonary nodule (series 4/image 105), previously 10 x 5 mm  using similar measurement technique, stable. Ground-glass 0.9 cm medial left upper lobe pulmonary nodule (series 4/image 52), previously 0.6 cm using similar measurement technique, increased. Parenchymal bands in the dependent right middle and right upper lobe and left lung base are stable, compatible with postinfectious/postinflammatory scarring. No acute consolidative airspace disease, lung masses or new significant pulmonary nodules. Stable tiny calcified apical right upper lobe granuloma.  Musculoskeletal: No aggressive appearing focal osseous lesions. Stable chronic severe T12 vertebral compression fracture.  CT ABDOMEN PELVIS FINDINGS  Hepatobiliary: Diffuse hepatic steatosis. No liver mass. No definite liver surface irregularity. Cholecystectomy no biliary ductal dilatation.  Pancreas: Normal, with no mass or duct dilation.  Spleen: Normal size. No mass.  Adrenals/Urinary Tract: No adrenal nodules. Left nephrectomy. No mass or fluid collection in the left nephrectomy bed. No right renal stones. No right hydronephrosis. Simple right renal cysts, largest an exophytic 9.0 cm lateral interpolar right renal cyst. Slightly hyperdense 1.2 cm renal cortical lesion in the posterior right kidney (series 3/image 82), stable. No new contour deforming right renal lesions. Normal bladder.  Stomach/Bowel: Normal  non-distended stomach. Normal caliber small bowel with no small bowel wall thickening. Appendix not discretely visualized. Normal large bowel with no diverticulosis, large bowel wall thickening or pericolonic fat stranding.  Vascular/Lymphatic: Atherosclerotic abdominal aorta with stable 2.8 cm ectatic infrarenal abdominal aorta. No pathologically enlarged lymph nodes in the abdomen or pelvis.  Reproductive: Status post hysterectomy, with no abnormal findings at the vaginal cuff. No adnexal mass.  Other: No pneumoperitoneum, ascites or focal fluid collection.  Musculoskeletal: No aggressive appearing focal osseous lesions.  IMPRESSION: 1. Interval growth of 8 mm solid medial left lower lobe pulmonary nodule, cannot exclude enlarging pulmonary metastasis. This nodule is slightly below PET resolution. Close chest CT surveillance suggested in 3 months. 2. Interval growth of 9 mm medial left upper lobe ground-glass pulmonary nodule. Cannot exclude primary bronchogenic adenocarcinoma. Continued close chest CT surveillance advised. 3. No additional potential findings of metastatic disease. 4. No evidence of local tumor recurrence in the left nephrectomy bed. 5. Aortic Atherosclerosis (ICD10-I70.0) and Emphysema (ICD10-J43.9). 6. Ectatic 2.8 cm infrarenal abdominal aorta, stable at risk for aneurysm development. Recommend follow-up aortic ultrasound in 5 years. This recommendation follows ACR consensus guidelines: White Paper of the ACR Incidental Findings Committee II on Vascular Findings. J Am Coll Radiol 2013; 10:789-794.  Electronically Signed   By: Ilona Sorrel M.D.   On: 02/13/2018 23:41  Complexity Note: Imaging results reviewed. Results shared with Ms. Lightle, using Layman's terms.                         Meds   Current Outpatient Medications:  .  acetaminophen (TYLENOL) 500 MG tablet, Take 1,000 mg by mouth every 6 (six) hours as needed for mild pain, fever or  headache. , Disp: , Rfl:  .  Ascorbic Acid (VITAMIN C) 500 MG CHEW, One by mouth daily for 2-3 weeks, Disp: 30 each, Rfl: 0 .  atorvastatin (LIPITOR) 20 MG tablet, Take 1 tablet (20 mg total) by mouth at bedtime., Disp: 90 tablet, Rfl: 0 .  busPIRone (BUSPAR) 10 MG tablet, Take 10 mg by mouth 3 (three) times daily., Disp: , Rfl:  .  cholestyramine (QUESTRAN) 4 g packet, Take 1 packet (4 g total) by mouth 2 (two) times daily with a meal., Disp: 60 each, Rfl: 11 .  citalopram (CELEXA) 10 MG tablet, Take 1 tablet (10 mg total) by mouth daily., Disp:  90 tablet, Rfl: 0 .  clopidogrel (PLAVIX) 75 MG tablet, Take 1 tablet (75 mg total) by mouth daily., Disp: 90 tablet, Rfl: 3 .  donepezil (ARICEPT) 10 MG tablet, Take 1 tablet (10 mg total) by mouth at bedtime., Disp: 90 tablet, Rfl: 3 .  furosemide (LASIX) 20 MG tablet, Take 40 mg by mouth daily. , Disp: , Rfl:  .  gabapentin (NEURONTIN) 300 MG capsule, Take 1 capsule (300 mg total) by mouth 3 (three) times daily., Disp: 270 capsule, Rfl: 3 .  glucose blood test strip, Use as instructed., Disp: 100 each, Rfl: 12 .  HYDROcodone-acetaminophen (NORCO) 7.5-325 MG tablet, Take 1 tablet by mouth every 8 (eight) hours as needed for moderate pain or severe pain. To fill on or after: 04/14/2018, 05/13/2018, 06/12/2017, Disp: 90 tablet, Rfl: 0 .  ipratropium-albuterol (DUONEB) 0.5-2.5 (3) MG/3ML SOLN, INHALE THE CONTENTS OF 1 VIAL VIA NEBULIZER THREE TIMES DAILY AS DIRECTED, Disp: 810 mL, Rfl: 2 .  metoprolol tartrate (LOPRESSOR) 50 MG tablet, Take 1 tablet (50 mg total) by mouth 2 (two) times daily., Disp: 180 tablet, Rfl: 0 .  Nebulizers MISC, by Does not apply route., Disp: , Rfl:  .  pantoprazole (PROTONIX) 40 MG tablet, Take 1 tablet (40 mg total) by mouth daily., Disp: 90 tablet, Rfl: 1 .  QUEtiapine (SEROQUEL) 50 MG tablet, Take 50 mg by mouth at bedtime., Disp: , Rfl:  .  ranitidine (ZANTAC) 150 MG capsule, Take 1 capsule (150 mg total) by mouth at bedtime.,  Disp: 90 capsule, Rfl: 3 .  Zinc 50 MG TABS, One by mouth daily for 2-3 weeks, Disp: 30 tablet, Rfl: 0 .  albuterol (PROVENTIL HFA;VENTOLIN HFA) 108 (90 Base) MCG/ACT inhaler, Inhale 2 puffs into the lungs every 6 (six) hours as needed for wheezing or shortness of breath. (Patient not taking: Reported on 03/01/2018), Disp: 1 Inhaler, Rfl: 2 .  AMBULATORY NON FORMULARY MEDICATION, Medication Name: Flutter valve use after nebulizer treatment., Disp: 1 each, Rfl: 0 .  Vitamin D, Ergocalciferol, (DRISDOL) 50000 units CAPS capsule, Take 1 capsule (50,000 Units total) by mouth every 7 (seven) days. (Patient not taking: Reported on 03/28/2018), Disp: 4 capsule, Rfl: 1  ROS  Constitutional: Denies any fever or chills Gastrointestinal: No reported hemesis, hematochezia, vomiting, or acute GI distress Musculoskeletal: Denies any acute onset joint swelling, redness, loss of ROM, or weakness Neurological: No reported episodes of acute onset apraxia, aphasia, dysarthria, agnosia, amnesia, paralysis, loss of coordination, or loss of consciousness  Allergies  Ms. Matson is allergic to fentanyl and penicillins.  [place]  Drug: Ms. Heitman  reports that she does not use drugs. Alcohol:  reports that she drank alcohol. Tobacco:  reports that she quit smoking about 13 years ago. Her smoking use included cigarettes. She has a 76.50 pack-year smoking history. She has never used smokeless tobacco. Medical:  has a past medical history of Anemia, Anxiety, Arthritis, Bilateral cataracts, Bronchitis, Cataract, Chronic kidney disease, Depression, Diabetes mellitus without complication ([place]), DVT (deep venous thrombosis) ([place]), GERD (gastroesophageal reflux disease), Headache, History of frequent urinary tract infections, renal cell cancer, Hyperlipidemia, Hypertension, Numbness and tingling, Obesity, Osteopenia (10/05/2017), Peripheral artery disease ([place]), Urinary frequency, and Urinary incontinence. Surgical: Ms. Kurihara   has a past surgical history that includes Leg amputation; Abdominal hysterectomy (1978); Cholecystectomy; Carpal tunnel release (Bilateral); stent placement right leg ; Robot assisted laparoscopic nephrectomy (Left, 01/15/2015); Colonoscopy; Colonoscopy with esophagogastroduodenoscopy (egd); and ERCP (N/A, 08/19/2017). Family: family history includes Cancer in her brother, father,  and mother; Diabetes in her brother; Healthy in her sister and son.  Constitutional Exam  General appearance: Well nourished, well developed, and well hydrated. In no apparent acute distress Vitals:   03/28/18 1140  BP: 116/74  Pulse: 64  Resp: 18  Temp: 98 F (36.7 C)  TempSrc: Oral  SpO2: 96%  Weight: 184 lb (83.5 kg)  Height: 5' 3"  (1.6 m)   BMI Assessment: Estimated body mass index is 32.59 kg/m as calculated from the following:   Height as of this encounter: 5' 3"  (1.6 m).   Weight as of this encounter: 184 lb (83.5 kg).  BMI interpretation table: BMI level Category Range association with higher incidence of chronic pain  <18 kg/m2 Underweight   18.5-24.9 kg/m2 Ideal body weight   25-29.9 kg/m2 Overweight Increased incidence by 20%  30-34.9 kg/m2 Obese (Class I) Increased incidence by 68%  35-39.9 kg/m2 Severe obesity (Class II) Increased incidence by 136%  >40 kg/m2 Extreme obesity (Class III) Increased incidence by 254%   Patient's current BMI Ideal Body weight  Body mass index is 32.59 kg/m. Ideal body weight: 52.4 kg (115 lb 8.3 oz) Adjusted ideal body weight: 64.8 kg (142 lb 14.6 oz)   BMI Readings from Last 4 Encounters:  03/28/18 32.59 kg/m  03/01/18 32.59 kg/m  02/06/18 32.59 kg/m  12/27/17 33.65 kg/m   Wt Readings from Last 4 Encounters:  03/28/18 184 lb (83.5 kg)  02/06/18 184 lb (83.5 kg)  12/27/17 184 lb (83.5 kg)  12/23/17 184 lb (83.5 kg)  Psych/Mental status: Alert, oriented x 3 (person, place, & time)       Eyes: PERLA Respiratory: No evidence of acute respiratory  distress  Cervical Spine Area Exam  Skin & Axial Inspection: No masses, redness, edema, swelling, or associated skin lesions Alignment: Symmetrical Functional ROM: Unrestricted ROM      Stability: No instability detected Muscle Tone/Strength: Functionally intact. No obvious neuro-muscular anomalies detected. Sensory (Neurological): Unimpaired Palpation: No palpable anomalies              Upper Extremity (UE) Exam    Side: Right upper extremity  Side: Left upper extremity  Skin & Extremity Inspection: Skin color, temperature, and hair growth are WNL. No peripheral edema or cyanosis. No masses, redness, swelling, asymmetry, or associated skin lesions. No contractures.  Skin & Extremity Inspection: Skin color, temperature, and hair growth are WNL. No peripheral edema or cyanosis. No masses, redness, swelling, asymmetry, or associated skin lesions. No contractures.  Functional ROM: Unrestricted ROM          Functional ROM: Unrestricted ROM          Muscle Tone/Strength: Functionally intact. No obvious neuro-muscular anomalies detected.  Muscle Tone/Strength: Functionally intact. No obvious neuro-muscular anomalies detected.  Sensory (Neurological): Unimpaired          Sensory (Neurological): Unimpaired          Palpation: No palpable anomalies              Palpation: No palpable anomalies              Provocative Test(s):  Phalen's test: deferred Tinel's test: deferred Apley's scratch test (touch opposite shoulder):  Action 1 (Across chest): deferred Action 2 (Overhead): deferred Action 3 (LB reach): deferred   Provocative Test(s):  Phalen's test: deferred Tinel's test: deferred Apley's scratch test (touch opposite shoulder):  Action 1 (Across chest): deferred Action 2 (Overhead): deferred Action 3 (LB reach): deferred    Thoracic Spine Area  Exam  Skin & Axial Inspection: No masses, redness, or swelling Alignment: Symmetrical Functional ROM: Unrestricted ROM Stability: No  instability detected Muscle Tone/Strength: Functionally intact. No obvious neuro-muscular anomalies detected. Sensory (Neurological): Unimpaired Muscle strength & Tone: No palpable anomalies   Lumbar Spine Area Exam  Skin & Axial Inspection:No masses, redness, or swelling Alignment:Symmetrical Functional OJJ:[id number] ROM Stability:No instability detected Muscle Tone/Strength:Functionally intact. No obvious neuro-muscular anomalies detected. Sensory (Neurological):Unimpaired Palpation:No palpable anomalies Provocative Tests: Lumbar Hyperextension and rotation test:evaluation deferred today Lumbar Lateral bending test:evaluation deferred today Patrick's Maneuver:evaluation deferred today  Gait & Posture Assessment  Patient in wheelchair. Significant kyphosis.   Lower Extremity Exam    Side:Right lower extremity  Side:Left lower extremity  Skin & Extremity Inspection:Skin color, temperature, and hair growth are WNL. No peripheral edema or cyanosis. No masses, redness, swelling, asymmetry, or associated skin lesions. No contractures.  Skin & Extremity Inspection:Above knee amputation (AKA)  Functional JIR:[id number] ROM  Functional WCH:ENIDPOEUM ROM  Muscle Tone/Strength:Functionally intact. No obvious neuro-muscular anomalies detected.  Muscle Tone/Strength:Functionally intact. No obvious neuro-muscular anomalies detected.  Sensory (Neurological):Unimpaired  Sensory (Neurological):Paresthesia (Tingling sensation)  Palpation:No palpable anomalies  Palpation:No palpable anomalies     Assessment  Primary Diagnosis & Pertinent Problem List: The primary encounter diagnosis was Chronic pain syndrome. Diagnoses of Phantom limb syndrome with pain ([place]), CKD (chronic kidney disease) stage 4, GFR 15-29 ml/min (HCC), and Continuous opioid dependence ([place]) were also pertinent to this visit.  Status  Diagnosis  Controlled Controlled Controlled 1. Chronic pain syndrome   2. Phantom limb syndrome with pain ([place])   3. CKD (chronic kidney disease) stage 4, GFR 15-29 ml/min (HCC)   4. Continuous opioid dependence ([place])      General Recommendations: The pain condition that the patient suffers from is best treated with a multidisciplinary approach that involves an increase in physical activity to prevent de-conditioning and worsening of the pain cycle, as well as psychological counseling (formal and/or informal) to address the co-morbid psychological affects of pain. Treatment will often involve judicious use of pain medications and interventional procedures to decrease the pain, allowing the patient to participate in the physical activity that will ultimately produce long-lasting pain reductions. The goal of the multidisciplinary approach is to return the patient to a higher level of overall function and to restore their ability to perform activities of daily living.  82 year old female with a history of left above-the-knee amputation, phantom limb syndrome, chronic kidney disease, advanced dementia secondary to Alzheimer's disease with phantom limb pain and bilateral hip pain. Patient has been stable on her opioid regimen of hydrocodone 7.5 mg 3 times daily as needed. She is also on gabapentin 300 mg 3 times daily.Here for medication refill on her hydrocodoneand Gabapentin. PMP checked and appropriate.  Plan: -Hydrocodone at its current dose, 7.5 mg 3 times daily as needed. Prescription for73month -Continue gabapentin 300 mg 3 times daily, Rx provided below.  Plan of Care  Pharmacotherapy (Medications Ordered): Meds ordered this encounter  Medications  . DISCONTD: HYDROcodone-acetaminophen (NORCO) 7.5-325 MG tablet    Sig: Take 1 tablet by mouth every 8 (eight) hours as needed for moderate pain or severe pain. To fill on or after: 04/14/2018, 05/13/2018, 06/12/2017    Dispense:  90  tablet    Refill:  0    Do not place this medication, or any other prescription from our practice, on "Automatic Refill". Patient may have prescription filled one day early if pharmacy is closed on scheduled refill date.  .Marland KitchenDISCONTD:  HYDROcodone-acetaminophen (NORCO) 7.5-325 MG tablet    Sig: Take 1 tablet by mouth every 8 (eight) hours as needed for moderate pain or severe pain. To fill on or after: 04/14/2018, 05/13/2018, 06/12/2017    Dispense:  90 tablet    Refill:  0    Do not place this medication, or any other prescription from our practice, on "Automatic Refill". Patient may have prescription filled one day early if pharmacy is closed on scheduled refill date.  [name] HYDROcodone-acetaminophen (NORCO) 7.5-325 MG tablet    Sig: Take 1 tablet by mouth every 8 (eight) hours as needed for moderate pain or severe pain. To fill on or after: 04/14/2018, 05/13/2018, 06/12/2017    Dispense:  90 tablet    Refill:  0    Do not place this medication, or any other prescription from our practice, on "Automatic Refill". Patient may have prescription filled one day early if pharmacy is closed on scheduled refill date.  [name] DISCONTD: gabapentin (NEURONTIN) 300 MG capsule    Sig: Take 1 capsule (300 mg total) by mouth 3 (three) times daily.    Dispense:  270 capsule    Refill:  3  . gabapentin (NEURONTIN) 300 MG capsule    Sig: Take 1 capsule (300 mg total) by mouth 3 (three) times daily.    Dispense:  270 capsule    Refill:  3   Lab-work, procedure(s), and/or referral(s): Orders Placed This Encounter  Procedures  . Drug Screen 10 W/Conf, Serum     Provider-requested follow-up: Return in about 3 months (around 06/28/2018) for Medication Management.  Future Appointments  Date Time Provider [place]  03/30/2018 11:40 AM Arnetha Courser, MD [place] [facility]  05/26/2018 11:00 AM [facility]  05/31/2018  3:30 PM Lequita Asal, MD [facility] None  06/30/2018  9:00 AM [facility] VASC 2 [facility]  None  06/30/2018 10:00 AM [facility] VASC 2 [facility] None  06/30/2018 10:30 AM Dew, Erskine Squibb, MD [facility] None  07/04/2018 11:30 AM Gillis Santa, MD [facility] None  10/03/2018  2:00 PM [place] ADVISOR [place]    Primary Care Physician: Arnetha Courser, MD Location: [facility] Note by: Gillis Santa, M.D Date: 03/28/2018; Time: 1:31 PM  Patient Instructions  You have been given 3 scripts for Hydrocodone and 1 script for Gabapentin today.  You have been sent to do a Serum UDS>

## 2018-03-28 NOTE — Progress Notes (Addendum)
Nursing Pain Medication Assessment:  Safety precautions to be maintained throughout the outpatient stay will include: orient to surroundings, keep bed in low position, maintain call bell within reach at all times, provide assistance with transfer out of bed and ambulation.  Medication Inspection Compliance: Pill count conducted under aseptic conditions, in front of the patient. Neither the pills nor the bottle was removed from the patient's sight at any time. Once count was completed pills were immediately returned to the patient in their original bottle.  Medication: Hydrocodone/APAP Pill/Patch Count: 40 of 90 pills remain Pill/Patch Appearance: Markings consistent with prescribed medication Bottle Appearance: Standard pharmacy container. Clearly labeled. Filled Date: 10 / 23 / 2019 Last Medication intake:  Today   Pt states she has 16 pills at home in pill counter.

## 2018-03-28 NOTE — Patient Instructions (Signed)
You have been given 3 scripts for Hydrocodone and 1 script for Gabapentin today.  You have been sent to do a Serum UDS>

## 2018-03-30 ENCOUNTER — Encounter: Payer: Self-pay | Admitting: Family Medicine

## 2018-03-30 ENCOUNTER — Ambulatory Visit (INDEPENDENT_AMBULATORY_CARE_PROVIDER_SITE_OTHER): Payer: Medicare HMO | Admitting: Family Medicine

## 2018-03-30 ENCOUNTER — Other Ambulatory Visit: Payer: Self-pay | Admitting: Family Medicine

## 2018-03-30 VITALS — BP 108/60 | HR 54 | Temp 97.8°F | Ht 63.0 in

## 2018-03-30 DIAGNOSIS — R05 Cough: Secondary | ICD-10-CM | POA: Diagnosis not present

## 2018-03-30 DIAGNOSIS — D649 Anemia, unspecified: Secondary | ICD-10-CM

## 2018-03-30 DIAGNOSIS — K219 Gastro-esophageal reflux disease without esophagitis: Secondary | ICD-10-CM

## 2018-03-30 DIAGNOSIS — R059 Cough, unspecified: Secondary | ICD-10-CM

## 2018-03-30 DIAGNOSIS — N184 Chronic kidney disease, stage 4 (severe): Secondary | ICD-10-CM

## 2018-03-30 MED ORDER — DOXYCYCLINE HYCLATE 100 MG PO TABS: 100.0000 mg | ORAL_TABLET | Freq: Two times a day (BID) | ORAL | 0 refills | Status: DC

## 2018-03-30 NOTE — Progress Notes (Signed)
BP 108/60   Pulse (!) 54   Temp 97.8 F (36.6 C)   Ht 5\' 3"  (1.6 m)   SpO2 93%   BMI 32.59 kg/m    Subjective:    Patient ID: Amy [name], female    DOB: 11/13/1935, 82 y.o.   MRN: [id number]  HPI: Amy [name] is a 82 y.o. female  Chief Complaint  Patient presents with  . Follow-up    HPI Here with husband They went to pain management and they wanted a urine specimen for medicine monitoring; they did blood work at Dr. Assunta Gambles office and again through pain management, 3 tubes drawn yesterday  Mild anemia noted in October; Hbg was 11.8; no blood in the urine or the stool; no nosebleeds; no gum bleeding; ferritin was normal at 22; loves turnip greens, once a month maybe  CKD; going to see kidney specialist soon in a few weeks; Cr improved from 2.33 to 1.69; her total protein and albumin were low; nephrologist checked this  Some coughing; a little rattly; about a month ago; no fevers; just had chest CT in September, seeing Dr. Mike Gip; they recommended another CT scan in 3 months; spots were too small to get an autopsy on (he meant biopsy)  Depression screen [facility] 2/9 03/28/2018 02/15/2018 02/03/2018 12/27/2017 12/08/2017  Decreased Interest 0 0 0 1 0  Down, Depressed, Hopeless 0 0 1 0 1  PHQ - 2 Score 0 0 1 1 1   Altered sleeping - 0 1 - -  Tired, decreased energy - 0 0 - -  Change in appetite - 0 1 - -  Feeling bad or failure about yourself  - 0 0 - -  Trouble concentrating - 0 1 - -  Moving slowly or fidgety/restless - 0 0 - -  Suicidal thoughts - 0 0 - -  PHQ-9 Score - 0 4 - -  Difficult doing work/chores - Not difficult at all Not difficult at all - -  Some recent data might be hidden   Fall Risk  03/30/2018 03/28/2018 02/15/2018 02/03/2018 12/27/2017  Falls in the past year? 1 - Yes Yes Yes  Comment - - - - -  Number falls in past yr: 1 1 2  or more 2 or more 2 or more  Injury with Fall? 0 0 Yes Yes No  Risk Factor Category  - - High Fall Risk High Fall Risk  High Fall Risk  Risk for fall due to : - - - - -  Follow up - - - - Falls evaluation completed;Education provided    Relevant past medical, surgical, family and social history reviewed Past Medical History:  Diagnosis Date  . Anemia    low iron  . Anxiety   . Arthritis   . Bilateral cataracts   . Bronchitis    hx of   . Cataract   . Chronic kidney disease   . Depression   . Diabetes mellitus without complication ([place])   . 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 GERD (gastroesophageal reflux disease)   . Headache   . History of frequent urinary tract infections   . Hx of renal cell cancer    LEFT  . Hyperlipidemia   . Hypertension   . Numbness and tingling    right hand   . Obesity   . Osteopenia 10/05/2017  . Peripheral artery disease ([place])   . Urinary  frequency   . Urinary incontinence    Past Surgical History:  Procedure Laterality Date  . ABDOMINAL HYSTERECTOMY  1978  . CARPAL TUNNEL RELEASE Bilateral   . CHOLECYSTECTOMY    . COLONOSCOPY    . COLONOSCOPY WITH ESOPHAGOGASTRODUODENOSCOPY (EGD)    . ERCP N/A 08/19/2017   Procedure: ENDOSCOPIC RETROGRADE CHOLANGIOPANCREATOGRAPHY (ERCP);  Surgeon: Arta Silence, MD;  [facility] END[facility]COPY;  Service: Endoscopy;  Laterality: N/A;  . LEG AMPUTATION     above the knee / left   . ROBOT ASSISTED LAPAROSCOPIC NEPHRECTOMY Left 01/15/2015   Procedure: ROBOTIC ASSISTED LAPAROSCOPIC RADICAL NEPHRECTOMY;  Surgeon: Alexis Frock, MD;  Location: [facility] ORS;  Service: Urology;  Laterality: Left;  . stent placement right leg      Family History  Problem Relation Age of Onset  . Cancer Mother        Stomach  . Cancer Father   . Diabetes Brother   . Cancer Brother        oral  . Healthy Son   . Healthy Sister    Social History   Tobacco Use  . Smoking status: Former Smoker    Packs/day: 1.50    Years: 51.00    Pack years: 76.50    Types: Cigarettes    Last attempt to quit:  05/24/2004    Years since quitting: 13.9  . Smokeless tobacco: Never Used  . Tobacco comment: smoking cessation materials not required  Substance Use Topics  . Alcohol use: Not Currently  . Drug use: No     [facility] Visit from 03/30/2018 in [facility]  AUDIT-C Score  0      Interim medical history since last visit reviewed. Allergies and medications reviewed  Review of Systems Per HPI unless specifically indicated above     Objective:    BP 108/60   Pulse (!) 54   Temp 97.8 F (36.6 C)   Ht 5\' 3"  (1.6 m)   SpO2 93%   BMI 32.59 kg/m     Physical Exam  Constitutional: She appears well-developed and well-nourished.  HENT:  Mouth/Throat: Mucous membranes are normal.  Eyes: EOM are normal. No scleral icterus.  Cardiovascular: Regular rhythm. Bradycardia present.  Pulmonary/Chest: Effort normal and breath sounds normal.  Psychiatric: She has a normal mood and affect. Her behavior is normal.        Assessment & Plan:   Problem List Items Addressed This Visit      Genitourinary   CKD (chronic kidney disease) stage 4, GFR 15-29 ml/min (HCC)    Last Cr improved from 2.33 to 1.69; seeing nephrologist; avoid NSAIDs; hydrate        Other   Anemia    Monitored by nephrologist, may be anemia or chronic renal disease       Other Visit Diagnoses    Cough    -  Primary   will treat empirically, doxy b/c no renal adjustment; to UC or ER if worsening       Follow up plan: Return in about 3 months (around 06/30/2018) for follow-up visit with Dr. Sanda Klein.  An after-visit summary was printed and given to the patient at [place].  Please see the patient instructions which may contain other information and recommendations beyond what is mentioned above in the assessment and plan.  Meds ordered this encounter  Medications  . DISCONTD: doxycycline (VIBRA-TABS) 100 MG tablet    Sig: Take 1 tablet (100 mg total) by mouth 2 (two) times daily.  Dispense:  20  tablet    Refill:  0    No orders of the defined types were placed in this encounter.

## 2018-03-30 NOTE — Patient Instructions (Addendum)
Request labs from Dr. Juleen China, blood and urine, plus notes Stop the citalopram, but let me know if mood worsens If your cough worsens, start the antibiotics Call us at night or on the weekend to discuss how you're feeling or if you have any questions

## 2018-04-02 LAB — OPIATES,MS,WB/SP RFX
6-ACETYLMORPHINE: NEGATIVE
Codeine: NEGATIVE ng/mL
DIHYDROCODEINE: 10.1 ng/mL
HYDROCODONE: 48.3 ng/mL
Hydromorphone: NEGATIVE ng/mL
MORPHINE: NEGATIVE ng/mL
OPIATE CONFIRMATION: POSITIVE

## 2018-04-02 LAB — DRUG SCREEN 10 W/CONF, SERUM
Amphetamines, IA: NEGATIVE ng/mL
BARBITURATES, IA: NEGATIVE ug/mL
BENZODIAZEPINES, IA: NEGATIVE ng/mL
Cocaine & Metabolite, IA: NEGATIVE ng/mL
Methadone, IA: NEGATIVE ng/mL
Opiates, IA: POSITIVE ng/mL
Oxycodones, IA: NEGATIVE ng/mL
PHENCYCLIDINE, IA: NEGATIVE ng/mL
PROPOXYPHENE, IA: NEGATIVE ng/mL
THC(MARIJUANA) METABOLITE, IA: NEGATIVE ng/mL

## 2018-04-02 LAB — OXYCODONES,MS,WB/SP RFX
Oxycocone: NEGATIVE ng/mL
Oxycodones Confirmation: NEGATIVE
Oxymorphone: NEGATIVE ng/mL

## 2018-04-05 ENCOUNTER — Telehealth: Payer: Self-pay | Admitting: Family Medicine

## 2018-04-05 MED ORDER — FAMOTIDINE 20 MG PO TABS: 20.0000 mg | ORAL_TABLET | Freq: Every day | ORAL | 1 refills | Status: DC

## 2018-04-05 NOTE — Telephone Encounter (Signed)
Copied from [place] [id number]. Topic: Quick Communication - Rx Refill/Question >> Apr 05, 2018  9:52 AM Marin Olp L wrote: Medication: ranitadine(zantac) has been recalled, needs an alternate like  (Famotidine which is covered)  Has the patient contacted their pharmacy? Yes.   (Agent: If no, request that the patient contact the pharmacy for the refill.) (Agent: If yes, when and what did the pharmacy advise?)  Preferred Pharmacy (with phone number or street name): [facility] [place], [place] [address] Phone: [phone] Fax: [phone]    Agent: Please be advised that RX refills may take up to 3 business days. We ask that you follow-up with your pharmacy.

## 2018-04-05 NOTE — Telephone Encounter (Signed)
Rx for famotidine approved

## 2018-04-06 ENCOUNTER — Telehealth: Payer: Self-pay | Admitting: Family Medicine

## 2018-04-06 DIAGNOSIS — N183 Chronic kidney disease, stage 3 (moderate): Secondary | ICD-10-CM | POA: Diagnosis not present

## 2018-04-06 DIAGNOSIS — I129 Hypertensive chronic kidney disease with stage 1 through stage 4 chronic kidney disease, or unspecified chronic kidney disease: Secondary | ICD-10-CM | POA: Diagnosis not present

## 2018-04-06 DIAGNOSIS — N2581 Secondary hyperparathyroidism of renal origin: Secondary | ICD-10-CM | POA: Diagnosis not present

## 2018-04-06 DIAGNOSIS — R809 Proteinuria, unspecified: Secondary | ICD-10-CM | POA: Diagnosis not present

## 2018-04-06 NOTE — Telephone Encounter (Signed)
Thank you so much

## 2018-04-06 NOTE — Telephone Encounter (Signed)
Called, patient's son is there taking care of her.

## 2018-04-06 NOTE — Telephone Encounter (Signed)
Husband hospitalized Please contact patient and see if there are any needs

## 2018-04-07 ENCOUNTER — Telehealth: Payer: Self-pay | Admitting: Family Medicine

## 2018-04-07 DIAGNOSIS — Z6379 Other stressful life events affecting family and household: Secondary | ICD-10-CM

## 2018-04-07 NOTE — Telephone Encounter (Signed)
Doug notified placing C3 referral for help

## 2018-04-07 NOTE — Telephone Encounter (Signed)
We can talk to the patient right now Please call her and offer help Put in referral for either C3 care team or our CCM group here to arrange for assistance based on her needs

## 2018-04-07 NOTE — Telephone Encounter (Signed)
Pts step son, Marden Noble came in this morning asking for a message be sent to Dr Sanda Klein about getting help caring for pt while her spouse is in the hospital. It was explained to [facility] that unfortunately at this time we could not speak to him or his siblings about the care for his step mom until we have a updated DPR on file for pt, with his name/their name added to the [facility]. Marden Noble was given DPR's for both dad and step mom, and will get those back to [place] ASAP. Until then they are asking for a call to be made to pts husband Mr. Perno @ [phone], this is not his # but his sons # who is at his bedside at [facility]. Once DPR's are brought back in they will be scanned in patients chart. Please advise.

## 2018-04-09 DIAGNOSIS — J42 Unspecified chronic bronchitis: Secondary | ICD-10-CM | POA: Diagnosis not present

## 2018-04-09 DIAGNOSIS — Z89612 Acquired absence of left leg above knee: Secondary | ICD-10-CM | POA: Diagnosis not present

## 2018-04-09 DIAGNOSIS — S31125A Laceration of abdominal wall with foreign body, periumbilic region without penetration into peritoneal cavity, initial encounter: Secondary | ICD-10-CM | POA: Diagnosis not present

## 2018-04-11 ENCOUNTER — Ambulatory Visit (INDEPENDENT_AMBULATORY_CARE_PROVIDER_SITE_OTHER): Payer: Medicare HMO | Admitting: Nurse Practitioner

## 2018-04-11 ENCOUNTER — Encounter: Payer: Self-pay | Admitting: Nurse Practitioner

## 2018-04-11 VITALS — BP 120/70 | HR 62 | Temp 98.7°F | Resp 16 | Ht 63.0 in

## 2018-04-11 DIAGNOSIS — Z111 Encounter for screening for respiratory tuberculosis: Secondary | ICD-10-CM

## 2018-04-11 DIAGNOSIS — F0281 Dementia in other diseases classified elsewhere with behavioral disturbance: Secondary | ICD-10-CM | POA: Diagnosis not present

## 2018-04-11 DIAGNOSIS — L89322 Pressure ulcer of left buttock, stage 2: Secondary | ICD-10-CM

## 2018-04-11 DIAGNOSIS — G301 Alzheimer's disease with late onset: Secondary | ICD-10-CM

## 2018-04-11 DIAGNOSIS — F02818 Dementia in other diseases classified elsewhere, unspecified severity, with other behavioral disturbance: Secondary | ICD-10-CM

## 2018-04-11 NOTE — Telephone Encounter (Signed)
Pt here for an appt for FL2 forms

## 2018-04-11 NOTE — Progress Notes (Addendum)
tName: Amy [name]   MRN: [id number]    DOB: 12/04/1935   Date:04/11/2018       Progress Note  Subjective  Chief Complaint  Chief Complaint  Patient presents with  . Form Completion    FL2  . tb screening    HPI  Patient presents with son(s)- Hazle Quant, husband who is caregiver is in the hospital. Here today for TB screening and to fill out FL-2 paperwork. Patient has stage 2 decubitus ulcer on buttocks. Patient is alert to person, place and situtation presently. Disoriented to time. Patient I wheel chair bound, is AKA. She can push with leg to get around but typically is wheeled around. Patient does not try to get out of wheel chair or recliner. She does have some intermittent confusion. She is not aggressive to self or others. She is able to feed herself but needs assistance with bathing and dressing. She is able to follow simple directions.   Patient Active Problem List   Diagnosis Date Noted  . Hypertensive renal disease 11/08/2017  . Secondary hyperparathyroidism of renal origin ([place]) 11/08/2017  . QT prolongation 10/27/2017  . Osteopenia 10/05/2017  . Choledocholithiasis 08/13/2017  . Chronic pain syndrome 04/05/2017  . Depression 11/15/2016  . Iron deficiency anemia 09/08/2016  . Anemia 06/29/2016  . Late onset Alzheimer's disease with behavioral disturbance ([place]) 06/14/2016  . S/P AKA (above knee amputation) unilateral, left ([place]) 06/08/2016  . Wheelchair dependent 03/11/2016  . 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 ([place]) 03/13/2015  . S/p nephrectomy 01/29/2015  . Renal neoplasm 01/15/2015  . Abnormal EKG 01/02/2015  . Renal mass 12/05/2014  . GERD (gastroesophageal reflux disease) 11/20/2014  . GAD (generalized anxiety disorder) 11/20/2014  . AB (asthmatic bronchitis) 11/20/2014  . Aortic atherosclerosis ([place]) 11/20/2014  . Cataract 11/20/2014  . Leg pain 11/20/2014  . Continuous opioid dependence ([place]) 11/20/2014  . CKD (chronic kidney  disease) stage 4, GFR 15-29 ml/min (HCC) 11/20/2014  . COPD, mild ([place]) 11/20/2014  . Excessive falling 11/20/2014  . Fatty infiltration of liver 11/20/2014  . Hyperlipidemia 11/20/2014  . Anterior knee pain 11/20/2014  . LBP (low back pain) 11/20/2014  . Malaise and fatigue 11/20/2014  . Abnormal presence of protein in urine 11/20/2014  . PVD (peripheral vascular disease) ([place]) 11/20/2014  . Allergic rhinitis 11/20/2014  . At risk for falling 11/20/2014  . Phantom limb syndrome with pain ([place]) 11/05/2014  . Anxiety 11/05/2014  . Elevated serum creatinine 11/05/2014  . Diabetes mellitus type 2, controlled ([place]) 11/05/2014  . H/O adenomatous polyp of colon 07/27/2010    Past Medical History:  Diagnosis Date  . Anemia    low iron  . Anxiety   . Arthritis   . Bilateral cataracts   . Bronchitis    hx of   . Cataract   . Chronic kidney disease   . Depression   . Diabetes mellitus without complication ([place])   . 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 GERD (gastroesophageal reflux disease)   . Headache   . History of frequent urinary tract infections   . Hx of renal cell cancer    LEFT  . Hyperlipidemia   . Hypertension   . Numbness and tingling    right hand   . Obesity   . Osteopenia 10/05/2017  . Peripheral artery disease ([place])   . Urinary frequency   . Urinary incontinence  Past Surgical History:  Procedure Laterality Date  . ABDOMINAL HYSTERECTOMY  1978  . CARPAL TUNNEL RELEASE Bilateral   . CHOLECYSTECTOMY    . COLONOSCOPY    . COLONOSCOPY WITH ESOPHAGOGASTRODUODENOSCOPY (EGD)    . ERCP N/A 08/19/2017   Procedure: ENDOSCOPIC RETROGRADE CHOLANGIOPANCREATOGRAPHY (ERCP);  Surgeon: Arta Silence, MD;  Location: [facility] ENDOSCOPY;  Service: Endoscopy;  Laterality: N/A;  . LEG AMPUTATION     above the knee / left   . ROBOT ASSISTED LAPAROSCOPIC NEPHRECTOMY Left 01/15/2015   Procedure: ROBOTIC ASSISTED LAPAROSCOPIC RADICAL  NEPHRECTOMY;  Surgeon: Alexis Frock, MD;  Location: [facility] ORS;  Service: Urology;  Laterality: Left;  . stent placement right leg       Social History   Tobacco Use  . Smoking status: Former Smoker    Packs/day: 1.50    Years: 51.00    Pack years: 76.50    Types: Cigarettes    Last attempt to quit: 05/24/2004    Years since quitting: 13.8  . Smokeless tobacco: Never Used  . Tobacco comment: smoking cessation materials not required  Substance Use Topics  . Alcohol use: Not Currently     Current Outpatient Medications:  .  acetaminophen (TYLENOL) 500 MG tablet, Take 1,000 mg by mouth every 6 (six) hours as needed for mild pain, fever or headache. , Disp: , Rfl:  .  albuterol (PROVENTIL HFA;VENTOLIN HFA) 108 (90 Base) MCG/ACT inhaler, Inhale 2 puffs into the lungs every 6 (six) hours as needed for wheezing or shortness of breath., Disp: 1 Inhaler, Rfl: 2 .  AMBULATORY NON FORMULARY MEDICATION, Medication Name: Flutter valve use after nebulizer treatment., Disp: 1 each, Rfl: 0 .  Ascorbic Acid (VITAMIN C) 500 MG CHEW, One by mouth daily for 2-3 weeks, Disp: 30 each, Rfl: 0 .  atorvastatin (LIPITOR) 20 MG tablet, Take 1 tablet (20 mg total) by mouth at bedtime., Disp: 90 tablet, Rfl: 0 .  busPIRone (BUSPAR) 10 MG tablet, Take 10 mg by mouth 3 (three) times daily., Disp: , Rfl:  .  cholestyramine (QUESTRAN) 4 g packet, Take 1 packet (4 g total) by mouth 2 (two) times daily with a meal. (Patient taking differently: Take 4 g by mouth as needed. ), Disp: 60 each, Rfl: 11 .  clopidogrel (PLAVIX) 75 MG tablet, Take 1 tablet (75 mg total) by mouth daily., Disp: 90 tablet, Rfl: 3 .  donepezil (ARICEPT) 10 MG tablet, Take 1 tablet (10 mg total) by mouth at bedtime., Disp: 90 tablet, Rfl: 3 .  doxycycline (VIBRA-TABS) 100 MG tablet, Take 1 tablet (100 mg total) by mouth 2 (two) times daily., Disp: 20 tablet, Rfl: 0 .  famotidine (PEPCID) 20 MG tablet, Take 1 tablet (20 mg total) by mouth at bedtime. To  help prevent heartburn, reflux, Disp: 90 tablet, Rfl: 1 .  furosemide (LASIX) 20 MG tablet, Take 40 mg by mouth daily. , Disp: , Rfl:  .  gabapentin (NEURONTIN) 300 MG capsule, Take 1 capsule (300 mg total) by mouth 3 (three) times daily., Disp: 270 capsule, Rfl: 3 .  glucose blood test strip, Use as instructed., Disp: 100 each, Rfl: 12 .  HYDROcodone-acetaminophen (NORCO) 7.5-325 MG tablet, Take 1 tablet by mouth every 8 (eight) hours as needed for moderate pain or severe pain. To fill on or after: 04/14/2018, 05/13/2018, 06/12/2017, Disp: 90 tablet, Rfl: 0 .  ipratropium-albuterol (DUONEB) 0.5-2.5 (3) MG/3ML SOLN, INHALE THE CONTENTS OF 1 VIAL VIA NEBULIZER THREE TIMES DAILY AS DIRECTED, Disp: 810 mL, Rfl:  2 .  metoprolol tartrate (LOPRESSOR) 50 MG tablet, Take 1 tablet (50 mg total) by mouth 2 (two) times daily., Disp: 180 tablet, Rfl: 0 .  Nebulizers MISC, by Does not apply route., Disp: , Rfl:  .  pantoprazole (PROTONIX) 40 MG tablet, Take 1 tablet (40 mg total) by mouth daily., Disp: 90 tablet, Rfl: 1 .  QUEtiapine (SEROQUEL) 50 MG tablet, Take 50 mg by mouth at bedtime., Disp: , Rfl:  .  Zinc 50 MG TABS, One by mouth daily for 2-3 weeks, Disp: 30 tablet, Rfl: 0  Allergies  Allergen Reactions  . Fentanyl Other (See Comments)    urine retention  . Penicillins Itching, Rash and Other (See Comments)    Has patient had a PCN reaction causing immediate rash, facial/tongue/throat swelling, SOB or lightheadedness with hypotension: Yes Has patient had a PCN reaction causing severe rash involving mucus membranes or skin necrosis: No Has patient had a PCN reaction that required hospitalization: No Has patient had a PCN reaction occurring within the last 10 years: Yes If all of the above answers are "NO", then may proceed with Cephalosporin use.    ROS   No other specific complaints in a complete review of systems (except as listed in HPI above).  Objective  Vitals:   04/11/18 1423  BP:  120/70  Pulse: 62  Resp: 16  Temp: 98.7 F (37.1 C)  TempSrc: Oral  SpO2: 94%  Height: 5\' 3"  (1.6 m)    Body mass index is 32.59 kg/m.  Nursing Note and Vital Signs reviewed.  Physical Exam  Constitutional: She appears well-developed and well-nourished.  HENT:  Head: Normocephalic and atraumatic.  Eyes: Pupils are equal, round, and reactive to light.  Neck: Normal range of motion. Neck supple.  Cardiovascular: Normal rate.  Pulmonary/Chest: Effort normal.  Abdominal: Soft. Bowel sounds are normal.  Neurological: She is alert.  Skin:     Psychiatric: She has a normal mood and affect. Her behavior is normal.       No results found for this or any previous visit (from the past 48 hour(s)).  Assessment & Plan  1. Screening for tuberculosis - QuantiFERON-TB Gold Plus  2. Late onset Alzheimer's disease with behavioral disturbance ([place]) Stable; follow with neurology   3. Pressure injury of left buttock, stage 2 ([place]) Referred to home health    Filled out FL2 paperwork, cosigned by Dr. Sanda Klein.

## 2018-04-11 NOTE — Telephone Encounter (Addendum)
Please call Doug son on his cell phone [phone] to set up this referral.  Marden Noble hopes you can move quickly on this referral as they are struggling to take care of the pt on a daily basis.

## 2018-04-12 ENCOUNTER — Telehealth: Payer: Self-pay

## 2018-04-12 ENCOUNTER — Other Ambulatory Visit: Payer: Self-pay | Admitting: Nurse Practitioner

## 2018-04-12 DIAGNOSIS — L89322 Pressure ulcer of left buttock, stage 2: Secondary | ICD-10-CM

## 2018-04-12 NOTE — Telephone Encounter (Signed)
Completed.

## 2018-04-12 NOTE — Telephone Encounter (Signed)
Place referral for home health wound care for bed sore

## 2018-04-13 LAB — QUANTIFERON-TB GOLD PLUS
Mitogen-NIL: >10 IU/mL
NIL: 0.06 [IU]/mL
QuantiFERON-TB Gold Plus: NEGATIVE
TB1-NIL: <0 IU/mL

## 2018-04-14 ENCOUNTER — Telehealth: Payer: Self-pay | Admitting: Family Medicine

## 2018-04-14 ENCOUNTER — Other Ambulatory Visit: Payer: Self-pay | Admitting: Family Medicine

## 2018-04-14 DIAGNOSIS — E1151 Type 2 diabetes mellitus with diabetic peripheral angiopathy without gangrene: Secondary | ICD-10-CM | POA: Diagnosis not present

## 2018-04-14 DIAGNOSIS — R41841 Cognitive communication deficit: Secondary | ICD-10-CM | POA: Diagnosis not present

## 2018-04-14 DIAGNOSIS — E781 Pure hyperglyceridemia: Secondary | ICD-10-CM

## 2018-04-14 DIAGNOSIS — F0280 Dementia in other diseases classified elsewhere without behavioral disturbance: Secondary | ICD-10-CM | POA: Diagnosis not present

## 2018-04-14 DIAGNOSIS — G301 Alzheimer's disease with late onset: Secondary | ICD-10-CM | POA: Diagnosis not present

## 2018-04-14 DIAGNOSIS — K219 Gastro-esophageal reflux disease without esophagitis: Secondary | ICD-10-CM

## 2018-04-14 DIAGNOSIS — I129 Hypertensive chronic kidney disease with stage 1 through stage 4 chronic kidney disease, or unspecified chronic kidney disease: Secondary | ICD-10-CM | POA: Diagnosis not present

## 2018-04-14 DIAGNOSIS — L89322 Pressure ulcer of left buttock, stage 2: Secondary | ICD-10-CM | POA: Diagnosis not present

## 2018-04-14 DIAGNOSIS — F329 Major depressive disorder, single episode, unspecified: Secondary | ICD-10-CM | POA: Diagnosis not present

## 2018-04-14 DIAGNOSIS — N184 Chronic kidney disease, stage 4 (severe): Secondary | ICD-10-CM | POA: Diagnosis not present

## 2018-04-14 DIAGNOSIS — R531 Weakness: Secondary | ICD-10-CM | POA: Diagnosis not present

## 2018-04-14 NOTE — Telephone Encounter (Signed)
Faxed to [facility]

## 2018-04-14 NOTE — Telephone Encounter (Signed)
Copied from [employer]. Topic: General - Inquiry >> Apr 14, 2018  2:39 PM Conception Chancy, NT wrote: Reason for CRM: patient son Marden Noble is calling and states he is needing to get his mothers TB lab results so that they can get her into a assisted living facility. Please advise.

## 2018-04-17 DIAGNOSIS — R41841 Cognitive communication deficit: Secondary | ICD-10-CM | POA: Diagnosis not present

## 2018-04-17 DIAGNOSIS — L89322 Pressure ulcer of left buttock, stage 2: Secondary | ICD-10-CM | POA: Diagnosis not present

## 2018-04-17 DIAGNOSIS — E1151 Type 2 diabetes mellitus with diabetic peripheral angiopathy without gangrene: Secondary | ICD-10-CM | POA: Diagnosis not present

## 2018-04-17 DIAGNOSIS — N184 Chronic kidney disease, stage 4 (severe): Secondary | ICD-10-CM | POA: Diagnosis not present

## 2018-04-17 DIAGNOSIS — R531 Weakness: Secondary | ICD-10-CM | POA: Diagnosis not present

## 2018-04-17 DIAGNOSIS — G301 Alzheimer's disease with late onset: Secondary | ICD-10-CM | POA: Diagnosis not present

## 2018-04-17 DIAGNOSIS — I129 Hypertensive chronic kidney disease with stage 1 through stage 4 chronic kidney disease, or unspecified chronic kidney disease: Secondary | ICD-10-CM | POA: Diagnosis not present

## 2018-04-17 DIAGNOSIS — F0280 Dementia in other diseases classified elsewhere without behavioral disturbance: Secondary | ICD-10-CM | POA: Diagnosis not present

## 2018-04-17 DIAGNOSIS — F329 Major depressive disorder, single episode, unspecified: Secondary | ICD-10-CM | POA: Diagnosis not present

## 2018-04-17 NOTE — Telephone Encounter (Signed)
Info faxed

## 2018-04-17 NOTE — Telephone Encounter (Signed)
Lisa from [facility] called and stated that fax was not received. Please re fax [phone]

## 2018-04-17 NOTE — Assessment & Plan Note (Signed)
Monitored by nephrologist, may be anemia or chronic renal disease

## 2018-04-17 NOTE — Telephone Encounter (Signed)
Amy [name] from [facility] called today and states they have not received the requested information . Please refax to  # [phone] please call once faxed  her at [phone] , She also states they need FL2 form and the TB , the patient is supposed to move in today

## 2018-04-17 NOTE — Assessment & Plan Note (Signed)
Last Cr improved from 2.33 to 1.69; seeing nephrologist; avoid NSAIDs; hydrate

## 2018-04-18 DIAGNOSIS — Z89612 Acquired absence of left leg above knee: Secondary | ICD-10-CM | POA: Diagnosis not present

## 2018-04-18 DIAGNOSIS — J42 Unspecified chronic bronchitis: Secondary | ICD-10-CM | POA: Diagnosis not present

## 2018-04-18 DIAGNOSIS — S31125A Laceration of abdominal wall with foreign body, periumbilic region without penetration into peritoneal cavity, initial encounter: Secondary | ICD-10-CM | POA: Diagnosis not present

## 2018-04-23 ENCOUNTER — Encounter: Payer: Self-pay | Admitting: Hematology and Oncology

## 2018-04-23 DIAGNOSIS — R911 Solitary pulmonary nodule: Secondary | ICD-10-CM | POA: Insufficient documentation

## 2018-04-24 ENCOUNTER — Emergency Department
Admission: EM | Admit: 2018-04-24 | Discharge: 2018-04-25 | Disposition: A | Discharge status: Home / Self Care | Payer: Medicare HMO | Attending: Emergency Medicine | Admitting: Emergency Medicine

## 2018-04-24 ENCOUNTER — Encounter: Payer: Self-pay | Admitting: Emergency Medicine

## 2018-04-24 DIAGNOSIS — D72829 Elevated white blood cell count, unspecified: Secondary | ICD-10-CM | POA: Diagnosis not present

## 2018-04-24 DIAGNOSIS — Z7901 Long term (current) use of anticoagulants: Secondary | ICD-10-CM | POA: Diagnosis not present

## 2018-04-24 DIAGNOSIS — R41841 Cognitive communication deficit: Secondary | ICD-10-CM | POA: Diagnosis not present

## 2018-04-24 DIAGNOSIS — N898 Other specified noninflammatory disorders of vagina: Secondary | ICD-10-CM | POA: Diagnosis present

## 2018-04-24 DIAGNOSIS — Z79899 Other long term (current) drug therapy: Secondary | ICD-10-CM | POA: Diagnosis not present

## 2018-04-24 DIAGNOSIS — R82998 Other abnormal findings in urine: Secondary | ICD-10-CM | POA: Diagnosis not present

## 2018-04-24 DIAGNOSIS — R531 Weakness: Secondary | ICD-10-CM | POA: Diagnosis not present

## 2018-04-24 DIAGNOSIS — E119 Type 2 diabetes mellitus without complications: Secondary | ICD-10-CM | POA: Diagnosis not present

## 2018-04-24 DIAGNOSIS — E1151 Type 2 diabetes mellitus with diabetic peripheral angiopathy without gangrene: Secondary | ICD-10-CM | POA: Diagnosis not present

## 2018-04-24 DIAGNOSIS — I129 Hypertensive chronic kidney disease with stage 1 through stage 4 chronic kidney disease, or unspecified chronic kidney disease: Secondary | ICD-10-CM | POA: Diagnosis not present

## 2018-04-24 DIAGNOSIS — Z87891 Personal history of nicotine dependence: Secondary | ICD-10-CM | POA: Diagnosis not present

## 2018-04-24 DIAGNOSIS — F329 Major depressive disorder, single episode, unspecified: Secondary | ICD-10-CM | POA: Diagnosis not present

## 2018-04-24 DIAGNOSIS — G301 Alzheimer's disease with late onset: Secondary | ICD-10-CM | POA: Diagnosis not present

## 2018-04-24 DIAGNOSIS — N189 Chronic kidney disease, unspecified: Secondary | ICD-10-CM | POA: Diagnosis not present

## 2018-04-24 DIAGNOSIS — R239 Unspecified skin changes: Secondary | ICD-10-CM | POA: Insufficient documentation

## 2018-04-24 DIAGNOSIS — R238 Other skin changes: Secondary | ICD-10-CM

## 2018-04-24 DIAGNOSIS — L89322 Pressure ulcer of left buttock, stage 2: Secondary | ICD-10-CM | POA: Diagnosis not present

## 2018-04-24 DIAGNOSIS — F0280 Dementia in other diseases classified elsewhere without behavioral disturbance: Secondary | ICD-10-CM | POA: Diagnosis not present

## 2018-04-24 DIAGNOSIS — L989 Disorder of the skin and subcutaneous tissue, unspecified: Secondary | ICD-10-CM | POA: Diagnosis not present

## 2018-04-24 DIAGNOSIS — N184 Chronic kidney disease, stage 4 (severe): Secondary | ICD-10-CM | POA: Diagnosis not present

## 2018-04-24 LAB — URINALYSIS, COMPLETE (UACMP) WITH MICROSCOPIC
BILIRUBIN URINE: NEGATIVE
Bacteria, UA: NONE SEEN
Glucose, UA: NEGATIVE mg/dL
Hgb urine dipstick: NEGATIVE
Ketones, ur: NEGATIVE mg/dL
NITRITE: NEGATIVE
PH: 6 (ref 5.0–8.0)
Protein, ur: NEGATIVE mg/dL
SPECIFIC GRAVITY, URINE: 1.012 (ref 1.005–1.030)

## 2018-04-24 MED ORDER — ZINC OXIDE 16 % EX OINT: TOPICAL_OINTMENT | CUTANEOUS | 0 refills | Status: DC

## 2018-04-24 NOTE — ED Provider Notes (Signed)
[facility] Emergency Department Provider Note  ____________________________________________  Time seen: Approximately 10:54 PM  I have reviewed the triage vital signs and the nursing notes.   HISTORY  Chief Complaint vaginal irritation    HPI Amy [name] is a 82 y.o. female that presents emergency department for evaluation of rash to labia for 3 days.  Son was also told that there was a spot of blood when patient was being cleaned up.  Son states that he was told that she has some raw skin to her labia.  The home is not able to apply any cream without a prescription.  Patient has been behaving as herself.  Son states there are no concerns in addition to the rash.  There is no report of blood in stool.  Past Medical History:  Diagnosis Date  . Anemia    low iron  . Anxiety   . Arthritis   . Bilateral cataracts   . Bronchitis    hx of   . Cataract   . Chronic kidney disease   . Depression   . Diabetes mellitus without complication ([place])   . 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 GERD (gastroesophageal reflux disease)   . Headache   . History of frequent urinary tract infections   . Hx of renal cell cancer    LEFT  . Hyperlipidemia   . Hypertension   . Numbness and tingling    right hand   . Obesity   . Osteopenia 10/05/2017  . Peripheral artery disease ([place])   . Urinary frequency   . Urinary incontinence     Patient Active Problem List   Diagnosis Date Noted  . Lung nodule 04/23/2018  . Hypertensive renal disease 11/08/2017  . Secondary hyperparathyroidism of renal origin ([place]) 11/08/2017  . QT prolongation 10/27/2017  . Osteopenia 10/05/2017  . Choledocholithiasis 08/13/2017  . Chronic pain syndrome 04/05/2017  . Depression 11/15/2016  . Iron deficiency anemia 09/08/2016  . Anemia 06/29/2016  . Late onset Alzheimer's disease with behavioral disturbance ([place]) 06/14/2016  . S/P AKA  (above knee amputation) unilateral, left ([place]) 06/08/2016  . Wheelchair dependent 03/11/2016  . 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 ([place]) 03/13/2015  . S/p nephrectomy 01/29/2015  . Renal neoplasm 01/15/2015  . Abnormal EKG 01/02/2015  . Renal mass 12/05/2014  . GERD (gastroesophageal reflux disease) 11/20/2014  . GAD (generalized anxiety disorder) 11/20/2014  . AB (asthmatic bronchitis) 11/20/2014  . Aortic atherosclerosis ([place]) 11/20/2014  . Cataract 11/20/2014  . Leg pain 11/20/2014  . Continuous opioid dependence ([place]) 11/20/2014  . CKD (chronic kidney disease) stage 4, GFR 15-29 ml/min (HCC) 11/20/2014  . COPD, mild ([place]) 11/20/2014  . Excessive falling 11/20/2014  . Fatty infiltration of liver 11/20/2014  . Hyperlipidemia 11/20/2014  . Anterior knee pain 11/20/2014  . LBP (low back pain) 11/20/2014  . Malaise and fatigue 11/20/2014  . Abnormal presence of protein in urine 11/20/2014  . PVD (peripheral vascular disease) ([place]) 11/20/2014  . Allergic rhinitis 11/20/2014  . At risk for falling 11/20/2014  . Phantom limb syndrome with pain ([place]) 11/05/2014  . Anxiety 11/05/2014  . Elevated serum creatinine 11/05/2014  . Diabetes mellitus type 2, controlled ([place]) 11/05/2014  . H/O adenomatous polyp of colon 07/27/2010    Past Surgical History:  Procedure Laterality Date  . ABDOMINAL HYSTERECTOMY  1978  . CARPAL TUNNEL RELEASE Bilateral   .  CHOLECYSTECTOMY    . COLONOSCOPY    . COLONOSCOPY WITH ESOPHAGOGASTRODUODENOSCOPY (EGD)    . ERCP N/A 08/19/2017   Procedure: ENDOSCOPIC RETROGRADE CHOLANGIOPANCREATOGRAPHY (ERCP);  Surgeon: Arta Silence, MD;  Location: [facility] ENDOSCOPY;  Service: Endoscopy;  Laterality: N/A;  . LEG AMPUTATION     above the knee / left   . ROBOT ASSISTED LAPAROSCOPIC NEPHRECTOMY Left 01/15/2015   Procedure: ROBOTIC ASSISTED LAPAROSCOPIC RADICAL NEPHRECTOMY;  Surgeon: Alexis Frock, MD;  Location: [facility] ORS;  Service: Urology;  Laterality: Left;   . stent placement right leg       Prior to Admission medications   Medication Sig Start Date End Date Taking? Authorizing Provider  acetaminophen (TYLENOL) 500 MG tablet Take 1,000 mg by mouth every 6 (six) hours as needed for mild pain, fever or headache.     [provider]  albuterol (PROVENTIL HFA;VENTOLIN HFA) 108 (90 Base) MCG/ACT inhaler Inhale 2 puffs into the lungs every 6 (six) hours as needed for wheezing or shortness of breath. 06/15/17   Roselee Nova, MD  Ascorbic Acid (VITAMIN C) 500 MG CHEW One by mouth daily for 2-3 weeks 02/03/18   Arnetha Courser, MD  atorvastatin (LIPITOR) 20 MG tablet TAKE 1 TABLET (20 MG TOTAL) BY MOUTH AT BEDTIME. 04/14/18   Lada, Satira Anis, MD  busPIRone (BUSPAR) 10 MG tablet Take 10 mg by mouth 3 (three) times daily.    [provider]  clopidogrel (PLAVIX) 75 MG tablet Take 1 tablet (75 mg total) by mouth daily. 07/28/17   Algernon Huxley, MD  clotrimazole (LOTRIMIN) 1 % cream Apply 1 application topically 2 (two) times daily. 04/25/18   Laban Emperor, PA-C  donepezil (ARICEPT) 10 MG tablet Take 1 tablet (10 mg total) by mouth at bedtime. 10/06/17   Arnetha Courser, MD  famotidine (PEPCID) 20 MG tablet Take 1 tablet (20 mg total) by mouth at bedtime. To help prevent heartburn, reflux 04/05/18   Lada, Satira Anis, MD  furosemide (LASIX) 20 MG tablet Take 40 mg by mouth daily.  09/02/17   [provider]  gabapentin (NEURONTIN) 300 MG capsule Take 1 capsule (300 mg total) by mouth 3 (three) times daily. 03/28/18   Gillis Santa, MD  HYDROcodone-acetaminophen (NORCO) 7.5-325 MG tablet Take 1 tablet by mouth every 8 (eight) hours as needed for moderate pain or severe pain. To fill on or after: 04/14/2018, 05/13/2018, 06/12/2017 03/28/18 04/27/18  Gillis Santa, MD  ipratropium-albuterol (DUONEB) 0.5-2.5 (3) MG/3ML SOLN INHALE THE CONTENTS OF 1 VIAL VIA NEBULIZER THREE TIMES DAILY AS DIRECTED 11/10/17   Laverle Hobby, MD  metoprolol  tartrate (LOPRESSOR) 50 MG tablet Take 1 tablet (50 mg total) by mouth 2 (two) times daily. 06/15/17   Roselee Nova, MD  pantoprazole (PROTONIX) 40 MG tablet TAKE 1 TABLET EVERY DAY 04/14/18   Lada, Satira Anis, MD  QUEtiapine (SEROQUEL) 50 MG tablet Take 50 mg by mouth at bedtime. 10/27/17   [provider]  Zinc 50 MG TABS One by mouth daily for 2-3 weeks 02/03/18   Arnetha Courser, MD  Zinc Oxide (BOUDREAUXS BUTT PASTE) 16 % OINT Apply paste after urination or bowel movements 04/24/18   Laban Emperor, PA-C    Allergies Fentanyl and Penicillins  Family History  Problem Relation Age of Onset  . Cancer Mother        Stomach  . Cancer Father   . Diabetes Brother   . Cancer Brother  oral  . Healthy Son   . Healthy Sister     Social History Social History   Tobacco Use  . Smoking status: Former Smoker    Packs/day: 1.50    Years: 51.00    Pack years: 76.50    Types: Cigarettes    Last attempt to quit: 05/24/2004    Years since quitting: 13.9  . Smokeless tobacco: Never Used  . Tobacco comment: smoking cessation materials not required  Substance Use Topics  . Alcohol use: Not Currently  . Drug use: No     Review of Systems  Constitutional: No fever/chills Cardiovascular: No chest pain. Respiratory: No SOB. Gastrointestinal: No abdominal pain.  No nausea, no vomiting.  Musculoskeletal: Negative for musculoskeletal pain. Skin: Negative for abrasions, lacerations, ecchymosis. Positive for rash.    ____________________________________________   PHYSICAL EXAM:  VITAL SIGNS: ED Triage Vitals  Enc Vitals Group     BP 04/24/18 2231 (!) 117/53     Pulse Rate 04/24/18 2231 89     Resp 04/24/18 2231 20     Temp 04/24/18 2231 97.8 F (36.6 C)     Temp Source 04/24/18 2231 Oral     SpO2 04/24/18 2231 98 %     Weight 04/24/18 1951 170 lb (77.1 kg)     Height 04/24/18 1951 5\' 3"  (1.6 m)     Head Circumference --      Peak Flow --      Pain Score 04/24/18  1951 0     Pain Loc --      Pain Edu? --      Excl. in [place]? --      Constitutional: Well appearing and in no acute distress. Eyes: Conjunctivae are normal. PERRL. EOMI. Head: Atraumatic. ENT:      Ears:      Nose: No congestion/rhinnorhea.      Mouth/Throat: Mucous membranes are moist.  Neck: No stridor.  Cardiovascular: Normal rate, regular rhythm.  Good peripheral circulation. Respiratory: Normal respiratory effort without tachypnea or retractions. Lungs CTAB. Good air entry to the bases with no decreased or absent breath sounds. Musculoskeletal: Full range of motion to all extremities. No gross deformities appreciated. Genitourinary: [place] tech present for exam.  Mild erythema to bilateral labia with few 1/2 cm circular spots of raw skin to posterior labia majora. Stool on skin.  Neurologic:  Normal speech and language. No gross focal neurologic deficits are appreciated.  Skin:  Skin is warm, dry and intact. No rash noted.   ____________________________________________   LABS (all labs ordered are listed, but only abnormal results are displayed)  Labs Reviewed  URINALYSIS, COMPLETE (UACMP) WITH MICROSCOPIC - Abnormal; Notable for the following components:      Result Value   Color, Urine YELLOW (*)    APPearance CLOUDY (*)    Leukocytes, UA LARGE (*)    WBC, UA >50 (*)    Non Squamous Epithelial PRESENT (*)    All other components within normal limits  URINE CULTURE   ____________________________________________  EKG   ____________________________________________  RADIOLOGY   No results found.  ____________________________________________    PROCEDURES  Procedure(s) performed:    Procedures    Medications - No data to display   ____________________________________________   INITIAL IMPRESSION / ASSESSMENT AND PLAN / ED COURSE  Pertinent labs & imaging results that were available during my care of the patient were reviewed by me and  considered in my medical decision making (see chart for details).  Review  of the [place] CSRS was performed in accordance of the [place] prior to dispensing any controlled drugs.   Patient presented to the emergency department for evaluation of rash to labia for 3 days.  Labia is mildly irritated with some early skin breakdown.  Irritation and skin breakdown is likely due to urine and stool. Blood likely due to area skin breakdown.  No blood on urinalysis.  No report of blood in stool.  Urinalysis shows leukocytes and no bacteria.  Leukocytes may be contamination.  Urine will be sent for culture.  Son prefers to call the emergency department in 24 hours for culture results before beginning antibiotics.  No fever.  Patient will be discharged home with prescriptions for bordeaux butt paste and clotrimazole. Patient is to follow up with primary care as directed. Patient is given ED precautions to return to the ED for any worsening or new symptoms.     ____________________________________________  FINAL CLINICAL IMPRESSION(S) / ED DIAGNOSES  Final diagnoses:  Leukocytes in urine  Skin irritation      NEW MEDICATIONS STARTED DURING THIS VISIT:  ED Discharge Orders         Ordered    clotrimazole (LOTRIMIN) 1 % cream  2 times daily     04/25/18 0015    Zinc Oxide (BOUDREAUXS BUTT PASTE) 16 % OINT     04/24/18 2334              This chart was dictated using voice recognition software/Dragon. Despite best efforts to proofread, errors can occur which can change the meaning. Any change was purely unintentional.    Laban Emperor, PA-C 04/25/18 [place], [place], MD 04/25/18 [phone]

## 2018-04-24 NOTE — ED Triage Notes (Signed)
Pt sent from assisted living for evaluation of a diaper rash type irritation per son.  Labia has been red and swollen. Son reports they have not been changing her often enough.  Son reports cannot even use desitin without permission or prescription.  He brought her for evaluation and a prescription. No problems prior to going into [facility]. No bleeding that son knew of despite sheet saying that.  He would like something sent home reporting more frequent changing or maybe open to air for a short time so that she can heal.

## 2018-04-24 NOTE — Discharge Instructions (Addendum)
Apply cream to skin after urination and bowel movements.  Please call the hospital in 24 hours for your urine culture results.  Return to the emergency department immediately for any fever, change in behavior, vomiting, abdominal pain.,  Change in symptoms.

## 2018-04-25 DIAGNOSIS — R41841 Cognitive communication deficit: Secondary | ICD-10-CM | POA: Diagnosis not present

## 2018-04-25 DIAGNOSIS — N184 Chronic kidney disease, stage 4 (severe): Secondary | ICD-10-CM | POA: Diagnosis not present

## 2018-04-25 DIAGNOSIS — R531 Weakness: Secondary | ICD-10-CM | POA: Diagnosis not present

## 2018-04-25 DIAGNOSIS — F0280 Dementia in other diseases classified elsewhere without behavioral disturbance: Secondary | ICD-10-CM | POA: Diagnosis not present

## 2018-04-25 DIAGNOSIS — F329 Major depressive disorder, single episode, unspecified: Secondary | ICD-10-CM | POA: Diagnosis not present

## 2018-04-25 DIAGNOSIS — E1151 Type 2 diabetes mellitus with diabetic peripheral angiopathy without gangrene: Secondary | ICD-10-CM | POA: Diagnosis not present

## 2018-04-25 DIAGNOSIS — L89322 Pressure ulcer of left buttock, stage 2: Secondary | ICD-10-CM | POA: Diagnosis not present

## 2018-04-25 DIAGNOSIS — I129 Hypertensive chronic kidney disease with stage 1 through stage 4 chronic kidney disease, or unspecified chronic kidney disease: Secondary | ICD-10-CM | POA: Diagnosis not present

## 2018-04-25 DIAGNOSIS — G301 Alzheimer's disease with late onset: Secondary | ICD-10-CM | POA: Diagnosis not present

## 2018-04-25 MED ORDER — ZINC OXIDE 13 % EX CREA: 1.0000 "application " | TOPICAL_CREAM | Freq: Three times a day (TID) | CUTANEOUS | 0 refills | Status: DC | PRN

## 2018-04-25 MED ORDER — CEPHALEXIN 500 MG PO CAPS: 500.0000 mg | ORAL_CAPSULE | Freq: Two times a day (BID) | ORAL | 0 refills | Status: DC

## 2018-04-25 MED ORDER — CLOTRIMAZOLE 1 % EX CREA: 1.0000 "application " | TOPICAL_CREAM | Freq: Two times a day (BID) | CUTANEOUS | 0 refills | Status: DC

## 2018-04-25 MED ORDER — CEPHALEXIN 500 MG PO CAPS: 500.0000 mg | ORAL_CAPSULE | Freq: Two times a day (BID) | ORAL | 0 refills | Status: AC

## 2018-04-25 NOTE — ED Notes (Signed)
Pt taken to POV in wheelchair. VSS. NAD. Discharge instructions, RX and follow up reviewed. All questions and concerns addressed.

## 2018-04-26 ENCOUNTER — Telehealth: Payer: Self-pay | Admitting: Family Medicine

## 2018-04-26 NOTE — Telephone Encounter (Signed)
Copied from [place] [phone]. Topic: Quick Communication - Home Health Verbal Orders >> Apr 26, 2018 10:50 AM Bea Graff, NT wrote: Caller/Agency: [facility] Callback Number: [phone] Requesting OT/PT/Skilled Nursing/Social Work: Nursing Frequency: For cognitive communication and impairment. 2 time a week for 2 weeks

## 2018-04-26 NOTE — Telephone Encounter (Signed)
Verbal orders given per Dr. Sanda Klein.

## 2018-04-26 NOTE — Telephone Encounter (Signed)
Okay 

## 2018-04-27 ENCOUNTER — Telehealth: Payer: Self-pay | Admitting: Family Medicine

## 2018-04-27 DIAGNOSIS — I129 Hypertensive chronic kidney disease with stage 1 through stage 4 chronic kidney disease, or unspecified chronic kidney disease: Secondary | ICD-10-CM | POA: Diagnosis not present

## 2018-04-27 DIAGNOSIS — R531 Weakness: Secondary | ICD-10-CM | POA: Diagnosis not present

## 2018-04-27 DIAGNOSIS — E1151 Type 2 diabetes mellitus with diabetic peripheral angiopathy without gangrene: Secondary | ICD-10-CM | POA: Diagnosis not present

## 2018-04-27 DIAGNOSIS — N184 Chronic kidney disease, stage 4 (severe): Secondary | ICD-10-CM | POA: Diagnosis not present

## 2018-04-27 DIAGNOSIS — G301 Alzheimer's disease with late onset: Secondary | ICD-10-CM | POA: Diagnosis not present

## 2018-04-27 DIAGNOSIS — L89322 Pressure ulcer of left buttock, stage 2: Secondary | ICD-10-CM | POA: Diagnosis not present

## 2018-04-27 DIAGNOSIS — R41841 Cognitive communication deficit: Secondary | ICD-10-CM | POA: Diagnosis not present

## 2018-04-27 DIAGNOSIS — F329 Major depressive disorder, single episode, unspecified: Secondary | ICD-10-CM | POA: Diagnosis not present

## 2018-04-27 DIAGNOSIS — F0280 Dementia in other diseases classified elsewhere without behavioral disturbance: Secondary | ICD-10-CM | POA: Diagnosis not present

## 2018-04-27 NOTE — Telephone Encounter (Signed)
Copied from [place] [phone]. Topic: Quick Communication - Home Health Verbal Orders >> Apr 27, 2018  3:07 PM Carolyn Stare wrote: Amy [name] with [facility]  Callback Number   [phone]   [id number] OT req verbal   Frequency  1 x 1     2 x 5

## 2018-04-27 NOTE — Telephone Encounter (Signed)
Yes, that's fine. Thank you! 

## 2018-04-28 DIAGNOSIS — F329 Major depressive disorder, single episode, unspecified: Secondary | ICD-10-CM | POA: Diagnosis not present

## 2018-04-28 DIAGNOSIS — G301 Alzheimer's disease with late onset: Secondary | ICD-10-CM | POA: Diagnosis not present

## 2018-04-28 DIAGNOSIS — N184 Chronic kidney disease, stage 4 (severe): Secondary | ICD-10-CM | POA: Diagnosis not present

## 2018-04-28 DIAGNOSIS — R41841 Cognitive communication deficit: Secondary | ICD-10-CM | POA: Diagnosis not present

## 2018-04-28 DIAGNOSIS — L89322 Pressure ulcer of left buttock, stage 2: Secondary | ICD-10-CM | POA: Diagnosis not present

## 2018-04-28 DIAGNOSIS — R531 Weakness: Secondary | ICD-10-CM | POA: Diagnosis not present

## 2018-04-28 DIAGNOSIS — E1151 Type 2 diabetes mellitus with diabetic peripheral angiopathy without gangrene: Secondary | ICD-10-CM | POA: Diagnosis not present

## 2018-04-28 DIAGNOSIS — F0280 Dementia in other diseases classified elsewhere without behavioral disturbance: Secondary | ICD-10-CM | POA: Diagnosis not present

## 2018-04-28 DIAGNOSIS — I129 Hypertensive chronic kidney disease with stage 1 through stage 4 chronic kidney disease, or unspecified chronic kidney disease: Secondary | ICD-10-CM | POA: Diagnosis not present

## 2018-04-28 LAB — URINE CULTURE: Culture: >=100000 — AB

## 2018-04-28 NOTE — Telephone Encounter (Signed)
Verbal okay given.  

## 2018-04-28 NOTE — Progress Notes (Addendum)
ED Antimicrobial Stewardship Positive Culture Follow Up   Amy [name] is an 82 y.o. female who presented to [facility] on 04/24/2018 with a chief complaint of  Chief Complaint  Patient presents with  . vaginal irritation    Recent Results (from the past 720 hour(s))  Urine Culture     Status: Abnormal   Collection Time: 04/24/18 12:13 AM  Result Value Ref Range Status   Specimen Description   Final    URINE, RANDOM Performed at [facility], [address], [place], [place] [address]    Special Requests   Final    NONE Performed at [facility], [address] [place], [place] [address]    Culture (A)  Final    >=100,000 COLONIES/mL MORGANELLA MORGANII 50,000 COLONIES/mL ESCHERICHIA COLI    Report Status 04/28/2018 FINAL  Final   Organism ID, Bacteria MORGANELLA MORGANII (A)  Final   Organism ID, Bacteria ESCHERICHIA COLI (A)  Final      Susceptibility   Escherichia coli - MIC*    AMPICILLIN >=32 RESISTANT Resistant     CEFAZOLIN <=4 SENSITIVE Sensitive     CEFTRIAXONE <=1 SENSITIVE Sensitive     CIPROFLOXACIN <=0.25 SENSITIVE Sensitive     GENTAMICIN <=1 SENSITIVE Sensitive     IMIPENEM <=0.25 SENSITIVE Sensitive     NITROFURANTOIN 64 INTERMEDIATE Intermediate     TRIMETH/SULFA <=20 SENSITIVE Sensitive     AMPICILLIN/SULBACTAM 16 INTERMEDIATE Intermediate     PIP/TAZO <=4 SENSITIVE Sensitive     Extended ESBL NEGATIVE Sensitive     * 50,000 COLONIES/mL ESCHERICHIA COLI   Morganella morganii - MIC*    AMPICILLIN >=32 RESISTANT Resistant     CEFAZOLIN >=64 RESISTANT Resistant     CEFTRIAXONE <=1 SENSITIVE Sensitive     CIPROFLOXACIN <=0.25 SENSITIVE Sensitive     GENTAMICIN <=1 SENSITIVE Sensitive     IMIPENEM 4 SENSITIVE Sensitive     NITROFURANTOIN 128 RESISTANT Resistant     TRIMETH/SULFA <=20 SENSITIVE Sensitive     AMPICILLIN/SULBACTAM >=32 RESISTANT Resistant     PIP/TAZO <=4 SENSITIVE Sensitive     * >=100,000 COLONIES/mL  MORGANELLA MORGANII    [x]  Treated with Cephalexin, organism resistant to prescribed antimicrobial   New antibiotic prescription: Ciprofloxacin 500mg  q 24 for 5 days  Called into [facility] - spoke to [place], [place] - pt aware  ED Provider: Benjiman Core, PharmD, BCPS Clinical Pharmacist 04/28/2018 4:45 PM  Monday - Friday phone -  [phone] Saturday - Sunday phone - [phone]

## 2018-05-02 DIAGNOSIS — E1151 Type 2 diabetes mellitus with diabetic peripheral angiopathy without gangrene: Secondary | ICD-10-CM | POA: Diagnosis not present

## 2018-05-02 DIAGNOSIS — R531 Weakness: Secondary | ICD-10-CM | POA: Diagnosis not present

## 2018-05-02 DIAGNOSIS — F0280 Dementia in other diseases classified elsewhere without behavioral disturbance: Secondary | ICD-10-CM | POA: Diagnosis not present

## 2018-05-02 DIAGNOSIS — R41841 Cognitive communication deficit: Secondary | ICD-10-CM | POA: Diagnosis not present

## 2018-05-02 DIAGNOSIS — I129 Hypertensive chronic kidney disease with stage 1 through stage 4 chronic kidney disease, or unspecified chronic kidney disease: Secondary | ICD-10-CM | POA: Diagnosis not present

## 2018-05-02 DIAGNOSIS — F329 Major depressive disorder, single episode, unspecified: Secondary | ICD-10-CM | POA: Diagnosis not present

## 2018-05-02 DIAGNOSIS — N184 Chronic kidney disease, stage 4 (severe): Secondary | ICD-10-CM | POA: Diagnosis not present

## 2018-05-02 DIAGNOSIS — G301 Alzheimer's disease with late onset: Secondary | ICD-10-CM | POA: Diagnosis not present

## 2018-05-02 DIAGNOSIS — L89322 Pressure ulcer of left buttock, stage 2: Secondary | ICD-10-CM | POA: Diagnosis not present

## 2018-05-03 DIAGNOSIS — F0280 Dementia in other diseases classified elsewhere without behavioral disturbance: Secondary | ICD-10-CM | POA: Diagnosis not present

## 2018-05-03 DIAGNOSIS — I129 Hypertensive chronic kidney disease with stage 1 through stage 4 chronic kidney disease, or unspecified chronic kidney disease: Secondary | ICD-10-CM | POA: Diagnosis not present

## 2018-05-03 DIAGNOSIS — L89322 Pressure ulcer of left buttock, stage 2: Secondary | ICD-10-CM | POA: Diagnosis not present

## 2018-05-03 DIAGNOSIS — R41841 Cognitive communication deficit: Secondary | ICD-10-CM | POA: Diagnosis not present

## 2018-05-03 DIAGNOSIS — R531 Weakness: Secondary | ICD-10-CM | POA: Diagnosis not present

## 2018-05-03 DIAGNOSIS — N184 Chronic kidney disease, stage 4 (severe): Secondary | ICD-10-CM | POA: Diagnosis not present

## 2018-05-03 DIAGNOSIS — F329 Major depressive disorder, single episode, unspecified: Secondary | ICD-10-CM | POA: Diagnosis not present

## 2018-05-03 DIAGNOSIS — E1151 Type 2 diabetes mellitus with diabetic peripheral angiopathy without gangrene: Secondary | ICD-10-CM | POA: Diagnosis not present

## 2018-05-03 DIAGNOSIS — G301 Alzheimer's disease with late onset: Secondary | ICD-10-CM | POA: Diagnosis not present

## 2018-05-04 ENCOUNTER — Telehealth: Payer: Self-pay | Admitting: Family Medicine

## 2018-05-04 DIAGNOSIS — F329 Major depressive disorder, single episode, unspecified: Secondary | ICD-10-CM | POA: Diagnosis not present

## 2018-05-04 DIAGNOSIS — R531 Weakness: Secondary | ICD-10-CM | POA: Diagnosis not present

## 2018-05-04 DIAGNOSIS — I129 Hypertensive chronic kidney disease with stage 1 through stage 4 chronic kidney disease, or unspecified chronic kidney disease: Secondary | ICD-10-CM | POA: Diagnosis not present

## 2018-05-04 DIAGNOSIS — E1151 Type 2 diabetes mellitus with diabetic peripheral angiopathy without gangrene: Secondary | ICD-10-CM | POA: Diagnosis not present

## 2018-05-04 DIAGNOSIS — G301 Alzheimer's disease with late onset: Secondary | ICD-10-CM | POA: Diagnosis not present

## 2018-05-04 DIAGNOSIS — L89322 Pressure ulcer of left buttock, stage 2: Secondary | ICD-10-CM | POA: Diagnosis not present

## 2018-05-04 DIAGNOSIS — R197 Diarrhea, unspecified: Secondary | ICD-10-CM | POA: Diagnosis not present

## 2018-05-04 DIAGNOSIS — F0280 Dementia in other diseases classified elsewhere without behavioral disturbance: Secondary | ICD-10-CM | POA: Diagnosis not present

## 2018-05-04 DIAGNOSIS — R41841 Cognitive communication deficit: Secondary | ICD-10-CM | POA: Diagnosis not present

## 2018-05-04 DIAGNOSIS — N184 Chronic kidney disease, stage 4 (severe): Secondary | ICD-10-CM | POA: Diagnosis not present

## 2018-05-04 NOTE — Telephone Encounter (Signed)
Copied from [place]. Topic: Quick Communication - Home Health Verbal Orders >> May 04, 2018  9:12 AM Margot Ables wrote: Caller/Agency: Vallery Ridge with [facility] Callback Number: [phone], secure VM if not available Reason for call: pt having severe diarrhea several times a day - she has bad rash on perineum and sacral area - they are applying Desitin currently - can something be called in for the diarrhea or possibly change abx from keflex if needed.  Fax for [place] if something sent in fax# [phone]

## 2018-05-04 NOTE — Telephone Encounter (Signed)
Left very detailed voicemail for Nurse Benjamine Mola

## 2018-05-04 NOTE — Telephone Encounter (Signed)
She needs stat testing for C diff; have her go to urgent care or the ER Keflex can precipitate C diff which can be very dangerous is elderly people

## 2018-05-05 DIAGNOSIS — L89322 Pressure ulcer of left buttock, stage 2: Secondary | ICD-10-CM | POA: Diagnosis not present

## 2018-05-05 DIAGNOSIS — R41841 Cognitive communication deficit: Secondary | ICD-10-CM | POA: Diagnosis not present

## 2018-05-05 DIAGNOSIS — G301 Alzheimer's disease with late onset: Secondary | ICD-10-CM | POA: Diagnosis not present

## 2018-05-05 DIAGNOSIS — N184 Chronic kidney disease, stage 4 (severe): Secondary | ICD-10-CM | POA: Diagnosis not present

## 2018-05-05 DIAGNOSIS — R531 Weakness: Secondary | ICD-10-CM | POA: Diagnosis not present

## 2018-05-05 DIAGNOSIS — I129 Hypertensive chronic kidney disease with stage 1 through stage 4 chronic kidney disease, or unspecified chronic kidney disease: Secondary | ICD-10-CM | POA: Diagnosis not present

## 2018-05-05 DIAGNOSIS — F329 Major depressive disorder, single episode, unspecified: Secondary | ICD-10-CM | POA: Diagnosis not present

## 2018-05-05 DIAGNOSIS — E1151 Type 2 diabetes mellitus with diabetic peripheral angiopathy without gangrene: Secondary | ICD-10-CM | POA: Diagnosis not present

## 2018-05-05 DIAGNOSIS — F0280 Dementia in other diseases classified elsewhere without behavioral disturbance: Secondary | ICD-10-CM | POA: Diagnosis not present

## 2018-05-08 DIAGNOSIS — F0280 Dementia in other diseases classified elsewhere without behavioral disturbance: Secondary | ICD-10-CM | POA: Diagnosis not present

## 2018-05-08 DIAGNOSIS — L89322 Pressure ulcer of left buttock, stage 2: Secondary | ICD-10-CM | POA: Diagnosis not present

## 2018-05-08 DIAGNOSIS — G301 Alzheimer's disease with late onset: Secondary | ICD-10-CM | POA: Diagnosis not present

## 2018-05-08 DIAGNOSIS — R41841 Cognitive communication deficit: Secondary | ICD-10-CM | POA: Diagnosis not present

## 2018-05-08 DIAGNOSIS — I129 Hypertensive chronic kidney disease with stage 1 through stage 4 chronic kidney disease, or unspecified chronic kidney disease: Secondary | ICD-10-CM | POA: Diagnosis not present

## 2018-05-08 DIAGNOSIS — R531 Weakness: Secondary | ICD-10-CM | POA: Diagnosis not present

## 2018-05-08 DIAGNOSIS — F329 Major depressive disorder, single episode, unspecified: Secondary | ICD-10-CM | POA: Diagnosis not present

## 2018-05-08 DIAGNOSIS — E1151 Type 2 diabetes mellitus with diabetic peripheral angiopathy without gangrene: Secondary | ICD-10-CM | POA: Diagnosis not present

## 2018-05-08 DIAGNOSIS — R197 Diarrhea, unspecified: Secondary | ICD-10-CM | POA: Diagnosis not present

## 2018-05-08 DIAGNOSIS — N184 Chronic kidney disease, stage 4 (severe): Secondary | ICD-10-CM | POA: Diagnosis not present

## 2018-05-09 DIAGNOSIS — L89322 Pressure ulcer of left buttock, stage 2: Secondary | ICD-10-CM | POA: Diagnosis not present

## 2018-05-09 DIAGNOSIS — S31125A Laceration of abdominal wall with foreign body, periumbilic region without penetration into peritoneal cavity, initial encounter: Secondary | ICD-10-CM | POA: Diagnosis not present

## 2018-05-09 DIAGNOSIS — F329 Major depressive disorder, single episode, unspecified: Secondary | ICD-10-CM | POA: Diagnosis not present

## 2018-05-09 DIAGNOSIS — I129 Hypertensive chronic kidney disease with stage 1 through stage 4 chronic kidney disease, or unspecified chronic kidney disease: Secondary | ICD-10-CM | POA: Diagnosis not present

## 2018-05-09 DIAGNOSIS — G301 Alzheimer's disease with late onset: Secondary | ICD-10-CM | POA: Diagnosis not present

## 2018-05-09 DIAGNOSIS — R41841 Cognitive communication deficit: Secondary | ICD-10-CM | POA: Diagnosis not present

## 2018-05-09 DIAGNOSIS — R531 Weakness: Secondary | ICD-10-CM | POA: Diagnosis not present

## 2018-05-09 DIAGNOSIS — Z89612 Acquired absence of left leg above knee: Secondary | ICD-10-CM | POA: Diagnosis not present

## 2018-05-09 DIAGNOSIS — F0280 Dementia in other diseases classified elsewhere without behavioral disturbance: Secondary | ICD-10-CM | POA: Diagnosis not present

## 2018-05-09 DIAGNOSIS — E1151 Type 2 diabetes mellitus with diabetic peripheral angiopathy without gangrene: Secondary | ICD-10-CM | POA: Diagnosis not present

## 2018-05-09 DIAGNOSIS — N184 Chronic kidney disease, stage 4 (severe): Secondary | ICD-10-CM | POA: Diagnosis not present

## 2018-05-09 DIAGNOSIS — J42 Unspecified chronic bronchitis: Secondary | ICD-10-CM | POA: Diagnosis not present

## 2018-05-10 DIAGNOSIS — G301 Alzheimer's disease with late onset: Secondary | ICD-10-CM | POA: Diagnosis not present

## 2018-05-10 DIAGNOSIS — I129 Hypertensive chronic kidney disease with stage 1 through stage 4 chronic kidney disease, or unspecified chronic kidney disease: Secondary | ICD-10-CM | POA: Diagnosis not present

## 2018-05-10 DIAGNOSIS — N184 Chronic kidney disease, stage 4 (severe): Secondary | ICD-10-CM | POA: Diagnosis not present

## 2018-05-10 DIAGNOSIS — F329 Major depressive disorder, single episode, unspecified: Secondary | ICD-10-CM | POA: Diagnosis not present

## 2018-05-10 DIAGNOSIS — E1151 Type 2 diabetes mellitus with diabetic peripheral angiopathy without gangrene: Secondary | ICD-10-CM | POA: Diagnosis not present

## 2018-05-10 DIAGNOSIS — F0280 Dementia in other diseases classified elsewhere without behavioral disturbance: Secondary | ICD-10-CM | POA: Diagnosis not present

## 2018-05-10 DIAGNOSIS — R41841 Cognitive communication deficit: Secondary | ICD-10-CM | POA: Diagnosis not present

## 2018-05-10 DIAGNOSIS — L89322 Pressure ulcer of left buttock, stage 2: Secondary | ICD-10-CM | POA: Diagnosis not present

## 2018-05-10 DIAGNOSIS — R531 Weakness: Secondary | ICD-10-CM | POA: Diagnosis not present

## 2018-05-11 DIAGNOSIS — F329 Major depressive disorder, single episode, unspecified: Secondary | ICD-10-CM | POA: Diagnosis not present

## 2018-05-11 DIAGNOSIS — L89322 Pressure ulcer of left buttock, stage 2: Secondary | ICD-10-CM | POA: Diagnosis not present

## 2018-05-11 DIAGNOSIS — I129 Hypertensive chronic kidney disease with stage 1 through stage 4 chronic kidney disease, or unspecified chronic kidney disease: Secondary | ICD-10-CM | POA: Diagnosis not present

## 2018-05-11 DIAGNOSIS — R531 Weakness: Secondary | ICD-10-CM | POA: Diagnosis not present

## 2018-05-11 DIAGNOSIS — N184 Chronic kidney disease, stage 4 (severe): Secondary | ICD-10-CM | POA: Diagnosis not present

## 2018-05-11 DIAGNOSIS — G301 Alzheimer's disease with late onset: Secondary | ICD-10-CM | POA: Diagnosis not present

## 2018-05-11 DIAGNOSIS — E1151 Type 2 diabetes mellitus with diabetic peripheral angiopathy without gangrene: Secondary | ICD-10-CM | POA: Diagnosis not present

## 2018-05-11 DIAGNOSIS — R41841 Cognitive communication deficit: Secondary | ICD-10-CM | POA: Diagnosis not present

## 2018-05-11 DIAGNOSIS — F0280 Dementia in other diseases classified elsewhere without behavioral disturbance: Secondary | ICD-10-CM | POA: Diagnosis not present

## 2018-05-15 ENCOUNTER — Telehealth: Payer: Self-pay | Admitting: Family Medicine

## 2018-05-15 DIAGNOSIS — F329 Major depressive disorder, single episode, unspecified: Secondary | ICD-10-CM | POA: Diagnosis not present

## 2018-05-15 DIAGNOSIS — R531 Weakness: Secondary | ICD-10-CM | POA: Diagnosis not present

## 2018-05-15 DIAGNOSIS — R41841 Cognitive communication deficit: Secondary | ICD-10-CM | POA: Diagnosis not present

## 2018-05-15 DIAGNOSIS — N184 Chronic kidney disease, stage 4 (severe): Secondary | ICD-10-CM | POA: Diagnosis not present

## 2018-05-15 DIAGNOSIS — F0280 Dementia in other diseases classified elsewhere without behavioral disturbance: Secondary | ICD-10-CM | POA: Diagnosis not present

## 2018-05-15 DIAGNOSIS — G301 Alzheimer's disease with late onset: Secondary | ICD-10-CM | POA: Diagnosis not present

## 2018-05-15 DIAGNOSIS — E1151 Type 2 diabetes mellitus with diabetic peripheral angiopathy without gangrene: Secondary | ICD-10-CM | POA: Diagnosis not present

## 2018-05-15 DIAGNOSIS — I129 Hypertensive chronic kidney disease with stage 1 through stage 4 chronic kidney disease, or unspecified chronic kidney disease: Secondary | ICD-10-CM | POA: Diagnosis not present

## 2018-05-15 DIAGNOSIS — L89322 Pressure ulcer of left buttock, stage 2: Secondary | ICD-10-CM | POA: Diagnosis not present

## 2018-05-15 NOTE — Telephone Encounter (Signed)
That's fine Clarify with PT at the facility that they are asking for a [facility] lift or if another type, we'll need the name

## 2018-05-15 NOTE — Telephone Encounter (Signed)
Copied from [place] [id number]. Topic: Quick Communication - See Telephone Encounter >> May 15, 2018  8:44 AM Ivar Drape wrote: CRM for notification. See Telephone encounter for: 05/15/18. Patient is at [facility] and she will be leaving but they will need a prescription for a lift to lift her out of her wheelchair into a bed.  Husband, [name] will call back to tell where to send the prescription.

## 2018-05-16 DIAGNOSIS — E1151 Type 2 diabetes mellitus with diabetic peripheral angiopathy without gangrene: Secondary | ICD-10-CM | POA: Diagnosis not present

## 2018-05-16 DIAGNOSIS — R41841 Cognitive communication deficit: Secondary | ICD-10-CM | POA: Diagnosis not present

## 2018-05-16 DIAGNOSIS — L89322 Pressure ulcer of left buttock, stage 2: Secondary | ICD-10-CM | POA: Diagnosis not present

## 2018-05-16 DIAGNOSIS — F329 Major depressive disorder, single episode, unspecified: Secondary | ICD-10-CM | POA: Diagnosis not present

## 2018-05-16 DIAGNOSIS — N184 Chronic kidney disease, stage 4 (severe): Secondary | ICD-10-CM | POA: Diagnosis not present

## 2018-05-16 DIAGNOSIS — F0280 Dementia in other diseases classified elsewhere without behavioral disturbance: Secondary | ICD-10-CM | POA: Diagnosis not present

## 2018-05-16 DIAGNOSIS — I129 Hypertensive chronic kidney disease with stage 1 through stage 4 chronic kidney disease, or unspecified chronic kidney disease: Secondary | ICD-10-CM | POA: Diagnosis not present

## 2018-05-16 DIAGNOSIS — G301 Alzheimer's disease with late onset: Secondary | ICD-10-CM | POA: Diagnosis not present

## 2018-05-16 DIAGNOSIS — R531 Weakness: Secondary | ICD-10-CM | POA: Diagnosis not present

## 2018-05-22 MED ORDER — UNABLE TO FIND: 0 refills | Status: DC

## 2018-05-22 NOTE — Telephone Encounter (Signed)
Husband states he thinks his son has taken care of but will check and let us know

## 2018-05-22 NOTE — Telephone Encounter (Signed)
Patient's son, Nathaneil Canary, calling to check on the status of the [facility] lift being sent to [place]. Please advise.

## 2018-05-22 NOTE — Telephone Encounter (Signed)
Rx ready to send

## 2018-05-22 NOTE — Telephone Encounter (Signed)
Yes they need hoyer lift

## 2018-05-26 ENCOUNTER — Ambulatory Visit
Admission: RE | Admit: 2018-05-26 | Discharge: 2018-05-26 | Disposition: A | Discharge status: Home / Self Care | Payer: Medicare HMO | Source: Ambulatory Visit | Attending: Hematology and Oncology | Admitting: Hematology and Oncology

## 2018-05-26 ENCOUNTER — Encounter (INDEPENDENT_AMBULATORY_CARE_PROVIDER_SITE_OTHER): Payer: Self-pay

## 2018-05-26 DIAGNOSIS — R918 Other nonspecific abnormal finding of lung field: Secondary | ICD-10-CM | POA: Diagnosis not present

## 2018-05-26 DIAGNOSIS — R911 Solitary pulmonary nodule: Secondary | ICD-10-CM | POA: Insufficient documentation

## 2018-05-31 ENCOUNTER — Other Ambulatory Visit: Payer: Self-pay

## 2018-05-31 ENCOUNTER — Encounter: Payer: Self-pay | Admitting: Hematology and Oncology

## 2018-05-31 ENCOUNTER — Inpatient Hospital Stay: Payer: Medicare HMO

## 2018-05-31 ENCOUNTER — Inpatient Hospital Stay: Payer: Medicare HMO | Attending: Hematology and Oncology | Admitting: Hematology and Oncology

## 2018-05-31 VITALS — BP 127/76 | HR 69 | Temp 96.7°F | Resp 18

## 2018-05-31 DIAGNOSIS — R911 Solitary pulmonary nodule: Secondary | ICD-10-CM

## 2018-05-31 DIAGNOSIS — Z86718 Personal history of other venous thrombosis and embolism: Secondary | ICD-10-CM | POA: Diagnosis not present

## 2018-05-31 DIAGNOSIS — N189 Chronic kidney disease, unspecified: Secondary | ICD-10-CM | POA: Diagnosis not present

## 2018-05-31 DIAGNOSIS — Z87891 Personal history of nicotine dependence: Secondary | ICD-10-CM | POA: Diagnosis not present

## 2018-05-31 DIAGNOSIS — D509 Iron deficiency anemia, unspecified: Secondary | ICD-10-CM | POA: Diagnosis not present

## 2018-05-31 DIAGNOSIS — D49519 Neoplasm of unspecified behavior of unspecified kidney: Secondary | ICD-10-CM

## 2018-05-31 DIAGNOSIS — Z79899 Other long term (current) drug therapy: Secondary | ICD-10-CM

## 2018-05-31 DIAGNOSIS — M8589 Other specified disorders of bone density and structure, multiple sites: Secondary | ICD-10-CM | POA: Insufficient documentation

## 2018-05-31 DIAGNOSIS — C649 Malignant neoplasm of unspecified kidney, except renal pelvis: Secondary | ICD-10-CM | POA: Insufficient documentation

## 2018-05-31 LAB — CBC WITH DIFFERENTIAL/PLATELET
Abs Immature Granulocytes: 0.03 10*3/uL (ref 0.00–0.07)
Basophils Absolute: 0.1 10*3/uL (ref 0.0–0.1)
Basophils Relative: 1 %
Eosinophils Absolute: 0.5 10*3/uL (ref 0.0–0.5)
Eosinophils Relative: 5 %
HCT: 37.1 % (ref 36.0–46.0)
Hemoglobin: 12 g/dL (ref 12.0–15.0)
Immature Granulocytes: 0 %
Lymphocytes Relative: 21 %
Lymphs Abs: 2.1 10*3/uL (ref 0.7–4.0)
MCH: 30.6 pg (ref 26.0–34.0)
MCHC: 32.3 g/dL (ref 30.0–36.0)
MCV: 94.6 fL (ref 80.0–100.0)
Monocytes Absolute: 0.7 10*3/uL (ref 0.1–1.0)
Monocytes Relative: 7 %
Neutro Abs: 6.7 10*3/uL (ref 1.7–7.7)
Neutrophils Relative %: 66 %
Platelets: 230 10*3/uL (ref 150–400)
RBC: 3.92 MIL/uL (ref 3.87–5.11)
RDW: 17.2 % — ABNORMAL HIGH (ref 11.5–15.5)
WBC: 10.1 10*3/uL (ref 4.0–10.5)
nRBC: 0 % (ref 0.0–0.2)

## 2018-05-31 LAB — FERRITIN: Ferritin: 25 ng/mL (ref 11–307)

## 2018-05-31 NOTE — Progress Notes (Signed)
[facility] day:  05/31/2018  Chief Complaint: Amy [name] is a 83 y.o. female with iron deficiency anemia and a history of stage III renal cell carcinoma who is seen for 3 month assessment.  HPI:   The patient was last seen in the medical oncology clinic on 03/01/2018.  At that time, her diarrhea had resolved on Questran.  She had a bed sore.  Hemoglobin was 11.8.  MCV was 95.  Chest CT on 05/26/2018 revealed further enlargement of part solid left upper lobe nodule, potentially adenocarcinoma.  Left upper lobe measured 12 x 8 mm (previously 9 mm maximally). The left lower lobe solid nodule had not significantly changed over the last 3 months, although had enlarged over the last 10 months and could reflect a metastasis.  There was new mild right paratracheal lymphadenopathy. Consider PET-CT for further evaluation. Alternatively, continued short-term CT follow-up could be performed.  During the interim, patient is doing well overall. She denies any acute concerns. Patient is chronically fatigued. She notes some increased shortness of breath at rest. Audible expiratory wheezing noted. Patient denies that she has experienced any B symptoms. She denies any interval infections. No nausea, vomiting, or significant changes to her bowel habits.   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 She requires assistance from her husband to complete the majority of ADLs.   Patient advises that she maintains an adequate appetite. She is eating well.  Patient denies pain in the clinic today.   Past Medical History:  Diagnosis Date  . Anemia    low iron  . Anxiety   . Arthritis   . Bilateral cataracts   . Bronchitis    hx of   . Cataract   . Chronic kidney disease   . Depression   . Diabetes mellitus without complication ([place])   . 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  GERD (gastroesophageal reflux disease)   . Headache   . History of frequent urinary tract infections   . Hx of renal cell cancer    LEFT  . Hyperlipidemia   . Hypertension   . Numbness and tingling    right hand   . Obesity   . Osteopenia 10/05/2017  . Peripheral artery disease ([place])   . Urinary frequency   . Urinary incontinence     Past Surgical History:  Procedure Laterality Date  . ABDOMINAL HYSTERECTOMY  1978  . CARPAL TUNNEL RELEASE Bilateral   . CHOLECYSTECTOMY    . COLONOSCOPY    . COLONOSCOPY WITH ESOPHAGOGASTRODUODENOSCOPY (EGD)    . ERCP N/A 08/19/2017   Procedure: ENDOSCOPIC RETROGRADE CHOLANGIOPANCREATOGRAPHY (ERCP);  Surgeon: Arta Silence, MD;  Location: [facility] ENDOSCOPY;  Service: Endoscopy;  Laterality: N/A;  . LEG AMPUTATION     above the knee / left   . ROBOT ASSISTED LAPAROSCOPIC NEPHRECTOMY Left 01/15/2015   Procedure: ROBOTIC ASSISTED LAPAROSCOPIC RADICAL NEPHRECTOMY;  Surgeon: Alexis Frock, MD;  Location: [facility] ORS;  Service: Urology;  Laterality: Left;  . stent placement right leg       Family History  Problem Relation Age of Onset  . Cancer Mother        Stomach  . Cancer Father   . Diabetes Brother   . Cancer Brother        oral  . Healthy Son   . Healthy Sister  Social History:  reports that she quit smoking about 14 years ago. Her smoking use included cigarettes. She has a 76.50 pack-year smoking history. She has never used smokeless tobacco. She reports previous alcohol use. She reports that she does not use drugs.  She is an Therapist, sports.  She lives in [place].  The patient is accompanied by her husband, [name], today.  Allergies:  Allergies  Allergen Reactions  . Fentanyl Other (See Comments)    urine retention  . Penicillins Itching, Rash and Other (See Comments)    Has patient had a PCN reaction causing immediate rash, facial/tongue/throat swelling, SOB or lightheadedness with hypotension: Yes Has patient had a PCN reaction causing severe rash  involving mucus membranes or skin necrosis: No Has patient had a PCN reaction that required hospitalization: No Has patient had a PCN reaction occurring within the last 10 years: Yes If all of the above answers are "NO", then may proceed with Cephalosporin use.    Current Medications: Current Outpatient Medications  Medication Sig Dispense Refill  . atorvastatin (LIPITOR) 20 MG tablet TAKE 1 TABLET (20 MG TOTAL) BY MOUTH AT BEDTIME. 90 tablet 0  . busPIRone (BUSPAR) 10 MG tablet Take 10 mg by mouth 3 (three) times daily.    . clopidogrel (PLAVIX) 75 MG tablet Take 1 tablet (75 mg total) by mouth daily. 90 tablet 3  . donepezil (ARICEPT) 10 MG tablet Take 1 tablet (10 mg total) by mouth at bedtime. 90 tablet 3  . famotidine (PEPCID) 20 MG tablet Take 1 tablet (20 mg total) by mouth at bedtime. To help prevent heartburn, reflux 90 tablet 1  . furosemide (LASIX) 20 MG tablet Take 40 mg by mouth daily.     [name] gabapentin (NEURONTIN) 300 MG capsule Take 1 capsule (300 mg total) by mouth 3 (three) times daily. 270 capsule 3  . HYDROcodone-acetaminophen (NORCO) 7.5-325 MG tablet Take 1 tablet by mouth every 6 (six) hours as needed.     [name] ipratropium-albuterol (DUONEB) 0.5-2.5 (3) MG/3ML SOLN INHALE THE CONTENTS OF 1 VIAL VIA NEBULIZER THREE TIMES DAILY AS DIRECTED 810 mL 2  . metoprolol tartrate (LOPRESSOR) 50 MG tablet Take 1 tablet (50 mg total) by mouth 2 (two) times daily. 180 tablet 0  . pantoprazole (PROTONIX) 40 MG tablet TAKE 1 TABLET EVERY DAY 90 tablet 0  . QUEtiapine (SEROQUEL) 50 MG tablet Take 50 mg by mouth at bedtime.    [name] UNABLE TO FIND Hoyer Lift; Code: [phone] s/p AKA LEFT 1 each 0  . acetaminophen (TYLENOL) 500 MG tablet Take 1,000 mg by mouth every 6 (six) hours as needed for mild pain, fever or headache.     . albuterol (PROVENTIL HFA;VENTOLIN HFA) 108 (90 Base) MCG/ACT inhaler Inhale 2 puffs into the lungs every 6 (six) hours as needed for wheezing or shortness of breath. (Patient  not taking: Reported on 05/31/2018) 1 Inhaler 2  . Ascorbic Acid (VITAMIN C) 500 MG CHEW One by mouth daily for 2-3 weeks (Patient not taking: Reported on 05/31/2018) 30 each 0  . clotrimazole (LOTRIMIN) 1 % cream Apply 1 application topically 2 (two) times daily. (Patient not taking: Reported on 05/31/2018) 30 g 0  . Zinc 50 MG TABS One by mouth daily for 2-3 weeks (Patient not taking: Reported on 05/31/2018) 30 tablet 0  . Zinc Oxide (BOUDREAUXS BUTT PASTE) 16 % OINT Apply paste after urination or bowel movements (Patient not taking: Reported on 05/31/2018) 454 g 0  . Zinc Oxide (DESITIN RAPID  RELIEF) 13 % CREA Apply 1 application topically 3 (three) times daily as needed. (Patient not taking: Reported on 05/31/2018) 1 Tube 0   No current facility-administered medications for this visit.     Review of Systems:  GENERAL:  Chronic fatigue.  No fevers, sweats. No new weight. PERFORMANCE STATUS (ECOG):  3. HEENT:  No visual changes, runny nose, sore throat, mouth sores or tenderness. Lungs: Shortness of breath.  No cough.  No hemoptysis. Cardiac:  No chest pain, palpitations, orthopnea, or PND. GI:  No nausea, vomiting, diarrhea, constipation, melena or hematochezia. GU:  No urgency, frequency, dysuria, or hematuria. Musculoskeletal:  No back pain.  No joint pain.  No muscle tenderness. Extremities:  No pain or swelling. Skin:  No rashes or skin changes. Neuro:  No headache, numbness or weakness, balance or coordination issues. Endocrine:  No diabetes, thyroid issues, hot flashes or night sweats. Psych:  No mood changes, depression or anxiety. Pain:  No focal pain. Review of systems:  All other systems reviewed and found to be negative.   Physical Exam: Blood pressure 127/76, pulse 69, temperature (!) 96.7 F (35.9 C), temperature source Tympanic, resp. rate 18. GENERAL:  Well developed, well nourished, woman sitting comfortably in a wheelchair in the exam room in no acute distress. MENTAL STATUS:   Alert and oriented to person, place and time. HEAD:  Short black hair.  Normocephalic, atraumatic, face symmetric, no Cushingoid features. EYES: Glasses.  Brown eyes.  Pupils equal round and reactive to light and accomodation.  No conjunctivitis or scleral icterus. ENT:  Oropharynx clear without lesion.  Tongue normal. Mucous membranes moist.  RESPIRATORY:  Intermittent soft wheeze.  Clear to auscultation without rales, or rhonchi. CARDIOVASCULAR:  Regular rate and rhythm without murmur, rub or gallop. ABDOMEN:  Soft, non-tender, with active bowel sounds, and no hepatosplenomegaly.  No masses. SKIN:  No rashes, ulcers or lesions. EXTREMITIES: Left sided above the knee amputation.  No skin discoloration or tenderness.  No palpable cords. LYMPH NODES: No palpable cervical, supraclavicular, axillary or inguinal adenopathy  NEUROLOGICAL: Unremarkable. PSYCH:  Appropriate.    Imaging studies: 11/29/2014:  Abdomen and pelvic CT revealed a 5.5 x 3.7 solid enhancing mass arising from the upper pole of the left kidney.  There was evidence of renal vein invasion.  There were borderline enlarged periaortic lymph node (9 mm).  08/16/2016:  Chest, abdomen, and pelvic CT revealed left nephrectomy without evidence of metastatic disease.  There were low-attenuation lesions in the right kidney, likely cysts although definitive characterization is limited without post-contrast imaging.  There was an ectatic abdominal aorta at risk for aneurysm development.  02/16/2017:  Chest, abdomen and pelvic CT revealed no evidence of recurrent disease.  There was a 7 x 5 mm right upper lobe pulmonary nodule (previously 6 x 4 mm). There was a 3 mm (previously 1-2 mm) left lower lobe pulmonary nodule. 08/12/2017:  Chest CT revealed no active pulmonary disease. Mild centrilobular emphysema. There was stable appearing pleural-based nodular opacity in the anterior aspect of the right upper lobe measuring 5.6 mm. There was stable 4  mm medial left upper lobe pleural-based opacity is unchanged.  08/12/2017:  Abdomen and pelvic CT revealed a 9 mm hyperdensity along the expected location of the distal common bile duct near the ampulla with dilatation of the CBD to 17 mm.  Simple renal cysts of the right kidney were noted with a 1.1 cm hyperdense complex appearing lesion possibly representing a hemorrhagic or proteinaceous cyst.  No worrisome features were identified with respect of this complex lesion.   08/14/2017:  MR abdomen MRCP revealed a solitary 9 mm choledocholith in the mid to upper common bile duct. There was mild diffuse intrahepatic biliary ductal dilatation with dilated CBD (13 mm diameter).  There was indeterminate small 1.2 cm renal cortical mass in the posterior lower right kidney, incompletely characterized on this noncontrast MRI study, renal neoplasm not excluded. Recommend attention on follow-up MRI (preferred) or CT abdomen without and with IV contrast in 6 months.  There was ectatic 2.7 cm infrarenal abdominal aorta.  Ectatic abdominal aorta at risk for aneurysm development. Recommend followup by ultrasound in 5 years. 02/13/2018:  Chest, abdomen, and pelvic CT  revealed interval growth of 8 mm solid medial left lower lobe pulmonary nodule, cannot exclude enlarging pulmonary metastasis. This nodule was slightly below PET resolution. There was interval growth of 9 mm medial left upper lobe ground-glass pulmonary nodule. There were no additional potential findings of metastatic disease.  There was no evidence of local tumor recurrence in the left nephrectomy bed.  There was an ectatic 2.8 cm infrarenal abdominal aorta, stable at risk for aneurysm development.  05/26/2018:  Chest CT revealed further enlargement of part solid left upper lobe nodule, potentially adenocarcinoma.  Left upper lobe measured 12 x 8 mm (previously 9 mm maximally). The left lower lobe solid nodule had not significantly changed over the last 3 months,  although had enlarged over the last 10 months and could reflect a metastasis.  There was new mild right paratracheal lymphadenopathy. Consider PET-CT for further evaluation.  Alternatively, continued short-term CT follow-up could be performed.   Appointment on 05/31/2018  Component Date Value Ref Range Status  . WBC 05/31/2018 10.1  4.0 - 10.5 K/uL Final  . RBC 05/31/2018 3.92  3.87 - 5.11 MIL/uL Final  . Hemoglobin 05/31/2018 12.0  12.0 - 15.0 g/dL Final  . HCT 05/31/2018 37.1  36.0 - 46.0 % Final  . MCV 05/31/2018 94.6  80.0 - 100.0 fL Final  . MCH 05/31/2018 30.6  26.0 - 34.0 pg Final  . MCHC 05/31/2018 32.3  30.0 - 36.0 g/dL Final  . RDW 05/31/2018 17.2* 11.5 - 15.5 % Final  . Platelets 05/31/2018 230  150 - 400 K/uL Final  . nRBC 05/31/2018 0.0  0.0 - 0.2 % Final  . Neutrophils Relative % 05/31/2018 66  % Final  . Neutro Abs 05/31/2018 6.7  1.7 - 7.7 K/uL Final  . Lymphocytes Relative 05/31/2018 21  % Final  . Lymphs Abs 05/31/2018 2.1  0.7 - 4.0 K/uL Final  . Monocytes Relative 05/31/2018 7  % Final  . Monocytes Absolute 05/31/2018 0.7  0.1 - 1.0 K/uL Final  . Eosinophils Relative 05/31/2018 5  % Final  . Eosinophils Absolute 05/31/2018 0.5  0.0 - 0.5 K/uL Final  . Basophils Relative 05/31/2018 1  % Final  . Basophils Absolute 05/31/2018 0.1  0.0 - 0.1 K/uL Final  . Immature Granulocytes 05/31/2018 0  % Final  . Abs Immature Granulocytes 05/31/2018 0.03  0.00 - 0.07 K/uL Final   Performed at [facility], [address], [place], [place] [address]    Assessment:  Amy [name] is a 83 y.o. female with mild iron deficiency anemia.  EGD and colonoscopy on 10/10/2013 were negative per patient.  She has had reflux for the past 15-30 years.  She is on Prilosec.  Diet is poor.  She denies any pica.  Work-up  on 07/14/2016 revealed a hematocrit of 35.5, hemoglobin 11.6, MCV 87.6, platelets 173,000, WBC 7900 with an ANC of 5000.  SPEP revealed no monoclonal protein.   Ferritin was 22 (low).  Folate was 9.8.  Guaiac cards were negative x 3.  She is on oral iron.  She has a history of stage III renal cell carcinoma s/p robotic-assisted laparoscopic left radical nephrectomy on 01/09/2015. Pathology revealed a 6.3 cm renal cell carcinoma.  Tumor was predominantly clear cell with a minor component of type I papillary.  Fuhrman grade 3 of 4.  Tumor extended into the renal vein.  There was focal extension into the perirenal and sinus adipose tissue.  Margins were negative.  There was metastatic tumor in 2 of 2 lymph nodes.  Pathologic stage was T3aN1Mx.    Abdomen and pelvic CT scan on 11/29/2014 revealed a 5.5 x 3.7 solid enhancing mass arising from the upper pole of the left kidney.  There was evidence of renal vein invasion.  There were borderline enlarged periaortic lymph node (9 mm).   Chest CT on 08/12/2017 revealed no active pulmonary disease. Mild centrilobular emphysema. There was stable appearing pleural-based nodular opacity in the anterior aspect of the right upper lobe measuring 5.6 mm. There was stable 4 mm medial left upper lobe pleural-based opacity is unchanged.   Abdomen and pelvic CT on 08/12/2017 revealed a 9 mm hyperdensity along the expected location of the distal common bile duct near the ampulla with dilatation of the CBD to 17 mm.  Simple renal cysts of the right kidney were noted with a 1.1 cm hyperdense complex appearing lesion possibly representing a hemorrhagic or proteinaceous cyst.  No worrisome features were identified with respect of this complex lesion.    Chest, abdomen, and pelvic CT on 02/13/2018 revealed interval growth of 8 mm solid medial left lower lobe pulmonary nodule, cannot exclude enlarging pulmonary metastasis. This nodule was slightly below PET resolution. There was interval growth of 9 mm medial left upper lobe ground-glass pulmonary nodule. There were no additional potential findings of metastatic disease.  There was no  evidence of local tumor recurrence in the left nephrectomy bed.  There was an ectatic 2.8 cm infrarenal abdominal aorta, stable at risk for aneurysm development.   Chest CT on 05/26/2018 revealed further enlargement of part solid left upper lobe nodule, potentially adenocarcinoma.  Left upper lobe measured 12 x 8 mm (previously 9 mm maximally). The left lower lobe solid nodule had not significantly changed over the last 3 months, although had enlarged over the last 10 months and could reflect a metastasis.  There was new mild right paratracheal lymphadenopathy.   She underwent ERCP on 08/19/2017 for chloedocholithiasis.  One pancreatic stent was placed into the ventral pancreatic duct.  A filling defect consistent with a stone was seen on the cholangiogram. The entire main bile duct and common hepatic duct were moderately dilated.  Choledocholithiasis was found. Complete removal was accomplished by biliary sphincterotomy and basket extraction.  She has iron deficiency anemia.  Ferritin was 18 on 08/10/2016, 22 on 09/07/2016, 69 on 12/07/2016, 81 on 02/23/2017, 67 on 08/24/2017, 50 on 02/03/2018, 22 on 03/01/2018, and 25 on 05/31/2018.  She has chronic renal insufficiency.  Creatinine was 1.86 (CrCl 26 ml/min) on 06/15/2016.  She has a history of left lower extremity DVT.  She is s/p left AKA.  Symptomatically, she denies any new complaints.  Exam reveals soft intermittent wheezes.  Hemoglobin is 12.0.  Ferritin is 25.  Plan:  1.  Labs today: CBC with diff, ferritin. 2.  Iron deficiency anemia:  Hemoglobin is 12.0.  Ferritin is 25. 3.  Stage III renal cell carcinoma:  Chest CT on 02/13/2018 revealed a LLL and LUL pulmonary lesion of unclear etiology.  Abdomen and pelvic CT on 02/13/2018 revealed no evidence of local recurrence. 4.  Pulmonary nodules:  Review chest CT from 01/043/2020.  Images personally reviewed.  Agree with radiology interpretation.  Discuss recommendation by radiology for  short term follow-up scan or PET scan.    Discuss plans for PET scan.  Discuss plan for biopsy of slowly growing LUL nodule (3 mm in 3 months) or paratracheal adenopathy if PET positive.   Etiology of LUL nodule unclear as patient has a history of smoking and a history of renal cell carcinoma. 5.  Schedule PET scan on 07/03/2018. 6.  RTC after PET scan for MD assessment and review of imaging.   Honor Loh, NP  05/31/2018, 4:07 PM    I saw and evaluated the patient, participating in the key portions of the service and reviewing pertinent diagnostic studies and records.  I reviewed the nurse practitioner's note and agree with the findings and the plan.  The assessment and plan were discussed with the patient.  Multiple questions were asked by the patient and answered.   Nolon Stalls, MD 05/31/2018, 4:07 PM

## 2018-05-31 NOTE — Progress Notes (Signed)
Here today for follow up w husband the patient stated " I think Im doing ok " ( husband agreed . Per husband pt was in [facility] after husband in [place] -and d/c dec 24/19 )

## 2018-06-01 DIAGNOSIS — G546 Phantom limb syndrome with pain: Secondary | ICD-10-CM | POA: Diagnosis not present

## 2018-06-01 DIAGNOSIS — F0151 Vascular dementia with behavioral disturbance: Secondary | ICD-10-CM | POA: Diagnosis not present

## 2018-06-01 DIAGNOSIS — G309 Alzheimer's disease, unspecified: Secondary | ICD-10-CM | POA: Diagnosis not present

## 2018-06-01 DIAGNOSIS — G4719 Other hypersomnia: Secondary | ICD-10-CM | POA: Diagnosis not present

## 2018-06-01 DIAGNOSIS — F418 Other specified anxiety disorders: Secondary | ICD-10-CM | POA: Diagnosis not present

## 2018-06-01 DIAGNOSIS — F5104 Psychophysiologic insomnia: Secondary | ICD-10-CM | POA: Diagnosis not present

## 2018-06-09 DIAGNOSIS — S31125A Laceration of abdominal wall with foreign body, periumbilic region without penetration into peritoneal cavity, initial encounter: Secondary | ICD-10-CM | POA: Diagnosis not present

## 2018-06-09 DIAGNOSIS — Z89612 Acquired absence of left leg above knee: Secondary | ICD-10-CM | POA: Diagnosis not present

## 2018-06-09 DIAGNOSIS — J42 Unspecified chronic bronchitis: Secondary | ICD-10-CM | POA: Diagnosis not present

## 2018-06-12 ENCOUNTER — Other Ambulatory Visit: Payer: Self-pay | Admitting: Student in an Organized Health Care Education/Training Program

## 2018-06-20 ENCOUNTER — Encounter: Payer: Medicare HMO | Admitting: Student in an Organized Health Care Education/Training Program

## 2018-06-21 ENCOUNTER — Encounter: Payer: Self-pay | Admitting: Student in an Organized Health Care Education/Training Program

## 2018-06-21 ENCOUNTER — Ambulatory Visit
Payer: Medicare HMO | Attending: Student in an Organized Health Care Education/Training Program | Admitting: Student in an Organized Health Care Education/Training Program

## 2018-06-21 ENCOUNTER — Other Ambulatory Visit: Payer: Self-pay

## 2018-06-21 VITALS — HR 81 | Temp 97.6°F | Resp 18 | Ht 62.0 in | Wt 184.0 lb

## 2018-06-21 DIAGNOSIS — Z905 Acquired absence of kidney: Secondary | ICD-10-CM | POA: Diagnosis not present

## 2018-06-21 DIAGNOSIS — N184 Chronic kidney disease, stage 4 (severe): Secondary | ICD-10-CM | POA: Diagnosis not present

## 2018-06-21 DIAGNOSIS — Z89612 Acquired absence of left leg above knee: Secondary | ICD-10-CM | POA: Diagnosis not present

## 2018-06-21 DIAGNOSIS — G301 Alzheimer's disease with late onset: Secondary | ICD-10-CM | POA: Insufficient documentation

## 2018-06-21 DIAGNOSIS — G546 Phantom limb syndrome with pain: Secondary | ICD-10-CM | POA: Insufficient documentation

## 2018-06-21 DIAGNOSIS — G894 Chronic pain syndrome: Secondary | ICD-10-CM | POA: Insufficient documentation

## 2018-06-21 DIAGNOSIS — F1120 Opioid dependence, uncomplicated: Secondary | ICD-10-CM | POA: Diagnosis not present

## 2018-06-21 DIAGNOSIS — F0281 Dementia in other diseases classified elsewhere with behavioral disturbance: Secondary | ICD-10-CM | POA: Insufficient documentation

## 2018-06-21 DIAGNOSIS — F02818 Dementia in other diseases classified elsewhere, unspecified severity, with other behavioral disturbance: Secondary | ICD-10-CM

## 2018-06-21 MED ORDER — GABAPENTIN 300 MG PO CAPS: 300.0000 mg | ORAL_CAPSULE | Freq: Three times a day (TID) | ORAL | 3 refills | Status: DC

## 2018-06-21 MED ORDER — HYDROCODONE-ACETAMINOPHEN 7.5-325 MG PO TABS: 1.0000 | ORAL_TABLET | Freq: Three times a day (TID) | ORAL | 0 refills | Status: DC | PRN

## 2018-06-21 MED ORDER — HYDROCODONE-ACETAMINOPHEN 7.5-325 MG PO TABS: 1.0000 | ORAL_TABLET | Freq: Three times a day (TID) | ORAL | 0 refills | Status: AC | PRN

## 2018-06-21 NOTE — Progress Notes (Signed)
Nursing Pain Medication Assessment:  Safety precautions to be maintained throughout the outpatient stay will include: orient to surroundings, keep bed in low position, maintain call bell within reach at all times, provide assistance with transfer out of bed and ambulation.  Medication Inspection Compliance: Ms. Eversley did not comply with our request to bring her pills to be counted. She was reminded that bringing the medication bottles, even when empty, is a requirement.  Medication: None brought in. Pill/Patch Count: None available to be counted. Bottle Appearance: No container available. Did not bring bottle(s) to appointment. Filled Date: N/A Last Medication intake:  Ran out of medicine more than 48 hours ago

## 2018-06-21 NOTE — Patient Instructions (Signed)
Prescriptions paper X 3 given to pateint for Hydrocodone to last until 09/19/2018.

## 2018-06-21 NOTE — Progress Notes (Signed)
Patient's Name: Amy [name]  MRN: [id number]  Referring Provider: Arnetha Courser, MD  DOB: 1935-09-26  PCP: Arnetha Courser, MD  DOS: 06/21/2018  Note by: Gillis Santa, MD  Service setting: Ambulatory outpatient  Specialty: Interventional Pain Management  Location: [facility] (AMB) Pain Management Facility    Patient type: Established   Primary Reason(s) for Visit: Encounter for prescription drug management. (Level of risk: moderate)  CC: Abdominal Pain (left)  HPI  Ms. Esguerra is a 83 y.o. year old, female patient, who comes today for a medication management evaluation. She has Phantom limb syndrome with pain ([place]); Anxiety; Elevated serum creatinine; Diabetes mellitus type 2, controlled ([place]); GERD (gastroesophageal reflux disease); GAD (generalized anxiety disorder); AB (asthmatic bronchitis); Aortic atherosclerosis ([place]); Cataract; Leg pain; Continuous opioid dependence (HCC); CKD (chronic kidney disease) stage 4, GFR 15-29 ml/min (HCC); COPD, mild ([place]); Excessive falling; Fatty infiltration of liver; H/O adenomatous polyp of colon; Hyperlipidemia; Anterior knee pain; LBP (low back pain); Malaise and fatigue; Abnormal presence of protein in urine; PVD (peripheral vascular disease) ([place]); Allergic rhinitis; At risk for falling; Renal mass; Abnormal EKG; Renal neoplasm; S/p nephrectomy; 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 Her primarily concern today is the Abdominal Pain (left)  Pain Assessment: Location: Left, Lower Abdomen Radiating: denies Onset: More than a month ago Duration: Chronic pain Quality:  Aching, Shooting Severity: 8 /10 (subjective, self-reported pain score)  Note: Reported level is inconsistent with clinical observations.                         When using our objective Pain Scale, levels between 6 and 10/10 are said to belong in an emergency room, as it progressively worsens from a 6/10, described as severely limiting, requiring emergency care not usually available at an outpatient pain management facility. At a 6/10 level, communication becomes difficult and requires great effort. Assistance to reach the emergency department may be required. Facial flushing and profuse sweating along with potentially dangerous increases in heart rate and blood pressure will be evident. Effect on ADL:   Timing: Intermittent Modifying factors: medications BP:    HR: 81  Ms. Deyarmin was last scheduled for an appointment on 06/12/2018 for medication management. During today's appointment we reviewed Ms. Cormier chronic pain status, as well as her outpatient medication regimen.  The patient  reports no history of drug use. Her body mass index is 33.65 kg/m.  Further details on both, my assessment(s), as well as the proposed treatment plan, please see below.  Controlled Substance Pharmacotherapy Assessment REMS (Risk Evaluation and Mitigation Strategy)  Analgesic:Hydrocodone 7.5 mg 3 times daily as needed MME/day:22.54m/day.  SHart Rochester RN  06/21/2018  2:17 PM  Sign when Signing Visit Nursing Pain Medication Assessment:  Safety precautions to be maintained throughout the outpatient stay will include: orient to surroundings, keep bed in low position, maintain call bell within reach at all times, provide assistance with transfer out of bed and ambulation.  Medication Inspection Compliance: Ms. KWanndid not comply with our request to bring her pills to be counted. She was reminded that bringing the medication bottles, even when empty, is a requirement.  Medication: None brought  in. Pill/Patch  Count: None available to be counted. Bottle Appearance: No container available. Did not bring bottle(s) to appointment. Filled Date: N/A Last Medication intake:  Ran out of medicine more than 48 hours ago   Pharmacokinetics: Liberation and absorption (onset of action): WNL Distribution (time to peak effect): WNL Metabolism and excretion (duration of action): WNL         Pharmacodynamics: Desired effects: Analgesia: Ms. Watton reports >50% benefit. Functional ability: Patient reports that medication allows her to accomplish basic ADLs Clinically meaningful improvement in function (CMIF): Sustained CMIF goals met Perceived effectiveness: Described as relatively effective, allowing for increase in activities of daily living (ADL) Undesirable effects: Side-effects or Adverse reactions: None reported Monitoring: [place] PMP: Online review of the past 76-monthperiod conducted. Compliant with practice rules and regulations Last UDS on record: No results found for: SUMMARY UDS interpretation: Compliant          Medication Assessment Form: Reviewed. Patient indicates being compliant with therapy Treatment compliance: Compliant Risk Assessment Profile: Aberrant behavior: See prior evaluations. None observed or detected today Comorbid factors increasing risk of overdose: See prior notes. No additional risks detected today Opioid risk tool (ORT) (Total Score): 1 Personal History of Substance Abuse (SUD-Substance use disorder):  Alcohol: Negative  Illegal Drugs: Negative  Rx Drugs: Negative  ORT Risk Level calculation: Low Risk Risk of substance use disorder (SUD): Low Opioid Risk Tool - 06/21/18 1415      Family History of Substance Abuse   Alcohol  Negative    Illegal Drugs  Negative    Rx Drugs  Negative      Personal History of Substance Abuse   Alcohol  Negative    Illegal Drugs  Negative    Rx Drugs  Negative      Age   Age between 175-45years   No      History  of Preadolescent Sexual Abuse   History of Preadolescent Sexual Abuse  Negative or Female      Psychological Disease   Psychological Disease  Negative    Depression  Positive   and anxiety     Total Score   Opioid Risk Tool Scoring  1    Opioid Risk Interpretation  Low Risk      ORT Scoring interpretation table:  Score <3 = Low Risk for SUD  Score between 4-7 = Moderate Risk for SUD  Score >8 = High Risk for Opioid Abuse   Risk Mitigation Strategies:  Patient Counseling: Covered Patient-Prescriber Agreement (PPA): Present and active  Notification to other healthcare providers: Done  Pharmacologic Plan: No change in therapy, at this time.             Laboratory Chemistry  Inflammation Markers (CRP: Acute Phase) (ESR: Chronic Phase) Lab Results  Component Value Date   ESRSEDRATE 19 01/27/2017                         Rheumatology Markers Lab Results  Component Value Date   RF <14 01/27/2017   ANA NEGATIVE 01/27/2017                        Renal Function Markers Lab Results  Component Value Date   BUN 19 03/01/2018   CREATININE 1.69 (H) 03/01/2018   BCR 9 02/03/2018   GFRAA 32 (L) 03/01/2018   GFRNONAA 27 (L) 03/01/2018  Hepatic Function Markers Lab Results  Component Value Date   AST 26 03/01/2018   ALT 14 03/01/2018   ALBUMIN 2.5 (L) 03/01/2018   ALKPHOS 102 03/01/2018   LIPASE 50 08/12/2017                        Electrolytes Lab Results  Component Value Date   NA 141 03/01/2018   K 3.7 03/01/2018   CL 105 03/01/2018   CALCIUM 7.9 (L) 03/01/2018   MG 1.2 (L) 10/05/2012                        Neuropathy Markers Lab Results  Component Value Date   VITAMINB12 456 03/31/2016   FOLATE 9.8 07/14/2016   HGBA1C 5.8 (H) 02/03/2018                        CNS Tests No results found for: COLORCSF, APPEARCSF, RBCCOUNTCSF, WBCCSF, POLYSCSF, LYMPHSCSF, EOSCSF, PROTEINCSF, GLUCCSF, JCVIRUS, CSFOLI, IGGCSF                       Bone Pathology Markers Lab Results  Component Value Date   VD25OH 17 (L) 02/03/2018                         Coagulation Parameters Lab Results  Component Value Date   PLT 230 05/31/2018                        Cardiovascular Markers Lab Results  Component Value Date   BNP 195 (H) 08/15/2014   TROPONINI <0.03 09/01/2017   HGB 12.0 05/31/2018   HCT 37.1 05/31/2018                         CA Markers No results found for: CEA, CA125, LABCA2                      Note: Lab results reviewed.  Recent Diagnostic Imaging Results  CT Chest Wo Contrast CLINICAL DATA:  Follow up pulmonary nodules. History of left renal cell carcinoma, diabetes and chronic kidney disease.  EXAM: CT CHEST WITHOUT CONTRAST  TECHNIQUE: Multidetector CT imaging of the chest was performed following the standard protocol without IV contrast.  COMPARISON:  Chest CT 02/13/2018 and 08/12/2000  FINDINGS: Cardiovascular: Moderate diffuse atherosclerosis of the aorta, great vessels and coronary arteries. No acute vascular findings on noncontrast imaging. The heart size is normal. There is no pericardial effusion.  Mediastinum/Nodes: Right paratracheal lymph nodes have mildly enlarged compared with the prior studies. These include a 10 mm node on image 39/2, a 7 mm node on image 43/2 and a 16 mm node on image 62/2. No axillary or hilar adenopathy identified. The thyroid gland, trachea and esophagus demonstrate no significant findings.  Lungs/Pleura: There is no pleural effusion. Mild centrilobular and paraseptal emphysema again noted with diffuse central airway thickening. Solid 8 mm left lower lobe nodule on image 101/3 is similar to the most recent study, but enlarged from 08/12/2017. Part solid nodule medially in the left upper lobe has enlarged, measuring 12 x 8 mm on image 38/3 (previously 9 mm maximally). Additional small nodules are stable, including the subpleural nodule anteriorly in the  right upper lobe, measuring 5 x 11 mm on image 92/3. There is minimal peribronchial nodularity  in the right lower lobe, likely inflammatory.  Upper abdomen: Stable appearance of the visualized upper abdomen. Hepatic steatosis and previous left nephrectomy noted.  Musculoskeletal/Chest wall: There is no chest wall mass or suspicious osseous finding. Stable chronic compression deformity at T12. 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  IMPRESSION: 1. Further enlargement of part solid left upper lobe nodule, potentially adenocarcinoma. 2. The left lower lobe solid nodule has not significantly changed over the last 3 months, although has enlarged over the last 10 months and could reflect a metastasis. 3. New mild right paratracheal lymphadenopathy. 4. Consider PET-CT for further evaluation. Alternatively, continued short-term CT follow-up could be performed.  Electronically Signed   By: Richardean Sale M.D.   On: 05/26/2018 14:38  Complexity Note: Imaging results reviewed. Results shared with Ms. Servantes, using Layman's terms.                         Meds   Current Outpatient Medications:  .  acetaminophen (TYLENOL) 500 MG tablet, Take 1,000 mg by mouth every 6 (six) hours as needed for mild pain, fever or headache. , Disp: , Rfl:  .  albuterol (PROVENTIL HFA;VENTOLIN HFA) 108 (90 Base) MCG/ACT inhaler, Inhale 2 puffs into the lungs every 6 (six) hours as needed for wheezing or shortness of breath., Disp: 1 Inhaler, Rfl: 2 .  Ascorbic Acid (VITAMIN C) 500 MG CHEW, One by mouth daily for 2-3 weeks, Disp: 30 each, Rfl: 0 .  atorvastatin (LIPITOR) 20 MG tablet, TAKE 1 TABLET (20 MG TOTAL) BY MOUTH AT BEDTIME., Disp: 90 tablet, Rfl: 0 .  busPIRone (BUSPAR) 10 MG tablet, Take 10 mg by mouth 3 (three) times daily., Disp: , Rfl:  .  clopidogrel (PLAVIX) 75 MG tablet, Take 1 tablet (75 mg total) by mouth daily., Disp: 90 tablet, Rfl: 3 .  donepezil (ARICEPT) 10 MG tablet,  Take 1 tablet (10 mg total) by mouth at bedtime., Disp: 90 tablet, Rfl: 3 .  famotidine (PEPCID) 20 MG tablet, Take 1 tablet (20 mg total) by mouth at bedtime. To help prevent heartburn, reflux, Disp: 90 tablet, Rfl: 1 .  furosemide (LASIX) 20 MG tablet, Take 40 mg by mouth daily. , Disp: , Rfl:  .  gabapentin (NEURONTIN) 300 MG capsule, Take 1 capsule (300 mg total) by mouth 3 (three) times daily., Disp: 270 capsule, Rfl: 3 .  [START ON 07/21/2018] HYDROcodone-acetaminophen (NORCO) 7.5-325 MG tablet, Take 1 tablet by mouth every 8 (eight) hours as needed for up to 30 days. For chronic pain.  To fill on or after: 06-21-2018, 07-21-2018, 08-20-2018, Disp: 90 tablet, Rfl: 0 .  ipratropium-albuterol (DUONEB) 0.5-2.5 (3) MG/3ML SOLN, INHALE THE CONTENTS OF 1 VIAL VIA NEBULIZER THREE TIMES DAILY AS DIRECTED, Disp: 810 mL, Rfl: 2 .  metoprolol tartrate (LOPRESSOR) 50 MG tablet, Take 1 tablet (50 mg total) by mouth 2 (two) times daily., Disp: 180 tablet, Rfl: 0 .  pantoprazole (PROTONIX) 40 MG tablet, TAKE 1 TABLET EVERY DAY, Disp: 90 tablet, Rfl: 0 .  QUEtiapine (SEROQUEL) 50 MG tablet, Take 50 mg by mouth at bedtime., Disp: , Rfl:  .  UNABLE TO FIND, [place] Lift; Code: [phone] s/p AKA LEFT, Disp: 1 each, Rfl: 0 .  Zinc 50 MG TABS, One by mouth daily for 2-3 weeks, Disp: 30 tablet, Rfl: 0 .  Zinc Oxide (BOUDREAUXS BUTT PASTE) 16 % OINT, Apply paste after urination or bowel movements, Disp: 454 g, Rfl: 0 .  Zinc Oxide (DESITIN RAPID RELIEF) 13 % CREA, Apply 1 application topically 3 (three) times daily as needed., Disp: 1 Tube, Rfl: 0 .  clotrimazole (LOTRIMIN) 1 % cream, Apply 1 application topically 2 (two) times daily. (Patient not taking: Reported on 05/31/2018), Disp: 30 g, Rfl: 0  ROS  Constitutional: Denies any fever or chills Gastrointestinal: No reported hemesis, hematochezia, vomiting, or acute GI distress Musculoskeletal: Denies any acute onset joint swelling, redness, loss of ROM, or  weakness Neurological: No reported episodes of acute onset apraxia, aphasia, dysarthria, agnosia, amnesia, paralysis, loss of coordination, or loss of consciousness  Allergies  Ms. Boyan is allergic to fentanyl and penicillins.  PFSH  Drug: Ms. Faeth  reports no history of drug use. Alcohol:  reports previous alcohol use. Tobacco:  reports that she quit smoking about 14 years ago. Her smoking use included cigarettes. She has a 76.50 pack-year smoking history. She has never used smokeless tobacco. Medical:  has a past medical history of Anemia, Anxiety, Arthritis, Bilateral cataracts, Bronchitis, Cataract, Chronic kidney disease, Depression, Diabetes mellitus without complication ([place]), DVT (deep venous thrombosis) ([place]), GERD (gastroesophageal reflux disease), Headache, History of frequent urinary tract infections, renal cell cancer, Hyperlipidemia, Hypertension, Numbness and tingling, Obesity, Osteopenia (10/05/2017), Peripheral artery disease ([place]), Urinary frequency, and Urinary incontinence. Surgical: Ms. Clinkscale  has a past surgical history that includes Leg amputation; Abdominal hysterectomy (1978); Cholecystectomy; Carpal tunnel release (Bilateral); stent placement right leg ; Robot assisted laparoscopic nephrectomy (Left, 01/15/2015); Colonoscopy; Colonoscopy with esophagogastroduodenoscopy (egd); and ERCP (N/A, 08/19/2017). Family: family history includes Cancer in her brother, father, and mother; Diabetes in her brother; Healthy in her sister and son.  Constitutional Exam  General appearance: Well nourished, well developed, and well hydrated. In no apparent acute distress Vitals:   06/21/18 1409  Pulse: 81  Resp: 18  Temp: 97.6 F (36.4 C)  TempSrc: Oral  SpO2: 97%  Weight: 184 lb (83.5 kg)  Height: 5' 2"  (1.575 m)   BMI Assessment: Estimated body mass index is 33.65 kg/m as calculated from the following:   Height as of this encounter: 5' 2"  (1.575 m).   Weight as of this  encounter: 184 lb (83.5 kg).  BMI interpretation table: BMI level Category Range association with higher incidence of chronic pain  <18 kg/m2 Underweight   18.5-24.9 kg/m2 Ideal body weight   25-29.9 kg/m2 Overweight Increased incidence by 20%  30-34.9 kg/m2 Obese (Class I) Increased incidence by 68%  35-39.9 kg/m2 Severe obesity (Class II) Increased incidence by 136%  >40 kg/m2 Extreme obesity (Class III) Increased incidence by 254%   Patient's current BMI Ideal Body weight  Body mass index is 33.65 kg/m. Ideal body weight: 50.1 kg (110 lb 7.2 oz) Adjusted ideal body weight: 63.4 kg (139 lb 13.9 oz)   BMI Readings from Last 4 Encounters:  06/21/18 33.65 kg/m  04/24/18 30.11 kg/m  04/11/18 32.59 kg/m  03/30/18 32.59 kg/m   Wt Readings from Last 4 Encounters:  06/21/18 184 lb (83.5 kg)  04/24/18 170 lb (77.1 kg)  03/28/18 184 lb (83.5 kg)  02/06/18 184 lb (83.5 kg)  Psych/Mental status: Alert, oriented x 3 (person, place, & time)       Eyes: PERLA Respiratory: No evidence of acute respiratory distress  Cervical Spine Area Exam  Skin & Axial Inspection: No masses, redness, edema, swelling, or associated skin lesions Alignment: Symmetrical Functional ROM: Unrestricted ROM      Stability: No instability detected Muscle Tone/Strength: Functionally intact. No obvious neuro-muscular anomalies  detected. Sensory (Neurological): Unimpaired Palpation: No palpable anomalies              Upper Extremity (UE) Exam    Side: Right upper extremity  Side: Left upper extremity  Skin & Extremity Inspection: Skin color, temperature, and hair growth are WNL. No peripheral edema or cyanosis. No masses, redness, swelling, asymmetry, or associated skin lesions. No contractures.  Skin & Extremity Inspection: Skin color, temperature, and hair growth are WNL. No peripheral edema or cyanosis. No masses, redness, swelling, asymmetry, or associated skin lesions. No contractures.  Functional ROM:  Unrestricted ROM          Functional ROM: Unrestricted ROM          Muscle Tone/Strength: Functionally intact. No obvious neuro-muscular anomalies detected.  Muscle Tone/Strength: Functionally intact. No obvious neuro-muscular anomalies detected.  Sensory (Neurological): Unimpaired          Sensory (Neurological): Unimpaired          Palpation: No palpable anomalies              Palpation: No palpable anomalies              Provocative Test(s):  Phalen's test: deferred Tinel's test: deferred Apley's scratch test (touch opposite shoulder):  Action 1 (Across chest): deferred Action 2 (Overhead): deferred Action 3 (LB reach): deferred   Provocative Test(s):  Phalen's test: deferred Tinel's test: deferred Apley's scratch test (touch opposite shoulder):  Action 1 (Across chest): deferred Action 2 (Overhead): deferred Action 3 (LB reach): deferred    Thoracic Spine Area Exam  Skin & Axial Inspection: No masses, redness, or swelling Alignment: Symmetrical Functional ROM: Unrestricted ROM Stability: No instability detected Muscle Tone/Strength: Functionally intact. No obvious neuro-muscular anomalies detected. Sensory (Neurological): Unimpaired Muscle strength & Tone: No palpable anomalies   Lumbar Spine Area Exam  Skin & Axial Inspection:No masses, redness, or swelling Alignment:Symmetrical Functional VHQ:[id number] ROM Stability:No instability detected Muscle Tone/Strength:Functionally intact. No obvious neuro-muscular anomalies detected. Sensory (Neurological):Unimpaired Palpation:No palpable anomalies Provocative Tests: Lumbar Hyperextension and rotation test:evaluation deferred today Lumbar Lateral bending test:evaluation deferred today Patrick's Maneuver:evaluation deferred today  Gait & Posture Assessment  Patient in wheelchair. Significant kyphosis.   Lower Extremity Exam    Side:Right lower extremity   Side:Left lower extremity  Skin & Extremity Inspection:Skin color, temperature, and hair growth are WNL. No peripheral edema or cyanosis. No masses, redness, swelling, asymmetry, or associated skin lesions. No contractures.  Skin & Extremity Inspection:Above knee amputation (AKA)  Functional KGM:[id number] ROM  Functional IHK:[id number] ROM  Muscle Tone/Strength:Functionally intact. No obvious neuro-muscular anomalies detected.  Muscle Tone/Strength:Functionally intact. No obvious neuro-muscular anomalies detected.  Sensory (Neurological):Unimpaired  Sensory (Neurological):Paresthesia (Tingling sensation)  Palpation:No palpable anomalies  Palpation:No palpable anomalies     Assessment  Primary Diagnosis & Pertinent Problem List: The primary encounter diagnosis was Chronic pain syndrome. Diagnoses of Phantom limb syndrome with pain ([place]), CKD (chronic kidney disease) stage 4, GFR 15-29 ml/min (HCC), Continuous opioid dependence (HCC), S/P AKA (above knee amputation) unilateral, left (HCC), 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 ([place]), S/p nephrectomy, and Late onset Alzheimer's disease with behavioral disturbance ([place]) were also pertinent to this visit.  Status Diagnosis  Controlled Controlled Controlled 1. Chronic pain syndrome   2. Phantom limb syndrome with pain ([place])   3. CKD (chronic kidney disease) stage 4, GFR 15-29 ml/min (HCC)   4. Continuous opioid dependence ([place])   5. S/P AKA (above knee amputation) unilateral, left ([place])   6. Phantom pain following  amputation of lower limb ([place])   7. S/p nephrectomy   8. Late onset Alzheimer's disease with behavioral disturbance ([place])      83 year old female with a history of left above-the-knee amputation, phantom limb syndrome, chronic kidney disease, advanced dementia secondary to Alzheimer's disease with phantom limb pain and bilateral hip pain. Patient has been stable on her opioid regimen of  hydrocodone 7.5 mg 3 times daily as needed. She is also on gabapentin 300 mg 3 times daily.Here for medication refill on her hydrocodone and Gabapentin.   Will obtain serum tox screen today.  Rx for 3 months  Plan of Care  Pharmacotherapy (Medications Ordered): Meds ordered this encounter  Medications  . DISCONTD: HYDROcodone-acetaminophen (NORCO) 7.5-325 MG tablet    Sig: Take 1 tablet by mouth every 8 (eight) hours as needed for up to 30 days. For chronic pain.  To fill on or after: 06-21-2018, 07-21-2018, 08-20-2018    Dispense:  90 tablet    Refill:  0  . DISCONTD: HYDROcodone-acetaminophen (NORCO) 7.5-325 MG tablet    Sig: Take 1 tablet by mouth every 8 (eight) hours as needed for up to 30 days. For chronic pain.  To fill on or after: 06-21-2018, 07-21-2018, 08-20-2018    Dispense:  90 tablet    Refill:  0  . HYDROcodone-acetaminophen (NORCO) 7.5-325 MG tablet    Sig: Take 1 tablet by mouth every 8 (eight) hours as needed for up to 30 days. For chronic pain.  To fill on or after: 06-21-2018, 07-21-2018, 08-20-2018    Dispense:  90 tablet    Refill:  0  . DISCONTD: gabapentin (NEURONTIN) 300 MG capsule    Sig: Take 1 capsule (300 mg total) by mouth 3 (three) times daily.    Dispense:  270 capsule    Refill:  3  . gabapentin (NEURONTIN) 300 MG capsule    Sig: Take 1 capsule (300 mg total) by mouth 3 (three) times daily.    Dispense:  270 capsule    Refill:  3   Lab-work, procedure(s), and/or referral(s): Orders Placed This Encounter  Procedures  . Drug Screen 10 W/Conf, Serum      Provider-requested follow-up: Return in about 3 months (around 09/20/2018) for Medication Management.  Future Appointments  Date Time Provider [place]  06/30/2018  9:00 AM [facility] 2 [facility] None  06/30/2018 10:00 AM [facility] None  06/30/2018 10:30 AM Dew, Erskine Squibb, MD [facility] None  07/03/2018 10:40 AM Arnetha Courser, MD [facility]  10/03/2018  2:00 PM [place]  ADVISOR [facility]    Primary Care Physician: Arnetha Courser, MD Location: [facility] Note by: Gillis Santa, M.D Date: 06/21/2018; Time: 2:40 PM  Patient Instructions  Prescriptions paper X 3 given to pateint for Hydrocodone to last until 09/19/2018.

## 2018-06-30 ENCOUNTER — Encounter (INDEPENDENT_AMBULATORY_CARE_PROVIDER_SITE_OTHER): Payer: Self-pay | Admitting: Vascular Surgery

## 2018-06-30 ENCOUNTER — Ambulatory Visit (INDEPENDENT_AMBULATORY_CARE_PROVIDER_SITE_OTHER): Payer: Medicare HMO

## 2018-06-30 ENCOUNTER — Ambulatory Visit (INDEPENDENT_AMBULATORY_CARE_PROVIDER_SITE_OTHER): Payer: Medicare HMO | Admitting: Vascular Surgery

## 2018-06-30 VITALS — BP 142/74 | HR 67 | Resp 18

## 2018-06-30 DIAGNOSIS — Z89612 Acquired absence of left leg above knee: Secondary | ICD-10-CM

## 2018-06-30 DIAGNOSIS — I739 Peripheral vascular disease, unspecified: Secondary | ICD-10-CM

## 2018-06-30 DIAGNOSIS — Z87891 Personal history of nicotine dependence: Secondary | ICD-10-CM | POA: Diagnosis not present

## 2018-06-30 DIAGNOSIS — E1122 Type 2 diabetes mellitus with diabetic chronic kidney disease: Secondary | ICD-10-CM

## 2018-06-30 DIAGNOSIS — N184 Chronic kidney disease, stage 4 (severe): Secondary | ICD-10-CM | POA: Diagnosis not present

## 2018-06-30 NOTE — Progress Notes (Signed)
MRN : [id number]  Amy [name] is a 83 y.o. (March 10, 1936) female who presents with chief complaint of  Chief Complaint  Patient presents with  . Follow-up  .  History of Present Illness: Patient returns today in follow up of of their PAD.  She seems to be continuing to decline cognitively.  Her husband cares for her tremendously well.  She hits her legs on her wheelchair and get some scabs but they generally heal quickly.  No rest pain. Her right ABI today is quite a bit higher than it was at her last visit at 0.85.  Current Outpatient Medications  Medication Sig Dispense Refill  . acetaminophen (TYLENOL) 500 MG tablet Take 1,000 mg by mouth every 6 (six) hours as needed for mild pain, fever or headache.     . albuterol (PROVENTIL HFA;VENTOLIN HFA) 108 (90 Base) MCG/ACT inhaler Inhale 2 puffs into the lungs every 6 (six) hours as needed for wheezing or shortness of breath. 1 Inhaler 2  . Ascorbic Acid (VITAMIN C) 500 MG CHEW One by mouth daily for 2-3 weeks 30 each 0  . atorvastatin (LIPITOR) 20 MG tablet TAKE 1 TABLET (20 MG TOTAL) BY MOUTH AT BEDTIME. 90 tablet 0  . busPIRone (BUSPAR) 10 MG tablet Take 10 mg by mouth 3 (three) times daily.    . clopidogrel (PLAVIX) 75 MG tablet Take 1 tablet (75 mg total) by mouth daily. 90 tablet 3  . donepezil (ARICEPT) 10 MG tablet Take 1 tablet (10 mg total) by mouth at bedtime. 90 tablet 3  . famotidine (PEPCID) 20 MG tablet Take 1 tablet (20 mg total) by mouth at bedtime. To help prevent heartburn, reflux 90 tablet 1  . furosemide (LASIX) 20 MG tablet Take 40 mg by mouth daily.     [name] gabapentin (NEURONTIN) 300 MG capsule Take 1 capsule (300 mg total) by mouth 3 (three) times daily. 270 capsule 3  . [START ON 07/21/2018] HYDROcodone-acetaminophen (NORCO) 7.5-325 MG tablet Take 1 tablet by mouth every 8 (eight) hours as needed for up to 30 days. For chronic pain.  To fill on or after: 06-21-2018, 07-21-2018, 08-20-2018 90 tablet 0  .  ipratropium-albuterol (DUONEB) 0.5-2.5 (3) MG/3ML SOLN INHALE THE CONTENTS OF 1 VIAL VIA NEBULIZER THREE TIMES DAILY AS DIRECTED 810 mL 2  . metoprolol tartrate (LOPRESSOR) 50 MG tablet Take 1 tablet (50 mg total) by mouth 2 (two) times daily. 180 tablet 0  . pantoprazole (PROTONIX) 40 MG tablet TAKE 1 TABLET EVERY DAY 90 tablet 0  . QUEtiapine (SEROQUEL) 50 MG tablet Take 50 mg by mouth at bedtime.    [name] UNABLE TO FIND Hoyer Lift; Code: [phone] s/p AKA LEFT 1 each 0  . Zinc 50 MG TABS One by mouth daily for 2-3 weeks 30 tablet 0  . Zinc Oxide (BOUDREAUXS BUTT PASTE) 16 % OINT Apply paste after urination or bowel movements 454 g 0  . Zinc Oxide (DESITIN RAPID RELIEF) 13 % CREA Apply 1 application topically 3 (three) times daily as needed. 1 Tube 0  . clotrimazole (LOTRIMIN) 1 % cream Apply 1 application topically 2 (two) times daily. (Patient not taking: Reported on 05/31/2018) 30 g 0   No current facility-administered medications for this visit.     Past Medical History:  Diagnosis Date  . Anemia    low iron  . Anxiety   . Arthritis   . Bilateral cataracts   . Bronchitis    hx of   .  Cataract   . Chronic kidney disease   . Depression   . Diabetes mellitus without complication ([place])   . 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 GERD (gastroesophageal reflux disease)   . Headache   . History of frequent urinary tract infections   . Hx of renal cell cancer    LEFT  . Hyperlipidemia   . Hypertension   . Numbness and tingling    right hand   . Obesity   . Osteopenia 10/05/2017  . Peripheral artery disease ([place])   . Urinary frequency   . Urinary incontinence     Past Surgical History:  Procedure Laterality Date  . ABDOMINAL HYSTERECTOMY  1978  . CARPAL TUNNEL RELEASE Bilateral   . CHOLECYSTECTOMY    . COLONOSCOPY    . COLONOSCOPY WITH ESOPHAGOGASTRODUODENOSCOPY (EGD)    . ERCP N/A 08/19/2017   Procedure: ENDOSCOPIC RETROGRADE  CHOLANGIOPANCREATOGRAPHY (ERCP);  Surgeon: Arta Silence, MD;  Location: [facility] ENDOSCOPY;  Service: Endoscopy;  Laterality: N/A;  . LEG AMPUTATION     above the knee / left   . ROBOT ASSISTED LAPAROSCOPIC NEPHRECTOMY Left 01/15/2015   Procedure: ROBOTIC ASSISTED LAPAROSCOPIC RADICAL NEPHRECTOMY;  Surgeon: Alexis Frock, MD;  Location: [facility] ORS;  Service: Urology;  Laterality: Left;  . stent placement right leg       Social History  Substance Use Topics  . Smoking status: Former Smoker    Packs/day: 1.50    Years: 51.00    Types: Cigarettes    Quit date: 05/24/2004  . Smokeless tobacco: Never Used  . Alcohol use No    Family History       Family History  Problem Relation Age of Onset  . Cancer Mother     Stomach  . Cancer Father   . Diabetes Brother   . Cancer Brother          Allergies  Allergen Reactions  . Fentanyl Other (See Comments)    urine retention  . Penicillins Itching and Rash     REVIEW OF SYSTEMS(Negative unless checked)  Constitutional: [] ?Weight loss[] ?Fever[] ?Chills Cardiac:[] ?Chest pain[] ?Chest pressure[] ?Palpitations [] ?Shortness of breath when laying flat [] ?Shortness of breath at rest [] ?Shortness of breath with exertion. Vascular: [x] ?Pain in legs with walking[] ?Pain in legsat rest[] ?Pain in legs when laying flat [] ?Claudication [] ?Pain in feet when walking [] ?Pain in feet at rest [] ?Pain in feet when laying flat [] ?History of DVT [] ?Phlebitis [] ?Swelling in legs [] ?Varicose veins [] ?Non-healing ulcers Pulmonary: [] ?Uses home oxygen [] ?Productive cough[] ?Hemoptysis [] ?Wheeze [] ?COPD [] ?Asthma Neurologic: [] ?Dizziness [] ?Blackouts [] ?Seizures [] ?History of stroke [] ?History of TIA[] ?Aphasia [] ?Temporary blindness[] ?Dysphagia [] ?Weaknessor numbness in arms [] ?Weakness or numbnessin legs Musculoskeletal: [] ?Arthritis [] ?Joint swelling [] ?Joint  pain [] ?Low back pain Hematologic:[] ?Easy bruising[] ?Easy bleeding [] ?Hypercoagulable state [] ?Anemic  Gastrointestinal:[] ?Blood in stool[] ?Vomiting blood[] ?Gastroesophageal reflux/heartburn[] ?Abdominal pain Genitourinary: [] ?Chronic kidney disease [] ?Difficulturination [] ?Frequenturination [] ?Burning with urination[] ?Hematuria Skin: [] ?Rashes [] ?Ulcers [] ?Wounds Psychological: [] ?History of anxiety[] ?History of major depression.     Physical Examination  BP (!) 142/74 (BP Location: Left Wrist, Patient Position: Sitting, Cuff Size: Normal)   Pulse 67   Resp 18  Gen:  WD/WN, NAD Head: [place]/AT, No temporalis wasting. Ear/Nose/Throat: Hearing grossly intact, nares w/o erythema or drainage Eyes: Conjunctiva clear. Sclera non-icteric Neck: Supple.  Trachea midline Pulmonary:  Good air movement, no use of accessory muscles.  Cardiac: RRR, no JVD Vascular:  Vessel Right Left  Radial Palpable Palpable  PT 1+ Palpable Not Palpable  DP 1+ Palpable Not Palpable    Musculoskeletal: M/S 5/5 throughout.  Left AKA well healed. 1+ RLE edema. Neurologic: Sensation grossly intact in extremities.  Symmetrical.  Speech is fluent.  Psychiatric: Judgment and insight are poor Dermatologic: No rashes or ulcers noted.  No cellulitis or open wounds.       Labs Recent Results (from the past 2160 hour(s))  QuantiFERON-TB Gold Plus     Status: None   Collection Time: 04/11/18  3:46 PM  Result Value Ref Range   QuantiFERON-TB Gold Plus NEGATIVE NEGATIVE    Comment: Negative test result. M. tuberculosis complex  infection unlikely.    NIL 0.06 IU/mL   Mitogen-NIL >10.00 IU/mL   TB1-NIL <0.00 IU/mL   TB2-NIL <0.00 IU/mL    Comment: . The Nil tube value reflects the background interferon gamma immune response of the patient's blood sample. This value has been subtracted from the patient's displayed TB and Mitogen  results. . Lower than expected results with the Mitogen tube prevent false-negative Quantiferon readings by detecting a patient with a potential immune suppressive condition and/or suboptimal pre-analytical specimen handling. . The TB1 Antigen tube is coated with the M. tuberculosis-specific antigens designed to elicit responses from TB antigen primed CD4+ helper T-lymphocytes. . The TB2 Antigen tube is coated with the M. tuberculosis-specific antigens designed to elicit responses from TB antigen primed CD4+ helper and CD8+ cytotoxic T-lymphocytes. . For additional information, please refer to https://education.questdiagnostics.com/faq/FAQ204 (This link is being provided for informational/ educational purposes only.) .   Urine Culture     Status: Abnormal   Collection Time: 04/24/18 12:13 AM  Result Value Ref Range   Specimen Description      URINE, RANDOM Performed at [facility], [address] [place], [place] [address]    Special Requests      NONE Performed at [facility], [address] [place], [place] [address]    Culture (A)     >=100,000 COLONIES/mL MORGANELLA MORGANII 50,000 COLONIES/mL ESCHERICHIA COLI    Report Status 04/28/2018 FINAL    Organism ID, Bacteria MORGANELLA MORGANII (A)    Organism ID, Bacteria ESCHERICHIA COLI (A)       Susceptibility   Escherichia coli - MIC*    AMPICILLIN >=32 RESISTANT Resistant     CEFAZOLIN <=4 SENSITIVE Sensitive     CEFTRIAXONE <=1 SENSITIVE Sensitive     CIPROFLOXACIN <=0.25 SENSITIVE Sensitive     GENTAMICIN <=1 SENSITIVE Sensitive     IMIPENEM <=0.25 SENSITIVE Sensitive     NITROFURANTOIN 64 INTERMEDIATE Intermediate     TRIMETH/SULFA <=20 SENSITIVE Sensitive     AMPICILLIN/SULBACTAM 16 INTERMEDIATE Intermediate     PIP/TAZO <=4 SENSITIVE Sensitive     Extended ESBL NEGATIVE Sensitive     * 50,000 COLONIES/mL ESCHERICHIA COLI   Morganella morganii - MIC*    AMPICILLIN >=32 RESISTANT  Resistant     CEFAZOLIN >=64 RESISTANT Resistant     CEFTRIAXONE <=1 SENSITIVE Sensitive     CIPROFLOXACIN <=0.25 SENSITIVE Sensitive     GENTAMICIN <=1 SENSITIVE Sensitive     IMIPENEM 4 SENSITIVE Sensitive     NITROFURANTOIN 128 RESISTANT Resistant     TRIMETH/SULFA <=20 SENSITIVE Sensitive     AMPICILLIN/SULBACTAM >=32 RESISTANT Resistant     PIP/TAZO <=4 SENSITIVE Sensitive     * >=100,000 COLONIES/mL MORGANELLA MORGANII  Urinalysis, Complete w Microscopic     Status: Abnormal   Collection Time: 04/24/18 10:50 PM  Result  Value Ref Range   Color, Urine YELLOW (A) YELLOW   APPearance CLOUDY (A) CLEAR   Specific Gravity, Urine 1.012 1.005 - 1.030   pH 6.0 5.0 - 8.0   Glucose, UA NEGATIVE NEGATIVE mg/dL   Hgb urine dipstick NEGATIVE NEGATIVE   Bilirubin Urine NEGATIVE NEGATIVE   Ketones, ur NEGATIVE NEGATIVE mg/dL   Protein, ur NEGATIVE NEGATIVE mg/dL   Nitrite NEGATIVE NEGATIVE   Leukocytes, UA LARGE (A) NEGATIVE   RBC / HPF 0-5 0 - 5 RBC/hpf   WBC, UA >50 (H) 0 - 5 WBC/hpf   Bacteria, UA NONE SEEN NONE SEEN   Squamous Epithelial / LPF 6-10 0 - 5   WBC Clumps PRESENT    Mucus PRESENT    Non Squamous Epithelial PRESENT (A) NONE SEEN    Comment: Performed at [facility], [address] [place], [place] [address]  Ferritin     Status: None   Collection Time: 05/31/18  3:44 PM  Result Value Ref Range   Ferritin 25 11 - 307 ng/mL    Comment: Performed at [facility], [address] [place], [place] [address]  CBC with Differential/Platelet     Status: Abnormal   Collection Time: 05/31/18  3:44 PM  Result Value Ref Range   WBC 10.1 4.0 - 10.5 K/uL   RBC 3.92 3.87 - 5.11 MIL/uL   Hemoglobin 12.0 12.0 - 15.0 g/dL   HCT 37.1 36.0 - 46.0 %   MCV 94.6 80.0 - 100.0 fL   MCH 30.6 26.0 - 34.0 pg   MCHC 32.3 30.0 - 36.0 g/dL   RDW 17.2 (H) 11.5 - 15.5 %   Platelets 230 150 - 400 K/uL   nRBC 0.0 0.0 - 0.2 %   Neutrophils Relative % 66 %   Neutro Abs 6.7  1.7 - 7.7 K/uL   Lymphocytes Relative 21 %   Lymphs Abs 2.1 0.7 - 4.0 K/uL   Monocytes Relative 7 %   Monocytes Absolute 0.7 0.1 - 1.0 K/uL   Eosinophils Relative 5 %   Eosinophils Absolute 0.5 0.0 - 0.5 K/uL   Basophils Relative 1 %   Basophils Absolute 0.1 0.0 - 0.1 K/uL   Immature Granulocytes 0 %   Abs Immature Granulocytes 0.03 0.00 - 0.07 K/uL    Comment: Performed at [facility], [address], [place], [place] [address]    Radiology No results found.  Assessment/Plan Diabetes mellitus type 2, controlled blood glucose control important in reducing the progression of atherosclerotic disease. Also, involved in wound healing. On appropriate medications.   BP (high blood pressure) blood pressure control important in reducing the progression of atherosclerotic disease. On appropriate oral medications.   S/P AKA (above knee amputation) unilateral, left ([place]) About 11 years ago. Stable and well healed. With her cognitive decline she is no longer able to use her prosthesis.  PVD (peripheral vascular disease) ([place]) Her right ABI today is quite a bit higher than it was at her last visit at 0.85.  No current worrisome symptoms in the right leg.  She has known significant vascular disease but this has been stable now for a while.  Plan to recheck her in about 6 months or sooner if problems develop in the interim.    Leotis Pain, MD  06/30/2018 10:51 AM    This note was created with Dragon medical transcription system.  Any errors from dictation are purely unintentional

## 2018-06-30 NOTE — Assessment & Plan Note (Signed)
Her right ABI today is quite a bit higher than it was at her last visit at 0.85.  No current worrisome symptoms in the right leg.  She has known significant vascular disease but this has been stable now for a while.  Plan to recheck her in about 6 months or sooner if problems develop in the interim.

## 2018-06-30 NOTE — Patient Instructions (Signed)
Peripheral Vascular Disease  Peripheral vascular disease (PVD) is a disease of the blood vessels that are not part of your heart and brain. A simple term for PVD is poor circulation. In most cases, PVD narrows the blood vessels that carry blood from your heart to the rest of your body. This can reduce the supply of blood to your arms, legs, and internal organs, like your stomach or kidneys. However, PVD most often affects a person's lower legs and feet. Without treatment, PVD tends to get worse. PVD can also lead to acute ischemic limb. This is when an arm or leg suddenly cannot get enough blood. This is a medical emergency. Follow these instructions at home: Lifestyle  Do not use any products that contain nicotine or tobacco, such as cigarettes and e-cigarettes. If you need help quitting, ask your doctor.  Lose weight if you are overweight. Or, stay at a healthy weight as told by your doctor.  Eat a diet that is low in fat and cholesterol. If you need help, ask your doctor.  Exercise regularly. Ask your doctor for activities that are right for you. General instructions  Take over-the-counter and prescription medicines only as told by your doctor.  Take good care of your feet: ? Wear comfortable shoes that fit well. ? Check your feet often for any cuts or sores.  Keep all follow-up visits as told by your doctor This is important. Contact a doctor if:  You have cramps in your legs when you walk.  You have leg pain when you are at rest.  You have coldness in a leg or foot.  Your skin changes.  You are unable to get or have an erection (erectile dysfunction).  You have cuts or sores on your feet that do not heal. Get help right away if:  Your arm or leg turns cold, numb, and blue.  Your arms or legs become red, warm, swollen, painful, or numb.  You have chest pain.  You have trouble breathing.  You suddenly have weakness in your face, arm, or leg.  You become very  confused or you cannot speak.  You suddenly have a very bad headache.  You suddenly cannot see. Summary  Peripheral vascular disease (PVD) is a disease of the blood vessels.  A simple term for PVD is poor circulation. Without treatment, PVD tends to get worse.  Treatment may include exercise, low fat and low cholesterol diet, and quitting smoking. This information is not intended to replace advice given to you by your health care provider. Make sure you discuss any questions you have with your health care provider. Document Released: 08/04/2009 Document Revised: 06/17/2016 Document Reviewed: 06/17/2016 Elsevier Interactive Patient Education  2019 Elsevier Inc.  

## 2018-07-02 ENCOUNTER — Other Ambulatory Visit: Payer: Self-pay

## 2018-07-02 ENCOUNTER — Inpatient Hospital Stay
Admission: EM | Admit: 2018-07-02 | Discharge: 2018-07-03 | DRG: 193 | Disposition: A | Discharge status: Home / Self Care | Payer: Medicare HMO | Attending: Internal Medicine | Admitting: Internal Medicine

## 2018-07-02 ENCOUNTER — Emergency Department: Payer: Medicare HMO

## 2018-07-02 DIAGNOSIS — E1136 Type 2 diabetes mellitus with diabetic cataract: Secondary | ICD-10-CM | POA: Diagnosis not present

## 2018-07-02 DIAGNOSIS — R918 Other nonspecific abnormal finding of lung field: Secondary | ICD-10-CM | POA: Diagnosis not present

## 2018-07-02 DIAGNOSIS — E1122 Type 2 diabetes mellitus with diabetic chronic kidney disease: Secondary | ICD-10-CM | POA: Diagnosis not present

## 2018-07-02 DIAGNOSIS — R0902 Hypoxemia: Secondary | ICD-10-CM

## 2018-07-02 DIAGNOSIS — F419 Anxiety disorder, unspecified: Secondary | ICD-10-CM | POA: Diagnosis present

## 2018-07-02 DIAGNOSIS — J440 Chronic obstructive pulmonary disease with acute lower respiratory infection: Secondary | ICD-10-CM | POA: Diagnosis present

## 2018-07-02 DIAGNOSIS — J189 Pneumonia, unspecified organism: Secondary | ICD-10-CM | POA: Diagnosis not present

## 2018-07-02 DIAGNOSIS — E1151 Type 2 diabetes mellitus with diabetic peripheral angiopathy without gangrene: Secondary | ICD-10-CM | POA: Diagnosis present

## 2018-07-02 DIAGNOSIS — Z89612 Acquired absence of left leg above knee: Secondary | ICD-10-CM

## 2018-07-02 DIAGNOSIS — C649 Malignant neoplasm of unspecified kidney, except renal pelvis: Secondary | ICD-10-CM

## 2018-07-02 DIAGNOSIS — Z905 Acquired absence of kidney: Secondary | ICD-10-CM

## 2018-07-02 DIAGNOSIS — Z87440 Personal history of urinary (tract) infections: Secondary | ICD-10-CM

## 2018-07-02 DIAGNOSIS — Z79891 Long term (current) use of opiate analgesic: Secondary | ICD-10-CM

## 2018-07-02 DIAGNOSIS — M8580 Other specified disorders of bone density and structure, unspecified site: Secondary | ICD-10-CM | POA: Diagnosis present

## 2018-07-02 DIAGNOSIS — Z833 Family history of diabetes mellitus: Secondary | ICD-10-CM

## 2018-07-02 DIAGNOSIS — R9431 Abnormal electrocardiogram [ECG] [EKG]: Secondary | ICD-10-CM | POA: Diagnosis present

## 2018-07-02 DIAGNOSIS — Z85528 Personal history of other malignant neoplasm of kidney: Secondary | ICD-10-CM

## 2018-07-02 DIAGNOSIS — J9601 Acute respiratory failure with hypoxia: Secondary | ICD-10-CM | POA: Diagnosis not present

## 2018-07-02 DIAGNOSIS — Z809 Family history of malignant neoplasm, unspecified: Secondary | ICD-10-CM

## 2018-07-02 DIAGNOSIS — J40 Bronchitis, not specified as acute or chronic: Secondary | ICD-10-CM | POA: Diagnosis present

## 2018-07-02 DIAGNOSIS — R200 Anesthesia of skin: Secondary | ICD-10-CM | POA: Diagnosis present

## 2018-07-02 DIAGNOSIS — J441 Chronic obstructive pulmonary disease with (acute) exacerbation: Secondary | ICD-10-CM | POA: Diagnosis present

## 2018-07-02 DIAGNOSIS — I129 Hypertensive chronic kidney disease with stage 1 through stage 4 chronic kidney disease, or unspecified chronic kidney disease: Secondary | ICD-10-CM | POA: Diagnosis present

## 2018-07-02 DIAGNOSIS — N183 Chronic kidney disease, stage 3 (moderate): Secondary | ICD-10-CM | POA: Diagnosis present

## 2018-07-02 DIAGNOSIS — K219 Gastro-esophageal reflux disease without esophagitis: Secondary | ICD-10-CM | POA: Diagnosis present

## 2018-07-02 DIAGNOSIS — Z90710 Acquired absence of both cervix and uterus: Secondary | ICD-10-CM

## 2018-07-02 DIAGNOSIS — E669 Obesity, unspecified: Secondary | ICD-10-CM | POA: Diagnosis present

## 2018-07-02 DIAGNOSIS — E119 Type 2 diabetes mellitus without complications: Secondary | ICD-10-CM | POA: Diagnosis not present

## 2018-07-02 DIAGNOSIS — Z95820 Peripheral vascular angioplasty status with implants and grafts: Secondary | ICD-10-CM

## 2018-07-02 DIAGNOSIS — M1990 Unspecified osteoarthritis, unspecified site: Secondary | ICD-10-CM | POA: Diagnosis present

## 2018-07-02 DIAGNOSIS — R32 Unspecified urinary incontinence: Secondary | ICD-10-CM | POA: Diagnosis present

## 2018-07-02 DIAGNOSIS — J029 Acute pharyngitis, unspecified: Secondary | ICD-10-CM | POA: Diagnosis not present

## 2018-07-02 DIAGNOSIS — E785 Hyperlipidemia, unspecified: Secondary | ICD-10-CM | POA: Diagnosis present

## 2018-07-02 DIAGNOSIS — F0390 Unspecified dementia without behavioral disturbance: Secondary | ICD-10-CM | POA: Diagnosis present

## 2018-07-02 DIAGNOSIS — D638 Anemia in other chronic diseases classified elsewhere: Secondary | ICD-10-CM

## 2018-07-02 DIAGNOSIS — R350 Frequency of micturition: Secondary | ICD-10-CM | POA: Diagnosis present

## 2018-07-02 DIAGNOSIS — R202 Paresthesia of skin: Secondary | ICD-10-CM | POA: Diagnosis present

## 2018-07-02 DIAGNOSIS — E860 Dehydration: Secondary | ICD-10-CM | POA: Diagnosis not present

## 2018-07-02 DIAGNOSIS — J181 Lobar pneumonia, unspecified organism: Secondary | ICD-10-CM | POA: Diagnosis not present

## 2018-07-02 DIAGNOSIS — D649 Anemia, unspecified: Secondary | ICD-10-CM | POA: Diagnosis not present

## 2018-07-02 DIAGNOSIS — N179 Acute kidney failure, unspecified: Secondary | ICD-10-CM | POA: Diagnosis not present

## 2018-07-02 DIAGNOSIS — Z87891 Personal history of nicotine dependence: Secondary | ICD-10-CM | POA: Diagnosis not present

## 2018-07-02 DIAGNOSIS — F329 Major depressive disorder, single episode, unspecified: Secondary | ICD-10-CM | POA: Diagnosis present

## 2018-07-02 LAB — GLUCOSE, CAPILLARY
Glucose-Capillary: 160 mg/dL — ABNORMAL HIGH (ref 70–99)
Glucose-Capillary: 187 mg/dL — ABNORMAL HIGH (ref 70–99)

## 2018-07-02 LAB — BASIC METABOLIC PANEL
Anion gap: 7 (ref 5–15)
BUN: 25 mg/dL — ABNORMAL HIGH (ref 8–23)
CO2: 25 mmol/L (ref 22–32)
CREATININE: 1.91 mg/dL — AB (ref 0.44–1.00)
Calcium: 8.2 mg/dL — ABNORMAL LOW (ref 8.9–10.3)
Chloride: 107 mmol/L (ref 98–111)
GFR calc Af Amer: 28 mL/min — ABNORMAL LOW (ref >60)
GFR calc non Af Amer: 24 mL/min — ABNORMAL LOW (ref >60)
Glucose, Bld: 117 mg/dL — ABNORMAL HIGH (ref 70–99)
Potassium: 3.6 mmol/L (ref 3.5–5.1)
Sodium: 139 mmol/L (ref 135–145)

## 2018-07-02 LAB — OPIATES,MS,WB/SP RFX
6-Acetylmorphine: NEGATIVE
CODEINE: NEGATIVE ng/mL
Dihydrocodeine: 3.4 ng/mL
Hydrocodone: 16.2 ng/mL
Hydromorphone: NEGATIVE ng/mL
Morphine: NEGATIVE ng/mL
OPIATE CONFIRMATION: POSITIVE

## 2018-07-02 LAB — DRUG SCREEN 10 W/CONF, SERUM
AMPHETAMINES, IA: NEGATIVE ng/mL
BARBITURATES, IA: NEGATIVE ug/mL
BENZODIAZEPINES, IA: NEGATIVE ng/mL
COCAINE & METABOLITE, IA: NEGATIVE ng/mL
Methadone, IA: NEGATIVE ng/mL
OPIATES, IA: POSITIVE ng/mL — AB
Oxycodones, IA: NEGATIVE ng/mL
PROPOXYPHENE, IA: NEGATIVE ng/mL
Phencyclidine, IA: NEGATIVE ng/mL
THC(Marijuana) Metabolite, IA: NEGATIVE ng/mL

## 2018-07-02 LAB — INFLUENZA PANEL BY PCR (TYPE A & B)
Influenza A By PCR: NEGATIVE
Influenza B By PCR: NEGATIVE

## 2018-07-02 LAB — TROPONIN I: Troponin I: <0.03 ng/mL (ref <0.03)

## 2018-07-02 LAB — CBC
HCT: 36.2 % (ref 36.0–46.0)
Hemoglobin: 11.4 g/dL — ABNORMAL LOW (ref 12.0–15.0)
MCH: 29.6 pg (ref 26.0–34.0)
MCHC: 31.5 g/dL (ref 30.0–36.0)
MCV: 94 fL (ref 80.0–100.0)
Platelets: 306 10*3/uL (ref 150–400)
RBC: 3.85 MIL/uL — ABNORMAL LOW (ref 3.87–5.11)
RDW: 15.8 % — AB (ref 11.5–15.5)
WBC: 12.1 10*3/uL — ABNORMAL HIGH (ref 4.0–10.5)
nRBC: 0 % (ref 0.0–0.2)

## 2018-07-02 LAB — MAGNESIUM: Magnesium: 1.9 mg/dL (ref 1.7–2.4)

## 2018-07-02 LAB — OXYCODONES,MS,WB/SP RFX
OXYCODONES CONFIRMATION: NEGATIVE
Oxycocone: NEGATIVE ng/mL
Oxymorphone: NEGATIVE ng/mL

## 2018-07-02 LAB — BRAIN NATRIURETIC PEPTIDE: B Natriuretic Peptide: 109 pg/mL — ABNORMAL HIGH (ref 0.0–100.0)

## 2018-07-02 MED ORDER — ALBUTEROL SULFATE (2.5 MG/3ML) 0.083% IN NEBU
5.0000 mg | INHALATION_SOLUTION | Freq: Once | RESPIRATORY_TRACT | Status: AC
  Administered 2018-07-02: 5 mg via RESPIRATORY_TRACT
  Filled 2018-07-02: qty 6

## 2018-07-02 MED ORDER — SODIUM CHLORIDE 0.9 % IV SOLN
100.0000 mg | Freq: Two times a day (BID) | INTRAVENOUS | Status: DC
  Administered 2018-07-02 – 2018-07-03 (×2): 100 mg via INTRAVENOUS
  Filled 2018-07-02 (×3): qty 100

## 2018-07-02 MED ORDER — SODIUM CHLORIDE 0.9 % IV SOLN
INTRAVENOUS | Status: DC
  Administered 2018-07-02 – 2018-07-03 (×2): via INTRAVENOUS

## 2018-07-02 MED ORDER — GUAIFENESIN-DM 100-10 MG/5ML PO SYRP: 5.0000 mL | ORAL_SOLUTION | ORAL | Status: DC | PRN

## 2018-07-02 MED ORDER — GABAPENTIN 300 MG PO CAPS
300.0000 mg | ORAL_CAPSULE | Freq: Three times a day (TID) | ORAL | Status: DC
  Administered 2018-07-02 – 2018-07-03 (×3): 300 mg via ORAL
  Filled 2018-07-02 (×3): qty 1

## 2018-07-02 MED ORDER — ATORVASTATIN CALCIUM 20 MG PO TABS
20.0000 mg | ORAL_TABLET | Freq: Every day | ORAL | Status: DC
  Administered 2018-07-02: 20 mg via ORAL
  Filled 2018-07-02: qty 1

## 2018-07-02 MED ORDER — ACETAMINOPHEN 325 MG PO TABS: 650.0000 mg | ORAL_TABLET | Freq: Four times a day (QID) | ORAL | Status: DC | PRN

## 2018-07-02 MED ORDER — INSULIN ASPART 100 UNIT/ML ~~LOC~~ SOLN
0.0000 [IU] | Freq: Three times a day (TID) | SUBCUTANEOUS | Status: DC
  Administered 2018-07-02: 2 [IU] via SUBCUTANEOUS
  Filled 2018-07-02: qty 1

## 2018-07-02 MED ORDER — HEPARIN SODIUM (PORCINE) 5000 UNIT/ML IJ SOLN
5000.0000 [IU] | Freq: Three times a day (TID) | INTRAMUSCULAR | Status: DC
  Administered 2018-07-02 (×2): 5000 [IU] via SUBCUTANEOUS
  Filled 2018-07-02 (×2): qty 1

## 2018-07-02 MED ORDER — CLOPIDOGREL BISULFATE 75 MG PO TABS
75.0000 mg | ORAL_TABLET | Freq: Every day | ORAL | Status: DC
  Administered 2018-07-03: 75 mg via ORAL
  Filled 2018-07-02: qty 1

## 2018-07-02 MED ORDER — HYDROCODONE-ACETAMINOPHEN 5-325 MG PO TABS
1.0000 | ORAL_TABLET | ORAL | Status: DC | PRN
  Administered 2018-07-02: 2 via ORAL
  Filled 2018-07-02: qty 2

## 2018-07-02 MED ORDER — SODIUM CHLORIDE 0.9 % IV BOLUS
500.0000 mL | Freq: Once | INTRAVENOUS | Status: AC
  Administered 2018-07-02: 500 mL via INTRAVENOUS

## 2018-07-02 MED ORDER — SENNOSIDES-DOCUSATE SODIUM 8.6-50 MG PO TABS: 1.0000 | ORAL_TABLET | Freq: Every evening | ORAL | Status: DC | PRN

## 2018-07-02 MED ORDER — BUSPIRONE HCL 10 MG PO TABS
10.0000 mg | ORAL_TABLET | Freq: Three times a day (TID) | ORAL | Status: DC
  Administered 2018-07-02 – 2018-07-03 (×3): 10 mg via ORAL
  Filled 2018-07-02 (×5): qty 1

## 2018-07-02 MED ORDER — ACETAMINOPHEN 650 MG RE SUPP: 650.0000 mg | Freq: Four times a day (QID) | RECTAL | Status: DC | PRN

## 2018-07-02 MED ORDER — METHYLPREDNISOLONE SODIUM SUCC 125 MG IJ SOLR
125.0000 mg | INTRAMUSCULAR | Status: AC
  Administered 2018-07-02: 125 mg via INTRAVENOUS
  Filled 2018-07-02: qty 2

## 2018-07-02 MED ORDER — INSULIN ASPART 100 UNIT/ML ~~LOC~~ SOLN: 0.0000 [IU] | Freq: Every day | SUBCUTANEOUS | Status: DC

## 2018-07-02 MED ORDER — BISACODYL 5 MG PO TBEC: 5.0000 mg | DELAYED_RELEASE_TABLET | Freq: Every day | ORAL | Status: DC | PRN

## 2018-07-02 MED ORDER — DOXYCYCLINE HYCLATE 100 MG PO TABS
100.0000 mg | ORAL_TABLET | Freq: Once | ORAL | Status: AC
  Administered 2018-07-02: 100 mg via ORAL
  Filled 2018-07-02: qty 1

## 2018-07-02 NOTE — Progress Notes (Signed)
Advanced Care Plan.  Purpose of Encounter: CODE STATUS. Parties in Attendance: The patient, her daughter-in-law and me. Patient's Decisional Capacity: Yes. Medical Story: Amy [name]  is a 83 y.o. female with a known history of hypertension, diabetes, anemia, arthritis, bronchitis, CKD, renal cell cancer PAD and urinary incontinence.  The patient is being admitted for acute respiratory failure with hypoxia due to pneumonia.  I discussed with the patient about her current condition, prognosis and CODE STATUS.  The patient wants to be resuscitated and intubated if she has cardiopulmonary arrest. Plan:  Code Status: Full code. Time spent discussing advance care planning: 17 minutes.

## 2018-07-02 NOTE — ED Notes (Signed)
This RN unable to collect labs from established IV. Lab called for collect.

## 2018-07-02 NOTE — ED Notes (Signed)
ED TO INPATIENT HANDOFF REPORT  Name/Age/Gender Amy [name] 83 y.o. female  Code Status Code Status History    Date Active Date Inactive Code Status Order ID Comments User Context   08/13/2017 0332 08/15/2017 1824 Full Code [id number]  Lance Coon, MD Inpatient   01/15/2015 2127 01/17/2015 1541 Full Code [id number]  Star Age, MD Inpatient    Advance Directive Documentation     Most Recent Value  Type of Advance Directive  Healthcare Power of Attorney  Pre-existing out of facility DNR order (yellow form or pink MOST form)  -  "MOST" Form in Place?  -      Home/SNF/Other home  Chief Complaint sore throat   Level of Care/Admitting Diagnosis ED Disposition    ED Disposition Condition [place]: [place] [[id number]]  Level of Care: Med-Surg [16]  Diagnosis: Pneumonia [227785]  Admitting Physician: Demetrios Loll [[id number]]  Attending Physician: Demetrios Loll [[id number]]  Estimated length of stay: past midnight tomorrow  Certification:: I certify this patient will need inpatient services for at least 2 midnights  PT Class (Do Not Modify): Inpatient [101]  PT Acc Code (Do Not Modify): Private [1]       Medical History Past Medical History:  Diagnosis Date  . Anemia    low iron  . Anxiety   . Arthritis   . Bilateral cataracts   . Bronchitis    hx of   . Cataract   . Chronic kidney disease   . Depression   . Diabetes mellitus without complication ([place])   . 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 GERD (gastroesophageal reflux disease)   . Headache   . History of frequent urinary tract infections   . Hx of renal cell cancer    LEFT  . Hyperlipidemia   . Hypertension   . Numbness and tingling    right hand   . Obesity   . Osteopenia 10/05/2017  . Peripheral artery disease ([place])   . Urinary frequency   . Urinary incontinence     Allergies Allergies  Allergen Reactions  .  Fentanyl Other (See Comments)    urine retention  . Penicillins Itching, Rash and Other (See Comments)    Has patient had a PCN reaction causing immediate rash, facial/tongue/throat swelling, SOB or lightheadedness with hypotension: Yes Has patient had a PCN reaction causing severe rash involving mucus membranes or skin necrosis: No Has patient had a PCN reaction that required hospitalization: No Has patient had a PCN reaction occurring within the last 10 years: Yes If all of the above answers are "NO", then may proceed with Cephalosporin use.    IV Location/Drains/Wounds Patient Lines/Drains/Airways Status   Active Line/Drains/Airways    Name:   Placement date:   Placement time:   Site:   Days:   Peripheral IV 07/02/18 Right;Upper Arm   07/02/18    0850    Arm   less than 1   GI Stent 5 Fr.   08/19/17    1225    -   317          Labs/Imaging Results for orders placed or performed during the hospital encounter of 07/02/18 (from the past 48 hour(s))  Brain natriuretic peptide     Status: Abnormal   Collection Time: 07/02/18  9:52 AM  Result Value Ref Range   B Natriuretic Peptide 109.0 (H) 0.0 -  100.0 pg/mL    Comment: Performed at [facility], [address] [place], [place] [address]  CBC     Status: Abnormal   Collection Time: 07/02/18 10:03 AM  Result Value Ref Range   WBC 12.1 (H) 4.0 - 10.5 K/uL   RBC 3.85 (L) 3.87 - 5.11 MIL/uL   Hemoglobin 11.4 (L) 12.0 - 15.0 g/dL   HCT 36.2 36.0 - 46.0 %   MCV 94.0 80.0 - 100.0 fL   MCH 29.6 26.0 - 34.0 pg   MCHC 31.5 30.0 - 36.0 g/dL   RDW 15.8 (H) 11.5 - 15.5 %   Platelets 306 150 - 400 K/uL   nRBC 0.0 0.0 - 0.2 %    Comment: Performed at [facility], [address] [place], [place] [address]  Basic metabolic panel     Status: Abnormal   Collection Time: 07/02/18 10:03 AM  Result Value Ref Range   Sodium 139 135 - 145 mmol/L   Potassium 3.6 3.5 - 5.1 mmol/L   Chloride 107 98 - 111 mmol/L   CO2 25 22 -  32 mmol/L   Glucose, Bld 117 (H) 70 - 99 mg/dL   BUN 25 (H) 8 - 23 mg/dL   Creatinine, Ser 1.91 (H) 0.44 - 1.00 mg/dL   Calcium 8.2 (L) 8.9 - 10.3 mg/dL   GFR calc non Af Amer 24 (L) >60 mL/min   GFR calc Af Amer 28 (L) >60 mL/min   Anion gap 7 5 - 15    Comment: Performed at [facility], [address] [place], [place] [address]  Troponin I - ONCE - STAT     Status: None   Collection Time: 07/02/18 10:03 AM  Result Value Ref Range   Troponin I <0.03 <0.03 ng/mL    Comment: Performed at [facility], [address] [place], [place] [address]  Influenza panel by PCR (type A & B)     Status: None   Collection Time: 07/02/18 10:50 AM  Result Value Ref Range   Influenza A By PCR NEGATIVE NEGATIVE   Influenza B By PCR NEGATIVE NEGATIVE    Comment: (NOTE) The Xpert Xpress Flu assay is intended as an aid in the diagnosis of  influenza and should not be used as a sole basis for treatment.  This  assay is FDA approved for nasopharyngeal swab specimens only. Nasal  washings and aspirates are unacceptable for Xpert Xpress Flu testing. Performed at [facility], [address] [place], [place] [address]    Dg Chest 2 View  Result Date: 07/02/2018 CLINICAL DATA:  83 year old female with a history of sore throat EXAM: CHEST - 2 VIEW COMPARISON:  02/06/2018, CT 02/13/2018, CT chest 05/26/2018 FINDINGS: Cardiomediastinal silhouette unchanged in size and contour. No central vascular congestion. No interlobular septal thickening. Low lung volumes. Coarsened interstitial markings persist. Mixed nodular and airspace opacity at the right lung base, in the distribution of nodularity on prior CT. IMPRESSION: Low lung volumes with reticulonodular opacity at the right lung base, potentially bronchiolitis/pneumonia versus atelectasis and scarring. Electronically Signed   By: Corrie Mckusick D.O.   On: 07/02/2018 08:41    Pending Labs Unresulted Labs (From admission, onward)     Start     Ordered   07/02/18 0959  Blood culture (single)  ONCE - STAT,   STAT     07/02/18 0958   07/02/18 0906  Blood culture (single)  ONCE - STAT,   STAT  07/02/18 0906   Signed and Held  Creatinine, serum  (heparin)  Once,   R    Comments:  Baseline for heparin therapy IF NOT ALREADY DRAWN.    Signed and Held   Signed and Held  Basic metabolic panel  Tomorrow morning,   R     Signed and Held   Signed and Held  CBC  Tomorrow morning,   R     Signed and Held   Signed and Held  Magnesium  Add-on,   R     Signed and Held   Signed and Held  Culture, sputum-assessment  Once,   R     Signed and Held   Signed and Held  Gram stain  Once,   R     Signed and Held   Signed and Held  Strep pneumoniae urinary antigen  Once,   R     Signed and Held          Vitals/Pain Today's Vitals   07/02/18 0930 07/02/18 0945 07/02/18 1100 07/02/18 1145  BP: 121/65 129/62 113/67 110/87  Pulse: 69 69 75 75  Resp: 13 16 16 17   Temp:      TempSrc:      SpO2: 93% 94% 94% 94%  Weight:      Height:      PainSc:        Isolation Precautions No active isolations  Medications Medications  albuterol (PROVENTIL) (2.5 MG/3ML) 0.083% nebulizer solution 5 mg (5 mg Nebulization Given 07/02/18 0825)  sodium chloride 0.9 % bolus 500 mL (0 mLs Intravenous Stopped 07/02/18 1022)  methylPREDNISolone sodium succinate (SOLU-MEDROL) 125 mg/2 mL injection 125 mg (125 mg Intravenous Given 07/02/18 0915)  doxycycline (VIBRA-TABS) tablet 100 mg (100 mg Oral Given 07/02/18 0915)    Mobility wheelchair

## 2018-07-02 NOTE — ED Notes (Signed)
MD Quale at bedside. 

## 2018-07-02 NOTE — ED Triage Notes (Signed)
Pt c/o sore throat that started last pm - c/o runny nose and generalized body aches

## 2018-07-02 NOTE — ED Provider Notes (Signed)
[facility] Emergency Department Provider Note ___________________________________________   First MD Initiated Contact with Patient 07/02/18 [phone]     (approximate)  I have reviewed the triage vital signs and the nursing notes.  HISTORY  Chief Complaint Sore Throat  HPI DEVENEY BAYON is a 83 y.o. female presents for evaluation of sore throat and cough   Patient does have a history of diabetes, chronic kidney disease, COPD  She reports that this morning and last night she been experiencing a sore throat and cough.  Some shortness of breath with this as well.  No noted fevers but some body aches.  No runny nose.  She does have COPD and uses nebulizers at home.  Denies chest pain.  No nausea vomiting.  No diarrhea  Past Medical History:  Diagnosis Date  . Anemia    low iron  . Anxiety   . Arthritis   . Bilateral cataracts   . Bronchitis    hx of   . Cataract   . Chronic kidney disease   . Depression   . Diabetes mellitus without complication ([place])   . 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 GERD (gastroesophageal reflux disease)   . Headache   . History of frequent urinary tract infections   . Hx of renal cell cancer    LEFT  . Hyperlipidemia   . Hypertension   . Numbness and tingling    right hand   . Obesity   . Osteopenia 10/05/2017  . Peripheral artery disease ([place])   . Urinary frequency   . Urinary incontinence     Patient Active Problem List   Diagnosis Date Noted  . Lung nodule 04/23/2018  . Hypertensive renal disease 11/08/2017  . Secondary hyperparathyroidism of renal origin ([place]) 11/08/2017  . QT prolongation 10/27/2017  . Osteopenia 10/05/2017  . Choledocholithiasis 08/13/2017  . Chronic pain syndrome 04/05/2017  . Depression 11/15/2016  . Iron deficiency anemia 09/08/2016  . Anemia 06/29/2016  . Late onset Alzheimer's disease with behavioral disturbance ([place])  06/14/2016  . S/P AKA (above knee amputation) unilateral, left ([place]) 06/08/2016  . Wheelchair dependent 03/11/2016  . 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 S/p nephrectomy 01/29/2015  . Renal neoplasm 01/15/2015  . Abnormal EKG 01/02/2015  . Renal mass 12/05/2014  . GERD (gastroesophageal reflux disease) 11/20/2014  . GAD (generalized anxiety disorder) 11/20/2014  . AB (asthmatic bronchitis) 11/20/2014  . Aortic atherosclerosis ([place]) 11/20/2014  . Cataract 11/20/2014  . Leg pain 11/20/2014  . Continuous opioid dependence ([place]) 11/20/2014  . CKD (chronic kidney disease) stage 4, GFR 15-29 ml/min (HCC) 11/20/2014  . COPD, mild ([place]) 11/20/2014  . Excessive falling 11/20/2014  . Fatty infiltration of liver 11/20/2014  . Hyperlipidemia 11/20/2014  . Anterior knee pain 11/20/2014  . LBP (low back pain) 11/20/2014  . Malaise and fatigue 11/20/2014  . Abnormal presence of protein in urine 11/20/2014  . PVD (peripheral vascular disease) ([place]) 11/20/2014  . Allergic rhinitis 11/20/2014  . At risk for falling 11/20/2014  . Phantom limb syndrome with pain ([place]) 11/05/2014  . Anxiety 11/05/2014  . Elevated serum creatinine 11/05/2014  . Diabetes mellitus type 2, controlled ([place]) 11/05/2014  . H/O adenomatous polyp of colon 07/27/2010    Past Surgical History:  Procedure Laterality Date  . ABDOMINAL HYSTERECTOMY  1978  . CARPAL TUNNEL RELEASE Bilateral   . CHOLECYSTECTOMY    .  COLONOSCOPY    . COLONOSCOPY WITH ESOPHAGOGASTRODUODENOSCOPY (EGD)    . ERCP N/A 08/19/2017   Procedure: ENDOSCOPIC RETROGRADE CHOLANGIOPANCREATOGRAPHY (ERCP);  Surgeon: Arta Silence, MD;  Location: [facility] ENDOSCOPY;  Service: Endoscopy;  Laterality: N/A;  . LEG AMPUTATION     above the knee / left   . ROBOT ASSISTED LAPAROSCOPIC NEPHRECTOMY Left 01/15/2015   Procedure: ROBOTIC ASSISTED LAPAROSCOPIC RADICAL NEPHRECTOMY;  Surgeon: Alexis Frock, MD;  Location: [facility] ORS;  Service:  Urology;  Laterality: Left;  . stent placement right leg       Prior to Admission medications   Medication Sig Start Date End Date Taking? Authorizing Provider  acetaminophen (TYLENOL) 500 MG tablet Take 1,000 mg by mouth every 6 (six) hours as needed for mild pain, fever or headache.     [provider]  albuterol (PROVENTIL HFA;VENTOLIN HFA) 108 (90 Base) MCG/ACT inhaler Inhale 2 puffs into the lungs every 6 (six) hours as needed for wheezing or shortness of breath. 06/15/17   Roselee Nova, MD  Ascorbic Acid (VITAMIN C) 500 MG CHEW One by mouth daily for 2-3 weeks 02/03/18   Arnetha Courser, MD  atorvastatin (LIPITOR) 20 MG tablet TAKE 1 TABLET (20 MG TOTAL) BY MOUTH AT BEDTIME. 04/14/18   Lada, Satira Anis, MD  busPIRone (BUSPAR) 10 MG tablet Take 10 mg by mouth 3 (three) times daily.    [provider]  clopidogrel (PLAVIX) 75 MG tablet Take 1 tablet (75 mg total) by mouth daily. 07/28/17   Algernon Huxley, MD  clotrimazole (LOTRIMIN) 1 % cream Apply 1 application topically 2 (two) times daily. Patient not taking: Reported on 05/31/2018 04/25/18   Laban Emperor, PA-C  donepezil (ARICEPT) 10 MG tablet Take 1 tablet (10 mg total) by mouth at bedtime. 10/06/17   Arnetha Courser, MD  famotidine (PEPCID) 20 MG tablet Take 1 tablet (20 mg total) by mouth at bedtime. To help prevent heartburn, reflux 04/05/18   Lada, Satira Anis, MD  furosemide (LASIX) 20 MG tablet Take 40 mg by mouth daily.  09/02/17   [provider]  gabapentin (NEURONTIN) 300 MG capsule Take 1 capsule (300 mg total) by mouth 3 (three) times daily. 06/21/18   Gillis Santa, MD  HYDROcodone-acetaminophen (NORCO) 7.5-325 MG tablet Take 1 tablet by mouth every 8 (eight) hours as needed for up to 30 days. For chronic pain.  To fill on or after: 06-21-2018, 07-21-2018, 08-20-2018 07/21/18 08/20/18  Gillis Santa, MD  ipratropium-albuterol (DUONEB) 0.5-2.5 (3) MG/3ML SOLN INHALE THE CONTENTS OF 1 VIAL VIA NEBULIZER THREE  TIMES DAILY AS DIRECTED 11/10/17   Laverle Hobby, MD  metoprolol tartrate (LOPRESSOR) 50 MG tablet Take 1 tablet (50 mg total) by mouth 2 (two) times daily. 06/15/17   Roselee Nova, MD  pantoprazole (PROTONIX) 40 MG tablet TAKE 1 TABLET EVERY DAY 04/14/18   Lada, Satira Anis, MD  QUEtiapine (SEROQUEL) 50 MG tablet Take 50 mg by mouth at bedtime. 10/27/17   [provider]  UNABLE TO [place]; Code: O24.235 s/p AKA LEFT 05/22/18   Arnetha Courser, MD  Zinc 50 MG TABS One by mouth daily for 2-3 weeks 02/03/18   Arnetha Courser, MD  Zinc Oxide (BOUDREAUXS BUTT PASTE) 16 % OINT Apply paste after urination or bowel movements 04/24/18   Laban Emperor, PA-C  Zinc Oxide (DESITIN RAPID RELIEF) 13 % CREA Apply 1 application topically 3 (three) times daily as needed. 04/25/18   Gregor Hams,  MD    Allergies Fentanyl and Penicillins  Family History  Problem Relation Age of Onset  . Cancer Mother        Stomach  . Cancer Father   . Diabetes Brother   . Cancer Brother        oral  . Healthy Son   . Healthy Sister     Social History Social History   Tobacco Use  . Smoking status: Former Smoker    Packs/day: 1.50    Years: 51.00    Pack years: 76.50    Types: Cigarettes    Last attempt to quit: 05/24/2004    Years since quitting: 14.1  . Smokeless tobacco: Never Used  . Tobacco comment: smoking cessation materials not required  Substance Use Topics  . Alcohol use: Not Currently  . Drug use: No    Review of Systems Constitutional: No fever/chills some body aches and fatigue since last night Eyes: No visual changes. ENT: Throat feels scratchy.  No trouble breathing.  Felt earlier like her throat was having trouble breathing, but that seems to be better Cardiovascular: Denies chest pain. Respiratory: Denies shortness of breath now but was short of breath earlier at home, does not use oxygen at home.  Feels better on oxygen.  Frequent dry cough feels like she can  get up. Gastrointestinal: No abdominal pain.   Genitourinary: Negative for dysuria. Musculoskeletal: Negative for back pain. Skin: Negative for rash. Neurological: Negative for headaches, areas of focal weakness or numbness.    ____________________________________________   PHYSICAL EXAM:  VITAL SIGNS: ED Triage Vitals  Enc Vitals Group     BP 07/02/18 0736 (!) 85/46     Pulse Rate 07/02/18 0736 (!) 58     Resp 07/02/18 0736 16     Temp 07/02/18 0736 98.1 F (36.7 C)     Temp Source 07/02/18 0736 Oral     SpO2 07/02/18 0736 (!) 89 %     Weight 07/02/18 0734 184 lb (83.5 kg)     Height 07/02/18 0734 5\' 2"  (1.575 m)     Head Circumference --      Peak Flow --      Pain Score 07/02/18 0734 8     Pain Loc --      Pain Edu? --      Excl. in [place]? --     Constitutional: Alert and oriented.  Mildly ill-appearing but in no acute distress. Eyes: Conjunctivae are normal. Head: Atraumatic. Nose: No congestion/rhinnorhea. Mouth/Throat: Mucous membranes are moist. Neck: No stridor.  Posterior oropharynx just slightly injected.  There is no swelling, no tonsillar hypertrophy, uvula midline without any masses.  No angioedema.  No elevation of floor of tongue.  No tenderness of or swelling of the anterior neck.  No stridor. Cardiovascular: Normal rate, regular rhythm. Grossly normal heart sounds.  Good peripheral circulation. Respiratory: Normal respiratory effort.  No retractions.  Notable rhonchorous lung sounds or crackles primarily over the right lower lobe.  Left lung clear.  Slight end expiratory wheezing throughout. Gastrointestinal: Soft and nontender. No distention. Musculoskeletal: No lower extremity tenderness nor edema. Neurologic:  Normal speech and language. No gross focal neurologic deficits are appreciated.  Skin:  Skin is warm, dry and intact. No rash noted. Psychiatric: Mood and affect are normal. Speech and behavior are  normal.  ____________________________________________   LABS (all labs ordered are listed, but only abnormal results are displayed)  Labs Reviewed  CBC - Abnormal; Notable for the following components:  Result Value   WBC 12.1 (*)    RBC 3.85 (*)    Hemoglobin 11.4 (*)    RDW 15.8 (*)    All other components within normal limits  BASIC METABOLIC PANEL - Abnormal; Notable for the following components:   Glucose, Bld 117 (*)    BUN 25 (*)    Creatinine, Ser 1.91 (*)    Calcium 8.2 (*)    GFR calc non Af Amer 24 (*)    GFR calc Af Amer 28 (*)    All other components within normal limits  CULTURE, BLOOD (SINGLE)  CULTURE, BLOOD (SINGLE)  TROPONIN I  BRAIN NATRIURETIC PEPTIDE  INFLUENZA PANEL BY PCR (TYPE A & B)   ____________________________________________  EKG  Reviewed at 8 AM by me interpreted, Heart rate 60 QRS 90 QTc 460 Normal sinus rhythm, T wave abnormality V2 and V3 with inversion, no ST elevation ____________________________________________  RADIOLOGY  Dg Chest 2 View  Result Date: 07/02/2018 CLINICAL DATA:  83 year old female with a history of sore throat EXAM: CHEST - 2 VIEW COMPARISON:  02/06/2018, CT 02/13/2018, CT chest 05/26/2018 FINDINGS: Cardiomediastinal silhouette unchanged in size and contour. No central vascular congestion. No interlobular septal thickening. Low lung volumes. Coarsened interstitial markings persist. Mixed nodular and airspace opacity at the right lung base, in the distribution of nodularity on prior CT. IMPRESSION: Low lung volumes with reticulonodular opacity at the right lung base, potentially bronchiolitis/pneumonia versus atelectasis and scarring. Electronically Signed   By: Corrie Mckusick D.O.   On: 07/02/2018 08:41    ____________________________________________   PROCEDURES  Procedure(s) performed: None  Procedures  Critical Care performed: No  ____________________________________________   INITIAL IMPRESSION /  ASSESSMENT AND PLAN / ED COURSE  Pertinent labs & imaging results that were available during my care of the patient were reviewed by me and considered in my medical decision making (see chart for details).   Patient presents for evaluation of sore throat, body aches, and hypoxia.  Notable rhonchi in right lower lobe on exam.  Given the clinical history, review of x-rays, elevated white count, and hypoxia suspect patient is likely developing pneumonia, possibly superimposed viral illness or consider influenza which is currently pending.  Denies cardiac symptoms.  Troponin normal.  Normal level of alertness, also treating for COPD showing improvement after treatments here.  No pleuritic pain, no signs or symptoms of DVT PE.  Clinical Course as of Jul 02 1049  Sun Jul 02, 2018  0957 Delay in obtaining labs, charge nurse nurse with lab at the bedside working to obtain them now.  Blood pressure improved, patient appears improved. Await lab work. Suspect pnumonia, abx ordered.   [MQ]    Clinical Course User Index [MQ] Delman Kitten, MD   Discussed case with Dr. Bridgett Larsson.  Will admit for further work-up.  Given the patient's prolonged QT, avoid pro long medications and thus decision made to treat with doxycycline.  Denies any recent inpatient hospitalizations, has not been recently on an antibiotic according to patient and family.  Also have contacted Dr. Mike Gip who will follow up with inpatient team regarding last notes about PET scanning and surveillance for the potential cancer or tumor which I am unsure if is related to today's presentation or not.  Dr. Bridgett Larsson aware, influenza test pending at time of admission ____________________________________________   FINAL CLINICAL IMPRESSION(S) / ED DIAGNOSES  Final diagnoses:  Community acquired pneumonia of right lower lobe of lung ([place])  Pharyngitis, unspecified etiology  Hypoxemia  COPD exacerbation ([place])        Note:  This document was prepared  using Systems analyst and may include unintentional dictation errors       Delman Kitten, MD 07/02/18 1051

## 2018-07-02 NOTE — ED Notes (Signed)
RN called for follow up for lab collect.

## 2018-07-02 NOTE — ED Notes (Signed)
Admitting MD at bedside.

## 2018-07-02 NOTE — ED Notes (Signed)
Lactic Acid not collected by lab. Order discontinued by verbal order from MD.

## 2018-07-02 NOTE — H&P (Signed)
[facility] NAME: Amy [name]    MR#:  [id number]  DATE OF BIRTH:  1936-03-24  DATE OF ADMISSION:  07/02/2018  PRIMARY CARE PHYSICIAN: Arnetha Courser, MD   REQUESTING/REFERRING PHYSICIAN: Dr. Jacqualine Code  CHIEF COMPLAINT:   Chief Complaint  Patient presents with  . Sore Throat   Cough and sore throat since last night. HISTORY OF PRESENT ILLNESS:  Amy [name]  is a 83 y.o. female with a known history of hypertension, diabetes, anemia, arthritis, bronchitis, CKD, renal cell cancer PAD and urinary incontinence.  The patient presents the ED with above chief complaints.  She denies any fever or chills but has muscle aching and nausea.  Chest x-ray show right-sided pneumonia.  WBC 12.1.  She is found hypoxia at 80s and treated with doxycycline, IV Solu-Medrol and DuoNeb in the ED.  PAST MEDICAL HISTORY:   Past Medical History:  Diagnosis Date  . Anemia    low iron  . Anxiety   . Arthritis   . Bilateral cataracts   . Bronchitis    hx of   . Cataract   . Chronic kidney disease   . Depression   . Diabetes mellitus without complication ([place])   . 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 GERD (gastroesophageal reflux disease)   . Headache   . History of frequent urinary tract infections   . Hx of renal cell cancer    LEFT  . Hyperlipidemia   . Hypertension   . Numbness and tingling    right hand   . Obesity   . Osteopenia 10/05/2017  . Peripheral artery disease ([place])   . Urinary frequency   . Urinary incontinence     PAST SURGICAL HISTORY:   Past Surgical History:  Procedure Laterality Date  . ABDOMINAL HYSTERECTOMY  1978  . CARPAL TUNNEL RELEASE Bilateral   . CHOLECYSTECTOMY    . COLONOSCOPY    . COLONOSCOPY WITH ESOPHAGOGASTRODUODENOSCOPY (EGD)    . ERCP N/A 08/19/2017   Procedure: ENDOSCOPIC RETROGRADE CHOLANGIOPANCREATOGRAPHY (ERCP);  Surgeon: Arta Silence,  MD;  Location: [facility] ENDOSCOPY;  Service: Endoscopy;  Laterality: N/A;  . LEG AMPUTATION     above the knee / left   . ROBOT ASSISTED LAPAROSCOPIC NEPHRECTOMY Left 01/15/2015   Procedure: ROBOTIC ASSISTED LAPAROSCOPIC RADICAL NEPHRECTOMY;  Surgeon: Alexis Frock, MD;  Location: [facility] ORS;  Service: Urology;  Laterality: Left;  . stent placement right leg       SOCIAL HISTORY:   Social History   Tobacco Use  . Smoking status: Former Smoker    Packs/day: 1.50    Years: 51.00    Pack years: 76.50    Types: Cigarettes    Last attempt to quit: 05/24/2004    Years since quitting: 14.1  . Smokeless tobacco: Never Used  . Tobacco comment: smoking cessation materials not required  Substance Use Topics  . Alcohol use: Not Currently    FAMILY HISTORY:   Family History  Problem Relation Age of Onset  . Cancer Mother        Stomach  . Cancer Father   . Diabetes Brother   . Cancer Brother        oral  . Healthy Son   . Healthy Sister     DRUG ALLERGIES:   Allergies  Allergen Reactions  . Fentanyl Other (See Comments)    urine retention  .  Penicillins Itching, Rash and Other (See Comments)    Has patient had a PCN reaction causing immediate rash, facial/tongue/throat swelling, SOB or lightheadedness with hypotension: Yes Has patient had a PCN reaction causing severe rash involving mucus membranes or skin necrosis: No Has patient had a PCN reaction that required hospitalization: No Has patient had a PCN reaction occurring within the last 10 years: Yes If all of the above answers are "NO", then may proceed with Cephalosporin use.    REVIEW OF SYSTEMS:   Review of Systems  Constitutional: Positive for malaise/fatigue. Negative for chills and fever.  HENT: Positive for sore throat.   Eyes: Negative for blurred vision and double vision.  Respiratory: Positive for cough and shortness of breath. Negative for hemoptysis, sputum production, wheezing and stridor.   Cardiovascular:  Negative for chest pain, palpitations, orthopnea and leg swelling.  Gastrointestinal: Negative for abdominal pain, blood in stool, diarrhea, melena, nausea and vomiting.  Genitourinary: Negative for dysuria, flank pain and hematuria.  Musculoskeletal: Negative for back pain and joint pain.  Neurological: Negative for dizziness, sensory change, focal weakness, seizures, loss of consciousness, weakness and headaches.  Endo/Heme/Allergies: Negative for polydipsia.  Psychiatric/Behavioral: Negative for depression. The patient is not nervous/anxious.     MEDICATIONS AT HOME:   Prior to Admission medications   Medication Sig Start Date End Date Taking? Authorizing Provider  acetaminophen (TYLENOL) 500 MG tablet Take 1,000 mg by mouth every 6 (six) hours as needed for mild pain, fever or headache.     [provider]  albuterol (PROVENTIL HFA;VENTOLIN HFA) 108 (90 Base) MCG/ACT inhaler Inhale 2 puffs into the lungs every 6 (six) hours as needed for wheezing or shortness of breath. 06/15/17   Roselee Nova, MD  Ascorbic Acid (VITAMIN C) 500 MG CHEW One by mouth daily for 2-3 weeks 02/03/18   Arnetha Courser, MD  atorvastatin (LIPITOR) 20 MG tablet TAKE 1 TABLET (20 MG TOTAL) BY MOUTH AT BEDTIME. 04/14/18   Lada, Satira Anis, MD  busPIRone (BUSPAR) 10 MG tablet Take 10 mg by mouth 3 (three) times daily.    [provider]  clopidogrel (PLAVIX) 75 MG tablet Take 1 tablet (75 mg total) by mouth daily. 07/28/17   Algernon Huxley, MD  clotrimazole (LOTRIMIN) 1 % cream Apply 1 application topically 2 (two) times daily. Patient not taking: Reported on 05/31/2018 04/25/18   Laban Emperor, PA-C  donepezil (ARICEPT) 10 MG tablet Take 1 tablet (10 mg total) by mouth at bedtime. 10/06/17   Arnetha Courser, MD  famotidine (PEPCID) 20 MG tablet Take 1 tablet (20 mg total) by mouth at bedtime. To help prevent heartburn, reflux 04/05/18   Lada, Satira Anis, MD  furosemide (LASIX) 20 MG tablet Take 40 mg by  mouth daily.  09/02/17   [provider]  gabapentin (NEURONTIN) 300 MG capsule Take 1 capsule (300 mg total) by mouth 3 (three) times daily. 06/21/18   Gillis Santa, MD  HYDROcodone-acetaminophen (NORCO) 7.5-325 MG tablet Take 1 tablet by mouth every 8 (eight) hours as needed for up to 30 days. For chronic pain.  To fill on or after: 06-21-2018, 07-21-2018, 08-20-2018 07/21/18 08/20/18  Gillis Santa, MD  ipratropium-albuterol (DUONEB) 0.5-2.5 (3) MG/3ML SOLN INHALE THE CONTENTS OF 1 VIAL VIA NEBULIZER THREE TIMES DAILY AS DIRECTED 11/10/17   Laverle Hobby, MD  metoprolol tartrate (LOPRESSOR) 50 MG tablet Take 1 tablet (50 mg total) by mouth 2 (two) times daily. 06/15/17   Rochel Brome  A, MD  pantoprazole (PROTONIX) 40 MG tablet TAKE 1 TABLET EVERY DAY 04/14/18   Lada, Satira Anis, MD  QUEtiapine (SEROQUEL) 50 MG tablet Take 50 mg by mouth at bedtime. 10/27/17   [provider]  UNABLE TO [place]; Code: G18.299 s/p AKA LEFT 05/22/18   Arnetha Courser, MD  Zinc 50 MG TABS One by mouth daily for 2-3 weeks 02/03/18   Arnetha Courser, MD  Zinc Oxide (BOUDREAUXS BUTT PASTE) 16 % OINT Apply paste after urination or bowel movements 04/24/18   Laban Emperor, PA-C  Zinc Oxide (DESITIN RAPID RELIEF) 13 % CREA Apply 1 application topically 3 (three) times daily as needed. 04/25/18   Gregor Hams, MD      VITAL SIGNS:  Blood pressure 113/67, pulse 75, temperature 97.8 F (36.6 C), temperature source Oral, resp. rate 16, height 5\' 2"  (1.575 m), weight 73.6 kg, SpO2 94 %.  PHYSICAL EXAMINATION:  Physical Exam  GENERAL:  83 y.o.-year-old patient lying in the bed with no acute distress.  EYES: Pupils equal, round, reactive to light and accommodation. No scleral icterus. Extraocular muscles intact.  HEENT: Head atraumatic, normocephalic. Oropharynx and nasopharynx clear.  NECK:  Supple, no jugular venous distention. No thyroid enlargement, no tenderness.  LUNGS: Normal breath  sounds bilaterally, no wheezing, rales,rhonchi or crepitation. No use of accessory muscles of respiration.  CARDIOVASCULAR: S1, S2 normal. No murmurs, rubs, or gallops.  ABDOMEN: Soft, nontender, nondistended. Bowel sounds present. No organomegaly or mass.  EXTREMITIES: No pedal edema, cyanosis, or clubbing.  Left AKA. NEUROLOGIC: Cranial nerves II through XII are intact. Muscle strength 5/5 in all extremities. Sensation intact. Gait not checked.  PSYCHIATRIC: The patient is alert and oriented x 3.  SKIN: No obvious rash, lesion, or ulcer.   LABORATORY PANEL:   CBC Recent Labs  Lab 07/02/18 1003  WBC 12.1*  HGB 11.4*  HCT 36.2  PLT 306   ------------------------------------------------------------------------------------------------------------------  Chemistries  Recent Labs  Lab 07/02/18 1003  NA 139  K 3.6  CL 107  CO2 25  GLUCOSE 117*  BUN 25*  CREATININE 1.91*  CALCIUM 8.2*   ------------------------------------------------------------------------------------------------------------------  Cardiac Enzymes Recent Labs  Lab 07/02/18 1003  TROPONINI <0.03   ------------------------------------------------------------------------------------------------------------------  RADIOLOGY:  Dg Chest 2 View  Result Date: 07/02/2018 CLINICAL DATA:  83 year old female with a history of sore throat EXAM: CHEST - 2 VIEW COMPARISON:  02/06/2018, CT 02/13/2018, CT chest 05/26/2018 FINDINGS: Cardiomediastinal silhouette unchanged in size and contour. No central vascular congestion. No interlobular septal thickening. Low lung volumes. Coarsened interstitial markings persist. Mixed nodular and airspace opacity at the right lung base, in the distribution of nodularity on prior CT. IMPRESSION: Low lung volumes with reticulonodular opacity at the right lung base, potentially bronchiolitis/pneumonia versus atelectasis and scarring. Electronically Signed   By: Corrie Mckusick D.O.   On:  07/02/2018 08:41      IMPRESSION AND PLAN:   Acute respiratory failure with hypoxia due to pneumonia. The patient will be admitted to medical floor. Continue oxygen by nasal cannula, DuoNeb as needed.  Robitussin as needed.  Pneumonia, CAP. Continue with doxycycline, follow-up CBC and blood cultures.  Since the patient has a prolonged QT, cannot give Zithromax or Levaquin.  Dehydration and CKD 3.  IV fluid support.  Hold Lasix.  Hypertension.  Hold hypertension medication due to low side blood pressure.  Diabetes.  Start sliding scale. All the records are reviewed and case discussed with ED provider. Management plans discussed  with the patient, her daughter-in-law and they are in agreement.  CODE STATUS: Full code.  TOTAL TIME TAKING CARE OF THIS PATIENT: 35 minutes.    Demetrios Loll M.D on 07/02/2018 at 11:26 AM  Between 7am to 6pm - Pager - [phone]  After 6pm go to [email or web address] - Proofreader  [facility]  Office  [phone]  CC: Primary care physician; Arnetha Courser, MD   Note: This dictation was prepared with Dragon dictation along with smaller phrase technology. Any transcriptional errors that result from this process are unin

## 2018-07-02 NOTE — Consult Note (Signed)
Hematology/Oncology Consult note [facility] Telephone:([phone] [phone]  Patient Care Team: Arnetha Courser, MD as PCP - General (Family Medicine) Lequita Asal, MD as Referring Physician (Hematology and Oncology) Lang Snow, NP as Nurse Practitioner (Neurology) Birder Robson, MD as Referring Physician (Ophthalmology) Lucky Cowboy Erskine Squibb, MD as Referring Physician (Vascular Surgery) Dagoberto Ligas, MD as Referring Physician (Pain Medicine) Lavonia Dana, MD as Consulting Physician (Nephrology) Virgel Manifold, MD as Consulting Physician (Gastroenterology) Yolonda Kida, MD as Consulting Physician (Cardiology)   Name of the patient: Amy [name]  [id number]  Apr 11, 1936   Date of visit: 07/02/18 REASON FOR COSULTATION:  Renal cancer History of presenting illness-  83 y.o. female with history of stage III renal carcinoma follows up with Dr. Mike Gip presented to emergency room for evaluation of sore throat and cough.  Also noticed some body aches.  Denies any nasal congestion, postnasal drip. Cough is dry.  Denies any chest pain, nausea vomiting, diarrhea.  No fever or chills. Chest x-ray in the emergency room showed low lung volume with reticulonodular opacities at the right lung base, essentially bronchiolitis/pneumonia.  She was also noted to have an pulse ox of 89% in ED. Patient supposed to have a PET scan done next week for further evaluation of the etiology of growing left upper lobe 12 x 8 mm nodule in the setting of history of stage III renal cell carcinoma.  Review of Systems  Constitutional: Positive for fatigue. Negative for appetite change, chills and fever.  HENT:   Negative for hearing loss and voice change.        Sore throat  Eyes: Negative for eye problems.  Respiratory: Positive for cough. Negative for chest tightness.   Cardiovascular: Negative for chest pain.  Gastrointestinal: Negative for  abdominal distention, abdominal pain and blood in stool.  Endocrine: Negative for hot flashes.  Genitourinary: Negative for difficulty urinating and frequency.   Musculoskeletal: Negative for arthralgias.  Skin: Negative for itching and rash.  Neurological: Negative for extremity weakness.  Hematological: Negative for adenopathy.  Psychiatric/Behavioral: Negative for confusion.    Allergies  Allergen Reactions  . Fentanyl Other (See Comments)    urine retention  . Penicillins Itching, Rash and Other (See Comments)    Has patient had a PCN reaction causing immediate rash, facial/tongue/throat swelling, SOB or lightheadedness with hypotension: Yes Has patient had a PCN reaction causing severe rash involving mucus membranes or skin necrosis: No Has patient had a PCN reaction that required hospitalization: No Has patient had a PCN reaction occurring within the last 10 years: Yes If all of the above answers are "NO", then may proceed with Cephalosporin use.    Patient Active Problem List   Diagnosis Date Noted  . Pneumonia 07/02/2018  . Lung nodule 04/23/2018  . Hypertensive renal disease 11/08/2017  . Secondary hyperparathyroidism of renal origin ([place]) 11/08/2017  . QT prolongation 10/27/2017  . Osteopenia 10/05/2017  . Choledocholithiasis 08/13/2017  . Chronic pain syndrome 04/05/2017  . Depression 11/15/2016  . Iron deficiency anemia 09/08/2016  . Anemia 06/29/2016  . Late onset Alzheimer's disease with behavioral disturbance ([place]) 06/14/2016  . S/P AKA (above knee amputation) unilateral, left ([place]) 06/08/2016  . Wheelchair dependent 03/11/2016  . 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 S/p nephrectomy 01/29/2015  . Renal neoplasm 01/15/2015  . Abnormal EKG 01/02/2015  . Renal mass 12/05/2014  . GERD (gastroesophageal reflux disease) 11/20/2014  . GAD (generalized anxiety disorder)  11/20/2014  . AB (asthmatic bronchitis) 11/20/2014  . Aortic  atherosclerosis ([place]) 11/20/2014  . Cataract 11/20/2014  . Leg pain 11/20/2014  . Continuous opioid dependence ([place]) 11/20/2014  . CKD (chronic kidney disease) stage 4, GFR 15-29 ml/min (HCC) 11/20/2014  . COPD, mild ([place]) 11/20/2014  . Excessive falling 11/20/2014  . Fatty infiltration of liver 11/20/2014  . Hyperlipidemia 11/20/2014  . Anterior knee pain 11/20/2014  . LBP (low back pain) 11/20/2014  . Malaise and fatigue 11/20/2014  . Abnormal presence of protein in urine 11/20/2014  . PVD (peripheral vascular disease) ([place]) 11/20/2014  . Allergic rhinitis 11/20/2014  . At risk for falling 11/20/2014  . Phantom limb syndrome with pain ([place]) 11/05/2014  . Anxiety 11/05/2014  . Elevated serum creatinine 11/05/2014  . Diabetes mellitus type 2, controlled ([place]) 11/05/2014  . H/O adenomatous polyp of colon 07/27/2010     Past Medical History:  Diagnosis Date  . Anemia    low iron  . Anxiety   . Arthritis   . Bilateral cataracts   . Bronchitis    hx of   . Cataract   . Chronic kidney disease   . Depression   . Diabetes mellitus without complication ([place])   . 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 GERD (gastroesophageal reflux disease)   . Headache   . History of frequent urinary tract infections   . Hx of renal cell cancer    LEFT  . Hyperlipidemia   . Hypertension   . Numbness and tingling    right hand   . Obesity   . Osteopenia 10/05/2017  . Peripheral artery disease ([place])   . Urinary frequency   . Urinary incontinence      Past Surgical History:  Procedure Laterality Date  . ABDOMINAL HYSTERECTOMY  1978  . CARPAL TUNNEL RELEASE Bilateral   . CHOLECYSTECTOMY    . COLONOSCOPY    . COLONOSCOPY WITH ESOPHAGOGASTRODUODENOSCOPY (EGD)    . ERCP N/A 08/19/2017   Procedure: ENDOSCOPIC RETROGRADE CHOLANGIOPANCREATOGRAPHY (ERCP);  Surgeon: Arta Silence, MD;  Location: [facility] END[facility]COPY;  Service: Endoscopy;  Laterality: N/A;   . LEG AMPUTATION     above the knee / left   . ROBOT ASSISTED LAPAROSCOPIC NEPHRECTOMY Left 01/15/2015   Procedure: ROBOTIC ASSISTED LAPAROSCOPIC RADICAL NEPHRECTOMY;  Surgeon: Alexis Frock, MD;  Location: [facility] ORS;  Service: Urology;  Laterality: Left;  . stent placement right leg       Social History   Socioeconomic History  . Marital status: Married    Spouse name: [name]  . Number of children: 1  . Years of education: Not on file  . Highest education level: 10th grade  Occupational History  . Occupation: Retired  Scientific laboratory technician  . Financial resource strain: Not hard at all  . Food insecurity:    Worry: Never true    Inability: Never true  . Transportation needs:    Medical: No    Non-medical: No  Tobacco Use  . Smoking status: Former Smoker    Packs/day: 1.50    Years: 51.00    Pack years: 76.50    Types: Cigarettes    Last attempt to quit: 05/24/2004    Years since quitting: 14.1  . Smokeless tobacco: Never Used  . Tobacco comment: smoking cessation materials not required  Substance and Sexual Activity  . Alcohol use: Not Currently  . Drug use: No  . Sexual activity:  Not Currently  Lifestyle  . Physical activity:    Days per week: 0 days    Minutes per session: 0 min  . Stress: Very much  Relationships  . Social connections:    Talks on phone: Patient refused    Gets together: Patient refused    Attends religious service: Patient refused    Active member of club or organization: Patient refused    Attends meetings of clubs or organizations: Patient refused    Relationship status: Married  . Intimate partner violence:    Fear of current or ex partner: No    Emotionally abused: No    Physically abused: No    Forced sexual activity: No  Other Topics Concern  . Not on file  Social History Narrative  . Not on file     Family History  Problem Relation Age of Onset  . Cancer Mother        Stomach  . Cancer Father   . Diabetes Brother   . Cancer Brother         oral  . Healthy Son   . Healthy Sister      Current Facility-Administered Medications:  .  0.9 %  sodium chloride infusion, , Intravenous, Continuous, Demetrios Loll, MD, Last Rate: 75 mL/hr at 07/02/18 1328 .  acetaminophen (TYLENOL) tablet 650 mg, 650 mg, Oral, Q6H PRN **OR** acetaminophen (TYLENOL) suppository 650 mg, 650 mg, Rectal, Q6H PRN, Demetrios Loll, MD .  atorvastatin (LIPITOR) tablet 20 mg, 20 mg, Oral, QHS, Demetrios Loll, MD .  bisacodyl (DULCOLAX) EC tablet 5 mg, 5 mg, Oral, Daily PRN, Demetrios Loll, MD .  busPIRone (BUSPAR) tablet 10 mg, 10 mg, Oral, TID, Demetrios Loll, MD .  [name] ON 07/03/2018] clopidogrel (PLAVIX) tablet 75 mg, 75 mg, Oral, Daily, Demetrios Loll, MD .  doxycycline (VIBRAMYCIN) 100 mg in sodium chloride 0.9 % 250 mL IVPB, 100 mg, Intravenous, Q12H, Demetrios Loll, MD .  gabapentin (NEURONTIN) capsule 300 mg, 300 mg, Oral, TID, Demetrios Loll, MD .  guaiFENesin-dextromethorphan (ROBITUSSIN DM) 100-10 MG/5ML syrup 5 mL, 5 mL, Oral, Q4H PRN, Demetrios Loll, MD .  heparin injection 5,000 Units, 5,000 Units, Subcutaneous, Q8H, Demetrios Loll, MD, 5,000 Units at 07/02/18 1417 .  HYDROcodone-acetaminophen (NORCO/VICODIN) 5-325 MG per tablet 1-2 tablet, 1-2 tablet, Oral, Q4H PRN, Demetrios Loll, MD .  insulin aspart (novoLOG) injection 0-5 Units, 0-5 Units, Subcutaneous, QHS, Demetrios Loll, MD .  insulin aspart (novoLOG) injection 0-9 Units, 0-9 Units, Subcutaneous, TID WC, Demetrios Loll, MD .  senna-docusate (Senokot-S) tablet 1 tablet, 1 tablet, Oral, QHS PRN, Demetrios Loll, MD   Physical exam:   Vitals:   07/02/18 0945 07/02/18 1100 07/02/18 1145 07/02/18 1226  BP: 129/62 113/67 110/87 101/65  Pulse: 69 75 75 81  Resp: 16 16 17 20   Temp:    (!) 97.5 F (36.4 C)  TempSrc:    Oral  SpO2: 94% 94% 94% 96%  Weight:      Height:       Physical Exam  Constitutional: She is oriented to person, place, and time. No distress.  HENT:  Head: Normocephalic and atraumatic.  Mouth/Throat: No  oropharyngeal exudate.  Eyes: Pupils are equal, round, and reactive to light. EOM are normal. No scleral icterus.  Neck: Normal range of motion. Neck supple.  Cardiovascular: Normal rate and regular rhythm.  No murmur heard. Pulmonary/Chest: Effort normal. No respiratory distress. She has no rales. She exhibits no tenderness.  Decreased breath sound bilaterally.  Breathing  comfortably via nasal cannula oxygen.  Abdominal: Soft. She exhibits no distension. There is no abdominal tenderness.  Musculoskeletal: Normal range of motion.        General: No edema.     Comments: Left lower extremity history of amputation.  Neurological: She is alert and oriented to person, place, and time. No cranial nerve deficit. She exhibits normal muscle tone. Coordination normal.  Skin: Skin is warm and dry. She is not diaphoretic. No erythema.  Psychiatric: Affect normal.        CMP Latest Ref Rng & Units 07/02/2018  Glucose 70 - 99 mg/dL 117(H)  BUN 8 - 23 mg/dL 25(H)  Creatinine 0.44 - 1.00 mg/dL 1.91(H)  Sodium 135 - 145 mmol/L 139  Potassium 3.5 - 5.1 mmol/L 3.6  Chloride 98 - 111 mmol/L 107  CO2 22 - 32 mmol/L 25  Calcium 8.9 - 10.3 mg/dL 8.2(L)  Total Protein 6.5 - 8.1 g/dL -  Total Bilirubin 0.3 - 1.2 mg/dL -  Alkaline Phos 38 - 126 U/L -  AST 15 - 41 U/L -  ALT 0 - 44 U/L -   CBC Latest Ref Rng & Units 07/02/2018  WBC 4.0 - 10.5 K/uL 12.1(H)  Hemoglobin 12.0 - 15.0 g/dL 11.4(L)  Hematocrit 36.0 - 46.0 % 36.2  Platelets 150 - 400 K/uL 306   RADIOGRAPHIC STUDIES: I have personally reviewed the radiological images as listed and agreed with the findings in the report. Dg Chest 2 View  Result Date: 07/02/2018 CLINICAL DATA:  83 year old female with a history of sore throat EXAM: CHEST - 2 VIEW COMPARISON:  02/06/2018, CT 02/13/2018, CT chest 05/26/2018 FINDINGS: Cardiomediastinal silhouette unchanged in size and contour. No central vascular congestion. No interlobular septal thickening. Low  lung volumes. Coarsened interstitial markings persist. Mixed nodular and airspace opacity at the right lung base, in the distribution of nodularity on prior CT. IMPRESSION: Low lung volumes with reticulonodular opacity at the right lung base, potentially bronchiolitis/pneumonia versus atelectasis and scarring. Electronically Signed   By: Corrie Mckusick D.O.   On: 07/02/2018 08:41   Vas [place] Burnard Bunting With/wo Tbi  Result Date: 06/30/2018 LOWER EXTREMITY DOPPLER STUDY Indications: Peripheral artery disease.  Vascular Interventions: Lt AKA, Bilat CIA stents 2007. Comparison Study: 12/23/2017 Performing Technologist: Concha Norway RVT  Examination Guidelines: A complete evaluation includes at minimum, Doppler waveform signals and systolic blood pressure reading at the level of bilateral brachial, anterior tibial, and posterior tibial arteries, when vessel segments are accessible. Bilateral testing is considered an integral part of a complete examination. Photoelectric Plethysmograph (PPG) waveforms and toe systolic pressure readings are included as required and additional duplex testing as needed. Limited examinations for reoccurring indications may be performed as noted.  ABI Findings: +---------+------------------+-----+----------+--------+ Right    Rt Pressure (mmHg)IndexWaveform  Comment  +---------+------------------+-----+----------+--------+ Brachial 96                                        +---------+------------------+-----+----------+--------+ ATA      75                0.78 monophasic         +---------+------------------+-----+----------+--------+ PTA      82                0.85 monophasic         +---------+------------------+-----+----------+--------+ Juanetta Snow  0.53 Abnormal           +---------+------------------+-----+----------+--------+ Right ABIs appear increased compared to prior study on 12/23/2017. Right TBIs appear essentially unchanged compared to prior study  on 12/23/2017.  Summary: Right: Resting right ankle-brachial index indicates moderate right lower extremity arterial disease. The right toe-brachial index is abnormal. Left: Left AKA.  *See table(s) above for measurements and observations.  Electronically signed by Leotis Pain MD on 06/30/2018 at 2:35:01 PM.   Final    Vas [place] Lower Extremity Arterial Duplex  Result Date: 06/30/2018 LOWER EXTREMITY ARTERIAL DUPLEX STUDY Indications: Peripheral artery disease.  Vascular Interventions: Lt AKA, Bilat CIA stents 2007. Current ABI:            rt = .78 Comparison Study: none Performing Technologist: Concha Norway RVT  Examination Guidelines: A complete evaluation includes B-mode imaging, spectral Doppler, color Doppler, and power Doppler as needed of all accessible portions of each vessel. Bilateral testing is considered an integral part of a complete examination. Limited examinations for reoccurring indications may be performed as noted.  Right Duplex Findings: +----------+--------+-----+--------+----------+--------+           PSV cm/sRatioStenosisWaveform  Comments +----------+--------+-----+--------+----------+--------+ CFA Mid   65                   monophasic         +----------+--------+-----+--------+----------+--------+ DFA       73                   monophasic         +----------+--------+-----+--------+----------+--------+ SFA Prox               occluded                   +----------+--------+-----+--------+----------+--------+ SFA Mid                occluded                   +----------+--------+-----+--------+----------+--------+ SFA Distal             occluded                   +----------+--------+-----+--------+----------+--------+ POP Prox  16                   monophasic         +----------+--------+-----+--------+----------+--------+ ATA Distal21                   monophasic         +----------+--------+-----+--------+----------+--------+ PTA Distal10                    monophasic         +----------+--------+-----+--------+----------+--------+  Left Duplex Findings: Left AKA.  Summary: Right: Right CFA and deep profunda are patent. Significant flow seen throughout the deep profunda to mid thigh. SFA is occluded from origin. Popliteal artery is patent and narrowed with moderate atherosclerosis and monophasic flow. Distal PTA and ATA are  patent with low monophasic flow.  See table(s) above for measurements and observations. Electronically signed by Leotis Pain MD on 06/30/2018 at 2:35:04 PM.    Final     Assessment and plan- Patient is a 83 y.o. female with medical history of hypertension, diabetes, anemia, arthritis, bronchitis, CKD, stage III renal cell carcinoma presented to emergency room for evaluation of sore throat and muscle achiness and dry cough.  Chest x-ray indicates possible right side pneumonia.  Patient was also found to be hypoxic with pulse ox of 89% emergency room.  #Community-acquired pneumonia, continue antibiotics with doxycycline.  Follow-up blood and sputum cultures. Influenza screening negative.  Check strep pneumonia urine antigen. Hypoxic, pulse oxygen improved after nasal cannula oxygen.  Discussed with Dr. Bridgett Larsson about low threshold to rule out pulmonary embolism.  Patient has chronic kidney disease, CT angiogram is contraindicated.  If symptoms worsen, recommend obtain V/Q scan.  #History of stage III renal cell carcinoma.  Recent CT scan showed enlarged left upper lobe nodule.  Etiology unknown.  Original plan is to obtain PET scan next week to evaluate hyper metabolic activity. Discussed with patient that PET scan will have to be postponed given acute infection.  Patient and husband appreciate his pronation.   Thank you for allowing me to participate in the care of this patient.  Total face to face encounter time for this patient visit was 70 min. >50% of the time was  spent in counseling and coordination of care.     Earlie Server, MD, PhD Hematology Oncology [facility] at [facility] Pager- [phone] 07/02/2018

## 2018-07-03 ENCOUNTER — Ambulatory Visit: Payer: Medicare HMO | Admitting: Family Medicine

## 2018-07-03 LAB — GLUCOSE, CAPILLARY
Glucose-Capillary: 106 mg/dL — ABNORMAL HIGH (ref 70–99)
Glucose-Capillary: 167 mg/dL — ABNORMAL HIGH (ref 70–99)

## 2018-07-03 MED ORDER — DOXYCYCLINE HYCLATE 100 MG PO CAPS: 100.0000 mg | ORAL_CAPSULE | Freq: Two times a day (BID) | ORAL | 0 refills | Status: AC

## 2018-07-03 NOTE — Discharge Summary (Signed)
[place] [facility] [place] NAME: Amy [name]    MR#:  [id number]  DATE OF BIRTH:  1935-09-26  DATE OF ADMISSION:  07/02/2018 ADMITTING PHYSICIAN: Demetrios Loll, MD  DATE OF DISCHARGE: 07/03/2018  PRIMARY CARE PHYSICIAN: Arnetha Courser, MD    ADMISSION DIAGNOSIS:  Hypoxemia [R09.02] COPD exacerbation (HCC) [J44.1] Pharyngitis, unspecified etiology [J02.9] Community acquired pneumonia of right lower lobe of lung ([place]) [J18.1]  DISCHARGE DIAGNOSIS:  Active Problems:   Community acquired pneumonia of right lower lobe of lung ([place])   Hypoxemia   COPD exacerbation ([place])   Renal cell carcinoma ([place])   SECONDARY DIAGNOSIS:   Past Medical History:  Diagnosis Date  . Anemia    low iron  . Anxiety   . Arthritis   . Bilateral cataracts   . Bronchitis    hx of   . Cataract   . Chronic kidney disease   . Depression   . Diabetes mellitus without complication ([place])   . 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 GERD (gastroesophageal reflux disease)   . Headache   . History of frequent urinary tract infections   . Hx of renal cell cancer    LEFT  . Hyperlipidemia   . Hypertension   . Numbness and tingling    right hand   . Obesity   . Osteopenia 10/05/2017  . Peripheral artery disease ([place])   . Urinary frequency   . Urinary incontinence     HOSPITAL COURSE:   82 year old female with a history of diabetes, left AKA, current chronic kidney disease stage III and Stage III renal cell carcinoma who presented to the hospital with sore throat and muscle aches.  1.  Acute hypoxic respiratory failure due to community-acquired pneumonia: Oxygen has been weaned to room air.  Blood cultures have been negative.   Strep pneumoniae urine antigen is pending  patient was on doxycycline.  She will continue treatment for doxycycline.  2.  History of stage III renal cell carcinoma: Patient will follow-up with  Dr. Mike Gip  3.  Essential hypertension: Continue metoprolol  4.  Diabetes, diet controlled: Continue ADA diet  5.  Mild dementia: Continue Aricept  6.  Hyperlipidemia: Continue statin 7.  History of left leg AKA  DISCHARGE CONDITIONS AND DIET:   Stable for discharge on heart healthy ADA diet  CONSULTS OBTAINED:  Treatment Team:  Earlie Server, MD  DRUG ALLERGIES:   Allergies  Allergen Reactions  . Fentanyl Other (See Comments)    urine retention  . Penicillins Itching, Rash and Other (See Comments)    Has patient had a PCN reaction causing immediate rash, facial/tongue/throat swelling, SOB or lightheadedness with hypotension: Yes Has patient had a PCN reaction causing severe rash involving mucus membranes or skin necrosis: No Has patient had a PCN reaction that required hospitalization: No Has patient had a PCN reaction occurring within the last 10 years: Yes If all of the above answers are "NO", then may proceed with Cephalosporin use.    DISCHARGE MEDICATIONS:   Allergies as of 07/03/2018      Reactions   Fentanyl Other (See Comments)   urine retention   Penicillins Itching, Rash, Other (See Comments)   Has patient had a PCN reaction causing immediate rash, facial/tongue/throat swelling, SOB or lightheadedness with hypotension: Yes Has patient had a PCN reaction causing severe rash involving mucus membranes or skin necrosis:  No Has patient had a PCN reaction that required hospitalization: No Has patient had a PCN reaction occurring within the last 10 years: Yes If all of the above answers are "NO", then may proceed with Cephalosporin use.      Medication List    TAKE these medications   acetaminophen 500 MG tablet Commonly known as:  TYLENOL Take 1,000 mg by mouth every 6 (six) hours as needed for mild pain, fever or headache.   albuterol 108 (90 Base) MCG/ACT inhaler Commonly known as:  PROVENTIL HFA;VENTOLIN HFA Inhale 2 puffs into the lungs every 6 (six) hours  as needed for wheezing or shortness of breath.   atorvastatin 20 MG tablet Commonly known as:  LIPITOR TAKE 1 TABLET (20 MG TOTAL) BY MOUTH AT BEDTIME.   busPIRone 10 MG tablet Commonly known as:  BUSPAR Take 10 mg by mouth 3 (three) times daily.   clopidogrel 75 MG tablet Commonly known as:  PLAVIX Take 1 tablet (75 mg total) by mouth daily.   donepezil 10 MG tablet Commonly known as:  ARICEPT Take 1 tablet (10 mg total) by mouth at bedtime.   doxycycline 100 MG capsule Commonly known as:  VIBRAMYCIN Take 1 capsule (100 mg total) by mouth 2 (two) times daily for 4 days.   famotidine 20 MG tablet Commonly known as:  PEPCID Take 1 tablet (20 mg total) by mouth at bedtime. To help prevent heartburn, reflux   furosemide 20 MG tablet Commonly known as:  LASIX Take 40 mg by mouth daily.   gabapentin 300 MG capsule Commonly known as:  NEURONTIN Take 1 capsule (300 mg total) by mouth 3 (three) times daily.   HYDROcodone-acetaminophen 7.5-325 MG tablet Commonly known as:  NORCO Take 1 tablet by mouth every 8 (eight) hours as needed for up to 30 days. For chronic pain.  To fill on or after: 06-21-2018, 07-21-2018, 08-20-2018 Start taking on:  July 21, 2018   ipratropium-albuterol 0.5-2.5 (3) MG/3ML Soln Commonly known as:  DUONEB INHALE THE CONTENTS OF 1 VIAL VIA NEBULIZER THREE TIMES DAILY AS DIRECTED   metoprolol tartrate 50 MG tablet Commonly known as:  LOPRESSOR Take 1 tablet (50 mg total) by mouth 2 (two) times daily.   pantoprazole 40 MG tablet Commonly known as:  PROTONIX TAKE 1 TABLET EVERY DAY   QUEtiapine 50 MG tablet Commonly known as:  SEROQUEL Take 50 mg by mouth at bedtime.   Zinc Oxide 13 % Crea Commonly known as:  DESITIN RAPID RELIEF Apply 1 application topically 3 (three) times daily as needed.         Today   CHIEF COMPLAINT:   Patient doing well no acute issues.  Denies fever, shortness of breath or chest pain.   VITAL SIGNS:  Blood  pressure 140/90, pulse 95, temperature 97.8 F (36.6 C), resp. rate 17, height 5\' 2"  (1.575 m), weight 73.6 kg, SpO2 99 %.   REVIEW OF SYSTEMS:  Review of Systems  Constitutional: Negative.  Negative for chills, fever and malaise/fatigue.  HENT: Negative.  Negative for ear discharge, ear pain, hearing loss, nosebleeds and sore throat.   Eyes: Negative.  Negative for blurred vision and pain.  Respiratory: Negative.  Negative for cough, hemoptysis, shortness of breath and wheezing.   Cardiovascular: Negative.  Negative for chest pain, palpitations and leg swelling.  Gastrointestinal: Negative.  Negative for abdominal pain, blood in stool, diarrhea, nausea and vomiting.  Genitourinary: Negative.  Negative for dysuria.  Musculoskeletal: Negative.  Negative for  back pain.  Skin: Negative.   Neurological: Negative for dizziness, tremors, speech change, focal weakness, seizures and headaches.  Endo/Heme/Allergies: Negative.  Does not bruise/bleed easily.  Psychiatric/Behavioral: Negative.  Negative for depression, hallucinations and suicidal ideas.     PHYSICAL EXAMINATION:  GENERAL:  83 y.o.-year-old patient lying in the bed with no acute distress.  NECK:  Supple, no jugular venous distention. No thyroid enlargement, no tenderness.  LUNGS: Normal breath sounds bilaterally, no wheezing, rales,rhonchi  No use of accessory muscles of respiration.  CARDIOVASCULAR: S1, S2 normal. No murmurs, rubs, or gallops.  ABDOMEN: Soft, non-tender, non-distended. Bowel sounds present. No organomegaly or mass.  EXTREMITIES: No pedal edema, cyanosis, or clubbing.  Left AKA PSYCHIATRIC: The patient is alert and oriented x 3.  SKIN: No obvious rash, lesion, or ulcer.   DATA REVIEW:   CBC Recent Labs  Lab 07/02/18 1003  WBC 12.1*  HGB 11.4*  HCT 36.2  PLT 306    Chemistries  Recent Labs  Lab 07/02/18 0850 07/02/18 1003  NA  --  139  K  --  3.6  CL  --  107  CO2  --  25  GLUCOSE  --  117*   BUN  --  25*  CREATININE  --  1.91*  CALCIUM  --  8.2*  MG 1.9  --     Cardiac Enzymes Recent Labs  Lab 07/02/18 1003  TROPONINI <0.03    Microbiology Results  @MICRORSLT48 @  RADIOLOGY:  Dg Chest 2 View  Result Date: 07/02/2018 CLINICAL DATA:  83 year old female with a history of sore throat EXAM: CHEST - 2 VIEW COMPARISON:  02/06/2018, CT 02/13/2018, CT chest 05/26/2018 FINDINGS: Cardiomediastinal silhouette unchanged in size and contour. No central vascular congestion. No interlobular septal thickening. Low lung volumes. Coarsened interstitial markings persist. Mixed nodular and airspace opacity at the right lung base, in the distribution of nodularity on prior CT. IMPRESSION: Low lung volumes with reticulonodular opacity at the right lung base, potentially bronchiolitis/pneumonia versus atelectasis and scarring. Electronically Signed   By: Corrie Mckusick D.O.   On: 07/02/2018 08:41      Allergies as of 07/03/2018      Reactions   Fentanyl Other (See Comments)   urine retention   Penicillins Itching, Rash, Other (See Comments)   Has patient had a PCN reaction causing immediate rash, facial/tongue/throat swelling, SOB or lightheadedness with hypotension: Yes Has patient had a PCN reaction causing severe rash involving mucus membranes or skin necrosis: No Has patient had a PCN reaction that required hospitalization: No Has patient had a PCN reaction occurring within the last 10 years: Yes If all of the above answers are "NO", then may proceed with Cephalosporin use.      Medication List    TAKE these medications   acetaminophen 500 MG tablet Commonly known as:  TYLENOL Take 1,000 mg by mouth every 6 (six) hours as needed for mild pain, fever or headache.   albuterol 108 (90 Base) MCG/ACT inhaler Commonly known as:  PROVENTIL HFA;VENTOLIN HFA Inhale 2 puffs into the lungs every 6 (six) hours as needed for wheezing or shortness of breath.   atorvastatin 20 MG  tablet Commonly known as:  LIPITOR TAKE 1 TABLET (20 MG TOTAL) BY MOUTH AT BEDTIME.   busPIRone 10 MG tablet Commonly known as:  BUSPAR Take 10 mg by mouth 3 (three) times daily.   clopidogrel 75 MG tablet Commonly known as:  PLAVIX Take 1 tablet (75 mg total) by mouth  daily.   donepezil 10 MG tablet Commonly known as:  ARICEPT Take 1 tablet (10 mg total) by mouth at bedtime.   doxycycline 100 MG capsule Commonly known as:  VIBRAMYCIN Take 1 capsule (100 mg total) by mouth 2 (two) times daily for 4 days.   famotidine 20 MG tablet Commonly known as:  PEPCID Take 1 tablet (20 mg total) by mouth at bedtime. To help prevent heartburn, reflux   furosemide 20 MG tablet Commonly known as:  LASIX Take 40 mg by mouth daily.   gabapentin 300 MG capsule Commonly known as:  NEURONTIN Take 1 capsule (300 mg total) by mouth 3 (three) times daily.   HYDROcodone-acetaminophen 7.5-325 MG tablet Commonly known as:  NORCO Take 1 tablet by mouth every 8 (eight) hours as needed for up to 30 days. For chronic pain.  To fill on or after: 06-21-2018, 07-21-2018, 08-20-2018 Start taking on:  July 21, 2018   ipratropium-albuterol 0.5-2.5 (3) MG/3ML Soln Commonly known as:  DUONEB INHALE THE CONTENTS OF 1 VIAL VIA NEBULIZER THREE TIMES DAILY AS DIRECTED   metoprolol tartrate 50 MG tablet Commonly known as:  LOPRESSOR Take 1 tablet (50 mg total) by mouth 2 (two) times daily.   pantoprazole 40 MG tablet Commonly known as:  PROTONIX TAKE 1 TABLET EVERY DAY   QUEtiapine 50 MG tablet Commonly known as:  SEROQUEL Take 50 mg by mouth at bedtime.   Zinc Oxide 13 % Crea Commonly known as:  DESITIN RAPID RELIEF Apply 1 application topically 3 (three) times daily as needed.          Management plans discussed with the patient and she is in agreement. Stable for discharge   Patient should follow up with pcp  CODE STATUS:     Code Status Orders  (From admission, onward)          Start     Ordered   07/02/18 1225  Full code  Continuous     07/02/18 1224        Code Status History    Date Active Date Inactive Code Status Order ID Comments User Context   08/13/2017 0332 08/15/2017 1824 Full Code [id number]  Lance Coon, MD Inpatient   01/15/2015 2127 01/17/2015 1541 Full Code [id number]  Star Age, MD Inpatient    Advance Directive Documentation     Most Recent Value  Type of Advance Directive  Healthcare Power of [place] [per husband report]  Pre-existing out of facility DNR order (yellow form or pink MOST form)  -  "MOST" Form in Place?  -      TOTAL TIME TAKING CARE OF THIS PATIENT: 38 minutes.    Note: This dictation was prepared with Dragon dictation along with smaller phrase technology. Any transcriptional errors that result from this process are unintentional.  Sharece Fleischhacker M.D on 07/03/2018 at 10:12 AM  Between 7am to 6pm - Pager - [phone] After 6pm go to [email or web address] - password [email or web address] [place] [facility]  Office  [phone]  CC: Primary care physician; Arnetha Courser, MD

## 2018-07-03 NOTE — Care Management Note (Addendum)
Case Management Note  Patient Details  Name: VERENA SHAWGO MRN: [id number] Date of Birth: [birth year]  Subjective/Objective:                   Patient is from home with husband. She is wheelchair dependent. She will need EMS to home.  She is not longer requiring Supplement O2.  PCP Dr Sanda Klein. He does not qualify for Medicaid. [facility]. Husband declined EMS to home although he has had hernia.  He has hired caregivers 8-1P, 5-10P.    Action/Plan: Home health agency list provided via [email or web address]. Referral to [facility] with [facility].  Expected Discharge Date:  07/03/18               Expected Discharge Plan:     In-House Referral:     Discharge planning Services  CM Consult  Post Acute Care Choice:  Home Health Choice offered to:  Patient, Spouse  DME Arranged:    DME Agency:     HH Arranged:    [place] Agency:     Status of Service:  Completed, signed off  If discussed at [employer] of Stay Meetings, dates discussed:    Additional Comments:  Marshell Garfinkel, RN 07/03/2018, 12:34 PM

## 2018-07-03 NOTE — Progress Notes (Signed)
Dr Marcille Blanco notified patient refusing blood draws, vs to be taken, acknowledged, no new orders.

## 2018-07-04 ENCOUNTER — Encounter: Payer: Medicare HMO | Admitting: Student in an Organized Health Care Education/Training Program

## 2018-07-04 ENCOUNTER — Telehealth: Payer: Self-pay

## 2018-07-04 ENCOUNTER — Ambulatory Visit: Payer: Medicare HMO | Admitting: Internal Medicine

## 2018-07-04 ENCOUNTER — Telehealth: Payer: Self-pay | Admitting: Family Medicine

## 2018-07-04 DIAGNOSIS — C642 Malignant neoplasm of left kidney, except renal pelvis: Secondary | ICD-10-CM | POA: Diagnosis not present

## 2018-07-04 DIAGNOSIS — N183 Chronic kidney disease, stage 3 (moderate): Secondary | ICD-10-CM | POA: Diagnosis not present

## 2018-07-04 DIAGNOSIS — J440 Chronic obstructive pulmonary disease with acute lower respiratory infection: Secondary | ICD-10-CM | POA: Diagnosis not present

## 2018-07-04 DIAGNOSIS — E1122 Type 2 diabetes mellitus with diabetic chronic kidney disease: Secondary | ICD-10-CM | POA: Diagnosis not present

## 2018-07-04 DIAGNOSIS — F0390 Unspecified dementia without behavioral disturbance: Secondary | ICD-10-CM | POA: Diagnosis not present

## 2018-07-04 DIAGNOSIS — J181 Lobar pneumonia, unspecified organism: Secondary | ICD-10-CM | POA: Diagnosis not present

## 2018-07-04 DIAGNOSIS — J441 Chronic obstructive pulmonary disease with (acute) exacerbation: Secondary | ICD-10-CM | POA: Diagnosis not present

## 2018-07-04 DIAGNOSIS — I129 Hypertensive chronic kidney disease with stage 1 through stage 4 chronic kidney disease, or unspecified chronic kidney disease: Secondary | ICD-10-CM | POA: Diagnosis not present

## 2018-07-04 DIAGNOSIS — J029 Acute pharyngitis, unspecified: Secondary | ICD-10-CM | POA: Diagnosis not present

## 2018-07-04 DIAGNOSIS — Z748 Other problems related to care provider dependency: Secondary | ICD-10-CM

## 2018-07-04 NOTE — Telephone Encounter (Signed)
Transition Care Management Follow-up Telephone Call  Date of discharge and from where: 07/03/18 [facility]  How have you been since you were released from the hospital? Pt states she is doing a little better  Any questions or concerns? No   Items Reviewed:  Did the pt receive and understand the discharge instructions provided? Yes   Medications obtained and verified? Yes   Any new allergies since your discharge? No   Dietary orders reviewed? Yes  Do you have support at home? Yes   Functional Questionnaire: (I = Independent and D = Dependent) ADLs: D  Bathing/Dressing- D  Meal Prep- D  Eating- I  Maintaining continence- I  Transferring/Ambulation- Independent with assistance  Managing Meds- D  Follow up appointments reviewed:   PCP Hospital f/u appt confirmed? Yes  Scheduled to see Dr. Sanda Klein on 07/07/18 @ 1:40.   Are transportation arrangements needed? No   If their condition worsens, is the pt aware to call PCP or go to the Emergency Dept.? Yes  Was the patient provided with contact information for the PCP's office or ED? Yes  Was to pt encouraged to call back with questions or concerns? Yes  Spoke to patient and her husband Amy [name]. He will be having hernia surgery on 07/13/18 and concerned about future transportation for her and would like additional resources if available. Will send referral to C3 team to contact.

## 2018-07-04 NOTE — Telephone Encounter (Signed)
Copied from [place] [id number]. Topic: Quick Communication - Home Health Verbal Orders >> Jul 04, 2018  3:53 PM Bea Graff, NT wrote: Caller/Agency: [place] Number: [phone] Requesting OT/PT/Skilled Nursing/Social Work: Nursing Frequency: 1 time a week for 1 week, 2 times a week for 1 week, 1 time a week for 2 week, and once every other week for 4 weeks

## 2018-07-05 NOTE — Telephone Encounter (Signed)
Appt is Feb 14th  Okay for orders

## 2018-07-05 NOTE — Telephone Encounter (Signed)
M[place] notified.

## 2018-07-06 ENCOUNTER — Telehealth: Payer: Self-pay

## 2018-07-06 NOTE — Telephone Encounter (Signed)
Copied from [place] [phone]. Topic: General - Other >> Jul 06, 2018  8:39 AM Leward Quan A wrote: Reason for CRM: Cherly Anderson with [facility] called to inform Dr Sanda Klein that patients PT evaluation will be delayed until Monday 07/10/2018 due to other appointment conflicts any questions please call  Ph# [phone]

## 2018-07-07 ENCOUNTER — Inpatient Hospital Stay: Payer: Medicare HMO | Admitting: Family Medicine

## 2018-07-07 LAB — CULTURE, BLOOD (SINGLE)
Culture: NO GROWTH
Culture: NO GROWTH
Special Requests: ADEQUATE

## 2018-07-10 ENCOUNTER — Telehealth: Payer: Self-pay | Admitting: Family Medicine

## 2018-07-10 DIAGNOSIS — J181 Lobar pneumonia, unspecified organism: Secondary | ICD-10-CM | POA: Diagnosis not present

## 2018-07-10 DIAGNOSIS — Z89612 Acquired absence of left leg above knee: Secondary | ICD-10-CM | POA: Diagnosis not present

## 2018-07-10 DIAGNOSIS — J189 Pneumonia, unspecified organism: Secondary | ICD-10-CM

## 2018-07-10 DIAGNOSIS — C642 Malignant neoplasm of left kidney, except renal pelvis: Secondary | ICD-10-CM | POA: Diagnosis not present

## 2018-07-10 DIAGNOSIS — S31125A Laceration of abdominal wall with foreign body, periumbilic region without penetration into peritoneal cavity, initial encounter: Secondary | ICD-10-CM | POA: Diagnosis not present

## 2018-07-10 DIAGNOSIS — J440 Chronic obstructive pulmonary disease with acute lower respiratory infection: Secondary | ICD-10-CM | POA: Diagnosis not present

## 2018-07-10 DIAGNOSIS — J029 Acute pharyngitis, unspecified: Secondary | ICD-10-CM | POA: Diagnosis not present

## 2018-07-10 DIAGNOSIS — J441 Chronic obstructive pulmonary disease with (acute) exacerbation: Secondary | ICD-10-CM | POA: Diagnosis not present

## 2018-07-10 DIAGNOSIS — R0902 Hypoxemia: Secondary | ICD-10-CM

## 2018-07-10 DIAGNOSIS — I129 Hypertensive chronic kidney disease with stage 1 through stage 4 chronic kidney disease, or unspecified chronic kidney disease: Secondary | ICD-10-CM | POA: Diagnosis not present

## 2018-07-10 DIAGNOSIS — J42 Unspecified chronic bronchitis: Secondary | ICD-10-CM | POA: Diagnosis not present

## 2018-07-10 DIAGNOSIS — E1122 Type 2 diabetes mellitus with diabetic chronic kidney disease: Secondary | ICD-10-CM | POA: Diagnosis not present

## 2018-07-10 DIAGNOSIS — N183 Chronic kidney disease, stage 3 (moderate): Secondary | ICD-10-CM | POA: Diagnosis not present

## 2018-07-10 DIAGNOSIS — F0390 Unspecified dementia without behavioral disturbance: Secondary | ICD-10-CM | POA: Diagnosis not present

## 2018-07-10 NOTE — Telephone Encounter (Signed)
° °  Amy [name] received the message but said she did not get a message saying the PT orders were ok please call   [phone]

## 2018-07-10 NOTE — Telephone Encounter (Signed)
Left detailed VM with Cherly Anderson.

## 2018-07-10 NOTE — Telephone Encounter (Signed)
Sounds like a mucous plug perhaps; as long as she is running over 90%, no need for supplemental oxygen If symptomatic, sats drop, call back or have her go to urgent care or ER Have her get f/u CXR prior to her appointment here

## 2018-07-10 NOTE — Telephone Encounter (Signed)
Copied from [place] [phone]. Topic: Quick Communication - Home Health Verbal Orders >> Jul 10, 2018 11:23 AM Alanda Slim E wrote: Caller/Agency: [place] Number: [phone] (secure VM) Requesting PT Home health  Frequency: 2x a week 2 weeks  And 1x a week for 2 weeks   FYI: When Raquel Sarna was at Pt home last there was Crackling sound in upper lung area and O2 was 82%. After visit Pt used her nebulizer and after using the nebulizer she was running 95-96% and lungs sounded clear

## 2018-07-11 ENCOUNTER — Other Ambulatory Visit: Payer: Self-pay | Admitting: *Deleted

## 2018-07-11 NOTE — Telephone Encounter (Signed)
Raquel Sarna called and said she received the voicemail but it did not say if she could see her for physical therapy. Please call Raquel Sarna @ [phone]

## 2018-07-11 NOTE — Patient Outreach (Signed)
[facility]) Care Management  07/11/2018  Amy [name] 06-Oct-1935 [id number]   EMMI-general discharge- [place] Day # 4  Date: 12/15/20200 Saturday 1011  Red Alert Reason: Sad/hopeless/anxious/empty?Yes Insurance:  [employer] inpatient referral  [facility] admissions  ED visits in the last 6 months   Transition of care services noted to be completed by primary care MD office staff    Outreach attempt # 1 successful to the home number  Patient's husband Amy Amy [name] is able to verify HIPAA as Amy [name] has dementia She was noted to scream out inappropriately x 1 during the call [place] Management RN reviewed and addressed red alert with Amy [name]  EMMI Amy [name] reports Amy [name] has been depressed since the loss leg in ? 2007 He reports she also was dx with dementia and has been on various medications for depression and dementia. He reports the answer to the EMMI question was correct He reports that Amy [name] is still on medication and was also evaluated on 07/10/2018 by [facility] SW. He is unable to remember the name of the SW. He reports the SW informed him he would send her notes and evaluation to Amy [name] as the pt is scheduled to be seen by Amy [name] on 07/12/2018    During this call Amy [name] informed CM that he is scheduled to have hernia surgery on Thursday 07/13/2018. He reports the [facility] SW offered him a number for project [facility] for dementia caregiver services He report he called and a left message this am about 0930 and is pending a return call. He reports he is already paying  for 2 sitters (not with an agency-$10 /hr) out of his pocket at this time but has not inquire if they could assist while he recuperates from surgery. He reports the [facility] SW, Amy [name], states he is not qualifying for other DSS services at this time.  He was interested in a response for assistance or a referral to [place] discussed first attempting to  find out about the [facility] SW's interventions prior to making a [facility] SW referral He voiced understanding. CM inquired if his sons, family or church members could assist and he stated he doubts it.    [facility] RN CM called [facility] and left a message with Colletta Maryland to have the [facility] SW to return a call to CM. Colletta Maryland reports the SW is [facility] SW about possible resources or other possible options. No other options available except what has already been offered by [facility] SW except to make sure Amy [name] has a list of personal care agency contact numbers  [facility] RN CM returned a call to Amy [name] He is able to verify HIPAA. CM reviewed the message left for [facility] SW, pending call from [facility] SW and the consult with [facility] SW.  When CM discussed the [facility] SW name Amy [name] reports he may have accidentally left a message for [facility] vs [facility]. CM encouraged him to call project care today. CM also provided him with the following home care agencies as he reports he did not receive a list of agencies. CM also encouraged him again to speak with sons, family and church members considering the surgery is scheduled in 2 days. CM discussed x 3 that there is not an agency that will not charge for care services.  CM discussed follow up as soon as Amy [name] returns a call to CM  or on 07/12/2018. He voiced understanding and appreciation of resources offered   CM provided the following information from [place]: home care providers [phone], [facility] [phone], [facility] [phone], [facility] [phone] and  [facility] [phone]  Social: Amy Bywater lives at home with her 17 year old husband as her primary care giver. She has 3 sons but they are not active in her care. One son lives locally and others in [place] per Amy [name] "They have busy lives" Amy [name] reports Amy [name] is assist/dependent in all care. He reports having to assist with transfers, and other ADLS He reports having to  pay for Amy [name] to be placed in [place] snf in December 2019 after he sustained broken ribs He reports he is using his SSI money to pay for the care of his wife   Conditions: DM type 2 COPD, PVD, allergic rhinitis, hx of PNA, GERD, fatty infiltration of liver, CKD, late onset alzheimer's disease with behavioral disturbance, anxiety cataract, chronic pain syndrome hx renal cancer hx of falls s/ps L AKA    DME nebulizer, oxygen, w/c, shower chair, bathtub lift dentures., cbg meter Medications: denies concerns with taking medications as prescribed, affording medications, side effects of medications and questions about medications    Appointments: had hospital follow up with Amy [name] 1340 07/07/2018 to be seen on 07/12/2018  09/19/2018 1345 Amy [name] Pain management clinic   Advance Directives: POA Amy [name]    Consent: [facility] RN CM reviewed [facility] services with patient. Patient gave verbal consent for services.   Advised patient that there will be further automated EMMI- post discharge calls to assess how the patient is doing following the recent hospitalization Advised the patient that another call may be received from a nurse if any of their responses were abnormal. Patient voiced understanding and was appreciative of f/u call.   Plan: [facility] RN CM will follow up with Amy [name] within 1 business day related to his search for personal care services for the patient  Pending call from [place] sent a successful outreach letter as discussed with [facility] brochure enclosed for review  Routed note to MD     [place]. Amy Hamman, RN, BSN, [place] Coordinator Office number [phone] Mobile number [phone]  Main [facility] number [phone] Fax number [phone]

## 2018-07-12 ENCOUNTER — Other Ambulatory Visit: Payer: Self-pay | Admitting: *Deleted

## 2018-07-12 ENCOUNTER — Ambulatory Visit (INDEPENDENT_AMBULATORY_CARE_PROVIDER_SITE_OTHER): Payer: Medicare HMO | Admitting: Family Medicine

## 2018-07-12 ENCOUNTER — Other Ambulatory Visit: Payer: Self-pay | Admitting: Family Medicine

## 2018-07-12 ENCOUNTER — Encounter: Payer: Self-pay | Admitting: Family Medicine

## 2018-07-12 VITALS — BP 130/76 | HR 71 | Temp 98.2°F | Resp 14 | Ht 62.0 in | Wt 158.8 lb

## 2018-07-12 DIAGNOSIS — E781 Pure hyperglyceridemia: Secondary | ICD-10-CM

## 2018-07-12 DIAGNOSIS — J181 Lobar pneumonia, unspecified organism: Secondary | ICD-10-CM | POA: Diagnosis not present

## 2018-07-12 DIAGNOSIS — J189 Pneumonia, unspecified organism: Secondary | ICD-10-CM

## 2018-07-12 DIAGNOSIS — F339 Major depressive disorder, recurrent, unspecified: Secondary | ICD-10-CM

## 2018-07-12 DIAGNOSIS — K219 Gastro-esophageal reflux disease without esophagitis: Secondary | ICD-10-CM

## 2018-07-12 DIAGNOSIS — Z09 Encounter for follow-up examination after completed treatment for conditions other than malignant neoplasm: Secondary | ICD-10-CM | POA: Diagnosis not present

## 2018-07-12 DIAGNOSIS — N184 Chronic kidney disease, stage 4 (severe): Secondary | ICD-10-CM

## 2018-07-12 DIAGNOSIS — G301 Alzheimer's disease with late onset: Secondary | ICD-10-CM | POA: Diagnosis not present

## 2018-07-12 DIAGNOSIS — Z89612 Acquired absence of left leg above knee: Secondary | ICD-10-CM

## 2018-07-12 DIAGNOSIS — E1122 Type 2 diabetes mellitus with diabetic chronic kidney disease: Secondary | ICD-10-CM

## 2018-07-12 DIAGNOSIS — F0281 Dementia in other diseases classified elsewhere with behavioral disturbance: Secondary | ICD-10-CM

## 2018-07-12 DIAGNOSIS — Z5181 Encounter for therapeutic drug level monitoring: Secondary | ICD-10-CM | POA: Diagnosis not present

## 2018-07-12 DIAGNOSIS — F02818 Dementia in other diseases classified elsewhere, unspecified severity, with other behavioral disturbance: Secondary | ICD-10-CM

## 2018-07-12 NOTE — Patient Instructions (Addendum)
We'll get labs today Please have a chest xray the first week of March Please do be in touch with the social workers about transportation and other care

## 2018-07-12 NOTE — Telephone Encounter (Signed)
Do you approve PT?

## 2018-07-12 NOTE — Patient Outreach (Signed)
[facility]) Care Management  07/12/2018  LEIGHTYN CINA 1935-08-06 [id number]   Care coordination  [facility] left [facility] RN CM a message [facility] RN CM returned the call and left a message for the [facility] SW to include CM's return number  Plans [facility] RN CM will follow up with Mr Barcelo within 1 business day related to his search for personal care services for the patient  Pending call from [place]. Lavina Hamman, RN, BSN, [place] Coordinator Office number [phone] Mobile number [phone]  Main [facility] number [phone] Fax number [phone]

## 2018-07-12 NOTE — Patient Outreach (Signed)
  [facility]) Care Management  07/12/2018  [name] 02-28-1936 [id number]   Care coordination   [facility] RN CM called and spoke with Mr Newill to follow up on the status of [name]'s MD appointment and to update him on call from [facility] SW He wss able to verify HIPAA He reports [name]'s office visit went well and Dr Sanda Klein recommended visits to a psychiatrist for management of [name] depression  CM and Mr Mello discussed psychiatrist visit. He asked about he cost of the visits He was referred to his insurance carrier and the office of the psychiatrist. Cm discussed that generally there is a co pay for hourly visits He voiced understanding   He reports he has been busy today and only placed a call to project care. He has not attempted to call any other personal care agency He reports for 07/13/2018 "all is set up for tomorrow but not sure about Saturday."  CM inquired if he had discussed the caregiver issues with his medical provider and he states he has not. CM encouraged him to have a conversation with the medical provider and his family again He confirms he does not have a long term care policy for him nor [name] RN CM discussed the contact with [facility] and all resources had been provided and left in the folder in the home  Discussed that CM and the [facility] SW discussed the resources offered also by [facility] RN CM   [facility] RN CM discussed case closure with Mr Demauro and he voices appreciation for resources offered  Plans [facility] RN CM will close case at this time as patient has been assessed and no needs identified/needs resolved.   Pt encouraged to return a call to [facility] RN CM prn  Kimberly L. Lavina Hamman, RN, BSN, [place] Coordinator Office number [phone] Mobile number [phone]  Main [facility] number [phone] Fax number [phone]

## 2018-07-12 NOTE — Telephone Encounter (Signed)
Okay for PT

## 2018-07-12 NOTE — Progress Notes (Signed)
BP 130/76   Pulse 71   Temp 98.2 F (36.8 C) (Oral)   Resp 14   Ht 5\' 2"  (1.575 m)   Wt 158 lb 12.8 oz (72 kg)   SpO2 91%   BMI 29.04 kg/m    Subjective:    Patient ID: Amy [name], female    DOB: 1936/04/04, 83 y.o.   MRN: [id number]  HPI: Amy [name] is a 83 y.o. female  Chief Complaint  Patient presents with  . Hospitalization Follow-up    pneumonia    HPI  Patient here for f/u, hospital visit for CAP Her husband is here with her today  Hospitalized for pneumonia, Jul 02, 2018, low oxygen, CAP, COPD exacerbation Discharged on Jul 03, 2018 Finished with antibiotics Right lower lobe pneumonia on CXR Still coughing up a little bit phlegm, not sure about color (told nurse that it was clear) No fevers since leaving the hospital PT and [place] and SW all helping at home Still weak after the hospital  Hx of renal cell carcinoma; she will follow-up with Dr. Mike Gip and getting PET scan soon Flu test in the hospital was negative Glucose readings reviewed Husband is checking FSBS at home, 100 at home since leaving She had elevated BUN, creatinine and reduced GFR in the hospital; treated with IV fluids She was on ranitidine, now on famotidine; not much acid reflux  Depression screen [facility] 2/9 07/12/2018 07/11/2018 03/28/2018 02/15/2018 02/03/2018  Decreased Interest 0 1 0 0 0  Down, Depressed, Hopeless 1 1 0 0 1  PHQ - 2 Score 1 2 0 0 1  Altered sleeping 0 1 - 0 1  Tired, decreased energy 1 0 - 0 0  Change in appetite 0 0 - 0 1  Feeling bad or failure about yourself  0 0 - 0 0  Trouble concentrating 0 1 - 0 1  Moving slowly or fidgety/restless 0 0 - 0 0  Suicidal thoughts 0 0 - 0 0  PHQ-9 Score 2 4 - 0 4  Difficult doing work/chores Not difficult at all Somewhat difficult - Not difficult at all Not difficult at all  Some recent data might be hidden  MD note: patient does not appear clinically depressed today  Fall Risk  07/12/2018 06/21/2018 03/30/2018  03/28/2018 02/15/2018  Falls in the past year? 1 0 1 - Yes  Comment - - - - -  Number falls in past yr: 1 - 1 1 2  or more  Injury with Fall? 1 - 0 0 Yes  Risk Factor Category  - - - - High Fall Risk  Risk for fall due to : History of fall(s);Impaired balance/gait;Impaired mobility - - - -  Follow up - - - - -    Relevant past medical, surgical, family and social history reviewed Past Medical History:  Diagnosis Date  . Anemia    low iron  . Anxiety   . Arthritis   . Bilateral cataracts   . Bronchitis    hx of   . Cataract   . Chronic kidney disease   . Depression   . Diabetes mellitus without complication ([place])   . 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 GERD (gastroesophageal reflux disease)   . Headache   . History of frequent urinary tract infections   . Hx of renal cell cancer    LEFT  . Hyperlipidemia   .  Hypertension   . Numbness and tingling    right hand   . Obesity   . Osteopenia 10/05/2017  . Peripheral artery disease ([place])   . Urinary frequency   . Urinary incontinence    Past Surgical History:  Procedure Laterality Date  . ABDOMINAL HYSTERECTOMY  1978  . CARPAL TUNNEL RELEASE Bilateral   . CHOLECYSTECTOMY    . COLONOSCOPY    . COLONOSCOPY WITH ESOPHAGOGASTRODUODENOSCOPY (EGD)    . ERCP N/A 08/19/2017   Procedure: ENDOSCOPIC RETROGRADE CHOLANGIOPANCREATOGRAPHY (ERCP);  Surgeon: Arta Silence, MD;  Location: [facility] END[facility]COPY;  Service: Endoscopy;  Laterality: N/A;  . LEG AMPUTATION     above the knee / left   . ROBOT ASSISTED LAPAROSCOPIC NEPHRECTOMY Left 01/15/2015   Procedure: ROBOTIC ASSISTED LAPAROSCOPIC RADICAL NEPHRECTOMY;  Surgeon: Alexis Frock, MD;  Location: [facility] ORS;  Service: Urology;  Laterality: Left;  . stent placement right leg      Family History  Problem Relation Age of Onset  . Cancer Mother        Stomach  . Cancer Father   . Diabetes Brother   . Cancer Brother        oral  . Healthy Son     . Healthy Sister    Social History   Tobacco Use  . Smoking status: Former Smoker    Packs/day: 1.50    Years: 51.00    Pack years: 76.50    Types: Cigarettes    Last attempt to quit: 05/24/2004    Years since quitting: 14.1  . Smokeless tobacco: Never Used  . Tobacco comment: smoking cessation materials not required  Substance Use Topics  . Alcohol use: Not Currently  . Drug use: No     Office Visit from 07/12/2018 in [facility]  AUDIT-C Score  0      Interim medical history since last visit reviewed. Allergies and medications reviewed  Review of Systems Per HPI unless specifically indicated above     Objective:    BP 130/76   Pulse 71   Temp 98.2 F (36.8 C) (Oral)   Resp 14   Ht 5\' 2"  (1.575 m)   Wt 158 lb 12.8 oz (72 kg)   SpO2 91%   BMI 29.04 kg/m   Wt Readings from Last 3 Encounters:  07/17/18 158 lb (71.7 kg)  07/12/18 158 lb 12.8 oz (72 kg)  07/02/18 162 lb 4.1 oz (73.6 kg)  MD note: no one actually weighed her on Jan 29th or Feb 9th The husband has been telling people verbally what her previous weight was Today she was weighed on the scale with the wheelchair and then staff subtracted estimated weight from total (they did not weigh the wheelchair)  Physical Exam Constitutional:      General: She is not in acute distress.    Appearance: She is well-developed. She is obese. She is not diaphoretic.  HENT:     Head: Normocephalic and atraumatic.  Eyes:     General: No scleral icterus. Neck:     Thyroid: No thyromegaly.  Cardiovascular:     Rate and Rhythm: Normal rate and regular rhythm.     Heart sounds: Normal heart sounds. No murmur.  Pulmonary:     Effort: Pulmonary effort is normal. No respiratory distress.     Breath sounds: Normal breath sounds. No wheezing.  Abdominal:     General: Bowel sounds are normal. There is no distension.     Palpations: Abdomen is  soft.  Musculoskeletal:     Left knee: She exhibits  deformity (status post LEFT AKA).  Feet:     Right foot:     Protective Sensation: 5 sites tested. 5 sites sensed.     Left foot:     Amputation: Left leg is amputated above knee.  Skin:    General: Skin is warm and dry.     Coloration: Skin is not pale.  Neurological:     Mental Status: She is alert.  Psychiatric:        Mood and Affect: Mood is not depressed. Affect is not flat.        Behavior: Behavior normal.        Thought Content: Thought content normal.        Judgment: Judgment normal.     Comments: Very limited historian; pleasant and cooperative though    Diabetic Foot Form - Detailed   Diabetic Foot Exam - detailed Diabetic Foot exam was performed with the following findings:  Yes 07/11/2018  4:00 AM  Visual Foot Exam completed.:  Yes  Pulse Foot Exam completed.:  Yes  Right Dorsalis Pedis:  Present   Sensory Foot Exam Completed.:  Yes Semmes-Weinstein Monofilament Test R Site 1-Great Toe:  Pos     Comments:  Status post LEFT AKA        Assessment & Plan:   Problem List Items Addressed This Visit      Endocrine   Diabetes mellitus type 2, controlled ([place])   Relevant Orders   Hemoglobin A1C (Completed)     Nervous and Auditory   Late onset Alzheimer's disease with behavioral disturbance ([place])   Relevant Orders   Ambulatory referral to Psychiatry     Other   S/P AKA (above knee amputation) unilateral, left ([place])   Relevant Orders   Ambulatory referral to Psychiatry    Other Visit Diagnoses    Hospital discharge follow-up    -  Primary   hospital records reviewed; medications reconciled   Pneumonia of right lower lobe due to infectious organism [facility])       Relevant Orders   CBC with Differential/Platelet (Completed)   DG Chest 2 View   Depression, recurrent ([place])       Relevant Orders   Ambulatory referral to Psychiatry   Medication monitoring encounter       Relevant Orders   COMPLETE METABOLIC PANEL WITH GFR (Completed)       Follow up  plan: No follow-ups on file.  An after-visit summary was printed and given to the patient at [place].  Please see the patient instructions which may contain other information and recommendations beyond what is mentioned above in the assessment and plan.  No orders of the defined types were placed in this encounter.   Orders Placed This Encounter  Procedures  . DG Chest 2 View  . Hemoglobin A1C  . CBC with Differential/Platelet  . COMPLETE METABOLIC PANEL WITH GFR  . Ambulatory referral to Psychiatry

## 2018-07-12 NOTE — Patient Outreach (Signed)
  [facility]) Care Management  07/12/2018  BREELY PANIK Feb 19, 1936 [id number]   Care coordination  [facility] Cm received a return call from [facility]  She and Cm reviewed the care intervention each offered to Mr Suchecki for assist with care of Mrs Leon related upcoming hernia surgery for Mr Masih Mr Curtiss has been offered all possible resources   [facility] RN CM willfollow up with Mr Beadles within 1 business day related to his search for personal care services for the patient    Joelene Millin L. Lavina Hamman, RN, BSN, [place] Coordinator Office number [phone] Mobile number [phone]  Main [facility] number [phone] Fax number [phone]

## 2018-07-12 NOTE — Telephone Encounter (Signed)
Verbal orders given  

## 2018-07-13 ENCOUNTER — Telehealth: Payer: Self-pay

## 2018-07-13 DIAGNOSIS — D649 Anemia, unspecified: Secondary | ICD-10-CM

## 2018-07-13 LAB — CBC WITH DIFFERENTIAL/PLATELET
Absolute Monocytes: 814 cells/uL (ref 200–950)
BASOS PCT: 0.9 %
Basophils Absolute: 99 cells/uL (ref 0–200)
Eosinophils Absolute: 407 cells/uL (ref 15–500)
Eosinophils Relative: 3.7 %
HCT: 35.2 % (ref 35.0–45.0)
Hemoglobin: 11.4 g/dL — ABNORMAL LOW (ref 11.7–15.5)
Lymphs Abs: 2431 cells/uL (ref 850–3900)
MCH: 29.8 pg (ref 27.0–33.0)
MCHC: 32.4 g/dL (ref 32.0–36.0)
MCV: 91.9 fL (ref 80.0–100.0)
MPV: 11.8 fL (ref 7.5–12.5)
Monocytes Relative: 7.4 %
Neutro Abs: 7249 cells/uL (ref 1500–7800)
Neutrophils Relative %: 65.9 %
PLATELETS: 314 10*3/uL (ref 140–400)
RBC: 3.83 10*6/uL (ref 3.80–5.10)
RDW: 15 % (ref 11.0–15.0)
TOTAL LYMPHOCYTE: 22.1 %
WBC: 11 10*3/uL — ABNORMAL HIGH (ref 3.8–10.8)

## 2018-07-13 LAB — HEMOGLOBIN A1C
Hgb A1c MFr Bld: 5.7 % of total Hgb — ABNORMAL HIGH (ref <5.7)
Mean Plasma Glucose: 117 (calc)
eAG (mmol/L): 6.5 (calc)

## 2018-07-13 LAB — COMPLETE METABOLIC PANEL WITH GFR
AG Ratio: 0.9 (calc) — ABNORMAL LOW (ref 1.0–2.5)
ALT: 13 U/L (ref 6–29)
AST: 16 U/L (ref 10–35)
Albumin: 2.7 g/dL — ABNORMAL LOW (ref 3.6–5.1)
Alkaline phosphatase (APISO): 120 U/L (ref 37–153)
BUN/Creatinine Ratio: 9 (calc) (ref 6–22)
BUN: 23 mg/dL (ref 7–25)
CO2: 25 mmol/L (ref 20–32)
Calcium: 8.6 mg/dL (ref 8.6–10.4)
Chloride: 107 mmol/L (ref 98–110)
Creat: 2.51 mg/dL — ABNORMAL HIGH (ref 0.60–0.88)
GFR, Est African American: 20 mL/min/{1.73_m2} — ABNORMAL LOW (ref >=60)
GFR, Est Non African American: 17 mL/min/{1.73_m2} — ABNORMAL LOW (ref >=60)
GLOBULIN: 3 g/dL (ref 1.9–3.7)
Glucose, Bld: 109 mg/dL — ABNORMAL HIGH (ref 65–99)
Potassium: 4.8 mmol/L (ref 3.5–5.3)
Sodium: 143 mmol/L (ref 135–146)
TOTAL PROTEIN: 5.7 g/dL — AB (ref 6.1–8.1)
Total Bilirubin: 0.6 mg/dL (ref 0.2–1.2)

## 2018-07-13 NOTE — Telephone Encounter (Signed)
-----   Message from Arnetha Courser, MD sent at 07/13/2018  7:55 AM EST ----- [name], please let the patient know that her blood sugar shows excellent control She has mild anemia; we'll want to recheck this in one month (please ORDER CBC and ferritin, TIBC, iron) Her kidney function worsened; her GFR is 17; please contact her kidney doctor and let them know numbers Her albumin is low, so I'd like her to increase lean protein (quinoa, egg whites, baked chicken, etc.)

## 2018-07-14 DIAGNOSIS — J181 Lobar pneumonia, unspecified organism: Secondary | ICD-10-CM | POA: Diagnosis not present

## 2018-07-14 DIAGNOSIS — J440 Chronic obstructive pulmonary disease with acute lower respiratory infection: Secondary | ICD-10-CM | POA: Diagnosis not present

## 2018-07-14 DIAGNOSIS — F0390 Unspecified dementia without behavioral disturbance: Secondary | ICD-10-CM | POA: Diagnosis not present

## 2018-07-14 DIAGNOSIS — C642 Malignant neoplasm of left kidney, except renal pelvis: Secondary | ICD-10-CM | POA: Diagnosis not present

## 2018-07-14 DIAGNOSIS — J441 Chronic obstructive pulmonary disease with (acute) exacerbation: Secondary | ICD-10-CM | POA: Diagnosis not present

## 2018-07-14 DIAGNOSIS — E1122 Type 2 diabetes mellitus with diabetic chronic kidney disease: Secondary | ICD-10-CM | POA: Diagnosis not present

## 2018-07-14 DIAGNOSIS — N183 Chronic kidney disease, stage 3 (moderate): Secondary | ICD-10-CM | POA: Diagnosis not present

## 2018-07-14 DIAGNOSIS — I129 Hypertensive chronic kidney disease with stage 1 through stage 4 chronic kidney disease, or unspecified chronic kidney disease: Secondary | ICD-10-CM | POA: Diagnosis not present

## 2018-07-14 DIAGNOSIS — J029 Acute pharyngitis, unspecified: Secondary | ICD-10-CM | POA: Diagnosis not present

## 2018-07-17 ENCOUNTER — Emergency Department: Payer: Medicare HMO

## 2018-07-17 ENCOUNTER — Other Ambulatory Visit: Payer: Self-pay

## 2018-07-17 ENCOUNTER — Inpatient Hospital Stay
Admission: EM | Admit: 2018-07-17 | Discharge: 2018-07-21 | DRG: 194 | Disposition: A | Discharge status: Home Health | Payer: Medicare HMO | Attending: Internal Medicine | Admitting: Internal Medicine

## 2018-07-17 ENCOUNTER — Telehealth: Payer: Self-pay

## 2018-07-17 DIAGNOSIS — Z7902 Long term (current) use of antithrombotics/antiplatelets: Secondary | ICD-10-CM | POA: Diagnosis not present

## 2018-07-17 DIAGNOSIS — R9431 Abnormal electrocardiogram [ECG] [EKG]: Secondary | ICD-10-CM | POA: Diagnosis not present

## 2018-07-17 DIAGNOSIS — Z8701 Personal history of pneumonia (recurrent): Secondary | ICD-10-CM

## 2018-07-17 DIAGNOSIS — I129 Hypertensive chronic kidney disease with stage 1 through stage 4 chronic kidney disease, or unspecified chronic kidney disease: Secondary | ICD-10-CM | POA: Diagnosis not present

## 2018-07-17 DIAGNOSIS — R4689 Other symptoms and signs involving appearance and behavior: Secondary | ICD-10-CM | POA: Diagnosis not present

## 2018-07-17 DIAGNOSIS — F329 Major depressive disorder, single episode, unspecified: Secondary | ICD-10-CM | POA: Diagnosis present

## 2018-07-17 DIAGNOSIS — E1136 Type 2 diabetes mellitus with diabetic cataract: Secondary | ICD-10-CM | POA: Diagnosis present

## 2018-07-17 DIAGNOSIS — R4189 Other symptoms and signs involving cognitive functions and awareness: Secondary | ICD-10-CM | POA: Diagnosis not present

## 2018-07-17 DIAGNOSIS — Z85528 Personal history of other malignant neoplasm of kidney: Secondary | ICD-10-CM | POA: Diagnosis not present

## 2018-07-17 DIAGNOSIS — Y95 Nosocomial condition: Secondary | ICD-10-CM | POA: Diagnosis present

## 2018-07-17 DIAGNOSIS — E119 Type 2 diabetes mellitus without complications: Secondary | ICD-10-CM | POA: Diagnosis not present

## 2018-07-17 DIAGNOSIS — E875 Hyperkalemia: Secondary | ICD-10-CM | POA: Diagnosis present

## 2018-07-17 DIAGNOSIS — Z833 Family history of diabetes mellitus: Secondary | ICD-10-CM | POA: Diagnosis not present

## 2018-07-17 DIAGNOSIS — F0390 Unspecified dementia without behavioral disturbance: Secondary | ICD-10-CM | POA: Diagnosis present

## 2018-07-17 DIAGNOSIS — F23 Brief psychotic disorder: Secondary | ICD-10-CM | POA: Diagnosis not present

## 2018-07-17 DIAGNOSIS — Z86718 Personal history of other venous thrombosis and embolism: Secondary | ICD-10-CM

## 2018-07-17 DIAGNOSIS — Z89612 Acquired absence of left leg above knee: Secondary | ICD-10-CM | POA: Diagnosis not present

## 2018-07-17 DIAGNOSIS — Z9049 Acquired absence of other specified parts of digestive tract: Secondary | ICD-10-CM

## 2018-07-17 DIAGNOSIS — Z79899 Other long term (current) drug therapy: Secondary | ICD-10-CM

## 2018-07-17 DIAGNOSIS — F419 Anxiety disorder, unspecified: Secondary | ICD-10-CM | POA: Diagnosis not present

## 2018-07-17 DIAGNOSIS — E785 Hyperlipidemia, unspecified: Secondary | ICD-10-CM | POA: Diagnosis present

## 2018-07-17 DIAGNOSIS — C642 Malignant neoplasm of left kidney, except renal pelvis: Secondary | ICD-10-CM | POA: Diagnosis not present

## 2018-07-17 DIAGNOSIS — N183 Chronic kidney disease, stage 3 (moderate): Secondary | ICD-10-CM | POA: Diagnosis not present

## 2018-07-17 DIAGNOSIS — F411 Generalized anxiety disorder: Secondary | ICD-10-CM | POA: Diagnosis present

## 2018-07-17 DIAGNOSIS — Z905 Acquired absence of kidney: Secondary | ICD-10-CM

## 2018-07-17 DIAGNOSIS — N179 Acute kidney failure, unspecified: Secondary | ICD-10-CM | POA: Diagnosis present

## 2018-07-17 DIAGNOSIS — R05 Cough: Secondary | ICD-10-CM | POA: Diagnosis not present

## 2018-07-17 DIAGNOSIS — E1151 Type 2 diabetes mellitus with diabetic peripheral angiopathy without gangrene: Secondary | ICD-10-CM | POA: Diagnosis present

## 2018-07-17 DIAGNOSIS — N184 Chronic kidney disease, stage 4 (severe): Secondary | ICD-10-CM | POA: Diagnosis not present

## 2018-07-17 DIAGNOSIS — R0602 Shortness of breath: Secondary | ICD-10-CM

## 2018-07-17 DIAGNOSIS — J189 Pneumonia, unspecified organism: Secondary | ICD-10-CM | POA: Diagnosis not present

## 2018-07-17 DIAGNOSIS — R0902 Hypoxemia: Secondary | ICD-10-CM | POA: Diagnosis not present

## 2018-07-17 DIAGNOSIS — J441 Chronic obstructive pulmonary disease with (acute) exacerbation: Secondary | ICD-10-CM | POA: Diagnosis present

## 2018-07-17 DIAGNOSIS — J029 Acute pharyngitis, unspecified: Secondary | ICD-10-CM | POA: Diagnosis not present

## 2018-07-17 DIAGNOSIS — K219 Gastro-esophageal reflux disease without esophagitis: Secondary | ICD-10-CM | POA: Diagnosis present

## 2018-07-17 DIAGNOSIS — Z90710 Acquired absence of both cervix and uterus: Secondary | ICD-10-CM | POA: Diagnosis not present

## 2018-07-17 DIAGNOSIS — Z87891 Personal history of nicotine dependence: Secondary | ICD-10-CM

## 2018-07-17 DIAGNOSIS — J181 Lobar pneumonia, unspecified organism: Secondary | ICD-10-CM | POA: Diagnosis not present

## 2018-07-17 DIAGNOSIS — J440 Chronic obstructive pulmonary disease with acute lower respiratory infection: Secondary | ICD-10-CM | POA: Diagnosis not present

## 2018-07-17 DIAGNOSIS — E1122 Type 2 diabetes mellitus with diabetic chronic kidney disease: Secondary | ICD-10-CM | POA: Diagnosis present

## 2018-07-17 DIAGNOSIS — I10 Essential (primary) hypertension: Secondary | ICD-10-CM | POA: Diagnosis not present

## 2018-07-17 DIAGNOSIS — J439 Emphysema, unspecified: Secondary | ICD-10-CM | POA: Diagnosis not present

## 2018-07-17 LAB — COMPREHENSIVE METABOLIC PANEL
ALT: 12 U/L (ref 0–44)
ANION GAP: 8 (ref 5–15)
AST: 18 U/L (ref 15–41)
Albumin: 2.9 g/dL — ABNORMAL LOW (ref 3.5–5.0)
Alkaline Phosphatase: 111 U/L (ref 38–126)
BUN: 22 mg/dL (ref 8–23)
CO2: 25 mmol/L (ref 22–32)
Calcium: 8.6 mg/dL — ABNORMAL LOW (ref 8.9–10.3)
Chloride: 106 mmol/L (ref 98–111)
Creatinine, Ser: 2.2 mg/dL — ABNORMAL HIGH (ref 0.44–1.00)
GFR calc Af Amer: 24 mL/min — ABNORMAL LOW (ref >60)
GFR, EST NON AFRICAN AMERICAN: 20 mL/min — AB (ref >60)
Glucose, Bld: 97 mg/dL (ref 70–99)
Potassium: 4.1 mmol/L (ref 3.5–5.1)
Sodium: 139 mmol/L (ref 135–145)
Total Bilirubin: 1.1 mg/dL (ref 0.3–1.2)
Total Protein: 6.2 g/dL — ABNORMAL LOW (ref 6.5–8.1)

## 2018-07-17 LAB — TROPONIN I

## 2018-07-17 LAB — CBC
HCT: 38.3 % (ref 36.0–46.0)
Hemoglobin: 11.9 g/dL — ABNORMAL LOW (ref 12.0–15.0)
MCH: 29.7 pg (ref 26.0–34.0)
MCHC: 31.1 g/dL (ref 30.0–36.0)
MCV: 95.5 fL (ref 80.0–100.0)
Platelets: 318 10*3/uL (ref 150–400)
RBC: 4.01 MIL/uL (ref 3.87–5.11)
RDW: 16.6 % — ABNORMAL HIGH (ref 11.5–15.5)
WBC: 11.4 10*3/uL — ABNORMAL HIGH (ref 4.0–10.5)
nRBC: 0 % (ref 0.0–0.2)

## 2018-07-17 MED ORDER — IPRATROPIUM-ALBUTEROL 0.5-2.5 (3) MG/3ML IN SOLN
3.0000 mL | Freq: Once | RESPIRATORY_TRACT | Status: AC
  Administered 2018-07-17: 3 mL via RESPIRATORY_TRACT
  Filled 2018-07-17: qty 3

## 2018-07-17 MED ORDER — SODIUM CHLORIDE 0.9 % IV SOLN
2.0000 g | Freq: Once | INTRAVENOUS | Status: DC
  Filled 2018-07-17: qty 2

## 2018-07-17 MED ORDER — SODIUM CHLORIDE 0.9 % IV SOLN: 1.0000 g | Freq: Once | INTRAVENOUS | Status: DC

## 2018-07-17 MED ORDER — SODIUM CHLORIDE 0.9 % IV SOLN
2.0000 g | Freq: Once | INTRAVENOUS | Status: AC
  Administered 2018-07-17: 2 g via INTRAVENOUS
  Filled 2018-07-17: qty 2

## 2018-07-17 MED ORDER — METHYLPREDNISOLONE SODIUM SUCC 125 MG IJ SOLR
125.0000 mg | Freq: Once | INTRAMUSCULAR | Status: AC
  Administered 2018-07-17: 125 mg via INTRAVENOUS
  Filled 2018-07-17: qty 2

## 2018-07-17 MED ORDER — VANCOMYCIN HCL IN DEXTROSE 1-5 GM/200ML-% IV SOLN
1000.0000 mg | Freq: Once | INTRAVENOUS | Status: DC
  Filled 2018-07-17: qty 200

## 2018-07-17 MED ORDER — SODIUM CHLORIDE 0.9 % IV SOLN: 500.0000 mg | Freq: Once | INTRAVENOUS | Status: DC

## 2018-07-17 MED ORDER — VANCOMYCIN HCL 10 G IV SOLR
1500.0000 mg | Freq: Once | INTRAVENOUS | Status: AC
  Administered 2018-07-17: 1500 mg via INTRAVENOUS
  Filled 2018-07-17: qty 1500

## 2018-07-17 NOTE — Telephone Encounter (Signed)
Tommy with [facility] called to report that while at the home, the patients o2 was in the 70's and 80's. Stated he did a nebulizer treatment which got her into the low 90's and then she was back into the 80's and did express there was SOB. Tommy stated while listening to her he could hear wheezing from her chest but not when he listened on her back side. Consulted with Dr. Sanda Klein and she advised that patient be seen in the ER, ASAP. Tommy, patient and patients husband were notified of this.

## 2018-07-17 NOTE — ED Provider Notes (Addendum)
[facility] Emergency Department Provider Note  Time seen: 9:16 PM  I have reviewed the triage vital signs and the nursing notes.   HISTORY  Chief Complaint Shortness of Breath    HPI Amy [name] is a 83 y.o. female with a past medical history of anemia, anxiety, arthritis, depression, diabetes, gastric reflux, hypertension, hyperlipidemia, presents to the emergency department for worsening shortness of breath.  Per record review patient was also recently discharged from hospital 07/03/2018 with a diagnosis of community-acquired pneumonia and COPD exacerbation.  Patient states over the past 2 to 3 days she has had worsening shortness of breath once again as well as occasional cough.  Upon arrival to the emergency department patient hypoxic to 86% on room air with no home O2 baseline requirement.   Past Medical History:  Diagnosis Date  . Anemia    low iron  . Anxiety   . Arthritis   . Bilateral cataracts   . Bronchitis    hx of   . Cataract   . Chronic kidney disease   . Depression   . Diabetes mellitus without complication ([place])   . 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 GERD (gastroesophageal reflux disease)   . Headache   . History of frequent urinary tract infections   . Hx of renal cell cancer    LEFT  . Hyperlipidemia   . Hypertension   . Numbness and tingling    right hand   . Obesity   . Osteopenia 10/05/2017  . Peripheral artery disease ([place])   . Urinary frequency   . Urinary incontinence     Patient Active Problem List   Diagnosis Date Noted  . Community acquired pneumonia of right lower lobe of lung ([place]) 07/02/2018  . Hypoxemia   . COPD exacerbation ([place])   . Renal cell carcinoma ([place])   . Lung nodule 04/23/2018  . Hypertensive renal disease 11/08/2017  . Secondary hyperparathyroidism of renal origin ([place]) 11/08/2017  . QT prolongation 10/27/2017  . Osteopenia 10/05/2017  .  Choledocholithiasis 08/13/2017  . Chronic pain syndrome 04/05/2017  . Depression 11/15/2016  . Iron deficiency anemia 09/08/2016  . Anemia 06/29/2016  . Late onset Alzheimer's disease with behavioral disturbance ([place]) 06/14/2016  . S/P AKA (above knee amputation) unilateral, left ([place]) 06/08/2016  . Wheelchair dependent 03/11/2016  . 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 ([place]) 03/13/2015  . S/p nephrectomy 01/29/2015  . Renal neoplasm 01/15/2015  . Abnormal EKG 01/02/2015  . Renal mass 12/05/2014  . GERD (gastroesophageal reflux disease) 11/20/2014  . GAD (generalized anxiety disorder) 11/20/2014  . AB (asthmatic bronchitis) 11/20/2014  . Aortic atherosclerosis ([place]) 11/20/2014  . Cataract 11/20/2014  . Leg pain 11/20/2014  . Continuous opioid dependence ([place]) 11/20/2014  . CKD (chronic kidney disease) stage 4, GFR 15-29 ml/min (HCC) 11/20/2014  . COPD, mild ([place]) 11/20/2014  . Excessive falling 11/20/2014  . Fatty infiltration of liver 11/20/2014  . Hyperlipidemia 11/20/2014  . Anterior knee pain 11/20/2014  . LBP (low back pain) 11/20/2014  . Malaise and fatigue 11/20/2014  . Abnormal presence of protein in urine 11/20/2014  . PVD (peripheral vascular disease) ([place]) 11/20/2014  . Allergic rhinitis 11/20/2014  . At risk for falling 11/20/2014  . Phantom limb syndrome with pain ([place]) 11/05/2014  . Anxiety 11/05/2014  . Elevated serum creatinine 11/05/2014  . Diabetes mellitus type 2, controlled ([place]) 11/05/2014  .  H/O adenomatous polyp of colon 07/27/2010    Past Surgical History:  Procedure Laterality Date  . ABDOMINAL HYSTERECTOMY  1978  . CARPAL TUNNEL RELEASE Bilateral   . CHOLECYSTECTOMY    . COLONOSCOPY    . COLONOSCOPY WITH ESOPHAGOGASTRODUODENOSCOPY (EGD)    . ERCP N/A 08/19/2017   Procedure: ENDOSCOPIC RETROGRADE CHOLANGIOPANCREATOGRAPHY (ERCP);  Surgeon: Arta Silence, MD;  Location: [facility] ENDOSCOPY;  Service: Endoscopy;  Laterality: N/A;  . LEG  AMPUTATION     above the knee / left   . ROBOT ASSISTED LAPAROSCOPIC NEPHRECTOMY Left 01/15/2015   Procedure: ROBOTIC ASSISTED LAPAROSCOPIC RADICAL NEPHRECTOMY;  Surgeon: Alexis Frock, MD;  Location: [facility] ORS;  Service: Urology;  Laterality: Left;  . stent placement right leg       Prior to Admission medications   Medication Sig Start Date End Date Taking? Authorizing Provider  acetaminophen (TYLENOL) 500 MG tablet Take 1,000 mg by mouth every 6 (six) hours as needed for mild pain, fever or headache.     [provider]  albuterol (PROVENTIL HFA;VENTOLIN HFA) 108 (90 Base) MCG/ACT inhaler Inhale 2 puffs into the lungs every 6 (six) hours as needed for wheezing or shortness of breath. Patient not taking: Reported on 07/12/2018 06/15/17   Roselee Nova, MD  atorvastatin (LIPITOR) 20 MG tablet TAKE 1 TABLET (20 MG TOTAL) BY MOUTH AT BEDTIME. 07/12/18   Lada, Satira Anis, MD  busPIRone (BUSPAR) 10 MG tablet Take 10 mg by mouth 3 (three) times daily.    [provider]  clopidogrel (PLAVIX) 75 MG tablet Take 1 tablet (75 mg total) by mouth daily. 07/28/17   Algernon Huxley, MD  donepezil (ARICEPT) 10 MG tablet Take 1 tablet (10 mg total) by mouth at bedtime. 10/06/17   Arnetha Courser, MD  famotidine (PEPCID) 20 MG tablet Take 1 tablet (20 mg total) by mouth at bedtime. To help prevent heartburn, reflux 04/05/18   Lada, Satira Anis, MD  furosemide (LASIX) 20 MG tablet Take 40 mg by mouth daily.  09/02/17   [provider]  gabapentin (NEURONTIN) 300 MG capsule Take 1 capsule (300 mg total) by mouth 3 (three) times daily. 06/21/18   Gillis Santa, MD  HYDROcodone-acetaminophen (NORCO) 7.5-325 MG tablet Take 1 tablet by mouth every 8 (eight) hours as needed for up to 30 days. For chronic pain.  To fill on or after: 06-21-2018, 07-21-2018, 08-20-2018 07/21/18 08/20/18  Gillis Santa, MD  ipratropium-albuterol (DUONEB) 0.5-2.5 (3) MG/3ML SOLN INHALE THE CONTENTS OF 1 VIAL VIA NEBULIZER THREE  TIMES DAILY AS DIRECTED 11/10/17   Laverle Hobby, MD  metoprolol tartrate (LOPRESSOR) 50 MG tablet Take 1 tablet (50 mg total) by mouth 2 (two) times daily. 06/15/17   Roselee Nova, MD  QUEtiapine (SEROQUEL) 50 MG tablet Take 50 mg by mouth at bedtime. 10/27/17   [provider]  Zinc Oxide (DESITIN RAPID RELIEF) 13 % CREA Apply 1 application topically 3 (three) times daily as needed. 04/25/18   Gregor Hams, MD    Allergies  Allergen Reactions  . Fentanyl Other (See Comments)    urine retention  . Penicillins Itching, Rash and Other (See Comments)    Has patient had a PCN reaction causing immediate rash, facial/tongue/throat swelling, SOB or lightheadedness with hypotension: Yes Has patient had a PCN reaction causing severe rash involving mucus membranes or skin necrosis: No Has patient had a PCN reaction that required hospitalization: No Has patient had a PCN reaction occurring within the  last 10 years: Yes If all of the above answers are "NO", then may proceed with Cephalosporin use.    Family History  Problem Relation Age of Onset  . Cancer Mother        Stomach  . Cancer Father   . Diabetes Brother   . Cancer Brother        oral  . Healthy Son   . Healthy Sister     Social History Social History   Tobacco Use  . Smoking status: Former Smoker    Packs/day: 1.50    Years: 51.00    Pack years: 76.50    Types: Cigarettes    Last attempt to quit: 05/24/2004    Years since quitting: 14.1  . Smokeless tobacco: Never Used  . Tobacco comment: smoking cessation materials not required  Substance Use Topics  . Alcohol use: Not Currently  . Drug use: No    Review of Systems Constitutional: Negative for fever. Cardiovascular: Negative for chest pain. Respiratory: Positive for shortness of breath and cough. Gastrointestinal: Negative for abdominal pain, vomiting  Genitourinary: Negative for urinary compaints Musculoskeletal: Negative for musculoskeletal  complaints Skin: Negative for skin complaints  Neurological: Negative for headache All other ROS negative  ____________________________________________   PHYSICAL EXAM:  VITAL SIGNS: ED Triage Vitals  Enc Vitals Group     BP 07/17/18 1700 (!) 127/99     Pulse Rate 07/17/18 1700 75     Resp 07/17/18 1700 18     Temp 07/17/18 1700 (!) 97.5 F (36.4 C)     Temp Source 07/17/18 1700 Oral     SpO2 07/17/18 1700 (!) 86 %     Weight 07/17/18 1701 158 lb (71.7 kg)     Height 07/17/18 1701 5\' 2"  (1.575 m)     Head Circumference --      Peak Flow --      Pain Score 07/17/18 1700 8     Pain Loc --      Pain Edu? --      Excl. in [place]? --    Constitutional: Alert and oriented. Well appearing and in no distress. Eyes: Normal exam ENT   Head: Normocephalic and atraumatic.   Mouth/Throat: Mucous membranes are moist. Cardiovascular: Normal rate, regular rhythm.  Respiratory: Normal respiratory effort without tachypnea nor retractions. Breath sounds are clear  Gastrointestinal: Soft and nontender. No distention.  Musculoskeletal: Nontender with normal range of motion in all extremities. Neurologic:  Normal speech and language. No gross focal neurologic deficits Skin:  Skin is warm, dry and intact.  Psychiatric: Mood and affect are normal  ____________________________________________    EKG  EKG viewed and interpreted by myself shows a normal sinus rhythm 81 bpm with a narrow QRS, normal axis, normal intervals, nonspecific ST changes.  ____________________________________________    RADIOLOGY  Chest x-ray is negative. CT scan of the chest shows a round posterior right lower lobe opacity.  ____________________________________________   INITIAL IMPRESSION / ASSESSMENT AND PLAN / ED COURSE  Pertinent labs & imaging results that were available during my care of the patient were reviewed by me and considered in my medical decision making (see chart for  details).  Presents emergency department for shortness of breath and cough found to be hypoxic 86% on room air with no home O2 requirement.  Differential would include pneumonia, pneumothorax, ACS, CHF or pulmonary edema.  Patient's chest x-ray is negative however given new oxygen requirement we will proceed with CT imaging of the chest.  Patient CT scan shows a new right lower lobe pneumonia.  We will cover with antibiotics for H CAP given her recent hospital admission.  Patient will be admitted to the hospitalist service.  Maintaining sats in the low to mid 90s on 4 L of oxygen via nasal cannula.  Blood work shows mild leukocytosis of 11,400 with a negative troponin.  Renal insufficiency is largely unchanged from baseline.  CRITICAL CARE Performed by: Harvest Dark   Total critical care time: 30 minutes  Critical care time was exclusive of separately billable procedures and treating other patients.  Critical care was necessary to treat or prevent imminent or life-threatening deterioration.  Critical care was time spent personally by me on the following activities: development of treatment plan with patient and/or surrogate as well as nursing, discussions with consultants, evaluation of patient's response to treatment, examination of patient, obtaining history from patient or surrogate, ordering and performing treatments and interventions, ordering and review of laboratory studies, ordering and review of radiographic studies, pulse oximetry and re-evaluation of patient's condition.   ____________________________________________   FINAL CLINICAL IMPRESSION(S) / ED DIAGNOSES  Pneumonia Hypoxia   Harvest Dark, MD 07/18/18 Gayla Doss, MD 08/01/18 [phone]

## 2018-07-17 NOTE — ED Notes (Signed)
Pt roomed from triage. Here for SOB and low oxygen saturation. On 4 L [place]. Placed on cardiac, SpO2 and NIBP monitoring at this time. Call bell within reach, Fmaily at bedside. Awaits provider evaluation at this time.

## 2018-07-17 NOTE — ED Triage Notes (Addendum)
Pt was having PT at home today and the therapist checked the pt O2 levels in the 80's and called her PCP Dr. Marzetta Board who referred the pt to the ED for eval. Husband states the pt was recently hospitalized for PNE. States she has not felt well and has been SOB since her discharge. O2 sats 82-86% in triage.

## 2018-07-17 NOTE — ED Notes (Signed)
Pt dropped to 83% after trialed off of oxygen, placed back on 3L at this time.

## 2018-07-18 DIAGNOSIS — R4189 Other symptoms and signs involving cognitive functions and awareness: Secondary | ICD-10-CM | POA: Diagnosis not present

## 2018-07-18 DIAGNOSIS — E1136 Type 2 diabetes mellitus with diabetic cataract: Secondary | ICD-10-CM | POA: Diagnosis present

## 2018-07-18 DIAGNOSIS — E785 Hyperlipidemia, unspecified: Secondary | ICD-10-CM | POA: Diagnosis present

## 2018-07-18 DIAGNOSIS — R0902 Hypoxemia: Secondary | ICD-10-CM | POA: Diagnosis present

## 2018-07-18 DIAGNOSIS — Y95 Nosocomial condition: Secondary | ICD-10-CM | POA: Diagnosis present

## 2018-07-18 DIAGNOSIS — Z90710 Acquired absence of both cervix and uterus: Secondary | ICD-10-CM | POA: Diagnosis not present

## 2018-07-18 DIAGNOSIS — Z89612 Acquired absence of left leg above knee: Secondary | ICD-10-CM | POA: Diagnosis not present

## 2018-07-18 DIAGNOSIS — Z85528 Personal history of other malignant neoplasm of kidney: Secondary | ICD-10-CM | POA: Diagnosis not present

## 2018-07-18 DIAGNOSIS — E1122 Type 2 diabetes mellitus with diabetic chronic kidney disease: Secondary | ICD-10-CM | POA: Diagnosis present

## 2018-07-18 DIAGNOSIS — Z79899 Other long term (current) drug therapy: Secondary | ICD-10-CM | POA: Diagnosis not present

## 2018-07-18 DIAGNOSIS — Z833 Family history of diabetes mellitus: Secondary | ICD-10-CM | POA: Diagnosis not present

## 2018-07-18 DIAGNOSIS — N184 Chronic kidney disease, stage 4 (severe): Secondary | ICD-10-CM | POA: Diagnosis present

## 2018-07-18 DIAGNOSIS — N179 Acute kidney failure, unspecified: Secondary | ICD-10-CM | POA: Diagnosis present

## 2018-07-18 DIAGNOSIS — I129 Hypertensive chronic kidney disease with stage 1 through stage 4 chronic kidney disease, or unspecified chronic kidney disease: Secondary | ICD-10-CM | POA: Diagnosis present

## 2018-07-18 DIAGNOSIS — F419 Anxiety disorder, unspecified: Secondary | ICD-10-CM | POA: Diagnosis not present

## 2018-07-18 DIAGNOSIS — E1151 Type 2 diabetes mellitus with diabetic peripheral angiopathy without gangrene: Secondary | ICD-10-CM | POA: Diagnosis present

## 2018-07-18 DIAGNOSIS — Z86718 Personal history of other venous thrombosis and embolism: Secondary | ICD-10-CM | POA: Diagnosis not present

## 2018-07-18 DIAGNOSIS — K219 Gastro-esophageal reflux disease without esophagitis: Secondary | ICD-10-CM | POA: Diagnosis present

## 2018-07-18 DIAGNOSIS — F329 Major depressive disorder, single episode, unspecified: Secondary | ICD-10-CM | POA: Diagnosis present

## 2018-07-18 DIAGNOSIS — Z8701 Personal history of pneumonia (recurrent): Secondary | ICD-10-CM | POA: Diagnosis not present

## 2018-07-18 DIAGNOSIS — J440 Chronic obstructive pulmonary disease with acute lower respiratory infection: Secondary | ICD-10-CM | POA: Diagnosis present

## 2018-07-18 DIAGNOSIS — Z7902 Long term (current) use of antithrombotics/antiplatelets: Secondary | ICD-10-CM | POA: Diagnosis not present

## 2018-07-18 DIAGNOSIS — J189 Pneumonia, unspecified organism: Secondary | ICD-10-CM | POA: Diagnosis present

## 2018-07-18 DIAGNOSIS — F0390 Unspecified dementia without behavioral disturbance: Secondary | ICD-10-CM | POA: Diagnosis present

## 2018-07-18 DIAGNOSIS — F23 Brief psychotic disorder: Secondary | ICD-10-CM | POA: Diagnosis present

## 2018-07-18 DIAGNOSIS — R4689 Other symptoms and signs involving appearance and behavior: Secondary | ICD-10-CM | POA: Diagnosis not present

## 2018-07-18 DIAGNOSIS — Z87891 Personal history of nicotine dependence: Secondary | ICD-10-CM | POA: Diagnosis not present

## 2018-07-18 DIAGNOSIS — J441 Chronic obstructive pulmonary disease with (acute) exacerbation: Secondary | ICD-10-CM | POA: Diagnosis present

## 2018-07-18 LAB — BASIC METABOLIC PANEL
Anion gap: 9 (ref 5–15)
BUN: 24 mg/dL — ABNORMAL HIGH (ref 8–23)
CALCIUM: 8.3 mg/dL — AB (ref 8.9–10.3)
CO2: 21 mmol/L — ABNORMAL LOW (ref 22–32)
CREATININE: 2.01 mg/dL — AB (ref 0.44–1.00)
Chloride: 110 mmol/L (ref 98–111)
GFR calc non Af Amer: 23 mL/min — ABNORMAL LOW (ref >60)
GFR, EST AFRICAN AMERICAN: 26 mL/min — AB (ref >60)
Glucose, Bld: 175 mg/dL — ABNORMAL HIGH (ref 70–99)
Potassium: 4.4 mmol/L (ref 3.5–5.1)
Sodium: 140 mmol/L (ref 135–145)

## 2018-07-18 LAB — GLUCOSE, CAPILLARY
Glucose-Capillary: 159 mg/dL — ABNORMAL HIGH (ref 70–99)
Glucose-Capillary: 167 mg/dL — ABNORMAL HIGH (ref 70–99)
Glucose-Capillary: 150 mg/dL — ABNORMAL HIGH (ref 70–99)
Glucose-Capillary: 161 mg/dL — ABNORMAL HIGH (ref 70–99)
Glucose-Capillary: 207 mg/dL — ABNORMAL HIGH (ref 70–99)
Glucose-Capillary: 124 mg/dL — ABNORMAL HIGH (ref 70–99)

## 2018-07-18 MED ORDER — ACETAMINOPHEN 650 MG RE SUPP: 650.0000 mg | Freq: Four times a day (QID) | RECTAL | Status: DC | PRN

## 2018-07-18 MED ORDER — BUSPIRONE HCL 10 MG PO TABS
10.0000 mg | ORAL_TABLET | Freq: Three times a day (TID) | ORAL | Status: DC
  Administered 2018-07-18 – 2018-07-19 (×3): 10 mg via ORAL
  Filled 2018-07-18: qty 1
  Filled 2018-07-18: qty 2
  Filled 2018-07-18 (×4): qty 1

## 2018-07-18 MED ORDER — HALOPERIDOL LACTATE 5 MG/ML IJ SOLN: 2.0000 mg | Freq: Three times a day (TID) | INTRAMUSCULAR | Status: DC | PRN

## 2018-07-18 MED ORDER — INSULIN ASPART 100 UNIT/ML ~~LOC~~ SOLN
0.0000 [IU] | Freq: Three times a day (TID) | SUBCUTANEOUS | Status: DC
  Administered 2018-07-18: 18:00:00 3 [IU] via SUBCUTANEOUS
  Administered 2018-07-18 – 2018-07-21 (×10): 2 [IU] via SUBCUTANEOUS
  Filled 2018-07-18 (×11): qty 1

## 2018-07-18 MED ORDER — LORAZEPAM 2 MG/ML IJ SOLN
1.0000 mg | Freq: Once | INTRAMUSCULAR | Status: AC
  Administered 2018-07-18: 16:00:00 1 mg via INTRAVENOUS
  Filled 2018-07-18: qty 1

## 2018-07-18 MED ORDER — SODIUM CHLORIDE 0.9 % IV SOLN
1.0000 g | Freq: Two times a day (BID) | INTRAVENOUS | Status: DC
  Administered 2018-07-18 – 2018-07-20 (×5): 1 g via INTRAVENOUS
  Filled 2018-07-18 (×6): qty 1

## 2018-07-18 MED ORDER — IPRATROPIUM-ALBUTEROL 0.5-2.5 (3) MG/3ML IN SOLN
3.0000 mL | RESPIRATORY_TRACT | Status: DC | PRN
  Administered 2018-07-18: 3 mL via RESPIRATORY_TRACT
  Filled 2018-07-18: qty 3

## 2018-07-18 MED ORDER — FAMOTIDINE 20 MG PO TABS
20.0000 mg | ORAL_TABLET | Freq: Every day | ORAL | Status: DC
  Administered 2018-07-19 – 2018-07-21 (×2): 20 mg via ORAL
  Filled 2018-07-18 (×3): qty 1

## 2018-07-18 MED ORDER — ACETAMINOPHEN 325 MG PO TABS
650.0000 mg | ORAL_TABLET | Freq: Four times a day (QID) | ORAL | Status: DC | PRN
  Administered 2018-07-19 – 2018-07-21 (×4): 650 mg via ORAL
  Filled 2018-07-18 (×4): qty 2

## 2018-07-18 MED ORDER — ATORVASTATIN CALCIUM 20 MG PO TABS
20.0000 mg | ORAL_TABLET | Freq: Every day | ORAL | Status: DC
  Administered 2018-07-19 – 2018-07-21 (×2): 20 mg via ORAL
  Filled 2018-07-18 (×3): qty 1

## 2018-07-18 MED ORDER — PROCHLORPERAZINE EDISYLATE 10 MG/2ML IJ SOLN
10.0000 mg | Freq: Four times a day (QID) | INTRAMUSCULAR | Status: DC | PRN
  Administered 2018-07-20: 10 mg via INTRAVENOUS
  Filled 2018-07-18 (×2): qty 2

## 2018-07-18 MED ORDER — INSULIN ASPART 100 UNIT/ML ~~LOC~~ SOLN
0.0000 [IU] | Freq: Every day | SUBCUTANEOUS | Status: DC
  Filled 2018-07-18: qty 1

## 2018-07-18 MED ORDER — HALOPERIDOL 2 MG PO TABS
2.0000 mg | ORAL_TABLET | Freq: Three times a day (TID) | ORAL | Status: DC | PRN
  Filled 2018-07-18: qty 1

## 2018-07-18 MED ORDER — HEPARIN SODIUM (PORCINE) 5000 UNIT/ML IJ SOLN
5000.0000 [IU] | Freq: Three times a day (TID) | INTRAMUSCULAR | Status: DC
  Administered 2018-07-18 – 2018-07-20 (×3): 5000 [IU] via SUBCUTANEOUS
  Filled 2018-07-18 (×6): qty 1

## 2018-07-18 MED ORDER — CLOPIDOGREL BISULFATE 75 MG PO TABS
75.0000 mg | ORAL_TABLET | Freq: Every day | ORAL | Status: DC
  Administered 2018-07-18 – 2018-07-21 (×4): 75 mg via ORAL
  Filled 2018-07-18 (×4): qty 1

## 2018-07-18 MED ORDER — VANCOMYCIN HCL IN DEXTROSE 750-5 MG/150ML-% IV SOLN
750.0000 mg | INTRAVENOUS | Status: DC
  Administered 2018-07-19: 750 mg via INTRAVENOUS
  Filled 2018-07-18: qty 150

## 2018-07-18 MED ORDER — METHYLPREDNISOLONE SODIUM SUCC 125 MG IJ SOLR
60.0000 mg | Freq: Four times a day (QID) | INTRAMUSCULAR | Status: DC
  Administered 2018-07-18 – 2018-07-20 (×10): 60 mg via INTRAVENOUS
  Filled 2018-07-18 (×10): qty 2

## 2018-07-18 NOTE — ED Notes (Signed)
Pt remains alert and oriented to baseline. Pt able to tolerate beverage with no problems. Husband at bedside.

## 2018-07-18 NOTE — ED Notes (Signed)
Admitting MD Dr. Margaretmary Eddy returned call and was updated on pt's condition. Verbal order for a collection of a ABG. RT called for collection.

## 2018-07-18 NOTE — ED Notes (Signed)
Floor RN made aware pt would be brought up and bedside report would be done. Floor nurse agrees to this plan

## 2018-07-18 NOTE — ED Notes (Signed)
Per night shift RN, pt unable to tolerate ABG blood drawl.

## 2018-07-18 NOTE — ED Notes (Signed)
Attempted to call report- floor unable to take report at this time

## 2018-07-18 NOTE — ED Notes (Signed)
PT's brief changed

## 2018-07-18 NOTE — ED Notes (Signed)
Pt given lunch tray.

## 2018-07-18 NOTE — ED Notes (Signed)
Pt's oxygen decreased to 3L

## 2018-07-18 NOTE — Progress Notes (Signed)
[facility] [place] NAME: Amy [name]    MR#:  [id number]  DATE OF BIRTH:  [birth year]  SUBJECTIVE:  CHIEF COMPLAINT:  Pt is agitated, family at bedside, concerned about her unusual behaviour  REVIEW OF SYSTEMS:  Unobtainable as the patient is agitated  DRUG ALLERGIES:   Allergies  Allergen Reactions  . Fentanyl Other (See Comments)    urine retention  . Penicillins Itching, Rash and Other (See Comments)    Has patient had a PCN reaction causing immediate rash, facial/tongue/throat swelling, SOB or lightheadedness with hypotension: Yes Has patient had a PCN reaction causing severe rash involving mucus membranes or skin necrosis: No Has patient had a PCN reaction that required hospitalization: No Has patient had a PCN reaction occurring within the last 10 years: Yes If all of the above answers are "NO", then may proceed with Cephalosporin use.    VITALS:  Blood pressure (!) 105/51, pulse 88, temperature 97.8 F (36.6 C), temperature source Oral, resp. rate 18, height 5\' 2"  (1.575 m), weight 71.7 kg, SpO2 93 %.  PHYSICAL EXAMINATION:  GENERAL:  83 y.o.-year-old patient lying in the bed with no acute distress.  EYES: Pupils equal, round, reactive to light and accommodation. No scleral icterus. Extraocular muscles intact.  HEENT: Head atraumatic, normocephalic. Oropharynx and nasopharynx clear.  NECK:  Supple, no jugular venous distention. No thyroid enlargement, no tenderness.  LUNGS: Normal breath sounds bilaterally, no wheezing, rales,rhonchi or crepitation. No use of accessory muscles of respiration.  CARDIOVASCULAR: S1, S2 normal. No murmurs, rubs, or gallops.  ABDOMEN: Soft, nontender, nondistended. Bowel sounds present.[name]  EXTREMITIES: No pedal edema, cyanosis, or clubbing.  NEUROLOGIC: Awake, alert and very agitated and belligerent sensation intact. Gait not checked.  PSYCHIATRIC: The patient is alert and oriented x  1, agitated SKIN: No obvious rash, lesion, or ulcer.    LABORATORY PANEL:   CBC Recent Labs  Lab 07/17/18 1719  WBC 11.4*  HGB 11.9*  HCT 38.3  PLT 318   ------------------------------------------------------------------------------------------------------------------  Chemistries  Recent Labs  Lab 07/17/18 1719 07/18/18 0517  NA 139 140  K 4.1 4.4  CL 106 110  CO2 25 21*  GLUCOSE 97 175*  BUN 22 24*  CREATININE 2.20* 2.01*  CALCIUM 8.6* 8.3*  AST 18  --   ALT 12  --   ALKPHOS 111  --   BILITOT 1.1  --    ------------------------------------------------------------------------------------------------------------------  Cardiac Enzymes Recent Labs  Lab 07/17/18 1719  TROPONINI <0.03   ------------------------------------------------------------------------------------------------------------------  RADIOLOGY:  Dg Chest 2 View  Result Date: 07/17/2018 CLINICAL DATA:  Shortness of breath EXAM: CHEST - 2 VIEW COMPARISON:  07/02/2018 FINDINGS: Linear atelectasis or scarring at the left base. Right lung clear. Heart is borderline in size. No effusions or acute bony abnormality. IMPRESSION: Left basilar scarring or atelectasis.  No active disease. Electronically Signed   By: Rolm Baptise M.D.   On: 07/17/2018 18:39   Ct Chest Wo Contrast  Result Date: 07/17/2018 CLINICAL DATA:  Hypoxemia EXAM: CT CHEST WITHOUT CONTRAST TECHNIQUE: Multidetector CT imaging of the chest was performed following the standard protocol without IV contrast. COMPARISON:  Chest x-ray earlier today.  Chest CT 05/26/2018 FINDINGS: Cardiovascular: Moderate coronary artery and aortic calcifications. No aneurysm. Heart is normal size. Mediastinum/Nodes: Mildly prominent mediastinal lymph nodes are stable since recent study. The largest is a low right paratracheal lymph node with a short axis diameter of 14 mm. Higher right paratracheal lymph node  has a short axis diameter of 11 mm. These are stable  since recent study. No axillary or visible hilar adenopathy. Lungs/Pleura: Emphysema. Medial left upper lobe nodule as a spiculated appearance and measures 13 x 9 mm compared to 12 x 8 mm previously, likely not significantly changed. Posterior left upper lobe nodule on image 51 measures 5 mm and is stable. Left lower lobe nodule measures 8 mm, stable. Rounded airspace opacity posteriorly in the right lower lobe, favor rounded atelectasis. Left base atelectasis or scarring is similar to prior study. Rounded subpleural nodule in the anterior right upper lobe measures 12 mm, stable. Trace right pleural effusion. Upper Abdomen: Imaging into the upper abdomen shows no acute findings. Prior cholecystectomy. Pneumobilia noted in the left hepatic lobe. Musculoskeletal: Chest wall soft tissues are unremarkable. Compression deformity at T12 is stable. No acute bony abnormality. IMPRESSION: Stable nodules in the lungs bilaterally. New rounded posterior right lower lobe opacity could reflect rounded atelectasis or pneumonia. Trace right pleural effusion. Stable mildly enlarged mediastinal lymph nodes. Moderate coronary artery disease. Aortic Atherosclerosis (ICD10-I70.0) and Emphysema (ICD10-J43.9). Electronically Signed   By: Rolm Baptise M.D.   On: 07/17/2018 21:13    EKG:   Orders placed or performed during the hospital encounter of 07/17/18  . ED EKG  . ED EKG    ASSESSMENT AND PLAN:     HCAP (healthcare-associated pneumonia) - CT chest with new right-sided opacity Continue IV antibiotics cefepime and vancomycin  PRN supportive treatment    COPD exacerbation (HCC) -IV Solu-Medrol, other supportive treatment as above, continue home inhalers  Acute psychosis could be from healthcare associated pneumonia Ativan as needed x1.  Patient family is agreeable Psych consult placed-Dr. Leverne Humbles is aware  Diabetes mellitus type 2, controlled ([place]) -sliding scale insulin coverage    Anxiety -home dose anxiolytic  BuSpar    GERD (gastroesophageal reflux disease) - PPI    CKD (chronic kidney disease) stage 4, GFR 15-29 ml/min (HCC) -at baseline, avoid nephrotoxins and monitor      All the records are reviewed and case discussed with Care Management/Social Workerr. Management plans discussed with the patient, family and they are in agreement.  CODE STATUS: fc  TOTAL TIME TAKING CARE OF THIS PATIENT: 68minutes.   POSSIBLE D/C IN 2-3 DAYS, DEPENDING ON CLINICAL CONDITION.  Note: This dictation was prepared with Dragon dictation along with smaller phrase technology. Any transcriptional errors that result from this process are unintentional.   Nicholes Mango M.D on 07/18/2018 at 9:13 PM  Between 7am to 6pm - Pager - [phone] After 6pm go to [email or web address] - password [email or web address] [facility]  Office  [phone]  CC: Primary care physician; Arnetha Courser, MD

## 2018-07-18 NOTE — ED Notes (Addendum)
Pt independently awakens. Pt explained to that she was going to have blood work collected. Pt refuses stating "I am awake now." Pt refusing blood test. Pt alert & oriented to person but not to place, situation, or time. Unclear baseline; will call husband to receive baseline information. Pt's oxygen saturation 94% on 3L

## 2018-07-18 NOTE — Progress Notes (Signed)
Pharmacy Antibiotic Note  EDANA AGUADO is a 83 y.o. female admitted on 07/17/2018 with pneumonia.  Pharmacy has been consulted for vanc/cefepime dosing.  Plan: Patient received vanc 1.5g IV load and cefepime 2g IV x 1 in ED  Vancomycin 750 mg IV Q 36 hrs. Goal AUC 400-550. Expected AUC: 544.3 SCr used: 2.20 mg/dL Css 16.5 mcg/mL  Will continue cefepime 1g IV q12h per CrCl 11 - 29 ml/min  Height: 5\' 2"  (157.5 cm) Weight: 158 lb (71.7 kg) IBW/kg (Calculated) : 50.1  Temp (24hrs), Avg:97.5 F (36.4 C), Min:97.5 F (36.4 C), Max:97.5 F (36.4 C)  Recent Labs  Lab 07/12/18 1521 07/17/18 1719 07/18/18 0517  WBC 11.0* 11.4*  --   CREATININE 2.51* 2.20* 2.01*    Estimated Creatinine Clearance: 20.7 mL/min (A) (by C-G formula based on SCr of 2.01 mg/dL (H)).    Allergies  Allergen Reactions  . Fentanyl Other (See Comments)    urine retention  . Penicillins Itching, Rash and Other (See Comments)    Has patient had a PCN reaction causing immediate rash, facial/tongue/throat swelling, SOB or lightheadedness with hypotension: Yes Has patient had a PCN reaction causing severe rash involving mucus membranes or skin necrosis: No Has patient had a PCN reaction that required hospitalization: No Has patient had a PCN reaction occurring within the last 10 years: Yes If all of the above answers are "NO", then may proceed with Cephalosporin use.    Thank you for allowing pharmacy to be a part of this patient's care.  Tobie Lords, PharmD, BCPS Clinical Pharmacist 07/18/2018

## 2018-07-18 NOTE — ED Notes (Signed)
RT at bedside for ABG draw

## 2018-07-18 NOTE — ED Notes (Signed)
Spoke with pt's husband who states he is almost at the facility now. Husband states it is abnormal for pt to not know where she is or what year it is

## 2018-07-18 NOTE — ED Notes (Signed)
ED TO INPATIENT HANDOFF REPORT  ED Nurse Name and Phone #: Kirke Shaggy 35  S Name/Age/Gender Chales Abrahams 83 y.o. female Room/Bed: ED09A/ED09A  Code Status   Code Status: Full Code  Home/SNF/Other Home Patient oriented to: self Is this baseline? Yes   Triage Complete: Triage complete  Chief Complaint Low blood oxygen  Triage Note Pt was having PT at home today and the therapist checked the pt O2 levels in the 80's and called her PCP Dr. Marzetta Board who referred the pt to the ED for eval. Husband states the pt was recently hospitalized for PNE. States she has not felt well and has been SOB since her discharge. O2 sats 82-86% in triage.    Allergies Allergies  Allergen Reactions  . Fentanyl Other (See Comments)    urine retention  . Penicillins Itching, Rash and Other (See Comments)    Has patient had a PCN reaction causing immediate rash, facial/tongue/throat swelling, SOB or lightheadedness with hypotension: Yes Has patient had a PCN reaction causing severe rash involving mucus membranes or skin necrosis: No Has patient had a PCN reaction that required hospitalization: No Has patient had a PCN reaction occurring within the last 10 years: Yes If all of the above answers are "NO", then may proceed with Cephalosporin use.    Level of Care/Admitting Diagnosis ED Disposition    ED Disposition Condition [facility] Area: [place] [[id number]]  Level of Care: Med-Surg [16]  Diagnosis: HCAP (healthcare-associated pneumonia) [[id number]]  Admitting Physician: Lance Coon [[id number]]  Attending Physician: Lance Coon [phone]  Estimated length of stay: past midnight tomorrow  Certification:: I certify this patient will need inpatient services for at least 2 midnights  PT Class (Do Not Modify): Inpatient [101]  PT Acc Code (Do Not Modify): Private [1]       B Medical/Surgery History Past Medical History:  Diagnosis Date  . Anemia    low  iron  . Anxiety   . Arthritis   . Bilateral cataracts   . Bronchitis    hx of   . Cataract   . Chronic kidney disease   . Depression   . Diabetes mellitus without complication ([place])   . 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 GERD (gastroesophageal reflux disease)   . Headache   . History of frequent urinary tract infections   . Hx of renal cell cancer    LEFT  . Hyperlipidemia   . Hypertension   . Numbness and tingling    right hand   . Obesity   . Osteopenia 10/05/2017  . Peripheral artery disease ([place])   . Urinary frequency   . Urinary incontinence    Past Surgical History:  Procedure Laterality Date  . ABDOMINAL HYSTERECTOMY  1978  . CARPAL TUNNEL RELEASE Bilateral   . CHOLECYSTECTOMY    . COLONOSCOPY    . COLONOSCOPY WITH ESOPHAGOGASTRODUODENOSCOPY (EGD)    . ERCP N/A 08/19/2017   Procedure: ENDOSCOPIC RETROGRADE CHOLANGIOPANCREATOGRAPHY (ERCP);  Surgeon: Arta Silence, MD;  Location: [facility]COPY;  Service: Endoscopy;  Laterality: N/A;  . LEG AMPUTATION     above the knee / left   . ROBOT ASSISTED LAPAROSCOPIC NEPHRECTOMY Left 01/15/2015   Procedure: ROBOTIC ASSISTED LAPAROSCOPIC RADICAL NEPHRECTOMY;  Surgeon: Alexis Frock, MD;  Location: [facility];  Service: Urology;  Laterality: Left;  . stent placement right leg  A IV Location/Drains/Wounds Patient Lines/Drains/Airways Status   Active Line/Drains/Airways    Name:   Placement date:   Placement time:   Site:   Days:   Peripheral IV 07/17/18 Right Antecubital   07/17/18    1719    Antecubital   1   GI Stent 5 Fr.   08/19/17    1225    -   333          Intake/Output Last 24 hours  Intake/Output Summary (Last 24 hours) at 07/18/2018 1450 Last data filed at 07/18/2018 0051 Gross per 24 hour  Intake 601.87 ml  Output -  Net 601.87 ml    Labs/Imaging Results for orders placed or performed during the hospital encounter of 07/17/18 (from the past 48 hour(s))   CBC     Status: Abnormal   Collection Time: 07/17/18  5:19 PM  Result Value Ref Range   WBC 11.4 (H) 4.0 - 10.5 K/uL   RBC 4.01 3.87 - 5.11 MIL/uL   Hemoglobin 11.9 (L) 12.0 - 15.0 g/dL   HCT 38.3 36.0 - 46.0 %   MCV 95.5 80.0 - 100.0 fL   MCH 29.7 26.0 - 34.0 pg   MCHC 31.1 30.0 - 36.0 g/dL   RDW 16.6 (H) 11.5 - 15.5 %   Platelets 318 150 - 400 K/uL   nRBC 0.0 0.0 - 0.2 %    Comment: Performed at [facility], [address] [place], [place] [address]  Comprehensive metabolic panel     Status: Abnormal   Collection Time: 07/17/18  5:19 PM  Result Value Ref Range   Sodium 139 135 - 145 mmol/L   Potassium 4.1 3.5 - 5.1 mmol/L   Chloride 106 98 - 111 mmol/L   CO2 25 22 - 32 mmol/L   Glucose, Bld 97 70 - 99 mg/dL   BUN 22 8 - 23 mg/dL   Creatinine, Ser 2.20 (H) 0.44 - 1.00 mg/dL   Calcium 8.6 (L) 8.9 - 10.3 mg/dL   Total Protein 6.2 (L) 6.5 - 8.1 g/dL   Albumin 2.9 (L) 3.5 - 5.0 g/dL   AST 18 15 - 41 U/L   ALT 12 0 - 44 U/L   Alkaline Phosphatase 111 38 - 126 U/L   Total Bilirubin 1.1 0.3 - 1.2 mg/dL   GFR calc non Af Amer 20 (L) >60 mL/min   GFR calc Af Amer 24 (L) >60 mL/min   Anion gap 8 5 - 15    Comment: Performed at [facility], [address] [place], [place] [address]  Troponin I - ONCE - STAT     Status: None   Collection Time: 07/17/18  5:19 PM  Result Value Ref Range   Troponin I <0.03 <0.03 ng/mL    Comment: Performed at [facility], [address] [place], [place] [address]  Blood culture (routine x 2)     Status: None (Preliminary result)   Collection Time: 07/17/18  9:40 PM  Result Value Ref Range   Specimen Description BLOOD RIGHT ASSIST CONTROL    Special Requests      BOTTLES DRAWN AEROBIC AND ANAEROBIC Blood Culture results may not be optimal due to an excessive volume of blood received in culture bottles   Culture      NO GROWTH < 12 HOURS Performed at [facility], [address], [place], [place]  [address]    Report Status PENDING   Blood culture (routine x 2)  Status: None (Preliminary result)   Collection Time: 07/17/18  9:40 PM  Result Value Ref Range   Specimen Description BLOOD LEFT ASSIST CONTROL    Special Requests      BOTTLES DRAWN AEROBIC AND ANAEROBIC Blood Culture results may not be optimal due to an excessive volume of blood received in culture bottles   Culture      NO GROWTH < 12 HOURS Performed at [facility], [address] [place], [place] [address]    Report Status PENDING   Glucose, capillary     Status: Abnormal   Collection Time: 07/18/18  2:10 AM  Result Value Ref Range   Glucose-Capillary 159 (H) 70 - 99 mg/dL  Basic metabolic panel     Status: Abnormal   Collection Time: 07/18/18  5:17 AM  Result Value Ref Range   Sodium 140 135 - 145 mmol/L   Potassium 4.4 3.5 - 5.1 mmol/L   Chloride 110 98 - 111 mmol/L   CO2 21 (L) 22 - 32 mmol/L   Glucose, Bld 175 (H) 70 - 99 mg/dL   BUN 24 (H) 8 - 23 mg/dL   Creatinine, Ser 2.01 (H) 0.44 - 1.00 mg/dL   Calcium 8.3 (L) 8.9 - 10.3 mg/dL   GFR calc non Af Amer 23 (L) >60 mL/min   GFR calc Af Amer 26 (L) >60 mL/min   Anion gap 9 5 - 15    Comment: Performed at [facility], [address] [place], [place] [address]  Glucose, capillary     Status: Abnormal   Collection Time: 07/18/18  8:10 AM  Result Value Ref Range   Glucose-Capillary 167 (H) 70 - 99 mg/dL  Glucose, capillary     Status: Abnormal   Collection Time: 07/18/18  9:43 AM  Result Value Ref Range   Glucose-Capillary 150 (H) 70 - 99 mg/dL  Glucose, capillary     Status: Abnormal   Collection Time: 07/18/18  1:47 PM  Result Value Ref Range   Glucose-Capillary 161 (H) 70 - 99 mg/dL   Dg Chest 2 View  Result Date: 07/17/2018 CLINICAL DATA:  Shortness of breath EXAM: CHEST - 2 VIEW COMPARISON:  07/02/2018 FINDINGS: Linear atelectasis or scarring at the left base. Right lung clear. Heart is borderline in size. No effusions or  acute bony abnormality. IMPRESSION: Left basilar scarring or atelectasis.  No active disease. Electronically Signed   By: Rolm Baptise M.D.   On: 07/17/2018 18:39   Ct Chest Wo Contrast  Result Date: 07/17/2018 CLINICAL DATA:  Hypoxemia EXAM: CT CHEST WITHOUT CONTRAST TECHNIQUE: Multidetector CT imaging of the chest was performed following the standard protocol without IV contrast. COMPARISON:  Chest x-ray earlier today.  Chest CT 05/26/2018 FINDINGS: Cardiovascular: Moderate coronary artery and aortic calcifications. No aneurysm. Heart is normal size. Mediastinum/Nodes: Mildly prominent mediastinal lymph nodes are stable since recent study. The largest is a low right paratracheal lymph node with a short axis diameter of 14 mm. Higher right paratracheal lymph node has a short axis diameter of 11 mm. These are stable since recent study. No axillary or visible hilar adenopathy. Lungs/Pleura: Emphysema. Medial left upper lobe nodule as a spiculated appearance and measures 13 x 9 mm compared to 12 x 8 mm previously, likely not significantly changed. Posterior left upper lobe nodule on image 51 measures 5 mm and is stable. Left lower lobe nodule measures 8 mm, stable. Rounded airspace opacity posteriorly in the right lower lobe, favor rounded atelectasis. Left  base atelectasis or scarring is similar to prior study. Rounded subpleural nodule in the anterior right upper lobe measures 12 mm, stable. Trace right pleural effusion. Upper Abdomen: Imaging into the upper abdomen shows no acute findings. Prior cholecystectomy. Pneumobilia noted in the left hepatic lobe. Musculoskeletal: Chest wall soft tissues are unremarkable. Compression deformity at T12 is stable. No acute bony abnormality. IMPRESSION: Stable nodules in the lungs bilaterally. New rounded posterior right lower lobe opacity could reflect rounded atelectasis or pneumonia. Trace right pleural effusion. Stable mildly enlarged mediastinal lymph nodes. Moderate  coronary artery disease. Aortic Atherosclerosis (ICD10-I70.0) and Emphysema (ICD10-J43.9). Electronically Signed   By: Rolm Baptise M.D.   On: 07/17/2018 21:13    Pending Labs Unresulted Labs (From admission, onward)    Start     Ordered   07/18/18 0948  Blood gas, arterial ([facility] ONLY)  ONCE - STAT,   STAT     07/18/18 0947   07/18/18 0625  CBC  Once,   R     07/18/18 0625          Vitals/Pain Today's Vitals   07/18/18 1245 07/18/18 1300 07/18/18 1345 07/18/18 1415  BP: (!) 106/41 (!) 108/40 121/62 121/60  Pulse: 86 86 97 98  Resp: (!) 21 (!) 21 20 (!) 24  Temp:      TempSrc:      SpO2: 93% 93% 100% 94%  Weight:      Height:      PainSc:        Isolation Precautions No active isolations  Medications Medications  ipratropium-albuterol (DUONEB) 0.5-2.5 (3) MG/3ML nebulizer solution 3 mL (3 mLs Nebulization Given 07/18/18 1018)  methylPREDNISolone sodium succinate (SOLU-MEDROL) 125 mg/2 mL injection 60 mg (60 mg Intravenous Given 07/18/18 1045)  atorvastatin (LIPITOR) tablet 20 mg (has no administration in time range)  busPIRone (BUSPAR) tablet 10 mg (10 mg Oral Given 07/18/18 1039)  famotidine (PEPCID) tablet 20 mg (has no administration in time range)  clopidogrel (PLAVIX) tablet 75 mg (75 mg Oral Given 07/18/18 1043)  heparin injection 5,000 Units (0 Units Subcutaneous Hold 07/18/18 0621)  acetaminophen (TYLENOL) tablet 650 mg (has no administration in time range)    Or  acetaminophen (TYLENOL) suppository 650 mg (has no administration in time range)  prochlorperazine (COMPAZINE) injection 10 mg (has no administration in time range)  insulin aspart (novoLOG) injection 0-9 Units (2 Units Subcutaneous Given 07/18/18 1351)  insulin aspart (novoLOG) injection 0-5 Units (has no administration in time range)  vancomycin (VANCOCIN) IVPB 750 mg/150 ml premix (has no administration in time range)  ceFEPIme (MAXIPIME) 1 g in sodium chloride 0.9 % 100 mL IVPB (0 g Intravenous  Stopped 07/18/18 0948)  ipratropium-albuterol (DUONEB) 0.5-2.5 (3) MG/3ML nebulizer solution 3 mL (3 mLs Nebulization Given 07/17/18 2146)  methylPREDNISolone sodium succinate (SOLU-MEDROL) 125 mg/2 mL injection 125 mg (125 mg Intravenous Given 07/17/18 2143)  vancomycin (VANCOCIN) 1,500 mg in sodium chloride 0.9 % 500 mL IVPB ( Intravenous Stopped 07/18/18 0046)  ceFEPIme (MAXIPIME) 2 g in sodium chloride 0.9 % 100 mL IVPB ( Intravenous Stopped 07/17/18 2241)    Mobility walks with device High fall risk   Focused Assessments   Respiratory: No home o2 on 3L via nasal canula.       R Recommendations: See Admitting Provider Note  Report given to:   Additional Notes:

## 2018-07-18 NOTE — ED Notes (Signed)
Pt's oxygen increased to 4L

## 2018-07-18 NOTE — ED Notes (Signed)
Pt refusing insulin and CBG at this time

## 2018-07-18 NOTE — Consult Note (Signed)
[facility] Consult   Reason for Consult: Change in mental status, behavioral issues with dementia Referring Physician:  Dr. Margaretmary Eddy Patient Identification: Amy [name] MRN:  [id number] Principal Diagnosis: HCAP (healthcare-associated pneumonia) Diagnosis:  Principal Problem:   HCAP (healthcare-associated pneumonia) Active Problems:   Anxiety   Diabetes mellitus type 2, controlled ([place])   GERD (gastroesophageal reflux disease)   GAD (generalized anxiety disorder)   CKD (chronic kidney disease) stage 4, GFR 15-29 ml/min ([place])   Hyperlipidemia   COPD exacerbation ([place])  Patient is seen, chart is reviewed.  Case reviewed with nursing staff present at bedside.  Family not available for collateral at time of assessment. Total Time spent with patient: 45 minutes  Subjective: "I do not know."  HPI:  Amy [name] is a 83 y.o. female patient admitted with shortness of breath.  Patient was brought to the ED for low oxygen saturations.  She was recently treated for pneumonia.  Here she was found to have recurrent pneumonia.  She was started on treatment for H CAP in the ED and hospitalist were called for admission.  On this writer's interview the patient she was confused and uncooperative, and unable to contribute any information to her HPI.  She also refused to cooperate with physical exam.    Psychiatry consult is requested to assist with agitated behavioral changes.  Past Psychiatric History: Dementia, uncertain of onset.  On evaluation, patient is seen in bed.  She is anxious and restless, but is redirectable and calms easily.  Nursing staff is at bedside and reports that Ativan had been administered.  When asked direct questions about her presentation to the hospital, medical history and current medications, patient becomes easily flustered stating "I do not know."  Patient's anxiety increases when she is unable to answer questions.  Patient comments when given  questions about her remote history.  She is able to state her birthdate, her age, her siblings, her husband and his birthdate as well as his age.  She is able to describe her anniversary, and her children.  Patient is not oriented to today's date or current events.  Patient is aware that she is in the hospital.  Patient is a poor historian, however she is able to express that she is scared about being alone in a hospital room.  She denies suicidal and homicidal ideation.  She denies auditory and visual hallucinations.  Risk to Self:  Nonintentional Risk to Others:  No Prior Inpatient Therapy:  No Prior Outpatient Therapy:  No  Past Medical History:  Past Medical History:  Diagnosis Date  . Anemia    low iron  . Anxiety   . Arthritis   . Bilateral cataracts   . Bronchitis    hx of   . Cataract   . Chronic kidney disease   . Depression   . Diabetes mellitus without complication ([place])   . 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 GERD (gastroesophageal reflux disease)   . Headache   . History of frequent urinary tract infections   . Hx of renal cell cancer    LEFT  . Hyperlipidemia   . Hypertension   . Numbness and tingling    right hand   . Obesity   . Osteopenia 10/05/2017  . Peripheral artery disease ([place])   . Urinary frequency   . Urinary incontinence     Past Surgical History:  Procedure Laterality  Date  . ABDOMINAL HYSTERECTOMY  1978  . CARPAL TUNNEL RELEASE Bilateral   . CHOLECYSTECTOMY    . COLONOSCOPY    . COLONOSCOPY WITH ESOPHAGOGASTRODUODENOSCOPY (EGD)    . ERCP N/A 08/19/2017   Procedure: ENDOSCOPIC RETROGRADE CHOLANGIOPANCREATOGRAPHY (ERCP);  Surgeon: Arta Silence, MD;  Location: [facility] ENDOSCOPY;  Service: Endoscopy;  Laterality: N/A;  . LEG AMPUTATION     above the knee / left   . ROBOT ASSISTED LAPAROSCOPIC NEPHRECTOMY Left 01/15/2015   Procedure: ROBOTIC ASSISTED LAPAROSCOPIC RADICAL NEPHRECTOMY;  Surgeon: Alexis Frock, MD;  Location: [facility] ORS;  Service: Urology;  Laterality: Left;  . stent placement right leg      Family History:  Family History  Problem Relation Age of Onset  . Cancer Mother        Stomach  . Cancer Father   . Diabetes Brother   . Cancer Brother        oral  . Healthy Son   . Healthy Sister    Family Psychiatric  History: Unknown Social History:  Social History   Substance and Sexual Activity  Alcohol Use Not Currently     Social History   Substance and Sexual Activity  Drug Use No    Social History   Socioeconomic History  . Marital status: Married    Spouse name: Amy [name]  . Number of children: 1  . Years of education: Not on file  . Highest education level: 10th grade  Occupational History  . Occupation: Retired  Scientific laboratory technician  . Financial resource strain: Not hard at all  . Food insecurity:    Worry: Never true    Inability: Never true  . Transportation needs:    Medical: No    Non-medical: No  Tobacco Use  . Smoking status: Former Smoker    Packs/day: 1.50    Years: 51.00    Pack years: 76.50    Types: Cigarettes    Last attempt to quit: 05/24/2004    Years since quitting: 14.1  . Smokeless tobacco: Never Used  . Tobacco comment: smoking cessation materials not required  Substance and Sexual Activity  . Alcohol use: Not Currently  . Drug use: No  . Sexual activity: Not Currently  Lifestyle  . Physical activity:    Days per week: 0 days    Minutes per session: 0 min  . Stress: Very much  Relationships  . Social connections:    Talks on phone: Patient refused    Gets together: Patient refused    Attends religious service: Patient refused    Active member of club or organization: Patient refused    Attends meetings of clubs or organizations: Patient refused    Relationship status: Married  Other Topics Concern  . Not on file  Social History Narrative  . Not on file   Additional Social History:  Patient reports that she is married  with 2 grown sons.  Allergies:   Allergies  Allergen Reactions  . Fentanyl Other (See Comments)    urine retention  . Penicillins Itching, Rash and Other (See Comments)    Has patient had a PCN reaction causing immediate rash, facial/tongue/throat swelling, SOB or lightheadedness with hypotension: Yes Has patient had a PCN reaction causing severe rash involving mucus membranes or skin necrosis: No Has patient had a PCN reaction that required hospitalization: No Has patient had a PCN reaction occurring within the last 10 years: Yes If all of the above answers are "NO", then may proceed with  Cephalosporin use.    Labs:  Results for orders placed or performed during the hospital encounter of 07/17/18 (from the past 48 hour(s))  CBC     Status: Abnormal   Collection Time: 07/17/18  5:19 PM  Result Value Ref Range   WBC 11.4 (H) 4.0 - 10.5 K/uL   RBC 4.01 3.87 - 5.11 MIL/uL   Hemoglobin 11.9 (L) 12.0 - 15.0 g/dL   HCT 38.3 36.0 - 46.0 %   MCV 95.5 80.0 - 100.0 fL   MCH 29.7 26.0 - 34.0 pg   MCHC 31.1 30.0 - 36.0 g/dL   RDW 16.6 (H) 11.5 - 15.5 %   Platelets 318 150 - 400 K/uL   nRBC 0.0 0.0 - 0.2 %    Comment: Performed at [facility], [address] [place], [place] [address]  Comprehensive metabolic panel     Status: Abnormal   Collection Time: 07/17/18  5:19 PM  Result Value Ref Range   Sodium 139 135 - 145 mmol/L   Potassium 4.1 3.5 - 5.1 mmol/L   Chloride 106 98 - 111 mmol/L   CO2 25 22 - 32 mmol/L   Glucose, Bld 97 70 - 99 mg/dL   BUN 22 8 - 23 mg/dL   Creatinine, Ser 2.20 (H) 0.44 - 1.00 mg/dL   Calcium 8.6 (L) 8.9 - 10.3 mg/dL   Total Protein 6.2 (L) 6.5 - 8.1 g/dL   Albumin 2.9 (L) 3.5 - 5.0 g/dL   AST 18 15 - 41 U/L   ALT 12 0 - 44 U/L   Alkaline Phosphatase 111 38 - 126 U/L   Total Bilirubin 1.1 0.3 - 1.2 mg/dL   GFR calc non Af Amer 20 (L) >60 mL/min   GFR calc Af Amer 24 (L) >60 mL/min   Anion gap 8 5 - 15    Comment: Performed at [facility], [address] [place], [place] [address]  Troponin I - ONCE - STAT     Status: None   Collection Time: 07/17/18  5:19 PM  Result Value Ref Range   Troponin I <0.03 <0.03 ng/mL    Comment: Performed at [facility], [address] [place], [place] [address]  Blood culture (routine x 2)     Status: None (Preliminary result)   Collection Time: 07/17/18  9:40 PM  Result Value Ref Range   Specimen Description BLOOD RIGHT ASSIST CONTROL    Special Requests      BOTTLES DRAWN AEROBIC AND ANAEROBIC Blood Culture results may not be optimal due to an excessive volume of blood received in culture bottles   Culture      NO GROWTH < 12 HOURS Performed at [facility], [address] [place], [place] [address]    Report Status PENDING   Blood culture (routine x 2)     Status: None (Preliminary result)   Collection Time: 07/17/18  9:40 PM  Result Value Ref Range   Specimen Description BLOOD LEFT ASSIST CONTROL    Special Requests      BOTTLES DRAWN AEROBIC AND ANAEROBIC Blood Culture results may not be optimal due to an excessive volume of blood received in culture bottles   Culture      NO GROWTH < 12 HOURS Performed at [facility], [address] [place], [place] [address]    Report Status PENDING   Glucose, capillary     Status: Abnormal   Collection Time: 07/18/18  2:10 AM  Result Value Ref Range   Glucose-Capillary 159 (H) 70 - 99 mg/dL  Basic metabolic panel     Status: Abnormal   Collection Time: 07/18/18  5:17 AM  Result Value Ref Range   Sodium 140 135 - 145 mmol/L   Potassium 4.4 3.5 - 5.1 mmol/L   Chloride 110 98 - 111 mmol/L   CO2 21 (L) 22 - 32 mmol/L   Glucose, Bld 175 (H) 70 - 99 mg/dL   BUN 24 (H) 8 - 23 mg/dL   Creatinine, Ser 2.01 (H) 0.44 - 1.00 mg/dL   Calcium 8.3 (L) 8.9 - 10.3 mg/dL   GFR calc non Af Amer 23 (L) >60 mL/min   GFR calc Af Amer 26 (L) >60 mL/min   Anion gap 9 5 - 15    Comment: Performed at [facility], [address] [place], [place] [address]  Glucose, capillary     Status: Abnormal   Collection Time: 07/18/18  8:10 AM  Result Value Ref Range   Glucose-Capillary 167 (H) 70 - 99 mg/dL  Glucose, capillary     Status: Abnormal   Collection Time: 07/18/18  9:43 AM  Result Value Ref Range   Glucose-Capillary 150 (H) 70 - 99 mg/dL  Glucose, capillary     Status: Abnormal   Collection Time: 07/18/18  1:47 PM  Result Value Ref Range   Glucose-Capillary 161 (H) 70 - 99 mg/dL  Glucose, capillary     Status: Abnormal   Collection Time: 07/18/18  4:36 PM  Result Value Ref Range   Glucose-Capillary 207 (H) 70 - 99 mg/dL    Current Facility-Administered Medications  Medication Dose Route Frequency Provider Last Rate Last Dose  . acetaminophen (TYLENOL) tablet 650 mg  650 mg Oral Q6H PRN Lance Coon, MD       Or  . acetaminophen (TYLENOL) suppository 650 mg  650 mg Rectal Q6H PRN Lance Coon, MD      . atorvastatin (LIPITOR) tablet 20 mg  20 mg Oral Corwin Levins, MD      . busPIRone (BUSPAR) tablet 10 mg  10 mg Oral TID Lance Coon, MD   10 mg at 07/18/18 1610  . ceFEPIme (MAXIPIME) 1 g in sodium chloride 0.9 % 100 mL IVPB  1 g Intravenous Q12H Lance Coon, MD   Stopped at 07/18/18 [phone]  . clopidogrel (PLAVIX) tablet 75 mg  75 mg Oral Daily Lance Coon, MD   75 mg at 07/18/18 1043  . famotidine (PEPCID) tablet 20 mg  20 mg Oral Corwin Levins, MD      . heparin injection 5,000 Units  5,000 Units Subcutaneous Camelia Phenes Lance Coon, MD   5,000 Units at 07/18/18 1607  . insulin aspart (novoLOG) injection 0-5 Units  0-5 Units Subcutaneous QHS Lance Coon, MD      . insulin aspart (novoLOG) injection 0-9 Units  0-9 Units Subcutaneous TID WC Lance Coon, MD   3 Units at 07/18/18 1730  . ipratropium-albuterol (DUONEB) 0.5-2.5 (3) MG/3ML nebulizer solution 3 mL  3 mL Nebulization Q4H PRN Lance Coon, MD   3 mL at 07/18/18 1018  . methylPREDNISolone sodium succinate  (SOLU-MEDROL) 125 mg/2 mL injection 60 mg  60 mg Intravenous Q6H Lance Coon, MD   60 mg at 07/18/18 1608  . prochlorperazine (COMPAZINE) injection 10 mg  10 mg Intravenous Q6H PRN Lance Coon, MD      . [name] ON 07/19/2018] vancomycin (VANCOCIN) IVPB 750 mg/150 ml premix  750  mg Intravenous Q36H Lance Coon, MD        Musculoskeletal: Strength & Muscle Tone: within normal limits Gait & Station: Unable to assess, patient in bed Patient leans: N/A  Psychiatric Specialty Exam: Physical Exam  Nursing note and vitals reviewed. Constitutional: She is oriented to person, place, and time. She appears well-developed and well-nourished. She appears distressed.  HENT:  Head: Normocephalic and atraumatic.  Eyes: EOM are normal.  Neck: Normal range of motion.  Cardiovascular: Normal rate.  Respiratory: Effort normal. No respiratory distress.  Musculoskeletal: Normal range of motion.  Neurological: She is alert and oriented to person, place, and time.    Review of Systems  Constitutional: Negative.   HENT: Negative.   Respiratory: Negative.   Cardiovascular: Negative.   Gastrointestinal: Negative.   Musculoskeletal: Negative.   Neurological: Negative.   Psychiatric/Behavioral: Positive for depression and memory loss. Negative for hallucinations, substance abuse and suicidal ideas. The patient is nervous/anxious. The patient does not have insomnia.     Blood pressure (!) 105/51, pulse 88, temperature 97.8 F (36.6 C), temperature source Oral, resp. rate 18, height 5\' 2"  (1.575 m), weight 71.7 kg, SpO2 93 %.Body mass index is 28.9 kg/m.  General Appearance: Fairly Groomed  Eye Contact:  Good  Speech:  Clear and Coherent and Pressured  Volume:  Normal  Mood:  Anxious  Affect:  Congruent  Thought Process:  Goal Directed and Descriptions of Associations: Tangential  Orientation:  Other:  Not oriented to date or current events.  Thought Content:  Paranoid Ideation, Rumination and  Tangential  Suicidal Thoughts:  No  Homicidal Thoughts:  No  Memory:  Immediate;   Fair Recent;   Poor Remote;   Good  Judgement:  Impaired  Insight:  Lacking  Psychomotor Activity:  Restlessness  Concentration:  Concentration: Fair  Recall:  Poor  Fund of Knowledge:  Fair  Language:  Good  Akathisia:  No  Handed:  Right  AIMS (if indicated):     Assets:  Desire for Improvement Housing Social Support  ADL's:  Impaired  Cognition:  Impaired,  Moderate  Sleep:   Unable to assess     Treatment Plan Summary: Amy [name] is a 83 y.o. female who presented with recurrent pneumonia.  Patient is highly anxious with concerns for an underlying dementia with behavioral changes.  Family is not available for collateral.  Will recommend Haldol at low dose to be administered for agitation overnight which would put patient at risk of harming herself or others.  We will follow-up in the morning to assess the patient required medications and or sitter at bedside.  We will also seek collateral with family when they are available to better ascertain psychiatric needs. Daily contact with patient to assess and evaluate symptoms and progress in treatment and Medication management  Disposition: Patient does not meet criteria for psychiatric inpatient admission. Supportive therapy provided about ongoing stressors.  Lavella Hammock, MD 07/18/2018 9:24 PM

## 2018-07-18 NOTE — ED Notes (Addendum)
Pt hard to arouse; pt arouses to painful stimulation. This RN and Rn Vicente Males and [place] RN at bedside. Sternum rub performed with minimal response. Pt quickly falls asleep. Admitting MD paged

## 2018-07-18 NOTE — ED Notes (Signed)
Husband reports pt is at her mental baseline.

## 2018-07-18 NOTE — ED Notes (Signed)
Husband at bedside.  

## 2018-07-18 NOTE — H&P (Signed)
[place] [facility] [place] NAME: Amy [name]    MR#:  [id number]  DATE OF BIRTH:  [birth year]  DATE OF ADMISSION:  07/17/2018  PRIMARY CARE PHYSICIAN: Arnetha Courser, MD   REQUESTING/REFERRING PHYSICIAN: Kerman Passey, MD  CHIEF COMPLAINT:   Chief Complaint  Patient presents with  . Shortness of Breath    HISTORY OF PRESENT ILLNESS:  Amy [name]  is a 83 y.o. female who presents with chief complaint as above.  Patient was brought to the ED for low oxygen saturations.  She was recently treated for pneumonia.  Here she was found to have recurrent pneumonia.  She was started on treatment for H CAP in the ED and hospitalist were called for admission.  On this writer's interview the patient she was confused and uncooperative, and unable to contribute any information to her HPI.  She also refused to cooperate with physical exam.    PAST MEDICAL HISTORY:   Past Medical History:  Diagnosis Date  . Anemia    low iron  . Anxiety   . Arthritis   . Bilateral cataracts   . Bronchitis    hx of   . Cataract   . Chronic kidney disease   . Depression   . Diabetes mellitus without complication ([place])   . 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 GERD (gastroesophageal reflux disease)   . Headache   . History of frequent urinary tract infections   . Hx of renal cell cancer    LEFT  . Hyperlipidemia   . Hypertension   . Numbness and tingling    right hand   . Obesity   . Osteopenia 10/05/2017  . Peripheral artery disease ([place])   . Urinary frequency   . Urinary incontinence      PAST SURGICAL HISTORY:   Past Surgical History:  Procedure Laterality Date  . ABDOMINAL HYSTERECTOMY  1978  . CARPAL TUNNEL RELEASE Bilateral   . CHOLECYSTECTOMY    . COLONOSCOPY    . COLONOSCOPY WITH ESOPHAGOGASTRODUODENOSCOPY (EGD)    . ERCP N/A 08/19/2017   Procedure: ENDOSCOPIC RETROGRADE  CHOLANGIOPANCREATOGRAPHY (ERCP);  Surgeon: Arta Silence, MD;  Location: [facility] ENDOSCOPY;  Service: Endoscopy;  Laterality: N/A;  . LEG AMPUTATION     above the knee / left   . ROBOT ASSISTED LAPAROSCOPIC NEPHRECTOMY Left 01/15/2015   Procedure: ROBOTIC ASSISTED LAPAROSCOPIC RADICAL NEPHRECTOMY;  Surgeon: Alexis Frock, MD;  Location: [facility] ORS;  Service: Urology;  Laterality: Left;  . stent placement right leg        SOCIAL HISTORY:   Social History   Tobacco Use  . Smoking status: Former Smoker    Packs/day: 1.50    Years: 51.00    Pack years: 76.50    Types: Cigarettes    Last attempt to quit: 05/24/2004    Years since quitting: 14.1  . Smokeless tobacco: Never Used  . Tobacco comment: smoking cessation materials not required  Substance Use Topics  . Alcohol use: Not Currently     FAMILY HISTORY:   Family History  Problem Relation Age of Onset  . Cancer Mother        Stomach  . Cancer Father   . Diabetes Brother   . Cancer Brother        oral  . Healthy Son   . Healthy Sister      DRUG ALLERGIES:  Allergies  Allergen Reactions  . Fentanyl Other (See Comments)    urine retention  . Penicillins Itching, Rash and Other (See Comments)    Has patient had a PCN reaction causing immediate rash, facial/tongue/throat swelling, SOB or lightheadedness with hypotension: Yes Has patient had a PCN reaction causing severe rash involving mucus membranes or skin necrosis: No Has patient had a PCN reaction that required hospitalization: No Has patient had a PCN reaction occurring within the last 10 years: Yes If all of the above answers are "NO", then may proceed with Cephalosporin use.    MEDICATIONS AT HOME:   Prior to Admission medications   Medication Sig Start Date End Date Taking? Authorizing Provider  acetaminophen (TYLENOL) 500 MG tablet Take 1,000 mg by mouth every 6 (six) hours as needed for mild pain, fever or headache.     [provider]  albuterol  (PROVENTIL HFA;VENTOLIN HFA) 108 (90 Base) MCG/ACT inhaler Inhale 2 puffs into the lungs every 6 (six) hours as needed for wheezing or shortness of breath. Patient not taking: Reported on 07/12/2018 06/15/17   Roselee Nova, MD  atorvastatin (LIPITOR) 20 MG tablet TAKE 1 TABLET (20 MG TOTAL) BY MOUTH AT BEDTIME. 07/12/18   Lada, Satira Anis, MD  busPIRone (BUSPAR) 10 MG tablet Take 10 mg by mouth 3 (three) times daily.    [provider]  clopidogrel (PLAVIX) 75 MG tablet Take 1 tablet (75 mg total) by mouth daily. 07/28/17   Algernon Huxley, MD  donepezil (ARICEPT) 10 MG tablet Take 1 tablet (10 mg total) by mouth at bedtime. 10/06/17   Arnetha Courser, MD  famotidine (PEPCID) 20 MG tablet Take 1 tablet (20 mg total) by mouth at bedtime. To help prevent heartburn, reflux 04/05/18   Lada, Satira Anis, MD  furosemide (LASIX) 20 MG tablet Take 40 mg by mouth daily.  09/02/17   [provider]  gabapentin (NEURONTIN) 300 MG capsule Take 1 capsule (300 mg total) by mouth 3 (three) times daily. 06/21/18   Gillis Santa, MD  HYDROcodone-acetaminophen (NORCO) 7.5-325 MG tablet Take 1 tablet by mouth every 8 (eight) hours as needed for up to 30 days. For chronic pain.  To fill on or after: 06-21-2018, 07-21-2018, 08-20-2018 07/21/18 08/20/18  Gillis Santa, MD  ipratropium-albuterol (DUONEB) 0.5-2.5 (3) MG/3ML SOLN INHALE THE CONTENTS OF 1 VIAL VIA NEBULIZER THREE TIMES DAILY AS DIRECTED 11/10/17   Laverle Hobby, MD  metoprolol tartrate (LOPRESSOR) 50 MG tablet Take 1 tablet (50 mg total) by mouth 2 (two) times daily. 06/15/17   Roselee Nova, MD  QUEtiapine (SEROQUEL) 50 MG tablet Take 50 mg by mouth at bedtime. 10/27/17   [provider]  Zinc Oxide (DESITIN RAPID RELIEF) 13 % CREA Apply 1 application topically 3 (three) times daily as needed. 04/25/18   Gregor Hams, MD    REVIEW OF SYSTEMS:  Review of Systems  Unable to perform ROS: Other    Patient uncooperative, see  HPI VITAL SIGNS:   Vitals:   07/17/18 2030 07/17/18 2245 07/17/18 2344 07/18/18 0100  BP: 117/62 (!) 143/100 (!) 102/56 (!) 100/46  Pulse: 74 88 88 83  Resp: 16 15 (!) 21 (!) 23  Temp:      TempSrc:      SpO2: 93% 97% 93% 92%  Weight:      Height:       Wt Readings from Last 3 Encounters:  07/17/18 71.7 kg  07/12/18 72 kg  07/02/18 73.6 kg    PHYSICAL EXAMINATION:  Physical Exam  Vitals reviewed. Constitutional: She appears well-developed and well-nourished.  HENT:  Head: Normocephalic and atraumatic.  Mouth/Throat: Oropharynx is clear and moist.  Eyes: Pupils are equal, round, and reactive to light. Conjunctivae and EOM are normal. No scleral icterus.  Neck: Normal range of motion. No thyromegaly present.  Cardiovascular: Normal rate and regular rhythm.  Respiratory: No respiratory distress.  Musculoskeletal: Normal range of motion.     Comments: No arthritis, no gout  Neurological: She is alert.  Unable to fully assess as the patient is uncooperative  Psychiatric:  Unable to assess as the patient is uncooperative   Physical exam is largely limited due to the patient's confusion and how uncooperative she is, refusing even to allow auscultation LABORATORY PANEL:   CBC Recent Labs  Lab 07/17/18 1719  WBC 11.4*  HGB 11.9*  HCT 38.3  PLT 318   ------------------------------------------------------------------------------------------------------------------  Chemistries  Recent Labs  Lab 07/17/18 1719  NA 139  K 4.1  CL 106  CO2 25  GLUCOSE 97  BUN 22  CREATININE 2.20*  CALCIUM 8.6*  AST 18  ALT 12  ALKPHOS 111  BILITOT 1.1   ------------------------------------------------------------------------------------------------------------------  Cardiac Enzymes Recent Labs  Lab 07/17/18 1719  TROPONINI <0.03   ------------------------------------------------------------------------------------------------------------------  RADIOLOGY:  Dg Chest 2  View  Result Date: 07/17/2018 CLINICAL DATA:  Shortness of breath EXAM: CHEST - 2 VIEW COMPARISON:  07/02/2018 FINDINGS: Linear atelectasis or scarring at the left base. Right lung clear. Heart is borderline in size. No effusions or acute bony abnormality. IMPRESSION: Left basilar scarring or atelectasis.  No active disease. Electronically Signed   By: Rolm Baptise M.D.   On: 07/17/2018 18:39   Ct Chest Wo Contrast  Result Date: 07/17/2018 CLINICAL DATA:  Hypoxemia EXAM: CT CHEST WITHOUT CONTRAST TECHNIQUE: Multidetector CT imaging of the chest was performed following the standard protocol without IV contrast. COMPARISON:  Chest x-ray earlier today.  Chest CT 05/26/2018 FINDINGS: Cardiovascular: Moderate coronary artery and aortic calcifications. No aneurysm. Heart is normal size. Mediastinum/Nodes: Mildly prominent mediastinal lymph nodes are stable since recent study. The largest is a low right paratracheal lymph node with a short axis diameter of 14 mm. Higher right paratracheal lymph node has a short axis diameter of 11 mm. These are stable since recent study. No axillary or visible hilar adenopathy. Lungs/Pleura: Emphysema. Medial left upper lobe nodule as a spiculated appearance and measures 13 x 9 mm compared to 12 x 8 mm previously, likely not significantly changed. Posterior left upper lobe nodule on image 51 measures 5 mm and is stable. Left lower lobe nodule measures 8 mm, stable. Rounded airspace opacity posteriorly in the right lower lobe, favor rounded atelectasis. Left base atelectasis or scarring is similar to prior study. Rounded subpleural nodule in the anterior right upper lobe measures 12 mm, stable. Trace right pleural effusion. Upper Abdomen: Imaging into the upper abdomen shows no acute findings. Prior cholecystectomy. Pneumobilia noted in the left hepatic lobe. Musculoskeletal: Chest wall soft tissues are unremarkable. Compression deformity at T12 is stable. No acute bony abnormality.  IMPRESSION: Stable nodules in the lungs bilaterally. New rounded posterior right lower lobe opacity could reflect rounded atelectasis or pneumonia. Trace right pleural effusion. Stable mildly enlarged mediastinal lymph nodes. Moderate coronary artery disease. Aortic Atherosclerosis (ICD10-I70.0) and Emphysema (ICD10-J43.9). Electronically Signed   By: Rolm Baptise M.D.   On: 07/17/2018 21:13    EKG:   Orders placed or  performed during the hospital encounter of 07/17/18  . ED EKG  . ED EKG    IMPRESSION AND PLAN:  Principal Problem:   HCAP (healthcare-associated pneumonia) -IV antibiotics, PRN supportive treatment Active Problems:   COPD exacerbation (HCC) -IV Solu-Medrol, other supportive treatment as above, continue home inhalers   Diabetes mellitus type 2, controlled (HCC) -sliding scale insulin coverage   Anxiety -home dose anxiolytic   GERD (gastroesophageal reflux disease) -home dose PPI   CKD (chronic kidney disease) stage 4, GFR 15-29 ml/min (HCC) -at baseline, avoid nephrotoxins and monitor   Hyperlipidemia -home dose antilipid  Chart review performed and case discussed with ED provider. Labs, imaging and/or ECG reviewed by provider and discussed with patient/family. Management plans discussed with the patient and/or family.  DVT PROPHYLAXIS: SubQ lovenox   GI PROPHYLAXIS:  PPI   ADMISSION STATUS: Inpatient     CODE STATUS: Full Code Status History    Date Active Date Inactive Code Status Order ID Comments User Context   07/02/2018 [id number] 07/03/2018 1605 Full Code [id number]  Demetrios Loll, MD Inpatient   08/13/2017 0332 08/15/2017 1824 Full Code [id number]  Lance Coon, MD Inpatient   01/15/2015 2127 01/17/2015 1541 Full Code [id number]  Star Age, MD Inpatient    Advance Directive Documentation     Most Recent Value  Type of Advance Directive  Healthcare Power of Attorney  Pre-existing out of facility DNR order (yellow form or pink MOST form)  -  "MOST" Form in Place?  -       TOTAL TIME TAKING CARE OF THIS PATIENT: 45 minutes.   Ethlyn Daniels 07/18/2018, 1:05 AM  [facility]  Office  [phone]  CC: Primary care physician; Arnetha Courser, MD  Note:  This document was prepared using Dragon voice recognition software and may include unintentional dictation errors.

## 2018-07-18 NOTE — Care Management Note (Signed)
Case Management Note  Patient Details  Name: MY RINKE MRN: [id number] Date of Birth: 1935-07-29  Subjective/Objective:                 Patient with recent discharge after stay for exac of copd/pneumonia. She presents to ER with shortness of breath. Home Health staff were in the home and patient's oxygen sats on room air in the 70's - 80's. Nebulizer treatments administered in the home did not resolve symptoms.  Patient does not have home oxygen. Her husband is her primary caregiver and is also followed by [facility]. Currently receiving RN PT SW through [facility].  Agency is aware of of admission   Action/Plan:  Will need home 02 assessment prior to discharge  Expected Discharge Date:                  Expected Discharge Plan:     In-House Referral:  [facility]  Discharge planning Services  CM Consult  Post Acute Care Choice:  Resumption of Svcs/PTA Provider, Home Health Choice offered to:     DME Arranged:    DME Agency:     HH Arranged:  RN PT SW [place] Agency:  [facility] [facility]  Status of Service:  In process, will continue to follow  If discussed at Long Length of Stay Meetings, dates discussed:    Additional Comments:  Katrina Stack, RN 07/18/2018, 7:06 PM

## 2018-07-18 NOTE — ED Notes (Signed)
Pt assisted with eating lunch

## 2018-07-19 ENCOUNTER — Telehealth: Payer: Self-pay

## 2018-07-19 DIAGNOSIS — R4189 Other symptoms and signs involving cognitive functions and awareness: Secondary | ICD-10-CM

## 2018-07-19 DIAGNOSIS — J189 Pneumonia, unspecified organism: Principal | ICD-10-CM

## 2018-07-19 DIAGNOSIS — E1122 Type 2 diabetes mellitus with diabetic chronic kidney disease: Secondary | ICD-10-CM | POA: Diagnosis not present

## 2018-07-19 DIAGNOSIS — F0390 Unspecified dementia without behavioral disturbance: Secondary | ICD-10-CM | POA: Diagnosis not present

## 2018-07-19 DIAGNOSIS — J181 Lobar pneumonia, unspecified organism: Secondary | ICD-10-CM | POA: Diagnosis not present

## 2018-07-19 DIAGNOSIS — J441 Chronic obstructive pulmonary disease with (acute) exacerbation: Secondary | ICD-10-CM | POA: Diagnosis not present

## 2018-07-19 DIAGNOSIS — F419 Anxiety disorder, unspecified: Secondary | ICD-10-CM

## 2018-07-19 DIAGNOSIS — C642 Malignant neoplasm of left kidney, except renal pelvis: Secondary | ICD-10-CM | POA: Diagnosis not present

## 2018-07-19 DIAGNOSIS — N183 Chronic kidney disease, stage 3 (moderate): Secondary | ICD-10-CM | POA: Diagnosis not present

## 2018-07-19 DIAGNOSIS — I129 Hypertensive chronic kidney disease with stage 1 through stage 4 chronic kidney disease, or unspecified chronic kidney disease: Secondary | ICD-10-CM | POA: Diagnosis not present

## 2018-07-19 DIAGNOSIS — J029 Acute pharyngitis, unspecified: Secondary | ICD-10-CM | POA: Diagnosis not present

## 2018-07-19 DIAGNOSIS — R4689 Other symptoms and signs involving appearance and behavior: Secondary | ICD-10-CM

## 2018-07-19 DIAGNOSIS — J440 Chronic obstructive pulmonary disease with acute lower respiratory infection: Secondary | ICD-10-CM | POA: Diagnosis not present

## 2018-07-19 LAB — GLUCOSE, CAPILLARY
Glucose-Capillary: 153 mg/dL — ABNORMAL HIGH (ref 70–99)
Glucose-Capillary: 154 mg/dL — ABNORMAL HIGH (ref 70–99)
Glucose-Capillary: 155 mg/dL — ABNORMAL HIGH (ref 70–99)
Glucose-Capillary: 172 mg/dL — ABNORMAL HIGH (ref 70–99)

## 2018-07-19 LAB — CREATININE, SERUM
Creatinine, Ser: 1.73 mg/dL — ABNORMAL HIGH (ref 0.44–1.00)
GFR calc Af Amer: 32 mL/min — ABNORMAL LOW (ref >60)
GFR calc non Af Amer: 27 mL/min — ABNORMAL LOW (ref >60)

## 2018-07-19 LAB — MRSA PCR SCREENING: MRSA by PCR: NEGATIVE

## 2018-07-19 MED ORDER — FLUOXETINE HCL 10 MG PO CAPS
10.0000 mg | ORAL_CAPSULE | Freq: Every day | ORAL | Status: DC
  Administered 2018-07-19 – 2018-07-21 (×3): 10 mg via ORAL
  Filled 2018-07-19 (×3): qty 1

## 2018-07-19 MED ORDER — SODIUM CHLORIDE 0.9 % IV SOLN
INTRAVENOUS | Status: DC | PRN
  Administered 2018-07-19 – 2018-07-20 (×2): 250 mL via INTRAVENOUS

## 2018-07-19 MED ORDER — HALOPERIDOL 2 MG PO TABS
2.0000 mg | ORAL_TABLET | Freq: Three times a day (TID) | ORAL | Status: DC | PRN
  Filled 2018-07-19: qty 1

## 2018-07-19 MED ORDER — LORAZEPAM 2 MG/ML IJ SOLN
0.5000 mg | Freq: Four times a day (QID) | INTRAMUSCULAR | Status: DC | PRN
  Administered 2018-07-19 – 2018-07-20 (×2): 0.5 mg via INTRAVENOUS
  Filled 2018-07-19 (×3): qty 1

## 2018-07-19 MED ORDER — HALOPERIDOL LACTATE 5 MG/ML IJ SOLN
2.0000 mg | Freq: Three times a day (TID) | INTRAMUSCULAR | Status: DC | PRN
  Administered 2018-07-19: 2 mg via INTRAVENOUS
  Filled 2018-07-19: qty 1

## 2018-07-19 MED ORDER — LORAZEPAM 0.5 MG PO TABS
0.5000 mg | ORAL_TABLET | Freq: Three times a day (TID) | ORAL | Status: DC
  Administered 2018-07-19 – 2018-07-20 (×2): 0.5 mg via ORAL
  Filled 2018-07-19 (×2): qty 1

## 2018-07-19 MED ORDER — QUETIAPINE FUMARATE 25 MG PO TABS
25.0000 mg | ORAL_TABLET | Freq: Once | ORAL | Status: AC
  Administered 2018-07-19: 25 mg via ORAL
  Filled 2018-07-19: qty 1

## 2018-07-19 NOTE — Telephone Encounter (Signed)
Copied from [place] [id number]. Topic: Referral - Status >> Jul 19, 2018  6:62 PM Simone Curia D wrote: 9/47/6546 Left msg on VM with my name and number to return my call.  Will attempt to call again 07/20/2018. MA

## 2018-07-19 NOTE — Progress Notes (Signed)
[place] at [place] NAME: Amy [name]    MR#:  [id number]  DATE OF BIRTH:  11/08/1935  SUBJECTIVE:  CHIEF COMPLAINT:  Pt is somewhat less agitated today   REVIEW OF SYSTEMS:  Unobtainable as the patient is agitated  DRUG ALLERGIES:   Allergies  Allergen Reactions  . Fentanyl Other (See Comments)    urine retention  . Penicillins Itching, Rash and Other (See Comments)    Has patient had a PCN reaction causing immediate rash, facial/tongue/throat swelling, SOB or lightheadedness with hypotension: Yes Has patient had a PCN reaction causing severe rash involving mucus membranes or skin necrosis: No Has patient had a PCN reaction that required hospitalization: No Has patient had a PCN reaction occurring within the last 10 years: Yes If all of the above answers are "NO", then may proceed with Cephalosporin use.    VITALS:  Blood pressure (!) 92/51, pulse 100, temperature 98.8 F (37.1 C), temperature source Oral, resp. rate 18, height 5\' 2"  (1.575 m), weight 71.7 kg, SpO2 96 %.  PHYSICAL EXAMINATION:  GENERAL:  83 y.o.-year-old patient lying in the bed with no acute distress.  EYES: Pupils equal, round, reactive to light and accommodation. No scleral icterus. Extraocular muscles intact.  HEENT: Head atraumatic, normocephalic. Oropharynx and nasopharynx clear.  NECK:  Supple, no jugular venous distention. No thyroid enlargement, no tenderness.  LUNGS: Normal breath sounds bilaterally, no wheezing, rales,rhonchi or crepitation. No use of accessory muscles of respiration.  CARDIOVASCULAR: S1, S2 normal. No murmurs, rubs, or gallops.  ABDOMEN: Soft, nontender, nondistended. Bowel sounds present.[name]  EXTREMITIES: No pedal edema, cyanosis, or clubbing.  NEUROLOGIC: Awake, alert and very agitated and belligerent sensation intact. Gait not checked.  PSYCHIATRIC: The patient is alert and oriented x 1-2, agitated SKIN: No obvious rash,  lesion, or ulcer.    LABORATORY PANEL:   CBC Recent Labs  Lab 07/17/18 1719  WBC 11.4*  HGB 11.9*  HCT 38.3  PLT 318   ------------------------------------------------------------------------------------------------------------------  Chemistries  Recent Labs  Lab 07/17/18 1719 07/18/18 0517 07/19/18 0816  NA 139 140  --   K 4.1 4.4  --   CL 106 110  --   CO2 25 21*  --   GLUCOSE 97 175*  --   BUN 22 24*  --   CREATININE 2.20* 2.01* 1.73*  CALCIUM 8.6* 8.3*  --   AST 18  --   --   ALT 12  --   --   ALKPHOS 111  --   --   BILITOT 1.1  --   --    ------------------------------------------------------------------------------------------------------------------  Cardiac Enzymes Recent Labs  Lab 07/17/18 1719  TROPONINI <0.03   ------------------------------------------------------------------------------------------------------------------  RADIOLOGY:  Dg Chest 2 View  Result Date: 07/17/2018 CLINICAL DATA:  Shortness of breath EXAM: CHEST - 2 VIEW COMPARISON:  07/02/2018 FINDINGS: Linear atelectasis or scarring at the left base. Right lung clear. Heart is borderline in size. No effusions or acute bony abnormality. IMPRESSION: Left basilar scarring or atelectasis.  No active disease. Electronically Signed   By: Rolm Baptise M.D.   On: 07/17/2018 18:39   Ct Chest Wo Contrast  Result Date: 07/17/2018 CLINICAL DATA:  Hypoxemia EXAM: CT CHEST WITHOUT CONTRAST TECHNIQUE: Multidetector CT imaging of the chest was performed following the standard protocol without IV contrast. COMPARISON:  Chest x-ray earlier today.  Chest CT 05/26/2018 FINDINGS: Cardiovascular: Moderate coronary artery and aortic calcifications. No aneurysm. Heart is normal size. Mediastinum/Nodes:  Mildly prominent mediastinal lymph nodes are stable since recent study. The largest is a low right paratracheal lymph node with a short axis diameter of 14 mm. Higher right paratracheal lymph node has a short axis  diameter of 11 mm. These are stable since recent study. No axillary or visible hilar adenopathy. Lungs/Pleura: Emphysema. Medial left upper lobe nodule as a spiculated appearance and measures 13 x 9 mm compared to 12 x 8 mm previously, likely not significantly changed. Posterior left upper lobe nodule on image 51 measures 5 mm and is stable. Left lower lobe nodule measures 8 mm, stable. Rounded airspace opacity posteriorly in the right lower lobe, favor rounded atelectasis. Left base atelectasis or scarring is similar to prior study. Rounded subpleural nodule in the anterior right upper lobe measures 12 mm, stable. Trace right pleural effusion. Upper Abdomen: Imaging into the upper abdomen shows no acute findings. Prior cholecystectomy. Pneumobilia noted in the left hepatic lobe. Musculoskeletal: Chest wall soft tissues are unremarkable. Compression deformity at T12 is stable. No acute bony abnormality. IMPRESSION: Stable nodules in the lungs bilaterally. New rounded posterior right lower lobe opacity could reflect rounded atelectasis or pneumonia. Trace right pleural effusion. Stable mildly enlarged mediastinal lymph nodes. Moderate coronary artery disease. Aortic Atherosclerosis (ICD10-I70.0) and Emphysema (ICD10-J43.9). Electronically Signed   By: Rolm Baptise M.D.   On: 07/17/2018 21:13    EKG:   Orders placed or performed during the hospital encounter of 07/17/18  . ED EKG  . ED EKG  . EKG 12-Lead    ASSESSMENT AND PLAN:     HCAP (healthcare-associated pneumonia) - CT chest with new right-sided opacity Continue IV antibiotics cefepime and discontinue vancomycin as MRSA PCR negative PRN supportive treatment    COPD exacerbation (HCC) -IV Solu-Medrol, other supportive treatment as above, continue home inhalers  Acute psychosis could be from healthcare associated pneumonia Ativan as needed x1.  Family requesting Ativan as patient responded well to [facility] consult placed-Dr. Leverne Humbles has  seen the patient.  Haldol and Seroquel added to the regimen  Diabetes mellitus type 2, controlled ([place]) -sliding scale insulin coverage    Anxiety -home dose anxiolytic BuSpar    GERD (gastroesophageal reflux disease) - PPI    CKD (chronic kidney disease) stage 4, GFR 15-29 ml/min (HCC) -at baseline, avoid nephrotoxins and monitor   Generalized weakness physical therapy   All the records are reviewed and case discussed with Care Management/Social Workerr. Management plans discussed with the patient, family and they are in agreement.  CODE STATUS: fc  TOTAL TIME TAKING CARE OF THIS PATIENT: 72minutes.   POSSIBLE D/C IN 2- DAYS, DEPENDING ON CLINICAL CONDITION.  Note: This dictation was prepared with Dragon dictation along with smaller phrase technology. Any transcriptional errors that result from this process are unintentional.   Nicholes Mango M.D on 07/19/2018 at 4:39 PM  Between 7am to 6pm - Pager - [phone] After 6pm go to [email or web address] - password [email or web address] [facility]  Office  [phone]  CC: Primary care physician; Arnetha Courser, MD

## 2018-07-19 NOTE — Consult Note (Signed)
[facility]iatry Consult   Reason for Consult: Change in mental status, behavioral issues with dementia Referring Physician:  Dr. Margaretmary Eddy Patient Identification: Amy [name] MRN:  [id number] Principal Diagnosis: HCAP (healthcare-associated pneumonia) Diagnosis:  Principal Problem:   HCAP (healthcare-associated pneumonia) Active Problems:   Anxiety   Diabetes mellitus type 2, controlled ([place])   GERD (gastroesophageal reflux disease)   GAD (generalized anxiety disorder)   CKD (chronic kidney disease) stage 4, GFR 15-29 ml/min ([place])   Hyperlipidemia   COPD exacerbation ([place])  Patient is seen, chart is reviewed.  Case reviewed with nursing staff present at bedside, and patient's attending physician.  Family not available for collateral at time of assessment. Total Time spent with patient: 35 minutes  Subjective: "I do not know."  HPI:  Amy [name] is a 83 y.o. female patient admitted with shortness of breath.  Patient was brought to the ED for low oxygen saturations.  She was recently treated for pneumonia.  Here she was found to have recurrent pneumonia.  She was started on treatment for H CAP in the ED and hospitalist were called for admission.  On this writer's interview the patient she was confused and uncooperative, and unable to contribute any information to her HPI.  She also refused to cooperate with physical exam.    Psychiatry consult is requested to assist with agitated behavioral changes.  Patient is seen for follow-up.  Past Psychiatric History: Dementia, uncertain of onset.  On evaluation, patient is seen in bed.  She is anxious and restless, but is redirectable and calms easily.  Nursing staff is at bedside and reports that Haldol and Ativan have been minimally effective in reducing patient's agitation.  Patient does however seem more communicative and aware today.  She continues to be disoriented to date and time.  She is not aware of person other  than self.  Patient requires consistent reassurance and is dependent on others to do things for her.  When instructed she is able to use her television remote, she is insistent that she cannot do it, but with encouragement she is able to, however she still has self-doubt.  Patient required encouragement to eat, but ordered 1/2 gallon of sweet tea.  She is brought 1/2 gallon of unsweetened tea and states that that is not what she ordered and that is not what she wants.  She is not happy to learn that she is on any heart healthy diet and may not have sweet tea. She denies suicidal and homicidal ideation.  She denies auditory and visual hallucinations.  Risk to Self:  Nonintentional secondary to agitation. Risk to Others:  No Prior Inpatient Therapy:  No Prior Outpatient Therapy:  No  Past Medical History:  Past Medical History:  Diagnosis Date  . Anemia    low iron  . Anxiety   . Arthritis   . Bilateral cataracts   . Bronchitis    hx of   . Cataract   . Chronic kidney disease   . Depression   . Diabetes mellitus without complication ([place])   . 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 GERD (gastroesophageal reflux disease)   . Headache   . History of frequent urinary tract infections   . Hx of renal cell cancer    LEFT  . Hyperlipidemia   . Hypertension   . Numbness and tingling    right hand   . Obesity   .  Osteopenia 10/05/2017  . Peripheral artery disease ([place])   . Urinary frequency   . Urinary incontinence     Past Surgical History:  Procedure Laterality Date  . ABDOMINAL HYSTERECTOMY  1978  . CARPAL TUNNEL RELEASE Bilateral   . CHOLECYSTECTOMY    . COLONOSCOPY    . COLONOSCOPY WITH ESOPHAGOGASTRODUODENOSCOPY (EGD)    . ERCP N/A 08/19/2017   Procedure: ENDOSCOPIC RETROGRADE CHOLANGIOPANCREATOGRAPHY (ERCP);  Surgeon: Arta Silence, MD;  Location: [facility] ENDOSCOPY;  Service: Endoscopy;  Laterality: N/A;  . LEG AMPUTATION     above  the knee / left   . ROBOT ASSISTED LAPAROSCOPIC NEPHRECTOMY Left 01/15/2015   Procedure: ROBOTIC ASSISTED LAPAROSCOPIC RADICAL NEPHRECTOMY;  Surgeon: Alexis Frock, MD;  Location: [facility] ORS;  Service: Urology;  Laterality: Left;  . stent placement right leg      Family History:  Family History  Problem Relation Age of Onset  . Cancer Mother        Stomach  . Cancer Father   . Diabetes Brother   . Cancer Brother        oral  . Healthy Son   . Healthy Sister    Family Psychiatric  History: Unknown Social History:  Social History   Substance and Sexual Activity  Alcohol Use Not Currently     Social History   Substance and Sexual Activity  Drug Use No    Social History   Socioeconomic History  . Marital status: Married    Spouse name: [name]  . Number of children: 1  . Years of education: Not on file  . Highest education level: 10th grade  Occupational History  . Occupation: Retired  Scientific laboratory technician  . Financial resource strain: Not hard at all  . Food insecurity:    Worry: Never true    Inability: Never true  . Transportation needs:    Medical: No    Non-medical: No  Tobacco Use  . Smoking status: Former Smoker    Packs/day: 1.50    Years: 51.00    Pack years: 76.50    Types: Cigarettes    Last attempt to quit: 05/24/2004    Years since quitting: 14.1  . Smokeless tobacco: Never Used  . Tobacco comment: smoking cessation materials not required  Substance and Sexual Activity  . Alcohol use: Not Currently  . Drug use: No  . Sexual activity: Not Currently  Lifestyle  . Physical activity:    Days per week: 0 days    Minutes per session: 0 min  . Stress: Very much  Relationships  . Social connections:    Talks on phone: Patient refused    Gets together: Patient refused    Attends religious service: Patient refused    Active member of club or organization: Patient refused    Attends meetings of clubs or organizations: Patient refused    Relationship status:  Married  Other Topics Concern  . Not on file  Social History Narrative  . Not on file   Additional Social History:  Patient reports that she is married with 2 grown sons.  Allergies:   Allergies  Allergen Reactions  . Fentanyl Other (See Comments)    urine retention  . Penicillins Itching, Rash and Other (See Comments)    Has patient had a PCN reaction causing immediate rash, facial/tongue/throat swelling, SOB or lightheadedness with hypotension: Yes Has patient had a PCN reaction causing severe rash involving mucus membranes or skin necrosis: No Has patient had a PCN reaction that  required hospitalization: No Has patient had a PCN reaction occurring within the last 10 years: Yes If all of the above answers are "NO", then may proceed with Cephalosporin use.    Labs:  Results for orders placed or performed during the hospital encounter of 07/17/18 (from the past 48 hour(s))  CBC     Status: Abnormal   Collection Time: 07/17/18  5:19 PM  Result Value Ref Range   WBC 11.4 (H) 4.0 - 10.5 K/uL   RBC 4.01 3.87 - 5.11 MIL/uL   Hemoglobin 11.9 (L) 12.0 - 15.0 g/dL   HCT 38.3 36.0 - 46.0 %   MCV 95.5 80.0 - 100.0 fL   MCH 29.7 26.0 - 34.0 pg   MCHC 31.1 30.0 - 36.0 g/dL   RDW 16.6 (H) 11.5 - 15.5 %   Platelets 318 150 - 400 K/uL   nRBC 0.0 0.0 - 0.2 %    Comment: Performed at [facility], [address] [place], [place] [address]  Comprehensive metabolic panel     Status: Abnormal   Collection Time: 07/17/18  5:19 PM  Result Value Ref Range   Sodium 139 135 - 145 mmol/L   Potassium 4.1 3.5 - 5.1 mmol/L   Chloride 106 98 - 111 mmol/L   CO2 25 22 - 32 mmol/L   Glucose, Bld 97 70 - 99 mg/dL   BUN 22 8 - 23 mg/dL   Creatinine, Ser 2.20 (H) 0.44 - 1.00 mg/dL   Calcium 8.6 (L) 8.9 - 10.3 mg/dL   Total Protein 6.2 (L) 6.5 - 8.1 g/dL   Albumin 2.9 (L) 3.5 - 5.0 g/dL   AST 18 15 - 41 U/L   ALT 12 0 - 44 U/L   Alkaline Phosphatase 111 38 - 126 U/L   Total Bilirubin 1.1  0.3 - 1.2 mg/dL   GFR calc non Af Amer 20 (L) >60 mL/min   GFR calc Af Amer 24 (L) >60 mL/min   Anion gap 8 5 - 15    Comment: Performed at [facility], [address] [place], [place] [address]  Troponin I - ONCE - STAT     Status: None   Collection Time: 07/17/18  5:19 PM  Result Value Ref Range   Troponin I <0.03 <0.03 ng/mL    Comment: Performed at [facility], [address] [place], [place] [address]  Blood culture (routine x 2)     Status: None (Preliminary result)   Collection Time: 07/17/18  9:40 PM  Result Value Ref Range   Specimen Description BLOOD RIGHT ASSIST CONTROL    Special Requests      BOTTLES DRAWN AEROBIC AND ANAEROBIC Blood Culture results may not be optimal due to an excessive volume of blood received in culture bottles   Culture      NO GROWTH 2 DAYS Performed at [facility], [address], [place], [place] [address]    Report Status PENDING   Blood culture (routine x 2)     Status: None (Preliminary result)   Collection Time: 07/17/18  9:40 PM  Result Value Ref Range   Specimen Description BLOOD LEFT ASSIST CONTROL    Special Requests      BOTTLES DRAWN AEROBIC AND ANAEROBIC Blood Culture results may not be optimal due to an excessive volume of blood received in culture bottles   Culture      NO GROWTH 2 DAYS Performed at [facility], [place],  [place] [address]    Report Status PENDING   Glucose, capillary     Status: Abnormal   Collection Time: 07/18/18  2:10 AM  Result Value Ref Range   Glucose-Capillary 159 (H) 70 - 99 mg/dL  Basic metabolic panel     Status: Abnormal   Collection Time: 07/18/18  5:17 AM  Result Value Ref Range   Sodium 140 135 - 145 mmol/L   Potassium 4.4 3.5 - 5.1 mmol/L   Chloride 110 98 - 111 mmol/L   CO2 21 (L) 22 - 32 mmol/L   Glucose, Bld 175 (H) 70 - 99 mg/dL   BUN 24 (H) 8 - 23 mg/dL   Creatinine, Ser 2.01 (H) 0.44 - 1.00 mg/dL   Calcium 8.3 (L) 8.9 -  10.3 mg/dL   GFR calc non Af Amer 23 (L) >60 mL/min   GFR calc Af Amer 26 (L) >60 mL/min   Anion gap 9 5 - 15    Comment: Performed at [facility], [address] [place], [place] [address]  Glucose, capillary     Status: Abnormal   Collection Time: 07/18/18  8:10 AM  Result Value Ref Range   Glucose-Capillary 167 (H) 70 - 99 mg/dL  Glucose, capillary     Status: Abnormal   Collection Time: 07/18/18  9:43 AM  Result Value Ref Range   Glucose-Capillary 150 (H) 70 - 99 mg/dL  Glucose, capillary     Status: Abnormal   Collection Time: 07/18/18  1:47 PM  Result Value Ref Range   Glucose-Capillary 161 (H) 70 - 99 mg/dL  Glucose, capillary     Status: Abnormal   Collection Time: 07/18/18  4:36 PM  Result Value Ref Range   Glucose-Capillary 207 (H) 70 - 99 mg/dL  Glucose, capillary     Status: Abnormal   Collection Time: 07/18/18  9:14 PM  Result Value Ref Range   Glucose-Capillary 124 (H) 70 - 99 mg/dL  Glucose, capillary     Status: Abnormal   Collection Time: 07/19/18  7:27 AM  Result Value Ref Range   Glucose-Capillary 153 (H) 70 - 99 mg/dL  Creatinine, serum     Status: Abnormal   Collection Time: 07/19/18  8:16 AM  Result Value Ref Range   Creatinine, Ser 1.73 (H) 0.44 - 1.00 mg/dL   GFR calc non Af Amer 27 (L) >60 mL/min   GFR calc Af Amer 32 (L) >60 mL/min    Comment: Performed at [facility], [address] [place], [place] [address]    Current Facility-Administered Medications  Medication Dose Route Frequency Provider Last Rate Last Dose  . acetaminophen (TYLENOL) tablet 650 mg  650 mg Oral Q6H PRN Lance Coon, MD   650 mg at 07/19/18 6812   Or  . acetaminophen (TYLENOL) suppository 650 mg  650 mg Rectal Q6H PRN Lance Coon, MD      . atorvastatin (LIPITOR) tablet 20 mg  20 mg Oral Corwin Levins, MD      . busPIRone (BUSPAR) tablet 10 mg  10 mg Oral TID Lance Coon, MD   10 mg at 07/19/18 0910  . ceFEPIme (MAXIPIME) 1 g in sodium  chloride 0.9 % 100 mL IVPB  1 g Intravenous Laurence Spates, MD 200 mL/hr at 07/19/18 0919 1 g at 07/19/18 0919  . clopidogrel (PLAVIX) tablet 75 mg  75 mg Oral Daily Lance Coon, MD   75 mg at 07/19/18 0911  . famotidine (PEPCID) tablet 20 mg  20 mg Oral QHS Lance Coon, MD      . haloperidol lactate (HALDOL) injection 2 mg  2 mg Intravenous Q8H PRN Lance Coon, MD   2 mg at 07/19/18 9323   Or  . haloperidol (HALDOL) tablet 2 mg  2 mg Oral Q8H PRN Lance Coon, MD      . heparin injection 5,000 Units  5,000 Units Subcutaneous Camelia Phenes Lance Coon, MD   5,000 Units at 07/19/18 [phone]  . insulin aspart (novoLOG) injection 0-5 Units  0-5 Units Subcutaneous QHS Lance Coon, MD      . insulin aspart (novoLOG) injection 0-9 Units  0-9 Units Subcutaneous TID WC Lance Coon, MD   2 Units at 07/19/18 [phone]  . ipratropium-albuterol (DUONEB) 0.5-2.5 (3) MG/3ML nebulizer solution 3 mL  3 mL Nebulization Q4H PRN Lance Coon, MD   3 mL at 07/18/18 1018  . methylPREDNISolone sodium succinate (SOLU-MEDROL) 125 mg/2 mL injection 60 mg  60 mg Intravenous Q6H Lance Coon, MD   60 mg at 07/19/18 0913  . prochlorperazine (COMPAZINE) injection 10 mg  10 mg Intravenous Q6H PRN Lance Coon, MD      . vancomycin (VANCOCIN) IVPB 750 mg/150 ml premix  750 mg Intravenous Q36H Lance Coon, MD        Musculoskeletal: Strength & Muscle Tone: within normal limits Gait & Station: Unable to assess, patient in bed Patient leans: N/A  Psychiatric Specialty Exam: Physical Exam  Nursing note and vitals reviewed. Constitutional: She is oriented to person, place, and time. She appears well-developed and well-nourished. She appears distressed.  HENT:  Head: Normocephalic and atraumatic.  Eyes: EOM are normal.  Neck: Normal range of motion.  Cardiovascular: Normal rate.  Respiratory: Effort normal. No respiratory distress.  Musculoskeletal: Normal range of motion.  Neurological: She is alert and oriented to  person, place, and time.    Review of Systems  Constitutional: Negative.   HENT: Negative.   Respiratory: Negative.   Cardiovascular: Negative.   Gastrointestinal: Negative.   Musculoskeletal: Negative.   Neurological: Negative.   Psychiatric/Behavioral: Positive for depression and memory loss. Negative for hallucinations, substance abuse and suicidal ideas. The patient is nervous/anxious. The patient does not have insomnia.     Blood pressure (!) 92/51, pulse 100, temperature 98.8 F (37.1 C), temperature source Oral, resp. rate 18, height 5\' 2"  (1.575 m), weight 71.7 kg, SpO2 96 %.Body mass index is 28.9 kg/m.  General Appearance: Fairly Groomed  Eye Contact:  Good  Speech:  Clear and Coherent and Pressured  Volume:  Normal  Mood:  Anxious  Affect:  Congruent  Thought Process:  Goal Directed and Descriptions of Associations: Tangential  Orientation:  Other:  Not oriented to date or current events.  Thought Content:  Paranoid Ideation, Rumination and Tangential  Suicidal Thoughts:  No  Homicidal Thoughts:  No  Memory:  Immediate;   Fair Recent;   Poor Remote;   Good  Judgement:  Impaired  Insight:  Lacking  Psychomotor Activity:  Restlessness  Concentration:  Concentration: Fair  Recall:  Poor  Fund of Knowledge:  Fair  Language:  Good  Akathisia:  No  Handed:  Right  AIMS (if indicated):     Assets:  Desire for Improvement Housing Social Support  ADL's:  Impaired  Cognition:  Impaired,  Moderate  Sleep:   Unable to assess     Treatment Plan Summary: Amy [name] is a 83 y.o. female who presented with recurrent pneumonia.  Patient is highly anxious  with concerns for an underlying dementia with behavioral changes.  Family is not available for collateral.  Will discontinue Haldol and change to Seroquel 25 mg morning and afternoon with 100 mg administered at bedtime for agitation overnight which would put patient at risk of harming herself or others.   Start  fluoxetine 10 mg for anxiety and depressed mood. Discontinue buspirone, as this has not been effective in managing patient's anxiety during her hospitalization. Administer lorazepam 0.5 mg daily with meals during hospitalization. We will follow-up in the morning to assess the patient required medications and or sitter at bedside.  We will also seek collateral with family when they are available to better ascertain psychiatric needs. Daily contact with patient to assess and evaluate symptoms and progress in treatment and Medication management  Disposition: Patient does not meet criteria for psychiatric inpatient admission. Supportive therapy provided about ongoing stressors.  Lavella Hammock, MD 07/19/2018 11:58 AM

## 2018-07-20 ENCOUNTER — Telehealth: Payer: Self-pay

## 2018-07-20 ENCOUNTER — Inpatient Hospital Stay: Payer: Medicare HMO

## 2018-07-20 LAB — BASIC METABOLIC PANEL
Anion gap: 8 (ref 5–15)
BUN: 26 mg/dL — AB (ref 8–23)
CO2: 22 mmol/L (ref 22–32)
Calcium: 8.6 mg/dL — ABNORMAL LOW (ref 8.9–10.3)
Chloride: 111 mmol/L (ref 98–111)
Creatinine, Ser: 1.53 mg/dL — ABNORMAL HIGH (ref 0.44–1.00)
GFR calc Af Amer: 37 mL/min — ABNORMAL LOW (ref >60)
GFR calc non Af Amer: 32 mL/min — ABNORMAL LOW (ref >60)
GLUCOSE: 185 mg/dL — AB (ref 70–99)
POTASSIUM: 5.2 mmol/L — AB (ref 3.5–5.1)
Sodium: 141 mmol/L (ref 135–145)

## 2018-07-20 LAB — CBC
HCT: 34.7 % — ABNORMAL LOW (ref 36.0–46.0)
Hemoglobin: 10.9 g/dL — ABNORMAL LOW (ref 12.0–15.0)
MCH: 29.5 pg (ref 26.0–34.0)
MCHC: 31.4 g/dL (ref 30.0–36.0)
MCV: 93.8 fL (ref 80.0–100.0)
PLATELETS: 240 10*3/uL (ref 150–400)
RBC: 3.7 MIL/uL — ABNORMAL LOW (ref 3.87–5.11)
RDW: 16.3 % — ABNORMAL HIGH (ref 11.5–15.5)
WBC: 12.5 10*3/uL — ABNORMAL HIGH (ref 4.0–10.5)
nRBC: 0 % (ref 0.0–0.2)

## 2018-07-20 LAB — GLUCOSE, CAPILLARY
GLUCOSE-CAPILLARY: 128 mg/dL — AB (ref 70–99)
Glucose-Capillary: 169 mg/dL — ABNORMAL HIGH (ref 70–99)
Glucose-Capillary: 151 mg/dL — ABNORMAL HIGH (ref 70–99)
Glucose-Capillary: 179 mg/dL — ABNORMAL HIGH (ref 70–99)

## 2018-07-20 MED ORDER — QUETIAPINE FUMARATE 25 MG PO TABS
25.0000 mg | ORAL_TABLET | Freq: Once | ORAL | Status: DC
  Filled 2018-07-20: qty 1

## 2018-07-20 MED ORDER — DONEPEZIL HCL 5 MG PO TABS
10.0000 mg | ORAL_TABLET | Freq: Every day | ORAL | Status: DC
  Administered 2018-07-21: 10 mg via ORAL
  Filled 2018-07-20: qty 2

## 2018-07-20 MED ORDER — METOPROLOL TARTRATE 25 MG PO TABS
25.0000 mg | ORAL_TABLET | Freq: Two times a day (BID) | ORAL | Status: DC
  Administered 2018-07-20 – 2018-07-21 (×3): 25 mg via ORAL
  Filled 2018-07-20 (×3): qty 1

## 2018-07-20 MED ORDER — STROKE: EARLY STAGES OF RECOVERY BOOK: Freq: Once | Status: DC

## 2018-07-20 MED ORDER — QUETIAPINE FUMARATE 25 MG PO TABS
25.0000 mg | ORAL_TABLET | Freq: Two times a day (BID) | ORAL | Status: DC
  Administered 2018-07-20 – 2018-07-21 (×3): 25 mg via ORAL
  Filled 2018-07-20 (×3): qty 1

## 2018-07-20 MED ORDER — FUROSEMIDE 20 MG PO TABS
20.0000 mg | ORAL_TABLET | Freq: Every day | ORAL | Status: DC
  Administered 2018-07-20 – 2018-07-21 (×2): 20 mg via ORAL
  Filled 2018-07-20 (×2): qty 1

## 2018-07-20 MED ORDER — SODIUM ZIRCONIUM CYCLOSILICATE 5 G PO PACK
5.0000 g | PACK | Freq: Once | ORAL | Status: AC
  Administered 2018-07-20: 12:00:00 5 g via ORAL
  Filled 2018-07-20: qty 1

## 2018-07-20 MED ORDER — QUETIAPINE FUMARATE 100 MG PO TABS
100.0000 mg | ORAL_TABLET | Freq: Every day | ORAL | Status: DC
  Filled 2018-07-20 (×2): qty 1
  Filled 2018-07-20: qty 4

## 2018-07-20 MED ORDER — PREDNISONE 50 MG PO TABS
50.0000 mg | ORAL_TABLET | Freq: Every day | ORAL | Status: DC
  Administered 2018-07-21: 50 mg via ORAL
  Filled 2018-07-20: qty 1

## 2018-07-20 MED ORDER — QUETIAPINE FUMARATE 25 MG PO TABS
75.0000 mg | ORAL_TABLET | Freq: Once | ORAL | Status: AC
  Administered 2018-07-20: 03:00:00 75 mg via ORAL
  Filled 2018-07-20: qty 3

## 2018-07-20 MED ORDER — CEFDINIR 300 MG PO CAPS
300.0000 mg | ORAL_CAPSULE | Freq: Every day | ORAL | Status: DC
  Administered 2018-07-20 – 2018-07-21 (×2): 300 mg via ORAL
  Filled 2018-07-20 (×2): qty 1

## 2018-07-20 MED ORDER — QUETIAPINE FUMARATE 25 MG PO TABS: 100.0000 mg | ORAL_TABLET | Freq: Every day | ORAL | Status: DC

## 2018-07-20 MED ORDER — GABAPENTIN 300 MG PO CAPS
300.0000 mg | ORAL_CAPSULE | Freq: Two times a day (BID) | ORAL | Status: DC
  Administered 2018-07-20 – 2018-07-21 (×2): 300 mg via ORAL
  Filled 2018-07-20 (×3): qty 1

## 2018-07-20 NOTE — Progress Notes (Signed)
[facility] [place] NAME: Amy [name]    MR#:  [id number]  DATE OF BIRTH:  January 12, 1936  SUBJECTIVE:  CHIEF COMPLAINT:  Pt is more pleasant and interactive today.  Husband at bedside  REVIEW OF SYSTEMS:  CONSTITUTIONAL: No fever, fatigue or weakness.  EYES: No blurred or double vision.  EARS, NOSE, AND THROAT: No tinnitus or ear pain.  RESPIRATORY: No cough, shortness of breath, endorsing positive wheezing, denies hemoptysis.  CARDIOVASCULAR: No chest pain, orthopnea, edema.  GASTROINTESTINAL: No nausea, vomiting, diarrhea or abdominal pain.  SKIN: No rash or lesion. MUSCULOSKELETAL: No joint pain or arthritis.   NEUROLOGIC: No tingling, numbness, weakness.  PSYCHIATRY: Flat affect  DRUG ALLERGIES:   Allergies  Allergen Reactions  . Fentanyl Other (See Comments)    urine retention  . Penicillins Itching, Rash and Other (See Comments)    Has patient had a PCN reaction causing immediate rash, facial/tongue/throat swelling, SOB or lightheadedness with hypotension: Yes Has patient had a PCN reaction causing severe rash involving mucus membranes or skin necrosis: No Has patient had a PCN reaction that required hospitalization: No Has patient had a PCN reaction occurring within the last 10 years: Yes If all of the above answers are "NO", then may proceed with Cephalosporin use.    VITALS:  Blood pressure 127/77, pulse 99, temperature 98.2 F (36.8 C), temperature source Oral, resp. rate 17, height 5\' 2"  (1.575 m), weight 71.7 kg, SpO2 98 %.  PHYSICAL EXAMINATION:  GENERAL:  83 y.o.-year-old patient lying in the bed with no acute distress.  EYES: Pupils equal, round, reactive to light and accommodation. No scleral icterus. Extraocular muscles intact.  HEENT: Head atraumatic, normocephalic. Oropharynx and nasopharynx clear.  NECK:  Supple, no jugular venous distention. No thyroid enlargement, no tenderness.  LUNGS: Normal  breath sounds bilaterally, no wheezing, rales,rhonchi or crepitation. No use of accessory muscles of respiration.  CARDIOVASCULAR: S1, S2 normal. No murmurs, rubs, or gallops.  ABDOMEN: Soft, nontender, nondistended. Bowel sounds present.[name]  EXTREMITIES: No pedal edema, cyanosis, or clubbing.  NEUROLOGIC: Awake, alert and very agitated and belligerent sensation intact. Gait not checked.  PSYCHIATRIC: The patient is alert and oriented x 1-2, agitated SKIN: No obvious rash, lesion, or ulcer.    LABORATORY PANEL:   CBC Recent Labs  Lab 07/20/18 0431  WBC 12.5*  HGB 10.9*  HCT 34.7*  PLT 240   ------------------------------------------------------------------------------------------------------------------  Chemistries  Recent Labs  Lab 07/17/18 1719  07/20/18 0431  NA 139   < > 141  K 4.1   < > 5.2*  CL 106   < > 111  CO2 25   < > 22  GLUCOSE 97   < > 185*  BUN 22   < > 26*  CREATININE 2.20*   < > 1.53*  CALCIUM 8.6*   < > 8.6*  AST 18  --   --   ALT 12  --   --   ALKPHOS 111  --   --   BILITOT 1.1  --   --    < > = values in this interval not displayed.   ------------------------------------------------------------------------------------------------------------------  Cardiac Enzymes Recent Labs  Lab 07/17/18 1719  TROPONINI <0.03   ------------------------------------------------------------------------------------------------------------------  RADIOLOGY:  Dg Chest 2 View  Result Date: 07/20/2018 CLINICAL DATA:  Cough. EXAM: CHEST - 2 VIEW COMPARISON:  Radiographs of July 17, 2018. FINDINGS: The heart size and mediastinal contours are within normal limits. Atherosclerosis of thoracic  aorta is noted. No pneumothorax or pleural effusion is noted. Minimal bibasilar subsegmental atelectasis or scarring is noted. Stable lower thoracic old compression fracture is noted. IMPRESSION: Minimal bibasilar subsegmental atelectasis or scarring. Aortic Atherosclerosis  (ICD10-I70.0). Electronically Signed   By: Marijo Conception, M.D.   On: 07/20/2018 11:58    EKG:   Orders placed or performed during the hospital encounter of 07/17/18  . ED EKG  . ED EKG  . EKG 12-Lead  . EKG 12-Lead  . EKG 12-Lead    ASSESSMENT AND PLAN:     HCAP (healthcare-associated pneumonia) - CT chest with new right-sided opacity -Repeat chest x-ray with no acute findings Continue IV antibiotics cefepime and discontinue vancomycin as MRSA PCR negative PRN supportive treatment    COPD exacerbation (HCC) - Clinically improving  IV Solu-Medrol tapering, other supportive treatment as above, continue home inhalers  -Hyperkalemia Lokelma one-time dose and repeat labs in a.m.  Acute psychosis could be from healthcare associated pneumonia  Psych consult placed-Dr. Leverne Humbles has seen the patient.  Haldol and Seroquel added to the regimen.  Seroquel 25 mg twice a day during daytime and 100 mg nightly Home medication BuSpar discontinued  Diabetes mellitus type 2, controlled ([name]) -sliding scale insulin coverage    Anxiety -home dose anxiolytic BuSpar discontinued by psychiatry    GERD (gastroesophageal reflux disease) - PPI    CKD (chronic kidney disease) stage 4, GFR 15-29 ml/min (HCC) -at baseline, avoid nephrotoxins and monitor   Generalized weakness physical therapy -no PT needs identified patient has all equipment what is required at home  All the records are reviewed and case discussed with Care Management/Social Workerr. Management plans discussed with the patient, family and they are in agreement.  CODE STATUS: fc  TOTAL TIME TAKING CARE OF THIS PATIENT: 33 minutes.   POSSIBLE D/C IN 1DAYS, DEPENDING ON CLINICAL CONDITION.  Note: This dictation was prepared with Dragon dictation along with smaller phrase technology. Any transcriptional errors that result from this process are unintentional.   Nicholes Mango M.D on 07/20/2018 at 4:00 PM  Between 7am to 6pm -  Pager - [phone] After 6pm go to [email or web address] - password [email or web address]  Office  [phone]  CC: Primary care physician; Arnetha Courser, MD

## 2018-07-20 NOTE — Telephone Encounter (Signed)
Copied from [place] [phone]. Topic: Referral - Status >> Jul 20, 2018  5:16 PM Simone Curia D wrote: 8/61/0424 Spoke with Mr. Humphres about transportation resources for [employer].MA

## 2018-07-20 NOTE — Evaluation (Addendum)
Physical Therapy Evaluation Patient Details Name: Amy [name] MRN: [id number] DOB: 1935/11/11 Today's Date: 07/20/2018   History of Present Illness  Patient is an 83 year old female admitted with cough and sore throat. Diagnosed with PNA. PMH to include: COPD, DM, CKD, L AKA, anxiety.  Clinical Impression  Patient is alert when entered room, however confused, oriented to person only. Cannot give me information about her prior functional level, no family present.  Patient performs bed mobility( supine><sit) with mod independence. Use of bed rails. Patient is able to sit at edge of bed x 5 min with B UE support, cues needed for upright posture in sitting.  Standing or gait not attempted this visit due to decreased awareness and unsure of prior level of function. Patient will benefit from continued PT to improve strength and improve independence for return home with assist at discharge.         Follow Up Recommendations (unsure at this time as prior functional level is unknown) Per husband, she transfers with assist at baseline. Not ambulatory.     Equipment Recommendations  None recommended by PT - has what she needs at home.    Recommendations for Other Services       Precautions / Restrictions Precautions Precautions: Fall Restrictions Weight Bearing Restrictions: No      Mobility  Bed Mobility Overal bed mobility: Modified Independent             General bed mobility comments: use of bed rails  Transfers                 General transfer comment: not assessed  Ambulation/Gait             General Gait Details: gait not assessed, patient has L AKA, no prosthetic in room, unsure if she is ambulatory at baseline.  Husband reports she is not ambulatory at baseline.    Stairs            Wheelchair Mobility    Modified Rankin (Stroke Patients Only)       Balance Overall balance assessment: Modified Independent                                           Pertinent Vitals/Pain Pain Assessment: No/denies pain    Home Living Family/patient expects to be discharged to:: Private residence Living Arrangements: Spouse/significant other;Children Available Help at Discharge: Family             Additional Comments: no family present, patient unable to give history.  Returned later to speak with husband.  Husband reports patient requires assistance for most everything. She has caregivers that come a few hours in the am and then again for a few hours in the evening. Requires assistance to transfer to chair or toilet. Had [facility] services prior to admission.    Prior Function          Comments: unknown     Hand Dominance        Extremity/Trunk Assessment   Upper Extremity Assessment Upper Extremity Assessment: Overall WFL for tasks assessed    Lower Extremity Assessment Lower Extremity Assessment: Overall WFL for tasks assessed       Communication   Communication: No difficulties  Cognition Arousal/Alertness: Awake/alert Behavior During Therapy: WFL for tasks assessed/performed Overall Cognitive Status: No family/caregiver present to determine baseline cognitive functioning Area of Impairment: Orientation;Memory;Safety/judgement;Awareness;Problem  solving                 Orientation Level: Disoriented to;Place;Time;Situation   Memory: Decreased short-term memory   Safety/Judgement: Decreased awareness of safety Awareness: Intellectual Problem Solving: Requires verbal cues        General Comments      Exercises     Assessment/Plan    PT Assessment Patient needs continued PT services  PT Problem List Decreased strength;Decreased cognition;Decreased mobility;Cardiopulmonary status limiting activity;Decreased activity tolerance;Decreased safety awareness       PT Treatment Interventions Functional mobility training;Balance training;Patient/family education;Therapeutic  activities;Therapeutic exercise    PT Goals (Current goals can be found in the Care Plan section)  Acute Rehab PT Goals Patient Stated Goal: Patient wants to return home PT Goal Formulation: With patient Time For Goal Achievement: 08/03/18 Potential to Achieve Goals: Good    Frequency Min 2X/week   Barriers to discharge        Co-evaluation               AM-PAC PT "6 Clicks" Mobility  Outcome Measure Help needed turning from your back to your side while in a flat bed without using bedrails?: A Little Help needed moving from lying on your back to sitting on the side of a flat bed without using bedrails?: A Little Help needed moving to and from a bed to a chair (including a wheelchair)?: A Lot Help needed standing up from a chair using your arms (e.g., wheelchair or bedside chair)?: A Lot Help needed to walk in hospital room?: Total Help needed climbing 3-5 steps with a railing? : Total 6 Click Score: 12    End of Session Equipment Utilized During Treatment: Oxygen Activity Tolerance: Patient tolerated treatment well Patient left: in bed;with bed alarm set;with call bell/phone within reach Nurse Communication: Mobility status PT Visit Diagnosis: Muscle weakness (generalized) (M62.81)    Time: 4081-4481 PT Time Calculation (min) (ACUTE ONLY): 12 min   Charges:   PT Evaluation $PT Eval Moderate Complexity: 1 Mod          Rhapsody Wolven, PT, GCS 07/20/18,9:33 AM

## 2018-07-20 NOTE — Progress Notes (Signed)
   07/20/18 1300  Clinical Encounter Type  Visited With Patient  Visit Type Initial  Spiritual Encounters  Spiritual Needs Emotional  Pt shared her fond memories with her spouse. Pt was shouting "babe" repeatedly when ch entered the room, which gave the ch an opportunity to ask about this "babe" who was her spouse. Pt shared that she spent 37 years with her husband. Pt was able to engage in a conversation and appreciated the visit.

## 2018-07-20 NOTE — Plan of Care (Signed)
Pt highly agitated and anxious through out the beginning and middle of shift. Pt exhibited repeat yelling out. Contacted on, Vachhanni, MD, whom ordered 25 mg one time dose of Seroquel. Unchanged status following administration of Seroquel.  Reached out to on call, Serita Grit, MD, whom again ordered onetime dose 25 mg Seroquel. Following multiple complaints from multiple pts and family member. Contacted on call once again. K. Mayo then ordered 75 mg. Pt has had a total of 100 mg Seroquel as well as a dose of PRN ativan. Pt rested approximately an hour and is currently screaming out.

## 2018-07-20 NOTE — Consult Note (Signed)
[facility] Consult Follow-up   Reason for Consult: Change in mental status, behavioral issues with dementia Referring Physician:  Dr. Margaretmary Eddy Patient Identification: Amy [name] MRN:  [id number] Principal Diagnosis: HCAP (healthcare-associated pneumonia) Diagnosis:  Principal Problem:   HCAP (healthcare-associated pneumonia) Active Problems:   Anxiety   Diabetes mellitus type 2, controlled ([place])   GERD (gastroesophageal reflux disease)   GAD (generalized anxiety disorder)   CKD (chronic kidney disease) stage 4, GFR 15-29 ml/min ([place])   Hyperlipidemia   COPD exacerbation ([place])  Patient is seen, chart is reviewed.  Case reviewed with nursing staff present at bedside, and patient's attending physician.  Has been at bedside for collateral information, and for education regarding medication management.  Total Time spent with patient: 45 minutes  Subjective: "When do I get to go home  HPI:  Amy [name] is a 83 y.o. female patient admitted with shortness of breath.  Patient was brought to the ED for low oxygen saturations.  She was recently treated for pneumonia.  Here she was found to have recurrent pneumonia.  She was started on treatment for H CAP in the ED and hospitalist were called for admission.  On this writer's interview the patient she was confused and uncooperative, and unable to contribute any information to her HPI.  She also refused to cooperate with physical exam.    Psychiatry consult is requested to assist with agitated behavioral changes.  Patient is seen for follow-up.  Past Psychiatric History: Dementia, uncertain of onset.  On evaluation, patient is seen in bed.  She is anxious, but is redirectable and calms easily.  Patient's husband is at bedside.  Patient and husband as well as attending physician agree that patient is calmer with addition of Seroquel, and scheduled low-dose Ativan.  Husband expresses concern for patient being on  benzodiazepines, as her outpatient provider had encouraged her to discontinue these due to her dementia and this is why she had started on buspirone.  Husband notes that patient has been significantly more anxious and agitated since the transition of this medication.  Patient is able to respond to questions more appropriately.  She is oriented to person and place, but still has difficulty with recent memory and time.  Patient is taking in more p.o. at this time, however still remains unhappy with the cardiac diet and is looking forward to returning home to "eat what I want." She denies suicidal and homicidal ideation.  She denies auditory and visual hallucinations.  Risk to Self:  No Risk to Others:  No Prior Inpatient Therapy:  No Prior Outpatient Therapy:  No  Past Medical History:  Past Medical History:  Diagnosis Date  . Anemia    low iron  . Anxiety   . Arthritis   . Bilateral cataracts   . Bronchitis    hx of   . Cataract   . Chronic kidney disease   . Depression   . Diabetes mellitus without complication ([place])   . 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 GERD (gastroesophageal reflux disease)   . Headache   . History of frequent urinary tract infections   . Hx of renal cell cancer    LEFT  . Hyperlipidemia   . Hypertension   . Numbness and tingling    right hand   . Obesity   . Osteopenia 10/05/2017  . Peripheral artery disease ([place])   . Urinary frequency   .  Urinary incontinence     Past Surgical History:  Procedure Laterality Date  . ABDOMINAL HYSTERECTOMY  1978  . CARPAL TUNNEL RELEASE Bilateral   . CHOLECYSTECTOMY    . COLONOSCOPY    . COLONOSCOPY WITH ESOPHAGOGASTRODUODENOSCOPY (EGD)    . ERCP N/A 08/19/2017   Procedure: ENDOSCOPIC RETROGRADE CHOLANGIOPANCREATOGRAPHY (ERCP);  Surgeon: Arta Silence, MD;  Location: [facility] ENDOSCOPY;  Service: Endoscopy;  Laterality: N/A;  . LEG AMPUTATION     above the knee / left   .  ROBOT ASSISTED LAPAROSCOPIC NEPHRECTOMY Left 01/15/2015   Procedure: ROBOTIC ASSISTED LAPAROSCOPIC RADICAL NEPHRECTOMY;  Surgeon: Alexis Frock, MD;  Location: [facility] ORS;  Service: Urology;  Laterality: Left;  . stent placement right leg      Family History:  Family History  Problem Relation Age of Onset  . Cancer Mother        Stomach  . Cancer Father   . Diabetes Brother   . Cancer Brother        oral  . Healthy Son   . Healthy Sister    Family Psychiatric  History: Unknown Social History:  Social History   Substance and Sexual Activity  Alcohol Use Not Currently     Social History   Substance and Sexual Activity  Drug Use No    Social History   Socioeconomic History  . Marital status: Married    Spouse name: [name]  . Number of children: 1  . Years of education: Not on file  . Highest education level: 10th grade  Occupational History  . Occupation: Retired  Scientific laboratory technician  . Financial resource strain: Not hard at all  . Food insecurity:    Worry: Never true    Inability: Never true  . Transportation needs:    Medical: No    Non-medical: No  Tobacco Use  . Smoking status: Former Smoker    Packs/day: 1.50    Years: 51.00    Pack years: 76.50    Types: Cigarettes    Last attempt to quit: 05/24/2004    Years since quitting: 14.1  . Smokeless tobacco: Never Used  . Tobacco comment: smoking cessation materials not required  Substance and Sexual Activity  . Alcohol use: Not Currently  . Drug use: No  . Sexual activity: Not Currently  Lifestyle  . Physical activity:    Days per week: 0 days    Minutes per session: 0 min  . Stress: Very much  Relationships  . Social connections:    Talks on phone: Patient refused    Gets together: Patient refused    Attends religious service: Patient refused    Active member of club or organization: Patient refused    Attends meetings of clubs or organizations: Patient refused    Relationship status: Married  Other Topics  Concern  . Not on file  Social History Narrative  . Not on file   Additional Social History:  Patient reports that she is married with 2 grown sons.  Allergies:   Allergies  Allergen Reactions  . Fentanyl Other (See Comments)    urine retention  . Penicillins Itching, Rash and Other (See Comments)    Has patient had a PCN reaction causing immediate rash, facial/tongue/throat swelling, SOB or lightheadedness with hypotension: Yes Has patient had a PCN reaction causing severe rash involving mucus membranes or skin necrosis: No Has patient had a PCN reaction that required hospitalization: No Has patient had a PCN reaction occurring within the last 10 years: Yes  If all of the above answers are "NO", then may proceed with Cephalosporin use.    Labs:  Results for orders placed or performed during the hospital encounter of 07/17/18 (from the past 48 hour(s))  Glucose, capillary     Status: Abnormal   Collection Time: 07/18/18  1:47 PM  Result Value Ref Range   Glucose-Capillary 161 (H) 70 - 99 mg/dL  Glucose, capillary     Status: Abnormal   Collection Time: 07/18/18  4:36 PM  Result Value Ref Range   Glucose-Capillary 207 (H) 70 - 99 mg/dL  Glucose, capillary     Status: Abnormal   Collection Time: 07/18/18  9:14 PM  Result Value Ref Range   Glucose-Capillary 124 (H) 70 - 99 mg/dL  Glucose, capillary     Status: Abnormal   Collection Time: 07/19/18  7:27 AM  Result Value Ref Range   Glucose-Capillary 153 (H) 70 - 99 mg/dL  Creatinine, serum     Status: Abnormal   Collection Time: 07/19/18  8:16 AM  Result Value Ref Range   Creatinine, Ser 1.73 (H) 0.44 - 1.00 mg/dL   GFR calc non Af Amer 27 (L) >60 mL/min   GFR calc Af Amer 32 (L) >60 mL/min    Comment: Performed at [facility], [address] [place], [place] [address]  MRSA PCR Screening     Status: None   Collection Time: 07/19/18 12:10 PM  Result Value Ref Range   MRSA by PCR NEGATIVE NEGATIVE    Comment:         The GeneXpert MRSA Assay (FDA approved for NASAL specimens only), is one component of a comprehensive MRSA colonization surveillance program. It is not intended to diagnose MRSA infection nor to guide or monitor treatment for MRSA infections. Performed at [facility], [address] [place], [place] [address]   Glucose, capillary     Status: Abnormal   Collection Time: 07/19/18 12:17 PM  Result Value Ref Range   Glucose-Capillary 154 (H) 70 - 99 mg/dL  Glucose, capillary     Status: Abnormal   Collection Time: 07/19/18  4:56 PM  Result Value Ref Range   Glucose-Capillary 155 (H) 70 - 99 mg/dL  Glucose, capillary     Status: Abnormal   Collection Time: 07/19/18  9:09 PM  Result Value Ref Range   Glucose-Capillary 172 (H) 70 - 99 mg/dL   Comment 1 Notify RN   CBC     Status: Abnormal   Collection Time: 07/20/18  4:31 AM  Result Value Ref Range   WBC 12.5 (H) 4.0 - 10.5 K/uL   RBC 3.70 (L) 3.87 - 5.11 MIL/uL   Hemoglobin 10.9 (L) 12.0 - 15.0 g/dL   HCT 34.7 (L) 36.0 - 46.0 %   MCV 93.8 80.0 - 100.0 fL   MCH 29.5 26.0 - 34.0 pg   MCHC 31.4 30.0 - 36.0 g/dL   RDW 16.3 (H) 11.5 - 15.5 %   Platelets 240 150 - 400 K/uL   nRBC 0.0 0.0 - 0.2 %    Comment: Performed at [facility], [address] [place], [place] [address]  Basic metabolic panel     Status: Abnormal   Collection Time: 07/20/18  4:31 AM  Result Value Ref Range   Sodium 141 135 - 145 mmol/L   Potassium 5.2 (H) 3.5 - 5.1 mmol/L   Chloride 111 98 - 111 mmol/L   CO2 22 22 - 32 mmol/L   Glucose,  Bld 185 (H) 70 - 99 mg/dL   BUN 26 (H) 8 - 23 mg/dL   Creatinine, Ser 1.53 (H) 0.44 - 1.00 mg/dL   Calcium 8.6 (L) 8.9 - 10.3 mg/dL   GFR calc non Af Amer 32 (L) >60 mL/min   GFR calc Af Amer 37 (L) >60 mL/min   Anion gap 8 5 - 15    Comment: Performed at [facility], [address] [place], [place] [address]  Glucose, capillary     Status: Abnormal   Collection Time: 07/20/18   7:34 AM  Result Value Ref Range   Glucose-Capillary 169 (H) 70 - 99 mg/dL    Current Facility-Administered Medications  Medication Dose Route Frequency Provider Last Rate Last Dose  . 0.9 %  sodium chloride infusion   Intravenous PRN Gouru, Aruna, MD 5 mL/hr at 07/20/18 0400    . acetaminophen (TYLENOL) tablet 650 mg  650 mg Oral Q6H PRN Lance Coon, MD   650 mg at 07/19/18 1558   Or  . acetaminophen (TYLENOL) suppository 650 mg  650 mg Rectal Q6H PRN Lance Coon, MD      . atorvastatin (LIPITOR) tablet 20 mg  20 mg Oral Corwin Levins, MD   20 mg at 07/19/18 2147  . ceFEPIme (MAXIPIME) 1 g in sodium chloride 0.9 % 100 mL IVPB  1 g Intravenous Q12H Lance Coon, MD   Stopped at 07/20/18 [phone]  . clopidogrel (PLAVIX) tablet 75 mg  75 mg Oral Daily Lance Coon, MD   75 mg at 07/19/18 7672  . famotidine (PEPCID) tablet 20 mg  20 mg Oral Corwin Levins, MD   20 mg at 07/19/18 2145  . FLUoxetine (PROZAC) capsule 10 mg  10 mg Oral Daily Lavella Hammock, MD   10 mg at 07/19/18 1446  . heparin injection 5,000 Units  5,000 Units Subcutaneous Camelia Phenes Lance Coon, MD   5,000 Units at 07/19/18 [phone]  . insulin aspart (novoLOG) injection 0-5 Units  0-5 Units Subcutaneous QHS Lance Coon, MD      . insulin aspart (novoLOG) injection 0-9 Units  0-9 Units Subcutaneous TID WC Lance Coon, MD   2 Units at 07/20/18 [phone]  . ipratropium-albuterol (DUONEB) 0.5-2.5 (3) MG/3ML nebulizer solution 3 mL  3 mL Nebulization Q4H PRN Lance Coon, MD   3 mL at 07/18/18 1018  . LORazepam (ATIVAN) injection 0.5 mg  0.5 mg Intravenous Q6H PRN Gouru, Aruna, MD   0.5 mg at 07/20/18 0211  . LORazepam (ATIVAN) tablet 0.5 mg  0.5 mg Oral TID WC Lavella Hammock, MD   0.5 mg at 07/19/18 1711  . methylPREDNISolone sodium succinate (SOLU-MEDROL) 125 mg/2 mL injection 60 mg  60 mg Intravenous Q6H Lance Coon, MD   60 mg at 07/20/18 8366  . prochlorperazine (COMPAZINE) injection 10 mg  10 mg Intravenous Q6H PRN Lance Coon, MD   10 mg at 07/20/18 0211    Musculoskeletal: Strength & Muscle Tone: within normal limits Gait & Station: Unable to assess, patient in bed Patient leans: N/A  Psychiatric Specialty Exam: Physical Exam  Nursing note and vitals reviewed. Constitutional: She is oriented to person, place, and time. She appears well-developed and well-nourished. No distress.  HENT:  Head: Normocephalic and atraumatic.  Eyes: EOM are normal.  Neck: Normal range of motion.  Cardiovascular: Normal rate.  Respiratory: Effort normal. No respiratory distress.  Musculoskeletal: Normal range of motion.  Neurological: She is alert and oriented to person, place, and  time.    Review of Systems  Constitutional: Negative.   HENT: Negative.   Respiratory: Negative.   Cardiovascular: Negative.   Gastrointestinal: Negative.   Musculoskeletal: Negative.   Neurological: Negative.   Psychiatric/Behavioral: Positive for depression and memory loss. Negative for hallucinations, substance abuse and suicidal ideas. The patient is nervous/anxious. The patient does not have insomnia.     Blood pressure (!) 106/59, pulse (!) 120, temperature (!) 97.3 F (36.3 C), resp. rate 16, height 5\' 2"  (1.575 m), weight 71.7 kg, SpO2 93 %.Body mass index is 28.9 kg/m.  General Appearance: Fairly Groomed  Eye Contact:  Good  Speech:  Clear and Coherent  Volume:  Normal  Mood:  Anxious  Affect:  Congruent  Thought Process:  Goal Directed and Descriptions of Associations: Tangential  Orientation:  Other:  Not oriented to date or current events.  Thought Content:  Rumination and Tangential  Suicidal Thoughts:  No  Homicidal Thoughts:  No  Memory:  Immediate;   Fair Recent;   Poor Remote;   Good  Judgement:  Impaired  Insight:  Lacking  Psychomotor Activity:  Restlessness  Concentration:  Concentration: Fair  Recall:  Poor  Fund of Knowledge:  Fair  Language:  Good  Akathisia:  No  Handed:  Right  AIMS (if  indicated):     Assets:  Desire for Improvement Housing Social Support  ADL's:  Impaired  Cognition:  Impaired,  Moderate  Sleep:   Adequate     Treatment Plan Summary: SRIJA SOUTHARD is a 83 y.o. female who presented with recurrent pneumonia.  Patient is highly anxious with concerns for an underlying dementia with behavioral changes.   Husband is at bedside today, and have reviewed medication changes with him, as well as discussed risks, benefits, side effects, and adverse effects of antipsychotic medication to include black box warning for sudden cardiac death, and increased risk of stroke. Continue Seroquel 25 mg morning and afternoon with 100 mg administered at bedtime for behavioral control. Continue fluoxetine 10 mg for anxiety and depressed mood. Discontinue Ativan due to increased risk of worsening cognition and falls risk  Daily contact with patient to assess and evaluate symptoms and progress in treatment and Medication management  Disposition: Patient does not meet criteria for psychiatric inpatient admission. Supportive therapy provided about ongoing stressors.  Lavella Hammock, MD 07/20/2018 9:57 AM

## 2018-07-21 ENCOUNTER — Telehealth: Payer: Self-pay

## 2018-07-21 LAB — URINALYSIS, COMPLETE (UACMP) WITH MICROSCOPIC
BILIRUBIN URINE: NEGATIVE
Glucose, UA: NEGATIVE mg/dL
Ketones, ur: NEGATIVE mg/dL
Nitrite: NEGATIVE
Protein, ur: NEGATIVE mg/dL
Specific Gravity, Urine: 1.009 (ref 1.005–1.030)
WBC, UA: >50 WBC/hpf — ABNORMAL HIGH (ref 0–5)
pH: 6 (ref 5.0–8.0)

## 2018-07-21 LAB — CBC
HEMATOCRIT: 34.4 % — AB (ref 36.0–46.0)
Hemoglobin: 10.8 g/dL — ABNORMAL LOW (ref 12.0–15.0)
MCH: 29.1 pg (ref 26.0–34.0)
MCHC: 31.4 g/dL (ref 30.0–36.0)
MCV: 92.7 fL (ref 80.0–100.0)
Platelets: 247 10*3/uL (ref 150–400)
RBC: 3.71 MIL/uL — ABNORMAL LOW (ref 3.87–5.11)
RDW: 15.9 % — AB (ref 11.5–15.5)
WBC: 11.2 10*3/uL — ABNORMAL HIGH (ref 4.0–10.5)
nRBC: 0 % (ref 0.0–0.2)

## 2018-07-21 LAB — BASIC METABOLIC PANEL
Anion gap: 8 (ref 5–15)
BUN: 31 mg/dL — ABNORMAL HIGH (ref 8–23)
CO2: 24 mmol/L (ref 22–32)
CREATININE: 1.38 mg/dL — AB (ref 0.44–1.00)
Calcium: 8.5 mg/dL — ABNORMAL LOW (ref 8.9–10.3)
Chloride: 108 mmol/L (ref 98–111)
GFR calc Af Amer: 42 mL/min — ABNORMAL LOW (ref >60)
GFR calc non Af Amer: 36 mL/min — ABNORMAL LOW (ref >60)
Glucose, Bld: 129 mg/dL — ABNORMAL HIGH (ref 70–99)
Potassium: 4.2 mmol/L (ref 3.5–5.1)
SODIUM: 140 mmol/L (ref 135–145)

## 2018-07-21 LAB — GLUCOSE, CAPILLARY
Glucose-Capillary: 108 mg/dL — ABNORMAL HIGH (ref 70–99)
Glucose-Capillary: 177 mg/dL — ABNORMAL HIGH (ref 70–99)
Glucose-Capillary: 165 mg/dL — ABNORMAL HIGH (ref 70–99)

## 2018-07-21 MED ORDER — ACETAMINOPHEN 325 MG PO TABS: 650.0000 mg | ORAL_TABLET | Freq: Four times a day (QID) | ORAL | Status: DC | PRN

## 2018-07-21 MED ORDER — CEFDINIR 300 MG PO CAPS: 300.0000 mg | ORAL_CAPSULE | Freq: Every day | ORAL | 0 refills | Status: DC

## 2018-07-21 MED ORDER — FLUCONAZOLE 50 MG PO TABS
150.0000 mg | ORAL_TABLET | Freq: Once | ORAL | Status: AC
  Administered 2018-07-21: 150 mg via ORAL
  Filled 2018-07-21 (×2): qty 1

## 2018-07-21 MED ORDER — FLUOXETINE HCL 10 MG PO CAPS: 10.0000 mg | ORAL_CAPSULE | Freq: Every day | ORAL | 0 refills | Status: AC

## 2018-07-21 MED ORDER — PREDNISONE 10 MG PO TABS: 10.0000 mg | ORAL_TABLET | Freq: Every day | ORAL | 0 refills | Status: DC

## 2018-07-21 MED ORDER — QUETIAPINE FUMARATE 25 MG PO TABS: 25.0000 mg | ORAL_TABLET | ORAL | 0 refills | Status: DC

## 2018-07-21 NOTE — Progress Notes (Signed)
Called NP on call Fran Lowes in ED per Dr Margaretmary Eddy after results of urinalysis came back.  Ms Casimer Bilis said that [facility] patient is going home on is all she patient would need and may be discharged as ordered.  She said that patient could follow up with their primary next week on the results of urine cultures she was ordering as an outpatient

## 2018-07-21 NOTE — Care Management (Signed)
Discharge to home today per Dr. Margaretmary Eddy. Spoke with husband at the bedside. States that he can transport per private car.  Floydene Flock, [facility] representative updated. [facility] will be in the home for services,, Husband stated he has caregivers 7:00am-1:00pm  And 5:00pm - 10:00pm Shelbie Ammons RN MSN [facility] [phone]

## 2018-07-21 NOTE — Care Management Important Message (Signed)
Important Message  Patient Details  Name: Amy [name] MRN: [id number] Date of Birth: [birth year]   Medicare Important Message Given:  Yes    [name] A Rayvon Dakin 07/21/2018, 11:01 AM

## 2018-07-21 NOTE — Discharge Instructions (Signed)
Follow-up with primary care physician in 3 days Follow-up with outpatient psychiatrist in 1 week

## 2018-07-21 NOTE — Consult Note (Signed)
[place] Psychiatry Consult   Reason for Consult: Follow-up consult for this 83 year old woman recovering from pneumonia who has been seen by psychiatry for delirium Referring Physician: Gouru Patient Identification: Amy [name] MRN:  [id number] Principal Diagnosis: HCAP (healthcare-associated pneumonia) Diagnosis:  Principal Problem:   HCAP (healthcare-associated pneumonia) Active Problems:   Anxiety   Diabetes mellitus type 2, controlled ([place])   GERD (gastroesophageal reflux disease)   GAD (generalized anxiety disorder)   CKD (chronic kidney disease) stage 4, GFR 15-29 ml/min ([place])   Hyperlipidemia   COPD exacerbation ([place])   Total Time spent with patient: 30 minutes  Subjective:   Amy [name] is a 83 y.o. female patient admitted with "I was sick".  HPI: Patient seen.  Spoke with the patient and her husband and other family.  Chart reviewed.  This is an 83 year old woman who has a past psychiatric history only notable for some longstanding subjective anxiety who is in the hospital recovering from a pneumonia.  Patient was very confused and inarticulate and agitated earlier in her hospital stay.  Dr. Leverne Humbles has seen the patient and made some medication changes.  Patient at this point is not on any benzodiazepines but is on Seroquel low doses during the day and 100 mg at night.  That when I first saw the patient today the family particularly the husband told me that earlier this afternoon she had remained very confused and not making sense not being able to speak clearly.  I gently woke the patient up and found her to be significantly better than that.  She made eye contact.  She answered questions appropriately.  She was able to identify the people in the room although she had some trouble remembering names.  She knew the correct year and month.  Knew that she was in the hospital.  Patient did not appear to be responding to internal stimuli did not appear to be  delirious.  Maintained engaged in the conversation and the time we talked.  No evidence of acute dangerousness.  Spoke with nursing who confirm for me that earlier in the day the patient was still rather confused.  Past Psychiatric History: Patient has a past history of subjective anxiety.  Evidently she used to take alprazolam but that was discontinued sometime ago, not just before the hospitalization, and the patient was switched over to buspirone.  No hospitalizations no suicidality no psychosis  Risk to Self:   Risk to Others:   Prior Inpatient Therapy:   Prior Outpatient Therapy:    Past Medical History:  Past Medical History:  Diagnosis Date  . Anemia    low iron  . Anxiety   . Arthritis   . Bilateral cataracts   . Bronchitis    hx of   . Cataract   . Chronic kidney disease   . Depression   . Diabetes mellitus without complication ([place])   . 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 GERD (gastroesophageal reflux disease)   . Headache   . History of frequent urinary tract infections   . Hx of renal cell cancer    LEFT  . Hyperlipidemia   . Hypertension   . Numbness and tingling    right hand   . Obesity   . Osteopenia 10/05/2017  . Peripheral artery disease ([place])   . Urinary frequency   . Urinary incontinence     Past Surgical History:  Procedure  Laterality Date  . ABDOMINAL HYSTERECTOMY  1978  . CARPAL TUNNEL RELEASE Bilateral   . CHOLECYSTECTOMY    . COLONOSCOPY    . COLONOSCOPY WITH ESOPHAGOGASTRODUODENOSCOPY (EGD)    . ERCP N/A 08/19/2017   Procedure: ENDOSCOPIC RETROGRADE CHOLANGIOPANCREATOGRAPHY (ERCP);  Surgeon: Arta Silence, MD;  Location: [facility] ENDOSCOPY;  Service: Endoscopy;  Laterality: N/A;  . LEG AMPUTATION     above the knee / left   . ROBOT ASSISTED LAPAROSCOPIC NEPHRECTOMY Left 01/15/2015   Procedure: ROBOTIC ASSISTED LAPAROSCOPIC RADICAL NEPHRECTOMY;  Surgeon: Alexis Frock, MD;  Location: [facility] ORS;   Service: Urology;  Laterality: Left;  . stent placement right leg      Family History:  Family History  Problem Relation Age of Onset  . Cancer Mother        Stomach  . Cancer Father   . Diabetes Brother   . Cancer Brother        oral  . Healthy Son   . Healthy Sister    Family Psychiatric  History: None known Social History:  Social History   Substance and Sexual Activity  Alcohol Use Not Currently     Social History   Substance and Sexual Activity  Drug Use No    Social History   Socioeconomic History  . Marital status: Married    Spouse name: [name]  . Number of children: 1  . Years of education: Not on file  . Highest education level: 10th grade  Occupational History  . Occupation: Retired  Scientific laboratory technician  . Financial resource strain: Not hard at all  . Food insecurity:    Worry: Never true    Inability: Never true  . Transportation needs:    Medical: No    Non-medical: No  Tobacco Use  . Smoking status: Former Smoker    Packs/day: 1.50    Years: 51.00    Pack years: 76.50    Types: Cigarettes    Last attempt to quit: 05/24/2004    Years since quitting: 14.1  . Smokeless tobacco: Never Used  . Tobacco comment: smoking cessation materials not required  Substance and Sexual Activity  . Alcohol use: Not Currently  . Drug use: No  . Sexual activity: Not Currently  Lifestyle  . Physical activity:    Days per week: 0 days    Minutes per session: 0 min  . Stress: Very much  Relationships  . Social connections:    Talks on phone: Patient refused    Gets together: Patient refused    Attends religious service: Patient refused    Active member of club or organization: Patient refused    Attends meetings of clubs or organizations: Patient refused    Relationship status: Married  Other Topics Concern  . Not on file  Social History Narrative  . Not on file   Additional Social History:    Allergies:   Allergies  Allergen Reactions  . Fentanyl Other  (See Comments)    urine retention  . Penicillins Itching, Rash and Other (See Comments)    Has patient had a PCN reaction causing immediate rash, facial/tongue/throat swelling, SOB or lightheadedness with hypotension: Yes Has patient had a PCN reaction causing severe rash involving mucus membranes or skin necrosis: No Has patient had a PCN reaction that required hospitalization: No Has patient had a PCN reaction occurring within the last 10 years: Yes If all of the above answers are "NO", then may proceed with Cephalosporin use.    Labs:  Results for orders placed or performed during the hospital encounter of 07/17/18 (from the past 48 hour(s))  Glucose, capillary     Status: Abnormal   Collection Time: 07/19/18  9:09 PM  Result Value Ref Range   Glucose-Capillary 172 (H) 70 - 99 mg/dL   Comment 1 Notify RN   CBC     Status: Abnormal   Collection Time: 07/20/18  4:31 AM  Result Value Ref Range   WBC 12.5 (H) 4.0 - 10.5 K/uL   RBC 3.70 (L) 3.87 - 5.11 MIL/uL   Hemoglobin 10.9 (L) 12.0 - 15.0 g/dL   HCT 34.7 (L) 36.0 - 46.0 %   MCV 93.8 80.0 - 100.0 fL   MCH 29.5 26.0 - 34.0 pg   MCHC 31.4 30.0 - 36.0 g/dL   RDW 16.3 (H) 11.5 - 15.5 %   Platelets 240 150 - 400 K/uL   nRBC 0.0 0.0 - 0.2 %    Comment: Performed at [facility], [address] [place], [place] [address]  Basic metabolic panel     Status: Abnormal   Collection Time: 07/20/18  4:31 AM  Result Value Ref Range   Sodium 141 135 - 145 mmol/L   Potassium 5.2 (H) 3.5 - 5.1 mmol/L   [place] 111 98 - 111 mmol/L   CO2 22 22 - 32 mmol/L   Glucose, Bld 185 (H) 70 - 99 mg/dL   BUN 26 (H) 8 - 23 mg/dL   Creatinine, Ser 1.53 (H) 0.44 - 1.00 mg/dL   Calcium 8.6 (L) 8.9 - 10.3 mg/dL   GFR calc non Af Amer 32 (L) >60 mL/min   GFR calc Af Amer 37 (L) >60 mL/min   Anion gap 8 5 - 15    Comment: Performed at [facility], [address] [place], [place] [address]  Glucose, capillary     Status: Abnormal    Collection Time: 07/20/18  7:34 AM  Result Value Ref Range   Glucose-Capillary 169 (H) 70 - 99 mg/dL  Glucose, capillary     Status: Abnormal   Collection Time: 07/20/18 11:45 AM  Result Value Ref Range   Glucose-Capillary 151 (H) 70 - 99 mg/dL  Glucose, capillary     Status: Abnormal   Collection Time: 07/20/18  4:42 PM  Result Value Ref Range   Glucose-Capillary 179 (H) 70 - 99 mg/dL  Glucose, capillary     Status: Abnormal   Collection Time: 07/20/18  9:39 PM  Result Value Ref Range   Glucose-Capillary 128 (H) 70 - 99 mg/dL   Comment 1 Notify RN   Basic metabolic panel     Status: Abnormal   Collection Time: 07/21/18  4:53 AM  Result Value Ref Range   Sodium 140 135 - 145 mmol/L   Potassium 4.2 3.5 - 5.1 mmol/L   [place] 108 98 - 111 mmol/L   CO2 24 22 - 32 mmol/L   Glucose, Bld 129 (H) 70 - 99 mg/dL   BUN 31 (H) 8 - 23 mg/dL   Creatinine, Ser 1.38 (H) 0.44 - 1.00 mg/dL   Calcium 8.5 (L) 8.9 - 10.3 mg/dL   GFR calc non Af Amer 36 (L) >60 mL/min   GFR calc Af Amer 42 (L) >60 mL/min   Anion gap 8 5 - 15    Comment: Performed at [facility], [address] [place], [place] [address]  CBC     Status: Abnormal   Collection Time: 07/21/18  4:53  AM  Result Value Ref Range   WBC 11.2 (H) 4.0 - 10.5 K/uL   RBC 3.71 (L) 3.87 - 5.11 MIL/uL   Hemoglobin 10.8 (L) 12.0 - 15.0 g/dL   HCT 34.4 (L) 36.0 - 46.0 %   MCV 92.7 80.0 - 100.0 fL   MCH 29.1 26.0 - 34.0 pg   MCHC 31.4 30.0 - 36.0 g/dL   RDW 15.9 (H) 11.5 - 15.5 %   Platelets 247 150 - 400 K/uL   nRBC 0.0 0.0 - 0.2 %    Comment: Performed at [facility], [address] [place], [place] [address]  Glucose, capillary     Status: Abnormal   Collection Time: 07/21/18  8:03 AM  Result Value Ref Range   Glucose-Capillary 108 (H) 70 - 99 mg/dL  Glucose, capillary     Status: Abnormal   Collection Time: 07/21/18 12:43 PM  Result Value Ref Range   Glucose-Capillary 177 (H) 70 - 99 mg/dL  Glucose,  capillary     Status: Abnormal   Collection Time: 07/21/18  5:35 PM  Result Value Ref Range   Glucose-Capillary 165 (H) 70 - 99 mg/dL    Current Facility-Administered Medications  Medication Dose Route Frequency Provider Last Rate Last Dose  . 0.9 %  sodium chloride infusion   Intravenous PRN Gouru, Illene Silver, MD   Stopped at 07/21/18 1412  . acetaminophen (TYLENOL) tablet 650 mg  650 mg Oral Q6H PRN Lance Coon, MD   650 mg at 07/21/18 7096   Or  . acetaminophen (TYLENOL) suppository 650 mg  650 mg Rectal Q6H PRN Lance Coon, MD      . atorvastatin (LIPITOR) tablet 20 mg  20 mg Oral Corwin Levins, MD   20 mg at 07/21/18 0049  . cefdinir (OMNICEF) capsule 300 mg  300 mg Oral Daily Gouru, Aruna, MD   300 mg at 07/21/18 0819  . clopidogrel (PLAVIX) tablet 75 mg  75 mg Oral Daily Lance Coon, MD   75 mg at 07/21/18 0818  . donepezil (ARICEPT) tablet 10 mg  10 mg Oral QHS Gouru, Illene Silver, MD   10 mg at 07/21/18 0048  . famotidine (PEPCID) tablet 20 mg  20 mg Oral Corwin Levins, MD   20 mg at 07/21/18 0049  . fluconazole (DIFLUCAN) tablet 150 mg  150 mg Oral Once Gouru, Aruna, MD      . FLUoxetine (PROZAC) capsule 10 mg  10 mg Oral Daily Lavella Hammock, MD   10 mg at 07/21/18 0818  . furosemide (LASIX) tablet 20 mg  20 mg Oral Daily Gouru, Aruna, MD   20 mg at 07/21/18 0818  . gabapentin (NEURONTIN) capsule 300 mg  300 mg Oral BID Gouru, Aruna, MD   300 mg at 07/21/18 0817  . heparin injection 5,000 Units  5,000 Units Subcutaneous Driscilla Moats, MD   5,000 Units at 07/20/18 1515  . insulin aspart (novoLOG) injection 0-5 Units  0-5 Units Subcutaneous QHS Lance Coon, MD      . insulin aspart (novoLOG) injection 0-9 Units  0-9 Units Subcutaneous TID WC Lance Coon, MD   2 Units at 07/21/18 1804  . ipratropium-albuterol (DUONEB) 0.5-2.5 (3) MG/3ML nebulizer solution 3 mL  3 mL Nebulization Q4H PRN Lance Coon, MD   3 mL at 07/18/18 1018  . metoprolol tartrate (LOPRESSOR) tablet 25  mg  25 mg Oral BID Gouru, Aruna, MD   25 mg at 07/21/18 0818  . predniSONE (DELTASONE) tablet  50 mg  50 mg Oral Q breakfast Gouru, Aruna, MD   50 mg at 07/21/18 0818  . prochlorperazine (COMPAZINE) injection 10 mg  10 mg Intravenous Q6H PRN Lance Coon, MD   10 mg at 07/20/18 0211  . QUEtiapine (SEROQUEL) tablet 100 mg  100 mg Oral QHS Gouru, Aruna, MD      . QUEtiapine (SEROQUEL) tablet 25 mg  25 mg Oral BID Gouru, Aruna, MD   25 mg at 07/21/18 1804    Musculoskeletal: Strength & Muscle Tone: decreased Gait & Station: unsteady Patient leans: N/A  Psychiatric Specialty Exam: Physical Exam  Nursing note and vitals reviewed. Constitutional: She appears well-developed and well-nourished.  HENT:  Head: Normocephalic and atraumatic.  Eyes: Pupils are equal, round, and reactive to light. Conjunctivae are normal.  Neck: Normal range of motion.  Cardiovascular: Regular rhythm and normal heart sounds.  Respiratory: She has wheezes.  GI: Soft.  Musculoskeletal: Normal range of motion.  Neurological: She is alert.  Skin: Skin is warm and dry.  Psychiatric: Judgment normal. Her affect is blunt. Her speech is delayed. She is slowed. Thought content is not paranoid. Cognition and memory are impaired. She expresses no homicidal and no suicidal ideation.    Review of Systems  Constitutional: Negative.   HENT: Negative.   Eyes: Negative.   Respiratory: Positive for shortness of breath.   Cardiovascular: Negative.   Gastrointestinal: Negative.   Musculoskeletal: Negative.   Skin: Negative.   Neurological: Negative.   Psychiatric/Behavioral: Positive for memory loss. Negative for depression, hallucinations, substance abuse and suicidal ideas. The patient is nervous/anxious. The patient does not have insomnia.     Blood pressure (!) 142/69, pulse 76, temperature 97.8 F (36.6 C), temperature source Oral, resp. rate 16, height 5\' 2"  (1.575 m), weight 71.7 kg, SpO2 94 %.Body mass index is 28.9  kg/m.  General Appearance: Casual  Eye Contact:  Fair  Speech:  Slow  Volume:  Decreased  Mood:  Anxious  Affect:  Congruent and Constricted  Thought Process:  Coherent  Orientation:  Full (Time, Place, and Person)  Thought Content:  Pretty simple and concrete.  On the other hand she was not repeating herself as she was apparently doing earlier in the day.  Suicidal Thoughts:  No  Homicidal Thoughts:  No  Memory:  Immediate;   Fair Recent;   Fair Remote;   Fair  Judgement:  Fair  Insight:  Fair  Psychomotor Activity:  Decreased  Concentration:  Concentration: Fair  Recall:  [employer] of Knowledge:  Fair  Language:  Fair  Akathisia:  No  Handed:  Right  AIMS (if indicated):     Assets:  Desire for Improvement Housing Resilience  ADL's:  Impaired  Cognition:  Impaired,  Mild and Moderate  Sleep:        Treatment Plan Summary: Medication management and Plan 83 year old woman recovering from pneumonia.  Evidently still having intermittent spells of confusion and delirium.  Spoke with the family and told him that I believe that the current medicines as ordered made sense.  Explained that low-dose antipsychotics could help with delirium.  Explained that delirium frequently happened in the hospital setting with this sort of illness and that we would expect that as the patient medically recovers at home the delirium should improve.  I do not recommend making any changes to medication or treatment plan.  Family was also concerned about whether the patient was medically stable to go home and I advised him  that that was something I could not judge as far as her pulmonary function and they should take that up with the rest of the treatment team.  At this point however no change to any psychiatric treatment.  I advised him that as she improves she could probably gradually discontinue the Seroquel at home but they should follow-up with her primary care doctor about this.  Disposition: No  evidence of imminent risk to self or others at present.   Patient does not meet criteria for psychiatric inpatient admission. Supportive therapy provided about ongoing stressors.  Alethia Berthold, MD 07/21/2018 6:14 PM

## 2018-07-21 NOTE — Telephone Encounter (Signed)
Copied from [place] [id number]. Topic: Referral - Status >> Jul 21, 2018  5:49 PM Simone Curia D wrote: 01/16/4157 Left message with my name and number for Mr Dement to call me back about information on Medicare transportation.MA

## 2018-07-21 NOTE — Discharge Summary (Signed)
[facility] NAME: Amy [name]    MR#:  [id number]  DATE OF BIRTH:  1935-11-24  DATE OF ADMISSION:  07/17/2018 ADMITTING PHYSICIAN: Lance Coon, MD  DATE OF DISCHARGE:  07/21/18   PRIMARY CARE PHYSICIAN: Arnetha Courser, MD    ADMISSION DIAGNOSIS:  Hypoxia [R09.02] COPD exacerbation ([place]) [J44.1] Community acquired pneumonia, unspecified laterality [J18.9]  DISCHARGE DIAGNOSIS:  Principal Problem:   HCAP (healthcare-associated pneumonia) Active Problems:   Anxiety   Diabetes mellitus type 2, controlled (HCC)   GERD (gastroesophageal reflux disease)   GAD (generalized anxiety disorder)   CKD (chronic kidney disease) stage 4, GFR 15-29 ml/min (HCC)   Hyperlipidemia   COPD exacerbation ([place])   SECONDARY DIAGNOSIS:   Past Medical History:  Diagnosis Date  . Anemia    low iron  . Anxiety   . Arthritis   . Bilateral cataracts   . Bronchitis    hx of   . Cataract   . Chronic kidney disease   . Depression   . Diabetes mellitus without complication ([place])   . 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 GERD (gastroesophageal reflux disease)   . Headache   . History of frequent urinary tract infections   . Hx of renal cell cancer    LEFT  . Hyperlipidemia   . Hypertension   . Numbness and tingling    right hand   . Obesity   . Osteopenia 10/05/2017  . Peripheral artery disease ([place])   . Urinary frequency   . Urinary incontinence     HOSPITAL COURSE:   HPI  Amy [name]  is a 83 y.o. female who presents with chief complaint as above.  Patient was brought to the ED for low oxygen saturations.  She was recently treated for pneumonia.  Here she was found to have recurrent pneumonia.  She was started on treatment for H CAP in the ED and hospitalist were called for admission.  On this writer's interview the patient she was confused and uncooperative, and unable  to contribute any information to her HPI.  She also refused to cooperate with physical exam.    HCAP (healthcare-associated pneumonia) - CT chest with new right-sided opacity -Repeat chest x-ray with no acute findings Continue IV antibiotics cefepime and discontinue vancomycin as MRSA PCR negative discharge patient with Omnicef  PRN supportive treatment  COPD exacerbation ([place]) - Clinically improving  IV Solu-Medrol tapering, discharge home with p.o. prednisone other supportive treatment as above, continue home inhalers  -Hyperkalemia Lokelma one-time dose and repeat labs potassium 4.2.  Acute psychosis could be from healthcare associated pneumonia  Psych consult placed-Dr. Leverne Humbles has seen the patient.  Haldol and Seroquel added to the regimen.  Seroquel 25 mg twice a day during daytime and 75 mg nightly.  Outpatient follow-up with the patient's primary psychiatry Home medication BuSpar discontinued Primary care physician to monitor EKG for prolonged QT intervals at this point benefits outweigh the risks  Diabetes mellitus type 2, controlled (HCC) -sliding scale insulin coverage during the hospital course and got modified diet at home  Anxiety -home dose anxiolytic BuSpar discontinued by psychiatry  GERD (gastroesophageal reflux disease) - PPI  Acute kidney injury onCKD (chronic kidney disease) stage 4, GFR 15-29 ml/min (HCC) -at baseline, avoid nephrotoxins and monitor.  Creatinine 2.02- 1.38.  Clinically improved  Generalized weakness physical therapy-no PT needs identified patient  has all equipment what is required at home Will arrange home health PT RN DISCHARGE CONDITIONS:   Fair   CONSULTS OBTAINED:  Treatment Team:  Lavella Hammock, MD   PROCEDURES none   DRUG ALLERGIES:   Allergies  Allergen Reactions  . Fentanyl Other (See Comments)    urine retention  . Penicillins Itching, Rash and Other (See Comments)    Has patient had a PCN reaction  causing immediate rash, facial/tongue/throat swelling, SOB or lightheadedness with hypotension: Yes Has patient had a PCN reaction causing severe rash involving mucus membranes or skin necrosis: No Has patient had a PCN reaction that required hospitalization: No Has patient had a PCN reaction occurring within the last 10 years: Yes If all of the above answers are "NO", then may proceed with Cephalosporin use.    DISCHARGE MEDICATIONS:   Allergies as of 07/21/2018      Reactions   Fentanyl Other (See Comments)   urine retention   Penicillins Itching, Rash, Other (See Comments)   Has patient had a PCN reaction causing immediate rash, facial/tongue/throat swelling, SOB or lightheadedness with hypotension: Yes Has patient had a PCN reaction causing severe rash involving mucus membranes or skin necrosis: No Has patient had a PCN reaction that required hospitalization: No Has patient had a PCN reaction occurring within the last 10 years: Yes If all of the above answers are "NO", then may proceed with Cephalosporin use.      Medication List    STOP taking these medications   busPIRone 10 MG tablet Commonly known as:  BUSPAR     TAKE these medications   acetaminophen 325 MG tablet Commonly known as:  TYLENOL Take 2 tablets (650 mg total) by mouth every 6 (six) hours as needed for mild pain (or Fever >/= 101). What changed:    medication strength  how much to take  reasons to take this   albuterol 108 (90 Base) MCG/ACT inhaler Commonly known as:  PROVENTIL HFA;VENTOLIN HFA Inhale 2 puffs into the lungs every 6 (six) hours as needed for wheezing or shortness of breath.   atorvastatin 20 MG tablet Commonly known as:  LIPITOR TAKE 1 TABLET (20 MG TOTAL) BY MOUTH AT BEDTIME.   cefdinir 300 MG capsule Commonly known as:  OMNICEF Take 1 capsule (300 mg total) by mouth daily for 4 days. Start taking on:  July 22, 2018   clopidogrel 75 MG tablet Commonly known as:   PLAVIX Take 1 tablet (75 mg total) by mouth daily.   donepezil 10 MG tablet Commonly known as:  ARICEPT Take 1 tablet (10 mg total) by mouth at bedtime.   famotidine 20 MG tablet Commonly known as:  PEPCID Take 1 tablet (20 mg total) by mouth at bedtime. To help prevent heartburn, reflux   FLUoxetine 10 MG capsule Commonly known as:  PROZAC Take 1 capsule (10 mg total) by mouth daily. Start taking on:  July 22, 2018   furosemide 20 MG tablet Commonly known as:  LASIX Take 40 mg by mouth daily.   gabapentin 300 MG capsule Commonly known as:  NEURONTIN Take 1 capsule (300 mg total) by mouth 3 (three) times daily.   HYDROcodone-acetaminophen 7.5-325 MG tablet Commonly known as:  NORCO Take 1 tablet by mouth every 8 (eight) hours as needed for up to 30 days. For chronic pain.  To fill on or after: 06-21-2018, 07-21-2018, 08-20-2018   ipratropium-albuterol 0.5-2.5 (3) MG/3ML Soln Commonly known as:  DUONEB INHALE THE CONTENTS  OF 1 VIAL VIA NEBULIZER THREE TIMES DAILY AS DIRECTED   metoprolol tartrate 50 MG tablet Commonly known as:  LOPRESSOR Take 1 tablet (50 mg total) by mouth 2 (two) times daily.   predniSONE 10 MG tablet Commonly known as:  DELTASONE Take 1 tablet (10 mg total) by mouth daily for 5 days.   QUEtiapine 25 MG tablet Commonly known as:  SEROQUEL Take 1 tablet (25 mg total) by mouth See admin instructions. Take 1 tablet in a.m. and 1 tablet in the afternoon and 3 tablets(75 mg total) before bedtime around 8 to 9 PM What changed:    medication strength  how much to take  when to take this  additional instructions   Zinc Oxide 13 % Crea Commonly known as:  DESITIN RAPID RELIEF Apply 1 application topically 3 (three) times daily as needed.        DISCHARGE INSTRUCTIONS:   Follow-up with primary care physician in 3 days Follow-up with outpatient psychiatrist in 1 week  DIET:  Diabetic diet  DISCHARGE CONDITION:  Fair  ACTIVITY:   Activity as tolerated  OXYGEN:  Home Oxygen: No.   Oxygen Delivery: room air  DISCHARGE LOCATION:  home   If you experience worsening of your admission symptoms, develop shortness of breath, life threatening emergency, suicidal or homicidal thoughts you must seek medical attention immediately by calling 911 or calling your MD immediately  if symptoms less severe.  You Must read complete instructions/literature along with all the possible adverse reactions/side effects for all the Medicines you take and that have been prescribed to you. Take any new Medicines after you have completely understood and accpet all the possible adverse reactions/side effects.   Please note  You were cared for by a hospitalist during your hospital stay. If you have any questions about your discharge medications or the care you received while you were in the hospital after you are discharged, you can call the unit and asked to speak with the hospitalist on call if the hospitalist that took care of you is not available. Once you are discharged, your primary care physician will handle any further medical issues. Please note that NO REFILLS for any discharge medications will be authorized once you are discharged, as it is imperative that you return to your primary care physician (or establish a relationship with a primary care physician if you do not have one) for your aftercare needs so that they can reassess your need for medications and monitor your lab values.     Today  Chief Complaint  Patient presents with  . Shortness of Breath  Patient is feeling much better more pleasant.  Repeat x-ray with no acute findings.  Will discharge patient to husband's care with home health.  Patient and husband are agreeable.   ROS:  CONSTITUTIONAL: Denies fevers, chills. Denies any fatigue, weakness.  EYES: Denies blurry vision, double vision, eye pain. EARS, NOSE, THROAT: Denies tinnitus, ear pain, hearing  loss. RESPIRATORY: Denies cough, wheeze, shortness of breath.  CARDIOVASCULAR: Denies chest pain, palpitations, edema.  GASTROINTESTINAL: Denies nausea, vomiting, diarrhea, abdominal pain. Denies bright red blood per rectum. GENITOURINARY: Denies dysuria, hematuria. ENDOCRINE: Denies nocturia or thyroid problems. HEMATOLOGIC AND LYMPHATIC: Denies easy bruising or bleeding. SKIN: Denies rash or lesion. MUSCULOSKELETAL: Denies pain in neck, back, shoulder, knees, hips or arthritic symptoms.  NEUROLOGIC: Denies paralysis, paresthesias.  PSYCHIATRIC: Denies anxiety or depressive symptoms.   VITAL SIGNS:  Blood pressure 123/64, pulse 68, temperature 97.8 F (36.6 C), temperature  source Oral, resp. rate 17, height 5\' 2"  (1.575 m), weight 71.7 kg, SpO2 98 %.  I/O:    Intake/Output Summary (Last 24 hours) at 07/21/2018 0943 Last data filed at 07/20/2018 1411 Gross per 24 hour  Intake 240 ml  Output -  Net 240 ml    PHYSICAL EXAMINATION:  GENERAL:  83 y.o.-year-old patient lying in the bed with no acute distress.  EYES: Pupils equal, round, reactive to light and accommodation. No scleral icterus. Extraocular muscles intact.  HEENT: Head atraumatic, normocephalic. Oropharynx and nasopharynx clear.  NECK:  Supple, no jugular venous distention. No thyroid enlargement, no tenderness.  LUNGS: Normal breath sounds bilaterally, no wheezing, rales,rhonchi or crepitation. No use of accessory muscles of respiration.  CARDIOVASCULAR: S1, S2 normal. No murmurs, rubs, or gallops.  ABDOMEN: Soft, non-tender, non-distended. Bowel sounds present.  EXTREMITIES: No pedal edema, cyanosis, or clubbing.  NEUROLOGIC: Awake, alert and oriented x2-3 sensation intact. Gait not checked.  PSYCHIATRIC: The patient is alert and oriented x 2-3.  SKIN: No obvious rash, lesion, or ulcer.   DATA REVIEW:   CBC Recent Labs  Lab 07/21/18 0453  WBC 11.2*  HGB 10.8*  HCT 34.4*  PLT 247    Chemistries  Recent  Labs  Lab 07/17/18 1719  07/21/18 0453  NA 139   < > 140  K 4.1   < > 4.2  CL 106   < > 108  CO2 25   < > 24  GLUCOSE 97   < > 129*  BUN 22   < > 31*  CREATININE 2.20*   < > 1.38*  CALCIUM 8.6*   < > 8.5*  AST 18  --   --   ALT 12  --   --   ALKPHOS 111  --   --   BILITOT 1.1  --   --    < > = values in this interval not displayed.    Cardiac Enzymes Recent Labs  Lab 07/17/18 1719  TROPONINI <0.03    Microbiology Results  Results for orders placed or performed during the hospital encounter of 07/17/18  Blood culture (routine x 2)     Status: None (Preliminary result)   Collection Time: 07/17/18  9:40 PM  Result Value Ref Range Status   Specimen Description BLOOD RIGHT ASSIST CONTROL  Final   Special Requests   Final    BOTTLES DRAWN AEROBIC AND ANAEROBIC Blood Culture results may not be optimal due to an excessive volume of blood received in culture bottles   Culture   Final    NO GROWTH 4 DAYS Performed at [facility], [address] [place], [place] [address]    Report Status PENDING  Incomplete  Blood culture (routine x 2)     Status: None (Preliminary result)   Collection Time: 07/17/18  9:40 PM  Result Value Ref Range Status   Specimen Description BLOOD LEFT ASSIST CONTROL  Final   Special Requests   Final    BOTTLES DRAWN AEROBIC AND ANAEROBIC Blood Culture results may not be optimal due to an excessive volume of blood received in culture bottles   Culture   Final    NO GROWTH 4 DAYS Performed at [facility], [address], [place], [place] [address]    Report Status PENDING  Incomplete  MRSA PCR Screening     Status: None   Collection Time: 07/19/18 12:10 PM  Result Value Ref Range Status   MRSA by PCR  NEGATIVE NEGATIVE Final    Comment:        The GeneXpert MRSA Assay (FDA approved for NASAL specimens only), is one component of a comprehensive MRSA colonization surveillance program. It is not intended to diagnose  MRSA infection nor to guide or monitor treatment for MRSA infections. Performed at [facility], [address] [place], [place] [address]     RADIOLOGY:  Dg Chest 2 View  Result Date: 07/20/2018 CLINICAL DATA:  Cough. EXAM: CHEST - 2 VIEW COMPARISON:  Radiographs of July 17, 2018. FINDINGS: The heart size and mediastinal contours are within normal limits. Atherosclerosis of thoracic aorta is noted. No pneumothorax or pleural effusion is noted. Minimal bibasilar subsegmental atelectasis or scarring is noted. Stable lower thoracic old compression fracture is noted. IMPRESSION: Minimal bibasilar subsegmental atelectasis or scarring. Aortic Atherosclerosis (ICD10-I70.0). Electronically Signed   By: Marijo Conception, M.D.   On: 07/20/2018 11:58   Dg Chest 2 View  Result Date: 07/17/2018 CLINICAL DATA:  Shortness of breath EXAM: CHEST - 2 VIEW COMPARISON:  07/02/2018 FINDINGS: Linear atelectasis or scarring at the left base. Right lung clear. Heart is borderline in size. No effusions or acute bony abnormality. IMPRESSION: Left basilar scarring or atelectasis.  No active disease. Electronically Signed   By: Rolm Baptise M.D.   On: 07/17/2018 18:39   Ct Chest Wo Contrast  Result Date: 07/17/2018 CLINICAL DATA:  Hypoxemia EXAM: CT CHEST WITHOUT CONTRAST TECHNIQUE: Multidetector CT imaging of the chest was performed following the standard protocol without IV contrast. COMPARISON:  Chest x-ray earlier today.  Chest CT 05/26/2018 FINDINGS: Cardiovascular: Moderate coronary artery and aortic calcifications. No aneurysm. Heart is normal size. Mediastinum/Nodes: Mildly prominent mediastinal lymph nodes are stable since recent study. The largest is a low right paratracheal lymph node with a short axis diameter of 14 mm. Higher right paratracheal lymph node has a short axis diameter of 11 mm. These are stable since recent study. No axillary or visible hilar adenopathy. Lungs/Pleura: Emphysema.  Medial left upper lobe nodule as a spiculated appearance and measures 13 x 9 mm compared to 12 x 8 mm previously, likely not significantly changed. Posterior left upper lobe nodule on image 51 measures 5 mm and is stable. Left lower lobe nodule measures 8 mm, stable. Rounded airspace opacity posteriorly in the right lower lobe, favor rounded atelectasis. Left base atelectasis or scarring is similar to prior study. Rounded subpleural nodule in the anterior right upper lobe measures 12 mm, stable. Trace right pleural effusion. Upper Abdomen: Imaging into the upper abdomen shows no acute findings. Prior cholecystectomy. Pneumobilia noted in the left hepatic lobe. Musculoskeletal: Chest wall soft tissues are unremarkable. Compression deformity at T12 is stable. No acute bony abnormality. IMPRESSION: Stable nodules in the lungs bilaterally. New rounded posterior right lower lobe opacity could reflect rounded atelectasis or pneumonia. Trace right pleural effusion. Stable mildly enlarged mediastinal lymph nodes. Moderate coronary artery disease. Aortic Atherosclerosis (ICD10-I70.0) and Emphysema (ICD10-J43.9). Electronically Signed   By: Rolm Baptise M.D.   On: 07/17/2018 21:13    EKG:   Orders placed or performed during the hospital encounter of 07/17/18  . ED EKG  . ED EKG  . EKG 12-Lead  . EKG 12-Lead  . EKG 12-Lead  . EKG 12-Lead      Management plans discussed with the patient, family and they are in agreement.  CODE STATUS:     Code Status Orders  (From admission, onward)  Start     Ordered   07/18/18 0442  Full code  Continuous     07/18/18 0441        Code Status History    Date Active Date Inactive Code Status Order ID Comments User Context   07/02/2018 5093 07/03/2018 1605 Full Code [id number]  Demetrios Loll, MD Inpatient   08/13/2017 0332 08/15/2017 1824 Full Code [id number]  Lance Coon, MD Inpatient   01/15/2015 2127 01/17/2015 1541 Full Code [id number]  Star Age, MD  Inpatient    Advance Directive Documentation     Most Recent Value  Type of Advance Directive  Healthcare Power of Attorney  Pre-existing out of facility DNR order (yellow form or pink MOST form)  -  "MOST" Form in Place?  -      TOTAL TIME TAKING CARE OF THIS PATIENT: 43  minutes.   Note: This dictation was prepared with Dragon dictation along with smaller phrase technology. Any transcriptional errors that result from this process are unintentional.   @[facility] @  on 07/21/2018 at 9:43 AM  Between 7am to 6pm - Pager - [phone]  After 6pm go to [email or web address] - password [email or web address] [facility]  Office  [phone]  CC: Primary care physician; Arnetha Courser, MD

## 2018-07-21 NOTE — Progress Notes (Signed)
Reviewed home meds prescriptions, discharge instructions and follow up appointments with husband and patient sister in law and all verbalized understanding

## 2018-07-22 DIAGNOSIS — J029 Acute pharyngitis, unspecified: Secondary | ICD-10-CM | POA: Diagnosis not present

## 2018-07-22 DIAGNOSIS — J440 Chronic obstructive pulmonary disease with acute lower respiratory infection: Secondary | ICD-10-CM | POA: Diagnosis not present

## 2018-07-22 DIAGNOSIS — J441 Chronic obstructive pulmonary disease with (acute) exacerbation: Secondary | ICD-10-CM | POA: Diagnosis not present

## 2018-07-22 DIAGNOSIS — J181 Lobar pneumonia, unspecified organism: Secondary | ICD-10-CM | POA: Diagnosis not present

## 2018-07-22 DIAGNOSIS — E1122 Type 2 diabetes mellitus with diabetic chronic kidney disease: Secondary | ICD-10-CM | POA: Diagnosis not present

## 2018-07-22 DIAGNOSIS — I129 Hypertensive chronic kidney disease with stage 1 through stage 4 chronic kidney disease, or unspecified chronic kidney disease: Secondary | ICD-10-CM | POA: Diagnosis not present

## 2018-07-22 DIAGNOSIS — F0390 Unspecified dementia without behavioral disturbance: Secondary | ICD-10-CM | POA: Diagnosis not present

## 2018-07-22 DIAGNOSIS — C642 Malignant neoplasm of left kidney, except renal pelvis: Secondary | ICD-10-CM | POA: Diagnosis not present

## 2018-07-22 DIAGNOSIS — N183 Chronic kidney disease, stage 3 (moderate): Secondary | ICD-10-CM | POA: Diagnosis not present

## 2018-07-22 LAB — CULTURE, BLOOD (ROUTINE X 2)
Culture: NO GROWTH
Culture: NO GROWTH

## 2018-07-24 ENCOUNTER — Telehealth: Payer: Self-pay | Admitting: Family Medicine

## 2018-07-24 ENCOUNTER — Telehealth: Payer: Self-pay

## 2018-07-24 LAB — URINE CULTURE: Culture: 20000 — AB

## 2018-07-24 NOTE — Telephone Encounter (Signed)
That's fine

## 2018-07-24 NOTE — Telephone Encounter (Signed)
Please schedule patient hosp f/u for 86mins on Thursday, thanks

## 2018-07-24 NOTE — Telephone Encounter (Signed)
Transition Care Management Follow-up Telephone Call  Date of discharge and from where: 07/21/18 [facility]  How have you been since you were released from the hospital? Pt states she is doing okay and no pain. Pt's husband said she had an episode yesterday of not talking and disorientation when she woke up but they called EMS and she was checked out and was told everything was okay.   Any questions or concerns? No   Items Reviewed:  Did the pt receive and understand the discharge instructions provided? Yes   Medications obtained and verified? Yes   Any new allergies since your discharge? No   Dietary orders reviewed? Yes  Do you have support at home? Yes   Functional Questionnaire: (I = Independent and D = Dependent) ADLs: D  Bathing/Dressing- D  Meal Prep- D  Eating- D  Maintaining continence- D  Transferring/Ambulation- D  Managing Meds- D  Follow up appointments reviewed:   PCP Hospital f/u appt confirmed? Yes  Scheduled to see Dr.Lada  on 07/27/18 @ 1:40.  [facility] f/u appt confirmed? Yes  Scheduled to see pulmonoloy on 07-27-18 @ 10:00.  Are transportation arrangements needed? No   If their condition worsens, is the pt aware to call PCP or go to the Emergency Dept.? Yes  Was the patient provided with contact information for the PCP's office or ED? Yes  Was to pt encouraged to call back with questions or concerns? Yes

## 2018-07-24 NOTE — Telephone Encounter (Signed)
Copied from [place] [phone]. Topic: Quick Communication - See Telephone Encounter >> Jul 24, 2018  3:45 PM Robina Ade, Helene Kelp D wrote: CRM for notification. See Telephone encounter for: 07/24/18. Ashleigh with [facility] call and would like new order for patient as follow: verbal order for skill nurse visit for 1 week 4 and 2 PRN. She can be reached at [phone]

## 2018-07-24 NOTE — Telephone Encounter (Signed)
Copied from [place] [id number]. Topic: General - Other >> Jul 24, 2018  8:56 AM Leward Quan A wrote: Reason for CRM: Patient husband called hoping to schedule a Hospital follow up appointment for her but was not able to do so. First available appointment was 08/08/2018 Mr Toulouse also stated that he spoke with Dr Sanda Klein and was informed that his wife had an appointment he was under the impression that it was today. Please advise Ph# [phone]

## 2018-07-25 ENCOUNTER — Emergency Department: Payer: Medicare HMO

## 2018-07-25 ENCOUNTER — Other Ambulatory Visit: Payer: Self-pay | Admitting: *Deleted

## 2018-07-25 ENCOUNTER — Other Ambulatory Visit: Payer: Medicare HMO

## 2018-07-25 ENCOUNTER — Telehealth: Payer: Self-pay | Admitting: Family Medicine

## 2018-07-25 ENCOUNTER — Observation Stay
Admission: EM | Admit: 2018-07-25 | Discharge: 2018-07-27 | Disposition: A | Discharge status: Home Health | Payer: Medicare HMO | Attending: Internal Medicine | Admitting: Internal Medicine

## 2018-07-25 ENCOUNTER — Encounter: Payer: Self-pay | Admitting: Emergency Medicine

## 2018-07-25 ENCOUNTER — Other Ambulatory Visit: Payer: Self-pay

## 2018-07-25 ENCOUNTER — Other Ambulatory Visit: Payer: Self-pay | Admitting: Family Medicine

## 2018-07-25 DIAGNOSIS — J449 Chronic obstructive pulmonary disease, unspecified: Secondary | ICD-10-CM | POA: Diagnosis present

## 2018-07-25 DIAGNOSIS — N390 Urinary tract infection, site not specified: Secondary | ICD-10-CM | POA: Insufficient documentation

## 2018-07-25 DIAGNOSIS — F411 Generalized anxiety disorder: Secondary | ICD-10-CM | POA: Diagnosis not present

## 2018-07-25 DIAGNOSIS — E1122 Type 2 diabetes mellitus with diabetic chronic kidney disease: Secondary | ICD-10-CM | POA: Insufficient documentation

## 2018-07-25 DIAGNOSIS — J9601 Acute respiratory failure with hypoxia: Secondary | ICD-10-CM

## 2018-07-25 DIAGNOSIS — Z66 Do not resuscitate: Secondary | ICD-10-CM | POA: Insufficient documentation

## 2018-07-25 DIAGNOSIS — C642 Malignant neoplasm of left kidney, except renal pelvis: Secondary | ICD-10-CM | POA: Diagnosis not present

## 2018-07-25 DIAGNOSIS — Z87891 Personal history of nicotine dependence: Secondary | ICD-10-CM | POA: Insufficient documentation

## 2018-07-25 DIAGNOSIS — Z89612 Acquired absence of left leg above knee: Secondary | ICD-10-CM | POA: Diagnosis not present

## 2018-07-25 DIAGNOSIS — Z86718 Personal history of other venous thrombosis and embolism: Secondary | ICD-10-CM | POA: Diagnosis not present

## 2018-07-25 DIAGNOSIS — R918 Other nonspecific abnormal finding of lung field: Secondary | ICD-10-CM | POA: Diagnosis not present

## 2018-07-25 DIAGNOSIS — Z885 Allergy status to narcotic agent status: Secondary | ICD-10-CM | POA: Insufficient documentation

## 2018-07-25 DIAGNOSIS — E785 Hyperlipidemia, unspecified: Secondary | ICD-10-CM | POA: Diagnosis not present

## 2018-07-25 DIAGNOSIS — R4182 Altered mental status, unspecified: Secondary | ICD-10-CM | POA: Diagnosis not present

## 2018-07-25 DIAGNOSIS — F329 Major depressive disorder, single episode, unspecified: Secondary | ICD-10-CM | POA: Insufficient documentation

## 2018-07-25 DIAGNOSIS — K219 Gastro-esophageal reflux disease without esophagitis: Secondary | ICD-10-CM | POA: Diagnosis not present

## 2018-07-25 DIAGNOSIS — R0902 Hypoxemia: Secondary | ICD-10-CM | POA: Diagnosis not present

## 2018-07-25 DIAGNOSIS — M79605 Pain in left leg: Secondary | ICD-10-CM | POA: Diagnosis not present

## 2018-07-25 DIAGNOSIS — Z515 Encounter for palliative care: Secondary | ICD-10-CM

## 2018-07-25 DIAGNOSIS — I129 Hypertensive chronic kidney disease with stage 1 through stage 4 chronic kidney disease, or unspecified chronic kidney disease: Secondary | ICD-10-CM | POA: Diagnosis not present

## 2018-07-25 DIAGNOSIS — B9620 Unspecified Escherichia coli [E. coli] as the cause of diseases classified elsewhere: Secondary | ICD-10-CM | POA: Diagnosis not present

## 2018-07-25 DIAGNOSIS — J029 Acute pharyngitis, unspecified: Secondary | ICD-10-CM | POA: Diagnosis not present

## 2018-07-25 DIAGNOSIS — N183 Chronic kidney disease, stage 3 (moderate): Secondary | ICD-10-CM | POA: Diagnosis not present

## 2018-07-25 DIAGNOSIS — Z794 Long term (current) use of insulin: Secondary | ICD-10-CM | POA: Insufficient documentation

## 2018-07-25 DIAGNOSIS — G4089 Other seizures: Secondary | ICD-10-CM | POA: Diagnosis not present

## 2018-07-25 DIAGNOSIS — N184 Chronic kidney disease, stage 4 (severe): Secondary | ICD-10-CM | POA: Diagnosis not present

## 2018-07-25 DIAGNOSIS — R404 Transient alteration of awareness: Secondary | ICD-10-CM | POA: Diagnosis not present

## 2018-07-25 DIAGNOSIS — Z880 Allergy status to penicillin: Secondary | ICD-10-CM | POA: Diagnosis not present

## 2018-07-25 DIAGNOSIS — F0390 Unspecified dementia without behavioral disturbance: Secondary | ICD-10-CM | POA: Diagnosis not present

## 2018-07-25 DIAGNOSIS — Z7189 Other specified counseling: Secondary | ICD-10-CM

## 2018-07-25 DIAGNOSIS — Z833 Family history of diabetes mellitus: Secondary | ICD-10-CM | POA: Insufficient documentation

## 2018-07-25 DIAGNOSIS — J440 Chronic obstructive pulmonary disease with acute lower respiratory infection: Secondary | ICD-10-CM | POA: Diagnosis not present

## 2018-07-25 DIAGNOSIS — E119 Type 2 diabetes mellitus without complications: Secondary | ICD-10-CM

## 2018-07-25 DIAGNOSIS — R569 Unspecified convulsions: Secondary | ICD-10-CM | POA: Diagnosis not present

## 2018-07-25 DIAGNOSIS — Z1612 Extended spectrum beta lactamase (ESBL) resistance: Secondary | ICD-10-CM | POA: Insufficient documentation

## 2018-07-25 DIAGNOSIS — E1165 Type 2 diabetes mellitus with hyperglycemia: Secondary | ICD-10-CM | POA: Diagnosis not present

## 2018-07-25 DIAGNOSIS — J181 Lobar pneumonia, unspecified organism: Secondary | ICD-10-CM | POA: Diagnosis not present

## 2018-07-25 DIAGNOSIS — J441 Chronic obstructive pulmonary disease with (acute) exacerbation: Secondary | ICD-10-CM | POA: Diagnosis not present

## 2018-07-25 LAB — CBC WITH DIFFERENTIAL/PLATELET
Abs Immature Granulocytes: 0.13 10*3/uL — ABNORMAL HIGH (ref 0.00–0.07)
Basophils Absolute: 0 10*3/uL (ref 0.0–0.1)
Basophils Relative: 0 %
Eosinophils Absolute: 0.1 10*3/uL (ref 0.0–0.5)
Eosinophils Relative: 1 %
HEMATOCRIT: 37.4 % (ref 36.0–46.0)
Hemoglobin: 12.2 g/dL (ref 12.0–15.0)
Immature Granulocytes: 1 %
LYMPHS ABS: 2.5 10*3/uL (ref 0.7–4.0)
Lymphocytes Relative: 17 %
MCH: 30 pg (ref 26.0–34.0)
MCHC: 32.6 g/dL (ref 30.0–36.0)
MCV: 92.1 fL (ref 80.0–100.0)
Monocytes Absolute: 1.2 10*3/uL — ABNORMAL HIGH (ref 0.1–1.0)
Monocytes Relative: 8 %
Neutro Abs: 10.6 10*3/uL — ABNORMAL HIGH (ref 1.7–7.7)
Neutrophils Relative %: 73 %
Platelets: 245 10*3/uL (ref 150–400)
RBC: 4.06 MIL/uL (ref 3.87–5.11)
RDW: 16.3 % — ABNORMAL HIGH (ref 11.5–15.5)
WBC: 14.6 10*3/uL — ABNORMAL HIGH (ref 4.0–10.5)
nRBC: 0 % (ref 0.0–0.2)

## 2018-07-25 LAB — COMPREHENSIVE METABOLIC PANEL
ALT: 37 U/L (ref 0–44)
AST: 34 U/L (ref 15–41)
Albumin: 2.9 g/dL — ABNORMAL LOW (ref 3.5–5.0)
Alkaline Phosphatase: 73 U/L (ref 38–126)
Anion gap: 11 (ref 5–15)
BUN: 28 mg/dL — ABNORMAL HIGH (ref 8–23)
CO2: 25 mmol/L (ref 22–32)
Calcium: 8.2 mg/dL — ABNORMAL LOW (ref 8.9–10.3)
Chloride: 103 mmol/L (ref 98–111)
Creatinine, Ser: 1.79 mg/dL — ABNORMAL HIGH (ref 0.44–1.00)
GFR calc Af Amer: 30 mL/min — ABNORMAL LOW (ref >60)
GFR calc non Af Amer: 26 mL/min — ABNORMAL LOW (ref >60)
Glucose, Bld: 143 mg/dL — ABNORMAL HIGH (ref 70–99)
Potassium: 4.1 mmol/L (ref 3.5–5.1)
Sodium: 139 mmol/L (ref 135–145)
Total Bilirubin: 1.1 mg/dL (ref 0.3–1.2)
Total Protein: 6.2 g/dL — ABNORMAL LOW (ref 6.5–8.1)

## 2018-07-25 MED ORDER — LIDOCAINE HCL (PF) 1 % IJ SOLN
INTRAMUSCULAR | Status: AC
  Administered 2018-07-25: 5 mL via INTRADERMAL
  Filled 2018-07-25: qty 5

## 2018-07-25 MED ORDER — LIDOCAINE HCL (PF) 1 % IJ SOLN
5.0000 mL | Freq: Once | INTRAMUSCULAR | Status: AC
  Administered 2018-07-25: 5 mL via INTRADERMAL

## 2018-07-25 MED ORDER — SULFAMETHOXAZOLE-TRIMETHOPRIM 400-80 MG PO TABS: 1.0000 | ORAL_TABLET | Freq: Two times a day (BID) | ORAL | 0 refills | Status: DC

## 2018-07-25 NOTE — ED Notes (Signed)
Pt oxygen saturation 88% on room air. Pt placed on 2L at this time. Oxygen saturation currently 95%.

## 2018-07-25 NOTE — Telephone Encounter (Signed)
Copied from [place] [id number]. Topic: Quick Communication - Home Health Verbal Orders >> Jul 25, 2018  2:07 PM Rutherford Nail, NT wrote: Caller/Agency: [facility] Number: [phone] (can leave a voicemail) Requesting OT/PT/Skilled Nursing/Social Work: PT Frequency:  2x a week for 1 week 1x a week for 2 weeks  Report: O2 fluctuating- today at her visit it was 83-88%. Lungs sound clear. Was not sent on home from the hospital on o2. Would like to know if this can be assessed at her visit on Thursday 07/27/2018?

## 2018-07-25 NOTE — Progress Notes (Signed)
Switching abx

## 2018-07-25 NOTE — Progress Notes (Signed)
Joelene Millin, please let the patient know that the antibiotic she has been taking for the bladder infection is not going to kill this particular infection. Stop that medicine if she has any left and start the new medicine I'm sending. It is really important to stay well hydrated.

## 2018-07-25 NOTE — ED Notes (Signed)
Patient transported to CT 

## 2018-07-25 NOTE — ED Notes (Signed)
Pt currently alert and oriented x4. Pt responding to this RN and Dr. Ruffin Frederick questions appropriately. This RN will continue to monitor.

## 2018-07-25 NOTE — Telephone Encounter (Signed)
She needs to go back to the hospital Her pulse ox was 98% on her discharge summary That is a substantial drop; 83-88% is significantly low and a huge change from when she left the hospital  Fine for therapy orders though

## 2018-07-25 NOTE — Patient Outreach (Signed)
[facility]) Care Management  07/25/2018  Amy [name] 1936-03-12 [id number]   EMMI-general discharge RED ON EMMI ALERT Day # 1 Date: 07/24/18 1048  Red Alert Reason: Scheduled follow-up?No Epic indicates an appointment with Dr Sanda Klein on 3/5/202    Insurance: [facility] admissions  ED visits in the last 6 months  Was 84% nebulizer 90 %   Outreach attempt # 1 successful to Amy [name]  Patient's husband Amy [name] is able to verify HIPAA as Amy [name] has dementia She was noted to scream out inappropriately x 1 during the call [place] [facility] RN reviewed and addressed red alertwith Amy [name]  EMMI Amy [name] confirms that Amy [name] does have a scheduled follow up appointment on 07/27/2018  Amy Isley shared with CM that Amy [name] has had low oxygen saturation rates in the 80s.He reports that the [facility] PT worked with Amy [name] and the oxygen saturations went back to the 90s Amy Riches voiced his concerns  CM reviewed Epic notes and discussed that Dr Sanda Klein did provide an order for oxygen at 07/25/2018 1650 CM assessed pt for s/s of hypoxia. CM reviewed s/s to assess for related to hypoxia with Amy [name] (cyanosis, lethargy, poor coordination. Breathing concerns) Amy Nordhoff voiced understanding He denied noted cyanosis when checking Amy [name] fingertips as instructed by CM  He reports he feels Amy [name] is better after her oxygen sat went up with HHPT   CM encouraged Amy Faulstich to follow up with MD about oxygen DME, to encourage Amy Senat to take deep breaths, , to encourage her to remain active and to keep her head elevated He voiced understanding and appreciation for the EMMI f/u call  Social: Amy [name] lives at home with her 73 year old husband as her primary care giver. She has 3 sons but they are not active in her care. One son lives locally and others in [place] per Amy [name] "They have busy lives" Amy [name]  reports Amy [name] is assist/dependent in all care. He reports having to assist with transfers, and other ADLS He reports having to pay for Amy [name] to be placed in [place] [facility] in December 2019 after he sustained broken ribs He reports he is using his SSI money to pay for the care of his wife   Conditions: DM type 2 COPD, PVD, allergic rhinitis, hx of PNA, GERD, fatty infiltration of liver, CKD, late onset alzheimer's disease with behavioral disturbance, anxiety cataract, chronic pain syndrome hx renal cancer hx of falls s/ps L AKA    DME nebulizer, oxygen, w/c, shower chair, bathtub lift dentures., cbg meter Medications: denies concerns with taking medications as prescribed, affording medications, side effects of medications and questions about medications    Appointments: last seen by Dr Sanda Klein on 07/12/2018  09/19/2018 1345 Dr [facility]   Advance Directives: POA Amy Sharrow and there is a living will   Consent: [name] RN CM reviewed [facility] services with patient. Patient gave verbal consent for services.  Advised patient that there will be further automated EMMI- post discharge calls to assess how the patient is doing following the recent hospitalization Advised the patient that another call may be received from a nurse if any of their responses were abnormal. Patient voiced understanding and was appreciative of f/u call.   Plan: [facility] RN CM will close case at this time as patient has been assessed and no needs identified/needs resolved.   Pt  encouraged to return a call to [place] CM prn-also gave 24 hr nurse call center nmber    Route to Dr Lynann Bologna L. Lavina Hamman, RN, BSN, [place] Coordinator Office number [phone] Mobile number [phone]  Main [facility] number [phone] Fax number [phone]

## 2018-07-25 NOTE — ED Notes (Addendum)
Pt son at bedside stating "stick her, I don't care if she says no". This RN and Dr. Quentin Cornwall at bedside explaining to family that we cannot stick patient against her wishes and pt has every right to refuse treatment and we legally cannot force a pt to do something against their will. Son of pt raising voice stating "Well I just dont understand how you can be an emergency room and not do your job, that just does not make sense". Pt stating "I am in my right mind I can refuse this blood work if I want to". Son raised his voice at mother stating "No you aren't but I am and you are going to do this". Dr. Quentin Cornwall at bedside to talking to pt. Dr. Quentin Cornwall explained the need for blood work to patient and the importance of the blood work related to her care. Pt agreeable at this time. Charge nurse, Remo Lipps RN aware of pt's son's behavior at this time. This RN will continue to monitor.

## 2018-07-25 NOTE — Telephone Encounter (Signed)
Then let's get her on supplemental oxygen Please get home oxygen ordered, use whatever facility can get the oxygen the fastest Dx: hypoxia See sats from today for the qualifying numbers Start 2 liters/minute

## 2018-07-25 NOTE — ED Provider Notes (Addendum)
[facility] Emergency Department Provider Note    None    (approximate)  I have reviewed the triage vital signs and the nursing notes.   HISTORY  Chief Complaint Altered Mental Status    HPI Amy [name] is a 83 y.o. female status post recent admission the hospital for pneumonia presents the ER after having several episodes of "blank stare and unresponsiveness ".  This was noticed first yesterday by family members.  EMS reports that they noted several episodes that did appear similar to seizure-like activity.  She was however found to be hypoxic to 87% on room air.  She is not scheduled to receive her home oxygen until tomorrow.  States they have been taking antibiotics as prescribed.  She denies any pain.  She states that she does not want any "needles or IVs."   Past Medical History:  Diagnosis Date  . Anemia    low iron  . Anxiety   . Arthritis   . Bilateral cataracts   . Bronchitis    hx of   . Cataract   . Chronic kidney disease   . Depression   . Diabetes mellitus without complication ([place])   . 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 GERD (gastroesophageal reflux disease)   . Headache   . History of frequent urinary tract infections   . Hx of renal cell cancer    LEFT  . Hyperlipidemia   . Hypertension   . Numbness and tingling    right hand   . Obesity   . Osteopenia 10/05/2017  . Peripheral artery disease ([place])   . Urinary frequency   . Urinary incontinence    Family History  Problem Relation Age of Onset  . Cancer Mother        Stomach  . Cancer Father   . Diabetes Brother   . Cancer Brother        oral  . Healthy Son   . Healthy Sister    Past Surgical History:  Procedure Laterality Date  . ABDOMINAL HYSTERECTOMY  1978  . CARPAL TUNNEL RELEASE Bilateral   . CHOLECYSTECTOMY    . COLONOSCOPY    . COLONOSCOPY WITH ESOPHAGOGASTRODUODENOSCOPY (EGD)    . ERCP N/A  08/19/2017   Procedure: ENDOSCOPIC RETROGRADE CHOLANGIOPANCREATOGRAPHY (ERCP);  Surgeon: Arta Silence, MD;  Location: [facility] ENDOSCOPY;  Service: Endoscopy;  Laterality: N/A;  . LEG AMPUTATION     above the knee / left   . ROBOT ASSISTED LAPAROSCOPIC NEPHRECTOMY Left 01/15/2015   Procedure: ROBOTIC ASSISTED LAPAROSCOPIC RADICAL NEPHRECTOMY;  Surgeon: Alexis Frock, MD;  Location: [facility] ORS;  Service: Urology;  Laterality: Left;  . stent placement right leg      Patient Active Problem List   Diagnosis Date Noted  . Goals of care, counseling/discussion   . Palliative care by specialist   . Hypoxia 07/26/2018  . HCAP (healthcare-associated pneumonia) 07/02/2018  . Hypoxemia   . COPD exacerbation ([place])   . Renal cell carcinoma ([place])   . Lung nodule 04/23/2018  . Hypertensive renal disease 11/08/2017  . Secondary hyperparathyroidism of renal origin ([place]) 11/08/2017  . QT prolongation 10/27/2017  . Osteopenia 10/05/2017  . Choledocholithiasis 08/13/2017  . Chronic pain syndrome 04/05/2017  . Depression 11/15/2016  . Iron deficiency anemia 09/08/2016  . Anemia 06/29/2016  . Late onset Alzheimer's disease with behavioral disturbance ([place]) 06/14/2016  . S/P AKA (above knee amputation)  unilateral, left ([place]) 06/08/2016  . Wheelchair dependent 03/11/2016  . 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 S/p nephrectomy 01/29/2015  . Renal neoplasm 01/15/2015  . Abnormal EKG 01/02/2015  . Renal mass 12/05/2014  . GERD (gastroesophageal reflux disease) 11/20/2014  . GAD (generalized anxiety disorder) 11/20/2014  . AB (asthmatic bronchitis) 11/20/2014  . Aortic atherosclerosis ([place]) 11/20/2014  . Cataract 11/20/2014  . Leg pain 11/20/2014  . Continuous opioid dependence ([place]) 11/20/2014  . CKD (chronic kidney disease) stage 4, GFR 15-29 ml/min (HCC) 11/20/2014  . COPD (chronic obstructive pulmonary disease) ([place]) 11/20/2014  . Excessive falling 11/20/2014  . Fatty  infiltration of liver 11/20/2014  . Hyperlipidemia 11/20/2014  . Anterior knee pain 11/20/2014  . LBP (low back pain) 11/20/2014  . Malaise and fatigue 11/20/2014  . Abnormal presence of protein in urine 11/20/2014  . PVD (peripheral vascular disease) ([place]) 11/20/2014  . Allergic rhinitis 11/20/2014  . At risk for falling 11/20/2014  . Phantom limb syndrome with pain ([place]) 11/05/2014  . Anxiety 11/05/2014  . Elevated serum creatinine 11/05/2014  . Diabetes mellitus type 2, controlled ([place]) 11/05/2014  . H/O adenomatous polyp of colon 07/27/2010      Prior to Admission medications   Medication Sig Start Date End Date Taking? Authorizing Provider  acetaminophen (TYLENOL) 325 MG tablet Take 2 tablets (650 mg total) by mouth every 6 (six) hours as needed for mild pain (or Fever >/= 101). 07/21/18  Yes Gouru, Aruna, MD  albuterol (PROVENTIL HFA;VENTOLIN HFA) 108 (90 Base) MCG/ACT inhaler Inhale 2 puffs into the lungs every 6 (six) hours as needed for wheezing or shortness of breath. 06/15/17  Yes Keith Rake Asad A, MD  atorvastatin (LIPITOR) 20 MG tablet TAKE 1 TABLET (20 MG TOTAL) BY MOUTH AT BEDTIME. 07/12/18  Yes Lada, Satira Anis, MD  clopidogrel (PLAVIX) 75 MG tablet Take 1 tablet (75 mg total) by mouth daily. 07/28/17  Yes Dew, Erskine Squibb, MD  donepezil (ARICEPT) 10 MG tablet Take 1 tablet (10 mg total) by mouth at bedtime. 10/06/17  Yes Lada, Satira Anis, MD  famotidine (PEPCID) 20 MG tablet Take 1 tablet (20 mg total) by mouth at bedtime. To help prevent heartburn, reflux 04/05/18  Yes Lada, Satira Anis, MD  FLUoxetine (PROZAC) 10 MG capsule Take 1 capsule (10 mg total) by mouth daily. 07/22/18  Yes Gouru, Illene Silver, MD  gabapentin (NEURONTIN) 300 MG capsule Take 1 capsule (300 mg total) by mouth 3 (three) times daily. 06/21/18  Yes Gillis Santa, MD  HYDROcodone-acetaminophen (NORCO) 7.5-325 MG tablet Take 1 tablet by mouth every 8 (eight) hours as needed for up to 30 days. For chronic pain.  To fill on  or after: 06-21-2018, 07-21-2018, 08-20-2018 07/21/18 08/20/18 Yes Lateef, Carlus Pavlov, MD  ipratropium-albuterol (DUONEB) 0.5-2.5 (3) MG/3ML SOLN INHALE THE CONTENTS OF 1 VIAL VIA NEBULIZER THREE TIMES DAILY AS DIRECTED Patient taking differently: Inhale 3 mLs into the lungs 3 (three) times daily.  11/10/17  Yes Laverle Hobby, MD  Zinc Oxide (DESITIN RAPID RELIEF) 13 % CREA Apply 1 application topically 3 (three) times daily as needed. 04/25/18  Yes Gregor Hams, MD  QUEtiapine (SEROQUEL) 25 MG tablet Take 1 tablet (25 mg total) by mouth See admin instructions. Take 1 tablet in a.m. and 1 tablet in the afternoon and 2 tablets(50 mg total) before bedtime around 8 to 9 PM 07/27/18   Vaughan Basta, MD  sulfamethoxazole-trimethoprim (BACTRIM) 400-80 MG tablet Take 1 tablet by mouth 2 (  two) times daily for 7 days. 07/27/18 08/03/18  Vaughan Basta, MD    Allergies Fentanyl and Penicillins    Social History Social History   Tobacco Use  . Smoking status: Former Smoker    Packs/day: 1.50    Years: 51.00    Pack years: 76.50    Types: Cigarettes    Last attempt to quit: 05/24/2004    Years since quitting: 14.1  . Smokeless tobacco: Never Used  . Tobacco comment: smoking cessation materials not required  Substance Use Topics  . Alcohol use: Not Currently  . Drug use: No    Review of Systems Patient denies headaches, rhinorrhea, blurry vision, numbness, shortness of breath, chest pain, edema, cough, abdominal pain, nausea, vomiting, diarrhea, dysuria, fevers, rashes or hallucinations unless otherwise stated above in HPI. ____________________________________________   PHYSICAL EXAM:  VITAL SIGNS: Vitals:   07/26/18 1955 07/27/18 0601  BP: 126/74 (!) 101/58  Pulse: 89 71  Resp: 19 16  Temp: 97.9 F (36.6 C) 98.1 F (36.7 C)  SpO2: 94% 91%    Constitutional: Alert and oriented.  Eyes: Conjunctivae are normal.  Head: Atraumatic. Nose: No  congestion/rhinnorhea. Mouth/Throat: Mucous membranes are moist.   Neck: No stridor. Painless ROM.  Cardiovascular: Normal rate, regular rhythm. Grossly normal heart sounds.  Good peripheral circulation. Respiratory: Normal respiratory effort.  No retractions. coarse bibasilar breathsounds Gastrointestinal: Soft and nontender. No distention. No abdominal bruits. No CVA tenderness. Genitourinary:  Musculoskeletal: No lower extremity tenderness nor edema.  No joint effusions. Neurologic:  Normal speech and language. No gross focal neurologic deficits are appreciated. No facial droop Skin:  Skin is warm, dry and intact. No rash noted. Psychiatric: anxious appearing ____________________________________________   LABS (all labs ordered are listed, but only abnormal results are displayed)  No results found for this or any previous visit (from the past 24 hour(s)). ____________________________________________ ED ECG REPORT I, Merlyn Lot, the attending physician, personally viewed and interpreted this ECG.   Date: 07/26/2018  EKG Time: 00:51  Rate: 70  Rhythm: sinus  Axis: normal   Intervals: normal intervals,   ST&T Change: no stemi  ____________________________________________  RADIOLOGY  I personally reviewed all radiographic images ordered to evaluate for the above acute complaints and reviewed radiology reports and findings.  These findings were personally discussed with the patient.  Please see medical record for radiology report.  ____________________________________________   PROCEDURES  Procedure(s) performed:  .Critical Care Performed by: Merlyn Lot, MD Authorized by: Merlyn Lot, MD   Critical care provider statement:    Critical care time (minutes):  30   Critical care time was exclusive of:  Separately billable procedures and treating other patients   Critical care was necessary to treat or prevent imminent or life-threatening deterioration of  the following conditions:  Respiratory failure   Critical care was time spent personally by me on the following activities:  Development of treatment plan with patient or surrogate, discussions with consultants, evaluation of patient's response to treatment, examination of patient, obtaining history from patient or surrogate, ordering and performing treatments and interventions, ordering and review of laboratory studies, ordering and review of radiographic studies, pulse oximetry, re-evaluation of patient's condition and review of old charts   Due to difficulty with obtaining IV access, a 20G peripheral IV catheter was inserted using US guidance into the RUE.  The site was prepped with chlorhexidine and allowed to dry.  The patient tolerated the procedure without any complications.    Critical Care performed: yes ____________________________________________  INITIAL IMPRESSION / ASSESSMENT AND PLAN / ED COURSE  Pertinent labs & imaging results that were available during my care of the patient were reviewed by me and considered in my medical decision making (see chart for details).   DDX: Dehydration, sepsis, pna, uti, hypoglycemia, cva, drug effect, withdrawal, encephalitis   MEIGHAN [name] is a 83 y.o. who presents to the ED with symptoms as described above.  Patient noted to be hypoxic requiring supplemental oxygen.  Likely secondary to recent pneumonia.  No evidence of congestive heart failure.  Does not appear overtly septic.  Neuro exam is nonfocal currently.  Patient anxious about receiving blood work but after that described my concern then recommendation for blood work and further evaluation patient agrees.  Clinical Course as of Jul 31 2337  Tue Jul 25, 2018  2355 But it does show a rising white count but no other signs of sepsis.  Neuro exam remains nonfocal.  She is not had any of these episodes since being in the ER.  CT imaging does not show any evidence of bleed.  Based on  her persistent hypoxia do feel patient requires hospitalization for further medical management.   [PR]    Clinical Course User Index [PR] Merlyn Lot, MD     As part of my medical decision making, I reviewed the following data within the [place] notes reviewed and incorporated, Labs reviewed, notes from prior ED visits and [place] Controlled Substance Database   ____________________________________________   FINAL CLINICAL IMPRESSION(S) / ED DIAGNOSES  Final diagnoses:  Acute respiratory failure with hypoxia ([place])      NEW MEDICATIONS STARTED DURING THIS VISIT:  Discharge Medication List as of 07/27/2018  4:15 PM       Note:  This document was prepared using Dragon voice recognition software and may include unintentional dictation errors.    Merlyn Lot, MD 07/26/18 Drema Halon    Merlyn Lot, MD 08/01/18 [phone]

## 2018-07-25 NOTE — ED Notes (Signed)
Pt refusing to have IV placed inserted or blood drawn. Pt states "I'm okay, I'm okay". Md notified.

## 2018-07-25 NOTE — Telephone Encounter (Signed)
Amy [name] notified of verbal orders and Dr. Delight Ovens recommendation for ER.  Patient husband contacted and told to take her to ER, he refused and states he can not take her up there 3 times a week, she just got out.  If she needs oxygen then someone needs to put her on it.  She has appt here Thursday and with pulm.

## 2018-07-25 NOTE — ED Triage Notes (Signed)
Pt from home via [facility] with c/o altered mental status. EMS states that when they arrived on scene, pt appeared to be post-ictal. EMS state spt appeared to be blank staring and not talking to them. EMS reports pt had 2-3 episodes of blank staring for them. No hx of seizures. blood sugar 275. Pt currently alert and responding to questions for this RN.

## 2018-07-25 NOTE — Telephone Encounter (Signed)
Verbal orders given to Ashleigh with [facility] per Dr. Sanda Klein.

## 2018-07-26 ENCOUNTER — Other Ambulatory Visit: Payer: Self-pay

## 2018-07-26 DIAGNOSIS — R0902 Hypoxemia: Secondary | ICD-10-CM | POA: Diagnosis present

## 2018-07-26 DIAGNOSIS — J449 Chronic obstructive pulmonary disease, unspecified: Secondary | ICD-10-CM | POA: Diagnosis not present

## 2018-07-26 DIAGNOSIS — N390 Urinary tract infection, site not specified: Secondary | ICD-10-CM | POA: Diagnosis not present

## 2018-07-26 DIAGNOSIS — E119 Type 2 diabetes mellitus without complications: Secondary | ICD-10-CM | POA: Diagnosis not present

## 2018-07-26 LAB — BASIC METABOLIC PANEL
Anion gap: 11 (ref 5–15)
BUN: 28 mg/dL — ABNORMAL HIGH (ref 8–23)
CO2: 26 mmol/L (ref 22–32)
Calcium: 8.2 mg/dL — ABNORMAL LOW (ref 8.9–10.3)
Chloride: 103 mmol/L (ref 98–111)
Creatinine, Ser: 1.87 mg/dL — ABNORMAL HIGH (ref 0.44–1.00)
GFR calc non Af Amer: 25 mL/min — ABNORMAL LOW (ref >60)
GFR, EST AFRICAN AMERICAN: 29 mL/min — AB (ref >60)
Glucose, Bld: 97 mg/dL (ref 70–99)
Potassium: 4.1 mmol/L (ref 3.5–5.1)
Sodium: 140 mmol/L (ref 135–145)

## 2018-07-26 LAB — GLUCOSE, CAPILLARY
Glucose-Capillary: 91 mg/dL (ref 70–99)
Glucose-Capillary: 175 mg/dL — ABNORMAL HIGH (ref 70–99)
Glucose-Capillary: 109 mg/dL — ABNORMAL HIGH (ref 70–99)

## 2018-07-26 LAB — CBC
HCT: 37.8 % (ref 36.0–46.0)
HEMOGLOBIN: 12.1 g/dL (ref 12.0–15.0)
MCH: 29.9 pg (ref 26.0–34.0)
MCHC: 32 g/dL (ref 30.0–36.0)
MCV: 93.3 fL (ref 80.0–100.0)
NRBC: 0.2 % (ref 0.0–0.2)
Platelets: 226 10*3/uL (ref 150–400)
RBC: 4.05 MIL/uL (ref 3.87–5.11)
RDW: 16.4 % — ABNORMAL HIGH (ref 11.5–15.5)
WBC: 13.2 10*3/uL — ABNORMAL HIGH (ref 4.0–10.5)

## 2018-07-26 MED ORDER — ACETAMINOPHEN 325 MG PO TABS: 650.0000 mg | ORAL_TABLET | Freq: Four times a day (QID) | ORAL | Status: DC | PRN

## 2018-07-26 MED ORDER — IPRATROPIUM-ALBUTEROL 0.5-2.5 (3) MG/3ML IN SOLN: 3.0000 mL | RESPIRATORY_TRACT | Status: DC | PRN

## 2018-07-26 MED ORDER — CLOPIDOGREL BISULFATE 75 MG PO TABS
75.0000 mg | ORAL_TABLET | Freq: Every day | ORAL | Status: DC
  Administered 2018-07-26 – 2018-07-27 (×2): 75 mg via ORAL
  Filled 2018-07-26 (×2): qty 1

## 2018-07-26 MED ORDER — ACETAMINOPHEN 650 MG RE SUPP: 650.0000 mg | Freq: Four times a day (QID) | RECTAL | Status: DC | PRN

## 2018-07-26 MED ORDER — FLUOXETINE HCL 10 MG PO CAPS
10.0000 mg | ORAL_CAPSULE | Freq: Every day | ORAL | Status: DC
  Administered 2018-07-26 – 2018-07-27 (×2): 10 mg via ORAL
  Filled 2018-07-26 (×3): qty 1

## 2018-07-26 MED ORDER — HEPARIN SODIUM (PORCINE) 5000 UNIT/ML IJ SOLN
5000.0000 [IU] | Freq: Three times a day (TID) | INTRAMUSCULAR | Status: DC
  Administered 2018-07-26 – 2018-07-27 (×2): 5000 [IU] via SUBCUTANEOUS
  Filled 2018-07-26 (×3): qty 1

## 2018-07-26 MED ORDER — ATORVASTATIN CALCIUM 20 MG PO TABS
20.0000 mg | ORAL_TABLET | Freq: Every day | ORAL | Status: DC
  Administered 2018-07-26 (×2): 20 mg via ORAL
  Filled 2018-07-26 (×2): qty 1

## 2018-07-26 MED ORDER — QUETIAPINE FUMARATE 25 MG PO TABS
25.0000 mg | ORAL_TABLET | Freq: Every day | ORAL | Status: DC
  Administered 2018-07-26: 23:00:00 25 mg via ORAL
  Filled 2018-07-26: qty 1

## 2018-07-26 MED ORDER — SODIUM CHLORIDE 0.9 % IV SOLN
500.0000 mg | Freq: Two times a day (BID) | INTRAVENOUS | Status: DC
  Administered 2018-07-26 – 2018-07-27 (×3): 500 mg via INTRAVENOUS
  Filled 2018-07-26: qty 0.5
  Filled 2018-07-26 (×3): qty 500
  Filled 2018-07-26 (×2): qty 0.5

## 2018-07-26 MED ORDER — INSULIN ASPART 100 UNIT/ML ~~LOC~~ SOLN
0.0000 [IU] | Freq: Three times a day (TID) | SUBCUTANEOUS | Status: DC
  Administered 2018-07-26: 12:00:00 2 [IU] via SUBCUTANEOUS
  Filled 2018-07-26: qty 1

## 2018-07-26 MED ORDER — IPRATROPIUM-ALBUTEROL 0.5-2.5 (3) MG/3ML IN SOLN: 3.0000 mL | Freq: Four times a day (QID) | RESPIRATORY_TRACT | Status: DC

## 2018-07-26 NOTE — Care Management Obs Status (Signed)
[facility] NOTIFICATION   Patient Details  Name: MCKINZE POIRIER MRN: [id number] Date of Birth: May 28, 1935   Medicare Observation Status Notification Given:  Yes    Shelbie Ammons, RN 07/26/2018, 1:49 PM

## 2018-07-26 NOTE — Progress Notes (Deleted)
[facility]     Assessment and Plan:  Chronic bronchitis with dyspnea on exertion. -Dyspnea on exertion is likely multifactorial from underlying respiratory disease, as well as deconditioning, obesity.  Patient is wheelchair-bound. - Continue nebs three times daily. Recommended flutter valve.   Dementia. - instructions were given to the patient's husband who not acknowledged understanding.  Lung nodule. -There is slow/minimal growth of a right pleural-based nodule over the last 1 year, currently 7 mm in size. -Patient is currently being followed at the cancer center, can continue surveillance scanning every 6-12 months.    No follow-ups on file.    Date: 07/26/2018  MRN# [id number] Amy [name] 07-30-1935    Amy [name] is a 83 y.o. old female seen in consultation for chief complaint of:    No chief complaint on file.   HPI:  The patient is a 83 yo female, she has dementia, and last visit she was having symptoms of chronic bronchitis, excess mucus secretions, dyspnea on exertion.  She was started on nebulized duo nebs to be used in combination with flutter valve 3 times daily. Patient has significant difficulty understanding instructions, and providing history.  Therefore history is obtained from the chart, and from staff.  Since her last visit the breathing has been doing her nebs 2-3 times per day but stopped using the flutter valve because she did not like it.   She has a lung nodule, she is followed by cancer center in [place].   They have an albuterol inhaler currently but she is not using it correctly so stopped using it.   Images personally reviewed, CT chest 08/12/17 in comparison with previous 02/16/17 there is a tiny right pleural  based anterior nodule increased from 6.6 mm to 7.2 mm, though appears grossly unchanged. Previous CT 08/16/16 showed 6 mm nodule.   Medication:   No current facility-administered medications for this visit.   No current outpatient medications on file.  Facility-Administered Medications Ordered in Other Visits:  .  acetaminophen (TYLENOL) tablet 650 mg, 650 mg, Oral, Q6H PRN **OR** acetaminophen (TYLENOL) suppository 650 mg, 650 mg, Rectal, Q6H PRN, Lance Coon, MD .  atorvastatin (LIPITOR) tablet 20 mg, 20 mg, Oral, Corwin Levins, MD, 20 mg at 07/26/18 [phone] .  clopidogrel (PLAVIX) tablet 75 mg, 75 mg, Oral, Daily, Lance Coon, MD, 75 mg at 07/26/18 1209 .  heparin injection 5,000 Units, 5,000 Units, Subcutaneous, Q8H, Lance Coon, MD .  insulin aspart (novoLOG) injection 0-9 Units, 0-9 Units, Subcutaneous, TID WC, Lance Coon, MD, 2 Units at 07/26/18 1209 .  ipratropium-albuterol (DUONEB) 0.5-2.5 (3) MG/3ML nebulizer solution 3 mL, 3 mL, Nebulization, Q4H PRN, Vaughan Basta, MD .  meropenem (MERREM) 500 mg in sodium chloride 0.9 % 100 mL IVPB, 500 mg, Intravenous, Q12H, Lance Coon, MD, Last Rate: 200 mL/hr at 07/26/18 0904, 500 mg at 07/26/18 0904   Allergies:  Fentanyl and Penicillins    LABORATORY PANEL:   CBC Recent Labs  Lab 07/26/18 0531  WBC 13.2*  HGB 12.1  HCT 37.8  PLT 226   ------------------------------------------------------------------------------------------------------------------  Chemistries  Recent Labs  Lab 07/25/18 2219 07/26/18 0531  NA 139 140  K 4.1 4.1  CL 103 103  CO2 25 26  GLUCOSE 143* 97  BUN 28* 28*  CREATININE 1.79* 1.87*  CALCIUM 8.2* 8.2*  AST 34  --   ALT 37  --   ALKPHOS 73  --   BILITOT 1.1  --    ------------------------------------------------------------------------------------------------------------------  Cardiac Enzymes No results for input(s): TROPONINI in the last 168 hours. ------------------------------------------------------------  RADIOLOGY:  Ct Head Wo Contrast  Result Date: 07/25/2018 CLINICAL DATA:  New onset seizure. Altered mental status. EXAM: CT HEAD WITHOUT CONTRAST TECHNIQUE:  Contiguous axial images were obtained from the base of the skull through the vertex without intravenous contrast. COMPARISON:  08/15/2014 head CT FINDINGS: Brain: There is no mass, hemorrhage or extra-axial collection. Hypodensity of the white matter is most commonly associated with chronic microvascular disease. There are old bilateral basal ganglia lacunar infarcts. There is an old right frontal infarct and generalized age advanced atrophy. Vascular: Atherosclerotic calcification of the vertebral and internal carotid arteries at the skull base. No abnormal hyperdensity of the major intracranial arteries or dural venous sinuses. Skull: The visualized skull base, calvarium and extracranial soft tissues are normal. Sinuses/Orbits: No fluid levels or advanced mucosal thickening of the visualized paranasal sinuses. No mastoid or middle ear effusion. The orbits are normal. IMPRESSION: 1. No acute intracranial abnormality. 2. Findings of chronic microvascular ischemia and age advanced atrophy with multiple old infarcts Electronically Signed   By: Ulyses Jarred M.D.   On: 07/25/2018 23:54   Dg Chest Portable 1 View  Result Date: 07/25/2018 CLINICAL DATA:  Altered mental status. Weakness. Clinical concern for pneumonia. EXAM: PORTABLE CHEST 1 VIEW COMPARISON:  Radiograph 07/20/2018, CT 07/17/2018 FINDINGS: Slight improved right basilar aeration from prior with residual atelectasis or scarring. Scarring in the left lower lobe. No acute airspace disease. Unchanged heart size and mediastinal contours. No pulmonary edema. Left upper lobe pulmonary nodule not well demonstrated radiographically. No pneumothorax. Remote right rib fracture. Bifid anterior right sixth rib. IMPRESSION: Improved right basilar opacity from prior exam with residual atelectasis or scarring. No new airspace disease. Electronically Signed   By: Keith Rake M.D.   On: 07/25/2018 21:45       Thank  you for the consultation and for allowing  [facility] to assist in the care of your patient. Our recommendations are noted above.  Please contact us if we can be of further service.   Marda Stalker, M.D., F.C.C.P.  Board Certified in Internal Medicine, Pulmonary Medicine, [place], and Sleep Medicine.  [facility] Office Number: [phone]  07/26/2018

## 2018-07-26 NOTE — Progress Notes (Addendum)
[facility] NAME: Amy [name]    MR#:  [id number]  DATE OF BIRTH:  [birth year]  SUBJECTIVE:  CHIEF COMPLAINT:   Chief Complaint  Patient presents with  . Altered Mental Status   Brought with complaint of hypoxia from primary care doctor's office.  She was also recently treated for UTI but found to have ESBL in her culture. Feels comfortable.  As per husband she uses her inhalers and nebulizer but her oxygen level drops frequently.  Per husband she needs help with any movements.  She has amputation and does need support to transfer from bed to wheelchair.  REVIEW OF SYSTEMS:  CONSTITUTIONAL: No fever, have fatigue or weakness.  EYES: No blurred or double vision.  EARS, NOSE, AND THROAT: No tinnitus or ear pain.  RESPIRATORY: No cough, shortness of breath, wheezing or hemoptysis.  CARDIOVASCULAR: No chest pain, orthopnea, edema.  GASTROINTESTINAL: No nausea, vomiting, diarrhea or abdominal pain.  GENITOURINARY: No dysuria, hematuria.  ENDOCRINE: No polyuria, nocturia,  HEMATOLOGY: No anemia, easy bruising or bleeding SKIN: No rash or lesion. MUSCULOSKELETAL: No joint pain or arthritis.   NEUROLOGIC: No tingling, numbness, weakness.  PSYCHIATRY: No anxiety or depression.   ROS  DRUG ALLERGIES:   Allergies  Allergen Reactions  . Fentanyl Other (See Comments)    urine retention  . Penicillins Itching, Rash and Other (See Comments)    Has patient had a PCN reaction causing immediate rash, facial/tongue/throat swelling, SOB or lightheadedness with hypotension: Yes Has patient had a PCN reaction causing severe rash involving mucus membranes or skin necrosis: No Has patient had a PCN reaction that required hospitalization: No Has patient had a PCN reaction occurring within the last 10 years: Yes If all of the above answers are "NO", then may proceed with Cephalosporin use.    VITALS:  Blood pressure (!) 122/56, pulse 75,  temperature 98.1 F (36.7 C), temperature source Oral, resp. rate 18, height 5\' 2"  (1.575 m), weight 71.7 kg, SpO2 93 %.  PHYSICAL EXAMINATION:  GENERAL:  83 y.o.-year-old patient lying in the bed with no acute distress.  EYES: Pupils equal, round, reactive to light and accommodation. No scleral icterus. Extraocular muscles intact.  HEENT: Head atraumatic, normocephalic. Oropharynx and nasopharynx clear.  NECK:  Supple, no jugular venous distention. No thyroid enlargement, no tenderness.  LUNGS: Normal breath sounds bilaterally, no wheezing, rales,rhonchi or crepitation. No use of accessory muscles of respiration.  CARDIOVASCULAR: S1, S2 normal. No murmurs, rubs, or gallops.  ABDOMEN: Soft, nontender, nondistended. Bowel sounds present. No organomegaly or mass.  EXTREMITIES: No pedal edema, cyanosis, or clubbing.  Status post amputation NEUROLOGIC: Cranial nerves II through XII are intact. Muscle strength 4/5 in all extremities. Sensation intact. Gait not checked.  PSYCHIATRIC: The patient is alert and oriented x 2.  SKIN: No obvious rash, lesion, or ulcer.   Physical Exam LABORATORY PANEL:   CBC Recent Labs  Lab 07/26/18 0531  WBC 13.2*  HGB 12.1  HCT 37.8  PLT 226   ------------------------------------------------------------------------------------------------------------------  Chemistries  Recent Labs  Lab 07/25/18 2219 07/26/18 0531  NA 139 140  K 4.1 4.1  CL 103 103  CO2 25 26  GLUCOSE 143* 97  BUN 28* 28*  CREATININE 1.79* 1.87*  CALCIUM 8.2* 8.2*  AST 34  --   ALT 37  --   ALKPHOS 73  --   BILITOT 1.1  --    ------------------------------------------------------------------------------------------------------------------  Cardiac Enzymes No results for input(s):  TROPONINI in the last 168 hours. ------------------------------------------------------------------------------------------------------------------  RADIOLOGY:  Ct Head Wo Contrast  Result  Date: 07/25/2018 CLINICAL DATA:  New onset seizure. Altered mental status. EXAM: CT HEAD WITHOUT CONTRAST TECHNIQUE: Contiguous axial images were obtained from the base of the skull through the vertex without intravenous contrast. COMPARISON:  08/15/2014 head CT FINDINGS: Brain: There is no mass, hemorrhage or extra-axial collection. Hypodensity of the white matter is most commonly associated with chronic microvascular disease. There are old bilateral basal ganglia lacunar infarcts. There is an old right frontal infarct and generalized age advanced atrophy. Vascular: Atherosclerotic calcification of the vertebral and internal carotid arteries at the skull base. No abnormal hyperdensity of the major intracranial arteries or dural venous sinuses. Skull: The visualized skull base, calvarium and extracranial soft tissues are normal. Sinuses/Orbits: No fluid levels or advanced mucosal thickening of the visualized paranasal sinuses. No mastoid or middle ear effusion. The orbits are normal. IMPRESSION: 1. No acute intracranial abnormality. 2. Findings of chronic microvascular ischemia and age advanced atrophy with multiple old infarcts Electronically Signed   By: Ulyses Jarred M.D.   On: 07/25/2018 23:54   Dg Chest Portable 1 View  Result Date: 07/25/2018 CLINICAL DATA:  Altered mental status. Weakness. Clinical concern for pneumonia. EXAM: PORTABLE CHEST 1 VIEW COMPARISON:  Radiograph 07/20/2018, CT 07/17/2018 FINDINGS: Slight improved right basilar aeration from prior with residual atelectasis or scarring. Scarring in the left lower lobe. No acute airspace disease. Unchanged heart size and mediastinal contours. No pulmonary edema. Left upper lobe pulmonary nodule not well demonstrated radiographically. No pneumothorax. Remote right rib fracture. Bifid anterior right sixth rib. IMPRESSION: Improved right basilar opacity from prior exam with residual atelectasis or scarring. No new airspace disease. Electronically Signed    By: Keith Rake M.D.   On: 07/25/2018 21:45    ASSESSMENT AND PLAN:   Principal Problem:   Hypoxia Active Problems:   Diabetes mellitus type 2, controlled (HCC)   GERD (gastroesophageal reflux disease)   GAD (generalized anxiety disorder)   CKD (chronic kidney disease) stage 4, GFR 15-29 ml/min (HCC)   COPD (chronic obstructive pulmonary disease) (HCC)   Hyperlipidemia   * Hypoxia -suspect this is due to potentially some intermittent bronchospasm in the setting of recent pneumonia.  Try to taper supplemental oxygen to room air and check for qualification of receiving oxygen at home if possible.  *  ESBL E. coli UTI -  IV meropenem while here in the hospital, this can be switched to p.o. antibiotic on discharge  *  Diabetes mellitus type 2, controlled ([place]) -sliding scale insulin coverage *  COPD (chronic obstructive pulmonary disease) ([place]) -continue home meds *  GERD (gastroesophageal reflux disease) -home dose PPI *  GAD (generalized anxiety disorder) -home dose anxiolytic *  CKD (chronic kidney disease) stage 4, GFR 15-29 ml/min (HCC) -at baseline, avoid nephrotoxins and monitor *  Hyperlipidemia -home dose antilipid *Hallucination and behavioral issues-she was started on Seroquel in last admission by psych and as per Dr. Weber Cooks note, try to taper when possible. We will cut down the dose in this admission while in hospital.  As she has recurrent admissions to the hospital and seems to be having progressive decline in abilities, I will call palliative care consult.  All the records are reviewed and case discussed with Care Management/Social Workerr. Management plans discussed with the patient, family and they are in agreement.  CODE STATUS: Full code.  TOTAL TIME TAKING CARE OF THIS PATIENT: 45 minutes.  Patient has been was present in the room during my visit.  POSSIBLE D/C IN 1-2 DAYS, DEPENDING ON CLINICAL CONDITION.   Vaughan Basta M.D on 07/26/2018    Between 7am to 6pm - Pager - [phone]  After 6pm go to [email or web address] - password [email or web address] [facility]  Office  [phone]  CC: Primary care physician; Arnetha Courser, MD  Note: This dictation was prepared with Dragon dictation along with smaller phrase technology. Any transcriptional errors that result from this process are unintentional.

## 2018-07-26 NOTE — ED Notes (Signed)
Admitting doctor at bedside 

## 2018-07-26 NOTE — Telephone Encounter (Signed)
Yes, pulse ox of 83% in the note yesterday from home health Form completed and signed and given to [place]

## 2018-07-26 NOTE — Progress Notes (Signed)
Pharmacy Antibiotic Note  Amy [name] is a 83 y.o. female admitted on 07/25/2018 with ESBL infection.  Pharmacy has been consulted for meropenem dosing.  Plan: Meropenem 500 m q 12 hours ordered  Height: 5\' 2"  (157.5 cm) Weight: 158 lb (71.7 kg) IBW/kg (Calculated) : 50.1  Temp (24hrs), Avg:98.5 F (36.9 C), Min:98.5 F (36.9 C), Max:98.5 F (36.9 C)  Recent Labs  Lab 07/19/18 0816 07/20/18 0431 07/21/18 0453 07/25/18 2219  WBC  --  12.5* 11.2* 14.6*  CREATININE 1.73* 1.53* 1.38* 1.79*    Estimated Creatinine Clearance: 23.2 mL/min (A) (by C-G formula based on SCr of 1.79 mg/dL (H)).    Allergies  Allergen Reactions  . Fentanyl Other (See Comments)    urine retention  . Penicillins Itching, Rash and Other (See Comments)    Has patient had a PCN reaction causing immediate rash, facial/tongue/throat swelling, SOB or lightheadedness with hypotension: Yes Has patient had a PCN reaction causing severe rash involving mucus membranes or skin necrosis: No Has patient had a PCN reaction that required hospitalization: No Has patient had a PCN reaction occurring within the last 10 years: Yes If all of the above answers are "NO", then may proceed with Cephalosporin use.    Antimicrobials this admission: meropenem 3/4 >>    >>   Dose adjustments this admission:   Microbiology results:   Thank you for allowing pharmacy to be a part of this patient's care.  Stylianos Stradling S 07/26/2018 2:56 AM

## 2018-07-26 NOTE — ED Notes (Signed)
Pt given ginger ale to drink. MD aware.

## 2018-07-26 NOTE — H&P (Signed)
[facility] [place] NAME: Amy [name]    MR#:  [id number]  DATE OF BIRTH:  09/02/1935  DATE OF ADMISSION:  07/25/2018  PRIMARY CARE PHYSICIAN: Arnetha Courser, MD   REQUESTING/REFERRING PHYSICIAN: Quentin Cornwall, MD  CHIEF COMPLAINT:   Chief Complaint  Patient presents with  . Altered Mental Status    HISTORY OF PRESENT ILLNESS:  Amy [name]  is a 83 y.o. female who presents with chief complaint as above.  Presents the ED after an episode of decreased responsiveness and hypoxia.  She was recently admitted here and treated for pneumonia.  She was discharged back home.  She and her family at bedside states that she was diagnosed with UTI, given some other antibiotic, they are not sure which one.  Work-up here in the ED is largely within normal limits at this time.  Chart review shows that her UTI was from ESBL E. coli.  Hospitalist were called for admission  PAST MEDICAL HISTORY:   Past Medical History:  Diagnosis Date  . Anemia    low iron  . Anxiety   . Arthritis   . Bilateral cataracts   . Bronchitis    hx of   . Cataract   . Chronic kidney disease   . Depression   . Diabetes mellitus without complication ([place])   . 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 GERD (gastroesophageal reflux disease)   . Headache   . History of frequent urinary tract infections   . Hx of renal cell cancer    LEFT  . Hyperlipidemia   . Hypertension   . Numbness and tingling    right hand   . Obesity   . Osteopenia 10/05/2017  . Peripheral artery disease ([place])   . Urinary frequency   . Urinary incontinence      PAST SURGICAL HISTORY:   Past Surgical History:  Procedure Laterality Date  . ABDOMINAL HYSTERECTOMY  1978  . CARPAL TUNNEL RELEASE Bilateral   . CHOLECYSTECTOMY    . COLONOSCOPY    . COLONOSCOPY WITH ESOPHAGOGASTRODUODENOSCOPY (EGD)    . ERCP N/A 08/19/2017   Procedure:  ENDOSCOPIC RETROGRADE CHOLANGIOPANCREATOGRAPHY (ERCP);  Surgeon: Arta Silence, MD;  Location[facility] END[facility]COPY;  Service: Endoscopy;  Laterality: N/A;  . LEG AMPUTATION     above the knee / left   . ROBOT ASSISTED LAPAROSCOPIC NEPHRECTOMY Left 01/15/2015   Procedure: ROBOTIC ASSISTED LAPAROSCOPIC RADICAL NEPHRECTOMY;  Surgeon: Alexis Frock, MD;  Location: [facility] ORS;  Service: Urology;  Laterality: Left;  . stent placement right leg        SOCIAL HISTORY:   Social History   Tobacco Use  . Smoking status: Former Smoker    Packs/day: 1.50    Years: 51.00    Pack years: 76.50    Types: Cigarettes    Last attempt to quit: 05/24/2004    Years since quitting: 14.1  . Smokeless tobacco: Never Used  . Tobacco comment: smoking cessation materials not required  Substance Use Topics  . Alcohol use: Not Currently     FAMILY HISTORY:   Family History  Problem Relation Age of Onset  . Cancer Mother        Stomach  . Cancer Father   . Diabetes Brother   . Cancer Brother        oral  . Healthy Son   . Healthy Sister  DRUG ALLERGIES:   Allergies  Allergen Reactions  . Fentanyl Other (See Comments)    urine retention  . Penicillins Itching, Rash and Other (See Comments)    Has patient had a PCN reaction causing immediate rash, facial/tongue/throat swelling, SOB or lightheadedness with hypotension: Yes Has patient had a PCN reaction causing severe rash involving mucus membranes or skin necrosis: No Has patient had a PCN reaction that required hospitalization: No Has patient had a PCN reaction occurring within the last 10 years: Yes If all of the above answers are "NO", then may proceed with Cephalosporin use.    MEDICATIONS AT HOME:   Prior to Admission medications   Medication Sig Start Date End Date Taking? Authorizing Provider  acetaminophen (TYLENOL) 325 MG tablet Take 2 tablets (650 mg total) by mouth every 6 (six) hours as needed for mild pain (or Fever >/= 101). 07/21/18    Gouru, Aruna, MD  albuterol (PROVENTIL HFA;VENTOLIN HFA) 108 (90 Base) MCG/ACT inhaler Inhale 2 puffs into the lungs every 6 (six) hours as needed for wheezing or shortness of breath. 06/15/17   Roselee Nova, MD  atorvastatin (LIPITOR) 20 MG tablet TAKE 1 TABLET (20 MG TOTAL) BY MOUTH AT BEDTIME. 07/12/18   Lada, Satira Anis, MD  cefdinir (OMNICEF) 300 MG capsule Take 1 capsule (300 mg total) by mouth daily for 4 days. 07/22/18 07/26/18  Nicholes Mango, MD  clopidogrel (PLAVIX) 75 MG tablet Take 1 tablet (75 mg total) by mouth daily. 07/28/17   Algernon Huxley, MD  donepezil (ARICEPT) 10 MG tablet Take 1 tablet (10 mg total) by mouth at bedtime. 10/06/17   Arnetha Courser, MD  famotidine (PEPCID) 20 MG tablet Take 1 tablet (20 mg total) by mouth at bedtime. To help prevent heartburn, reflux 04/05/18   Lada, Satira Anis, MD  FLUoxetine (PROZAC) 10 MG capsule Take 1 capsule (10 mg total) by mouth daily. 07/22/18   Nicholes Mango, MD  furosemide (LASIX) 20 MG tablet Take 40 mg by mouth daily.  09/02/17   [provider]  gabapentin (NEURONTIN) 300 MG capsule Take 1 capsule (300 mg total) by mouth 3 (three) times daily. 06/21/18   Gillis Santa, MD  HYDROcodone-acetaminophen (NORCO) 7.5-325 MG tablet Take 1 tablet by mouth every 8 (eight) hours as needed for up to 30 days. For chronic pain.  To fill on or after: 06-21-2018, 07-21-2018, 08-20-2018 07/21/18 08/20/18  Gillis Santa, MD  ipratropium-albuterol (DUONEB) 0.5-2.5 (3) MG/3ML SOLN INHALE THE CONTENTS OF 1 VIAL VIA NEBULIZER THREE TIMES DAILY AS DIRECTED Patient taking differently: Inhale 3 mLs into the lungs 3 (three) times daily.  11/10/17   Laverle Hobby, MD  metoprolol tartrate (LOPRESSOR) 50 MG tablet Take 1 tablet (50 mg total) by mouth 2 (two) times daily. 06/15/17   Roselee Nova, MD  predniSONE (DELTASONE) 10 MG tablet Take 1 tablet (10 mg total) by mouth daily for 5 days. 07/21/18 07/26/18  Nicholes Mango, MD  QUEtiapine (SEROQUEL) 25 MG tablet  Take 1 tablet (25 mg total) by mouth See admin instructions. Take 1 tablet in a.m. and 1 tablet in the afternoon and 3 tablets(75 mg total) before bedtime around 8 to 9 PM 07/21/18   Gouru, Aruna, MD  sulfamethoxazole-trimethoprim (BACTRIM) 400-80 MG tablet Take 1 tablet by mouth 2 (two) times daily. 07/25/18   Arnetha Courser, MD  Zinc Oxide (DESITIN RAPID RELIEF) 13 % CREA Apply 1 application topically 3 (three) times daily as needed. 04/25/18  Gregor Hams, MD    REVIEW OF SYSTEMS:  Review of Systems  Constitutional: Negative for chills, fever, malaise/fatigue and weight loss.  HENT: Negative for ear pain, hearing loss and tinnitus.   Eyes: Negative for blurred vision, double vision, pain and redness.  Respiratory: Negative for cough, hemoptysis and shortness of breath.   Cardiovascular: Negative for chest pain, palpitations, orthopnea and leg swelling.  Gastrointestinal: Negative for abdominal pain, constipation, diarrhea, nausea and vomiting.  Genitourinary: Negative for dysuria, frequency and hematuria.  Musculoskeletal: Negative for back pain, joint pain and neck pain.  Skin:       No acne, rash, or lesions  Neurological: Negative for dizziness, tremors, focal weakness and weakness.  Endo/Heme/Allergies: Negative for polydipsia. Does not bruise/bleed easily.  Psychiatric/Behavioral: Negative for depression. The patient is not nervous/anxious and does not have insomnia.      VITAL SIGNS:   Vitals:   07/25/18 2130 07/25/18 2200 07/25/18 2256 07/26/18 0000  BP:  108/74 (!) 108/52 (!) 100/55  Pulse:  72 67 65  Resp:  20 18 20   Temp: 98.5 F (36.9 C)     TempSrc: Oral     SpO2:  96% 97% 97%  Weight:      Height:       Wt Readings from Last 3 Encounters:  07/25/18 71.7 kg  07/17/18 71.7 kg  07/12/18 72 kg    PHYSICAL EXAMINATION:  Physical Exam  Vitals reviewed. Constitutional: She is oriented to person, place, and time. She appears well-developed and well-nourished.  No distress.  HENT:  Head: Normocephalic and atraumatic.  Mouth/Throat: Oropharynx is clear and moist.  Eyes: Pupils are equal, round, and reactive to light. Conjunctivae and EOM are normal. No scleral icterus.  Neck: Normal range of motion. Neck supple. No JVD present. No thyromegaly present.  Cardiovascular: Normal rate, regular rhythm and intact distal pulses. Exam reveals no gallop and no friction rub.  No murmur heard. Respiratory: Effort normal and breath sounds normal. No respiratory distress. She has no wheezes. She has no rales.  GI: Soft. Bowel sounds are normal. She exhibits no distension. There is no abdominal tenderness.  Musculoskeletal: Normal range of motion.        General: No edema.     Comments: No arthritis, no gout  Lymphadenopathy:    She has no cervical adenopathy.  Neurological: She is alert and oriented to person, place, and time. No cranial nerve deficit.  No dysarthria, no aphasia  Skin: Skin is warm and dry. No rash noted. No erythema.  Psychiatric: She has a normal mood and affect. Her behavior is normal. Judgment and thought content normal.    LABORATORY PANEL:   CBC Recent Labs  Lab 07/25/18 2219  WBC 14.6*  HGB 12.2  HCT 37.4  PLT 245   ------------------------------------------------------------------------------------------------------------------  Chemistries  Recent Labs  Lab 07/25/18 2219  NA 139  K 4.1  CL 103  CO2 25  GLUCOSE 143*  BUN 28*  CREATININE 1.79*  CALCIUM 8.2*  AST 34  ALT 37  ALKPHOS 73  BILITOT 1.1   ------------------------------------------------------------------------------------------------------------------  Cardiac Enzymes No results for input(s): TROPONINI in the last 168 hours. ------------------------------------------------------------------------------------------------------------------  RADIOLOGY:  Ct Head Wo Contrast  Result Date: 07/25/2018 CLINICAL DATA:  New onset seizure. Altered mental  status. EXAM: CT HEAD WITHOUT CONTRAST TECHNIQUE: Contiguous axial images were obtained from the base of the skull through the vertex without intravenous contrast. COMPARISON:  08/15/2014 head CT FINDINGS: Brain: There is no mass,  hemorrhage or extra-axial collection. Hypodensity of the white matter is most commonly associated with chronic microvascular disease. There are old bilateral basal ganglia lacunar infarcts. There is an old right frontal infarct and generalized age advanced atrophy. Vascular: Atherosclerotic calcification of the vertebral and internal carotid arteries at the skull base. No abnormal hyperdensity of the major intracranial arteries or dural venous sinuses. Skull: The visualized skull base, calvarium and extracranial soft tissues are normal. Sinuses/Orbits: No fluid levels or advanced mucosal thickening of the visualized paranasal sinuses. No mastoid or middle ear effusion. The orbits are normal. IMPRESSION: 1. No acute intracranial abnormality. 2. Findings of chronic microvascular ischemia and age advanced atrophy with multiple old infarcts Electronically Signed   By: Ulyses Jarred M.D.   On: 07/25/2018 23:54   Dg Chest Portable 1 View  Result Date: 07/25/2018 CLINICAL DATA:  Altered mental status. Weakness. Clinical concern for pneumonia. EXAM: PORTABLE CHEST 1 VIEW COMPARISON:  Radiograph 07/20/2018, CT 07/17/2018 FINDINGS: Slight improved right basilar aeration from prior with residual atelectasis or scarring. Scarring in the left lower lobe. No acute airspace disease. Unchanged heart size and mediastinal contours. No pulmonary edema. Left upper lobe pulmonary nodule not well demonstrated radiographically. No pneumothorax. Remote right rib fracture. Bifid anterior right sixth rib. IMPRESSION: Improved right basilar opacity from prior exam with residual atelectasis or scarring. No new airspace disease. Electronically Signed   By: Keith Rake M.D.   On: 07/25/2018 21:45    EKG:    Orders placed or performed during the hospital encounter of 07/25/18  . ED EKG  . ED EKG    IMPRESSION AND PLAN:  Principal Problem:   Hypoxia -suspect this is due to potentially some intermittent bronchospasm in the setting of recent pneumonia.  I suspect her episode of decreased responsiveness was due to this transient hypoxia.  She likely needs to have some supplemental oxygen at home for the next short period of time.  We will keep her on supplemental oxygen here for now. Active Problems:   ESBL E. coli UTI -we will start IV meropenem while here in the hospital, this can be switched to p.o. antibiotic on discharge   Diabetes mellitus type 2, controlled ([place]) -sliding scale insulin coverage   COPD (chronic obstructive pulmonary disease) ([place]) -continue home meds   GERD (gastroesophageal reflux disease) -home dose PPI   GAD (generalized anxiety disorder) -home dose anxiolytic   CKD (chronic kidney disease) stage 4, GFR 15-29 ml/min (HCC) -at baseline, avoid nephrotoxins and monitor   Hyperlipidemia -home dose antilipid  Chart review performed and case discussed with ED provider. Labs, imaging and/or ECG reviewed by provider and discussed with patient/family. Management plans discussed with the patient and/or family.  DVT PROPHYLAXIS: SubQ lovenox   GI PROPHYLAXIS:  PPI   ADMISSION STATUS: Observation  CODE STATUS: Full Code Status History    Date Active Date Inactive Code Status Order ID Comments User Context   07/18/2018 0441 07/21/2018 2325 Full Code [id number]  Lance Coon, MD ED   07/02/2018 1224 07/03/2018 1605 Full Code [id number]  Demetrios Loll, MD Inpatient   08/13/2017 0332 08/15/2017 1824 Full Code [id number]  Lance Coon, MD Inpatient   01/15/2015 2127 01/17/2015 1541 Full Code [id number]  Star Age, MD Inpatient    Advance Directive Documentation     Most Recent Value  Type of Advance Directive  Living will, Healthcare Power of Attorney  Pre-existing out of facility  DNR order (yellow form or pink MOST form)  -  "MOST"  Form in Place?  -      TOTAL TIME TAKING CARE OF THIS PATIENT: 40 minutes.   Ethlyn Daniels 07/26/2018, 12:51 AM  [facility]  Office  [phone]  CC: Primary care physician; Arnetha Courser, MD  Note:  This document was prepared using Dragon voice recognition software and may include unintentional dictation errors.

## 2018-07-26 NOTE — Telephone Encounter (Signed)
Advanced order form on your desk, but not sure if she is qualified?

## 2018-07-26 NOTE — ED Notes (Addendum)
.. ED TO INPATIENT HANDOFF REPORT  ED Nurse Name and Phone #: Deneise Lever [phone]  S Name/Age/Gender Amy [name] 83 y.o. female Room/Bed: ED06A/ED06A  Code Status   Code Status: Full Code  Home/SNF/Other Home Patient oriented to: self, place, time and situation (hx of dementia, pt alert and oriented x4 throughout visit) Is this baseline? Yes   Triage Complete: Triage complete  Chief Complaint AMS  Triage Note Pt from home via acems with c/o altered mental status. EMS states that when they arrived on scene, pt appeared to be post-ictal. EMS state spt appeared to be blank staring and not talking to them. EMS reports pt had 2-3 episodes of blank staring for them. No hx of seizures. blood sugar 275. Pt currently alert and responding to questions for this RN.   Allergies Allergies  Allergen Reactions  . Fentanyl Other (See Comments)    urine retention  . Penicillins Itching, Rash and Other (See Comments)    Has patient had a PCN reaction causing immediate rash, facial/tongue/throat swelling, SOB or lightheadedness with hypotension: Yes Has patient had a PCN reaction causing severe rash involving mucus membranes or skin necrosis: No Has patient had a PCN reaction that required hospitalization: No Has patient had a PCN reaction occurring within the last 10 years: Yes If all of the above answers are "NO", then may proceed with Cephalosporin use.    Level of Care/Admitting Diagnosis ED Disposition    ED Disposition Condition [facility] Area: [place] [100120]  Level of Care: Med-Surg [16]  Diagnosis: Hypoxia [809983]  Admitting Physician: Lance Coon [[id number]]  Attending Physician: Jannifer Franklin, DAVID [phone]  PT Class (Do Not Modify): Observation [104]  PT Acc Code (Do Not Modify): Observation [10022]       B Medical/Surgery History Past Medical History:  Diagnosis Date  . Anemia    low iron  . Anxiety   . Arthritis   .  Bilateral cataracts   . Bronchitis    hx of   . Cataract   . Chronic kidney disease   . Depression   . Diabetes mellitus without complication ([place])   . 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 GERD (gastroesophageal reflux disease)   . Headache   . History of frequent urinary tract infections   . Hx of renal cell cancer    LEFT  . Hyperlipidemia   . Hypertension   . Numbness and tingling    right hand   . Obesity   . Osteopenia 10/05/2017  . Peripheral artery disease ([place])   . Urinary frequency   . Urinary incontinence    Past Surgical History:  Procedure Laterality Date  . ABDOMINAL HYSTERECTOMY  1978  . CARPAL TUNNEL RELEASE Bilateral   . CHOLECYSTECTOMY    . COLONOSCOPY    . COLONOSCOPY WITH ESOPHAGOGASTRODUODENOSCOPY (EGD)    . ERCP N/A 08/19/2017   Procedure: ENDOSCOPIC RETROGRADE CHOLANGIOPANCREATOGRAPHY (ERCP);  Surgeon: Arta Silence, MD;  Location: [facility] ENDOSCOPY;  Service: Endoscopy;  Laterality: N/A;  . LEG AMPUTATION     above the knee / left   . ROBOT ASSISTED LAPAROSCOPIC NEPHRECTOMY Left 01/15/2015   Procedure: ROBOTIC ASSISTED LAPAROSCOPIC RADICAL NEPHRECTOMY;  Surgeon: Alexis Frock, MD;  Location: [facility] ORS;  Service: Urology;  Laterality: Left;  . stent placement right leg        A IV Location/Drains/Wounds Patient Lines/Drains/Airways Status   Active  Line/Drains/Airways    Name:   Placement date:   Placement time:   Site:   Days:   Peripheral IV 07/25/18 Right Forearm   07/25/18    2226    Forearm   1   GI Stent 5 Fr.   08/19/17    1225    -   341          Intake/Output Last 24 hours No intake or output data in the 24 hours ending 07/26/18 0331  Labs/Imaging Results for orders placed or performed during the hospital encounter of 07/25/18 (from the past 48 hour(s))  CBC with Differential/Platelet     Status: Abnormal   Collection Time: 07/25/18 10:19 PM  Result Value Ref Range   WBC 14.6 (H) 4.0 - 10.5  K/uL   RBC 4.06 3.87 - 5.11 MIL/uL   Hemoglobin 12.2 12.0 - 15.0 g/dL   HCT 37.4 36.0 - 46.0 %   MCV 92.1 80.0 - 100.0 fL   MCH 30.0 26.0 - 34.0 pg   MCHC 32.6 30.0 - 36.0 g/dL   RDW 16.3 (H) 11.5 - 15.5 %   Platelets 245 150 - 400 K/uL   nRBC 0.0 0.0 - 0.2 %   Neutrophils Relative % 73 %   Neutro Abs 10.6 (H) 1.7 - 7.7 K/uL   Lymphocytes Relative 17 %   Lymphs Abs 2.5 0.7 - 4.0 K/uL   Monocytes Relative 8 %   Monocytes Absolute 1.2 (H) 0.1 - 1.0 K/uL   Eosinophils Relative 1 %   Eosinophils Absolute 0.1 0.0 - 0.5 K/uL   Basophils Relative 0 %   Basophils Absolute 0.0 0.0 - 0.1 K/uL   Immature Granulocytes 1 %   Abs Immature Granulocytes 0.13 (H) 0.00 - 0.07 K/uL    Comment: Performed at [facility], [address] [place], [place] [address]  Comprehensive metabolic panel     Status: Abnormal   Collection Time: 07/25/18 10:19 PM  Result Value Ref Range   Sodium 139 135 - 145 mmol/L   Potassium 4.1 3.5 - 5.1 mmol/L   Chloride 103 98 - 111 mmol/L   CO2 25 22 - 32 mmol/L   Glucose, Bld 143 (H) 70 - 99 mg/dL   BUN 28 (H) 8 - 23 mg/dL   Creatinine, Ser 1.79 (H) 0.44 - 1.00 mg/dL   Calcium 8.2 (L) 8.9 - 10.3 mg/dL   Total Protein 6.2 (L) 6.5 - 8.1 g/dL   Albumin 2.9 (L) 3.5 - 5.0 g/dL   AST 34 15 - 41 U/L   ALT 37 0 - 44 U/L   Alkaline Phosphatase 73 38 - 126 U/L   Total Bilirubin 1.1 0.3 - 1.2 mg/dL   GFR calc non Af Amer 26 (L) >60 mL/min   GFR calc Af Amer 30 (L) >60 mL/min   Anion gap 11 5 - 15    Comment: Performed at [facility], [address] [place], [place] [address]   Ct Head Wo Contrast  Result Date: 07/25/2018 CLINICAL DATA:  New onset seizure. Altered mental status. EXAM: CT HEAD WITHOUT CONTRAST TECHNIQUE: Contiguous axial images were obtained from the base of the skull through the vertex without intravenous contrast. COMPARISON:  08/15/2014 head CT FINDINGS: Brain: There is no mass, hemorrhage or extra-axial collection. Hypodensity of  the white matter is most commonly associated with chronic microvascular disease. There are old bilateral basal ganglia lacunar infarcts. There is an old right frontal infarct and generalized age advanced  atrophy. Vascular: Atherosclerotic calcification of the vertebral and internal carotid arteries at the skull base. No abnormal hyperdensity of the major intracranial arteries or dural venous sinuses. Skull: The visualized skull base, calvarium and extracranial soft tissues are normal. Sinuses/Orbits: No fluid levels or advanced mucosal thickening of the visualized paranasal sinuses. No mastoid or middle ear effusion. The orbits are normal. IMPRESSION: 1. No acute intracranial abnormality. 2. Findings of chronic microvascular ischemia and age advanced atrophy with multiple old infarcts Electronically Signed   By: Ulyses Jarred M.D.   On: 07/25/2018 23:54   Dg Chest Portable 1 View  Result Date: 07/25/2018 CLINICAL DATA:  Altered mental status. Weakness. Clinical concern for pneumonia. EXAM: PORTABLE CHEST 1 VIEW COMPARISON:  Radiograph 07/20/2018, CT 07/17/2018 FINDINGS: Slight improved right basilar aeration from prior with residual atelectasis or scarring. Scarring in the left lower lobe. No acute airspace disease. Unchanged heart size and mediastinal contours. No pulmonary edema. Left upper lobe pulmonary nodule not well demonstrated radiographically. No pneumothorax. Remote right rib fracture. Bifid anterior right sixth rib. IMPRESSION: Improved right basilar opacity from prior exam with residual atelectasis or scarring. No new airspace disease. Electronically Signed   By: Keith Rake M.D.   On: 07/25/2018 21:45    Pending Labs Unresulted Labs (From admission, onward)    Start     Ordered   07/26/18 8413  Basic metabolic panel  Tomorrow morning,   STAT     07/26/18 0141   07/26/18 0500  CBC  Tomorrow morning,   STAT     07/26/18 0141          Vitals/Pain Today's Vitals   07/26/18 0000  07/26/18 0116 07/26/18 0127 07/26/18 0130  BP: (!) 100/55 (!) 97/59  (!) 113/95  Pulse: 65  68 69  Resp: 20 20 (!) 22 (!) 22  Temp:      TempSrc:      SpO2: 97% 100% 97% 98%  Weight:      Height:      PainSc:        Isolation Precautions No active isolations  Medications Medications  atorvastatin (LIPITOR) tablet 20 mg (has no administration in time range)  clopidogrel (PLAVIX) tablet 75 mg (has no administration in time range)  heparin injection 5,000 Units (has no administration in time range)  acetaminophen (TYLENOL) tablet 650 mg (has no administration in time range)    Or  acetaminophen (TYLENOL) suppository 650 mg (has no administration in time range)  insulin aspart (novoLOG) injection 0-9 Units (has no administration in time range)  meropenem (MERREM) 500 mg in sodium chloride 0.9 % 100 mL IVPB (has no administration in time range)  lidocaine (PF) (XYLOCAINE) 1 % injection 5 mL (5 mLs Intradermal Given 07/25/18 2218)    Mobility manual wheelchair High fall risk   Focused Assessments Neuro Assessment Handoff:  Swallow screen pass? Yes  Cardiac Rhythm: Normal sinus rhythm       Neuro Assessment: Within Defined Limits Neuro Checks:      Last Documented NIHSS Modified Score:   Has TPA been given? No If patient is a Neuro Trauma and patient is going to OR before floor call report to [place] nurse: [phone] or [phone]     R Recommendations: See Admitting Provider Note  Report given to: Beau Fanny, RN  Additional Notes:

## 2018-07-26 NOTE — Care Management Note (Signed)
Case Management Note  Patient Details  Name: Amy [name] MRN: [id number] Date of Birth: 12-11-1935  Subjective/Objective:  Admitted to [facility] with the diagnosis of hypoxia. Discharged from this facility 07/21/18.  Lives with husband,  Sees Dr. Sanda Klein as primary care physician. Followed by [facility] for physical therapy and social worker.  Acute oxygen                Action/Plan: Will continue for transition of care needs.   Expected Discharge Date:                  Expected Discharge Plan:     In-House Referral:   yes  Discharge planning Services     Post Acute Care Choice:    Choice offered to:     DME Arranged:    DME Agency:     HH Arranged:    HH Agency:     Status of Service:     If discussed at [employer], dates discussed:    Additional Comments:  Shelbie Ammons, RN MSN CCM Care Management [phone] 07/26/2018, 3:29 PM

## 2018-07-27 ENCOUNTER — Ambulatory Visit: Payer: Medicare HMO | Admitting: Internal Medicine

## 2018-07-27 ENCOUNTER — Inpatient Hospital Stay: Payer: Self-pay | Admitting: Family Medicine

## 2018-07-27 DIAGNOSIS — R0902 Hypoxemia: Secondary | ICD-10-CM | POA: Diagnosis not present

## 2018-07-27 DIAGNOSIS — Z7189 Other specified counseling: Secondary | ICD-10-CM | POA: Diagnosis not present

## 2018-07-27 DIAGNOSIS — N390 Urinary tract infection, site not specified: Secondary | ICD-10-CM | POA: Diagnosis not present

## 2018-07-27 DIAGNOSIS — Z515 Encounter for palliative care: Secondary | ICD-10-CM

## 2018-07-27 DIAGNOSIS — J449 Chronic obstructive pulmonary disease, unspecified: Secondary | ICD-10-CM | POA: Diagnosis not present

## 2018-07-27 DIAGNOSIS — E119 Type 2 diabetes mellitus without complications: Secondary | ICD-10-CM | POA: Diagnosis not present

## 2018-07-27 LAB — GLUCOSE, CAPILLARY
Glucose-Capillary: 109 mg/dL — ABNORMAL HIGH (ref 70–99)
Glucose-Capillary: 88 mg/dL (ref 70–99)
Glucose-Capillary: 88 mg/dL (ref 70–99)

## 2018-07-27 MED ORDER — GABAPENTIN 600 MG PO TABS: 300.0000 mg | ORAL_TABLET | Freq: Three times a day (TID) | ORAL | Status: DC

## 2018-07-27 MED ORDER — QUETIAPINE FUMARATE 25 MG PO TABS: 25.0000 mg | ORAL_TABLET | ORAL | Status: DC

## 2018-07-27 MED ORDER — SULFAMETHOXAZOLE-TRIMETHOPRIM 400-80 MG PO TABS: 1.0000 | ORAL_TABLET | Freq: Two times a day (BID) | ORAL | 0 refills | Status: AC

## 2018-07-27 MED ORDER — QUETIAPINE FUMARATE 25 MG PO TABS: 25.0000 mg | ORAL_TABLET | ORAL | 0 refills | Status: DC

## 2018-07-27 MED ORDER — QUETIAPINE FUMARATE 25 MG PO TABS
50.0000 mg | ORAL_TABLET | Freq: Once | ORAL | Status: AC
  Administered 2018-07-27: 50 mg via ORAL
  Filled 2018-07-27: qty 2

## 2018-07-27 NOTE — Discharge Summary (Signed)
[facility] NAME: Amy [name]    MR#:  [id number]  DATE OF BIRTH:  Oct 04, 1935  DATE OF ADMISSION:  07/25/2018 ADMITTING PHYSICIAN: Lance Coon, MD  DATE OF DISCHARGE: 07/27/2018   PRIMARY CARE PHYSICIAN: Arnetha Courser, MD    ADMISSION DIAGNOSIS:  Acute respiratory failure with hypoxia ([place]) [J96.01]  DISCHARGE DIAGNOSIS:  Principal Problem:   Hypoxia Active Problems:   Diabetes mellitus type 2, controlled (HCC)   GERD (gastroesophageal reflux disease)   GAD (generalized anxiety disorder)   CKD (chronic kidney disease) stage 4, GFR 15-29 ml/min (HCC)   COPD (chronic obstructive pulmonary disease) (HCC)   Hyperlipidemia   Goals of care, counseling/discussion   Palliative care by specialist   SECONDARY DIAGNOSIS:   Past Medical History:  Diagnosis Date  . Anemia    low iron  . Anxiety   . Arthritis   . Bilateral cataracts   . Bronchitis    hx of   . Cataract   . Chronic kidney disease   . Depression   . Diabetes mellitus without complication ([place])   . 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 GERD (gastroesophageal reflux disease)   . Headache   . History of frequent urinary tract infections   . Hx of renal cell cancer    LEFT  . Hyperlipidemia   . Hypertension   . Numbness and tingling    right hand   . Obesity   . Osteopenia 10/05/2017  . Peripheral artery disease ([place])   . Urinary frequency   . Urinary incontinence     HOSPITAL COURSE:   *Hypoxia -suspect this is due to potentially some intermittent bronchospasm in the setting of recent pneumonia.  Try to taper supplemental oxygen to room air and check for qualification of receiving oxygen at home if possible.  She had significant drop in Oxygen on transfer- will need home oxygen.  *ESBL E. coli UTI-  IV meropenem while here in the hospital, this can be switched to p.o. antibiotic on  discharge  Give bactrim on discharge.  *Diabetes mellitus type 2, controlled (HCC)-sliding scale insulin coverage *COPD (chronic obstructive pulmonary disease) ([place]) -continue home meds, Inhalers.   Will need oxygen now. *GERD (gastroesophageal reflux disease) -home dose PPI *GAD (generalized anxiety disorder) -home dose anxiolytic *CKD (chronic kidney disease) stage 4, GFR 15-29 ml/min (HCC) -at baseline, avoid nephrotoxins and monitor *Hyperlipidemia -home dose antilipid *Hallucination and behavioral issues-she was started on Seroquel in last admission by psych and as per Dr. Weber Cooks note, try to taper when possible. We will cut down the dose in this admission while in hospital.   DISCHARGE CONDITIONS:   Stable.  CONSULTS OBTAINED:    DRUG ALLERGIES:   Allergies  Allergen Reactions  . Fentanyl Other (See Comments)    urine retention  . Penicillins Itching, Rash and Other (See Comments)    Has patient had a PCN reaction causing immediate rash, facial/tongue/throat swelling, SOB or lightheadedness with hypotension: Yes Has patient had a PCN reaction causing severe rash involving mucus membranes or skin necrosis: No Has patient had a PCN reaction that required hospitalization: No Has patient had a PCN reaction occurring within the last 10 years: Yes If all of the above answers are "NO", then may proceed with Cephalosporin use.    DISCHARGE MEDICATIONS:   Allergies as of 07/27/2018  Reactions   Fentanyl Other (See Comments)   urine retention   Penicillins Itching, Rash, Other (See Comments)   Has patient had a PCN reaction causing immediate rash, facial/tongue/throat swelling, SOB or lightheadedness with hypotension: Yes Has patient had a PCN reaction causing severe rash involving mucus membranes or skin necrosis: No Has patient had a PCN reaction that required hospitalization: No Has patient had a PCN reaction occurring within the last 10 years: Yes If  all of the above answers are "NO", then may proceed with Cephalosporin use.      Medication List    STOP taking these medications   cefdinir 300 MG capsule Commonly known as:  OMNICEF   furosemide 20 MG tablet Commonly known as:  LASIX   metoprolol tartrate 50 MG tablet Commonly known as:  LOPRESSOR   predniSONE 10 MG tablet Commonly known as:  DELTASONE     TAKE these medications   acetaminophen 325 MG tablet Commonly known as:  TYLENOL Take 2 tablets (650 mg total) by mouth every 6 (six) hours as needed for mild pain (or Fever >/= 101).   albuterol 108 (90 Base) MCG/ACT inhaler Commonly known as:  PROVENTIL HFA;VENTOLIN HFA Inhale 2 puffs into the lungs every 6 (six) hours as needed for wheezing or shortness of breath.   atorvastatin 20 MG tablet Commonly known as:  LIPITOR TAKE 1 TABLET (20 MG TOTAL) BY MOUTH AT BEDTIME.   clopidogrel 75 MG tablet Commonly known as:  PLAVIX Take 1 tablet (75 mg total) by mouth daily.   donepezil 10 MG tablet Commonly known as:  ARICEPT Take 1 tablet (10 mg total) by mouth at bedtime.   famotidine 20 MG tablet Commonly known as:  PEPCID Take 1 tablet (20 mg total) by mouth at bedtime. To help prevent heartburn, reflux   FLUoxetine 10 MG capsule Commonly known as:  PROZAC Take 1 capsule (10 mg total) by mouth daily.   gabapentin 300 MG capsule Commonly known as:  NEURONTIN Take 1 capsule (300 mg total) by mouth 3 (three) times daily.   HYDROcodone-acetaminophen 7.5-325 MG tablet Commonly known as:  NORCO Take 1 tablet by mouth every 8 (eight) hours as needed for up to 30 days. For chronic pain.  To fill on or after: 06-21-2018, 07-21-2018, 08-20-2018   ipratropium-albuterol 0.5-2.5 (3) MG/3ML Soln Commonly known as:  DUONEB INHALE THE CONTENTS OF 1 VIAL VIA NEBULIZER THREE TIMES DAILY AS DIRECTED What changed:  See the new instructions.   QUEtiapine 25 MG tablet Commonly known as:  SEROQUEL Take 1 tablet (25 mg total) by  mouth See admin instructions. Take 1 tablet in a.m. and 1 tablet in the afternoon and 2 tablets(50 mg total) before bedtime around 8 to 9 PM What changed:  additional instructions   sulfamethoxazole-trimethoprim 400-80 MG tablet Commonly known as:  BACTRIM Take 1 tablet by mouth 2 (two) times daily for 7 days.   Zinc Oxide 13 % Crea Commonly known as:  DESITIN RAPID RELIEF Apply 1 application topically 3 (three) times daily as needed.            Durable Medical Equipment  (From admission, onward)         Start     Ordered   07/27/18 1352  For home use only DME oxygen  Once    Comments:  COPD  Question Answer Comment  Mode or (Route) Nasal cannula   Liters per Minute 2   Frequency Continuous (stationary and portable oxygen unit needed)  Oxygen conserving device Yes   Oxygen delivery system Gas      07/27/18 1351           DISCHARGE INSTRUCTIONS:    Palliative care to folow after discharge.  If you experience worsening of your admission symptoms, develop shortness of breath, life threatening emergency, suicidal or homicidal thoughts you must seek medical attention immediately by calling 911 or calling your MD immediately  if symptoms less severe.  You Must read complete instructions/literature along with all the possible adverse reactions/side effects for all the Medicines you take and that have been prescribed to you. Take any new Medicines after you have completely understood and accept all the possible adverse reactions/side effects.   Please note  You were cared for by a hospitalist during your hospital stay. If you have any questions about your discharge medications or the care you received while you were in the hospital after you are discharged, you can call the unit and asked to speak with the hospitalist on call if the hospitalist that took care of you is not available. Once you are discharged, your primary care physician will handle any further medical issues.  Please note that NO REFILLS for any discharge medications will be authorized once you are discharged, as it is imperative that you return to your primary care physician (or establish a relationship with a primary care physician if you do not have one) for your aftercare needs so that they can reassess your need for medications and monitor your lab values.    Today   CHIEF COMPLAINT:   Chief Complaint  Patient presents with  . Altered Mental Status    HISTORY OF PRESENT ILLNESS:  Amy [name]  is a 83 y.o. female presents with chief complaint as above.  Presents the ED after an episode of decreased responsiveness and hypoxia.  She was recently admitted here and treated for pneumonia.  She was discharged back home.  She and her family at bedside states that she was diagnosed with UTI, given some other antibiotic, they are not sure which one.  Work-up here in the ED is largely within normal limits at this time.  Chart review shows that her UTI was from ESBL E. coli.  Hospitalist were called for admission   VITAL SIGNS:  Blood pressure (!) 101/58, pulse 71, temperature 98.1 F (36.7 C), resp. rate 16, height 5\' 2"  (1.575 m), weight 71.7 kg, SpO2 91 %.  I/O:    Intake/Output Summary (Last 24 hours) at 07/27/2018 1354 Last data filed at 07/27/2018 0518 Gross per 24 hour  Intake 147.91 ml  Output 1300 ml  Net -1152.09 ml    PHYSICAL EXAMINATION:   GENERAL:  83 y.o.-year-old patient lying in the bed with no acute distress.  EYES: Pupils equal, round, reactive to light and accommodation. No scleral icterus. Extraocular muscles intact.  HEENT: Head atraumatic, normocephalic. Oropharynx and nasopharynx clear.  NECK:  Supple, no jugular venous distention. No thyroid enlargement, no tenderness.  LUNGS: Normal breath sounds bilaterally, no wheezing, rales,rhonchi or crepitation. No use of accessory muscles of respiration.  CARDIOVASCULAR: S1, S2 normal. No murmurs, rubs, or gallops.   ABDOMEN: Soft, nontender, nondistended. Bowel sounds present. No organomegaly or mass.  EXTREMITIES: No pedal edema, cyanosis, or clubbing.  Status post amputation NEUROLOGIC: Cranial nerves II through XII are intact. Muscle strength 4/5 in all extremities. Sensation intact. Gait not checked.  PSYCHIATRIC: The patient is alert and oriented x 2.  SKIN: No obvious rash, lesion,  or ulcer.   DATA REVIEW:   CBC Recent Labs  Lab 07/26/18 0531  WBC 13.2*  HGB 12.1  HCT 37.8  PLT 226    Chemistries  Recent Labs  Lab 07/25/18 2219 07/26/18 0531  NA 139 140  K 4.1 4.1  CL 103 103  CO2 25 26  GLUCOSE 143* 97  BUN 28* 28*  CREATININE 1.79* 1.87*  CALCIUM 8.2* 8.2*  AST 34  --   ALT 37  --   ALKPHOS 73  --   BILITOT 1.1  --     Cardiac Enzymes No results for input(s): TROPONINI in the last 168 hours.  Microbiology Results  Results for orders placed or performed during the hospital encounter of 07/17/18  Blood culture (routine x 2)     Status: None   Collection Time: 07/17/18  9:40 PM  Result Value Ref Range Status   Specimen Description BLOOD RIGHT ASSIST CONTROL  Final   Special Requests   Final    BOTTLES DRAWN AEROBIC AND ANAEROBIC Blood Culture results may not be optimal due to an excessive volume of blood received in culture bottles   Culture   Final    NO GROWTH 5 DAYS Performed at [facility], [address] [place], [place] [address]    Report Status 07/22/2018 FINAL  Final  Blood culture (routine x 2)     Status: None   Collection Time: 07/17/18  9:40 PM  Result Value Ref Range Status   Specimen Description BLOOD LEFT ASSIST CONTROL  Final   Special Requests   Final    BOTTLES DRAWN AEROBIC AND ANAEROBIC Blood Culture results may not be optimal due to an excessive volume of blood received in culture bottles   Culture   Final    NO GROWTH 5 DAYS Performed at [facility], [address] [place], [place] [address]    Report Status  07/22/2018 FINAL  Final  MRSA PCR Screening     Status: None   Collection Time: 07/19/18 12:10 PM  Result Value Ref Range Status   MRSA by PCR NEGATIVE NEGATIVE Final    Comment:        The GeneXpert MRSA Assay (FDA approved for NASAL specimens only), is one component of a comprehensive MRSA colonization surveillance program. It is not intended to diagnose MRSA infection nor to guide or monitor treatment for MRSA infections. Performed at [facility], [address], [place], [place] [address]   Urine Culture     Status: Abnormal   Collection Time: 07/21/18  5:58 PM  Result Value Ref Range Status   Specimen Description   Final    URINE, CATHETERIZED Performed at [facility], [address], [place], [place] [address]    Special Requests   Final    NONE Performed at [facility], [address] [place], [place] [address]    Culture (A)  Final    20,000 COLONIES/mL ESCHERICHIA COLI Confirmed Extended Spectrum Beta-Lactamase Producer (ESBL).  In bloodstream infections from ESBL organisms, carbapenems are preferred over piperacillin/tazobactam. They are shown to have a lower risk of mortality. 20,000 COLONIES/mL YEAST    Report Status 07/24/2018 FINAL  Final   Organism ID, Bacteria ESCHERICHIA COLI (A)  Final      Susceptibility   Escherichia coli - MIC*    AMPICILLIN >=32 RESISTANT Resistant     CEFAZOLIN >=64 RESISTANT Resistant     CEFTRIAXONE >=64 RESISTANT Resistant  CIPROFLOXACIN <=0.25 SENSITIVE Sensitive     GENTAMICIN <=1 SENSITIVE Sensitive     IMIPENEM <=0.25 SENSITIVE Sensitive     NITROFURANTOIN <=16 SENSITIVE Sensitive     TRIMETH/SULFA <=20 SENSITIVE Sensitive     AMPICILLIN/SULBACTAM 4 SENSITIVE Sensitive     PIP/TAZO <=4 SENSITIVE Sensitive     Extended ESBL POSITIVE Resistant     * 20,000 COLONIES/mL ESCHERICHIA COLI    RADIOLOGY:  Ct Head Wo Contrast  Result Date: 07/25/2018 CLINICAL DATA:  New onset seizure.  Altered mental status. EXAM: CT HEAD WITHOUT CONTRAST TECHNIQUE: Contiguous axial images were obtained from the base of the skull through the vertex without intravenous contrast. COMPARISON:  08/15/2014 head CT FINDINGS: Brain: There is no mass, hemorrhage or extra-axial collection. Hypodensity of the white matter is most commonly associated with chronic microvascular disease. There are old bilateral basal ganglia lacunar infarcts. There is an old right frontal infarct and generalized age advanced atrophy. Vascular: Atherosclerotic calcification of the vertebral and internal carotid arteries at the skull base. No abnormal hyperdensity of the major intracranial arteries or dural venous sinuses. Skull: The visualized skull base, calvarium and extracranial soft tissues are normal. Sinuses/Orbits: No fluid levels or advanced mucosal thickening of the visualized paranasal sinuses. No mastoid or middle ear effusion. The orbits are normal. IMPRESSION: 1. No acute intracranial abnormality. 2. Findings of chronic microvascular ischemia and age advanced atrophy with multiple old infarcts Electronically Signed   By: Ulyses Jarred M.D.   On: 07/25/2018 23:54   Dg Chest Portable 1 View  Result Date: 07/25/2018 CLINICAL DATA:  Altered mental status. Weakness. Clinical concern for pneumonia. EXAM: PORTABLE CHEST 1 VIEW COMPARISON:  Radiograph 07/20/2018, CT 07/17/2018 FINDINGS: Slight improved right basilar aeration from prior with residual atelectasis or scarring. Scarring in the left lower lobe. No acute airspace disease. Unchanged heart size and mediastinal contours. No pulmonary edema. Left upper lobe pulmonary nodule not well demonstrated radiographically. No pneumothorax. Remote right rib fracture. Bifid anterior right sixth rib. IMPRESSION: Improved right basilar opacity from prior exam with residual atelectasis or scarring. No new airspace disease. Electronically Signed   By: Keith Rake M.D.   On: 07/25/2018 21:45     EKG:   Orders placed or performed during the hospital encounter of 07/25/18  . ED EKG  . ED EKG      Management plans discussed with the patient, family and they are in agreement.  CODE STATUS:     Code Status Orders  (From admission, onward)         Start     Ordered   07/27/18 1204  Do not attempt resuscitation (DNR)  Continuous    Question Answer Comment  In the event of cardiac or respiratory ARREST Do not call a "code blue"   In the event of cardiac or respiratory ARREST Do not perform Intubation, CPR, defibrillation or ACLS   In the event of cardiac or respiratory ARREST Use medication by any route, position, wound care, and other measures to relive pain and suffering. May use oxygen, suction and manual treatment of airway obstruction as needed for comfort.      07/27/18 1203        Code Status History    Date Active Date Inactive Code Status Order ID Comments User Context   07/26/2018 0141 07/27/2018 1203 Full Code [id number]  Lance Coon, MD ED   07/18/2018 0441 07/21/2018 2325 Full Code [id number]  Lance Coon, MD ED   07/02/2018 1224 07/03/2018 1605  Full Code [id number]  Demetrios Loll, MD Inpatient   08/13/2017 0332 08/15/2017 1824 Full Code [id number]  Lance Coon, MD Inpatient   01/15/2015 2127 01/17/2015 1541 Full Code [id number]  Star Age, MD Inpatient    Advance Directive Documentation     Most Recent Value  Type of Advance Directive  Living will, Healthcare Power of Attorney  Pre-existing out of facility DNR order (yellow form or pink MOST form)  -  "MOST" Form in Place?  -      TOTAL TIME TAKING CARE OF THIS PATIENT: 35 minutes.    Vaughan Basta M.D on 07/27/2018 at 1:54 PM  Between 7am to 6pm - Pager - [phone]  After 6pm go to [email or web address] - password [email or web address] [place] [facility]  Office  [phone]  CC: Primary care physician; Arnetha Courser, MD   Note: This dictation was prepared with Dragon dictation along with  smaller phrase technology. Any transcriptional errors that result from this process are unintentional.

## 2018-07-27 NOTE — Progress Notes (Signed)
SATURATION QUALIFICATIONS: (This note is used to comply with regulatory documentation for home oxygen)  Patient Saturations on Room Air at Rest = 89%  Patient Saturations on Room Air while Ambulating = 86%  Patient Saturations on 2 Liters of oxygen while Ambulating = 95%  Please briefly explain why patient needs home oxygen:

## 2018-07-27 NOTE — Progress Notes (Signed)
[facility] previously delivered home oxygen as ordered; pt discharged via wheelchair by nursing to the visitor's entrance

## 2018-07-27 NOTE — Progress Notes (Signed)
Pt discharge home with home oxygen order received in [facility] for today; Care Management previously established home oxygen with [place] [facility]; verbally reviewed AVS with pt and pt's spouse, no questions voiced at this time; out of facility DNR in discharge envelope with Rxs; no questions voiced at this time; pt's discharge pending arrival of [place] for home oxygen delivery

## 2018-07-27 NOTE — Progress Notes (Addendum)
Pt anxiety growing with appearance of increased agitation. Noted increased fidgiting. Pt beginning to call out repeatedly. Contacted on call MD, Jannifer Franklin. MD to order Seroquel as prescribed at home. Medication given pt is currently resting comfortably as of 0438.

## 2018-07-27 NOTE — Consult Note (Signed)
Consultation Note Date: 07/27/2018   Patient Name: Amy [name]  DOB: 09/05/1935  MRN: [id number]  Age / Sex: 83 y.o., female  PCP: Arnetha Courser, MD Referring Physician: Vaughan Basta, *  Reason for Consultation: Establishing goals of care  HPI/Patient Profile: 83 y.o. female  with past medical history of T2DM, L AKA, GERDm anxiety, CKD 4, COPD, and HLD admitted on 07/25/2018 with AMS and hypoxia. Patient recent discharged from hospital with pna. She has been admitted 3 times in the past 2 weeks d/t respiratory issues. Also found to have ESBL UTI. PMT consulted for [place].  Clinical Assessment and Goals of Care: I have reviewed medical records including EPIC notes, labs and imaging, assessed the patient and then met with patient and spouse to discuss diagnosis prognosis, GOC, EOL wishes, disposition and options.  I introduced Palliative Medicine as specialized medical care for people living with serious illness. It focuses on providing relief from the symptoms and stress of a serious illness. The goal is to improve quality of life for both the patient and the family.  We discussed a brief life review of the patient. They tell me that patient lives at home with her spouse and they have caregivers there most of the day. Spouse tells me of how difficult things have been recently as he has also had multiple health problems. He tells me his concerns about patient's behavior/anxiety. He tells me he felt that she acted "better" on higher doses of seroquel but he was also concerned this was adversely affecting her breathing. We discussed that her seroquel has been decreased from home dose and we can monitor her mood and breathing.   As far as functional and nutritional status, he tells me she is dependent in ADLs - her caregivers bathe her and help her pivot from bed to wheelchair. She spends most of her time in a recliner chair. No concerns about  appetite.    We discussed her current illness and what it means in the larger context of her on-going co-morbidities.  Natural disease trajectory and expectations at EOL were discussed. We discussed her COPD and recurrent respiratory issues. We discussed her poor functional status.  The difference between aggressive medical intervention and comfort care was considered in light of the patient's goals of care.   Advance directives, concepts specific to code status, artifical feeding and hydration, and rehospitalization were considered and discussed. We discussed code status - explained differences between DNR and FULL code - explained that DNR did not mean "do not treat". Patient and husband both request that her code status is changed to DNR.  Hospice and Palliative Care services outpatient were explained and offered. Ex plained that patient may benefit from continued follow up palliative care at home - patient and family agree. Will consult case management.   Questions and concerns were addressed. The family was encouraged to call with questions or concerns.   Primary Decision Maker PATIENT joined by spouse   SUMMARY OF RECOMMENDATIONS   - Code status changed to DNR - signed DNR placed on chart - case management consulted for OP palliative to follow at home - Family and patient with multiple questions about seroquel dose and restarting neurontin - will discuss with Dr. Anselm Jungling  Code Status/Advance Care Planning:  DNR  Palliative Prophylaxis:   Delirium Protocol, Frequent Pain Assessment and Turn Reposition  Additional Recommendations (Limitations, Scope, Preferences):  Full Scope Treatment and No Tracheostomy  Psycho-social/Spiritual:   Desire for further Chaplaincy support:no  Additional Recommendations: Education on Hospice  Prognosis:   Unable to determine  Discharge Planning: Home with Palliative Services      Primary Diagnoses: Present on Admission: . GAD  (generalized anxiety disorder) . CKD (chronic kidney disease) stage 4, GFR 15-29 ml/min (HCC) . COPD (chronic obstructive pulmonary disease) ([place]) . GERD (gastroesophageal reflux disease) . Hyperlipidemia . Hypoxia   I have reviewed the medical record, interviewed the patient and family, and examined the patient. The following aspects are pertinent.  Past Medical History:  Diagnosis Date  . Anemia    low iron  . Anxiety   . Arthritis   . Bilateral cataracts   . Bronchitis    hx of   . Cataract   . Chronic kidney disease   . Depression   . Diabetes mellitus without complication ([place])   . 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 GERD (gastroesophageal reflux disease)   . Headache   . History of frequent urinary tract infections   . Hx of renal cell cancer    LEFT  . Hyperlipidemia   . Hypertension   . Numbness and tingling    right hand   . Obesity   . Osteopenia 10/05/2017  . Peripheral artery disease ([place])   . Urinary frequency   . Urinary incontinence    Social History   Socioeconomic History  . Marital status: Married    Spouse name: [name]  . Number of children: 1  . Years of education: Not on file  . Highest education level: 10th grade  Occupational History  . Occupation: Retired  Scientific laboratory technician  . Financial resource strain: Not hard at all  . Food insecurity:    Worry: Never true    Inability: Never true  . Transportation needs:    Medical: No    Non-medical: No  Tobacco Use  . Smoking status: Former Smoker    Packs/day: 1.50    Years: 51.00    Pack years: 76.50    Types: Cigarettes    Last attempt to quit: 05/24/2004    Years since quitting: 14.1  . Smokeless tobacco: Never Used  . Tobacco comment: smoking cessation materials not required  Substance and Sexual Activity  . Alcohol use: Not Currently  . Drug use: No  . Sexual activity: Not Currently  Lifestyle  . Physical activity:    Days per week: 0  days    Minutes per session: 0 min  . Stress: Very much  Relationships  . Social connections:    Talks on phone: Patient refused    Gets together: Patient refused    Attends religious service: Patient refused    Active member of club or organization: Patient refused    Attends meetings of clubs or organizations: Patient refused    Relationship status: Married  Other Topics Concern  . Not on file  Social History Narrative  . Not on file   Family History  Problem Relation Age of Onset  . Cancer Mother        Stomach  . Cancer Father   . Diabetes Brother   . Cancer Brother        oral  . Healthy Son   . Healthy Sister    Scheduled Meds: . atorvastatin  20 mg Oral QHS  . clopidogrel  75 mg Oral Daily  . FLUoxetine  10 mg Oral Daily  . heparin  5,000 Units Subcutaneous  Q8H  . insulin aspart  0-9 Units Subcutaneous TID WC  . QUEtiapine  25 mg Oral See admin instructions   Continuous Infusions: . meropenem (MERREM) IV 500 mg (07/27/18 0351)   PRN Meds:.acetaminophen **OR** acetaminophen, ipratropium-albuterol Allergies  Allergen Reactions  . Fentanyl Other (See Comments)    urine retention  . Penicillins Itching, Rash and Other (See Comments)    Has patient had a PCN reaction causing immediate rash, facial/tongue/throat swelling, SOB or lightheadedness with hypotension: Yes Has patient had a PCN reaction causing severe rash involving mucus membranes or skin necrosis: No Has patient had a PCN reaction that required hospitalization: No Has patient had a PCN reaction occurring within the last 10 years: Yes If all of the above answers are "NO", then may proceed with Cephalosporin use.   Review of Systems  Musculoskeletal:       Pain in left lower extremity  All other systems reviewed and are negative.   Physical Exam Vitals signs and nursing note reviewed.  Constitutional:      General: She is not in acute distress. HENT:     Head: Normocephalic and atraumatic.    Cardiovascular:     Rate and Rhythm: Normal rate and regular rhythm.     Pulses: Normal pulses.     Heart sounds: Normal heart sounds.  Pulmonary:     Effort: Pulmonary effort is normal.     Breath sounds: Normal breath sounds.  Abdominal:     Palpations: Abdomen is soft.  Musculoskeletal:     Comments: L AKA  Skin:    General: Skin is warm and dry.  Neurological:     Mental Status: She is alert and oriented to person, place, and time.  Psychiatric:        Mood and Affect: Mood is anxious.        Speech: Speech is rapid and pressured.        Judgment: Judgment is impulsive.     Vital Signs: BP (!) 101/58 (BP Location: Left Arm)   Pulse 71   Temp 98.1 F (36.7 C)   Resp 16   Ht 5' 2"  (1.575 m)   Wt 71.7 kg   SpO2 91%   BMI 28.90 kg/m  Pain Scale: PAINAD   Pain Score: 0-No pain   SpO2: SpO2: 91 % O2 Device:SpO2: 91 % O2 Flow Rate: .O2 Flow Rate (L/min): 2 L/min  IO: Intake/output summary:   Intake/Output Summary (Last 24 hours) at 07/27/2018 1220 Last data filed at 07/27/2018 0518 Gross per 24 hour  Intake 147.91 ml  Output 1300 ml  Net -1152.09 ml    LBM: Last BM Date: 07/26/18 Baseline Weight: Weight: 71.7 kg Most recent weight: Weight: 71.7 kg     Palliative Assessment/Data: PPS 30%    Time Total: 70 minutes Greater than 50%  of this time was spent counseling and coordinating care related to the above assessment and plan.  Juel Burrow, DNP, AGNP-C Palliative Medicine Team [phone] Pager: [phone]

## 2018-07-27 NOTE — Care Management (Signed)
Qualifies for home oxygen. Amy [name], [place] representative updated. Discharge to home today per  Dr. Devoria Glassing RN MSN CCM Care Management [phone]

## 2018-07-27 NOTE — Progress Notes (Signed)
New referral for outpatient Palliative to follow at home received following a  Palliative Medicine consult. Patient to discharge today, will be followed by [facility]. Patient information faxed to referral. Flo Shanks BSN, RN, [facility] Liaison [facility] [place] [phone]

## 2018-07-27 NOTE — Discharge Instructions (Signed)
[facility]

## 2018-07-28 ENCOUNTER — Telehealth: Payer: Self-pay | Admitting: Family Medicine

## 2018-07-28 ENCOUNTER — Telehealth: Payer: Self-pay

## 2018-07-28 DIAGNOSIS — J029 Acute pharyngitis, unspecified: Secondary | ICD-10-CM | POA: Diagnosis not present

## 2018-07-28 DIAGNOSIS — J441 Chronic obstructive pulmonary disease with (acute) exacerbation: Secondary | ICD-10-CM | POA: Diagnosis not present

## 2018-07-28 DIAGNOSIS — E1122 Type 2 diabetes mellitus with diabetic chronic kidney disease: Secondary | ICD-10-CM | POA: Diagnosis not present

## 2018-07-28 DIAGNOSIS — F0390 Unspecified dementia without behavioral disturbance: Secondary | ICD-10-CM | POA: Diagnosis not present

## 2018-07-28 DIAGNOSIS — N183 Chronic kidney disease, stage 3 (moderate): Secondary | ICD-10-CM | POA: Diagnosis not present

## 2018-07-28 DIAGNOSIS — I129 Hypertensive chronic kidney disease with stage 1 through stage 4 chronic kidney disease, or unspecified chronic kidney disease: Secondary | ICD-10-CM | POA: Diagnosis not present

## 2018-07-28 DIAGNOSIS — C642 Malignant neoplasm of left kidney, except renal pelvis: Secondary | ICD-10-CM | POA: Diagnosis not present

## 2018-07-28 DIAGNOSIS — J181 Lobar pneumonia, unspecified organism: Secondary | ICD-10-CM | POA: Diagnosis not present

## 2018-07-28 DIAGNOSIS — J440 Chronic obstructive pulmonary disease with acute lower respiratory infection: Secondary | ICD-10-CM | POA: Diagnosis not present

## 2018-07-28 NOTE — Telephone Encounter (Signed)
Copied from [place] [id number]. Topic: Quick Communication - See Telephone Encounter >> Jul 24, 2018  3:45 PM Robina Ade, Helene Kelp D wrote: CRM for notification. See Telephone encounter for: 07/24/18. Ashleigh with [facility] call and would like new order for patient as follow: verbal order for skill nurse visit for 1 week 4 and 2 PRN. She can be reached at [phone] >> Jul 28, 2018  9:22 AM Carolyn Stare wrote:   Asley call back to say she wanted change her req for skill nursing to  1 x 2     [phone]

## 2018-07-28 NOTE — Telephone Encounter (Signed)
That's fine, thank you  

## 2018-07-28 NOTE — Telephone Encounter (Signed)
Yes that is fine

## 2018-07-28 NOTE — Telephone Encounter (Signed)
Verbal orders given to [facility]

## 2018-07-28 NOTE — Telephone Encounter (Signed)
Called to PT Amy [name] back to asked if anyone assessed he was not for sure.  So Called patient husband he did say nurse came out she woke up and everything checked out normal.  We have not received anything as of yet from nurse.  Husband told if get worse to go to ER

## 2018-07-28 NOTE — Telephone Encounter (Signed)
Copied from [place] [id number]. Topic: Quick Communication - See Telephone Encounter >> Jul 28, 2018  2:00 PM Vernona Rieger wrote: CRM for notification. See Telephone encounter for: 07/28/18.  Tommy, physical therapist assistant with [facility] said he went to see her today but was unable to really get her to do much because she was lethargic. She would not open her eyes like she was awake. He said her vitals were good and he contacted his boss and they are going to send nursing out there as well. He can be reached at [phone] if you need to talk to him

## 2018-07-28 NOTE — Telephone Encounter (Signed)
Copied from [place] [phone]. Topic: Referral - Status >> Jul 28, 2018  3:08 PM Simone Curia D wrote: 05/29/8385 Called Mr. Mckellar to follow up with him about Medicare Transportation. He was able to contact them and arrange transportation for [employer].MA

## 2018-07-28 NOTE — Telephone Encounter (Signed)
Copied from [place] [phone]. Topic: Quick Communication - See Telephone Encounter >> Jul 28, 2018  1:22 PM Reyne Dumas L wrote: CRM for notification. See Telephone encounter for: 07/28/18.  Stacy from [employer].  States pt just discharged from [facility] and they have requested Palliative care follow pt at home.  Stacy calling to make sure that Dr. Sanda Klein will be the signing doctor for pt. Marzetta Board can be reached at [phone]

## 2018-07-28 NOTE — Telephone Encounter (Signed)
Please have home health get out there and assess her immediately; if that is not an option and she is not responsive and husband wants her cared for (I don't think she is a Hospice comfort care only patient), all I can recommend is for her to go back to the hospital to get checked out

## 2018-07-31 ENCOUNTER — Telehealth: Payer: Self-pay

## 2018-07-31 ENCOUNTER — Telehealth: Payer: Self-pay | Admitting: Family Medicine

## 2018-07-31 DIAGNOSIS — I129 Hypertensive chronic kidney disease with stage 1 through stage 4 chronic kidney disease, or unspecified chronic kidney disease: Secondary | ICD-10-CM | POA: Diagnosis not present

## 2018-07-31 DIAGNOSIS — J440 Chronic obstructive pulmonary disease with acute lower respiratory infection: Secondary | ICD-10-CM | POA: Diagnosis not present

## 2018-07-31 DIAGNOSIS — J449 Chronic obstructive pulmonary disease, unspecified: Secondary | ICD-10-CM | POA: Diagnosis not present

## 2018-07-31 DIAGNOSIS — E1122 Type 2 diabetes mellitus with diabetic chronic kidney disease: Secondary | ICD-10-CM | POA: Diagnosis not present

## 2018-07-31 DIAGNOSIS — R197 Diarrhea, unspecified: Secondary | ICD-10-CM

## 2018-07-31 DIAGNOSIS — C642 Malignant neoplasm of left kidney, except renal pelvis: Secondary | ICD-10-CM | POA: Diagnosis not present

## 2018-07-31 DIAGNOSIS — N183 Chronic kidney disease, stage 3 (moderate): Secondary | ICD-10-CM | POA: Diagnosis not present

## 2018-07-31 DIAGNOSIS — F0390 Unspecified dementia without behavioral disturbance: Secondary | ICD-10-CM | POA: Diagnosis not present

## 2018-07-31 DIAGNOSIS — J029 Acute pharyngitis, unspecified: Secondary | ICD-10-CM | POA: Diagnosis not present

## 2018-07-31 DIAGNOSIS — J441 Chronic obstructive pulmonary disease with (acute) exacerbation: Secondary | ICD-10-CM | POA: Diagnosis not present

## 2018-07-31 DIAGNOSIS — Z209 Contact with and (suspected) exposure to unspecified communicable disease: Secondary | ICD-10-CM | POA: Diagnosis not present

## 2018-07-31 DIAGNOSIS — J181 Lobar pneumonia, unspecified organism: Secondary | ICD-10-CM | POA: Diagnosis not present

## 2018-07-31 DIAGNOSIS — K219 Gastro-esophageal reflux disease without esophagitis: Secondary | ICD-10-CM

## 2018-07-31 NOTE — Telephone Encounter (Signed)
07/28/2018 Called Amy [name] to follow up with him about Medicare Transportation. He was able to contact them and arrange transportation for [employer].MA

## 2018-07-31 NOTE — Telephone Encounter (Signed)
Left detailed VM with Stacy. CRM created.

## 2018-08-01 ENCOUNTER — Ambulatory Visit: Payer: Medicare HMO | Admitting: Hematology and Oncology

## 2018-08-01 DIAGNOSIS — J9601 Acute respiratory failure with hypoxia: Secondary | ICD-10-CM | POA: Diagnosis not present

## 2018-08-01 NOTE — Telephone Encounter (Signed)
Refill request received for pantoprazole I do not see this on her medicine list, either current or previous Discharge summary notes to resume famotidine  Denied

## 2018-08-02 DIAGNOSIS — J441 Chronic obstructive pulmonary disease with (acute) exacerbation: Secondary | ICD-10-CM | POA: Diagnosis not present

## 2018-08-02 DIAGNOSIS — N183 Chronic kidney disease, stage 3 (moderate): Secondary | ICD-10-CM | POA: Diagnosis not present

## 2018-08-02 DIAGNOSIS — C642 Malignant neoplasm of left kidney, except renal pelvis: Secondary | ICD-10-CM | POA: Diagnosis not present

## 2018-08-02 DIAGNOSIS — E1122 Type 2 diabetes mellitus with diabetic chronic kidney disease: Secondary | ICD-10-CM | POA: Diagnosis not present

## 2018-08-02 DIAGNOSIS — J029 Acute pharyngitis, unspecified: Secondary | ICD-10-CM | POA: Diagnosis not present

## 2018-08-02 DIAGNOSIS — J440 Chronic obstructive pulmonary disease with acute lower respiratory infection: Secondary | ICD-10-CM | POA: Diagnosis not present

## 2018-08-02 DIAGNOSIS — F0390 Unspecified dementia without behavioral disturbance: Secondary | ICD-10-CM | POA: Diagnosis not present

## 2018-08-02 DIAGNOSIS — I129 Hypertensive chronic kidney disease with stage 1 through stage 4 chronic kidney disease, or unspecified chronic kidney disease: Secondary | ICD-10-CM | POA: Diagnosis not present

## 2018-08-02 DIAGNOSIS — J181 Lobar pneumonia, unspecified organism: Secondary | ICD-10-CM | POA: Diagnosis not present

## 2018-08-02 NOTE — Telephone Encounter (Signed)
Erroneous entry

## 2018-08-03 ENCOUNTER — Other Ambulatory Visit: Payer: Medicare HMO | Admitting: Primary Care

## 2018-08-03 ENCOUNTER — Other Ambulatory Visit: Payer: Self-pay

## 2018-08-03 DIAGNOSIS — J441 Chronic obstructive pulmonary disease with (acute) exacerbation: Secondary | ICD-10-CM | POA: Diagnosis not present

## 2018-08-03 DIAGNOSIS — Z515 Encounter for palliative care: Secondary | ICD-10-CM | POA: Diagnosis not present

## 2018-08-03 DIAGNOSIS — G301 Alzheimer's disease with late onset: Secondary | ICD-10-CM | POA: Diagnosis not present

## 2018-08-03 DIAGNOSIS — G894 Chronic pain syndrome: Secondary | ICD-10-CM | POA: Diagnosis not present

## 2018-08-03 DIAGNOSIS — J189 Pneumonia, unspecified organism: Secondary | ICD-10-CM | POA: Diagnosis not present

## 2018-08-03 DIAGNOSIS — F02818 Dementia in other diseases classified elsewhere, unspecified severity, with other behavioral disturbance: Secondary | ICD-10-CM

## 2018-08-03 DIAGNOSIS — F0281 Dementia in other diseases classified elsewhere with behavioral disturbance: Secondary | ICD-10-CM

## 2018-08-03 LAB — GASTROINTESTINAL PATHOGEN PANEL PCR
C. difficile Tox A/B, PCR: NOT DETECTED
CRYPTOSPORIDIUM, PCR: NOT DETECTED
Campylobacter, PCR: NOT DETECTED
E coli (ETEC) LT/ST PCR: NOT DETECTED
E coli (STEC) stx1/stx2, PCR: NOT DETECTED
E coli 0157, PCR: NOT DETECTED
Giardia lamblia, PCR: NOT DETECTED
Norovirus, PCR: NOT DETECTED
Rotavirus A, PCR: NOT DETECTED
Salmonella, PCR: NOT DETECTED
Shigella, PCR: NOT DETECTED

## 2018-08-03 NOTE — Progress Notes (Signed)
[facility] Palliative Care Consult Note Telephone: [phone]  Fax: [phone]  PATIENT NAME: Amy [name] DOB: [birth year] MRN: [id number]  PRIMARY CARE PROVIDER:   Arnetha Courser, MD  REFERRING PROVIDER:  Arnetha Courser, MD [address] [place], [place] [address]  RESPONSIBLE PARTY:   Extended Emergency Contact Information Primary Emergency Contact: Kryder,[name] Address: [address]          [place], [place] [address] Johnnette Litter of [place] Phone: [phone] Mobile Phone: [phone] Relation: Spouse Secondary Emergency Contact: Genia, Perin Mobile Phone: [phone] Relation: Son   ASSESSMENT and RECOMMENDATIONS: [place] was asked to consult on the case of Amy [name]. This is the initial visit.  1. Mobility: Recommend new gel cushion for chair. Has one that is worn out. H/o Left AKA due to arterial disease and complications of blood clots. Has attendant who transfers her to chair with pivot and gait belt. Patient spends most of her time in recliner, and is at risk for skin breakdown.  2. Pain: Recommend ATC acetaminophen for pain management. Denies pain, has been on norco via pain clinic. Unclear if she is still attending. Husband states he has weaned her to one norco a day.  3. Dyspnea: Continue Home Oxygen and nebulizers tid. Encourage IS, has device at chairside. Has oxygen for low PO2 s/p several hospitalizations recently for pneumonia. Satting at 95% on 2 L and seems comfortable, can converse without dyspnea. Poor stamina due to deconditioning.   4. Anorexia/early satiety: Recommend dietary supplements. Husband has some boost, and offered to patient who was able to drink a little bit. States some lack of appetite from hospital, but improving. Encouraged to get more boost in her favorite flavor, and to eat smaller portions but frequently.  5. Goals of Care: DNR on record from  hospital. Will introduce MOST form on next visit. Husband is in home and patient has one child and he has 2 per her report. Husband states the adult children are involved in their care and check on them often. There is a paid care giver in the home at all times.  Palliative Medicine will follow for symptom management, goals of care clarification. Return 4 weeks or PRN.  I spent 60 minutes providing this consultation,  from 0930 to 1030. More than 50% of the time in this consultation was spent coordinating communication.   HISTORY OF PRESENT ILLNESS:  Amy [name] is a 83 y.o. year old female with multiple medical problems including DM2 , PVD, COPD, Alzheimers dementia, CKD, renal cell ca . Palliative Care was asked to help address goals of care.   CODE STATUS: DNR  PPS: 30% HOSPICE ELIGIBILITY/DIAGNOSIS: TBD  PAST MEDICAL HISTORY:  Past Medical History:  Diagnosis Date  . Anemia    low iron  . Anxiety   . Arthritis   . Bilateral cataracts   . Bronchitis    hx of   . Cataract   . Chronic kidney disease   . Depression   . Diabetes mellitus without complication ([place])   . 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 GERD (gastroesophageal reflux disease)   . Headache   . History of frequent urinary tract infections   . Hx of renal cell cancer    LEFT  . Hyperlipidemia   . Hypertension   . Numbness and tingling    right hand   .  Obesity   . Osteopenia 10/05/2017  . Peripheral artery disease ([place])   . Urinary frequency   . Urinary incontinence     SOCIAL HX:  Social History   Tobacco Use  . Smoking status: Former Smoker    Packs/day: 1.50    Years: 51.00    Pack years: 76.50    Types: Cigarettes    Last attempt to quit: 05/24/2004    Years since quitting: 14.2  . Smokeless tobacco: Never Used  . Tobacco comment: smoking cessation materials not required  Substance Use Topics  . Alcohol use: Not Currently    ALLERGIES:   Allergies  Allergen Reactions  . Fentanyl Other (See Comments)    urine retention  . Penicillins Itching, Rash and Other (See Comments)    Has patient had a PCN reaction causing immediate rash, facial/tongue/throat swelling, SOB or lightheadedness with hypotension: Yes Has patient had a PCN reaction causing severe rash involving mucus membranes or skin necrosis: No Has patient had a PCN reaction that required hospitalization: No Has patient had a PCN reaction occurring within the last 10 years: Yes If all of the above answers are "NO", then may proceed with Cephalosporin use.     PERTINENT MEDICATIONS:  Outpatient Encounter Medications as of 08/03/2018  Medication Sig  . acetaminophen (TYLENOL) 325 MG tablet Take 2 tablets (650 mg total) by mouth every 6 (six) hours as needed for mild pain (or Fever >/= 101).  [name] albuterol (PROVENTIL HFA;VENTOLIN HFA) 108 (90 Base) MCG/ACT inhaler Inhale 2 puffs into the lungs every 6 (six) hours as needed for wheezing or shortness of breath.  [name] atorvastatin (LIPITOR) 20 MG tablet TAKE 1 TABLET (20 MG TOTAL) BY MOUTH AT BEDTIME.  . clopidogrel (PLAVIX) 75 MG tablet Take 1 tablet (75 mg total) by mouth daily.  [name] donepezil (ARICEPT) 10 MG tablet Take 1 tablet (10 mg total) by mouth at bedtime.  . famotidine (PEPCID) 20 MG tablet Take 1 tablet (20 mg total) by mouth at bedtime. To help prevent heartburn, reflux  . FLUoxetine (PROZAC) 10 MG capsule Take 1 capsule (10 mg total) by mouth daily.  [name] gabapentin (NEURONTIN) 300 MG capsule Take 1 capsule (300 mg total) by mouth 3 (three) times daily.  [name] HYDROcodone-acetaminophen (NORCO) 7.5-325 MG tablet Take 1 tablet by mouth every 8 (eight) hours as needed for up to 30 days. For chronic pain.  To fill on or after: 06-21-2018, 07-21-2018, 08-20-2018  . ipratropium-albuterol (DUONEB) 0.5-2.5 (3) MG/3ML SOLN INHALE THE CONTENTS OF 1 VIAL VIA NEBULIZER THREE TIMES DAILY AS DIRECTED (Patient taking differently: Inhale 3 mLs  into the lungs 3 (three) times daily. )  . QUEtiapine (SEROQUEL) 25 MG tablet Take 1 tablet (25 mg total) by mouth See admin instructions. Take 1 tablet in a.m. and 1 tablet in the afternoon and 2 tablets(50 mg total) before bedtime around 8 to 9 PM  . sulfamethoxazole-trimethoprim (BACTRIM) 400-80 MG tablet Take 1 tablet by mouth 2 (two) times daily for 7 days.  . Zinc Oxide (DESITIN RAPID RELIEF) 13 % CREA Apply 1 application topically 3 (three) times daily as needed.   No facility-administered encounter medications on file as of 08/03/2018.     PHYSICAL EXAM:   156 lbs, 62" per hospital record. 97.8-97-22  117/51, Pulse ox. 95% on 2 L oxygen at rest. General: NAD, frail appearing, poor appetite and poor sleep,  Cardiovascular: regular rate and rhythm, S1S2 Pulmonary: clear left fields, occ rhonchi on right lower lobe. Moving  air well, using nebulizer tid Abdomen: soft, nontender, + bowel sounds, endorses loose stools Extremities: no edema, left AKA (remote) Skin: no rashes, CNA endorses chronic sacral wound, closed now but easily reopens. Uses cushion on chair Neurological: Weakness, dementia. Conversant but states wrong years, wrong events. FAST score 6 c-d.  Cyndia Skeeters DNP, AGPCNP-BC

## 2018-08-08 DIAGNOSIS — J441 Chronic obstructive pulmonary disease with (acute) exacerbation: Secondary | ICD-10-CM | POA: Diagnosis not present

## 2018-08-08 DIAGNOSIS — Z7951 Long term (current) use of inhaled steroids: Secondary | ICD-10-CM | POA: Diagnosis not present

## 2018-08-08 DIAGNOSIS — J42 Unspecified chronic bronchitis: Secondary | ICD-10-CM | POA: Diagnosis not present

## 2018-08-08 DIAGNOSIS — Z89612 Acquired absence of left leg above knee: Secondary | ICD-10-CM | POA: Diagnosis not present

## 2018-08-08 DIAGNOSIS — F0390 Unspecified dementia without behavioral disturbance: Secondary | ICD-10-CM | POA: Diagnosis not present

## 2018-08-08 DIAGNOSIS — N183 Chronic kidney disease, stage 3 (moderate): Secondary | ICD-10-CM | POA: Diagnosis not present

## 2018-08-08 DIAGNOSIS — J440 Chronic obstructive pulmonary disease with acute lower respiratory infection: Secondary | ICD-10-CM | POA: Diagnosis not present

## 2018-08-08 DIAGNOSIS — E1122 Type 2 diabetes mellitus with diabetic chronic kidney disease: Secondary | ICD-10-CM | POA: Diagnosis not present

## 2018-08-08 DIAGNOSIS — J181 Lobar pneumonia, unspecified organism: Secondary | ICD-10-CM | POA: Diagnosis not present

## 2018-08-08 DIAGNOSIS — S31125A Laceration of abdominal wall with foreign body, periumbilic region without penetration into peritoneal cavity, initial encounter: Secondary | ICD-10-CM | POA: Diagnosis not present

## 2018-08-10 ENCOUNTER — Inpatient Hospital Stay: Payer: Self-pay | Admitting: Family Medicine

## 2018-08-10 DIAGNOSIS — E1122 Type 2 diabetes mellitus with diabetic chronic kidney disease: Secondary | ICD-10-CM | POA: Diagnosis not present

## 2018-08-10 DIAGNOSIS — N183 Chronic kidney disease, stage 3 (moderate): Secondary | ICD-10-CM | POA: Diagnosis not present

## 2018-08-10 DIAGNOSIS — F0390 Unspecified dementia without behavioral disturbance: Secondary | ICD-10-CM | POA: Diagnosis not present

## 2018-08-10 DIAGNOSIS — J181 Lobar pneumonia, unspecified organism: Secondary | ICD-10-CM | POA: Diagnosis not present

## 2018-08-10 DIAGNOSIS — J441 Chronic obstructive pulmonary disease with (acute) exacerbation: Secondary | ICD-10-CM | POA: Diagnosis not present

## 2018-08-10 DIAGNOSIS — Z89612 Acquired absence of left leg above knee: Secondary | ICD-10-CM | POA: Diagnosis not present

## 2018-08-10 DIAGNOSIS — Z7951 Long term (current) use of inhaled steroids: Secondary | ICD-10-CM | POA: Diagnosis not present

## 2018-08-10 DIAGNOSIS — J440 Chronic obstructive pulmonary disease with acute lower respiratory infection: Secondary | ICD-10-CM | POA: Diagnosis not present

## 2018-08-16 DIAGNOSIS — E1122 Type 2 diabetes mellitus with diabetic chronic kidney disease: Secondary | ICD-10-CM | POA: Diagnosis not present

## 2018-08-16 DIAGNOSIS — Z7951 Long term (current) use of inhaled steroids: Secondary | ICD-10-CM | POA: Diagnosis not present

## 2018-08-16 DIAGNOSIS — J181 Lobar pneumonia, unspecified organism: Secondary | ICD-10-CM | POA: Diagnosis not present

## 2018-08-16 DIAGNOSIS — J440 Chronic obstructive pulmonary disease with acute lower respiratory infection: Secondary | ICD-10-CM | POA: Diagnosis not present

## 2018-08-16 DIAGNOSIS — Z89612 Acquired absence of left leg above knee: Secondary | ICD-10-CM | POA: Diagnosis not present

## 2018-08-16 DIAGNOSIS — F0390 Unspecified dementia without behavioral disturbance: Secondary | ICD-10-CM | POA: Diagnosis not present

## 2018-08-16 DIAGNOSIS — J441 Chronic obstructive pulmonary disease with (acute) exacerbation: Secondary | ICD-10-CM | POA: Diagnosis not present

## 2018-08-16 DIAGNOSIS — N183 Chronic kidney disease, stage 3 (moderate): Secondary | ICD-10-CM | POA: Diagnosis not present

## 2018-08-17 ENCOUNTER — Telehealth: Payer: Self-pay | Admitting: Family Medicine

## 2018-08-17 NOTE — Telephone Encounter (Signed)
Please find out the call back number and gain some details regarding the patient's status.

## 2018-08-17 NOTE — Telephone Encounter (Signed)
Copied from [place] [phone]. Topic: Quick Communication - See Telephone Encounter >> Aug 17, 2018 10:49 AM Rayann Heman wrote: CRM for notification. See Telephone encounter for: 08/17/18. Larena Glassman calling with [facility] called and stated that patient is having a non productive cough. Pt also has a runny nose with right rib pain. Pt also has a low temp of 99.3. Larena Glassman would like a call back. please advise.

## 2018-08-18 NOTE — Telephone Encounter (Signed)
Amy [name] said her lungs did not sound great at their visit on 08/15/18 and she was very congested with a loose non productive cough and clear nasal drainage. Amy [name] wants to know if you would like to put in an order for a portable chest xray. Amy [name] spoke with patients spouse yesterday, 08/17/18 and he stated that the patient was feeling much better. Please advise.

## 2018-08-18 NOTE — Telephone Encounter (Signed)
If continuing to improve, no further action.

## 2018-08-23 ENCOUNTER — Ambulatory Visit: Payer: Medicare HMO | Admitting: Psychiatry

## 2018-08-23 NOTE — Telephone Encounter (Addendum)
Pt husband, [name], called to check why medication was denied. Pt has 10 days left and uses mail order. [name] denies pt having famotidine. Please advise.  Call back # [phone]  [place], [place] [phone] (Phone) [phone] (Fax)

## 2018-08-24 ENCOUNTER — Encounter
Admission: RE | Admit: 2018-08-24 | Discharge: 2018-08-24 | Disposition: A | Discharge status: Home / Self Care | Payer: Medicare HMO | Source: Ambulatory Visit | Attending: Urgent Care | Admitting: Urgent Care

## 2018-08-24 ENCOUNTER — Other Ambulatory Visit: Payer: Self-pay

## 2018-08-24 DIAGNOSIS — R911 Solitary pulmonary nodule: Secondary | ICD-10-CM

## 2018-08-24 DIAGNOSIS — D49519 Neoplasm of unspecified behavior of unspecified kidney: Secondary | ICD-10-CM | POA: Diagnosis not present

## 2018-08-24 DIAGNOSIS — J440 Chronic obstructive pulmonary disease with acute lower respiratory infection: Secondary | ICD-10-CM | POA: Diagnosis not present

## 2018-08-24 DIAGNOSIS — Z7951 Long term (current) use of inhaled steroids: Secondary | ICD-10-CM | POA: Diagnosis not present

## 2018-08-24 DIAGNOSIS — F0390 Unspecified dementia without behavioral disturbance: Secondary | ICD-10-CM | POA: Diagnosis not present

## 2018-08-24 DIAGNOSIS — E1122 Type 2 diabetes mellitus with diabetic chronic kidney disease: Secondary | ICD-10-CM | POA: Diagnosis not present

## 2018-08-24 DIAGNOSIS — I2510 Atherosclerotic heart disease of native coronary artery without angina pectoris: Secondary | ICD-10-CM | POA: Diagnosis not present

## 2018-08-24 DIAGNOSIS — Z89612 Acquired absence of left leg above knee: Secondary | ICD-10-CM | POA: Diagnosis not present

## 2018-08-24 DIAGNOSIS — J441 Chronic obstructive pulmonary disease with (acute) exacerbation: Secondary | ICD-10-CM | POA: Diagnosis not present

## 2018-08-24 DIAGNOSIS — J181 Lobar pneumonia, unspecified organism: Secondary | ICD-10-CM | POA: Diagnosis not present

## 2018-08-24 DIAGNOSIS — N183 Chronic kidney disease, stage 3 (moderate): Secondary | ICD-10-CM | POA: Diagnosis not present

## 2018-08-24 LAB — GLUCOSE, CAPILLARY: Glucose-Capillary: 86 mg/dL (ref 70–99)

## 2018-08-24 MED ORDER — FLUDEOXYGLUCOSE F - 18 (FDG) INJECTION
7.9600 | Freq: Once | INTRAVENOUS | Status: AC | PRN
  Administered 2018-08-24: 7.96 via INTRAVENOUS

## 2018-08-24 MED ORDER — PANTOPRAZOLE SODIUM 40 MG PO TBEC: 40.0000 mg | DELAYED_RELEASE_TABLET | Freq: Every day | ORAL | 0 refills | Status: DC

## 2018-08-24 NOTE — Telephone Encounter (Signed)
At the office visit in February, 2020, pantoprazole was reported to me as "not taking"  Discharge summary said resume famotidine: "famotidine 20 MG tablet Commonly known as:  PEPCID Take 1 tablet (20 mg total) by mouth at bedtime. To help prevent heartburn, reflux"  I was just going by the chart  If he says she doesn't take famotidine and is supposed to be on pantoprazole, then I'm fine refilling pantoprazole  Rx sent, please let him know

## 2018-08-24 NOTE — Addendum Note (Signed)
Addended by: Danyl Deems, Satira Anis on: 08/24/2018 08:10 AM   Modules accepted: Orders

## 2018-08-25 ENCOUNTER — Telehealth: Payer: Self-pay | Admitting: Hematology and Oncology

## 2018-08-25 ENCOUNTER — Other Ambulatory Visit: Payer: Medicare HMO

## 2018-08-27 DIAGNOSIS — R918 Other nonspecific abnormal finding of lung field: Secondary | ICD-10-CM | POA: Insufficient documentation

## 2018-08-27 DIAGNOSIS — R948 Abnormal results of function studies of other organs and systems: Secondary | ICD-10-CM | POA: Insufficient documentation

## 2018-08-27 NOTE — Progress Notes (Signed)
[facility]  [address], [address] [place], [place] [address] Phone: [phone]  Fax: [phone]   Telephone Office Visit:  08/28/2018   Referring physician:  Arnetha Courser, MD   I connected with Amy [name] on 08/28/18 at 2:41 PM EDT by telephone and verified that I was speaking with the correct person using 2 identifiers.  The patient was at home.  I discussed the limitations, risk, security and privacy concerns of performing an evaluation and management service by telephone and the availability of in person appointments.  I also discussed with the patient that there may be a patient responsible charge related to this service.  The patient expressed understanding and agreed to proceed.    Chief Complaint: Amy [name] is a 83 y.o. female with iron deficiency anemia and a history of stage III renal cell carcinoma who is seen for review of interval PET scan and discussion regarding direction of therapy.  HPI:   The patient was last seen in the medical oncology clinic on 05/31/2018.  At that time, she denied any new complaints.  Exam revealed soft intermittent wheezes.  Hemoglobin was 12.0.  Ferritin was 25.  We discussed plans for follow-up imaging.  The patient was admitted to to [facility] from 07/25/2018 - 07/27/2018 with acute respiratory failure with hypoxia.  She had hypoxia due to intermittent bronchospasm in the setting of recent pneumonia. She had significant drop in oxygen on transfer and thus needed home oxygen. She was also treated for ESBL E. coli UTI with IV meropenem and switched to Bactrim on discharge.  PET scan on 08/24/2018 revealed a 2.5 cm potential lesion in the stomach just below the GE junction has a maximum SUV of 16.2. Although occasionally physiologic activity can simulate a lesion, a gastric malignancy was not excluded particularly in light of the focal nature of this activity.  A full diagnostic upper endoscopy with special  attention to the stomach just below the GE junction should be strongly considered to rule out gastric malignancy.  There was also some accentuated activity in left upper quadrant jejunal loop, but this was more likely to be artifactual given the lack of a CT correlate.  PET findings in the chest were complex, with low-grade activity which could be reactive/inflammatory or due to low-grade malignancy. There were a variety of mildly enlarged lymph nodes, some with activity mildly above the background blood pool.  Most of the pulmonary nodules had low-grade activity meriting surveillance to exclude low-grade adenocarcinoma, but quite likely benign. The airspace opacity posteriorly in the aerated portion of the right lower lobe had low activity, probably reflecting rounded atelectasis. There was a bandlike subpleural density in the superior segment right lower lobe which was mildly hypermetabolic (SUV 3.1), but with a sub solid appearance and contour atypical for malignancy.  Chest findings should be surveilled.  There was a small right pleural effusion that had accentuated activity suggesting possible exudative effusion.  There was accentuated activity in the left eighth rib laterally (SUV 2.9), favor small healing nondisplaced rib fracture which was otherwise occult. This area could be monitored along with the rest of the chest with follow up CT.   During the interim, she notes still having issues with cough and wheezing.  She has shortness of breath on exertion.  She uses a nebulizer 3 times a day.  She denies any fever.  She has not gone outside secondary to the COVID-19 pandemic.   Past Medical History:  Diagnosis Date   Anemia    low iron   Anxiety    Arthritis    Bilateral cataracts    Bronchitis    hx of    Cataract    Chronic kidney disease    Depression    Diabetes mellitus without complication (HCC)    DVT (deep venous thrombosis) (HCC)    hx of in left leg currently has left  AKA    GERD (gastroesophageal reflux disease)    Headache    History of frequent urinary tract infections    Hx of renal cell cancer    LEFT   Hyperlipidemia    Hypertension    Numbness and tingling    right hand    Obesity    Osteopenia 10/05/2017   Peripheral artery disease (HCC)    Urinary frequency    Urinary incontinence     Past Surgical History:  Procedure Laterality Date   ABDOMINAL HYSTERECTOMY  1978   CARPAL TUNNEL RELEASE Bilateral    CHOLECYSTECTOMY     COLONOSCOPY     COLONOSCOPY WITH ESOPHAGOGASTRODUODENOSCOPY (EGD)     ERCP N/A 08/19/2017   Procedure: ENDOSCOPIC RETROGRADE CHOLANGIOPANCREATOGRAPHY (ERCP);  Surgeon: Arta Silence, MD;  Location: [facility] ENDOSCOPY;  Service: Endoscopy;  Laterality: N/A;   LEG AMPUTATION     above the knee / left    ROBOT ASSISTED LAPAROSCOPIC NEPHRECTOMY Left 01/15/2015   Procedure: ROBOTIC ASSISTED LAPAROSCOPIC RADICAL NEPHRECTOMY;  Surgeon: Alexis Frock, MD;  Location: [facility] ORS;  Service: Urology;  Laterality: Left;   stent placement right leg       Family History  Problem Relation Age of Onset   Cancer Mother        Stomach   Cancer Father    Diabetes Brother    Cancer Brother        oral   Healthy Son    Healthy Sister     Social History:  reports that she quit smoking about 14 years ago. Her smoking use included cigarettes. She has a 76.50 pack-year smoking history. She has never used smokeless tobacco. She reports previous alcohol use. She reports that she does not use drugs.  She is an Therapist, sports.  She lives in [place].  Hes husband's name is [name], today.  Participants in the patient's visit included the patient, her husband, and [employer], Therapist, sports, today.  The intake visit was provided by Waymon Budge, RN.    Allergies:  Allergies  Allergen Reactions   Fentanyl Other (See Comments)    urine retention   Penicillins Itching, Rash and Other (See Comments)    Has patient had a PCN  reaction causing immediate rash, facial/tongue/throat swelling, SOB or lightheadedness with hypotension: Yes Has patient had a PCN reaction causing severe rash involving mucus membranes or skin necrosis: No Has patient had a PCN reaction that required hospitalization: No Has patient had a PCN reaction occurring within the last 10 years: Yes If all of the above answers are "NO", then may proceed with Cephalosporin use.    Current Medications: Current Outpatient Medications  Medication Sig Dispense Refill   acetaminophen (TYLENOL) 325 MG tablet Take 2 tablets (650 mg total) by mouth every 6 (six) hours as needed for mild pain (or Fever >/= 101).     atorvastatin (LIPITOR) 20 MG tablet TAKE 1 TABLET (20 MG TOTAL) BY MOUTH AT BEDTIME. 90 tablet 0   clopidogrel (PLAVIX) 75 MG tablet Take 1 tablet (75 mg total) by mouth daily. 90 tablet  3   donepezil (ARICEPT) 10 MG tablet Take 1 tablet (10 mg total) by mouth at bedtime. 90 tablet 3   famotidine (PEPCID) 20 MG tablet Take 1 tablet (20 mg total) by mouth at bedtime. To help prevent heartburn, reflux 90 tablet 1   FLUoxetine (PROZAC) 10 MG capsule Take 1 capsule (10 mg total) by mouth daily. 30 capsule 0   gabapentin (NEURONTIN) 300 MG capsule Take 1 capsule (300 mg total) by mouth 3 (three) times daily. 270 capsule 3   ipratropium-albuterol (DUONEB) 0.5-2.5 (3) MG/3ML SOLN INHALE THE CONTENTS OF 1 VIAL VIA NEBULIZER THREE TIMES DAILY AS DIRECTED (Patient taking differently: Inhale 3 mLs into the lungs 3 (three) times daily. ) 810 mL 2   pantoprazole (PROTONIX) 40 MG tablet Take 1 tablet (40 mg total) by mouth daily. 90 tablet 0   QUEtiapine (SEROQUEL) 25 MG tablet Take 1 tablet (25 mg total) by mouth See admin instructions. Take 1 tablet in a.m. and 1 tablet in the afternoon and 2 tablets(50 mg total) before bedtime around 8 to 9 PM 150 tablet 0   Zinc Oxide (DESITIN RAPID RELIEF) 13 % CREA Apply 1 application topically 3 (three) times daily  as needed. 1 Tube 0   albuterol (PROVENTIL HFA;VENTOLIN HFA) 108 (90 Base) MCG/ACT inhaler Inhale 2 puffs into the lungs every 6 (six) hours as needed for wheezing or shortness of breath. (Patient not taking: Reported on 08/28/2018) 1 Inhaler 2   No current facility-administered medications for this visit.     Review of Systems:  GENERAL:  Fatigue.  Staying inside.  No fevers, sweats.  Some weight loss. PERFORMANCE STATUS (ECOG):  2-3 HEENT:  No visual changes, runny nose, sore throat, mouth sores or tenderness. Lungs:  Shortness of breath with exertion.  Chronic cough.  Wheezing.  No hemoptysis. Cardiac:  No chest pain, palpitations, orthopnea, or PND. GI:  Heartburn at night.  Not eating well.  No nausea, vomiting, diarrhea, constipation, melena or hematochezia. GU:  No urgency, frequency, dysuria, or hematuria. Musculoskeletal:  No back pain.  No joint pain.  No muscle tenderness. Extremities:  No pain or swelling. Skin:  No rashes or skin changes. Neuro:  No headache, numbness or weakness, balance or coordination issues. Endocrine:  No diabetes, thyroid issues, hot flashes or night sweats. Psych:  No mood changes, depression or anxiety. Pain:  No focal pain. Review of systems:  All other systems reviewed and found to be negative.    Imaging studies: 11/29/2014:  Abdomen and pelvic CT revealed a 5.5 x 3.7 solid enhancing mass arising from the upper pole of the left kidney.  There was evidence of renal vein invasion.  There were borderline enlarged periaortic lymph node (9 mm).  08/16/2016:  Chest, abdomen, and pelvic CT revealed left nephrectomy without evidence of metastatic disease.  There were low-attenuation lesions in the right kidney, likely cysts although definitive characterization is limited without post-contrast imaging.  There was an ectatic abdominal aorta at risk for aneurysm development.  02/16/2017:  Chest, abdomen and pelvic CT revealed no evidence of recurrent disease.   There was a 7 x 5 mm right upper lobe pulmonary nodule (previously 6 x 4 mm). There was a 3 mm (previously 1-2 mm) left lower lobe pulmonary nodule. 08/12/2017:  Chest CT revealed no active pulmonary disease. Mild centrilobular emphysema. There was stable appearing pleural-based nodular opacity in the anterior aspect of the right upper lobe measuring 5.6 mm. There was stable 4 mm medial left  upper lobe pleural-based opacity is unchanged.  08/12/2017:  Abdomen and pelvic CT revealed a 9 mm hyperdensity along the expected location of the distal common bile duct near the ampulla with dilatation of the CBD to 17 mm.  Simple renal cysts of the right kidney were noted with a 1.1 cm hyperdense complex appearing lesion possibly representing a hemorrhagic or proteinaceous cyst.  No worrisome features were identified with respect of this complex lesion.   08/14/2017:  MR abdomen MRCP revealed a solitary 9 mm choledocholith in the mid to upper common bile duct. There was mild diffuse intrahepatic biliary ductal dilatation with dilated CBD (13 mm diameter).  There was indeterminate small 1.2 cm renal cortical mass in the posterior lower right kidney, incompletely characterized on this noncontrast MRI study, renal neoplasm not excluded. Recommend attention on follow-up MRI (preferred) or CT abdomen without and with IV contrast in 6 months.  There was ectatic 2.7 cm infrarenal abdominal aorta.  Ectatic abdominal aorta at risk for aneurysm development. Recommend followup by ultrasound in 5 years. 02/13/2018:  Chest, abdomen, and pelvic CT  revealed interval growth of 8 mm solid medial left lower lobe pulmonary nodule, cannot exclude enlarging pulmonary metastasis. This nodule was slightly below PET resolution. There was interval growth of 9 mm medial left upper lobe ground-glass pulmonary nodule. There were no additional potential findings of metastatic disease.  There was no evidence of local tumor recurrence in the left  nephrectomy bed.  There was an ectatic 2.8 cm infrarenal abdominal aorta, stable at risk for aneurysm development.  05/26/2018:  Chest CT revealed further enlargement of part solid left upper lobe nodule, potentially adenocarcinoma.  Left upper lobe measured 12 x 8 mm (previously 9 mm maximally). The left lower lobe solid nodule had not significantly changed over the last 3 months, although had enlarged over the last 10 months and could reflect a metastasis.  There was new mild right paratracheal lymphadenopathy. Consider PET-CT for further evaluation.  Alternatively, continued short-term CT follow-up could be performed. 08/24/2018:  PET scan revealed a 2.5 cm potential lesion in the stomach just below the GE junction has a maximum SUV of 16.2. There was also some accentuated activity in left upper quadrant jejunal loop.  There were a variety of mildly enlarged lymph nodes, some with activity mildly above the background blood pool.  Most of the pulmonary nodules had low-grade activity (benign versus low-grade adenocarcinoma).  The airspace opacity posteriorly in the aerated portion of the right lower lobe had low activity, probably reflecting rounded atelectasis. There was a bandlike subpleural density in the superior segment right lower lobe which was mildly hypermetabolic (SUV 3.1), but with a sub solid appearance and contour atypical for malignancy.  There was a small right pleural effusion that had accentuated activity suggesting possible exudative effusion.  There was accentuated activity in the left eighth rib laterally (SUV 2.9), favor small healing nondisplaced rib fracture which was otherwise occult.   No visits with results within 3 Day(s) from this visit.  Latest known visit with results is:  Hospital Outpatient Visit on 08/24/2018  Component Date Value Ref Range Status   Glucose-Capillary 08/24/2018 86  70 - 99 mg/dL Final    Assessment:  Amy [name] is a 83 y.o. female with mild  iron deficiency anemia.  EGD and colonoscopy on 10/10/2013 were negative per patient.  She has had reflux for the past 15-30 years.  She is on Prilosec.  Diet is poor.  She denies any  pica.  Work-up on 07/14/2016 revealed a hematocrit of 35.5, hemoglobin 11.6, MCV 87.6, platelets 173,000, WBC 7900 with an ANC of 5000.  SPEP revealed no monoclonal protein.  Ferritin was 22 (low).  Folate was 9.8.  Guaiac cards were negative x 3.  She is on oral iron.  She has a history of stage III renal cell carcinoma s/p robotic-assisted laparoscopic left radical nephrectomy on 01/09/2015. Pathology revealed a 6.3 cm renal cell carcinoma.  Tumor was predominantly clear cell with a minor component of type I papillary.  Fuhrman grade 3 of 4.  Tumor extended into the renal vein.  There was focal extension into the perirenal and sinus adipose tissue.  Margins were negative.  There was metastatic tumor in 2 of 2 lymph nodes.  Pathologic stage was [id number].    Abdomen and pelvic CT scan on 11/29/2014 revealed a 5.5 x 3.7 solid enhancing mass arising from the upper pole of the left kidney.  There was evidence of renal vein invasion.  There were borderline enlarged periaortic lymph node (9 mm).   Chest CT on 08/12/2017 revealed no active pulmonary disease. Mild centrilobular emphysema. There was stable appearing pleural-based nodular opacity in the anterior aspect of the right upper lobe measuring 5.6 mm. There was stable 4 mm medial left upper lobe pleural-based opacity is unchanged.   Abdomen and pelvic CT on 08/12/2017 revealed a 9 mm hyperdensity along the expected location of the distal common bile duct near the ampulla with dilatation of the CBD to 17 mm.  Simple renal cysts of the right kidney were noted with a 1.1 cm hyperdense complex appearing lesion possibly representing a hemorrhagic or proteinaceous cyst.  No worrisome features were identified with respect of this complex lesion.    Chest, abdomen, and pelvic CT on  02/13/2018 revealed interval growth of 8 mm solid medial left lower lobe pulmonary nodule, cannot exclude enlarging pulmonary metastasis. This nodule was slightly below PET resolution. There was interval growth of 9 mm medial left upper lobe ground-glass pulmonary nodule. There were no additional potential findings of metastatic disease.  There was no evidence of local tumor recurrence in the left nephrectomy bed.  There was an ectatic 2.8 cm infrarenal abdominal aorta, stable at risk for aneurysm development.   Chest CT on 05/26/2018 revealed further enlargement of part solid left upper lobe nodule, potentially adenocarcinoma.  Left upper lobe measured 12 x 8 mm (previously 9 mm maximally). The left lower lobe solid nodule had not significantly changed over the last 3 months, although had enlarged over the last 10 months and could reflect a metastasis.  There was new mild right paratracheal lymphadenopathy.   PET scan on 08/24/2018 revealed a 2.5 cm potential lesion in the stomach just below the GE junction has a maximum SUV of 16.2. There was also some accentuated activity in left upper quadrant jejunal loop.  There were a variety of mildly enlarged lymph nodes, some with activity mildly above the background blood pool.  Most of the pulmonary nodules had low-grade activity (benign versus low-grade adenocarcinoma).  The airspace opacity posteriorly in the aerated portion of the right lower lobe had low activity, probably reflecting rounded atelectasis. There was a bandlike subpleural density in the superior segment right lower lobe which was mildly hypermetabolic (SUV 3.1), but with a sub solid appearance and contour atypical for malignancy.  There was a small right pleural effusion that had accentuated activity suggesting possible exudative effusion.  There was accentuated activity in the left  eighth rib laterally (SUV 2.9), favor small healing nondisplaced rib fracture which was otherwise occult.  She  underwent ERCP on 08/19/2017 for chloedocholithiasis.  One pancreatic stent was placed into the ventral pancreatic duct.  A filling defect consistent with a stone was seen on the cholangiogram. The entire main bile duct and common hepatic duct were moderately dilated.  Choledocholithiasis was found. Complete removal was accomplished by biliary sphincterotomy and basket extraction.  She has iron deficiency anemia.  Ferritin was 18 on 08/10/2016, 22 on 09/07/2016, 69 on 12/07/2016, 81 on 02/23/2017, 67 on 08/24/2017, 50 on 02/03/2018, 22 on 03/01/2018, and 25 on 05/31/2018.  She has chronic renal insufficiency.  Creatinine was 1.86 (CrCl 26 ml/min) on 06/15/2016.  She has a history of left lower extremity DVT.  She is s/p left AKA.  Symptomatically, she notes chronic respiratory symptoms (shortness of breath with exertion, cough, wheezing).  Plan: 1.   Review PET scan.  PET scan personally reviewed.  Agree with radiology findings. 2.   PET + gastric lesion  Lesion potentially in in the gastric region just below the GE junction.  Discuss referral to GI for direct visualization.  Patient in agreement.  Discuss likely delay in upper endoscopy secondary to COVID-19 pandemic.  Send copy of note to Dr Vira Agar. 3.   Stage III renal cell carcinoma  No evidence of local recurrence on recent PET scan.  Unclear significance of pulmonary nodules.  Continue surveillance. 4.   Pulmonary nodules  Etiology of pulmonary nodules unclear.  Most nodules have low grade activity (benign or low grade adenocarcinoma).  Patient with small right pleural effusion and healing rib lesion.  Follow-up chest CT in 3 months. 5.   Iron deficiency anemia  Hemoglobin is 12.0.  MCV 93.3 on 07/26/2018.  Ferritin was 25 on 05/31/2018. 6.   Send copy of PET scan to patient. 7.   Present at tumor board on 08/31/2018. 8.   Chest CT in 3 months 9.   RTC in 3 months (after chest CT) for MD assessment, labs (CBC with diff, CMP,  ferritin- day before), and review of chest CT.    Addendum:  Patient presented at tumor board.  Agree with referral to GI to investigate lesion in stomach below GE junction.   I discussed the assessment and treatment plan with the patient.  The patient was provided an opportunity to ask questions and all were answered.  The patient agreed with the plan and demonstrated an understanding of the instructions.  The patient was advised to call back or seek an in person evaluation if the symptoms worsen or if the condition fails to improve as anticipated.  I provided 17 minutes (2:41 PM - 2:58 PM) of non-face-to-face time during this encounter.  I provided these services from the [facility] office.   Lequita Asal, MD, PhD  08/28/2018, 2:41 PM

## 2018-08-28 ENCOUNTER — Encounter: Payer: Self-pay | Admitting: Hematology and Oncology

## 2018-08-28 ENCOUNTER — Inpatient Hospital Stay: Payer: Medicare HMO | Attending: Hematology and Oncology | Admitting: Hematology and Oncology

## 2018-08-28 DIAGNOSIS — R918 Other nonspecific abnormal finding of lung field: Secondary | ICD-10-CM | POA: Diagnosis not present

## 2018-08-28 DIAGNOSIS — Z7189 Other specified counseling: Secondary | ICD-10-CM

## 2018-08-28 DIAGNOSIS — R948 Abnormal results of function studies of other organs and systems: Secondary | ICD-10-CM

## 2018-08-28 DIAGNOSIS — D49519 Neoplasm of unspecified behavior of unspecified kidney: Secondary | ICD-10-CM

## 2018-08-28 DIAGNOSIS — D509 Iron deficiency anemia, unspecified: Secondary | ICD-10-CM

## 2018-08-28 DIAGNOSIS — Z85528 Personal history of other malignant neoplasm of kidney: Secondary | ICD-10-CM

## 2018-08-28 NOTE — Progress Notes (Signed)
Confirmed patient name, DOB, and address. With patient's verbal permission, spoke with husband to gather most information. Husband reports patient has been experiencing dry cough x 1 week but denies any other symptoms with the cough (remians afebrile, no new SOB). Patient is on 2L of Oxygen now. Husband also reports patient is experiencing pain In rectal area.

## 2018-08-31 ENCOUNTER — Other Ambulatory Visit: Payer: Medicare HMO

## 2018-08-31 DIAGNOSIS — E1122 Type 2 diabetes mellitus with diabetic chronic kidney disease: Secondary | ICD-10-CM | POA: Diagnosis not present

## 2018-08-31 DIAGNOSIS — N183 Chronic kidney disease, stage 3 (moderate): Secondary | ICD-10-CM | POA: Diagnosis not present

## 2018-08-31 DIAGNOSIS — J181 Lobar pneumonia, unspecified organism: Secondary | ICD-10-CM | POA: Diagnosis not present

## 2018-08-31 DIAGNOSIS — Z89612 Acquired absence of left leg above knee: Secondary | ICD-10-CM | POA: Diagnosis not present

## 2018-08-31 DIAGNOSIS — J441 Chronic obstructive pulmonary disease with (acute) exacerbation: Secondary | ICD-10-CM | POA: Diagnosis not present

## 2018-08-31 DIAGNOSIS — J440 Chronic obstructive pulmonary disease with acute lower respiratory infection: Secondary | ICD-10-CM | POA: Diagnosis not present

## 2018-08-31 DIAGNOSIS — J449 Chronic obstructive pulmonary disease, unspecified: Secondary | ICD-10-CM | POA: Diagnosis not present

## 2018-08-31 DIAGNOSIS — Z7951 Long term (current) use of inhaled steroids: Secondary | ICD-10-CM | POA: Diagnosis not present

## 2018-08-31 DIAGNOSIS — F0390 Unspecified dementia without behavioral disturbance: Secondary | ICD-10-CM | POA: Diagnosis not present

## 2018-08-31 NOTE — Progress Notes (Signed)
Tumor Board Documentation  Amy [name] was presented by Dr Mike Gip at our Tumor Board on 08/31/2018, which included representatives from medical oncology, radiation oncology, surgical oncology, surgical, radiology, pathology, navigation, internal medicine, genetics, research, palliative care.  Amy [name] currently presents as a current patient, for discussion with history of the following treatments: surgical intervention(s).  Additionally, we reviewed previous medical and familial history, history of present illness, and recent lab results along with all available histopathologic and imaging studies. The tumor board considered available treatment options and made the following recommendations: Active surveillance Refer to GI for gastric lesion  The following procedures/referrals were also placed: No orders of the defined types were placed in this encounter.   Clinical Trial Status: not discussed   Staging used: AJCC Stage Group  National site-specific guidelines NCCN were discussed with respect to the case.  Tumor board is a meeting of clinicians from various specialty areas who evaluate and discuss patients for whom a multidisciplinary approach is being considered. Final determinations in the plan of care are those of the provider(s). The responsibility for follow up of recommendations given during tumor board is that of the provider.   Today's extended care, comprehensive team conference, Amy [name] was not present for the discussion and was not examined.   Multidisciplinary Tumor Board is a multidisciplinary case peer review process.  Decisions discussed in the Multidisciplinary Tumor Board reflect the opinions of the specialists present at the conference without having examined the patient.  Ultimately, treatment and diagnostic decisions rest with the primary provider(s) and the patient.

## 2018-09-01 DIAGNOSIS — J9601 Acute respiratory failure with hypoxia: Secondary | ICD-10-CM | POA: Diagnosis not present

## 2018-09-04 ENCOUNTER — Other Ambulatory Visit: Payer: Self-pay | Admitting: Family Medicine

## 2018-09-04 DIAGNOSIS — R9389 Abnormal findings on diagnostic imaging of other specified body structures: Secondary | ICD-10-CM

## 2018-09-06 ENCOUNTER — Telehealth: Payer: Self-pay | Admitting: Gastroenterology

## 2018-09-06 NOTE — Telephone Encounter (Signed)
Pt husband is calling stating pt husband is real inflamed  And he was worried that the apt With  Dr. Bonna Gains is not scheduled until June he thought pt was supposed to have a procedure sooner then this, from what Dr. Mike Gip had mentioned to him?

## 2018-09-06 NOTE — Telephone Encounter (Signed)
Tati,   Returned patients husbands call.  He was concerned about the appt scheduled with  Dr. Bonna Gains being too far out.  Patient is not experiencing any distress.  Reviewed the referral and discussed with Dr. Bonna Gains.    Per Dr.Tahiliani please reschedule patients appt from June 10th to next week.   Please contact patients husband [name] at or after 3pm today as he is outside doing yard work.   Thanks [employer]

## 2018-09-08 ENCOUNTER — Encounter: Payer: Self-pay | Admitting: *Deleted

## 2018-09-08 DIAGNOSIS — S31125A Laceration of abdominal wall with foreign body, periumbilic region without penetration into peritoneal cavity, initial encounter: Secondary | ICD-10-CM | POA: Diagnosis not present

## 2018-09-08 DIAGNOSIS — J42 Unspecified chronic bronchitis: Secondary | ICD-10-CM | POA: Diagnosis not present

## 2018-09-08 DIAGNOSIS — Z89612 Acquired absence of left leg above knee: Secondary | ICD-10-CM | POA: Diagnosis not present

## 2018-09-11 ENCOUNTER — Other Ambulatory Visit: Payer: Self-pay

## 2018-09-11 ENCOUNTER — Encounter: Payer: Self-pay | Admitting: Family Medicine

## 2018-09-11 ENCOUNTER — Ambulatory Visit (INDEPENDENT_AMBULATORY_CARE_PROVIDER_SITE_OTHER): Payer: Medicare HMO | Admitting: Family Medicine

## 2018-09-11 ENCOUNTER — Telehealth: Payer: Self-pay | Admitting: Family Medicine

## 2018-09-11 VITALS — BP 115/92 | HR 87 | Temp 96.6°F

## 2018-09-11 DIAGNOSIS — J441 Chronic obstructive pulmonary disease with (acute) exacerbation: Secondary | ICD-10-CM | POA: Diagnosis not present

## 2018-09-11 DIAGNOSIS — E669 Obesity, unspecified: Secondary | ICD-10-CM

## 2018-09-11 DIAGNOSIS — E1122 Type 2 diabetes mellitus with diabetic chronic kidney disease: Secondary | ICD-10-CM | POA: Diagnosis not present

## 2018-09-11 DIAGNOSIS — R52 Pain, unspecified: Secondary | ICD-10-CM

## 2018-09-11 DIAGNOSIS — R05 Cough: Secondary | ICD-10-CM

## 2018-09-11 DIAGNOSIS — Z7189 Other specified counseling: Secondary | ICD-10-CM

## 2018-09-11 DIAGNOSIS — Z89612 Acquired absence of left leg above knee: Secondary | ICD-10-CM | POA: Diagnosis not present

## 2018-09-11 DIAGNOSIS — R059 Cough, unspecified: Secondary | ICD-10-CM

## 2018-09-11 DIAGNOSIS — N184 Chronic kidney disease, stage 4 (severe): Secondary | ICD-10-CM

## 2018-09-11 DIAGNOSIS — L98429 Non-pressure chronic ulcer of back with unspecified severity: Secondary | ICD-10-CM | POA: Diagnosis not present

## 2018-09-11 NOTE — Assessment & Plan Note (Signed)
Limiting her mobility

## 2018-09-11 NOTE — Assessment & Plan Note (Signed)
Encouraged hydration; avoid NSAIDs; follow-up with nephrologist

## 2018-09-11 NOTE — Assessment & Plan Note (Signed)
Sounds like it is well-controlled; weight loss encouraged

## 2018-09-11 NOTE — Telephone Encounter (Signed)
Patient sounds to have skin darkening, changes on her buttocks concerning for pressure from prolonged sitting You saw her in the fall Can you consult with them and recommend something for prevention of further progression

## 2018-09-11 NOTE — Assessment & Plan Note (Signed)
Message sent to Dr. Ashby Dawes about her symptoms; patient right now does not think she feels badly enough to warrant risking an ER trip; call 911 if getting worse; husband is attentive to keeping oxygen on her

## 2018-09-11 NOTE — Assessment & Plan Note (Signed)
Encouraged weight loss; watch calories in salad dressing

## 2018-09-11 NOTE — Progress Notes (Addendum)
BP (!) 115/92   Pulse 87   Temp (!) 96.6 F (35.9 C)   SpO2 94%    Subjective:    Patient ID: Amy [name], female    DOB: 10-17-1935, 83 y.o.   MRN: [id number]  HPI: Amy [name] is a 83 y.o. female  Chief Complaint  Patient presents with  . Hospitalization Follow-up    HPI Virtual Visit via Telephone/Video Note   I connected with the patient via:   I verified that I am speaking with the correct person using two identifiers.  Call started: 11:26 am Call terminated: 12:08 pm Total length of call: 42 minutes and 2 seconds   I discussed the limitations, risks, and privacy concerns of performing an evaluation and management service by telephone and the availability of in-person appointments. I explained that he/she may be responsible for charges related to this service. The patient expressed understanding and agreed to proceed.  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 and has referred to gastroenterology; the cancer doctor told her that the cancer part looked good, but she has this other thing; she wants GI to go down and look at her stomach; she has an appointment this Wednesday over the phone He will cough in spells, dry; not productive Not related to eating When she is transferring and doing extra activity, she will have coughing She has a pulmonologist; I asked if they called the pulmonologist about this cough and they said no (I urged them to call pulmonologist today and I sent message to him) She  feels short of breath at times She does not feel so short of breath that she needs to go to the ER she says, with husband on the phone Using oxygen; sometimes not even in her nose at times; lowest was 85% when she is without the supplement oxygen; goes back over 90% with oxygen; supplemental O2 is at two liters per minutes  Visit is listed as hospital follow-up However, she was admitted to the hospital back on July 25, 2018 and discharged on July 27, 2018 (today is September 11, 2018) That admission was for acute respiratory failure with hypoxia Husband checked her BP and other vitals this morning  Chest xray July 25, 2018: IMPRESSION: Improved right basilar opacity from prior exam with residual atelectasis or scarring. No new airspace disease.   Electronically Signed   By: Keith Rake M.D.   On: 07/25/2018 21:45  The diarrheal illness resolved  GFR was 28 in the hospital; Dr. Juleen China is her nephrologist; she is avoiding NSAIDs; not much water usually, just with medicines  Type 2 diabetes; last FSBS 134; no dry mouth or blurred vision; she is trying to behave herself with her eating; no problems with the right foot (s/p LEFT AKA)  She c/o of her butt hurting because she sits on the recliner all the time; sometimes in it from 7 am until 9 pm; using desinex on her bottom; 3x a day; just sitting there; has gel overlay and it's not working  for her; they already have a recliner that reclines and raises her up; she does not have any active sores; dark color changes but no ulceration  Fall Risk  09/11/2018 07/12/2018 06/21/2018 03/30/2018 03/28/2018  Falls in the past year? 0 1 0 1 -  Comment - - - - -  Number falls in past yr: - 1 - 1 1  Injury with Fall? - 1 - 0 0  Risk Factor Category  - - - - -  Risk for fall due to : - History of fall(s);Impaired balance/gait;Impaired mobility - - -  Follow up - - - - -    Relevant past medical, surgical, family and social history reviewed Past  Medical History:  Diagnosis Date  . Anemia    low iron  . Anxiety   . Arthritis   . Bilateral cataracts   . Bronchitis    hx of   . Cataract   . Chronic kidney disease   . Depression   . Diabetes mellitus without complication ([place])   . 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 GERD (gastroesophageal reflux disease)   . Headache   . History of frequent urinary tract infections   . Hx of renal cell cancer    LEFT  . Hyperlipidemia   . Hypertension   . Numbness and tingling    right hand   . Obesity   . Osteopenia 10/05/2017  . Peripheral artery disease ([place])   . Urinary frequency   . Urinary incontinence    Past Surgical History:  Procedure Laterality Date  . ABDOMINAL HYSTERECTOMY  1978  . CARPAL TUNNEL RELEASE Bilateral   . CHOLECYSTECTOMY    . COLONOSCOPY    . COLONOSCOPY WITH ESOPHAGOGASTRODUODENOSCOPY (EGD)    . ERCP N/A 08/19/2017   Procedure: ENDOSCOPIC RETROGRADE CHOLANGIOPANCREATOGRAPHY (ERCP);  Surgeon: Arta Silence, MD;  Location: [facility] ENDOSCOPY;  Service: Endoscopy;  Laterality: N/A;  . LEG AMPUTATION     above the knee / left   . ROBOT ASSISTED LAPAROSCOPIC NEPHRECTOMY Left 01/15/2015   Procedure: ROBOTIC ASSISTED LAPAROSCOPIC RADICAL NEPHRECTOMY;  Surgeon: Alexis Frock, MD;  Location: [facility] ORS;  Service: Urology;  Laterality: Left;  . stent placement right leg      Family History  Problem Relation Age of Onset  . Cancer Mother        Stomach  . Cancer Father   . Diabetes Brother   . Cancer Brother        oral  . Healthy Son   . Healthy Sister    Social History   Tobacco Use  . Smoking status: Former Smoker    Packs/day: 1.50    Years: 51.00    Pack years: 76.50    Types: Cigarettes    Last attempt to quit: 05/24/2004    Years since quitting: 14.3  . Smokeless tobacco: Never Used  . Tobacco comment: smoking cessation materials not required  Substance Use Topics  . Alcohol use: Not Currently  . Drug  use: No     [facility] from [facility]/11/[facility] in [facility]  AUDIT-C Score  0      Interim medical history since last visit reviewed. Allergies and medications reviewed  Review of Systems  Constitutional: Negative for chills and fever.   Per HPI unless specifically indicated above     Objective:    BP (!) 115/92   Pulse 87   Temp (!)  96.6 F (35.9 C)   SpO2 94%   Husband said battery on thermometer was low Wt Readings from Last 3 Encounters:  07/25/18 158 lb (71.7 kg)  07/17/18 158 lb (71.7 kg)  07/12/18 158 lb 12.8 oz (72 kg)    Physical Exam Pulmonary:     Effort: No respiratory distress.  Neurological:     Mental Status: She is alert.     Cranial Nerves: No dysarthria.     Comments: Oriented to name and date of birth  Psychiatric:        Speech: Speech is not rapid and pressured, delayed or slurred.     Results for orders placed or performed during the hospital encounter of 08/24/18  Glucose, capillary  Result Value Ref Range   Glucose-Capillary 86 70 - 99 mg/dL      Assessment & Plan:   Problem List Items Addressed This Visit      Respiratory   COPD exacerbation ([place])    Message sent to Dr. Ashby Dawes about her symptoms; patient right now does not think she feels badly enough to warrant risking an ER trip; call 911 if getting worse; husband is attentive to keeping oxygen on her        Endocrine   Diabetes mellitus type 2, controlled ([place])    Sounds like it is well-controlled; weight loss encouraged        Genitourinary   CKD (chronic kidney disease) stage 4, GFR 15-29 ml/min (HCC)    Encouraged hydration; avoid NSAIDs; follow-up with nephrologist        Other   S/P AKA (above knee amputation) unilateral, left ([place])    Limiting her mobility      Obesity (BMI 30.0-34.9)    Encouraged weight loss; watch calories in salad dressing       Other Visit Diagnoses    Cough    -  Primary   alert sent to Dr. Juanell Fairly that she has  had worsening dry cough; I asked husband to call pulmonologist today if they don't hear from them; to ER/911 if needed   Pain aggravated by sitting       shift weight; get up and move to prevent pressure ulcer; suggested gel overly, they have one and she just won't use it; offered reclining chair that lifts   Advice Given About Covid-19 Virus by Telephone       advice given; reasons to call 911 reviewed; hand washing, social isolation encouraged   Skin ulcer of sacrum, unspecified ulcer stage ([place])       note to Jeri Cos, PA-C at wound care center; ideally want to alleviate pressure to prevent further breakdown, ulceration       Follow up plan: Return in about 4 weeks (around 10/09/2018) for follow-up visit with Dr. Sanda Klein.   No orders of the defined types were placed in this encounter.   No orders of the defined types were placed in this encounter.

## 2018-09-13 DIAGNOSIS — N183 Chronic kidney disease, stage 3 (moderate): Secondary | ICD-10-CM | POA: Diagnosis not present

## 2018-09-13 DIAGNOSIS — Z7951 Long term (current) use of inhaled steroids: Secondary | ICD-10-CM | POA: Diagnosis not present

## 2018-09-13 DIAGNOSIS — E1122 Type 2 diabetes mellitus with diabetic chronic kidney disease: Secondary | ICD-10-CM | POA: Diagnosis not present

## 2018-09-13 DIAGNOSIS — J181 Lobar pneumonia, unspecified organism: Secondary | ICD-10-CM | POA: Diagnosis not present

## 2018-09-13 DIAGNOSIS — J440 Chronic obstructive pulmonary disease with acute lower respiratory infection: Secondary | ICD-10-CM | POA: Diagnosis not present

## 2018-09-13 DIAGNOSIS — J441 Chronic obstructive pulmonary disease with (acute) exacerbation: Secondary | ICD-10-CM | POA: Diagnosis not present

## 2018-09-13 DIAGNOSIS — Z89612 Acquired absence of left leg above knee: Secondary | ICD-10-CM | POA: Diagnosis not present

## 2018-09-13 DIAGNOSIS — F0390 Unspecified dementia without behavioral disturbance: Secondary | ICD-10-CM | POA: Diagnosis not present

## 2018-09-13 NOTE — Telephone Encounter (Signed)
They will give him the message, it was the weekend and he is off today, will be back tomorrow.

## 2018-09-13 NOTE — Telephone Encounter (Signed)
Please call wound care center I have not received a note back so perhaps provider is out Please ask them to have covering provider address

## 2018-09-14 ENCOUNTER — Telehealth: Payer: Self-pay

## 2018-09-14 ENCOUNTER — Other Ambulatory Visit (INDEPENDENT_AMBULATORY_CARE_PROVIDER_SITE_OTHER): Payer: Self-pay | Admitting: Vascular Surgery

## 2018-09-14 ENCOUNTER — Other Ambulatory Visit: Payer: Self-pay | Admitting: Family Medicine

## 2018-09-14 ENCOUNTER — Encounter: Payer: Self-pay | Admitting: Gastroenterology

## 2018-09-14 ENCOUNTER — Ambulatory Visit (INDEPENDENT_AMBULATORY_CARE_PROVIDER_SITE_OTHER): Payer: Medicare HMO | Admitting: Gastroenterology

## 2018-09-14 ENCOUNTER — Telehealth: Payer: Self-pay | Admitting: Gastroenterology

## 2018-09-14 DIAGNOSIS — R948 Abnormal results of function studies of other organs and systems: Secondary | ICD-10-CM

## 2018-09-14 DIAGNOSIS — I739 Peripheral vascular disease, unspecified: Secondary | ICD-10-CM

## 2018-09-14 MED ORDER — UNABLE TO FIND: 0 refills | Status: DC

## 2018-09-14 NOTE — Telephone Encounter (Signed)
Rx entered, please fax (If they require wet signature first before processing the paperwork, you can fax it to me and I'll fax back) Thank you

## 2018-09-14 NOTE — Progress Notes (Signed)
Amy Antigua, MD [address]  [place], [place] [address]  Main: [phone]  Fax: [phone]   Primary Care Physician: Arnetha Courser, MD  Virtual Visit via Telephone Note  I connected with patient on 09/14/18 at 10:00 AM EDT by telephone and verified that I am speaking with the correct person using two identifiers.   I discussed the limitations, risks, security and privacy concerns of performing an evaluation and management service by telephone and the availability of in person appointments. I also discussed with the patient that there may be a patient responsible charge related to this service. The patient expressed understanding and agreed to proceed.  Location of Patient: Home Location of Provider: Home Persons involved: Patient, patient's husband, and provider only   History of Present Illness: CC: Abnormal PET scan  HPI: Amy [name] is a 83 y.o. female with previous history of renal cell carcinoma, status post nephrectomy in 2016, being followed by Dr. Mike Gip, who had a recent PET scan which showed positive activity below the GE junction.  Therefore further evaluation has been requested for endoscopy to evaluate this area.  Patient denies any dysphagia, nausea vomiting, altered bowel habits, blood in stool, weight loss.  On review of provation records, patient had an EGD with Dr. Vira Agar in 2003 for abdominal pain.  Normal esophagus and duodenum were reported.  Gastritis and erythematous gastropathy was also reported on the report.  Last colonoscopy was by Dr. Vira Agar in May 2015.  1 diminutive transverse colon polyp was removed.  Recommendations were, no further colonoscopy needed, due to minimal findings and age.  However, on review of the pathology report, polyp showed tubular adenoma.  In addition, colonoscopy in 2012 had several polyps removed which showed tubular adenoma.  Therefore, per guidelines, polyp surveillance is recommended in  atleast 5 years from last colonoscopy.  Previous history: Patient was admitted to the hospital on August 13, 2017 with choledocholithiasis.  Has previous history of cholecystectomy.  She underwent ERCP with stone removal, and pancreatic duct stent placement by Dr. Paulita Fujita at [facility], as Dr. Allen Norris was out of town at the time.  She did well subsequently.  She had underwent abdominal x-ray on September 13, 2017, as per Dr. Erlinda Hong recommendation to ensure pancreatic stent is not there anymore.  See radiology report, that does not identify any stents in the exam.   Current Outpatient Medications  Medication Sig Dispense Refill   acetaminophen (TYLENOL) 325 MG tablet Take 2 tablets (650 mg total) by mouth every 6 (six) hours as needed for mild pain (or Fever >/= 101).     albuterol (PROVENTIL HFA;VENTOLIN HFA) 108 (90 Base) MCG/ACT inhaler Inhale 2 puffs into the lungs every 6 (six) hours as needed for wheezing or shortness of breath. 1 Inhaler 2   Ascorbic Acid (VITAMIN C) 1000 MG tablet Take 1,000 mg by mouth daily.     atorvastatin (LIPITOR) 20 MG tablet TAKE 1 TABLET (20 MG TOTAL) BY MOUTH AT BEDTIME. 90 tablet 0   clopidogrel (PLAVIX) 75 MG tablet Take 1 tablet (75 mg total) by mouth daily. 90 tablet 3   donepezil (ARICEPT) 10 MG tablet Take 1 tablet (10 mg total) by mouth at bedtime. 90 tablet 3   famotidine (PEPCID) 20 MG tablet Take 1 tablet (20 mg total) by mouth at bedtime. To help prevent heartburn, reflux 90 tablet 1   FLUoxetine (PROZAC) 10 MG capsule Take 1 capsule (10 mg total) by mouth daily.  30 capsule 0   gabapentin (NEURONTIN) 300 MG capsule Take 1 capsule (300 mg total) by mouth 3 (three) times daily. 270 capsule 3   ipratropium-albuterol (DUONEB) 0.5-2.5 (3) MG/3ML SOLN INHALE THE CONTENTS OF 1 VIAL VIA NEBULIZER THREE TIMES DAILY AS DIRECTED (Patient taking differently: Inhale 3 mLs into the lungs 3 (three) times daily. ) 810 mL 2   pantoprazole (PROTONIX) 40 MG tablet Take  1 tablet (40 mg total) by mouth daily. 90 tablet 0   QUEtiapine (SEROQUEL) 25 MG tablet Take 1 tablet (25 mg total) by mouth See admin instructions. Take 1 tablet in a.m. and 1 tablet in the afternoon and 2 tablets(50 mg total) before bedtime around 8 to 9 PM 150 tablet 0   Zinc Oxide (DESITIN RAPID RELIEF) 13 % CREA Apply 1 application topically 3 (three) times daily as needed. 1 Tube 0   No current facility-administered medications for this visit.     Allergies as of 09/14/2018 - Review Complete 09/14/2018  Allergen Reaction Noted   Fentanyl Other (See Comments) 11/05/2014   Penicillins Itching, Rash, and Other (See Comments) 11/05/2014    Review of Systems:    All systems reviewed and negative except where noted in HPI.   Observations/Objective:  Labs: CMP     Component Value Date/Time   NA 140 07/26/2018 0531   NA 138 01/06/2016 1006   NA 135 08/16/2014 0510   K 4.1 07/26/2018 0531   K 3.7 08/16/2014 0510   CL 103 07/26/2018 0531   CL 102 08/16/2014 0510   CO2 26 07/26/2018 0531   CO2 25 08/16/2014 0510   GLUCOSE 97 07/26/2018 0531   GLUCOSE 98 08/16/2014 0510   BUN 28 (H) 07/26/2018 0531   BUN 23 01/06/2016 1006   BUN 20 08/16/2014 0510   CREATININE 1.87 (H) 07/26/2018 0531   CREATININE 2.51 (H) 07/12/2018 1521   CALCIUM 8.2 (L) 07/26/2018 0531   CALCIUM 7.7 (L) 08/16/2014 0510   PROT 6.2 (L) 07/25/2018 2219   PROT 6.1 08/27/2015 1430   PROT 6.1 (L) 10/04/2012 1208   ALBUMIN 2.9 (L) 07/25/2018 2219   ALBUMIN 3.8 08/27/2015 1430   ALBUMIN 3.4 10/04/2012 1208   AST 34 07/25/2018 2219   AST 22 10/04/2012 1208   ALT 37 07/25/2018 2219   ALT 20 10/04/2012 1208   ALKPHOS 73 07/25/2018 2219   ALKPHOS 60 10/04/2012 1208   BILITOT 1.1 07/25/2018 2219   BILITOT 0.6 08/27/2015 1430   BILITOT 0.8 10/04/2012 1208   GFRNONAA 25 (L) 07/26/2018 0531   GFRNONAA 17 (L) 07/12/2018 1521   GFRAA 29 (L) 07/26/2018 0531   GFRAA 20 (L) 07/12/2018 1521   Lab Results    Component Value Date   WBC 13.2 (H) 07/26/2018   HGB 12.1 07/26/2018   HCT 37.8 07/26/2018   MCV 93.3 07/26/2018   PLT 226 07/26/2018    Imaging Studies: Nm Pet Image Initial (pi) Skull Base To Thigh  Result Date: 08/24/2018 CLINICAL DATA:  Initial treatment strategy for pulmonary nodule. EXAM: NUCLEAR MEDICINE PET SKULL BASE TO THIGH TECHNIQUE: 8.0 mCi F-18 FDG was injected intravenously. Full-ring PET imaging was performed from the skull base to thigh after the radiotracer. CT data was obtained and used for attenuation correction and anatomic localization. Fasting blood glucose: 86 mg/dl COMPARISON:  CT chest from 07/17/2018 FINDINGS: Mediastinal blood pool activity: SUV max 2.4 NECK: No significant abnormal hypermetabolic activity in this region. Incidental CT findings: Mild polypoid mucoperiosteal thickening in the  right maxillary sinus. CHEST: Right paratracheal node 1.0 cm in short axis on image 79/3 with maximum SUV 3.4. Lower right paratracheal lymph node at the level of the carina with a fatty hilum, measuring 1.3 cm in short axis on image 91/3, maximum SUV 2.9. Subtle accentuated right hilar activity, maximum SUV 3.9. Small right pleural effusion with mildly accentuated metabolic activity in the fusion, maximum SUV 2.4. There is soft tissue density around the right internal mammary vessels with faint calcification, ovoid density measuring 1.1 by 1.9 cm on image 79/3, maximum SUV 2.0. The subpleural nodularity inferiorly in the anterior right upper lobe measures 1.2 by 0.7 cm on image 113/3, stable, with maximum SUV of 1.5. A 0.8 cm left lower lobe pulmonary nodule on image 112/3 has a maximum SUV of 1.5. An irregular 1.0 by 0.8 cm nodule in the left upper lobe adjacent to the mediastinum on image 78/3 has maximum SUV of 1.5. There is some irregular airspace opacity posteriorly in the aerated portion of the right lower lobe adjacent to the pleural effusion has fairly low-grade activity with  maximum SUV of 2.3. On prior CT this was favored as being rounded atelectasis. A band of subpleural density in the superior segment right lower lobe measuring 2.2 by 0.8 cm is mostly sub solid on image 101/3, but has a maximum SUV of 3.1. Incidental CT findings: Coronary, aortic arch, and branch vessel atherosclerotic vascular disease. Centrilobular emphysema. ABDOMEN/PELVIS: Focally accentuated activity the stomach immediately below the gastroesophageal junction noted measuring 2.5 cm in long axis with maximum SUV of 16.2. Focally accentuated activity in a left upper quadrant loop of jejunum without discrete CT correlate. Incidental CT findings: Cholecystectomy. Left nephrectomy. Large photopenic cyst of the right kidney laterally. Aortoiliac atherosclerotic vascular disease. SKELETON: Accentuated activity in the left eighth rib laterally, maximum SUV 2.9, probably from a small healing nondisplaced rib fracture fractures not well seen. Tapered left femur, query below the knee amputation. Incidental CT findings: No supplemental non-categorized findings. IMPRESSION: 1. A 2.5 cm potential lesion in the stomach just below the GE junction has a maximum SUV of 16.2. Although occasionally physiologic activity can simulate a lesion, a gastric malignancy is not excluded particularly in light of the focal nature of this activity. I am aware that the patient had recent ERCP and have reviewed that report, but this lesion is in a location potentially difficult to see during the more limited gastric assessment of an ERCP. A full diagnostic upper endoscopy with special attention to the stomach just below the GE junction should be strongly considered to rule out gastric malignancy. 2. There is also some accentuated activity in left upper quadrant jejunal loop, but this is more likely to be artifactual given the lack of a CT correlate. 3. Findings in the chest are complex, with low-grade activity which could be  reactive/inflammatory or due to low-grade malignancy. There are a variety of mildly enlarged lymph nodes, some with activity mildly above the background blood pool. Most of the pulmonary nodules have low-grade activity meriting surveillance to exclude low-grade adenocarcinoma, but quite likely benign. The airspace opacity posteriorly in the aerated portion of the right lower lobe has low activity, probably reflecting rounded atelectasis. There is also a bandlike subpleural density in the superior segment right lower lobe which is mildly hypermetabolic with maximum SUV of 3.1, but with a sub solid appearance and contour atypical for malignancy. Overall I feel that the chest findings should be surveilled. 4. Small right pleural effusion has  accentuated activity suggesting possible exudative effusion. 5. Accentuated activity in the left eighth rib laterally with maximum SUV 2.9. I favor that this is probably from a small healing nondisplaced rib fracture which is otherwise occult. This area can be monitored along with the rest of the chest with follow up CT. 6. Other imaging findings of potential clinical significance: Aortic Atherosclerosis (ICD10-I70.0) and Emphysema (ICD10-J43.9). Left nephrectomy. Large right renal cyst. Mild chronic right maxillary sinusitis. Electronically Signed   By: Van Clines M.D.   On: 08/24/2018 14:45    Assessment and Plan:   Amy [name] is a 83 y.o. y/o female with history of renal cell carcinoma, nephrectomy in 2016, with positive PET scan below the GE junction  Assessment and Plan: Patient is otherwise asymptomatic from the viewpoint of a lesion below the GE junction. However, given her history of renal cell carcinoma, a positive finding on the PET scan indicates further evaluation to rule out mass in this area.  Her ERCP last year is a limited exam as it is not a frontal view exam and that exam by itself does not rule out any abnormalities of the GE  junction.  Patient is on Plavix and will need clearance prior to her procedure  We will potentially schedule for next week as an urgent procedure, as waiting more than 4 to 6 weeks for the COVID schedule limitation to lift, can potentially delay diagnosis and treatment in this case.  Risks of exposure in the setting of COVID pandemic were discussed with patient and family in detail and they would like to proceed with endoscopy as soon as possible  I have discussed alternative options, risks & benefits,  which include, but are not limited to, bleeding, infection, perforation,respiratory complication & drug reaction.  The patient agrees with this plan & written consent will be obtained.    Colonoscopy for polyp surveillance can be scheduled at a later time electively.  Follow Up Instructions: EGD in 1 to 2 weeks  Clinic follow-up in 3 to 4 weeks   I discussed the assessment and treatment plan with the patient. The patient was provided an opportunity to ask questions and all were answered. The patient agreed with the plan and demonstrated an understanding of the instructions.   The patient was advised to call back or seek an in-person evaluation if the symptoms worsen or if the condition fails to improve as anticipated.  I provided 15 minutes of non-face-to-face time during this encounter.   Virgel Manifold, MD  Speech recognition software was used to dictate this note.

## 2018-09-14 NOTE — Addendum Note (Signed)
Addended by: Earl Lagos on: 09/14/2018 12:33 PM   Modules accepted: Orders, SmartSet

## 2018-09-14 NOTE — Telephone Encounter (Signed)
error 

## 2018-09-14 NOTE — Telephone Encounter (Signed)
   [facility] Pre-operative Risk Assessment    Request for surgical clearance:  1. What type of surgery is being performed? EGD (URGENT)  2. When is this surgery scheduled? 09/21/2018  3. What type of clearance is required (medical clearance vs. Pharmacy clearance to hold med vs. Both)? PHARMACY  4. Are there any medications that need to be held prior to surgery and how long? PLAVIX 75 mg. Daily.  5. Practice name and name of physician performing surgery? Dr. Smitty Pluck, MD, [facility]  6. What is your office phone number [phone]   7.   What is your office fax number [phone]  8.   Anesthesia type (None, local, MAC, general) ? general   Martie Lee, LPN 7/51/0258, 52:77 PM  _________________________________________________________________   (provider comments below)

## 2018-09-14 NOTE — Telephone Encounter (Signed)
Husband said yes [facility] Fax [phone]

## 2018-09-14 NOTE — Telephone Encounter (Signed)
I spoke with pt's husband regarding her prep instructions for EGD scheduled 09/21/18 at [facility] I did tell him for her to hold her Plavix 5 days prior to procedure and restart the following day. I also informed him that Dr. Marius Ditch will be doing the procedure due to limiting MD's to exposure during the covid-19 virus.

## 2018-09-14 NOTE — Telephone Encounter (Signed)
Ask patient if she would like a hospital bed, one that could be adjustable I'll be glad to order if she'll use it

## 2018-09-14 NOTE — Telephone Encounter (Signed)
He said in note back to me that she should hold Plavix for 5 days prior to procedure and restart the following day. Husband is aware.

## 2018-09-14 NOTE — Telephone Encounter (Signed)
Provider at wound center called back states unable to document response in your note back to you?  But he did call patient and spoke with her and husband about different techniques.  Also states a hospital bed may benefit her because she lays in recliner all day and can not lay in bed because it hurts her back.

## 2018-09-15 ENCOUNTER — Other Ambulatory Visit: Payer: Self-pay | Admitting: Family Medicine

## 2018-09-15 MED ORDER — UNABLE TO FIND: 0 refills | Status: DC

## 2018-09-15 NOTE — Telephone Encounter (Signed)
I'll sign in person when I get there today; thank you

## 2018-09-15 NOTE — Progress Notes (Signed)
Printing again, giving to [facility] to fax

## 2018-09-15 NOTE — Telephone Encounter (Signed)
I will fax to you to get a signature

## 2018-09-18 ENCOUNTER — Telehealth: Payer: Self-pay

## 2018-09-18 NOTE — Telephone Encounter (Signed)
-----   Message from Laverle Hobby, MD sent at 09/18/2018 12:15 PM EDT ----- Regarding: Amy [name], can we set her up for a follow up?  ----- Message ----- From: Arnetha Courser, MD Sent: 09/11/2018  11:39 AM EDT To: Laverle Hobby, MD  Worsening cough over the last 2 weeks; no fevers; can you see her?

## 2018-09-18 NOTE — Telephone Encounter (Signed)
LM for patient to call to see if needs apt with Ram.

## 2018-09-19 ENCOUNTER — Encounter: Payer: Medicare HMO | Admitting: Student in an Organized Health Care Education/Training Program

## 2018-09-19 DIAGNOSIS — G301 Alzheimer's disease with late onset: Secondary | ICD-10-CM | POA: Diagnosis not present

## 2018-09-19 DIAGNOSIS — F411 Generalized anxiety disorder: Secondary | ICD-10-CM | POA: Diagnosis not present

## 2018-09-19 DIAGNOSIS — E782 Mixed hyperlipidemia: Secondary | ICD-10-CM | POA: Diagnosis not present

## 2018-09-19 DIAGNOSIS — Z8639 Personal history of other endocrine, nutritional and metabolic disease: Secondary | ICD-10-CM | POA: Diagnosis not present

## 2018-09-19 DIAGNOSIS — I208 Other forms of angina pectoris: Secondary | ICD-10-CM | POA: Diagnosis not present

## 2018-09-19 DIAGNOSIS — J449 Chronic obstructive pulmonary disease, unspecified: Secondary | ICD-10-CM | POA: Diagnosis not present

## 2018-09-19 DIAGNOSIS — R0602 Shortness of breath: Secondary | ICD-10-CM | POA: Diagnosis not present

## 2018-09-19 DIAGNOSIS — R011 Cardiac murmur, unspecified: Secondary | ICD-10-CM | POA: Diagnosis not present

## 2018-09-19 DIAGNOSIS — G4733 Obstructive sleep apnea (adult) (pediatric): Secondary | ICD-10-CM | POA: Diagnosis not present

## 2018-09-19 NOTE — Telephone Encounter (Signed)
Patient's husband called back and stated that patient has an apt to "go down the throat with a light" for this week, he thinks this will determine why she is having a cough. Did not want to schedule pulmonary apt at this time, will call back if they change their mind.

## 2018-09-21 ENCOUNTER — Ambulatory Visit: Payer: Medicare HMO | Admitting: Physician Assistant

## 2018-09-21 ENCOUNTER — Ambulatory Visit (INDEPENDENT_AMBULATORY_CARE_PROVIDER_SITE_OTHER): Payer: Medicare HMO | Admitting: Nurse Practitioner

## 2018-09-21 ENCOUNTER — Encounter: Payer: Self-pay | Admitting: Anesthesiology

## 2018-09-21 ENCOUNTER — Encounter: Payer: Self-pay | Admitting: Nurse Practitioner

## 2018-09-21 ENCOUNTER — Ambulatory Visit: Admission: RE | Admit: 2018-09-21 | Payer: Medicare HMO | Source: Home / Self Care | Admitting: Gastroenterology

## 2018-09-21 ENCOUNTER — Encounter: Admission: RE | Payer: Self-pay | Source: Home / Self Care

## 2018-09-21 ENCOUNTER — Other Ambulatory Visit: Payer: Self-pay

## 2018-09-21 DIAGNOSIS — R05 Cough: Secondary | ICD-10-CM

## 2018-09-21 DIAGNOSIS — R059 Cough, unspecified: Secondary | ICD-10-CM

## 2018-09-21 SURGERY — ESOPHAGOGASTRODUODENOSCOPY (EGD) WITH PROPOFOL
Anesthesia: General

## 2018-09-21 MED ORDER — BENZONATATE 100 MG PO CAPS: 100.0000 mg | ORAL_CAPSULE | Freq: Three times a day (TID) | ORAL | 0 refills | Status: DC | PRN

## 2018-09-21 NOTE — Progress Notes (Signed)
Virtual Visit via Telephone Note  I connected with August Luz on 09/21/18 at 10:40 AM EDT by telephone and verified that I am speaking with the correct person using two identifiers.   Staff discussed the limitations, risks, security and privacy concerns of performing an evaluation and management service by telephone and the availability of in person appointments. Staff also discussed with the patient that there may be a patient responsible charge related to this service. The patient expressed understanding and agreed to proceed.  Patients location: home My location: home office  Other people in meeting: rooney gladwin    HPI   Per husband patient saw Dr. Mike Gip for spot on her lung had PET scan that noted issues in her stomach so she was referred to GI for EGD. At pre-admit procedure they told husband due to her cough and rhinorrhea they would need COVID19 testing before she is able to get procedure. States has been having rhinorrhea for years and has told all her specialists but no one has done anything for it. She has had adry cough for over one month seen by her PCP on 4/20 for this- recommended he see Dr. Juanell Fairly; pulmonologist.     PHQ2/9: Depression screen [facility] 2/9 09/21/2018 07/12/2018 07/11/2018 03/28/2018 02/15/2018  Decreased Interest 0 0 1 0 0  Down, Depressed, Hopeless 1 1 1  0 0  PHQ - 2 Score 1 1 2  0 0  Altered sleeping 0 0 1 - 0  Tired, decreased energy 1 1 0 - 0  Change in appetite 0 0 0 - 0  Feeling bad or failure about yourself  0 0 0 - 0  Trouble concentrating 0 0 1 - 0  Moving slowly or fidgety/restless 0 0 0 - 0  Suicidal thoughts 0 0 0 - 0  PHQ-9 Score 2 2 4  - 0  Difficult doing work/chores Not difficult at all Not difficult at all Somewhat difficult - Not difficult at all  Some recent data might be hidden    PHQ reviewed. Negative  Patient Active Problem List   Diagnosis Date Noted  . Obesity (BMI 30.0-34.9) 09/11/2018  . Abnormal gastrointestinal PET scan  08/27/2018  . Pulmonary nodules 08/27/2018  . Goals of care, counseling/discussion   . Palliative care by specialist   . Hypoxia 07/26/2018  . HCAP (healthcare-associated pneumonia) 07/02/2018  . Hypoxemia   . COPD exacerbation ([place])   . Renal cell carcinoma ([place])   . Lung nodule 04/23/2018  . Hypertensive renal disease 11/08/2017  . Secondary hyperparathyroidism of renal origin ([place]) 11/08/2017  . QT prolongation 10/27/2017  . Osteopenia 10/05/2017  . Choledocholithiasis 08/13/2017  . Chronic pain syndrome 04/05/2017  . Depression 11/15/2016  . Iron deficiency anemia 09/08/2016  . Anemia 06/29/2016  . Late onset Alzheimer's disease with behavioral disturbance ([place]) 06/14/2016  . S/P AKA (above knee amputation) unilateral, left ([place]) 06/08/2016  . Wheelchair dependent 03/11/2016  . 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 ([place]) 03/13/2015  . S/p nephrectomy 01/29/2015  . Renal neoplasm 01/15/2015  . Abnormal EKG 01/02/2015  . Renal mass 12/05/2014  . GERD (gastroesophageal reflux disease) 11/20/2014  . GAD (generalized anxiety disorder) 11/20/2014  . AB (asthmatic bronchitis) 11/20/2014  . Aortic atherosclerosis ([place]) 11/20/2014  . Cataract 11/20/2014  . Leg pain 11/20/2014  . Continuous opioid dependence ([place]) 11/20/2014  . CKD (chronic kidney disease) stage 4, GFR 15-29 ml/min (HCC) 11/20/2014  . COPD (chronic obstructive pulmonary disease) ([place]) 11/20/2014  . Excessive falling 11/20/2014  .  Fatty infiltration of liver 11/20/2014  . Hyperlipidemia 11/20/2014  . Anterior knee pain 11/20/2014  . LBP (low back pain) 11/20/2014  . Malaise and fatigue 11/20/2014  . Abnormal presence of protein in urine 11/20/2014  . PVD (peripheral vascular disease) ([place]) 11/20/2014  . Allergic rhinitis 11/20/2014  . At risk for falling 11/20/2014  . Phantom limb syndrome with pain ([place]) 11/05/2014  . Anxiety 11/05/2014  . Elevated serum creatinine 11/05/2014  . Diabetes mellitus  type 2, controlled ([place]) 11/05/2014  . H/O adenomatous polyp of colon 07/27/2010    Past Medical History:  Diagnosis Date  . Anemia    low iron  . Anxiety   . Arthritis   . Bilateral cataracts   . Bronchitis    hx of   . Cataract   . Chronic kidney disease   . Depression   . Diabetes mellitus without complication ([place])   . 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 GERD (gastroesophageal reflux disease)   . Headache   . History of frequent urinary tract infections   . Hx of renal cell cancer    LEFT  . Hyperlipidemia   . Hypertension   . Numbness and tingling    right hand   . Obesity   . Osteopenia 10/05/2017  . Peripheral artery disease ([place])   . Urinary frequency   . Urinary incontinence     Past Surgical History:  Procedure Laterality Date  . ABDOMINAL HYSTERECTOMY  1978  . CARPAL TUNNEL RELEASE Bilateral   . CHOLECYSTECTOMY    . COLONOSCOPY    . COLONOSCOPY WITH ESOPHAGOGASTRODUODENOSCOPY (EGD)    . ERCP N/A 08/19/2017   Procedure: ENDOSCOPIC RETROGRADE CHOLANGIOPANCREATOGRAPHY (ERCP);  Surgeon: Arta Silence, MD;  Location: [facility] ENDOSCOPY;  Service: Endoscopy;  Laterality: N/A;  . LEG AMPUTATION     above the knee / left   . ROBOT ASSISTED LAPAROSCOPIC NEPHRECTOMY Left 01/15/2015   Procedure: ROBOTIC ASSISTED LAPAROSCOPIC RADICAL NEPHRECTOMY;  Surgeon: Alexis Frock, MD;  Location: [facility] ORS;  Service: Urology;  Laterality: Left;  . stent placement right leg       Social History   Tobacco Use  . Smoking status: Former Smoker    Packs/day: 1.50    Years: 51.00    Pack years: 76.50    Types: Cigarettes    Last attempt to quit: 05/24/2004    Years since quitting: 14.3  . Smokeless tobacco: Never Used  . Tobacco comment: smoking cessation materials not required  Substance Use Topics  . Alcohol use: Not Currently     Current Outpatient Medications:  .  acetaminophen (TYLENOL) 325 MG tablet, Take 2 tablets (650 mg  total) by mouth every 6 (six) hours as needed for mild pain (or Fever >/= 101)., Disp: , Rfl:  .  albuterol (PROVENTIL HFA;VENTOLIN HFA) 108 (90 Base) MCG/ACT inhaler, Inhale 2 puffs into the lungs every 6 (six) hours as needed for wheezing or shortness of breath., Disp: 1 Inhaler, Rfl: 2 .  Ascorbic Acid (VITAMIN C) 1000 MG tablet, Take 1,000 mg by mouth daily., Disp: , Rfl:  .  atorvastatin (LIPITOR) 20 MG tablet, TAKE 1 TABLET (20 MG TOTAL) BY MOUTH AT BEDTIME., Disp: 90 tablet, Rfl: 0 .  clopidogrel (PLAVIX) 75 MG tablet, TAKE 1 TABLET EVERY DAY, Disp: 90 tablet, Rfl: 3 .  donepezil (ARICEPT) 10 MG tablet, Take 1 tablet (10 mg total) by mouth at bedtime., Disp: 90  tablet, Rfl: 3 .  famotidine (PEPCID) 20 MG tablet, TAKE 1 TABLET AT BEDTIME. TO HELP PREVENT HEARTBURN, REFLUX, Disp: 90 tablet, Rfl: 1 .  FLUoxetine (PROZAC) 10 MG capsule, Take 1 capsule (10 mg total) by mouth daily., Disp: 30 capsule, Rfl: 0 .  gabapentin (NEURONTIN) 300 MG capsule, Take 1 capsule (300 mg total) by mouth 3 (three) times daily., Disp: 270 capsule, Rfl: 3 .  ipratropium-albuterol (DUONEB) 0.5-2.5 (3) MG/3ML SOLN, INHALE THE CONTENTS OF 1 VIAL VIA NEBULIZER THREE TIMES DAILY AS DIRECTED (Patient taking differently: Inhale 3 mLs into the lungs 3 (three) times daily. ), Disp: 810 mL, Rfl: 2 .  pantoprazole (PROTONIX) 40 MG tablet, Take 1 tablet (40 mg total) by mouth daily., Disp: 90 tablet, Rfl: 0 .  QUEtiapine (SEROQUEL) 25 MG tablet, Take 1 tablet (25 mg total) by mouth See admin instructions. Take 1 tablet in a.m. and 1 tablet in the afternoon and 2 tablets(50 mg total) before bedtime around 8 to 9 PM, Disp: 150 tablet, Rfl: 0 .  UNABLE TO FIND, HOSPITAL BED, adjustable head and feet sections please; dx: hx sacral decubitus ulcer, status post LEFT above the knee leg amputation, LON 99 months, Disp: 1 each, Rfl: 0 .  Zinc Oxide (DESITIN RAPID RELIEF) 13 % CREA, Apply 1 application topically 3 (three) times daily as  needed., Disp: 1 Tube, Rfl: 0  Allergies  Allergen Reactions  . Fentanyl Other (See Comments)    urine retention  . Penicillins Itching, Rash and Other (See Comments)    Has patient had a PCN reaction causing immediate rash, facial/tongue/throat swelling, SOB or lightheadedness with hypotension: Yes Has patient had a PCN reaction causing severe rash involving mucus membranes or skin necrosis: No Has patient had a PCN reaction that required hospitalization: No Has patient had a PCN reaction occurring within the last 10 years: Yes If all of the above answers are "NO", then may proceed with Cephalosporin use.    ROS    No other specific complaints in a complete review of systems (except as listed in HPI above).  Objective  There were no vitals filed for this visit.   There is no height or weight on file to calculate BMI.   Assessment & Plan  1. Cough We do not have COVID19 testing privileges in primary care at this time. Caregiver made aware, per Dr. Delight Ovens recommendation patient was to follow-up with pulmonologist. He will call them to make them aware of situation prior to making appointment. - benzonatate (TESSALON PERLES) 100 MG capsule; Take 1 capsule (100 mg total) by mouth 3 (three) times daily as needed for cough.  Dispense: 20 capsule; Refill: 0  I discussed the assessment and treatment plan with the patient. The patient was provided an opportunity to ask questions and all were answered. The patient agreed with the plan and demonstrated an understanding of the instructions.   The patient was advised to call back or seek an in-person evaluation if the symptoms worsen or if the condition fails to improve as anticipated.  I provided 15 minutes of non-face-to-face time during this encounter.   Fredderick Severance, NP   Attempted to call patient to provide [facility] triage line for COVID19 testing without response .Will continue to reach out.

## 2018-09-25 ENCOUNTER — Ambulatory Visit: Payer: Self-pay | Admitting: Family Medicine

## 2018-09-25 ENCOUNTER — Other Ambulatory Visit: Payer: Self-pay | Admitting: Gastroenterology

## 2018-09-25 ENCOUNTER — Telehealth: Payer: Self-pay | Admitting: Gastroenterology

## 2018-09-25 DIAGNOSIS — R05 Cough: Secondary | ICD-10-CM

## 2018-09-25 DIAGNOSIS — R059 Cough, unspecified: Secondary | ICD-10-CM

## 2018-09-25 NOTE — Telephone Encounter (Signed)
Husband, Gabriana Wilmott called in for his wife. She is scheduled to have an endoscopy however because she has a runny nose which she has had for 2-3 years now with clear drainage per husband and a slight cough they want her tested for the COVID-19 virus before she can have the endoscopy done. He has called the lung doctor, talked with the GI doctor and has been getting the run around.  Now they say she has to talk with her PCP. I called the office and spoke with [facility] and she requested I send a note over and they will have Dr. Sanda Klein call him back.  I sent these notes over for Dr. Sanda Klein for further disposition.  The husband was agreeable to this plan.

## 2018-09-25 NOTE — Telephone Encounter (Signed)
I tried to contact this patient to review the message but there was no answer and I was not able to leave a message.

## 2018-09-25 NOTE — Telephone Encounter (Signed)
House sitter answered phone and was asked to take a message because husband out in yard or call back at 2pm.  I asked her to relay the message that her PCP would need to do the testing for the covid-19 virus. Our office is not open, staff is working from home.

## 2018-09-25 NOTE — Telephone Encounter (Signed)
Pt husband is calling pt was supposed to do a EGD but they turned her away because her nose was runny and had a dry couch. He was told she needs to get tested for Covid19 so we can reschedule this procedure , he has been getting the run around about who will order the test her PCP told him we need to order it as well as  Dr. Eual Fines . Please call pt husband

## 2018-09-25 NOTE — Progress Notes (Signed)
I spoke to the command center at [phone] and discussed the need for COVID testing for this pt. She was scheduled for an EGD last week with one of my partners, Dr. Marius Ditch, but had complained of a dry cough for 1 month and so the procedure was cancelled and pt was asked to follow up with PCP to get COVID testing.  Pt has been unable to get this testing done at PCP office and her EGD needs to be scheduled due to a positive PET scan.   The command center states I can order the COVID testing and it can be done at [facility]. I spoke with the lab and they are able to see the order and state they will change it to the in house version if needed when the patient gets here.  I spoke to the pt and patient's husband [name] who takes care of the pt, and they state they will go to [facility] likely tomorrow to get this done.

## 2018-09-25 NOTE — Telephone Encounter (Signed)
Patients husband stated he would call pulmonologist office as we are unable to set up testing with [facility] at this time. He can additionally call [facility]'s COVID-19 hotline at [phone] to determine if they are eligible for testing, as they do currently have supplies and can test eligible patients.

## 2018-09-26 ENCOUNTER — Telehealth: Payer: Self-pay | Admitting: Gastroenterology

## 2018-09-26 ENCOUNTER — Encounter: Payer: Self-pay | Admitting: Gastroenterology

## 2018-09-26 NOTE — Progress Notes (Signed)
I talked to Dr. Ileene Patrick from the Command center who is working on determining where at [facility] pt can get her COVID testing done. I explained to him the need for the test preoperatively before her EGD, which is being done for a positive PET scan. He is speaking to [facility] in this regard and will get back to me once they have an answer this week.  Pt reports chronic rhinorrhea for 3 yrs and a dry cough for about a year. No fever. W hen she came to [facility] last week for her EGD she was asked to go home due to her symptoms and follow up with her PCP. Her PCP has not been to get COVID testing done.   She has also been referred to pulmonologist by her PCP due to her chronic symptoms.

## 2018-09-26 NOTE — Telephone Encounter (Signed)
I have tried to call Mr. Amy [name] x2, receiving his voice mail each time. The command center states pt will have to go to the ED, not to the lab. I tried again and talked with Mr. Amy [name] and he is very frustrated, understandably. He will take pt to the ED and wanted to know if they were going to do the test if he went and I replied that would be the ED physician's call, depending on pt's symtoms  He said the pulmonaligist wanted to do it over the phone and he said that was useless.  Dr. Bonna [name] has informed me that pt does not need to go to the ED, that we are going to try another way.

## 2018-09-26 NOTE — Telephone Encounter (Signed)
Medical facility called to get clarification on prev. Messages pt is supposed to go for Covid 19 testing  And is unsure of where to go

## 2018-09-26 NOTE — Telephone Encounter (Signed)
Miel with [facility] called (patient's pcp office) they do not do the Covid-19 testing & referred patient to [name]. The patient's husband called there & per the CDC recommendation the patient does not qualify for the test. The patient's husband is frustrated.

## 2018-09-26 NOTE — Telephone Encounter (Signed)
No answer

## 2018-09-26 NOTE — Progress Notes (Signed)
Pt was notified of the ongoing conversation with the command center to get the testing done as an outpatient instead of having to go to the ED. They verbalized understanding.

## 2018-09-26 NOTE — Telephone Encounter (Signed)
I have spoke with husband. See telephone encounter.

## 2018-09-27 ENCOUNTER — Telehealth: Payer: Self-pay | Admitting: Nurse Practitioner

## 2018-09-27 DIAGNOSIS — N183 Chronic kidney disease, stage 3 (moderate): Secondary | ICD-10-CM | POA: Diagnosis not present

## 2018-09-27 DIAGNOSIS — F0390 Unspecified dementia without behavioral disturbance: Secondary | ICD-10-CM | POA: Diagnosis not present

## 2018-09-27 DIAGNOSIS — Z89612 Acquired absence of left leg above knee: Secondary | ICD-10-CM | POA: Diagnosis not present

## 2018-09-27 DIAGNOSIS — Z7951 Long term (current) use of inhaled steroids: Secondary | ICD-10-CM | POA: Diagnosis not present

## 2018-09-27 DIAGNOSIS — J440 Chronic obstructive pulmonary disease with acute lower respiratory infection: Secondary | ICD-10-CM | POA: Diagnosis not present

## 2018-09-27 DIAGNOSIS — E1122 Type 2 diabetes mellitus with diabetic chronic kidney disease: Secondary | ICD-10-CM | POA: Diagnosis not present

## 2018-09-27 DIAGNOSIS — J441 Chronic obstructive pulmonary disease with (acute) exacerbation: Secondary | ICD-10-CM | POA: Diagnosis not present

## 2018-09-27 DIAGNOSIS — J181 Lobar pneumonia, unspecified organism: Secondary | ICD-10-CM | POA: Diagnosis not present

## 2018-09-27 NOTE — Telephone Encounter (Signed)
I have spoken to pt's husband and informed him that Dr. Bonna Gains and [facility] working on getting this done and will contact him as soon as she knows something. She cannot order the test through [facility]. Husband voiced understanding and is appreciative.

## 2018-09-27 NOTE — Telephone Encounter (Signed)
Do you have any other ideas how she can get COVID19 testing?

## 2018-09-27 NOTE — Telephone Encounter (Signed)
Pt is calling he was told by his brothers wife who is a [employer] scientist that [facility] would do the COVID19 test all he would have to do is bring her up there, he would like for Dr. Bonna Gains to contact [facility] and update them of why we need this test done so he does not bring wife up there for no reason please return pt's call regarding this.

## 2018-09-29 ENCOUNTER — Telehealth: Payer: Self-pay | Admitting: Gastroenterology

## 2018-09-29 ENCOUNTER — Other Ambulatory Visit: Payer: Self-pay | Admitting: Gastroenterology

## 2018-09-29 NOTE — Telephone Encounter (Signed)
Patient's procedure is scheduled for May 19th. I spoke with Kieth Brightly - charge at the Endo unit and she states pt will need to go for COVID testing on May 15th to [facility] between 10:30 am and 12:30 pm. I have placed the order for pre-procedure COVID testing as instructed in the recent emails sent for this testing. I have confirmed with Kieth Brightly that this is the way to order the test. I spoke with pt and husband and instructed them about where to go on May 15th and of the procedure on May 19th. Will have my clinic call them next week as well.

## 2018-09-29 NOTE — Telephone Encounter (Signed)
Addendum to previous message: procedure will now be on Thursday May 21st, and pt to go for COVID testing May 18th. Pt and husband informed of the change.

## 2018-09-30 DIAGNOSIS — J449 Chronic obstructive pulmonary disease, unspecified: Secondary | ICD-10-CM | POA: Diagnosis not present

## 2018-10-01 DIAGNOSIS — J9601 Acute respiratory failure with hypoxia: Secondary | ICD-10-CM | POA: Diagnosis not present

## 2018-10-02 ENCOUNTER — Telehealth: Payer: Self-pay

## 2018-10-02 NOTE — Telephone Encounter (Signed)
Pt's husband [name]) notified of covid-19 testing prior to procedure on 5/18, drive through at the [place] center. He states someone had already told him. Also given prep instructions prior to procedure, including holding blood thinner. EGD is scheduled for 10/12/18.

## 2018-10-02 NOTE — Telephone Encounter (Signed)
-----   Message from Virgel Manifold, MD sent at 09/29/2018  1:12 PM EDT ----- Correction - her EGD will be on May 21st and COVID testing will be May 18th. Please call the pt next week to make sure he has all the details he needs. Thank you so much!

## 2018-10-05 ENCOUNTER — Ambulatory Visit: Payer: Medicare HMO

## 2018-10-09 ENCOUNTER — Other Ambulatory Visit
Admission: RE | Admit: 2018-10-09 | Discharge: 2018-10-09 | Disposition: A | Discharge status: Home / Self Care | Payer: Medicare HMO | Source: Ambulatory Visit | Attending: Gastroenterology | Admitting: Gastroenterology

## 2018-10-09 ENCOUNTER — Other Ambulatory Visit: Payer: Self-pay

## 2018-10-09 ENCOUNTER — Ambulatory Visit: Payer: Medicare HMO | Admitting: Nurse Practitioner

## 2018-10-09 ENCOUNTER — Telehealth: Payer: Self-pay | Admitting: Primary Care

## 2018-10-09 DIAGNOSIS — Z1159 Encounter for screening for other viral diseases: Secondary | ICD-10-CM | POA: Diagnosis not present

## 2018-10-09 DIAGNOSIS — Z01812 Encounter for preprocedural laboratory examination: Secondary | ICD-10-CM | POA: Diagnosis not present

## 2018-10-09 NOTE — Telephone Encounter (Signed)
T/c to schedule palliative visit. Husband states he wants to wait until it can be in person as they have no smart device. States pt has a procedure next week and to call back at the end of the month.

## 2018-10-10 ENCOUNTER — Other Ambulatory Visit: Payer: Self-pay

## 2018-10-10 ENCOUNTER — Encounter: Payer: Self-pay | Admitting: Psychiatry

## 2018-10-10 ENCOUNTER — Encounter (INDEPENDENT_AMBULATORY_CARE_PROVIDER_SITE_OTHER): Payer: Medicare HMO | Admitting: Psychiatry

## 2018-10-10 LAB — NOVEL CORONAVIRUS, NAA (HOSP ORDER, SEND-OUT TO REF LAB; TAT 18-24 HRS): SARS-CoV-2, NAA: NOT DETECTED

## 2018-10-11 ENCOUNTER — Encounter: Payer: Self-pay | Admitting: *Deleted

## 2018-10-12 ENCOUNTER — Ambulatory Visit: Payer: Medicare HMO | Admitting: Anesthesiology

## 2018-10-12 ENCOUNTER — Encounter: Payer: Self-pay | Admitting: *Deleted

## 2018-10-12 ENCOUNTER — Encounter
Admission: RE | Disposition: A | Discharge status: Home / Self Care | Payer: Self-pay | Source: Home / Self Care | Attending: Gastroenterology

## 2018-10-12 ENCOUNTER — Ambulatory Visit
Admission: RE | Admit: 2018-10-12 | Discharge: 2018-10-12 | Disposition: A | Discharge status: Home / Self Care | Payer: Medicare HMO | Attending: Gastroenterology | Admitting: Gastroenterology

## 2018-10-12 DIAGNOSIS — R933 Abnormal findings on diagnostic imaging of other parts of digestive tract: Secondary | ICD-10-CM

## 2018-10-12 DIAGNOSIS — E785 Hyperlipidemia, unspecified: Secondary | ICD-10-CM | POA: Diagnosis not present

## 2018-10-12 DIAGNOSIS — E1151 Type 2 diabetes mellitus with diabetic peripheral angiopathy without gangrene: Secondary | ICD-10-CM | POA: Diagnosis not present

## 2018-10-12 DIAGNOSIS — Z86718 Personal history of other venous thrombosis and embolism: Secondary | ICD-10-CM | POA: Insufficient documentation

## 2018-10-12 DIAGNOSIS — C160 Malignant neoplasm of cardia: Secondary | ICD-10-CM | POA: Diagnosis not present

## 2018-10-12 DIAGNOSIS — Z905 Acquired absence of kidney: Secondary | ICD-10-CM | POA: Diagnosis not present

## 2018-10-12 DIAGNOSIS — N183 Chronic kidney disease, stage 3 (moderate): Secondary | ICD-10-CM | POA: Diagnosis not present

## 2018-10-12 DIAGNOSIS — J441 Chronic obstructive pulmonary disease with (acute) exacerbation: Secondary | ICD-10-CM | POA: Diagnosis not present

## 2018-10-12 DIAGNOSIS — J181 Lobar pneumonia, unspecified organism: Secondary | ICD-10-CM | POA: Diagnosis not present

## 2018-10-12 DIAGNOSIS — K2950 Unspecified chronic gastritis without bleeding: Secondary | ICD-10-CM | POA: Diagnosis not present

## 2018-10-12 DIAGNOSIS — Z89612 Acquired absence of left leg above knee: Secondary | ICD-10-CM | POA: Insufficient documentation

## 2018-10-12 DIAGNOSIS — N189 Chronic kidney disease, unspecified: Secondary | ICD-10-CM | POA: Diagnosis not present

## 2018-10-12 DIAGNOSIS — Z79899 Other long term (current) drug therapy: Secondary | ICD-10-CM | POA: Insufficient documentation

## 2018-10-12 DIAGNOSIS — Z87891 Personal history of nicotine dependence: Secondary | ICD-10-CM | POA: Diagnosis not present

## 2018-10-12 DIAGNOSIS — D490 Neoplasm of unspecified behavior of digestive system: Secondary | ICD-10-CM

## 2018-10-12 DIAGNOSIS — E1122 Type 2 diabetes mellitus with diabetic chronic kidney disease: Secondary | ICD-10-CM | POA: Diagnosis not present

## 2018-10-12 DIAGNOSIS — J440 Chronic obstructive pulmonary disease with acute lower respiratory infection: Secondary | ICD-10-CM | POA: Diagnosis not present

## 2018-10-12 DIAGNOSIS — Z7951 Long term (current) use of inhaled steroids: Secondary | ICD-10-CM | POA: Diagnosis not present

## 2018-10-12 DIAGNOSIS — Z9981 Dependence on supplemental oxygen: Secondary | ICD-10-CM | POA: Diagnosis not present

## 2018-10-12 DIAGNOSIS — F0390 Unspecified dementia without behavioral disturbance: Secondary | ICD-10-CM | POA: Diagnosis not present

## 2018-10-12 DIAGNOSIS — K3189 Other diseases of stomach and duodenum: Secondary | ICD-10-CM

## 2018-10-12 DIAGNOSIS — Z85528 Personal history of other malignant neoplasm of kidney: Secondary | ICD-10-CM | POA: Insufficient documentation

## 2018-10-12 DIAGNOSIS — K219 Gastro-esophageal reflux disease without esophagitis: Secondary | ICD-10-CM | POA: Insufficient documentation

## 2018-10-12 DIAGNOSIS — Z7902 Long term (current) use of antithrombotics/antiplatelets: Secondary | ICD-10-CM | POA: Insufficient documentation

## 2018-10-12 DIAGNOSIS — I129 Hypertensive chronic kidney disease with stage 1 through stage 4 chronic kidney disease, or unspecified chronic kidney disease: Secondary | ICD-10-CM | POA: Diagnosis not present

## 2018-10-12 DIAGNOSIS — C169 Malignant neoplasm of stomach, unspecified: Secondary | ICD-10-CM

## 2018-10-12 DIAGNOSIS — N184 Chronic kidney disease, stage 4 (severe): Secondary | ICD-10-CM | POA: Diagnosis not present

## 2018-10-12 DIAGNOSIS — F329 Major depressive disorder, single episode, unspecified: Secondary | ICD-10-CM | POA: Insufficient documentation

## 2018-10-12 DIAGNOSIS — J449 Chronic obstructive pulmonary disease, unspecified: Secondary | ICD-10-CM | POA: Diagnosis not present

## 2018-10-12 HISTORY — PX: ESOPHAGOGASTRODUODENOSCOPY (EGD) WITH PROPOFOL: SHX5813

## 2018-10-12 LAB — GLUCOSE, CAPILLARY: Glucose-Capillary: 98 mg/dL (ref 70–99)

## 2018-10-12 SURGERY — ESOPHAGOGASTRODUODENOSCOPY (EGD) WITH PROPOFOL
Anesthesia: General

## 2018-10-12 MED ORDER — LIDOCAINE HCL (CARDIAC) PF 100 MG/5ML IV SOSY
PREFILLED_SYRINGE | INTRAVENOUS | Status: DC | PRN
  Administered 2018-10-12: 50 mg via INTRAVENOUS

## 2018-10-12 MED ORDER — PHENYLEPHRINE HCL (PRESSORS) 10 MG/ML IV SOLN
INTRAVENOUS | Status: DC | PRN
  Administered 2018-10-12 (×2): 100 ug via INTRAVENOUS

## 2018-10-12 MED ORDER — PROPOFOL 10 MG/ML IV BOLUS
INTRAVENOUS | Status: DC | PRN
  Administered 2018-10-12: 30 mg via INTRAVENOUS

## 2018-10-12 MED ORDER — PROPOFOL 500 MG/50ML IV EMUL
INTRAVENOUS | Status: DC | PRN
  Administered 2018-10-12: 150 ug/kg/min via INTRAVENOUS

## 2018-10-12 MED ORDER — PROPOFOL 500 MG/50ML IV EMUL
INTRAVENOUS | Status: AC
  Filled 2018-10-12: qty 50

## 2018-10-12 MED ORDER — SODIUM CHLORIDE 0.9 % IV SOLN
INTRAVENOUS | Status: DC
  Administered 2018-10-12 (×2): via INTRAVENOUS

## 2018-10-12 MED ORDER — LIDOCAINE HCL (PF) 2 % IJ SOLN
INTRAMUSCULAR | Status: AC
  Filled 2018-10-12: qty 10

## 2018-10-12 NOTE — H&P (Signed)
[name], MD [address], [place], [place], [place], [address] [address], [place], [place], [place], [address] Phone: [phone]  Fax: [phone]  Primary Care Physician:  Arnetha Courser, MD   Pre-Procedure History & Physical: HPI:  Amy [name] is a 83 y.o. female is here for an EGD.   Past Medical History:  Diagnosis Date  . Anemia    low iron  . Anxiety   . Arthritis   . Bilateral cataracts   . Bronchitis    hx of   . Cataract   . Chronic kidney disease   . Depression   . Diabetes mellitus without complication ([facility])   . 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 GERD (gastroesophageal reflux disease)   . Headache   . History of frequent urinary tract infections   . Hx of renal cell cancer    LEFT  . Hyperlipidemia   . Hypertension   . Numbness and tingling    right hand   . Obesity   . Osteopenia 10/05/2017  . Peripheral artery disease ([facility])   . Urinary frequency   . Urinary incontinence     Past Surgical History:  Procedure Laterality Date  . ABDOMINAL HYSTERECTOMY  1978  . CARPAL TUNNEL RELEASE Bilateral   . CHOLECYSTECTOMY    . COLONOSCOPY    . COLONOSCOPY WITH ESOPHAGOGASTRODUODENOSCOPY (EGD)    . ERCP N/A 08/19/2017   Procedure: ENDOSCOPIC RETROGRADE CHOLANGIOPANCREATOGRAPHY (ERCP);  Surgeon: Arta Silence, MD;  Location: [facility] ENDOSCOPY;  Service: Endoscopy;  Laterality: N/A;  . LEG AMPUTATION     above the knee / left   . ROBOT ASSISTED LAPAROSCOPIC NEPHRECTOMY Left 01/15/2015   Procedure: ROBOTIC ASSISTED LAPAROSCOPIC RADICAL NEPHRECTOMY;  Surgeon: Alexis Frock, MD;  Location: [facility] ORS;  Service: Urology;  Laterality: Left;  . stent placement right leg       Prior to Admission medications   Medication Sig Start Date End Date Taking? Authorizing Provider  donepezil (ARICEPT) 10 MG tablet Take 1 tablet (10 mg total) by mouth at bedtime. 10/06/17  Yes Lada, Satira Anis, MD   gabapentin (NEURONTIN) 300 MG capsule Take 1 capsule (300 mg total) by mouth 3 (three) times daily. 06/21/18  Yes Gillis Santa, MD  HYDROcodone-acetaminophen (NORCO/VICODIN) 5-325 MG tablet Take 1 tablet by mouth every 6 (six) hours as needed for moderate pain.   Yes [provider]  ipratropium-albuterol (DUONEB) 0.5-2.5 (3) MG/3ML SOLN INHALE THE CONTENTS OF 1 VIAL VIA NEBULIZER THREE TIMES DAILY AS DIRECTED Patient taking differently: Inhale 3 mLs into the lungs 3 (three) times daily.  11/10/17  Yes Laverle Hobby, MD  pantoprazole (PROTONIX) 40 MG tablet Take 1 tablet (40 mg total) by mouth daily. 08/24/18  Yes Lada, Satira Anis, MD  QUEtiapine (SEROQUEL) 25 MG tablet Take 1 tablet (25 mg total) by mouth See admin instructions. Take 1 tablet in a.m. and 1 tablet in the afternoon and 2 tablets(50 mg total) before bedtime around 8 to 9 PM 07/27/18  Yes Vaughan Basta, MD  acetaminophen (TYLENOL) 325 MG tablet Take 2 tablets (650 mg total) by mouth every 6 (six) hours as needed for mild pain (or Fever >/= 101). 07/21/18   Gouru, Aruna, MD  Ascorbic Acid (VITAMIN C) 1000 MG tablet Take 1,000 mg by mouth daily.    [provider]  atorvastatin (LIPITOR) 20 MG tablet TAKE 1 TABLET (20 MG TOTAL) BY MOUTH  AT BEDTIME. 07/12/18   Lada, Satira Anis, MD  benzonatate (TESSALON PERLES) 100 MG capsule Take 1 capsule (100 mg total) by mouth 3 (three) times daily as needed for cough. 09/21/18   Poulose, Bethel Born, NP  clopidogrel (PLAVIX) 75 MG tablet TAKE 1 TABLET EVERY DAY 09/14/18   Algernon Huxley, MD  FLUoxetine (PROZAC) 10 MG capsule Take 1 capsule (10 mg total) by mouth daily. 07/22/18   Nicholes Mango, MD  UNABLE TO [facility] BED, adjustable head and feet sections please; dx: hx sacral decubitus ulcer, status post LEFT above the knee leg amputation, LON 99 months 09/15/18   Arnetha Courser, MD  Zinc Oxide (DESITIN RAPID RELIEF) 13 % CREA Apply 1 application topically 3 (three) times daily  as needed. 04/25/18   Gregor Hams, MD    Allergies as of 09/29/2018 - Review Complete 09/21/2018  Allergen Reaction Noted  . Fentanyl Other (See Comments) 11/05/2014  . Penicillins Itching, Rash, and Other (See Comments) 11/05/2014    Family History  Problem Relation Age of Onset  . Cancer Mother        Stomach  . Cancer Father   . Diabetes Brother   . Cancer Brother        oral  . Healthy Son   . Healthy Sister     Social History   Socioeconomic History  . Marital status: Married    Spouse name: [name]  . Number of children: 1  . Years of education: Not on file  . Highest education level: 10th grade  Occupational History  . Occupation: Retired  Scientific laboratory technician  . Financial resource strain: Not hard at all  . Food insecurity:    Worry: Never true    Inability: Never true  . Transportation needs:    Medical: No    Non-medical: No  Tobacco Use  . Smoking status: Former Smoker    Packs/day: 1.50    Years: 51.00    Pack years: 76.50    Types: Cigarettes    Last attempt to quit: 05/24/2004    Years since quitting: 14.3  . Smokeless tobacco: Never Used  . Tobacco comment: smoking cessation materials not required  Substance and Sexual Activity  . Alcohol use: Not Currently  . Drug use: No  . Sexual activity: Not Currently  Lifestyle  . Physical activity:    Days per week: 0 days    Minutes per session: 0 min  . Stress: Very much  Relationships  . Social connections:    Talks on phone: Patient refused    Gets together: Patient refused    Attends religious service: Never    Active member of club or organization: No    Attends meetings of clubs or organizations: Never    Relationship status: Married  . Intimate partner violence:    Fear of current or ex partner: No    Emotionally abused: No    Physically abused: No    Forced sexual activity: No  Other Topics Concern  . Not on file  Social History Narrative  . Not on file    Review of Systems: See  HPI, otherwise negative ROS  Physical Exam: BP 129/70   Pulse 84   Temp (!) 96.3 F (35.7 C) (Tympanic)   Resp 20   Ht 5\' 2"  (1.575 m)   Wt 72.6 kg   SpO2 95% Comment: 90 on room air  BMI 29.26 kg/m  General:   Alert,  pleasant and cooperative  in NAD Head:  Normocephalic and atraumatic. Neck:  Supple; no masses or thyromegaly. Lungs:  Clear throughout to auscultation, normal respiratory effort.    Heart:  +S1, +S2, Regular rate and rhythm, No edema. Abdomen:  Soft, nontender and nondistended. Normal bowel sounds, without guarding, and without rebound.   Neurologic:  Alert and  oriented x4;  grossly normal neurologically.  Impression/Plan: Amy [name] is here for an EGD for positive PET scan  Risks, benefits, limitations, and alternatives regarding the procedure have been reviewed with the patient.  Questions have been answered.  All parties agreeable.   Virgel Manifold, MD  10/12/2018, 8:05 AM

## 2018-10-12 NOTE — Op Note (Signed)
[facility] Patient Name: Amy [name] Procedure Date: 10/12/2018 7:04 AM MRN: [id number] Account #: [id number] Date of Birth: 10/29/1935 Admit Type: Outpatient Age: 83 Room: [facility] ENDO ROOM 4 Gender: Female Note Status: Finalized Procedure:            Upper GI endoscopy Indications:          Abnormal PET scan of the GI tract Providers:            Rozlynn Lippold B. Bonna Gains MD, MD Referring MD:         [facility]Barton Dubois (Referring MD), Arnetha Courser (Referring MD) Medicines:            Monitored Anesthesia Care Complications:        No immediate complications. Procedure:            Pre-Anesthesia Assessment:                       - Prior to the procedure, a History and Physical was                        performed, and patient medications, allergies and                        sensitivities were reviewed. The patient's tolerance of                        previous anesthesia was reviewed.                       - The risks and benefits of the procedure and the                        sedation options and risks were discussed with the                        patient. All questions were answered and informed                        consent was obtained.                       - Patient identification and proposed procedure were                        verified prior to the procedure by the physician, the                        nurse, the anesthesiologist, the anesthetist and the                        technician. The procedure was verified in the procedure                        room.                       - ASA Grade Assessment: III - A patient with severe  systemic disease.                       After obtaining informed consent, the endoscope was                        passed under direct vision. Throughout the procedure,                        the patient's blood pressure, pulse, and oxygen                      saturations were monitored continuously. The Endoscope                        was introduced through the mouth, and advanced to the                        second part of duodenum. The upper GI endoscopy was                        accomplished with ease. The patient tolerated the                        procedure well. Findings:      The gastroesophageal junction and examined esophagus were normal.      A medium-sized, frond-like/villous, non-circumferential mass with no       bleeding was found in the cardia. Biopsies were taken with a cold       forceps for histology. It did not involve the GE junction.      Patchy mildly erythematous mucosa without bleeding was found in the       gastric antrum. Biopsies were taken with a cold forceps for histology.       Biopsies were obtained in the gastric body, at the incisura and in the       gastric antrum with cold forceps for histology.      The duodenal bulb, second portion of the duodenum and examined duodenum       were normal. Impression:           - Normal gastroesophageal junction and esophagus.                       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                       - Erythematous mucosa in the antrum. Biopsied.                       - Normal duodenal bulb, second portion of the duodenum                        and examined duodenum.                       - Biopsies were obtained in the gastric body, at the                        incisura and in the gastric antrum. Recommendation:       - Await pathology results.                       -  Discharge patient to home (with escort).                       - Advance diet as tolerated.                       - Continue present medications.                       - Patient has a contact number available for                        emergencies. The signs and symptoms of potential                        delayed complications were discussed with the patient.                         Return to normal activities tomorrow. Written discharge                        instructions were provided to the patient.                       - Discharge patient to home (with escort).                       - The findings and recommendations were discussed with                        the patient.                       - The findings and recommendations were discussed with                        the patient's family. Procedure Code(s):    --- Professional ---                       [id number], Esophagogastroduodenoscopy, flexible, transoral;                        with biopsy, single or multiple Diagnosis Code(s):    --- Professional ---                       D49.0, Neoplasm of unspecified behavior of digestive                        system                       K31.89, Other diseases of stomach and duodenum                       R93.3, Abnormal findings on diagnostic imaging of other                        parts of digestive tract CPT copyright 2019 American Medical Association. All rights reserved. The codes documented in this report are preliminary and upon coder review may  be revised to meet current compliance requirements.  [name], MD Margretta Sidle B. Booker Bhatnagar MD, MD 10/12/2018 8:36:05 AM  This report has been signed electronically. Number of Addenda: 0 Note Initiated On: 10/12/2018 7:04 AM Estimated Blood Loss: Estimated blood loss: none.      [facility]

## 2018-10-12 NOTE — Anesthesia Procedure Notes (Signed)
Date/Time: 10/12/2018 8:15 AM Performed by: Johnna Acosta, CRNA Pre-anesthesia Checklist: Patient identified, Emergency Drugs available, Suction available, Patient being monitored and Timeout performed Patient Re-evaluated:Patient Re-evaluated prior to induction Oxygen Delivery Method: Nasal cannula Preoxygenation: Pre-oxygenation with 100% oxygen Induction Type: IV induction

## 2018-10-12 NOTE — Anesthesia Post-op Follow-up Note (Signed)
Anesthesia QCDR form completed.        

## 2018-10-12 NOTE — Transfer of Care (Signed)
Immediate Anesthesia Transfer of Care Note  Patient: Amy [name]  Procedure(s) Performed: ESOPHAGOGASTRODUODENOSCOPY (EGD) WITH PROPOFOL (N/A )  Patient Location: PACU  Anesthesia Type:General  Level of Consciousness: awake, alert  and oriented  Airway & Oxygen Therapy: Patient Spontanous Breathing and Patient connected to nasal cannula oxygen  Post-op Assessment: Report given to RN and Post -op Vital signs reviewed and stable  Post vital signs: Reviewed and stable  Last Vitals:  Vitals Value Taken Time  BP 111/51 10/12/2018  8:33 AM  Temp 36.2 C 10/12/2018  8:33 AM  Pulse 90 10/12/2018  8:33 AM  Resp 17 10/12/2018  8:33 AM  SpO2 96 % 10/12/2018  8:33 AM  Vitals shown include unvalidated device data.  Last Pain:  Vitals:   10/12/18 0830  TempSrc: Tympanic  PainSc: 0-No pain         Complications: No apparent anesthesia complications

## 2018-10-12 NOTE — Anesthesia Preprocedure Evaluation (Signed)
Anesthesia Evaluation  Patient identified by MRN, date of birth, ID band Patient awake    Reviewed: Allergy & Precautions, NPO status , Patient's Chart, lab work & pertinent test results  History of Anesthesia Complications Negative for: history of anesthetic complications  Airway Mallampati: III  TM Distance: >3 FB Neck ROM: Full    Dental  (+) Poor Dentition   Pulmonary asthma , COPD,  oxygen dependent, former smoker,    breath sounds clear to auscultation- rhonchi (-) wheezing      Cardiovascular hypertension, Pt. on medications + Peripheral Vascular Disease  (-) CAD, (-) Past MI and (-) Cardiac Stents  Rhythm:Regular Rate:Normal - Systolic murmurs and - Diastolic murmurs    Neuro/Psych  Headaches, neg Seizures PSYCHIATRIC DISORDERS Anxiety Depression Dementia    GI/Hepatic Neg liver ROS, GERD  ,  Endo/Other  diabetes  Renal/GU Renal InsufficiencyRenal disease     Musculoskeletal  (+) Arthritis ,   Abdominal (+) + obese,   Peds  Hematology  (+) anemia ,   Anesthesia Other Findings Past Medical History: No date: Anemia     Comment:  low iron No date: Anxiety No date: Arthritis No date: Bilateral cataracts No date: Bronchitis     Comment:  hx of  No date: Cataract No date: Chronic kidney disease No date: Depression No date: Diabetes mellitus without complication (HCC) No date: DVT (deep venous thrombosis) (HCC)     Comment:  hx of in left leg currently has left AKA  No date: GERD (gastroesophageal reflux disease) No date: Headache No date: History of frequent urinary tract infections No date: Hx of renal cell cancer     Comment:  LEFT No date: Hyperlipidemia No date: Hypertension No date: Numbness and tingling     Comment:  right hand  No date: Obesity 10/05/2017: Osteopenia No date: Peripheral artery disease (HCC) No date: Urinary frequency No date: Urinary incontinence   Reproductive/Obstetrics                             Anesthesia Physical Anesthesia Plan  ASA: IV  Anesthesia Plan: General   Post-op Pain Management:    Induction: Intravenous  PONV Risk Score and Plan: 2 and Propofol infusion  Airway Management Planned: Natural Airway  Additional Equipment:   Intra-op Plan:   Post-operative Plan:   Informed Consent: I have reviewed the patients History and Physical, chart, labs and discussed the procedure including the risks, benefits and alternatives for the proposed anesthesia with the patient or authorized representative who has indicated his/her understanding and acceptance.     Dental advisory given  Plan Discussed with: CRNA and Anesthesiologist  Anesthesia Plan Comments:         Anesthesia Quick Evaluation

## 2018-10-12 NOTE — Anesthesia Postprocedure Evaluation (Signed)
Anesthesia Post Note  Patient: Amy [name]  Procedure(s) Performed: ESOPHAGOGASTRODUODENOSCOPY (EGD) WITH PROPOFOL (N/A )  Patient location during evaluation: Endoscopy Anesthesia Type: General Level of consciousness: awake and alert and oriented Pain management: pain level controlled Vital Signs Assessment: post-procedure vital signs reviewed and stable Respiratory status: spontaneous breathing, nonlabored ventilation and respiratory function stable Cardiovascular status: blood pressure returned to baseline and stable Postop Assessment: no signs of nausea or vomiting Anesthetic complications: no     Last Vitals:  Vitals:   10/12/18 0833 10/12/18 0840  BP: (!) 111/51 128/67  Pulse: 91   Resp: (!) 31   Temp: (!) 36.2 C   SpO2: 91%     Last Pain:  Vitals:   10/12/18 0850  TempSrc:   PainSc: 0-No pain                 Eathen Budreau

## 2018-10-13 ENCOUNTER — Encounter: Payer: Self-pay | Admitting: Gastroenterology

## 2018-10-13 LAB — SURGICAL PATHOLOGY

## 2018-10-13 NOTE — Progress Notes (Signed)
I Called the pt and spoke to her, and her husband [name]. I informed them of the biopsy results showing cancer. I answered all their questions to their satisfaction. We discussed that biopsies showed atleast intramucosal adenocarcinoma and what this means. Further workup and treatment to be determined I'm discussion with Dr. Mike Gip and I have informed her of the results via Epic messaging. Pt is aware they need to follow up with her  and will call her office next week.

## 2018-10-17 ENCOUNTER — Other Ambulatory Visit: Payer: Self-pay | Admitting: Family Medicine

## 2018-10-17 ENCOUNTER — Encounter: Payer: Self-pay | Admitting: Student in an Organized Health Care Education/Training Program

## 2018-10-17 DIAGNOSIS — E781 Pure hyperglyceridemia: Secondary | ICD-10-CM

## 2018-10-18 ENCOUNTER — Encounter: Payer: Self-pay | Admitting: Student in an Organized Health Care Education/Training Program

## 2018-10-18 ENCOUNTER — Other Ambulatory Visit: Payer: Self-pay

## 2018-10-18 ENCOUNTER — Ambulatory Visit
Payer: Medicare HMO | Attending: Student in an Organized Health Care Education/Training Program | Admitting: Student in an Organized Health Care Education/Training Program

## 2018-10-18 DIAGNOSIS — N184 Chronic kidney disease, stage 4 (severe): Secondary | ICD-10-CM | POA: Diagnosis not present

## 2018-10-18 DIAGNOSIS — F0281 Dementia in other diseases classified elsewhere with behavioral disturbance: Secondary | ICD-10-CM | POA: Diagnosis not present

## 2018-10-18 DIAGNOSIS — F1120 Opioid dependence, uncomplicated: Secondary | ICD-10-CM | POA: Diagnosis not present

## 2018-10-18 DIAGNOSIS — G546 Phantom limb syndrome with pain: Secondary | ICD-10-CM

## 2018-10-18 DIAGNOSIS — Z89612 Acquired absence of left leg above knee: Secondary | ICD-10-CM | POA: Diagnosis not present

## 2018-10-18 DIAGNOSIS — G894 Chronic pain syndrome: Secondary | ICD-10-CM | POA: Diagnosis not present

## 2018-10-18 DIAGNOSIS — G301 Alzheimer's disease with late onset: Secondary | ICD-10-CM

## 2018-10-18 DIAGNOSIS — Z905 Acquired absence of kidney: Secondary | ICD-10-CM | POA: Diagnosis not present

## 2018-10-18 MED ORDER — HYDROCODONE-ACETAMINOPHEN 7.5-325 MG PO TABS: 1.0000 | ORAL_TABLET | Freq: Three times a day (TID) | ORAL | 0 refills | Status: DC | PRN

## 2018-10-18 NOTE — Progress Notes (Signed)
Pain Management Virtual Encounter Note - Virtual Visit Telehealth (real-time audio visits between healthcare provider and patient).  Patient's Phone No. & Preferred Pharmacy:  [phone] (home); [phone] (mobile); (Preferred) [phone] No e-mail address on record  [place], [place] [address] [place] [address] Phone: [phone] Fax: [phone]  [facility] [place], [place] [address] Phone: [phone] Fax: [phone]   Pre-screening note:  Our staff contacted Amy [name] and offered her an "in person", "face-to-face" appointment versus a telephone encounter. She indicated preferring the telephone encounter, at this time.  Reason for Virtual Visit: COVID-19*  Social distancing based on CDC and AMA recommendations.   I contacted Amy [name] on 10/18/2018 at 10:37 AM via video conference.      I clearly identified myself as Amy Santa, MD. I verified that I was speaking with the correct person using two identifiers (Name and date of birth: [birth year]).  Advanced Informed Consent I sought verbal advanced consent from Amy [name] for virtual visit interactions. I informed Ms. Pankonin of possible security and privacy concerns, risks, and limitations associated with providing "not-in-person" medical evaluation and management services. I also informed Ms. Moragne of the availability of "in-person" appointments. Finally, I informed her that there would be a charge for the virtual visit and that she could be  personally, fully or partially, financially responsible for it. Ms. Truss expressed understanding and agreed to proceed.   Historic Elements   [name] is a 83 y.o. year old, female patient evaluated today after her last encounter by our practice on 06/21/2018. Amy [name]  has a past medical history of Anemia, Anxiety,  Arthritis, Bilateral cataracts, Bronchitis, Cataract, Chronic kidney disease, Depression, Diabetes mellitus without complication ([place]), DVT (deep venous thrombosis) ([place]), GERD (gastroesophageal reflux disease), Headache, History of frequent urinary tract infections, renal cell cancer, Hyperlipidemia, Hypertension, Numbness and tingling, Obesity, Osteopenia (10/05/2017), Peripheral artery disease ([place]), Urinary frequency, and Urinary incontinence. She also  has a past surgical history that includes Leg amputation; Abdominal hysterectomy (1978); Cholecystectomy; Carpal tunnel release (Bilateral); stent placement right leg ; Robot assisted laparoscopic nephrectomy (Left, 01/15/2015); Colonoscopy; Colonoscopy with esophagogastroduodenoscopy (egd); ERCP (N/A, 08/19/2017); and Esophagogastroduodenoscopy (egd) with propofol (N/A, 10/12/2018). [name] has a current medication list which includes the following prescription(s): vitamin c, clopidogrel, donepezil, fluoxetine, gabapentin, ipratropium-albuterol, pantoprazole, quetiapine, atorvastatin, hydrocodone-acetaminophen, and hydrocodone-acetaminophen. She  reports that she quit smoking about 14 years ago. Her smoking use included cigarettes. She has a 76.50 pack-year smoking history. She has never used smokeless tobacco. She reports previous alcohol use. She reports that she does not use drugs. [name] is allergic to fentanyl and penicillins.   HPI  I last communicated with her on 06/21/2018. Today, she is being contacted for medication management.  No change in medical history, compliant with therapy. Pain at baseline. Med refill  Pharmacotherapy Assessment   09/13/2018  1   06/21/2018  Hydrocodone-Acetamin 7.5-325  90.00 30 Bi Lat   [id number]   Thr (4878)   0  22.50 MME  Medicare   [place]      Monitoring: Pharmacotherapy: No side-effects or adverse reactions reported. [place] PMP: PDMP reviewed during this encounter.       Compliance: No problems  identified. Effectiveness: Clinically acceptable. Plan: Refer to "POC".  Pertinent Labs  Renal Function Lab Results  Component Value Date   BUN 28 (H) 07/26/2018  CREATININE 1.87 (H) 07/26/2018   BCR 9 07/12/2018   GFRAA 29 (L) 07/26/2018   GFRNONAA 25 (L) 07/26/2018   Hepatic Function Lab Results  Component Value Date   AST 34 07/25/2018   ALT 37 07/25/2018   ALBUMIN 2.9 (L) 07/25/2018   UDS No results found for: SUMMARY Note: Above Lab results reviewed.  Recent imaging  NM PET Image Initial (PI) Skull Base To Thigh CLINICAL DATA:  Initial treatment strategy for pulmonary nodule.  EXAM: NUCLEAR MEDICINE PET SKULL BASE TO THIGH  TECHNIQUE: 8.0 mCi F-18 FDG was injected intravenously. Full-ring PET imaging was performed from the skull base to thigh after the radiotracer. CT data was obtained and used for attenuation correction and anatomic localization.  Fasting blood glucose: 86 mg/dl  COMPARISON:  CT chest from 07/17/2018  FINDINGS: Mediastinal blood pool activity: SUV max 2.4  NECK: No significant abnormal hypermetabolic activity in this region.  Incidental CT findings: Mild polypoid mucoperiosteal thickening in the right maxillary sinus.  CHEST: Right paratracheal node 1.0 cm in short axis on image 79/3 with maximum SUV 3.4. Lower right paratracheal lymph node at the level of the carina with a fatty hilum, measuring 1.3 cm in short axis on image 91/3, maximum SUV 2.9.  Subtle accentuated right hilar activity, maximum SUV 3.9. Small right pleural effusion with mildly accentuated metabolic activity in the fusion, maximum SUV 2.4.  There is soft tissue density around the right internal mammary vessels with faint calcification, ovoid density measuring 1.1 by 1.9 cm on image 79/3, maximum SUV 2.0.  The subpleural nodularity inferiorly in the anterior right upper lobe measures 1.2 by 0.7 cm on image 113/3, stable, with maximum SUV of 1.5.  A 0.8 cm  left lower lobe pulmonary nodule on image 112/3 has a maximum SUV of 1.5.  An irregular 1.0 by 0.8 cm nodule in the left upper lobe adjacent to the mediastinum on image 78/3 has maximum SUV of 1.5.  There is some irregular airspace opacity posteriorly in the aerated portion of the right lower lobe adjacent to the pleural effusion has fairly low-grade activity with maximum SUV of 2.3. On prior CT this was favored as being rounded atelectasis.  A band of subpleural density in the superior segment right lower lobe measuring 2.2 by 0.8 cm is mostly sub solid on image 101/3, but has a maximum SUV of 3.1.  Incidental CT findings: Coronary, aortic arch, and branch vessel atherosclerotic vascular disease. Centrilobular emphysema.  ABDOMEN/PELVIS: Focally accentuated activity the stomach immediately below the gastroesophageal junction noted measuring 2.5 cm in long axis with maximum SUV of 16.2.  Focally accentuated activity in a left upper quadrant loop of jejunum without discrete CT correlate.  Incidental CT findings: Cholecystectomy. Left nephrectomy. Large photopenic cyst of the right kidney laterally. Aortoiliac atherosclerotic vascular disease.  SKELETON: Accentuated activity in the left eighth rib laterally, maximum SUV 2.9, probably from a small healing nondisplaced rib fracture fractures not well seen.  Tapered left femur, query below the knee amputation.  Incidental CT findings: No supplemental non-categorized findings.  IMPRESSION: 1. A 2.5 cm potential lesion in the stomach just below the GE junction has a maximum SUV of 16.2. Although occasionally physiologic activity can simulate a lesion, a gastric malignancy is not excluded particularly in light of the focal nature of this activity. I am aware that the patient had recent ERCP and have reviewed that report, but this lesion is in a location potentially difficult to see during the more limited  gastric assessment of  an ERCP. A full diagnostic upper endoscopy with special attention to the stomach just below the GE junction should be strongly considered to rule out gastric malignancy. 2. There is also some accentuated activity in left upper quadrant jejunal loop, but this is more likely to be artifactual given the lack of a CT correlate. 3. Findings in the chest are complex, with low-grade activity which could be reactive/inflammatory or due to low-grade malignancy. There are a variety of mildly enlarged lymph nodes, some with activity mildly above the background blood pool. Most of the pulmonary nodules have low-grade activity meriting surveillance to exclude low-grade adenocarcinoma, but quite likely benign. The airspace opacity posteriorly in the aerated portion of the right lower lobe has low activity, probably reflecting rounded atelectasis. There is also a bandlike subpleural density in the superior segment right lower lobe which is mildly hypermetabolic with maximum SUV of 3.1, but with a sub solid appearance and contour atypical for malignancy. Overall I feel that the chest findings should be surveilled. 4. Small right pleural effusion has accentuated activity suggesting possible exudative effusion. 5. Accentuated activity in the left eighth rib laterally with maximum SUV 2.9. I favor that this is probably from a small healing nondisplaced rib fracture which is otherwise occult. This area can be monitored along with the rest of the chest with follow up CT. 6. Other imaging findings of potential clinical significance: Aortic Atherosclerosis (ICD10-I70.0) and Emphysema (ICD10-J43.9). Left nephrectomy. Large right renal cyst. Mild chronic right maxillary sinusitis.  Electronically Signed   By: Van Clines M.D.   On: 08/24/2018 14:45  Assessment  The primary encounter diagnosis was Chronic pain syndrome. 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  Plan of Care  I have discontinued Bree[employer] L. Voiles's Zinc Oxide, acetaminophen, UNABLE TO FIND, benzonatate, and HYDROcodone-acetaminophen. I am also having her start on HYDROcodone-acetaminophen and HYDROcodone-acetaminophen. Additionally, I am having her maintain her donepezil, ipratropium-albuterol, gabapentin, FLUoxetine, QUEtiapine, pantoprazole, vitamin C, and clopidogrel.  Pharmacotherapy (Medications Ordered): Meds ordered this encounter  Medications  . HYDROcodone-acetaminophen ([place]) 7.5-325 MG tablet    Sig: Take 1 tablet by mouth every 8 (eight) hours as needed for up to 30 days for severe pain. Must last 30 days.    Dispense:  90 tablet    Refill:  0    [place] STOP ACT - Not applicable. Fill one day early if pharmacy is closed on scheduled refill date.  [name] HYDROcodone-acetaminophen (NORCO) 7.5-325 MG tablet    Sig: Take 1 tablet by mouth every 8 (eight) hours as needed for up to 30 days for severe pain. Must last 30 days.    Dispense:  90 tablet    Refill:  0    [place] STOP ACT - Not applicable. Fill one day early if pharmacy is closed on scheduled refill date.   Orders:  No orders of the defined types were placed in this encounter.  Follow-up plan:   Return in about 9 weeks (around 12/20/2018) for Medication Management.    I discussed the assessment and treatment plan with the patient. The patient was provided an opportunity to ask questions and all were answered. The patient agreed with the plan and demonstrated  an understanding of the instructions.  Patient advised to call back or seek an in-person evaluation if the symptoms or condition worsens.  Total duration of  non-face-to-face encounter: 25 minutes.  Note by: Amy Santa, MD Date: 10/18/2018; Time: 10:37 AM  Note: This dictation was prepared with Dragon dictation. Any transcriptional errors that may result from this process are unintentional.  Disclaimer:  * Given the special circumstances of the COVID-19 pandemic, the federal government has announced that the Office for Civil Rights (OCR) will exercise its enforcement discretion and will not impose penalties on physicians using telehealth in the event of noncompliance with regulatory requirements under the [employer] (HIPAA) in connection with the good faith provision of telehealth during the FXOVA-91 national public health emergency. ([place])

## 2018-10-19 ENCOUNTER — Other Ambulatory Visit: Payer: Self-pay

## 2018-10-19 ENCOUNTER — Ambulatory Visit: Payer: Medicare HMO | Admitting: Gastroenterology

## 2018-10-19 NOTE — Progress Notes (Signed)
[facility]  [address] [place], [place] [address] Phone: [phone]  Fax: [phone]   Telemedicine Office Visit:  10/20/2018  Referring physician: Arnetha Courser, MD  I connected with Chales Abrahams on 10/20/2018 at 9:15 AM by telephone and verified that I was speaking with the correct person using 2 identifiers.  The patient was at home.  I discussed the limitations, risk, security and privacy concerns of performing an evaluation and management service by telephone and the availability of in person appointments.  I also discussed with the patient that there may be a patient responsible charge related to this service.  The patient expressed understanding and agreed to proceed.   Chief Complaint: Amy [name] is a 83 y.o. female with iron deficiency anemia and a history of stage III renal cell carcinoma who is seen for review of interval gastric biopsy confirming adenocarcinoma.  HPI:  The patient was last seen in the oncology clinic on 08/28/2018.  At that time, she noted chronic respiratory symptoms (shortness of breath with exertion, cough, wheezing).  PET scan had revealed a lesion potentially in the gastric region just below the GE junction.  She was referred to GI for direct visualization.  Her case was presented at [facility] on 08/31/2018.  GI referral for the gastric lesion was also recommended.   She was seen by Dr. [name], gastroenterology, on 09/14/2018.  EGD was scheduled, but it was delayed several times due to difficulty obtaining a COVID-19 test. Clinic follow-up was scheduled.   EGD on 10/12/2018 revealed normal gastroesophageal junction and esophagus. There was a gastric tumor in the cardia. There was erythematous mucosa in the antrum. Examined duodenum was normal. Biopsies were obtained in the gastric body, the incisura, and in the gastric antrum.  Pathology from the cardia revealed adenocarcinoma, moderately  differentiated intestinal type, at least intramucosal, and intestinal metaplasia in the stomach mass. There was also antral and oxyntic mucosa with mild chronic inactive gastritis, negative for H pylori, intestinal metaplasia, dysplasia, or malignancy.   She was seen in nephrology by Dr. Holley Raring on 10/18/2018. She was started on Vicodin for chronic pain.   During the interim, the patient reports "I'm doing better than I was." Her energy level is good. Her weight is stable, and appetite is decreased, but better than a few weeks ago. She denies nausea, vomiting, or diarrhea. She has a BM every few days. She has occasional shortness of breath.  Her husband is concerned about her dementia worsening. She refuses to do much and get out of her bed, even taking a bath or go to the doctor. He desires for her to pursue surgery, and will pursue help from his son to care for her if needed.   Past Medical History:  Diagnosis Date   Anemia    low iron   Anxiety    Arthritis    Bilateral cataracts    Bronchitis    hx of    Cataract    Chronic kidney disease    Depression    Diabetes mellitus without complication (HCC)    DVT (deep venous thrombosis) (HCC)    hx of in left leg currently has left AKA    GERD (gastroesophageal reflux disease)    Headache    History of frequent urinary tract infections    Hx of renal cell cancer    LEFT   Hyperlipidemia    Hypertension    Numbness and tingling  right hand    Obesity    Osteopenia 10/05/2017   Peripheral artery disease (HCC)    Urinary frequency    Urinary incontinence     Past Surgical History:  Procedure Laterality Date   ABDOMINAL HYSTERECTOMY  1978   CARPAL TUNNEL RELEASE Bilateral    CHOLECYSTECTOMY     COLONOSCOPY     COLONOSCOPY WITH ESOPHAGOGASTRODUODENOSCOPY (EGD)     ERCP N/A 08/19/2017   Procedure: ENDOSCOPIC RETROGRADE CHOLANGIOPANCREATOGRAPHY (ERCP);  Surgeon: Arta Silence, MD;  [facility]  ENDOSCOPY;  Service: Endoscopy;  Laterality: N/A;   ESOPHAGOGASTRODUODENOSCOPY (EGD) WITH PROPOFOL N/A 10/12/2018   Procedure: ESOPHAGOGASTRODUODENOSCOPY (EGD) WITH PROPOFOL;  Surgeon: Virgel Manifold, MD;  Location: [facility] ENDOSCOPY;  Service: Endoscopy;  Laterality: N/A;   LEG AMPUTATION     above the knee / left    ROBOT ASSISTED LAPAROSCOPIC NEPHRECTOMY Left 01/15/2015   Procedure: ROBOTIC ASSISTED LAPAROSCOPIC RADICAL NEPHRECTOMY;  Surgeon: Alexis Frock, MD;  Location: [facility] ORS;  Service: Urology;  Laterality: Left;   stent placement right leg       Family History  Problem Relation Age of Onset   Cancer Mother        Stomach   Cancer Father    Diabetes Brother    Cancer Brother        oral   Healthy Son    Healthy Sister     Social History:  reports that she quit smoking about 14 years ago. Her smoking use included cigarettes. She has a 76.50 pack-year smoking history. She has never used smokeless tobacco. She reports previous alcohol use. She reports that she does not use drugs. She is an Therapist, sports.  She lives in [place].  Her husband's name is [name]. She is accompanied by her husband today, who is her primary historian do to her dementia.    Participants in the patient's visit and their role in the encounter included the patient, her husband, and [employer], Therapist, sports, today.  The intake visit was provided by Waymon Budge, RN.  Allergies:  Allergies  Allergen Reactions   Fentanyl Other (See Comments)    urine retention   Penicillins Itching, Rash and Other (See Comments)    Has patient had a PCN reaction causing immediate rash, facial/tongue/throat swelling, SOB or lightheadedness with hypotension: Yes Has patient had a PCN reaction causing severe rash involving mucus membranes or skin necrosis: No Has patient had a PCN reaction that required hospitalization: No Has patient had a PCN reaction occurring within the last 10 years: Yes If all of the above answers  are "NO", then may proceed with Cephalosporin use.    Current Medications: Current Outpatient Medications  Medication Sig Dispense Refill   Ascorbic Acid (VITAMIN C) 1000 MG tablet Take 1,000 mg by mouth daily.     atorvastatin (LIPITOR) 20 MG tablet TAKE 1 TABLET EVERY NIGHT AT BEDTIME 90 tablet 0   clopidogrel (PLAVIX) 75 MG tablet TAKE 1 TABLET EVERY DAY 90 tablet 3   donepezil (ARICEPT) 10 MG tablet Take 1 tablet (10 mg total) by mouth at bedtime. 90 tablet 3   FLUoxetine (PROZAC) 10 MG capsule Take 1 capsule (10 mg total) by mouth daily. 30 capsule 0   gabapentin (NEURONTIN) 300 MG capsule Take 1 capsule (300 mg total) by mouth 3 (three) times daily. 270 capsule 3   [START ON 11/22/2018] HYDROcodone-acetaminophen (NORCO) 7.5-325 MG tablet Take 1 tablet by mouth every 8 (eight) hours as needed for up to 30 days for severe  pain. Must last 30 days. 90 tablet 0   ipratropium-albuterol (DUONEB) 0.5-2.5 (3) MG/3ML SOLN INHALE THE CONTENTS OF 1 VIAL VIA NEBULIZER THREE TIMES DAILY AS DIRECTED (Patient taking differently: Inhale 3 mLs into the lungs 3 (three) times daily. ) 810 mL 2   pantoprazole (PROTONIX) 40 MG tablet Take 1 tablet (40 mg total) by mouth daily. 90 tablet 0   QUEtiapine (SEROQUEL) 25 MG tablet Take 1 tablet (25 mg total) by mouth See admin instructions. Take 1 tablet in a.m. and 1 tablet in the afternoon and 2 tablets(50 mg total) before bedtime around 8 to 9 PM 150 tablet 0   No current facility-administered medications for this visit.     Review of Systems  Constitutional: Positive for malaise/fatigue (energy levels good). Negative for chills, diaphoresis, fever and weight loss (stable).       Feels "fine".  HENT: Positive for hearing loss. Negative for congestion, nosebleeds, sinus pain and sore throat.   Eyes: Negative.  Negative for blurred vision, double vision, photophobia and pain.  Respiratory: Positive for cough (chronic) and shortness of breath (on  exertion).   Cardiovascular: Negative.  Negative for chest pain, palpitations, orthopnea, leg swelling and PND.  Gastrointestinal: Positive for heartburn (at night). Negative for abdominal pain, blood in stool, constipation, diarrhea, melena, nausea and vomiting.       Eating "alright". BM every other day.  Genitourinary: Negative.  Negative for dysuria, frequency and urgency.  Musculoskeletal: Negative.  Negative for back pain, joint pain and myalgias.  Skin: Negative.  Negative for rash.  Neurological: Positive for weakness (generalized). Negative for dizziness, tingling, sensory change and headaches.  Endo/Heme/Allergies: Negative.  Does not bruise/bleed easily.  Psychiatric/Behavioral: Positive for memory loss (dementia). Negative for depression. The patient is not nervous/anxious and does not have insomnia.   All other systems reviewed and are negative.   Performance status (ECOG):  2-3  Imaging studies: 11/29/2014:  Abdomen and pelvic CT revealed a 5.5 x 3.7 solid enhancing mass arising from the upper pole of the left kidney.  There was evidence of renal vein invasion.  There were borderline enlarged periaortic lymph node (9 mm).  08/16/2016:  Chest, abdomen, and pelvic CT revealed left nephrectomy without evidence of metastatic disease.  There were low-attenuation lesions in the right kidney, likely cysts although definitive characterization is limited without post-contrast imaging.  There was an ectatic abdominal aorta at risk for aneurysm development.  02/16/2017:  Chest, abdomen and pelvic CT revealed no evidence of recurrent disease.  There was a 7 x 5 mm right upper lobe pulmonary nodule (previously 6 x 4 mm). There was a 3 mm (previously 1-2 mm) left lower lobe pulmonary nodule. 08/12/2017:  Chest CT revealed no active pulmonary disease. Mild centrilobular emphysema. There was stable appearing pleural-based nodular opacity in the anterior aspect of the right upper lobe measuring 5.6  mm. There was stable 4 mm medial left upper lobe pleural-based opacity is unchanged.  08/12/2017:  Abdomen and pelvic CT revealed a 9 mm hyperdensity along the expected location of the distal common bile duct near the ampulla with dilatation of the CBD to 17 mm.  Simple renal cysts of the right kidney were noted with a 1.1 cm hyperdense complex appearing lesion possibly representing a hemorrhagic or proteinaceous cyst.  No worrisome features were identified with respect of this complex lesion.   08/14/2017:  MR abdomen MRCP revealed a solitary 9 mm choledocholith in the mid to upper common bile duct. There was  mild diffuse intrahepatic biliary ductal dilatation with dilated CBD (13 mm diameter).  There was indeterminate small 1.2 cm renal cortical mass in the posterior lower right kidney, incompletely characterized on this noncontrast MRI study, renal neoplasm not excluded. Recommend attention on follow-up MRI (preferred) or CT abdomen without and with IV contrast in 6 months.  There was ectatic 2.7 cm infrarenal abdominal aorta.  Ectatic abdominal aorta at risk for aneurysm development. Recommend followup by ultrasound in 5 years. 02/13/2018:  Chest, abdomen, and pelvic CT  revealed interval growth of 8 mm solid medial left lower lobe pulmonary nodule, cannot exclude enlarging pulmonary metastasis. This nodule was slightly below PET resolution. There was interval growth of 9 mm medial left upper lobe ground-glass pulmonary nodule. There were no additional potential findings of metastatic disease.  There was no evidence of local tumor recurrence in the left nephrectomy bed.  There was an ectatic 2.8 cm infrarenal abdominal aorta, stable at risk for aneurysm development.  05/26/2018:  Chest CT revealed further enlargement of part solid left upper lobe nodule, potentially adenocarcinoma.  Left upper lobe measured 12 x 8 mm (previously 9 mm maximally). The left lower lobe solid nodule had not significantly changed  over the last 3 months, although had enlarged over the last 10 months and could reflect a metastasis.  There was new mild right paratracheal lymphadenopathy. Consider PET-CT for further evaluation.  Alternatively, continued short-term CT follow-up could be performed. 08/24/2018:  PET scan revealed a 2.5 cm potential lesion in the stomach just below the GE junction has a maximum SUV of 16.2. There was also some accentuated activity in left upper quadrant jejunal loop.  There were a variety of mildly enlarged lymph nodes, some with activity mildly above the background blood pool.  Most of the pulmonary nodules had low-grade activity (benign versus low-grade adenocarcinoma).  The airspace opacity posteriorly in the aerated portion of the right lower lobe had low activity, probably reflecting rounded atelectasis. There was a bandlike subpleural density in the superior segment right lower lobe which was mildly hypermetabolic (SUV 3.1), but with a sub solid appearance and contour atypical for malignancy.  There was a small right pleural effusion that had accentuated activity suggesting possible exudative effusion.  There was accentuated activity in the left eighth rib laterally (SUV 2.9), favor small healing nondisplaced rib fracture which was otherwise occult.   No visits with results within 3 Day(s) from this visit.  Latest known visit with results is:  Admission on 10/12/2018, Discharged on 10/12/2018  Component Date Value Ref Range Status   Glucose-Capillary 10/12/2018 98  70 - 99 mg/dL Final   SURGICAL PATHOLOGY 10/12/2018    Final                   Value:Surgical Pathology CASE: [id number] PATIENT: August Luz Surgical Pathology Report     SPECIMEN SUBMITTED: A. Stomach mass, cardia; cbx B. Stomach, r/o h pylori; cbx  CLINICAL HISTORY: None provided  PRE-OPERATIVE DIAGNOSIS: Abnormal PET scan with incidental hypermetabolic lesion just below GEJ.  POST-OPERATIVE  DIAGNOSIS: Gastric cardia mass, gastric erythema     DIAGNOSIS: A. STOMACH MASS, CARDIA; COLD BIOPSY: - ADENOCARCINOMA, MODERATELY DIFFERENTIATED INTESTINAL TYPE, AT LEAST INTRAMUCOSAL. - INTESTINAL METAPLASIA INVOLVING ONE SUPERFICIAL FRAGMENT OF NON-NEOPLASTIC MUCOSA.  B. STOMACH; COLD BIOPSY: - ANTRAL AND OXYNTIC MUCOSA WITH MILD CHRONIC INACTIVE GASTRITIS. - NEGATIVE FOR H. PYLORI, INTESTINAL METAPLASIA, DYSPLASIA, AND MALIGNANCY.  Comment: The diagnosis on part A was communicated to Dr. Bonna Gains on 10/13/2018  via [place].  GROSS DESCRIPTION: A. Labeled: C BX gastric cardia mass Received: Formalin Tissue fragment(s)                         : Multiple Size: Aggregate, 0.6 x 0.2 x 0.1 cm Description: Tan soft tissue fragments Entirely submitted in 1 cassette.  B. Labeled: C BX gastric region rule out H. pylori Received: Formalin Tissue fragment(s): Multiple Size: Aggregate, 1.0 x 0.2 x 0.1 cm Description: Tan soft tissue fragments Entirely submitted in 1 cassette.   Final Diagnosis performed by Bryan Lemma, MD.   Electronically signed 10/13/2018 3:45:28PM The electronic signature indicates that the named Attending Pathologist has evaluated the specimen  Technical component performed at [facility], [address], [place], [place] [address] Lab: [phone] Dir: Rush Farmer, MD, MMM  Professional component performed at [facility], [facility], [place], [place], [place] [address] Lab: [phone] Dir: Dellia Nims. Rubinas, MD     Assessment:  Amy [name] is a 83 y.o. female with a history of stage III renal cell carcinoma and recently diagnosed gastric carcinoma.  She has a history of stage III renal cell carcinoma s/p robotic-assisted laparoscopic left radical nephrectomy on 01/09/2015. Pathology revealed a 6.3 cm renal cell carcinoma.  Tumor was predominantly clear cell with a minor component of type I papillary.  Fuhrman grade 3  of 4.  Tumor extended into the renal vein.  There was focal extension into the perirenal and sinus adipose tissue.  Margins were negative.  There was metastatic tumor in 2 of 2 lymph nodes.  Pathologic stage was T3aN1Mx.    PET scan on 08/24/2018 revealed a 2.5 cm potential lesion in the stomach just below the GE junction has a maximum SUV of 16.2. There was also some accentuated activity in left upper quadrant jejunal loop.  There were a variety of mildly enlarged lymph nodes, some with activity mildly above the background blood pool.  Most of the pulmonary nodules had low-grade activity (benign versus low-grade adenocarcinoma).  The airspace opacity posteriorly in the aerated portion of the right lower lobe had low activity, probably reflecting rounded atelectasis. There was a bandlike subpleural density in the superior segment right lower lobe which was mildly hypermetabolic (SUV 3.1), but with a sub solid appearance and contour atypical for malignancy.  There was a small right pleural effusion that had accentuated activity suggesting possible exudative effusion.  There was accentuated activity in the left eighth rib laterally (SUV 2.9), favor small healing nondisplaced rib fracture which was otherwise occult.  EGD on 10/12/2018 revealed normal gastroesophageal junction and esophagus. There was a gastric tumor in the cardia. There was erythematous mucosa in the antrum. Examined duodenum was normal.  Pathology from the cardia revealed adenocarcinoma, moderately differentiated intestinal type, at least intramucosal, and intestinal metaplasia in the stomach mass. There was also antral and oxyntic mucosa with mild chronic inactive gastritis, negative for H pylori, intestinal metaplasia, dysplasia, or malignancy.   She underwent ERCP on 08/19/2017 for chloedocholithiasis.  One pancreatic stent was placed into the ventral pancreatic duct.  A filling defect consistent with a stone was seen on the cholangiogram.  The entire main bile duct and common hepatic duct were moderately dilated.  Choledocholithiasis was found. Complete removal was accomplished by biliary sphincterotomy and basket extraction.  She has iron deficiency anemia.  Ferritin was 18 on 08/10/2016, 22 on 09/07/2016, 69 on 12/07/2016, 81 on 02/23/2017, 67 on 08/24/2017, 50 on  02/03/2018, 22 on 03/01/2018, and 25 on 05/31/2018.  She denies pica.  She is on oral iron.  She has chronic renal insufficiency.  Creatinine was 1.86 (CrCl 26 ml/min) on 06/15/2016.  She has a history of left lower extremity DVT.  She is s/p left AKA.  Symptomatically, she feels "fine".  Weight is stable.  She denies any pain.  Plan: 1.   Gastric carcinoma  Patient is s/p EGD with moderately differentiated gastric adenocarcinoma in the cardia.  Discuss unclear depth of lesion and if any concerning lymph nodes (none + on PET scan).  Pathology report notes lesion at least intramucosal.  Discuss referral to [facility] for endoscopic ultrasound and possible endoscopic dissection.   If tumor T1a (invades lamina propria or muscularis mucosa) may be a candidate for ER.   If tumor T1b or higher (invades submucosa) not a candidate for endoscopic therapy.  Unclear if patient is a surgical candidate (gastric resection).  If patient not a candidate for ER or surgery (if localized), send tumor for Her2/neu, PDL-1, MMR/MSI.  Contact Mariea Clonts, RN, nurse navigator for GI malignancies- done. 2.   Stage III renal cell carcinoma             No evidence of local recurrence on recent PET scan.             Unclear significance of pulmonary nodules.             Continue surveillance. 3.   Pulmonary nodules             Etiology of pulmonary nodules unclear.             Most nodules have low grade activity (benign or low grade adenocarcinoma).             Patient with small right pleural effusion and healing rib lesion.             Chest CT scheduled on 11/27/2018. 4.   Referral  to [facility] for endoscopic ultrasound and possible endoscopic dissection. 5.   Patient to schedule follow-up appt in clinic after evaluation and treatment at [facility].          I discussed the assessment and treatment plan with the patient.  The patient was provided an opportunity to ask questions and all were answered.  The patient agreed with the plan and demonstrated an understanding of the instructions.  The patient was advised to call back or seek an in person evaluation if the symptoms worsen or if the condition fails to improve as anticipated.  I provided 23 minutes (9:15 AM - 9:38 AM) of face-to-face video visit time during this this encounter and > 50% was spent counseling as documented under my assessment and plan.  I provided these services from the [facility] office.   Nolon Stalls, MD, PhD  10/20/2018, 9:15 AM  I, Molly Dorshimer, am acting as Education administrator for [employer]. Mike Gip, MD, PhD.  I, Sheyli Horwitz C. Mike Gip, MD, have reviewed the above documentation for accuracy and completeness, and I agree with the above.

## 2018-10-20 ENCOUNTER — Encounter: Payer: Self-pay | Admitting: Hematology and Oncology

## 2018-10-20 ENCOUNTER — Inpatient Hospital Stay: Payer: Medicare HMO | Attending: Hematology and Oncology | Admitting: Hematology and Oncology

## 2018-10-20 DIAGNOSIS — C162 Malignant neoplasm of body of stomach: Secondary | ICD-10-CM | POA: Diagnosis not present

## 2018-10-20 DIAGNOSIS — D49519 Neoplasm of unspecified behavior of unspecified kidney: Secondary | ICD-10-CM | POA: Diagnosis not present

## 2018-10-20 DIAGNOSIS — D509 Iron deficiency anemia, unspecified: Secondary | ICD-10-CM

## 2018-10-20 NOTE — Progress Notes (Signed)
Confirmed Name, DOB, and Address. Denies any concerns at this time. Husband is present on phone with patent.

## 2018-10-23 ENCOUNTER — Other Ambulatory Visit: Payer: Self-pay

## 2018-10-23 ENCOUNTER — Ambulatory Visit (INDEPENDENT_AMBULATORY_CARE_PROVIDER_SITE_OTHER): Payer: Medicare HMO | Admitting: Gastroenterology

## 2018-10-23 DIAGNOSIS — C169 Malignant neoplasm of stomach, unspecified: Secondary | ICD-10-CM | POA: Diagnosis not present

## 2018-10-23 NOTE — Progress Notes (Signed)
Amy Antigua, MD [address]  [place], [place] [address]  Main: [phone]  Fax: [phone]   Primary Care Physician: Arnetha Courser, MD  Virtual Visit via Telephone Note  I connected with patient on 10/23/18 at 10:00 AM EDT by telephone and verified that I am speaking with the correct person using two identifiers.   I discussed the limitations, risks, security and privacy concerns of performing an evaluation and management service by telephone and the availability of in person appointments. I also discussed with the patient that there may be a patient responsible charge related to this service. The patient expressed understanding and agreed to proceed.  Location of Patient: Home Location of Provider: Home Persons involved: Patient, her husband Amy [name], and provider only   History of Present Illness: Chief Complaint  Patient presents with  . Follow-up    gastric adenocarcinoma     HPI: Amy [name] is a 83 y.o. female with previous history of renal cell carcinoma, status post nephrectomy in 2016, with recent positive PET scan below the GE junction, and subsequent EGD showing mass at the gastric cardia with biopsies positive for adenocarcinoma, at least intramucosal.  Pt has been set up for EUS and possible ESD at [facility] by Dr. Alvia Grove to evaluate depth of lesion and determine further plan of care.   The patient denies abdominal or flank pain, anorexia, nausea or vomiting, dysphagia, change in bowel habits or black or bloody stools or weight loss.    Current Outpatient Medications  Medication Sig Dispense Refill  . Ascorbic Acid (VITAMIN C) 1000 MG tablet Take 1,000 mg by mouth daily.    [name] atorvastatin (LIPITOR) 20 MG tablet TAKE 1 TABLET EVERY NIGHT AT BEDTIME 90 tablet 0  . clopidogrel (PLAVIX) 75 MG tablet TAKE 1 TABLET EVERY DAY 90 tablet 3  . donepezil (ARICEPT) 10 MG tablet Take 1 tablet (10 mg total) by mouth at bedtime. 90 tablet 3   . FLUoxetine (PROZAC) 10 MG capsule Take 1 capsule (10 mg total) by mouth daily. 30 capsule 0  . gabapentin (NEURONTIN) 300 MG capsule Take 1 capsule (300 mg total) by mouth 3 (three) times daily. 270 capsule 3  . [START ON 11/22/2018] HYDROcodone-acetaminophen (NORCO) 7.5-325 MG tablet Take 1 tablet by mouth every 8 (eight) hours as needed for up to 30 days for severe pain. Must last 30 days. 90 tablet 0  . ipratropium-albuterol (DUONEB) 0.5-2.5 (3) MG/3ML SOLN INHALE THE CONTENTS OF 1 VIAL VIA NEBULIZER THREE TIMES DAILY AS DIRECTED (Patient taking differently: Inhale 3 mLs into the lungs 3 (three) times daily. ) 810 mL 2  . pantoprazole (PROTONIX) 40 MG tablet Take 1 tablet (40 mg total) by mouth daily. 90 tablet 0  . QUEtiapine (SEROQUEL) 25 MG tablet Take 1 tablet (25 mg total) by mouth See admin instructions. Take 1 tablet in a.m. and 1 tablet in the afternoon and 2 tablets(50 mg total) before bedtime around 8 to 9 PM 150 tablet 0   No current facility-administered medications for this visit.     Allergies as of 10/23/2018 - Review Complete 10/20/2018  Allergen Reaction Noted  . Fentanyl Other (See Comments) 11/05/2014  . Penicillins Itching, Rash, and Other (See Comments) 11/05/2014    Review of Systems:    All systems reviewed and negative except where noted in HPI.   Observations/Objective:  Labs: CMP     Component Value Date/Time   NA 140 07/26/2018 0531   NA  138 01/06/2016 1006   NA 135 08/16/2014 0510   K 4.1 07/26/2018 0531   K 3.7 08/16/2014 0510   CL 103 07/26/2018 0531   CL 102 08/16/2014 0510   CO2 26 07/26/2018 0531   CO2 25 08/16/2014 0510   GLUCOSE 97 07/26/2018 0531   GLUCOSE 98 08/16/2014 0510   BUN 28 (H) 07/26/2018 0531   BUN 23 01/06/2016 1006   BUN 20 08/16/2014 0510   CREATININE 1.87 (H) 07/26/2018 0531   CREATININE 2.51 (H) 07/12/2018 1521   CALCIUM 8.2 (L) 07/26/2018 0531   CALCIUM 7.7 (L) 08/16/2014 0510   PROT 6.2 (L) 07/25/2018 2219   PROT  6.1 08/27/2015 1430   PROT 6.1 (L) 10/04/2012 1208   ALBUMIN 2.9 (L) 07/25/2018 2219   ALBUMIN 3.8 08/27/2015 1430   ALBUMIN 3.4 10/04/2012 1208   AST 34 07/25/2018 2219   AST 22 10/04/2012 1208   ALT 37 07/25/2018 2219   ALT 20 10/04/2012 1208   ALKPHOS 73 07/25/2018 2219   ALKPHOS 60 10/04/2012 1208   BILITOT 1.1 07/25/2018 2219   BILITOT 0.6 08/27/2015 1430   BILITOT 0.8 10/04/2012 1208   GFRNONAA 25 (L) 07/26/2018 0531   GFRNONAA 17 (L) 07/12/2018 1521   GFRAA 29 (L) 07/26/2018 0531   GFRAA 20 (L) 07/12/2018 1521   Lab Results  Component Value Date   WBC 13.2 (H) 07/26/2018   HGB 12.1 07/26/2018   HCT 37.8 07/26/2018   MCV 93.3 07/26/2018   PLT 226 07/26/2018    Imaging Studies: No results found.  Assessment and Plan:   Amy [name] is a 83 y.o. y/o female with gastric adenocarcinoma with EUS pending to determine depth of lesion  Assessment and Plan: Continue f/u with [facility] as scheduled for EUS and possible ESD of the gastric lesion  Continue close f/u with Dr. Mike Gip  Colonoscopy for polyp surveillance to be scheduled at a later time given that pt is currently undergoing workup and treatment of gastric malignancy and that is more urgent issue at this time.   Last colonoscopy 2015. Last colonoscopy was by Dr. Vira Agar in May 2015.  1 diminutive transverse colon polyp was removed.  Recommendations were, no further colonoscopy needed, due to minimal findings and age.  However, on review of the pathology report, polyp showed tubular adenoma.  In addition, colonoscopy in 2012 had several polyps removed which showed tubular adenoma.  Therefore, per guidelines, polyp surveillance is recommended in atleast 5 years from last colonoscopy.  If pt is agreeable and benefits or procedure outweigh risks polyp surveillance colonoscopy can be scheduled in 6-12 months  Follow Up Instructions: F/u in 6 months   I discussed the assessment and treatment plan with the patient.  The patient was provided an opportunity to ask questions and all were answered. The patient agreed with the plan and demonstrated an understanding of the instructions.   The patient was advised to call back or seek an in-person evaluation if the symptoms worsen or if the condition fails to improve as anticipated.  I provided 15 minutes of non-face-to-face time during this encounter.   Virgel Manifold, MD  Speech recognition software was used to dictate this note.

## 2018-10-24 DIAGNOSIS — Z1159 Encounter for screening for other viral diseases: Secondary | ICD-10-CM | POA: Diagnosis not present

## 2018-10-24 DIAGNOSIS — N184 Chronic kidney disease, stage 4 (severe): Secondary | ICD-10-CM | POA: Diagnosis not present

## 2018-10-24 DIAGNOSIS — F0281 Dementia in other diseases classified elsewhere with behavioral disturbance: Secondary | ICD-10-CM | POA: Diagnosis not present

## 2018-10-24 DIAGNOSIS — F411 Generalized anxiety disorder: Secondary | ICD-10-CM | POA: Diagnosis not present

## 2018-10-24 DIAGNOSIS — D631 Anemia in chronic kidney disease: Secondary | ICD-10-CM | POA: Diagnosis not present

## 2018-10-24 DIAGNOSIS — J441 Chronic obstructive pulmonary disease with (acute) exacerbation: Secondary | ICD-10-CM | POA: Diagnosis not present

## 2018-10-24 DIAGNOSIS — G546 Phantom limb syndrome with pain: Secondary | ICD-10-CM | POA: Diagnosis not present

## 2018-10-24 DIAGNOSIS — Z01818 Encounter for other preprocedural examination: Secondary | ICD-10-CM | POA: Diagnosis not present

## 2018-10-24 DIAGNOSIS — G301 Alzheimer's disease with late onset: Secondary | ICD-10-CM | POA: Diagnosis not present

## 2018-10-25 ENCOUNTER — Telehealth: Payer: Self-pay

## 2018-10-25 NOTE — Telephone Encounter (Signed)
EUS scheduled for 6/5 with Dr. Mont Dutton.

## 2018-10-27 ENCOUNTER — Telehealth: Payer: Self-pay

## 2018-10-27 ENCOUNTER — Encounter: Payer: Self-pay | Admitting: *Deleted

## 2018-10-27 ENCOUNTER — Emergency Department
Admission: EM | Admit: 2018-10-27 | Discharge: 2018-10-27 | Disposition: A | Discharge status: Home / Self Care | Payer: Medicare HMO | Attending: Emergency Medicine | Admitting: Emergency Medicine

## 2018-10-27 ENCOUNTER — Other Ambulatory Visit: Payer: Self-pay

## 2018-10-27 DIAGNOSIS — N184 Chronic kidney disease, stage 4 (severe): Secondary | ICD-10-CM | POA: Diagnosis not present

## 2018-10-27 DIAGNOSIS — J449 Chronic obstructive pulmonary disease, unspecified: Secondary | ICD-10-CM | POA: Diagnosis not present

## 2018-10-27 DIAGNOSIS — G43909 Migraine, unspecified, not intractable, without status migrainosus: Secondary | ICD-10-CM | POA: Diagnosis not present

## 2018-10-27 DIAGNOSIS — C162 Malignant neoplasm of body of stomach: Secondary | ICD-10-CM | POA: Diagnosis not present

## 2018-10-27 DIAGNOSIS — Z79899 Other long term (current) drug therapy: Secondary | ICD-10-CM | POA: Insufficient documentation

## 2018-10-27 DIAGNOSIS — Z85528 Personal history of other malignant neoplasm of kidney: Secondary | ICD-10-CM | POA: Insufficient documentation

## 2018-10-27 DIAGNOSIS — G4733 Obstructive sleep apnea (adult) (pediatric): Secondary | ICD-10-CM | POA: Diagnosis not present

## 2018-10-27 DIAGNOSIS — N390 Urinary tract infection, site not specified: Secondary | ICD-10-CM | POA: Diagnosis not present

## 2018-10-27 DIAGNOSIS — E1122 Type 2 diabetes mellitus with diabetic chronic kidney disease: Secondary | ICD-10-CM | POA: Insufficient documentation

## 2018-10-27 DIAGNOSIS — G309 Alzheimer's disease, unspecified: Secondary | ICD-10-CM | POA: Diagnosis not present

## 2018-10-27 DIAGNOSIS — I129 Hypertensive chronic kidney disease with stage 1 through stage 4 chronic kidney disease, or unspecified chronic kidney disease: Secondary | ICD-10-CM | POA: Insufficient documentation

## 2018-10-27 DIAGNOSIS — K2970 Gastritis, unspecified, without bleeding: Secondary | ICD-10-CM | POA: Diagnosis not present

## 2018-10-27 DIAGNOSIS — C166 Malignant neoplasm of greater curvature of stomach, unspecified: Secondary | ICD-10-CM | POA: Diagnosis not present

## 2018-10-27 DIAGNOSIS — K3189 Other diseases of stomach and duodenum: Secondary | ICD-10-CM | POA: Diagnosis not present

## 2018-10-27 DIAGNOSIS — E1151 Type 2 diabetes mellitus with diabetic peripheral angiopathy without gangrene: Secondary | ICD-10-CM | POA: Diagnosis not present

## 2018-10-27 DIAGNOSIS — Z87891 Personal history of nicotine dependence: Secondary | ICD-10-CM | POA: Insufficient documentation

## 2018-10-27 DIAGNOSIS — C169 Malignant neoplasm of stomach, unspecified: Secondary | ICD-10-CM | POA: Diagnosis not present

## 2018-10-27 DIAGNOSIS — R339 Retention of urine, unspecified: Secondary | ICD-10-CM | POA: Diagnosis present

## 2018-10-27 DIAGNOSIS — K219 Gastro-esophageal reflux disease without esophagitis: Secondary | ICD-10-CM | POA: Diagnosis not present

## 2018-10-27 DIAGNOSIS — I898 Other specified noninfective disorders of lymphatic vessels and lymph nodes: Secondary | ICD-10-CM | POA: Diagnosis not present

## 2018-10-27 DIAGNOSIS — I899 Noninfective disorder of lymphatic vessels and lymph nodes, unspecified: Secondary | ICD-10-CM | POA: Diagnosis not present

## 2018-10-27 LAB — COMPREHENSIVE METABOLIC PANEL
ALT: 11 U/L (ref 0–44)
AST: 18 U/L (ref 15–41)
Albumin: 3 g/dL — ABNORMAL LOW (ref 3.5–5.0)
Alkaline Phosphatase: 89 U/L (ref 38–126)
Anion gap: 8 (ref 5–15)
BUN: 16 mg/dL (ref 8–23)
CO2: 22 mmol/L (ref 22–32)
Calcium: 8.4 mg/dL — ABNORMAL LOW (ref 8.9–10.3)
Chloride: 109 mmol/L (ref 98–111)
Creatinine, Ser: 1.59 mg/dL — ABNORMAL HIGH (ref 0.44–1.00)
GFR calc Af Amer: 35 mL/min — ABNORMAL LOW (ref >60)
GFR calc non Af Amer: 30 mL/min — ABNORMAL LOW (ref >60)
Glucose, Bld: 125 mg/dL — ABNORMAL HIGH (ref 70–99)
Potassium: 5.1 mmol/L (ref 3.5–5.1)
Sodium: 139 mmol/L (ref 135–145)
Total Bilirubin: 0.8 mg/dL (ref 0.3–1.2)
Total Protein: 6.1 g/dL — ABNORMAL LOW (ref 6.5–8.1)

## 2018-10-27 LAB — CBC
HCT: 33.7 % — ABNORMAL LOW (ref 36.0–46.0)
Hemoglobin: 10.5 g/dL — ABNORMAL LOW (ref 12.0–15.0)
MCH: 28.2 pg (ref 26.0–34.0)
MCHC: 31.2 g/dL (ref 30.0–36.0)
MCV: 90.6 fL (ref 80.0–100.0)
Platelets: 237 10*3/uL (ref 150–400)
RBC: 3.72 MIL/uL — ABNORMAL LOW (ref 3.87–5.11)
RDW: 15.5 % (ref 11.5–15.5)
WBC: 10.3 10*3/uL (ref 4.0–10.5)
nRBC: 0 % (ref 0.0–0.2)

## 2018-10-27 LAB — URINALYSIS, COMPLETE (UACMP) WITH MICROSCOPIC
Bilirubin Urine: NEGATIVE
Glucose, UA: NEGATIVE mg/dL
Ketones, ur: NEGATIVE mg/dL
Nitrite: NEGATIVE
Protein, ur: NEGATIVE mg/dL
RBC / HPF: >50 RBC/hpf — ABNORMAL HIGH (ref 0–5)
Specific Gravity, Urine: 1.006 (ref 1.005–1.030)
WBC, UA: >50 WBC/hpf — ABNORMAL HIGH (ref 0–5)
pH: 6 (ref 5.0–8.0)

## 2018-10-27 LAB — LIPASE, BLOOD: Lipase: 26 U/L (ref 11–51)

## 2018-10-27 MED ORDER — CEPHALEXIN 500 MG PO CAPS: 500.0000 mg | ORAL_CAPSULE | Freq: Three times a day (TID) | ORAL | 0 refills | Status: DC

## 2018-10-27 MED ORDER — SODIUM CHLORIDE 0.9 % IV SOLN
1.0000 g | Freq: Once | INTRAVENOUS | Status: AC
  Administered 2018-10-27: 1 g via INTRAVENOUS
  Filled 2018-10-27: qty 10

## 2018-10-27 NOTE — ED Notes (Signed)
Pt given soda verbal okay from [place].

## 2018-10-27 NOTE — ED Notes (Addendum)
Attempted 24g IV at L upper arm. Pt agreeable.

## 2018-10-27 NOTE — ED Notes (Signed)
Pt on her home O2 2L. Pt's personal wheelchair brought back to room with her. Pt currently in the recliner.

## 2018-10-27 NOTE — ED Notes (Signed)
Pt continues to rest in recliner; pt denies any needs.

## 2018-10-27 NOTE — ED Notes (Signed)
Pt's husband updated; verbal okay from pt.

## 2018-10-27 NOTE — ED Notes (Signed)
Pt's room door left open; pt agreeable.

## 2018-10-27 NOTE — ED Triage Notes (Signed)
Pt to ED reporting lower abd pain and urinary retention since having an endoscopy performed at [facility] today. Pt denies having had a BM today either. No NV, no fevers.

## 2018-10-27 NOTE — ED Notes (Signed)
Pt given warm blanket x3.

## 2018-10-27 NOTE — ED Notes (Signed)
Pt continues to refuse blood work at this time.

## 2018-10-27 NOTE — ED Notes (Signed)
Pt educated about foley cath.

## 2018-10-27 NOTE — ED Provider Notes (Signed)
[facility] Emergency Department Provider Note   ____________________________________________   I have reviewed the triage vital signs and the nursing notes.   HISTORY  Chief Complaint Abdominal Pain and Urinary Retention   History limited by and level 5 caveat due to dementia, some history obtained over telephone with husband   HPI Amy [name] is a 83 y.o. female who presents to the emergency department today because of concerns for urinary retention.  Patient did undergo an endoscopy earlier today.  Since going home husband states that she has not had any significant urination.  Patient herself is unsure why she is here in the emergency department.   Records reviewed. Per medical record review patient has a history of Alzheimer's, EGD performed today.  Past Medical History:  Diagnosis Date  . Anemia    low iron  . Anxiety   . Arthritis   . Bilateral cataracts   . Bronchitis    hx of   . Cataract   . Chronic kidney disease   . Depression   . Diabetes mellitus without complication ([place])   . 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 GERD (gastroesophageal reflux disease)   . Headache   . History of frequent urinary tract infections   . Hx of renal cell cancer    LEFT  . Hyperlipidemia   . Hypertension   . Numbness and tingling    right hand   . Obesity   . Osteopenia 10/05/2017  . Peripheral artery disease ([place])   . Urinary frequency   . Urinary incontinence     Patient Active Problem List   Diagnosis Date Noted  . Gastric cancer ([place])   . Stomach irritation   . Abnormal finding on GI tract imaging   . Obesity (BMI 30.0-34.9) 09/11/2018  . Abnormal gastrointestinal PET scan 08/27/2018  . Pulmonary nodules 08/27/2018  . Goals of care, counseling/discussion   . Palliative care by specialist   . Hypoxia 07/26/2018  . HCAP (healthcare-associated pneumonia) 07/02/2018  . Hypoxemia   .  COPD exacerbation ([place])   . Renal cell carcinoma ([place])   . Lung nodule 04/23/2018  . Hypertensive renal disease 11/08/2017  . Secondary hyperparathyroidism of renal origin ([place]) 11/08/2017  . QT prolongation 10/27/2017  . Osteopenia 10/05/2017  . Choledocholithiasis 08/13/2017  . Chronic pain syndrome 04/05/2017  . Depression 11/15/2016  . Iron deficiency anemia 09/08/2016  . Anemia 06/29/2016  . Late onset Alzheimer's disease with behavioral disturbance ([place]) 06/14/2016  . S/P AKA (above knee amputation) unilateral, left ([place]) 06/08/2016  . Wheelchair dependent 03/11/2016  . 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 ([place]) 03/13/2015  . S/p nephrectomy 01/29/2015  . Renal neoplasm 01/15/2015  . Abnormal EKG 01/02/2015  . Renal mass 12/05/2014  . GERD (gastroesophageal reflux disease) 11/20/2014  . GAD (generalized anxiety disorder) 11/20/2014  . AB (asthmatic bronchitis) 11/20/2014  . Aortic atherosclerosis ([place]) 11/20/2014  . Cataract 11/20/2014  . Leg pain 11/20/2014  . Continuous opioid dependence ([place]) 11/20/2014  . CKD (chronic kidney disease) stage 4, GFR 15-29 ml/min (HCC) 11/20/2014  . COPD (chronic obstructive pulmonary disease) ([place]) 11/20/2014  . Excessive falling 11/20/2014  . Fatty infiltration of liver 11/20/2014  . Hyperlipidemia 11/20/2014  . Anterior knee pain 11/20/2014  . LBP (low back pain) 11/20/2014  . Malaise and fatigue 11/20/2014  . Abnormal presence of protein in urine 11/20/2014  . PVD (peripheral vascular  disease) ([place]) 11/20/2014  . Allergic rhinitis 11/20/2014  . At risk for falling 11/20/2014  . Phantom limb syndrome with pain ([place]) 11/05/2014  . Anxiety 11/05/2014  . Elevated serum creatinine 11/05/2014  . Diabetes mellitus type 2, controlled ([place]) 11/05/2014  . H/O adenomatous polyp of colon 07/27/2010    Past Surgical History:  Procedure Laterality Date  . ABDOMINAL HYSTERECTOMY  1978  . CARPAL TUNNEL RELEASE Bilateral   .  CHOLECYSTECTOMY    . COLONOSCOPY    . COLONOSCOPY WITH ESOPHAGOGASTRODUODENOSCOPY (EGD)    . ERCP N/A 08/19/2017   Procedure: ENDOSCOPIC RETROGRADE CHOLANGIOPANCREATOGRAPHY (ERCP);  Surgeon: Arta Silence, MD;  Location: [facility] ENDOSCOPY;  Service: Endoscopy;  Laterality: N/A;  . ESOPHAGOGASTRODUODENOSCOPY (EGD) WITH PROPOFOL N/A 10/12/2018   Procedure: ESOPHAGOGASTRODUODENOSCOPY (EGD) WITH PROPOFOL;  Surgeon: Virgel Manifold, MD;  Location: [facility] ENDOSCOPY;  Service: Endoscopy;  Laterality: N/A;  . LEG AMPUTATION     above the knee / left   . ROBOT ASSISTED LAPAROSCOPIC NEPHRECTOMY Left 01/15/2015   Procedure: ROBOTIC ASSISTED LAPAROSCOPIC RADICAL NEPHRECTOMY;  Surgeon: Alexis Frock, MD;  Location: [facility] ORS;  Service: Urology;  Laterality: Left;  . stent placement right leg       Prior to Admission medications   Medication Sig Start Date End Date Taking? Authorizing Provider  Ascorbic Acid (VITAMIN C) 1000 MG tablet Take 1,000 mg by mouth daily.    [provider]  atorvastatin (LIPITOR) 20 MG tablet TAKE 1 TABLET EVERY NIGHT AT BEDTIME 10/17/18   Poulose, Bethel Born, NP  clopidogrel (PLAVIX) 75 MG tablet TAKE 1 TABLET EVERY DAY 09/14/18   Algernon Huxley, MD  donepezil (ARICEPT) 10 MG tablet Take 1 tablet (10 mg total) by mouth at bedtime. 10/06/17   Arnetha Courser, MD  FLUoxetine (PROZAC) 10 MG capsule Take 1 capsule (10 mg total) by mouth daily. 07/22/18   Nicholes Mango, MD  gabapentin (NEURONTIN) 300 MG capsule Take 1 capsule (300 mg total) by mouth 3 (three) times daily. 06/21/18   Gillis Santa, MD  HYDROcodone-acetaminophen (NORCO) 7.5-325 MG tablet Take 1 tablet by mouth every 8 (eight) hours as needed for up to 30 days for severe pain. Must last 30 days. 11/22/18 12/22/18  Gillis Santa, MD  ipratropium-albuterol (DUONEB) 0.5-2.5 (3) MG/3ML SOLN INHALE THE CONTENTS OF 1 VIAL VIA NEBULIZER THREE TIMES DAILY AS DIRECTED Patient taking differently: Inhale 3 mLs into the lungs 3 (three)  times daily.  11/10/17   Laverle Hobby, MD  pantoprazole (PROTONIX) 40 MG tablet Take 1 tablet (40 mg total) by mouth daily. 08/24/18   Arnetha Courser, MD  QUEtiapine (SEROQUEL) 25 MG tablet Take 1 tablet (25 mg total) by mouth See admin instructions. Take 1 tablet in a.m. and 1 tablet in the afternoon and 2 tablets(50 mg total) before bedtime around 8 to 9 PM 07/27/18   Vaughan Basta, MD    Allergies Fentanyl and Penicillins  Family History  Problem Relation Age of Onset  . Cancer Mother        Stomach  . Cancer Father   . Diabetes Brother   . Cancer Brother        oral  . Healthy Son   . Healthy Sister     Social History Social History   Tobacco Use  . Smoking status: Former Smoker    Packs/day: 1.50    Years: 51.00    Pack years: 76.50    Types: Cigarettes    Last attempt to quit: 05/24/2004  Years since quitting: 14.4  . Smokeless tobacco: Never Used  . Tobacco comment: smoking cessation materials not required  Substance Use Topics  . Alcohol use: Not Currently  . Drug use: No    Review of Systems Unable to obtain reliable review of systems secondary to dementia ____________________________________________   PHYSICAL EXAM:  VITAL SIGNS: ED Triage Vitals  Enc Vitals Group     BP 10/27/18 1551 (!) 124/49     Pulse Rate 10/27/18 1551 (!) 102     Resp 10/27/18 1551 20     Temp 10/27/18 1551 97.6 F (36.4 C)     Temp Source 10/27/18 1551 Oral     SpO2 10/27/18 1551 96 %     Weight 10/27/18 1552 160 lb (72.6 kg)     Height 10/27/18 1552 5\' 2"  (1.575 m)     Head Circumference --      Peak Flow --      Pain Score 10/27/18 1552 10   Constitutional: Awake and alert. Not oriented to events.  Eyes: Conjunctivae are normal.  ENT      Head: Normocephalic and atraumatic.      Nose: No congestion/rhinnorhea.      Mouth/Throat: Mucous membranes are moist.      Neck: No stridor. Hematological/Lymphatic/Immunilogical: No cervical  lymphadenopathy. Cardiovascular: Normal rate, regular rhythm.  No murmurs, rubs, or gallops.  Respiratory: Normal respiratory effort without tachypnea nor retractions. Breath sounds are clear and equal bilaterally. No wheezes/rales/rhonchi. Gastrointestinal: Soft and non tender. No rebound. No guarding.  Genitourinary: Deferred Musculoskeletal: Normal range of motion in all extremities. No lower extremity edema. Neurologic:  Dementia.  Skin:  Skin is warm, dry and intact. No rash noted.  ____________________________________________    LABS (pertinent positives/negatives)  UA large, hgb dipstick, large leukocytes, >50 rbc and wbc Lipase 26 CBC wbc 10.3, hgb 10.5, plt 237 CMP na 139, k 5.1, glu 125, cr 1.59 ____________________________________________   EKG  None  ____________________________________________    RADIOLOGY  None  ____________________________________________   PROCEDURES  Procedures  ____________________________________________   INITIAL IMPRESSION / ASSESSMENT AND PLAN / ED COURSE  Pertinent labs & imaging results that were available during my care of the patient were reviewed by me and considered in my medical decision making (see chart for details).   Patient presented to the emergency department today because of concerns for urinary retention.  Patient did undergo an EGD earlier today.  Additionally patient's urine is concerning for urinary tract infection.  Do think both of these could explain the urinary retention.  Patient did have Foley catheter placed.  Will give dose of IV antibiotics here in the emergency department and plan on discharging home with further antibiotics.  Will give urology follow-up information for Foley catheter management.  ____________________________________________   FINAL CLINICAL IMPRESSION(S) / ED DIAGNOSES  Final diagnoses:  Lower urinary tract infectious disease     Note: This dictation was prepared with Dragon  dictation. Any transcriptional errors that result from this process are unintentional     Nance Pear, MD 10/27/18 2310

## 2018-10-27 NOTE — ED Notes (Signed)
600cc of straw colored urine removed from foley cath bag.

## 2018-10-27 NOTE — ED Notes (Signed)
Pt refused bloodwork earlier per first nurse. Will try to collect blood work now if pt will allow this RN to.

## 2018-10-27 NOTE — ED Notes (Signed)
Pt c/o R upper arm pain; this RN to bedside to explain that the pain is coming from the BP cuff. Pt denies arm pain once the cuff is deflated.

## 2018-10-27 NOTE — ED Notes (Signed)
Pt agrees to have this RN try for IV again.

## 2018-10-27 NOTE — ED Notes (Addendum)
Pt alert, anxious but cooperative. TV turned on for pt; remote with pt.

## 2018-10-27 NOTE — ED Notes (Signed)
Pt watching tv; resting in recliner; denies any needs.

## 2018-10-27 NOTE — Telephone Encounter (Signed)
Per Dr. Baruch Goldmann was performed today on Amy [name] DOB 09-26-1935.  EUS staging was T2 N0.  Given T2 status, the mass is not amenable to endoscopic removal.  I re-biopsied the mass today.  She will need to see one of your surgeons for consideration of resection.  Dr. Mike Gip, how would you like to proceed?

## 2018-10-27 NOTE — Discharge Instructions (Signed)
Please seek medical attention for any high fevers, chest pain, shortness of breath, change in behavior, persistent vomiting, bloody stool or any other new or concerning symptoms.  

## 2018-10-30 ENCOUNTER — Encounter: Payer: Self-pay | Admitting: Internal Medicine

## 2018-10-30 ENCOUNTER — Telehealth: Payer: Self-pay | Admitting: Family Medicine

## 2018-10-30 DIAGNOSIS — Z89612 Acquired absence of left leg above knee: Secondary | ICD-10-CM | POA: Diagnosis not present

## 2018-10-30 DIAGNOSIS — Z7951 Long term (current) use of inhaled steroids: Secondary | ICD-10-CM | POA: Diagnosis not present

## 2018-10-30 DIAGNOSIS — J440 Chronic obstructive pulmonary disease with acute lower respiratory infection: Secondary | ICD-10-CM | POA: Diagnosis not present

## 2018-10-30 DIAGNOSIS — N183 Chronic kidney disease, stage 3 (moderate): Secondary | ICD-10-CM | POA: Diagnosis not present

## 2018-10-30 DIAGNOSIS — F0390 Unspecified dementia without behavioral disturbance: Secondary | ICD-10-CM | POA: Diagnosis not present

## 2018-10-30 DIAGNOSIS — E1122 Type 2 diabetes mellitus with diabetic chronic kidney disease: Secondary | ICD-10-CM | POA: Diagnosis not present

## 2018-10-30 DIAGNOSIS — J441 Chronic obstructive pulmonary disease with (acute) exacerbation: Secondary | ICD-10-CM | POA: Diagnosis not present

## 2018-10-30 DIAGNOSIS — J181 Lobar pneumonia, unspecified organism: Secondary | ICD-10-CM | POA: Diagnosis not present

## 2018-10-30 NOTE — Telephone Encounter (Signed)
Verbal given to [facility].

## 2018-10-30 NOTE — Telephone Encounter (Signed)
Please Ok these orders for monitoring until seen by urologist.

## 2018-10-30 NOTE — Telephone Encounter (Signed)
Copied from [place] [id number]. Topic: Quick Communication - See Telephone Encounter >> Oct 30, 2018 12:19 PM Sheran Luz wrote: CRM for notification. See Telephone encounter for: 10/30/18.  Sherri, with [facility], states that patient was to be discharged today, however, due to a recent urgent care trip- patient now has catheter and needs to be monitored until patient is able to see urologist. She is requesting call back from [place] to okay.

## 2018-10-31 ENCOUNTER — Telehealth: Payer: Self-pay

## 2018-10-31 DIAGNOSIS — J449 Chronic obstructive pulmonary disease, unspecified: Secondary | ICD-10-CM | POA: Diagnosis not present

## 2018-10-31 LAB — URINE CULTURE: Culture: >=100000 — AB

## 2018-10-31 NOTE — Telephone Encounter (Signed)
Call placed to Mr. Biss regarding need for surgical referral at tertiary center for consideration of resection. He is very concerned regarding a possible surgery and would like to speak with Dr. Mike Gip further regarding any other options she may have outside of surgery before making referral. Dr. Mike Gip notified.

## 2018-11-01 ENCOUNTER — Ambulatory Visit: Payer: Medicare HMO | Admitting: Gastroenterology

## 2018-11-01 DIAGNOSIS — J9601 Acute respiratory failure with hypoxia: Secondary | ICD-10-CM | POA: Diagnosis not present

## 2018-11-01 NOTE — Progress Notes (Signed)
Case discussed with Dr. Cinda Quest. Positive urine culture for morganela morganii - sensitive to ciprofloxacin. Ordered cipro 250 mg BID x 3 [facility]. Patient was called and notified to pick up prescription today or tomorrow.   Eleonore Chiquito, PharmD, BCPS.

## 2018-11-02 ENCOUNTER — Encounter: Payer: Self-pay | Admitting: Urology

## 2018-11-02 ENCOUNTER — Ambulatory Visit (INDEPENDENT_AMBULATORY_CARE_PROVIDER_SITE_OTHER): Payer: Medicare HMO | Admitting: Urology

## 2018-11-02 ENCOUNTER — Other Ambulatory Visit: Payer: Self-pay

## 2018-11-02 ENCOUNTER — Telehealth: Payer: Self-pay | Admitting: Family Medicine

## 2018-11-02 VITALS — BP 100/68 | HR 92 | Ht 62.0 in | Wt 158.0 lb

## 2018-11-02 DIAGNOSIS — R339 Retention of urine, unspecified: Secondary | ICD-10-CM

## 2018-11-02 NOTE — Chronic Care Management (AMB) (Deleted)
°  Chronic Care Management   Outreach Note  11/02/2018 Name: Amy [name] MRN: [id number] DOB: 10/07/1935  Referred by: Arnetha Courser, MD Reason for referral : Chronic Care Management (Initial CCM outreach was unsuccessful )   An unsuccessful telephone outreach was attempted today. The patient was referred to the case management team by for assistance with chronic care management and care coordination.   Follow Up Plan: The care management team will reach out to the patient again over the next 7 days.   [place]  ??[email or web address]   ??[phone]

## 2018-11-02 NOTE — Progress Notes (Signed)
11/02/2018 9:41 AM   Chales Abrahams July 14, 1935 [id number]  Referring provider: Arnetha Courser, MD [address] [facility],  [place] [address]  Chief Complaint  Patient presents with  . Urinary Retention    HPI: 83 year old female presents for follow-up of a recent ED visit for urinary retention.  She had an EGD at [facility] on 10/27/2018 and presented to the [facility] ED that evening and urinary retention.  Her husband is with her today and states she had an episode of urinary retention after leg amputation years ago.  600 cc of urine was obtained when her catheter was placed.  Her urine culture grew Morganella however she was asymptomatic.  She was seen here in 2016 by Dr. Erlene Quan for a left renal mass and subsequently underwent a left nephrectomy by Dr. Tresa Moore in [place].  Her husband states the renal mass was cancerous but it is unclear when her last follow-up was.  She has no complaints today.   PMH: Past Medical History:  Diagnosis Date  . Anemia    low iron  . Anxiety   . Arthritis   . Bilateral cataracts   . Bronchitis    hx of   . Cataract   . Chronic kidney disease   . Depression   . Diabetes mellitus without complication ([place])   . 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 GERD (gastroesophageal reflux disease)   . Headache   . History of frequent urinary tract infections   . Hx of renal cell cancer    LEFT  . Hyperlipidemia   . Hypertension   . Numbness and tingling    right hand   . Obesity   . Osteopenia 10/05/2017  . Peripheral artery disease ([place])   . Urinary frequency   . Urinary incontinence     Surgical History: Past Surgical History:  Procedure Laterality Date  . ABDOMINAL HYSTERECTOMY  1978  . CARPAL TUNNEL RELEASE Bilateral   . CHOLECYSTECTOMY    . COLONOSCOPY    . COLONOSCOPY WITH ESOPHAGOGASTRODUODENOSCOPY (EGD)    . ERCP N/A 08/19/2017   Procedure: ENDOSCOPIC RETROGRADE  CHOLANGIOPANCREATOGRAPHY (ERCP);  Surgeon: Arta Silence, MD;  Location: [facility] ENDOSCOPY;  Service: Endoscopy;  Laterality: N/A;  . ESOPHAGOGASTRODUODENOSCOPY (EGD) WITH PROPOFOL N/A 10/12/2018   Procedure: ESOPHAGOGASTRODUODENOSCOPY (EGD) WITH PROPOFOL;  Surgeon: Virgel Manifold, MD;  Location: [facility] ENDOSCOPY;  Service: Endoscopy;  Laterality: N/A;  . LEG AMPUTATION     above the knee / left   . ROBOT ASSISTED LAPAROSCOPIC NEPHRECTOMY Left 01/15/2015   Procedure: ROBOTIC ASSISTED LAPAROSCOPIC RADICAL NEPHRECTOMY;  Surgeon: Alexis Frock, MD;  Location: [facility] ORS;  Service: Urology;  Laterality: Left;  . stent placement right leg       Home Medications:  Allergies as of 11/02/2018      Reactions   Fentanyl Other (See Comments)   urine retention   Penicillins Itching, Rash, Other (See Comments)   Has patient had a PCN reaction causing immediate rash, facial/tongue/throat swelling, SOB or lightheadedness with hypotension: Yes Has patient had a PCN reaction causing severe rash involving mucus membranes or skin necrosis: No Has patient had a PCN reaction that required hospitalization: No Has patient had a PCN reaction occurring within the last 10 years: Yes If all of the above answers are "NO", then may proceed with Cephalosporin use.      Medication List       Accurate  as of November 02, 2018  9:41 AM. If you have any questions, ask your nurse or doctor.        atorvastatin 20 MG tablet Commonly known as: LIPITOR TAKE 1 TABLET EVERY NIGHT AT BEDTIME   cephALEXin 500 MG capsule Commonly known as: KEFLEX Take 1 capsule (500 mg total) by mouth 3 (three) times daily.   clopidogrel 75 MG tablet Commonly known as: PLAVIX TAKE 1 TABLET EVERY DAY   COLLAGEN PO Take 2,500 mg by mouth.   donepezil 10 MG tablet Commonly known as: ARICEPT Take 1 tablet (10 mg total) by mouth at bedtime.   famotidine 20 MG tablet Commonly known as: PEPCID Take by mouth.   FLUoxetine 10 MG capsule  Commonly known as: PROZAC Take 1 capsule (10 mg total) by mouth daily.   gabapentin 300 MG capsule Commonly known as: NEURONTIN Take 1 capsule (300 mg total) by mouth 3 (three) times daily.   HYDROcodone-acetaminophen 7.5-325 MG tablet Commonly known as: Norco Take 1 tablet by mouth every 8 (eight) hours as needed for up to 30 days for severe pain. Must last 30 days. Start taking on: November 22, 2018   ipratropium-albuterol 0.5-2.5 (3) MG/3ML Soln Commonly known as: DUONEB INHALE THE CONTENTS OF 1 VIAL VIA NEBULIZER THREE TIMES DAILY AS DIRECTED What changed: See the new instructions.   pantoprazole 40 MG tablet Commonly known as: PROTONIX Take 1 tablet (40 mg total) by mouth daily.   QUEtiapine 25 MG tablet Commonly known as: SEROQUEL Take 1 tablet (25 mg total) by mouth See admin instructions. Take 1 tablet in a.m. and 1 tablet in the afternoon and 2 tablets(50 mg total) before bedtime around 8 to 9 PM   Ventolin HFA 108 (90 Base) MCG/ACT inhaler Generic drug: albuterol Inhale into the lungs.   vitamin C 1000 MG tablet Take 1,000 mg by mouth daily.       Allergies:  Allergies  Allergen Reactions  . Fentanyl Other (See Comments)    urine retention  . Penicillins Itching, Rash and Other (See Comments)    Has patient had a PCN reaction causing immediate rash, facial/tongue/throat swelling, SOB or lightheadedness with hypotension: Yes Has patient had a PCN reaction causing severe rash involving mucus membranes or skin necrosis: No Has patient had a PCN reaction that required hospitalization: No Has patient had a PCN reaction occurring within the last 10 years: Yes If all of the above answers are "NO", then may proceed with Cephalosporin use.    Family History: Family History  Problem Relation Age of Onset  . Cancer Mother        Stomach  . Cancer Father   . Diabetes Brother   . Cancer Brother        oral  . Healthy Son   . Healthy Sister     Social History:   reports that she quit smoking about 14 years ago. Her smoking use included cigarettes. She has a 76.50 pack-year smoking history. She has never used smokeless tobacco. She reports previous alcohol use. She reports that she does not use drugs.  ROS: UROLOGY Frequent Urination?: No Hard to postpone urination?: No Burning/pain with urination?: No Get up at night to urinate?: No Leakage of urine?: No Urine stream starts and stops?: No Trouble starting stream?: No Do you have to strain to urinate?: No Blood in urine?: No Urinary tract infection?: No Sexually transmitted disease?: No Injury to kidneys or bladder?: No Painful intercourse?: No Weak stream?: No Currently  pregnant?: No Vaginal bleeding?: No Last menstrual period?: Hysterectomy  Gastrointestinal Nausea?: No Vomiting?: No Indigestion/heartburn?: No Diarrhea?: No Constipation?: No  Constitutional Fever: No Night sweats?: No Weight loss?: No Fatigue?: Yes  Skin Skin rash/lesions?: No Itching?: No  Eyes Blurred vision?: No Double vision?: No  Ears/Nose/Throat Sore throat?: No Sinus problems?: Yes  Hematologic/Lymphatic Swollen glands?: No Easy bruising?: Yes  Cardiovascular Leg swelling?: Yes Chest pain?: No  Respiratory Cough?: No Shortness of breath?: Yes  Endocrine Excessive thirst?: No  Musculoskeletal Back pain?: Yes Joint pain?: No  Neurological Headaches?: Yes Dizziness?: No  Psychologic Depression?: Yes Anxiety?: Yes  Physical Exam: BP 100/68 (BP Location: Left Arm, Patient Position: Sitting, Cuff Size: Large)   Pulse 92   Ht 5\' 2"  (1.575 m)   Wt 158 lb (71.7 kg)   BMI 28.90 kg/m   Constitutional:  Alert, No acute distress. HEENT: [place] AT, moist mucus membranes.  Trachea midline, no masses. Cardiovascular: No clubbing, cyanosis, or edema. Respiratory: Normal respiratory effort, no increased work of breathing. GI: Abdomen is soft, nontender, nondistended, no abdominal masses  GU: No CVA tenderness.  Urine clear Skin: No rashes, bruises or suspicious lesions. Neurologic: Grossly intact    Assessment & Plan:   83 year old female with a transient episode of urinary retention after sedation.  Foley catheter was removed and she was instructed to call for any voiding difficulty.  Will check with [facility] in [place] to see when her last follow-up was there and if she is scheduled for cancer follow-up.  Abbie Sons, [place] [address], [place], [place] [address] [phone]

## 2018-11-03 ENCOUNTER — Other Ambulatory Visit: Payer: Self-pay

## 2018-11-03 DIAGNOSIS — E1122 Type 2 diabetes mellitus with diabetic chronic kidney disease: Secondary | ICD-10-CM | POA: Diagnosis not present

## 2018-11-03 DIAGNOSIS — N183 Chronic kidney disease, stage 3 (moderate): Secondary | ICD-10-CM | POA: Diagnosis not present

## 2018-11-03 DIAGNOSIS — F0390 Unspecified dementia without behavioral disturbance: Secondary | ICD-10-CM | POA: Diagnosis not present

## 2018-11-03 DIAGNOSIS — Z7951 Long term (current) use of inhaled steroids: Secondary | ICD-10-CM | POA: Diagnosis not present

## 2018-11-03 DIAGNOSIS — N390 Urinary tract infection, site not specified: Secondary | ICD-10-CM | POA: Diagnosis not present

## 2018-11-03 DIAGNOSIS — Z89612 Acquired absence of left leg above knee: Secondary | ICD-10-CM | POA: Diagnosis not present

## 2018-11-03 DIAGNOSIS — J441 Chronic obstructive pulmonary disease with (acute) exacerbation: Secondary | ICD-10-CM | POA: Diagnosis not present

## 2018-11-05 ENCOUNTER — Other Ambulatory Visit: Payer: Self-pay | Admitting: Hematology and Oncology

## 2018-11-05 ENCOUNTER — Encounter: Payer: Self-pay | Admitting: Urology

## 2018-11-05 DIAGNOSIS — E538 Deficiency of other specified B group vitamins: Secondary | ICD-10-CM | POA: Insufficient documentation

## 2018-11-05 NOTE — Progress Notes (Signed)
[facility]  [address], [address] [place], [place] [address] Phone: [phone]  Fax: [phone]   Clinic Day:  11/06/2018  Referring physician: Arnetha Courser, MD  Chief Complaint: Amy [name] is a 83 y.o. female with clinical stage T2N0 gastric adenocarcinoma (2020), a history of stage III renal cell carcinoma (2016), and iron deficiency anemia who is seen for review of interval endoscopic ultrasound and discussion regarding direction of therapy.   HPI: The patient was last seen in the medical oncology clinic on 10/20/2018. At that time, she felt "fine".  Weight was stable.  She denied any pain.  She was seen in gastroenterology on 10/23/2018 by Dr. Bonna Gains via telephone. EGD had revealed a mass in the gastric cardia.  Biopsy was positive for adenocarcinoma.  EUS was scheduled at [facility].  Colonoscopy for a polyp was recommended for 6-12 months. Six month follow-up was scheduled.   Labs on 10/24/2018: WBC 9,200, hemoglobin 10.5, hematocrit 34.3, platelets 242,000. Creatinine was 2.0. Ferritin was 19 with an iron saturation of 8%. B12 was 197.  Folate was 5.5.  She was started on oral iron and a B-complex vitamin last week.  EUS on 10/27/2018 at [place] with Dr. Tillie Rung revealed a mass along the greater curvature of the proximal gastric body. Staging was T2 N0.  The mass was not amenable to endoscopic removal.  It was re-biopsied. Pathology revealed at least intramucosal adenocarcinoma arising in a background of intestinal metaplasia and low- and high-grade dysplasia.  She presented to the [facility] ED on 10/27/2018 for lower abdominal pain and urinary retention. She had a UTI secondary to Foley catheter placement and was given IV antibiotics and discharged with ciprofloxacin 282m x3 days.   She was seen in urology on 11/02/2018 by Dr. SJohn Giovanni Her catheter was removed and she was told to call with any difficulty urinating.   During the  interim, she denies any new symptoms.  She feels "ok".  She denies any abdominal pain, melena or hematochezia.    Past Medical History:  Diagnosis Date   Anemia    low iron   Anxiety    Arthritis    Bilateral cataracts    Bronchitis    hx of    Cataract    Chronic kidney disease    Depression    Diabetes mellitus without complication (HCC)    DVT (deep venous thrombosis) (HCC)    hx of in left leg currently has left AKA    GERD (gastroesophageal reflux disease)    Headache    History of frequent urinary tract infections    Hx of renal cell cancer    LEFT   Hyperlipidemia    Hypertension    Numbness and tingling    right hand    Obesity    Osteopenia 10/05/2017   Peripheral artery disease (HCC)    Urinary frequency    Urinary incontinence     Past Surgical History:  Procedure Laterality Date   ABDOMINAL HYSTERECTOMY  1978   CARPAL TUNNEL RELEASE Bilateral    CHOLECYSTECTOMY     COLONOSCOPY     COLONOSCOPY WITH ESOPHAGOGASTRODUODENOSCOPY (EGD)     ERCP N/A 08/19/2017   Procedure: ENDOSCOPIC RETROGRADE CHOLANGIOPANCREATOGRAPHY (ERCP);  Surgeon: OArta Silence MD;  [facility];  Service: Endoscopy;  Laterality: N/A;   ESOPHAGOGASTRODUODENOSCOPY (EGD) WITH PROPOFOL N/A 10/12/2018   Procedure: ESOPHAGOGASTRODUODENOSCOPY (EGD) WITH PROPOFOL;  Surgeon: TVirgel Manifold MD;  Location: [facility] ENDOSCOPY;  Service: Endoscopy;  Laterality: N/A;   LEG AMPUTATION     above the knee / left    ROBOT ASSISTED LAPAROSCOPIC NEPHRECTOMY Left 01/15/2015   Procedure: ROBOTIC ASSISTED LAPAROSCOPIC RADICAL NEPHRECTOMY;  Surgeon: Alexis Frock, MD;  Location: [facility] ORS;  Service: Urology;  Laterality: Left;   stent placement right leg       Family History  Problem Relation Age of Onset   Cancer Mother        Stomach   Cancer Father    Diabetes Brother    Cancer Brother        oral   Healthy Son    Healthy Sister     Social History:   reports that she quit smoking about 14 years ago. Her smoking use included cigarettes. She has a 76.50 pack-year smoking history. She has never used smokeless tobacco. She reports previous alcohol use. She reports that she does not use drugs. She is an Therapist, sports. She lives in [place]. Herhusband's name isRaymond. She is accompanied by her husband via [employer] today, who is her primary historian do to her dementia.     Allergies:  Allergies  Allergen Reactions   Fentanyl Other (See Comments)    urine retention   Penicillins Itching, Rash and Other (See Comments)    Has patient had a PCN reaction causing immediate rash, facial/tongue/throat swelling, SOB or lightheadedness with hypotension: Yes Has patient had a PCN reaction causing severe rash involving mucus membranes or skin necrosis: No Has patient had a PCN reaction that required hospitalization: No Has patient had a PCN reaction occurring within the last 10 years: Yes If all of the above answers are "NO", then may proceed with Cephalosporin use.    Current Medications: Current Outpatient Medications  Medication Sig Dispense Refill   acetaminophen (TYLENOL) 325 MG tablet Take 325 mg by mouth every 6 (six) hours as needed.     albuterol (VENTOLIN HFA) 108 (90 Base) MCG/ACT inhaler Inhale 1-2 puffs into the lungs every 4 (four) hours as needed.      Ascorbic Acid (VITAMIN C) 1000 MG tablet Take 1,000 mg by mouth daily.     atorvastatin (LIPITOR) 20 MG tablet TAKE 1 TABLET EVERY NIGHT AT BEDTIME 90 tablet 0   ciprofloxacin (CIPRO) 250 MG tablet Take 250 mg by mouth 2 (two) times daily.      clopidogrel (PLAVIX) 75 MG tablet TAKE 1 TABLET EVERY DAY 90 tablet 3   donepezil (ARICEPT) 10 MG tablet Take 1 tablet (10 mg total) by mouth at bedtime. 90 tablet 3   famotidine (PEPCID) 20 MG tablet Take 20 mg by mouth daily.      FLUoxetine (PROZAC) 10 MG capsule Take 1 capsule (10 mg total) by mouth daily. 30 capsule 0   gabapentin (NEURONTIN)  300 MG capsule Take 1 capsule (300 mg total) by mouth 3 (three) times daily. 270 capsule 3   [START ON 11/22/2018] HYDROcodone-acetaminophen (NORCO) 7.5-325 MG tablet Take 1 tablet by mouth every 8 (eight) hours as needed for up to 30 days for severe pain. Must last 30 days. 90 tablet 0   ipratropium-albuterol (DUONEB) 0.5-2.5 (3) MG/3ML SOLN INHALE THE CONTENTS OF 1 VIAL VIA NEBULIZER THREE TIMES DAILY AS DIRECTED (Patient taking differently: Inhale 3 mLs into the lungs 3 (three) times daily. ) 810 mL 2   pantoprazole (PROTONIX) 40 MG tablet Take 1 tablet (40 mg total) by mouth daily. 90 tablet 0   QUEtiapine (SEROQUEL) 25 MG tablet Take 1 tablet (25 mg total)  by mouth See admin instructions. Take 1 tablet in a.m. and 1 tablet in the afternoon and 2 tablets(50 mg total) before bedtime around 8 to 9 PM 150 tablet 0   No current facility-administered medications for this visit.     Review of Systems  Constitutional: Positive for malaise/fatigue. Negative for chills, diaphoresis, fever and weight loss (no new weight).       Feels "ok".  HENT: Positive for hearing loss. Negative for congestion, ear pain, nosebleeds, sinus pain and sore throat.   Eyes: Negative.  Negative for blurred vision, double vision, photophobia and pain.  Respiratory: Positive for cough (chronic) and shortness of breath (on exertion). Negative for hemoptysis, sputum production and wheezing.   Cardiovascular: Negative.  Negative for chest pain, palpitations, orthopnea, leg swelling and PND.  Gastrointestinal: Negative for abdominal pain, blood in stool, constipation, diarrhea, heartburn, melena, nausea and vomiting.       Eating "fine".  Genitourinary: Negative.  Negative for dysuria, frequency, hematuria and urgency.  Musculoskeletal: Negative.  Negative for back pain, joint pain and myalgias.  Skin: Negative.  Negative for itching and rash.  Neurological: Positive for weakness (generalized). Negative for dizziness,  tingling, sensory change, speech change, focal weakness and headaches.  Endo/Heme/Allergies: Negative.  Does not bruise/bleed easily.  Psychiatric/Behavioral: Positive for memory loss (dementia). Negative for depression. The patient is not nervous/anxious and does not have insomnia.   All other systems reviewed and are negative.  Performance status (ECOG): 2-3  Physical Exam  Constitutional: No distress.  Chronically ill appearing sitting comfortably in a wheelchair in no acute distress.  HENT:  Head: Normocephalic and atraumatic.  Mouth/Throat: Oropharynx is clear and moist. No oropharyngeal exudate.  Short gray hair.  Eyes: Pupils are equal, round, and reactive to light. Conjunctivae and EOM are normal. No scleral icterus.  Glasses.  Brown eyes.  Neck: Normal range of motion. Neck supple. No JVD present.  Cardiovascular: Normal rate and regular rhythm. Exam reveals no gallop.  No murmur heard. Pulmonary/Chest: Effort normal and breath sounds normal. No respiratory distress. She has no wheezes. She has no rales.  Abdominal: Soft. Bowel sounds are normal. She exhibits no distension and no mass. There is no abdominal tenderness. There is no rebound and no guarding.  Musculoskeletal: Normal range of motion.        General: No tenderness or edema.     Comments: Left AKA.  Lymphadenopathy:    She has no cervical adenopathy.  Neurological: She is alert.  Skin: Skin is warm and dry. No rash noted. She is not diaphoretic. No erythema. No pallor.  Psychiatric: She has a normal mood and affect. Judgment normal.  Dementia.  Nursing note and vitals reviewed.   Imaging studies: 11/29/2014: Abdomen and pelvic CT revealed a 5.5 x 3.7 solid enhancing mass arising from the upper pole of the left kidney. There was evidence of renal vein invasion. There were borderline enlarged periaortic lymph node (9 mm).  08/16/2016: Chest, abdomen, and pelvic CTrevealed left nephrectomy without evidence of  metastatic disease. There were low-attenuation lesions in the right kidney, likely cysts although definitive characterization is limited without post-contrast imaging. There was an ectatic abdominal aorta at risk for aneurysm development.  02/16/2017: Chest, abdomen and pelvic CTrevealed no evidence of recurrent disease. There was a 7 x 5 mm right upper lobe pulmonary nodule (previously 6 x 4 mm). There was a 3 mm (previously 1-2 mm) left lower lobe pulmonary nodule. 08/12/2017: Chest CT revealed no active pulmonary disease.  Mild centrilobular emphysema. There was stable appearing pleural-based nodular opacity in the anterior aspect of the right upper lobe measuring 5.6 mm. There was stable 4 mm medial left upper lobe pleural-based opacity is unchanged.  08/12/2017: Abdomen and pelvic CTrevealed a 9 mm hyperdensity along the expected location of the distal common bile duct near the ampulla with dilatation of the CBD to 17 mm. Simple renal cysts of the right kidney were noted with a 1.1 cm hyperdense complex appearing lesion possibly representing a hemorrhagic or proteinaceous cyst. No worrisome features were identified with respect of this complex lesion.  08/14/2017: MR abdomen MRCPrevealed a solitary 9 mm choledocholith in the mid to upper common bile duct. There was mild diffuse intrahepatic biliary ductal dilatation with dilated CBD (13 mm diameter). There was indeterminate small 1.2 cm renal cortical mass in the posterior lower right kidney, incompletely characterized on this noncontrast MRI study, renal neoplasm not excluded. Recommend attention on follow-up MRI (preferred) or CT abdomen without and with IV contrast in 6 months. There was ectatic 2.7 cm infrarenal abdominal aorta. Ectatic abdominal aorta at risk for aneurysm development. Recommend followup by ultrasound in 5 years. 02/13/2018: Chest, abdomen, and pelvic CTrevealed interval growth of 8 mm solid medial left lower lobe  pulmonary nodule, cannot exclude enlarging pulmonary metastasis. This nodule was slightly below PET resolution. There was interval growth of 9 mm medial left upper lobe ground-glass pulmonary nodule. There were no additional potential findings of metastatic disease. There was no evidence of local tumor recurrence in the left nephrectomy bed. There was an ectatic 2.8 cm infrarenal abdominal aorta, stable at risk for aneurysm development.  05/26/2018: Chest CTrevealed further enlargement of part solid left upper lobe nodule, potentially adenocarcinoma. Left upper lobe measured 12 x 8 mm (previously 9 mm maximally). The left lower lobe solid nodule had not significantly changed over the last 3 months, although had enlarged over the last 10 months and could reflect a metastasis. There was new mild right paratracheal lymphadenopathy. Consider PET-CT for further evaluation. Alternatively, continued short-term CT follow-up could be performed. 08/24/2018: PET scanrevealed a2.5 cmpotential lesion in the stomach just below the GE junction has a maximum SUV of 16.2. Therewasalso some accentuated activity in left upper quadrant jejunal loop. There werea variety of mildly enlarged lymph nodes, some with activity mildly above the background blood pool. Most of the pulmonary nodules hadlow-grade activity (benign versuslow-grade adenocarcinoma). The airspace opacity posteriorly in the aerated portion of the right lower lobe hadlow activity, probably reflecting rounded atelectasis. There wasa bandlike subpleural density in the superior segment right lower lobe whichwasmildly hypermetabolic (SUV 3.1), but with a sub solid appearance and contour atypical for malignancy.There was a small right pleural effusionthat hadaccentuated activity suggesting possible exudative effusion. There was accentuated activity in the left eighth rib laterally(SUV 2.9), favorsmall healing nondisplaced rib fracture whichwas  otherwise occult.   No visits with results within 3 Day(s) from this visit.  Latest known visit with results is:  Admission on 10/27/2018, Discharged on 10/27/2018  Component Date Value Ref Range Status   Lipase 10/27/2018 26  11 - 51 U/L Final   Performed at [facility], [place], [place] [address]   Sodium 10/27/2018 139  135 - 145 mmol/L Final   Potassium 10/27/2018 5.1  3.5 - 5.1 mmol/L Final   Chloride 10/27/2018 109  98 - 111 mmol/L Final   CO2 10/27/2018 22  22 - 32 mmol/L Final   Glucose, Bld 10/27/2018 125* 70 -  99 mg/dL Final   BUN 10/27/2018 16  8 - 23 mg/dL Final   Creatinine, Ser 10/27/2018 1.59* 0.44 - 1.00 mg/dL Final   Calcium 10/27/2018 8.4* 8.9 - 10.3 mg/dL Final   Total Protein 10/27/2018 6.1* 6.5 - 8.1 g/dL Final   Albumin 10/27/2018 3.0* 3.5 - 5.0 g/dL Final   AST 10/27/2018 18  15 - 41 U/L Final   ALT 10/27/2018 11  0 - 44 U/L Final   Alkaline Phosphatase 10/27/2018 89  38 - 126 U/L Final   Total Bilirubin 10/27/2018 0.8  0.3 - 1.2 mg/dL Final   GFR calc non Af Amer 10/27/2018 30* >60 mL/min Final   GFR calc Af Amer 10/27/2018 35* >60 mL/min Final   Anion gap 10/27/2018 8  5 - 15 Final   Performed at [facility], [place], [place] [address]   WBC 10/27/2018 10.3  4.0 - 10.5 K/uL Final   RBC 10/27/2018 3.72* 3.87 - 5.11 MIL/uL Final   Hemoglobin 10/27/2018 10.5* 12.0 - 15.0 g/dL Final   HCT 10/27/2018 33.7* 36.0 - 46.0 % Final   MCV 10/27/2018 90.6  80.0 - 100.0 fL Final   MCH 10/27/2018 28.2  26.0 - 34.0 pg Final   MCHC 10/27/2018 31.2  30.0 - 36.0 g/dL Final   RDW 10/27/2018 15.5  11.5 - 15.5 % Final   Platelets 10/27/2018 237  150 - 400 K/uL Final   nRBC 10/27/2018 0.0  0.0 - 0.2 % Final   Performed at [facility], [place], [place] [address]   Color, Urine 10/27/2018 YELLOW* YELLOW Final   APPearance 10/27/2018 CLOUDY* CLEAR Final   Specific  Gravity, Urine 10/27/2018 1.006  1.005 - 1.030 Final   pH 10/27/2018 6.0  5.0 - 8.0 Final   Glucose, UA 10/27/2018 NEGATIVE  NEGATIVE mg/dL Final   Hgb urine dipstick 10/27/2018 LARGE* NEGATIVE Final   Bilirubin Urine 10/27/2018 NEGATIVE  NEGATIVE Final   Ketones, ur 10/27/2018 NEGATIVE  NEGATIVE mg/dL Final   Protein, ur 10/27/2018 NEGATIVE  NEGATIVE mg/dL Final   Nitrite 10/27/2018 NEGATIVE  NEGATIVE Final   Leukocytes,Ua 10/27/2018 LARGE* NEGATIVE Final   RBC / HPF 10/27/2018 >50* 0 - 5 RBC/hpf Final   WBC, UA 10/27/2018 >50* 0 - 5 WBC/hpf Final   Bacteria, UA 10/27/2018 RARE* NONE SEEN Final   Squamous Epithelial / LPF 10/27/2018 0-5  0 - 5 Final   WBC Clumps 10/27/2018 PRESENT   Final   Performed at [facility], [address] [place], [place] [address]   Specimen Description 10/27/2018    Final                   Value:URINE, RANDOM Performed at [facility], [address] [place], [place] [address]    Special Requests 10/27/2018    Final                   Value:NONE Performed at [facility], [address] [place], [place] [address]    Culture 10/27/2018 >=100,000 COLONIES/mL [facility] MORGANII*  Final   Report Status 10/27/2018 10/31/2018 FINAL   Final   Organism ID, Bacteria 10/27/2018 MORGANELLA MORGANII*  Final    Assessment:  Amy [name] is a 83 y.o. female witha history of stage III renal cell carcinoma and recently diagnosed gastric carcinoma.  She has a history of stage IIIrenal cell carcinomas/p robotic-assisted laparoscopic left radical nephrectomyon 01/09/2015. Pathology revealed a 6.3 cm  renal cell carcinoma. Tumor was predominantly clear cell with a minor component of type I papillary. Fuhrman grade 3 of 4. Tumor extended into the renal vein. There was focal extension into the perirenal and sinus adipose tissue. Margins were negative. There was metastatic tumor in 2 of 2 lymph nodes. Pathologic  stagewas T3aN1Mx.   PET scanon 08/24/2018 revealed a2.5 cmpotential lesion in the stomachjust below the GE junction has a maximum SUV of 16.2. There wasalso some accentuated activity in left upper quadrant jejunal loop. There werea variety of mildly enlarged lymph nodes, some with activity mildly above the background blood pool. Most of the pulmonary noduleshadlow-grade activity (benign versuslow-grade adenocarcinoma). The airspace opacity posteriorly in the aerated portion of the right lower lobe hadlow activity, probably reflecting rounded atelectasis. There wasa bandlike subpleural density in the superior segment right lower lobe whichwasmildly hypermetabolic (SUV 3.1), but with a sub solid appearance and contour atypical for malignancy.There was a small right pleural effusionthat hadaccentuated activity suggesting possible exudative effusion. There was accentuated activity in theleft eighth riblaterally (SUV 2.9), favorsmall healing nondisplaced rib fracture whichwas otherwise occult.  EGD on 10/12/2018 revealed normal gastroesophageal junction and esophagus. There was a gastric tumor in the cardia. There was erythematous mucosa in the antrum. Examined duodenum was normal.  Pathology from the cardia revealed adenocarcinoma, moderately differentiated intestinal type, at least intramucosal, and intestinal metaplasia in the stomach mass. There was also antral and oxyntic mucosa with mild chronic inactive gastritis, negative for H pylori, intestinal metaplasia, dysplasia, or malignancy.   EUS at [facility] on 06/05/2020revealed a mass along the greater curvature of the proximal gastric body. Staging was T2 N0.  The mass was not amenable to endoscopic removal.  Pathology revealed at least intramucosal adenocarcinoma arising in a background of intestinal metaplasia and low- and high-grade dysplasia.  She underwentERCPon 08/19/2017 for chloedocholithiasis. One pancreatic stent was  placed into the ventral pancreatic duct. A filling defect consistent with a stone was seen on the cholangiogram. The entire main bile duct and common hepatic duct were moderately dilated. Choledocholithiasis was found. Complete removal was accomplished by biliary sphincterotomy and basket extraction.  She has iron deficiency anemia. Ferritin was 18 on 08/10/2016, 22 on 09/07/2016, 69 on 12/07/2016, 81 on 02/23/2017, 67 on 08/24/2017, 50 on 02/03/2018, 22 on 03/01/2018, and 25 on 05/31/2018.  She denies pica.  She is on oral iron.  She has chronic renal insufficiency. Creatinine was 1.86 (CrCl 26 ml/min) on 06/15/2016 and 1.59 on 10/27/2018.  CrCl is 26 ml/min.  She has a history of left lower extremity DVT. She is s/p left AKA.  Symptomatically, she denies any abdominal symptoms.  Exam is stable.  Plan: 1.  Gastric carcinoma             Patient is s/p EGD with moderately differentiated gastric adenocarcinoma in the cardia.             Discuss follow-up endoscopic ultrasound.  Patient has clinic T2N0 (stage I) gastric cancer.   She is no amendable to endoscopic resection.   Discuss typical referral for gastric resection.   Concern that patient is not a candidate for surgical resection secondary to age and other underlying medical problems.   Refer for surgical evaluation at [facility].  Discuss other treatment options   Palliative radiation.   Palliative chemotherapy with 5FU.  She is not a candidate for oxaliplatin secondary to CrCl.   Consider PDL-therapy if progressive disease in future if (MMR/MSI or PDL-1 +).   Best  supportive care.            Send tumor for Her2/neu, PD-L1, MMR/MSI.  Patient and her husband wish to have a surgical consultation at [facility].   Patient's husband would like to understand her risks with surgery. 2. Stage III renal cell carcinoma No evidence recurrent disease on imaging. Continue surveillance. 3. Pulmonary  nodules Etiology of pulmonary nodules remains unclear.  Nodules have low grade activity (benign or low grade adenocarcinoma). Review plan for follow-up chest CT this summer. 4.   B12 deficiency and folate deficiency  B12 was 197 (low) and folate was 5.5 (low) on outside labs.   She has been started on a B complex vitamin last week.  Follow-up levels in 1 month. 6.   Iron deficiency  Patient started on oral iron.  If intolerant to oral iron or does not improve, consider IV iron 7.   Contact Mariea Clonts re: [facility] surgical consult. 8.   Radiation oncology consult. 9.   Present at tumor board on 11/09/2018. 10.   RTC after surgery and radiation oncology consultation.   I discussed the assessment and treatment plan with the patient.  The patient was provided an opportunity to ask questions and all were answered.  The patient agreed with the plan and demonstrated an understanding of the instructions.  The patient was advised to call back if the symptoms worsen or if the condition fails to improve as anticipated.  Lequita Asal, MD, PhD    11/06/2018, 1:44 PM  I, Molly Dorshimer, am acting as Education administrator for [employer]. Mike Gip, MD, PhD.  I, Mileah Hemmer C. Mike Gip, MD, have reviewed the above documentation for accuracy and completeness, and I agree with the above.

## 2018-11-06 ENCOUNTER — Other Ambulatory Visit: Payer: Self-pay

## 2018-11-06 ENCOUNTER — Inpatient Hospital Stay: Payer: Medicare HMO | Attending: Hematology and Oncology | Admitting: Hematology and Oncology

## 2018-11-06 ENCOUNTER — Encounter: Payer: Self-pay | Admitting: Hematology and Oncology

## 2018-11-06 VITALS — BP 137/82 | HR 83 | Temp 97.6°F | Resp 20

## 2018-11-06 DIAGNOSIS — Z7189 Other specified counseling: Secondary | ICD-10-CM

## 2018-11-06 DIAGNOSIS — N184 Chronic kidney disease, stage 4 (severe): Secondary | ICD-10-CM | POA: Diagnosis not present

## 2018-11-06 DIAGNOSIS — E1122 Type 2 diabetes mellitus with diabetic chronic kidney disease: Secondary | ICD-10-CM | POA: Diagnosis not present

## 2018-11-06 DIAGNOSIS — S199XXA Unspecified injury of neck, initial encounter: Secondary | ICD-10-CM | POA: Diagnosis not present

## 2018-11-06 DIAGNOSIS — C162 Malignant neoplasm of body of stomach: Secondary | ICD-10-CM | POA: Diagnosis not present

## 2018-11-06 DIAGNOSIS — S299XXA Unspecified injury of thorax, initial encounter: Secondary | ICD-10-CM | POA: Diagnosis not present

## 2018-11-06 DIAGNOSIS — I129 Hypertensive chronic kidney disease with stage 1 through stage 4 chronic kidney disease, or unspecified chronic kidney disease: Secondary | ICD-10-CM | POA: Diagnosis not present

## 2018-11-06 DIAGNOSIS — J449 Chronic obstructive pulmonary disease, unspecified: Secondary | ICD-10-CM | POA: Diagnosis not present

## 2018-11-06 DIAGNOSIS — Z9981 Dependence on supplemental oxygen: Secondary | ICD-10-CM | POA: Diagnosis not present

## 2018-11-06 DIAGNOSIS — S0181XA Laceration without foreign body of other part of head, initial encounter: Secondary | ICD-10-CM | POA: Diagnosis not present

## 2018-11-06 DIAGNOSIS — Z85528 Personal history of other malignant neoplasm of kidney: Secondary | ICD-10-CM | POA: Diagnosis not present

## 2018-11-06 DIAGNOSIS — S0990XA Unspecified injury of head, initial encounter: Secondary | ICD-10-CM | POA: Diagnosis not present

## 2018-11-06 DIAGNOSIS — E538 Deficiency of other specified B group vitamins: Secondary | ICD-10-CM | POA: Diagnosis not present

## 2018-11-06 DIAGNOSIS — N183 Chronic kidney disease, stage 3 (moderate): Secondary | ICD-10-CM | POA: Diagnosis not present

## 2018-11-06 DIAGNOSIS — D49519 Neoplasm of unspecified behavior of unspecified kidney: Secondary | ICD-10-CM

## 2018-11-06 DIAGNOSIS — R05 Cough: Secondary | ICD-10-CM | POA: Diagnosis not present

## 2018-11-06 DIAGNOSIS — Z79899 Other long term (current) drug therapy: Secondary | ICD-10-CM | POA: Diagnosis not present

## 2018-11-06 DIAGNOSIS — R0781 Pleurodynia: Secondary | ICD-10-CM | POA: Diagnosis not present

## 2018-11-06 DIAGNOSIS — S069X9A Unspecified intracranial injury with loss of consciousness of unspecified duration, initial encounter: Secondary | ICD-10-CM | POA: Diagnosis not present

## 2018-11-06 DIAGNOSIS — D509 Iron deficiency anemia, unspecified: Secondary | ICD-10-CM

## 2018-11-06 DIAGNOSIS — G309 Alzheimer's disease, unspecified: Secondary | ICD-10-CM | POA: Diagnosis not present

## 2018-11-06 NOTE — Progress Notes (Signed)
Pt here for follow up. Denies any concerns.  

## 2018-11-07 ENCOUNTER — Other Ambulatory Visit: Payer: Self-pay | Admitting: Family Medicine

## 2018-11-07 ENCOUNTER — Telehealth: Payer: Self-pay

## 2018-11-07 DIAGNOSIS — K219 Gastro-esophageal reflux disease without esophagitis: Secondary | ICD-10-CM

## 2018-11-07 NOTE — Telephone Encounter (Signed)
Orders faxed to Pathology department for Her2, PDL1, & Mismatch Repair. Confirmed fax received.

## 2018-11-08 ENCOUNTER — Other Ambulatory Visit: Payer: Self-pay

## 2018-11-09 ENCOUNTER — Telehealth: Payer: Self-pay

## 2018-11-09 ENCOUNTER — Ambulatory Visit
Admission: RE | Admit: 2018-11-09 | Discharge: 2018-11-09 | Disposition: A | Discharge status: Home / Self Care | Payer: Medicare HMO | Source: Ambulatory Visit | Attending: Radiation Oncology | Admitting: Radiation Oncology

## 2018-11-09 ENCOUNTER — Encounter: Payer: Self-pay | Admitting: Radiation Oncology

## 2018-11-09 ENCOUNTER — Other Ambulatory Visit: Payer: Medicare HMO

## 2018-11-09 ENCOUNTER — Other Ambulatory Visit: Payer: Self-pay

## 2018-11-09 ENCOUNTER — Telehealth: Payer: Self-pay | Admitting: Family Medicine

## 2018-11-09 ENCOUNTER — Encounter: Payer: Self-pay | Admitting: Hematology and Oncology

## 2018-11-09 VITALS — BP 147/76 | Temp 96.5°F

## 2018-11-09 DIAGNOSIS — C162 Malignant neoplasm of body of stomach: Secondary | ICD-10-CM | POA: Diagnosis not present

## 2018-11-09 DIAGNOSIS — E785 Hyperlipidemia, unspecified: Secondary | ICD-10-CM | POA: Insufficient documentation

## 2018-11-09 DIAGNOSIS — Z85528 Personal history of other malignant neoplasm of kidney: Secondary | ICD-10-CM | POA: Diagnosis not present

## 2018-11-09 DIAGNOSIS — N189 Chronic kidney disease, unspecified: Secondary | ICD-10-CM | POA: Insufficient documentation

## 2018-11-09 DIAGNOSIS — D509 Iron deficiency anemia, unspecified: Secondary | ICD-10-CM | POA: Insufficient documentation

## 2018-11-09 DIAGNOSIS — E119 Type 2 diabetes mellitus without complications: Secondary | ICD-10-CM | POA: Insufficient documentation

## 2018-11-09 DIAGNOSIS — Z79899 Other long term (current) drug therapy: Secondary | ICD-10-CM | POA: Insufficient documentation

## 2018-11-09 DIAGNOSIS — Z809 Family history of malignant neoplasm, unspecified: Secondary | ICD-10-CM | POA: Insufficient documentation

## 2018-11-09 DIAGNOSIS — Z89619 Acquired absence of unspecified leg above knee: Secondary | ICD-10-CM | POA: Insufficient documentation

## 2018-11-09 DIAGNOSIS — Z87440 Personal history of urinary (tract) infections: Secondary | ICD-10-CM | POA: Diagnosis not present

## 2018-11-09 DIAGNOSIS — Z86718 Personal history of other venous thrombosis and embolism: Secondary | ICD-10-CM | POA: Diagnosis not present

## 2018-11-09 DIAGNOSIS — F329 Major depressive disorder, single episode, unspecified: Secondary | ICD-10-CM | POA: Insufficient documentation

## 2018-11-09 DIAGNOSIS — Z87891 Personal history of nicotine dependence: Secondary | ICD-10-CM | POA: Diagnosis not present

## 2018-11-09 DIAGNOSIS — M8580 Other specified disorders of bone density and structure, unspecified site: Secondary | ICD-10-CM | POA: Diagnosis not present

## 2018-11-09 DIAGNOSIS — K219 Gastro-esophageal reflux disease without esophagitis: Secondary | ICD-10-CM | POA: Diagnosis not present

## 2018-11-09 DIAGNOSIS — E669 Obesity, unspecified: Secondary | ICD-10-CM | POA: Insufficient documentation

## 2018-11-09 NOTE — Telephone Encounter (Signed)
Referral sent to [facility] surgical oncology

## 2018-11-09 NOTE — Consult Note (Signed)
NEW PATIENT EVALUATION  Name: Amy [name]  MRN: [id number]  Date:   11/09/2018     DOB: 02/25/1936   This 83 y.o. female patient presents to the clinic for initial evaluation of gastric adenocarcinoma.  REFERRING PHYSICIAN: [name], MD  CHIEF COMPLAINT:  Chief Complaint  Patient presents with  . Cancer    Initial consultation    DIAGNOSIS: The encounter diagnosis was Malignant neoplasm of body of stomach ([place]).   PREVIOUS INVESTIGATIONS:  PET CT scan reviewed Clinical notes reviewed Pathology report reviewed Case presented at weekly tumor conference  HPI: Patient is an 83 year old female with extensive comorbidities.  She was diagnosed in 2016 with stage III renal cell carcinoma with positive disease to regional lymph nodes.  She had a PET CT scan which demonstrated a 2.5 cm hypermetabolic lesion just below the GE junction.  She was seen at [facility] underwent EGD which showed a mass in the gastric cardia biopsy positive for adenocarcinoma.  She then underwent on June 5 and EUS at [facility] showing a mass along the greater curvature the proximal gastric body staging T2N0.  Mass was not amendable to endoscopic removal.  Again repeat biopsy showed at least intramucosal adenocarcinoma and round seeing in the background of interstitial metaplasia and low and high-grade dysplasia.  She has been discussed for surgical resection although the patient and husband and son are adamant about refusing surgical intervention.  She is now referred for consideration of palliative radiation therapy.  She is otherwise doing well is asymptomatic at this time specifically denies abdominal pain nausea.  She is extensive comorbidities including iron deficiency anemia chronic renal disease adult onset diabetes deep venous thrombosis with a history of above-knee amputation.  PLANNED TREATMENT REGIMEN: Palliative radiation therapy  PAST MEDICAL HISTORY:  has a past medical history of Anemia, Anxiety,  Arthritis, Bilateral cataracts, Bronchitis, Cataract, Chronic kidney disease, Depression, Diabetes mellitus without complication ([place]), DVT (deep venous thrombosis) ([place]), GERD (gastroesophageal reflux disease), Headache, History of frequent urinary tract infections, renal cell cancer, Hyperlipidemia, Hypertension, Numbness and tingling, Obesity, Osteopenia (10/05/2017), Peripheral artery disease ([place]), Urinary frequency, and Urinary incontinence.    PAST SURGICAL HISTORY:  Past Surgical History:  Procedure Laterality Date  . ABDOMINAL HYSTERECTOMY  1978  . CARPAL TUNNEL RELEASE Bilateral   . CHOLECYSTECTOMY    . COLONOSCOPY    . COLONOSCOPY WITH ESOPHAGOGASTRODUODENOSCOPY (EGD)    . ERCP N/A 08/19/2017   Procedure: ENDOSCOPIC RETROGRADE CHOLANGIOPANCREATOGRAPHY (ERCP);  Surgeon: Arta Silence, MD;  Location: [facility] ENDOSCOPY;  Service: Endoscopy;  Laterality: N/A;  . ESOPHAGOGASTRODUODENOSCOPY (EGD) WITH PROPOFOL N/A 10/12/2018   Procedure: ESOPHAGOGASTRODUODENOSCOPY (EGD) WITH PROPOFOL;  Surgeon: Virgel Manifold, MD;  Location: [facility] ENDOSCOPY;  Service: Endoscopy;  Laterality: N/A;  . LEG AMPUTATION     above the knee / left   . ROBOT ASSISTED LAPAROSCOPIC NEPHRECTOMY Left 01/15/2015   Procedure: ROBOTIC ASSISTED LAPAROSCOPIC RADICAL NEPHRECTOMY;  Surgeon: Alexis Frock, MD;  Location: [facility] ORS;  Service: Urology;  Laterality: Left;  . stent placement right leg       FAMILY HISTORY: family history includes Cancer in her brother, father, and mother; Diabetes in her brother; Healthy in her sister and son.  SOCIAL HISTORY:  reports that she quit smoking about 14 years ago. Her smoking use included cigarettes. She has a 76.50 pack-year smoking history. She has never used smokeless tobacco. She reports previous alcohol use. She reports that she does not use drugs.  ALLERGIES: Fentanyl and Penicillins  MEDICATIONS:  Current  Outpatient Medications  Medication Sig Dispense Refill  .  acetaminophen (TYLENOL) 325 MG tablet Take 325 mg by mouth every 6 (six) hours as needed.    [name] albuterol (VENTOLIN HFA) 108 (90 Base) MCG/ACT inhaler Inhale 1-2 puffs into the lungs every 4 (four) hours as needed.     . Ascorbic Acid (VITAMIN C) 1000 MG tablet Take 1,000 mg by mouth daily.    [name] atorvastatin (LIPITOR) 20 MG tablet TAKE 1 TABLET EVERY NIGHT AT BEDTIME 90 tablet 0  . ciprofloxacin (CIPRO) 250 MG tablet Take 250 mg by mouth 2 (two) times daily.     . clopidogrel (PLAVIX) 75 MG tablet TAKE 1 TABLET EVERY DAY 90 tablet 3  . donepezil (ARICEPT) 10 MG tablet Take 1 tablet (10 mg total) by mouth at bedtime. 90 tablet 3  . famotidine (PEPCID) 20 MG tablet Take 20 mg by mouth daily.     [name] FLUoxetine (PROZAC) 10 MG capsule Take 1 capsule (10 mg total) by mouth daily. 30 capsule 0  . gabapentin (NEURONTIN) 300 MG capsule Take 1 capsule (300 mg total) by mouth 3 (three) times daily. 270 capsule 3  . [START ON 11/22/2018] HYDROcodone-acetaminophen (NORCO) 7.5-325 MG tablet Take 1 tablet by mouth every 8 (eight) hours as needed for up to 30 days for severe pain. Must last 30 days. 90 tablet 0  . ipratropium-albuterol (DUONEB) 0.5-2.5 (3) MG/3ML SOLN INHALE THE CONTENTS OF 1 VIAL VIA NEBULIZER THREE TIMES DAILY AS DIRECTED (Patient taking differently: Inhale 3 mLs into the lungs 3 (three) times daily. ) 810 mL 2  . pantoprazole (PROTONIX) 40 MG tablet Take 1 tablet (40 mg total) by mouth daily. 90 tablet 0  . QUEtiapine (SEROQUEL) 25 MG tablet Take 1 tablet (25 mg total) by mouth See admin instructions. Take 1 tablet in a.m. and 1 tablet in the afternoon and 2 tablets(50 mg total) before bedtime around 8 to 9 PM 150 tablet 0   No current facility-administered medications for this encounter.     ECOG PERFORMANCE STATUS:  0 - Asymptomatic  REVIEW OF SYSTEMS: Patient has above-knee amputation multiple comorbidities listed above Patient denies any weight loss, fatigue, weakness, fever, chills or night  sweats. Patient denies any loss of vision, blurred vision. Patient denies any ringing  of the ears or hearing loss. No irregular heartbeat. Patient denies heart murmur or history of fainting. Patient denies any chest pain or pain radiating to her upper extremities. Patient denies any shortness of breath, difficulty breathing at night, cough or hemoptysis. Patient denies any swelling in the lower legs. Patient denies any nausea vomiting, vomiting of blood, or coffee ground material in the vomitus. Patient denies any stomach pain. Patient states has had normal bowel movements no significant constipation or diarrhea. Patient denies any dysuria, hematuria or significant nocturia. Patient denies any problems walking, swelling in the joints or loss of balance. Patient denies any skin changes, loss of hair or loss of weight. Patient denies any excessive worrying or anxiety or significant depression. Patient denies any problems with insomnia. Patient denies excessive thirst, polyuria, polydipsia. Patient denies any swollen glands, patient denies easy bruising or easy bleeding. Patient denies any recent infections, allergies or URI. Patient "s visual fields have not changed significantly in recent time.   PHYSICAL EXAM: BP (!) 147/76 (BP Location: Right Arm, Patient Position: Sitting)   Temp (!) 96.5 F (35.8 C) (Tympanic)  Well-developed obese female wheelchair-bound in NAD she has a left above-knee amputation.  Well-developed well-nourished  patient in NAD. HEENT reveals PERLA, EOMI, discs not visualized.  Oral cavity is clear. No oral mucosal lesions are identified. Neck is clear without evidence of cervical or supraclavicular adenopathy. Lungs are clear to A&P. Cardiac examination is essentially unremarkable with regular rate and rhythm without murmur rub or thrill. Abdomen is benign with no organomegaly or masses noted. Motor sensory and DTR levels are equal and symmetric in the upper and lower extremities. Cranial  nerves II through XII are grossly intact. Proprioception is intact. No peripheral adenopathy or edema is identified. No motor or sensory levels are noted. Crude visual fields are within normal range.  LABORATORY DATA: Pathology reports reviewed    RADIOLOGY RESULTS: PET CT scan reviewed at least intramucosal adenocarcinoma of the stomach an 83 year old female with multiple medical comorbidities.   IMPRESSION: Stage I  PLAN: At this time I have again discussed the option of going back to [facility] for surgical opinion although both husband and patient are adamant about refusing surgical resection.  Based on her multiple comorbidities advanced age renal cell carcinoma I believe a palliative course of radiation therapy to her gastric lesion would be proper course of action.  I would plan on delivering 4500 cGy in 25 fractions using 3-dimensional treatment planning as well as PET fusion study.  Risks and benefits of treatment including possible to nausea fatigue alteration of blood count skin reaction all were discussed in detail with both the patient and her family.  They all seem to comprehend my treatment plan well.  Will refer back to Dr. Mike Gip for possible concurrent 5-FU chemotherapy.  I have personally set up and ordered CT simulation for next week.  Family and patient will make final determination prior to that time about going ahead with treatment.  I would like to take this opportunity to thank you for allowing me to participate in the care of your patient.[name], MD

## 2018-11-09 NOTE — Telephone Encounter (Signed)
This encounter was created in error - please disregard.

## 2018-11-09 NOTE — Progress Notes (Signed)
Tumor Board Documentation  Amy [name] was presented by Dr Mike Gip at our Tumor Board on 11/09/2018, which included representatives from medical oncology, radiation oncology, surgical oncology, surgical, radiology, pathology, internal medicine, research, navigation.  Amy [name] currently presents as a current patient, for Amy [name], for new positive pathology with history of the following treatments: active survellience.  Additionally, we reviewed previous medical and familial history, history of present illness, and recent lab results along with all available histopathologic and imaging studies. The tumor board considered available treatment options and made the following recommendations: Surgery, Neoadjuvant chemotherapy Refer for possible Surgical resection, Radiation Theraoy and possble 5 FU infuion  The following procedures/referrals were also placed: No orders of the defined types were placed in this encounter.   Clinical Trial Status: not discussed   Staging used: Clinical Stage  AJCC Staging: T: 2 N: 0   Group: Questionable Stage 1 Stomach Adenocarcinoma   National site-specific guidelines NCCN were discussed with respect to the case.  Tumor board is a meeting of clinicians from various specialty areas who evaluate and discuss patients for whom a multidisciplinary approach is being considered. Final determinations in the plan of care are those of the provider(s). The responsibility for follow up of recommendations given during tumor board is that of the provider.   Today's extended care, comprehensive team conference, Amy [name] was not present for the discussion and was not examined.   Multidisciplinary Tumor Board is a multidisciplinary case peer review process.  Decisions discussed in the Multidisciplinary Tumor Board reflect the opinions of the specialists present at the conference without having examined the patient.  Ultimately, treatment and diagnostic decisions rest with  the primary provider(s) and the patient.

## 2018-11-09 NOTE — Chronic Care Management (AMB) (Signed)
°  Chronic Care Management   Outreach Note  11/02/2018 Name: SHADAE REINO MRN: [id number] DOB: 02/13/1936  Referred by: Arnetha Courser, MD Reason for referral : No chief complaint on file.   An unsuccessful telephone outreach was attempted today. The patient was referred to the case management team by for assistance with chronic care management and care coordination.   Follow Up Plan: The care management team will reach out to the patient again over the next 7 days.   [place]  [email or web address]   ??[phone]

## 2018-11-09 NOTE — Chronic Care Management (AMB) (Signed)
°  Chronic Care Management   Outreach Note  11/02/2018 Name: Amy [name] MRN: [id number] DOB: Jan 09, 1936  Referred by: Arnetha Courser, MD Reason for referral : Chronic Care Management (Initial CCM outreach was unsuccessful )   An unsuccessful telephone outreach was attempted today. The patient was referred to the case management team by for assistance with chronic care management and care coordination.   Follow Up Plan: The care management team will reach out to the patient again over the next 7 days.   [place]  [email or web address]   ??[phone]

## 2018-11-09 NOTE — Chronic Care Management (AMB) (Signed)
Chronic Care Management   Note  11/09/2018 Name: [name] MRN: [id number] DOB: 1935-06-22  Amy [name] is a 83 y.o. year old female who is a primary care patient of Lada, Satira Anis, MD. I reached out to Amy [name] by phone today in response to a referral sent by [name]'s health plan.    [name] was given information about Chronic Care Management services today including:  1. CCM service includes personalized support from designated clinical staff supervised by her physician, including individualized plan of care and coordination with other care providers 2. 24/7 contact phone numbers for assistance for urgent and routine care needs. 3. Service will only be billed when office clinical staff spend 20 minutes or more in a month to coordinate care. 4. Only one practitioner may furnish and bill the service in a calendar month. 5. The patient may stop CCM services at any time (effective at the end of the month) by phone call to the office staff. 6. The patient will be responsible for cost sharing (co-pay) of up to 20% of the service fee (after annual deductible is met).  Patient agreed to services and verbal consent obtained.   Follow up plan: Telephone appointment with CCM team member scheduled for: 12/01/2018  [place]  [email or web address]   ??[phone]

## 2018-11-09 NOTE — Addendum Note (Signed)
Addended by: Clerance Lav on: 11/09/2018 09:26 AM   Modules accepted: Level of Service, SmartSet

## 2018-11-10 ENCOUNTER — Telehealth: Payer: Self-pay

## 2018-11-10 NOTE — Telephone Encounter (Signed)
In another attempt to arrange consult at [facility] to discuss options for surgery, Amy [name] was adamant that she does not want surgery and after talking with Dr. Baruch Gouty they are going to pursue the radiation. He states he hoped it was not to late but they do not want to go for the surgical referral. I let him know that I had not made the appointment and made him aware that I will contact Dr. Mike Gip to let her know they are cancelling referral to [facility].

## 2018-11-13 ENCOUNTER — Encounter: Payer: Self-pay | Admitting: Anatomic Pathology & Clinical Pathology

## 2018-11-13 ENCOUNTER — Encounter: Payer: Self-pay | Admitting: Hematology and Oncology

## 2018-11-14 ENCOUNTER — Other Ambulatory Visit: Payer: Self-pay

## 2018-11-14 ENCOUNTER — Other Ambulatory Visit: Payer: Self-pay | Admitting: Family Medicine

## 2018-11-14 DIAGNOSIS — K219 Gastro-esophageal reflux disease without esophagitis: Secondary | ICD-10-CM

## 2018-11-14 MED ORDER — PANTOPRAZOLE SODIUM 40 MG PO TBEC: 40.0000 mg | DELAYED_RELEASE_TABLET | Freq: Every day | ORAL | 0 refills | Status: DC

## 2018-11-14 NOTE — Telephone Encounter (Signed)
Medication Refill - Medication: pantoprazole (PROTONIX) 40 MG tablet   Has the patient contacted their pharmacy? Yes.   (Agent: If no, request that the patient contact the pharmacy for the refill.) (Agent: If yes, when and what did the pharmacy advise?) request sent to office several times with no response   Preferred Pharmacy (with phone number or street name):  [place], [place] [phone] (Phone) [phone] (Fax)     Agent: Please be advised that RX refills may take up to 3 business days. We ask that you follow-up with your pharmacy.

## 2018-11-15 ENCOUNTER — Ambulatory Visit
Admission: RE | Admit: 2018-11-15 | Discharge: 2018-11-15 | Disposition: A | Discharge status: Home / Self Care | Payer: Medicare HMO | Source: Ambulatory Visit | Attending: Radiation Oncology | Admitting: Radiation Oncology

## 2018-11-15 ENCOUNTER — Other Ambulatory Visit: Payer: Self-pay

## 2018-11-15 DIAGNOSIS — C162 Malignant neoplasm of body of stomach: Secondary | ICD-10-CM | POA: Diagnosis not present

## 2018-11-15 DIAGNOSIS — Z510 Encounter for antineoplastic radiation therapy: Secondary | ICD-10-CM | POA: Diagnosis not present

## 2018-11-16 DIAGNOSIS — Z510 Encounter for antineoplastic radiation therapy: Secondary | ICD-10-CM | POA: Diagnosis not present

## 2018-11-16 DIAGNOSIS — C162 Malignant neoplasm of body of stomach: Secondary | ICD-10-CM | POA: Diagnosis not present

## 2018-11-17 ENCOUNTER — Other Ambulatory Visit: Payer: Self-pay

## 2018-11-17 ENCOUNTER — Emergency Department: Payer: Medicare HMO

## 2018-11-17 ENCOUNTER — Other Ambulatory Visit: Payer: Self-pay | Admitting: *Deleted

## 2018-11-17 ENCOUNTER — Emergency Department
Admission: EM | Admit: 2018-11-17 | Discharge: 2018-11-17 | Disposition: A | Discharge status: Home / Self Care | Payer: Medicare HMO | Attending: Emergency Medicine | Admitting: Emergency Medicine

## 2018-11-17 ENCOUNTER — Telehealth: Payer: Self-pay | Admitting: Family Medicine

## 2018-11-17 ENCOUNTER — Encounter: Payer: Self-pay | Admitting: Emergency Medicine

## 2018-11-17 DIAGNOSIS — N390 Urinary tract infection, site not specified: Secondary | ICD-10-CM | POA: Diagnosis not present

## 2018-11-17 DIAGNOSIS — N183 Chronic kidney disease, stage 3 (moderate): Secondary | ICD-10-CM | POA: Diagnosis not present

## 2018-11-17 DIAGNOSIS — Z79899 Other long term (current) drug therapy: Secondary | ICD-10-CM | POA: Insufficient documentation

## 2018-11-17 DIAGNOSIS — Z7951 Long term (current) use of inhaled steroids: Secondary | ICD-10-CM | POA: Diagnosis not present

## 2018-11-17 DIAGNOSIS — C162 Malignant neoplasm of body of stomach: Secondary | ICD-10-CM

## 2018-11-17 DIAGNOSIS — N184 Chronic kidney disease, stage 4 (severe): Secondary | ICD-10-CM | POA: Diagnosis not present

## 2018-11-17 DIAGNOSIS — Z85528 Personal history of other malignant neoplasm of kidney: Secondary | ICD-10-CM | POA: Diagnosis not present

## 2018-11-17 DIAGNOSIS — G309 Alzheimer's disease, unspecified: Secondary | ICD-10-CM | POA: Diagnosis not present

## 2018-11-17 DIAGNOSIS — Z9981 Dependence on supplemental oxygen: Secondary | ICD-10-CM | POA: Insufficient documentation

## 2018-11-17 DIAGNOSIS — E1122 Type 2 diabetes mellitus with diabetic chronic kidney disease: Secondary | ICD-10-CM | POA: Insufficient documentation

## 2018-11-17 DIAGNOSIS — R05 Cough: Secondary | ICD-10-CM | POA: Diagnosis not present

## 2018-11-17 DIAGNOSIS — Z87891 Personal history of nicotine dependence: Secondary | ICD-10-CM | POA: Diagnosis not present

## 2018-11-17 DIAGNOSIS — Z89612 Acquired absence of left leg above knee: Secondary | ICD-10-CM | POA: Diagnosis not present

## 2018-11-17 DIAGNOSIS — J449 Chronic obstructive pulmonary disease, unspecified: Secondary | ICD-10-CM | POA: Diagnosis not present

## 2018-11-17 DIAGNOSIS — I129 Hypertensive chronic kidney disease with stage 1 through stage 4 chronic kidney disease, or unspecified chronic kidney disease: Secondary | ICD-10-CM | POA: Insufficient documentation

## 2018-11-17 DIAGNOSIS — R1010 Upper abdominal pain, unspecified: Secondary | ICD-10-CM | POA: Diagnosis present

## 2018-11-17 DIAGNOSIS — R0781 Pleurodynia: Secondary | ICD-10-CM | POA: Insufficient documentation

## 2018-11-17 DIAGNOSIS — J441 Chronic obstructive pulmonary disease with (acute) exacerbation: Secondary | ICD-10-CM | POA: Diagnosis not present

## 2018-11-17 DIAGNOSIS — F0390 Unspecified dementia without behavioral disturbance: Secondary | ICD-10-CM | POA: Diagnosis not present

## 2018-11-17 LAB — CBC
HCT: 34.2 % — ABNORMAL LOW (ref 36.0–46.0)
Hemoglobin: 10.7 g/dL — ABNORMAL LOW (ref 12.0–15.0)
MCH: 28.1 pg (ref 26.0–34.0)
MCHC: 31.3 g/dL (ref 30.0–36.0)
MCV: 89.8 fL (ref 80.0–100.0)
Platelets: 262 10*3/uL (ref 150–400)
RBC: 3.81 MIL/uL — ABNORMAL LOW (ref 3.87–5.11)
RDW: 15.8 % — ABNORMAL HIGH (ref 11.5–15.5)
WBC: 10.9 10*3/uL — ABNORMAL HIGH (ref 4.0–10.5)
nRBC: 0 % (ref 0.0–0.2)

## 2018-11-17 LAB — COMPREHENSIVE METABOLIC PANEL
ALT: 12 U/L (ref 0–44)
AST: 17 U/L (ref 15–41)
Albumin: 3.1 g/dL — ABNORMAL LOW (ref 3.5–5.0)
Alkaline Phosphatase: 84 U/L (ref 38–126)
Anion gap: 10 (ref 5–15)
BUN: 18 mg/dL (ref 8–23)
CO2: 21 mmol/L — ABNORMAL LOW (ref 22–32)
Calcium: 8.5 mg/dL — ABNORMAL LOW (ref 8.9–10.3)
Chloride: 109 mmol/L (ref 98–111)
Creatinine, Ser: 1.75 mg/dL — ABNORMAL HIGH (ref 0.44–1.00)
GFR calc Af Amer: 31 mL/min — ABNORMAL LOW (ref >60)
GFR calc non Af Amer: 27 mL/min — ABNORMAL LOW (ref >60)
Glucose, Bld: 107 mg/dL — ABNORMAL HIGH (ref 70–99)
Potassium: 5 mmol/L (ref 3.5–5.1)
Sodium: 140 mmol/L (ref 135–145)
Total Bilirubin: 0.6 mg/dL (ref 0.3–1.2)
Total Protein: 6.6 g/dL (ref 6.5–8.1)

## 2018-11-17 LAB — LIPASE, BLOOD: Lipase: 34 U/L (ref 11–51)

## 2018-11-17 MED ORDER — SODIUM CHLORIDE 0.9% FLUSH: 3.0000 mL | Freq: Once | INTRAVENOUS | Status: DC

## 2018-11-17 MED ORDER — OXYCODONE HCL 5 MG PO TABS
5.0000 mg | ORAL_TABLET | Freq: Once | ORAL | Status: AC
  Administered 2018-11-17: 5 mg via ORAL
  Filled 2018-11-17: qty 1

## 2018-11-17 NOTE — Telephone Encounter (Signed)
This is acceptable to me- recommend discussing with prescribing provider.

## 2018-11-17 NOTE — Discharge Instructions (Signed)
Please continue taking your pain medication as well as her gabapentin in addition to the other medications prescribed by your primary care provider.  If your pain changes or gets worse and you are unable to see primary care, please return to the emergency department.

## 2018-11-17 NOTE — TOC Initial Note (Signed)
Transition of Care [facility]) - Initial/Assessment Note    Patient Details  Name: Amy [name] MRN: [id number] Date of Birth: 09/01/1935  Transition of Care [facility]) CM/SW Contact:    Marshell Garfinkel, RN Phone Number: 11/17/2018, 4:15 PM  Clinical Narrative:                  [facility].        Patient Goals and CMS Choice        Expected Discharge Plan and Services                                                Prior Living Arrangements/Services                       Activities of Daily Living      Permission Sought/Granted                  Emotional Assessment              Admission diagnosis:  abd and rib pain Patient Active Problem List   Diagnosis Date Noted  . B12 deficiency 11/05/2018  . Folate deficiency 11/05/2018  . Gastric cancer ([place])   . Stomach irritation   . Abnormal finding on GI tract imaging   . Obesity (BMI 30.0-34.9) 09/11/2018  . Abnormal gastrointestinal PET scan 08/27/2018  . Pulmonary nodules 08/27/2018  . Goals of care, counseling/discussion   . Palliative care by specialist   . Hypoxia 07/26/2018  . HCAP (healthcare-associated pneumonia) 07/02/2018  . Hypoxemia   . COPD exacerbation ([place])   . Renal cell carcinoma ([place])   . Lung nodule 04/23/2018  . Hypertensive renal disease 11/08/2017  . Secondary hyperparathyroidism of renal origin ([place]) 11/08/2017  . QT prolongation 10/27/2017  . Osteopenia 10/05/2017  . Choledocholithiasis 08/13/2017  . Chronic pain syndrome 04/05/2017  . Depression 11/15/2016  . Iron deficiency anemia 09/08/2016  . Anemia 06/29/2016  . Late onset Alzheimer's disease with behavioral disturbance ([place]) 06/14/2016  . S/P AKA (above knee amputation) unilateral, left ([place]) 06/08/2016  . Wheelchair dependent 03/11/2016  . 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 ([place]) 03/13/2015  . S/p nephrectomy 01/29/2015  . Renal neoplasm  01/15/2015  . Abnormal EKG 01/02/2015  . Renal mass 12/05/2014  . GERD (gastroesophageal reflux disease) 11/20/2014  . GAD (generalized anxiety disorder) 11/20/2014  . AB (asthmatic bronchitis) 11/20/2014  . Aortic atherosclerosis ([place]) 11/20/2014  . Cataract 11/20/2014  . Leg pain 11/20/2014  . Continuous opioid dependence ([place]) 11/20/2014  . CKD (chronic kidney disease) stage 4, GFR 15-29 ml/min (HCC) 11/20/2014  . COPD (chronic obstructive pulmonary disease) ([place]) 11/20/2014  . Excessive falling 11/20/2014  . Fatty infiltration of liver 11/20/2014  . Hyperlipidemia 11/20/2014  . Anterior knee pain 11/20/2014  . LBP (low back pain) 11/20/2014  . Malaise and fatigue 11/20/2014  . Abnormal presence of protein in urine 11/20/2014  . PVD (peripheral vascular disease) ([place]) 11/20/2014  . Allergic rhinitis 11/20/2014  . At risk for falling 11/20/2014  . Phantom limb syndrome with pain ([place]) 11/05/2014  . Anxiety 11/05/2014  . Elevated serum creatinine 11/05/2014  . Diabetes mellitus type 2, controlled ([place]) 11/05/2014  . H/O adenomatous polyp of colon 07/27/2010   PCP:  Arnetha Courser, MD Pharmacy:  [facility] - [place], [place] - [address] [place] [address] Phone: [phone] Fax: [phone]  [facility] - [place], [place] [address] Phone: [phone] Fax: [phone]     Social Determinants of Health (SDOH) Interventions    Readmission Risk Interventions No flowsheet data found.

## 2018-11-17 NOTE — ED Notes (Signed)
Pts husband to home to get O2 for ride home. Pt discharged and in lobby with our O2 until husband can arrive.

## 2018-11-17 NOTE — Telephone Encounter (Signed)
Left detailed VM with Trish

## 2018-11-17 NOTE — ED Triage Notes (Signed)
Pt to ED via POV c/o LUQ abd pain that started today. Pt denies N/V/D. Pt husband with her helping to provide medical history. Per Husband pt just started c/o of pain today.

## 2018-11-17 NOTE — ED Notes (Signed)
Patient discharged to home

## 2018-11-17 NOTE — Telephone Encounter (Signed)
Larena Glassman, RN with [facility], calling to inquire if patient is able to change the times that she takes QUEtiapine (SEROQUEL) 25 MG tablet, as patients husband reports to RN that patient seems to want to sleep all day. Larena Glassman inquired if it would be okay to take only at night. Please advise.

## 2018-11-17 NOTE — ED Provider Notes (Signed)
[facility] Emergency Department Provider Note  ___________________________________________   None    (approximate)  I have reviewed the triage vital signs and the nursing notes.   HISTORY  Chief Complaint Abdominal Pain  HPI Amy [name] is a 83 y.o. female who presents to the emergency department for treatment and evaluation of left upper abdominal/left side pain.  Pain started this afternoon.  She was evaluated by her home health aide and at that time began to experience the pain.  No alleviating measures attempted prior to arrival.  She has no other symptoms of concern.  She is no more short of breath than usual.  She is using her home oxygen without feeling the need to increase the liters per minute.     Past Medical History:  Diagnosis Date  . Anemia    low iron  . Anxiety   . Arthritis   . Bilateral cataracts   . Bronchitis    hx of   . Cataract   . Chronic kidney disease   . Depression   . Diabetes mellitus without complication ([place])   . 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 GERD (gastroesophageal reflux disease)   . Headache   . History of frequent urinary tract infections   . Hx of renal cell cancer    LEFT  . Hyperlipidemia   . Hypertension   . Numbness and tingling    right hand   . Obesity   . Osteopenia 10/05/2017  . Peripheral artery disease ([place])   . Urinary frequency   . Urinary incontinence     Patient Active Problem List   Diagnosis Date Noted  . B12 deficiency 11/05/2018  . Folate deficiency 11/05/2018  . 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 Stomach irritation   . Abnormal finding on GI tract imaging   . Obesity (BMI 30.0-34.9) 09/11/2018  . Abnormal gastrointestinal PET scan 08/27/2018  . Pulmonary nodules 08/27/2018  . Goals of care, counseling/discussion   . Palliative care by specialist   . Hypoxia 07/26/2018  . HCAP (healthcare-associated pneumonia) 07/02/2018  .  Hypoxemia   . COPD exacerbation ([place])   . Renal cell carcinoma ([place])   . Lung nodule 04/23/2018  . Hypertensive renal disease 11/08/2017  . Secondary hyperparathyroidism of renal origin ([place]) 11/08/2017  . QT prolongation 10/27/2017  . Osteopenia 10/05/2017  . Choledocholithiasis 08/13/2017  . Chronic pain syndrome 04/05/2017  . Depression 11/15/2016  . Iron deficiency anemia 09/08/2016  . Anemia 06/29/2016  . Late onset Alzheimer's disease with behavioral disturbance ([place]) 06/14/2016  . S/P AKA (above knee amputation) unilateral, left ([place]) 06/08/2016  . Wheelchair dependent 03/11/2016  . 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 ([place]) 03/13/2015  . S/p nephrectomy 01/29/2015  . Renal neoplasm 01/15/2015  . Abnormal EKG 01/02/2015  . Renal mass 12/05/2014  . GERD (gastroesophageal reflux disease) 11/20/2014  . GAD (generalized anxiety disorder) 11/20/2014  . AB (asthmatic bronchitis) 11/20/2014  . Aortic atherosclerosis ([place]) 11/20/2014  . Cataract 11/20/2014  . Leg pain 11/20/2014  . Continuous opioid dependence ([place]) 11/20/2014  . CKD (chronic kidney disease) stage 4, GFR 15-29 ml/min (HCC) 11/20/2014  . COPD (chronic obstructive pulmonary disease) ([place]) 11/20/2014  . Excessive falling 11/20/2014  . Fatty infiltration of liver 11/20/2014  . Hyperlipidemia 11/20/2014  . Anterior knee pain 11/20/2014  . LBP (low back pain) 11/20/2014  . Malaise and fatigue 11/20/2014  .  Abnormal presence of protein in urine 11/20/2014  . PVD (peripheral vascular disease) ([place]) 11/20/2014  . Allergic rhinitis 11/20/2014  . At risk for falling 11/20/2014  . Phantom limb syndrome with pain ([place]) 11/05/2014  . Anxiety 11/05/2014  . Elevated serum creatinine 11/05/2014  . Diabetes mellitus type 2, controlled ([place]) 11/05/2014  . H/O adenomatous polyp of colon 07/27/2010    Past Surgical History:  Procedure Laterality Date  . ABDOMINAL HYSTERECTOMY  1978  . CARPAL TUNNEL RELEASE  Bilateral   . CHOLECYSTECTOMY    . COLONOSCOPY    . COLONOSCOPY WITH ESOPHAGOGASTRODUODENOSCOPY (EGD)    . ERCP N/A 08/19/2017   Procedure: ENDOSCOPIC RETROGRADE CHOLANGIOPANCREATOGRAPHY (ERCP);  Surgeon: Arta Silence, MD;  Location: [facility] ENDOSCOPY;  Service: Endoscopy;  Laterality: N/A;  . ESOPHAGOGASTRODUODENOSCOPY (EGD) WITH PROPOFOL N/A 10/12/2018   Procedure: ESOPHAGOGASTRODUODENOSCOPY (EGD) WITH PROPOFOL;  Surgeon: Virgel Manifold, MD;  Location: [facility] ENDOSCOPY;  Service: Endoscopy;  Laterality: N/A;  . LEG AMPUTATION     above the knee / left   . ROBOT ASSISTED LAPAROSCOPIC NEPHRECTOMY Left 01/15/2015   Procedure: ROBOTIC ASSISTED LAPAROSCOPIC RADICAL NEPHRECTOMY;  Surgeon: Alexis Frock, MD;  Location: [facility] ORS;  Service: Urology;  Laterality: Left;  . stent placement right leg       Prior to Admission medications   Medication Sig Start Date End Date Taking? Authorizing Provider  acetaminophen (TYLENOL) 325 MG tablet Take 325 mg by mouth every 6 (six) hours as needed.    [provider]  albuterol (VENTOLIN HFA) 108 (90 Base) MCG/ACT inhaler Inhale 1-2 puffs into the lungs every 4 (four) hours as needed.  06/15/17   [provider]  Ascorbic Acid (VITAMIN C) 1000 MG tablet Take 1,000 mg by mouth daily.    [provider]  atorvastatin (LIPITOR) 20 MG tablet TAKE 1 TABLET EVERY NIGHT AT BEDTIME 10/17/18   Poulose, Bethel Born, NP  ciprofloxacin (CIPRO) 250 MG tablet Take 250 mg by mouth 2 (two) times daily.  11/01/18   [provider]  clopidogrel (PLAVIX) 75 MG tablet TAKE 1 TABLET EVERY DAY 09/14/18   Algernon Huxley, MD  donepezil (ARICEPT) 10 MG tablet Take 1 tablet (10 mg total) by mouth at bedtime. 10/06/17   Arnetha Courser, MD  famotidine (PEPCID) 20 MG tablet Take 20 mg by mouth daily.  04/05/18   [provider]  FLUoxetine (PROZAC) 10 MG capsule Take 1 capsule (10 mg total) by mouth daily. 07/22/18   Nicholes Mango, MD  gabapentin  (NEURONTIN) 300 MG capsule Take 1 capsule (300 mg total) by mouth 3 (three) times daily. 06/21/18   Gillis Santa, MD  HYDROcodone-acetaminophen (NORCO) 7.5-325 MG tablet Take 1 tablet by mouth every 8 (eight) hours as needed for up to 30 days for severe pain. Must last 30 days. 11/22/18 12/22/18  Gillis Santa, MD  ipratropium-albuterol (DUONEB) 0.5-2.5 (3) MG/3ML SOLN INHALE THE CONTENTS OF 1 VIAL VIA NEBULIZER THREE TIMES DAILY AS DIRECTED Patient taking differently: Inhale 3 mLs into the lungs 3 (three) times daily.  11/10/17   Laverle Hobby, MD  pantoprazole (PROTONIX) 40 MG tablet Take 1 tablet (40 mg total) by mouth daily. 11/14/18   Poulose, Bethel Born, NP  QUEtiapine (SEROQUEL) 25 MG tablet Take 1 tablet (25 mg total) by mouth See admin instructions. Take 1 tablet in a.m. and 1 tablet in the afternoon and 2 tablets(50 mg total) before bedtime around 8 to 9 PM 07/27/18   Vaughan Basta, MD  Allergies Fentanyl and Penicillins  Family History  Problem Relation Age of Onset  . Cancer Mother        Stomach  . Cancer Father   . Diabetes Brother   . Cancer Brother        oral  . Healthy Son   . Healthy Sister     Social History Social History   Tobacco Use  . Smoking status: Former Smoker    Packs/day: 1.50    Years: 51.00    Pack years: 76.50    Types: Cigarettes    Quit date: 05/24/2004    Years since quitting: 14.4  . Smokeless tobacco: Never Used  . Tobacco comment: smoking cessation materials not required  Substance Use Topics  . Alcohol use: Not Currently  . Drug use: No    Review of Systems  Constitutional: No fever/chills Eyes: No visual changes. ENT: No sore throat. Cardiovascular: Denies chest pain. Respiratory: Denies shortness of breath. Gastrointestinal: Positive for left upper abdominal pain.  No nausea, no vomiting.  No diarrhea.  No constipation. Genitourinary: Negative for dysuria. Musculoskeletal: Negative for back pain. Skin: Negative  for rash. Neurological: Negative for headaches, focal weakness or numbness. ____________________________________________   PHYSICAL EXAM:  VITAL SIGNS: ED Triage Vitals  Enc Vitals Group     BP 11/17/18 1514 (!) 130/53     Pulse Rate 11/17/18 1514 83     Resp 11/17/18 1514 16     Temp 11/17/18 1514 98.4 F (36.9 C)     Temp Source 11/17/18 1514 Oral     SpO2 11/17/18 1514 96 %     Weight --      Height --      Head Circumference --      Peak Flow --      Pain Score 11/17/18 1512 10     Pain Loc --      Pain Edu? --      Excl. in [place]? --     Constitutional: Alert and oriented. Well appearing and in no acute distress. Eyes: Conjunctivae are normal. Head: Atraumatic. Nose: No congestion/rhinnorhea. Mouth/Throat: Mucous membranes are moist.  Oropharynx non-erythematous. Neck: No stridor.   Cardiovascular: Normal rate, regular rhythm. Grossly normal heart sounds.  Good peripheral circulation. Respiratory: Normal respiratory effort.  No retractions. Lungs CTAB. Gastrointestinal: Soft and nontender. No distention. No abdominal bruits. No CVA tenderness. Musculoskeletal: Focal area of pain in the area marked with a radiographic marker for radiation. Neurologic:  Normal speech and language. No gross focal neurologic deficits are appreciated. No gait instability. Skin:  Skin is warm, dry and intact. No rash noted. Psychiatric: Mood and affect are normal. Speech and behavior are normal.  ____________________________________________   LABS (all labs ordered are listed, but only abnormal results are displayed)  Labs Reviewed  COMPREHENSIVE METABOLIC PANEL - Abnormal; Notable for the following components:      Result Value   CO2 21 (*)    Glucose, Bld 107 (*)    Creatinine, Ser 1.75 (*)    Calcium 8.5 (*)    Albumin 3.1 (*)    GFR calc non Af Amer 27 (*)    GFR calc Af Amer 31 (*)    All other components within normal limits  CBC - Abnormal; Notable for the following  components:   WBC 10.9 (*)    RBC 3.81 (*)    Hemoglobin 10.7 (*)    HCT 34.2 (*)    RDW 15.8 (*)  All other components within normal limits  LIPASE, BLOOD  URINALYSIS, COMPLETE (UACMP) WITH MICROSCOPIC   ____________________________________________  EKG  Not indicated. ____________________________________________  RADIOLOGY  ED MD interpretation:  No acute findings per radiology.  Official radiology report(s): Dg Chest 2 View  Result Date: 11/17/2018 CLINICAL DATA:  Left rib and left upper quadrant pain. Cough. Wheezing. EXAM: CHEST - 2 VIEW COMPARISON:  Chest radiograph 07/25/2018 and PET-CT 08/24/2018 FINDINGS: The cardiomediastinal silhouette is unchanged. Aortic atherosclerosis is noted. There may be a trace residual right pleural effusion. Mild left greater than right basilar opacities suggest atelectasis and scarring. A chronic severe T12 compression fracture is noted. IMPRESSION: Bibasilar scarring and atelectasis with possible trace residual right pleural effusion. Electronically Signed   By: Logan Bores M.D.   On: 11/17/2018 20:06    ____________________________________________   PROCEDURES  Procedure(s) performed: None  Procedures  Critical Care performed: No  ____________________________________________   INITIAL IMPRESSION / ASSESSMENT AND PLAN / ED COURSE    83 year old female presenting to the emergency department for treatment and evaluation of focal pain on the left lateral lower rib.  Chest x-ray does not show any focal abnormality and no new cardiopulmonary findings.  CBC and CMP are all baseline.  Lipase is normal as well.   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  The area that she indicates that is painful is directly over the area where the radiographic marker has been placed.  Her abdomen is soft.  She has no focal abdominal tenderness on exam.  The tenderness is only over the lower  lateral left rib.  Patient is requesting to be discharged home.  Chest x-ray is reassuring.  Labs are also reassuring.  She will be given a dose of OxyIR and discharged home.  She is to follow-up with her primary care provider/oncologist if not improving over the next few days.  She was also advised that she may return to the emergency department at any point if she feels that her pain is uncontrollable or if she experiences any new concerns.        ____________________________________________   FINAL CLINICAL IMPRESSION(S) / ED DIAGNOSES  Final diagnoses:  Rib pain on left side     ED Discharge Orders    None       Note:  This document was prepared using Dragon voice recognition software and may include unintentional dictation errors.    Victorino Dike, FNP 11/17/18 2043    Nena Polio, MD 11/17/18 2248

## 2018-11-20 ENCOUNTER — Inpatient Hospital Stay: Payer: Medicare HMO | Admitting: Oncology

## 2018-11-20 ENCOUNTER — Emergency Department
Admission: EM | Admit: 2018-11-20 | Discharge: 2018-11-20 | Disposition: A | Discharge status: Home / Self Care | Payer: Medicare HMO | Source: Home / Self Care | Attending: Emergency Medicine | Admitting: Emergency Medicine

## 2018-11-20 ENCOUNTER — Encounter: Payer: Self-pay | Admitting: Medical Oncology

## 2018-11-20 ENCOUNTER — Emergency Department: Payer: Medicare HMO

## 2018-11-20 ENCOUNTER — Telehealth: Payer: Self-pay | Admitting: *Deleted

## 2018-11-20 DIAGNOSIS — R410 Disorientation, unspecified: Secondary | ICD-10-CM | POA: Diagnosis not present

## 2018-11-20 DIAGNOSIS — S299XXA Unspecified injury of thorax, initial encounter: Secondary | ICD-10-CM | POA: Diagnosis not present

## 2018-11-20 DIAGNOSIS — Z79899 Other long term (current) drug therapy: Secondary | ICD-10-CM | POA: Insufficient documentation

## 2018-11-20 DIAGNOSIS — R0902 Hypoxemia: Secondary | ICD-10-CM | POA: Diagnosis not present

## 2018-11-20 DIAGNOSIS — N189 Chronic kidney disease, unspecified: Secondary | ICD-10-CM | POA: Insufficient documentation

## 2018-11-20 DIAGNOSIS — S199XXA Unspecified injury of neck, initial encounter: Secondary | ICD-10-CM | POA: Diagnosis not present

## 2018-11-20 DIAGNOSIS — R58 Hemorrhage, not elsewhere classified: Secondary | ICD-10-CM | POA: Diagnosis not present

## 2018-11-20 DIAGNOSIS — S0990XA Unspecified injury of head, initial encounter: Secondary | ICD-10-CM

## 2018-11-20 DIAGNOSIS — I129 Hypertensive chronic kidney disease with stage 1 through stage 4 chronic kidney disease, or unspecified chronic kidney disease: Secondary | ICD-10-CM | POA: Insufficient documentation

## 2018-11-20 DIAGNOSIS — Y939 Activity, unspecified: Secondary | ICD-10-CM | POA: Insufficient documentation

## 2018-11-20 DIAGNOSIS — R0689 Other abnormalities of breathing: Secondary | ICD-10-CM | POA: Diagnosis not present

## 2018-11-20 DIAGNOSIS — S0181XA Laceration without foreign body of other part of head, initial encounter: Secondary | ICD-10-CM | POA: Insufficient documentation

## 2018-11-20 DIAGNOSIS — W19XXXA Unspecified fall, initial encounter: Secondary | ICD-10-CM | POA: Diagnosis not present

## 2018-11-20 DIAGNOSIS — Y999 Unspecified external cause status: Secondary | ICD-10-CM | POA: Insufficient documentation

## 2018-11-20 DIAGNOSIS — R0781 Pleurodynia: Secondary | ICD-10-CM | POA: Diagnosis not present

## 2018-11-20 DIAGNOSIS — S069X9A Unspecified intracranial injury with loss of consciousness of unspecified duration, initial encounter: Secondary | ICD-10-CM | POA: Diagnosis not present

## 2018-11-20 DIAGNOSIS — Y929 Unspecified place or not applicable: Secondary | ICD-10-CM | POA: Insufficient documentation

## 2018-11-20 DIAGNOSIS — Z7901 Long term (current) use of anticoagulants: Secondary | ICD-10-CM | POA: Insufficient documentation

## 2018-11-20 DIAGNOSIS — Z87891 Personal history of nicotine dependence: Secondary | ICD-10-CM | POA: Insufficient documentation

## 2018-11-20 MED ORDER — LIDOCAINE HCL (PF) 1 % IJ SOLN
5.0000 mL | Freq: Once | INTRAMUSCULAR | Status: AC
  Administered 2018-11-20: 5 mL via INTRADERMAL
  Filled 2018-11-20: qty 5

## 2018-11-20 MED ORDER — BACITRACIN-NEOMYCIN-POLYMYXIN 400-5-5000 EX OINT
TOPICAL_OINTMENT | Freq: Once | CUTANEOUS | Status: AC
  Administered 2018-11-20: 18:00:00 via TOPICAL
  Filled 2018-11-20: qty 1

## 2018-11-20 NOTE — ED Notes (Signed)
Pt a&o x 3. Pt states it is currently February or march. When corrected pt states " I was way off wasn't I ?" pt states she feels more confused than normal

## 2018-11-20 NOTE — ED Triage Notes (Addendum)
Pt to ED via ems- husband was taking pt in her wheelchair down a ramp and pt placed her foot down causing her to flip forward out of the chair. Pt has laceration to forehead. Pt unsure of LOC. A/O x4. C/o of rib pain. Was going to cancer center apt today. Denies use of blood thinner

## 2018-11-20 NOTE — ED Notes (Signed)
Pt ox decreased to 2L [place] at this time. Pt maintaining oxygen saturation above 92%

## 2018-11-20 NOTE — ED Notes (Addendum)
Pt husband contacted at this time to give update. Husband states he will come to pick up the pt at this time

## 2018-11-20 NOTE — Telephone Encounter (Signed)
Patient accepts appointment for 245 today in [facility]

## 2018-11-20 NOTE — Telephone Encounter (Signed)
  Notes from ER reviewed.  Etiology of pain unclear.  When I recently saw her, she had no pain.    The gastric tumor apparently is small and would not cause pain.  Can she go to the symptom management clinic?  I think she needs to be seen if she is going to be prescribed pain medications.  Nolon Stalls

## 2018-11-20 NOTE — TOC Initial Note (Signed)
Transition of Care [facility]) - Initial/Assessment Note    Patient Details  Name: SERENIDY WALTZ MRN: [id number] Date of Birth: 10/15/1935  [facility]) CM/SW Contact:    Marshell Garfinkel, RN Phone Number: 11/20/2018, 3:58 PM  Clinical Narrative:                  [facility] to [facility].        Patient Goals and CMS Choice        Expected Discharge Plan and Services                                                Prior Living Arrangements/Services                       Activities of Daily Living      Permission Sought/Granted                  Emotional Assessment              Admission diagnosis:  Fall Patient Active Problem List   Diagnosis Date Noted  . B12 deficiency 11/05/2018  . Folate deficiency 11/05/2018  . 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 Stomach irritation   . Abnormal finding on GI tract imaging   . Obesity (BMI 30.0-34.9) 09/11/2018  . Abnormal gastrointestinal PET scan 08/27/2018  . Pulmonary nodules 08/27/2018  . Goals of care, counseling/discussion   . Palliative care by specialist   . Hypoxia 07/26/2018  . HCAP (healthcare-associated pneumonia) 07/02/2018  . Hypoxemia   . COPD exacerbation ([place])   . Renal cell carcinoma ([place])   . Lung nodule 04/23/2018  . Hypertensive renal disease 11/08/2017  . Secondary hyperparathyroidism of renal origin ([employer]) 11/08/2017  . QT prolongation 10/27/2017  . Osteopenia 10/05/2017  . Choledocholithiasis 08/13/2017  . Chronic pain syndrome 04/05/2017  . Depression 11/15/2016  . Iron deficiency anemia 09/08/2016  . Anemia 06/29/2016  . Late onset Alzheimer's disease with behavioral disturbance ([place]) 06/14/2016  . S/P AKA (above knee amputation) unilateral, left ([place]) 06/08/2016  . Wheelchair dependent 03/11/2016  . 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 ([place]) 03/13/2015  . S/p nephrectomy 01/29/2015  . Renal neoplasm 01/15/2015  .  Abnormal EKG 01/02/2015  . Renal mass 12/05/2014  . GERD (gastroesophageal reflux disease) 11/20/2014  . GAD (generalized anxiety disorder) 11/20/2014  . AB (asthmatic bronchitis) 11/20/2014  . Aortic atherosclerosis ([place]) 11/20/2014  . Cataract 11/20/2014  . Leg pain 11/20/2014  . Continuous opioid dependence ([place]) 11/20/2014  . CKD (chronic kidney disease) stage 4, GFR 15-29 ml/min (HCC) 11/20/2014  . COPD (chronic obstructive pulmonary disease) ([place]) 11/20/2014  . Excessive falling 11/20/2014  . Fatty infiltration of liver 11/20/2014  . Hyperlipidemia 11/20/2014  . Anterior knee pain 11/20/2014  . LBP (low back pain) 11/20/2014  . Malaise and fatigue 11/20/2014  . Abnormal presence of protein in urine 11/20/2014  . PVD (peripheral vascular disease) ([place]) 11/20/2014  . Allergic rhinitis 11/20/2014  . At risk for falling 11/20/2014  . Phantom limb syndrome with pain ([place]) 11/05/2014  . Anxiety 11/05/2014  . Elevated serum creatinine 11/05/2014  . Diabetes mellitus type 2, controlled ([place]) 11/05/2014  . H/O adenomatous polyp of colon 07/27/2010   PCP:  Arnetha Courser, MD Pharmacy:   [facility]  [place], [place] [address] Phone: [phone] Fax: [phone]  [facility] - [place], [address] [place] [address] Phone: [phone] Fax: [phone]     Social Determinants of Health (SDOH) Interventions    Readmission Risk Interventions No flowsheet data found.

## 2018-11-20 NOTE — ED Notes (Signed)
Pt unable to sign d/c due to dementia hx. Instructions discussed via telephone and in person. Pt husband verbalized understanding of d/c and follow up instructions.

## 2018-11-20 NOTE — ED Notes (Signed)
Patient transported to CT 

## 2018-11-20 NOTE — Telephone Encounter (Signed)
Patient husband called reporting that she went to ER Friday and that she is in pain and needs something done for her. Please advise

## 2018-11-21 ENCOUNTER — Other Ambulatory Visit: Payer: Self-pay

## 2018-11-21 ENCOUNTER — Ambulatory Visit
Admission: RE | Admit: 2018-11-21 | Discharge: 2018-11-21 | Disposition: A | Discharge status: Home / Self Care | Payer: Medicare HMO | Source: Ambulatory Visit | Attending: Radiation Oncology | Admitting: Radiation Oncology

## 2018-11-21 DIAGNOSIS — C162 Malignant neoplasm of body of stomach: Secondary | ICD-10-CM | POA: Diagnosis not present

## 2018-11-21 DIAGNOSIS — Z510 Encounter for antineoplastic radiation therapy: Secondary | ICD-10-CM | POA: Diagnosis not present

## 2018-11-21 NOTE — ED Provider Notes (Signed)
[facility] Emergency Department Provider Note   First MD Initiated Contact with Patient 11/20/18 1513     (approximate)  I have reviewed the triage vital signs and the nursing notes.   HISTORY  Chief Complaint Fall and Laceration   HPI Amy [name] is a 83 y.o. female with below list of previous medical conditions presents to the emergency department via EMS with history of accidentally falling out of her wheelchair down a ramp when she placed her foot on the ground while her husband was pushing the chair.  Patient denies any complaints at present.        Past Medical History:  Diagnosis Date  . Anemia    low iron  . Anxiety   . Arthritis   . Bilateral cataracts   . Bronchitis    hx of   . Cataract   . Chronic kidney disease   . Depression   . Diabetes mellitus without complication ([place])   . 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 GERD (gastroesophageal reflux disease)   . Headache   . History of frequent urinary tract infections   . Hx of renal cell cancer    LEFT  . Hyperlipidemia   . Hypertension   . Numbness and tingling    right hand   . Obesity   . Osteopenia 10/05/2017  . Peripheral artery disease ([place])   . Urinary frequency   . Urinary incontinence     Patient Active Problem List   Diagnosis Date Noted  . B12 deficiency 11/05/2018  . Folate deficiency 11/05/2018  . 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 Stomach irritation   . Abnormal finding on GI tract imaging   . Obesity (BMI 30.0-34.9) 09/11/2018  . Abnormal gastrointestinal PET scan 08/27/2018  . Pulmonary nodules 08/27/2018  . Goals of care, counseling/discussion   . Palliative care by specialist   . Hypoxia 07/26/2018  . HCAP (healthcare-associated pneumonia) 07/02/2018  . Hypoxemia   . COPD exacerbation ([place])   . Renal cell carcinoma ([place])   . Lung nodule 04/23/2018  . Hypertensive renal disease 11/08/2017  . Secondary  hyperparathyroidism of renal origin ([place]) 11/08/2017  . QT prolongation 10/27/2017  . Osteopenia 10/05/2017  . Choledocholithiasis 08/13/2017  . Chronic pain syndrome 04/05/2017  . Depression 11/15/2016  . Iron deficiency anemia 09/08/2016  . Anemia 06/29/2016  . Late onset Alzheimer's disease with behavioral disturbance ([place]) 06/14/2016  . S/P AKA (above knee amputation) unilateral, left ([place]) 06/08/2016  . Wheelchair dependent 03/11/2016  . 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 S/p nephrectomy 01/29/2015  . Renal neoplasm 01/15/2015  . Abnormal EKG 01/02/2015  . Renal mass 12/05/2014  . GERD (gastroesophageal reflux disease) 11/20/2014  . GAD (generalized anxiety disorder) 11/20/2014  . AB (asthmatic bronchitis) 11/20/2014  . Aortic atherosclerosis ([place]) 11/20/2014  . Cataract 11/20/2014  . Leg pain 11/20/2014  . Continuous opioid dependence ([place]) 11/20/2014  . CKD (chronic kidney disease) stage 4, GFR 15-29 ml/min (HCC) 11/20/2014  . COPD (chronic obstructive pulmonary disease) ([place]) 11/20/2014  . Excessive falling 11/20/2014  . Fatty infiltration of liver 11/20/2014  . Hyperlipidemia 11/20/2014  . Anterior knee pain 11/20/2014  . LBP (low back pain) 11/20/2014  . Malaise and fatigue 11/20/2014  . Abnormal presence of protein in urine 11/20/2014  . PVD (peripheral vascular disease) ([place]) 11/20/2014  . Allergic rhinitis 11/20/2014  .  At risk for falling 11/20/2014  . Phantom limb syndrome with pain ([place]) 11/05/2014  . Anxiety 11/05/2014  . Elevated serum creatinine 11/05/2014  . Diabetes mellitus type 2, controlled ([place]) 11/05/2014  . H/O adenomatous polyp of colon 07/27/2010    Past Surgical History:  Procedure Laterality Date  . ABDOMINAL HYSTERECTOMY  1978  . CARPAL TUNNEL RELEASE Bilateral   . CHOLECYSTECTOMY    . COLONOSCOPY    . COLONOSCOPY WITH ESOPHAGOGASTRODUODENOSCOPY (EGD)    . ERCP N/A 08/19/2017   Procedure: ENDOSCOPIC  RETROGRADE CHOLANGIOPANCREATOGRAPHY (ERCP);  Surgeon: Arta Silence, MD;  Location: [facility] ENDOSCOPY;  Service: Endoscopy;  Laterality: N/A;  . ESOPHAGOGASTRODUODENOSCOPY (EGD) WITH PROPOFOL N/A 10/12/2018   Procedure: ESOPHAGOGASTRODUODENOSCOPY (EGD) WITH PROPOFOL;  Surgeon: Virgel Manifold, MD;  Location: [facility] ENDOSCOPY;  Service: Endoscopy;  Laterality: N/A;  . LEG AMPUTATION     above the knee / left   . ROBOT ASSISTED LAPAROSCOPIC NEPHRECTOMY Left 01/15/2015   Procedure: ROBOTIC ASSISTED LAPAROSCOPIC RADICAL NEPHRECTOMY;  Surgeon: Alexis Frock, MD;  Location: [facility] ORS;  Service: Urology;  Laterality: Left;  . stent placement right leg       Prior to Admission medications   Medication Sig Start Date End Date Taking? Authorizing Provider  acetaminophen (TYLENOL) 325 MG tablet Take 325 mg by mouth every 6 (six) hours as needed.    [provider]  albuterol (VENTOLIN HFA) 108 (90 Base) MCG/ACT inhaler Inhale 1-2 puffs into the lungs every 4 (four) hours as needed.  06/15/17   [provider]  Ascorbic Acid (VITAMIN C) 1000 MG tablet Take 1,000 mg by mouth daily.    [provider]  atorvastatin (LIPITOR) 20 MG tablet TAKE 1 TABLET EVERY NIGHT AT BEDTIME 10/17/18   Poulose, Bethel Born, NP  ciprofloxacin (CIPRO) 250 MG tablet Take 250 mg by mouth 2 (two) times daily.  11/01/18   [provider]  clopidogrel (PLAVIX) 75 MG tablet TAKE 1 TABLET EVERY DAY 09/14/18   Algernon Huxley, MD  donepezil (ARICEPT) 10 MG tablet Take 1 tablet (10 mg total) by mouth at bedtime. 10/06/17   Arnetha Courser, MD  famotidine (PEPCID) 20 MG tablet Take 20 mg by mouth daily.  04/05/18   [provider]  FLUoxetine (PROZAC) 10 MG capsule Take 1 capsule (10 mg total) by mouth daily. 07/22/18   Nicholes Mango, MD  gabapentin (NEURONTIN) 300 MG capsule Take 1 capsule (300 mg total) by mouth 3 (three) times daily. 06/21/18   Gillis Santa, MD  HYDROcodone-acetaminophen (NORCO) 7.5-325  MG tablet Take 1 tablet by mouth every 8 (eight) hours as needed for up to 30 days for severe pain. Must last 30 days. 11/22/18 12/22/18  Gillis Santa, MD  ipratropium-albuterol (DUONEB) 0.5-2.5 (3) MG/3ML SOLN INHALE THE CONTENTS OF 1 VIAL VIA NEBULIZER THREE TIMES DAILY AS DIRECTED Patient taking differently: Inhale 3 mLs into the lungs 3 (three) times daily.  11/10/17   Laverle Hobby, MD  pantoprazole (PROTONIX) 40 MG tablet Take 1 tablet (40 mg total) by mouth daily. 11/14/18   Poulose, Bethel Born, NP  QUEtiapine (SEROQUEL) 25 MG tablet Take 1 tablet (25 mg total) by mouth See admin instructions. Take 1 tablet in a.m. and 1 tablet in the afternoon and 2 tablets(50 mg total) before bedtime around 8 to 9 PM 07/27/18   Vaughan Basta, MD    Allergies Fentanyl and Penicillins  Family History  Problem Relation Age of Onset  . Cancer Mother  Stomach  . Cancer Father   . Diabetes Brother   . Cancer Brother        oral  . Healthy Son   . Healthy Sister     Social History Social History   Tobacco Use  . Smoking status: Former Smoker    Packs/day: 1.50    Years: 51.00    Pack years: 76.50    Types: Cigarettes    Quit date: 05/24/2004    Years since quitting: 14.5  . Smokeless tobacco: Never Used  . Tobacco comment: smoking cessation materials not required  Substance Use Topics  . Alcohol use: Not Currently  . Drug use: No    Review of Systems Constitutional: No fever/chills Eyes: No visual changes. ENT: No sore throat. Cardiovascular: Denies chest pain. Respiratory: Denies shortness of breath. Gastrointestinal: No abdominal pain.  No nausea, no vomiting.  No diarrhea.  No constipation. Genitourinary: Negative for dysuria. Musculoskeletal: Negative for neck pain.  Negative for back pain. Integumentary: Negative for rash. Neurological: Negative for headaches, focal weakness or numbness.  ____________________________________________   PHYSICAL EXAM:   VITAL SIGNS: ED Triage Vitals  Enc Vitals Group     BP 11/20/18 1516 (!) 130/55     Pulse Rate 11/20/18 1512 (!) 113     Resp 11/20/18 1512 (!) 26     Temp 11/20/18 1512 99.8 F (37.7 C)     Temp Source 11/20/18 1512 Oral     SpO2 11/20/18 1512 (!) 85 %     Weight 11/20/18 1515 71 kg (156 lb 8.4 oz)     Height 11/20/18 1515 1.575 m (5\' 2" )     Head Circumference --      Peak Flow --      Pain Score 11/20/18 1514 8     Pain Loc --      Pain Edu? --      Excl. in [place]? --     Constitutional: Alert and oriented. Well appearing and in no acute distress. Eyes: Conjunctivae are normal. PERRL. EOMI. Head: 4 and 3 cm linear lacerations to the mid forehead. Mouth/Throat: Mucous membranes are moist.  Oropharynx non-erythematous. Neck: No stridor.  No cervical spine tenderness to palpation. Cardiovascular: Normal rate, regular rhythm. Good peripheral circulation. Grossly normal heart sounds. Respiratory: Normal respiratory effort.  No retractions. No audible wheezing. Gastrointestinal: Soft and nontender. No distention.  Musculoskeletal: No lower extremity tenderness nor edema. No gross deformities of extremities. Neurologic:  Normal speech and language. No gross focal neurologic deficits are appreciated.  Skin:  Skin is warm, dry and intact. No rash noted. Psychiatric: Mood and affect are normal. Speech and behavior are normal.  ____________________________________________   LABS (all labs ordered are listed, but only abnormal results are displayed)  Labs Reviewed - No data to display ____________________________________________   RADIOLOGY I, [place], personally viewed and evaluated these images (plain radiographs) as part of my medical decision making, as well as reviewing the written report by the radiologist.  ED MD interpretation: No acute intracranial abnormality noted on CT head or cervical spine per radiologist.  Chest x-ray revealed progression of left pleural  effusion with mild left lower lobe atelectasis per radiologist.  Official radiology report(s): No results found.   [name].Laceration Repair  Date/Time: 11/21/2018 10:43 PM Performed by: Gregor Hams, MD Authorized by: Gregor Hams, MD   Consent:    Consent obtained:  Verbal   Consent given by:  Patient   Risks discussed:  Infection, pain,  retained foreign body, poor cosmetic result and poor wound healing Anesthesia (see MAR for exact dosages):    Anesthesia method:  Local infiltration   Local anesthetic:  Lidocaine 1% w/o epi Laceration details:    Location:  Face   Face location:  Forehead   Length (cm):  4 Repair type:    Repair type:  Simple Exploration:    Hemostasis achieved with:  Direct pressure   Wound exploration: entire depth of wound probed and visualized     Contaminated: no   Treatment:    Area cleansed with:  Saline   Amount of cleaning:  Extensive   Irrigation solution:  Sterile saline   Visualized foreign bodies/material removed: no   Skin repair:    Repair method:  Sutures   Suture size:  6-0   Suture material:  Nylon   Suture technique:  Simple interrupted   Number of sutures:  5 Approximation:    Approximation:  Close Post-procedure details:    Dressing:  Sterile dressing   Patient tolerance of procedure:  Tolerated well, no immediate complications .Marland KitchenLaceration Repair  Date/Time: 11/21/2018 10:43 PM Performed by: Gregor Hams, MD Authorized by: Gregor Hams, MD   Consent:    Consent obtained:  Verbal   Consent given by:  Patient   Risks discussed:  Infection, pain, retained foreign body, poor cosmetic result and poor wound healing Anesthesia (see MAR for exact dosages):    Anesthesia method:  Local infiltration   Local anesthetic:  Lidocaine 1% w/o epi Laceration details:    Location:  Face   Face location:  Forehead   Length (cm):  3 Repair type:    Repair type:  Simple Exploration:    Hemostasis achieved with:  Direct  pressure   Wound exploration: entire depth of wound probed and visualized     Contaminated: no   Treatment:    Area cleansed with:  Saline   Amount of cleaning:  Extensive   Irrigation solution:  Sterile saline   Visualized foreign bodies/material removed: no   Skin repair:    Repair method:  Sutures   Suture size:  6-0   Suture material:  Nylon   Suture technique:  Simple interrupted   Number of sutures:  3 Approximation:    Approximation:  Close Post-procedure details:    Dressing:  Sterile dressing   Patient tolerance of procedure:  Tolerated well, no immediate complications     ____________________________________________   INITIAL IMPRESSION / MDM / ASSESSMENT AND PLAN / ED COURSE  As part of my medical decision making, I reviewed the following data within the electronic MEDICAL RECORD NUMBER 83 year old female presenting with above-stated history and physical exam after reported accidental fall.  Patient's wounds repaired without difficulty.  Patient currently on baseline 2-1/2 L of oxygen via nasal cannula satting 93- 96% without any apparent increased work of breathing.  *Amy [name] was evaluated in Emergency Department on 11/21/2018 for the symptoms described in the history of present illness. She was evaluated in the context of the global COVID-19 pandemic, which necessitated consideration that the patient might be at risk for infection with the SARS-CoV-2 virus that causes COVID-19. Institutional protocols and algorithms that pertain to the evaluation of patients at risk for COVID-19 are in a state of rapid change based on information released by regulatory bodies including the CDC and federal and state organizations. These policies and algorithms were followed during the patient's care in the ED.  Some ED evaluations and  interventions may be delayed as a result of limited staffing during the pandemic.*        ____________________________________________  FINAL  CLINICAL IMPRESSION(S) / ED DIAGNOSES  Final diagnoses:  Injury of head, initial encounter  Facial laceration, initial encounter     MEDICATIONS GIVEN DURING THIS VISIT:  Medications  lidocaine (PF) (XYLOCAINE) 1 % injection 5 mL (5 mLs Intradermal Given 11/20/18 1708)  neomycin-bacitracin-polymyxin (NEOSPORIN) ointment packet ( Topical Given 11/20/18 1735)     ED Discharge Orders    None       Note:  This document was prepared using Dragon voice recognition software and may include unintentional dictation errors.   Gregor Hams, MD 11/21/18 [phone]

## 2018-11-22 ENCOUNTER — Other Ambulatory Visit: Payer: Self-pay

## 2018-11-22 ENCOUNTER — Ambulatory Visit
Admission: RE | Admit: 2018-11-22 | Discharge: 2018-11-22 | Disposition: A | Discharge status: Home / Self Care | Payer: Medicare HMO | Source: Ambulatory Visit | Attending: Radiation Oncology | Admitting: Radiation Oncology

## 2018-11-22 DIAGNOSIS — Z510 Encounter for antineoplastic radiation therapy: Secondary | ICD-10-CM | POA: Diagnosis not present

## 2018-11-22 DIAGNOSIS — C162 Malignant neoplasm of body of stomach: Secondary | ICD-10-CM | POA: Diagnosis not present

## 2018-11-23 ENCOUNTER — Other Ambulatory Visit: Payer: Self-pay

## 2018-11-23 ENCOUNTER — Ambulatory Visit
Admission: RE | Admit: 2018-11-23 | Discharge: 2018-11-23 | Disposition: A | Discharge status: Home / Self Care | Payer: Medicare HMO | Source: Ambulatory Visit | Attending: Radiation Oncology | Admitting: Radiation Oncology

## 2018-11-23 DIAGNOSIS — Z510 Encounter for antineoplastic radiation therapy: Secondary | ICD-10-CM | POA: Diagnosis not present

## 2018-11-23 DIAGNOSIS — C162 Malignant neoplasm of body of stomach: Secondary | ICD-10-CM | POA: Diagnosis not present

## 2018-11-27 ENCOUNTER — Ambulatory Visit
Admission: RE | Admit: 2018-11-27 | Discharge: 2018-11-27 | Disposition: A | Discharge status: Home / Self Care | Payer: Medicare HMO | Source: Ambulatory Visit | Attending: Radiation Oncology | Admitting: Radiation Oncology

## 2018-11-27 ENCOUNTER — Other Ambulatory Visit: Payer: Self-pay

## 2018-11-27 DIAGNOSIS — C162 Malignant neoplasm of body of stomach: Secondary | ICD-10-CM | POA: Diagnosis not present

## 2018-11-27 DIAGNOSIS — Z510 Encounter for antineoplastic radiation therapy: Secondary | ICD-10-CM | POA: Diagnosis not present

## 2018-11-28 ENCOUNTER — Other Ambulatory Visit: Payer: Self-pay

## 2018-11-28 ENCOUNTER — Ambulatory Visit (INDEPENDENT_AMBULATORY_CARE_PROVIDER_SITE_OTHER): Payer: Medicare HMO | Admitting: Family Medicine

## 2018-11-28 ENCOUNTER — Encounter: Payer: Self-pay | Admitting: Family Medicine

## 2018-11-28 ENCOUNTER — Ambulatory Visit
Admission: RE | Admit: 2018-11-28 | Discharge: 2018-11-28 | Disposition: A | Discharge status: Home / Self Care | Payer: Medicare HMO | Source: Ambulatory Visit | Attending: Radiation Oncology | Admitting: Radiation Oncology

## 2018-11-28 VITALS — BP 130/84 | HR 85 | Temp 97.3°F | Resp 14

## 2018-11-28 DIAGNOSIS — S01111D Laceration without foreign body of right eyelid and periocular area, subsequent encounter: Secondary | ICD-10-CM | POA: Diagnosis not present

## 2018-11-28 DIAGNOSIS — S0181XD Laceration without foreign body of other part of head, subsequent encounter: Secondary | ICD-10-CM

## 2018-11-28 DIAGNOSIS — Z9181 History of falling: Secondary | ICD-10-CM | POA: Diagnosis not present

## 2018-11-28 DIAGNOSIS — Z4802 Encounter for removal of sutures: Secondary | ICD-10-CM

## 2018-11-28 DIAGNOSIS — Z510 Encounter for antineoplastic radiation therapy: Secondary | ICD-10-CM | POA: Diagnosis not present

## 2018-11-28 DIAGNOSIS — C162 Malignant neoplasm of body of stomach: Secondary | ICD-10-CM | POA: Diagnosis not present

## 2018-11-28 NOTE — Progress Notes (Addendum)
Name: Amy [name]   MRN: [id number]    DOB: Oct 20, 1935   Date:11/28/2018       Progress Note  Subjective  Chief Complaint  Chief Complaint  Patient presents with  . Follow-up    ER for fall  . Vaginal Pain    swelling/irritation and itching    HPI  Pt presents for ER follow up.  She fell out of her wheelchair when going down a ramp and had a laceration of the RIGHT forehead.  She had CT head and C-spine which showed no acute abnormality; CXR did show progression of left pleural effusion and LLL atelectasis.  She has 7 sutures to the forehead that need removal today.  She still has bruising above the right eye that is improving, no swelling.   Patient Active Problem List   Diagnosis Date Noted  . B12 deficiency 11/05/2018  . Folate deficiency 11/05/2018  . Gastric cancer ([place])   . Stomach irritation   . Abnormal finding on GI tract imaging   . Obesity (BMI 30.0-34.9) 09/11/2018  . Abnormal gastrointestinal PET scan 08/27/2018  . Pulmonary nodules 08/27/2018  . Goals of care, counseling/discussion   . Palliative care by specialist   . Hypoxia 07/26/2018  . HCAP (healthcare-associated pneumonia) 07/02/2018  . Hypoxemia   . COPD exacerbation ([place])   . Renal cell carcinoma ([place])   . Lung nodule 04/23/2018  . Hypertensive renal disease 11/08/2017  . Secondary hyperparathyroidism of renal origin ([place]) 11/08/2017  . QT prolongation 10/27/2017  . Osteopenia 10/05/2017  . Choledocholithiasis 08/13/2017  . Chronic pain syndrome 04/05/2017  . Depression 11/15/2016  . Iron deficiency anemia 09/08/2016  . Anemia 06/29/2016  . Late onset Alzheimer's disease with behavioral disturbance ([place]) 06/14/2016  . S/P AKA (above knee amputation) unilateral, left ([place]) 06/08/2016  . Wheelchair dependent 03/11/2016  . 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 ([place]) 03/13/2015  . S/p nephrectomy 01/29/2015  . Renal neoplasm 01/15/2015  . Abnormal EKG 01/02/2015  . Renal mass  12/05/2014  . GERD (gastroesophageal reflux disease) 11/20/2014  . GAD (generalized anxiety disorder) 11/20/2014  . AB (asthmatic bronchitis) 11/20/2014  . Aortic atherosclerosis ([place]) 11/20/2014  . Cataract 11/20/2014  . Leg pain 11/20/2014  . Continuous opioid dependence ([place]) 11/20/2014  . CKD (chronic kidney disease) stage 4, GFR 15-29 ml/min (HCC) 11/20/2014  . COPD (chronic obstructive pulmonary disease) ([place]) 11/20/2014  . Excessive falling 11/20/2014  . Fatty infiltration of liver 11/20/2014  . Hyperlipidemia 11/20/2014  . Anterior knee pain 11/20/2014  . LBP (low back pain) 11/20/2014  . Malaise and fatigue 11/20/2014  . Abnormal presence of protein in urine 11/20/2014  . PVD (peripheral vascular disease) ([place]) 11/20/2014  . Allergic rhinitis 11/20/2014  . At risk for falling 11/20/2014  . Phantom limb syndrome with pain ([place]) 11/05/2014  . Anxiety 11/05/2014  . Elevated serum creatinine 11/05/2014  . Diabetes mellitus type 2, controlled ([place]) 11/05/2014  . H/O adenomatous polyp of colon 07/27/2010    Past Surgical History:  Procedure Laterality Date  . ABDOMINAL HYSTERECTOMY  1978  . CARPAL TUNNEL RELEASE Bilateral   . CHOLECYSTECTOMY    . COLONOSCOPY    . COLONOSCOPY WITH ESOPHAGOGASTRODUODENOSCOPY (EGD)    . ERCP N/A 08/19/2017   Procedure: ENDOSCOPIC RETROGRADE CHOLANGIOPANCREATOGRAPHY (ERCP);  Surgeon: Arta Silence, MD;  Location: [facility] ENDOSCOPY;  Service: Endoscopy;  Laterality: N/A;  . ESOPHAGOGASTRODUODENOSCOPY (EGD) WITH PROPOFOL N/A 10/12/2018   Procedure: ESOPHAGOGASTRODUODENOSCOPY (EGD) WITH PROPOFOL;  Surgeon: Virgel Manifold,  MD;  Location: [facility] ENDOSCOPY;  Service: Endoscopy;  Laterality: N/A;  . LEG AMPUTATION     above the knee / left   . ROBOT ASSISTED LAPAROSCOPIC NEPHRECTOMY Left 01/15/2015   Procedure: ROBOTIC ASSISTED LAPAROSCOPIC RADICAL NEPHRECTOMY;  Surgeon: Alexis Frock, MD;  Location: [facility] ORS;  Service: Urology;  Laterality: Left;  .  stent placement right leg       Family History  Problem Relation Age of Onset  . Cancer Mother        Stomach  . Cancer Father   . Diabetes Brother   . Cancer Brother        oral  . Healthy Son   . Healthy Sister     Social History   Socioeconomic History  . Marital status: Married    Spouse name: [name]  . Number of children: 1  . Years of education: Not on file  . Highest education level: 10th grade  Occupational History  . Occupation: Retired  Scientific laboratory technician  . Financial resource strain: Not hard at all  . Food insecurity    Worry: Never true    Inability: Never true  . Transportation needs    Medical: No    Non-medical: No  Tobacco Use  . Smoking status: Former Smoker    Packs/day: 1.50    Years: 51.00    Pack years: 76.50    Types: Cigarettes    Quit date: 05/24/2004    Years since quitting: 14.5  . Smokeless tobacco: Never Used  . Tobacco comment: smoking cessation materials not required  Substance and Sexual Activity  . Alcohol use: Not Currently  . Drug use: No  . Sexual activity: Not Currently  Lifestyle  . Physical activity    Days per week: 0 days    Minutes per session: 0 min  . Stress: Very much  Relationships  . Social Herbalist on phone: Patient refused    Gets together: Patient refused    Attends religious service: Never    Active member of club or organization: No    Attends meetings of clubs or organizations: Never    Relationship status: Married  . Intimate partner violence    Fear of current or ex partner: No    Emotionally abused: No    Physically abused: No    Forced sexual activity: No  Other Topics Concern  . Not on file  Social History Narrative  . Not on file     Current Outpatient Medications:  .  acetaminophen (TYLENOL) 325 MG tablet, Take 325 mg by mouth every 6 (six) hours as needed., Disp: , Rfl:  .  Ascorbic Acid (VITAMIN C) 1000 MG tablet, Take 1,000 mg by mouth daily., Disp: , Rfl:  .  atorvastatin  (LIPITOR) 20 MG tablet, TAKE 1 TABLET EVERY NIGHT AT BEDTIME, Disp: 90 tablet, Rfl: 0 .  clopidogrel (PLAVIX) 75 MG tablet, TAKE 1 TABLET EVERY DAY, Disp: 90 tablet, Rfl: 3 .  donepezil (ARICEPT) 10 MG tablet, Take 1 tablet (10 mg total) by mouth at bedtime., Disp: 90 tablet, Rfl: 3 .  famotidine (PEPCID) 20 MG tablet, Take 20 mg by mouth daily. , Disp: , Rfl:  .  gabapentin (NEURONTIN) 300 MG capsule, Take 1 capsule (300 mg total) by mouth 3 (three) times daily., Disp: 270 capsule, Rfl: 3 .  HYDROcodone-acetaminophen (NORCO) 7.5-325 MG tablet, Take 1 tablet by mouth every 8 (eight) hours as needed for up to 30 days for severe pain.  Must last 30 days., Disp: 90 tablet, Rfl: 0 .  ipratropium-albuterol (DUONEB) 0.5-2.5 (3) MG/3ML SOLN, INHALE THE CONTENTS OF 1 VIAL VIA NEBULIZER THREE TIMES DAILY AS DIRECTED (Patient taking differently: Inhale 3 mLs into the lungs 3 (three) times daily. ), Disp: 810 mL, Rfl: 2 .  pantoprazole (PROTONIX) 40 MG tablet, Take 1 tablet (40 mg total) by mouth daily., Disp: 90 tablet, Rfl: 0 .  QUEtiapine (SEROQUEL) 25 MG tablet, Take 1 tablet (25 mg total) by mouth See admin instructions. Take 1 tablet in a.m. and 1 tablet in the afternoon and 2 tablets(50 mg total) before bedtime around 8 to 9 PM, Disp: 150 tablet, Rfl: 0 .  Thiamine HCl (VITAMIN B-1) 250 MG tablet, Take 250 mg by mouth daily., Disp: , Rfl:  .  albuterol (VENTOLIN HFA) 108 (90 Base) MCG/ACT inhaler, Inhale 1-2 puffs into the lungs every 4 (four) hours as needed. , Disp: , Rfl:  .  ciprofloxacin (CIPRO) 250 MG tablet, Take 250 mg by mouth 2 (two) times daily. , Disp: , Rfl:  .  FLUoxetine (PROZAC) 10 MG capsule, Take 1 capsule (10 mg total) by mouth daily. (Patient not taking: Reported on 11/28/2018), Disp: 30 capsule, Rfl: 0 .  QUEtiapine (SEROQUEL) 50 MG tablet, Take 50 mg by mouth at bedtime., Disp: , Rfl:  .  vitamin C (ASCORBIC ACID) 250 MG tablet, Take 250 mg by mouth daily., Disp: , Rfl:   Allergies   Allergen Reactions  . Fentanyl Other (See Comments)    urine retention  . Penicillins Itching, Rash and Other (See Comments)    Has patient had a PCN reaction causing immediate rash, facial/tongue/throat swelling, SOB or lightheadedness with hypotension: Yes Has patient had a PCN reaction causing severe rash involving mucus membranes or skin necrosis: No Has patient had a PCN reaction that required hospitalization: No Has patient had a PCN reaction occurring within the last 10 years: Yes If all of the above answers are "NO", then may proceed with Cephalosporin use.    I personally reviewed active problem list, medication list, notes from last encounter, lab results with the patient/caregiver today.   ROS  Ten systems reviewed and is negative except as mentioned in HPI  Objective  Vitals:   11/28/18 0945  BP: 130/84  Pulse: 85  Resp: 14  Temp: (!) 97.3 F (36.3 C)  SpO2: 99%    There is no height or weight on file to calculate BMI.  Physical Exam Constitutional: Patient appears well-developed and well-nourished. No distress.  HENT: Head: Normocephalic and atraumatic. Ears: bilateral TMs with no erythema or effusion; Nose: Nose normal. Mouth/Throat: Oropharynx is clear and moist. No oropharyngeal exudate or tonsillar swelling.  Eyes: Conjunctivae and EOM are normal. No scleral icterus.  Pupils are equal, round, and reactive to light.  Neck: Normal range of motion. Neck supple. No JVD present. No thyromegaly present.  Cardiovascular: Normal rate, regular rhythm and normal heart sounds.  No murmur heard. No BLE edema. Pulmonary/Chest: Effort normal and breath sounds normal. No respiratory distress. Abdominal: Soft. Bowel sounds are normal, no distension. There is no tenderness. No masses. Musculoskeletal: Wheelchair bound Neurological: Pt is alert and oriented to person, place, and time. No cranial nerve deficit. Coordination, balance, strength, speech and gait are normal.   Skin: Skin is warm and dry. There are 6 sutures above the right eyebrow along the forehead; there is yellow/green bruising in the right forehead just above the right eye, Psychiatric: Patient has a normal  mood and affect. behavior is normal. Judgment and thought content normal.  No results found for this or any previous visit (from the past 72 hour(s)).  PHQ2/9: Depression screen [facility] 2/9 11/28/2018 09/21/2018 07/12/2018 07/11/2018 03/28/2018  Decreased Interest 0 0 0 1 0  Down, Depressed, Hopeless 0 1 1 1  0  PHQ - 2 Score 0 1 1 2  0  Altered sleeping 0 0 0 1 -  Tired, decreased energy 0 1 1 0 -  Change in appetite 0 0 0 0 -  Feeling bad or failure about yourself  0 0 0 0 -  Trouble concentrating 0 0 0 1 -  Moving slowly or fidgety/restless 0 0 0 0 -  Suicidal thoughts 0 0 0 0 -  PHQ-9 Score 0 2 2 4  -  Difficult doing work/chores Not difficult at all Not difficult at all Not difficult at all Somewhat difficult -  Some recent data might be hidden   PHQ-2/9 Result is negative.    Fall Risk: Fall Risk  11/28/2018 09/21/2018 09/11/2018 07/12/2018 06/21/2018  Falls in the past year? 1 0 0 1 0  Comment - - - - -  Number falls in past yr: 1 0 - 1 -  Injury with Fall? 1 0 - 1 -  Risk Factor Category  - - - - -  Risk for fall due to : - Impaired balance/gait;Impaired mobility - History of fall(s);Impaired balance/gait;Impaired mobility -  Follow up - Falls evaluation completed - - -    Functional Status Survey: Is the patient deaf or have difficulty hearing?: No Does the patient have difficulty seeing, even when wearing glasses/contacts?: No Does the patient have difficulty concentrating, remembering, or making decisions?: Yes Does the patient have difficulty walking or climbing stairs?: Yes Does the patient have difficulty dressing or bathing?: Yes Does the patient have difficulty doing errands alone such as visiting a doctor's office or shopping?: Yes  Assessment & Plan 1. History of recent  fall - Skilled nursing coming to the home weekly.   2. Laceration of eyebrow and forehead, right, subsequent encounter - Appears to be healing well, edges well approximated, no signs of secondary infection  3. Visit for suture removal - 7 sutures removed from the forehead. Clean bandage applied

## 2018-11-29 ENCOUNTER — Ambulatory Visit
Admission: RE | Admit: 2018-11-29 | Discharge: 2018-11-29 | Disposition: A | Discharge status: Home / Self Care | Payer: Medicare HMO | Source: Ambulatory Visit | Attending: Radiation Oncology | Admitting: Radiation Oncology

## 2018-11-29 ENCOUNTER — Other Ambulatory Visit: Payer: Self-pay

## 2018-11-29 DIAGNOSIS — Z510 Encounter for antineoplastic radiation therapy: Secondary | ICD-10-CM | POA: Diagnosis not present

## 2018-11-29 DIAGNOSIS — C162 Malignant neoplasm of body of stomach: Secondary | ICD-10-CM | POA: Diagnosis not present

## 2018-11-30 ENCOUNTER — Other Ambulatory Visit: Payer: Self-pay

## 2018-11-30 ENCOUNTER — Ambulatory Visit
Admission: RE | Admit: 2018-11-30 | Discharge: 2018-11-30 | Disposition: A | Discharge status: Home / Self Care | Payer: Medicare HMO | Source: Ambulatory Visit | Attending: Radiation Oncology | Admitting: Radiation Oncology

## 2018-11-30 ENCOUNTER — Inpatient Hospital Stay: Payer: Medicare HMO | Attending: Radiation Oncology

## 2018-11-30 DIAGNOSIS — C162 Malignant neoplasm of body of stomach: Secondary | ICD-10-CM | POA: Diagnosis not present

## 2018-11-30 DIAGNOSIS — R32 Unspecified urinary incontinence: Secondary | ICD-10-CM | POA: Insufficient documentation

## 2018-11-30 DIAGNOSIS — Z85520 Personal history of malignant carcinoid tumor of kidney: Secondary | ICD-10-CM | POA: Insufficient documentation

## 2018-11-30 DIAGNOSIS — Z906 Acquired absence of other parts of urinary tract: Secondary | ICD-10-CM | POA: Insufficient documentation

## 2018-11-30 DIAGNOSIS — J441 Chronic obstructive pulmonary disease with (acute) exacerbation: Secondary | ICD-10-CM | POA: Diagnosis not present

## 2018-11-30 DIAGNOSIS — N390 Urinary tract infection, site not specified: Secondary | ICD-10-CM | POA: Diagnosis not present

## 2018-11-30 DIAGNOSIS — F0390 Unspecified dementia without behavioral disturbance: Secondary | ICD-10-CM | POA: Insufficient documentation

## 2018-11-30 DIAGNOSIS — E1122 Type 2 diabetes mellitus with diabetic chronic kidney disease: Secondary | ICD-10-CM | POA: Diagnosis not present

## 2018-11-30 DIAGNOSIS — R451 Restlessness and agitation: Secondary | ICD-10-CM | POA: Insufficient documentation

## 2018-11-30 DIAGNOSIS — N766 Ulceration of vulva: Secondary | ICD-10-CM | POA: Insufficient documentation

## 2018-11-30 DIAGNOSIS — Z87891 Personal history of nicotine dependence: Secondary | ICD-10-CM | POA: Insufficient documentation

## 2018-11-30 DIAGNOSIS — Z7951 Long term (current) use of inhaled steroids: Secondary | ICD-10-CM | POA: Diagnosis not present

## 2018-11-30 DIAGNOSIS — C169 Malignant neoplasm of stomach, unspecified: Secondary | ICD-10-CM | POA: Insufficient documentation

## 2018-11-30 DIAGNOSIS — R159 Full incontinence of feces: Secondary | ICD-10-CM | POA: Insufficient documentation

## 2018-11-30 DIAGNOSIS — Z89612 Acquired absence of left leg above knee: Secondary | ICD-10-CM | POA: Diagnosis not present

## 2018-11-30 DIAGNOSIS — N183 Chronic kidney disease, stage 3 (moderate): Secondary | ICD-10-CM | POA: Diagnosis not present

## 2018-11-30 DIAGNOSIS — B373 Candidiasis of vulva and vagina: Secondary | ICD-10-CM | POA: Insufficient documentation

## 2018-11-30 DIAGNOSIS — Z510 Encounter for antineoplastic radiation therapy: Secondary | ICD-10-CM | POA: Diagnosis not present

## 2018-11-30 DIAGNOSIS — F419 Anxiety disorder, unspecified: Secondary | ICD-10-CM | POA: Insufficient documentation

## 2018-11-30 DIAGNOSIS — J449 Chronic obstructive pulmonary disease, unspecified: Secondary | ICD-10-CM | POA: Diagnosis not present

## 2018-12-01 ENCOUNTER — Other Ambulatory Visit: Payer: Self-pay

## 2018-12-01 ENCOUNTER — Ambulatory Visit
Admission: RE | Admit: 2018-12-01 | Discharge: 2018-12-01 | Disposition: A | Discharge status: Home / Self Care | Payer: Medicare HMO | Source: Ambulatory Visit | Attending: Radiation Oncology | Admitting: Radiation Oncology

## 2018-12-01 ENCOUNTER — Ambulatory Visit: Payer: Medicare HMO

## 2018-12-01 DIAGNOSIS — J9601 Acute respiratory failure with hypoxia: Secondary | ICD-10-CM | POA: Diagnosis not present

## 2018-12-01 DIAGNOSIS — Z510 Encounter for antineoplastic radiation therapy: Secondary | ICD-10-CM | POA: Diagnosis not present

## 2018-12-01 DIAGNOSIS — Z7951 Long term (current) use of inhaled steroids: Secondary | ICD-10-CM | POA: Diagnosis not present

## 2018-12-01 DIAGNOSIS — J441 Chronic obstructive pulmonary disease with (acute) exacerbation: Secondary | ICD-10-CM | POA: Diagnosis not present

## 2018-12-01 DIAGNOSIS — G301 Alzheimer's disease with late onset: Secondary | ICD-10-CM

## 2018-12-01 DIAGNOSIS — N183 Chronic kidney disease, stage 3 (moderate): Secondary | ICD-10-CM | POA: Diagnosis not present

## 2018-12-01 DIAGNOSIS — Z993 Dependence on wheelchair: Secondary | ICD-10-CM

## 2018-12-01 DIAGNOSIS — F0281 Dementia in other diseases classified elsewhere with behavioral disturbance: Secondary | ICD-10-CM

## 2018-12-01 DIAGNOSIS — Z89612 Acquired absence of left leg above knee: Secondary | ICD-10-CM | POA: Diagnosis not present

## 2018-12-01 DIAGNOSIS — F0390 Unspecified dementia without behavioral disturbance: Secondary | ICD-10-CM | POA: Diagnosis not present

## 2018-12-01 DIAGNOSIS — E1122 Type 2 diabetes mellitus with diabetic chronic kidney disease: Secondary | ICD-10-CM | POA: Diagnosis not present

## 2018-12-01 DIAGNOSIS — N390 Urinary tract infection, site not specified: Secondary | ICD-10-CM | POA: Diagnosis not present

## 2018-12-01 DIAGNOSIS — Z9181 History of falling: Secondary | ICD-10-CM

## 2018-12-01 DIAGNOSIS — C162 Malignant neoplasm of body of stomach: Secondary | ICD-10-CM | POA: Diagnosis not present

## 2018-12-01 NOTE — Patient Instructions (Signed)
Thank you allowing the Chronic Care Management Team to be a part of your care! It was a pleasure speaking with you today!  1. Please engage with palliative care when they call.  2. You are doing a good job caring for your wife! 3. If you need any assistance that is not provided by palliative care, please call RN CM   CCM (Chronic Care Management) Team   Trish Fountain RN, BSN Nurse Care Coordinator  [phone]  Ruben Reason PharmD  Clinical Pharmacist  [phone]   [place], [place] Social Worker [phone]  Goals Addressed            This Visit's Progress   . per husband "I just don't know how much longer I can care for her" (pt-stated)       Current Barriers:  . Lacks caregiver support.  . Dementia . AKA needing total care for toileting, bathing, transfer  Nurse Case Manager Clinical Goal(s):  [name] Over the next 14 days, caregiver will engage with LCSW Chrystal Land to discuss custodial care options  Interventions:  . Active listening . Provided emotional support . Assessed for caregiver strain and additional family support . Collaboration with palliative care for second outreach attempt (reviewed last note from palliative care NP)  Patient Self Care Activities:  . Patient caregiver will engage with palliative care   Initial goal documentation         The patient verbalized understanding of instructions provided today and declined a print copy of patient instruction materials.   Telephone follow up appointment with care management team member scheduled for:  SYMPTOMS OF A STROKE   You have any symptoms of stroke. "BE FAST" is an easy way to remember the main warning signs: ? B - Balance. Signs are dizziness, sudden trouble walking, or loss of balance. ? E - Eyes. Signs are trouble seeing or a sudden change in how you see. ? F - Face. Signs are sudden weakness or loss of feeling of the face, or the face or eyelid drooping on one  side. ? A - Arms. Signs are weakness or loss of feeling in an arm. This happens suddenly and usually on one side of the body. ? S - Speech. Signs are sudden trouble speaking, slurred speech, or trouble understanding what people say. ? T - Time. Time to call emergency services. Write down what time symptoms started.  You have other signs of stroke, such as: ? A sudden, very bad headache with no known cause. ? Feeling sick to your stomach (nausea). ? Throwing up (vomiting). ? Jerky movements you cannot control (seizure).  SYMPTOMS OF A HEART ATTACK  What are the signs or symptoms? Symptoms of this condition include:  Chest pain. It may feel like: ? Crushing or squeezing. ? Tightness, pressure, fullness, or heaviness.  Pain in the arm, neck, jaw, back, or upper body.  Shortness of breath.  Heartburn.  Indigestion.  Nausea.  Cold sweats.  Feeling tired. Sudden lightheadedness.    Palliative Care Palliative care involves care of body, mind, and spirit in order to improve a person's quality of life. Palliative care services are offered to people dealing with serious and life-threatening illnesses, often in the hospital or in a long-term care setting. The specific services are different for each person and are based on the person's needs and preferences. Palliative care requires a team of people who ensure:  Control of pain and other symptoms.  Family support.  Spiritual  support.  Emotional and social support.  Comfort. Palliative care is a way to bring comfort and peace of mind to a person and his or her family. It can have a positive impact on the person's quality of life and the course of the illness. What is the difference between palliative care and hospice? Palliative care and hospice have similar goals of managing symptoms, promoting comfort, improving quality of life, and maintaining a person's dignity. However, palliative care may be offered during any phase of a  serious illness, while hospice care is usually offered when a person is expected to live for 6 months or less. Who can receive palliative care services? Palliative care is offered to children and adults who are seriously ill. It is often offered in cases where a person:  Is not responding well to treatment.  Needs pain management.  Had treatment or surgery and developed symptoms that are difficult to manage.  Is undergoing a treatment to cure a condition (active treatment), like chemotherapy.  Has a diagnosis of an advanced disease, or a disease that shortens his or her life. A health care provider will usually recommend palliative care services when more support would be helpful. Family members and friends may also receive palliative care services to cope with stress and other concerns. Who makes up the palliative care team? The following people make up a palliative care team:  The person receiving care and his or her family.  Physicians, including primary health care providers and specialists.  Nurses.  A Education officer, museum. Depending on a person's needs, the following people may also be included on a palliative care team:  A pain specialist.  A hospice specialist.  A financial or Research officer, trade union.  Religious or spiritual leaders.  A care coordinator or case manager.  A bereavement coordinator. The team will talk with the person and his or her family about:  The role of the pain specialist and the hospice specialist.  The person's physical symptoms, such as pain, nausea, vomiting, and shortness of breath.  Stress, depression, and anxiety symptoms.  Function and mobility issues and how to stay as active as possible.  Treatment options and how they may affect life.  Spiritual wishes, such as rituals and prayer.  Legacy and memory making activities.  Life and death as a normal process.  Advance directives or living wills, health care proxies, and end-of-life  care.  Any other concerns or issues. The palliative care team will make it okay to talk about difficult issues and topics. They will address spiritual and emotional concerns and give the person's preferences high importance. Summary  Palliative care involves care of body, mind, and spirit in order to improve a person's quality of life.  The specific services are different for each person and are based on the person's needs and preferences.  Palliative care is a way to bring comfort and peace of mind to a person and his or her family.

## 2018-12-01 NOTE — Chronic Care Management (AMB) (Signed)
Chronic Care Management   Initial Visit Note  12/01/2018 Name: Amy [name] MRN: [id number] DOB: 01/13/1936  Subjective: per husband "I just need some help taking care of her and I don't qualify for help" "I have paid for help but she keeps running them off"  Objective:  Assessment: [name] is an 83 year old female patient of [facility] who was referred to chronic case management by her health plan. She has a history of but not limited to renal cell carcinoma, gastric carcinoma currently receiving palliative care, AKA (wheelchair/bed bound), COPD, and late onset Alzheimers. Today RN CM attempted initial health assessment but soon identified severe caregiver strain from husband.  Review of patient status, including review of consultants reports, relevant laboratory and other test results, and collaboration with appropriate care team members and the patient's provider was performed as part of comprehensive patient evaluation and provision of chronic care management services.    3 ED visits/3 Admissions last 6 months   Advanced Directives 11/20/2018  Does Patient Have a Medical Advance Directive? No  Type of Advance Directive -  Does patient want to make changes to medical advance directive? -  Copy of [place] in Chart? -  Would patient like information on creating a medical advance directive? -  Some encounter information is confidential and restricted. Go to Review Flowsheets activity to see all data.     Goals Addressed            This Visit's Progress   . per husband "I just don't know how much longer I can care for her" (pt-stated)        Spoke with husband during today's visit. He admits to being the primary caregiver of patient. He has 2 sons and she has 1. Her relationship with her son is not good. Husband does not receive assistance from sons with caregiving. He has paid for caregivers but states patient  can be "mean" and "yells" and "has run the last caregiver off yesterday". Husband admits that lifting patient is becoming increasingly hard for him. She is only stand and piviot at best. Patient does not sleep well and often wants out of bed in the middle of the night which causes husband sleep depravation. Husband states patient's memory deficit is getting worse as is her pain. He is unsure how much longer he can care for her and provided major assistance with ADLs as his health is suffering. He feels he may have to look at placement in the near future. She is currently receiving palliative radiation therapy for gastric cancer. She is on her 7th daily treatment and husband says she will have 94.  Patient has engaged with palliative care in the past. Last note written 10/09/2018 but has not had a follow up since. When asked if patient was receiving services from palliative care husband stated he was not sure. Spoke with Alyse Low, referrals coordinator with [facility] palliative care. Alyse Low will discuss information provided by RN CM with palliative care NP and arrange a home visit if possible. Discussed need for social work to discuss costodial care options. If palliative care does not re-engage with patient, will refer CCM LCSW Chrystal Land.  Current Barriers:  . Lacks caregiver support.  . Dementia . AKA needing total care for toileting, bathing, transfer  Nurse Case Manager Clinical Goal(s):  [name] Over the next 14 days, caregiver will engage with LCSW Chrystal Land to discuss custodial care options  Interventions:  .  Active listening . Provided emotional support . Assessed for caregiver strain and additional family support . Collaboration with palliative care for second outreach attempt (reviewed last note from palliative care NP)  Patient Self Care Activities:  . Patient caregiver will engage with palliative care   Initial goal documentation         Follow up plan:  Will await call from  palliative care NP. Will follow up with patient's husband in 1 week to ensure engagement with palliative care.    Coraima Tibbs E. Rollene Rotunda, RN, BSN Nurse Care Coordinator [facility] Care Management  [phone]

## 2018-12-04 ENCOUNTER — Other Ambulatory Visit: Payer: Self-pay

## 2018-12-04 ENCOUNTER — Other Ambulatory Visit: Payer: Self-pay | Admitting: Student in an Organized Health Care Education/Training Program

## 2018-12-04 ENCOUNTER — Ambulatory Visit
Admission: RE | Admit: 2018-12-04 | Discharge: 2018-12-04 | Disposition: A | Discharge status: Home / Self Care | Payer: Medicare HMO | Source: Ambulatory Visit | Attending: Radiation Oncology | Admitting: Radiation Oncology

## 2018-12-04 DIAGNOSIS — Z510 Encounter for antineoplastic radiation therapy: Secondary | ICD-10-CM | POA: Diagnosis not present

## 2018-12-04 DIAGNOSIS — C162 Malignant neoplasm of body of stomach: Secondary | ICD-10-CM | POA: Diagnosis not present

## 2018-12-05 ENCOUNTER — Other Ambulatory Visit: Payer: Self-pay

## 2018-12-05 ENCOUNTER — Ambulatory Visit
Admission: RE | Admit: 2018-12-05 | Discharge: 2018-12-05 | Disposition: A | Discharge status: Home / Self Care | Payer: Medicare HMO | Source: Ambulatory Visit | Attending: Radiation Oncology | Admitting: Radiation Oncology

## 2018-12-05 DIAGNOSIS — C162 Malignant neoplasm of body of stomach: Secondary | ICD-10-CM | POA: Diagnosis not present

## 2018-12-05 DIAGNOSIS — Z510 Encounter for antineoplastic radiation therapy: Secondary | ICD-10-CM | POA: Diagnosis not present

## 2018-12-06 ENCOUNTER — Inpatient Hospital Stay (HOSPITAL_BASED_OUTPATIENT_CLINIC_OR_DEPARTMENT_OTHER): Payer: Medicare HMO | Admitting: Nurse Practitioner

## 2018-12-06 ENCOUNTER — Other Ambulatory Visit: Payer: Self-pay | Admitting: *Deleted

## 2018-12-06 ENCOUNTER — Ambulatory Visit
Admission: RE | Admit: 2018-12-06 | Discharge: 2018-12-06 | Disposition: A | Discharge status: Home / Self Care | Payer: Medicare HMO | Source: Ambulatory Visit | Attending: Radiation Oncology | Admitting: Radiation Oncology

## 2018-12-06 ENCOUNTER — Other Ambulatory Visit: Payer: Self-pay

## 2018-12-06 DIAGNOSIS — Z87891 Personal history of nicotine dependence: Secondary | ICD-10-CM | POA: Diagnosis not present

## 2018-12-06 DIAGNOSIS — F419 Anxiety disorder, unspecified: Secondary | ICD-10-CM

## 2018-12-06 DIAGNOSIS — R32 Unspecified urinary incontinence: Secondary | ICD-10-CM | POA: Diagnosis not present

## 2018-12-06 DIAGNOSIS — Z906 Acquired absence of other parts of urinary tract: Secondary | ICD-10-CM | POA: Diagnosis not present

## 2018-12-06 DIAGNOSIS — N766 Ulceration of vulva: Secondary | ICD-10-CM | POA: Diagnosis not present

## 2018-12-06 DIAGNOSIS — B373 Candidiasis of vulva and vagina: Secondary | ICD-10-CM

## 2018-12-06 DIAGNOSIS — C169 Malignant neoplasm of stomach, unspecified: Secondary | ICD-10-CM

## 2018-12-06 DIAGNOSIS — C162 Malignant neoplasm of body of stomach: Secondary | ICD-10-CM | POA: Diagnosis not present

## 2018-12-06 DIAGNOSIS — B3731 Acute candidiasis of vulva and vagina: Secondary | ICD-10-CM

## 2018-12-06 DIAGNOSIS — R159 Full incontinence of feces: Secondary | ICD-10-CM

## 2018-12-06 DIAGNOSIS — F0390 Unspecified dementia without behavioral disturbance: Secondary | ICD-10-CM

## 2018-12-06 DIAGNOSIS — R451 Restlessness and agitation: Secondary | ICD-10-CM

## 2018-12-06 DIAGNOSIS — Z85520 Personal history of malignant carcinoid tumor of kidney: Secondary | ICD-10-CM

## 2018-12-06 DIAGNOSIS — Z510 Encounter for antineoplastic radiation therapy: Secondary | ICD-10-CM | POA: Diagnosis not present

## 2018-12-06 NOTE — Progress Notes (Signed)
Symptom Management [place]  [phone] [phone]  Patient Care Team: Arnetha Courser, MD as PCP - General (Family Medicine) Lequita Asal, MD as Referring Physician (Hematology and Oncology) Lang Snow, NP as Nurse Practitioner (Neurology) Birder Robson, MD as Referring Physician (Ophthalmology) Lucky Cowboy Erskine Squibb, MD as Referring Physician (Vascular Surgery) Dagoberto Ligas, MD as Referring Physician (Pain Medicine) Lavonia Dana, MD as Consulting Physician (Nephrology) Virgel Manifold, MD as Consulting Physician (Gastroenterology) Yolonda Kida, MD as Consulting Physician (Cardiology) Estevan Oaks, NP as Nurse Practitioner (Hospice and Palliative Medicine) Manya Silvas, MD (Gastroenterology) Ok Edwards, NP as Nurse Practitioner (Gastroenterology) Benedetto Goad, RN as Registered Nurse Clent Jacks, RN as Oncology Nurse Navigator   Name of the patient: Amy [name]  [id number]  12/26/1935   Date of visit: 12/06/18  Diagnosis-gastric adenocarcinoma  Chief complaint/ Reason for visit- vulvar pain and discoloration  Heme/Onc history: Patient is 83 year old female with extensive comorbidities including diabetes, left AKA, DVT, CKD, frequent UTI, anxiety, iron deficiency anemia, depression, hypertension, hyperlipidemia, and history of renal cell cancer.  She was diagnosed in 2016 with stage III renal cell carcinoma with positive disease to regional lymph nodes.  She had a PET CT scan which demonstrated a 2.5 cm hypermetabolic lesion just below the GE junction.  She underwent an EGD which showed a mass in the gastric cardia.  Biopsy positive for adenocarcinoma.  EUS showed a mass along the greater curvature of the proximal gastric body.  Staging T2N0.  Mass not amenable to endoscopic removal.  Repeat biopsy showed at least intramucosal adenocarcinoma in the background of  interstitial metaplasia and low-grade high-grade dysplasia.  Husband and son refused surgical intervention.  She was referred to radiation oncology for consideration of palliative radiation.   Interval history- Amy [name], 83 year old female with above history of renal cell carcinoma (2016), gastric adenocarcinoma (2020), and iron deficiency anemia presents to symptom management clinic for complaints of vulvar pain and discoloration.  Patient is poor historian with history of dementia.  Husband who scheduled appointment contributes to HPI by phone.  He says that she is incontinent of urine and sometimes of stool.  While changing her he noticed that her vulva appeared darker in color than normal and she reacted to pain when touching.  He noticed a "sore on the outside".  She denies urinary complaints.  Unsure if it itches, says vulva feels swollen.  Not aware of any discharge but husband says he is not sure either of them would notice.  Unsure odor changes.    ECOG FS:3 - Symptomatic, >50% confined to bed  Review of systems- Review of Systems  Unable to perform ROS: Dementia (gyn- per hpi)  Constitutional: Negative for chills and fever.  Respiratory: Negative for cough and shortness of breath.   Cardiovascular: Negative for chest pain and palpitations.  Gastrointestinal: Negative for constipation and diarrhea.  Genitourinary: Negative for dysuria and frequency.  Skin: Negative for itching and rash.  Neurological: Positive for weakness.  Psychiatric/Behavioral: Positive for depression and memory loss. The patient is nervous/anxious and has insomnia.      Current treatment-palliative radiation  Allergies  Allergen Reactions   Fentanyl Other (See Comments)    urine retention   Penicillins Itching, Rash and Other (See Comments)    Has patient had a PCN reaction causing immediate rash, facial/tongue/throat swelling, SOB or lightheadedness with hypotension: Yes Has patient had a PCN  reaction causing severe rash involving  mucus membranes or skin necrosis: No Has patient had a PCN reaction that required hospitalization: No Has patient had a PCN reaction occurring within the last 10 years: Yes If all of the above answers are "NO", then may proceed with Cephalosporin use.    Past Medical History:  Diagnosis Date   Anemia    low iron   Anxiety    Arthritis    Bilateral cataracts    Bronchitis    hx of    Cataract    Chronic kidney disease    Depression    Diabetes mellitus without complication (HCC)    DVT (deep venous thrombosis) (HCC)    hx of in left leg currently has left AKA    GERD (gastroesophageal reflux disease)    Headache    History of frequent urinary tract infections    Hx of renal cell cancer    LEFT   Hyperlipidemia    Hypertension    Numbness and tingling    right hand    Obesity    Osteopenia 10/05/2017   Peripheral artery disease (HCC)    Urinary frequency    Urinary incontinence     Past Surgical History:  Procedure Laterality Date   ABDOMINAL HYSTERECTOMY  1978   CARPAL TUNNEL RELEASE Bilateral    CHOLECYSTECTOMY     COLONOSCOPY     COLONOSCOPY WITH ESOPHAGOGASTRODUODENOSCOPY (EGD)     ERCP N/A 08/19/2017   Procedure: ENDOSCOPIC RETROGRADE CHOLANGIOPANCREATOGRAPHY (ERCP);  Surgeon: Arta Silence, MD;  Location: [facility] ENDOSCOPY;  Service: Endoscopy;  Laterality: N/A;   ESOPHAGOGASTRODUODENOSCOPY (EGD) WITH PROPOFOL N/A 10/12/2018   Procedure: ESOPHAGOGASTRODUODENOSCOPY (EGD) WITH PROPOFOL;  Surgeon: Virgel Manifold, MD;  Location: [facility] ENDOSCOPY;  Service: Endoscopy;  Laterality: N/A;   LEG AMPUTATION     above the knee / left    ROBOT ASSISTED LAPAROSCOPIC NEPHRECTOMY Left 01/15/2015   Procedure: ROBOTIC ASSISTED LAPAROSCOPIC RADICAL NEPHRECTOMY;  Surgeon: Alexis Frock, MD;  Location: [facility] ORS;  Service: Urology;  Laterality: Left;   stent placement right leg       Social History    Socioeconomic History   Marital status: Married    Spouse name: [name]   Number of children: 1   Years of education: Not on file   Highest education level: 10th grade  Occupational History   Occupation: Retired  Scientist, product/process development strain: Not hard at [employer] insecurity    Worry: Never true    Inability: Never true   Transportation needs    Medical: No    Non-medical: No  Tobacco Use   Smoking status: Former Smoker    Packs/day: 1.50    Years: 51.00    Pack years: 76.50    Types: Cigarettes    Quit date: 05/24/2004    Years since quitting: 14.5   Smokeless tobacco: Never Used   Tobacco comment: smoking cessation materials not required  Substance and Sexual Activity   Alcohol use: Not Currently   Drug use: No   Sexual activity: Not Currently  Lifestyle   Physical activity    Days per week: 0 days    Minutes per session: 0 min   Stress: Very much  Relationships   Social connections    Talks on phone: Patient refused    Gets together: Patient refused    Attends religious service: Never    Active member of club or organization: No    Attends meetings of clubs or organizations: Never  Relationship status: Married   Intimate partner violence    Fear of current or ex partner: No    Emotionally abused: No    Physically abused: No    Forced sexual activity: No  Other Topics Concern   Not on file  Social History Narrative   Not on file    Family History  Problem Relation Age of Onset   Cancer Mother        Stomach   Cancer Father    Diabetes Brother    Cancer Brother        oral   Healthy Son    Healthy Sister      Current Outpatient Medications:    acetaminophen (TYLENOL) 325 MG tablet, Take 325 mg by mouth every 6 (six) hours as needed., Disp: , Rfl:    albuterol (VENTOLIN HFA) 108 (90 Base) MCG/ACT inhaler, Inhale 1-2 puffs into the lungs every 4 (four) hours as needed. , Disp: , Rfl:    Ascorbic Acid  (VITAMIN C) 1000 MG tablet, Take 1,000 mg by mouth daily., Disp: , Rfl:    atorvastatin (LIPITOR) 20 MG tablet, TAKE 1 TABLET EVERY NIGHT AT BEDTIME, Disp: 90 tablet, Rfl: 0   ciprofloxacin (CIPRO) 250 MG tablet, Take 250 mg by mouth 2 (two) times daily. , Disp: , Rfl:    clopidogrel (PLAVIX) 75 MG tablet, TAKE 1 TABLET EVERY DAY, Disp: 90 tablet, Rfl: 3   donepezil (ARICEPT) 10 MG tablet, Take 1 tablet (10 mg total) by mouth at bedtime., Disp: 90 tablet, Rfl: 3   famotidine (PEPCID) 20 MG tablet, Take 20 mg by mouth daily. , Disp: , Rfl:    FLUoxetine (PROZAC) 10 MG capsule, Take 1 capsule (10 mg total) by mouth daily. (Patient not taking: Reported on 11/28/2018), Disp: 30 capsule, Rfl: 0   gabapentin (NEURONTIN) 300 MG capsule, Take 1 capsule (300 mg total) by mouth 3 (three) times daily., Disp: 270 capsule, Rfl: 3   HYDROcodone-acetaminophen (NORCO) 7.5-325 MG tablet, Take 1 tablet by mouth every 8 (eight) hours as needed for up to 30 days for severe pain. Must last 30 days., Disp: 90 tablet, Rfl: 0   ipratropium-albuterol (DUONEB) 0.5-2.5 (3) MG/3ML SOLN, INHALE THE CONTENTS OF 1 VIAL VIA NEBULIZER THREE TIMES DAILY AS DIRECTED (Patient taking differently: Inhale 3 mLs into the lungs 3 (three) times daily. ), Disp: 810 mL, Rfl: 2   pantoprazole (PROTONIX) 40 MG tablet, Take 1 tablet (40 mg total) by mouth daily., Disp: 90 tablet, Rfl: 0   QUEtiapine (SEROQUEL) 25 MG tablet, Take 1 tablet (25 mg total) by mouth See admin instructions. Take 1 tablet in a.m. and 1 tablet in the afternoon and 2 tablets(50 mg total) before bedtime around 8 to 9 PM, Disp: 150 tablet, Rfl: 0   QUEtiapine (SEROQUEL) 50 MG tablet, Take 50 mg by mouth at bedtime., Disp: , Rfl:    Thiamine HCl (VITAMIN B-1) 250 MG tablet, Take 250 mg by mouth daily., Disp: , Rfl:    vitamin C (ASCORBIC ACID) 250 MG tablet, Take 250 mg by mouth daily., Disp: , Rfl:   Physical exam: There were no vitals filed for this  visit. Physical Exam Exam conducted with a chaperone present.  Genitourinary:    Labia:        Right: Tenderness present.        Left: Tenderness (diffuse erythema across r & l labia) and lesion present.      Comments: Internal exam not performed Neurological:  Mental Status: She is alert.  Psychiatric:        Mood and Affect: Mood is anxious.        Behavior: Behavior is agitated.        Cognition and Memory: Memory is impaired.      CMP Latest Ref Rng & Units 11/17/2018  Glucose 70 - 99 mg/dL 107(H)  BUN 8 - 23 mg/dL 18  Creatinine 0.44 - 1.00 mg/dL 1.75(H)  Sodium 135 - 145 mmol/L 140  Potassium 3.5 - 5.1 mmol/L 5.0  Chloride 98 - 111 mmol/L 109  CO2 22 - 32 mmol/L 21(L)  Calcium 8.9 - 10.3 mg/dL 8.5(L)  Total Protein 6.5 - 8.1 g/dL 6.6  Total Bilirubin 0.3 - 1.2 mg/dL 0.6  Alkaline Phos 38 - 126 U/L 84  AST 15 - 41 U/L 17  ALT 0 - 44 U/L 12   CBC Latest Ref Rng & Units 11/17/2018  WBC 4.0 - 10.5 K/uL 10.9(H)  Hemoglobin 12.0 - 15.0 g/dL 10.7(L)  Hematocrit 36.0 - 46.0 % 34.2(L)  Platelets 150 - 400 K/uL 262    No images are attached to the encounter.  Dg Chest 2 View  Result Date: 11/20/2018 CLINICAL DATA:  Dyspnea hypoxia.  Left rib pain post fall EXAM: CHEST - 2 VIEW COMPARISON:  11/17/2018 FINDINGS: Cardiac enlargement without heart failure or edema. Atherosclerotic aorta. Progression of small left effusion with mild left lower lobe atelectasis. Chronic compression fracture approximately L1 is unchanged. IMPRESSION: Progression of left pleural effusion and left lower lobe atelectasis. Negative for edema. Electronically Signed   By: Franchot Gallo M.D.   On: 11/20/2018 16:29   Dg Chest 2 View  Result Date: 11/17/2018 CLINICAL DATA:  Left rib and left upper quadrant pain. Cough. Wheezing. EXAM: CHEST - 2 VIEW COMPARISON:  Chest radiograph 07/25/2018 and PET-CT 08/24/2018 FINDINGS: The cardiomediastinal silhouette is unchanged. Aortic atherosclerosis is noted.  There may be a trace residual right pleural effusion. Mild left greater than right basilar opacities suggest atelectasis and scarring. A chronic severe T12 compression fracture is noted. IMPRESSION: Bibasilar scarring and atelectasis with possible trace residual right pleural effusion. Electronically Signed   By: Logan Bores M.D.   On: 11/17/2018 20:06   Ct Head Wo Contrast  Result Date: 11/20/2018 CLINICAL DATA:  For head injury after fall out of wheelchair. Uncertain loss of consciousness. EXAM: CT HEAD WITHOUT CONTRAST CT CERVICAL SPINE WITHOUT CONTRAST TECHNIQUE: Multidetector CT imaging of the head and cervical spine was performed following the standard protocol without intravenous contrast. Multiplanar CT image reconstructions of the cervical spine were also generated. COMPARISON:  None. FINDINGS: CT HEAD FINDINGS Brain: Mild diffuse cortical atrophy is noted. Mild chronic ischemic white matter disease is noted. 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 No mass effect or midline shift is noted. Ventricular size is within normal limits. There is no evidence of mass lesion, hemorrhage or acute infarction. Vascular: No hyperdense vessel or unexpected calcification. Skull: Normal. Negative for fracture or focal lesion. Sinuses/Orbits: No acute finding. Other: None. CT CERVICAL SPINE FINDINGS Alignment: Grade 1 anterolisthesis of C3-4 and C4-5 is noted secondary to posterior facet joint hypertrophy. Skull base and vertebrae: No acute fracture. No primary bone lesion or focal pathologic process. Soft tissues and spinal canal: No prevertebral fluid or swelling. No visible canal hematoma. Disc levels: Severe degenerative disc disease is noted at C5-6 and C6-7 with anterior osteophyte formation. Upper chest: Negative. Other: Degenerative changes are seen involving posterior facet joints bilaterally. IMPRESSION:  Mild diffuse cortical atrophy. Mild chronic ischemic white matter disease. No acute intracranial  abnormality seen. Multilevel degenerative disc disease. No acute abnormality seen in the cervical spine. Electronically Signed   By: Marijo Conception M.D.   On: 11/20/2018 16:10   Ct Cervical Spine Wo Contrast  Result Date: 11/20/2018 CLINICAL DATA:  For head injury after fall out of wheelchair. Uncertain loss of consciousness. EXAM: CT HEAD WITHOUT CONTRAST CT CERVICAL SPINE WITHOUT CONTRAST TECHNIQUE: Multidetector CT imaging of the head and cervical spine was performed following the standard protocol without intravenous contrast. Multiplanar CT image reconstructions of the cervical spine were also generated. COMPARISON:  None. FINDINGS: CT HEAD FINDINGS Brain: Mild diffuse cortical atrophy is noted. Mild chronic ischemic white matter disease is noted. 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 No mass effect or midline shift is noted. Ventricular size is within normal limits. There is no evidence of mass lesion, hemorrhage or acute infarction. Vascular: No hyperdense vessel or unexpected calcification. Skull: Normal. Negative for fracture or focal lesion. Sinuses/Orbits: No acute finding. Other: None. CT CERVICAL SPINE FINDINGS Alignment: Grade 1 anterolisthesis of C3-4 and C4-5 is noted secondary to posterior facet joint hypertrophy. Skull base and vertebrae: No acute fracture. No primary bone lesion or focal pathologic process. Soft tissues and spinal canal: No prevertebral fluid or swelling. No visible canal hematoma. Disc levels: Severe degenerative disc disease is noted at C5-6 and C6-7 with anterior osteophyte formation. Upper chest: Negative. Other: Degenerative changes are seen involving posterior facet joints bilaterally. IMPRESSION: Mild diffuse cortical atrophy. Mild chronic ischemic white matter disease. No acute intracranial abnormality seen. Multilevel degenerative disc disease. No acute abnormality seen in the cervical spine. Electronically Signed   By: Marijo Conception M.D.   On: 11/20/2018  16:10    Assessment and plan- Patient is a 83 y.o. female diagnosed with gastric adenocarcinoma, currently receiving palliative radiation, and history of renal cell carcinoma, who presents to symptom management clinic for labial ulcer and pain.  1.  Labial ulcer-etiology unclear but given her given history and clinical findings today I suspect vulvar candidiasis though thorough exam challenging due to patient's anxiety, agitation, and impaired memory. Topical or intra-vaginal treatments would likely be more agitating to patient and therefore, will empiracally treat with fluconazole 150 mg PO on day 1, repeat on day 3. If ongoing symptoms, consider return to clinic for hsv testing vs biopsy.   No follow-up needed at this time.  Advised to return to clinic if ongoing or unimproved symptoms.  Plan of care discussed with husband and patient's son who were in agreement.  Advised to call clinic for any questions, concerns, complaints.   Visit Diagnosis 1. Genital labial ulcer   2. Vulval candidiasis     Thank you for allowing me to participate in the care of this patient.   Beckey Rutter, DNP, AGNP-C [facility] (work cell) [phone] (office)  CC: Dr. Mike Gip Dr. Sanda Klein

## 2018-12-07 ENCOUNTER — Ambulatory Visit
Admission: RE | Admit: 2018-12-07 | Discharge: 2018-12-07 | Disposition: A | Discharge status: Home / Self Care | Payer: Medicare HMO | Source: Ambulatory Visit | Attending: Radiation Oncology | Admitting: Radiation Oncology

## 2018-12-07 ENCOUNTER — Telehealth: Payer: Self-pay | Admitting: Primary Care

## 2018-12-07 ENCOUNTER — Inpatient Hospital Stay: Payer: Medicare HMO

## 2018-12-07 ENCOUNTER — Other Ambulatory Visit: Payer: Self-pay

## 2018-12-07 ENCOUNTER — Ambulatory Visit: Payer: Self-pay

## 2018-12-07 DIAGNOSIS — C162 Malignant neoplasm of body of stomach: Secondary | ICD-10-CM | POA: Diagnosis not present

## 2018-12-07 DIAGNOSIS — Z9181 History of falling: Secondary | ICD-10-CM

## 2018-12-07 DIAGNOSIS — Z993 Dependence on wheelchair: Secondary | ICD-10-CM

## 2018-12-07 DIAGNOSIS — F02818 Dementia in other diseases classified elsewhere, unspecified severity, with other behavioral disturbance: Secondary | ICD-10-CM

## 2018-12-07 DIAGNOSIS — Z510 Encounter for antineoplastic radiation therapy: Secondary | ICD-10-CM | POA: Diagnosis not present

## 2018-12-07 DIAGNOSIS — F0281 Dementia in other diseases classified elsewhere with behavioral disturbance: Secondary | ICD-10-CM

## 2018-12-07 MED ORDER — FLUCONAZOLE 150 MG PO TABS: 150.0000 mg | ORAL_TABLET | Freq: Every day | ORAL | 0 refills | Status: DC

## 2018-12-07 NOTE — Telephone Encounter (Signed)
Spoke with patient's husband and have scheduled an In-person Palliative f/u visit for 12/14/18 @ 9 AM.

## 2018-12-07 NOTE — Chronic Care Management (AMB) (Signed)
  Chronic Care Management   Note  12/07/2018 Name: Amy [name] MRN: [id number] DOB: Mar 11, 1936  Care Coordination: Incoming call from [place] [place] referals coordinator. Christy updated RN CM that Palliative Care NP would make home visit next week to assess patient needs and husbands caregiver strain.  Plan: RNCM will reschedule patient follow up visit for 1 week until after palliative care home visit    [name] E. Rollene Rotunda, RN, BSN Nurse Care Coordinator [facility] Care Management  [phone]

## 2018-12-08 ENCOUNTER — Telehealth: Payer: Self-pay

## 2018-12-08 ENCOUNTER — Ambulatory Visit
Admission: RE | Admit: 2018-12-08 | Discharge: 2018-12-08 | Disposition: A | Discharge status: Home / Self Care | Payer: Medicare HMO | Source: Ambulatory Visit | Attending: Radiation Oncology | Admitting: Radiation Oncology

## 2018-12-08 ENCOUNTER — Other Ambulatory Visit: Payer: Self-pay

## 2018-12-08 DIAGNOSIS — Z510 Encounter for antineoplastic radiation therapy: Secondary | ICD-10-CM | POA: Diagnosis not present

## 2018-12-08 DIAGNOSIS — C162 Malignant neoplasm of body of stomach: Secondary | ICD-10-CM | POA: Diagnosis not present

## 2018-12-11 ENCOUNTER — Ambulatory Visit
Admission: RE | Admit: 2018-12-11 | Discharge: 2018-12-11 | Disposition: A | Discharge status: Home / Self Care | Payer: Medicare HMO | Source: Ambulatory Visit | Attending: Radiation Oncology | Admitting: Radiation Oncology

## 2018-12-11 ENCOUNTER — Other Ambulatory Visit: Payer: Self-pay

## 2018-12-11 DIAGNOSIS — F0390 Unspecified dementia without behavioral disturbance: Secondary | ICD-10-CM | POA: Diagnosis not present

## 2018-12-11 DIAGNOSIS — N390 Urinary tract infection, site not specified: Secondary | ICD-10-CM | POA: Diagnosis not present

## 2018-12-11 DIAGNOSIS — N183 Chronic kidney disease, stage 3 (moderate): Secondary | ICD-10-CM | POA: Diagnosis not present

## 2018-12-11 DIAGNOSIS — E1122 Type 2 diabetes mellitus with diabetic chronic kidney disease: Secondary | ICD-10-CM | POA: Diagnosis not present

## 2018-12-11 DIAGNOSIS — C162 Malignant neoplasm of body of stomach: Secondary | ICD-10-CM | POA: Diagnosis not present

## 2018-12-11 DIAGNOSIS — Z510 Encounter for antineoplastic radiation therapy: Secondary | ICD-10-CM | POA: Diagnosis not present

## 2018-12-11 DIAGNOSIS — J441 Chronic obstructive pulmonary disease with (acute) exacerbation: Secondary | ICD-10-CM | POA: Diagnosis not present

## 2018-12-11 DIAGNOSIS — Z7951 Long term (current) use of inhaled steroids: Secondary | ICD-10-CM | POA: Diagnosis not present

## 2018-12-11 DIAGNOSIS — Z89612 Acquired absence of left leg above knee: Secondary | ICD-10-CM | POA: Diagnosis not present

## 2018-12-12 ENCOUNTER — Ambulatory Visit
Admission: RE | Admit: 2018-12-12 | Discharge: 2018-12-12 | Disposition: A | Discharge status: Home / Self Care | Payer: Medicare HMO | Source: Ambulatory Visit | Attending: Radiation Oncology | Admitting: Radiation Oncology

## 2018-12-12 ENCOUNTER — Other Ambulatory Visit: Payer: Self-pay

## 2018-12-12 DIAGNOSIS — Z510 Encounter for antineoplastic radiation therapy: Secondary | ICD-10-CM | POA: Diagnosis not present

## 2018-12-12 DIAGNOSIS — C162 Malignant neoplasm of body of stomach: Secondary | ICD-10-CM | POA: Diagnosis not present

## 2018-12-13 ENCOUNTER — Ambulatory Visit
Admission: RE | Admit: 2018-12-13 | Discharge: 2018-12-13 | Disposition: A | Discharge status: Home / Self Care | Payer: Medicare HMO | Source: Ambulatory Visit | Attending: Radiation Oncology | Admitting: Radiation Oncology

## 2018-12-13 ENCOUNTER — Other Ambulatory Visit: Payer: Self-pay

## 2018-12-13 DIAGNOSIS — C162 Malignant neoplasm of body of stomach: Secondary | ICD-10-CM | POA: Diagnosis not present

## 2018-12-13 DIAGNOSIS — Z510 Encounter for antineoplastic radiation therapy: Secondary | ICD-10-CM | POA: Diagnosis not present

## 2018-12-14 ENCOUNTER — Other Ambulatory Visit: Payer: Medicare HMO | Admitting: Primary Care

## 2018-12-14 ENCOUNTER — Inpatient Hospital Stay: Payer: Medicare HMO

## 2018-12-14 ENCOUNTER — Other Ambulatory Visit: Payer: Self-pay

## 2018-12-14 ENCOUNTER — Ambulatory Visit
Admission: RE | Admit: 2018-12-14 | Discharge: 2018-12-14 | Disposition: A | Discharge status: Home / Self Care | Payer: Medicare HMO | Source: Ambulatory Visit | Attending: Radiation Oncology | Admitting: Radiation Oncology

## 2018-12-14 DIAGNOSIS — C162 Malignant neoplasm of body of stomach: Secondary | ICD-10-CM

## 2018-12-14 DIAGNOSIS — Z515 Encounter for palliative care: Secondary | ICD-10-CM | POA: Diagnosis not present

## 2018-12-14 DIAGNOSIS — Z510 Encounter for antineoplastic radiation therapy: Secondary | ICD-10-CM | POA: Diagnosis not present
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
## 2018-12-15 ENCOUNTER — Ambulatory Visit: Payer: Medicare HMO

## 2018-12-15 ENCOUNTER — Ambulatory Visit: Payer: Self-pay

## 2018-12-15 DIAGNOSIS — F0281 Dementia in other diseases classified elsewhere with behavioral disturbance: Secondary | ICD-10-CM

## 2018-12-15 DIAGNOSIS — G301 Alzheimer's disease with late onset: Secondary | ICD-10-CM

## 2018-12-15 DIAGNOSIS — Z9181 History of falling: Secondary | ICD-10-CM

## 2018-12-15 NOTE — Chronic Care Management (AMB) (Signed)
  Chronic Care Management   Follow Up Note   12/15/2018 Name: Amy [name] MRN: [id number] DOB: 09/21/1935  Referred by: [name], FNP Reason for referral : Chronic Care Management (follow up)   Subjective: per husband "That palliative care nurse is going to have the social worker call me"   Objective:  Assessment: Assessment: Amy [name] is an 83 year old female patient of [facility] who was referred to chronic case management by her health plan. She has a history of but not limited to renal cell carcinoma, gastric carcinoma currently receiving palliative care, AKA (wheelchair/bed bound), COPD, and late onset Alzheimers. RN CM attempted initial health assessment but soon identified severe caregiver strain from husband. Contacted palliative care during last encounter to facilitate re-engagement with patient and family. Today I followed up with husband to discuss yesterdays visit with palliative care NP.  Review of patient status, including review of consultants reports, relevant laboratory and other test results, and collaboration with appropriate care team members and the patient's provider was performed as part of comprehensive patient evaluation and provision of chronic care management services.    Goals Addressed            This Visit's Progress   . per husband "I just don't know how much longer I can care for her" (pt-stated)       Mr. Klassen states today has been pretty challenging caring for his wife. He got her up, attempted to get her dressed, medicated, and fed in order to get her to her palliative chemo appointment. States she refused to take her medications and refused to go to her cancer center appointment. Mr. Kincannon states he continues to struggle with caring for her and is down to his last caregiver (the others have quit because of Ms. Dancy dementia/beharior (yelling out). Currently he is struggling with  transferring her secondary to his bad knee and arthritic hands. He expressed his difficulties to palliative care NP during yesterdays home visit. States she is referred to palliative care SW.   Current Barriers:  . Lacks caregiver support.  . Dementia . AKA needing total care for toileting, bathing, transfer  Nurse Case Manager Clinical Goal(s):  [name] Over the next 14 days, patients husband will engage with palliative care SW to discuss custodial care options  Interventions:  . Active listening . Provided emotional support . Assessed for caregiver strain and additional family support . Discussed for husband in home palliative care appointment yesterday/reviewed note from NP  Patient Self Care Activities:  . Patient/caregiver will engage with palliative care   Please see past updates related to this goal by clicking on the "Past Updates" button in the selected goal           Telephone follow up appointment with care management team member scheduled for: 1 week to assess for engagement with palliative care SW     Rayli Wiederhold E. Rollene Rotunda, RN, BSN Nurse Care Coordinator [facility] Care Management  [phone]

## 2018-12-17 NOTE — Patient Instructions (Signed)
  Thank you allowing the Chronic Care Management Team to be a part of your care! It was a pleasure speaking with you today!  1. Please continue to engage with palliative care and oncology teams  CCM (Chronic Care Management) Team   Trish Fountain RN, BSN Nurse Care Coordinator  [phone]  Ruben Reason PharmD  Clinical Pharmacist  [phone]   Elliot Gurney, LCSW Clinical Social Worker [phone]  Goals Addressed            This Visit's Progress   . per husband "I just don't know how much longer I can care for her" (pt-stated)       Current Barriers:  . Lacks caregiver support.  . Dementia . AKA needing total care for toileting, bathing, transfer  Nurse Case Manager Clinical Goal(s):  [name] Over the next 14 days, patients husband will engage with palliative care SW to discuss custodial care options  Interventions:  . Active listening . Provided emotional support . Assessed for caregiver strain and additional family support . Discussed for husband in home palliative care appointment yesterday/reviewed note from NP  Patient Self Care Activities:  . Patient/caregiver will engage with palliative care   Please see past updates related to this goal by clicking on the "Past Updates" button in the selected goal          The patient verbalized understanding of instructions provided today and declined a print copy of patient instruction materials.   Telephone follow up appointment with care management team member scheduled for: 1 week  SYMPTOMS OF A STROKE   You have any symptoms of stroke. "BE FAST" is an easy way to remember the main warning signs: ? B - Balance. Signs are dizziness, sudden trouble walking, or loss of balance. ? E - Eyes. Signs are trouble seeing or a sudden change in how you see. ? F - Face. Signs are sudden weakness or loss of feeling of the face, or the face or eyelid drooping on one side. ? A - Arms. Signs are weakness or loss of feeling  in an arm. This happens suddenly and usually on one side of the body. ? S - Speech. Signs are sudden trouble speaking, slurred speech, or trouble understanding what people say. ? T - Time. Time to call emergency services. Write down what time symptoms started.  You have other signs of stroke, such as: ? A sudden, very bad headache with no known cause. ? Feeling sick to your stomach (nausea). ? Throwing up (vomiting). ? Jerky movements you cannot control (seizure).  SYMPTOMS OF A HEART ATTACK  What are the signs or symptoms? Symptoms of this condition include:  Chest pain. It may feel like: ? Crushing or squeezing. ? Tightness, pressure, fullness, or heaviness.  Pain in the arm, neck, jaw, back, or upper body.  Shortness of breath.  Heartburn.  Indigestion.  Nausea.  Cold sweats.  Feeling tired.  Sudden lightheadedness.

## 2018-12-18 ENCOUNTER — Encounter: Payer: Self-pay | Admitting: Student in an Organized Health Care Education/Training Program

## 2018-12-18 ENCOUNTER — Other Ambulatory Visit: Payer: Self-pay

## 2018-12-18 ENCOUNTER — Ambulatory Visit
Admission: RE | Admit: 2018-12-18 | Discharge: 2018-12-18 | Disposition: A | Discharge status: Home / Self Care | Payer: Medicare HMO | Source: Ambulatory Visit | Attending: Radiation Oncology | Admitting: Radiation Oncology

## 2018-12-18 DIAGNOSIS — Z510 Encounter for antineoplastic radiation therapy: Secondary | ICD-10-CM | POA: Diagnosis not present

## 2018-12-18 DIAGNOSIS — C162 Malignant neoplasm of body of stomach: Secondary | ICD-10-CM | POA: Diagnosis not present

## 2018-12-19 ENCOUNTER — Ambulatory Visit
Admission: RE | Admit: 2018-12-19 | Discharge: 2018-12-19 | Disposition: A | Discharge status: Home / Self Care | Payer: Medicare HMO | Source: Ambulatory Visit | Attending: Radiation Oncology | Admitting: Radiation Oncology

## 2018-12-19 ENCOUNTER — Ambulatory Visit
Payer: Medicare HMO | Attending: Student in an Organized Health Care Education/Training Program | Admitting: Student in an Organized Health Care Education/Training Program

## 2018-12-19 ENCOUNTER — Other Ambulatory Visit: Payer: Self-pay

## 2018-12-19 ENCOUNTER — Encounter: Payer: Self-pay | Admitting: Student in an Organized Health Care Education/Training Program

## 2018-12-19 DIAGNOSIS — N184 Chronic kidney disease, stage 4 (severe): Secondary | ICD-10-CM

## 2018-12-19 DIAGNOSIS — Z905 Acquired absence of kidney: Secondary | ICD-10-CM | POA: Diagnosis not present

## 2018-12-19 DIAGNOSIS — F1120 Opioid dependence, uncomplicated: Secondary | ICD-10-CM | POA: Diagnosis not present

## 2018-12-19 DIAGNOSIS — C162 Malignant neoplasm of body of stomach: Secondary | ICD-10-CM | POA: Diagnosis not present

## 2018-12-19 DIAGNOSIS — G894 Chronic pain syndrome: Secondary | ICD-10-CM

## 2018-12-19 DIAGNOSIS — Z89612 Acquired absence of left leg above knee: Secondary | ICD-10-CM

## 2018-12-19 DIAGNOSIS — G546 Phantom limb syndrome with pain: Secondary | ICD-10-CM

## 2018-12-19 DIAGNOSIS — Z510 Encounter for antineoplastic radiation therapy: Secondary | ICD-10-CM | POA: Diagnosis not present

## 2018-12-19 MED ORDER — GABAPENTIN 300 MG PO CAPS: 300.0000 mg | ORAL_CAPSULE | Freq: Three times a day (TID) | ORAL | 3 refills | Status: DC

## 2018-12-19 MED ORDER — HYDROCODONE-ACETAMINOPHEN 7.5-325 MG PO TABS: 1.0000 | ORAL_TABLET | Freq: Three times a day (TID) | ORAL | 0 refills | Status: AC | PRN

## 2018-12-19 MED ORDER — HYDROCODONE-ACETAMINOPHEN 7.5-325 MG PO TABS: 1.0000 | ORAL_TABLET | Freq: Three times a day (TID) | ORAL | 0 refills | Status: DC | PRN

## 2018-12-19 NOTE — Progress Notes (Signed)
Pain Management Virtual Encounter Note - Virtual Visit via Telephone Telehealth (real-time audio visits between healthcare provider and patient).   Patient's Phone No. & Preferred Pharmacy:  [phone] (home); [phone] (mobile); (Preferred) [phone] [email or web address]  [facility] - [place], [address] [place] [address] Phone: [phone] Fax: [phone]  [facility] - [place], [address] [place] [address] Phone: [phone] Fax: [phone]    Pre-screening note:  Our staff contacted Amy [name] and offered her an "in person", "face-to-face" appointment versus a telephone encounter. She indicated preferring the telephone encounter, at this time.   Reason for Virtual Visit: COVID-19*  Social distancing based on CDC and AMA recommendations.   I contacted Amy [name] on 12/19/2018 via telephone.      I clearly identified myself as Gillis Santa, MD. I verified that I was speaking with the correct person using two identifiers (Name: Amy [name], and date of birth: August 09, 1935).  Advanced Informed Consent I sought verbal advanced consent from Amy [name] for virtual visit interactions. I informed Amy [name] of possible security and privacy concerns, risks, and limitations associated with providing "not-in-person" medical evaluation and management services. I also informed Amy [name] of the availability of "in-person" appointments. Finally, I informed her that there would be a charge for the virtual visit and that she could be  personally, fully or partially, financially responsible for it. Amy [name] expressed understanding and agreed to proceed.   Historic Elements   Amy [name] is a 83 y.o. year old, female patient evaluated today after her last encounter by our practice on 12/04/2018. Amy [name]  has a past medical history of  Anemia, Anxiety, Arthritis, Bilateral cataracts, Bronchitis, Cataract, Chronic kidney disease, Depression, Diabetes mellitus without complication ([place]), DVT (deep venous thrombosis) ([place]), GERD (gastroesophageal reflux disease), Headache, History of frequent urinary tract infections, renal cell cancer, Hyperlipidemia, Hypertension, Numbness and tingling, Obesity, Osteopenia (10/05/2017), Peripheral artery disease ([place]), Urinary frequency, and Urinary incontinence. She also  has a past surgical history that includes Leg amputation; Abdominal hysterectomy (1978); Cholecystectomy; Carpal tunnel release (Bilateral); stent placement right leg ; Robot assisted laparoscopic nephrectomy (Left, 01/15/2015); Colonoscopy; Colonoscopy with esophagogastroduodenoscopy (egd); ERCP (N/A, 08/19/2017); and Esophagogastroduodenoscopy (egd) with propofol (N/A, 10/12/2018). Amy [name] has a current medication list which includes the following prescription(s): acetaminophen, albuterol, vitamin c, atorvastatin, clopidogrel, donepezil, famotidine, fluoxetine, gabapentin, hydrocodone-acetaminophen, hydrocodone-acetaminophen, ipratropium-albuterol, pantoprazole, quetiapine, quetiapine, vitamin b-1, vitamin c, ciprofloxacin, and fluconazole. She  reports that she quit smoking about 14 years ago. Her smoking use included cigarettes. She has a 76.50 pack-year smoking history. She has never used smokeless tobacco. She reports previous alcohol use. She reports that she does not use drugs. Amy [name] is allergic to fentanyl and penicillins.   HPI  Today, she is being contacted for medication management.   Patient has been going to radiation therapy. Her husband, her caregiver is having difficulty taking care of her.  Patient is refusing to take her medications or go to her cancer center appointment.  Patient had sustained a fall but x-ray was negative for rib fractures.  I did have discussion with the patient as well as her husband and  informed them that palliative care could potentially manage opioid medications if need be but I am happy to continue managing them.   Pharmacotherapy Assessment  Analgesic:   12/04/2018  1   10/18/2018  Hydrocodone-Acetamin 7.5-325  90.00 30  Bi Lat   [id number]   Thr (4878)   0  22.50 MME  Medicare   [place]    Monitoring: Pharmacotherapy: No side-effects or adverse reactions reported. [place] PMP: PDMP reviewed during this encounter.       Compliance: No problems identified. Effectiveness: Clinically acceptable. Plan: Refer to "POC".  Pertinent Labs   SAFETY SCREENING Profile Lab Results  Component Value Date   SARSCOV2NAA NOT DETECTED 10/09/2018   COVIDSOURCE NASOPHARYNGEAL 10/09/2018   MRSAPCR NEGATIVE 07/19/2018   Renal Function Lab Results  Component Value Date   BUN 18 11/17/2018   CREATININE 1.75 (H) 11/17/2018   BCR 9 07/12/2018   GFRAA 31 (L) 11/17/2018   GFRNONAA 27 (L) 11/17/2018   Hepatic Function Lab Results  Component Value Date   AST 17 11/17/2018   ALT 12 11/17/2018   ALBUMIN 3.1 (L) 11/17/2018   UDS No results found for: SUMMARY Note: Above Lab results reviewed.  Recent imaging  DG Chest 2 View CLINICAL DATA:  Dyspnea hypoxia.  Left rib pain post fall  EXAM: CHEST - 2 VIEW  COMPARISON:  11/17/2018  FINDINGS: Cardiac enlargement without heart failure or edema. Atherosclerotic aorta. Progression of small left effusion with mild left lower lobe atelectasis. Chronic compression fracture approximately L1 is unchanged.  IMPRESSION: Progression of left pleural effusion and left lower lobe atelectasis. Negative for edema.  Electronically Signed   By: Franchot Gallo M.D.   On: 11/20/2018 16:29 CT Cervical Spine Wo Contrast CLINICAL DATA:  For head injury after fall out of wheelchair. Uncertain loss of consciousness.  EXAM: CT HEAD WITHOUT CONTRAST  CT CERVICAL SPINE WITHOUT CONTRAST  TECHNIQUE: Multidetector CT imaging of the head and cervical  spine was performed following the standard protocol without intravenous contrast. Multiplanar CT image reconstructions of the cervical spine were also generated.  COMPARISON:  None.  FINDINGS: CT HEAD FINDINGS  Brain: Mild diffuse cortical atrophy is noted. Mild chronic ischemic white matter disease is noted. 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 No mass effect or midline shift is noted. Ventricular size is within normal limits. There is no evidence of mass lesion, hemorrhage or acute infarction.  Vascular: No hyperdense vessel or unexpected calcification.  Skull: Normal. Negative for fracture or focal lesion.  Sinuses/Orbits: No acute finding.  Other: None.  CT CERVICAL SPINE FINDINGS  Alignment: Grade 1 anterolisthesis of C3-4 and C4-5 is noted secondary to posterior facet joint hypertrophy.  Skull base and vertebrae: No acute fracture. No primary bone lesion or focal pathologic process.  Soft tissues and spinal canal: No prevertebral fluid or swelling. No visible canal hematoma.  Disc levels: Severe degenerative disc disease is noted at C5-6 and C6-7 with anterior osteophyte formation.  Upper chest: Negative.  Other: Degenerative changes are seen involving posterior facet joints bilaterally.  IMPRESSION: Mild diffuse cortical atrophy. Mild chronic ischemic white matter disease. No acute intracranial abnormality seen.  Multilevel degenerative disc disease. No acute abnormality seen in the cervical spine.  Electronically Signed   By: Marijo Conception M.D.   On: 11/20/2018 16:10 CT Head Wo Contrast CLINICAL DATA:  For head injury after fall out of wheelchair. Uncertain loss of consciousness.  EXAM: CT HEAD WITHOUT CONTRAST  CT CERVICAL SPINE WITHOUT CONTRAST  TECHNIQUE: Multidetector CT imaging of the head and cervical spine was performed following the standard protocol without intravenous contrast. Multiplanar CT image reconstructions of the  cervical spine were also generated.  COMPARISON:  None.  FINDINGS: CT HEAD FINDINGS  Brain: Mild diffuse cortical atrophy is noted. Mild chronic ischemic white matter disease is noted. 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 No mass effect or midline shift is noted. Ventricular size is within normal limits. There is no evidence of mass lesion, hemorrhage or acute infarction.  Vascular: No hyperdense vessel or unexpected calcification.  Skull: Normal. Negative for fracture or focal lesion.  Sinuses/Orbits: No acute finding.  Other: None.  CT CERVICAL SPINE FINDINGS  Alignment: Grade 1 anterolisthesis of C3-4 and C4-5 is noted secondary to posterior facet joint hypertrophy.  Skull base and vertebrae: No acute fracture. No primary bone lesion or focal pathologic process.  Soft tissues and spinal canal: No prevertebral fluid or swelling. No visible canal hematoma.  Disc levels: Severe degenerative disc disease is noted at C5-6 and C6-7 with anterior osteophyte formation.  Upper chest: Negative.  Other: Degenerative changes are seen involving posterior facet joints bilaterally.  IMPRESSION: Mild diffuse cortical atrophy. Mild chronic ischemic white matter disease. No acute intracranial abnormality seen.  Multilevel degenerative disc disease. No acute abnormality seen in the cervical spine.  Electronically Signed   By: Marijo Conception M.D.   On: 11/20/2018 16:10  Assessment  The primary encounter diagnosis was Chronic pain syndrome. 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  Plan of Care  I have changed Amy [name]'s HYDROcodone-acetaminophen. I am also having her start on  HYDROcodone-acetaminophen. Additionally, I am having her maintain her donepezil, ipratropium-albuterol, FLUoxetine, QUEtiapine, vitamin C, clopidogrel, atorvastatin, albuterol, famotidine, acetaminophen, ciprofloxacin, pantoprazole, QUEtiapine, vitamin B-1, vitamin C, fluconazole, and gabapentin.  Pharmacotherapy (Medications Ordered): Meds ordered this encounter  Medications  . gabapentin (NEURONTIN) 300 MG capsule    Sig: Take 1 capsule (300 mg total) by mouth 3 (three) times daily.    Dispense:  270 capsule    Refill:  3  . HYDROcodone-acetaminophen (NORCO) 7.5-325 MG tablet    Sig: Take 1 tablet by mouth every 8 (eight) hours as needed for severe pain. Must last 30 days.    Dispense:  90 tablet    Refill:  0    [place] STOP ACT - Not applicable. Fill one day early if pharmacy is closed on scheduled refill date.  [name] HYDROcodone-acetaminophen (NORCO) 7.5-325 MG tablet    Sig: Take 1 tablet by mouth every 8 (eight) hours as needed for severe pain. Must last 30 days.    Dispense:  90 tablet    Refill:  0     STOP ACT - Not applicable. Fill one day early if pharmacy is closed on scheduled refill date.   Orders:  No orders of the defined types were placed in this encounter.  Follow-up plan:   Return in about 9 weeks (around 02/20/2019) for Medication Management (get UDS).     Recent Visits Date Type Provider Dept  10/18/18 Office Visit Gillis Santa, MD [facility]  Showing recent visits within past 90 days and meeting all other requirements   Today's Visits Date Type Provider Dept  12/19/18 Office Visit Gillis Santa, MD [facility]  Showing today's visits and meeting all other requirements   Future Appointments No visits were found meeting these conditions.  Showing future appointments within next 90 days and meeting all other requirements  I discussed the assessment and treatment plan with the patient. The patient was provided an opportunity to ask questions  and all were answered. The patient agreed with the plan and demonstrated an understanding of the instructions.  Patient advised to call back or seek an in-person evaluation if the symptoms or condition worsens.  Total duration of non-face-to-face encounter: 25 minutes.  Note by: Gillis Santa, MD Date: 12/19/2018; Time: 11:07 AM  Note: This dictation was prepared with Dragon dictation. Any transcriptional errors that may result from this process are unintentional.  Disclaimer:  * Given the special circumstances of the COVID-19 pandemic, the federal government has announced that the Office for Civil Rights (OCR) will exercise its enforcement discretion and will not impose penalties on physicians using telehealth in the event of noncompliance with regulatory requirements under the [place] (HIPAA) in connection with the good faith provision of telehealth during the GBMBO-48 national public health emergency. ([place])

## 2018-12-20 ENCOUNTER — Ambulatory Visit
Admission: RE | Admit: 2018-12-20 | Discharge: 2018-12-20 | Disposition: A | Discharge status: Home / Self Care | Payer: Medicare HMO | Source: Ambulatory Visit | Attending: Radiation Oncology | Admitting: Radiation Oncology

## 2018-12-20 ENCOUNTER — Telehealth: Payer: Self-pay | Admitting: *Deleted

## 2018-12-20 ENCOUNTER — Other Ambulatory Visit: Payer: Self-pay

## 2018-12-20 ENCOUNTER — Encounter: Payer: Medicare HMO | Admitting: Student in an Organized Health Care Education/Training Program

## 2018-12-20 DIAGNOSIS — C162 Malignant neoplasm of body of stomach: Secondary | ICD-10-CM | POA: Diagnosis not present

## 2018-12-20 DIAGNOSIS — Z510 Encounter for antineoplastic radiation therapy: Secondary | ICD-10-CM | POA: Diagnosis not present

## 2018-12-20 NOTE — Telephone Encounter (Signed)
I'd prefer for her to be seen by gyn or pcp for pelvic exam since I don't think this is secondary to her malignancy or her radiation treatments. I treated empirically for yeast but if symptoms worsening she would need to be evaluated further. Could you please call the husband let me know if any problems?

## 2018-12-20 NOTE — Telephone Encounter (Signed)
Call retirned to Mr Gipe and patient does not have a GYN currently as she used to see Dr Davis Gourd years ago. He will call her PCP Raelyn Ensign, NP for evaluation

## 2018-12-20 NOTE — Telephone Encounter (Signed)
Patient husband called reporting that the ulcer was getting better, but now it is worse and thinks she should be seen or sent to the wound clinic. He requests to speak with Ander Purpura

## 2018-12-21 ENCOUNTER — Ambulatory Visit
Admission: RE | Admit: 2018-12-21 | Discharge: 2018-12-21 | Disposition: A | Discharge status: Home / Self Care | Payer: Medicare HMO | Source: Ambulatory Visit | Attending: Radiation Oncology | Admitting: Radiation Oncology

## 2018-12-21 ENCOUNTER — Inpatient Hospital Stay: Payer: Medicare HMO

## 2018-12-21 ENCOUNTER — Other Ambulatory Visit: Payer: Self-pay

## 2018-12-21 DIAGNOSIS — F0390 Unspecified dementia without behavioral disturbance: Secondary | ICD-10-CM | POA: Diagnosis not present

## 2018-12-21 DIAGNOSIS — F419 Anxiety disorder, unspecified: Secondary | ICD-10-CM | POA: Diagnosis not present

## 2018-12-21 DIAGNOSIS — Z510 Encounter for antineoplastic radiation therapy: Secondary | ICD-10-CM | POA: Diagnosis not present

## 2018-12-21 DIAGNOSIS — R32 Unspecified urinary incontinence: Secondary | ICD-10-CM | POA: Diagnosis not present

## 2018-12-21 DIAGNOSIS — Z85520 Personal history of malignant carcinoid tumor of kidney: Secondary | ICD-10-CM | POA: Diagnosis not present

## 2018-12-21 DIAGNOSIS — R918 Other nonspecific abnormal finding of lung field: Secondary | ICD-10-CM

## 2018-12-21 DIAGNOSIS — R948 Abnormal results of function studies of other organs and systems: Secondary | ICD-10-CM

## 2018-12-21 DIAGNOSIS — C169 Malignant neoplasm of stomach, unspecified: Secondary | ICD-10-CM | POA: Diagnosis not present

## 2018-12-21 DIAGNOSIS — N766 Ulceration of vulva: Secondary | ICD-10-CM | POA: Diagnosis not present

## 2018-12-21 DIAGNOSIS — R451 Restlessness and agitation: Secondary | ICD-10-CM | POA: Diagnosis not present

## 2018-12-21 DIAGNOSIS — C162 Malignant neoplasm of body of stomach: Secondary | ICD-10-CM | POA: Diagnosis not present

## 2018-12-21 DIAGNOSIS — B373 Candidiasis of vulva and vagina: Secondary | ICD-10-CM | POA: Diagnosis not present

## 2018-12-21 DIAGNOSIS — D509 Iron deficiency anemia, unspecified: Secondary | ICD-10-CM

## 2018-12-21 DIAGNOSIS — R159 Full incontinence of feces: Secondary | ICD-10-CM | POA: Diagnosis not present

## 2018-12-21 DIAGNOSIS — D49519 Neoplasm of unspecified behavior of unspecified kidney: Secondary | ICD-10-CM

## 2018-12-22 ENCOUNTER — Ambulatory Visit
Admission: RE | Admit: 2018-12-22 | Discharge: 2018-12-22 | Disposition: A | Discharge status: Home / Self Care | Payer: Medicare HMO | Source: Ambulatory Visit | Attending: Radiation Oncology | Admitting: Radiation Oncology

## 2018-12-22 ENCOUNTER — Other Ambulatory Visit: Payer: Self-pay

## 2018-12-22 ENCOUNTER — Ambulatory Visit: Payer: Self-pay

## 2018-12-22 ENCOUNTER — Ambulatory Visit (INDEPENDENT_AMBULATORY_CARE_PROVIDER_SITE_OTHER): Payer: Medicare HMO | Admitting: Nurse Practitioner

## 2018-12-22 ENCOUNTER — Encounter: Payer: Self-pay | Admitting: Nurse Practitioner

## 2018-12-22 VITALS — BP 118/70 | HR 62 | Temp 97.5°F | Resp 14

## 2018-12-22 DIAGNOSIS — N898 Other specified noninflammatory disorders of vagina: Secondary | ICD-10-CM

## 2018-12-22 DIAGNOSIS — Z86010 Personal history of colonic polyps: Secondary | ICD-10-CM

## 2018-12-22 DIAGNOSIS — Z89612 Acquired absence of left leg above knee: Secondary | ICD-10-CM

## 2018-12-22 DIAGNOSIS — Z510 Encounter for antineoplastic radiation therapy: Secondary | ICD-10-CM | POA: Diagnosis not present

## 2018-12-22 DIAGNOSIS — C162 Malignant neoplasm of body of stomach: Secondary | ICD-10-CM | POA: Diagnosis not present

## 2018-12-22 DIAGNOSIS — F0281 Dementia in other diseases classified elsewhere with behavioral disturbance: Secondary | ICD-10-CM

## 2018-12-22 DIAGNOSIS — G301 Alzheimer's disease with late onset: Secondary | ICD-10-CM

## 2018-12-22 DIAGNOSIS — Z993 Dependence on wheelchair: Secondary | ICD-10-CM

## 2018-12-22 MED ORDER — DOUBLE ANTIBIOTIC 500-10000 UNIT/GM EX OINT: 1.0000 "application " | TOPICAL_OINTMENT | Freq: Two times a day (BID) | CUTANEOUS | 2 refills | Status: DC

## 2018-12-22 NOTE — Patient Instructions (Addendum)
-  use polysporin and destin cream twice a day for wound at vagina - If area is not improving within 2 weeks please let us know and we will send a referral to wound care.   Wound care  Keep area clean and dry  Follow instructions from your health care provider about how to take care of your wound. Make sure you: ? Wash your hands with soap and water before and after you change your bandage (dressing). If soap and water are not available, use hand sanitizer. ? Change your dressing as told by your health care provider.  Check your wound every day for signs of infection. Have a caregiver do this for you if you are not able. Check for: ? Redness, swelling, or increased pain. ? More fluid or blood. ? Warmth. ? Pus or a bad smell. Skin care  Keep your skin clean and dry. Gently pat your skin dry.  Do not rub or massage your skin.  You or a caregiver should check your skin every day for any changes in color or any new blisters or sores (ulcers). Medicines  Take over-the-counter and prescription medicines only as told by your health care provider.  If you were prescribed an antibiotic medicine, take or apply it as told by your health care provider. Do not stop using the antibiotic even if your condition improves. Reducing and redistributing pressure  Do not lie or sit in one position for a long time. Move or change position every 1-2 hours, or as told by your health care provider.  Use pillows or cushions to reduce pressure. Ask your health care provider to recommend cushions or pads for you. General instructions   Eat a healthy diet that includes lots of protein.  Drink enough fluid to keep your urine pale yellow.  Be as active as you can every day. Ask your health care provider to suggest safe exercises or activities.  Do not abuse drugs or alcohol.  Do not use any products that contain nicotine or tobacco, such as cigarettes, e-cigarettes, and chewing tobacco. If you need help  quitting, ask your health care provider.  Keep all follow-up visits as told by your health care provider. This is important. Contact a health care provider if:  You have: ? A fever or chills. ? Pain that is not helped by medicine. ? Any changes in skin color. ? New blisters or sores. ? Pus or a bad smell coming from your wound. ? Redness, swelling, or pain around your wound. ? More fluid or blood coming from your wound.  Your wound does not improve after 1-2 weeks of treatment. Summary  A pressure injury is damage to the skin and underlying tissue that results from pressure being applied to an area of the body.  Do not lie or sit in one position for a long time. Your health care provider may advise you to move or change position every 1-2 hours.  Follow instructions from your health care provider about how to take care of your wound.  Keep all follow-up visits as told by your health care provider. This is important. This information is not intended to replace advice given to you by your health care provider. Make sure you discuss any questions you have with your health care provider. Document Released: 05/10/2005 Document Revised: 12/07/2017 Document Reviewed: 12/07/2017 Elsevier Patient Education  [place].

## 2018-12-22 NOTE — Progress Notes (Signed)
Name: Amy [name]   MRN: [id number]    DOB: [birth year]   Date:12/22/2018       Progress Note  Subjective  Chief Complaint  Chief Complaint  Patient presents with  . Skin Problem    HPI  Patient presents with husband for vaginal wound They state she has been sensitive in vaginal area over the past month or so. States had provider at cancer center that gave her diflucan for yeast infection- husband feels she wasn't as sore for a few days but patient states it did not provide any relief. Has open sore at the lip of her vagina per husband, leaves spots on her diaper- has not changed in size. Pain is the same, worse with touching.  Denies redness purulent drainage fevers, she is tender in this area.  PHQ2/9: Depression screen [facility] 2/9 12/22/2018 11/28/2018 09/21/2018 07/12/2018 07/11/2018  Decreased Interest 0 0 0 0 1  Down, Depressed, Hopeless 1 0 1 1 1   PHQ - 2 Score 1 0 1 1 2   Altered sleeping 0 0 0 0 1  Tired, decreased energy 0 0 1 1 0  Change in appetite 0 0 0 0 0  Feeling bad or failure about yourself  0 0 0 0 0  Trouble concentrating 0 0 0 0 1  Moving slowly or fidgety/restless 0 0 0 0 0  Suicidal thoughts 0 0 0 0 0  PHQ-9 Score 1 0 2 2 4   Difficult doing work/chores Not difficult at all Not difficult at all Not difficult at all Not difficult at all Somewhat difficult  Some recent data might be hidden     PHQ reviewed. Negative  Patient Active Problem List   Diagnosis Date Noted  . Genital labial ulcer 12/06/2018  . B12 deficiency 11/05/2018  . Folate deficiency 11/05/2018  . Gastric cancer ([place])   . Stomach irritation   . Abnormal finding on GI tract imaging   . Obesity (BMI 30.0-34.9) 09/11/2018  . Abnormal gastrointestinal PET scan 08/27/2018  . Pulmonary nodules 08/27/2018  . Goals of care, counseling/discussion   . Palliative care by specialist   . Hypoxia 07/26/2018  . HCAP (healthcare-associated pneumonia) 07/02/2018  . Hypoxemia   . COPD exacerbation  ([place])   . Renal cell carcinoma ([place])   . Lung nodule 04/23/2018  . Hypertensive renal disease 11/08/2017  . Secondary hyperparathyroidism of renal origin ([place]) 11/08/2017  . QT prolongation 10/27/2017  . Osteopenia 10/05/2017  . Choledocholithiasis 08/13/2017  . Chronic pain syndrome 04/05/2017  . Depression 11/15/2016  . Iron deficiency anemia 09/08/2016  . Anemia 06/29/2016  . Late onset Alzheimer's disease with behavioral disturbance ([place]) 06/14/2016  . S/P AKA (above knee amputation) unilateral, left ([place]) 06/08/2016  . Wheelchair dependent 03/11/2016  . 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 ([place]) 03/13/2015  . S/p nephrectomy 01/29/2015  . Renal neoplasm 01/15/2015  . Abnormal EKG 01/02/2015  . Renal mass 12/05/2014  . GERD (gastroesophageal reflux disease) 11/20/2014  . GAD (generalized anxiety disorder) 11/20/2014  . AB (asthmatic bronchitis) 11/20/2014  . Aortic atherosclerosis ([place]) 11/20/2014  . Cataract 11/20/2014  . Leg pain 11/20/2014  . Continuous opioid dependence ([place]) 11/20/2014  . CKD (chronic kidney disease) stage 4, GFR 15-29 ml/min (HCC) 11/20/2014  . COPD (chronic obstructive pulmonary disease) ([place]) 11/20/2014  . Excessive falling 11/20/2014  . Fatty infiltration of liver 11/20/2014  . Hyperlipidemia 11/20/2014  . Anterior knee pain 11/20/2014  . LBP (low back pain) 11/20/2014  . Malaise and  fatigue 11/20/2014  . Abnormal presence of protein in urine 11/20/2014  . PVD (peripheral vascular disease) ([place]) 11/20/2014  . Allergic rhinitis 11/20/2014  . At risk for falling 11/20/2014  . Phantom limb syndrome with pain ([place]) 11/05/2014  . Anxiety 11/05/2014  . Elevated serum creatinine 11/05/2014  . Diabetes mellitus type 2, controlled ([place]) 11/05/2014  . H/O adenomatous polyp of colon 07/27/2010    Past Medical History:  Diagnosis Date  . Anemia    low iron  . Anxiety   . Arthritis   . Bilateral cataracts   . Bronchitis    hx of   .  Cataract   . Chronic kidney disease   . Depression   . Diabetes mellitus without complication ([place])   . 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 GERD (gastroesophageal reflux disease)   . Headache   . History of frequent urinary tract infections   . Hx of renal cell cancer    LEFT  . Hyperlipidemia   . Hypertension   . Numbness and tingling    right hand   . Obesity   . Osteopenia 10/05/2017  . Peripheral artery disease ([place])   . Urinary frequency   . Urinary incontinence     Past Surgical History:  Procedure Laterality Date  . ABDOMINAL HYSTERECTOMY  1978  . CARPAL TUNNEL RELEASE Bilateral   . CHOLECYSTECTOMY    . COLONOSCOPY    . COLONOSCOPY WITH ESOPHAGOGASTRODUODENOSCOPY (EGD)    . ERCP N/A 08/19/2017   Procedure: ENDOSCOPIC RETROGRADE CHOLANGIOPANCREATOGRAPHY (ERCP);  Surgeon: Arta Silence, MD;  Location: [facility] ENDOSCOPY;  Service: Endoscopy;  Laterality: N/A;  . ESOPHAGOGASTRODUODENOSCOPY (EGD) WITH PROPOFOL N/A 10/12/2018   Procedure: ESOPHAGOGASTRODUODENOSCOPY (EGD) WITH PROPOFOL;  Surgeon: Virgel Manifold, MD;  Location: [facility] ENDOSCOPY;  Service: Endoscopy;  Laterality: N/A;  . LEG AMPUTATION     above the knee / left   . ROBOT ASSISTED LAPAROSCOPIC NEPHRECTOMY Left 01/15/2015   Procedure: ROBOTIC ASSISTED LAPAROSCOPIC RADICAL NEPHRECTOMY;  Surgeon: Alexis Frock, MD;  Location: [facility] ORS;  Service: Urology;  Laterality: Left;  . stent placement right leg       Social History   Tobacco Use  . Smoking status: Former Smoker    Packs/day: 1.50    Years: 51.00    Pack years: 76.50    Types: Cigarettes    Quit date: 05/24/2004    Years since quitting: 14.5  . Smokeless tobacco: Never Used  . Tobacco comment: smoking cessation materials not required  Substance Use Topics  . Alcohol use: Not Currently     Current Outpatient Medications:  .  acetaminophen (TYLENOL) 325 MG tablet, Take 325 mg by mouth every 6 (six) hours  as needed., Disp: , Rfl:  .  albuterol (VENTOLIN HFA) 108 (90 Base) MCG/ACT inhaler, Inhale 1-2 puffs into the lungs every 4 (four) hours as needed. , Disp: , Rfl:  .  Ascorbic Acid (VITAMIN C) 1000 MG tablet, Take 1,000 mg by mouth daily., Disp: , Rfl:  .  atorvastatin (LIPITOR) 20 MG tablet, TAKE 1 TABLET EVERY NIGHT AT BEDTIME, Disp: 90 tablet, Rfl: 0 .  clopidogrel (PLAVIX) 75 MG tablet, TAKE 1 TABLET EVERY DAY, Disp: 90 tablet, Rfl: 3 .  donepezil (ARICEPT) 10 MG tablet, Take 1 tablet (10 mg total) by mouth at bedtime., Disp: 90 tablet, Rfl: 3 .  famotidine (PEPCID) 20 MG tablet, Take 20 mg by mouth daily. ,  Disp: , Rfl:  .  FLUoxetine (PROZAC) 10 MG capsule, Take 1 capsule (10 mg total) by mouth daily., Disp: 30 capsule, Rfl: 0 .  gabapentin (NEURONTIN) 300 MG capsule, Take 1 capsule (300 mg total) by mouth 3 (three) times daily., Disp: 270 capsule, Rfl: 3 .  [START ON 01/03/2019] HYDROcodone-acetaminophen (NORCO) 7.5-325 MG tablet, Take 1 tablet by mouth every 8 (eight) hours as needed for severe pain. Must last 30 days., Disp: 90 tablet, Rfl: 0 .  [START ON 02/02/2019] HYDROcodone-acetaminophen (NORCO) 7.5-325 MG tablet, Take 1 tablet by mouth every 8 (eight) hours as needed for severe pain. Must last 30 days., Disp: 90 tablet, Rfl: 0 .  ipratropium-albuterol (DUONEB) 0.5-2.5 (3) MG/3ML SOLN, INHALE THE CONTENTS OF 1 VIAL VIA NEBULIZER THREE TIMES DAILY AS DIRECTED (Patient taking differently: Inhale 3 mLs into the lungs 3 (three) times daily. ), Disp: 810 mL, Rfl: 2 .  pantoprazole (PROTONIX) 40 MG tablet, Take 1 tablet (40 mg total) by mouth daily., Disp: 90 tablet, Rfl: 0 .  QUEtiapine (SEROQUEL) 25 MG tablet, Take 1 tablet (25 mg total) by mouth See admin instructions. Take 1 tablet in a.m. and 1 tablet in the afternoon and 2 tablets(50 mg total) before bedtime around 8 to 9 PM (Patient taking differently: Take 25 mg by mouth See admin instructions. Taking 1 tab at 2 pm, and 3 tabs at hs.),  Disp: 150 tablet, Rfl: 0 .  QUEtiapine (SEROQUEL) 50 MG tablet, Take 50 mg by mouth at bedtime., Disp: , Rfl:  .  Thiamine HCl (VITAMIN B-1) 250 MG tablet, Take 250 mg by mouth daily., Disp: , Rfl:  .  vitamin C (ASCORBIC ACID) 250 MG tablet, Take 250 mg by mouth daily., Disp: , Rfl:  .  ciprofloxacin (CIPRO) 250 MG tablet, Take 250 mg by mouth 2 (two) times daily. , Disp: , Rfl:  .  fluconazole (DIFLUCAN) 150 MG tablet, Take 1 tablet (150 mg total) by mouth daily. Take 1 tablet (150 mg) by mouth. Three days later, take second tablet. (Patient not taking: Reported on 12/18/2018), Disp: 2 tablet, Rfl: 0  Allergies  Allergen Reactions  . Fentanyl Other (See Comments)    urine retention  . Penicillins Itching, Rash and Other (See Comments)    Has patient had a PCN reaction causing immediate rash, facial/tongue/throat swelling, SOB or lightheadedness with hypotension: Yes Has patient had a PCN reaction causing severe rash involving mucus membranes or skin necrosis: No Has patient had a PCN reaction that required hospitalization: No Has patient had a PCN reaction occurring within the last 10 years: Yes If all of the above answers are "NO", then may proceed with Cephalosporin use.    ROS   No other specific complaints in a complete review of systems (except as listed in HPI above).  Objective  Vitals:   12/22/18 1447  BP: 118/70  Pulse: 62  Resp: 14  Temp: (!) 97.5 F (36.4 C)  TempSrc: Temporal  SpO2: 95%    There is no height or weight on file to calculate BMI.  Nursing Note and Vital Signs reviewed.  Physical Exam Constitutional:      Appearance: Normal appearance.  Cardiovascular:     Rate and Rhythm: Normal rate.  Pulmonary:     Effort: Pulmonary effort is normal.  Genitourinary:   Neurological:     Mental Status: She is alert.  Psychiatric:        Mood and Affect: Mood normal.  Behavior: Behavior normal.        No results found for this or any  previous visit (from the past 48 hour(s)).  Assessment & Plan  1. Vaginal sore Recommend keeping area clean and dry as much as possible.  Put small amount of bacitracin ointment twice daily and cover and Desitin cream.  If not improving in 2 weeks or at any time if acutely worsening follow-up sooner can put in wound referral if needed. - polymixin-bacitracin (POLYSPORIN) 500-10000 UNIT/GM OINT ointment; Apply 1 application topically 2 (two) times daily.  Dispense: 14 g; Refill: 2

## 2018-12-22 NOTE — Chronic Care Management (AMB) (Signed)
  Chronic Care Management   Follow Up Note   12/23/2018 Name: Amy [name] MRN: [id number] DOB: 29-Mar-1936  Referred by: [name], FNP Reason for referral : Chronic Care Management (follow up)   Subjective: per husband "I would be OK if she went to a nursing home because I can't keep caring for her"    Objective:  Assessment: Amy [name] is an 83 year old female patient of [facility] who was referred to chronic case management by her health plan. She has a history of but not limited to renal cell carcinoma, gastric carcinoma currently receiving palliative care, AKA (wheelchair/bed bound), COPD, and late onset Alzheimers. RN CM previously attempted initial health assessment but soon identified severe caregiver strain from husband. Contacted palliative care during last encounter to facilitate re-engagement with patient and family. Today I followed up with husband to assess for continued caregiver strain and to assess for palliative care SW involvement.  Review of patient status, including review of consultants reports, relevant laboratory and other test results, and collaboration with appropriate care team members and the patient's provider was performed as part of comprehensive patient evaluation and provision of chronic care management services.    Goals Addressed            This Visit's Progress   . per husband "I just don't know how much longer I can care for her" (pt-stated)       Current Barriers:  . Lacks caregiver support.  . Dementia . AKA needing total care for toileting, bathing, transfer  Nurse Case Manager Clinical Goal(s):  [name] Over the next 14 days, patients husband will engage with palliative care SW to discuss custodial care options  Interventions:  . Active listening . Provided emotional support . Assessed for caregiver strain and additional family support . Colloborated with Palliative Care to ensure SW contact  would be completed over the next 14 days  Patient Self Care Activities:   . Patient/caregiver will engage with palliative care   Please see past updates related to this goal by clicking on the "Past Updates" button in the selected goal           Telephone follow up appointment with care management team member scheduled for: 3 weeks     Sybel Standish E. Rollene Rotunda, RN, BSN Nurse Care Coordinator [facility] Care Management  [phone]

## 2018-12-23 NOTE — Patient Instructions (Signed)
  Thank you allowing the Chronic Care Management Team to be a part of your care! It was a pleasure speaking with you today!  1. Please consider paying for additional in home private help 2. I have contacted Palliative Care Team to request SW referral for you  CCM (Chronic Care Management) Team   Trish Fountain RN, BSN Nurse Care Coordinator  [phone]  Ruben Reason PharmD  Clinical Pharmacist  [phone]   Elliot Gurney, LCSW Clinical Social Worker [phone]  Goals Addressed            This Visit's Progress   . per husband "I just don't know how much longer I can care for her" (pt-stated)       Current Barriers:  . Lacks caregiver support.  . Dementia . AKA needing total care for toileting, bathing, transfer  Nurse Case Manager Clinical Goal(s):  [name] Over the next 14 days, patients husband will engage with palliative care SW to discuss custodial care options  Interventions:  . Active listening . Provided emotional support . Assessed for caregiver strain and additional family support . Colloborated with Palliative Care to ensure SW contact would be completed over the next 14 days  Patient Self Care Activities:  . Patient/caregiver will engage with palliative care   Please see past updates related to this goal by clicking on the "Past Updates" button in the selected goal          The patient verbalized understanding of instructions provided today and declined a print copy of patient instruction materials.   Telephone follow up appointment with care management team member scheduled for:  3 weeks SYMPTOMS OF A STROKE   You have any symptoms of stroke. "BE FAST" is an easy way to remember the main warning signs: ? B - Balance. Signs are dizziness, sudden trouble walking, or loss of balance. ? E - Eyes. Signs are trouble seeing or a sudden change in how you see. ? F - Face. Signs are sudden weakness or loss of feeling of the face, or the face or eyelid  drooping on one side. ? A - Arms. Signs are weakness or loss of feeling in an arm. This happens suddenly and usually on one side of the body. ? S - Speech. Signs are sudden trouble speaking, slurred speech, or trouble understanding what people say. ? T - Time. Time to call emergency services. Write down what time symptoms started.  You have other signs of stroke, such as: ? A sudden, very bad headache with no known cause. ? Feeling sick to your stomach (nausea). ? Throwing up (vomiting). ? Jerky movements you cannot control (seizure).  SYMPTOMS OF A HEART ATTACK  What are the signs or symptoms? Symptoms of this condition include:  Chest pain. It may feel like: ? Crushing or squeezing. ? Tightness, pressure, fullness, or heaviness.  Pain in the arm, neck, jaw, back, or upper body.  Shortness of breath.  Heartburn.  Indigestion.  Nausea.  Cold sweats.  Feeling tired.  Sudden lightheadedness.

## 2018-12-25 ENCOUNTER — Ambulatory Visit
Admission: RE | Admit: 2018-12-25 | Discharge: 2018-12-25 | Disposition: A | Discharge status: Home / Self Care | Payer: Medicare HMO | Source: Ambulatory Visit | Attending: Radiation Oncology | Admitting: Radiation Oncology

## 2018-12-25 ENCOUNTER — Other Ambulatory Visit: Payer: Self-pay

## 2018-12-25 DIAGNOSIS — Z510 Encounter for antineoplastic radiation therapy: Secondary | ICD-10-CM | POA: Insufficient documentation

## 2018-12-25 DIAGNOSIS — C162 Malignant neoplasm of body of stomach: Secondary | ICD-10-CM | POA: Diagnosis not present

## 2018-12-26 ENCOUNTER — Other Ambulatory Visit: Payer: Self-pay

## 2018-12-26 ENCOUNTER — Ambulatory Visit: Payer: Medicare HMO | Admitting: Family Medicine

## 2018-12-26 ENCOUNTER — Ambulatory Visit
Admission: RE | Admit: 2018-12-26 | Discharge: 2018-12-26 | Disposition: A | Discharge status: Home / Self Care | Payer: Medicare HMO | Source: Ambulatory Visit | Attending: Radiation Oncology | Admitting: Radiation Oncology

## 2018-12-26 DIAGNOSIS — C162 Malignant neoplasm of body of stomach: Secondary | ICD-10-CM | POA: Diagnosis not present

## 2018-12-26 DIAGNOSIS — Z510 Encounter for antineoplastic radiation therapy: Secondary | ICD-10-CM | POA: Diagnosis not present

## 2018-12-27 ENCOUNTER — Ambulatory Visit: Payer: Medicare HMO

## 2018-12-27 ENCOUNTER — Ambulatory Visit
Admission: RE | Admit: 2018-12-27 | Discharge: 2018-12-27 | Disposition: A | Discharge status: Home / Self Care | Payer: Medicare HMO | Source: Ambulatory Visit | Attending: Radiation Oncology | Admitting: Radiation Oncology

## 2018-12-27 ENCOUNTER — Other Ambulatory Visit: Payer: Self-pay

## 2018-12-27 ENCOUNTER — Other Ambulatory Visit: Payer: Self-pay | Admitting: Family Medicine

## 2018-12-27 DIAGNOSIS — N183 Chronic kidney disease, stage 3 (moderate): Secondary | ICD-10-CM | POA: Diagnosis not present

## 2018-12-27 DIAGNOSIS — N390 Urinary tract infection, site not specified: Secondary | ICD-10-CM | POA: Diagnosis not present

## 2018-12-27 DIAGNOSIS — Z7951 Long term (current) use of inhaled steroids: Secondary | ICD-10-CM | POA: Diagnosis not present

## 2018-12-27 DIAGNOSIS — Z510 Encounter for antineoplastic radiation therapy: Secondary | ICD-10-CM | POA: Diagnosis not present

## 2018-12-27 DIAGNOSIS — F0390 Unspecified dementia without behavioral disturbance: Secondary | ICD-10-CM | POA: Diagnosis not present

## 2018-12-27 DIAGNOSIS — E1122 Type 2 diabetes mellitus with diabetic chronic kidney disease: Secondary | ICD-10-CM | POA: Diagnosis not present

## 2018-12-27 DIAGNOSIS — C162 Malignant neoplasm of body of stomach: Secondary | ICD-10-CM | POA: Diagnosis not present

## 2018-12-27 DIAGNOSIS — J441 Chronic obstructive pulmonary disease with (acute) exacerbation: Secondary | ICD-10-CM | POA: Diagnosis not present

## 2018-12-27 DIAGNOSIS — Z89612 Acquired absence of left leg above knee: Secondary | ICD-10-CM | POA: Diagnosis not present

## 2018-12-28 ENCOUNTER — Ambulatory Visit
Admission: RE | Admit: 2018-12-28 | Discharge: 2018-12-28 | Disposition: A | Discharge status: Home / Self Care | Payer: Medicare HMO | Source: Ambulatory Visit | Attending: Radiation Oncology | Admitting: Radiation Oncology

## 2018-12-28 ENCOUNTER — Other Ambulatory Visit: Payer: Self-pay

## 2018-12-28 DIAGNOSIS — C162 Malignant neoplasm of body of stomach: Secondary | ICD-10-CM | POA: Diagnosis not present

## 2018-12-28 DIAGNOSIS — Z510 Encounter for antineoplastic radiation therapy: Secondary | ICD-10-CM | POA: Diagnosis not present

## 2018-12-31 DIAGNOSIS — J449 Chronic obstructive pulmonary disease, unspecified: Secondary | ICD-10-CM | POA: Diagnosis not present

## 2019-01-01 DIAGNOSIS — J9601 Acute respiratory failure with hypoxia: Secondary | ICD-10-CM | POA: Diagnosis not present

## 2019-01-02 ENCOUNTER — Other Ambulatory Visit: Payer: Self-pay

## 2019-01-02 ENCOUNTER — Ambulatory Visit (INDEPENDENT_AMBULATORY_CARE_PROVIDER_SITE_OTHER): Payer: Medicare HMO | Admitting: Vascular Surgery

## 2019-01-02 ENCOUNTER — Ambulatory Visit (INDEPENDENT_AMBULATORY_CARE_PROVIDER_SITE_OTHER): Payer: Medicare HMO

## 2019-01-02 ENCOUNTER — Encounter (INDEPENDENT_AMBULATORY_CARE_PROVIDER_SITE_OTHER): Payer: Self-pay | Admitting: Vascular Surgery

## 2019-01-02 VITALS — BP 136/80 | HR 92 | Resp 16

## 2019-01-02 DIAGNOSIS — Z89612 Acquired absence of left leg above knee: Secondary | ICD-10-CM

## 2019-01-02 DIAGNOSIS — I739 Peripheral vascular disease, unspecified: Secondary | ICD-10-CM

## 2019-01-02 DIAGNOSIS — N184 Chronic kidney disease, stage 4 (severe): Secondary | ICD-10-CM | POA: Diagnosis not present

## 2019-01-02 DIAGNOSIS — E1122 Type 2 diabetes mellitus with diabetic chronic kidney disease: Secondary | ICD-10-CM

## 2019-01-02 NOTE — Progress Notes (Signed)
MRN : [id number]  Amy [name] is a 83 y.o. ([birth year]) female who presents with chief complaint of  Chief Complaint  Patient presents with  . Follow-up    38month abi  .  History of Present Illness: Patient returns today in follow up of her PAD.  She lost her left leg over a decade ago.  She has had significant PAD on the right leg but it has been fairly stable over time.  No new limb threatening symptoms on the right. Her ABIs today are slightly lower at 0.64 but her digit pressure is stable at 0.50.  Current Outpatient Medications  Medication Sig Dispense Refill  . acetaminophen (TYLENOL) 325 MG tablet Take 325 mg by mouth every 6 (six) hours as needed.    [name] albuterol (VENTOLIN HFA) 108 (90 Base) MCG/ACT inhaler Inhale 1-2 puffs into the lungs every 4 (four) hours as needed.     . Ascorbic Acid (VITAMIN C) 1000 MG tablet Take 1,000 mg by mouth daily.    [name] atorvastatin (LIPITOR) 20 MG tablet TAKE 1 TABLET EVERY NIGHT AT BEDTIME 90 tablet 0  . ciprofloxacin (CIPRO) 250 MG tablet Take 250 mg by mouth 2 (two) times daily.     . clopidogrel (PLAVIX) 75 MG tablet TAKE 1 TABLET EVERY DAY 90 tablet 3  . donepezil (ARICEPT) 10 MG tablet Take 1 tablet (10 mg total) by mouth at bedtime. 90 tablet 3  . famotidine (PEPCID) 20 MG tablet Take 20 mg by mouth daily.     [name] FLUoxetine (PROZAC) 10 MG capsule Take 1 capsule (10 mg total) by mouth daily. 30 capsule 0  . gabapentin (NEURONTIN) 300 MG capsule Take 1 capsule (300 mg total) by mouth 3 (three) times daily. 270 capsule 3  . [START ON 01/03/2019] HYDROcodone-acetaminophen (NORCO) 7.5-325 MG tablet Take 1 tablet by mouth every 8 (eight) hours as needed for severe pain. Must last 30 days. 90 tablet 0  . [START ON 02/02/2019] HYDROcodone-acetaminophen (NORCO) 7.5-325 MG tablet Take 1 tablet by mouth every 8 (eight) hours as needed for severe pain. Must last 30 days. 90 tablet 0  . ipratropium-albuterol (DUONEB) 0.5-2.5 (3) MG/3ML SOLN INHALE  THE CONTENTS OF 1 VIAL VIA NEBULIZER THREE TIMES DAILY AS DIRECTED (Patient taking differently: Inhale 3 mLs into the lungs 3 (three) times daily. ) 810 mL 2  . pantoprazole (PROTONIX) 40 MG tablet Take 1 tablet (40 mg total) by mouth daily. 90 tablet 0  . polymixin-bacitracin (POLYSPORIN) 500-10000 UNIT/GM OINT ointment Apply 1 application topically 2 (two) times daily. 14 g 2  . QUEtiapine (SEROQUEL) 25 MG tablet TAKE 1 TABLET EVERY MORNING, 1 TABLET EVERY AFTERNOON AND 2 TABLETS BEFORE BEDTIME 150 tablet 1  . Thiamine HCl (VITAMIN B-1) 250 MG tablet Take 250 mg by mouth daily.    . vitamin C (ASCORBIC ACID) 250 MG tablet Take 250 mg by mouth daily.    . fluconazole (DIFLUCAN) 150 MG tablet Take 1 tablet (150 mg total) by mouth daily. Take 1 tablet (150 mg) by mouth. Three days later, take second tablet. (Patient not taking: Reported on 12/18/2018) 2 tablet 0   No current facility-administered medications for this visit.     Past Medical History:  Diagnosis Date  . Anemia    low iron  . Anxiety   . Arthritis   . Bilateral cataracts   . Bronchitis    hx of   . Cataract   . Chronic kidney disease   .  Depression   . Diabetes mellitus without complication ([place])   . 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 GERD (gastroesophageal reflux disease)   . Headache   . History of frequent urinary tract infections   . Hx of renal cell cancer    LEFT  . Hyperlipidemia   . Hypertension   . Numbness and tingling    right hand   . Obesity   . Osteopenia 10/05/2017  . Peripheral artery disease ([place])   . Urinary frequency   . Urinary incontinence     Past Surgical History:  Procedure Laterality Date  . ABDOMINAL HYSTERECTOMY  1978  . CARPAL TUNNEL RELEASE Bilateral   . CHOLECYSTECTOMY    . COLONOSCOPY    . COLONOSCOPY WITH ESOPHAGOGASTRODUODENOSCOPY (EGD)    . ERCP N/A 08/19/2017   Procedure: ENDOSCOPIC RETROGRADE CHOLANGIOPANCREATOGRAPHY (ERCP);   Surgeon: Arta Silence, MD;  Location: [facility] ENDOSCOPY;  Service: Endoscopy;  Laterality: N/A;  . ESOPHAGOGASTRODUODENOSCOPY (EGD) WITH PROPOFOL N/A 10/12/2018   Procedure: ESOPHAGOGASTRODUODENOSCOPY (EGD) WITH PROPOFOL;  Surgeon: Virgel Manifold, MD;  Location: [facility] ENDOSCOPY;  Service: Endoscopy;  Laterality: N/A;  . LEG AMPUTATION     above the knee / left   . ROBOT ASSISTED LAPAROSCOPIC NEPHRECTOMY Left 01/15/2015   Procedure: ROBOTIC ASSISTED LAPAROSCOPIC RADICAL NEPHRECTOMY;  Surgeon: Alexis Frock, MD;  Location: [facility] ORS;  Service: Urology;  Laterality: Left;  . stent placement right leg       Social History Social History   Tobacco Use  . Smoking status: Former Smoker    Packs/day: 1.50    Years: 51.00    Pack years: 76.50    Types: Cigarettes    Quit date: 05/24/2004    Years since quitting: 14.6  . Smokeless tobacco: Never Used  . Tobacco comment: smoking cessation materials not required  Substance Use Topics  . Alcohol use: Not Currently  . Drug use: No     Family History Family History  Problem Relation Age of Onset  . Cancer Mother        Stomach  . Cancer Father   . Diabetes Brother   . Cancer Brother        oral  . Healthy Son   . Healthy Sister     Allergies  Allergen Reactions  . Fentanyl Other (See Comments)    urine retention  . Penicillins Itching, Rash and Other (See Comments)    Has patient had a PCN reaction causing immediate rash, facial/tongue/throat swelling, SOB or lightheadedness with hypotension: Yes Has patient had a PCN reaction causing severe rash involving mucus membranes or skin necrosis: No Has patient had a PCN reaction that required hospitalization: No Has patient had a PCN reaction occurring within the last 10 years: Yes If all of the above answers are "NO", then may proceed with Cephalosporin use.     REVIEW OF SYSTEMS (Negative unless checked)  Constitutional: [] Weight loss  [] Fever  [] Chills Cardiac: [] Chest pain    [] Chest pressure   [] Palpitations   [] Shortness of breath when laying flat   [] Shortness of breath at rest   [] Shortness of breath with exertion. Vascular:  [] Pain in legs with walking   [] Pain in legs at rest   [] Pain in legs when laying flat   [] Claudication   [] Pain in feet when walking  [] Pain in feet at rest  [] Pain in feet when laying flat   [] History of DVT   []   Phlebitis   [] Swelling in legs   [] Varicose veins   [] Non-healing ulcers Pulmonary:   [] Uses home oxygen   [] Productive cough   [] Hemoptysis   [] Wheeze  [] COPD   [] Asthma Neurologic:  [] Dizziness  [] Blackouts   [] Seizures   [] History of stroke   [] History of TIA  [] Aphasia   [] Temporary blindness   [] Dysphagia   [] Weakness or numbness in arms   [] Weakness or numbness in legs  X positive for dementia Musculoskeletal:  [x] Arthritis   [] Joint swelling   [x] Joint pain   [] Low back pain Hematologic:  [] Easy bruising  [] Easy bleeding   [] Hypercoagulable state   [] Anemic   Gastrointestinal:  [] Blood in stool   [] Vomiting blood  [] Gastroesophageal reflux/heartburn   [] Abdominal pain Genitourinary:  [x] Chronic kidney disease   [] Difficult urination  [] Frequent urination  [] Burning with urination   [] Hematuria Skin:  [] Rashes   [] Ulcers   [] Wounds Psychological:  [] History of anxiety   []  History of major depression.  Physical Examination  BP 136/80 (BP Location: Right Arm)   Pulse 92   Resp 16  Gen:  WD/WN, NAD Head: [place]/AT, No temporalis wasting. Ear/Nose/Throat: Hearing grossly intact, nares w/o erythema or drainage Eyes: Conjunctiva clear. Sclera non-icteric Neck: Supple.  Trachea midline Pulmonary:  Good air movement, no use of accessory muscles.  Cardiac: RRR, no JVD Vascular:  Vessel Right Left  Radial Palpable Palpable                          PT Trace Palpable Not Palpable  DP 1+ Palpable Not Palpable    Musculoskeletal: M/S 5/5 throughout.  No deformity or atrophy. Left AKA. No right leg edema. Neurologic: Sensation  grossly intact in extremities.  Symmetrical.  Speech is fluent.  Psychiatric: Judgment and insight are poor at this time Dermatologic: No rashes or ulcers noted.  No cellulitis or open wounds.       Labs Recent Results (from the past 2160 hour(s))  Novel Coronavirus, NAA (hospital order; send-out to ref lab)     Status: None   Collection Time: 10/09/18 10:23 AM   Specimen: Nasopharyngeal Swab; Respiratory  Result Value Ref Range   SARS-CoV-2, NAA NOT DETECTED NOT DETECTED    Comment: (NOTE) This test was developed and its performance characteristics determined by [employer]. This test has not been FDA cleared or approved. This test has been authorized by FDA under an Emergency Use Authorization (EUA). This test is only authorized for the duration of time the declaration that circumstances exist justifying the authorization of the emergency use of in vitro diagnostic tests for detection of SARS-CoV-2 virus and/or diagnosis of COVID-19 infection under section 564(b)(1) of the Act, 21 U.S.C. 195KDT-2(I)(7), unless the authorization is terminated or revoked sooner. When diagnostic testing is negative, the possibility of a false negative result should be considered in the context of a patient's recent exposures and the presence of clinical signs and symptoms consistent with COVID-19. An individual without symptoms of COVID-19 and who is not shedding SARS-CoV-2 virus would expect to have a negative (not detected) result in this assay. Performed  At: [facility] [place], [place] [id number] Rush Farmer MD PJ:[id number]    Coronavirus Source NASOPHARYNGEAL     Comment: Performed at [facility], [address] [place], [place] [address]  Glucose, capillary     Status: None   Collection Time: 10/12/18  7:42 AM  Result Value Ref Range  Glucose-Capillary 98 70 - 99 mg/dL  Surgical pathology     Status: None   Collection Time: 10/12/18   8:18 AM  Result Value Ref Range   SURGICAL PATHOLOGY      Surgical Pathology CASE: [id number] PATIENT: August Luz Surgical Pathology Report     SPECIMEN SUBMITTED: A. Stomach mass, cardia; cbx B. Stomach, r/o h pylori; cbx  CLINICAL HISTORY: None provided  PRE-OPERATIVE DIAGNOSIS: Abnormal PET scan with incidental hypermetabolic lesion just below GEJ.  POST-OPERATIVE DIAGNOSIS: Gastric cardia mass, gastric erythema     DIAGNOSIS: A. STOMACH MASS, CARDIA; COLD BIOPSY: - ADENOCARCINOMA, MODERATELY DIFFERENTIATED INTESTINAL TYPE, AT LEAST INTRAMUCOSAL. - INTESTINAL METAPLASIA INVOLVING ONE SUPERFICIAL FRAGMENT OF NON-NEOPLASTIC MUCOSA.  B. STOMACH; COLD BIOPSY: - ANTRAL AND OXYNTIC MUCOSA WITH MILD CHRONIC INACTIVE GASTRITIS. - NEGATIVE FOR H. PYLORI, INTESTINAL METAPLASIA, DYSPLASIA, AND MALIGNANCY.  Comment: The diagnosis on part A was communicated to Dr. Bonna Gains on 10/13/2018 via [place].  GROSS DESCRIPTION: A. Labeled: C BX gastric cardia mass Received: Formalin Tissue fragment(s) : Multiple Size: Aggregate, 0.6 x 0.2 x 0.1 cm Description: Tan soft tissue fragments Entirely submitted in 1 cassette.  B. Labeled: C BX gastric region rule out H. pylori Received: Formalin Tissue fragment(s): Multiple Size: Aggregate, 1.0 x 0.2 x 0.1 cm Description: Tan soft tissue fragments Entirely submitted in 1 cassette.   Final Diagnosis performed by Bryan Lemma, MD.   Electronically signed 10/13/2018 3:45:28PM The electronic signature indicates that the named Attending Pathologist has evaluated the specimen  Technical component performed at [facility], [address], [place], [place] [address] Lab: [phone] Dir: Rush Farmer, MD, MMM  Professional component performed at [facility], [facility], [place], [place], [place] [address] Lab: [phone] Dir: Dellia Nims. Rubinas, MD   Urinalysis, Complete w Microscopic     Status:  Abnormal   Collection Time: 10/27/18  4:11 PM  Result Value Ref Range   Color, Urine YELLOW (A) YELLOW   APPearance CLOUDY (A) CLEAR   Specific Gravity, Urine 1.006 1.005 - 1.030   pH 6.0 5.0 - 8.0   Glucose, UA NEGATIVE NEGATIVE mg/dL   Hgb urine dipstick LARGE (A) NEGATIVE   Bilirubin Urine NEGATIVE NEGATIVE   Ketones, ur NEGATIVE NEGATIVE mg/dL   Protein, ur NEGATIVE NEGATIVE mg/dL   Nitrite NEGATIVE NEGATIVE   Leukocytes,Ua LARGE (A) NEGATIVE   RBC / HPF >50 (H) 0 - 5 RBC/hpf   WBC, UA >50 (H) 0 - 5 WBC/hpf   Bacteria, UA RARE (A) NONE SEEN   Squamous Epithelial / LPF 0-5 0 - 5   WBC Clumps PRESENT     Comment: Performed at [facility], [address], [place], [place] [address]  Urine Culture     Status: Abnormal   Collection Time: 10/27/18  4:11 PM   Specimen: Urine, Random  Result Value Ref Range   Specimen Description      URINE, RANDOM Performed at [facility], [address] [place], [place] [address]    Special Requests      NONE Performed at [facility], [address] [place], [place] [address]    Culture >=100,000 COLONIES/mL [facility] MORGANII (A)    Report Status 10/31/2018 FINAL    Organism ID, Bacteria MORGANELLA MORGANII (A)       Susceptibility   Morganella morganii - MIC*    AMPICILLIN >=32 RESISTANT Resistant     CEFAZOLIN >=64 RESISTANT Resistant     CEFTRIAXONE <=1 SENSITIVE Sensitive  CIPROFLOXACIN <=0.25 SENSITIVE Sensitive     GENTAMICIN <=1 SENSITIVE Sensitive     IMIPENEM 4 SENSITIVE Sensitive     NITROFURANTOIN 128 RESISTANT Resistant     TRIMETH/SULFA <=20 SENSITIVE Sensitive     AMPICILLIN/SULBACTAM >=32 RESISTANT Resistant     PIP/TAZO <=4 SENSITIVE Sensitive     * >=100,000 COLONIES/mL MORGANELLA MORGANII  Lipase, blood     Status: None   Collection Time: 10/27/18  7:18 PM  Result Value Ref Range   Lipase 26 11 - 51 U/L    Comment: Performed at [facility], [address]  [place], [place] [address]  Comprehensive metabolic panel     Status: Abnormal   Collection Time: 10/27/18  7:18 PM  Result Value Ref Range   Sodium 139 135 - 145 mmol/L   Potassium 5.1 3.5 - 5.1 mmol/L   Chloride 109 98 - 111 mmol/L   CO2 22 22 - 32 mmol/L   Glucose, Bld 125 (H) 70 - 99 mg/dL   BUN 16 8 - 23 mg/dL   Creatinine, Ser 1.59 (H) 0.44 - 1.00 mg/dL   Calcium 8.4 (L) 8.9 - 10.3 mg/dL   Total Protein 6.1 (L) 6.5 - 8.1 g/dL   Albumin 3.0 (L) 3.5 - 5.0 g/dL   AST 18 15 - 41 U/L   ALT 11 0 - 44 U/L   Alkaline Phosphatase 89 38 - 126 U/L   Total Bilirubin 0.8 0.3 - 1.2 mg/dL   GFR calc non Af Amer 30 (L) >60 mL/min   GFR calc Af Amer 35 (L) >60 mL/min   Anion gap 8 5 - 15    Comment: Performed at [facility], [address] [place], [place] [address]  CBC     Status: Abnormal   Collection Time: 10/27/18  7:18 PM  Result Value Ref Range   WBC 10.3 4.0 - 10.5 K/uL   RBC 3.72 (L) 3.87 - 5.11 MIL/uL   Hemoglobin 10.5 (L) 12.0 - 15.0 g/dL   HCT 33.7 (L) 36.0 - 46.0 %   MCV 90.6 80.0 - 100.0 fL   MCH 28.2 26.0 - 34.0 pg   MCHC 31.2 30.0 - 36.0 g/dL   RDW 15.5 11.5 - 15.5 %   Platelets 237 150 - 400 K/uL   nRBC 0.0 0.0 - 0.2 %    Comment: Performed at [facility], [address] [place], [place] [address]  Lipase, blood     Status: None   Collection Time: 11/17/18  3:37 PM  Result Value Ref Range   Lipase 34 11 - 51 U/L    Comment: Performed at [facility], [address] [place], [place] [address]  Comprehensive metabolic panel     Status: Abnormal   Collection Time: 11/17/18  3:37 PM  Result Value Ref Range   Sodium 140 135 - 145 mmol/L   Potassium 5.0 3.5 - 5.1 mmol/L   Chloride 109 98 - 111 mmol/L   CO2 21 (L) 22 - 32 mmol/L   Glucose, Bld 107 (H) 70 - 99 mg/dL   BUN 18 8 - 23 mg/dL   Creatinine, Ser 1.75 (H) 0.44 - 1.00 mg/dL   Calcium 8.5 (L) 8.9 - 10.3 mg/dL   Total Protein 6.6 6.5 - 8.1 g/dL   Albumin 3.1 (L) 3.5 - 5.0 g/dL    AST 17 15 - 41 U/L   ALT 12 0 - 44 U/L   Alkaline Phosphatase 84 38 - 126  U/L   Total Bilirubin 0.6 0.3 - 1.2 mg/dL   GFR calc non Af Amer 27 (L) >60 mL/min   GFR calc Af Amer 31 (L) >60 mL/min   Anion gap 10 5 - 15    Comment: Performed at [facility], [address] [place], [place] [address]  CBC     Status: Abnormal   Collection Time: 11/17/18  3:37 PM  Result Value Ref Range   WBC 10.9 (H) 4.0 - 10.5 K/uL   RBC 3.81 (L) 3.87 - 5.11 MIL/uL   Hemoglobin 10.7 (L) 12.0 - 15.0 g/dL   HCT 34.2 (L) 36.0 - 46.0 %   MCV 89.8 80.0 - 100.0 fL   MCH 28.1 26.0 - 34.0 pg   MCHC 31.3 30.0 - 36.0 g/dL   RDW 15.8 (H) 11.5 - 15.5 %   Platelets 262 150 - 400 K/uL   nRBC 0.0 0.0 - 0.2 %    Comment: Performed at [facility], [address] [place],  [address]    Radiology Vas [place] Abi With/wo Tbi  Result Date: 01/02/2019 LOWER EXTREMITY DOPPLER STUDY Indications: Peripheral artery disease.  Vascular Interventions: Lt AKA, Bilat CIA stents 2007. Comparison Study: 06/30/2018 Performing Technologist: Charlane Ferretti RT (R)(VS)  Examination Guidelines: A complete evaluation includes at minimum, Doppler waveform signals and systolic blood pressure reading at the level of bilateral brachial, anterior tibial, and posterior tibial arteries, when vessel segments are accessible. Bilateral testing is considered an integral part of a complete examination. Photoelectric Plethysmograph (PPG) waveforms and toe systolic pressure readings are included as required and additional duplex testing as needed. Limited examinations for reoccurring indications may be performed as noted.  ABI Findings: +---------+------------------+-----+----------+--------+ Right    Rt Pressure (mmHg)IndexWaveform  Comment  +---------+------------------+-----+----------+--------+ Brachial 139                                       +---------+------------------+-----+----------+--------+ ATA      82                 0.59 monophasic         +---------+------------------+-----+----------+--------+ PTA      89                0.64 monophasic         +---------+------------------+-----+----------+--------+ Great Toe70                0.50 Abnormal           +---------+------------------+-----+----------+--------+ +--------+------------------+-----+--------+-------+ Left    Lt Pressure (mmHg)IndexWaveformComment +--------+------------------+-----+--------+-------+ [id number]                            AKA     +--------+------------------+-----+--------+-------+ +-------+-----------+-----------+------------+------------+ ABI/TBIToday's ABIToday's TBIPrevious ABIPrevious TBI +-------+-----------+-----------+------------+------------+ Right  .64        .50        .85         .53          +-------+-----------+-----------+------------+------------+ Right ABIs appear decreased compared to prior study on 06/30/2018. Right TBIs appear essentially unchanged compared to prior study on 06/30/2018.  Summary: Right: Resting right ankle-brachial index indicates moderate right lower extremity arterial disease. The right toe-brachial index is abnormal. Exam done in wheelchair. Left: AKA.  *See table(s) above for measurements and observations.  Electronically signed by Leotis Pain MD on 01/02/2019 at 12:05:18 PM.  Final     Assessment/Plan Diabetes mellitus type 2, controlled blood glucose control important in reducing the progression of atherosclerotic disease. Also, involved in wound healing. On appropriate medications.   BP (high blood pressure) blood pressure control important in reducing the progression of atherosclerotic disease. On appropriate oral medications.   S/P AKA (above knee amputation) unilateral, left ([place]) About 11years ago. Stable and well healed. With her cognitive decline she is no longer able to use her prosthesis.  PVD (peripheral vascular disease) ([place]) Her ABIs today  are slightly lower at 0.64 but her digit pressure is stable at 0.50.  No new worrisome symptoms.  Continue 28-month intervals with her high risk status but no changes at this time.    Leotis Pain, MD  01/02/2019 12:46 PM    This note was created with Dragon medical transcription system.  Any errors from dictation are purely unintentional

## 2019-01-02 NOTE — Assessment & Plan Note (Signed)
Her ABIs today are slightly lower at 0.64 but her digit pressure is stable at 0.50.  No new worrisome symptoms.  Continue 26-month intervals with her high risk status but no changes at this time.

## 2019-01-03 ENCOUNTER — Telehealth: Payer: Self-pay

## 2019-01-03 ENCOUNTER — Emergency Department: Payer: Medicare HMO

## 2019-01-03 ENCOUNTER — Emergency Department
Admission: EM | Admit: 2019-01-03 | Discharge: 2019-01-03 | Disposition: A | Discharge status: Home / Self Care | Payer: Medicare HMO | Attending: Emergency Medicine | Admitting: Emergency Medicine

## 2019-01-03 ENCOUNTER — Other Ambulatory Visit: Payer: Self-pay

## 2019-01-03 ENCOUNTER — Encounter: Payer: Self-pay | Admitting: Emergency Medicine

## 2019-01-03 DIAGNOSIS — Z85528 Personal history of other malignant neoplasm of kidney: Secondary | ICD-10-CM | POA: Diagnosis not present

## 2019-01-03 DIAGNOSIS — F0281 Dementia in other diseases classified elsewhere with behavioral disturbance: Secondary | ICD-10-CM | POA: Insufficient documentation

## 2019-01-03 DIAGNOSIS — R0789 Other chest pain: Secondary | ICD-10-CM

## 2019-01-03 DIAGNOSIS — Z89612 Acquired absence of left leg above knee: Secondary | ICD-10-CM | POA: Diagnosis not present

## 2019-01-03 DIAGNOSIS — J9811 Atelectasis: Secondary | ICD-10-CM | POA: Diagnosis not present

## 2019-01-03 DIAGNOSIS — G301 Alzheimer's disease with late onset: Secondary | ICD-10-CM | POA: Diagnosis not present

## 2019-01-03 DIAGNOSIS — Z79899 Other long term (current) drug therapy: Secondary | ICD-10-CM | POA: Insufficient documentation

## 2019-01-03 DIAGNOSIS — I129 Hypertensive chronic kidney disease with stage 1 through stage 4 chronic kidney disease, or unspecified chronic kidney disease: Secondary | ICD-10-CM | POA: Diagnosis not present

## 2019-01-03 DIAGNOSIS — Z87891 Personal history of nicotine dependence: Secondary | ICD-10-CM | POA: Diagnosis not present

## 2019-01-03 DIAGNOSIS — Z7901 Long term (current) use of anticoagulants: Secondary | ICD-10-CM | POA: Diagnosis not present

## 2019-01-03 DIAGNOSIS — R918 Other nonspecific abnormal finding of lung field: Secondary | ICD-10-CM | POA: Diagnosis not present

## 2019-01-03 DIAGNOSIS — E1122 Type 2 diabetes mellitus with diabetic chronic kidney disease: Secondary | ICD-10-CM | POA: Insufficient documentation

## 2019-01-03 DIAGNOSIS — N184 Chronic kidney disease, stage 4 (severe): Secondary | ICD-10-CM | POA: Insufficient documentation

## 2019-01-03 DIAGNOSIS — R079 Chest pain, unspecified: Secondary | ICD-10-CM | POA: Diagnosis present

## 2019-01-03 LAB — BASIC METABOLIC PANEL
Anion gap: 7 (ref 5–15)
BUN: 18 mg/dL (ref 8–23)
CO2: 24 mmol/L (ref 22–32)
Calcium: 8.7 mg/dL — ABNORMAL LOW (ref 8.9–10.3)
Chloride: 107 mmol/L (ref 98–111)
Creatinine, Ser: 1.68 mg/dL — ABNORMAL HIGH (ref 0.44–1.00)
GFR calc Af Amer: 33 mL/min — ABNORMAL LOW (ref >60)
GFR calc non Af Amer: 28 mL/min — ABNORMAL LOW (ref >60)
Glucose, Bld: 128 mg/dL — ABNORMAL HIGH (ref 70–99)
Potassium: 4.7 mmol/L (ref 3.5–5.1)
Sodium: 138 mmol/L (ref 135–145)

## 2019-01-03 LAB — CBC
HCT: 36.3 % (ref 36.0–46.0)
Hemoglobin: 11.4 g/dL — ABNORMAL LOW (ref 12.0–15.0)
MCH: 26.8 pg (ref 26.0–34.0)
MCHC: 31.4 g/dL (ref 30.0–36.0)
MCV: 85.4 fL (ref 80.0–100.0)
Platelets: 259 10*3/uL (ref 150–400)
RBC: 4.25 MIL/uL (ref 3.87–5.11)
RDW: 17 % — ABNORMAL HIGH (ref 11.5–15.5)
WBC: 11.7 10*3/uL — ABNORMAL HIGH (ref 4.0–10.5)
nRBC: 0 % (ref 0.0–0.2)

## 2019-01-03 LAB — TROPONIN I (HIGH SENSITIVITY)
Troponin I (High Sensitivity): 5 ng/L (ref <18)
Troponin I (High Sensitivity): 6 ng/L (ref <18)

## 2019-01-03 MED ORDER — SODIUM CHLORIDE 0.9% FLUSH: 3.0000 mL | Freq: Once | INTRAVENOUS | Status: DC

## 2019-01-03 MED ORDER — MORPHINE SULFATE (PF) 4 MG/ML IV SOLN
4.0000 mg | Freq: Once | INTRAVENOUS | Status: DC
  Filled 2019-01-03: qty 1

## 2019-01-03 MED ORDER — HYDROMORPHONE HCL 1 MG/ML IJ SOLN
0.5000 mg | Freq: Once | INTRAMUSCULAR | Status: AC
  Administered 2019-01-03: 0.5 mg via INTRAVENOUS
  Filled 2019-01-03: qty 1

## 2019-01-03 NOTE — ED Triage Notes (Addendum)
Pt c/o CP ( LFT axilla/ rib area) and SOB., PT states she wears o2 prn. PT is 98% on 2L but tachpnic. States she has anxiety. Denies any acute injury

## 2019-01-03 NOTE — ED Provider Notes (Signed)
[facility] Emergency Department Provider Note  Time seen: 4:13 PM  I have reviewed the triage vital signs and the nursing notes.   HISTORY  Chief Complaint Chest Pain   HPI Amy [name] is a 83 y.o. female with a past medical history of anemia, anxiety, depression, diabetes, hypertension, hyperlipidemia, presents to the emergency department for left-sided chest pain.  According to the patient and her husband she was doing well today, ate lunch normally, however shortly afterwards she coughed and ever since has been complaining of pain in her left lateral chest/left lower chest.  During my evaluation patient was complaining of pain in her left leg, but this is chronic phantom pain per husband.  He states he checked her blood pressure at home while she was in pain and it was 128 systolic, he was concerned so they brought her to the emergency department for evaluation.  Here patient appears well, slightly tachycardic, pulse ox 98% on 2 L of oxygen which is her chronic oxygen per husband.  Normal respiratory rate.  Denies any significant chest pain at this time.  Husband states at home she was complaining that she could not breathe due to the chest pain.   Past Medical History:  Diagnosis Date  . Anemia    low iron  . Anxiety   . Arthritis   . Bilateral cataracts   . Bronchitis    hx of   . Cataract   . Chronic kidney disease   . Depression   . Diabetes mellitus without complication ([place])   . 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 GERD (gastroesophageal reflux disease)   . Headache   . History of frequent urinary tract infections   . Hx of renal cell cancer    LEFT  . Hyperlipidemia   . Hypertension   . Numbness and tingling    right hand   . Obesity   . Osteopenia 10/05/2017  . Peripheral artery disease ([place])   . Urinary frequency   . Urinary incontinence     Patient Active Problem List   Diagnosis  Date Noted  . Genital labial ulcer 12/06/2018  . B12 deficiency 11/05/2018  . Folate deficiency 11/05/2018  . 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 Stomach irritation   . Abnormal finding on GI tract imaging   . Obesity (BMI 30.0-34.9) 09/11/2018  . Abnormal gastrointestinal PET scan 08/27/2018  . Pulmonary nodules 08/27/2018  . Goals of care, counseling/discussion   . Palliative care by specialist   . Hypoxia 07/26/2018  . HCAP (healthcare-associated pneumonia) 07/02/2018  . Hypoxemia   . COPD exacerbation ([place])   . Renal cell carcinoma ([place])   . Lung nodule 04/23/2018  . Hypertensive renal disease 11/08/2017  . Secondary hyperparathyroidism of renal origin ([place]) 11/08/2017  . QT prolongation 10/27/2017  . Osteopenia 10/05/2017  . Choledocholithiasis 08/13/2017  . Chronic pain syndrome 04/05/2017  . Depression 11/15/2016  . Iron deficiency anemia 09/08/2016  . Anemia 06/29/2016  . Late onset Alzheimer's disease with behavioral disturbance ([place]) 06/14/2016  . S/P AKA (above knee amputation) unilateral, left ([place]) 06/08/2016  . Wheelchair dependent 03/11/2016  . 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 ([place]) 03/13/2015  . S/p nephrectomy 01/29/2015  . Renal neoplasm 01/15/2015  . Abnormal EKG 01/02/2015  . Renal mass 12/05/2014  . GERD (gastroesophageal reflux disease) 11/20/2014  . GAD (generalized anxiety disorder) 11/20/2014  . AB (asthmatic  bronchitis) 11/20/2014  . Aortic atherosclerosis ([place]) 11/20/2014  . Cataract 11/20/2014  . Leg pain 11/20/2014  . Continuous opioid dependence ([place]) 11/20/2014  . CKD (chronic kidney disease) stage 4, GFR 15-29 ml/min (HCC) 11/20/2014  . COPD (chronic obstructive pulmonary disease) ([place]) 11/20/2014  . Excessive falling 11/20/2014  . Fatty infiltration of liver 11/20/2014  . Hyperlipidemia 11/20/2014  . Anterior knee pain 11/20/2014  . LBP (low back pain) 11/20/2014  . Malaise and fatigue 11/20/2014  . Abnormal presence of protein in  urine 11/20/2014  . PVD (peripheral vascular disease) ([place]) 11/20/2014  . Allergic rhinitis 11/20/2014  . At risk for falling 11/20/2014  . Phantom limb syndrome with pain ([place]) 11/05/2014  . Anxiety 11/05/2014  . Elevated serum creatinine 11/05/2014  . Diabetes mellitus type 2, controlled ([place]) 11/05/2014  . H/O adenomatous polyp of colon 07/27/2010    Past Surgical History:  Procedure Laterality Date  . ABDOMINAL HYSTERECTOMY  1978  . CARPAL TUNNEL RELEASE Bilateral   . CHOLECYSTECTOMY    . COLONOSCOPY    . COLONOSCOPY WITH ESOPHAGOGASTRODUODENOSCOPY (EGD)    . ERCP N/A 08/19/2017   Procedure: ENDOSCOPIC RETROGRADE CHOLANGIOPANCREATOGRAPHY (ERCP);  Surgeon: Arta Silence, MD;  Location: [facility] ENDOSCOPY;  Service: Endoscopy;  Laterality: N/A;  . ESOPHAGOGASTRODUODENOSCOPY (EGD) WITH PROPOFOL N/A 10/12/2018   Procedure: ESOPHAGOGASTRODUODENOSCOPY (EGD) WITH PROPOFOL;  Surgeon: Virgel Manifold, MD;  Location: [facility] ENDOSCOPY;  Service: Endoscopy;  Laterality: N/A;  . LEG AMPUTATION     above the knee / left   . ROBOT ASSISTED LAPAROSCOPIC NEPHRECTOMY Left 01/15/2015   Procedure: ROBOTIC ASSISTED LAPAROSCOPIC RADICAL NEPHRECTOMY;  Surgeon: Alexis Frock, MD;  Location: [facility] ORS;  Service: Urology;  Laterality: Left;  . stent placement right leg       Prior to Admission medications   Medication Sig Start Date End Date Taking? Authorizing Provider  acetaminophen (TYLENOL) 325 MG tablet Take 325 mg by mouth every 6 (six) hours as needed.    [provider]  albuterol (VENTOLIN HFA) 108 (90 Base) MCG/ACT inhaler Inhale 1-2 puffs into the lungs every 4 (four) hours as needed.  06/15/17   [provider]  Ascorbic Acid (VITAMIN C) 1000 MG tablet Take 1,000 mg by mouth daily.    [provider]  atorvastatin (LIPITOR) 20 MG tablet TAKE 1 TABLET EVERY NIGHT AT BEDTIME 10/17/18   Poulose, Bethel Born, NP  ciprofloxacin (CIPRO) 250 MG tablet Take 250 mg by mouth 2 (two)  times daily.  11/01/18   [provider]  clopidogrel (PLAVIX) 75 MG tablet TAKE 1 TABLET EVERY DAY 09/14/18   Algernon Huxley, MD  donepezil (ARICEPT) 10 MG tablet Take 1 tablet (10 mg total) by mouth at bedtime. 10/06/17   Arnetha Courser, MD  famotidine (PEPCID) 20 MG tablet Take 20 mg by mouth daily.  04/05/18   [provider]  fluconazole (DIFLUCAN) 150 MG tablet Take 1 tablet (150 mg total) by mouth daily. Take 1 tablet (150 mg) by mouth. Three days later, take second tablet. Patient not taking: Reported on 12/18/2018 12/07/18   Verlon Au, NP  FLUoxetine (PROZAC) 10 MG capsule Take 1 capsule (10 mg total) by mouth daily. 07/22/18   Nicholes Mango, MD  gabapentin (NEURONTIN) 300 MG capsule Take 1 capsule (300 mg total) by mouth 3 (three) times daily. 12/19/18   Gillis Santa, MD  HYDROcodone-acetaminophen (NORCO) 7.5-325 MG tablet Take 1 tablet by mouth every 8 (eight) hours as needed for severe pain. Must last  30 days. 01/03/19 02/02/19  Gillis Santa, MD  HYDROcodone-acetaminophen (NORCO) 7.5-325 MG tablet Take 1 tablet by mouth every 8 (eight) hours as needed for severe pain. Must last 30 days. 02/02/19 03/04/19  Gillis Santa, MD  ipratropium-albuterol (DUONEB) 0.5-2.5 (3) MG/3ML SOLN INHALE THE CONTENTS OF 1 VIAL VIA NEBULIZER THREE TIMES DAILY AS DIRECTED Patient taking differently: Inhale 3 mLs into the lungs 3 (three) times daily.  11/10/17   Laverle Hobby, MD  pantoprazole (PROTONIX) 40 MG tablet Take 1 tablet (40 mg total) by mouth daily. 11/14/18   Poulose, Bethel Born, NP  polymixin-bacitracin (POLYSPORIN) 500-10000 UNIT/GM OINT ointment Apply 1 application topically 2 (two) times daily. 12/22/18   Poulose, Bethel Born, NP  QUEtiapine (SEROQUEL) 25 MG tablet TAKE 1 TABLET EVERY MORNING, 1 TABLET EVERY AFTERNOON AND 2 TABLETS BEFORE BEDTIME 12/27/18   [name], FNP  Thiamine HCl (VITAMIN B-1) 250 MG tablet Take 250 mg by mouth daily.    [provider]   vitamin C (ASCORBIC ACID) 250 MG tablet Take 250 mg by mouth daily.    [provider]    Allergies  Allergen Reactions  . Fentanyl Other (See Comments)    urine retention  . Penicillins Itching, Rash and Other (See Comments)    Has patient had a PCN reaction causing immediate rash, facial/tongue/throat swelling, SOB or lightheadedness with hypotension: Yes Has patient had a PCN reaction causing severe rash involving mucus membranes or skin necrosis: No Has patient had a PCN reaction that required hospitalization: No Has patient had a PCN reaction occurring within the last 10 years: Yes If all of the above answers are "NO", then may proceed with Cephalosporin use.    Family History  Problem Relation Age of Onset  . Cancer Mother        Stomach  . Cancer Father   . Diabetes Brother   . Cancer Brother        oral  . Healthy Son   . Healthy Sister     Social History Social History   Tobacco Use  . Smoking status: Former Smoker    Packs/day: 1.50    Years: 51.00    Pack years: 76.50    Types: Cigarettes    Quit date: 05/24/2004    Years since quitting: 14.6  . Smokeless tobacco: Never Used  . Tobacco comment: smoking cessation materials not required  Substance Use Topics  . Alcohol use: Not Currently  . Drug use: No    Review of Systems per patient and husband Constitutional: Negative for fever. Cardiovascular: Sudden onset left-sided chest pain approximately 3 hours ago. Respiratory: Positive for shortness of breath starting 3 hours ago, now resolved.  Cough x1 at home. Gastrointestinal: Negative for abdominal pain Musculoskeletal: Left leg phantom pain. Skin: Negative for skin complaints  Neurological: Negative for headache All other ROS negative  ____________________________________________   PHYSICAL EXAM:  VITAL SIGNS: ED Triage Vitals [01/03/19 1311]  Enc Vitals Group     BP 138/65     Pulse Rate (!) 111     Resp 16     Temp 98.4 F (36.9  C)     Temp Source Oral     SpO2 98 %     Weight      Height      Head Circumference      Peak Flow      Pain Score 7     Pain Loc      Pain Edu?  Excl. in [place]?    Constitutional: Alert. Well appearing and in no distress. Eyes: Normal exam ENT      Head: Normocephalic and atraumatic      Mouth/Throat: Mucous membranes are moist. Cardiovascular: Normal rate, regular rhythm. Respiratory: Normal respiratory effort without tachypnea nor retractions. Breath sounds are clear Gastrointestinal: Soft and nontender. No distention.   Musculoskeletal: Status post left AKA, well-appearing, nontender.  Warm. Neurologic:  Normal speech and language. No gross focal neurologic deficits Skin:  Skin is warm, dry and intact.  Psychiatric: Mood and affect are normal.   ____________________________________________    EKG  EKG viewed and interpreted by myself shows sinus tachycardia 116 bpm with a narrow QRS, normal axis, normal intervals, no concerning ST changes.  ____________________________________________    RADIOLOGY  Chest x-ray is negative  IMPRESSION:  1. Interval enlargement of the rounded irregular opacity seen at the  posterior right lower lung could be due to round atelectasis or  pneumonia. However given the interval enlargement underlying mass  cannot be excluded.  2. Stable bilateral pulmonary nodules as described above within the  left upper lobe and right middle lobe  3. Centrilobular emphysematous changes  4. Stable right paratracheal prominent lymph nodes.   ____________________________________________   INITIAL IMPRESSION / ASSESSMENT AND PLAN / ED COURSE  Pertinent labs & imaging results that were available during my care of the patient were reviewed by me and considered in my medical decision making (see chart for details).   Patient presents to the emergency department for left-sided chest pain and shortness of breath.  Husband states the patient coughed  and shortly after developed left-sided chest pain or shortness of breath, he checked her blood pressure and it was 160 systolic so they brought the patient to the emergency department for evaluation.  Here the patient appears well, she complains of left leg pain which she admits is chronic, takes hydrocodone and gabapentin for her chronic pain.  Patient continues state mild left lateral chest pain.  Denies any worsening with deep inspiration.  Differential would include ACS, pneumonia, pneumothorax, musculoskeletal/chest wall pain.  Patient's labs are thus far reassuring we will repeat a troponin as a precaution.  Given the acute onset of sharp left chest pain we obtain CT imaging of the chest to evaluate for occult fracture or pneumothorax.  CT shows enlargement of rounded irregular opacity in the right lung base.  I discussed this with the patient to follow-up with her doctor will likely need repeat imaging/further work-up to rule out malignancy.  No right-sided chest symptoms.  No right-sided chest pain.  No significant cough or fever.  We will discharge the patient home with PCP follow-up.  EARLINE STINER was evaluated in Emergency Department on 01/03/2019 for the symptoms described in the history of present illness. She was evaluated in the context of the global COVID-19 pandemic, which necessitated consideration that the patient might be at risk for infection with the SARS-CoV-2 virus that causes COVID-19. Institutional protocols and algorithms that pertain to the evaluation of patients at risk for COVID-19 are in a state of rapid change based on information released by regulatory bodies including the CDC and federal and state organizations. These policies and algorithms were followed during the patient's care in the ED.  ____________________________________________   FINAL CLINICAL IMPRESSION(S) / ED DIAGNOSES  Left chest pain   Harvest Dark, MD 01/03/19 1733

## 2019-01-03 NOTE — ED Notes (Signed)
C/o left sided mid abdominal and chest pain that started yesterday. Pt in NAD. Arrives on 2L [place] from home.

## 2019-01-03 NOTE — Telephone Encounter (Signed)
Telephone call to patient to schedule home visit with patient. SW spoke with patient's husband and he was in agreement with SW home visit on 01-04-19 at 1:00 PM.

## 2019-01-03 NOTE — ED Notes (Signed)
Pt alert and oriented X4, active, cooperative, pt in NAD. RR even and unlabored, color WNL.  Pt informed to return if any life threatening symptoms occur.  Left with home oxygen.

## 2019-01-03 NOTE — Discharge Instructions (Signed)
Your CT scan shows an enlarged opacity in the right lower lung which could possibly be due to a mass.  Please follow-up with your primary care doctor to discuss further work-up and/or repeat imaging.  Please return to the emergency department for any worsening chest pain, shortness of breath, or any other symptom personally concerning to yourself.

## 2019-01-04 ENCOUNTER — Ambulatory Visit: Payer: Self-pay

## 2019-01-04 ENCOUNTER — Other Ambulatory Visit: Payer: Medicare HMO

## 2019-01-04 DIAGNOSIS — Z515 Encounter for palliative care: Secondary | ICD-10-CM

## 2019-01-04 NOTE — Progress Notes (Addendum)
COMMUNITY PALLIATIVE CARE SW NOTE  PATIENT NAME: Amy [name] DOB: 1936-03-29 MRN: [id number]  PRIMARY CARE PROVIDER: [name], FNP  RESPONSIBLE PARTY:  Acct ID - Guarantor Home Phone Work Phone Relationship Acct Type  [id number] - [place],[phone]  Self P/F     [address], [place], [place] [address]     PLAN OF CARE and INTERVENTIONS:             1. GOALS OF CARE/ ADVANCE CARE PLANNING:  Patient is a DNR, form is in the home. Patient and [name]'s goal is to remain at home, as able. 2. SOCIAL/EMOTIONAL/SPIRITUAL ASSESSMENT/ INTERVENTIONS:  SW met with patient and [name] (patient's husband) in the home. Patient sitting up in her chair, acknowledged SW. [name] said patient went to ER yesterday due to "chest pain", unsure what it is was but patient was crying out in pain. Patient was assessed at ER and sent home. Patient reports pain on her bottom today. [name] offered to help patient lay down in her bed and adjust her position, but patient declined. Patient slept during most of visit. [name] said that patient sleeps about 16-hours in a 24-hour period. [name] said patient eats about two meals a day. Patient and [name] have been married for 33 years. Patient has one adult son in [place], but there is relationship strain. [name] said that patient's son and [name]'s sons will help if needed, but cannot come daily or stay for long periods. [name] said he does have a private aide coming in on Tuesdays from 8am-1pm and this has worked out well. [name] is considering increasing hours but would need to find another aide because this current aide is unable to increase her hours. SW encouraged [name] to talk with her and see if she has any other local contacts. [name] expressed concerns for care in the future as he is physically declining. SW also encouraged conversation with family about needs and concerns. [name] agreed. [name] said that he has talked with DSS about Medicaid and  that they would not qualify at this time.  3. PATIENT/CAREGIVER EDUCATION/ COPING:  Patient seemed calm, and [name] feels she is coping well. [name] expressed feeling "tired" and overwhelmed. SW encouraged breaks and time out of the home. [name] said he does leave when aide is there and he spends time out in his garden.  4. PERSONAL EMERGENCY PLAN:  Family will call 9-1-1 for emergencies. Due to COVID-19, patient is remaining at home other than doctor's appointment.  5. COMMUNITY RESOURCES COORDINATION/ HEALTH CARE NAVIGATION:  [name] manages patient care. Patient is scheduled for a Medicare Wellness visit tomorrow and follow-up with PCP. 6. FINANCIAL/LEGAL CONCERNS/INTERVENTIONS:  None indicated but concerns for the financial aspect of care long-term.     SOCIAL HX:  Social History   Tobacco Use  . Smoking status: Former Smoker    Packs/day: 1.50    Years: 51.00    Pack years: 76.50    Types: Cigarettes    Quit date: 05/24/2004    Years since quitting: 14.6  . Smokeless tobacco: Never Used  . Tobacco comment: smoking cessation materials not required  Substance Use Topics  . Alcohol use: Not Currently    CODE STATUS:   Code Status: Prior (DNR) ADVANCED DIRECTIVES: N MOST FORM COMPLETE:  No. HOSPICE EDUCATION PROVIDED: None.  PPS: Patient is dependent of most ADLs. Patient can feed herself. Patient uses wheelchair to transport.   I spent90 minutes with patient/family, from 1:00-2:30p providing education, support and consultation.  Margaretmary Lombard, LCSW

## 2019-01-05 ENCOUNTER — Encounter: Payer: Self-pay | Admitting: Nurse Practitioner

## 2019-01-05 ENCOUNTER — Ambulatory Visit (INDEPENDENT_AMBULATORY_CARE_PROVIDER_SITE_OTHER): Payer: Medicare HMO | Admitting: Nurse Practitioner

## 2019-01-05 ENCOUNTER — Ambulatory Visit (INDEPENDENT_AMBULATORY_CARE_PROVIDER_SITE_OTHER): Payer: Medicare HMO

## 2019-01-05 ENCOUNTER — Other Ambulatory Visit: Payer: Self-pay

## 2019-01-05 VITALS — BP 112/66 | HR 63 | Temp 97.3°F | Resp 16 | Ht 62.0 in | Wt 164.0 lb

## 2019-01-05 VITALS — BP 112/66 | HR 63 | Temp 97.3°F | Resp 16

## 2019-01-05 DIAGNOSIS — R918 Other nonspecific abnormal finding of lung field: Secondary | ICD-10-CM

## 2019-01-05 DIAGNOSIS — Z0000 Encounter for general adult medical examination without abnormal findings: Secondary | ICD-10-CM

## 2019-01-05 DIAGNOSIS — J189 Pneumonia, unspecified organism: Secondary | ICD-10-CM

## 2019-01-05 DIAGNOSIS — J181 Lobar pneumonia, unspecified organism: Secondary | ICD-10-CM | POA: Diagnosis not present

## 2019-01-05 DIAGNOSIS — J432 Centrilobular emphysema: Secondary | ICD-10-CM

## 2019-01-05 DIAGNOSIS — I700 Atherosclerosis of aorta: Secondary | ICD-10-CM

## 2019-01-05 MED ORDER — CEFDINIR 300 MG PO CAPS: 300.0000 mg | ORAL_CAPSULE | Freq: Every day | ORAL | 0 refills | Status: DC

## 2019-01-05 MED ORDER — AZITHROMYCIN 250 MG PO TABS: ORAL_TABLET | ORAL | 0 refills | Status: DC

## 2019-01-05 NOTE — Progress Notes (Signed)
Name: Amy [name]   MRN: [id number]    DOB: 01-30-1936   Date:01/05/2019       Progress Note  Subjective  Chief Complaint  Chief Complaint  Patient presents with  . Follow-up    ER mass on CT scan    HPI  Patient was seen at [facility] ER on 01/03/2019 due to chest wall pain, left-sided shortly after she ate her lunch.  And elevated blood pressure readings.  Troponin x2 normal CBC indicated improving hemoglobin, mildly elevated white blood cell count otherwise labs stable. CT chest impression:  1. Interval enlargement of the rounded irregular opacity seen at the posterior right lower lung could be due to round atelectasis or pneumonia. However given the interval enlargement underlying mass cannot be excluded. 2. Stable bilateral pulmonary nodules as described above within the left upper lobe and right middle lobe 3. Centrilobular emphysematous changes 4. Stable right paratracheal prominent lymph nodes.  States since hospitilzalion hasn't had chest pain, has had some shortness of breath. Is wearing oxygen, 93%/2L.  Has not seen pulmonologist since last year, has been states she is noncompliant with using daily inhalers but has DuoNeb at home which she feels comfortable using.  Patient states she will use it today.  Denies fevers, chills, nausea, vomiting.  Does endorse some chronic fatigue.  Husband but has been using a and D cream to vaginal ulcer, states has resolved.  Patient denies vaginal pain.  PHQ2/9: Depression screen [facility] 2/9 01/05/2019 12/22/2018 11/28/2018 09/21/2018 07/12/2018  Decreased Interest 1 0 0 0 0  Down, Depressed, Hopeless 1 1 0 1 1  PHQ - 2 Score 2 1 0 1 1  Altered sleeping 1 0 0 0 0  Tired, decreased energy 0 0 0 1 1  Change in appetite 1 0 0 0 0  Feeling bad or failure about yourself  0 0 0 0 0  Trouble concentrating 1 0 0 0 0  Moving slowly or fidgety/restless 1 0 0 0 0  Suicidal thoughts 0 0 0 0 0  PHQ-9 Score 6 1 0 2 2  Difficult doing work/chores Not  difficult at all Not difficult at all Not difficult at all Not difficult at all Not difficult at all  Some recent data might be hidden     PHQ reviewed. Positive  Patient Active Problem List   Diagnosis Date Noted  . Genital labial ulcer 12/06/2018  . B12 deficiency 11/05/2018  . Folate deficiency 11/05/2018  . Gastric cancer ([place])   . Stomach irritation   . Abnormal finding on GI tract imaging   . Obesity (BMI 30.0-34.9) 09/11/2018  . Abnormal gastrointestinal PET scan 08/27/2018  . Pulmonary nodules 08/27/2018  . Goals of care, counseling/discussion   . Palliative care by specialist   . Hypoxia 07/26/2018  . HCAP (healthcare-associated pneumonia) 07/02/2018  . Hypoxemia   . COPD exacerbation ([place])   . Renal cell carcinoma ([place])   . Lung nodule 04/23/2018  . Hypertensive renal disease 11/08/2017  . Secondary hyperparathyroidism of renal origin ([place]) 11/08/2017  . QT prolongation 10/27/2017  . Osteopenia 10/05/2017  . Choledocholithiasis 08/13/2017  . Chronic pain syndrome 04/05/2017  . Depression 11/15/2016  . Iron deficiency anemia 09/08/2016  . Anemia 06/29/2016  . Late onset Alzheimer's disease with behavioral disturbance ([place]) 06/14/2016  . S/P AKA (above knee amputation) unilateral, left ([place]) 06/08/2016  . Wheelchair dependent 03/11/2016  . 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 S/p nephrectomy 01/29/2015  .  Renal neoplasm 01/15/2015  . Abnormal EKG 01/02/2015  . Renal mass 12/05/2014  . GERD (gastroesophageal reflux disease) 11/20/2014  . GAD (generalized anxiety disorder) 11/20/2014  . AB (asthmatic bronchitis) 11/20/2014  . Aortic atherosclerosis ([place]) 11/20/2014  . Cataract 11/20/2014  . Leg pain 11/20/2014  . Continuous opioid dependence ([place]) 11/20/2014  . CKD (chronic kidney disease) stage 4, GFR 15-29 ml/min (HCC) 11/20/2014  . COPD (chronic obstructive pulmonary disease) ([place]) 11/20/2014  . Excessive falling 11/20/2014   . Fatty infiltration of liver 11/20/2014  . Hyperlipidemia 11/20/2014  . Anterior knee pain 11/20/2014  . LBP (low back pain) 11/20/2014  . Malaise and fatigue 11/20/2014  . Abnormal presence of protein in urine 11/20/2014  . PVD (peripheral vascular disease) ([place]) 11/20/2014  . Allergic rhinitis 11/20/2014  . At risk for falling 11/20/2014  . Phantom limb syndrome with pain ([place]) 11/05/2014  . Anxiety 11/05/2014  . Elevated serum creatinine 11/05/2014  . Diabetes mellitus type 2, controlled ([place]) 11/05/2014  . H/O adenomatous polyp of colon 07/27/2010    Past Medical History:  Diagnosis Date  . Anemia    low iron  . Anxiety   . Arthritis   . Bilateral cataracts   . Bronchitis    hx of   . Cataract   . Chronic kidney disease   . Depression   . Diabetes mellitus without complication ([place])   . 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 Emphysema of lung ([place])   . GERD (gastroesophageal reflux disease)   . Headache   . History of frequent urinary tract infections   . Hx of renal cell cancer    LEFT  . Hyperlipidemia   . Hypertension   . Numbness and tingling    right hand   . Obesity   . Osteopenia 10/05/2017  . Peripheral artery disease ([place])   . Urinary frequency   . Urinary incontinence     Past Surgical History:  Procedure Laterality Date  . ABDOMINAL HYSTERECTOMY  1978  . CARPAL TUNNEL RELEASE Bilateral   . CHOLECYSTECTOMY    . COLONOSCOPY    . COLONOSCOPY WITH ESOPHAGOGASTRODUODENOSCOPY (EGD)    . ERCP N/A 08/19/2017   Procedure: ENDOSCOPIC RETROGRADE CHOLANGIOPANCREATOGRAPHY (ERCP);  Surgeon: Arta Silence, MD;  Location: [facility] ENDOSCOPY;  Service: Endoscopy;  Laterality: N/A;  . ESOPHAGOGASTRODUODENOSCOPY (EGD) WITH PROPOFOL N/A 10/12/2018   Procedure: ESOPHAGOGASTRODUODENOSCOPY (EGD) WITH PROPOFOL;  Surgeon: Virgel Manifold, MD;  Location: [facility] ENDOSCOPY;  Service: Endoscopy;  Laterality: N/A;  . LEG  AMPUTATION     above the knee / left   . ROBOT ASSISTED LAPAROSCOPIC NEPHRECTOMY Left 01/15/2015   Procedure: ROBOTIC ASSISTED LAPAROSCOPIC RADICAL NEPHRECTOMY;  Surgeon: Alexis Frock, MD;  Location: [facility] ORS;  Service: Urology;  Laterality: Left;  . stent placement right leg       Social History   Tobacco Use  . Smoking status: Former Smoker    Packs/day: 1.50    Years: 51.00    Pack years: 76.50    Types: Cigarettes    Quit date: 05/24/2004    Years since quitting: 14.6  . Smokeless tobacco: Never Used  . Tobacco comment: smoking cessation materials not required  Substance Use Topics  . Alcohol use: Not Currently     Current Outpatient Medications:  .  acetaminophen (TYLENOL) 325 MG tablet, Take 325 mg by mouth every 6 (six) hours as needed., Disp: , Rfl:  .  albuterol (VENTOLIN HFA) 108 (90 Base) MCG/ACT inhaler, Inhale 1-2 puffs into the lungs every 4 (four) hours as needed. , Disp: , Rfl:  .  Ascorbic Acid (VITAMIN C) 1000 MG tablet, Take 1,000 mg by mouth daily., Disp: , Rfl:  .  atorvastatin (LIPITOR) 20 MG tablet, TAKE 1 TABLET EVERY NIGHT AT BEDTIME (Patient taking differently: Take 20 mg by mouth daily at 6 PM. ), Disp: 90 tablet, Rfl: 0 .  busPIRone (BUSPAR) 10 MG tablet, Take 10 mg by mouth 3 (three) times daily., Disp: , Rfl:  .  clopidogrel (PLAVIX) 75 MG tablet, TAKE 1 TABLET EVERY DAY (Patient taking differently: Take 75 mg by mouth daily. ), Disp: 90 tablet, Rfl: 3 .  donepezil (ARICEPT) 10 MG tablet, Take 1 tablet (10 mg total) by mouth at bedtime., Disp: 90 tablet, Rfl: 3 .  famotidine (PEPCID) 20 MG tablet, Take 20 mg by mouth daily. , Disp: , Rfl:  .  ferrous sulfate 325 (65 FE) MG tablet, Take 325 mg by mouth daily with breakfast. Pt taking every other day, Disp: , Rfl:  .  FLUoxetine (PROZAC) 10 MG capsule, Take 1 capsule (10 mg total) by mouth daily., Disp: 30 capsule, Rfl: 0 .  gabapentin (NEURONTIN) 300 MG capsule, Take 1 capsule (300 mg total) by mouth 3  (three) times daily., Disp: 270 capsule, Rfl: 3 .  HYDROcodone-acetaminophen (NORCO) 7.5-325 MG tablet, Take 1 tablet by mouth every 8 (eight) hours as needed for severe pain. Must last 30 days., Disp: 90 tablet, Rfl: 0 .  [START ON 02/02/2019] HYDROcodone-acetaminophen (NORCO) 7.5-325 MG tablet, Take 1 tablet by mouth every 8 (eight) hours as needed for severe pain. Must last 30 days. (Patient not taking: Reported on 01/05/2019), Disp: 90 tablet, Rfl: 0 .  ipratropium-albuterol (DUONEB) 0.5-2.5 (3) MG/3ML SOLN, INHALE THE CONTENTS OF 1 VIAL VIA NEBULIZER THREE TIMES DAILY AS DIRECTED (Patient taking differently: Inhale 3 mLs into the lungs 3 (three) times daily. ), Disp: 810 mL, Rfl: 2 .  pantoprazole (PROTONIX) 40 MG tablet, Take 1 tablet (40 mg total) by mouth daily., Disp: 90 tablet, Rfl: 0 .  polymixin-bacitracin (POLYSPORIN) 500-10000 UNIT/GM OINT ointment, Apply 1 application topically 2 (two) times daily. (Patient not taking: Reported on 01/05/2019), Disp: 14 g, Rfl: 2 .  QUEtiapine (SEROQUEL) 25 MG tablet, TAKE 1 TABLET EVERY MORNING, 1 TABLET EVERY AFTERNOON AND 2 TABLETS BEFORE BEDTIME (Patient taking differently: Take 25-50 mg by mouth 3 (three) times daily. ), Disp: 150 tablet, Rfl: 1 .  Thiamine HCl (VITAMIN B-1) 250 MG tablet, Take 250 mg by mouth daily., Disp: , Rfl:   Allergies  Allergen Reactions  . Fentanyl Other (See Comments)    urine retention  . Penicillins Itching, Rash and Other (See Comments)    Has patient had a PCN reaction causing immediate rash, facial/tongue/throat swelling, SOB or lightheadedness with hypotension: Yes Has patient had a PCN reaction causing severe rash involving mucus membranes or skin necrosis: No Has patient had a PCN reaction that required hospitalization: No Has patient had a PCN reaction occurring within the last 10 years: Yes If all of the above answers are "NO", then may proceed with Cephalosporin use.    ROS    No other specific complaints  in a complete review of systems (except as listed in HPI above).  Objective  Vitals:   01/05/19 1413  BP: 112/66  Pulse: 63  Resp: 16  Temp: (!) 97.3 F (36.3 C)  SpO2: 93%  There is no height or weight on file to calculate BMI.  Nursing Note and Vital Signs reviewed.  Physical Exam Constitutional:      Appearance: She is obese.  HENT:     Head: Normocephalic and atraumatic.     Nose: Rhinorrhea present.     Mouth/Throat:     Mouth: Mucous membranes are moist.     Pharynx: Oropharynx is clear.  Eyes:     Conjunctiva/sclera: Conjunctivae normal.  Cardiovascular:     Rate and Rhythm: Normal rate and regular rhythm.  Pulmonary:     Effort: Pulmonary effort is normal. No respiratory distress.     Breath sounds: Examination of the right-upper field reveals wheezing. Examination of the left-upper field reveals wheezing. Examination of the right-lower field reveals wheezing and rhonchi. Examination of the left-lower field reveals wheezing and rhonchi. Wheezing and rhonchi present.     Comments: On 2 L of oxygen [place] Skin:    General: Skin is warm and dry.  Neurological:     General: No focal deficit present.     Mental Status: She is alert and oriented to person, place, and time.  Psychiatric:        Mood and Affect: Mood normal.        Behavior: Behavior normal.      Results for orders placed or performed during the hospital encounter of 01/03/19 (from the past 48 hour(s))  Troponin I (High Sensitivity)     Status: None   Collection Time: 01/03/19  4:57 PM  Result Value Ref Range   Troponin I (High Sensitivity) 6 <18 ng/L    Comment: (NOTE) Elevated high sensitivity troponin I (hsTnI) values and significant  changes across serial measurements may suggest ACS but many other  chronic and acute conditions are known to elevate hsTnI results.  Refer to the "Links" section for chest pain algorithms and additional  guidance. Performed at [facility], [address] [place], [place] [address]     Assessment & Plan  1. Abnormal CT scan of lung - azithromycin (ZITHROMAX) 250 MG tablet; 2 tablets today and 1 tablet for the next 4 days  Dispense: 6 tablet; Refill: 0 - cefdinir (OMNICEF) 300 MG capsule; Take 1 capsule (300 mg total) by mouth daily.  Dispense: 7 capsule; Refill: 0  2. Aortic atherosclerosis ([place]) Seen on CT scan treated with statin. .  LDL goal under 70 achieved.  3. Centrilobular emphysema ([place]) Patient not compliant with daily medication use DuoNeb  4. Community acquired pneumonia of right lower lobe of lung (HCC) Abnormal chest CT, in combination with patient's shortness of breath will treat for pneumonia consider repeat CT scan in 1 month.  Appointment with pulmonology has been made for patient and updated husband. - azithromycin (ZITHROMAX) 250 MG tablet; 2 tablets today and 1 tablet for the next 4 days  Dispense: 6 tablet; Refill: 0 - cefdinir (OMNICEF) 300 MG capsule; Take 1 capsule (300 mg total) by mouth daily.  Dispense: 7 capsule; Refill: 0   -Red flags and when to present for emergency care or RTC including fever >101.37F, chest pain, shortness of breath, new/worsening/un-resolving symptoms,  reviewed with patient at time of visit. Follow up and care instructions discussed and provided in AVS.

## 2019-01-05 NOTE — Patient Instructions (Addendum)
Please start taking antibiotics today take azithromycin 2 tablets today with food and then 1 tablet daily for the next 4 days. It appears she has taken cefdinir in the past without issues last prescribed in February of this year.  Please take this daily with food for the next 7 days.  If you have any issues let us know immediately. Use breathing treatment every 6-12 hours as needed for shortness of breath If you develop worsening shortness of breath, fevers, chills, worsening chest pain please go to ER. Please follow-up with pulmonology. Please drink water, rest and eat healthy foods.

## 2019-01-05 NOTE — Progress Notes (Signed)
Subjective:   Amy [name] is a 83 y.o. female who presents for Medicare Annual (Subsequent) preventive examination.  Review of Systems:   Cardiac Risk Factors include: advanced age (>56men, >40 women);diabetes mellitus;dyslipidemia;obesity (BMI >30kg/m2);sedentary lifestyle     Objective:     Vitals: BP 112/66 (BP Location: Left Arm, Patient Position: Sitting, Cuff Size: Large)    Pulse 72    Temp (!) 97.3 F (36.3 C) (Temporal)    Resp 17    Ht 5\' 2"  (1.575 m)    Wt 164 lb (74.4 kg) Comment: patient reported. unable to get on scale   BMI 30.00 kg/m   Body mass index is 30 kg/m.  Advanced Directives 01/03/2019 11/20/2018 11/17/2018 11/09/2018 11/06/2018 10/27/2018 10/20/2018  Does Patient Have a Medical Advance Directive? No No Yes Yes No Yes Yes  Type of Advance Directive - - Living will Living will - Living will Living will  Does patient want to make changes to medical advance directive? - - No - Patient declined No - Patient declined No - Patient declined No - Patient declined -  Copy of [place] in [place]  Would patient like information on creating a medical advance directive? - - No - Patient declined - - No - Patient declined -  Some encounter information is confidential and restricted. Go to Review Flowsheets activity to see all data.    Tobacco Social History   Tobacco Use  Smoking Status Former Smoker   Packs/day: 1.50   Years: 51.00   Pack years: 76.50   Types: Cigarettes   Quit date: 05/24/2004   Years since quitting: 14.6  Smokeless Tobacco Never Used  Tobacco Comment   smoking cessation materials not required     Counseling given: Not Answered Comment: smoking cessation materials not required   Clinical Intake:  Pre-visit preparation completed: Yes  Pain : No/denies pain     BMI - recorded: 30 Nutritional Status: BMI > 30  Obese Nutritional Risks: None Diabetes: Yes(diet controlled) CBG done?: No Did pt.  bring in CBG monitor from home?: No   Nutrition Risk Assessment:  Has the patient had any N/V/D within the last 2 months?  No  Does the patient have any non-healing wounds?  No  Has the patient had any unintentional weight loss or weight gain?  No    Diabetes:  Is the patient diabetic?  Yes  If diabetic, was a CBG obtained today?  No  Did the patient bring in their glucometer from home?  No  How often do you monitor your CBG's? Pt does not check blood sugar, managed by diet.   Financial Strains and Diabetes Management:  Are you having any financial strains with the device, your supplies or your medication? No .  Does the patient want to be seen by Chronic Care Management for management of their diabetes?  Already enrolled in CCM Would the patient like to be referred to a Nutritionist or for Diabetic Management?  No   Diabetic Exams:  Diabetic Eye Exam: Completed 10/28/17 negative retinopathy. Overdue for diabetic eye exam. Appt postponed due to Covid-19.   Diabetic Foot Exam: Completed 07/12/18.    How often do you need to have someone help you when you read instructions, pamphlets, or other written materials from your doctor or pharmacy?: 1 - Never  Interpreter Needed?: No  Information entered by :: Clemetine Marker LPN  Past Medical History:  Diagnosis Date  Anemia    low iron   Anxiety    Arthritis    Bilateral cataracts    Bronchitis    hx of    Cataract    Chronic kidney disease    Depression    Diabetes mellitus without complication (HCC)    DVT (deep venous thrombosis) (HCC)    hx of in left leg currently has left AKA    Emphysema of lung (HCC)    GERD (gastroesophageal reflux disease)    Headache    History of frequent urinary tract infections    Hx of renal cell cancer    LEFT   Hyperlipidemia    Hypertension    Numbness and tingling    right hand    Obesity    Osteopenia 10/05/2017   Peripheral artery disease (HCC)    Urinary  frequency    Urinary incontinence    Past Surgical History:  Procedure Laterality Date   ABDOMINAL HYSTERECTOMY  1978   CARPAL TUNNEL RELEASE Bilateral    CHOLECYSTECTOMY     COLONOSCOPY     COLONOSCOPY WITH ESOPHAGOGASTRODUODENOSCOPY (EGD)     ERCP N/A 08/19/2017   Procedure: ENDOSCOPIC RETROGRADE CHOLANGIOPANCREATOGRAPHY (ERCP);  Surgeon: Arta Silence, MD;  Location: [facility] ENDOSCOPY;  Service: Endoscopy;  Laterality: N/A;   ESOPHAGOGASTRODUODENOSCOPY (EGD) WITH PROPOFOL N/A 10/12/2018   Procedure: ESOPHAGOGASTRODUODENOSCOPY (EGD) WITH PROPOFOL;  Surgeon: Virgel Manifold, MD;  Location: [facility] ENDOSCOPY;  Service: Endoscopy;  Laterality: N/A;   LEG AMPUTATION     above the knee / left    ROBOT ASSISTED LAPAROSCOPIC NEPHRECTOMY Left 01/15/2015   Procedure: ROBOTIC ASSISTED LAPAROSCOPIC RADICAL NEPHRECTOMY;  Surgeon: Alexis Frock, MD;  Location: [facility] ORS;  Service: Urology;  Laterality: Left;   stent placement right leg      Family History  Problem Relation Age of Onset   Cancer Mother        Stomach   Cancer Father    Diabetes Brother    Cancer Brother        oral   Healthy Son    Healthy Sister    Social History   Socioeconomic History   Marital status: Married    Spouse name: Amy [name]   Number of children: 1   Years of education: Not on file   Highest education level: 10th grade  Occupational History   Occupation: Retired  Scientist, product/process development strain: Not hard at [employer] insecurity    Worry: Never true    Inability: Never true   Transportation needs    Medical: No    Non-medical: No  Tobacco Use   Smoking status: Former Smoker    Packs/day: 1.50    Years: 51.00    Pack years: 76.50    Types: Cigarettes    Quit date: 05/24/2004    Years since quitting: 14.6   Smokeless tobacco: Never Used   Tobacco comment: smoking cessation materials not required  Substance and Sexual Activity   Alcohol use: Not Currently   Drug  use: No   Sexual activity: Not Currently  Lifestyle   Physical activity    Days per week: 0 days    Minutes per session: 0 min   Stress: Very much  Relationships   Social connections    Talks on phone: Patient refused    Gets together: Patient refused    Attends religious service: Never    Active member of club or organization: No    Attends meetings of  clubs or organizations: Never    Relationship status: Married  Other Topics Concern   Not on file  Social History Narrative   Not on file    Outpatient Encounter Medications as of 01/05/2019  Medication Sig   acetaminophen (TYLENOL) 325 MG tablet Take 325 mg by mouth every 6 (six) hours as needed.   albuterol (VENTOLIN HFA) 108 (90 Base) MCG/ACT inhaler Inhale 1-2 puffs into the lungs every 4 (four) hours as needed.    atorvastatin (LIPITOR) 20 MG tablet TAKE 1 TABLET EVERY NIGHT AT BEDTIME (Patient taking differently: Take 20 mg by mouth daily at 6 PM. )   busPIRone (BUSPAR) 10 MG tablet Take 10 mg by mouth 3 (three) times daily.   clopidogrel (PLAVIX) 75 MG tablet TAKE 1 TABLET EVERY DAY (Patient taking differently: Take 75 mg by mouth daily. )   donepezil (ARICEPT) 10 MG tablet Take 1 tablet (10 mg total) by mouth at bedtime.   famotidine (PEPCID) 20 MG tablet Take 20 mg by mouth daily.    ferrous sulfate 325 (65 FE) MG tablet Take 325 mg by mouth daily with breakfast. Pt taking every other day   FLUoxetine (PROZAC) 10 MG capsule Take 1 capsule (10 mg total) by mouth daily.   gabapentin (NEURONTIN) 300 MG capsule Take 1 capsule (300 mg total) by mouth 3 (three) times daily.   HYDROcodone-acetaminophen (NORCO) 7.5-325 MG tablet Take 1 tablet by mouth every 8 (eight) hours as needed for severe pain. Must last 30 days.   ipratropium-albuterol (DUONEB) 0.5-2.5 (3) MG/3ML SOLN INHALE THE CONTENTS OF 1 VIAL VIA NEBULIZER THREE TIMES DAILY AS DIRECTED (Patient taking differently: Inhale 3 mLs into the lungs 3 (three)  times daily. )   pantoprazole (PROTONIX) 40 MG tablet Take 1 tablet (40 mg total) by mouth daily.   QUEtiapine (SEROQUEL) 25 MG tablet TAKE 1 TABLET EVERY MORNING, 1 TABLET EVERY AFTERNOON AND 2 TABLETS BEFORE BEDTIME (Patient taking differently: Take 25-50 mg by mouth 3 (three) times daily. )   Thiamine HCl (VITAMIN B-1) 250 MG tablet Take 250 mg by mouth daily.   Ascorbic Acid (VITAMIN C) 1000 MG tablet Take 1,000 mg by mouth daily.   [START ON 02/02/2019] HYDROcodone-acetaminophen (NORCO) 7.5-325 MG tablet Take 1 tablet by mouth every 8 (eight) hours as needed for severe pain. Must last 30 days. (Patient not taking: Reported on 01/05/2019)   polymixin-bacitracin (POLYSPORIN) 500-10000 UNIT/GM OINT ointment Apply 1 application topically 2 (two) times daily. (Patient not taking: Reported on 01/05/2019)   [DISCONTINUED] ciprofloxacin (CIPRO) 250 MG tablet Take 250 mg by mouth 2 (two) times daily.    [DISCONTINUED] fluconazole (DIFLUCAN) 150 MG tablet Take 1 tablet (150 mg total) by mouth daily. Take 1 tablet (150 mg) by mouth. Three days later, take second tablet.   [DISCONTINUED] vitamin C (ASCORBIC ACID) 250 MG tablet Take 250 mg by mouth daily.   No facility-administered encounter medications on file as of 01/05/2019.     Activities of Daily Living In your present state of health, do you have any difficulty performing the following activities: 01/05/2019 12/22/2018  Hearing? Y N  Comment needs hearing aids replaced -  Vision? N N  Difficulty concentrating or making decisions? [name]  Walking or climbing stairs? Y Y  Dressing or bathing? Y Y  Doing errands, shopping? [name]  Preparing Food and eating ? Y -  Using the Toilet? Y -  In the past six months, have you accidently leaked urine? Y -  Do you have problems with loss of bowel control? Y -  Managing your Medications? Y -  Managing your Finances? Y -  Housekeeping or managing your Housekeeping? Y -  Some recent data might be hidden     Patient Care Team: [name], FNP as PCP - General (Family Medicine) Lequita Asal, MD as Referring Physician (Hematology and Oncology) Lang Snow, NP as Nurse Practitioner (Neurology) Birder Robson, MD as Referring Physician (Ophthalmology) Lucky Cowboy, Erskine Squibb, MD as Referring Physician (Vascular Surgery) Dagoberto Ligas, MD as Referring Physician (Pain Medicine) Lavonia Dana, MD as Consulting Physician (Nephrology) Virgel Manifold, MD as Consulting Physician (Gastroenterology) Yolonda Kida, MD as Consulting Physician (Cardiology) Tamala Julian Jonette Eva, NP as Nurse Practitioner (Hospice and Palliative Medicine) Manya Silvas, MD (Gastroenterology) Ok Edwards, NP as Nurse Practitioner (Gastroenterology) Benedetto Goad, RN as Registered Nurse Clent Jacks, RN as Oncology Nurse Navigator    Assessment:   This is a routine wellness examination for [place].  Exercise Activities and Dietary recommendations Current Exercise Habits: The patient does not participate in regular exercise at present, Exercise limited by: psychological condition(s);neurologic condition(s);respiratory conditions(s);orthopedic condition(s)  Goals     DIET - INCREASE WATER INTAKE     Recommend to drink at least 6-8 8oz glasses of water per day.     per husband "I just don't know how much longer I can care for her" (pt-stated)     Current Barriers:   Lacks caregiver support.   Dementia  AKA needing total care for toileting, bathing, transfer  Nurse Case Manager Clinical Goal(s):   Over the next 14 days, patients husband will engage with palliative care SW to discuss custodial care options  Interventions:   Active listening  Provided emotional support  Assessed for caregiver strain and additional family support  Colloborated with Palliative Care to ensure SW contact would be completed over the next 14 days  Patient Self Care  Activities:   Patient/caregiver will engage with palliative care   Please see past updates related to this goal by clicking on the "Past Updates" button in the selected goal          Fall Risk Fall Risk  01/05/2019 12/22/2018 12/01/2018 11/28/2018 09/21/2018  Falls in the past year? 1 1 1 1  0  Comment - - - - -  Number falls in past yr: 1 0 1 1 0  Injury with Fall? 1 1 1 1  0  Risk Factor Category  - - - - -  Risk for fall due to : History of fall(s);Impaired balance/gait;Impaired mobility - History of fall(s);Impaired mobility;Mental status change - Impaired balance/gait;Impaired mobility  Follow up Falls prevention discussed - - - Falls evaluation completed   FALL RISK PREVENTION PERTAINING TO THE HOME:  Any stairs in or around the home? Yes  If so, do they handrails? Yes   Home free of loose throw rugs in walkways, pet beds, electrical cords, etc? Yes  Adequate lighting in your home to reduce risk of falls? Yes   ASSISTIVE DEVICES UTILIZED TO PREVENT FALLS:  Life alert? No  Use of a cane, walker or w/c? Yes  Grab bars in the bathroom? Yes  Shower chair or bench in shower? Yes  Elevated toilet seat or a handicapped toilet? No   DME ORDERS:  DME order needed?  No   TIMED UP AND GO:  Was the test performed? No . Wheelchair bound  Education: Fall risk prevention has been discussed.  Intervention(s) required? No   Depression Screen PHQ 2/9 Scores 01/05/2019 12/22/2018 11/28/2018 09/21/2018  PHQ - 2 Score 2 1 0 1  PHQ- 9 Score 6 1 0 2  Exception Documentation - - - -  Not completed - - - -     Cognitive Function pt declined 6CIT for 2020 AWV     6CIT Screen 09/30/2017 08/11/2017 09/13/2016  What Year? 0 points 0 points 0 points  What month? 0 points 0 points 0 points  What time? 3 points 3 points 3 points  Count back from 20 0 points 0 points 0 points  Months in reverse 4 points 4 points 4 points  Repeat phrase 6 points 6 points 4 points  Total Score 13 13 11      Immunization History  Administered Date(s) Administered   Influenza, High Dose Seasonal PF 01/29/2015, 02/23/2016, 03/23/2017, 02/03/2018   Influenza, Seasonal, Injecte, Preservative Fre 02/03/2012   Influenza,inj,Quad PF,6+ Mos 01/29/2013, 02/11/2014   Influenza-Unspecified 02/11/2014, 03/07/2017   Pneumococcal Conjugate-13 04/25/2014   Pneumococcal Polysaccharide-23 05/24/2008   Zoster 11/19/2008    Qualifies for Shingles Vaccine? Yes  Zostavax completed 2010. Due for Shingrix. Education has been provided regarding the importance of this vaccine. Pt has been advised to call insurance company to determine out of pocket expense. Advised may also receive vaccine at local pharmacy or Health Dept. Verbalized acceptance and understanding.  Tdap: Although this vaccine is not a covered service during a Wellness Exam, does the patient still wish to receive this vaccine today?  No .  Education has been provided regarding the importance of this vaccine. Advised may receive this vaccine at local pharmacy or Health Dept. Aware to provide a copy of the vaccination record if obtained from local pharmacy or Health Dept. Verbalized acceptance and understanding.  Flu Vaccine: Up to date  Pneumococcal Vaccine: Up to date   Screening Tests Health Maintenance  Topic Date Due   OPHTHALMOLOGY EXAM  10/29/2018   INFLUENZA VACCINE  12/23/2018   URINE MICROALBUMIN  02/04/2019   TETANUS/TDAP  05/24/2026 (Originally 12/31/1954)   HEMOGLOBIN A1C  01/10/2019   FOOT EXAM  07/13/2019   DEXA SCAN  Completed   PNA vac Low Risk Adult  Completed   Cancer Screenings:  Colorectal Screening: Completed 09/26/13.  No longer required.  Mammogram: No longer required.  Bone Density: Completed 10/05/17. Results reflect OSTEOPENIA. Repeat every 2 years.   Lung Cancer Screening: (Low Dose CT Chest recommended if Age 66-80 years, 30 pack-year currently smoking OR have quit w/in 15years.) does not  qualify. Managed by oncology.    Additional Screening:  Hepatitis C Screening: no longer required  Vision Screening: Recommended annual ophthalmology exams for early detection of glaucoma and other disorders of the eye. Is the patient up to date with their annual eye exam?  No  Appt r/s due to Covid Who is the provider or what is the name of the office in which the pt attends annual eye exams? [place] Screening: Recommended annual dental exams for proper oral hygiene  Community Resource Referral:  CRR required this visit?  No      Plan:     I have personally reviewed and addressed the Medicare Annual Wellness questionnaire and have noted the following in the patients chart:  A. Medical and social history B. Use of alcohol, tobacco or illicit drugs  C. Current medications and supplements D. Functional ability and status E.  Nutritional status F.  Physical activity G.  Advance directives H. List of other physicians I.  Hospitalizations, surgeries, and ER visits in previous 12 months J.  [place] such as hearing and vision if needed, cognitive and depression L. Referrals and appointments   In addition, I have reviewed and discussed with patient certain preventive protocols, quality metrics, and best practice recommendations. A written personalized care plan for preventive services as well as general preventive health recommendations were provided to patient.   Signed,  Clemetine Marker, LPN Nurse Health Advisor   Nurse Notes: pt seen in ED this week for left side pain, Chest CT performed. Pt following up in office today with Benjamine Mola. Pt accompanied to visit by her husband, Amy [name] who expressed concerns about caring for her as she is depend on most ADL's.

## 2019-01-05 NOTE — Patient Instructions (Signed)
Amy [name] , Thank you for taking time to come for your Medicare Wellness Visit. I appreciate your ongoing commitment to your health goals. Please review the following plan we discussed and let me know if I can assist you in the future.   Screening recommendations/referrals: Colonoscopy: no longer required Mammogram: no longer required Bone Density: done 10/05/17 Recommended yearly ophthalmology/optometry visit for glaucoma screening and checkup Recommended yearly dental visit for hygiene and checkup  Vaccinations: Influenza vaccine: done 02/03/18 Pneumococcal vaccine: done 04/25/14 Tdap vaccine: due - please contact us if you get a cut or scrape Shingles vaccine: Shingrix discussed. Please contact your pharmacy for coverage information.   Conditions/risks identified: recommend drinking 6-8 glasses of water per day  Next appointment: Please follow up in one year for your Medicare Annual Wellness visit.     Preventive Care 7 Years and Older, Female Preventive care refers to lifestyle choices and visits with your health care provider that can promote health and wellness. What does preventive care include?  A yearly physical exam. This is also called an annual well check.  Dental exams once or twice a year.  Routine eye exams. Ask your health care provider how often you should have your eyes checked.  Personal lifestyle choices, including:  Daily care of your teeth and gums.  Regular physical activity.  Eating a healthy diet.  Avoiding tobacco and drug use.  Limiting alcohol use.  Practicing safe sex.  Taking low-dose aspirin every day.  Taking vitamin and mineral supplements as recommended by your health care provider. What happens during an annual well check? The services and screenings done by your health care provider during your annual well check will depend on your age, overall health, lifestyle risk factors, and family history of disease. Counseling  Your  health care provider may ask you questions about your:  Alcohol use.  Tobacco use.  Drug use.  Emotional well-being.  Home and relationship well-being.  Sexual activity.  Eating habits.  History of falls.  Memory and ability to understand (cognition).  Work and work Statistician.  Reproductive health. Screening  You may have the following tests or measurements:  Height, weight, and BMI.  Blood pressure.  Lipid and cholesterol levels. These may be checked every 5 years, or more frequently if you are over 13 years old.  Skin check.  Lung cancer screening. You may have this screening every year starting at age 73 if you have a 30-pack-year history of smoking and currently smoke or have quit within the past 15 years.  Fecal occult blood test (FOBT) of the stool. You may have this test every year starting at age 42.  Flexible sigmoidoscopy or colonoscopy. You may have a sigmoidoscopy every 5 years or a colonoscopy every 10 years starting at age 13.  Hepatitis C blood test.  Hepatitis B blood test.  Sexually transmitted disease (STD) testing.  Diabetes screening. This is done by checking your blood sugar (glucose) after you have not eaten for a while (fasting). You may have this done every 1-3 years.  Bone density scan. This is done to screen for osteoporosis. You may have this done starting at age 70.  Mammogram. This may be done every 1-2 years. Talk to your health care provider about how often you should have regular mammograms. Talk with your health care provider about your test results, treatment options, and if necessary, the need for more tests. Vaccines  Your health care provider may recommend certain vaccines, such as:  Influenza  vaccine. This is recommended every year.  Tetanus, diphtheria, and acellular pertussis (Tdap, Td) vaccine. You may need a Td booster every 10 years.  Zoster vaccine. You may need this after age 53.  Pneumococcal 13-valent  conjugate (PCV13) vaccine. One dose is recommended after age 8.  Pneumococcal polysaccharide (PPSV23) vaccine. One dose is recommended after age 48. Talk to your health care provider about which screenings and vaccines you need and how often you need them. This information is not intended to replace advice given to you by your health care provider. Make sure you discuss any questions you have with your health care provider. Document Released: 06/06/2015 Document Revised: 01/28/2016 Document Reviewed: 03/11/2015 Elsevier Interactive Patient Education  2017 [place] Prevention in the Home Falls can cause injuries. They can happen to people of all ages. There are many things you can do to make your home safe and to help prevent falls. What can I do on the outside of my home?  Regularly fix the edges of walkways and driveways and fix any cracks.  Remove anything that might make you trip as you walk through a door, such as a raised step or threshold.  Trim any bushes or trees on the path to your home.  Use bright outdoor lighting.  Clear any walking paths of anything that might make someone trip, such as rocks or tools.  Regularly check to see if handrails are loose or broken. Make sure that both sides of any steps have handrails.  Any raised decks and porches should have guardrails on the edges.  Have any leaves, snow, or ice cleared regularly.  Use sand or salt on walking paths during winter.  Clean up any spills in your garage right away. This includes oil or grease spills. What can I do in the bathroom?  Use night lights.  Install grab bars by the toilet and in the tub and shower. Do not use towel bars as grab bars.  Use non-skid mats or decals in the tub or shower.  If you need to sit down in the shower, use a plastic, non-slip stool.  Keep the floor dry. Clean up any water that spills on the floor as soon as it happens.  Remove soap buildup in the tub or  shower regularly.  Attach bath mats securely with double-sided non-slip rug tape.  Do not have throw rugs and other things on the floor that can make you trip. What can I do in the bedroom?  Use night lights.  Make sure that you have a light by your bed that is easy to reach.  Do not use any sheets or blankets that are too big for your bed. They should not hang down onto the floor.  Have a firm chair that has side arms. You can use this for support while you get dressed.  Do not have throw rugs and other things on the floor that can make you trip. What can I do in the kitchen?  Clean up any spills right away.  Avoid walking on wet floors.  Keep items that you use a lot in easy-to-reach places.  If you need to reach something above you, use a strong step stool that has a grab bar.  Keep electrical cords out of the way.  Do not use floor polish or wax that makes floors slippery. If you must use wax, use non-skid floor wax.  Do not have throw rugs and other things on the floor that can  make you trip. What can I do with my stairs?  Do not leave any items on the stairs.  Make sure that there are handrails on both sides of the stairs and use them. Fix handrails that are broken or loose. Make sure that handrails are as long as the stairways.  Check any carpeting to make sure that it is firmly attached to the stairs. Fix any carpet that is loose or worn.  Avoid having throw rugs at the top or bottom of the stairs. If you do have throw rugs, attach them to the floor with carpet tape.  Make sure that you have a light switch at the top of the stairs and the bottom of the stairs. If you do not have them, ask someone to add them for you. What else can I do to help prevent falls?  Wear shoes that:  Do not have high heels.  Have rubber bottoms.  Are comfortable and fit you well.  Are closed at the toe. Do not wear sandals.  If you use a stepladder:  Make sure that it is fully  opened. Do not climb a closed stepladder.  Make sure that both sides of the stepladder are locked into place.  Ask someone to hold it for you, if possible.  Clearly mark and make sure that you can see:  Any grab bars or handrails.  First and last steps.  Where the edge of each step is.  Use tools that help you move around (mobility aids) if they are needed. These include:  Canes.  Walkers.  Scooters.  Crutches.  Turn on the lights when you go into a dark area. Replace any light bulbs as soon as they burn out.  Set up your furniture so you have a clear path. Avoid moving your furniture around.  If any of your floors are uneven, fix them.  If there are any pets around you, be aware of where they are.  Review your medicines with your doctor. Some medicines can make you feel dizzy. This can increase your chance of falling. Ask your doctor what other things that you can do to help prevent falls. This information is not intended to replace advice given to you by your health care provider. Make sure you discuss any questions you have with your health care provider. Document Released: 03/06/2009 Document Revised: 10/16/2015 Document Reviewed: 06/14/2014 Elsevier Interactive Patient Education  2017 [employer].

## 2019-01-10 ENCOUNTER — Ambulatory Visit (INDEPENDENT_AMBULATORY_CARE_PROVIDER_SITE_OTHER): Payer: Medicare HMO

## 2019-01-10 ENCOUNTER — Other Ambulatory Visit: Payer: Self-pay

## 2019-01-10 DIAGNOSIS — Z89612 Acquired absence of left leg above knee: Secondary | ICD-10-CM

## 2019-01-10 DIAGNOSIS — G301 Alzheimer's disease with late onset: Secondary | ICD-10-CM | POA: Diagnosis not present

## 2019-01-10 DIAGNOSIS — F0281 Dementia in other diseases classified elsewhere with behavioral disturbance: Secondary | ICD-10-CM

## 2019-01-10 DIAGNOSIS — F02818 Dementia in other diseases classified elsewhere, unspecified severity, with other behavioral disturbance: Secondary | ICD-10-CM

## 2019-01-10 DIAGNOSIS — Z993 Dependence on wheelchair: Secondary | ICD-10-CM

## 2019-01-10 NOTE — Patient Instructions (Signed)
  Thank you allowing the Chronic Care Management Team to be a part of your care! It was a pleasure speaking with you today!  1. Please call CCM Team if additional needs questions or concerns arise.  CCM (Chronic Care Management) Team   Trish Fountain RN, BSN Nurse Care Coordinator  [phone]  Ruben Reason PharmD  Clinical Pharmacist  [phone]   Elliot Gurney, LCSW Clinical Social Worker [phone]  Goals Addressed            This Visit's Progress   . COMPLETED: per husband "I just don't know how much longer I can care for her" (pt-stated)       Current Barriers:  . Lacks caregiver support.  . Dementia . AKA needing total care for toileting, bathing, transfer  Nurse Case Manager Clinical Goal(s):  [name] Over the next 14 days, patients husband will engage with palliative care SW to discuss custodial care options-goal met 01/10/2019  Interventions:  . Active listening . Provided emotional support . Assessed for caregiver strain and additional family support . Colloborated with Palliative Care to ensure SW contact would be completed over the next 14 days  Patient Self Care Activities:  . Patient/caregiver will engage with palliative care   Please see past updates related to this goal by clicking on the "Past Updates" button in the selected goal          The patient verbalized understanding of instructions provided today and declined a print copy of patient instruction materials.   The patient has been provided with contact information for the care management team and has been advised to call with any health related questions or concerns.   SYMPTOMS OF A STROKE   You have any symptoms of stroke. "BE FAST" is an easy way to remember the main warning signs: ? B - Balance. Signs are dizziness, sudden trouble walking, or loss of balance. ? E - Eyes. Signs are trouble seeing or a sudden change in how you see. ? F - Face. Signs are sudden weakness or loss of  feeling of the face, or the face or eyelid drooping on one side. ? A - Arms. Signs are weakness or loss of feeling in an arm. This happens suddenly and usually on one side of the body. ? S - Speech. Signs are sudden trouble speaking, slurred speech, or trouble understanding what people say. ? T - Time. Time to call emergency services. Write down what time symptoms started.  You have other signs of stroke, such as: ? A sudden, very bad headache with no known cause. ? Feeling sick to your stomach (nausea). ? Throwing up (vomiting). ? Jerky movements you cannot control (seizure).  SYMPTOMS OF A HEART ATTACK  What are the signs or symptoms? Symptoms of this condition include:  Chest pain. It may feel like: ? Crushing or squeezing. ? Tightness, pressure, fullness, or heaviness.  Pain in the arm, neck, jaw, back, or upper body.  Shortness of breath.  Heartburn.  Indigestion.  Nausea.  Cold sweats.  Feeling tired.  Sudden lightheadedness.

## 2019-01-10 NOTE — Chronic Care Management (AMB) (Signed)
  Chronic Care Management   Follow Up Note   01/10/2019 Name: Amy [name] MRN: [id number] DOB: 05/12/1936  Referred by: [name], FNP Reason for referral : Chronic Care Management (follow up CT scan)   Subjective: "I'm doing good today"   Objective:  Assessment: Amy [name] is an 83 year old female patient of [facility] who was referred to chronic case management by her health plan. She has a history of but not limited to renal cell carcinoma, gastric carcinoma currently receiving palliative care, AKA (wheelchair/bed bound), COPD, and late onset Alzheimers. RN CM previously attempted initial health assessment but soon identified severe caregiver strain from husband.Contacted palliative care during last encounter to facilitate re-engagement with patient and family. Today I followed up with husband to assess for continued caregiver strain and to assess for palliative care SW involvement.  Review of patient status, including review of consultants reports, relevant laboratory and other test results, and collaboration with appropriate care team members and the patient's provider was performed as part of comprehensive patient evaluation and provision of chronic care management services.    Goals Addressed            This Visit's Progress   . COMPLETED: per husband "I just don't know how much longer I can care for her" (pt-stated)       Per notes Mr. Dauphin and patient engaged with palliative care LCSW 01/04/2019. LCSW encouraged Ms. Tirrell to solicit help from family including son. Mr. Weldon has yet to do this and has not discussed need for additional services with current caregiver to see if she has resources for additional private pay help. RN CM previously offered contact information for [facility] but husband refused stating he just could not pay for 24 hour care. Today he continues to complain about his health and the toll of  caring for his wife. He continues to state they are not able to receive Medicaid assistance secondary to their assets and worth. He is strongly encouraged to take to their children and ask for assistance.   Current Barriers:  . Lacks caregiver support.  . Dementia . AKA needing total care for toileting, bathing, transfer  Nurse Case Manager Clinical Goal(s):  [name] Over the next 14 days, patients husband will engage with palliative care SW to discuss custodial care options-goal met 01/10/2019  Interventions:  . Active listening . Provided emotional support . Assessed for caregiver strain and additional family support . Colloborated with Palliative Care to ensure SW contact would be completed over the next 14 days  Patient Self Care Activities:  . Patient/caregiver will engage with palliative care   Please see past updates related to this goal by clicking on the "Past Updates" button in the selected goal           The care management team will reach out to the patient again over the next 30 days.  The patient has been provided with contact information for the care management team and has been advised to call with any health related questions or concerns.      Kaliope Quinonez E. Rollene Rotunda, RN, BSN Nurse Care Coordinator [facility] Care Management  [phone]

## 2019-01-19 ENCOUNTER — Other Ambulatory Visit: Payer: Self-pay

## 2019-01-19 ENCOUNTER — Encounter: Payer: Self-pay | Admitting: Family Medicine

## 2019-01-19 ENCOUNTER — Ambulatory Visit (INDEPENDENT_AMBULATORY_CARE_PROVIDER_SITE_OTHER): Payer: Medicare HMO | Admitting: Family Medicine

## 2019-01-19 VITALS — BP 130/88 | HR 80 | Temp 97.8°F | Resp 16

## 2019-01-19 DIAGNOSIS — Z23 Encounter for immunization: Secondary | ICD-10-CM | POA: Diagnosis not present

## 2019-01-19 DIAGNOSIS — C772 Secondary and unspecified malignant neoplasm of intra-abdominal lymph nodes: Secondary | ICD-10-CM | POA: Diagnosis not present

## 2019-01-19 DIAGNOSIS — Z993 Dependence on wheelchair: Secondary | ICD-10-CM

## 2019-01-19 DIAGNOSIS — I739 Peripheral vascular disease, unspecified: Secondary | ICD-10-CM | POA: Diagnosis not present

## 2019-01-19 DIAGNOSIS — R918 Other nonspecific abnormal finding of lung field: Secondary | ICD-10-CM

## 2019-01-19 DIAGNOSIS — Z89612 Acquired absence of left leg above knee: Secondary | ICD-10-CM

## 2019-01-19 DIAGNOSIS — E782 Mixed hyperlipidemia: Secondary | ICD-10-CM

## 2019-01-19 DIAGNOSIS — J181 Lobar pneumonia, unspecified organism: Secondary | ICD-10-CM

## 2019-01-19 DIAGNOSIS — K219 Gastro-esophageal reflux disease without esophagitis: Secondary | ICD-10-CM

## 2019-01-19 DIAGNOSIS — F419 Anxiety disorder, unspecified: Secondary | ICD-10-CM

## 2019-01-19 DIAGNOSIS — F02818 Dementia in other diseases classified elsewhere, unspecified severity, with other behavioral disturbance: Secondary | ICD-10-CM

## 2019-01-19 DIAGNOSIS — J432 Centrilobular emphysema: Secondary | ICD-10-CM

## 2019-01-19 DIAGNOSIS — F3342 Major depressive disorder, recurrent, in full remission: Secondary | ICD-10-CM

## 2019-01-19 DIAGNOSIS — C162 Malignant neoplasm of body of stomach: Secondary | ICD-10-CM

## 2019-01-19 DIAGNOSIS — E1122 Type 2 diabetes mellitus with diabetic chronic kidney disease: Secondary | ICD-10-CM

## 2019-01-19 DIAGNOSIS — I700 Atherosclerosis of aorta: Secondary | ICD-10-CM

## 2019-01-19 DIAGNOSIS — E669 Obesity, unspecified: Secondary | ICD-10-CM

## 2019-01-19 DIAGNOSIS — N2581 Secondary hyperparathyroidism of renal origin: Secondary | ICD-10-CM | POA: Diagnosis not present

## 2019-01-19 DIAGNOSIS — N184 Chronic kidney disease, stage 4 (severe): Secondary | ICD-10-CM

## 2019-01-19 DIAGNOSIS — F0281 Dementia in other diseases classified elsewhere with behavioral disturbance: Secondary | ICD-10-CM

## 2019-01-19 DIAGNOSIS — G301 Alzheimer's disease with late onset: Secondary | ICD-10-CM

## 2019-01-19 DIAGNOSIS — J189 Pneumonia, unspecified organism: Secondary | ICD-10-CM

## 2019-01-19 DIAGNOSIS — G546 Phantom limb syndrome with pain: Secondary | ICD-10-CM

## 2019-01-19 LAB — POCT GLYCOSYLATED HEMOGLOBIN (HGB A1C): HbA1c, POC (prediabetic range): 5.8 % (ref 5.7–6.4)

## 2019-01-19 MED ORDER — ATORVASTATIN CALCIUM 20 MG PO TABS: 20.0000 mg | ORAL_TABLET | Freq: Every day | ORAL | 3 refills | Status: DC

## 2019-01-19 MED ORDER — FAMOTIDINE 20 MG PO TABS: 20.0000 mg | ORAL_TABLET | Freq: Every day | ORAL | 3 refills | Status: AC

## 2019-01-19 MED ORDER — QUETIAPINE FUMARATE 25 MG PO TABS: ORAL_TABLET | ORAL | 1 refills | Status: DC

## 2019-01-19 NOTE — Progress Notes (Signed)
Name: Amy [name]   MRN: [id number]    DOB: March 15, 1936   Date:01/19/2019       Progress Note  Subjective  Chief Complaint  Chief Complaint  Patient presents with  . Follow-up    HPI  Pt presents for follow up - was seeing prior PCP Dr. Sanda Klein.  Her husband is present to assist with the history.  Abnormal Chest CR/Chest Pain/Pneumonia: She has been to the ER since her last routine follow up, had ER follow up with Benjamine Mola NP-C in our office to review this and it was decided to treat for PNA.[name]  She took Azithromycin and Omnicef to treat for PNA.  Plan to repeat Ct scan in about 2 weeks from today to check for resolution.   Emphysema: On 2L [place] daily, no abnormal shortness of breath; being treated for PNA as above.  Seeing Dr. Ashby Dawes for care at this time.  Has nebulizer for duonebs and this works well for her.   Aortic Atherosclerosis/HLD/PVD: Taking statin therapy; taking plavix daily. Denies chest pain, shortness of breath, no signs or symptoms of bleeding.  Seeing Dr. Lucky Cowboy for PVD.  Also seeing Dr. Clayborn Bigness with Cardiology upcoming appt in October.  Anxiety/Depression: She has not been taking buspar, is taking seroquel TID and prozac daily. Patient and husband feel that her anxiety has been well controlled on seroquel alone.  We will not restart buspar at this time.  Phantom Pain of amputated area/LEFT AKA: wheelchair bound; taking gabapentin, occasionally uses norco for pain.  She sees Dr. Holley Raring for pain management.   Controlled DM/Obesity: She is eating whatever she wants right now; BG at home was 98 the other day - not checking regularly.  She is not on medication for DM at this time; on statin; not on ACE due to more advanced CKD.   CKD Stage 4 w/ Secondary hyperparathyroidism: Last kidney function 01/03/19 showed stability in stage 4 CKD. Seeing Dr. Abigail Butts - has upcoming appointment in October per husband.      Alzheimer's Dementia: Taking Aricept without issues;  husband is primary caregiver, though goals of care and additional help has been discussed in recent visits.  May consider placement or in home care in the future - does have 1 person come once weekly for about 5 hours.   Gastric Cancer: She is seeing Dr. Mike Gip as her medical oncologist, Dr. Baruch Gouty for Radiation oncologist.  She finished radiation and has upcoming appointment in October with Dr. Baruch Gouty for follow up.  Denies N/V, no abdominal pain or heartburn.     Patient Active Problem List   Diagnosis Date Noted  . Genital labial ulcer 12/06/2018  . B12 deficiency 11/05/2018  . Folate deficiency 11/05/2018  . Gastric cancer ([place])   . Stomach irritation   . Abnormal finding on GI tract imaging   . Obesity (BMI 30.0-34.9) 09/11/2018  . Abnormal gastrointestinal PET scan 08/27/2018  . Pulmonary nodules 08/27/2018  . Goals of care, counseling/discussion   . Palliative care by specialist   . Hypoxia 07/26/2018  . HCAP (healthcare-associated pneumonia) 07/02/2018  . Hypoxemia   . COPD exacerbation ([place])   . Renal cell carcinoma ([place])   . Lung nodule 04/23/2018  . Hypertensive renal disease 11/08/2017  . Secondary hyperparathyroidism of renal origin ([place]) 11/08/2017  . QT prolongation 10/27/2017  . Osteopenia 10/05/2017  . Choledocholithiasis 08/13/2017  . Chronic pain syndrome 04/05/2017  . Depression 11/15/2016  . Iron deficiency anemia 09/08/2016  . Anemia 06/29/2016  .  Late onset Alzheimer's disease with behavioral disturbance ([place]) 06/14/2016  . S/P AKA (above knee amputation) unilateral, left ([place]) 06/08/2016  . Wheelchair dependent 03/11/2016  . 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 ([place]) 03/13/2015  . S/p nephrectomy 01/29/2015  . Renal neoplasm 01/15/2015  . Abnormal EKG 01/02/2015  . Renal mass 12/05/2014  . GERD (gastroesophageal reflux disease) 11/20/2014  . GAD (generalized anxiety disorder) 11/20/2014  . AB (asthmatic bronchitis) 11/20/2014  .  Aortic atherosclerosis ([place]) 11/20/2014  . Cataract 11/20/2014  . Leg pain 11/20/2014  . Continuous opioid dependence ([place]) 11/20/2014  . CKD (chronic kidney disease) stage 4, GFR 15-29 ml/min (HCC) 11/20/2014  . COPD (chronic obstructive pulmonary disease) ([place]) 11/20/2014  . Excessive falling 11/20/2014  . Fatty infiltration of liver 11/20/2014  . Hyperlipidemia 11/20/2014  . Anterior knee pain 11/20/2014  . LBP (low back pain) 11/20/2014  . Malaise and fatigue 11/20/2014  . Abnormal presence of protein in urine 11/20/2014  . PVD (peripheral vascular disease) ([place]) 11/20/2014  . Allergic rhinitis 11/20/2014  . At risk for falling 11/20/2014  . Phantom limb syndrome with pain ([place]) 11/05/2014  . Anxiety 11/05/2014  . Elevated serum creatinine 11/05/2014  . Diabetes mellitus type 2, controlled ([place]) 11/05/2014  . H/O adenomatous polyp of colon 07/27/2010    Past Surgical History:  Procedure Laterality Date  . ABDOMINAL HYSTERECTOMY  1978  . CARPAL TUNNEL RELEASE Bilateral   . CHOLECYSTECTOMY    . COLONOSCOPY    . COLONOSCOPY WITH ESOPHAGOGASTRODUODENOSCOPY (EGD)    . ERCP N/A 08/19/2017   Procedure: ENDOSCOPIC RETROGRADE CHOLANGIOPANCREATOGRAPHY (ERCP);  Surgeon: Arta Silence, MD;  Location[facility] ENDOSCOPY;  Service: Endoscopy;  Laterality: N/A;  . ESOPHAGOGASTRODUODENOSCOPY (EGD) WITH PROPOFOL N/A 10/12/2018   Procedure: ESOPHAGOGASTRODUODENOSCOPY (EGD) WITH PROPOFOL;  Surgeon: Virgel Manifold, MD;  Location: [facility] ENDOSCOPY;  Service: Endoscopy;  Laterality: N/A;  . LEG AMPUTATION     above the knee / left   . ROBOT ASSISTED LAPAROSCOPIC NEPHRECTOMY Left 01/15/2015   Procedure: ROBOTIC ASSISTED LAPAROSCOPIC RADICAL NEPHRECTOMY;  Surgeon: Alexis Frock, MD;  Location: [facility] ORS;  Service: Urology;  Laterality: Left;  . stent placement right leg       Family History  Problem Relation Age of Onset  . Cancer Mother        Stomach  . Cancer Father   . Diabetes Brother   .  Cancer Brother        oral  . Healthy Son   . Healthy Sister     Social History   Socioeconomic History  . Marital status: Married    Spouse name: [name]  . Number of children: 1  . Years of education: Not on file  . Highest education level: 10th grade  Occupational History  . Occupation: Retired  Scientific laboratory technician  . Financial resource strain: Not hard at all  . Food insecurity    Worry: Never true    Inability: Never true  . Transportation needs    Medical: No    Non-medical: No  Tobacco Use  . Smoking status: Former Smoker    Packs/day: 1.50    Years: 51.00    Pack years: 76.50    Types: Cigarettes    Quit date: 05/24/2004    Years since quitting: 14.6  . Smokeless tobacco: Never Used  . Tobacco comment: smoking cessation materials not required  Substance and Sexual Activity  . Alcohol use: Not Currently  . Drug use: No  . Sexual activity: Not Currently  Lifestyle  . Physical activity    Days per week: 0 days    Minutes per session: 0 min  . Stress: Very much  Relationships  . Social Herbalist on phone: Patient refused    Gets together: Patient refused    Attends religious service: Never    Active member of club or organization: No    Attends meetings of clubs or organizations: Never    Relationship status: Married  . Intimate partner violence    Fear of current or ex partner: No    Emotionally abused: No    Physically abused: No    Forced sexual activity: No  Other Topics Concern  . Not on file  Social History Narrative  . Not on file     Current Outpatient Medications:  .  acetaminophen (TYLENOL) 325 MG tablet, Take 325 mg by mouth every 6 (six) hours as needed., Disp: , Rfl:  .  albuterol (VENTOLIN HFA) 108 (90 Base) MCG/ACT inhaler, Inhale 1-2 puffs into the lungs every 4 (four) hours as needed. , Disp: , Rfl:  .  Ascorbic Acid (VITAMIN C) 1000 MG tablet, Take 1,000 mg by mouth daily., Disp: , Rfl:  .  atorvastatin (LIPITOR) 20 MG tablet,  TAKE 1 TABLET EVERY NIGHT AT BEDTIME (Patient taking differently: Take 20 mg by mouth daily at 6 PM. ), Disp: 90 tablet, Rfl: 0 .  busPIRone (BUSPAR) 10 MG tablet, Take 10 mg by mouth 3 (three) times daily., Disp: , Rfl:  .  clopidogrel (PLAVIX) 75 MG tablet, TAKE 1 TABLET EVERY DAY (Patient taking differently: Take 75 mg by mouth daily. ), Disp: 90 tablet, Rfl: 3 .  donepezil (ARICEPT) 10 MG tablet, Take 1 tablet (10 mg total) by mouth at bedtime., Disp: 90 tablet, Rfl: 3 .  famotidine (PEPCID) 20 MG tablet, Take 20 mg by mouth daily. , Disp: , Rfl:  .  ferrous sulfate 325 (65 FE) MG tablet, Take 325 mg by mouth daily with breakfast. Pt taking every other day, Disp: , Rfl:  .  FLUoxetine (PROZAC) 10 MG capsule, Take 1 capsule (10 mg total) by mouth daily., Disp: 30 capsule, Rfl: 0 .  gabapentin (NEURONTIN) 300 MG capsule, Take 1 capsule (300 mg total) by mouth 3 (three) times daily., Disp: 270 capsule, Rfl: 3 .  HYDROcodone-acetaminophen (NORCO) 7.5-325 MG tablet, Take 1 tablet by mouth every 8 (eight) hours as needed for severe pain. Must last 30 days., Disp: 90 tablet, Rfl: 0 .  ipratropium-albuterol (DUONEB) 0.5-2.5 (3) MG/3ML SOLN, INHALE THE CONTENTS OF 1 VIAL VIA NEBULIZER THREE TIMES DAILY AS DIRECTED (Patient taking differently: Inhale 3 mLs into the lungs 3 (three) times daily. ), Disp: 810 mL, Rfl: 2 .  pantoprazole (PROTONIX) 40 MG tablet, Take 1 tablet (40 mg total) by mouth daily., Disp: 90 tablet, Rfl: 0 .  QUEtiapine (SEROQUEL) 25 MG tablet, TAKE 1 TABLET EVERY MORNING, 1 TABLET EVERY AFTERNOON AND 2 TABLETS BEFORE BEDTIME (Patient taking differently: Take 25-50 mg by mouth 3 (three) times daily. ), Disp: 150 tablet, Rfl: 1 .  Thiamine HCl (VITAMIN B-1) 250 MG tablet, Take 250 mg by mouth daily., Disp: , Rfl:  .  azithromycin (ZITHROMAX) 250 MG tablet, 2 tablets today and 1 tablet for the next 4 days (Patient not taking: Reported on 01/19/2019), Disp: 6 tablet, Rfl: 0 .  cefdinir  (OMNICEF) 300 MG capsule, Take 1 capsule (300 mg total) by mouth daily. (Patient not taking: Reported on 01/19/2019),  Disp: 7 capsule, Rfl: 0 .  [START ON 02/02/2019] HYDROcodone-acetaminophen (NORCO) 7.5-325 MG tablet, Take 1 tablet by mouth every 8 (eight) hours as needed for severe pain. Must last 30 days. (Patient not taking: Reported on 01/05/2019), Disp: 90 tablet, Rfl: 0 .  polymixin-bacitracin (POLYSPORIN) 500-10000 UNIT/GM OINT ointment, Apply 1 application topically 2 (two) times daily. (Patient not taking: Reported on 01/05/2019), Disp: 14 g, Rfl: 2  Allergies  Allergen Reactions  . Fentanyl Other (See Comments)    urine retention  . Penicillins Itching, Rash and Other (See Comments)    Has patient had a PCN reaction causing immediate rash, facial/tongue/throat swelling, SOB or lightheadedness with hypotension: Yes Has patient had a PCN reaction causing severe rash involving mucus membranes or skin necrosis: No Has patient had a PCN reaction that required hospitalization: No Has patient had a PCN reaction occurring within the last 10 years: Yes If all of the above answers are "NO", then may proceed with Cephalosporin use.    I personally reviewed active problem list, medication list, allergies, notes from last encounter, lab results with the patient/caregiver today.   ROS  Ten systems reviewed and is negative except as mentioned in HPI  Objective  Vitals:   01/19/19 1307  BP: 130/88  Pulse: 80  Resp: 16  Temp: 97.8 F (36.6 C)  TempSrc: Temporal  SpO2: 96%    There is no height or weight on file to calculate BMI.  Physical Exam Constitutional: Patient appears well-developed and well-nourished. No distress.  HENT: Head: Normocephalic and atraumatic.  Eyes: Conjunctivae and EOM are normal. No scleral icterus.  Neck: Normal range of motion. Neck supple. No JVD present. No thyromegaly present.  Cardiovascular: Normal rate, regular rhythm and normal heart sounds.  No  murmur heard. No BLE edema. Pulmonary/Chest: Effort normal and breath sounds normal. No respiratory distress. Musculoskeletal: Normal range of motion, no joint effusions. LEFT AKA present.  Wheelchair bound. Neurological: Pt is alert and oriented to person, place, and time. No cranial nerve deficit. Coordination, balance, strength, speech and gait are baseline for the patient.  Skin: Skin is warm and dry. No rash noted. No erythema.  Psychiatric: Patient has a normal mood and affect. behavior is normal. Judgment and thought content normal.  No results found for this or any previous visit (from the past 72 hour(s)).  PHQ2/9: Depression screen [facility] 2/9 01/19/2019 01/05/2019 12/22/2018 11/28/2018 09/21/2018  Decreased Interest 0 1 0 0 0  Down, Depressed, Hopeless 0 1 1 0 1  PHQ - 2 Score 0 2 1 0 1  Altered sleeping 0 1 0 0 0  Tired, decreased energy 0 0 0 0 1  Change in appetite 0 1 0 0 0  Feeling bad or failure about yourself  0 0 0 0 0  Trouble concentrating 0 1 0 0 0  Moving slowly or fidgety/restless 0 1 0 0 0  Suicidal thoughts 0 0 0 0 0  PHQ-9 Score 0 6 1 0 2  Difficult doing work/chores Not difficult at all Not difficult at all Not difficult at all Not difficult at all Not difficult at all  Some recent data might be hidden   PHQ-2/9 Result is negative.    Fall Risk: Fall Risk  01/19/2019 01/05/2019 12/22/2018 12/01/2018 11/28/2018  Falls in the past year? 1 1 1 1 1   Comment - - - - -  Number falls in past yr: 1 1 0 1 1  Injury with Fall? 1 1 1  1  1  Risk Factor Category  - - - - -  Risk for fall due to : History of fall(s);Impaired balance/gait;Impaired mobility;Impaired vision History of fall(s);Impaired balance/gait;Impaired mobility - History of fall(s);Impaired mobility;Mental status change -  Follow up - Falls prevention discussed - - -    Assessment & Plan  1. Aortic atherosclerosis (HCC) - atorvastatin (LIPITOR) 20 MG tablet; Take 1 tablet (20 mg total) by mouth daily at 6 PM.   Dispense: 90 tablet; Refill: 3  2. Centrilobular emphysema ([place]) - On [place] 2L, seeing pulmonology  3. Abnormal CT scan of lung - Seeing pulmonology; does need repeat CT Chest in 2 weeks to ensure resolution of abnormality after completing ABX  4. Community acquired pneumonia of right lower lobe of lung ([place]) - Seeing pulmonology for emphysema, PNA has been followed by our clinic; does need repeat CT Chest in 2 weeks to ensure resolution of abnormality after completing ABX  5. Cancer of intra-abdominal lymph nodes ([place]) - Continue with oncology  6. Secondary hyperparathyroidism of renal origin ([place]) - Continue with nephrology  7. PVD (peripheral vascular disease) ([place]) - Continue with vein and vascular  8. Mixed hyperlipidemia - Atorvastatin daily per orders.  9. Anxiety - will maintain current medication regimen at this time. - QUEtiapine (SEROQUEL) 25 MG tablet; TAKE 1 TABLET EVERY MORNING, 1 TABLET EVERY AFTERNOON AND 2 TABLETS BEFORE BEDTIME  Dispense: 150 tablet; Refill: 1  10. Recurrent major depressive disorder, in full remission ([place]) - Will maitnain current medication regimen; husband feels she is doing well at this time with depressive symptoms and patient agrees - QUEtiapine (SEROQUEL) 25 MG tablet; TAKE 1 TABLET EVERY MORNING, 1 TABLET EVERY AFTERNOON AND 2 TABLETS BEFORE BEDTIME  Dispense: 150 tablet; Refill: 1  11. 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 (HCC) - Taking gabapentin which does help some.  12. S/P AKA (above knee amputation) unilateral, left (HCC) - No recent falls; phantom pain still present but under better control  13. Wheelchair dependent - No recent falls  14. Obesity (BMI 30.0-34.9) - Recommend balanced diet, exercises as she is able and as tolerated - chair yoga/aerobics, arm exercises.  15. CKD (chronic kidney disease) stage 4, GFR 15-29 ml/min (HCC) - Seeing nephrology  16. Controlled type 2 diabetes mellitus with stage 4 chronic  kidney disease, without long-term current use of insulin (HCC) - Encouraged low carbohydrate diet - POCT glycosylated hemoglobin (Hb A1C)  17. Late onset Alzheimer's disease with behavioral disturbance ([place]) - Taking aricept and tolerating well  18. Malignant neoplasm of body of stomach ([place]) - Seeing oncology  19. Gastroesophageal reflux disease, esophagitis presence not specified - famotidine (PEPCID) 20 MG tablet; Take 1 tablet (20 mg total) by mouth daily.  Dispense: 90 tablet; Refill: 3

## 2019-01-23 DIAGNOSIS — F418 Other specified anxiety disorders: Secondary | ICD-10-CM | POA: Diagnosis not present

## 2019-01-23 DIAGNOSIS — G4719 Other hypersomnia: Secondary | ICD-10-CM | POA: Diagnosis not present

## 2019-01-23 DIAGNOSIS — F0151 Vascular dementia with behavioral disturbance: Secondary | ICD-10-CM | POA: Diagnosis not present

## 2019-01-23 DIAGNOSIS — Z23 Encounter for immunization: Secondary | ICD-10-CM | POA: Diagnosis not present

## 2019-01-23 DIAGNOSIS — G309 Alzheimer's disease, unspecified: Secondary | ICD-10-CM | POA: Diagnosis not present

## 2019-01-27 DIAGNOSIS — G546 Phantom limb syndrome with pain: Secondary | ICD-10-CM | POA: Diagnosis not present

## 2019-01-27 DIAGNOSIS — F0151 Vascular dementia with behavioral disturbance: Secondary | ICD-10-CM | POA: Diagnosis not present

## 2019-01-27 DIAGNOSIS — C169 Malignant neoplasm of stomach, unspecified: Secondary | ICD-10-CM | POA: Diagnosis not present

## 2019-01-27 DIAGNOSIS — F0281 Dementia in other diseases classified elsewhere with behavioral disturbance: Secondary | ICD-10-CM | POA: Diagnosis not present

## 2019-01-27 DIAGNOSIS — G309 Alzheimer's disease, unspecified: Secondary | ICD-10-CM | POA: Diagnosis not present

## 2019-01-27 DIAGNOSIS — F5104 Psychophysiologic insomnia: Secondary | ICD-10-CM | POA: Diagnosis not present

## 2019-01-27 DIAGNOSIS — F411 Generalized anxiety disorder: Secondary | ICD-10-CM | POA: Diagnosis not present

## 2019-01-27 DIAGNOSIS — N189 Chronic kidney disease, unspecified: Secondary | ICD-10-CM | POA: Diagnosis not present

## 2019-01-27 DIAGNOSIS — M1990 Unspecified osteoarthritis, unspecified site: Secondary | ICD-10-CM | POA: Diagnosis not present

## 2019-01-30 ENCOUNTER — Other Ambulatory Visit: Payer: Self-pay

## 2019-01-30 DIAGNOSIS — F411 Generalized anxiety disorder: Secondary | ICD-10-CM | POA: Diagnosis not present

## 2019-01-30 DIAGNOSIS — N189 Chronic kidney disease, unspecified: Secondary | ICD-10-CM | POA: Diagnosis not present

## 2019-01-30 DIAGNOSIS — C169 Malignant neoplasm of stomach, unspecified: Secondary | ICD-10-CM | POA: Diagnosis not present

## 2019-01-30 DIAGNOSIS — F5104 Psychophysiologic insomnia: Secondary | ICD-10-CM | POA: Diagnosis not present

## 2019-01-30 DIAGNOSIS — M1990 Unspecified osteoarthritis, unspecified site: Secondary | ICD-10-CM | POA: Diagnosis not present

## 2019-01-30 DIAGNOSIS — G309 Alzheimer's disease, unspecified: Secondary | ICD-10-CM | POA: Diagnosis not present

## 2019-01-30 DIAGNOSIS — F0281 Dementia in other diseases classified elsewhere with behavioral disturbance: Secondary | ICD-10-CM | POA: Diagnosis not present

## 2019-01-30 DIAGNOSIS — G546 Phantom limb syndrome with pain: Secondary | ICD-10-CM | POA: Diagnosis not present

## 2019-01-30 DIAGNOSIS — F0151 Vascular dementia with behavioral disturbance: Secondary | ICD-10-CM | POA: Diagnosis not present

## 2019-01-31 ENCOUNTER — Ambulatory Visit
Admission: RE | Admit: 2019-01-31 | Discharge: 2019-01-31 | Disposition: A | Discharge status: Home / Self Care | Payer: Medicare HMO | Source: Ambulatory Visit | Attending: Radiation Oncology | Admitting: Radiation Oncology

## 2019-01-31 ENCOUNTER — Encounter: Payer: Self-pay | Admitting: Radiation Oncology

## 2019-01-31 ENCOUNTER — Other Ambulatory Visit: Payer: Self-pay

## 2019-01-31 VITALS — BP 118/66 | HR 80 | Temp 97.6°F | Resp 18

## 2019-01-31 DIAGNOSIS — R918 Other nonspecific abnormal finding of lung field: Secondary | ICD-10-CM | POA: Diagnosis not present

## 2019-01-31 DIAGNOSIS — F0151 Vascular dementia with behavioral disturbance: Secondary | ICD-10-CM | POA: Diagnosis not present

## 2019-01-31 DIAGNOSIS — G546 Phantom limb syndrome with pain: Secondary | ICD-10-CM | POA: Diagnosis not present

## 2019-01-31 DIAGNOSIS — C162 Malignant neoplasm of body of stomach: Secondary | ICD-10-CM | POA: Diagnosis not present

## 2019-01-31 DIAGNOSIS — Z923 Personal history of irradiation: Secondary | ICD-10-CM | POA: Insufficient documentation

## 2019-01-31 DIAGNOSIS — F411 Generalized anxiety disorder: Secondary | ICD-10-CM | POA: Diagnosis not present

## 2019-01-31 DIAGNOSIS — J449 Chronic obstructive pulmonary disease, unspecified: Secondary | ICD-10-CM | POA: Diagnosis not present

## 2019-01-31 DIAGNOSIS — C169 Malignant neoplasm of stomach, unspecified: Secondary | ICD-10-CM | POA: Diagnosis not present

## 2019-01-31 DIAGNOSIS — Z85528 Personal history of other malignant neoplasm of kidney: Secondary | ICD-10-CM | POA: Insufficient documentation

## 2019-01-31 DIAGNOSIS — M1990 Unspecified osteoarthritis, unspecified site: Secondary | ICD-10-CM | POA: Diagnosis not present

## 2019-01-31 DIAGNOSIS — F0281 Dementia in other diseases classified elsewhere with behavioral disturbance: Secondary | ICD-10-CM | POA: Diagnosis not present

## 2019-01-31 DIAGNOSIS — G309 Alzheimer's disease, unspecified: Secondary | ICD-10-CM | POA: Diagnosis not present

## 2019-01-31 DIAGNOSIS — N189 Chronic kidney disease, unspecified: Secondary | ICD-10-CM | POA: Diagnosis not present

## 2019-01-31 DIAGNOSIS — F5104 Psychophysiologic insomnia: Secondary | ICD-10-CM | POA: Diagnosis not present

## 2019-01-31 NOTE — Progress Notes (Signed)
Radiation Oncology Follow up Note  Name: Amy [name]   Date:   01/31/2019 MRN:  [id number] DOB: 12/26/1935    This 83 y.o. female presents to the clinic today for 1 month follow-up status post radiation therapy for gastric adenocarcinoma.  REFERRING PROVIDER: [name], FNP  HPI: Patient is a 83 year old female now at 1 month having completed radiation therapy.  To a gastric adenocarcinoma showing up on PET CT scan is a hypermetabolic lesion just below the GE junction.  She does have history of renal cell carcinoma and has extensive comorbidities.  Endoscopy at [facility] showed a mass along the greater curvature staging this is a T2N0 lesion.  Mass was not amenable to surgical resection.  She was seen recently in the emergency room back in August for chest pain CT scan was performed showing large rounded irregular opacity seen at the posterior right lower lobe with underlying mass cannot be excluded.  She specifically denies nausea any dysphasia or bleeding per rectum.  COMPLICATIONS OF TREATMENT: none  FOLLOW UP COMPLIANCE: keeps appointments   PHYSICAL EXAM:  BP 118/66 (BP Location: Left Arm, Patient Position: Sitting)   Pulse 80   Temp 97.6 F (36.4 C) (Tympanic)   Resp 25  Elderly female wheelchair-bound status post left above-knee amputation well-developed well-nourished patient in NAD. HEENT reveals PERLA, EOMI, discs not visualized.  Oral cavity is clear. No oral mucosal lesions are identified. Neck is clear without evidence of cervical or supraclavicular adenopathy. Lungs are clear to A&P. Cardiac examination is essentially unremarkable with regular rate and rhythm without murmur rub or thrill. Abdomen is benign with no organomegaly or masses noted. Motor sensory and DTR levels are equal and symmetric in the upper and lower extremities. Cranial nerves II through XII are grossly intact. Proprioception is intact. No peripheral adenopathy or edema is identified. No motor or sensory  levels are noted. Crude visual fields are within normal range.  RADIOLOGY RESULTS: CT scans reviewed compatible with above-stated findings  PLAN: At this time I am referring the patient back to Dr. Mike Gip for continue patient of care.  Not sure what to make of the right lower lobe lung lesion.  We may just follow observe and repeat her CT scan in several months.  From a clinical standpoint she has done well.  I will see the patient back in 3 to 4 months.  Follow-up appointment with Dr. Mike Gip was arranged.  I would like to take this opportunity to thank you for allowing me to participate in the care of your patient.[name], MD

## 2019-02-01 DIAGNOSIS — N189 Chronic kidney disease, unspecified: Secondary | ICD-10-CM | POA: Diagnosis not present

## 2019-02-01 DIAGNOSIS — M1990 Unspecified osteoarthritis, unspecified site: Secondary | ICD-10-CM | POA: Diagnosis not present

## 2019-02-01 DIAGNOSIS — F5104 Psychophysiologic insomnia: Secondary | ICD-10-CM | POA: Diagnosis not present

## 2019-02-01 DIAGNOSIS — F0151 Vascular dementia with behavioral disturbance: Secondary | ICD-10-CM | POA: Diagnosis not present

## 2019-02-01 DIAGNOSIS — G546 Phantom limb syndrome with pain: Secondary | ICD-10-CM | POA: Diagnosis not present

## 2019-02-01 DIAGNOSIS — F411 Generalized anxiety disorder: Secondary | ICD-10-CM | POA: Diagnosis not present

## 2019-02-01 DIAGNOSIS — J9601 Acute respiratory failure with hypoxia: Secondary | ICD-10-CM | POA: Diagnosis not present

## 2019-02-01 DIAGNOSIS — F0281 Dementia in other diseases classified elsewhere with behavioral disturbance: Secondary | ICD-10-CM | POA: Diagnosis not present

## 2019-02-01 DIAGNOSIS — G309 Alzheimer's disease, unspecified: Secondary | ICD-10-CM | POA: Diagnosis not present

## 2019-02-01 DIAGNOSIS — C169 Malignant neoplasm of stomach, unspecified: Secondary | ICD-10-CM | POA: Diagnosis not present

## 2019-02-02 DIAGNOSIS — F411 Generalized anxiety disorder: Secondary | ICD-10-CM | POA: Diagnosis not present

## 2019-02-02 DIAGNOSIS — N189 Chronic kidney disease, unspecified: Secondary | ICD-10-CM | POA: Diagnosis not present

## 2019-02-02 DIAGNOSIS — F0281 Dementia in other diseases classified elsewhere with behavioral disturbance: Secondary | ICD-10-CM | POA: Diagnosis not present

## 2019-02-02 DIAGNOSIS — F0151 Vascular dementia with behavioral disturbance: Secondary | ICD-10-CM | POA: Diagnosis not present

## 2019-02-02 DIAGNOSIS — M1990 Unspecified osteoarthritis, unspecified site: Secondary | ICD-10-CM | POA: Diagnosis not present

## 2019-02-02 DIAGNOSIS — F5104 Psychophysiologic insomnia: Secondary | ICD-10-CM | POA: Diagnosis not present

## 2019-02-02 DIAGNOSIS — C169 Malignant neoplasm of stomach, unspecified: Secondary | ICD-10-CM | POA: Diagnosis not present

## 2019-02-02 DIAGNOSIS — G309 Alzheimer's disease, unspecified: Secondary | ICD-10-CM | POA: Diagnosis not present

## 2019-02-02 DIAGNOSIS — G546 Phantom limb syndrome with pain: Secondary | ICD-10-CM | POA: Diagnosis not present

## 2019-02-04 ENCOUNTER — Telehealth: Payer: Self-pay | Admitting: Family Medicine

## 2019-02-04 DIAGNOSIS — J189 Pneumonia, unspecified organism: Secondary | ICD-10-CM

## 2019-02-04 DIAGNOSIS — J432 Centrilobular emphysema: Secondary | ICD-10-CM

## 2019-02-04 DIAGNOSIS — R918 Other nonspecific abnormal finding of lung field: Secondary | ICD-10-CM

## 2019-02-04 NOTE — Telephone Encounter (Addendum)
-----   Message from [name], [place] sent at 01/19/2019  1:41 PM EDT ----- Regarding: Repeat CT Chest for PNA resolution Repeat CT Chest to follow up after abnormal 1 month ago/after antibiotic completion.  Please make sure this is scheduled and she/her husband has clear directions.

## 2019-02-05 ENCOUNTER — Telehealth: Payer: Self-pay | Admitting: Primary Care

## 2019-02-05 NOTE — Telephone Encounter (Signed)
T/c to patient to set up time to visit. Husband states they are getting home health at home now. Appointment set.

## 2019-02-06 NOTE — Progress Notes (Signed)
[facility]  [address], [address] [place], [place] [address] Phone: [phone]  Fax: [phone]   Clinic Day:  02/08/2019  Referring physician: [name], FNP  Chief Complaint: Amy [name] is a 83 y.o. female with clinical stage T2N0 gastric adenocarcinoma (2020), a history of stage III renal cell carcinoma (2016), and iron deficiency anemia who is seen for 2 month assessment after surgery and radiation oncology consult.   HPI: The patient was last seen in the medical oncology clinic on 11/06/2018. At that time, she denied any abdominal symptoms. Exam was stable. We discussed a referral to [facility] for surgical evaluation. We discussed a follow up EGD. Patient was started on oral iron.  She was seen by [facility].  She underwent EGD which showed a mass in the gastric cardia.  Biopsy was positive for adenocarcinoma.  EUS at [facility] on 10/27/2018 revealed a mass along the greater curvature in the proximal gastric body.  Clinical stage was T2N0.  Mass was not amendable to endoscopic removal.    Patient was seen by Dr Baruch Gouty on 11/09/2018.  She declined surgery.  Palliative radiation therapy to her gastric lesion delivering 4500 cGy in 25 fractions using 3-dimensional treatment planning as well as PET fusion study was recommended.    She received radiation from 11/22/2018 - 12/28/2018.  She was seen in the [facility] on 01/03/2019 with chest pain.  Chest CT revealed the interval enlargement of the rounded irregular opacity seen at the posterior right lower lung could be due to round atelectasis or pneumonia. However given the interval enlargement underlying mass cannot be excluded.  There was stable bilateral pulmonary nodules  within the left upper lobe and right middle lobe.  There was centrilobular emphysematous changes.  There was stable right paratracheal prominent lymph nodes  She was seen by Dr. Baruch Gouty on 01/31/2019.  She denied any nausea, dysphagia or  bleeding.  She was referred back to the [facility] for continued care. Repeat chest CT is scheduled for 02/15/2019.   During the interim, the patient felt "pretty good". She is on 2 liters of oxygen. Her husband reports that she has completed 2 antibiotics. She denies any flank pain.  Her mood is better. She does not have any insomnia.    Past Medical History:  Diagnosis Date   Anemia    low iron   Anxiety    Arthritis    Bilateral cataracts    Bronchitis    hx of    Cataract    Chronic kidney disease    Depression    Diabetes mellitus without complication (HCC)    DVT (deep venous thrombosis) (HCC)    hx of in left leg currently has left AKA    Emphysema of lung (HCC)    GERD (gastroesophageal reflux disease)    Headache    History of frequent urinary tract infections    Hx of renal cell cancer    LEFT   Hyperlipidemia    Hypertension    Numbness and tingling    right hand    Obesity    Osteopenia 10/05/2017   Peripheral artery disease (HCC)    Urinary frequency    Urinary incontinence     Past Surgical History:  Procedure Laterality Date   ABDOMINAL HYSTERECTOMY  1978   CARPAL TUNNEL RELEASE Bilateral    CHOLECYSTECTOMY     COLONOSCOPY     COLONOSCOPY WITH ESOPHAGOGASTRODUODENOSCOPY (EGD)     ERCP N/A 08/19/2017  Procedure: ENDOSCOPIC RETROGRADE CHOLANGIOPANCREATOGRAPHY (ERCP);  Surgeon: Arta Silence, MD;  Location: [facility] ENDOSCOPY;  Service: Endoscopy;  Laterality: N/A;   ESOPHAGOGASTRODUODENOSCOPY (EGD) WITH PROPOFOL N/A 10/12/2018   Procedure: ESOPHAGOGASTRODUODENOSCOPY (EGD) WITH PROPOFOL;  Surgeon: Virgel Manifold, MD;  Location: [facility] ENDOSCOPY;  Service: Endoscopy;  Laterality: N/A;   LEG AMPUTATION     above the knee / left    ROBOT ASSISTED LAPAROSCOPIC NEPHRECTOMY Left 01/15/2015   Procedure: ROBOTIC ASSISTED LAPAROSCOPIC RADICAL NEPHRECTOMY;  Surgeon: Alexis Frock, MD;  Location: [facility] ORS;  Service: Urology;   Laterality: Left;   stent placement right leg       Family History  Problem Relation Age of Onset   Cancer Mother        Stomach   Cancer Father    Diabetes Brother    Cancer Brother        oral   Healthy Son    Healthy Sister     Social History:  reports that she quit smoking about 14 years ago. Her smoking use included cigarettes. She has a 76.50 pack-year smoking history. She has never used smokeless tobacco. She reports previous alcohol use. She reports that she does not use drugs.  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 Herhusband's name isRaymond. She isaccompanied by her husbandvia [employer] and phone [phone]) today, who is her primary historian do to her dementia.  Allergies:  Allergies  Allergen Reactions   Fentanyl Other (See Comments)    urine retention   Penicillins Itching, Rash and Other (See Comments)    Has patient had a PCN reaction causing immediate rash, facial/tongue/throat swelling, SOB or lightheadedness with hypotension: Yes Has patient had a PCN reaction causing severe rash involving mucus membranes or skin necrosis: No Has patient had a PCN reaction that required hospitalization: No Has patient had a PCN reaction occurring within the last 10 years: Yes If all of the above answers are "NO", then may proceed with Cephalosporin use.    Current Medications: Current Outpatient Medications  Medication Sig Dispense Refill   acetaminophen (TYLENOL) 325 MG tablet Take 325 mg by mouth every 6 (six) hours as needed.     albuterol (VENTOLIN HFA) 108 (90 Base) MCG/ACT inhaler Inhale 1-2 puffs into the lungs every 4 (four) hours as needed.      Ascorbic Acid (VITAMIN C) 1000 MG tablet Take 1,000 mg by mouth daily.     atorvastatin (LIPITOR) 20 MG tablet Take 1 tablet (20 mg total) by mouth daily at 6 PM. 90 tablet 3   clopidogrel (PLAVIX) 75 MG tablet TAKE 1 TABLET EVERY DAY (Patient taking differently: Take 75 mg by mouth daily. ) 90 tablet 3    donepezil (ARICEPT) 10 MG tablet Take 1 tablet (10 mg total) by mouth at bedtime. 90 tablet 3   famotidine (PEPCID) 20 MG tablet Take 1 tablet (20 mg total) by mouth daily. 90 tablet 3   ferrous sulfate 325 (65 FE) MG tablet Take 325 mg by mouth daily with breakfast. Pt taking every other day     FLUoxetine (PROZAC) 10 MG capsule Take 1 capsule (10 mg total) by mouth daily. 30 capsule 0   gabapentin (NEURONTIN) 300 MG capsule Take 1 capsule (300 mg total) by mouth 3 (three) times daily. 270 capsule 3   HYDROcodone-acetaminophen (NORCO) 7.5-325 MG tablet Take 1 tablet by mouth every 8 (eight) hours as needed for severe pain. Must last 30 days. 90 tablet 0   ipratropium-albuterol (DUONEB) 0.5-2.5 (3) MG/3ML  SOLN INHALE THE CONTENTS OF 1 VIAL VIA NEBULIZER THREE TIMES DAILY AS DIRECTED (Patient taking differently: Inhale 3 mLs into the lungs 3 (three) times daily. ) 810 mL 2   pantoprazole (PROTONIX) 40 MG tablet Take 1 tablet (40 mg total) by mouth daily. 90 tablet 0   QUEtiapine (SEROQUEL) 25 MG tablet TAKE 1 TABLET EVERY MORNING, 1 TABLET EVERY AFTERNOON AND 2 TABLETS BEFORE BEDTIME (Patient taking differently: TAKE 25 mg  EVERY MORNING, take 75 mg  BEFORE BEDTIME) 150 tablet 1   Thiamine HCl (VITAMIN B-1) 250 MG tablet Take 250 mg by mouth daily.     No current facility-administered medications for this visit.     Review of Systems  Constitutional: Negative for chills, diaphoresis, fever, malaise/fatigue and weight loss.       Feels "pretty good".  HENT: Positive for hearing loss. Negative for congestion, ear discharge, nosebleeds, sinus pain, sore throat and tinnitus.   Eyes: Negative.  Negative for blurred vision and double vision.  Respiratory: Negative for cough (chronic), hemoptysis, sputum production and shortness of breath.        On 2 liter/min of oxygen.  Breathing "ok".  Cardiovascular: Negative.  Negative for chest pain, palpitations and claudication.  Gastrointestinal:  Negative.  Negative for abdominal pain, blood in stool, constipation, diarrhea, melena, nausea and vomiting.  Genitourinary: Negative.  Negative for dysuria, frequency, hematuria and urgency.  Musculoskeletal: Negative for back pain, joint pain, myalgias and neck pain.  Skin: Negative.  Negative for itching and rash.  Neurological: Positive for weakness (generalized). Negative for dizziness, tingling, sensory change, speech change, focal weakness and headaches.  Endo/Heme/Allergies: Negative.  Does not bruise/bleed easily.  Psychiatric/Behavioral: Positive for memory loss (dementia). Negative for depression and hallucinations. The patient is not nervous/anxious and does not have insomnia.   All other systems reviewed and are negative.  Performance status (ECOG): 3  Vitals Blood pressure 116/80, pulse 88, temperature (!) 95.5 F (35.3 C), temperature source Tympanic, resp. rate 18.  Physical Exam  Constitutional: She is oriented to person, place, and time. She appears well-developed and well-nourished.  Chronically ill appearing woman sitting comfortably in a  Wheelchair in no acute distress.  HENT:  Head: Normocephalic and atraumatic.  Mouth/Throat: Oropharynx is clear and moist. No oropharyngeal exudate.  Short dark gray hair.  Eyes: Pupils are equal, round, and reactive to light. Conjunctivae and EOM are normal. No scleral icterus.  Glasses. Brown eyes.  Neck: Normal range of motion. Neck supple. No JVD present.  Cardiovascular: Normal rate, regular rhythm and normal heart sounds.  No murmur heard. Pulmonary/Chest: Effort normal and breath sounds normal. No respiratory distress. She has no wheezes. She has no rales. She exhibits no tenderness.  Abdominal: Soft. Bowel sounds are normal. She exhibits no distension and no mass. There is no abdominal tenderness. There is no rebound and no guarding.  Musculoskeletal: Normal range of motion.        General: No tenderness or edema.      Comments: Left AKA.  Lymphadenopathy:    She has no cervical adenopathy.    She has no axillary adenopathy.       Right: No supraclavicular adenopathy present.       Left: No supraclavicular adenopathy present.  Neurological: She is alert and oriented to person, place, and time.  Dementia.  Skin: Skin is warm and dry. No rash noted. She is not diaphoretic. No erythema. No pallor.  Psychiatric: She has a normal mood and affect. Her  behavior is normal. Judgment and thought content normal. Cognition and memory are impaired.  Nursing note and vitals reviewed.   Imaging studies: 11/29/2014: Abdomen and pelvic CT revealed a 5.5 x 3.7 solid enhancing mass arising from the upper pole of the left kidney. There was evidence of renal vein invasion. There were borderline enlarged periaortic lymph node (9 mm).  08/16/2016: Chest, abdomen, and pelvic CTrevealed left nephrectomy without evidence of metastatic disease. There were low-attenuation lesions in the right kidney, likely cysts although definitive characterization is limited without post-contrast imaging. There was an ectatic abdominal aorta at risk for aneurysm development.  02/16/2017: Chest, abdomen and pelvic CTrevealed no evidence of recurrent disease. There was a 7 x 5 mm right upper lobe pulmonary nodule (previously 6 x 4 mm). There was a 3 mm (previously 1-2 mm) left lower lobe pulmonary nodule. 08/12/2017: Chest CT revealed no active pulmonary disease. Mild centrilobular emphysema. There was stable appearing pleural-based nodular opacity in the anterior aspect of the right upper lobe measuring 5.6 mm. There was stable 4 mm medial left upper lobe pleural-based opacity is unchanged.  08/12/2017: Abdomen and pelvic CTrevealed a 9 mm hyperdensity along the expected location of the distal common bile duct near the ampulla with dilatation of the CBD to 17 mm. Simple renal cysts of the right kidney were noted with a 1.1 cm hyperdense  complex appearing lesion possibly representing a hemorrhagic or proteinaceous cyst. No worrisome features were identified with respect of this complex lesion.  08/14/2017: MR abdomen MRCPrevealed a solitary 9 mm choledocholith in the mid to upper common bile duct. There was mild diffuse intrahepatic biliary ductal dilatation with dilated CBD (13 mm diameter). There was indeterminate small 1.2 cm renal cortical mass in the posterior lower right kidney, incompletely characterized on this noncontrast MRI study, renal neoplasm not excluded. Recommend attention on follow-up MRI (preferred) or CT abdomen without and with IV contrast in 6 months. There was ectatic 2.7 cm infrarenal abdominal aorta. Ectatic abdominal aorta at risk for aneurysm development. Recommend followup by ultrasound in 5 years. 02/13/2018: Chest, abdomen, and pelvic CTrevealed interval growth of 8 mm solid medial left lower lobe pulmonary nodule, cannot exclude enlarging pulmonary metastasis. This nodule was slightly below PET resolution. There was interval growth of 9 mm medial left upper lobe ground-glass pulmonary nodule. There were no additional potential findings of metastatic disease. There was no evidence of local tumor recurrence in the left nephrectomy bed. There was an ectatic 2.8 cm infrarenal abdominal aorta, stable at risk for aneurysm development.  05/26/2018: Chest CTrevealed further enlargement of part solid left upper lobe nodule, potentially adenocarcinoma. Left upper lobe measured 12 x 8 mm (previously 9 mm maximally). The left lower lobe solid nodule had not significantly changed over the last 3 months, although had enlarged over the last 10 months and could reflect a metastasis. There was new mild right paratracheal lymphadenopathy. Consider PET-CT for further evaluation. Alternatively, continued short-term CT follow-up could be performed. 08/24/2018: PET scanrevealed a2.5 cmpotential lesion in the stomach  just below the GE junction has a maximum SUV of 16.2. Therewasalso some accentuated activity in left upper quadrant jejunal loop. There werea variety of mildly enlarged lymph nodes, some with activity mildly above the background blood pool. Most of the pulmonary nodules hadlow-grade activity (benign versuslow-grade adenocarcinoma). The airspace opacity posteriorly in the aerated portion of the right lower lobe hadlow activity, probably reflecting rounded atelectasis. There wasa bandlike subpleural density in the superior segment right lower lobe whichwasmildly  hypermetabolic (SUV 3.1), but with a sub solid appearance and contour atypical for malignancy.There was a small right pleural effusionthat hadaccentuated activity suggesting possible exudative effusion. There was accentuated activity in the left eighth rib laterally(SUV 2.9), favorsmall healing nondisplaced rib fracture whichwas otherwise occult.   No visits with results within 3 Day(s) from this visit.  Latest known visit with results is:  Office Visit on 01/19/2019  Component Date Value Ref Range Status   HbA1c, POC (prediabetic range) 01/19/2019 5.8  5.7 - 6.4 % Final    Assessment:  Amy [name] is a 83 y.o. female witha history ofstage III renal cell carcinoma and T2N0gastric carcinoma.  She has a history of stage IIIrenal cell carcinomas/p robotic-assisted laparoscopic left radical nephrectomyon 01/09/2015. Pathology revealed a 6.3 cm renal cell carcinoma. Tumor was predominantly clear cell with a minor component of type I papillary. Fuhrman grade 3 of 4. Tumor extended into the renal vein. There was focal extension into the perirenal and sinus adipose tissue. Margins were negative. There was metastatic tumor in 2 of 2 lymph nodes. Pathologic stagewas T3aN1Mx.   PET scanon 08/24/2018 revealed a2.5 cmpotential lesion in the stomachjust below the GE junction has a maximum SUV of 16.2. There  wasalso some accentuated activity in left upper quadrant jejunal loop. There werea variety of mildly enlarged lymph nodes, some with activity mildly above the background blood pool. Most of the pulmonary noduleshadlow-grade activity (benign versuslow-grade adenocarcinoma). The airspace opacity posteriorly in the aerated portion of the right lower lobe hadlow activity, probably reflecting rounded atelectasis. There wasa bandlike subpleural density in the superior segment right lower lobe whichwasmildly hypermetabolic (SUV 3.1), but with a sub solid appearance and contour atypical for malignancy.There was a small right pleural effusionthat hadaccentuated activity suggesting possible exudative effusion. There was accentuated activity in theleft eighth riblaterally (SUV 2.9), favorsmall healing nondisplaced rib fracture whichwas otherwise occult.  EGDon 10/12/2018 revealed normal gastroesophageal junction and esophagus. There was a gastric tumorin the cardia. There was erythematous mucosa in the antrum.Examined duodenum was normal. Pathologyfrom the cardiarevealed adenocarcinoma, moderately differentiated intestinal type, at least intramucosal, and intestinal metaplasia in the stomach mass. There was also antral and oxyntic mucosa with mild chronic inactive gastritis, negative for H pylori, intestinal metaplasia, dysplasia, or malignancy.  EUS at [facility] on 10/27/2018 revealed a mass along the greater curvature of the proximal gastric body. Staging was T2N0.  The mass was not amenable to endoscopic removal.  Pathology revealed at least intramucosal adenocarcinoma arising in a background of intestinal metaplasia and low- and high-grade dysplasia.  She received palliative gastric radiation from 11/22/2018 - 12/28/2018.  Chest CT on 01/03/2019 revealed the interval enlargement of the rounded irregular opacity at the posterior right lower lung could be due to round atelectasis or  pneumonia. However given the interval enlargement underlying mass cannot be excluded.  There was stable bilateral pulmonary nodules  within the left upper lobe and right middle lobe.  There was centrilobular emphysematous changes.  There was stable right paratracheal prominent lymph nodes  She underwentERCPon 08/19/2017 for chloedocholithiasis. One pancreatic stent was placed into the ventral pancreatic duct. A filling defect consistent with a stone was seen on the cholangiogram. The entire main bile duct and common hepatic duct were moderately dilated. Choledocholithiasis was found. Complete removal was accomplished by biliary sphincterotomy and basket extraction.  She has iron deficiency anemia. Ferritin was 18 on 08/10/2016, 22 on 09/07/2016, 69 on 12/07/2016, 81 on 02/23/2017, 67 on 08/24/2017, 50 on 02/03/2018, 22 on  03/01/2018, and 25 on 05/31/2018.She denies pica. She is on oral iron.  She has chronic renal insufficiency. Creatinine was 1.86 (CrCl 26 ml/min) on 06/15/2016 and 1.59 on 10/27/2018.  CrCl is 26 ml/min.  She has a history of left lower extremity DVT. She is s/p left AKA.  Symptomatically, she denies any complaint.  Exam is stable.  Plan: 1.  Clinical T2N0 gastric carcinoma Review interval [facility] consultation and EGD.  Patient received palliative radiation (completed 12/28/2018).  Discuss plans for ongoing surveillance. 2. Stage III renal cell carcinoma Patient is s/p left radical nephrectomyon 01/09/2015. Continue surveillance. 3. Pulmonary nodules Review interval chest CT.  Imaging reviewed.  Etiology of posterior RLL opacity unclear.  Follow-up chest CT on 02/15/2019. 4.   B12 deficiency and folate deficiency             B12 was 197 (low) and folate was 5.5 (low) on 10/24/2018 ([facility] labs).              Anticipate follow-up at next visit. 5.   Iron deficiency             Hematocrit 36.3.  Hemoglobin 11.4.   MCV 85.4 on 01/03/2019.  Patient denies any bleeding.  Patient taking oral iron QOD.  Check iron stores at next visit. 6.   RTC on 02/22/2019 for MD assessment, labs (CBC with diff, CMP, ferritin, iron studies, B12, folate), and review of chest CT.  I discussed the assessment and treatment plan with the patient.  The patient was provided an opportunity to ask questions and all were answered.  The patient agreed with the plan and demonstrated an understanding of the instructions.  The patient was advised to call back if the symptoms worsen or if the condition fails to improve as anticipated.   Lequita Asal, MD, PhD    02/08/2019, 3:03 PM  I, Selena Batten, am acting as scribe for [employer]. Mike Gip, MD, PhD.  I, Yitzchak Kothari C. Mike Gip, MD, have reviewed the above documentation for accuracy and completeness, and I agree with the above.

## 2019-02-07 DIAGNOSIS — M1990 Unspecified osteoarthritis, unspecified site: Secondary | ICD-10-CM | POA: Diagnosis not present

## 2019-02-07 DIAGNOSIS — F0151 Vascular dementia with behavioral disturbance: Secondary | ICD-10-CM | POA: Diagnosis not present

## 2019-02-07 DIAGNOSIS — F0281 Dementia in other diseases classified elsewhere with behavioral disturbance: Secondary | ICD-10-CM | POA: Diagnosis not present

## 2019-02-07 DIAGNOSIS — F411 Generalized anxiety disorder: Secondary | ICD-10-CM | POA: Diagnosis not present

## 2019-02-07 DIAGNOSIS — G546 Phantom limb syndrome with pain: Secondary | ICD-10-CM | POA: Diagnosis not present

## 2019-02-07 DIAGNOSIS — F5104 Psychophysiologic insomnia: Secondary | ICD-10-CM | POA: Diagnosis not present

## 2019-02-07 DIAGNOSIS — G309 Alzheimer's disease, unspecified: Secondary | ICD-10-CM | POA: Diagnosis not present

## 2019-02-07 DIAGNOSIS — N189 Chronic kidney disease, unspecified: Secondary | ICD-10-CM | POA: Diagnosis not present

## 2019-02-07 DIAGNOSIS — C169 Malignant neoplasm of stomach, unspecified: Secondary | ICD-10-CM | POA: Diagnosis not present

## 2019-02-07 NOTE — Telephone Encounter (Signed)
[name] notified.

## 2019-02-08 ENCOUNTER — Other Ambulatory Visit: Payer: Self-pay

## 2019-02-08 ENCOUNTER — Encounter: Payer: Self-pay | Admitting: Hematology and Oncology

## 2019-02-08 ENCOUNTER — Inpatient Hospital Stay: Payer: Medicare HMO | Attending: Hematology and Oncology | Admitting: Hematology and Oncology

## 2019-02-08 ENCOUNTER — Other Ambulatory Visit: Payer: Medicare HMO | Admitting: Primary Care

## 2019-02-08 VITALS — BP 116/80 | HR 88 | Temp 95.5°F | Resp 18

## 2019-02-08 DIAGNOSIS — Z86718 Personal history of other venous thrombosis and embolism: Secondary | ICD-10-CM | POA: Diagnosis not present

## 2019-02-08 DIAGNOSIS — C779 Secondary and unspecified malignant neoplasm of lymph node, unspecified: Secondary | ICD-10-CM | POA: Diagnosis not present

## 2019-02-08 DIAGNOSIS — N189 Chronic kidney disease, unspecified: Secondary | ICD-10-CM | POA: Diagnosis not present

## 2019-02-08 DIAGNOSIS — R911 Solitary pulmonary nodule: Secondary | ICD-10-CM | POA: Insufficient documentation

## 2019-02-08 DIAGNOSIS — Z7189 Other specified counseling: Secondary | ICD-10-CM

## 2019-02-08 DIAGNOSIS — C162 Malignant neoplasm of body of stomach: Secondary | ICD-10-CM | POA: Diagnosis not present

## 2019-02-08 DIAGNOSIS — I129 Hypertensive chronic kidney disease with stage 1 through stage 4 chronic kidney disease, or unspecified chronic kidney disease: Secondary | ICD-10-CM | POA: Insufficient documentation

## 2019-02-08 DIAGNOSIS — E538 Deficiency of other specified B group vitamins: Secondary | ICD-10-CM | POA: Diagnosis not present

## 2019-02-08 DIAGNOSIS — E1122 Type 2 diabetes mellitus with diabetic chronic kidney disease: Secondary | ICD-10-CM | POA: Insufficient documentation

## 2019-02-08 DIAGNOSIS — D49519 Neoplasm of unspecified behavior of unspecified kidney: Secondary | ICD-10-CM | POA: Diagnosis not present

## 2019-02-08 DIAGNOSIS — Z85528 Personal history of other malignant neoplasm of kidney: Secondary | ICD-10-CM | POA: Diagnosis not present

## 2019-02-08 DIAGNOSIS — F17210 Nicotine dependence, cigarettes, uncomplicated: Secondary | ICD-10-CM | POA: Insufficient documentation

## 2019-02-08 DIAGNOSIS — M8580 Other specified disorders of bone density and structure, unspecified site: Secondary | ICD-10-CM | POA: Insufficient documentation

## 2019-02-08 DIAGNOSIS — D509 Iron deficiency anemia, unspecified: Secondary | ICD-10-CM | POA: Insufficient documentation

## 2019-02-08 DIAGNOSIS — C169 Malignant neoplasm of stomach, unspecified: Secondary | ICD-10-CM | POA: Diagnosis not present

## 2019-02-08 DIAGNOSIS — Z515 Encounter for palliative care: Secondary | ICD-10-CM

## 2019-02-08 NOTE — Progress Notes (Signed)
I contacted patient and she was asleep - so I spoke with patient's spouse. He was able to review patient's medication list and other information with me.  Patient's husband states that there are no new concerns at this time.

## 2019-02-08 NOTE — Progress Notes (Signed)
[facility] Palliative Care Consult Note Telephone: [phone]  Fax: [phone]  TELEHEALTH VISIT STATEMENT Due to the COVID-19 crisis, this visit was done via telemedicine from my office. It was initiated and consented to by this patient and/or family.  PATIENT NAME: Amy [name] DOB: 1936-05-24 MRN: [id number]  PRIMARY CARE PROVIDER:   [name], [place], [address] [place] [address] [phone]  REFERRING PROVIDER:  [name], [address] [place],  [place] [address] [phone]  RESPONSIBLE PARTY:   Extended Emergency Contact Information Primary Emergency Contact: [facility] Address: [address]          [place], [place] [address] Johnnette Litter of [place] Phone: [phone] Mobile Phone: [phone] Relation: Spouse Secondary Emergency Contact: [place], [place] Mobile Phone: [phone] Relation: Son   ASSESSMENT AND RECOMMENDATIONS:   1. Advance Care Planning/Goals of Care: Goals include to maximize quality of life and symptom management.MOST form given and options discussed. They will consider and we'll discuss next visit.  2. Symptom Management:   Sleep: Sleeps well, sleeps 16 hours a day. No report of agitation or night calling out. Often just wants to talk and keeps husband up.  Dyspnea: Uses 2 L oxygen all the time, DOE, has neb she uses prn  Mobility: Has PT now thru [facility]. Has liftchair. Has hard time transferring from chair to bed or toilet.Have a sliding board but chair has large wheel and it goes to the chair at an angle. Needs a transport wheel chair so they can use sliding board properly. Current wheel chair is  From 2007 but they have another one for the car that in only a few years old.  3. Family /Caregiver/Community Supports: Has an additional caregiver for bath on Fridays. This appears to be from home health.He still has one caregiver for 5 hours on Tuesday.  4.  Cognitive / Functional decline: Pressured speech. Able to converse but forgetful of details. States a lot of fatigue.Husband states increasingly difficult to transfer her due to her inability to support any weight for long.  5. Follow up Palliative Care Visit: Palliative care will continue to follow for goals of care clarification and symptom management. Return 4-6 weeks or prn.  I spent 60 minutes providing this consultation,  from 1030 to 1130. More than 50% of the time in this consultation was spent coordinating communication.   HISTORY OF PRESENT ILLNESS:  Amy [name] is a 83 y.o. year old female with multiple medical problems including dementia, incontinence, amputation, immobility, renal ca. Palliative Care was asked to follow this patient by consultation request of [name], FNP to help address advance care planning and goals of care. This is a follow up visit.  CODE STATUS: TBD, most left  PPS: 40% (weak) HOSPICE ELIGIBILITY/DIAGNOSIS: TBD  PAST MEDICAL HISTORY:  Past Medical History:  Diagnosis Date  . Anemia    low iron  . Anxiety   . Arthritis   . Bilateral cataracts   . Bronchitis    hx of   . Cataract   . Chronic kidney disease   . Depression   . Diabetes mellitus without complication ([place])   . 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 Emphysema of lung ([place])   . GERD (gastroesophageal reflux disease)   . Headache   . History of frequent urinary tract infections   . Hx of  renal cell cancer    LEFT  . Hyperlipidemia   . Hypertension   . Numbness and tingling    right hand   . Obesity   . Osteopenia 10/05/2017  . Peripheral artery disease ([place])   . Urinary frequency   . Urinary incontinence     SOCIAL HX:  Social History   Tobacco Use  . Smoking status: Former Smoker    Packs/day: 1.50    Years: 51.00    Pack years: 76.50    Types: Cigarettes    Quit date: 05/24/2004    Years since quitting: 14.7  .  Smokeless tobacco: Never Used  . Tobacco comment: smoking cessation materials not required  Substance Use Topics  . Alcohol use: Not Currently    ALLERGIES:  Allergies  Allergen Reactions  . Fentanyl Other (See Comments)    urine retention  . Penicillins Itching, Rash and Other (See Comments)    Has patient had a PCN reaction causing immediate rash, facial/tongue/throat swelling, SOB or lightheadedness with hypotension: Yes Has patient had a PCN reaction causing severe rash involving mucus membranes or skin necrosis: No Has patient had a PCN reaction that required hospitalization: No Has patient had a PCN reaction occurring within the last 10 years: Yes If all of the above answers are "NO", then may proceed with Cephalosporin use.     PERTINENT MEDICATIONS:  Outpatient Encounter Medications as of 02/08/2019  Medication Sig  . acetaminophen (TYLENOL) 325 MG tablet Take 325 mg by mouth every 6 (six) hours as needed.  [name] albuterol (VENTOLIN HFA) 108 (90 Base) MCG/ACT inhaler Inhale 1-2 puffs into the lungs every 4 (four) hours as needed.   . Ascorbic Acid (VITAMIN C) 1000 MG tablet Take 1,000 mg by mouth daily.  [name] atorvastatin (LIPITOR) 20 MG tablet Take 1 tablet (20 mg total) by mouth daily at 6 PM.  . clopidogrel (PLAVIX) 75 MG tablet TAKE 1 TABLET EVERY DAY (Patient taking differently: Take 75 mg by mouth daily. )  . donepezil (ARICEPT) 10 MG tablet Take 1 tablet (10 mg total) by mouth at bedtime.  . famotidine (PEPCID) 20 MG tablet Take 1 tablet (20 mg total) by mouth daily.  . ferrous sulfate 325 (65 FE) MG tablet Take 325 mg by mouth daily with breakfast. Pt taking every other day  . FLUoxetine (PROZAC) 10 MG capsule Take 1 capsule (10 mg total) by mouth daily.  [name] gabapentin (NEURONTIN) 300 MG capsule Take 1 capsule (300 mg total) by mouth 3 (three) times daily.  [name] HYDROcodone-acetaminophen (NORCO) 7.5-325 MG tablet Take 1 tablet by mouth every 8 (eight) hours as needed for severe  pain. Must last 30 days.  [name] ipratropium-albuterol (DUONEB) 0.5-2.5 (3) MG/3ML SOLN INHALE THE CONTENTS OF 1 VIAL VIA NEBULIZER THREE TIMES DAILY AS DIRECTED (Patient taking differently: Inhale 3 mLs into the lungs 3 (three) times daily. )  . pantoprazole (PROTONIX) 40 MG tablet Take 1 tablet (40 mg total) by mouth daily.  . QUEtiapine (SEROQUEL) 25 MG tablet TAKE 1 TABLET EVERY MORNING, 1 TABLET EVERY AFTERNOON AND 2 TABLETS BEFORE BEDTIME  . Thiamine HCl (VITAMIN B-1) 250 MG tablet Take 250 mg by mouth daily.   No facility-administered encounter medications on file as of 02/08/2019.     PHYSICAL EXAM / ROS:   Current and past weights:  154 lbs by report General: NAD, frail appearing,  Cardiovascular: no chest pain reported, no edema,  Pulmonary: no cough, no increased SOB, DOE, oxygen 2 L  Abdomen: appetite good, denies constipation, incontinent of bowel GU: denies dysuria, incontinent of urine MSK:  Amputation of leg, non ambulatory Skin: no rashes or wounds reported Neurological: Clearence Ped, cognitive impairment, sleep impairment.  Cyndia Skeeters DNP AGPCNP-BC  COVID-19 PATIENT SCREENING TOOL  Person answering questions: _____HUSBAND______________ _____   1.  Is the patient or any family member in the home showing any signs or symptoms regarding respiratory infection?               Person with Symptom- ______NA_____________________  a. Fever                                                                          Yes___ No___          ___________________  b. Shortness of breath                                                    Yes___ No___          ___________________ c. Cough/congestion                                       Yes___  No___         ___________________ d. Body aches/pains                                                         Yes___ No___        ____________________ e. Gastrointestinal symptoms (diarrhea, nausea)           Yes___ No___         ____________________  2. Within the past 14 days, has anyone living in the home had any contact with someone with or under investigation for COVID-19?    Yes___ No_X_   Person __________________

## 2019-02-09 DIAGNOSIS — F0281 Dementia in other diseases classified elsewhere with behavioral disturbance: Secondary | ICD-10-CM | POA: Diagnosis not present

## 2019-02-09 DIAGNOSIS — G546 Phantom limb syndrome with pain: Secondary | ICD-10-CM | POA: Diagnosis not present

## 2019-02-09 DIAGNOSIS — F0151 Vascular dementia with behavioral disturbance: Secondary | ICD-10-CM | POA: Diagnosis not present

## 2019-02-09 DIAGNOSIS — F411 Generalized anxiety disorder: Secondary | ICD-10-CM | POA: Diagnosis not present

## 2019-02-09 DIAGNOSIS — F5104 Psychophysiologic insomnia: Secondary | ICD-10-CM | POA: Diagnosis not present

## 2019-02-09 DIAGNOSIS — M1990 Unspecified osteoarthritis, unspecified site: Secondary | ICD-10-CM | POA: Diagnosis not present

## 2019-02-09 DIAGNOSIS — C169 Malignant neoplasm of stomach, unspecified: Secondary | ICD-10-CM | POA: Diagnosis not present

## 2019-02-09 DIAGNOSIS — N189 Chronic kidney disease, unspecified: Secondary | ICD-10-CM | POA: Diagnosis not present

## 2019-02-09 DIAGNOSIS — G309 Alzheimer's disease, unspecified: Secondary | ICD-10-CM | POA: Diagnosis not present

## 2019-02-14 DIAGNOSIS — M1990 Unspecified osteoarthritis, unspecified site: Secondary | ICD-10-CM | POA: Diagnosis not present

## 2019-02-14 DIAGNOSIS — F5104 Psychophysiologic insomnia: Secondary | ICD-10-CM | POA: Diagnosis not present

## 2019-02-14 DIAGNOSIS — N189 Chronic kidney disease, unspecified: Secondary | ICD-10-CM | POA: Diagnosis not present

## 2019-02-14 DIAGNOSIS — F0151 Vascular dementia with behavioral disturbance: Secondary | ICD-10-CM | POA: Diagnosis not present

## 2019-02-14 DIAGNOSIS — F411 Generalized anxiety disorder: Secondary | ICD-10-CM | POA: Diagnosis not present

## 2019-02-14 DIAGNOSIS — G309 Alzheimer's disease, unspecified: Secondary | ICD-10-CM | POA: Diagnosis not present

## 2019-02-14 DIAGNOSIS — C169 Malignant neoplasm of stomach, unspecified: Secondary | ICD-10-CM | POA: Diagnosis not present

## 2019-02-14 DIAGNOSIS — G546 Phantom limb syndrome with pain: Secondary | ICD-10-CM | POA: Diagnosis not present

## 2019-02-14 DIAGNOSIS — F0281 Dementia in other diseases classified elsewhere with behavioral disturbance: Secondary | ICD-10-CM | POA: Diagnosis not present

## 2019-02-15 ENCOUNTER — Ambulatory Visit
Admission: RE | Admit: 2019-02-15 | Discharge: 2019-02-15 | Disposition: A | Discharge status: Home / Self Care | Payer: Medicare HMO | Source: Ambulatory Visit | Attending: Family Medicine | Admitting: Family Medicine

## 2019-02-15 ENCOUNTER — Other Ambulatory Visit: Payer: Self-pay

## 2019-02-15 DIAGNOSIS — R918 Other nonspecific abnormal finding of lung field: Secondary | ICD-10-CM | POA: Insufficient documentation

## 2019-02-15 DIAGNOSIS — J432 Centrilobular emphysema: Secondary | ICD-10-CM | POA: Diagnosis not present

## 2019-02-15 DIAGNOSIS — J189 Pneumonia, unspecified organism: Secondary | ICD-10-CM

## 2019-02-15 DIAGNOSIS — C761 Malignant neoplasm of thorax: Secondary | ICD-10-CM | POA: Diagnosis not present

## 2019-02-15 DIAGNOSIS — J181 Lobar pneumonia, unspecified organism: Secondary | ICD-10-CM | POA: Insufficient documentation

## 2019-02-16 DIAGNOSIS — F5104 Psychophysiologic insomnia: Secondary | ICD-10-CM | POA: Diagnosis not present

## 2019-02-16 DIAGNOSIS — G309 Alzheimer's disease, unspecified: Secondary | ICD-10-CM | POA: Diagnosis not present

## 2019-02-16 DIAGNOSIS — F0151 Vascular dementia with behavioral disturbance: Secondary | ICD-10-CM | POA: Diagnosis not present

## 2019-02-16 DIAGNOSIS — M1990 Unspecified osteoarthritis, unspecified site: Secondary | ICD-10-CM | POA: Diagnosis not present

## 2019-02-16 DIAGNOSIS — F411 Generalized anxiety disorder: Secondary | ICD-10-CM | POA: Diagnosis not present

## 2019-02-16 DIAGNOSIS — N189 Chronic kidney disease, unspecified: Secondary | ICD-10-CM | POA: Diagnosis not present

## 2019-02-16 DIAGNOSIS — G546 Phantom limb syndrome with pain: Secondary | ICD-10-CM | POA: Diagnosis not present

## 2019-02-16 DIAGNOSIS — F0281 Dementia in other diseases classified elsewhere with behavioral disturbance: Secondary | ICD-10-CM | POA: Diagnosis not present

## 2019-02-16 DIAGNOSIS — C169 Malignant neoplasm of stomach, unspecified: Secondary | ICD-10-CM | POA: Diagnosis not present

## 2019-02-20 ENCOUNTER — Other Ambulatory Visit: Payer: Self-pay

## 2019-02-20 ENCOUNTER — Ambulatory Visit
Payer: Medicare HMO | Attending: Student in an Organized Health Care Education/Training Program | Admitting: Student in an Organized Health Care Education/Training Program

## 2019-02-20 ENCOUNTER — Encounter: Payer: Self-pay | Admitting: Student in an Organized Health Care Education/Training Program

## 2019-02-20 VITALS — BP 136/58 | HR 78 | Temp 98.3°F | Resp 18 | Ht 62.0 in | Wt 154.0 lb

## 2019-02-20 DIAGNOSIS — G894 Chronic pain syndrome: Secondary | ICD-10-CM | POA: Diagnosis not present

## 2019-02-20 DIAGNOSIS — Z89612 Acquired absence of left leg above knee: Secondary | ICD-10-CM

## 2019-02-20 DIAGNOSIS — N184 Chronic kidney disease, stage 4 (severe): Secondary | ICD-10-CM

## 2019-02-20 DIAGNOSIS — G546 Phantom limb syndrome with pain: Secondary | ICD-10-CM

## 2019-02-20 DIAGNOSIS — F1120 Opioid dependence, uncomplicated: Secondary | ICD-10-CM

## 2019-02-20 DIAGNOSIS — Z905 Acquired absence of kidney: Secondary | ICD-10-CM

## 2019-02-20 MED ORDER — HYDROCODONE-ACETAMINOPHEN 7.5-325 MG PO TABS: 1.0000 | ORAL_TABLET | Freq: Three times a day (TID) | ORAL | 0 refills | Status: DC | PRN

## 2019-02-20 MED ORDER — HYDROCODONE-ACETAMINOPHEN 7.5-325 MG PO TABS: 1.0000 | ORAL_TABLET | Freq: Three times a day (TID) | ORAL | 0 refills | Status: AC | PRN

## 2019-02-20 NOTE — Progress Notes (Signed)
[facility]  [address], [address] [place], [place] [address] Phone: [phone]  Fax: [phone]   Clinic Day:  02/22/2019  Referring physician: [name], FNP  Chief Complaint: Amy [name] is a 83 y.o. female with clinical stage T2N0 gastric adenocarcinoma (2020),a history of stage III renal cell carcinoma(2016), and iron deficiency anemia who is seen for 2 week assessment and review of interval chest CT.   HPI: The patient was last seen in the medical oncology clinic on 02/08/2019. At that time, she denied any complaint. Exam was stable.  The etiology of the posterior RLL opacity was unclear.  Follow-up chest CT was recommended.  Chest CT on 02/15/2019 revealed irregular masslike, pleural based opacity in the posterior right lower lobe without significant change from the most recent chest CT, but increased in size compared to the CT from 06/2018. Given the persistence, infection/inflammation was felt unlikely with neoplastic disease or rounded atelectasis the most likely etiologies. Recommend consultation with pulmonary medicine or cardiothoracic surgery if this had not been previously performed. There was an expansile lytic type bone lesion in the lateral left 8th rib new since 06/2018, concerning for metastatic disease.  There were lung nodules. Nodule in the left lower lobe, 9 mm (increased from 6 mm on the 02/13/2018 CT).  Sub solid nodule, medial left upper lobe, 9 mm (increased from 6 mm on 02/13/2018). Pleural base nodule anterior inferior right upper lobe 11 mm (stable).  There was moderate centrilobular emphysema, and coronary artery and aortic atherosclerotic calcifications.  There was prominent to mildly enlarged mediastinal lymph nodes were stable from the most recent prior exam and without significant change from 02/13/2018.  She was seen by Dr. Holley Raring for lower back pain on 02/20/2019.  Pain was felt best treated with a multidisciplinary  approach. Treatment would involve judicious use of pain medications and interventional procedures to decrease the pain, allowing the patient to participate in the physical activity that will ultimately produce long-lasting pain reductions. She was prescribed Norco 7.5-325 mg tablet every 8 hours prn.   She was seen by Dr. Patsey Berthold 02/21/2019. Her right lung nodule/mass had continued to grow and has developed more solid characteristics. Dr. Patsey Berthold did not recommend a bronchoscopy. She recommended  IF further work-up be pursued to obtain PET/CT particularly in view of other abnormalities. In Dr. Patsey Berthold opinion, the patient was high risk for invasive procedures of any kind due to her multiple comorbidities, general deconditioning and debility and marginal, at best, respiratory status.   During the interim, she has felt "nervous".  She feels everything is going well at home.  She wants to smoke. Her bowel movements are normal. She is sleeping more. Her memory is not improving.   She and her husband, Amy [name], are agreeable to a PET scan.  Her BP is 142/108 in the clinic today.     Past Medical History:  Diagnosis Date   Anemia    low iron   Anxiety    Arthritis    Bilateral cataracts    Bronchitis    hx of    Cataract    Chronic kidney disease    Depression    Diabetes mellitus without complication (HCC)    DVT (deep venous thrombosis) (HCC)    hx of in left leg currently has left AKA    Emphysema of lung (HCC)    GERD (gastroesophageal reflux disease)    Headache    History of frequent urinary tract infections  Hx of renal cell cancer    LEFT   Hyperlipidemia    Hypertension    Numbness and tingling    right hand    Obesity    Osteopenia 10/05/2017   Peripheral artery disease (HCC)    Urinary frequency    Urinary incontinence     Past Surgical History:  Procedure Laterality Date   ABDOMINAL HYSTERECTOMY  1978   CARPAL TUNNEL RELEASE Bilateral     CHOLECYSTECTOMY     COLONOSCOPY     COLONOSCOPY WITH ESOPHAGOGASTRODUODENOSCOPY (EGD)     ERCP N/A 08/19/2017   Procedure: ENDOSCOPIC RETROGRADE CHOLANGIOPANCREATOGRAPHY (ERCP);  Surgeon: Arta Silence, MD;  Location: [facility] ENDOSCOPY;  Service: Endoscopy;  Laterality: N/A;   ESOPHAGOGASTRODUODENOSCOPY (EGD) WITH PROPOFOL N/A 10/12/2018   Procedure: ESOPHAGOGASTRODUODENOSCOPY (EGD) WITH PROPOFOL;  Surgeon: Virgel Manifold, MD;  Location: [facility] ENDOSCOPY;  Service: Endoscopy;  Laterality: N/A;   LEG AMPUTATION     above the knee / left    ROBOT ASSISTED LAPAROSCOPIC NEPHRECTOMY Left 01/15/2015   Procedure: ROBOTIC ASSISTED LAPAROSCOPIC RADICAL NEPHRECTOMY;  Surgeon: Alexis Frock, MD;  Location: [facility] ORS;  Service: Urology;  Laterality: Left;   stent placement right leg       Family History  Problem Relation Age of Onset   Cancer Mother        Stomach   Cancer Father    Diabetes Brother    Cancer Brother        oral   Healthy Son    Healthy Sister     Social History:  reports that she quit smoking about 14 years ago. Her smoking use included cigarettes. She has a 76.50 pack-year smoking history. She has never used smokeless tobacco. She reports previous alcohol use. She reports that she does not use drugs. 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 Herhusband's name isRaymond. She isaccompanied by her husbandvia Ipadand phone [phone]) today, who is her primary historian do to her dementia.   Allergies:  Allergies  Allergen Reactions   Fentanyl Other (See Comments)    urine retention   Penicillins Itching, Rash and Other (See Comments)    Has patient had a PCN reaction causing immediate rash, facial/tongue/throat swelling, SOB or lightheadedness with hypotension: Yes Has patient had a PCN reaction causing severe rash involving mucus membranes or skin necrosis: No Has patient had a PCN reaction that required hospitalization: No Has patient had a PCN reaction  occurring within the last 10 years: Yes If all of the above answers are "NO", then may proceed with Cephalosporin use.    Current Medications: Current Outpatient Medications  Medication Sig Dispense Refill   acetaminophen (TYLENOL) 325 MG tablet Take 325 mg by mouth every 6 (six) hours as needed.     albuterol (VENTOLIN HFA) 108 (90 Base) MCG/ACT inhaler Inhale 1-2 puffs into the lungs every 4 (four) hours as needed.      Ascorbic Acid (VITAMIN C) 1000 MG tablet Take 1,000 mg by mouth daily.     atorvastatin (LIPITOR) 20 MG tablet Take 1 tablet (20 mg total) by mouth daily at 6 PM. 90 tablet 3   clopidogrel (PLAVIX) 75 MG tablet TAKE 1 TABLET EVERY DAY (Patient taking differently: Take 75 mg by mouth daily. ) 90 tablet 3   donepezil (ARICEPT) 10 MG tablet Take 1 tablet (10 mg total) by mouth at bedtime. 90 tablet 3   famotidine (PEPCID) 20 MG tablet Take 1 tablet (20 mg total) by mouth daily. 90 tablet 3  ferrous sulfate 325 (65 FE) MG tablet Take 325 mg by mouth daily with breakfast. Pt taking every other day     FLUoxetine (PROZAC) 10 MG capsule Take 1 capsule (10 mg total) by mouth daily. 30 capsule 0   gabapentin (NEURONTIN) 300 MG capsule Take 1 capsule (300 mg total) by mouth 3 (three) times daily. 270 capsule 3   [START ON 03/07/2019] HYDROcodone-acetaminophen (NORCO) 7.5-325 MG tablet Take 1 tablet by mouth every 8 (eight) hours as needed for severe pain. Must last 30 days. 90 tablet 0   ipratropium-albuterol (DUONEB) 0.5-2.5 (3) MG/3ML SOLN INHALE THE CONTENTS OF 1 VIAL VIA NEBULIZER THREE TIMES DAILY AS DIRECTED 810 mL 2   pantoprazole (PROTONIX) 40 MG tablet Take 1 tablet (40 mg total) by mouth daily. 90 tablet 0   QUEtiapine (SEROQUEL) 25 MG tablet TAKE 1 TABLET EVERY MORNING, 1 TABLET EVERY AFTERNOON AND 2 TABLETS BEFORE BEDTIME (Patient taking differently: TAKE 25 mg  EVERY MORNING, take 75 mg  BEFORE BEDTIME) 150 tablet 1   Thiamine HCl (VITAMIN B-1) 250 MG tablet  Take 250 mg by mouth daily.     No current facility-administered medications for this visit.     Review of Systems  Constitutional: Negative for chills, diaphoresis, fever, malaise/fatigue and weight loss.       Feels "nervous", but "pretty good".  HENT: Positive for hearing loss. Negative for congestion, ear discharge, nosebleeds, sinus pain, sore throat and tinnitus.   Eyes: Negative.  Negative for blurred vision and double vision.  Respiratory: Negative for cough (chronic), hemoptysis, sputum production and shortness of breath.        On 2 liter/min of oxygen.   Cardiovascular: Negative.  Negative for chest pain, palpitations and claudication.  Gastrointestinal: Negative.  Negative for abdominal pain, blood in stool, constipation, diarrhea, melena, nausea and vomiting.       Normal bowels.  Genitourinary: Negative.  Negative for dysuria, frequency, hematuria and urgency.  Musculoskeletal: Negative.  Negative for back pain, joint pain, myalgias and neck pain.  Skin: Negative.  Negative for itching and rash.  Neurological: Positive for weakness (generalized). Negative for dizziness, tingling, sensory change, speech change, focal weakness and headaches.  Endo/Heme/Allergies: Negative.  Does not bruise/bleed easily.  Psychiatric/Behavioral: Positive for memory loss (dementia). Negative for depression and hallucinations. The patient is nervous/anxious. The patient does not have insomnia.   All other systems reviewed and are negative.  Performance status (ECOG): 3  Vitals Blood pressure (!) 142/108, pulse 88, temperature 97.9 F (36.6 C), temperature source Tympanic, resp. rate 18, height 5\' 2"  (1.575 m), SpO2 97 %.  Physical Exam  Constitutional: She is oriented to person, place, and time. She appears well-developed and well-nourished.  Chronically ill appearing woman sitting comfortably in a wheelchair in no acute distress.  HENT:  Head: Normocephalic and atraumatic.  Short dark gray  hair.  Mask.  [place] in place.  Eyes: Conjunctivae and EOM are normal. No scleral icterus.  Glasses. Brown eyes.  Musculoskeletal:     Comments: Left AKA.  Neurological: She is alert and oriented to person, place, and time.  Dementia.  Skin: She is not diaphoretic. No pallor.  Psychiatric: She has a normal mood and affect. Her behavior is normal. Thought content normal. Cognition and memory are impaired.  Nursing note and vitals reviewed.   Imaging studies: 11/29/2014: Abdomen and pelvic CT revealed a 5.5 x 3.7 solid enhancing mass arising from the upper pole of the left kidney. There was evidence of  renal vein invasion. There were borderline enlarged periaortic lymph node (9 mm).  08/16/2016: Chest, abdomen, and pelvic CTrevealed left nephrectomy without evidence of metastatic disease. There were low-attenuation lesions in the right kidney, likely cysts although definitive characterization is limited without post-contrast imaging. There was an ectatic abdominal aorta at risk for aneurysm development.  02/16/2017: Chest, abdomen and pelvic CTrevealed no evidence of recurrent disease. There was a 7 x 5 mm right upper lobe pulmonary nodule (previously 6 x 4 mm). There was a 3 mm (previously 1-2 mm) left lower lobe pulmonary nodule. 08/12/2017: Chest CT revealed no active pulmonary disease. Mild centrilobular emphysema. There was stable appearing pleural-based nodular opacity in the anterior aspect of the right upper lobe measuring 5.6 mm. There was stable 4 mm medial left upper lobe pleural-based opacity is unchanged.  08/12/2017: Abdomen and pelvic CTrevealed a 9 mm hyperdensity along the expected location of the distal common bile duct near the ampulla with dilatation of the CBD to 17 mm. Simple renal cysts of the right kidney were noted with a 1.1 cm hyperdense complex appearing lesion possibly representing a hemorrhagic or proteinaceous cyst. No worrisome features were identified with  respect of this complex lesion.  08/14/2017: MR abdomen MRCPrevealed a solitary 9 mm choledocholith in the mid to upper common bile duct. There was mild diffuse intrahepatic biliary ductal dilatation with dilated CBD (13 mm diameter). There was indeterminate small 1.2 cm renal cortical mass in the posterior lower right kidney, incompletely characterized on this noncontrast MRI study, renal neoplasm not excluded. Recommend attention on follow-up MRI (preferred) or CT abdomen without and with IV contrast in 6 months. There was ectatic 2.7 cm infrarenal abdominal aorta. Ectatic abdominal aorta at risk for aneurysm development. Recommend followup by ultrasound in 5 years. 02/13/2018: Chest, abdomen, and pelvic CTrevealed interval growth of 8 mm solid medial left lower lobe pulmonary nodule, cannot exclude enlarging pulmonary metastasis. This nodule was slightly below PET resolution. There was interval growth of 9 mm medial left upper lobe ground-glass pulmonary nodule. There were no additional potential findings of metastatic disease. There was no evidence of local tumor recurrence in the left nephrectomy bed. There was an ectatic 2.8 cm infrarenal abdominal aorta, stable at risk for aneurysm development.  05/26/2018: Chest CTrevealed further enlargement of part solid left upper lobe nodule, potentially adenocarcinoma. Left upper lobe measured 12 x 8 mm (previously 9 mm maximally). The left lower lobe solid nodule had not significantly changed over the last 3 months, although had enlarged over the last 10 months and could reflect a metastasis. There was new mild right paratracheal lymphadenopathy. Consider PET-CT for further evaluation. Alternatively, continued short-term CT follow-up could be performed. 08/24/2018: PET scanrevealed a2.5 cmpotential lesion in the stomach just below the GE junction has a maximum SUV of 16.2. Therewasalso some accentuated activity in left upper quadrant jejunal  loop. There werea variety of mildly enlarged lymph nodes, some with activity mildly above the background blood pool. Most of the pulmonary nodules hadlow-grade activity (benign versuslow-grade adenocarcinoma). The airspace opacity posteriorly in the aerated portion of the right lower lobe hadlow activity, probably reflecting rounded atelectasis. There wasa bandlike subpleural density in the superior segment right lower lobe whichwasmildly hypermetabolic (SUV 3.1), but with a sub solid appearance and contour atypical for malignancy.There was a small right pleural effusionthat hadaccentuated activity suggesting possible exudative effusion. There was accentuated activity in the left 8th rib laterally(SUV 2.9), favorsmall healing nondisplaced rib fracture whichwas otherwise occult. 02/15/2019:  Chest CT  revealed irregular masslike, pleural based opacity in the posterior right lower lobe without significant change from the most recent chest CT, but increased in size compared to the CT from 06/2018. Given the persistence, infection/inflammation was felt unlikely with neoplastic disease or rounded atelectasis the most likely etiologies. Recommend consultation with pulmonary medicine or cardiothoracic surgery if this had not been previously performed. There was an expansile lytic type bone lesion in the lateral left 8th rib new since 06/2018, concerning for metastatic disease.  There were lung nodules. Nodule in the left lower lobe, 9 mm (increased from 6 mm on the 02/13/2018 CT).  Sub solid nodule, medial left upper lobe, 9 mm (increased from 6 mm on 02/13/2018). Pleural base nodule anterior inferior right upper lobe 11 mm (stable).  There was moderate centrilobular emphysema, and coronary artery and aortic atherosclerotic calcifications.  There was prominent to mildly enlarged mediastinal lymph nodes were stable from the most recent prior exam and without significant change from  02/13/2018.   Appointment on 02/22/2019  Component Date Value Ref Range Status   Sodium 02/22/2019 138  135 - 145 mmol/L Final   Potassium 02/22/2019 3.7  3.5 - 5.1 mmol/L Final   Chloride 02/22/2019 106  98 - 111 mmol/L Final   CO2 02/22/2019 24  22 - 32 mmol/L Final   Glucose, Bld 02/22/2019 151* 70 - 99 mg/dL Final   BUN 02/22/2019 27* 8 - 23 mg/dL Final   Creatinine, Ser 02/22/2019 2.17* 0.44 - 1.00 mg/dL Final   Calcium 02/22/2019 8.3* 8.9 - 10.3 mg/dL Final   Total Protein 02/22/2019 6.4* 6.5 - 8.1 g/dL Final   Albumin 02/22/2019 3.0* 3.5 - 5.0 g/dL Final   AST 02/22/2019 17  15 - 41 U/L Final   ALT 02/22/2019 10  0 - 44 U/L Final   Alkaline Phosphatase 02/22/2019 136* 38 - 126 U/L Final   Total Bilirubin 02/22/2019 0.6  0.3 - 1.2 mg/dL Final   GFR calc non Af Amer 02/22/2019 21* >60 mL/min Final   GFR calc Af Amer 02/22/2019 24* >60 mL/min Final   Anion gap 02/22/2019 8  5 - 15 Final   Performed at [facility], [address], [place], [place] [address]   WBC 02/22/2019 9.6  4.0 - 10.5 K/uL Final   RBC 02/22/2019 3.81* 3.87 - 5.11 MIL/uL Final   Hemoglobin 02/22/2019 10.4* 12.0 - 15.0 g/dL Final   HCT 02/22/2019 32.5* 36.0 - 46.0 % Final   MCV 02/22/2019 85.3  80.0 - 100.0 fL Final   MCH 02/22/2019 27.3  26.0 - 34.0 pg Final   MCHC 02/22/2019 32.0  30.0 - 36.0 g/dL Final   RDW 02/22/2019 18.1* 11.5 - 15.5 % Final   Platelets 02/22/2019 225  150 - 400 K/uL Final   nRBC 02/22/2019 0.0  0.0 - 0.2 % Final   Neutrophils Relative % 02/22/2019 67  % Final   Neutro Abs 02/22/2019 6.5  1.7 - 7.7 K/uL Final   Lymphocytes Relative 02/22/2019 19  % Final   Lymphs Abs 02/22/2019 1.8  0.7 - 4.0 K/uL Final   Monocytes Relative 02/22/2019 8  % Final   Monocytes Absolute 02/22/2019 0.7  0.1 - 1.0 K/uL Final   Eosinophils Relative 02/22/2019 4  % Final   Eosinophils Absolute 02/22/2019 0.4  0.0 - 0.5 K/uL Final   Basophils Relative  02/22/2019 1  % Final   Basophils Absolute 02/22/2019 0.1  0.0 - 0.1 K/uL Final   Immature Granulocytes  02/22/2019 1  % Final   Abs Immature Granulocytes 02/22/2019 0.05  0.00 - 0.07 K/uL Final   Performed at [facility], [address], [place], [place] [address]    Assessment:  Amy [name] is a 83 y.o. female witha history ofstage III renal cell carcinoma and T2N0gastric carcinoma.  She has a history of stage IIIrenal cell carcinomas/p robotic-assisted laparoscopic left radical nephrectomyon 01/09/2015. Pathology revealed a 6.3 cm renal cell carcinoma. Tumor was predominantly clear cell with a minor component of type I papillary. Fuhrman grade 3 of 4. Tumor extended into the renal vein. There was focal extension into the perirenal and sinus adipose tissue. Margins were negative. There was metastatic tumor in 2 of 2 lymph nodes. Pathologic stagewas T3aN1Mx.   PET scanon 08/24/2018 revealed a2.5 cmpotential lesion in the stomachjust below the GE junction has a maximum SUV of 16.2. There wasalso some accentuated activity in left upper quadrant jejunal loop. There werea variety of mildly enlarged lymph nodes, some with activity mildly above the background blood pool. Most of the pulmonary noduleshadlow-grade activity (benign versuslow-grade adenocarcinoma). The airspace opacity posteriorly in the aerated portion of the right lower lobe hadlow activity, probably reflecting rounded atelectasis. There wasa bandlike subpleural density in the superior segment right lower lobe whichwasmildly hypermetabolic (SUV 3.1), but with a sub solid appearance and contour atypical for malignancy.There was a small right pleural effusionthat hadaccentuated activity suggesting possible exudative effusion. There was accentuated activity in theleft eighth riblaterally (SUV 2.9), favorsmall healing nondisplaced rib fracture whichwas otherwise occult.  EGDon  10/12/2018 revealed normal gastroesophageal junction and esophagus. There was a gastric tumorin the cardia. There was erythematous mucosa in the antrum.Examined duodenum was normal. Pathologyfrom the cardiarevealed adenocarcinoma, moderately differentiated intestinal type, at least intramucosal, and intestinal metaplasia in the stomach mass. There was also antral and oxyntic mucosa with mild chronic inactive gastritis, negative for H pylori, intestinal metaplasia, dysplasia, or malignancy.  [facility] revealed a mass along the greater curvature of the proximal gastric body. Stagingwas T2N0. The mass was not amenable to endoscopic removal. Pathology revealed at leastintramucosal adenocarcinoma arising in a background of intestinal metaplasia and low- and high-grade dysplasia.  She received palliative gastric radiation from 11/22/2018 - 12/28/2018.  Chest CT on 01/03/2019 revealed the interval enlargement of the rounded irregular opacity at the posterior right lower lung could be due to round atelectasis or pneumonia. However given the interval enlargement underlying mass cannot be excluded.  There was stable bilateral pulmonary nodules  within the left upper lobe and right middle lobe.  There was centrilobular emphysematous changes.  There was stable right paratracheal prominent lymph nodes.  Chest CT on 02/15/2019 revealed irregular masslike, pleural based opacity in the posterior right lower lobe without significant change from the most recent chest CT, but increased in size compared to the CT from 06/2018. There was an expansile lytic type bone lesion in the lateral left 8th rib new since 06/2018, concerning for metastatic disease.  There were lung nodules. Nodule in the left lower lobe, 9 mm (increased from 6 mm).  Sub solid nodule, medial left upper lobe, 9 mm (increased from 6 mm).  Pleural base nodule anterior inferior right upper lobe 11 mm (stable).  There was prominent to  mildly enlarged mediastinal lymph nodes without significant change from 02/13/2018.  She underwentERCPon 08/19/2017 for chloedocholithiasis. One pancreatic stent was placed into the ventral pancreatic duct. A filling defect consistent with a stone was seen on the cholangiogram. The  entire main bile duct and common hepatic duct were moderately dilated. Choledocholithiasis was found. Complete removal was accomplished by biliary sphincterotomy and basket extraction.  She has iron deficiency anemia. Ferritin was 18 on 08/10/2016, 22 on 09/07/2016, 69 on 12/07/2016, 81 on 02/23/2017, 67 on 08/24/2017, 50 on 02/03/2018, 22 on 03/01/2018, and 25 on 05/31/2018.She denies pica. She is on oral iron.  She has chronic renal insufficiency. Creatinine was 1.86 (CrCl 26 ml/min) on 01/23/2018and 1.59 on 10/27/2018.CrCl is 26 ml/min.  She has a history of left lower extremity DVT. She is s/p left AKA.  Symptomatically, she denies any concerns.  She denies any shortness of breath or rib pain.    Plan: 1.   Labs today: CBC with diff, CMP, ferritin, iron studies, B12, folate. 2.   Clinical stage I gastric carcinoma She is s/p palliative radiation completed on 12/28/2018.             Patient received palliative radiation (completed 12/28/2018).. 3. Stage III renal cell carcinoma  She is s/p left radical nephrectomyon 01/09/2015. 4. Pulmonary nodules Review interval chest CT on 02/15/2019.              Imaging personally reviewed and discussed with Dr Patsey Berthold.   Bronchoscopy not recommended.   Consider PET scan.  Patient felt to be at high risk for invasive procedures of any kind due to her multiple comorbidities, general deconditioning and debility and marginal respiratory status.  5. B12 deficiencyand folate deficiency B12was197(low) and folatewas5.5(low) on 10/24/2018 ([facility] labs). B12 298 today.  B12 goal 400.  Folate  27.0 (normal). 6. Iron deficiency Hematocrit 32.5.  Hemoglobin 10.4.  MCV 85.3 today.  Ferritin 15 with an iron saturation of 8% and a TIBC 344.             She denies any bleeding.             She is taking oral iron QOD. 7.   PET scan on 02/27/2019. 8.   RTC after PET scan for MD assessment and discussion regarding direction of therapy.  I discussed the assessment and treatment plan with the patient.  The patient was provided an opportunity to ask questions and all were answered.  The patient agreed with the plan and demonstrated an understanding of the instructions.  The patient was advised to call back if the symptoms worsen or if the condition fails to improve as anticipated.  I provided 17 minutes of face-to-face time during this this encounter and > 50% was spent counseling as documented under my assessment and plan.    Lequita Asal, MD, PhD    02/22/2019, 3:11 PM  I, Selena Batten, am acting as scribe for [employer]. Mike Gip, MD, PhD.  I, Teliyah Royal C. Mike Gip, MD, have reviewed the above documentation for accuracy and completeness, and I agree with the above.

## 2019-02-20 NOTE — Progress Notes (Signed)
Patient's Name: Amy [name]  MRN: [id number]  Referring Provider: [name], FNP  DOB: 10-Sep-1935  PCP: [name], FNP  DOS: 02/20/2019  Note by: Gillis Santa, MD  Service setting: Ambulatory outpatient  Attending: Gillis Santa, MD  Location: [facility] (AMB) Pain Management Facility  Specialty: Interventional Pain Management  Patient type: Established   Primary Reason(s) for Visit: Encounter for prescription drug management. (Level of risk: moderate)  CC: Back Pain (lower)  HPI  Amy [name] is a 83 y.o. year old, female patient, who comes today for a medication management evaluation. She has Anxiety; Elevated serum creatinine; Diabetes mellitus type 2, controlled ([place]); GERD (gastroesophageal reflux disease); GAD (generalized anxiety disorder); AB (asthmatic bronchitis); Aortic atherosclerosis ([place]); Cataract; Leg pain; Continuous opioid dependence (HCC); CKD (chronic kidney disease) stage 4, GFR 15-29 ml/min (HCC); COPD (chronic obstructive pulmonary disease) ([place]); Excessive falling; Fatty infiltration of liver; H/O adenomatous polyp of colon; Hyperlipidemia; Anterior knee pain; LBP (low back pain); Malaise and fatigue; Abnormal presence of protein in urine; PVD (peripheral vascular disease) ([place]); Allergic rhinitis; At risk for falling; Renal mass; Abnormal EKG; Renal neoplasm; S/p nephrectomy; 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 ([place]); Wheelchair dependent; S/P AKA (above knee amputation) unilateral, left ([place]); Anemia; Iron deficiency anemia; Depression; Chronic pain syndrome; Late onset Alzheimer's disease with behavioral disturbance ([place]); Choledocholithiasis; Osteopenia; QT prolongation; Hypertensive renal disease; Secondary hyperparathyroidism of renal origin ([place]); Lung nodule; HCAP (healthcare-associated pneumonia); Hypoxemia; Renal cell carcinoma ([place]); Hypoxia; Goals of care, counseling/discussion; Palliative care by specialist; Pulmonary nodules; Obesity (BMI  30.0-34.9); Gastric cancer ([place]); Stomach irritation; B12 deficiency; Folate deficiency; Genital labial ulcer; Cancer of intra-abdominal lymph nodes ([place]); and Right lower lobe pulmonary nodule on their problem list. Her primarily concern today is the Back Pain (lower)  Pain Assessment: Location: [place](husband states she had a bed sore) Radiating: denies Onset: More than a month ago Duration: Chronic pain Quality: Aching, Discomfort Severity: 0-No pain(Pt states she is not hurting now)/10 (subjective, self-reported pain score)  Note: Reported level is compatible with observation.                         When using our objective Pain Scale, levels between 6 and 10/10 are said to belong in an emergency room, as it progressively worsens from a 6/10, described as severely limiting, requiring emergency care not usually available at an outpatient pain management facility. At a 6/10 level, communication becomes difficult and requires great effort. Assistance to reach the emergency department may be required. Facial flushing and profuse sweating along with potentially dangerous increases in heart rate and blood pressure will be evident. Effect on ADL: walking, standing, supposed to lay on side but lays on back Timing: Constant Modifying factors: mediations BP: (!) 136/58  HR: 78  Amy [name] was last scheduled for an appointment on 12/19/2018 for medication management. During today's appointment we reviewed Amy [name] chronic pain status, as well as her outpatient medication regimen.  The patient  reports no history of drug use. Her body mass index is 28.17 kg/m.  Further details on both, my assessment(s), as well as the proposed treatment plan, please see below.     Patient's pain is at baseline.  Patient continues multimodal pain regimen as prescribed.  States that it provides pain relief and improvement in functional status.  Controlled Substance Pharmacotherapy Assessment REMS (Risk  Evaluation and Mitigation Strategy)  Analgesic  02/06/2019  1   12/19/2018  Hydrocodone-Acetamin 7.5-325  90.00  30 Bi Lat   [id number]   Thr (4878)   0  22.50 MME  Medicare   [place]     Ignatius Specking, RN  02/20/2019 10:48 AM  Sign when Signing Visit Nursing Pain Medication Assessment:  Safety precautions to be maintained throughout the outpatient stay will include: orient to surroundings, keep bed in low position, maintain call bell within reach at all times, provide assistance with transfer out of bed and ambulation.  Medication Inspection Compliance: Amy [name] did not comply with our request to bring her pills to be counted. She was reminded that bringing the medication bottles, even when empty, is a requirement.  Medication: None brought in. Pill/Patch Count: None available to be counted. Bottle Appearance: No container available. Did not bring bottle(s) to appointment. Filled Date: N/A Last Medication intake:  Today   Pharmacokinetics: Liberation and absorption (onset of action): WNL Distribution (time to peak effect): WNL Metabolism and excretion (duration of action): WNL         Pharmacodynamics: Desired effects: Analgesia: Amy [name] reports >50% benefit. Functional ability: Patient reports that medication allows her to accomplish basic ADLs Clinically meaningful improvement in function (CMIF): Sustained CMIF goals met Perceived effectiveness: Described as relatively effective, allowing for increase in activities of daily living (ADL) Undesirable effects: Side-effects or Adverse reactions: None reported Monitoring: [place] PMP: PDMP not reviewed this encounter. Online review of the past 34-monthperiod conducted. Compliant with practice rules and regulations Last UDS on record: No results found for: SUMMARY UDS interpretation: Compliant          Medication Assessment Form: Reviewed. Patient indicates being compliant with therapy Treatment compliance: Compliant Risk Assessment  Profile: Aberrant behavior: See initial evaluations. None observed or detected today Comorbid factors increasing risk of overdose: See initial evaluation. No additional risks detected today Opioid risk tool (ORT):  Opioid Risk  02/20/2019  Alcohol -  Illegal Drugs -  Rx Drugs -  Alcohol 0  Illegal Drugs 0  Rx Drugs 0  Age between 16-45 years  0  History of Preadolescent Sexual Abuse 0  Psychological Disease 2  ADD Negative  OCD Negative  Bipolar Negative  Depression 1  Opioid Risk Tool Scoring 3  Opioid Risk Interpretation Low Risk    ORT Scoring interpretation table:  Score <3 = Low Risk for SUD  Score between 4-7 = Moderate Risk for SUD  Score >8 = High Risk for Opioid Abuse   Risk of substance use disorder (SUD): Low  Risk Mitigation Strategies:  Patient Counseling: Covered Patient-Prescriber Agreement (PPA): Present and active  Notification to other healthcare providers: Done  Pharmacologic Plan: No change in therapy, at this time.             Laboratory Chemistry Profile   Screening Lab Results  Component Value Date   SARSCOV2NAA NOT DETECTED 10/09/2018   COVIDSOURCE NASOPHARYNGEAL 10/09/2018   MRSAPCR NEGATIVE 07/19/2018    Inflammation (CRP: Acute Phase) (ESR: Chronic Phase) Lab Results  Component Value Date   ESRSEDRATE 19 01/27/2017                         Rheumatology Lab Results  Component Value Date   RF <14 01/27/2017   ANA NEGATIVE 01/27/2017                        Renal Lab Results  Component Value Date   BUN 18 01/03/2019   CREATININE  1.68 (H) 01/03/2019   LABCREA 111 02/03/2018   BCR 9 07/12/2018   GFRAA 33 (L) 01/03/2019   GFRNONAA 28 (L) 01/03/2019                             Hepatic Lab Results  Component Value Date   AST 17 11/17/2018   ALT 12 11/17/2018   ALBUMIN 3.1 (L) 11/17/2018   ALKPHOS 84 11/17/2018   LIPASE 34 11/17/2018                        Electrolytes Lab Results  Component Value Date   NA 138  01/03/2019   K 4.7 01/03/2019   CL 107 01/03/2019   CALCIUM 8.7 (L) 01/03/2019   MG 1.9 07/02/2018                        Neuropathy Lab Results  Component Value Date   VITAMINB12 456 03/31/2016   FOLATE 9.8 07/14/2016   HGBA1C 5.8 01/19/2019                        CNS No results found for: COLORCSF, APPEARCSF, RBCCOUNTCSF, WBCCSF, POLYSCSF, LYMPHSCSF, EOSCSF, PROTEINCSF, GLUCCSF, JCVIRUS, CSFOLI, IGGCSF, LABACHR, ACETBL                      Bone Lab Results  Component Value Date   VD25OH 17 (L) 02/03/2018                         Coagulation Lab Results  Component Value Date   PLT 259 01/03/2019                        Cardiovascular Lab Results  Component Value Date   BNP 109.0 (H) 07/02/2018   TROPONINI <0.03 07/17/2018   HGB 11.4 (L) 01/03/2019   HCT 36.3 01/03/2019                         ID Lab Results  Component Value Date   SARSCOV2NAA NOT DETECTED 10/09/2018   MRSAPCR NEGATIVE 07/19/2018   MICROTEXT  08/15/2014       COMMENT                   NO GROWTH AEROBICALLY/ANAEROBICALLY IN 5 DAYS   ANTIBIOTIC                                                        Cancer No results found for: CEA, CA125, LABCA2                      Endocrine Lab Results  Component Value Date   TSH 2.96 06/15/2016                        Note: Lab results reviewed.  Recent Diagnostic Imaging Results  CT Chest Wo Contrast CLINICAL DATA:  No chest pains, no sob no hx of surgeries to her chest she states. Pt states she was told she has cancer in her chest.  EXAM: CT CHEST WITHOUT CONTRAST  TECHNIQUE: Multidetector CT imaging of the chest was performed following the standard protocol without IV contrast.  COMPARISON:  Chest CT, 01/03/2019.  FINDINGS: Cardiovascular: Heart is normal in size. No pericardial effusion 2 vessel coronary artery calcifications. Great vessels normal in caliber. There are aortic atherosclerotic calcifications which extend to the  origins of the arch branch vessels.  Mediastinum/Nodes: No neck base or axillary masses or enlarged lymph nodes. Right precarinal enlarged lymph nodes, 1.5 cm in short axis. Prominent right paratracheal nodes, largest 1 cm short axis. Nodes are stable from the prior CT. No hilar masses or enlarged lymph nodes. Trachea and esophagus are unremarkable.  Lungs/Pleura: Moderate to advanced centrilobular emphysema.  Pleural based opacity, posterior right lower lobe, centered on image 114, series 3, measuring 5 x 1.3 x 3.3 cm, without change from the prior CT. There are smaller adjacent pleural based right lower lobe opacities that are also stable.  9 mm nodule, left lower lobe, image 106 series 3. This is also stable.  Sub solid nodule, left upper lobe, abutting the medial pleura, image 42, series 3, 9 mm, unchanged.  Pleural base nodule, anterior inferior right lobe, 11 x 7 mm, mean 9 mm, image 99, series 3.  When compared to the CT dated 07/17/2018 the pleural based opacity in the posterior lower lobe has increased. Nodules stable.  Moderate to advanced centrilobular emphysema. There stable areas reticular scarring mild bronchiectasis most evident posterior right upper lobe.  No pleural effusion.  No pneumothorax.  Upper Abdomen: No acute abnormality.  Musculoskeletal: Chronic compression fracture of T12. Expansile lesion in the lateral eighth rib. This is new since 07/17/2018. No other bone lesions.  IMPRESSION: 1. Irregular masslike, pleural based opacity in the posterior right lower lobe is without significant change from the most recent chest CT, but increased in size compared to the CT from February 2020. Given the persistence, infection/inflammation is felt unlikely with neoplastic disease or rounded atelectasis the most likely etiologies. Recommend consultation with pulmonary medicine or cardiothoracic surgery if this has not been previously performed. 2. Expansile  lytic type bone lesion in the lateral left eighth rib new since February 2020, concerning for metastatic disease. 3. Lung nodules. Nodule in the left lower lobe, 9 mm, increased from 6 mm on the CT dated 02/13/2018. Sub solid nodule, medial left upper lobe, 9 mm, increased from 6 mm on 02/13/2018. Pleural base nodule anterior inferior right upper lobe 11 mm in long axis, stable. 4. Moderate centrilobular emphysema. 5. Coronary artery and aortic atherosclerotic calcifications. 6. Prominent to mildly enlarged mediastinal lymph nodes are stable from the most recent prior exam and without significant change from the study dated 02/13/2018.  Aortic Atherosclerosis (ICD10-I70.0) and Emphysema (ICD10-J43.9).  Electronically Signed   By: Lajean Manes M.D.   On: 02/16/2019 08:16  Complexity Note: Imaging results reviewed. Results shared with Amy [name], using Layman's terms.                               Meds   Current Outpatient Medications:  .  acetaminophen (TYLENOL) 325 MG tablet, Take 325 mg by mouth every 6 (six) hours as needed., Disp: , Rfl:  .  albuterol (VENTOLIN HFA) 108 (90 Base) MCG/ACT inhaler, Inhale 1-2 puffs into the lungs every 4 (four) hours as needed. , Disp: , Rfl:  .  atorvastatin (LIPITOR) 20 MG tablet, Take 1 tablet (20 mg total) by mouth daily at 6 PM., Disp:  90 tablet, Rfl: 3 .  clopidogrel (PLAVIX) 75 MG tablet, TAKE 1 TABLET EVERY DAY (Patient taking differently: Take 75 mg by mouth daily. ), Disp: 90 tablet, Rfl: 3 .  donepezil (ARICEPT) 10 MG tablet, Take 1 tablet (10 mg total) by mouth at bedtime., Disp: 90 tablet, Rfl: 3 .  famotidine (PEPCID) 20 MG tablet, Take 1 tablet (20 mg total) by mouth daily., Disp: 90 tablet, Rfl: 3 .  ferrous sulfate 325 (65 FE) MG tablet, Take 325 mg by mouth daily with breakfast. Pt taking every other day, Disp: , Rfl:  .  FLUoxetine (PROZAC) 10 MG capsule, Take 1 capsule (10 mg total) by mouth daily., Disp: 30 capsule, Rfl: 0 .   gabapentin (NEURONTIN) 300 MG capsule, Take 1 capsule (300 mg total) by mouth 3 (three) times daily., Disp: 270 capsule, Rfl: 3 .  [START ON 03/07/2019] HYDROcodone-acetaminophen (NORCO) 7.5-325 MG tablet, Take 1 tablet by mouth every 8 (eight) hours as needed for severe pain. Must last 30 days., Disp: 90 tablet, Rfl: 0 .  [START ON 04/06/2019] HYDROcodone-acetaminophen (NORCO) 7.5-325 MG tablet, Take 1 tablet by mouth every 8 (eight) hours as needed for severe pain. Must last 30 days., Disp: 90 tablet, Rfl: 0 .  [START ON 05/06/2019] HYDROcodone-acetaminophen (NORCO) 7.5-325 MG tablet, Take 1 tablet by mouth every 8 (eight) hours as needed for severe pain. Must last 30 days., Disp: 90 tablet, Rfl: 0 .  ipratropium-albuterol (DUONEB) 0.5-2.5 (3) MG/3ML SOLN, INHALE THE CONTENTS OF 1 VIAL VIA NEBULIZER THREE TIMES DAILY AS DIRECTED (Patient taking differently: Inhale 3 mLs into the lungs 3 (three) times daily. ), Disp: 810 mL, Rfl: 2 .  pantoprazole (PROTONIX) 40 MG tablet, Take 1 tablet (40 mg total) by mouth daily., Disp: 90 tablet, Rfl: 0 .  QUEtiapine (SEROQUEL) 25 MG tablet, TAKE 1 TABLET EVERY MORNING, 1 TABLET EVERY AFTERNOON AND 2 TABLETS BEFORE BEDTIME (Patient taking differently: TAKE 25 mg  EVERY MORNING, take 75 mg  BEFORE BEDTIME), Disp: 150 tablet, Rfl: 1 .  Thiamine HCl (VITAMIN B-1) 250 MG tablet, Take 250 mg by mouth daily., Disp: , Rfl:  .  Ascorbic Acid (VITAMIN C) 1000 MG tablet, Take 1,000 mg by mouth daily., Disp: , Rfl:   ROS  Constitutional: Denies any fever or chills Gastrointestinal: No reported hemesis, hematochezia, vomiting, or acute GI distress Musculoskeletal: Denies any acute onset joint swelling, redness, loss of ROM, or weakness Neurological: No reported episodes of acute onset apraxia, aphasia, dysarthria, agnosia, amnesia, paralysis, loss of coordination, or loss of consciousness  Allergies  Amy [name] is allergic to fentanyl and penicillins.  PFSH  Drug: Ms.  [name]  reports no history of drug use. Alcohol:  reports previous alcohol use. Tobacco:  reports that she quit smoking about 14 years ago. Her smoking use included cigarettes. She has a 76.50 pack-year smoking history. She has never used smokeless tobacco. Medical:  has a past medical history of Anemia, Anxiety, Arthritis, Bilateral cataracts, Bronchitis, Cataract, Chronic kidney disease, Depression, Diabetes mellitus without complication ([place]), DVT (deep venous thrombosis) ([place]), Emphysema of lung ([place]), GERD (gastroesophageal reflux disease), Headache, History of frequent urinary tract infections, renal cell cancer, Hyperlipidemia, Hypertension, Numbness and tingling, Obesity, Osteopenia (10/05/2017), Peripheral artery disease ([place]), Urinary frequency, and Urinary incontinence. Surgical: Ms. Pallo  has a past surgical history that includes Leg amputation; Abdominal hysterectomy (1978); Cholecystectomy; Carpal tunnel release (Bilateral); stent placement right leg ; Robot assisted laparoscopic nephrectomy (Left, 01/15/2015); Colonoscopy; Colonoscopy with esophagogastroduodenoscopy (egd); ERCP (N/A,  08/19/2017); and Esophagogastroduodenoscopy (egd) with propofol (N/A, 10/12/2018). Family: family history includes Cancer in her brother, father, and mother; Diabetes in her brother; Healthy in her sister and son.  Constitutional Exam  General appearance: Well nourished, well developed, and well hydrated. In no apparent acute distress Vitals:   02/20/19 1037  BP: (!) 136/58  Pulse: 78  Resp: 18  Temp: 98.3 F (36.8 C)  SpO2: 93%  Weight: 154 lb (69.9 kg)  Height: 5' 2"  (1.575 m)   BMI Assessment: Estimated body mass index is 28.17 kg/m as calculated from the following:   Height as of this encounter: 5' 2"  (1.575 m).   Weight as of this encounter: 154 lb (69.9 kg).  BMI interpretation table: BMI level Category Range association with higher incidence of chronic pain  <18 kg/m2 Underweight    18.5-24.9 kg/m2 Ideal body weight   25-29.9 kg/m2 Overweight Increased incidence by 20%  30-34.9 kg/m2 Obese (Class I) Increased incidence by 68%  35-39.9 kg/m2 Severe obesity (Class II) Increased incidence by 136%  >40 kg/m2 Extreme obesity (Class III) Increased incidence by 254%   Patient's current BMI Ideal Body weight  Body mass index is 28.17 kg/m. Ideal body weight: 50.1 kg (110 lb 7.2 oz) Adjusted ideal body weight: 58 kg (127 lb 13.9 oz)   BMI Readings from Last 4 Encounters:  02/20/19 28.17 kg/m  01/05/19 30.00 kg/m  11/20/18 28.63 kg/m  11/02/18 28.90 kg/m   Wt Readings from Last 4 Encounters:  02/20/19 154 lb (69.9 kg)  01/05/19 164 lb (74.4 kg)  11/20/18 156 lb 8.4 oz (71 kg)  11/02/18 158 lb (71.7 kg)  Psych/Mental status: Alert, oriented x 3 (person, place, & time)       Eyes: PERLA Respiratory: No evidence of acute respiratory distress  Cervical Spine Area Exam  Skin & Axial Inspection: No masses, redness, edema, swelling, or associated skin lesions Alignment: Symmetrical Functional ROM: Unrestricted ROM      Stability: No instability detected Muscle Tone/Strength: Functionally intact. No obvious neuro-muscular anomalies detected. Sensory (Neurological): Unimpaired Palpation: No palpable anomalies                     Upper Extremity (UE) Exam    Side: Right upper extremity  Side: Left upper extremity   Skin & Extremity Inspection: Skin color, temperature, and hair growth are WNL. No peripheral edema or cyanosis. No masses, redness, swelling, asymmetry, or associated skin lesions. No contractures.  Skin & Extremity Inspection: Skin color, temperature, and hair growth are WNL. No peripheral edema or cyanosis. No masses, redness, swelling, asymmetry, or associated skin lesions. No contractures.   Functional ROM: Unrestricted ROM          Functional ROM: Unrestricted ROM           Muscle Tone/Strength: Functionally intact. No obvious neuro-muscular  anomalies detected.  Muscle Tone/Strength: Functionally intact. No obvious neuro-muscular anomalies detected.   Sensory (Neurological): Unimpaired          Sensory (Neurological): Unimpaired           Palpation: No palpable anomalies              Palpation: No palpable anomalies               Specialized Test(s): Deferred         Specialized Test(s): Deferred           Thoracic Spine Area Exam  Skin & Axial Inspection: No masses, redness, or  swelling Alignment: Symmetrical Functional ROM: Unrestricted ROM Stability: No instability detected Muscle Tone/Strength: Functionally intact. No obvious neuro-muscular anomalies detected. Sensory (Neurological): Unimpaired Muscle strength & Tone: No palpable anomalies  Lumbar Spine Area Exam  Skin & Axial Inspection: No masses, redness, or swelling Alignment: Symmetrical Functional ROM: Unrestricted ROM       Stability: No instability detected Muscle Tone/Strength: Functionally intact. No obvious neuro-muscular anomalies detected. Sensory (Neurological): Unimpaired Palpation: No palpable anomalies       Provocative Tests: Lumbar Hyperextension and rotation test: evaluation deferred today       Lumbar Lateral bending test: evaluation deferred today       Patrick's Maneuver: evaluation deferred today                    Gait & Posture Assessment  Patient in wheelchair. Significant kyphosis.   Lower Extremity Exam    Side: Right lower extremity  Side: Left lower extremity  Skin & Extremity Inspection: Skin color, temperature, and hair growth are WNL. No peripheral edema or cyanosis. No masses, redness, swelling, asymmetry, or associated skin lesions. No contractures.  Skin & Extremity Inspection: Above knee amputation (AKA)  Functional ROM: Unrestricted ROM          Functional ROM: Decreased ROM          Muscle Tone/Strength: Functionally intact. No obvious neuro-muscular anomalies detected.  Muscle Tone/Strength: Functionally  intact. No obvious neuro-muscular anomalies detected.  Sensory (Neurological): Unimpaired  Sensory (Neurological): Paresthesia (Tingling sensation)  Palpation: No palpable anomalies  Palpation: No palpable anomalies     Assessment   Status Diagnosis  Controlled Controlled Controlled 1. Chronic pain syndrome   2. Phantom limb syndrome with pain ([place])   3. S/P AKA (above knee amputation) unilateral, left ([place])   4. Continuous opioid dependence ([place])   5. S/p nephrectomy   6. CKD (chronic kidney disease) stage 4, GFR 15-29 ml/min (HCC)   7. 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 Treatment will often involve judicious use of pain medications and interventional procedures to decrease the pain, allowing the patient to participate in the physical activity that will ultimately produce long-lasting pain reductions. The goal of the multidisciplinary approach is to return the patient to a higher level of overall function and to restore their ability to perform activities of daily living.   Plan of Care  Pharmacotherapy (Medications Ordered): Meds ordered this encounter  Medications  . HYDROcodone-acetaminophen (NORCO) 7.5-325 MG tablet    Sig: Take 1 tablet by mouth every 8 (eight) hours as needed for severe pain. Must last 30 days.    Dispense:  90 tablet    Refill:  0    [place] STOP ACT - Not applicable. Fill one day early if pharmacy is closed on scheduled refill date.  [name] HYDROcodone-acetaminophen (NORCO) 7.5-325 MG tablet    Sig: Take 1 tablet by mouth every 8 (eight) hours as needed for severe pain. Must last 30 days.     Dispense:  90 tablet    Refill:  0    [place] STOP ACT - Not applicable. Fill one day early if pharmacy is closed on scheduled refill date.  [name]  HYDROcodone-acetaminophen (NORCO) 7.5-325 MG tablet    Sig: Take 1 tablet by mouth every 8 (eight) hours as needed for severe pain. Must last 30 days.    Dispense:  90 tablet    Refill:  0    [place] STOP ACT - Not applicable. Fill one day early if pharmacy is closed on scheduled refill date.    Orders:  Orders Placed This Encounter  Procedures  . Drug Screen 10 W/Conf, Serum  . ToxASSURE Select 13 (MW), Urine    Volume: 30 ml(s). Minimum 3 ml of urine is needed. Document temperature of fresh sample. Indications: Long term (current) use of opiate analgesic (Z79.891)    Lab Orders     Drug Screen 10 W/Conf, Serum     ToxASSURE Select 13 (MW), Urine  Planned follow-up:   Return in about 3 months (around 06/03/2019) for Medication Management, virtual.   Recent Visits Date Type Provider Dept  12/19/18 Office Visit Gillis Santa, MD [facility] [facility]  Showing recent visits within past 90 days and meeting all other requirements   Today's Visits Date Type Provider Dept  02/20/19 Office Visit Gillis Santa, MD [facility]  Showing today's visits and meeting all other requirements   Future Appointments No visits were found meeting these conditions.  Showing future appointments within next 90 days and meeting all other requirements   Primary Care Physician: [name], FNP Location: [facility] Note by: Gillis Santa, MD Date: 02/20/2019; Time: 11:18 AM  Note: This dictation was prepared with Dragon dictation. Any transcriptional errors that may result from this process are unintentional.

## 2019-02-20 NOTE — Progress Notes (Signed)
Nursing Pain Medication Assessment:  Safety precautions to be maintained throughout the outpatient stay will include: orient to surroundings, keep bed in low position, maintain call bell within reach at all times, provide assistance with transfer out of bed and ambulation.  Medication Inspection Compliance: Amy [name] did not comply with our request to bring her pills to be counted. She was reminded that bringing the medication bottles, even when empty, is a requirement.  Medication: None brought in. Pill/Patch Count: None available to be counted. Bottle Appearance: No container available. Did not bring bottle(s) to appointment. Filled Date: N/A Last Medication intake:  Today

## 2019-02-21 ENCOUNTER — Encounter: Payer: Self-pay | Admitting: Pulmonary Disease

## 2019-02-21 ENCOUNTER — Ambulatory Visit: Payer: Medicare HMO | Admitting: Internal Medicine

## 2019-02-21 ENCOUNTER — Ambulatory Visit: Payer: Medicare HMO | Admitting: Pulmonary Disease

## 2019-02-21 VITALS — BP 134/60 | HR 87 | Temp 98.2°F | Ht 62.0 in

## 2019-02-21 DIAGNOSIS — J449 Chronic obstructive pulmonary disease, unspecified: Secondary | ICD-10-CM

## 2019-02-21 DIAGNOSIS — R54 Age-related physical debility: Secondary | ICD-10-CM

## 2019-02-21 DIAGNOSIS — R911 Solitary pulmonary nodule: Secondary | ICD-10-CM

## 2019-02-21 DIAGNOSIS — J9611 Chronic respiratory failure with hypoxia: Secondary | ICD-10-CM

## 2019-02-21 DIAGNOSIS — F0391 Unspecified dementia with behavioral disturbance: Secondary | ICD-10-CM | POA: Diagnosis not present

## 2019-02-21 DIAGNOSIS — R0789 Other chest pain: Secondary | ICD-10-CM | POA: Diagnosis not present

## 2019-02-21 DIAGNOSIS — C169 Malignant neoplasm of stomach, unspecified: Secondary | ICD-10-CM

## 2019-02-21 NOTE — Patient Instructions (Signed)
You can follow up here as needed.  Contact our office if you develop any new or worsen symptoms.

## 2019-02-21 NOTE — Progress Notes (Signed)
Subjective:    Patient ID: Amy [name], female    DOB: 1936/01/18, 83 y.o.   MRN: [id number]  HPI Patient is an 83 year old former smoker (quit 2006) with multiple comorbidities to include dementia (mixed type), COPD with chronic hypoxic respiratory failure on oxygen at 2 L/min, gastric adenocarcinoma T2N0 (10/2018) and stage III renal cell carcinoma (2016) who presents today for unclear reasons though it appears that there is a question about an abnormal CT scan of the chest.  The patient is a very poor historian due to her baseline dementia and her husband who presents with her is the informant.  The patient has been wheelchair-bound for years.  Her baseline activity is mainly sitting in a recliner chair at home or being in a wheelchair when having doctors visits.  She was evaluated by Dr. Ashby Dawes in April last year for COPD issues basically chest congestion, dyspnea and cough.  Patient has significant emphysema noted on CT scan of the chest previously.  She has not been able to undergo pulmonary function testing due to poor comprehension of test instructions.  She also has not been able to use metered-dose inhalers, dry powder inhalers or nebulizers due to inability to comprehend how to use the devices.  She has been provided with a mask for her nebulizer therapy but the husband states that she gets "inpatient" therapies and refuses to use a nebulizer.  She does not use an airway clearance device as previously recommended by Dr. Ashby Dawes.  In this regard however the patient appears to be relatively compensated and that she does not present with any complaint of dyspnea today and this is corroborated by her husband.  She does have a congested cough but again does not follow through with instructions for airway clearance.  She has been on 2 L/min nasal cannula O2 for approximately 4 to 5 months now.  With regards to abnormal CT scan of the chest the patient was diagnosed with adenocarcinoma  of the stomach in May this year.  She underwent endoscopy at [facility] for potential resection endoscopically however this was not possible.  She was not a surgical candidate and underwent palliative radiation for a total of 25 treatments under the supervision of Dr. Baruch Gouty, she follows with Dr. Mike Gip with regards to her general oncologic issues.  In August 2020 she complained of left-sided chest pain and increasing shortness of breath and underwent a CT scan of the chest that showed an opacity in the posterior RIGHT lower lung which was pleural-based and appeared to be enlarged from a CT scan performed in February 2020.  In February this was new from a prior CT in January 2020.  The patient also has numerous smaller nodules that have been stable.  The patient underwent PET/CT in April 2020 this showed the activity at the GE junction which subsequently was proven to be adenocarcinoma.  Incidentally there was also increased activity on the eighth LEFT rib that was at the time considered to be due to potential healing fracture.  Of note on her CT scan from August, there is evidence of abnormality on the eighth LEFT rib, the patient had follow-up CT on 16 February 2019 and this lesion on the eighth rib appears to be expanding consistent with a metastatic deposit.  In addition, the opacity on the RIGHT lower lung appears to be increasing as well.  Of note all of her CT scans show significant panlobular emphysema.  I reviewed all of the films with the patient's  husband and with the patient the patient's husband asked appropriate questions and these were answered.  The patient today endorses no specific complaint.  The patient's husband informs that she is having some discomfort on the left side of her chest.  She has not had any hemoptysis, she has chronic cough and sputum production sputum is white to gray.  As noted she cannot use bronchodilators effectively be it on metered-dose, dry powder or nebulizer form due to  inability to follow inspiratory maneuvers likely related to her dementia.  As an aside the patient is in the process of being evaluated and followed by Palliative Care.  I have reviewed all of the available records.  Have reviewed her films as noted.  Past medical history, surgical history and social history has been reviewed in detail.  As noted previously the patient's husband is the main informant due to the patient's dementia.  Review of Systems  Unable to perform ROS: Dementia  Husband provides limited review of systems.  Patient is having some left-sided chest pain.  Dyspnea improved with the use of oxygen.     Objective:   Physical Exam Vitals signs and nursing note reviewed.  Constitutional:      General: She is not in acute distress.    Appearance: She is obese. She is ill-appearing (Chronically ill-appearing, debilitated).  HENT:     Head: Normocephalic and atraumatic.     Right Ear: External ear normal.     Left Ear: External ear normal.     Nose: Nose normal.     Mouth/Throat:     Mouth: Mucous membranes are moist.     Comments: Edentulous Eyes:     General: No scleral icterus.    Conjunctiva/sclera: Conjunctivae normal.     Pupils: Pupils are equal, round, and reactive to light.  Neck:     Musculoskeletal: Neck supple.     Thyroid: No thyromegaly.     Trachea: Trachea and phonation normal.  Cardiovascular:     Rate and Rhythm: Normal rate and regular rhythm.     Heart sounds: Normal heart sounds, S1 normal and S2 normal. No murmur.  Pulmonary:     Effort: Pulmonary effort is normal.     Breath sounds: Decreased air movement present. Rhonchi present. No wheezing or rales.  Chest:     Comments: Increased AP diameter, localized tenderness lower LEFT chest wall Abdominal:     General: Abdomen is protuberant. There is no distension.     Palpations: Abdomen is soft.     Tenderness: There is no abdominal tenderness.  Musculoskeletal:     Right lower leg: No edema.       Comments: Status post LEFT AKA  Skin:    General: Skin is warm and dry.  Neurological:     Mental Status: She is alert. She is confused.     Motor: Weakness present.     Comments: Has difficulties comprehending situation.   Psychiatric:        Attention and Perception: She is inattentive.        Mood and Affect: Affect is flat.        Speech: Speech is tangential.        Behavior: Behavior is cooperative.        Cognition and Memory: Memory is impaired.   Representative image from CT scan performed 15 February 2019: Large confluent mass, pleural-based RIGHT posterior lung.   Representative coronal view shows expansile lesion on LEFT eighth rib this has  been expanding from prior chest CT suspect metastatic deposit.  Read.placed on affected rib.     Assessment & Plan:   1.  RIGHT posterior lung nodule/mass: The lesion is of concern as this has continued to grow and has developed more solid characteristics.  It is however, pleural-based and not amenable for diagnosis with bronchoscopy for which the patient is not a candidate due to physiologic limitations (severe respiratory impairment) and technical limitations (pleural-based mass).  I would recommend IF further work-up is pursued to obtain PET/CT particularly in view of other abnormalities noted as below.  If this area show significant uptake then the best diagnostic approach would be with percutaneous needle aspiration due to the pleural base nature of the lesion.  However, in my opinion, the patient is high risk for invasive procedures of any kind due to her multiple comorbidities, general deconditioning and debility and marginal, at best, respiratory status.  2.  COPD with chronic bronchitis and emphysema: The patient has not been able to perform pulmonary function testing due to underlying dementia and inability to produce maneuvers necessary.  The patient also has inability to use metered-dose inhalers, dry powder inhalers and  nebulizers due to poor comprehension of the mechanics necessary to utilize these devices.  She even has difficulties with nebulizers via mask, patient's husband states she gets "impatient" and discontinues treatments.  Approach to this would be from a purely palliative standpoint.  From a clinical perspective, the patient appears to be quite advanced with regards to COPD.  3.  Chronic hypoxic respiratory failure: Continue oxygen at 2 L/min.  4.  Localized chest pain, LEFT chest wall: This may be related to the metastatic deposit on her LEFT eighth rib.  She may need palliative radiation to that area.  5.  Adenocarcinoma of the stomach: Status post palliative radiation, initially staged as T2N0, query stage IV given findings from most recent CT.  6.  Dementia, with documented behavioral disturbances: Appears to be well compensated in this regard, management per neurology.  This issue adds complexity to her management as she does not have the capacity to comprehend context of diagnosis and therapies.  7.  Advanced age, frailty, deconditioning and debility: This issue adds complexity to her management and impacts on her ability to tolerate procedures.   All of the above was discussed with the patient's husband.  He was satisfied with the explanations provided.  We also discussed to give Palliative Care a more active role in her care.  Recommend clear delineation of goals of care.  In my opinion this patient would do very poorly with aggressive resuscitation methods such as ACLS etc., she would do very poorly  if intubated and mechanically ventilated.  Follow-up here will be on an as-needed basis.  Total time of this visit 45 minutes.   This chart was dictated using voice recognition software/Dragon.  Despite best efforts to proofread, errors can occur which can change the meaning.  Any change was purely unintentional.

## 2019-02-22 ENCOUNTER — Other Ambulatory Visit: Payer: Self-pay

## 2019-02-22 ENCOUNTER — Inpatient Hospital Stay: Payer: Medicare HMO | Attending: Hematology and Oncology | Admitting: Hematology and Oncology

## 2019-02-22 ENCOUNTER — Encounter: Payer: Self-pay | Admitting: Hematology and Oncology

## 2019-02-22 ENCOUNTER — Inpatient Hospital Stay: Payer: Medicare HMO

## 2019-02-22 VITALS — BP 142/108 | HR 88 | Temp 97.9°F | Resp 18 | Ht 62.0 in

## 2019-02-22 DIAGNOSIS — I129 Hypertensive chronic kidney disease with stage 1 through stage 4 chronic kidney disease, or unspecified chronic kidney disease: Secondary | ICD-10-CM | POA: Diagnosis not present

## 2019-02-22 DIAGNOSIS — C642 Malignant neoplasm of left kidney, except renal pelvis: Secondary | ICD-10-CM

## 2019-02-22 DIAGNOSIS — E538 Deficiency of other specified B group vitamins: Secondary | ICD-10-CM | POA: Diagnosis not present

## 2019-02-22 DIAGNOSIS — C162 Malignant neoplasm of body of stomach: Secondary | ICD-10-CM

## 2019-02-22 DIAGNOSIS — D509 Iron deficiency anemia, unspecified: Secondary | ICD-10-CM

## 2019-02-22 DIAGNOSIS — Z923 Personal history of irradiation: Secondary | ICD-10-CM | POA: Insufficient documentation

## 2019-02-22 DIAGNOSIS — R918 Other nonspecific abnormal finding of lung field: Secondary | ICD-10-CM | POA: Diagnosis not present

## 2019-02-22 DIAGNOSIS — Z85528 Personal history of other malignant neoplasm of kidney: Secondary | ICD-10-CM | POA: Diagnosis not present

## 2019-02-22 DIAGNOSIS — E1122 Type 2 diabetes mellitus with diabetic chronic kidney disease: Secondary | ICD-10-CM | POA: Diagnosis not present

## 2019-02-22 DIAGNOSIS — C7951 Secondary malignant neoplasm of bone: Secondary | ICD-10-CM | POA: Insufficient documentation

## 2019-02-22 DIAGNOSIS — C169 Malignant neoplasm of stomach, unspecified: Secondary | ICD-10-CM | POA: Insufficient documentation

## 2019-02-22 DIAGNOSIS — N189 Chronic kidney disease, unspecified: Secondary | ICD-10-CM | POA: Insufficient documentation

## 2019-02-22 DIAGNOSIS — D49519 Neoplasm of unspecified behavior of unspecified kidney: Secondary | ICD-10-CM

## 2019-02-22 LAB — COMPREHENSIVE METABOLIC PANEL
ALT: 10 U/L (ref 0–44)
AST: 17 U/L (ref 15–41)
Albumin: 3 g/dL — ABNORMAL LOW (ref 3.5–5.0)
Alkaline Phosphatase: 136 U/L — ABNORMAL HIGH (ref 38–126)
Anion gap: 8 (ref 5–15)
BUN: 27 mg/dL — ABNORMAL HIGH (ref 8–23)
CO2: 24 mmol/L (ref 22–32)
Calcium: 8.3 mg/dL — ABNORMAL LOW (ref 8.9–10.3)
Chloride: 106 mmol/L (ref 98–111)
Creatinine, Ser: 2.17 mg/dL — ABNORMAL HIGH (ref 0.44–1.00)
GFR calc Af Amer: 24 mL/min — ABNORMAL LOW (ref >60)
GFR calc non Af Amer: 21 mL/min — ABNORMAL LOW (ref >60)
Glucose, Bld: 151 mg/dL — ABNORMAL HIGH (ref 70–99)
Potassium: 3.7 mmol/L (ref 3.5–5.1)
Sodium: 138 mmol/L (ref 135–145)
Total Bilirubin: 0.6 mg/dL (ref 0.3–1.2)
Total Protein: 6.4 g/dL — ABNORMAL LOW (ref 6.5–8.1)

## 2019-02-22 LAB — CBC WITH DIFFERENTIAL/PLATELET
Abs Immature Granulocytes: 0.05 10*3/uL (ref 0.00–0.07)
Basophils Absolute: 0.1 10*3/uL (ref 0.0–0.1)
Basophils Relative: 1 %
Eosinophils Absolute: 0.4 10*3/uL (ref 0.0–0.5)
Eosinophils Relative: 4 %
HCT: 32.5 % — ABNORMAL LOW (ref 36.0–46.0)
Hemoglobin: 10.4 g/dL — ABNORMAL LOW (ref 12.0–15.0)
Immature Granulocytes: 1 %
Lymphocytes Relative: 19 %
Lymphs Abs: 1.8 10*3/uL (ref 0.7–4.0)
MCH: 27.3 pg (ref 26.0–34.0)
MCHC: 32 g/dL (ref 30.0–36.0)
MCV: 85.3 fL (ref 80.0–100.0)
Monocytes Absolute: 0.7 10*3/uL (ref 0.1–1.0)
Monocytes Relative: 8 %
Neutro Abs: 6.5 10*3/uL (ref 1.7–7.7)
Neutrophils Relative %: 67 %
Platelets: 225 10*3/uL (ref 150–400)
RBC: 3.81 MIL/uL — ABNORMAL LOW (ref 3.87–5.11)
RDW: 18.1 % — ABNORMAL HIGH (ref 11.5–15.5)
WBC: 9.6 10*3/uL (ref 4.0–10.5)
nRBC: 0 % (ref 0.0–0.2)

## 2019-02-22 LAB — IRON AND TIBC
Iron: 26 ug/dL — ABNORMAL LOW (ref 28–170)
Saturation Ratios: 8 % — ABNORMAL LOW (ref 10.4–31.8)
TIBC: 344 ug/dL (ref 250–450)
UIBC: 318 ug/dL

## 2019-02-22 LAB — FERRITIN: Ferritin: 15 ng/mL (ref 11–307)

## 2019-02-22 LAB — FOLATE: Folate: 27 ng/mL (ref >5.9)

## 2019-02-22 NOTE — Progress Notes (Signed)
No new changes noted today. The patient husband Amy [name]) has verified all medication today.

## 2019-02-23 ENCOUNTER — Telehealth: Payer: Self-pay

## 2019-02-23 DIAGNOSIS — F0281 Dementia in other diseases classified elsewhere with behavioral disturbance: Secondary | ICD-10-CM | POA: Diagnosis not present

## 2019-02-23 DIAGNOSIS — M1990 Unspecified osteoarthritis, unspecified site: Secondary | ICD-10-CM | POA: Diagnosis not present

## 2019-02-23 DIAGNOSIS — N189 Chronic kidney disease, unspecified: Secondary | ICD-10-CM | POA: Diagnosis not present

## 2019-02-23 DIAGNOSIS — F411 Generalized anxiety disorder: Secondary | ICD-10-CM | POA: Diagnosis not present

## 2019-02-23 DIAGNOSIS — F5104 Psychophysiologic insomnia: Secondary | ICD-10-CM | POA: Diagnosis not present

## 2019-02-23 DIAGNOSIS — G546 Phantom limb syndrome with pain: Secondary | ICD-10-CM | POA: Diagnosis not present

## 2019-02-23 DIAGNOSIS — C169 Malignant neoplasm of stomach, unspecified: Secondary | ICD-10-CM | POA: Diagnosis not present

## 2019-02-23 DIAGNOSIS — G309 Alzheimer's disease, unspecified: Secondary | ICD-10-CM | POA: Diagnosis not present

## 2019-02-23 DIAGNOSIS — F0151 Vascular dementia with behavioral disturbance: Secondary | ICD-10-CM | POA: Diagnosis not present

## 2019-02-23 LAB — VITAMIN B12: Vitamin B-12: 298 pg/mL (ref 180–914)

## 2019-02-23 NOTE — Telephone Encounter (Signed)
Left a message to infrom Mr Amy [name] that the patient she is Iron and B-12 def. Per Dr Mike Gip she would like for the patient to start oral B-12 1000 mcg and Ferrous sulfate 325 1 tab po daily with OJ or vitamin c.[name]

## 2019-02-23 NOTE — Telephone Encounter (Signed)
-----   Message from Lequita Asal, MD sent at 02/23/2019  6:46 AM EDT -----  Please call patient's husband.  She is B12 and iron deficient.  Begin B12 1000 mcg po q day.  Try oral iron 1 tablet a day with OJ or vitamin C.  If tolerates, increase to 1 tablet BID. If unable to tolerate oral iron, will pursue IV iron.  M  ----- Message ----- From: Buel Ream, [facility] [place] Sent: 02/22/2019   2:20 PM EDT To: Lequita Asal, MD

## 2019-02-26 DIAGNOSIS — F5104 Psychophysiologic insomnia: Secondary | ICD-10-CM | POA: Diagnosis not present

## 2019-02-26 DIAGNOSIS — F0281 Dementia in other diseases classified elsewhere with behavioral disturbance: Secondary | ICD-10-CM | POA: Diagnosis not present

## 2019-02-26 DIAGNOSIS — F0151 Vascular dementia with behavioral disturbance: Secondary | ICD-10-CM | POA: Diagnosis not present

## 2019-02-26 DIAGNOSIS — G546 Phantom limb syndrome with pain: Secondary | ICD-10-CM | POA: Diagnosis not present

## 2019-02-26 DIAGNOSIS — N189 Chronic kidney disease, unspecified: Secondary | ICD-10-CM | POA: Diagnosis not present

## 2019-02-26 DIAGNOSIS — F411 Generalized anxiety disorder: Secondary | ICD-10-CM | POA: Diagnosis not present

## 2019-02-26 DIAGNOSIS — M1990 Unspecified osteoarthritis, unspecified site: Secondary | ICD-10-CM | POA: Diagnosis not present

## 2019-02-26 DIAGNOSIS — G309 Alzheimer's disease, unspecified: Secondary | ICD-10-CM | POA: Diagnosis not present

## 2019-02-26 DIAGNOSIS — C169 Malignant neoplasm of stomach, unspecified: Secondary | ICD-10-CM | POA: Diagnosis not present

## 2019-02-27 NOTE — Progress Notes (Signed)
[facility]  [address], [address] [place], [place] [address] Phone: [phone]  Fax: [phone]   Clinic Day:  03/01/2019  Referring physician: [name], FNP  Chief Complaint: Amy [name] is a 83 y.o. female with clinical stage I gastric adenocarcinoma (2020),a history of stage III renal cell carcinoma(2016), and iron deficiency anemia  who is seen for review of PET scan and discussion regarding direction of therapy.  HPI: The patient was last seen in the medical oncology clinic on 02/22/2019.  At that time, she denied any concerns.  Chest CT on 02/15/2019 revealed increasing lung nodules and an expansile lytic lesion in the lateral left 8th rib.  She was not a candidate for bronchoscopy.  We discussed PET scan.  Labs on 02/22/2019 included hematocrit 32.5, hemoglobin 10.4, MCV 85.3, platelets 225,000, WBC 9,600. Ferritin was 15 with an iron saturation of 8% and a TIBC of 344. BUN 27, creatinine 2.17, calcium 8.3, total protein 6.4, albumin 3.0, alkaline phosphatase 136. Vitamin B12 was 298. Folate was 27.0.   It was recommended that she start oral B-12 1,000 mcg and ferrous sulfate 325 1 tab po daily with orange juice or vitamin C on 02/23/2019.   PET scan on 02/28/2019 revealed focal hypermetabolism along the posterior gastric cardia, corresponding to the patient's known gastric cancer, improved.  There was an expansile lytic lesion involving the left lateral 8th rib, compatible with metastasis. There was small pulmonary nodules in the anterior right upper lobe and medial left lower lobe, grossly unchanged in size but suspicious for metastatic disease. There were small mediastinal lymph nodes, mildly improved in size but suspicious for nodal metastases. There was a pleural-based lesion in the posterior right lower lobe favored rounded atelectasis/subpleural scarring despite mild hypermetabolism.   During the interim, she has felt "nervous". Her  husband notes she is taking oral B-12 1,000 mcg and ferrous sulfate 325 1 tab daily with orange juice. I discussed IV iron if oral iron does not help. I encouraged her to eat more iron rich foods. She denies any pain today.  Her husband states she has received her flu shot.    Past Medical History:  Diagnosis Date   Anemia    low iron   Anxiety    Arthritis    Bilateral cataracts    Bronchitis    hx of    Cataract    Chronic kidney disease    Depression    Diabetes mellitus without complication (HCC)    DVT (deep venous thrombosis) (HCC)    hx of in left leg currently has left AKA    Emphysema of lung (HCC)    GERD (gastroesophageal reflux disease)    Headache    History of frequent urinary tract infections    Hx of renal cell cancer    LEFT   Hyperlipidemia    Hypertension    Numbness and tingling    right hand    Obesity    Osteopenia 10/05/2017   Peripheral artery disease (HCC)    Urinary frequency    Urinary incontinence     Past Surgical History:  Procedure Laterality Date   ABDOMINAL HYSTERECTOMY  1978   CARPAL TUNNEL RELEASE Bilateral    CHOLECYSTECTOMY     COLONOSCOPY     COLONOSCOPY WITH ESOPHAGOGASTRODUODENOSCOPY (EGD)     ERCP N/A 08/19/2017   Procedure: ENDOSCOPIC RETROGRADE CHOLANGIOPANCREATOGRAPHY (ERCP);  Surgeon: Arta Silence, MD;  Location: [facility] ENDOSCOPY;  Service: Endoscopy;  Laterality: N/A;  ESOPHAGOGASTRODUODENOSCOPY (EGD) WITH PROPOFOL N/A 10/12/2018   Procedure: ESOPHAGOGASTRODUODENOSCOPY (EGD) WITH PROPOFOL;  Surgeon: Virgel Manifold, MD;  Location: [facility] ENDOSCOPY;  Service: Endoscopy;  Laterality: N/A;   LEG AMPUTATION     above the knee / left    ROBOT ASSISTED LAPAROSCOPIC NEPHRECTOMY Left 01/15/2015   Procedure: ROBOTIC ASSISTED LAPAROSCOPIC RADICAL NEPHRECTOMY;  Surgeon: Alexis Frock, MD;  Location: [facility] ORS;  Service: Urology;  Laterality: Left;   stent placement right leg       Family History    Problem Relation Age of Onset   Cancer Mother        Stomach   Cancer Father    Diabetes Brother    Cancer Brother        oral   Healthy Son    Healthy Sister     Social History:  reports that she quit smoking about 14 years ago. Her smoking use included cigarettes. She has a 76.50 pack-year smoking history. She has never used smokeless tobacco. She reports previous alcohol use. She reports that she does not use drugs. She was an Therapist, sports. [place] Herhusband's name isRaymond. She isaccompanied by her husbandvia Ipadand phone [phone], who is her primary historian do to her dementia.  Allergies:  Allergies  Allergen Reactions   Fentanyl Other (See Comments)    urine retention   Penicillins Itching, Rash and Other (See Comments)    Has patient had a PCN reaction causing immediate rash, facial/tongue/throat swelling, SOB or lightheadedness with hypotension: Yes Has patient had a PCN reaction causing severe rash involving mucus membranes or skin necrosis: No Has patient had a PCN reaction that required hospitalization: No Has patient had a PCN reaction occurring within the last 10 years: Yes If all of the above answers are "NO", then may proceed with Cephalosporin use.    Current Medications: Current Outpatient Medications  Medication Sig Dispense Refill   acetaminophen (TYLENOL) 325 MG tablet Take 325 mg by mouth every 6 (six) hours as needed.     Ascorbic Acid (VITAMIN C) 1000 MG tablet Take 1,000 mg by mouth daily.     atorvastatin (LIPITOR) 20 MG tablet Take 1 tablet (20 mg total) by mouth daily at 6 PM. 90 tablet 3   clopidogrel (PLAVIX) 75 MG tablet TAKE 1 TABLET EVERY DAY (Patient taking differently: Take 75 mg by mouth daily. ) 90 tablet 3   donepezil (ARICEPT) 10 MG tablet Take 1 tablet (10 mg total) by mouth at bedtime. 90 tablet 3   famotidine (PEPCID) 20 MG tablet Take 1 tablet (20 mg total) by mouth daily. 90 tablet 3   ferrous  sulfate 325 (65 FE) MG tablet Take 325 mg by mouth daily with breakfast. Pt taking every other day     FLUoxetine (PROZAC) 10 MG capsule Take 1 capsule (10 mg total) by mouth daily. 30 capsule 0   gabapentin (NEURONTIN) 300 MG capsule Take 1 capsule (300 mg total) by mouth 3 (three) times daily. 270 capsule 3   [START ON 03/07/2019] HYDROcodone-acetaminophen (NORCO) 7.5-325 MG tablet Take 1 tablet by mouth every 8 (eight) hours as needed for severe pain. Must last 30 days. 90 tablet 0   ipratropium-albuterol (DUONEB) 0.5-2.5 (3) MG/3ML SOLN INHALE THE CONTENTS OF 1 VIAL VIA NEBULIZER THREE TIMES DAILY AS DIRECTED 810 mL 2   QUEtiapine (SEROQUEL) 25 MG tablet TAKE 1 TABLET EVERY MORNING, 1 TABLET EVERY AFTERNOON AND 2 TABLETS BEFORE BEDTIME (Patient taking differently: TAKE 25 mg  EVERY  MORNING, take 75 mg  BEFORE BEDTIME) 150 tablet 1   Thiamine HCl (VITAMIN B-1) 250 MG tablet Take 250 mg by mouth daily.     albuterol (VENTOLIN HFA) 108 (90 Base) MCG/ACT inhaler Inhale 1-2 puffs into the lungs every 4 (four) hours as needed.      pantoprazole (PROTONIX) 40 MG tablet Take 1 tablet (40 mg total) by mouth daily. 90 tablet 1   No current facility-administered medications for this visit.     Review of Systems  Constitutional: Negative for chills, diaphoresis, fever, malaise/fatigue and weight loss (no new weight).       Feels nervous.  HENT: Positive for hearing loss. Negative for congestion, ear pain, nosebleeds, sinus pain, sore throat and tinnitus.   Eyes: Negative.  Negative for blurred vision, double vision and photophobia.  Respiratory: Negative for cough (chronic), hemoptysis, sputum production, shortness of breath and wheezing.        On 2 liter/min oxygen.  Cardiovascular: Negative.  Negative for chest pain, palpitations and leg swelling.  Gastrointestinal: Negative.  Negative for abdominal pain, blood in stool, constipation, diarrhea, melena, nausea and vomiting.  Genitourinary:  Negative.  Negative for dysuria, frequency, hematuria and urgency.  Musculoskeletal: Negative.  Negative for back pain, joint pain, myalgias and neck pain.  Skin: Negative.  Negative for itching and rash.  Neurological: Positive for weakness (generalized). Negative for dizziness, tingling, sensory change, speech change, focal weakness and headaches.  Endo/Heme/Allergies: Negative.  Does not bruise/bleed easily.  Psychiatric/Behavioral: Positive for memory loss (dementia). Negative for depression. The patient is nervous/anxious. The patient does not have insomnia.   All other systems reviewed and are negative.  Performance status (ECOG): 2-3  Vitals Blood pressure (!) 119/52, pulse 90, temperature 97.7 F (36.5 C), temperature source Tympanic, resp. rate 20, SpO2 100 %.  Physical Exam  Constitutional: She is oriented to person, place, and time. She appears well-developed and well-nourished. No distress.  Patient sitting comfortably in a wheelchair.  HENT:  Head: Normocephalic and atraumatic.  Mouth/Throat: Oropharynx is clear and moist.  Short dark gray hair.  Eyes: Conjunctivae and EOM are normal. No scleral icterus.  Glasses. Brown eyes.  Musculoskeletal:     Comments: Left AKA.  Neurological: She is alert and oriented to person, place, and time.  Dementia.  Skin: She is not diaphoretic.  Psychiatric: She has a normal mood and affect. Her behavior is normal. Judgment and thought content normal. Cognition and memory are impaired.  Nursing note and vitals reviewed.   Imaging studies: 11/29/2014: Abdomen and pelvic CT revealed a 5.5 x 3.7 solid enhancing mass arising from the upper pole of the left kidney. There was evidence of renal vein invasion. There were borderline enlarged periaortic lymph node (9 mm).  08/16/2016: Chest, abdomen, and pelvic CTrevealed left nephrectomy without evidence of metastatic disease. There were low-attenuation lesions in the right kidney, likely  cysts although definitive characterization is limited without post-contrast imaging. There was an ectatic abdominal aorta at risk for aneurysm development.  02/16/2017: Chest, abdomen and pelvic CTrevealed no evidence of recurrent disease. There was a 7 x 5 mm right upper lobe pulmonary nodule (previously 6 x 4 mm). There was a 3 mm (previously 1-2 mm) left lower lobe pulmonary nodule. 08/12/2017: Chest CT revealed no active pulmonary disease. Mild centrilobular emphysema. There was stable appearing pleural-based nodular opacity in the anterior aspect of the right upper lobe measuring 5.6 mm. There was stable 4 mm medial left upper lobe pleural-based opacity is unchanged.  08/12/2017: Abdomen and pelvic CTrevealed a 9 mm hyperdensity along the expected location of the distal common bile duct near the ampulla with dilatation of the CBD to 17 mm. Simple renal cysts of the right kidney were noted with a 1.1 cm hyperdense complex appearing lesion possibly representing a hemorrhagic or proteinaceous cyst. No worrisome features were identified with respect of this complex lesion.  08/14/2017: MR abdomen MRCPrevealed a solitary 9 mm choledocholith in the mid to upper common bile duct. There was mild diffuse intrahepatic biliary ductal dilatation with dilated CBD (13 mm diameter). There was indeterminate small 1.2 cm renal cortical mass in the posterior lower right kidney, incompletely characterized on this noncontrast MRI study, renal neoplasm not excluded. Recommend attention on follow-up MRI (preferred) or CT abdomen without and with IV contrast in 6 months. There was ectatic 2.7 cm infrarenal abdominal aorta. Ectatic abdominal aorta at risk for aneurysm development. Recommend followup by ultrasound in 5 years. 02/13/2018: Chest, abdomen, and pelvic CTrevealed interval growth of 8 mm solid medial left lower lobe pulmonary nodule, cannot exclude enlarging pulmonary metastasis. This nodule was  slightly below PET resolution. There was interval growth of 9 mm medial left upper lobe ground-glass pulmonary nodule. There were no additional potential findings of metastatic disease. There was no evidence of local tumor recurrence in the left nephrectomy bed. There was an ectatic 2.8 cm infrarenal abdominal aorta, stable at risk for aneurysm development.  05/26/2018: Chest CTrevealed further enlargement of part solid left upper lobe nodule, potentially adenocarcinoma. Left upper lobe measured 12 x 8 mm (previously 9 mm maximally). The left lower lobe solid nodule had not significantly changed over the last 3 months, although had enlarged over the last 10 months and could reflect a metastasis. There was new mild right paratracheal lymphadenopathy. Consider PET-CT for further evaluation. Alternatively, continued short-term CT follow-up could be performed. 08/24/2018: PET scanrevealed a2.5 cmpotential lesion in the stomach just below the GE junction has a maximum SUV of 16.2. Therewasalso some accentuated activity in left upper quadrant jejunal loop. There werea variety of mildly enlarged lymph nodes, some with activity mildly above the background blood pool. Most of the pulmonary nodules hadlow-grade activity (benign versuslow-grade adenocarcinoma). The airspace opacity posteriorly in the aerated portion of the right lower lobe hadlow activity, probably reflecting rounded atelectasis. There wasa bandlike subpleural density in the superior segment right lower lobe whichwasmildly hypermetabolic (SUV 3.1), but with a sub solid appearance and contour atypical for malignancy.There was a small right pleural effusionthat hadaccentuated activity suggesting possible exudative effusion. There was accentuated activity in the left 8th rib laterally(SUV 2.9), favorsmall healing nondisplaced rib fracture whichwas otherwise occult. 02/15/2019:  Chest CT revealed irregular masslike, pleural based  opacity in the posterior right lower lobe without significant change from the most recent chest CT, but increased in size compared to the CT from 06/2018. Given the persistence, infection/inflammation was felt unlikely with neoplastic disease or rounded atelectasis the most likely etiologies. Recommend consultation with pulmonary medicine or cardiothoracic surgery if this had not been previously performed. There was an expansile lytic type bone lesion in the lateral left 8th rib new since 06/2018, concerning for metastatic disease.  There were lung nodules. Nodule in the left lower lobe, 9 mm (increased from 6 mm on the 02/13/2018 CT).  Sub solid nodule, medial left upper lobe, 9 mm (increased from 6 mm on 02/13/2018). Pleural base nodule anterior inferior right upper lobe 11 mm (stable).  There was moderate centrilobular emphysema, and coronary artery  and aortic atherosclerotic calcifications.  There was prominent to mildly enlarged mediastinal lymph nodes were stable from the most recent prior exam and without significant change from 02/13/2018. 02/28/2019:  PET scan revealed focal hypermetabolism along the posterior gastric cardia, corresponding to the patient's known gastric cancer, improved.  There was an expansile lytic lesion involving the left lateral 8th rib, compatible with metastasis. There was small pulmonary nodules in the anterior right upper lobe and medial left lower lobe, grossly unchanged in size but suspicious for metastatic disease. There were small mediastinal lymph nodes, mildly improved in size but suspicious for nodal metastases. There was a pleural-based lesion in the posterior right lower lobe favored rounded atelectasis/subpleural scarring despite mild hypermetabolism.    Hospital Outpatient Visit on 02/28/2019  Component Date Value Ref Range Status   Glucose-Capillary 02/28/2019 89  70 - 99 mg/dL Final    Assessment:  Amy [name] is a 83 y.o. female witha history  ofstage III renal cell carcinoma andclinical stage Igastric carcinoma.  She has a history of stage IIIrenal cell carcinomas/p robotic-assisted laparoscopic left radical nephrectomyon 01/09/2015. Pathology revealed a 6.3 cm renal cell carcinoma. Tumor was predominantly clear cell with a minor component of type I papillary. Fuhrman grade 3 of 4. Tumor extended into the renal vein. There was focal extension into the perirenal and sinus adipose tissue. Margins were negative. There was metastatic tumor in 2 of 2 lymph nodes. Pathologic stagewas T3aN1Mx.   PET scanon 08/24/2018 revealed a2.5 cmpotential lesion in the stomachjust below the GE junction has a maximum SUV of 16.2. There wasalso some accentuated activity in left upper quadrant jejunal loop. There werea variety of mildly enlarged lymph nodes, some with activity mildly above the background blood pool. Most of the pulmonary noduleshadlow-grade activity (benign versuslow-grade adenocarcinoma). The airspace opacity posteriorly in the aerated portion of the right lower lobe hadlow activity, probably reflecting rounded atelectasis. There wasa bandlike subpleural density in the superior segment right lower lobe whichwasmildly hypermetabolic (SUV 3.1), but with a sub solid appearance and contour atypical for malignancy.There was a small right pleural effusionthat hadaccentuated activity suggesting possible exudative effusion. There was accentuated activity in theleft eighth riblaterally (SUV 2.9), favorsmall healing nondisplaced rib fracture whichwas otherwise occult.  EGDon 10/12/2018 revealed normal gastroesophageal junction and esophagus. There was a gastric tumorin the cardia. There was erythematous mucosa in the antrum.Examined duodenum was normal. Pathologyfrom the cardiarevealed adenocarcinoma, moderately differentiated intestinal type, at least intramucosal, and intestinal metaplasia in the stomach mass.  There was also antral and oxyntic mucosa with mild chronic inactive gastritis, negative for H pylori, intestinal metaplasia, dysplasia, or malignancy.  [facility] revealed a mass along the greater curvature of the proximal gastric body. Clinical stagewas T2N0. The mass was not amenable to endoscopic removal. Pathology revealed at leastintramucosal adenocarcinoma arising in a background of intestinal metaplasia and low- and high-grade dysplasia.  Shereceived palliative gastricradiationfrom 11/22/2018 - 12/28/2018.  PET scan on 02/28/2019 revealed focal hypermetabolism along the posterior gastric cardia, corresponding to the patient's known gastric cancer, improved.  There was an expansile lytic lesion involving the left lateral 8th rib, compatible with metastasis. There was small pulmonary nodules in the anterior right upper lobe and medial left lower lobe, grossly unchanged in size but suspicious for metastatic disease. There were small mediastinal lymph nodes, mildly improved in size but suspicious for nodal metastases. There was a pleural-based lesion in the posterior right lower lobe favored rounded atelectasis/subpleural scarring despite mild hypermetabolism.   She underwentERCPon 08/19/2017  for chloedocholithiasis. One pancreatic stent was placed into the ventral pancreatic duct. A filling defect consistent with a stone was seen on the cholangiogram. The entire main bile duct and common hepatic duct were moderately dilated. Choledocholithiasis was found. Complete removal was accomplished by biliary sphincterotomy and basket extraction.  She has iron deficiency anemia. Ferritin was 18 on 08/10/2016, 22 on 09/07/2016, 69 on 12/07/2016, 81 on 02/23/2017, 67 on 08/24/2017, 50 on 02/03/2018, 22 on 03/01/2018, 25 on 05/31/2018, and 15 on 02/22/2019.She denies pica. She is on oral iron.  She has B12 deficiency and folate deficiency.  B12 was 298 on 02/22/2019.  She is  on oral B12.  Folate was 5.5 on 10/24/2018 and 27.0 on 02/22/2019.  She is on folic acid.  She has chronic renal insufficiency. Creatinine was 1.86 (CrCl 26 ml/min) on 01/23/2018and 1.59 on 10/27/2018.CrCl is 26 ml/min.  She has a history of left lower extremity DVT. She is s/p left AKA.  Symptomatically, she denies any new complaint.  Plan: 1.   Clinical stage I gastric carcinoma She is s/p palliative radiation completed on 12/28/2018. Review interim PET scan.  Agree with radiology interpretation.   Focal metabolism along the posterior gastric cardia, improved.   Small pulmonary nodules, small mediastinal nodules, and lytic lesion of 8th rib suspicious for malignancy.   Doubt lesions represent metastatic gastric cancer. 2. Stage III renal cell carcinoma  She is s/pleft radical nephrectomyon 01/09/2015.   Patient had high risk disease.  Review interval PET scan.  Agree with radiology interpretation.   Findings as noted above.   Concern that lytic lesion and pulmonary nodules represent metastatic renal cell carcinoma.  Discuss obtaining tissue for confirmation.   Review with radiology potential biopsy of rib lesion. 3. Pulmonary nodules PET scan as above.  Patient previously seen by Dr Patsey Berthold.  Bronchoscopy is not recommended.             She is felt to be at high risk for invasive procedures of any kind due to her multiple comorbidities, general deconditioning and debility and marginal respiratory status.  4. B12 deficiencyand folate deficiency B12was197(low) and folatewas5.5(low) on06/06/2018 ([facility]). B12 was 298 on 02/22/2019.  B12 goal 400.  Patient on folic acid and oral E09.             Folate 27.0 on 02/22/2019 was normal. 5. Iron deficiency Hematocrit 32.5. Hemoglobin 10.4. MCV 85.3 on 02/22/2019.             Ferritin 15 with an iron saturation of 8% and a TIBC  344. She denies any bleeding. Continue oral iron daily. 6.Tumor board on 03/08/2019. 7.   RTC 3 days after biopsy to discuss direction of therapy.  I discussed the assessment and treatment plan with the patient.  The patient was provided an opportunity to ask questions and all were answered.  The patient agreed with the plan and demonstrated an understanding of the instructions.  The patient was advised to call back if the symptoms worsen or if the condition fails to improve as anticipated.  I provided 15 minutes of face-to-face time during this this encounter and > 50% was spent counseling as documented under my assessment and plan.    Lequita Asal, MD, PhD    03/01/2019, 2:37 PM  I, Selena Batten, am acting as scribe for [employer]. Mike Gip, MD, PhD.  I, Lelon Ikard C. Mike Gip, MD, have reviewed the above documentation for accuracy and completeness, and I agree with the above.

## 2019-02-28 ENCOUNTER — Encounter: Payer: Self-pay | Admitting: Hematology and Oncology

## 2019-02-28 ENCOUNTER — Other Ambulatory Visit: Payer: Self-pay

## 2019-02-28 ENCOUNTER — Encounter
Admission: RE | Admit: 2019-02-28 | Discharge: 2019-02-28 | Disposition: A | Discharge status: Home / Self Care | Payer: Medicare HMO | Source: Ambulatory Visit | Attending: Hematology and Oncology | Admitting: Hematology and Oncology

## 2019-02-28 DIAGNOSIS — E785 Hyperlipidemia, unspecified: Secondary | ICD-10-CM | POA: Diagnosis not present

## 2019-02-28 DIAGNOSIS — Z905 Acquired absence of kidney: Secondary | ICD-10-CM | POA: Diagnosis not present

## 2019-02-28 DIAGNOSIS — C169 Malignant neoplasm of stomach, unspecified: Secondary | ICD-10-CM | POA: Diagnosis not present

## 2019-02-28 DIAGNOSIS — K219 Gastro-esophageal reflux disease without esophagitis: Secondary | ICD-10-CM

## 2019-02-28 DIAGNOSIS — Z87891 Personal history of nicotine dependence: Secondary | ICD-10-CM | POA: Insufficient documentation

## 2019-02-28 DIAGNOSIS — C162 Malignant neoplasm of body of stomach: Secondary | ICD-10-CM | POA: Diagnosis not present

## 2019-02-28 DIAGNOSIS — C649 Malignant neoplasm of unspecified kidney, except renal pelvis: Secondary | ICD-10-CM | POA: Diagnosis not present

## 2019-02-28 DIAGNOSIS — N189 Chronic kidney disease, unspecified: Secondary | ICD-10-CM | POA: Insufficient documentation

## 2019-02-28 DIAGNOSIS — Z79899 Other long term (current) drug therapy: Secondary | ICD-10-CM | POA: Insufficient documentation

## 2019-02-28 DIAGNOSIS — N281 Cyst of kidney, acquired: Secondary | ICD-10-CM | POA: Diagnosis not present

## 2019-02-28 DIAGNOSIS — E1122 Type 2 diabetes mellitus with diabetic chronic kidney disease: Secondary | ICD-10-CM | POA: Diagnosis not present

## 2019-02-28 DIAGNOSIS — C642 Malignant neoplasm of left kidney, except renal pelvis: Secondary | ICD-10-CM | POA: Insufficient documentation

## 2019-02-28 DIAGNOSIS — R918 Other nonspecific abnormal finding of lung field: Secondary | ICD-10-CM | POA: Insufficient documentation

## 2019-02-28 DIAGNOSIS — I129 Hypertensive chronic kidney disease with stage 1 through stage 4 chronic kidney disease, or unspecified chronic kidney disease: Secondary | ICD-10-CM | POA: Insufficient documentation

## 2019-02-28 LAB — GLUCOSE, CAPILLARY: Glucose-Capillary: 89 mg/dL (ref 70–99)

## 2019-02-28 MED ORDER — FLUDEOXYGLUCOSE F - 18 (FDG) INJECTION
8.6600 | Freq: Once | INTRAVENOUS | Status: AC | PRN
  Administered 2019-02-28: 8.66 via INTRAVENOUS

## 2019-02-28 NOTE — Progress Notes (Signed)
No new changes noted today. The patient Name and DOB has been verified by phone today. 

## 2019-03-01 ENCOUNTER — Encounter: Payer: Self-pay | Admitting: Hematology and Oncology

## 2019-03-01 ENCOUNTER — Inpatient Hospital Stay (HOSPITAL_BASED_OUTPATIENT_CLINIC_OR_DEPARTMENT_OTHER): Payer: Medicare HMO | Admitting: Hematology and Oncology

## 2019-03-01 VITALS — BP 119/52 | HR 90 | Temp 97.7°F | Resp 20

## 2019-03-01 DIAGNOSIS — Z7189 Other specified counseling: Secondary | ICD-10-CM

## 2019-03-01 DIAGNOSIS — E1122 Type 2 diabetes mellitus with diabetic chronic kidney disease: Secondary | ICD-10-CM | POA: Diagnosis not present

## 2019-03-01 DIAGNOSIS — D509 Iron deficiency anemia, unspecified: Secondary | ICD-10-CM | POA: Diagnosis not present

## 2019-03-01 DIAGNOSIS — R918 Other nonspecific abnormal finding of lung field: Secondary | ICD-10-CM | POA: Diagnosis not present

## 2019-03-01 DIAGNOSIS — E538 Deficiency of other specified B group vitamins: Secondary | ICD-10-CM

## 2019-03-01 DIAGNOSIS — Z923 Personal history of irradiation: Secondary | ICD-10-CM | POA: Diagnosis not present

## 2019-03-01 DIAGNOSIS — M899 Disorder of bone, unspecified: Secondary | ICD-10-CM | POA: Diagnosis not present

## 2019-03-01 DIAGNOSIS — D49519 Neoplasm of unspecified behavior of unspecified kidney: Secondary | ICD-10-CM

## 2019-03-01 DIAGNOSIS — N189 Chronic kidney disease, unspecified: Secondary | ICD-10-CM | POA: Diagnosis not present

## 2019-03-01 DIAGNOSIS — I129 Hypertensive chronic kidney disease with stage 1 through stage 4 chronic kidney disease, or unspecified chronic kidney disease: Secondary | ICD-10-CM | POA: Diagnosis not present

## 2019-03-01 DIAGNOSIS — C7951 Secondary malignant neoplasm of bone: Secondary | ICD-10-CM | POA: Diagnosis not present

## 2019-03-01 DIAGNOSIS — C169 Malignant neoplasm of stomach, unspecified: Secondary | ICD-10-CM | POA: Diagnosis not present

## 2019-03-01 DIAGNOSIS — C162 Malignant neoplasm of body of stomach: Secondary | ICD-10-CM

## 2019-03-01 LAB — OPIATES,MS,WB/SP RFX
6-Acetylmorphine: NEGATIVE
Codeine: NEGATIVE ng/mL
Dihydrocodeine: 3.9 ng/mL
Hydrocodone: 29.7 ng/mL
Hydromorphone: NEGATIVE ng/mL
Morphine: NEGATIVE ng/mL
Opiate Confirmation: POSITIVE

## 2019-03-01 LAB — DRUG SCREEN 10 W/CONF, SERUM
Amphetamines, IA: NEGATIVE ng/mL
Barbiturates, IA: NEGATIVE ug/mL
Benzodiazepines, IA: NEGATIVE ng/mL
Cocaine & Metabolite, IA: NEGATIVE ng/mL
Methadone, IA: NEGATIVE ng/mL
Opiates, IA: POSITIVE ng/mL — AB
Oxycodones, IA: NEGATIVE ng/mL
Phencyclidine, IA: NEGATIVE ng/mL
Propoxyphene, IA: NEGATIVE ng/mL
THC(Marijuana) Metabolite, IA: NEGATIVE ng/mL

## 2019-03-01 LAB — OXYCODONES,MS,WB/SP RFX
Oxycocone: NEGATIVE ng/mL
Oxycodones Confirmation: NEGATIVE
Oxymorphone: NEGATIVE ng/mL

## 2019-03-01 MED ORDER — PANTOPRAZOLE SODIUM 40 MG PO TBEC: 40.0000 mg | DELAYED_RELEASE_TABLET | Freq: Every day | ORAL | 1 refills | Status: DC

## 2019-03-02 DIAGNOSIS — F5104 Psychophysiologic insomnia: Secondary | ICD-10-CM | POA: Diagnosis not present

## 2019-03-02 DIAGNOSIS — C169 Malignant neoplasm of stomach, unspecified: Secondary | ICD-10-CM | POA: Diagnosis not present

## 2019-03-02 DIAGNOSIS — F0151 Vascular dementia with behavioral disturbance: Secondary | ICD-10-CM | POA: Diagnosis not present

## 2019-03-02 DIAGNOSIS — M1990 Unspecified osteoarthritis, unspecified site: Secondary | ICD-10-CM | POA: Diagnosis not present

## 2019-03-02 DIAGNOSIS — F0281 Dementia in other diseases classified elsewhere with behavioral disturbance: Secondary | ICD-10-CM | POA: Diagnosis not present

## 2019-03-02 DIAGNOSIS — G309 Alzheimer's disease, unspecified: Secondary | ICD-10-CM | POA: Diagnosis not present

## 2019-03-02 DIAGNOSIS — G546 Phantom limb syndrome with pain: Secondary | ICD-10-CM | POA: Diagnosis not present

## 2019-03-02 DIAGNOSIS — N189 Chronic kidney disease, unspecified: Secondary | ICD-10-CM | POA: Diagnosis not present

## 2019-03-02 DIAGNOSIS — J449 Chronic obstructive pulmonary disease, unspecified: Secondary | ICD-10-CM | POA: Diagnosis not present

## 2019-03-02 DIAGNOSIS — F411 Generalized anxiety disorder: Secondary | ICD-10-CM | POA: Diagnosis not present

## 2019-03-03 DIAGNOSIS — J9601 Acute respiratory failure with hypoxia: Secondary | ICD-10-CM | POA: Diagnosis not present

## 2019-03-08 ENCOUNTER — Inpatient Hospital Stay: Payer: Medicare HMO

## 2019-03-08 DIAGNOSIS — G309 Alzheimer's disease, unspecified: Secondary | ICD-10-CM | POA: Diagnosis not present

## 2019-03-08 DIAGNOSIS — F411 Generalized anxiety disorder: Secondary | ICD-10-CM | POA: Diagnosis not present

## 2019-03-08 DIAGNOSIS — F0281 Dementia in other diseases classified elsewhere with behavioral disturbance: Secondary | ICD-10-CM | POA: Diagnosis not present

## 2019-03-08 DIAGNOSIS — N189 Chronic kidney disease, unspecified: Secondary | ICD-10-CM | POA: Diagnosis not present

## 2019-03-08 DIAGNOSIS — M1990 Unspecified osteoarthritis, unspecified site: Secondary | ICD-10-CM | POA: Diagnosis not present

## 2019-03-08 DIAGNOSIS — F5104 Psychophysiologic insomnia: Secondary | ICD-10-CM | POA: Diagnosis not present

## 2019-03-08 DIAGNOSIS — F0151 Vascular dementia with behavioral disturbance: Secondary | ICD-10-CM | POA: Diagnosis not present

## 2019-03-08 DIAGNOSIS — C169 Malignant neoplasm of stomach, unspecified: Secondary | ICD-10-CM | POA: Diagnosis not present

## 2019-03-08 DIAGNOSIS — G546 Phantom limb syndrome with pain: Secondary | ICD-10-CM | POA: Diagnosis not present

## 2019-03-08 DIAGNOSIS — M899 Disorder of bone, unspecified: Secondary | ICD-10-CM | POA: Insufficient documentation

## 2019-03-08 NOTE — Progress Notes (Signed)
  Tumor Board Documentation  Amy [name] was presented by Dr Mike Gip at our Tumor Board on 03/08/2019, which included representatives from medical oncology, radiation oncology, pathology, radiology, surgical, navigation, internal medicine, pharmacy, research, genetics, palliative care, pulmonology.  Amy [name] currently presents as a current patient, for discussion with history of the following treatments: active survellience.  Additionally, we reviewed previous medical and familial history, history of present illness, and recent lab results along with all available histopathologic and imaging studies. The tumor board considered available treatment options and made the following recommendations: Radiation therapy (primary modality)  Patient is not a surgical candidate, refer to Dr Baruch Gouty  The following procedures/referrals were also placed: No orders of the defined types were placed in this encounter.   Clinical Trial Status: not discussed   Staging used:    National site-specific guidelines   were discussed with respect to the case.  Tumor board is a meeting of clinicians from various specialty areas who evaluate and discuss patients for whom a multidisciplinary approach is being considered. Final determinations in the plan of care are those of the provider(s). The responsibility for follow up of recommendations given during tumor board is that of the provider.   Today's extended care, comprehensive team conference, Amy [name] was not present for the discussion and was not examined.   Multidisciplinary Tumor Board is a multidisciplinary case peer review process.  Decisions discussed in the Multidisciplinary Tumor Board reflect the opinions of the specialists present at the conference without having examined the patient.  Ultimately, treatment and diagnostic decisions rest with the primary provider(s) and the patient.

## 2019-03-12 ENCOUNTER — Encounter: Payer: Self-pay | Admitting: Hematology and Oncology

## 2019-03-12 NOTE — Progress Notes (Signed)
Per the patient husband she can't express the pain level. She keep screaming out in pain.

## 2019-03-12 NOTE — Progress Notes (Signed)
[facility]  [address] [place], [place] [address] Phone: [phone]  Fax: [phone]   Telephone Visit:  03/13/2019  Referring physician: [name], FNP  I connected with Amy [name] on 03/13/2019 at 12:23 PM by telephone and verified that I was speaking with the correct person using 2 identifiers.  The patient was at home.  I discussed the limitations, risk, security and privacy concerns of performing an evaluation and management service by telephone and the availability of in person appointments.  I also discussed with the patient that there may be a patient responsible charge related to this service.  The patient expressed understanding and agreed to proceed.   Chief Complaint: Amy [name] is a 83 y.o. female with clinical stage I gastric adenocarcinoma (2020),a history of stage III renal cell carcinoma(2016), and iron deficiency anemia who is seen for 2 week assessment and discussion regarding direction of therapy.   HPI: The patient was last seen in the medical oncology clinic on 03/01/2019. At that time, she denied any new complaints. She continue oral iron daily. We discussed her PET scan which revealed small pulmonary nodules, small mediastinal nodules, and a left lateral lytic lesion of 8th rib suspicious for malignancy.  We discussed biopsy of the rib lesion if feasible.  She was presented at tumor board on 03/08/2019.  She was not felt to be a candidate for biopsy (increased risk for complication).  As she was symptomatic from the rib lesion, recommendation was for local radiation.  During the interim, she has felt "ok".  She describes left foot phantom pain. She denies any swelling. Her husband notes she can not express the pain level.   Past Medical History:  Diagnosis Date  . Anemia    low iron  . Anxiety   . Arthritis   . Bilateral cataracts   . Bronchitis    hx of   . Cataract   . Chronic kidney disease    . Depression   . Diabetes mellitus without complication ([place])   . 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 Emphysema of lung ([place])   . GERD (gastroesophageal reflux disease)   . Headache   . History of frequent urinary tract infections   . Hx of renal cell cancer    LEFT  . Hyperlipidemia   . Hypertension   . Numbness and tingling    right hand   . Obesity   . Osteopenia 10/05/2017  . Peripheral artery disease ([place])   . Urinary frequency   . Urinary incontinence     Past Surgical History:  Procedure Laterality Date  . ABDOMINAL HYSTERECTOMY  1978  . CARPAL TUNNEL RELEASE Bilateral   . CHOLECYSTECTOMY    . COLONOSCOPY    . COLONOSCOPY WITH ESOPHAGOGASTRODUODENOSCOPY (EGD)    . ERCP N/A 08/19/2017   Procedure: ENDOSCOPIC RETROGRADE CHOLANGIOPANCREATOGRAPHY (ERCP);  Surgeon: Arta Silence, MD;  Location: [facility] ENDOSCOPY;  Service: Endoscopy;  Laterality: N/A;  . ESOPHAGOGASTRODUODENOSCOPY (EGD) WITH PROPOFOL N/A 10/12/2018   Procedure: ESOPHAGOGASTRODUODENOSCOPY (EGD) WITH PROPOFOL;  Surgeon: Virgel Manifold, MD;  Location: [facility] ENDOSCOPY;  Service: Endoscopy;  Laterality: N/A;  . LEG AMPUTATION     above the knee / left   . ROBOT ASSISTED LAPAROSCOPIC NEPHRECTOMY Left 01/15/2015   Procedure: ROBOTIC ASSISTED LAPAROSCOPIC RADICAL NEPHRECTOMY;  Surgeon: Alexis Frock, MD;  Location: [facility] ORS;  Service: Urology;  Laterality: Left;  .  stent placement right leg       Family History  Problem Relation Age of Onset  . Cancer Mother        Stomach  . Cancer Father   . Diabetes Brother   . Cancer Brother        oral  . Healthy Son   . Healthy Sister     Social History:  reports that she quit smoking about 14 years ago. Her smoking use included cigarettes. She has a 76.50 pack-year smoking history. She has never used smokeless tobacco. She reports previous alcohol use. She reports that she does not use drugs. She was an Therapist, sports. She lives  in [place]. Herhusband's name isRaymond. She isaccompanied by her husband ([phone])today, who is her primary historian do to her dementia.  Participants in the patient's visit and their role in the encounter included the patient, her husband Amy [name]), and Amy [name], CMA, today.  The intake visit was provided by Amy [name], CMA.  Allergies:  Allergies  Allergen Reactions  . Fentanyl Other (See Comments)    urine retention  . Penicillins Itching, Rash and Other (See Comments)    Has patient had a PCN reaction causing immediate rash, facial/tongue/throat swelling, SOB or lightheadedness with hypotension: Yes Has patient had a PCN reaction causing severe rash involving mucus membranes or skin necrosis: No Has patient had a PCN reaction that required hospitalization: No Has patient had a PCN reaction occurring within the last 10 years: Yes If all of the above answers are "NO", then may proceed with Cephalosporin use.    Current Medications: Current Outpatient Medications  Medication Sig Dispense Refill  . acetaminophen (TYLENOL) 325 MG tablet Take 325 mg by mouth every 6 (six) hours as needed.    . Ascorbic Acid (VITAMIN C) 1000 MG tablet Take 1,000 mg by mouth daily.    [name] atorvastatin (LIPITOR) 20 MG tablet Take 1 tablet (20 mg total) by mouth daily at 6 PM. 90 tablet 3  . clopidogrel (PLAVIX) 75 MG tablet TAKE 1 TABLET EVERY DAY (Patient taking differently: Take 75 mg by mouth daily. ) 90 tablet 3  . donepezil (ARICEPT) 10 MG tablet Take 1 tablet (10 mg total) by mouth at bedtime. 90 tablet 3  . famotidine (PEPCID) 20 MG tablet Take 1 tablet (20 mg total) by mouth daily. 90 tablet 3  . ferrous sulfate 325 (65 FE) MG tablet Take 325 mg by mouth daily with breakfast. Pt taking every other day    . FLUoxetine (PROZAC) 10 MG capsule Take 1 capsule (10 mg total) by mouth daily. 30 capsule 0  . HYDROcodone-acetaminophen (NORCO) 7.5-325 MG tablet Take 1 tablet by mouth  every 8 (eight) hours as needed for severe pain. Must last 30 days. 90 tablet 0  . ipratropium-albuterol (DUONEB) 0.5-2.5 (3) MG/3ML SOLN INHALE THE CONTENTS OF 1 VIAL VIA NEBULIZER THREE TIMES DAILY AS DIRECTED 810 mL 2  . pantoprazole (PROTONIX) 40 MG tablet Take 1 tablet (40 mg total) by mouth daily. 90 tablet 1  . QUEtiapine (SEROQUEL) 25 MG tablet TAKE 1 TABLET EVERY MORNING, 1 TABLET EVERY AFTERNOON AND 2 TABLETS BEFORE BEDTIME (Patient taking differently: TAKE 25 mg  EVERY MORNING, take 75 mg  BEFORE BEDTIME) 150 tablet 1  . Thiamine HCl (VITAMIN B-1) 250 MG tablet Take 250 mg by mouth daily.    [name] albuterol (VENTOLIN HFA) 108 (90 Base) MCG/ACT inhaler Inhale 1-2 puffs into the lungs every 4 (four) hours as needed.     [name]  gabapentin (NEURONTIN) 300 MG capsule Take 1 capsule (300 mg total) by mouth 3 (three) times daily. 270 capsule 3   No current facility-administered medications for this visit.      Review of Systems  Constitutional: Positive for weight loss (no new weight). Negative for chills, diaphoresis, fever and malaise/fatigue.       Feels "ok".  HENT: Positive for hearing loss. Negative for congestion, ear pain, nosebleeds, sinus pain, sore throat and tinnitus.   Eyes: Negative.  Negative for blurred vision, double vision and photophobia.  Respiratory: Negative for cough (chronic), hemoptysis, sputum production, shortness of breath and wheezing.        On 2 liter/min oxygen via [place].  Cardiovascular: Negative.  Negative for chest pain, palpitations and leg swelling.  Gastrointestinal: Negative.  Negative for abdominal pain, blood in stool, constipation, diarrhea, melena, nausea and vomiting.  Genitourinary: Negative.  Negative for dysuria, frequency, hematuria and urgency.  Musculoskeletal: Negative.  Negative for back pain, joint pain, myalgias and neck pain.       Left foot phantom pain.  Skin: Negative.  Negative for itching and rash.  Neurological: Positive for weakness  (generalized). Negative for dizziness, tingling, sensory change, speech change, focal weakness and headaches.  Endo/Heme/Allergies: Negative.  Does not bruise/bleed easily.  Psychiatric/Behavioral: Positive for memory loss (dementia). Negative for depression. The patient is not nervous/anxious and does not have insomnia.   All other systems reviewed and are negative.   Performance status (ECOG): 2-3     Imaging studies: 11/29/2014: Abdomen and pelvic CT revealed a 5.5 x 3.7 solid enhancing mass arising from the upper pole of the left kidney. There was evidence of renal vein invasion. There were borderline enlarged periaortic lymph node (9 mm).  08/16/2016: Chest, abdomen, and pelvic CTrevealed left nephrectomy without evidence of metastatic disease. There were low-attenuation lesions in the right kidney, likely cysts although definitive characterization is limited without post-contrast imaging. There was an ectatic abdominal aorta at risk for aneurysm development.  02/16/2017: Chest, abdomen and pelvic CTrevealed no evidence of recurrent disease. There was a 7 x 5 mm right upper lobe pulmonary nodule (previously 6 x 4 mm). There was a 3 mm (previously 1-2 mm) left lower lobe pulmonary nodule. 08/12/2017: Chest CT revealed no active pulmonary disease. Mild centrilobular emphysema. There was stable appearing pleural-based nodular opacity in the anterior aspect of the right upper lobe measuring 5.6 mm. There was stable 4 mm medial left upper lobe pleural-based opacity is unchanged.  08/12/2017: Abdomen and pelvic CTrevealed a 9 mm hyperdensity along the expected location of the distal common bile duct near the ampulla with dilatation of the CBD to 17 mm. Simple renal cysts of the right kidney were noted with a 1.1 cm hyperdense complex appearing lesion possibly representing a hemorrhagic or proteinaceous cyst. No worrisome features were identified with respect of this complex lesion.   08/14/2017: MR abdomen MRCPrevealed a solitary 9 mm choledocholith in the mid to upper common bile duct. There was mild diffuse intrahepatic biliary ductal dilatation with dilated CBD (13 mm diameter). There was indeterminate small 1.2 cm renal cortical mass in the posterior lower right kidney, incompletely characterized on this noncontrast MRI study, renal neoplasm not excluded. Recommend attention on follow-up MRI (preferred) or CT abdomen without and with IV contrast in 6 months. There was ectatic 2.7 cm infrarenal abdominal aorta. Ectatic abdominal aorta at risk for aneurysm development. Recommend followup by ultrasound in 5 years. 02/13/2018: Chest, abdomen, and pelvic CTrevealed interval growth of  8 mm solid medial left lower lobe pulmonary nodule, cannot exclude enlarging pulmonary metastasis. This nodule was slightly below PET resolution. There was interval growth of 9 mm medial left upper lobe ground-glass pulmonary nodule. There were no additional potential findings of metastatic disease. There was no evidence of local tumor recurrence in the left nephrectomy bed. There was an ectatic 2.8 cm infrarenal abdominal aorta, stable at risk for aneurysm development.  05/26/2018: Chest CTrevealed further enlargement of part solid left upper lobe nodule, potentially adenocarcinoma. Left upper lobe measured 12 x 8 mm (previously 9 mm maximally). The left lower lobe solid nodule had not significantly changed over the last 3 months, although had enlarged over the last 10 months and could reflect a metastasis. There was new mild right paratracheal lymphadenopathy. Consider PET-CT for further evaluation. Alternatively, continued short-term CT follow-up could be performed. 08/24/2018: PET scanrevealed a2.5 cmpotential lesion in the stomach just below the GE junction has a maximum SUV of 16.2. Therewasalso some accentuated activity in left upper quadrant jejunal loop. There werea variety of  mildly enlarged lymph nodes, some with activity mildly above the background blood pool. Most of the pulmonary nodules hadlow-grade activity (benign versuslow-grade adenocarcinoma). The airspace opacity posteriorly in the aerated portion of the right lower lobe hadlow activity, probably reflecting rounded atelectasis. There wasa bandlike subpleural density in the superior segment right lower lobe whichwasmildly hypermetabolic (SUV 3.1), but with a sub solid appearance and contour atypical for malignancy.There was a small right pleural effusionthat hadaccentuated activity suggesting possible exudative effusion. There was accentuated activity in the left8thrib laterally(SUV 2.9), favorsmall healing nondisplaced rib fracture whichwas otherwise occult. 02/15/2019:Chest CTrevealed irregular masslike, pleural based opacity in the posterior right lower lobe without significant change from the most recent chest CT, but increased in size compared to the CT from02/2020. Given the persistence, infection/inflammationwas felt unlikely with neoplastic disease or rounded atelectasis the most likely etiologies. Recommend consultation with pulmonary medicine or cardiothoracic surgery if this hadnot been previously performed. There was an expansile lytic type bone lesion in the lateral left8thrib new since 06/2018, concerning for metastatic disease.There were lung nodules. Nodule in the left lower lobe, 9 mm(increased from 6 mm on the09/23/2019CT). Sub solid nodule, medial left upperlobe, 9 mm(increased from 6 mm on 02/13/2018). Pleural base nodule anterior inferior right upper lobe 11 mm(stable).There was moderate centrilobular emphysema, and coronary artery and aortic atherosclerotic calcifications. There was prominent to mildly enlarged mediastinal lymph nodeswere stable from the most recent prior exam and without significant change from 02/13/2018. 02/28/2019:  PET scan revealed focal  hypermetabolism along the posterior gastric cardia, corresponding to the patient's known gastric cancer, improved.  There was an expansile lytic lesion involving the left lateral 8th rib, compatible with metastasis. There was small pulmonary nodules in the anterior right upper lobe and medial left lower lobe, grossly unchanged in size but suspicious for metastatic disease. There were small mediastinal lymph nodes, mildly improved in size but suspicious for nodal metastases. There was a pleural-based lesion in the posterior right lower lobe favored rounded atelectasis/subpleural scarring despite mild hypermetabolism.    No visits with results within 3 Day(s) from this visit.  Latest known visit with results is:  Hospital Outpatient Visit on 02/28/2019  Component Date Value Ref Range Status  . Glucose-Capillary 02/28/2019 89  70 - 99 mg/dL Final    Assessment:  Amy [name] is a 83 y.o. female witha history ofstage III renal cell carcinoma andclinical stage Igastric carcinoma.  She has a history  of stage IIIrenal cell carcinomas/p robotic-assisted laparoscopic left radical nephrectomyon 01/09/2015. Pathology revealed a 6.3 cm renal cell carcinoma. Tumor was predominantly clear cell with a minor component of type I papillary. Fuhrman grade 3 of 4. Tumor extended into the renal vein. There was focal extension into the perirenal and sinus adipose tissue. Margins were negative. There was metastatic tumor in 2 of 2 lymph nodes. Pathologic stagewas T3aN1Mx.   PET scanon 08/24/2018 revealed a2.5 cmpotential lesion in the stomachjust below the GE junction has a maximum SUV of 16.2. There wasalso some accentuated activity in left upper quadrant jejunal loop. There werea variety of mildly enlarged lymph nodes, some with activity mildly above the background blood pool. Most of the pulmonary noduleshadlow-grade activity (benign versuslow-grade adenocarcinoma). The airspace  opacity posteriorly in the aerated portion of the right lower lobe hadlow activity, probably reflecting rounded atelectasis. There wasa bandlike subpleural density in the superior segment right lower lobe whichwasmildly hypermetabolic (SUV 3.1), but with a sub solid appearance and contour atypical for malignancy.There was a small right pleural effusionthat hadaccentuated activity suggesting possible exudative effusion. There was accentuated activity in theleft eighth riblaterally (SUV 2.9), favorsmall healing nondisplaced rib fracture whichwas otherwise occult.  [facility]Don 10/12/2018 revealed normal gastroesophageal junction and esophagus. There was a gastric tumorin the cardia. There was erythematous mucosa in the antrum.Examined duodenum was normal. Pathologyfrom the cardiarevealed adenocarcinoma, moderately differentiated intestinal type, at least intramucosal, and intestinal metaplasia in the stomach mass. There was also antral and oxyntic mucosa with mild chronic inactive gastritis, negative for H pylori, intestinal metaplasia, dysplasia, or malignancy.  [facility] revealed a mass along the greater curvature of the proximal gastric body. Clinical stagewas T2N0. The mass was not amenable to endoscopic removal. Pathology revealed at leastintramucosal adenocarcinoma arising in a background of intestinal metaplasia and low- and high-grade dysplasia.  Shereceived palliative gastricradiationfrom 11/22/2018 - 12/28/2018.  PET scan on 02/28/2019 revealed focal hypermetabolism along the posterior gastric cardia, corresponding to the patient's known gastric cancer, improved.  There was an expansile lytic lesion involving the left lateral 8th rib, compatible with metastasis. There was small pulmonary nodules in the anterior right upper lobe and medial left lower lobe, grossly unchanged in size but suspicious for metastatic disease. There were small mediastinal lymph nodes,  mildly improved in size but suspicious for nodal metastases. There was a pleural-based lesion in the posterior right lower lobe favored rounded atelectasis/subpleural scarring despite mild hypermetabolism.   She underwentERCPon 08/19/2017 for chloedocholithiasis. One pancreatic stent was placed into the ventral pancreatic duct. A filling defect consistent with a stone was seen on the cholangiogram. The entire main bile duct and common hepatic duct were moderately dilated. Choledocholithiasis was found. Complete removal was accomplished by biliary sphincterotomy and basket extraction.  She has iron deficiency anemia. Ferritin was 18 on 08/10/2016, 22 on 09/07/2016, 69 on 12/07/2016, 81 on 02/23/2017, 67 on 08/24/2017, 50 on 02/03/2018, 22 on 03/01/2018, 25 on 05/31/2018, and 15 on 02/22/2019.She denies pica. She is on oral iron.  She has B12 deficiency and folate deficiency.  B12 was 298 on 02/22/2019.  She is on oral B12.  Folate was 5.5 on 10/24/2018 and 27.0 on 02/22/2019.  She is on folic acid.  She has chronic renal insufficiency. Creatinine was 1.86 (CrCl 26 ml/min) on 01/23/2018and 1.59 on 10/27/2018.CrCl is 26 ml/min.  She has a history of left lower extremity DVT. She is s/p left AKA.  Symptomatically, she notes left lower extremity phantom pain.  She currently denies left  rib pain.  Plan: 1.   Clinicalstage Igastric carcinoma She is s/ppalliative radiation completed on 12/28/2018. PET scan on 02/28/2019 revealed focal metabolism along the posterior gastric cardia, improved.  Continue to monitor. 2. Stage III renal cell carcinoma  Sheis s/pleft radical nephrectomyon 01/09/2015.                         Patient had high risk disease.             PET scan on 02/28/2019 revealed small pulmonary nodules, small mediastinal nodules, and lytic lesion of 8th rib suspicious for malignancy.   Lesions may represent renal cell carcinoma.    Unable to safely biopsy rib lesion. 3. Lytic lesion of left 8th rib  Review tumor board discussion.  As lesion new and likely represents metastatic disease, recommendation for local radiation.  Patient has had intermittent left rib pain.  Discuss referral to radiation oncology.  Patient and husband agree to referral. 4.   Pulmonary nodules PET scan on 02/28/2019 revealed small pulmonary nodules.  Dr Patsey Berthold notes bronchoscopy high risk.  Rib biopsy is high risk.  Discuss plan for ongoing surveillance. 5. B12 deficiencyand folate deficiency B12was197(low) and folatewas5.5(low) on06/06/2018 ([facility]). B12 was 298 on 02/22/2019.  B12 goal 400. Folate 27.0 on 02/22/2019 was normal.  Patient on oral W29 and folic acid.  Check B12 level after 1 month of oral B12.   If B12 remains low, begin B12 injections. 6. Iron deficiency Hematocrit 32.5. Hemoglobin 10.4. MCV85.3 on 02/22/2019. Ferritin 15 with an iron saturation of 8% and TIBC 344.  Patient denies bleeding.  Continue oral iron. 7.Consult radiation oncology. 8.   Patient to call after completion of radiation for ongoing follow-up.   I discussed the assessment and treatment plan with the patient.  The patient was provided an opportunity to ask questions and all were answered.  The patient agreed with the plan and demonstrated an understanding of the instructions.  The patient was advised to call back or seek an in person evaluation if the symptoms worsen or if the condition fails to improve as anticipated.  I provided 12 minutes (12:23 PM - 12:34 PM) of non face-to-face video visit time during this this encounter and > 50% was spent counseling as documented under my assessment and plan.  I provided these services from the [facility] office.   Nolon Stalls, MD, PhD  03/13/2019, 12:23 PM  I, Selena Batten, am acting as scribe for  [employer]. Mike Gip, MD, PhD.  I, Melissa C. Mike Gip, MD, have reviewed the above documentation for accuracy and completeness, and I agree with the above.

## 2019-03-13 ENCOUNTER — Encounter: Payer: Self-pay | Admitting: Hematology and Oncology

## 2019-03-13 ENCOUNTER — Telehealth: Payer: Self-pay | Admitting: Family Medicine

## 2019-03-13 ENCOUNTER — Inpatient Hospital Stay (HOSPITAL_BASED_OUTPATIENT_CLINIC_OR_DEPARTMENT_OTHER): Payer: Medicare HMO | Admitting: Hematology and Oncology

## 2019-03-13 DIAGNOSIS — C642 Malignant neoplasm of left kidney, except renal pelvis: Secondary | ICD-10-CM | POA: Diagnosis not present

## 2019-03-13 DIAGNOSIS — E538 Deficiency of other specified B group vitamins: Secondary | ICD-10-CM | POA: Diagnosis not present

## 2019-03-13 DIAGNOSIS — F419 Anxiety disorder, unspecified: Secondary | ICD-10-CM

## 2019-03-13 DIAGNOSIS — C7951 Secondary malignant neoplasm of bone: Secondary | ICD-10-CM | POA: Diagnosis not present

## 2019-03-13 DIAGNOSIS — Z7189 Other specified counseling: Secondary | ICD-10-CM

## 2019-03-13 DIAGNOSIS — C162 Malignant neoplasm of body of stomach: Secondary | ICD-10-CM | POA: Diagnosis not present

## 2019-03-13 DIAGNOSIS — F3342 Major depressive disorder, recurrent, in full remission: Secondary | ICD-10-CM

## 2019-03-13 NOTE — Telephone Encounter (Signed)
Requested medication (s) are due for refill today: yes  Requested medication (s) are on the active medication list: yes  Last refill:  01/24/2019  Future visit scheduled: yes  Notes to clinic:  Refill cannot be delegated    Requested Prescriptions  Pending Prescriptions Disp Refills   QUEtiapine (SEROQUEL) 25 MG tablet [Pharmacy Med Name: QUETIAPINE FUMARATE 25 MG Tablet] 300 tablet     Sig: TAKE 1 TABLET EVERY MORNING, 1 [place] 2 TABLETS BEFORE BEDTIME     Not Delegated - Psychiatry:  Antipsychotics - Second Generation (Atypical) - quetiapine Failed - 03/13/2019  3:02 PM      Failed - This refill cannot be delegated      Passed - ALT in normal range and within 180 days    ALT  Date Value Ref Range Status  02/22/2019 10 0 - 44 U/L Final   SGPT (ALT)  Date Value Ref Range Status  10/04/2012 20 12 - 78 U/L Final         Passed - AST in normal range and within 180 days    AST  Date Value Ref Range Status  02/22/2019 17 15 - 41 U/L Final   SGOT(AST)  Date Value Ref Range Status  10/04/2012 22 15 - 37 Unit/L Final         Passed - Last BP in normal range    BP Readings from Last 1 Encounters:  02/28/19 (!) 119/52         Passed - Valid encounter within last 6 months    Recent Outpatient Visits          1 month ago Aortic atherosclerosis [facility])   [facility] [name], FNP   2 months ago Abnormal CT scan of lung   [place], NP   2 months ago Vaginal sore   [place], NP   3 months ago History of recent fall   [place], FNP   5 months ago Cough   [place], NP      Future Appointments            In 2 months [name], [facility], [place]   In 7 months Stoioff, Ronda Fairly, MD [place] -  Completed PHQ-2 or PHQ-9 in the last 360 days.

## 2019-03-14 NOTE — Telephone Encounter (Signed)
I provided 1 refill yesterday for this patient for seroquel.

## 2019-03-14 NOTE — Telephone Encounter (Signed)
DO you refill this medication or do she have a psychiatrist

## 2019-03-18 ENCOUNTER — Emergency Department
Admission: EM | Admit: 2019-03-18 | Discharge: 2019-03-18 | Disposition: A | Discharge status: Home / Self Care | Payer: Medicare HMO | Attending: Emergency Medicine | Admitting: Emergency Medicine

## 2019-03-18 ENCOUNTER — Emergency Department: Payer: Medicare HMO

## 2019-03-18 ENCOUNTER — Other Ambulatory Visit: Payer: Self-pay

## 2019-03-18 DIAGNOSIS — Z89612 Acquired absence of left leg above knee: Secondary | ICD-10-CM | POA: Insufficient documentation

## 2019-03-18 DIAGNOSIS — J449 Chronic obstructive pulmonary disease, unspecified: Secondary | ICD-10-CM | POA: Diagnosis not present

## 2019-03-18 DIAGNOSIS — R102 Pelvic and perineal pain: Secondary | ICD-10-CM | POA: Diagnosis not present

## 2019-03-18 DIAGNOSIS — Z85528 Personal history of other malignant neoplasm of kidney: Secondary | ICD-10-CM | POA: Insufficient documentation

## 2019-03-18 DIAGNOSIS — Z87891 Personal history of nicotine dependence: Secondary | ICD-10-CM | POA: Insufficient documentation

## 2019-03-18 DIAGNOSIS — N184 Chronic kidney disease, stage 4 (severe): Secondary | ICD-10-CM | POA: Diagnosis not present

## 2019-03-18 DIAGNOSIS — R0781 Pleurodynia: Secondary | ICD-10-CM | POA: Insufficient documentation

## 2019-03-18 DIAGNOSIS — Z79899 Other long term (current) drug therapy: Secondary | ICD-10-CM | POA: Diagnosis not present

## 2019-03-18 DIAGNOSIS — E1122 Type 2 diabetes mellitus with diabetic chronic kidney disease: Secondary | ICD-10-CM | POA: Insufficient documentation

## 2019-03-18 DIAGNOSIS — G309 Alzheimer's disease, unspecified: Secondary | ICD-10-CM | POA: Insufficient documentation

## 2019-03-18 DIAGNOSIS — I129 Hypertensive chronic kidney disease with stage 1 through stage 4 chronic kidney disease, or unspecified chronic kidney disease: Secondary | ICD-10-CM | POA: Diagnosis not present

## 2019-03-18 DIAGNOSIS — C7951 Secondary malignant neoplasm of bone: Secondary | ICD-10-CM | POA: Insufficient documentation

## 2019-03-18 LAB — COMPREHENSIVE METABOLIC PANEL
ALT: 13 U/L (ref 0–44)
AST: 16 U/L (ref 15–41)
Albumin: 3.6 g/dL (ref 3.5–5.0)
Alkaline Phosphatase: 159 U/L — ABNORMAL HIGH (ref 38–126)
Anion gap: 14 (ref 5–15)
BUN: 30 mg/dL — ABNORMAL HIGH (ref 8–23)
CO2: 26 mmol/L (ref 22–32)
Calcium: 9 mg/dL (ref 8.9–10.3)
Chloride: 103 mmol/L (ref 98–111)
Creatinine, Ser: 2.11 mg/dL — ABNORMAL HIGH (ref 0.44–1.00)
GFR calc Af Amer: 25 mL/min — ABNORMAL LOW (ref >60)
GFR calc non Af Amer: 21 mL/min — ABNORMAL LOW (ref >60)
Glucose, Bld: 142 mg/dL — ABNORMAL HIGH (ref 70–99)
Potassium: 4.7 mmol/L (ref 3.5–5.1)
Sodium: 143 mmol/L (ref 135–145)
Total Bilirubin: 1.2 mg/dL (ref 0.3–1.2)
Total Protein: 7.5 g/dL (ref 6.5–8.1)

## 2019-03-18 LAB — CBC
HCT: 38 % (ref 36.0–46.0)
Hemoglobin: 11.9 g/dL — ABNORMAL LOW (ref 12.0–15.0)
MCH: 27.5 pg (ref 26.0–34.0)
MCHC: 31.3 g/dL (ref 30.0–36.0)
MCV: 88 fL (ref 80.0–100.0)
Platelets: 261 10*3/uL (ref 150–400)
RBC: 4.32 MIL/uL (ref 3.87–5.11)
RDW: 19 % — ABNORMAL HIGH (ref 11.5–15.5)
WBC: 9.8 10*3/uL (ref 4.0–10.5)
nRBC: 0 % (ref 0.0–0.2)

## 2019-03-18 LAB — LIPASE, BLOOD: Lipase: 35 U/L (ref 11–51)

## 2019-03-18 MED ORDER — OXYCODONE-ACETAMINOPHEN 5-325 MG PO TABS
1.0000 | ORAL_TABLET | Freq: Once | ORAL | Status: AC
  Administered 2019-03-18: 1 via ORAL
  Filled 2019-03-18: qty 1

## 2019-03-18 MED ORDER — LIDOCAINE 5 % EX PTCH
1.0000 | MEDICATED_PATCH | CUTANEOUS | Status: DC
  Administered 2019-03-18: 16:00:00 1 via TRANSDERMAL
  Filled 2019-03-18: qty 1

## 2019-03-18 MED ORDER — LIDOCAINE 5 % EX PTCH: 1.0000 | MEDICATED_PATCH | CUTANEOUS | 0 refills | Status: DC

## 2019-03-18 NOTE — ED Triage Notes (Addendum)
Pt presents via POV c/o left lower abd pain. Per pt husband, pt has known cancer. Pt seen for similar pain from unknown cause per pt husband in the past. Pt states "I cant take this pain".

## 2019-03-18 NOTE — ED Provider Notes (Signed)
[name] Provider Note  ____________________________________________  Time seen: Approximately 3:18 PM  I have reviewed the triage vital signs and the nursing notes.   HISTORY  Chief Complaint Abdominal Pain    HPI Amy [name] is a 83 y.o. female that presents to the emergency department for evaluation of left rib pain worse today.  Patient was just diagnosed with a bone mets to the left eighth rib.  She is in the process of being scheduled for radiation but has not yet started.  No recent illness.  No fever, shortness of breath, cough, abdominal pain, urinary symptoms.   Past Medical History:  Diagnosis Date  . Anemia    low iron  . Anxiety   . Arthritis   . Bilateral cataracts   . Bronchitis    hx of   . Cataract   . Chronic kidney disease   . Depression   . Diabetes mellitus without complication ([place])   . 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 Emphysema of lung ([place])   . GERD (gastroesophageal reflux disease)   . Headache   . History of frequent urinary tract infections   . Hx of renal cell cancer    LEFT  . Hyperlipidemia   . Hypertension   . Numbness and tingling    right hand   . Obesity   . Osteopenia 10/05/2017  . Peripheral artery disease ([place])   . Urinary frequency   . Urinary incontinence     Patient Active Problem List   Diagnosis Date Noted  . Bone metastasis ([place]) 03/13/2019  . Rib lesion 03/08/2019  . Right lower lobe pulmonary nodule 02/08/2019  . Cancer of intra-abdominal lymph nodes ([place]) 01/19/2019  . Genital labial ulcer 12/06/2018  . B12 deficiency 11/05/2018  . Folate deficiency 11/05/2018  . Gastric cancer ([place])   . Stomach irritation   . Obesity (BMI 30.0-34.9) 09/11/2018  . Pulmonary nodules 08/27/2018  . Goals of care, counseling/discussion   . Palliative care by specialist   . Hypoxia 07/26/2018  . HCAP (healthcare-associated  pneumonia) 07/02/2018  . Hypoxemia   . Renal cell carcinoma ([place])   . Lung nodule 04/23/2018  . Hypertensive renal disease 11/08/2017  . Secondary hyperparathyroidism of renal origin ([place]) 11/08/2017  . QT prolongation 10/27/2017  . Osteopenia 10/05/2017  . Choledocholithiasis 08/13/2017  . Chronic pain syndrome 04/05/2017  . Depression 11/15/2016  . Iron deficiency anemia 09/08/2016  . Anemia 06/29/2016  . Late onset Alzheimer's disease with behavioral disturbance ([place]) 06/14/2016  . S/P AKA (above knee amputation) unilateral, left ([place]) 06/08/2016  . Wheelchair dependent 03/11/2016  . 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 S/p nephrectomy 01/29/2015  . Renal neoplasm 01/15/2015  . Abnormal EKG 01/02/2015  . Renal mass 12/05/2014  . GERD (gastroesophageal reflux disease) 11/20/2014  . GAD (generalized anxiety disorder) 11/20/2014  . AB (asthmatic bronchitis) 11/20/2014  . Aortic atherosclerosis ([place]) 11/20/2014  . Cataract 11/20/2014  . Leg pain 11/20/2014  . Continuous opioid dependence ([place]) 11/20/2014  . CKD (chronic kidney disease) stage 4, GFR 15-29 ml/min (HCC) 11/20/2014  . COPD (chronic obstructive pulmonary disease) ([place]) 11/20/2014  . Excessive falling 11/20/2014  . Fatty infiltration of liver 11/20/2014  . Hyperlipidemia 11/20/2014  . Anterior knee pain 11/20/2014  . LBP (low back pain) 11/20/2014  . Malaise and fatigue 11/20/2014  . Abnormal presence of protein in  urine 11/20/2014  . PVD (peripheral vascular disease) ([place]) 11/20/2014  . Allergic rhinitis 11/20/2014  . At risk for falling 11/20/2014  . Anxiety 11/05/2014  . Elevated serum creatinine 11/05/2014  . Diabetes mellitus type 2, controlled ([place]) 11/05/2014  . H/O adenomatous polyp of colon 07/27/2010    Past Surgical History:  Procedure Laterality Date  . ABDOMINAL HYSTERECTOMY  1978  . CARPAL TUNNEL RELEASE Bilateral   . CHOLECYSTECTOMY    . COLONOSCOPY    .  COLONOSCOPY WITH ESOPHAGOGASTRODUODENOSCOPY (EGD)    . ERCP N/A 08/19/2017   Procedure: ENDOSCOPIC RETROGRADE CHOLANGIOPANCREATOGRAPHY (ERCP);  Surgeon: Arta Silence, MD;  Location: [name] ENDOSCOPY;  Service: Endoscopy;  Laterality: N/A;  . ESOPHAGOGASTRODUODENOSCOPY (EGD) WITH PROPOFOL N/A 10/12/2018   Procedure: ESOPHAGOGASTRODUODENOSCOPY (EGD) WITH PROPOFOL;  Surgeon: Virgel Manifold, MD;  Location: [facility] ENDOSCOPY;  Service: Endoscopy;  Laterality: N/A;  . LEG AMPUTATION     above the knee / left   . ROBOT ASSISTED LAPAROSCOPIC NEPHRECTOMY Left 01/15/2015   Procedure: ROBOTIC ASSISTED LAPAROSCOPIC RADICAL NEPHRECTOMY;  Surgeon: Alexis Frock, MD;  Location: [facility] ORS;  Service: Urology;  Laterality: Left;  . stent placement right leg       Prior to Admission medications   Medication Sig Start Date End Date Taking? Authorizing Provider  acetaminophen (TYLENOL) 325 MG tablet Take 325 mg by mouth every 6 (six) hours as needed.    [provider]  albuterol (VENTOLIN HFA) 108 (90 Base) MCG/ACT inhaler Inhale 1-2 puffs into the lungs every 4 (four) hours as needed.  06/15/17   [provider]  Ascorbic Acid (VITAMIN C) 1000 MG tablet Take 1,000 mg by mouth daily.    [provider]  atorvastatin (LIPITOR) 20 MG tablet Take 1 tablet (20 mg total) by mouth daily at 6 PM. 01/19/19   [name], FNP  clopidogrel (PLAVIX) 75 MG tablet TAKE 1 TABLET EVERY DAY Patient taking differently: Take 75 mg by mouth daily.  09/14/18   Algernon Huxley, MD  donepezil (ARICEPT) 10 MG tablet Take 1 tablet (10 mg total) by mouth at bedtime. 10/06/17   Arnetha Courser, MD  famotidine (PEPCID) 20 MG tablet Take 1 tablet (20 mg total) by mouth daily. 01/19/19   [name], FNP  ferrous sulfate 325 (65 FE) MG tablet Take 325 mg by mouth daily with breakfast. Pt taking every other day    [provider]  FLUoxetine (PROZAC) 10 MG capsule Take 1 capsule (10 mg total) by mouth daily.  07/22/18   Nicholes Mango, MD  gabapentin (NEURONTIN) 300 MG capsule Take 1 capsule (300 mg total) by mouth 3 (three) times daily. 12/19/18   Gillis Santa, MD  HYDROcodone-acetaminophen (NORCO) 7.5-325 MG tablet Take 1 tablet by mouth every 8 (eight) hours as needed for severe pain. Must last 30 days. 03/07/19 04/06/19  Gillis Santa, MD  ipratropium-albuterol (DUONEB) 0.5-2.5 (3) MG/3ML SOLN INHALE THE CONTENTS OF 1 VIAL VIA NEBULIZER THREE TIMES DAILY AS DIRECTED 11/10/17   Laverle Hobby, MD  lidocaine (LIDODERM) 5 % Place 1 patch onto the skin daily. Remove & Discard patch within 12 hours or as directed by MD 03/18/19   Laban Emperor, PA-C  pantoprazole (PROTONIX) 40 MG tablet Take 1 tablet (40 mg total) by mouth daily. 03/01/19   [name], FNP  QUEtiapine (SEROQUEL) 25 MG tablet TAKE 1 TABLET EVERY MORNING, 1 TABLET EVERY AFTERNOON, AND 2 TABLETS BEFORE BEDTIME. 03/13/19   [name], FNP  Thiamine  HCl (VITAMIN B-1) 250 MG tablet Take 250 mg by mouth daily.    [provider]    Allergies Fentanyl and Penicillins  Family History  Problem Relation Age of Onset  . Cancer Mother        Stomach  . Cancer Father   . Diabetes Brother   . Cancer Brother        oral  . Healthy Son   . Healthy Sister     Social History Social History   Tobacco Use  . Smoking status: Former Smoker    Packs/day: 1.50    Years: 51.00    Pack years: 76.50    Types: Cigarettes    Quit date: 05/24/2004    Years since quitting: 14.8  . Smokeless tobacco: Never Used  . Tobacco comment: smoking cessation materials not required  Substance Use Topics  . Alcohol use: Not Currently  . Drug use: No     Review of Systems  Constitutional: No fever/chills ENT: No upper respiratory complaints. Respiratory: No cough. No SOB. Gastrointestinal: No abdominal pain.  No nausea, no vomiting.  Musculoskeletal: Positive for rib pain. Skin: Negative for rash, abrasions, lacerations,  ecchymosis. Neurological: Negative for headaches   ____________________________________________   PHYSICAL EXAM:  VITAL SIGNS: ED Triage Vitals  Enc Vitals Group     BP 03/18/19 1338 133/80     Pulse Rate 03/18/19 1338 94     Resp 03/18/19 1338 18     Temp 03/18/19 1338 98.8 F (37.1 C)     Temp Source 03/18/19 1338 Oral     SpO2 03/18/19 1338 97 %     Weight 03/18/19 1337 150 lb (68 kg)     Height 03/18/19 1337 5\' 2"  (1.575 m)     Head Circumference --      Peak Flow --      Pain Score 03/18/19 1342 10     Pain Loc --      Pain Edu? --      Excl. in [place]? --      Constitutional: Alert and oriented. Well appearing and in no acute distress. Eyes: Conjunctivae are normal. PERRL. EOMI. Head: Atraumatic. ENT:      Ears:      Nose: No congestion/rhinnorhea.      Mouth/Throat: Mucous membranes are moist.  Neck: No stridor. Cardiovascular: Normal rate, regular rhythm.  Good peripheral circulation. Respiratory: Normal respiratory effort without tachypnea or retractions. Lungs CTAB. Good air entry to the bases with no decreased or absent breath sounds. Gastrointestinal: Bowel sounds 4 quadrants. Soft and nontender to palpation. No guarding or rigidity. No palpable masses. No distention.  Musculoskeletal: Full range of motion to all extremities. No gross deformities appreciated.  Tenderness to palpation to left lateral rib cage around 8th rib.  Neurologic:  Normal speech and language. No gross focal neurologic deficits are appreciated.  Skin:  Skin is warm, dry and intact. No rash noted. Psychiatric: Mood and affect are normal. Speech and behavior are normal. Patient exhibits appropriate insight and judgement.   ____________________________________________   LABS (all labs ordered are listed, but only abnormal results are displayed)  Labs Reviewed  COMPREHENSIVE METABOLIC PANEL - Abnormal; Notable for the following components:      Result Value   Glucose, Bld 142 (*)     BUN 30 (*)    Creatinine, Ser 2.11 (*)    Alkaline Phosphatase 159 (*)    GFR calc non Af Amer 21 (*)    GFR  calc Af Amer 25 (*)    All other components within normal limits  CBC - Abnormal; Notable for the following components:   Hemoglobin 11.9 (*)    RDW 19.0 (*)    All other components within normal limits  LIPASE, BLOOD   ____________________________________________  EKG   ____________________________________________  RADIOLOGY   Ct Chest Wo Contrast  Result Date: 03/18/2019 CLINICAL DATA:  Left chest wall pain. History of left renal cell carcinoma status post left nephrectomy in 2016. History of gastric cancer. Known left eighth rib hypermetabolic metastasis as seen on recent PET-CT. EXAM: CT CHEST WITHOUT CONTRAST TECHNIQUE: Multidetector CT imaging of the chest was performed following the standard protocol without IV contrast. COMPARISON:  02/28/2019 PET-CT.  02/15/2027 chest CT. FINDINGS: Cardiovascular: Normal heart size. No significant pericardial effusion/thickening. Left anterior descending and right coronary atherosclerosis. Atherosclerotic nonaneurysmal thoracic aorta. Top-normal caliber main pulmonary artery (3.3 cm diameter), stable. Mediastinum/Nodes: No discrete thyroid nodules. Unremarkable esophagus. No axillary adenopathy. Right paratracheal adenopathy up to 1.4 cm (series 2/image 59), stable. No new pathologically enlarged mediastinal nodes. No discrete hilar adenopathy on this noncontrast scan. Lungs/Pleura: No pneumothorax. No pleural effusion. Moderate centrilobular emphysema with diffuse bronchial wall thickening. Stable masslike irregular focus of consolidation in the posterior right lower lobe measuring 3.5 x 1.3 cm (series 3/image 105) with associated surrounding parenchymal banding, distortion and volume loss, favored to represent rounded atelectasis on prior PET-CT. Solid basilar right upper lobe 1.4 cm subpleural pulmonary nodule (series 3/image 91),  minimally increased from 1.3 cm on 02/15/2019 chest CT. Solid medial left lower lobe 1.2 cm pulmonary nodule (series 3/image 99), minimally increased from 1.1 cm. Sub solid medial left upper lobe 1.1 cm pulmonary nodule (series 3/image 35), minimally increased from 1.0 cm. No acute consolidative airspace disease. No new significant pulmonary nodules. Platelike scarring versus atelectasis in the posterior right middle lobe and basilar left lower lobe. Upper abdomen: Cholecystectomy. Partially visualized simple right renal cysts, largest 8 cm anteriorly. Partially visualized left nephrectomy. Musculoskeletal: Expansile lytic lateral left eighth rib 2.3 cm lesion (series 3/image 92), mildly increased from 1.9 cm on 02/15/2019 chest CT. No new focal osseous lesions. Chronic severe T12 vertebral compression fracture. Moderate thoracic spondylosis. IMPRESSION: 1. Mild growth of expansile lytic lateral left eighth rib metastasis. 2. Slight growth bilateral pulmonary nodules suspicious for pulmonary metastases. 3. Stable mild mediastinal lymphadenopathy. 4. Coronary atherosclerosis. Aortic Atherosclerosis (ICD10-I70.0) and Emphysema (ICD10-J43.9). Electronically Signed   By: Ilona Sorrel M.D.   On: 03/18/2019 17:33    ____________________________________________    PROCEDURES  Procedure(s) performed:    Procedures    Medications  lidocaine (LIDODERM) 5 % 1 patch (1 patch Transdermal Patch Applied 03/18/19 1628)  oxyCODONE-acetaminophen (PERCOCET/ROXICET) 5-325 MG per tablet 1 tablet (1 tablet Oral Given 03/18/19 1821)     ____________________________________________   INITIAL IMPRESSION / ASSESSMENT AND PLAN / ED COURSE  Pertinent labs & imaging results that were available during my care of the patient were reviewed by me and considered in my medical decision making (see chart for details).  Review of the [place] CSRS was performed in accordance of the [place] prior to dispensing any controlled  drugs.   Patient presents emergency department for evaluation of left rib pain worse today.  Vital signs and exam are reassuring.  CT consistent with left eighth rib lesion that has slightly grown since CT 1 month ago.  Patient was given Percocet for pain and Lidoderm patch.  Patient takes 7.5 mg of hydrocodone 3  times per day at home and will continue this.  Patient and husband will call the cancer center and the pain clinic in the morning for a follow-up appointment.  Patient will be discharged home with prescriptions for Lidoderm patch. Patient is to follow up with primary care as directed. Patient is given ED precautions to return to the ED for any worsening or new symptoms.  Amy [name] was evaluated in Emergency Department on 03/18/2019 for the symptoms described in the history of present illness. She was evaluated in the context of the global COVID-19 pandemic, which necessitated consideration that the patient might be at risk for infection with the SARS-CoV-2 virus that causes COVID-19. Institutional protocols and algorithms that pertain to the evaluation of patients at risk for COVID-19 are in a state of rapid change based on information released by regulatory bodies including the CDC and federal and state organizations. These policies and algorithms were followed during the patient's care in the ED.   ____________________________________________  FINAL CLINICAL IMPRESSION(S) / ED DIAGNOSES  Final diagnoses:  Rib pain  Bone metastasis ([place])      NEW MEDICATIONS STARTED DURING THIS VISIT:  ED Discharge Orders         Ordered    lidocaine (LIDODERM) 5 %  Every 24 hours     03/18/19 1815              This chart was dictated using voice recognition software/Dragon. Despite best efforts to proofread, errors can occur which can change the meaning. Any change was purely unintentional.    Laban Emperor, PA-C 03/18/19 2009    Lavonia Drafts, MD 03/18/19 2041

## 2019-03-19 ENCOUNTER — Telehealth: Payer: Self-pay | Admitting: Student in an Organized Health Care Education/Training Program

## 2019-03-19 ENCOUNTER — Telehealth: Payer: Self-pay | Admitting: Primary Care

## 2019-03-19 DIAGNOSIS — F411 Generalized anxiety disorder: Secondary | ICD-10-CM | POA: Diagnosis not present

## 2019-03-19 DIAGNOSIS — R011 Cardiac murmur, unspecified: Secondary | ICD-10-CM | POA: Diagnosis not present

## 2019-03-19 DIAGNOSIS — R0602 Shortness of breath: Secondary | ICD-10-CM | POA: Diagnosis not present

## 2019-03-19 DIAGNOSIS — J449 Chronic obstructive pulmonary disease, unspecified: Secondary | ICD-10-CM | POA: Diagnosis not present

## 2019-03-19 DIAGNOSIS — G301 Alzheimer's disease with late onset: Secondary | ICD-10-CM | POA: Diagnosis not present

## 2019-03-19 DIAGNOSIS — I208 Other forms of angina pectoris: Secondary | ICD-10-CM | POA: Diagnosis not present

## 2019-03-19 DIAGNOSIS — E782 Mixed hyperlipidemia: Secondary | ICD-10-CM | POA: Diagnosis not present

## 2019-03-19 DIAGNOSIS — G4733 Obstructive sleep apnea (adult) (pediatric): Secondary | ICD-10-CM | POA: Diagnosis not present

## 2019-03-19 DIAGNOSIS — Z8639 Personal history of other endocrine, nutritional and metabolic disease: Secondary | ICD-10-CM | POA: Diagnosis not present

## 2019-03-19 NOTE — Telephone Encounter (Signed)
T/c to family re call over weekend. Patient went to ED and was assessed with 8th rib bone mets. Received lidocaine patches for this at ED and he reports it's helping.I asked about radiation therapy in rib, and has 11/2 radiation onc. appt to assess. She appears to have several points of involvement. I will continue to follow for palliative services.

## 2019-03-19 NOTE — Telephone Encounter (Signed)
Patient was having a lot of pain and husband took her to the ER and was given a patch. Script was sent to the pharmacy and pharmacy told husband insurance would not cover this without PA. Was told she has some cancer on her rib where the pain is and an appt is set for Nov 2. Please call to advise what she can do for pain.  They have to leave at 1:30 for doctors appt.

## 2019-03-19 NOTE — Telephone Encounter (Signed)
Spoke with patient's husband,  He had to take her to ED on yesterday d/t rib pain where her cancer is located.  They applied a lidoderm 5% patch and sent in 30 day supply which insurance will not cover.  They also gave her a percocet 5 - 325 mg.  Patient states she really  Needs something more to manage the pain because the hydrocodone- apap is not managing even with lidoderm patch. Told him I would convey this information to Dr Holley Raring to see what more we can do.

## 2019-03-19 NOTE — Telephone Encounter (Signed)
Left voicemail with patient giving instructions that Dr Holley Raring conveyed.

## 2019-03-26 ENCOUNTER — Ambulatory Visit
Admission: RE | Admit: 2019-03-26 | Discharge: 2019-03-26 | Disposition: A | Discharge status: Home / Self Care | Payer: Medicare HMO | Source: Ambulatory Visit | Attending: Radiation Oncology | Admitting: Radiation Oncology

## 2019-03-26 ENCOUNTER — Encounter: Payer: Self-pay | Admitting: Radiation Oncology

## 2019-03-26 ENCOUNTER — Ambulatory Visit (INDEPENDENT_AMBULATORY_CARE_PROVIDER_SITE_OTHER): Payer: Medicare HMO | Admitting: Family Medicine

## 2019-03-26 ENCOUNTER — Encounter: Payer: Self-pay | Admitting: Family Medicine

## 2019-03-26 ENCOUNTER — Other Ambulatory Visit: Payer: Self-pay

## 2019-03-26 VITALS — BP 128/84 | HR 95 | Temp 97.5°F | Resp 18

## 2019-03-26 DIAGNOSIS — N3000 Acute cystitis without hematuria: Secondary | ICD-10-CM

## 2019-03-26 DIAGNOSIS — C7951 Secondary malignant neoplasm of bone: Secondary | ICD-10-CM

## 2019-03-26 DIAGNOSIS — Z89612 Acquired absence of left leg above knee: Secondary | ICD-10-CM

## 2019-03-26 DIAGNOSIS — C162 Malignant neoplasm of body of stomach: Secondary | ICD-10-CM | POA: Diagnosis not present

## 2019-03-26 DIAGNOSIS — Z993 Dependence on wheelchair: Secondary | ICD-10-CM | POA: Diagnosis not present

## 2019-03-26 DIAGNOSIS — Z923 Personal history of irradiation: Secondary | ICD-10-CM | POA: Insufficient documentation

## 2019-03-26 DIAGNOSIS — R918 Other nonspecific abnormal finding of lung field: Secondary | ICD-10-CM | POA: Diagnosis not present

## 2019-03-26 DIAGNOSIS — L89301 Pressure ulcer of unspecified buttock, stage 1: Secondary | ICD-10-CM

## 2019-03-26 MED ORDER — DOXYCYCLINE HYCLATE 100 MG PO TABS: 100.0000 mg | ORAL_TABLET | Freq: Two times a day (BID) | ORAL | 0 refills | Status: AC

## 2019-03-26 NOTE — Progress Notes (Signed)
Radiation Oncology Follow up Note old patient new area left rib metastasis  Name: Amy [name]   Date:   03/26/2019 MRN:  [id number] DOB: 1936-01-08    This 83 y.o. female presents to the clinic today for evaluation of left rib metastasis and patient previously treated with palliative radiation therapy for stage I gastric carcinoma.  REFERRING PROVIDER: [name], FNP  HPI: Patient is an 83 year old female well-known to our department recently received palliative radiation therapy to a gastric stage I carcinoma.  She completed treatment in August 2020.  She also has stage III renal cell carcinoma status post left radical nephrectomy in 2016 she has some small pulmonary nodules on PET CT scan as well as a lytic lesion of the eighth rib on recent PET CT scan last month.  They were unable to safely do a biopsy her case was presented at our weekly tumor conference with recommendation based on the new appearance of the lesion as well as hypermetabolic activity that this is definitively a metastatic lesion and would benefit from palliative radiation therapy.  She has been having some left rib pain intermittently.  She specifically denies nausea or any other gastric dyspepsia at this time..  COMPLICATIONS OF TREATMENT: none  FOLLOW UP COMPLIANCE: keeps appointments   PHYSICAL EXAM:  There were no vitals taken for this visit. Well-developed well-nourished patient in NAD. HEENT reveals PERLA, EOMI, discs not visualized.  Oral cavity is clear. No oral mucosal lesions are identified. Neck is clear without evidence of cervical or supraclavicular adenopathy. Lungs are clear to A&P. Cardiac examination is essentially unremarkable with regular rate and rhythm without murmur rub or thrill. Abdomen is benign with no organomegaly or masses noted. Motor sensory and DTR levels are equal and symmetric in the upper and lower extremities. Cranial nerves II through XII are grossly intact. Proprioception is  intact. No peripheral adenopathy or edema is identified. No motor or sensory levels are noted. Crude visual fields are within normal range.  RADIOLOGY RESULTS: PET CT scan reviewed compatible with above-stated findings  PLAN: At this time elected ahead with a course of palliative radiation therapy to her left eighth rib.  Would use PET fusion CT scan to help delineate make fields.  Would plan on delivering 2000 cGy in 5 fractions.  Risks and benefits of treatment including skin reaction possible fatigue possible slight exposure to her lung all were discussed in detail.  I have personally set up and ordered CT simulation for tomorrow.  Patient and husband both comprehend my treatment plan well.  I would like to take this opportunity to thank you for allowing me to participate in the care of your patient.[name], MD

## 2019-03-26 NOTE — Progress Notes (Signed)
Name: Amy [name]   MRN: [id number]    DOB: April 06, 1936   Date:03/26/2019       Progress Note  Subjective  Chief Complaint  Chief Complaint  Patient presents with   Urinary Tract Infection    burning, frequency   Hand Pain    HPI  Pressure Ulcer: She has had bed sores in some fashion for over a year.  She has LEFT AKA and is wheelchair bound.  She has seen wound center in the past, healed well at that time with routine dressing changes.  Since then, she has not had any dressing changes.  She now has recurrence of wounds on bilateral central buttocks that are quite painful.  Husband states it is difficult for her to roll onto one side of the other - she only sleeps on her back.  Discussed positioning with pillows.  She has been released from home health.    UTI: She is having dysuria, foul odor, for several days now. Denies abdominal or low back pain, history of kidney stones, fevers/chills. Pt has urinary incontinence at times, worse when she has an infection, so it has been difficult to obtain a sample.  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[place])    Stomach irritation    Obesity (BMI 30.0-34.9) 09/11/2018   Pulmonary nodules 08/27/2018   Goals of care, counseling/discussion    Palliative care by specialist    Hypoxia 07/26/2018   HCAP (healthcare-associated pneumonia) 07/02/2018   Hypoxemia    Renal cell carcinoma ([place])    Lung nodule 04/23/2018   Hypertensive renal disease 11/08/2017   Secondary hyperparathyroidism of renal origin ([place]) 11/08/2017   QT prolongation 10/27/2017   Osteopenia 10/05/2017   Choledocholithiasis 08/13/2017   Chronic pain  syndrome 04/05/2017   Depression 11/15/2016   Iron deficiency anemia 09/08/2016   Anemia 06/29/2016   Late onset Alzheimer's disease with behavioral disturbance ([place]) 06/14/2016   S/P AKA (above knee amputation) unilateral, left ([place]) 06/08/2016   Wheelchair dependent 03/11/2016   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 ([place]) 03/13/2015   S/p nephrectomy 01/29/2015   Renal neoplasm 01/15/2015   Abnormal EKG 01/02/2015   Renal mass 12/05/2014   GERD (gastroesophageal reflux disease) 11/20/2014   GAD (generalized anxiety disorder) 11/20/2014   AB (asthmatic bronchitis) 11/20/2014   Aortic atherosclerosis ([place]) 11/20/2014   Cataract 11/20/2014   Leg pain 11/20/2014   Continuous opioid dependence ([place]) 11/20/2014   CKD (chronic kidney disease) stage 4, GFR 15-29 ml/min (HCC) 11/20/2014   COPD (chronic obstructive pulmonary disease) ([place]) 11/20/2014   Excessive falling 11/20/2014   Fatty infiltration of liver 11/20/2014   Hyperlipidemia 11/20/2014   Anterior knee pain 11/20/2014   LBP (low back pain) 11/20/2014   Malaise and fatigue 11/20/2014   Abnormal presence of protein in urine 11/20/2014   PVD (peripheral vascular disease) ([place]) 11/20/2014   Allergic rhinitis 11/20/2014   At risk for falling 11/20/2014   Anxiety 11/05/2014   Elevated serum creatinine 11/05/2014   Diabetes mellitus type 2, controlled ([place]) 11/05/2014   H/O adenomatous polyp of colon 07/27/2010    Social History   Tobacco Use   Smoking status: Former Smoker  Packs/day: 1.50    Years: 51.00    Pack years: 76.50    Types: Cigarettes    Quit date: 05/24/2004    Years since quitting: 14.8   Smokeless tobacco: Never Used   Tobacco comment: smoking cessation materials not required  Substance Use Topics   Alcohol use: Not Currently     Current Outpatient Medications:    acetaminophen (TYLENOL) 325 MG tablet, Take 325 mg by mouth every 6 (six) hours as  needed., Disp: , Rfl:    albuterol (VENTOLIN HFA) 108 (90 Base) MCG/ACT inhaler, Inhale 1-2 puffs into the lungs every 4 (four) hours as needed. , Disp: , Rfl:    Ascorbic Acid (VITAMIN C) 1000 MG tablet, Take 1,000 mg by mouth daily., Disp: , Rfl:    atorvastatin (LIPITOR) 20 MG tablet, Take 1 tablet (20 mg total) by mouth daily at 6 PM., Disp: 90 tablet, Rfl: 3   clopidogrel (PLAVIX) 75 MG tablet, TAKE 1 TABLET EVERY DAY (Patient taking differently: Take 75 mg by mouth daily. ), Disp: 90 tablet, Rfl: 3   donepezil (ARICEPT) 10 MG tablet, Take 1 tablet (10 mg total) by mouth at bedtime., Disp: 90 tablet, Rfl: 3   famotidine (PEPCID) 20 MG tablet, Take 1 tablet (20 mg total) by mouth daily., Disp: 90 tablet, Rfl: 3   ferrous sulfate 325 (65 FE) MG tablet, Take 325 mg by mouth daily with breakfast. Pt taking every other day, Disp: , Rfl:    FLUoxetine (PROZAC) 10 MG capsule, Take 1 capsule (10 mg total) by mouth daily., Disp: 30 capsule, Rfl: 0   gabapentin (NEURONTIN) 300 MG capsule, Take 1 capsule (300 mg total) by mouth 3 (three) times daily., Disp: 270 capsule, Rfl: 3   HYDROcodone-acetaminophen (NORCO) 7.5-325 MG tablet, Take 1 tablet by mouth every 8 (eight) hours as needed for severe pain. Must last 30 days., Disp: 90 tablet, Rfl: 0   ipratropium-albuterol (DUONEB) 0.5-2.5 (3) MG/3ML SOLN, INHALE THE CONTENTS OF 1 VIAL VIA NEBULIZER THREE TIMES DAILY AS DIRECTED, Disp: 810 mL, Rfl: 2   lidocaine (LIDODERM) 5 %, Place 1 patch onto the skin daily. Remove & Discard patch within 12 hours or as directed by MD, Disp: 30 patch, Rfl: 0   pantoprazole (PROTONIX) 40 MG tablet, Take 1 tablet (40 mg total) by mouth daily., Disp: 90 tablet, Rfl: 1   QUEtiapine (SEROQUEL) 25 MG tablet, TAKE 1 TABLET EVERY MORNING, 1 TABLET EVERY AFTERNOON, AND 2 TABLETS BEFORE BEDTIME., Disp: 300 tablet, Rfl: 0   Thiamine HCl (VITAMIN B-1) 250 MG tablet, Take 250 mg by mouth daily., Disp: , Rfl:   Allergies    Allergen Reactions   Fentanyl Other (See Comments)    urine retention   Penicillins Itching, Rash and Other (See Comments)    Has patient had a PCN reaction causing immediate rash, facial/tongue/throat swelling, SOB or lightheadedness with hypotension: Yes Has patient had a PCN reaction causing severe rash involving mucus membranes or skin necrosis: No Has patient had a PCN reaction that required hospitalization: No Has patient had a PCN reaction occurring within the last 10 years: Yes If all of the above answers are "NO", then may proceed with Cephalosporin use.    I personally reviewed active problem list, medication list, allergies, notes from last encounter, lab results with the patient/caregiver today.  ROS  Ten systems reviewed and is negative except as mentioned in HPI  Objective  Vitals:   03/26/19 1346  BP: 128/84  Pulse: 95  Resp: 18  Temp: (!) 97.5 F (36.4 C)  TempSrc: Temporal  SpO2: 97%   There is no height or weight on file to calculate BMI.  Nursing Note and Vital Signs reviewed.  Physical Exam  Constitutional: Patient appears well-developed and well-nourished. No distress.  HENT: Head: Normocephalic and atraumatic.  Eyes: Conjunctivae and EOM are normal. No scleral icterus.  Pupils are equal, round, and reactive to light.  Neck: Normal range of motion. Neck supple. No JVD present. No thyromegaly present.  Cardiovascular: Normal rate, regular rhythm and normal heart sounds.  No murmur heard. No BLE edema. Pulmonary/Chest: Effort normal and breath sounds normal. No respiratory distress. Abdominal: Soft. Bowel sounds are normal, no distension. There is no tenderness. No masses. Musculoskeletal: Normal range of motion, no joint effusions. No gross deformities Neurological: Pt is alert and oriented to person, place, and time. No cranial nerve deficit. Coordination, balance, strength, speech and gait are normal.  Skin: There is scaling skin with erythema to  bilateral medial buttocks.  There is no open wound, discharge, or bleeding.   Psychiatric: Patient has a normal mood and affect. behavior is normal. Judgment and thought content normal.  No results found for this or any previous visit (from the past 72 hour(s)).  Assessment & Plan  1. Pressure injury of buttock, stage 1, unspecified laterality - discussed using pillow to slightly turn patient while she is in sitting on lying position every few hours, demonstrated and husband verbalizes understanding and will work on reducing pressure to central buttock - AMB referral to wound care center  2. S/P AKA (above knee amputation) unilateral, left (HCC) - AMB referral to wound care center  3. Wheelchair dependent - AMB referral to wound care center  4. Acute cystitis without hematuria - We will treat as we are unable to obtain sample today and she is symptomatic.  She has advanced kidney disease, and we will use doxy to treat. - doxycycline (VIBRA-TABS) 100 MG tablet; Take 1 tablet (100 mg total) by mouth 2 (two) times daily for 7 days.  Dispense: 14 tablet; Refill: 0 - Urine Culture  Did discuss with both Mrs. And Mr. Mcnorton the FMLA paperwork request from Cheryln Manly, Mr. Blacksher's son.  They both state that he has trouble taking off of work for her appointments and/or in the event of an emergency and would appreciate this paperwork being completed on his behalf so that Mr. Rau has additional assistance with appointments and any urgent needs for Mrs. Morad.  This will be completed.  -Red flags and when to present for emergency care or RTC including fever >101.30F, chest pain, shortness of breath, new/worsening/un-resolving symptoms, reviewed with patient at time of visit. Follow up and care instructions discussed and provided in AVS.

## 2019-03-27 ENCOUNTER — Other Ambulatory Visit: Payer: Self-pay

## 2019-03-27 ENCOUNTER — Ambulatory Visit
Admission: RE | Admit: 2019-03-27 | Discharge: 2019-03-27 | Disposition: A | Discharge status: Home / Self Care | Payer: Medicare HMO | Source: Ambulatory Visit | Attending: Radiation Oncology | Admitting: Radiation Oncology

## 2019-03-27 DIAGNOSIS — Z510 Encounter for antineoplastic radiation therapy: Secondary | ICD-10-CM | POA: Diagnosis not present

## 2019-03-27 DIAGNOSIS — C162 Malignant neoplasm of body of stomach: Secondary | ICD-10-CM | POA: Insufficient documentation

## 2019-03-27 DIAGNOSIS — C7951 Secondary malignant neoplasm of bone: Secondary | ICD-10-CM | POA: Diagnosis not present

## 2019-03-28 DIAGNOSIS — C7951 Secondary malignant neoplasm of bone: Secondary | ICD-10-CM | POA: Diagnosis not present

## 2019-03-28 DIAGNOSIS — C162 Malignant neoplasm of body of stomach: Secondary | ICD-10-CM | POA: Diagnosis not present

## 2019-03-28 DIAGNOSIS — Z510 Encounter for antineoplastic radiation therapy: Secondary | ICD-10-CM | POA: Diagnosis not present

## 2019-03-29 ENCOUNTER — Other Ambulatory Visit: Payer: Self-pay | Admitting: Family Medicine

## 2019-03-29 DIAGNOSIS — F3342 Major depressive disorder, recurrent, in full remission: Secondary | ICD-10-CM

## 2019-03-29 DIAGNOSIS — F419 Anxiety disorder, unspecified: Secondary | ICD-10-CM

## 2019-03-29 NOTE — Telephone Encounter (Signed)
Requested medication (s) are due for refill today: no  Requested medication (s) are on the active medication list: yes  Last refill:  03/23/2019  Future visit scheduled: yes  Notes to clinic:  Refill cannot be delegated    Requested Prescriptions  Pending Prescriptions Disp Refills   QUEtiapine (SEROQUEL) 25 MG tablet [Pharmacy Med Name: QUETIAPINE FUMARATE 25 MG Tablet] 300 tablet 0    Sig: TAKE 1 TABLET EVERY MORNING, 1 TABLET EVERY AFTERNOON, AND 2 TABLETS BEFORE BEDTIME.     Not Delegated - Psychiatry:  Antipsychotics - Second Generation (Atypical) - quetiapine Failed - 03/29/2019  1:37 PM      Failed - This refill cannot be delegated      Passed - ALT in normal range and within 180 days    ALT  Date Value Ref Range Status  03/18/2019 13 0 - 44 U/L Final   SGPT (ALT)  Date Value Ref Range Status  10/04/2012 20 12 - 78 U/L Final         Passed - AST in normal range and within 180 days    AST  Date Value Ref Range Status  03/18/2019 16 15 - 41 U/L Final   SGOT(AST)  Date Value Ref Range Status  10/04/2012 22 15 - 37 Unit/L Final         Passed - Last BP in normal range    BP Readings from Last 1 Encounters:  03/26/19 128/84         Passed - Valid encounter within last 6 months    Recent Outpatient Visits          3 days ago Pressure injury of buttock, stage 1, unspecified laterality   [facility] [name], FNP   2 months ago Aortic atherosclerosis [employer])   [place], FNP   2 months ago Abnormal CT scan of lung   [place], NP   3 months ago Vaginal sore   [place], NP   4 months ago History of recent fall   [place], [place]      Future Appointments            In 1 month [name], [facility], [place]   In 7 months Stoioff, Ronda Fairly, MD [place] - Completed PHQ-2 or PHQ-9 in the last 360 days.

## 2019-03-30 NOTE — Telephone Encounter (Signed)
Do She go to Psychiatry or do you refill this medication

## 2019-04-02 ENCOUNTER — Other Ambulatory Visit: Payer: Self-pay

## 2019-04-02 ENCOUNTER — Ambulatory Visit
Admission: RE | Admit: 2019-04-02 | Discharge: 2019-04-02 | Disposition: A | Discharge status: Home / Self Care | Payer: Medicare HMO | Source: Ambulatory Visit | Attending: Radiation Oncology | Admitting: Radiation Oncology

## 2019-04-02 DIAGNOSIS — C7951 Secondary malignant neoplasm of bone: Secondary | ICD-10-CM | POA: Diagnosis not present

## 2019-04-02 DIAGNOSIS — J449 Chronic obstructive pulmonary disease, unspecified: Secondary | ICD-10-CM | POA: Diagnosis not present

## 2019-04-02 DIAGNOSIS — C162 Malignant neoplasm of body of stomach: Secondary | ICD-10-CM | POA: Diagnosis not present

## 2019-04-02 DIAGNOSIS — Z510 Encounter for antineoplastic radiation therapy: Secondary | ICD-10-CM | POA: Diagnosis not present

## 2019-04-03 ENCOUNTER — Ambulatory Visit
Admission: RE | Admit: 2019-04-03 | Discharge: 2019-04-03 | Disposition: A | Discharge status: Home / Self Care | Payer: Medicare HMO | Source: Ambulatory Visit | Attending: Radiation Oncology | Admitting: Radiation Oncology

## 2019-04-03 ENCOUNTER — Other Ambulatory Visit: Payer: Self-pay

## 2019-04-03 DIAGNOSIS — C7951 Secondary malignant neoplasm of bone: Secondary | ICD-10-CM | POA: Diagnosis not present

## 2019-04-03 DIAGNOSIS — C162 Malignant neoplasm of body of stomach: Secondary | ICD-10-CM | POA: Diagnosis not present

## 2019-04-03 DIAGNOSIS — Z510 Encounter for antineoplastic radiation therapy: Secondary | ICD-10-CM | POA: Diagnosis not present

## 2019-04-03 DIAGNOSIS — J9601 Acute respiratory failure with hypoxia: Secondary | ICD-10-CM | POA: Diagnosis not present

## 2019-04-04 ENCOUNTER — Other Ambulatory Visit: Payer: Self-pay

## 2019-04-04 ENCOUNTER — Ambulatory Visit
Admission: RE | Admit: 2019-04-04 | Discharge: 2019-04-04 | Disposition: A | Discharge status: Home / Self Care | Payer: Medicare HMO | Source: Ambulatory Visit | Attending: Radiation Oncology | Admitting: Radiation Oncology

## 2019-04-04 DIAGNOSIS — C7951 Secondary malignant neoplasm of bone: Secondary | ICD-10-CM | POA: Diagnosis not present

## 2019-04-04 DIAGNOSIS — C162 Malignant neoplasm of body of stomach: Secondary | ICD-10-CM | POA: Diagnosis not present

## 2019-04-04 DIAGNOSIS — Z510 Encounter for antineoplastic radiation therapy: Secondary | ICD-10-CM | POA: Diagnosis not present

## 2019-04-05 ENCOUNTER — Other Ambulatory Visit: Payer: Self-pay

## 2019-04-05 ENCOUNTER — Ambulatory Visit
Admission: RE | Admit: 2019-04-05 | Discharge: 2019-04-05 | Disposition: A | Discharge status: Home / Self Care | Payer: Medicare HMO | Source: Ambulatory Visit | Attending: Radiation Oncology | Admitting: Radiation Oncology

## 2019-04-05 DIAGNOSIS — Z510 Encounter for antineoplastic radiation therapy: Secondary | ICD-10-CM | POA: Diagnosis not present

## 2019-04-05 DIAGNOSIS — C7951 Secondary malignant neoplasm of bone: Secondary | ICD-10-CM | POA: Diagnosis not present

## 2019-04-05 DIAGNOSIS — C162 Malignant neoplasm of body of stomach: Secondary | ICD-10-CM | POA: Diagnosis not present

## 2019-04-06 ENCOUNTER — Ambulatory Visit
Admission: RE | Admit: 2019-04-06 | Discharge: 2019-04-06 | Disposition: A | Discharge status: Home / Self Care | Payer: Medicare HMO | Source: Ambulatory Visit | Attending: Radiation Oncology | Admitting: Radiation Oncology

## 2019-04-06 ENCOUNTER — Other Ambulatory Visit: Payer: Self-pay

## 2019-04-06 DIAGNOSIS — C7951 Secondary malignant neoplasm of bone: Secondary | ICD-10-CM | POA: Diagnosis not present

## 2019-04-06 DIAGNOSIS — Z510 Encounter for antineoplastic radiation therapy: Secondary | ICD-10-CM | POA: Diagnosis not present

## 2019-04-06 DIAGNOSIS — C162 Malignant neoplasm of body of stomach: Secondary | ICD-10-CM | POA: Diagnosis not present

## 2019-04-09 ENCOUNTER — Encounter: Payer: Medicare HMO | Attending: Physician Assistant | Admitting: Physician Assistant

## 2019-04-09 ENCOUNTER — Other Ambulatory Visit: Payer: Self-pay

## 2019-04-09 DIAGNOSIS — F0390 Unspecified dementia without behavioral disturbance: Secondary | ICD-10-CM | POA: Insufficient documentation

## 2019-04-09 DIAGNOSIS — E1122 Type 2 diabetes mellitus with diabetic chronic kidney disease: Secondary | ICD-10-CM | POA: Diagnosis not present

## 2019-04-09 DIAGNOSIS — E1151 Type 2 diabetes mellitus with diabetic peripheral angiopathy without gangrene: Secondary | ICD-10-CM | POA: Diagnosis not present

## 2019-04-09 DIAGNOSIS — E11622 Type 2 diabetes mellitus with other skin ulcer: Secondary | ICD-10-CM | POA: Insufficient documentation

## 2019-04-09 DIAGNOSIS — L89159 Pressure ulcer of sacral region, unspecified stage: Secondary | ICD-10-CM | POA: Diagnosis not present

## 2019-04-09 DIAGNOSIS — L98421 Non-pressure chronic ulcer of back limited to breakdown of skin: Secondary | ICD-10-CM | POA: Diagnosis not present

## 2019-04-09 DIAGNOSIS — I129 Hypertensive chronic kidney disease with stage 1 through stage 4 chronic kidney disease, or unspecified chronic kidney disease: Secondary | ICD-10-CM | POA: Insufficient documentation

## 2019-04-09 DIAGNOSIS — N189 Chronic kidney disease, unspecified: Secondary | ICD-10-CM | POA: Diagnosis not present

## 2019-04-09 DIAGNOSIS — L988 Other specified disorders of the skin and subcutaneous tissue: Secondary | ICD-10-CM | POA: Diagnosis not present

## 2019-04-09 NOTE — Progress Notes (Signed)
Amy [name], [name] ([id number]) Visit Report for 04/09/2019 Abuse/Suicide Risk Screen Details Patient Name: Amy, [name]. Date of Service: 04/09/2019 1:15 PM Medical Record Number: [id number] Patient Account Number: [id number] Date of Birth/Sex: [birth year] (84 y.o. F) Treating RN: Cornell Barman Primary Care Daniyah Fohl: Raelyn Ensign Other Clinician: Referring Issachar Broady: Raelyn Ensign Treating Jakori Burkett/Extender: Melburn Hake, HOYT Weeks in Treatment: 0 Abuse/Suicide Risk Screen Items Answer ABUSE RISK SCREEN: Has anyone close to you tried to hurt or harm you recentlyo No Do you feel uncomfortable with anyone in your familyo No Has anyone forced you do things that you didnot want to doo No Electronic Signature(s) Signed: 04/09/2019 4:39:56 PM By: Gretta Cool, BSN, RN, CWS, Kim RN, BSN Entered By: Gretta Cool, BSN, RN, CWS, Kim on 04/09/2019 13:31:23 Amy [name], [name] ([id number]) -------------------------------------------------------------------------------- Activities of Daily Living Details Patient Name: Amy, TOSH L. Date of Service: 04/09/2019 1:15 PM Medical Record Number: [id number] Patient Account Number: [id number] Date of Birth/Sex: 1936-05-04 (83 y.o. F) Treating RN: Cornell Barman Primary Care Tasha Diaz: Raelyn Ensign Other Clinician: Referring Kaysen Sefcik: Raelyn Ensign Treating Jaycie Kregel/Extender: Melburn Hake, HOYT Weeks in Treatment: 0 Activities of Daily Living Items Answer Activities of Daily Living (Please select one for each item) Drive Automobile Need Assistance Take Medications Need Assistance Use Telephone Need Assistance Care for Appearance Need Assistance Use Toilet Need Counsellor / Shower Need Assistance Dress Self Need Assistance Feed Self Need Assistance Walk Need Assistance Get In / Out Bed Need Assistance Housework Need Assistance Prepare Meals Need Assistance Handle Money Need Assistance Shop for Self Need Assistance Electronic Signature(s) Signed:  04/09/2019 4:39:56 PM By: Gretta Cool, BSN, RN, CWS, Kim RN, BSN Entered By: Gretta Cool, BSN, RN, CWS, Kim on 04/09/2019 13:31:42 Amy [name], [name] ([id number]) -------------------------------------------------------------------------------- Education Screening Details Patient Name: Amy [name], [name] Date of Service: 04/09/2019 1:15 PM Medical Record Number: [id number] Patient Account Number: [id number] Date of Birth/Sex: 1935-11-13 (83 y.o. F) Treating RN: Cornell Barman Primary Care Vernell Townley: Raelyn Ensign Other Clinician: Referring Kearie Mennen: Raelyn Ensign Treating Terrel Nesheiwat/Extender: Melburn Hake, HOYT Weeks in Treatment: 0 Primary Learner Assessed: Caregiver husband Reason Patient is not Primary Learner: Dementia Learning Preferences/Education Level/Primary Language Learning Preference: Explanation, Demonstration Highest Education Level: Grade School Preferred Language: English Cognitive Barrier Language Barrier: No Translator Needed: No Memory Deficit: Yes Emotional Barrier: No Physical Barrier Impaired Vision: No Impaired Hearing: No Decreased Hand dexterity: No Knowledge/Comprehension Knowledge Level: Low Comprehension Level: Medium Ability to understand written Medium instructions: Ability to understand verbal High instructions: Motivation Anxiety Level: Calm Cooperation: Cooperative Education Importance: Acknowledges Need Interest in Health Problems: Asks Questions Perception: Coherent Willingness to Engage in Self- High Management Activities: Readiness to Engage in Self- High Management Activities: Electronic Signature(s) Signed: 04/09/2019 4:39:56 PM By: Gretta Cool, BSN, RN, CWS, Kim RN, BSN Entered By: Gretta Cool, BSN, RN, CWS, Kim on 04/09/2019 13:32:47 Amy [name], [name] ([id number]) -------------------------------------------------------------------------------- Fall Risk Assessment Details Patient Name: Amy [name], [name] Date of Service: 04/09/2019 1:15  PM Medical Record Number: [id number] Patient Account Number: [id number] Date of Birth/Sex: 05/17/1936 (83 y.o. F) Treating RN: Cornell Barman Primary Care Avalynne Diver: Raelyn Ensign Other Clinician: Referring Merrick Maggio: Raelyn Ensign Treating Chayse Gracey/Extender: Melburn Hake, HOYT Weeks in Treatment: 0 Fall Risk Assessment Items Have you had 2 or more falls in the last 12 monthso 0 No Have you had any fall that resulted in injury in the last 12 monthso 0 Yes FALLS RISK SCREEN History of falling - immediate or within 3 months 0 No Secondary diagnosis (Do you have 2 or more medical diagnoseso) 15 Yes  Ambulatory aid None/bed rest/wheelchair/nurse 0 Yes Crutches/cane/walker 0 No Furniture 0 No Intravenous therapy Access/Saline/Heparin Lock 0 No Gait/Transferring Normal/ bed rest/ wheelchair 0 Yes Weak (short steps with or without shuffle, stooped but able to lift head while 0 No walking, may seek support from furniture) Impaired (short steps with shuffle, may have difficulty arising from chair, head 0 No down, impaired balance) Mental Status Oriented to own ability 0 No Electronic Signature(s) Signed: 04/09/2019 4:39:56 PM By: Gretta Cool, BSN, RN, CWS, Kim RN, BSN Entered By: Gretta Cool, BSN, RN, CWS, Kim on 04/09/2019 13:34:05 Amy [name], [name] ([id number]) -------------------------------------------------------------------------------- Foot Assessment Details Patient Name: Amy [name], [name] Date of Service: 04/09/2019 1:15 PM Medical Record Number: [id number] Patient Account Number: [id number] Date of Birth/Sex: 1935-06-03 (83 y.o. F) Treating RN: Cornell Barman Primary Care Shatonya Passon: Raelyn Ensign Other Clinician: Referring Arabela Basaldua: Raelyn Ensign Treating Matilynn Dacey/Extender: Melburn Hake, HOYT Weeks in Treatment: 0 Foot Assessment Items Site Locations + = Sensation present, - = Sensation absent, C = Callus, U = Ulcer R = Redness, W = Warmth, M = Maceration, PU = Pre-ulcerative lesion F = Fissure, S  = Swelling, D = Dryness Assessment Right: Left: Other Deformity: No No Prior Foot Ulcer: No No Prior Amputation: No No Charcot Joint: No No Ambulatory Status: Non-ambulatory Assistance Device: Wheelchair Gait: Engineer, maintenance) Signed: 04/09/2019 4:39:56 PM By: Gretta Cool, BSN, RN, CWS, Kim RN, BSN Entered By: Gretta Cool, BSN, RN, CWS, Kim on 04/09/2019 13:34:31 Amy [name], [name] ([id number]) -------------------------------------------------------------------------------- Nutrition Risk Screening Details Patient Name: Amy [name], [name] Date of Service: 04/09/2019 1:15 PM Medical Record Number: [id number] Patient Account Number: [id number] Date of Birth/Sex: [birth year] (83 y.o. F) Treating RN: Cornell Barman Primary Care Bj Morlock: Raelyn Ensign Other Clinician: Referring Fabrizzio Marcella: Raelyn Ensign Treating Derica Leiber/Extender: Melburn Hake, HOYT Weeks in Treatment: 0 Height (in): 62 Weight (lbs): 154 Body Mass Index (BMI): 28.2 Nutrition Risk Screening Items Score Screening NUTRITION RISK SCREEN: I have an illness or condition that made me change the kind and/or amount of 0 No food I eat I eat fewer than two meals per day 0 No I eat few fruits and vegetables, or milk products 0 No I have three or more drinks of beer, liquor or wine almost every day 0 No I have tooth or mouth problems that make it hard for me to eat 0 No I don't always have enough money to buy the food I need 0 No I eat alone most of the time 0 No I take three or more different prescribed or over-the-counter drugs a day 1 Yes Without wanting to, I have lost or gained 10 pounds in the last six months 0 No I am not always physically able to shop, cook and/or feed myself 0 No Nutrition Protocols Good Risk Protocol 0 No interventions needed Moderate Risk Protocol High Risk Proctocol Risk Level: Good Risk Score: 1 Electronic Signature(s) Signed: 04/09/2019 4:39:56 PM By: Gretta Cool, BSN, RN, CWS, Kim RN, BSN Entered By:  Gretta Cool, BSN, RN, CWS, Kim on 04/09/2019 13:34:20

## 2019-04-09 NOTE — Progress Notes (Signed)
Dilger, Abrar L. ([id number]) Visit Report for 04/09/2019 Allergy List Details Patient Name: Amy [name], Amy [name]. Date of Service: 04/09/2019 1:15 PM Medical Record Number: [id number] Patient Account Number: [id number] Date of Birth/Sex: 1935-11-16 (83 y.o. F) Treating RN: Cornell Barman Primary Care Tondalaya Perren: Raelyn Ensign Other Clinician: Referring Antaeus Karel: Raelyn Ensign Treating Levonia Wolfley/Extender: Melburn Hake, HOYT Weeks in Treatment: 0 Allergies Active Allergies morphine penicillin Allergy Notes Electronic Signature(s) Signed: 04/09/2019 4:39:56 PM By: Gretta Cool, BSN, RN, CWS, Kim RN, BSN Entered By: Gretta Cool, BSN, RN, CWS, Kim on 04/09/2019 13:28:18 Chenette, Serenidy L. ([id number]) -------------------------------------------------------------------------------- Arrival Information Details Patient Name: Amy [name], [name] Date of Service: 04/09/2019 1:15 PM Medical Record Number: [id number] Patient Account Number: [id number] Date of Birth/Sex: [birth year] (83 y.o. F) Treating RN: Cornell Barman Primary Care Lamyra Malcolm: Raelyn Ensign Other Clinician: Referring Clella Mckeel: Raelyn Ensign Treating Nikhil Osei/Extender: Melburn Hake, HOYT Weeks in Treatment: 0 Visit Information Patient Arrived: Wheel Chair Arrival Time: 13:15 Accompanied By: husband Transfer Assistance: Manual Patient Identification Verified: Yes Secondary Verification Process Yes Completed: Patient Has Alerts: Yes Patient Alerts: Patient on Blood Thinner Plavix Diabetic Type II o History Since Last Visit Electronic Signature(s) Signed: 04/09/2019 4:39:56 PM By: Gretta Cool, BSN, RN, CWS, Kim RN, BSN Entered By: Gretta Cool, BSN, RN, CWS, Kim on 04/09/2019 13:20:16 Amer, Yeimi L. ([id number]) -------------------------------------------------------------------------------- Clinic Level of Care Assessment Details Patient Name: Amy [name], [name] Date of Service: 04/09/2019 1:15 PM Medical Record Number: [id number] Patient  Account Number: [id number] Date of Birth/Sex: 02-26-1936 (83 y.o. F) Treating RN: Amy [name] Barban Primary Care Saharsh Sterling: Raelyn Ensign Other Clinician: Referring Myana Schlup: Raelyn Ensign Treating Kwamaine Cuppett/Extender: Melburn Hake, HOYT Weeks in Treatment: 0 Clinic Level of Care Assessment Items TOOL 2 Quantity Score []  - Use when only an EandM is performed on the INITIAL visit 0 ASSESSMENTS - Nursing Assessment / Reassessment X - General Physical Exam (combine w/ comprehensive assessment (listed just below) when 1 20 performed on new pt. evals) X- 1 25 Comprehensive Assessment (HX, ROS, Risk Assessments, Wounds Hx, etc.) ASSESSMENTS - Wound and Skin Assessment / Reassessment X - Simple Wound Assessment / Reassessment - one wound 1 5 []  - 0 Complex Wound Assessment / Reassessment - multiple wounds []  - 0 Dermatologic / Skin Assessment (not related to wound area) ASSESSMENTS - Ostomy and/or Continence Assessment and Care []  - Incontinence Assessment and Management 0 []  - 0 Ostomy Care Assessment and Management (repouching, etc.) PROCESS - Coordination of Care X - Simple Patient / Family Education for ongoing care 1 15 []  - 0 Complex (extensive) Patient / Family Education for ongoing care []  - 0 Staff obtains Programmer, systems, Records, Test Results / Process Orders []  - 0 Staff telephones HHA, Nursing Homes / Clarify orders / etc []  - 0 Routine Transfer to another Facility (non-emergent condition) []  - 0 Routine Hospital Admission (non-emergent condition) []  - 0 New Admissions / Biomedical engineer / Ordering NPWT, Apligraf, etc. []  - 0 Emergency Hospital Admission (emergent condition) X- 1 10 Simple Discharge Coordination []  - 0 Complex (extensive) Discharge Coordination PROCESS - Special Needs []  - Pediatric / Minor Patient Management 0 []  - 0 Isolation Patient Management [name], [name] ([id number]) []  - 0 Hearing / Language / Visual special needs []  - 0 Assessment of  Community assistance (transportation, D/C planning, etc.) X- 1 15 Additional assistance / Altered mentation []  - 0 Support Surface(s) Assessment (bed, cushion, seat, etc.) INTERVENTIONS - Wound Cleansing / Measurement X - Wound Imaging (photographs - any number of wounds) 1 5 []  - 0 Wound Tracing (  instead of photographs) X- 1 5 Simple Wound Measurement - one wound []  - 0 Complex Wound Measurement - multiple wounds X- 1 5 Simple Wound Cleansing - one wound []  - 0 Complex Wound Cleansing - multiple wounds INTERVENTIONS - Wound Dressings X - Small Wound Dressing one or multiple wounds 1 10 []  - 0 Medium Wound Dressing one or multiple wounds []  - 0 Large Wound Dressing one or multiple wounds []  - 0 Application of Medications - injection INTERVENTIONS - Miscellaneous []  - External ear exam 0 []  - 0 Specimen Collection (cultures, biopsies, blood, body fluids, etc.) []  - 0 Specimen(s) / Culture(s) sent or taken to Lab for analysis []  - 0 Patient Transfer (multiple staff / Civil Service fast streamer / Similar devices) []  - 0 Simple Staple / Suture removal (25 or less) []  - 0 Complex Staple / Suture removal (26 or more) []  - 0 Hypo / Hyperglycemic Management (close monitor of Blood Glucose) []  - 0 Ankle / Brachial Index (ABI) - do not check if billed separately Has the patient been seen at the hospital within the last three years: Yes Total Score: 115 Level Of Care: New/Established - Level 3 Electronic Signature(s) Signed: 04/09/2019 4:27:39 PM By: Amy [name] Barban Entered By: Amy [name] Barban on 04/09/2019 13:53:40 [name], [name] ([id number]) -------------------------------------------------------------------------------- Encounter Discharge Information Details Patient Name: Amy [name], [name] Date of Service: 04/09/2019 1:15 PM Medical Record Number: [id number] Patient Account Number: [id number] Date of Birth/Sex: [birth year] (83 y.o. F) Treating RN: Amy [name] Barban Primary Care  Malena Timpone: Raelyn Ensign Other Clinician: Referring Mckynzie Liwanag: Raelyn Ensign Treating Oluwadarasimi Redmon/Extender: Melburn Hake, HOYT Weeks in Treatment: 0 Encounter Discharge Information Items Post Procedure Vitals Discharge Condition: Stable Temperature (F): 97.7 Ambulatory Status: Wheelchair Pulse (bpm): 89 Discharge Destination: Home Respiratory Rate (breaths/min): 16 Transportation: Private Auto Blood Pressure (mmHg): 130/97 Accompanied By: husband Schedule Follow-up Appointment: No Clinical Summary of Care: Electronic Signature(s) Signed: 04/09/2019 4:27:39 PM By: Amy [name] Barban Entered By: Amy [name] Barban on 04/09/2019 14:00:57 Lamson, Lamar L. ([id number]) -------------------------------------------------------------------------------- Lower Extremity Assessment Details Patient Name: Amy [name], [name] Date of Service: 04/09/2019 1:15 PM Medical Record Number: [id number] Patient Account Number: [id number] Date of Birth/Sex: [birth year] (83 y.o. F) Treating RN: Cornell Barman Primary Care Ryn Peine: Raelyn Ensign Other Clinician: Referring Artez Regis: Raelyn Ensign Treating Monti Villers/Extender: Sharalyn Ink in Treatment: 0 Electronic Signature(s) Signed: 04/09/2019 4:39:56 PM By: Gretta Cool, BSN, RN, CWS, Kim RN, BSN Entered By: Gretta Cool, BSN, RN, CWS, Kim on 04/09/2019 13:28:13 Mcmichael, Jaimy L. ([id number]) -------------------------------------------------------------------------------- Multi Wound Chart Details Patient Name: Amy [name], [name] Date of Service: 04/09/2019 1:15 PM Medical Record Number: [id number] Patient Account Number: [id number] Date of Birth/Sex: 17-Aug-1935 (83 y.o. F) Treating RN: Amy [name] Barban Primary Care Jozlynn Plaia: Raelyn Ensign Other Clinician: Referring Danyal Adorno: Raelyn Ensign Treating Anzleigh Slaven/Extender: STONE III, HOYT Weeks in Treatment: 0 Vital Signs Height(in): 62 Pulse(bpm): 89 Weight(lbs): 154 Blood Pressure(mmHg): 130/97 Body Mass Index(BMI):  28 Temperature(F): 97.7 Respiratory Rate 16 (breaths/min): Photos: [4:No Photos] [N/A:N/A] Wound Location: [4:Sacrum - Midline] [N/A:N/A] Wounding Event: [4:Gradually Appeared] [N/A:N/A] Primary Etiology: [4:MASD] [N/A:N/A] Secondary Etiology: [4:Pressure Ulcer] [N/A:N/A] Date Acquired: [4:03/19/2019] [N/A:N/A] Weeks of Treatment: [4:0] [N/A:N/A] Wound Status: [4:Open] [N/A:N/A] Measurements L x W x D [4:1.8x0.2x0.1] [N/A:N/A] (cm) Area (cm) : [4:0.283] [N/A:N/A] Volume (cm) : [4:0.028] [N/A:N/A] Classification: [4:Partial Thickness] [N/A:N/A] Exudate Amount: [4:Medium] [N/A:N/A] Exudate Type: [4:Serous] [N/A:N/A] Exudate Color: [4:amber] [N/A:N/A] Wound Margin: [4:Indistinct, nonvisible] [N/A:N/A] Granulation Amount: [4:Large (67-100%)] [N/A:N/A] Necrotic Amount: [4:None Present (0%)] [N/A:N/A] Exposed Structures: [4:Fascia: No Fat Layer (Subcutaneous Tissue) Exposed: No Tendon:  No Muscle: No Joint: No Bone: No Limited to Skin Breakdown] [N/A:N/A] Treatment Notes Electronic Signature(s) Signed: 04/09/2019 4:27:39 PM By: Amy [name] Barban Entered By: Amy [name] Barban on 04/09/2019 13:52:11 Bushard, Amariss L. ([id number]) -------------------------------------------------------------------------------- [place] Details Patient Name: Amy [name], PRADA. Date of Service: 04/09/2019 1:15 PM Medical Record Number: [id number] Patient Account Number: [id number] Date of Birth/Sex: 1936-01-22 (83 y.o. F) Treating RN: Amy [name] Barban Primary Care Trevor Wilkie: Raelyn Ensign Other Clinician: Referring Justeen Hehr: Raelyn Ensign Treating Zayan Delvecchio/Extender: Melburn Hake, HOYT Weeks in Treatment: 0 Active Inactive Pressure Nursing Diagnoses: Knowledge deficit related to causes and risk factors for pressure ulcer development Knowledge deficit related to management of pressures ulcers Potential for impaired tissue integrity related to pressure, friction, moisture, and  shear Goals: Patient will remain free from development of additional pressure ulcers Date Initiated: 04/09/2019 Target Resolution Date: 05/09/2019 Goal Status: Active Patient/caregiver will verbalize risk factors for pressure ulcer development Date Initiated: 04/09/2019 Target Resolution Date: 04/09/2019 Goal Status: Active Patient/caregiver will verbalize understanding of pressure ulcer management Date Initiated: 04/09/2019 Target Resolution Date: 04/09/2019 Goal Status: Active Interventions: Assess: immobility, friction, shearing, incontinence upon admission and as needed Assess offloading mechanisms upon admission and as needed Assess potential for pressure ulcer upon admission and as needed Provide education on pressure ulcers Notes: Wound/Skin Impairment Nursing Diagnoses: Impaired tissue integrity Knowledge deficit related to ulceration/compromised skin integrity Goals: Ulcer/skin breakdown will have a volume reduction of 30% by week 4 Date Initiated: 04/09/2019 Target Resolution Date: 05/09/2019 Goal Status: Active Interventions: Assess patient/caregiver ability to obtain necessary supplies Assess patient/caregiver ability to perform ulcer/skin care regimen upon admission and as needed Alleva, Miche L. ([id number]) Assess ulceration(s) every visit Notes: Electronic Signature(s) Signed: 04/09/2019 4:27:39 PM By: Amy [name] Barban Entered By: Amy [name] Barban on 04/09/2019 13:51:58 Clendenin, Bracha L. ([id number]) -------------------------------------------------------------------------------- Pain Assessment Details Patient Name: Amy [name], [name] Date of Service: 04/09/2019 1:15 PM Medical Record Number: [id number] Patient Account Number: [id number] Date of Birth/Sex: 03-27-1936 (83 y.o. F) Treating RN: Cornell Barman Primary Care Tradarius Reinwald: Raelyn Ensign Other Clinician: Referring Clennon Nasca: Raelyn Ensign Treating Bayley Hurn/Extender: Melburn Hake, HOYT Weeks in  Treatment: 0 Active Problems Location of Pain Severity and Description of Pain Patient Has Paino No Site Locations Pain Management and Medication Current Pain Management: Goals for Pain Management Patient denies pain at this time. Electronic Signature(s) Signed: 04/09/2019 4:39:56 PM By: Gretta Cool, BSN, RN, CWS, Kim RN, BSN Entered By: Gretta Cool, BSN, RN, CWS, Kim on 04/09/2019 13:18:23 Francisco, Yassmin L. ([id number]) -------------------------------------------------------------------------------- Patient/Caregiver Education Details Patient Name: Amy [name], [name] Date of Service: 04/09/2019 1:15 PM Medical Record Number: [id number] Patient Account Number: [id number] Date of Birth/Gender: [birth year] (84 y.o. F) Treating RN: Amy [name] Barban Primary Care Physician: Raelyn Ensign Other Clinician: Referring Physician: Raelyn Ensign Treating Physician/Extender: Sharalyn Ink in Treatment: 0 Education Assessment Education Provided To: Patient Education Topics Provided Pressure: Handouts: Pressure Ulcers: Care and Offloading Methods: Demonstration, Explain/Verbal Responses: State content correctly Wound/Skin Impairment: Handouts: Caring for Your Ulcer Methods: Demonstration, Explain/Verbal Responses: State content correctly Electronic Signature(s) Signed: 04/09/2019 4:27:39 PM By: Amy [name] Barban Entered By: Amy [name] Barban on 04/09/2019 13:52:53 Amy [name], [name] ([id number]) -------------------------------------------------------------------------------- Wound Assessment Details Patient Name: Amy [name], [name] Date of Service: 04/09/2019 1:15 PM Medical Record Number: [id number] Patient Account Number: [id number] Date of Birth/Sex: 12-22-1935 (83 y.o. F) Treating RN: Cornell Barman Primary Care Tameah Mihalko: Raelyn Ensign Other Clinician: Referring Maniyah Moller: Raelyn Ensign Treating Yvonna Brun/Extender: STONE III, HOYT Weeks in Treatment: 0 Wound Status Wound Number: 4  Primary MASD Etiology: Wound Location: Sacrum -  Midline Secondary Pressure Ulcer Wounding Event: Gradually Appeared Etiology: Date Acquired: 03/19/2019 Wound Open Weeks Of Treatment: 0 Status: Clustered Wound: No Comorbid Cataracts, Anemia, Hypertension, Peripheral History: Arterial Disease, Type II Diabetes, Osteoarthritis, Dementia, Received Radiation Wound Measurements Length: (cm) 1.8 Width: (cm) 0.2 Depth: (cm) 0.1 Area: (cm) 0.283 Volume: (cm) 0.028 % Reduction in Area: 0% % Reduction in Volume: 0% Tunneling: No Undermining: No Wound Description Classification: Partial Thickness Wound Margin: Indistinct, nonvisible Exudate Amount: Medium Exudate Type: Serous Exudate Color: amber Foul Odor After Cleansing: No Slough/Fibrino No Wound Bed Granulation Amount: Large (67-100%) Exposed Structure Necrotic Amount: None Present (0%) Fascia Exposed: No Fat Layer (Subcutaneous Tissue) Exposed: No Tendon Exposed: No Muscle Exposed: No Joint Exposed: No Bone Exposed: No Limited to Skin Breakdown Treatment Notes Wound #4 (Midline Sacrum) Notes Hblue, BFD Electronic Signature(s) Signed: 04/09/2019 4:21:28 PM By: Montey Hora Signed: 04/09/2019 4:39:56 PM By: Gretta Cool, BSN, RN, CWS, Kim RN, BSN Entered By: Montey Hora on 04/09/2019 14:21:34 Smoot, Roise L. ([id number]) Auvil, Emmaleah L. ([id number]) -------------------------------------------------------------------------------- Vitals Details Patient Name: Amy [name], [name] Date of Service: 04/09/2019 1:15 PM Medical Record Number: [id number] Patient Account Number: [id number] Date of Birth/Sex: 01/11/1936 (83 y.o. F) Treating RN: Cornell Barman Primary Care Toua Stites: Raelyn Ensign Other Clinician: Referring Ramani Riva: Raelyn Ensign Treating Andreal Vultaggio/Extender: Melburn Hake, HOYT Weeks in Treatment: 0 Vital Signs Time Taken: 13:18 Temperature (F): 97.7 Height (in): 62 Pulse (bpm): 89 Weight (lbs):  154 Respiratory Rate (breaths/min): 16 Body Mass Index (BMI): 28.2 Blood Pressure (mmHg): 130/97 Reference Range: 80 - 120 mg / dl Airway Pulse Oximetry (%): 99 Inhaled Oxygen Concentration (%): 2 Electronic Signature(s) Signed: 04/09/2019 4:39:56 PM By: Gretta Cool, BSN, RN, CWS, Kim RN, BSN Entered By: Gretta Cool, BSN, RN, CWS, Kim on 04/09/2019 13:19:42

## 2019-04-09 NOTE — Progress Notes (Signed)
Russom, Leyda L. ([id number]) Visit Report for 04/09/2019 Chief Complaint Document Details Patient Name: Amy [name], Amy [name]. Date of Service: 04/09/2019 1:15 PM Medical Record Number: [id number] Patient Account Number: [id number] Date of Birth/Sex: [birth year] (83 y.o. F) Treating RN: Harold Barban Primary Care Provider: Raelyn Ensign Other Clinician: Referring Provider: Raelyn Ensign Treating Provider/Extender: Melburn Hake, Zamauri Nez Weeks in Treatment: 0 Information Obtained from: Patient Chief Complaint Sacral ulcer Electronic Signature(s) Signed: 04/09/2019 1:45:06 PM By: Worthy Keeler PA-C Entered By: Worthy Keeler on 04/09/2019 13:45:05 Pavek, Morgyn L. ([id number]) -------------------------------------------------------------------------------- Debridement Details Patient Name: Amy, [name] Date of Service: 04/09/2019 1:15 PM Medical Record Number: [id number] Patient Account Number: [id number] Date of Birth/Sex: [birth year] (83 y.o. F) Treating RN: Harold Barban Primary Care Provider: Raelyn Ensign Other Clinician: Referring Provider: Raelyn Ensign Treating Provider/Extender: Melburn Hake, Earon Rivest Weeks in Treatment: 0 Debridement Performed for Wound #4 Midline Sacrum Assessment: Performed By: Physician STONE III, Kenyen Candy E., PA-C Debridement Type: Chemical/Enzymatic/Mechanical Agent Used: saline and gauze Level of Consciousness (Pre- Awake and Alert procedure): Pre-procedure Verification/Time Yes - 13:50 Out Taken: Start Time: 13:50 Pain Control: Lidocaine Instrument: Other : saline and gauze Bleeding: None End Time: 13:52 Procedural Pain: 0 Post Procedural Pain: 0 Response to Treatment: Procedure was tolerated well Level of Consciousness Awake and Alert (Post-procedure): Post Debridement Measurements of Total Wound Length: (cm) 1.8 Width: (cm) 0.2 Depth: (cm) 0.1 Volume: (cm) 0.028 Character of Wound/Ulcer Post Debridement: Improved Post  Procedure Diagnosis Same as Pre-procedure Electronic Signature(s) Signed: 04/09/2019 4:27:39 PM By: Harold Barban Signed: 04/09/2019 4:45:50 PM By: Worthy Keeler PA-C Entered By: Harold Barban on 04/09/2019 13:59:50 Gurski, Breunna L. ([id number]) -------------------------------------------------------------------------------- HPI Details Patient Name: Amy, [name] Date of Service: 04/09/2019 1:15 PM Medical Record Number: [id number] Patient Account Number: [id number] Date of Birth/Sex: 06-08-1935 (83 y.o. F) Treating RN: Harold Barban Primary Care Provider: Raelyn Ensign Other Clinician: Referring Provider: Raelyn Ensign Treating Provider/Extender: Melburn Hake, Tamyah Cutbirth Weeks in Treatment: 0 History of Present Illness Associated Signs and Symptoms: The patient has a history of chronic kidney disease, diabetes mellitus type II although her hemoglobin A1c most recent was 5.8 and she is on the medications. 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 She is incontinent. HPI Description: 03/10/18 on evaluation today patient actually presents for evaluation concerning her bilateral gluteal ulcers which have been present for several weeks. She has been treated over time with different creams most recently polysporin. With that being said the patient actually seems to be doing somewhat better at this point in time during evaluation today. Nonetheless she still does have an opening mainly on the left side the right pretty much appears to be closed in my opinion. She does have pain on the left gluteal region. She also tells me about an area under the abdominal fold in the lower abdominal region which appears to be new just yesterday or today. This appears to potentially be a little bit of a fungal rash versus possibly friction from the way her husband has to  help her with transfer. No fevers, chills, nausea, or vomiting noted at this time. Patient has dementia and has a very difficult time sometimes following directions. Her husband does the best he can taking care for home but he states is difficult sometimes to get her to do anything but just sit either in her wheelchair or the recliner which is  probably why she's having such issues with her gluteal region. Nonetheless considering how much time she seems to spend sitting I'm pleased that the wounds are not any worse in appearance. 03/17/18 on evaluation today patient actually appears to be completely healed in regard to her ulcers on her gluteal region. She has been tolerating the dressing changes without complication. Her abdominal area also appears to be doing better there is no opening at the site either she continues to use the cream that was prescribed at the last visit. That is the next one to one of zinc oxide and nystatin cream. Overall this seems to be doing very well at this point in time. Readmission: 04/09/2019 on evaluation today patient appears to be doing somewhat more poorly. This is a patient whom I saw almost a year ago. She has had some discoloration in her gluteal region in general since that time. Again I think this is always seemingly due to excessive pressure to the area. She does have her husband with her who is actually her primary caregiver and does a very good job with her in my opinion. With that being said he obviously is somewhat frustrated in general with the fact that this keeps staying open he is also frustrated with the fact that she seems to not really want to stay in the bed to offload and lay on her side she likes to lay on her back he is having a very hard time finding a way to help her keep pressure off of the region. She does have dementia and this is playing a big role as well. Subsequently he did try to get her a different mattress to go underneath her chair  in order to help with offloading but she would not even give it a try. Subsequently he did try donut as well but that just she seems to be moving. He really cannot keep that in the right place. I suggested to him that a piece of memory foam may be better as far as not having to worry about exact positioning this would help with giving additional pressure offloading support. Electronic Signature(s) Signed: 04/09/2019 2:04:41 PM By: Worthy Keeler PA-C Entered By: Worthy Keeler on 04/09/2019 14:04:41 Fouse, Alia L. ([id number]) -------------------------------------------------------------------------------- Physical Exam Details Patient Name: BARBIER, Waynette L. Date of Service: 04/09/2019 1:15 PM Medical Record Number: [id number] Patient Account Number: [id number] Date of Birth/Sex: 08-Feb-1936 (83 y.o. F) Treating RN: Harold Barban Primary Care Provider: Raelyn Ensign Other Clinician: Referring Provider: Raelyn Ensign Treating Provider/Extender: STONE III, Jabes Primo Weeks in Treatment: 0 Constitutional sitting or standing blood pressure is within target range for patient.. pulse regular and within target range for patient.[name] respirations regular, non-labored and within target range for patient.[name] temperature within target range for patient.. Well- nourished and well-hydrated in no acute distress. Eyes conjunctiva clear no eyelid edema noted. pupils equal round and reactive to light and accommodation. Ears, Nose, Mouth, and Throat no gross abnormality of ear auricles or external auditory canals. normal hearing noted during conversation. mucus membranes moist. Respiratory normal breathing without difficulty. clear to auscultation bilaterally. Cardiovascular regular rate and rhythm with normal S1, S2. no clubbing, cyanosis, significant edema, <3 sec cap refill on the right. Gastrointestinal (GI) soft, non-tender, non-distended, +BS. no ventral hernia noted. Musculoskeletal Patient  unable to walk. Patient has a left below-knee amputation.[name] Psychiatric Patient is not able to cooperate in decision making regarding care. Patient has dementia. patient is confused. Notes Patient's wound bed currently showed signs of  good granulation at this time. Fortunately there does not appear to be any evidence of active infection at this time which is also good news. In fact the breakdown at this point appears to be more moisture associated skin damage right in the crease of the sacral region. She does have gluteal pressure evident by blanchable erythema at this time but again this does not seem to be too severe which is good news. Unfortunately she does not ambulate at this time which is a big part of her issue as well this is secondary to the left below-knee amputation. Electronic Signature(s) Signed: 04/09/2019 2:06:23 PM By: Worthy Keeler PA-C Entered By: Worthy Keeler on 04/09/2019 14:06:22 Yohannes, Aiza L. ([id number]) -------------------------------------------------------------------------------- Physician Orders Details Patient Name: SHAILAH, GIBBINS L. Date of Service: 04/09/2019 1:15 PM Medical Record Number: [id number] Patient Account Number: [id number] Date of Birth/Sex: March 31, 1936 (83 y.o. F) Treating RN: Harold Barban Primary Care Provider: Raelyn Ensign Other Clinician: Referring Provider: Raelyn Ensign Treating Provider/Extender: Melburn Hake, Rykin Route Weeks in Treatment: 0 Verbal / Phone Orders: No Diagnosis Coding ICD-10 Coding Code Description L98.8 Other specified disorders of the skin and subcutaneous tissue I10 Essential (primary) hypertension I73.89 Other specified peripheral vascular diseases Z89.512 Acquired absence of left leg below knee Wound Cleansing Wound #4 Midline Sacrum o Cleanse wound with mild soap and water Primary Wound Dressing Wound #4 Midline Sacrum o Hydrafera Blue Ready Transfer Secondary Dressing Wound #4 Midline  Sacrum o Boardered Foam Dressing Dressing Change Frequency Wound #4 Midline Sacrum o Change dressing every other day. Follow-up Appointments Wound #4 Midline Sacrum o Return Appointment in 1 week. Off-Loading o Turn and reposition every 2 hours Electronic Signature(s) Signed: 04/09/2019 4:27:39 PM By: Harold Barban Signed: 04/09/2019 4:45:50 PM By: Worthy Keeler PA-C Entered By: Harold Barban on 04/09/2019 13:58:44 Loose, Destyn L. ([id number]) -------------------------------------------------------------------------------- Problem List Details Patient Name: PLUMMER, Trinita L. Date of Service: 04/09/2019 1:15 PM Medical Record Number: [id number] Patient Account Number: [id number] Date of Birth/Sex: 1936-01-19 (83 y.o. F) Treating RN: Harold Barban Primary Care Provider: Raelyn Ensign Other Clinician: Referring Provider: Raelyn Ensign Treating Provider/Extender: Melburn Hake, Tyge Somers Weeks in Treatment: 0 Active Problems ICD-10 Evaluated Encounter Code Description Active Date Today Diagnosis L98.8 Other specified disorders of the skin and subcutaneous 04/09/2019 No Yes tissue I10 Essential (primary) hypertension 04/09/2019 No Yes I73.89 Other specified peripheral vascular diseases 04/09/2019 No Yes Z89.512 Acquired absence of left leg below knee 04/09/2019 No Yes Inactive Problems Resolved Problems Electronic Signature(s) Signed: 04/09/2019 1:44:29 PM By: Worthy Keeler PA-C Entered By: Worthy Keeler on 04/09/2019 13:44:28 Muench, Shilo L. ([id number]) -------------------------------------------------------------------------------- Progress Note Details Patient Name: RAHE, Laurencia L. Date of Service: 04/09/2019 1:15 PM Medical Record Number: [id number] Patient Account Number: [id number] Date of Birth/Sex: 1936-04-17 (83 y.o. F) Treating RN: Harold Barban Primary Care Provider: Raelyn Ensign Other Clinician: Referring Provider: Raelyn Ensign Treating Provider/Extender: Melburn Hake, Tonjia Parillo Weeks in Treatment: 0 Subjective Chief Complaint Information obtained from Patient Sacral ulcer History of Present Illness (HPI) The following HPI elements were documented for the patient's wound: Associated Signs and Symptoms: The patient has a history of chronic kidney disease, diabetes mellitus type II although her hemoglobin A1c most recent was 5.8 and she is on the medications. 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 She is incontinent. 03/10/18 on evaluation today patient actually presents for evaluation concerning her  bilateral gluteal ulcers which have been present for several weeks. She has been treated over time with different creams most recently polysporin. With that being said the patient actually seems to be doing somewhat better at this point in time during evaluation today. Nonetheless she still does have an opening mainly on the left side the right pretty much appears to be closed in my opinion. She does have pain on the left gluteal region. She also tells me about an area under the abdominal fold in the lower abdominal region which appears to be new just yesterday or today. This appears to potentially be a little bit of a fungal rash versus possibly friction from the way her husband has to help her with transfer. No fevers, chills, nausea, or vomiting noted at this time. Patient has dementia and has a very difficult time sometimes following directions. Her husband does the best he can taking care for home but he states is difficult sometimes to get her to do anything but just sit either in her wheelchair or the recliner which is probably why she's having such issues with her gluteal region. Nonetheless considering how much time she seems to spend sitting I'm pleased that the wounds are not any  worse in appearance. 03/17/18 on evaluation today patient actually appears to be completely healed in regard to her ulcers on her gluteal region. She has been tolerating the dressing changes without complication. Her abdominal area also appears to be doing better there is no opening at the site either she continues to use the cream that was prescribed at the last visit. That is the next one to one of zinc oxide and nystatin cream. Overall this seems to be doing very well at this point in time. Readmission: 04/09/2019 on evaluation today patient appears to be doing somewhat more poorly. This is a patient whom I saw almost a year ago. She has had some discoloration in her gluteal region in general since that time. Again I think this is always seemingly due to excessive pressure to the area. She does have her husband with her who is actually her primary caregiver and does a very good job with her in my opinion. With that being said he obviously is somewhat frustrated in general with the fact that this keeps staying open he is also frustrated with the fact that she seems to not really want to stay in the bed to offload and lay on her side she likes to lay on her back he is having a very hard time finding a way to help her keep pressure off of the region. She does have dementia and this is playing a big role as well. Subsequently he did try to get her a different mattress to go underneath her chair in order to help with offloading but she would not even give it a try. Subsequently he did try donut as well but that just she seems to be moving. He really cannot keep that in the right place. I suggested to him that a piece of memory foam may be better as far as not having to worry about exact positioning this would help with giving additional pressure offloading support. Patient History Unable to Obtain Patient History due to Dementia. Neuzil, Abena L. ([id number]) Information obtained from  Patient. Allergies morphine, penicillin Family History Cancer - Siblings,Mother, Diabetes - Siblings, No family history of Heart Disease, Hypertension, Kidney Disease, Lung Disease, Seizures, Stroke, Thyroid Problems, Tuberculosis. Social History Former smoker, Marital Status - Married,  Alcohol Use - Rarely, Drug Use - No History, Caffeine Use - Daily. Medical History Eyes Patient has history of Cataracts Denies history of Glaucoma, Optic Neuritis Ear/Nose/Mouth/Throat Denies history of Chronic sinus problems/congestion, Middle ear problems Hematologic/Lymphatic Patient has history of Anemia Denies history of Hemophilia, Human Immunodeficiency Virus, Lymphedema Respiratory Denies history of Aspiration, Asthma, Chronic Obstructive Pulmonary Disease (COPD), Pneumothorax, Sleep Apnea, Tuberculosis Cardiovascular Patient has history of Hypertension, Peripheral Arterial Disease Denies history of Angina, Arrhythmia, Congestive Heart Failure, Coronary Artery Disease, Deep Vein Thrombosis, Hypotension, Myocardial Infarction, Peripheral Venous Disease, Phlebitis, Vasculitis Gastrointestinal Denies history of Cirrhosis , Colitis, Crohn s, Hepatitis A, Hepatitis B, Hepatitis C Endocrine Patient has history of Type II Diabetes Denies history of Type I Diabetes Genitourinary Denies history of End Stage Renal Disease Immunological Denies history of Lupus Erythematosus, Raynaud s, Scleroderma Integumentary (Skin) Denies history of History of Burn, History of pressure wounds Musculoskeletal Patient has history of Osteoarthritis - hands Denies history of Gout, Rheumatoid Arthritis, Osteomyelitis Neurologic Patient has history of Dementia Denies history of Neuropathy, Quadriplegia, Paraplegia, Seizure Disorder Oncologic Patient has history of Received Radiation Denies history of Received Chemotherapy Psychiatric Denies history of Anorexia/bulimia, Confinement Anxiety Medical And Surgical  History Notes Respiratory Nebulizer Genitourinary Left Kidney Removed Oncologic Left Kidney Kammerer, Brent L. ([id number]) Review of Systems (ROS) Oncologic Stomach cancer Objective Constitutional sitting or standing blood pressure is within target range for patient.. pulse regular and within target range for patient.[name] respirations regular, non-labored and within target range for patient.[name] temperature within target range for patient.. Well- nourished and well-hydrated in no acute distress. Vitals Time Taken: 1:18 PM, Height: 62 in, Weight: 154 lbs, BMI: 28.2, Temperature: 97.7 F, Pulse: 89 bpm, Respiratory Rate: 16 breaths/min, Blood Pressure: 130/97 mmHg, Pulse Oximetry: 99 %. Eyes conjunctiva clear no eyelid edema noted. pupils equal round and reactive to light and accommodation. Ears, Nose, Mouth, and Throat no gross abnormality of ear auricles or external auditory canals. normal hearing noted during conversation. mucus membranes moist. Respiratory normal breathing without difficulty. clear to auscultation bilaterally. Cardiovascular regular rate and rhythm with normal S1, S2. no clubbing, cyanosis, significant edema, Gastrointestinal (GI) soft, non-tender, non-distended, +BS. no ventral hernia noted. Musculoskeletal Patient unable to walk. Patient has a left below-knee amputation.[name] Psychiatric Patient is not able to cooperate in decision making regarding care. Patient has dementia. patient is confused. General Notes: Patient's wound bed currently showed signs of good granulation at this time. Fortunately there does not appear to be any evidence of active infection at this time which is also good news. In fact the breakdown at this point appears to be more moisture associated skin damage right in the crease of the sacral region. She does have gluteal pressure evident by blanchable erythema at this time but again this does not seem to be too severe which is good news.  Unfortunately she does not ambulate at this time which is a big part of her issue as well this is secondary to the left below-knee amputation. Integumentary (Hair, Skin) Wound #4 status is Open. Original cause of wound was Gradually Appeared. The wound is located on the Midline Sacrum. The wound measures 1.8cm length x 0.2cm width x 0.1cm depth; 0.283cm^2 area and 0.028cm^3 volume. The wound is limited to skin breakdown. There is no tunneling or undermining noted. There is a medium amount of serous drainage noted. The wound margin is indistinct and nonvisible. There is large (67-100%) granulation within the wound bed. There is no necrotic tissue within the  wound bed. Dellarocco, Melaine L. ([id number]) Assessment Active Problems ICD-10 Other specified disorders of the skin and subcutaneous tissue Essential (primary) hypertension Other specified peripheral vascular diseases Acquired absence of left leg below knee Procedures Wound #4 Pre-procedure diagnosis of Wound #4 is a MASD located on the Midline Sacrum . There was a Chemical/Enzymatic/Mechanical debridement performed by STONE III, Vamsi Apfel E., PA-C. With the following instrument(s): saline and gauze after achieving pain control using Lidocaine. Other agent used was saline and gauze. A time out was conducted at 13:50, prior to the start of the procedure. There was no bleeding. The procedure was tolerated well with a pain level of 0 throughout and a pain level of 0 following the procedure. Post Debridement Measurements: 1.8cm length x 0.2cm width x 0.1cm depth; 0.028cm^3 volume. Character of Wound/Ulcer Post Debridement is improved. Post procedure Diagnosis Wound #4: Same as Pre-Procedure Plan Wound Cleansing: Wound #4 Midline Sacrum: Cleanse wound with mild soap and water Primary Wound Dressing: Wound #4 Midline Sacrum: Hydrafera Blue Ready Transfer Secondary Dressing: Wound #4 Midline Sacrum: Boardered Foam Dressing Dressing Change  Frequency: Wound #4 Midline Sacrum: Change dressing every other day. Follow-up Appointments: Wound #4 Midline Sacrum: Return Appointment in 1 week. Off-Loading: Turn and reposition every 2 hours Grieco, Sahvannah L. ([id number]) 1. My suggestion at this time is can be that we attempt Hydrofera Blue to see if this will help keep the area from becoming too moist and I would recommend covering this with a border foam dressing. 2. I am also going to recommend appropriate offloading we discussed again today possible ways to aid in this 1 being that they could definitely obtain a memory foam piece in order to put under her when she is sitting in her recliner or in her chair this will possibly help with offloading better than what they have attempted up to this time. 3. I also believe that the patient really should spend some time during the day in the bed laying on one side and the other to appropriately offload this will help with allowing this area to heal as well. She really should not stand longer than 2 hours in any 1 position. We will see patient back for reevaluation in 1 week here in the clinic. If anything worsens or changes patient will contact our office for additional recommendations. Electronic Signature(s) Signed: 04/09/2019 2:07:21 PM By: Worthy Keeler PA-C Entered By: Worthy Keeler on 04/09/2019 14:07:20 Lauture, Orville L. ([id number]) -------------------------------------------------------------------------------- ROS/PFSH Details Patient Name: ADDASYN, MCBREEN L. Date of Service: 04/09/2019 1:15 PM Medical Record Number: [id number] Patient Account Number: [id number] Date of Birth/Sex: [birth year] (83 y.o. F) Treating RN: Cornell Barman Primary Care Provider: Raelyn Ensign Other Clinician: Referring Provider: Raelyn Ensign Treating Provider/Extender: Melburn Hake, Damario Gillie Weeks in Treatment: 0 Unable to Obtain Patient History due to oo Dementia Information Obtained  From Patient Eyes Medical History: Positive for: Cataracts Negative for: Glaucoma; Optic Neuritis Ear/Nose/Mouth/Throat Medical History: Negative for: Chronic sinus problems/congestion; Middle ear problems Hematologic/Lymphatic Medical History: Positive for: Anemia Negative for: Hemophilia; Human Immunodeficiency Virus; Lymphedema Respiratory Medical History: Negative for: Aspiration; Asthma; Chronic Obstructive Pulmonary Disease (COPD); Pneumothorax; Sleep Apnea; Tuberculosis Past Medical History Notes: Nebulizer Cardiovascular Medical History: Positive for: Hypertension; Peripheral Arterial Disease Negative for: Angina; Arrhythmia; Congestive Heart Failure; Coronary Artery Disease; Deep Vein Thrombosis; Hypotension; Myocardial Infarction; Peripheral Venous Disease; Phlebitis; Vasculitis Gastrointestinal Medical History: Negative for: Cirrhosis ; Colitis; Crohnos; Hepatitis A; Hepatitis B; Hepatitis C Endocrine Medical History: Positive for: Type II Diabetes Negative for:  Type I Diabetes Time with diabetes: 7 years Treated with: Diet Boroff, Zarina L. ([id number]) Blood sugar tested every day: No Genitourinary Medical History: Negative for: End Stage Renal Disease Past Medical History Notes: Left Kidney Removed Immunological Medical History: Negative for: Lupus Erythematosus; Raynaudos; Scleroderma Integumentary (Skin) Medical History: Negative for: History of Burn; History of pressure wounds Musculoskeletal Medical History: Positive for: Osteoarthritis - hands Negative for: Gout; Rheumatoid Arthritis; Osteomyelitis Neurologic Medical History: Positive for: Dementia Negative for: Neuropathy; Quadriplegia; Paraplegia; Seizure Disorder Oncologic Complaints and Symptoms: Review of System Notes: Stomach cancer Medical History: Positive for: Received Radiation Negative for: Received Chemotherapy Past Medical History Notes: Left Kidney Psychiatric Medical  History: Negative for: Anorexia/bulimia; Confinement Anxiety HBO Extended History Items Eyes: Cataracts Immunizations Pneumococcal Vaccine: Received Pneumococcal Vaccination: Yes Implantable Devices No devices added Crumpacker, Meenakshi L. ([id number]) Family and Social History Cancer: Yes - Siblings,Mother; Diabetes: Yes - Siblings; Heart Disease: No; Hypertension: No; Kidney Disease: No; Lung Disease: No; Seizures: No; Stroke: No; Thyroid Problems: No; Tuberculosis: No; Former smoker; Marital Status - Married; Alcohol Use: Rarely; Drug Use: No History; Caffeine Use: Daily Electronic Signature(s) Signed: 04/09/2019 4:39:56 PM By: Gretta Cool, BSN, RN, CWS, Kim RN, BSN Signed: 04/09/2019 4:45:50 PM By: Worthy Keeler PA-C Entered By: Gretta Cool, BSN, RN, CWS, Kim on 04/09/2019 13:31:07 Vari, Natalynn L. ([id number]) -------------------------------------------------------------------------------- SuperBill Details Patient Name: DORAL, DIGANGI L. Date of Service: 04/09/2019 Medical Record Number: [id number] Patient Account Number: [id number] Date of Birth/Sex: December 03, 1935 (83 y.o. F) Treating RN: Harold Barban Primary Care Provider: Raelyn Ensign Other Clinician: Referring Provider: Raelyn Ensign Treating Provider/Extender: Melburn Hake, Vaishnav Demartin Weeks in Treatment: 0 Diagnosis Coding ICD-10 Codes Code Description L98.8 Other specified disorders of the skin and subcutaneous tissue I10 Essential (primary) hypertension I73.89 Other specified peripheral vascular diseases Z89.512 Acquired absence of left leg below knee Facility Procedures CPT4 Code: 62952841 Description: 99213 - WOUND CARE VISIT-LEV 3 EST PT Modifier: Quantity: 1 Physician Procedures CPT4 Code: 3244010 Description: 27253 - WC PHYS LEVEL 4 - EST PT ICD-10 Diagnosis Description L98.8 Other specified disorders of the skin and subcutaneous t I10 Essential (primary) hypertension I73.89 Other specified peripheral vascular diseases  Z89.512 Acquired absence of  left leg below knee Modifier: issue Quantity: 1 Electronic Signature(s) Signed: 04/09/2019 2:07:42 PM By: Worthy Keeler PA-C Entered By: Worthy Keeler on 04/09/2019 14:07:41

## 2019-04-10 DIAGNOSIS — L89159 Pressure ulcer of sacral region, unspecified stage: Secondary | ICD-10-CM | POA: Diagnosis not present

## 2019-04-16 ENCOUNTER — Encounter: Payer: Medicare HMO | Admitting: Physician Assistant

## 2019-04-16 ENCOUNTER — Other Ambulatory Visit: Payer: Self-pay

## 2019-04-16 DIAGNOSIS — N189 Chronic kidney disease, unspecified: Secondary | ICD-10-CM | POA: Diagnosis not present

## 2019-04-16 DIAGNOSIS — L98421 Non-pressure chronic ulcer of back limited to breakdown of skin: Secondary | ICD-10-CM | POA: Diagnosis not present

## 2019-04-16 DIAGNOSIS — M545 Low back pain: Secondary | ICD-10-CM | POA: Diagnosis not present

## 2019-04-16 DIAGNOSIS — E1122 Type 2 diabetes mellitus with diabetic chronic kidney disease: Secondary | ICD-10-CM | POA: Diagnosis not present

## 2019-04-16 DIAGNOSIS — L89159 Pressure ulcer of sacral region, unspecified stage: Secondary | ICD-10-CM | POA: Diagnosis not present

## 2019-04-16 DIAGNOSIS — E1151 Type 2 diabetes mellitus with diabetic peripheral angiopathy without gangrene: Secondary | ICD-10-CM | POA: Diagnosis not present

## 2019-04-16 DIAGNOSIS — F0390 Unspecified dementia without behavioral disturbance: Secondary | ICD-10-CM | POA: Diagnosis not present

## 2019-04-16 DIAGNOSIS — E11622 Type 2 diabetes mellitus with other skin ulcer: Secondary | ICD-10-CM | POA: Diagnosis not present

## 2019-04-16 DIAGNOSIS — I129 Hypertensive chronic kidney disease with stage 1 through stage 4 chronic kidney disease, or unspecified chronic kidney disease: Secondary | ICD-10-CM | POA: Diagnosis not present

## 2019-04-24 ENCOUNTER — Encounter: Payer: Self-pay | Admitting: Student in an Organized Health Care Education/Training Program

## 2019-04-25 ENCOUNTER — Encounter: Payer: Self-pay | Admitting: Student in an Organized Health Care Education/Training Program

## 2019-04-25 ENCOUNTER — Other Ambulatory Visit: Payer: Self-pay

## 2019-04-25 ENCOUNTER — Ambulatory Visit
Payer: Medicare HMO | Attending: Student in an Organized Health Care Education/Training Program | Admitting: Student in an Organized Health Care Education/Training Program

## 2019-04-25 DIAGNOSIS — N184 Chronic kidney disease, stage 4 (severe): Secondary | ICD-10-CM

## 2019-04-25 DIAGNOSIS — F1120 Opioid dependence, uncomplicated: Secondary | ICD-10-CM | POA: Diagnosis not present

## 2019-04-25 DIAGNOSIS — Z89612 Acquired absence of left leg above knee: Secondary | ICD-10-CM | POA: Diagnosis not present

## 2019-04-25 DIAGNOSIS — F0281 Dementia in other diseases classified elsewhere with behavioral disturbance: Secondary | ICD-10-CM | POA: Diagnosis not present

## 2019-04-25 DIAGNOSIS — G4701 Insomnia due to medical condition: Secondary | ICD-10-CM | POA: Diagnosis not present

## 2019-04-25 DIAGNOSIS — G301 Alzheimer's disease with late onset: Secondary | ICD-10-CM | POA: Diagnosis not present

## 2019-04-25 DIAGNOSIS — G894 Chronic pain syndrome: Secondary | ICD-10-CM | POA: Diagnosis not present

## 2019-04-25 DIAGNOSIS — Z905 Acquired absence of kidney: Secondary | ICD-10-CM

## 2019-04-25 DIAGNOSIS — G546 Phantom limb syndrome with pain: Secondary | ICD-10-CM | POA: Diagnosis not present

## 2019-04-25 DIAGNOSIS — F02818 Dementia in other diseases classified elsewhere, unspecified severity, with other behavioral disturbance: Secondary | ICD-10-CM

## 2019-04-25 MED ORDER — HYDROCODONE-ACETAMINOPHEN 7.5-325 MG PO TABS: 1.0000 | ORAL_TABLET | Freq: Three times a day (TID) | ORAL | 0 refills | Status: AC | PRN

## 2019-04-25 MED ORDER — HYDROCODONE-ACETAMINOPHEN 7.5-325 MG PO TABS: 1.0000 | ORAL_TABLET | Freq: Three times a day (TID) | ORAL | 0 refills | Status: DC | PRN

## 2019-04-25 MED ORDER — MELATONIN 10 MG PO CAPS: 10.0000 mg | ORAL_CAPSULE | Freq: Every evening | ORAL | 2 refills | Status: AC | PRN

## 2019-04-25 NOTE — Progress Notes (Signed)
Pain Management Virtual Encounter Note - Virtual Visit via [place] (real-time audio visits between healthcare provider and patient).   Patient's Phone No. & Preferred Pharmacy:  [phone] (home); [phone] (mobile); (Preferred) [phone] [email or web address]  [facility] - [place], [place] [address] Phone: [phone] Fax: [phone]  [facility] - [place], [place] [address] Phone: [phone] Fax: [phone]    Pre-screening note:  Our staff contacted Ms. Butler and offered her an "in person", "face-to-face" appointment versus a telephone encounter. She indicated preferring the telephone encounter, at this time.   Reason for Virtual Visit: COVID-19*  Social distancing based on CDC and AMA recommendations.   I contacted Chales Abrahams on 04/25/2019 via video conference.      I clearly identified myself as Gillis Santa, MD. I verified that I was speaking with the correct person using two identifiers (Name: Amy [name], and date of birth: 10-10-1935).  Advanced Informed Consent I sought verbal advanced consent from Chales Abrahams for virtual visit interactions. I informed Ms. Talwar of possible security and privacy concerns, risks, and limitations associated with providing "not-in-person" medical evaluation and management services. I also informed Ms. Bula of the availability of "in-person" appointments. Finally, I informed her that there would be a charge for the virtual visit and that she could be  personally, fully or partially, financially responsible for it. Ms. Bickle expressed understanding and agreed to proceed.   Historic Elements   Ms. JUNE RODE is a 83 y.o. year old, female patient evaluated today after her last encounter by our practice on 03/19/2019. Ms. Barwick  has a past medical  history of Anemia, Anxiety, Arthritis, Bilateral cataracts, Bronchitis, Cataract, Chronic kidney disease, Depression, Diabetes mellitus without complication ([place]), DVT (deep venous thrombosis) ([place]), Emphysema of lung ([place]), GERD (gastroesophageal reflux disease), Headache, History of frequent urinary tract infections, renal cell cancer, Hyperlipidemia, Hypertension, Numbness and tingling, Obesity, Osteopenia (10/05/2017), Peripheral artery disease ([place]), Urinary frequency, and Urinary incontinence. She also  has a past surgical history that includes Leg amputation; Abdominal hysterectomy (1978); Cholecystectomy; Carpal tunnel release (Bilateral); stent placement right leg ; Robot assisted laparoscopic nephrectomy (Left, 01/15/2015); Colonoscopy; Colonoscopy with esophagogastroduodenoscopy (egd); ERCP (N/A, 08/19/2017); and Esophagogastroduodenoscopy (egd) with propofol (N/A, 10/12/2018). Ms. Rudell has a current medication list which includes the following prescription(s): acetaminophen, albuterol, vitamin c, atorvastatin, buspirone, clopidogrel, donepezil, famotidine, ferrous sulfate, fluoxetine, gabapentin, hydrocodone-acetaminophen, hydrocodone-acetaminophen, ipratropium-albuterol, lidocaine, pantoprazole, quetiapine, vitamin b-1, and melatonin. She  reports that she quit smoking about 14 years ago. Her smoking use included cigarettes. She has a 76.50 pack-year smoking history. She has never used smokeless tobacco. She reports previous alcohol use. She reports that she does not use drugs. Ms. Baity is allergic to fentanyl and penicillins.   HPI  Today, she is being contacted for medication management.  Virtual visit today for medication management.  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 She has been going for radiation therapy, palliative for her left eighth  rib.  She has also been endorsing increased bilateral hand pain since radiation therapy.  Has utilize lidocaine patches for her rib pain which she states are somewhat helpful.  Is requesting something for sleep.  We discussed melatonin that she could take at night.  Monitoring: Pharmacotherapy: No side-effects or adverse reactions reported. [place] PMP: PDMP reviewed during this encounter.       Compliance: No problems identified. Effectiveness: Clinically acceptable. Plan: Refer to "POC".   Laboratory Chemistry Profile (12 mo)  Renal: 07/12/2018: BUN/Creatinine Ratio 9 03/18/2019: BUN 30; Creatinine, Ser 2.11  Lab Results  Component Value Date   GFRAA 25 (L) 03/18/2019   GFRNONAA 21 (L) 03/18/2019   Hepatic: 03/18/2019: Albumin 3.6 Lab Results  Component Value Date   AST 16 03/18/2019   ALT 13 03/18/2019   Other: 02/22/2019: Vitamin B-12 298 Note: Above Lab results reviewed.  Imaging  CT Chest Wo Contrast CLINICAL DATA:  Left chest wall pain. History of left renal cell carcinoma status post left nephrectomy in 2016. History of gastric cancer. Known left eighth rib hypermetabolic metastasis as seen on recent PET-CT.  EXAM: CT CHEST WITHOUT CONTRAST  TECHNIQUE: Multidetector CT imaging of the chest was performed following the standard protocol without IV contrast.  COMPARISON:  02/28/2019 PET-CT.  02/15/2027 chest CT.  FINDINGS: Cardiovascular: Normal heart size. No significant pericardial effusion/thickening. Left anterior descending and right coronary atherosclerosis. Atherosclerotic nonaneurysmal thoracic aorta. Top-normal caliber main pulmonary artery (3.3 cm diameter), stable.  Mediastinum/Nodes: No discrete thyroid nodules. Unremarkable esophagus. No axillary adenopathy. Right paratracheal adenopathy up to 1.4 cm (series 2/image 59), stable. No new pathologically enlarged mediastinal nodes. No discrete hilar adenopathy on this noncontrast  scan.  Lungs/Pleura: No pneumothorax. No pleural effusion. Moderate centrilobular emphysema with diffuse bronchial wall thickening. Stable masslike irregular focus of consolidation in the posterior right lower lobe measuring 3.5 x 1.3 cm (series 3/image 105) with associated surrounding parenchymal banding, distortion and volume loss, favored to represent rounded atelectasis on prior PET-CT. Solid basilar right upper lobe 1.4 cm subpleural pulmonary nodule (series 3/image 91), minimally increased from 1.3 cm on 02/15/2019 chest CT. Solid medial left lower lobe 1.2 cm pulmonary nodule (series 3/image 99), minimally increased from 1.1 cm. Sub solid medial left upper lobe 1.1 cm pulmonary nodule (series 3/image 35), minimally increased from 1.0 cm. No acute consolidative airspace disease. No new significant pulmonary nodules. Platelike scarring versus atelectasis in the posterior right middle lobe and basilar left lower lobe.  Upper abdomen: Cholecystectomy. Partially visualized simple right renal cysts, largest 8 cm anteriorly. Partially visualized left nephrectomy.  Musculoskeletal: Expansile lytic lateral left eighth rib 2.3 cm lesion (series 3/image 92), mildly increased from 1.9 cm on 02/15/2019 chest CT. No new focal osseous lesions. Chronic severe T12 vertebral compression fracture. Moderate thoracic spondylosis.  IMPRESSION: 1. Mild growth of expansile lytic lateral left eighth rib metastasis. 2. Slight growth bilateral pulmonary nodules suspicious for pulmonary metastases. 3. Stable mild mediastinal lymphadenopathy. 4. Coronary atherosclerosis.  Aortic Atherosclerosis (ICD10-I70.0) and Emphysema (ICD10-J43.9).  Electronically Signed   By: Ilona Sorrel M.D.   On: 03/18/2019 17:33   Assessment  The primary encounter diagnosis was Chronic pain syndrome. Diagnoses of Phantom limb syndrome with pain ([place]), S/P AKA (above knee amputation) unilateral, left (HCC), Continuous  opioid dependence ([place]), S/p nephrectomy, CKD (chronic kidney disease) stage 4, GFR 15-29 ml/min (HCC), 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 ([place]), Late onset Alzheimer's disease with behavioral disturbance ([place]), and Insomnia due to medical condition were also pertinent to this visit.  Plan of Care   I have changed Ronesha L. Orrico's HYDROcodone-acetaminophen. I am also having her start on HYDROcodone-acetaminophen and Melatonin. Additionally, I am having her maintain her donepezil, ipratropium-albuterol, FLUoxetine, vitamin C, clopidogrel, albuterol, acetaminophen, vitamin B-1, gabapentin,  ferrous sulfate, atorvastatin, famotidine, pantoprazole, QUEtiapine, lidocaine, and busPIRone.  Pharmacotherapy (Medications Ordered): Meds ordered this encounter  Medications  . HYDROcodone-acetaminophen (NORCO) 7.5-325 MG tablet    Sig: Take 1 tablet by mouth every 8 (eight) hours as needed. For chronic pain.  Okay to fill 1 day early if pharmacy closed on fill date.    Dispense:  90 tablet    Refill:  0  . HYDROcodone-acetaminophen (NORCO) 7.5-325 MG tablet    Sig: Take 1 tablet by mouth every 8 (eight) hours as needed. For chronic pain.  Okay to fill 1 day early pharmacy closed on fill date    Dispense:  90 tablet    Refill:  0  . Melatonin 10 MG CAPS    Sig: Take 10 mg by mouth at bedtime as needed (sleep).    Dispense:  30 capsule    Refill:  2    Do not add to the electronic "Automatic Refill" notification system. Patient may have prescription filled one day early if pharmacy is closed on scheduled refill date.  Follow-up plan:   Return in about 3 months (around 07/24/2019) for Medication Management, virtual.    Recent Visits Date Type Provider Dept  02/20/19 Office Visit Gillis Santa, MD [facility]  Showing recent visits within past 90 days and meeting all other requirements   Today's Visits Date Type Provider Dept  04/25/19 Office Visit Gillis Santa, MD [facility]  Showing today's visits and meeting all other requirements   Future Appointments No visits were found meeting these conditions.  Showing future appointments within next 90 days and meeting all other requirements   I discussed the assessment and treatment plan with the patient. The patient was provided an opportunity to ask questions and all were answered. The patient agreed with the plan and demonstrated an understanding of the instructions.  Patient advised to call back or seek an in-person evaluation if the symptoms or condition worsens.  Total duration of non-face-to-face encounter: 25 minutes.  Note by: Gillis Santa, MD Date: 04/25/2019; Time: 2:11 PM  Note: This dictation was prepared with Dragon dictation. Any transcriptional errors that may result from this process are unintentional.  Disclaimer:  * Given the special circumstances of the COVID-19 pandemic, the federal government has announced that the Office for Civil Rights (OCR) will exercise its enforcement discretion and will not impose penalties on physicians using telehealth in the event of noncompliance with regulatory requirements under the [place] (HIPAA) in connection with the good faith provision of telehealth during the XBJYN-82 national public health emergency. ([place])

## 2019-04-26 ENCOUNTER — Other Ambulatory Visit: Payer: Medicare HMO | Admitting: Primary Care

## 2019-04-26 ENCOUNTER — Other Ambulatory Visit: Payer: Self-pay

## 2019-04-26 DIAGNOSIS — Z515 Encounter for palliative care: Secondary | ICD-10-CM

## 2019-04-26 NOTE — Progress Notes (Signed)
[facility] Consult Note Telephone: [phone]  Fax: [phone]   PATIENT NAME: Amy [name] [address] [place] [address] [phone] (home)  DOB: Jul 29, 1935 MRN: [id number]  PRIMARY CARE PROVIDER:   [name], [place], [address] [place] [address] [phone]  REFERRING PROVIDER:  [name], [place],  [place] [address] [phone]  RESPONSIBLE PARTY:   Extended Emergency Contact Information Primary Emergency Contact: [facility] Address: [address]          [place], [place] [address] Johnnette Litter of [place] Phone: [phone] Mobile Phone: [phone] Relation: Spouse Secondary Emergency Contact: Marthena, [place] Mobile Phone: [phone] Relation: Son   ASSESSMENT AND RECOMMENDATIONS:   1. Advance Care Planning/Goals of Care: Goals include to maximize quality of life and symptom management.  Has DNR on chart. We did MOST today with DNR, Limited scope, use of antibiotics and fluids, no feeding tube. Patient and husband participated, and while patient made her wishes known, she asked her husband to sign. Uploaded to [facility].  2. Symptom Management:   Pain: Taking gabapentin and norco tid. This seems to be working well and she is generally comfortable.  Radiation treatments seemed to have helped pain in rib. States the oncologist stated bone lesions were gone.   Sleep: Not sleeping well, Uses sleep aids, new on melatonin.   Skin Care: Using Desitin Max with zinc and lanolin,  and offloading pressure. Had used aloe in the past and could try this again.  We discussed skin care and important need to off load. Renal function is such that extra protein would not be recommended.   Caregiver: Husband remains care giver but seems to be doing better with caregiver strain. Over the summer there was a caregiver crisis with him being overwhelmed.[name] He states she sometimes  cooperates with transfers and some times does not. Has a Psychologist, prison and probation services one day a week, and has asked for another day from this worker when she frees up. Has  A full bath weekly with caregiver and he does sponge baths other days. He states she seems to be more comfortable now.  Lift: Has a hoyer lift but does not use it because it is onerous,  as long as he can transfer her. This is getting harder and harder however and we discussed him preserving his health as well and reducing injury risks by using lift.  3. Family /Caregiver/Community Supports: Lives with husband. Has a son who  Is not helpful and lives in her home without paying rent. Husband  states patient allows this and he would like him to pay rent. Currently they are paying for any additional care givers.   4. Cognitive / Functional decline: Alert, oriented x 2-3. Appears relaxed today, last visit she was anxious. Appears at functional baseline or better / improved due to better pain control.   5. Follow up Palliative Care Visit: Palliative care will continue to follow for goals of care clarification and symptom management. Return 6-7 weeks or prn.  I spent 60 minutes providing this consultation,  from 1200 to 1300. More than 50% of the time in this consultation was spent coordinating communication.   HISTORY OF PRESENT ILLNESS:  Amy [name] is a 83 y.o. year old female with multiple medical problems including metastatic cancer, cognitive impairment, DM, L AKA, COPD, obesity. Palliative Care was asked to follow this patient by consultation request of [name], FNP  to help address advance care planning and goals of care. This is a follow up visit.  CODE STATUS: DNR, Limited scope, use of antibiotics and fluids, no feeding tube.   PPS: 30% HOSPICE ELIGIBILITY/DIAGNOSIS: TBD  PAST MEDICAL HISTORY:  Past Medical History:  Diagnosis Date  . Anemia    low iron  . Anxiety   . Arthritis   . Bilateral cataracts   .  Bronchitis    hx of   . Cataract   . Chronic kidney disease   . Depression   . Diabetes mellitus without complication ([place])   . 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 Emphysema of lung ([place])   . GERD (gastroesophageal reflux disease)   . Headache   . History of frequent urinary tract infections   . Hx of renal cell cancer    LEFT  . Hyperlipidemia   . Hypertension   . Numbness and tingling    right hand   . Obesity   . Osteopenia 10/05/2017  . Peripheral artery disease ([place])   . Urinary frequency   . Urinary incontinence     SOCIAL HX:  Social History   Tobacco Use  . Smoking status: Former Smoker    Packs/day: 1.50    Years: 51.00    Pack years: 76.50    Types: Cigarettes    Quit date: 05/24/2004    Years since quitting: 14.9  . Smokeless tobacco: Never Used  . Tobacco comment: smoking cessation materials not required  Substance Use Topics  . Alcohol use: Not Currently    ALLERGIES:  Allergies  Allergen Reactions  . Fentanyl Other (See Comments)    urine retention  . Penicillins Itching, Rash and Other (See Comments)    Has patient had a PCN reaction causing immediate rash, facial/tongue/throat swelling, SOB or lightheadedness with hypotension: Yes Has patient had a PCN reaction causing severe rash involving mucus membranes or skin necrosis: No Has patient had a PCN reaction that required hospitalization: No Has patient had a PCN reaction occurring within the last 10 years: Yes If all of the above answers are "NO", then may proceed with Cephalosporin use.     PERTINENT MEDICATIONS:  Outpatient Encounter Medications as of 04/26/2019  Medication Sig  . acetaminophen (TYLENOL) 325 MG tablet Take 325 mg by mouth every 6 (six) hours as needed.  [name] albuterol (VENTOLIN HFA) 108 (90 Base) MCG/ACT inhaler Inhale 1-2 puffs into the lungs every 4 (four) hours as needed.   . Ascorbic Acid (VITAMIN C) 1000 MG tablet Take 1,000 mg by  mouth daily.  [name] atorvastatin (LIPITOR) 20 MG tablet Take 1 tablet (20 mg total) by mouth daily at 6 PM.  . busPIRone (BUSPAR) 10 MG tablet Take 10 mg by mouth 3 (three) times daily.  . clopidogrel (PLAVIX) 75 MG tablet TAKE 1 TABLET EVERY DAY (Patient taking differently: Take 75 mg by mouth daily. )  . donepezil (ARICEPT) 10 MG tablet Take 1 tablet (10 mg total) by mouth at bedtime.  . famotidine (PEPCID) 20 MG tablet Take 1 tablet (20 mg total) by mouth daily.  . ferrous sulfate 325 (65 FE) MG tablet Take 325 mg by mouth daily with breakfast. Pt taking every other day  . FLUoxetine (PROZAC) 10 MG capsule Take 1 capsule (10 mg total) by mouth daily.  [name] gabapentin (NEURONTIN) 300 MG capsule Take 1 capsule (300 mg total) by mouth 3 (three)  times daily.  [name] ON 06/04/2019] HYDROcodone-acetaminophen (NORCO) 7.5-325 MG tablet Take 1 tablet by mouth every 8 (eight) hours as needed. For chronic pain.  Okay to fill 1 day early if pharmacy closed on fill date.  [name] ON 07/04/2019] HYDROcodone-acetaminophen (NORCO) 7.5-325 MG tablet Take 1 tablet by mouth every 8 (eight) hours as needed. For chronic pain.  Okay to fill 1 day early pharmacy closed on fill date  . ipratropium-albuterol (DUONEB) 0.5-2.5 (3) MG/3ML SOLN INHALE THE CONTENTS OF 1 VIAL VIA NEBULIZER THREE TIMES DAILY AS DIRECTED  . lidocaine (LIDODERM) 5 % Place 1 patch onto the skin daily. Remove & Discard patch within 12 hours or as directed by MD  . Melatonin 10 MG CAPS Take 10 mg by mouth at bedtime as needed (sleep).  . pantoprazole (PROTONIX) 40 MG tablet Take 1 tablet (40 mg total) by mouth daily.  . QUEtiapine (SEROQUEL) 25 MG tablet TAKE 1 TABLET EVERY MORNING, 1 TABLET EVERY AFTERNOON, AND 2 TABLETS BEFORE BEDTIME.  [name] Thiamine HCl (VITAMIN B-1) 250 MG tablet Take 250 mg by mouth daily.   No facility-administered encounter medications on file as of 04/26/2019.     PHYSICAL EXAM / ROS:   Current and past weights: no recent  available. Albumin in 10/20 was 3.6. General: NAD, frail appearing, obese Cardiovascular: no chest pain reported, slight R LE edema noted Pulmonary: no cough, no increased SOB, DOE, on oxygen 2 l / [place] Abdomen: appetite fair to good , denies constipation, incontinent of bowel GU: denies dysuria, incontinent of urine MSK:  Left AKA, non ambulatory, husband transfers to w/c, recliner, bed. Has a hoyer lift but does not use it much. Skin: sacral breakdown, chronic, stage 2 Neurological: Weakness, dementia, anxiety  Jason Coop, NP  COVID-19 PATIENT SCREENING TOOL  Person answering questions: ___________[name]________ _____   1.  Is the patient or any family member in the home showing any signs or symptoms regarding respiratory infection?               Person with Symptom- _________NA__________________  a. Fever                                                                          Yes___ No___          ___________________  b. Shortness of breath                                                    Yes___ No___          ___________________ c. Cough/congestion                                       Yes___  No___         ___________________ d. Body aches/pains  Yes___ No___        ____________________ e. Gastrointestinal symptoms (diarrhea, nausea)           Yes___ No___        ____________________  2. Within the past 14 days, has anyone living in the home had any contact with someone with or under investigation for COVID-19?    Yes___ No_x_   Person __________________

## 2019-04-27 DIAGNOSIS — M79641 Pain in right hand: Secondary | ICD-10-CM | POA: Diagnosis not present

## 2019-04-30 ENCOUNTER — Ambulatory Visit: Payer: Medicare HMO | Admitting: Family Medicine

## 2019-05-02 DIAGNOSIS — J449 Chronic obstructive pulmonary disease, unspecified: Secondary | ICD-10-CM | POA: Diagnosis not present

## 2019-05-03 DIAGNOSIS — J9601 Acute respiratory failure with hypoxia: Secondary | ICD-10-CM | POA: Diagnosis not present

## 2019-05-08 ENCOUNTER — Other Ambulatory Visit: Payer: Self-pay | Admitting: Emergency Medicine

## 2019-05-08 DIAGNOSIS — F0281 Dementia in other diseases classified elsewhere with behavioral disturbance: Secondary | ICD-10-CM

## 2019-05-08 DIAGNOSIS — F02818 Dementia in other diseases classified elsewhere, unspecified severity, with other behavioral disturbance: Secondary | ICD-10-CM

## 2019-05-08 MED ORDER — DONEPEZIL HCL 10 MG PO TABS: 10.0000 mg | ORAL_TABLET | Freq: Every day | ORAL | 3 refills | Status: AC

## 2019-05-09 ENCOUNTER — Ambulatory Visit: Admission: RE | Admit: 2019-05-09 | Payer: Medicare HMO | Source: Ambulatory Visit | Admitting: Radiation Oncology

## 2019-05-10 ENCOUNTER — Ambulatory Visit: Payer: Medicare HMO | Admitting: Student in an Organized Health Care Education/Training Program

## 2019-05-22 ENCOUNTER — Other Ambulatory Visit: Payer: Self-pay

## 2019-05-22 ENCOUNTER — Ambulatory Visit (INDEPENDENT_AMBULATORY_CARE_PROVIDER_SITE_OTHER): Payer: Medicare HMO | Admitting: Family Medicine

## 2019-05-22 ENCOUNTER — Telehealth: Payer: Self-pay | Admitting: Family Medicine

## 2019-05-22 ENCOUNTER — Encounter: Payer: Self-pay | Admitting: Family Medicine

## 2019-05-22 DIAGNOSIS — G301 Alzheimer's disease with late onset: Secondary | ICD-10-CM

## 2019-05-22 DIAGNOSIS — J432 Centrilobular emphysema: Secondary | ICD-10-CM | POA: Diagnosis not present

## 2019-05-22 DIAGNOSIS — F419 Anxiety disorder, unspecified: Secondary | ICD-10-CM | POA: Diagnosis not present

## 2019-05-22 DIAGNOSIS — I739 Peripheral vascular disease, unspecified: Secondary | ICD-10-CM | POA: Diagnosis not present

## 2019-05-22 DIAGNOSIS — C162 Malignant neoplasm of body of stomach: Secondary | ICD-10-CM

## 2019-05-22 DIAGNOSIS — F3342 Major depressive disorder, recurrent, in full remission: Secondary | ICD-10-CM

## 2019-05-22 DIAGNOSIS — Z89612 Acquired absence of left leg above knee: Secondary | ICD-10-CM | POA: Diagnosis not present

## 2019-05-22 DIAGNOSIS — E66811 Obesity, class 1: Secondary | ICD-10-CM

## 2019-05-22 DIAGNOSIS — L89301 Pressure ulcer of unspecified buttock, stage 1: Secondary | ICD-10-CM

## 2019-05-22 DIAGNOSIS — Z993 Dependence on wheelchair: Secondary | ICD-10-CM

## 2019-05-22 DIAGNOSIS — F02818 Dementia in other diseases classified elsewhere, unspecified severity, with other behavioral disturbance: Secondary | ICD-10-CM

## 2019-05-22 DIAGNOSIS — E782 Mixed hyperlipidemia: Secondary | ICD-10-CM | POA: Diagnosis not present

## 2019-05-22 DIAGNOSIS — N2581 Secondary hyperparathyroidism of renal origin: Secondary | ICD-10-CM

## 2019-05-22 DIAGNOSIS — G546 Phantom limb syndrome with pain: Secondary | ICD-10-CM

## 2019-05-22 DIAGNOSIS — E669 Obesity, unspecified: Secondary | ICD-10-CM

## 2019-05-22 DIAGNOSIS — E1122 Type 2 diabetes mellitus with diabetic chronic kidney disease: Secondary | ICD-10-CM

## 2019-05-22 DIAGNOSIS — I700 Atherosclerosis of aorta: Secondary | ICD-10-CM

## 2019-05-22 DIAGNOSIS — N184 Chronic kidney disease, stage 4 (severe): Secondary | ICD-10-CM

## 2019-05-22 DIAGNOSIS — F0281 Dementia in other diseases classified elsewhere with behavioral disturbance: Secondary | ICD-10-CM

## 2019-05-22 NOTE — Telephone Encounter (Signed)
1. Pt's son Marden Noble calling today because pt's O2 has been denied since March 2020.  He states he spoke to [place] claims they have not received the medical necessity form, retro authorazation form, that the pt needs to be on oxygen. They informed him this form needs to come from the pcp.  Pt received the O2 after a hospital stay.   The bill is coming from  [place]  Can you help?  2. Also he is looking for the medical power of attorney for the pt. I did not see in the chart.  He wants to know if you have?

## 2019-05-22 NOTE — Progress Notes (Signed)
Name: Amy [name]   MRN: [id number]    DOB: June 10, 1935   Date:05/22/2019       Progress Note  Subjective  Chief Complaint  Chief Complaint  Patient presents with  . Anxiety  . Hyperlipidemia  . Depression  . Diabetes  . Gastroesophageal Reflux  . Bed Sores    Continues to have problems with bedsores. She refuses to lay on her side or sit differently.    I connected with  Chales Abrahams on 05/22/19 at 11:00 AM EST by telephone and verified that I am speaking with the correct person using two identifiers.  I discussed the limitations, risks, security and privacy concerns of performing an evaluation and management service by telephone and the availability of in person appointments. Staff also discussed with the patient that there may be a patient responsible charge related to this service. Patient Location: Home Provider Location: Office Additional Individuals present: Husband  HPI  Emphysema: On 2L [place] continuous, no abnormal shortness of breath; being treated for PNA as above.  Was seeing Dr. Ashby Dawes for care, however he has left his practice - has follow up with a colleague at the same office.  Has nebulizer for duonebs and this works well for her. Husband notes there has been a supply issue with the [place] where the bill has not been covered by insurance - we will connect with CCM social worker to clarify supply costs and insurance coverage.  Aortic Atherosclerosis/HLD/PVD: Taking statin therapy; taking plavix daily. Denies chest pain, shortness of breath, no signs or symptoms of bleeding, no complaints of myalgias.  Seeing Dr. Lucky Cowboy for PVD.  Also seeing Dr. Clayborn Bigness with Cardiology - last visit was October.  Anxiety/Depression: She has not been taking buspar, is taking seroquel TID and prozac daily. Patient and husband feel that her anxiety has been well controlled, mood has been fairly stable. Stable at this time.  Phantom Pain of amputated area/LEFT AKA:  wheelchair bound; taking gabapentin, occasionally uses norco for pain.  She sees Dr. Holley Raring for pain management; husband notes that she still has breakthrough pain, but pain is improved overall and she is sleeping a bit better.   Controlled DM/Obesity: She is eating whatever she wants right now; A1C has been well controlled for over a year now.  She is not on medication for DM at this time; on statin; not on ACE due to more advanced CKD.  Due to COVID-19 surge, we will wait until her next follow up to recheck A1C.   CKD Stage 4 w/ Secondary hyperparathyroidism: Last kidney function 03/18/2019 showed stability in stage 4 CKD. Seeing Dr. Abigail Butts - most recent appt October 2020.   Alzheimer's Dementia: Taking Aricept without issues; husband is primary caregiver, though goals of care and additional help has been discussed in recent visits, also has palliative care on her care team.  May consider placement or in home care in the future - does have 1 person come once weekly for about 5 hours for respite care and patient's step son is helpful.  Husband does not want to move forward with SNF placement at this time.   Gastric Cancer: She is seeing Dr. Mike Gip as her medical oncologist, Dr. Baruch Gouty for Radiation oncologist.  Denies N/V, no abdominal pain or heartburn. Completed radiation 04/06/2019, has follow up with Dr. Baruch Gouty in January.    Pressure ulcers: Seeing wound management with Jeri Cos PA-C - last visit was about 1.5 months ago.  Husband has been having  trouble with repositioning the patient every 2 hours as she is not always compliant.  Patient Active Problem List   Diagnosis Date Noted  . Bone metastasis ([place]) 03/13/2019  . Rib lesion 03/08/2019  . Right lower lobe pulmonary nodule 02/08/2019  . 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 Genital labial ulcer 12/06/2018  . B12 deficiency 11/05/2018  . Folate deficiency 11/05/2018  . Gastric cancer ([place])   . Stomach  irritation   . Obesity (BMI 30.0-34.9) 09/11/2018  . Pulmonary nodules 08/27/2018  . Goals of care, counseling/discussion   . Palliative care by specialist   . Hypoxia 07/26/2018  . HCAP (healthcare-associated pneumonia) 07/02/2018  . Hypoxemia   . Renal cell carcinoma ([place])   . Lung nodule 04/23/2018  . Hypertensive renal disease 11/08/2017  . Secondary hyperparathyroidism of renal origin ([place]) 11/08/2017  . QT prolongation 10/27/2017  . Osteopenia 10/05/2017  . Choledocholithiasis 08/13/2017  . Chronic pain syndrome 04/05/2017  . Depression 11/15/2016  . Iron deficiency anemia 09/08/2016  . Anemia 06/29/2016  . Late onset Alzheimer's disease with behavioral disturbance ([place]) 06/14/2016  . S/P AKA (above knee amputation) unilateral, left ([place]) 06/08/2016  . Wheelchair dependent 03/11/2016  . 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 ([place]) 03/13/2015  . S/p nephrectomy 01/29/2015  . Renal neoplasm 01/15/2015  . Abnormal EKG 01/02/2015  . Renal mass 12/05/2014  . GERD (gastroesophageal reflux disease) 11/20/2014  . GAD (generalized anxiety disorder) 11/20/2014  . AB (asthmatic bronchitis) 11/20/2014  . Aortic atherosclerosis ([place]) 11/20/2014  . Cataract 11/20/2014  . Leg pain 11/20/2014  . Continuous opioid dependence ([place]) 11/20/2014  . CKD (chronic kidney disease) stage 4, GFR 15-29 ml/min (HCC) 11/20/2014  . COPD (chronic obstructive pulmonary disease) ([place]) 11/20/2014  . Excessive falling 11/20/2014  . Fatty infiltration of liver 11/20/2014  . Hyperlipidemia 11/20/2014  . Anterior knee pain 11/20/2014  . LBP (low back pain) 11/20/2014  . Malaise and fatigue 11/20/2014  . Abnormal presence of protein in urine 11/20/2014  . PVD (peripheral vascular disease) ([place]) 11/20/2014  . Allergic rhinitis 11/20/2014  . At risk for falling 11/20/2014  . Anxiety 11/05/2014  . Elevated serum creatinine 11/05/2014  . Diabetes mellitus type 2, controlled ([place]) 11/05/2014  .  H/O adenomatous polyp of colon 07/27/2010    Past Surgical History:  Procedure Laterality Date  . ABDOMINAL HYSTERECTOMY  1978  . CARPAL TUNNEL RELEASE Bilateral   . CHOLECYSTECTOMY    . COLONOSCOPY    . COLONOSCOPY WITH ESOPHAGOGASTRODUODENOSCOPY (EGD)    . ERCP N/A 08/19/2017   Procedure: ENDOSCOPIC RETROGRADE CHOLANGIOPANCREATOGRAPHY (ERCP);  Surgeon: Arta Silence, MD;  Location: [facility] ENDOSCOPY;  Service: Endoscopy;  Laterality: N/A;  . ESOPHAGOGASTRODUODENOSCOPY (EGD) WITH PROPOFOL N/A 10/12/2018   Procedure: ESOPHAGOGASTRODUODENOSCOPY (EGD) WITH PROPOFOL;  Surgeon: Virgel Manifold, MD;  Location: [facility] ENDOSCOPY;  Service: Endoscopy;  Laterality: N/A;  . LEG AMPUTATION     above the knee / left   . ROBOT ASSISTED LAPAROSCOPIC NEPHRECTOMY Left 01/15/2015   Procedure: ROBOTIC ASSISTED LAPAROSCOPIC RADICAL NEPHRECTOMY;  Surgeon: Alexis Frock, MD;  Location: [facility] ORS;  Service: Urology;  Laterality: Left;  . stent placement right leg       Family History  Problem Relation Age of Onset  . Cancer Mother        Stomach  . Cancer Father   . Diabetes Brother   . Cancer Brother        oral  . Healthy Son   . Healthy Sister  Social History   Socioeconomic History  . Marital status: Married    Spouse name: [name]  . Number of children: 1  . Years of education: Not on file  . Highest education level: 10th grade  Occupational History  . Occupation: Retired  Tobacco Use  . Smoking status: Former Smoker    Packs/day: 1.50    Years: 51.00    Pack years: 76.50    Types: Cigarettes    Quit date: 05/24/2004    Years since quitting: 15.0  . Smokeless tobacco: Never Used  . Tobacco comment: smoking cessation materials not required  Substance and Sexual Activity  . Alcohol use: Not Currently  . Drug use: No  . Sexual activity: Not Currently  Other Topics Concern  . Not on file  Social History Narrative  . Not on file   Social Determinants of Health   Financial  Resource Strain:   . Difficulty of Paying Living Expenses: Not on file  Food Insecurity:   . Worried About Charity fundraiser in the Last Year: Not on file  . Ran Out of Food in the Last Year: Not on file  Transportation Needs:   . Lack of Transportation (Medical): Not on file  . Lack of Transportation (Non-Medical): Not on file  Physical Activity:   . Days of Exercise per Week: Not on file  . Minutes of Exercise per Session: Not on file  Stress:   . Feeling of Stress : Not on file  Social Connections: Unknown  . Frequency of Communication with Friends and Family: Not on file  . Frequency of Social Gatherings with Friends and Family: Not on file  . Attends Religious Services: Never  . Active Member of Clubs or Organizations: No  . Attends Archivist Meetings: Never  . Marital Status: Not on file  Intimate Partner Violence:   . Fear of Current or Ex-Partner: Not on file  . Emotionally Abused: Not on file  . Physically Abused: Not on file  . Sexually Abused: Not on file     Current Outpatient Medications:  .  QUEtiapine (SEROQUEL) 50 MG tablet, , Disp: , Rfl:  .  acetaminophen (TYLENOL) 325 MG tablet, Take 325 mg by mouth every 6 (six) hours as needed., Disp: , Rfl:  .  albuterol (VENTOLIN HFA) 108 (90 Base) MCG/ACT inhaler, Inhale 1-2 puffs into the lungs every 4 (four) hours as needed. , Disp: , Rfl:  .  atorvastatin (LIPITOR) 20 MG tablet, Take 1 tablet (20 mg total) by mouth daily at 6 PM., Disp: 90 tablet, Rfl: 3 .  busPIRone (BUSPAR) 10 MG tablet, Take 10 mg by mouth 3 (three) times daily., Disp: , Rfl:  .  clopidogrel (PLAVIX) 75 MG tablet, TAKE 1 TABLET EVERY DAY (Patient taking differently: Take 75 mg by mouth daily. ), Disp: 90 tablet, Rfl: 3 .  donepezil (ARICEPT) 10 MG tablet, Take 1 tablet (10 mg total) by mouth at bedtime., Disp: 90 tablet, Rfl: 3 .  famotidine (PEPCID) 20 MG tablet, Take 1 tablet (20 mg total) by mouth daily., Disp: 90 tablet, Rfl: 3 .   ferrous sulfate 325 (65 FE) MG tablet, Take 325 mg by mouth daily with breakfast. Pt taking every other day, Disp: , Rfl:  .  FLUoxetine (PROZAC) 10 MG capsule, Take 1 capsule (10 mg total) by mouth daily., Disp: 30 capsule, Rfl: 0 .  gabapentin (NEURONTIN) 300 MG capsule, Take 1 capsule (300 mg total) by mouth 3 (three) times  daily., Disp: 270 capsule, Rfl: 3 .  [START ON 06/04/2019] HYDROcodone-acetaminophen (NORCO) 7.5-325 MG tablet, Take 1 tablet by mouth every 8 (eight) hours as needed. For chronic pain.  Okay to fill 1 day early if pharmacy closed on fill date., Disp: 90 tablet, Rfl: 0 .  [START ON 07/04/2019] HYDROcodone-acetaminophen (NORCO) 7.5-325 MG tablet, Take 1 tablet by mouth every 8 (eight) hours as needed. For chronic pain.  Okay to fill 1 day early pharmacy closed on fill date, Disp: 90 tablet, Rfl: 0 .  ipratropium-albuterol (DUONEB) 0.5-2.5 (3) MG/3ML SOLN, INHALE THE CONTENTS OF 1 VIAL VIA NEBULIZER THREE TIMES DAILY AS DIRECTED, Disp: 810 mL, Rfl: 2 .  Melatonin 10 MG CAPS, Take 10 mg by mouth at bedtime as needed (sleep)., Disp: 30 capsule, Rfl: 2 .  pantoprazole (PROTONIX) 40 MG tablet, Take 1 tablet (40 mg total) by mouth daily., Disp: 90 tablet, Rfl: 1 .  QUEtiapine (SEROQUEL) 25 MG tablet, TAKE 1 TABLET EVERY MORNING, 1 TABLET EVERY AFTERNOON, AND 2 TABLETS BEFORE BEDTIME., Disp: 300 tablet, Rfl: 0 .  Thiamine HCl (VITAMIN B-1) 250 MG tablet, Take 250 mg by mouth daily., Disp: , Rfl:   Allergies  Allergen Reactions  . Fentanyl Other (See Comments)    urine retention  . Penicillins Itching, Rash and Other (See Comments)    Has patient had a PCN reaction causing immediate rash, facial/tongue/throat swelling, SOB or lightheadedness with hypotension: Yes Has patient had a PCN reaction causing severe rash involving mucus membranes or skin necrosis: No Has patient had a PCN reaction that required hospitalization: No Has patient had a PCN reaction occurring within the last 10  years: Yes If all of the above answers are "NO", then may proceed with Cephalosporin use.    I personally reviewed active problem list, medication list, allergies, notes from last encounter, lab results with the patient/caregiver today.   ROS  Constitutional: Negative for fever or weight change.  Respiratory: Negative for cough; + shortness of breath with exertion - using 2L O2 continuous.   Cardiovascular: Negative for chest pain or palpitations.  Gastrointestinal: Negative for abdominal pain, no bowel changes.  Musculoskeletal: Negative for gait problem or joint swelling.  Skin: Negative for rash.  Neurological: Negative for dizziness or headache.  No other specific complaints in a complete review of systems (except as listed in HPI above).  Objective  Virtual encounter, vitals not obtained.  There is no height or weight on file to calculate BMI.  Physical Exam  Pulmonary/Chest: Effort normal. No respiratory distress. Speaking in complete sentences Neurological: Pt is alert and disoriented to time and situation; oriented to person, place, and situation. Coordination, speech and gait are normal.  Psychiatric: Patient has a normal mood and affect. behavior is normal. Judgment and thought content normal.  No results found for this or any previous visit (from the past 72 hour(s)).  PHQ2/9: Depression screen [facility] 2/9 03/26/2019 01/19/2019 01/05/2019 12/22/2018 11/28/2018  Decreased Interest 1 0 1 0 0  Down, Depressed, Hopeless 1 0 1 1 0  PHQ - 2 Score 2 0 2 1 0  Altered sleeping 1 0 1 0 0  Tired, decreased energy 1 0 0 0 0  Change in appetite 1 0 1 0 0  Feeling bad or failure about yourself  1 0 0 0 0  Trouble concentrating 1 0 1 0 0  Moving slowly or fidgety/restless 1 0 1 0 0  Suicidal thoughts 0 0 0 0 0  PHQ-9 Score  8 0 6 1 0  Difficult doing work/chores Somewhat difficult Not difficult at all Not difficult at all Not difficult at all Not difficult at all  Some recent data  might be hidden   PHQ-2/9 Result is negative.    Fall Risk: Fall Risk  05/22/2019 03/26/2019 02/20/2019 01/19/2019 01/05/2019  Falls in the past year? 0 0 1 1 1   Comment - - - - -  Number falls in past yr: 0 0 1 1 1   Injury with Fall? 0 0 1 1 1   Comment - - feel out of wc and hit head, stiches - -  Risk Factor Category  - - - - -  Risk for fall due to : - - History of fall(s) History of fall(s);Impaired balance/gait;Impaired mobility;Impaired vision History of fall(s);Impaired balance/gait;Impaired mobility  Follow up - Falls evaluation completed Falls evaluation completed - Falls prevention discussed    Assessment & Plan  1. Centrilobular emphysema ([place]) - Following with pulmonology, doing well at this time - Ambulatory referral to Chronic Care Management Services  2. Pressure injury of buttock, stage 1, unspecified laterality - Seeing wound management for care - encouraged husband to schedule follow up. Telephone only visit today, UTA skin status.  3. S/P AKA (above knee amputation) unilateral, left (HCC) - No falls, doing well overall, seeing Pain management for phantom pain  4. Wheelchair dependent - No falls, stable  5. Aortic atherosclerosis (HCC) - Taking statin and plavix  6. Mixed hyperlipidemia - Taking statin  7. PVD (peripheral vascular disease) (HCC) - Taking statin and plavix  8. Recurrent major depressive disorder, in full remission ([place]) - Mood has been fairly stable, still has some lability when she becomes confused at times.  Doing well on current regimen  9. Anxiety - Doing well on current regimen.  10. 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 (HCC) - Seeing pain management  11. Controlled type 2 diabetes mellitus with stage 4 chronic kidney disease, without long-term current use of insulin ([place]) - Due to COVID-19 surge, we will wait until her next follow up to recheck A1C.   12. Obesity (BMI 30.0-34.9) - Discussed healthy meal options,  ensuring enough protein in the diet, trying to do chair exercises when able.  13. Secondary hyperparathyroidism of renal origin ([place]) - Seeing endocrinology  14. CKD (chronic kidney disease) stage 4, GFR 15-29 ml/min (HCC) - Seeing nephrology  15. Late onset Alzheimer's disease with behavioral disturbance ([place]) - Ambulatory referral to Chronic Care Management Services  16. Malignant neoplasm of body of stomach (HCC) - Seeing Dr. Mike Gip for care.  I discussed the assessment and treatment plan with the patient. The patient was provided an opportunity to ask questions and all were answered. The patient agreed with the plan and demonstrated an understanding of the instructions.   The patient was advised to call back or seek an in-person evaluation if the symptoms worsen or if the condition fails to improve as anticipated.  I provided 32 minutes of non-face-to-face time during this encounter.  [name], FNP

## 2019-05-23 NOTE — Telephone Encounter (Signed)
Per Louis Meckel can help with POA. The son I will call and have him call about the bill

## 2019-05-23 NOTE — Telephone Encounter (Signed)
Amy [name] - please check on Medical POA with [place].  I have referred them to CCM yesterday during our visit to talk with [facility] about the O2 bill.  She should be in touch in the next week or so.

## 2019-05-23 NOTE — Telephone Encounter (Signed)
Please advise 

## 2019-05-24 ENCOUNTER — Ambulatory Visit: Payer: Self-pay | Admitting: *Deleted

## 2019-05-24 DIAGNOSIS — N184 Chronic kidney disease, stage 4 (severe): Secondary | ICD-10-CM

## 2019-05-24 DIAGNOSIS — G301 Alzheimer's disease with late onset: Secondary | ICD-10-CM

## 2019-05-24 DIAGNOSIS — F0281 Dementia in other diseases classified elsewhere with behavioral disturbance: Secondary | ICD-10-CM

## 2019-05-24 NOTE — Patient Instructions (Signed)
Thank you allowing the Chronic Care Management Team to be a part of your care! It was a pleasure speaking with you today!  1. Please continue to work with your son and your insurance company regarding medical bill concerns  CCM (Chronic Care Management) Team   Neldon Labella RN, BSN Nurse Care Coordinator  [phone]  Ruben Reason PharmD  Clinical Pharmacist  [phone]   Elliot Gurney, LCSW Clinical Social Worker [phone]  Goals Addressed            This Visit's Progress   . "I don't understandthese medical bills"       Current Barriers:  . Literacy concerns related to unexplained medical bills for DME  Clinical Social Work Clinical Goal(s):  [name] Over the next 90 days, patient will work with her insurance company to address needs related to unexplained medical bills  Interventions: . Advised patient to continue to work with his insurance company for explanation of bills acquired . Assisted patient/caregiver with obtaining information about health plan benefits  Patient Self Care Activities:  . Patient's spouse  verbalizes understanding of plan  . Attends all scheduled provider appointments . Unable to perform ADLs independently . Unable to perform IADLs independently . Knowledge deficit regarding medical bills and reimbursement responsibilities  Initial goal documentation         The patient verbalized understanding of instructions provided today and declined a print copy of patient instruction materials.   The care management team will reach out to the patient again over the next 7-14 business  days.

## 2019-05-24 NOTE — Chronic Care Management (AMB) (Signed)
Chronic Care Management    Clinical Social Work Follow Up Note  05/24/2019 Name: DONNAMARIE SHANKLES MRN: [id number] DOB: Mar 23, 1936  OCEANE FOSSE is a 83 y.o. year old female who is a primary care patient of [name], [place]. The CCM team was consulted for assistance with Financial Difficulties related to unexplained medical bills.   Review of patient status, including review of consultants reports, other relevant assessments, and collaboration with appropriate care team members and the patient's provider was performed as part of comprehensive patient evaluation and provision of chronic care management services.    Advanced Directives Status: <no information> See Care Plan for related entries.   Outpatient Encounter Medications as of 05/24/2019  Medication Sig  . acetaminophen (TYLENOL) 325 MG tablet Take 325 mg by mouth every 6 (six) hours as needed.  [name] albuterol (VENTOLIN HFA) 108 (90 Base) MCG/ACT inhaler Inhale 1-2 puffs into the lungs every 4 (four) hours as needed.   [name] atorvastatin (LIPITOR) 20 MG tablet Take 1 tablet (20 mg total) by mouth daily at 6 PM.  . busPIRone (BUSPAR) 10 MG tablet Take 10 mg by mouth 3 (three) times daily.  . clopidogrel (PLAVIX) 75 MG tablet TAKE 1 TABLET EVERY DAY (Patient taking differently: Take 75 mg by mouth daily. )  . donepezil (ARICEPT) 10 MG tablet Take 1 tablet (10 mg total) by mouth at bedtime.  . famotidine (PEPCID) 20 MG tablet Take 1 tablet (20 mg total) by mouth daily.  . ferrous sulfate 325 (65 FE) MG tablet Take 325 mg by mouth daily with breakfast. Pt taking every other day  . FLUoxetine (PROZAC) 10 MG capsule Take 1 capsule (10 mg total) by mouth daily.  [name] gabapentin (NEURONTIN) 300 MG capsule Take 1 capsule (300 mg total) by mouth 3 (three) times daily.  [name] ON 06/04/2019] HYDROcodone-acetaminophen (NORCO) 7.5-325 MG tablet Take 1 tablet by mouth every 8 (eight) hours as needed. For chronic pain.  Okay to fill 1 day early if  pharmacy closed on fill date.  [name] ON 07/04/2019] HYDROcodone-acetaminophen (NORCO) 7.5-325 MG tablet Take 1 tablet by mouth every 8 (eight) hours as needed. For chronic pain.  Okay to fill 1 day early pharmacy closed on fill date  . ipratropium-albuterol (DUONEB) 0.5-2.5 (3) MG/3ML SOLN INHALE THE CONTENTS OF 1 VIAL VIA NEBULIZER THREE TIMES DAILY AS DIRECTED  . Melatonin 10 MG CAPS Take 10 mg by mouth at bedtime as needed (sleep).  . pantoprazole (PROTONIX) 40 MG tablet Take 1 tablet (40 mg total) by mouth daily.  . QUEtiapine (SEROQUEL) 25 MG tablet TAKE 1 TABLET EVERY MORNING, 1 TABLET EVERY AFTERNOON, AND 2 TABLETS BEFORE BEDTIME.  [name] Thiamine HCl (VITAMIN B-1) 250 MG tablet Take 250 mg by mouth daily.   No facility-administered encounter medications on file as of 05/24/2019.     Goals Addressed            This Visit's Progress   . "I don't understandthese medical bills"       Current Barriers:  . Literacy concerns related to unexplained medical bills for DME  Clinical Social Work Clinical Goal(s):  [name] Over the next 90 days, patient's spouse will work with [employer] company to address needs related to unexplained medical bills  Interventions:  . Discussed confusion regarding medical bills patient received related to DME . Advised patient's spouse to continue to work with [employer] for explanation of bills acquired   Patient Self Care Activities:  .  Patient's spouse  verbalizes understanding of plan  . Attends all scheduled provider appointments . Unable to perform ADLs independently . Unable to perform IADLs independently . Knowledge deficit regarding medical bills and reimbursement responsibilities  Initial goal documentation         Follow Up Plan: SW will follow up with patient by phone over the next 2 weeks   [place], [place] Worker  [facility] Care Management [phone]

## 2019-05-28 DIAGNOSIS — F5104 Psychophysiologic insomnia: Secondary | ICD-10-CM | POA: Diagnosis not present

## 2019-05-28 DIAGNOSIS — G309 Alzheimer's disease, unspecified: Secondary | ICD-10-CM | POA: Diagnosis not present

## 2019-05-28 DIAGNOSIS — G546 Phantom limb syndrome with pain: Secondary | ICD-10-CM | POA: Diagnosis not present

## 2019-05-28 DIAGNOSIS — G4719 Other hypersomnia: Secondary | ICD-10-CM | POA: Diagnosis not present

## 2019-05-28 DIAGNOSIS — F411 Generalized anxiety disorder: Secondary | ICD-10-CM | POA: Diagnosis not present

## 2019-05-28 DIAGNOSIS — F418 Other specified anxiety disorders: Secondary | ICD-10-CM | POA: Diagnosis not present

## 2019-05-28 DIAGNOSIS — F0151 Vascular dementia with behavioral disturbance: Secondary | ICD-10-CM | POA: Diagnosis not present

## 2019-05-30 ENCOUNTER — Telehealth: Payer: Self-pay | Admitting: Family Medicine

## 2019-05-30 DIAGNOSIS — F0281 Dementia in other diseases classified elsewhere with behavioral disturbance: Secondary | ICD-10-CM

## 2019-05-30 DIAGNOSIS — F02818 Dementia in other diseases classified elsewhere, unspecified severity, with other behavioral disturbance: Secondary | ICD-10-CM

## 2019-05-30 NOTE — Telephone Encounter (Signed)
Can he be worked in as Hospital doctor appointment for this or do you just want to call in Education officer, museum now

## 2019-05-30 NOTE — Telephone Encounter (Signed)
Pt husband is calling and he can no longer take care of his wife and would like emily to call him to discuss getting long term care for his wife

## 2019-05-30 NOTE — Telephone Encounter (Signed)
No need for appointment, we discussed at recent visit and he wanted time to think about it. Referral to [facility] LCSW is placed.

## 2019-05-30 NOTE — Telephone Encounter (Signed)
Amy [name] notified someone will reach to him

## 2019-05-31 ENCOUNTER — Ambulatory Visit (INDEPENDENT_AMBULATORY_CARE_PROVIDER_SITE_OTHER): Payer: Medicare HMO | Admitting: *Deleted

## 2019-05-31 DIAGNOSIS — N184 Chronic kidney disease, stage 4 (severe): Secondary | ICD-10-CM

## 2019-05-31 DIAGNOSIS — F02818 Dementia in other diseases classified elsewhere, unspecified severity, with other behavioral disturbance: Secondary | ICD-10-CM

## 2019-05-31 DIAGNOSIS — F0281 Dementia in other diseases classified elsewhere with behavioral disturbance: Secondary | ICD-10-CM

## 2019-06-01 ENCOUNTER — Ambulatory Visit: Payer: Self-pay | Admitting: *Deleted

## 2019-06-01 DIAGNOSIS — N184 Chronic kidney disease, stage 4 (severe): Secondary | ICD-10-CM

## 2019-06-01 DIAGNOSIS — F02818 Dementia in other diseases classified elsewhere, unspecified severity, with other behavioral disturbance: Secondary | ICD-10-CM

## 2019-06-01 DIAGNOSIS — F0281 Dementia in other diseases classified elsewhere with behavioral disturbance: Secondary | ICD-10-CM

## 2019-06-01 NOTE — Chronic Care Management (AMB) (Signed)
Chronic Care Management    Clinical Social Work Follow Up Note  06/01/2019 Name: ELLENORA TALTON MRN: [id number] DOB: January 26, 1936  ROSALYND MCWRIGHT is a 84 y.o. year old female who is a primary care patient of [name], [place]. The CCM team was consulted for assistance with Financial Difficulties related to unexplained medical bills as well as level of care concerns.  Review of patient status, including review of consultants reports, other relevant assessments, and collaboration with appropriate care team members and the patient's provider was performed as part of comprehensive patient evaluation and provision of chronic care management services.    Advanced Directives Status: <no information> See Care Plan for related entries.   Outpatient Encounter Medications as of 05/31/2019  Medication Sig  . acetaminophen (TYLENOL) 325 MG tablet Take 325 mg by mouth every 6 (six) hours as needed.  [name] albuterol (VENTOLIN HFA) 108 (90 Base) MCG/ACT inhaler Inhale 1-2 puffs into the lungs every 4 (four) hours as needed.   [name] atorvastatin (LIPITOR) 20 MG tablet Take 1 tablet (20 mg total) by mouth daily at 6 PM.  . busPIRone (BUSPAR) 10 MG tablet Take 10 mg by mouth 3 (three) times daily.  . clopidogrel (PLAVIX) 75 MG tablet TAKE 1 TABLET EVERY DAY (Patient taking differently: Take 75 mg by mouth daily. )  . donepezil (ARICEPT) 10 MG tablet Take 1 tablet (10 mg total) by mouth at bedtime.  . famotidine (PEPCID) 20 MG tablet Take 1 tablet (20 mg total) by mouth daily.  . ferrous sulfate 325 (65 FE) MG tablet Take 325 mg by mouth daily with breakfast. Pt taking every other day  . FLUoxetine (PROZAC) 10 MG capsule Take 1 capsule (10 mg total) by mouth daily.  [name] gabapentin (NEURONTIN) 300 MG capsule Take 1 capsule (300 mg total) by mouth 3 (three) times daily.  [name] ON 06/04/2019] HYDROcodone-acetaminophen (NORCO) 7.5-325 MG tablet Take 1 tablet by mouth every 8 (eight) hours as needed. For chronic pain.   Okay to fill 1 day early if pharmacy closed on fill date.  [name] ON 07/04/2019] HYDROcodone-acetaminophen (NORCO) 7.5-325 MG tablet Take 1 tablet by mouth every 8 (eight) hours as needed. For chronic pain.  Okay to fill 1 day early pharmacy closed on fill date  . ipratropium-albuterol (DUONEB) 0.5-2.5 (3) MG/3ML SOLN INHALE THE CONTENTS OF 1 VIAL VIA NEBULIZER THREE TIMES DAILY AS DIRECTED  . pantoprazole (PROTONIX) 40 MG tablet Take 1 tablet (40 mg total) by mouth daily.  . QUEtiapine (SEROQUEL) 25 MG tablet TAKE 1 TABLET EVERY MORNING, 1 TABLET EVERY AFTERNOON, AND 2 TABLETS BEFORE BEDTIME.  [name] Thiamine HCl (VITAMIN B-1) 250 MG tablet Take 250 mg by mouth daily.   No facility-administered encounter medications on file as of 05/31/2019.     Goals Addressed            This Visit's Progress   . "I don't understandthese medical bills"       Current Barriers:  . Literacy concerns related to unexplained medical bills for DME  Clinical Social Work Clinical Goal(s):  [name] Over the next 90 days, patient's spouse will work with [employer] company to address needs related to unexplained medical bills  Interventions:  . Discussed confusion with patient's spouse regarding medical bills patient received related to DME . Advised patient's spouse to continue to work with [employer] for explanation of bills acquired . Discussed caregiver concern that he is having difficult providing care for patient  stating that he needs more help in the home . Patient's spouse confirms that he has an aid that comes in twice a week but feels he needs more . Encouraged patient's spouse to consider contacting additional in home aid agencies to discuss additional hours . Discussed placement options as well as patient's needs increase   Patient Self Care Activities:  . Patient's spouse  verbalizes understanding of plan  . Attends all scheduled provider appointments . Unable to perform ADLs  independently . Unable to perform IADLs independently . Knowledge deficit regarding medical bills and reimbursement responsibilities  Please see past updates related to this goal by clicking on the "Past Updates" button in the selected goal          Follow Up Plan: SW will follow up with patient's spouse by phone over the next 2 weeks   [place], [place] Worker  [facility] Care Management [phone]

## 2019-06-01 NOTE — Patient Instructions (Signed)
Thank you allowing the Chronic Care Management Team to be a part of your care! It was a pleasure speaking with you today!  1. Please call this social worker with any questions or concerns regarding patient's in home needs.  CCM (Chronic Care Management) Team   Neldon Labella RN, BSN Nurse Care Coordinator  [phone]  Ruben Reason PharmD  Clinical Pharmacist  [phone]   Elliot Gurney, LCSW Clinical Social Worker [phone]  Goals Addressed            This Visit's Progress   . "I don't understandthese medical bills"       Current Barriers:  . Literacy concerns related to unexplained medical bills for DME  Clinical Social Work Clinical Goal(s):  [name] Over the next 90 days, patient's spouse will work with [employer] company to address needs related to unexplained medical bills  Interventions:  . Discussed confusion with patient's spouse regarding medical bills patient received related to DME . Advised patient's spouse to continue to work with [employer] for explanation of bills acquired . Discussed caregiver concern that he is having difficult providing care for patient stating that he needs more help in the home . Patient's spouse confirms that he has an aid that comes in twice a week but feels he needs more . Encouraged patient's spouse to consider contacting additional in home aid agencies to discuss additional hours . Discussed placement options as well as patient's needs increase   Patient Self Care Activities:  . Patient's spouse  verbalizes understanding of plan  . Attends all scheduled provider appointments . Unable to perform ADLs independently . Unable to perform IADLs independently . Knowledge deficit regarding medical bills and reimbursement responsibilities  Please see past updates related to this goal by clicking on the "Past Updates" button in the selected goal          The patient verbalized understanding of  instructions provided today and declined a print copy of patient instruction materials.   The care management team will reach out to the patient again over the next 14 days.

## 2019-06-01 NOTE — Chronic Care Management (AMB) (Signed)
Chronic Care Management    Clinical Social Work Follow Up Note  06/01/2019 Name: BREALYNN CONTINO MRN: [id number] DOB: 09-02-1935  CHESSIE NEUHARTH is a 84 y.o. year old female who is a primary care patient of [name], [place]. The CCM team was consulted for assistance with [name] .   Review of patient status, including review of consultants reports, other relevant assessments, and collaboration with appropriate care team members and the patient's provider was performed as part of comprehensive patient evaluation and provision of chronic care management services.     Advanced Directives Status: <no information> See Care Plan for related entries.   Outpatient Encounter Medications as of 06/01/2019  Medication Sig  . acetaminophen (TYLENOL) 325 MG tablet Take 325 mg by mouth every 6 (six) hours as needed.  [name] albuterol (VENTOLIN HFA) 108 (90 Base) MCG/ACT inhaler Inhale 1-2 puffs into the lungs every 4 (four) hours as needed.   [name] atorvastatin (LIPITOR) 20 MG tablet Take 1 tablet (20 mg total) by mouth daily at 6 PM.  . busPIRone (BUSPAR) 10 MG tablet Take 10 mg by mouth 3 (three) times daily.  . clopidogrel (PLAVIX) 75 MG tablet TAKE 1 TABLET EVERY DAY (Patient taking differently: Take 75 mg by mouth daily. )  . donepezil (ARICEPT) 10 MG tablet Take 1 tablet (10 mg total) by mouth at bedtime.  . famotidine (PEPCID) 20 MG tablet Take 1 tablet (20 mg total) by mouth daily.  . ferrous sulfate 325 (65 FE) MG tablet Take 325 mg by mouth daily with breakfast. Pt taking every other day  . FLUoxetine (PROZAC) 10 MG capsule Take 1 capsule (10 mg total) by mouth daily.  [name] gabapentin (NEURONTIN) 300 MG capsule Take 1 capsule (300 mg total) by mouth 3 (three) times daily.  [name] ON 06/04/2019] HYDROcodone-acetaminophen (NORCO) 7.5-325 MG tablet Take 1 tablet by mouth every 8 (eight) hours as needed. For chronic pain.  Okay to fill 1 day early if pharmacy closed on fill date.  [name]  ON 07/04/2019] HYDROcodone-acetaminophen (NORCO) 7.5-325 MG tablet Take 1 tablet by mouth every 8 (eight) hours as needed. For chronic pain.  Okay to fill 1 day early pharmacy closed on fill date  . ipratropium-albuterol (DUONEB) 0.5-2.5 (3) MG/3ML SOLN INHALE THE CONTENTS OF 1 VIAL VIA NEBULIZER THREE TIMES DAILY AS DIRECTED  . pantoprazole (PROTONIX) 40 MG tablet Take 1 tablet (40 mg total) by mouth daily.  . QUEtiapine (SEROQUEL) 25 MG tablet TAKE 1 TABLET EVERY MORNING, 1 TABLET EVERY AFTERNOON, AND 2 TABLETS BEFORE BEDTIME.  [name] Thiamine HCl (VITAMIN B-1) 250 MG tablet Take 250 mg by mouth daily.   No facility-administered encounter medications on file as of 06/01/2019.     Goals Addressed            This Visit's Progress   . "I don't understandthese medical bills"       Current Barriers:  . Literacy concerns related to unexplained medical bills for DME  Clinical Social Work Clinical Goal(s):  [name] Over the next 90 days, patient's spouse will work with [employer] company to address needs related to unexplained medical bills  Interventions:  . Continued to discussed  concern that he is having difficult providing care for patient stating that he needs more help in the home . Provided contact number to [facility] that was requested previously [phone] . Encouraged patient's spouse contact preferred  in home aid agency to discuss additional hours   Patient  Self Care Activities:  . Patient's spouse  verbalizes understanding of plan  . Attends all scheduled provider appointments . Unable to perform ADLs independently . Unable to perform IADLs independently . Knowledge deficit regarding medical bills and reimbursement responsibilities  Please see past updates related to this goal by clicking on the "Past Updates" button in the selected goal          Follow Up Plan: SW will follow up with patient by phone over the next 2 weeks   [place], Cyril  Worker  [facility] Care Management [phone]

## 2019-06-01 NOTE — Patient Instructions (Addendum)
Thank you allowing the Chronic Care Management Team to be a part of your care! It was a pleasure speaking with you today!  1. Please call this social worker with any questions or concerns regarding patient's community resource needs  CCM (Chronic Care Management) Team   Neldon Labella  RN, BSN Nurse Care Coordinator  [phone]  Ruben Reason PharmD  Clinical Pharmacist  [phone]   [place], LCSW Clinical Social Worker [phone]  Goals Addressed            This Visit's Progress   . "I don't understandthese medical bills"       Current Barriers:  . Literacy concerns related to unexplained medical bills for DME  Clinical Social Work Clinical Goal(s):  [name] Over the next 90 days, patient's spouse will work with [employer] company to address needs related to unexplained medical bills  Interventions:  . Continued to discussed  concern that he is having difficult providing care for patient stating that he needs more help in the home . Provided contact number to [facility] that was requested previously [phone] . Encouraged patient's spouse contact preferred  in home aid agency to discuss additional hours   Patient Self Care Activities:  . Patient's spouse  verbalizes understanding of plan  . Attends all scheduled provider appointments . Unable to perform ADLs independently . Unable to perform IADLs independently . Knowledge deficit regarding medical bills and reimbursement responsibilities  Please see past updates related to this goal by clicking on the "Past Updates" button in the selected goal          The patient verbalized understanding of instructions provided today and declined a print copy of patient instruction materials.   Telephone follow up appointment with care management team member scheduled for: 06/05/19

## 2019-06-02 DIAGNOSIS — J449 Chronic obstructive pulmonary disease, unspecified: Secondary | ICD-10-CM | POA: Diagnosis not present

## 2019-06-03 DIAGNOSIS — J9601 Acute respiratory failure with hypoxia: Secondary | ICD-10-CM | POA: Diagnosis not present

## 2019-06-04 DIAGNOSIS — S31125A Laceration of abdominal wall with foreign body, periumbilic region without penetration into peritoneal cavity, initial encounter: Secondary | ICD-10-CM | POA: Diagnosis not present

## 2019-06-04 DIAGNOSIS — Z89612 Acquired absence of left leg above knee: Secondary | ICD-10-CM | POA: Diagnosis not present

## 2019-06-04 DIAGNOSIS — J42 Unspecified chronic bronchitis: Secondary | ICD-10-CM | POA: Diagnosis not present

## 2019-06-05 ENCOUNTER — Ambulatory Visit: Payer: Self-pay | Admitting: *Deleted

## 2019-06-05 DIAGNOSIS — N184 Chronic kidney disease, stage 4 (severe): Secondary | ICD-10-CM

## 2019-06-05 DIAGNOSIS — F02818 Dementia in other diseases classified elsewhere, unspecified severity, with other behavioral disturbance: Secondary | ICD-10-CM

## 2019-06-05 DIAGNOSIS — F0281 Dementia in other diseases classified elsewhere with behavioral disturbance: Secondary | ICD-10-CM

## 2019-06-05 NOTE — Chronic Care Management (AMB) (Signed)
Chronic Care Management    Clinical Social Work Follow Up Note  06/05/2019 Name: VALYNN SCHAMBERGER MRN: [id number] DOB: [birth year]  Amy [name] is a 84 y.o. year old female who is a primary care patient of [name], [place]. The CCM team was consulted for assistance with [name] .   Review of patient status, including review of consultants reports, other relevant assessments, and collaboration with appropriate care team members and the patient's provider was performed as part of comprehensive patient evaluation and provision of chronic care management services.    Advanced Directives Status: <no information> See Care Plan for related entries.   Outpatient Encounter Medications as of 06/05/2019  Medication Sig  . acetaminophen (TYLENOL) 325 MG tablet Take 325 mg by mouth every 6 (six) hours as needed.  [name] albuterol (VENTOLIN HFA) 108 (90 Base) MCG/ACT inhaler Inhale 1-2 puffs into the lungs every 4 (four) hours as needed.   [name] atorvastatin (LIPITOR) 20 MG tablet Take 1 tablet (20 mg total) by mouth daily at 6 PM.  . busPIRone (BUSPAR) 10 MG tablet Take 10 mg by mouth 3 (three) times daily.  . clopidogrel (PLAVIX) 75 MG tablet TAKE 1 TABLET EVERY DAY (Patient taking differently: Take 75 mg by mouth daily. )  . donepezil (ARICEPT) 10 MG tablet Take 1 tablet (10 mg total) by mouth at bedtime.  . famotidine (PEPCID) 20 MG tablet Take 1 tablet (20 mg total) by mouth daily.  . ferrous sulfate 325 (65 FE) MG tablet Take 325 mg by mouth daily with breakfast. Pt taking every other day  . FLUoxetine (PROZAC) 10 MG capsule Take 1 capsule (10 mg total) by mouth daily.  [name] gabapentin (NEURONTIN) 300 MG capsule Take 1 capsule (300 mg total) by mouth 3 (three) times daily.  [name] HYDROcodone-acetaminophen (NORCO) 7.5-325 MG tablet Take 1 tablet by mouth every 8 (eight) hours as needed. For chronic pain.  Okay to fill 1 day early if pharmacy closed on fill date.  [name] ON 07/04/2019]  HYDROcodone-acetaminophen (NORCO) 7.5-325 MG tablet Take 1 tablet by mouth every 8 (eight) hours as needed. For chronic pain.  Okay to fill 1 day early pharmacy closed on fill date  . ipratropium-albuterol (DUONEB) 0.5-2.5 (3) MG/3ML SOLN INHALE THE CONTENTS OF 1 VIAL VIA NEBULIZER THREE TIMES DAILY AS DIRECTED  . pantoprazole (PROTONIX) 40 MG tablet Take 1 tablet (40 mg total) by mouth daily.  . QUEtiapine (SEROQUEL) 25 MG tablet TAKE 1 TABLET EVERY MORNING, 1 TABLET EVERY AFTERNOON, AND 2 TABLETS BEFORE BEDTIME.  [name] Thiamine HCl (VITAMIN B-1) 250 MG tablet Take 250 mg by mouth daily.   No facility-administered encounter medications on file as of 06/05/2019.     Goals Addressed            This Visit's Progress   . "I don't understandthese medical bills"       Current Barriers:  . Literacy concerns related to unexplained medical bills for DME  Clinical Social Work Clinical Goal(s):  [name] Over the next 90 days, patient's spouse will work with [employer] company to address needs related to unexplained medical bills  Interventions:  . Continued to discussed  concern that he is having difficult providing care for patient stating that he needs more help in the home . Discussed need to hire additional in home care or begin the consideration of a higher level of care . Confirmed that patient's spouse has been in contact [facility] and they are in  process if completing assessment for additional help . Confirmed that patient's spouse should receive a return call from them today . Continued to encourage patient's spouse to continue to work with son on figuring out medical costs for his insurance   Patient Self Care Activities:  . Patient's spouse  verbalizes understanding of plan  . Attends all scheduled provider appointments . Unable to perform ADLs independently . Unable to perform IADLs independently . Knowledge deficit regarding medical bills and reimbursement  responsibilities  Please see past updates related to this goal by clicking on the "Past Updates" button in the selected goal          Follow Up Plan: SW will follow up with patient's spouse by phone  regarding the start of in home help for patient over the next 2 weeks   Alexica Schlossberg, [place] Worker  [facility] Care Management [phone]

## 2019-06-05 NOTE — Patient Instructions (Signed)
Thank you allowing the Chronic Care Management Team to be a part of your care! It was a pleasure speaking with you today!  1. Please follow up with [facility] to coordinate in home care for patient.  CCM (Chronic Care Management) Team   Neldon Labella RN, BSN Nurse Care Coordinator  [phone]  Ruben Reason PharmD  Clinical Pharmacist  [phone]   Elliot Gurney, LCSW Clinical Social Worker [phone]  Goals Addressed            This Visit's Progress   . "I don't understandthese medical bills"       Current Barriers:  . Literacy concerns related to unexplained medical bills for DME  Clinical Social Work Clinical Goal(s):  [name] Over the next 90 days, patient's spouse will work with [employer] company to address needs related to unexplained medical bills  Interventions:  . Continued to discussed  concern that he is having difficult providing care for patient stating that he needs more help in the home . Discussed need to hire additional in home care or begin the consideration of a higher level of care . Confirmed that patient's spouse has been in contact [facility] and they are in process if completing assessment for additional help . Confirmed that patient's spouse should receive a return call from them today . Continued to encourage patient's spouse to continue to work with son on figuring out medical costs for his insurance   Patient Self Care Activities:  . Patient's spouse  verbalizes understanding of plan  . Attends all scheduled provider appointments . Unable to perform ADLs independently . Unable to perform IADLs independently . Knowledge deficit regarding medical bills and reimbursement responsibilities  Please see past updates related to this goal by clicking on the "Past Updates" button in the selected goal          The patient verbalized understanding of instructions provided today and declined a print copy of patient instruction  materials.   Telephone follow up appointment with care management team member scheduled for: 06/12/19

## 2019-06-06 ENCOUNTER — Ambulatory Visit: Payer: Medicare HMO | Admitting: Radiation Oncology

## 2019-06-06 ENCOUNTER — Ambulatory Visit
Admission: RE | Admit: 2019-06-06 | Discharge: 2019-06-06 | Disposition: A | Discharge status: Home / Self Care | Payer: Medicare HMO | Source: Ambulatory Visit | Attending: Radiation Oncology | Admitting: Radiation Oncology

## 2019-06-06 ENCOUNTER — Other Ambulatory Visit: Payer: Self-pay

## 2019-06-06 DIAGNOSIS — Z923 Personal history of irradiation: Secondary | ICD-10-CM | POA: Diagnosis not present

## 2019-06-06 DIAGNOSIS — C7951 Secondary malignant neoplasm of bone: Secondary | ICD-10-CM | POA: Diagnosis not present

## 2019-06-06 DIAGNOSIS — Z8553 Personal history of malignant neoplasm of renal pelvis: Secondary | ICD-10-CM | POA: Insufficient documentation

## 2019-06-06 DIAGNOSIS — Z510 Encounter for antineoplastic radiation therapy: Secondary | ICD-10-CM | POA: Diagnosis not present

## 2019-06-06 DIAGNOSIS — C162 Malignant neoplasm of body of stomach: Secondary | ICD-10-CM

## 2019-06-06 NOTE — Progress Notes (Signed)
Radiation Oncology Follow up Note  Name: CHALSEA DARKO   Date:   06/06/2019 MRN:  [id number] DOB: July 31, 1935    This 84 y.o. female presents to the clinic today for 1 month follow-up status post palliative radiation therapy to her left ribs for metastatic disease.  REFERRING PROVIDER: [name], FNP  HPI: Patient is an 84 year old female wheelchair-bound who is now 1 month out having received palliative radiation therapy to her left ribs for metastatic involvement. 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 She also stage III renal cell carcinoma status post left radical nephrectomy 2016. She is seen today she is wheelchair-bound she is states the left ribs have completely responded as far as pain is concerned. She is having no dyspepsia no blood per rectum or dysphagia..  COMPLICATIONS OF TREATMENT: none  FOLLOW UP COMPLIANCE: keeps appointments   PHYSICAL EXAM:  There were no vitals taken for this visit. Well-developed wheelchair-bound female in NAD. Deep palpation of her bilateral ribs does not elicit pain. Well-developed well-nourished patient in NAD. HEENT reveals PERLA, EOMI, discs not visualized.  Oral cavity is clear. No oral mucosal lesions are identified. Neck is clear without evidence of cervical or supraclavicular adenopathy. Lungs are clear to A&P. Cardiac examination is essentially unremarkable with regular rate and rhythm without murmur rub or thrill. Abdomen is benign with no organomegaly or masses noted. Motor sensory and DTR levels are equal and symmetric in the upper and lower extremities. Cranial nerves II through XII are grossly intact. Proprioception is intact. No peripheral adenopathy or edema is identified. No motor or sensory levels are noted. Crude visual fields are within normal range.  RADIOLOGY RESULTS: No current films for review  PLAN: Present time will turn follow-up care over to Dr. Mike Gip. I am  making a follow-up appoint with her in [place]. I would be happy to reevaluate the patient anytime the future should further palliative treatment be indicated. Patient and husband both know to call with any concerns.  I would like to take this opportunity to thank you for allowing me to participate in the care of your patient.[name], MD

## 2019-06-12 ENCOUNTER — Ambulatory Visit: Payer: Self-pay | Admitting: *Deleted

## 2019-06-12 ENCOUNTER — Telehealth: Payer: Self-pay | Admitting: *Deleted

## 2019-06-12 DIAGNOSIS — G301 Alzheimer's disease with late onset: Secondary | ICD-10-CM

## 2019-06-12 DIAGNOSIS — F0281 Dementia in other diseases classified elsewhere with behavioral disturbance: Secondary | ICD-10-CM

## 2019-06-12 DIAGNOSIS — F419 Anxiety disorder, unspecified: Secondary | ICD-10-CM

## 2019-06-12 NOTE — Chronic Care Management (AMB) (Signed)
Chronic Care Management    Clinical Social Work Follow Up Note  06/12/2019 Name: Amy [name] MRN: [id number] DOB: 08/27/1935  Amy [name] is a 84 y.o. year old female who is a primary care patient of [name], [place]. The CCM team was consulted for assistance with Financial Difficulties related to unpaid medical bills.   Review of patient status, including review of consultants reports, other relevant assessments, and collaboration with appropriate care team members and the patient's provider was performed as part of comprehensive patient evaluation and provision of chronic care management services.    Advanced Directives Status: <no information> See Care Plan for related entries.   Outpatient Encounter Medications as of 06/12/2019  Medication Sig  . acetaminophen (TYLENOL) 325 MG tablet Take 325 mg by mouth every 6 (six) hours as needed.  [name] albuterol (VENTOLIN HFA) 108 (90 Base) MCG/ACT inhaler Inhale 1-2 puffs into the lungs every 4 (four) hours as needed.   [name] atorvastatin (LIPITOR) 20 MG tablet Take 1 tablet (20 mg total) by mouth daily at 6 PM.  . busPIRone (BUSPAR) 10 MG tablet Take 10 mg by mouth 3 (three) times daily.  . clopidogrel (PLAVIX) 75 MG tablet TAKE 1 TABLET EVERY DAY (Patient taking differently: Take 75 mg by mouth daily. )  . donepezil (ARICEPT) 10 MG tablet Take 1 tablet (10 mg total) by mouth at bedtime.  . famotidine (PEPCID) 20 MG tablet Take 1 tablet (20 mg total) by mouth daily.  . ferrous sulfate 325 (65 FE) MG tablet Take 325 mg by mouth daily with breakfast. Pt taking every other day  . FLUoxetine (PROZAC) 10 MG capsule Take 1 capsule (10 mg total) by mouth daily.  [name] gabapentin (NEURONTIN) 300 MG capsule Take 1 capsule (300 mg total) by mouth 3 (three) times daily.  [name] HYDROcodone-acetaminophen (NORCO) 7.5-325 MG tablet Take 1 tablet by mouth every 8 (eight) hours as needed. For chronic pain.  Okay to fill 1 day early if pharmacy closed on fill  date.  [name] ON 07/04/2019] HYDROcodone-acetaminophen (NORCO) 7.5-325 MG tablet Take 1 tablet by mouth every 8 (eight) hours as needed. For chronic pain.  Okay to fill 1 day early pharmacy closed on fill date  . ipratropium-albuterol (DUONEB) 0.5-2.5 (3) MG/3ML SOLN INHALE THE CONTENTS OF 1 VIAL VIA NEBULIZER THREE TIMES DAILY AS DIRECTED  . pantoprazole (PROTONIX) 40 MG tablet Take 1 tablet (40 mg total) by mouth daily.  . QUEtiapine (SEROQUEL) 25 MG tablet TAKE 1 TABLET EVERY MORNING, 1 TABLET EVERY AFTERNOON, AND 2 TABLETS BEFORE BEDTIME.  [name] Thiamine HCl (VITAMIN B-1) 250 MG tablet Take 250 mg by mouth daily.   No facility-administered encounter medications on file as of 06/12/2019.     Goals Addressed            This Visit's Progress   . "I don't understandthese medical bills"       Current Barriers:  . Literacy concerns related to unexplained medical bills for DME  Clinical Social Work Clinical Goal(s):  [name] Over the next 90 days, patient's spouse will work with [employer] company to address needs related to unexplained medical bills  Interventions:  . Discussed patient's oxygen bill with patient's son reporting need for a prescription  from patient's provider's office which has been previously requested . Discussed plan for patient's spouse to hire additional in home care for patient in addition to  the 3 days a week already provided . Left voicemail with patient's  spouse requesting a return call to follow up on arrangements made for additional in home support . Continued to encourage patient's son to work with the [place] office to obtain  documentation needed to cover oxygen.   Patient Self Care Activities:  . Patient's spouse  verbalizes understanding of plan  . Attends all scheduled provider appointments . Unable to perform ADLs independently . Unable to perform IADLs independently . Knowledge deficit regarding medical bills and reimbursement  responsibilities  Please see past updates related to this goal by clicking on the "Past Updates" button in the selected goal          Follow Up Plan: SW will follow up with patient by phone over the next 7-10 business days   Elliot Gurney, [place] Worker  [facility] Care Management [phone]

## 2019-06-12 NOTE — Patient Instructions (Signed)
Thank you allowing the Chronic Care Management Team to be a part of your care! It was a pleasure speaking with you today!  1. Please contact your providers office regarding need for a new prescription for the oxygen   CCM (Chronic Care Management) Team   Neldon Labella RN, BSN Nurse Care Coordinator  [phone]  Ruben Reason PharmD  Clinical Pharmacist  [phone]   [place], LCSW Clinical Social Worker [phone]  Goals Addressed            This Visit's Progress   . "I don't understandthese medical bills"       Current Barriers:  . Literacy concerns related to unexplained medical bills for DME  Clinical Social Work Clinical Goal(s):  [name] Over the next 90 days, patient's spouse will work with [employer] company to address needs related to unexplained medical bills  Interventions:  . Discussed patient's oxygen bill with patient's son reporting need for a prescription  from patient's provider's office which has been previously requested . Discussed plan for patient's spouse to hire additional in home care for patient in addition to  the 3 days a week already provided . Left voicemail with patient's spouse requesting a return call to follow up on arrangements made for additional in home support . Continued to encourage patient's son to work with the [place] office to obtain  documentation needed to cover oxygen.   Patient Self Care Activities:  . Patient's spouse  verbalizes understanding of plan  . Attends all scheduled provider appointments . Unable to perform ADLs independently . Unable to perform IADLs independently . Knowledge deficit regarding medical bills and reimbursement responsibilities  Please see past updates related to this goal by clicking on the "Past Updates" button in the selected goal          The patient verbalized understanding of instructions provided today and declined a print copy of patient instruction materials.    The care management team will reach out to the patient again over the next 7-10 business days days.

## 2019-06-13 ENCOUNTER — Other Ambulatory Visit: Payer: Self-pay

## 2019-06-13 ENCOUNTER — Other Ambulatory Visit: Payer: Medicare HMO | Admitting: Primary Care

## 2019-06-13 DIAGNOSIS — Z515 Encounter for palliative care: Secondary | ICD-10-CM | POA: Diagnosis not present

## 2019-06-13 NOTE — Progress Notes (Signed)
[facility] Consult Note Telephone: [phone]  Fax: [phone]   PATIENT NAME: Amy [name] [address] [place] [address] [phone] (home)  DOB: 14-Jan-1936 MRN: [id number]  PRIMARY CARE PROVIDER:   [name], [place], [address] [place] [address] [phone]  REFERRING PROVIDER:  [name], [place],  [place] [address] [phone]  RESPONSIBLE PARTY:   Extended Emergency Contact Information Primary Emergency Contact: [facility] Address: [address]          [place], [place] [address] Johnnette Litter of [place] Phone: [phone] Mobile Phone: [phone] Relation: Spouse Secondary Emergency Contact: [place], [place] Phone: [phone] Mobile Phone: [phone] Relation: Son   ASSESSMENT AND RECOMMENDATIONS:   1. Advance Care Planning/Goals of Care: Goals include to maximize quality of life and symptom management. MOST with DNR, limited scope of intervention, use of antibiotics and fluids, no feeding tube.   2. Symptom Management:   Pressure and incontinence injury: Has excoriated skin on left buttock. I am recommending foam, bordered sacrum silicon dressing which I ordered for patient. I educated patient's husband to cleanse area, dry and apply the foam dressing for 3-5 days if possible.   Caregiver strain: Husband has hired more help and seems  To be more rested. He however still outlines many strains, especially related to incontinence.   3. Family /Caregiver/Community Supports: Husband is primary caregiver. They have a paid caregiver 10 hours a week which has helped a lot with caregiver strain.   4. Cognitive / Functional decline: Alert, oriented, at cognitive baseline. Able to stand to pivot with great difficulty. Needs assistance with all iadls and most adls.  5. Follow up Palliative Care Visit: Palliative care will continue to follow for goals  of care clarification and symptom management. Return 2 weeks or prn.  I spent 60 minutes providing this consultation,  from 1000 to 1100. More than 50% of the time in this consultation was spent coordinating communication.   HISTORY OF PRESENT ILLNESS:  Amy [name] is a 84 y.o. year old female with multiple medical problems including dementia, DM, left AKA, pressure/incontinence injury. Palliative Care was asked to follow this patient by consultation request of [name], FNP to help address advance care planning and goals of care. This is a follow up visit.  CODE STATUS: DNR, Limited scope, use of antibiotics and fluids, no feeding tube.    PPS: 30% HOSPICE ELIGIBILITY/DIAGNOSIS: yes if no further cancer treatment desired.  PAST MEDICAL HISTORY:  Past Medical History:  Diagnosis Date  . Anemia    low iron  . Anxiety   . Arthritis   . Bilateral cataracts   . Bronchitis    hx of   . Cataract   . Chronic kidney disease   . Depression   . Diabetes mellitus without complication ([facility])   . 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 Emphysema of lung ([facility])   . GERD (gastroesophageal reflux disease)   . Headache   . History of frequent urinary tract infections   . Hx of renal cell cancer    LEFT  . Hyperlipidemia   . Hypertension   . Numbness and tingling    right hand   . Obesity   . Osteopenia 10/05/2017  . Peripheral artery disease ([facility])   . Urinary frequency   . Urinary incontinence  SOCIAL HX:  Social History   Tobacco Use  . Smoking status: Former Smoker    Packs/day: 1.50    Years: 51.00    Pack years: 76.50    Types: Cigarettes    Quit date: 05/24/2004    Years since quitting: 15.0  . Smokeless tobacco: Never Used  . Tobacco comment: smoking cessation materials not required  Substance Use Topics  . Alcohol use: Not Currently    ALLERGIES:  Allergies  Allergen Reactions  . Fentanyl Other (See Comments)     urine retention  . Penicillins Itching, Rash and Other (See Comments)    Has patient had a PCN reaction causing immediate rash, facial/tongue/throat swelling, SOB or lightheadedness with hypotension: Yes Has patient had a PCN reaction causing severe rash involving mucus membranes or skin necrosis: No Has patient had a PCN reaction that required hospitalization: No Has patient had a PCN reaction occurring within the last 10 years: Yes If all of the above answers are "NO", then may proceed with Cephalosporin use.     PERTINENT MEDICATIONS:  Outpatient Encounter Medications as of 06/13/2019  Medication Sig  . acetaminophen (TYLENOL) 325 MG tablet Take 325 mg by mouth every 6 (six) hours as needed.  [name] albuterol (VENTOLIN HFA) 108 (90 Base) MCG/ACT inhaler Inhale 1-2 puffs into the lungs every 4 (four) hours as needed.   [name] atorvastatin (LIPITOR) 20 MG tablet Take 1 tablet (20 mg total) by mouth daily at 6 PM.  . busPIRone (BUSPAR) 10 MG tablet Take 10 mg by mouth 3 (three) times daily.  . clopidogrel (PLAVIX) 75 MG tablet TAKE 1 TABLET EVERY DAY (Patient taking differently: Take 75 mg by mouth daily. )  . donepezil (ARICEPT) 10 MG tablet Take 1 tablet (10 mg total) by mouth at bedtime.  . famotidine (PEPCID) 20 MG tablet Take 1 tablet (20 mg total) by mouth daily.  . ferrous sulfate 325 (65 FE) MG tablet Take 325 mg by mouth daily with breakfast. Pt taking every other day  . FLUoxetine (PROZAC) 10 MG capsule Take 1 capsule (10 mg total) by mouth daily.  [name] gabapentin (NEURONTIN) 300 MG capsule Take 1 capsule (300 mg total) by mouth 3 (three) times daily.  [name] HYDROcodone-acetaminophen (NORCO) 7.5-325 MG tablet Take 1 tablet by mouth every 8 (eight) hours as needed. For chronic pain.  Okay to fill 1 day early if pharmacy closed on fill date.  [name] ON 07/04/2019] HYDROcodone-acetaminophen (NORCO) 7.5-325 MG tablet Take 1 tablet by mouth every 8 (eight) hours as needed. For chronic pain.  Okay to  fill 1 day early pharmacy closed on fill date  . ipratropium-albuterol (DUONEB) 0.5-2.5 (3) MG/3ML SOLN INHALE THE CONTENTS OF 1 VIAL VIA NEBULIZER THREE TIMES DAILY AS DIRECTED  . pantoprazole (PROTONIX) 40 MG tablet Take 1 tablet (40 mg total) by mouth daily.  . QUEtiapine (SEROQUEL) 25 MG tablet TAKE 1 TABLET EVERY MORNING, 1 TABLET EVERY AFTERNOON, AND 2 TABLETS BEFORE BEDTIME.  [name] Thiamine HCl (VITAMIN B-1) 250 MG tablet Take 250 mg by mouth daily.   No facility-administered encounter medications on file as of 06/13/2019.    PHYSICAL EXAM / ROS:   Current and past weights: unavailable General: NAD, frail appearing, obese Cardiovascular: no chest pain reported, no edema in LE Pulmonary: no cough, no increased SOB, oxygen continual Abdomen: appetite good, endorses occconstipation, incontinent of bowel GU: denies dysuria, incontinent of urine MSK:  Left AKA, non ambulatory Skin: excoriated skin breakdown incontinence and pressure Neurological:  Weakness,  Moderate to advanced dementia, FAST 6E.  Jason Coop, NP  COVID-19 PATIENT SCREENING TOOL  Person answering questions: ___________________ _[name]____   1.  Is the patient or any family member in the home showing any signs or symptoms regarding respiratory infection?               Person with Symptom- ___________________________  a. Fever                                                                          Yes___ No___          ___________________  b. Shortness of breath                                                    Yes___ No___          ___________________ c. Cough/congestion                                       Yes___  No___         ___________________ d. Body aches/pains                                                         Yes___ No___        ____________________ e. Gastrointestinal symptoms (diarrhea, nausea)           Yes___ No___        ____________________  2. Within the past 14 days, has anyone  living in the home had any contact with someone with or under investigation for COVID-19?    Yes___ No_x_   Person __________________

## 2019-06-14 ENCOUNTER — Other Ambulatory Visit: Payer: Medicare HMO | Admitting: Primary Care

## 2019-06-14 ENCOUNTER — Ambulatory Visit: Payer: Self-pay | Admitting: *Deleted

## 2019-06-14 DIAGNOSIS — N184 Chronic kidney disease, stage 4 (severe): Secondary | ICD-10-CM

## 2019-06-14 DIAGNOSIS — F02818 Dementia in other diseases classified elsewhere, unspecified severity, with other behavioral disturbance: Secondary | ICD-10-CM

## 2019-06-14 DIAGNOSIS — F0281 Dementia in other diseases classified elsewhere with behavioral disturbance: Secondary | ICD-10-CM

## 2019-06-14 NOTE — Chronic Care Management (AMB) (Signed)
Chronic Care Management    Clinical Social Work Follow Up Note  06/14/2019 Name: Amy [name] MRN: [id number] DOB: 1935/06/10  Amy [name] is a 84 y.o. year old female who is a primary care patient of [name], [place]. The CCM team was consulted for assistance with Food Insecurity, [name]  and Financial Difficulties related to unexplained medical bills.   Review of patient status, including review of consultants reports, other relevant assessments, and collaboration with appropriate care team members and the patient's provider was performed as part of comprehensive patient evaluation and provision of chronic care management services.    Advanced Directives Status: <no information> See Care Plan for related entries.   Outpatient Encounter Medications as of 06/14/2019  Medication Sig  . acetaminophen (TYLENOL) 325 MG tablet Take 325 mg by mouth every 6 (six) hours as needed.  [name] albuterol (VENTOLIN HFA) 108 (90 Base) MCG/ACT inhaler Inhale 1-2 puffs into the lungs every 4 (four) hours as needed.   [name] atorvastatin (LIPITOR) 20 MG tablet Take 1 tablet (20 mg total) by mouth daily at 6 PM.  . busPIRone (BUSPAR) 10 MG tablet Take 10 mg by mouth 3 (three) times daily.  . clopidogrel (PLAVIX) 75 MG tablet TAKE 1 TABLET EVERY DAY (Patient taking differently: Take 75 mg by mouth daily. )  . donepezil (ARICEPT) 10 MG tablet Take 1 tablet (10 mg total) by mouth at bedtime.  . famotidine (PEPCID) 20 MG tablet Take 1 tablet (20 mg total) by mouth daily.  . ferrous sulfate 325 (65 FE) MG tablet Take 325 mg by mouth daily with breakfast. Pt taking every other day  . FLUoxetine (PROZAC) 10 MG capsule Take 1 capsule (10 mg total) by mouth daily.  [name] gabapentin (NEURONTIN) 300 MG capsule Take 1 capsule (300 mg total) by mouth 3 (three) times daily.  [name] HYDROcodone-acetaminophen (NORCO) 7.5-325 MG tablet Take 1 tablet by mouth every 8 (eight) hours as needed. For chronic pain.  Okay to  fill 1 day early if pharmacy closed on fill date.  [name] ON 07/04/2019] HYDROcodone-acetaminophen (NORCO) 7.5-325 MG tablet Take 1 tablet by mouth every 8 (eight) hours as needed. For chronic pain.  Okay to fill 1 day early pharmacy closed on fill date  . ipratropium-albuterol (DUONEB) 0.5-2.5 (3) MG/3ML SOLN INHALE THE CONTENTS OF 1 VIAL VIA NEBULIZER THREE TIMES DAILY AS DIRECTED  . pantoprazole (PROTONIX) 40 MG tablet Take 1 tablet (40 mg total) by mouth daily.  . QUEtiapine (SEROQUEL) 25 MG tablet TAKE 1 TABLET EVERY MORNING, 1 TABLET EVERY AFTERNOON, AND 2 TABLETS BEFORE BEDTIME.  [name] Thiamine HCl (VITAMIN B-1) 250 MG tablet Take 250 mg by mouth daily.   No facility-administered encounter medications on file as of 06/14/2019.     Goals Addressed            This Visit's Progress   . "I don't understandthese medical bills"       Current Barriers:  . Literacy concerns related to unexplained medical bills for DME  Clinical Social Work Clinical Goal(s):  [name] Over the next 90 days, patient's spouse will work with [employer] company to address needs related to unexplained medical bills  Interventions:  . Discussed confusion about patient's oxygen bills and offered for patient's spouse to meet this social worker at the office to review them . Patient's spouse confirmed that patient is resting and doing much better . Confirmed that patient's spouse now feels that he does not need  the additional in home support and will use existing help already arranged . Patient's spouse confirmed patient being open to Palliative Care with a nurse coming out to assess patient yesterday   Patient Self Care Activities:  . Patient's spouse  verbalizes understanding of plan  . Attends all scheduled provider appointments . Unable to perform ADLs independently . Unable to perform IADLs independently . Knowledge deficit regarding medical bills and reimbursement responsibilities  Please see past  updates related to this goal by clicking on the "Past Updates" button in the selected goal          Follow Up Plan: Client will call this social worker if there are any additional questions or concerns   Elliot Gurney, [place] Administrator, [facility] [phone]

## 2019-06-14 NOTE — Patient Instructions (Signed)
Thank you allowing the Chronic Care Management Team to be a part of your care! It was a pleasure speaking with you today!  1. Please call this social worker if needed in the future to assist with your community resource needs.  CCM (Chronic Care Management) Team   Neldon Labella  RN, BSN Nurse Care Coordinator  [phone]  Ruben Reason PharmD  Clinical Pharmacist  [phone]   Elliot Gurney, LCSW Clinical Social Worker [phone]  Goals Addressed            This Visit's Progress   . "I don't understandthese medical bills"       Current Barriers:  . Literacy concerns related to unexplained medical bills for DME  Clinical Social Work Clinical Goal(s):  [name] Over the next 90 days, patient's spouse will work with [employer] company to address needs related to unexplained medical bills  Interventions:  . Discussed confusion about patient's oxygen bills and offered for patient's spouse to meet this social worker at the office to review them . Patient's spouse confirmed that patient is resting and doing much better . Confirmed that patient's spouse now feels that he does not need the additional in home support and will use existing help already arranged . Patient's spouse confirmed patient being open to Palliative Care with a nurse coming out to assess patient yesterday   Patient Self Care Activities:  . Patient's spouse  verbalizes understanding of plan  . Attends all scheduled provider appointments . Unable to perform ADLs independently . Unable to perform IADLs independently . Knowledge deficit regarding medical bills and reimbursement responsibilities  Please see past updates related to this goal by clicking on the "Past Updates" button in the selected goal          The patient's spouse verbalized understanding of instructions provided today and declined a print copy of patient instruction materials.   No further follow up required: patient's  spouse to call this social worker if further assistance is needed with unexplained medical bills

## 2019-06-18 ENCOUNTER — Ambulatory Visit: Payer: Self-pay | Admitting: *Deleted

## 2019-06-18 NOTE — Chronic Care Management (AMB) (Signed)
  Chronic Care Management   Social Work Note  06/18/2019 Name: Amy [name] MRN: [id number] DOB: [birth year]  Amy [name] is a 84 y.o. year old female who sees [name], FNP for primary care. The CCM team was consulted for assistance with [name] .  Phone call from patient's spouse to schedule time to meet this social worker at the [facility] to review unexplained medical bills.  Outpatient Encounter Medications as of 06/18/2019  Medication Sig  . acetaminophen (TYLENOL) 325 MG tablet Take 325 mg by mouth every 6 (six) hours as needed.  [name] albuterol (VENTOLIN HFA) 108 (90 Base) MCG/ACT inhaler Inhale 1-2 puffs into the lungs every 4 (four) hours as needed.   [name] atorvastatin (LIPITOR) 20 MG tablet Take 1 tablet (20 mg total) by mouth daily at 6 PM.  . busPIRone (BUSPAR) 10 MG tablet Take 10 mg by mouth 3 (three) times daily.  . clopidogrel (PLAVIX) 75 MG tablet TAKE 1 TABLET EVERY DAY (Patient taking differently: Take 75 mg by mouth daily. )  . donepezil (ARICEPT) 10 MG tablet Take 1 tablet (10 mg total) by mouth at bedtime.  . famotidine (PEPCID) 20 MG tablet Take 1 tablet (20 mg total) by mouth daily.  . ferrous sulfate 325 (65 FE) MG tablet Take 325 mg by mouth daily with breakfast. Pt taking every other day  . FLUoxetine (PROZAC) 10 MG capsule Take 1 capsule (10 mg total) by mouth daily.  [name] gabapentin (NEURONTIN) 300 MG capsule Take 1 capsule (300 mg total) by mouth 3 (three) times daily.  [name] HYDROcodone-acetaminophen (NORCO) 7.5-325 MG tablet Take 1 tablet by mouth every 8 (eight) hours as needed. For chronic pain.  Okay to fill 1 day early if pharmacy closed on fill date.  [name] ON 07/04/2019] HYDROcodone-acetaminophen (NORCO) 7.5-325 MG tablet Take 1 tablet by mouth every 8 (eight) hours as needed. For chronic pain.  Okay to fill 1 day early pharmacy closed on fill date  . ipratropium-albuterol (DUONEB) 0.5-2.5 (3) MG/3ML SOLN INHALE THE  CONTENTS OF 1 VIAL VIA NEBULIZER THREE TIMES DAILY AS DIRECTED  . pantoprazole (PROTONIX) 40 MG tablet Take 1 tablet (40 mg total) by mouth daily.  . QUEtiapine (SEROQUEL) 25 MG tablet TAKE 1 TABLET EVERY MORNING, 1 TABLET EVERY AFTERNOON, AND 2 TABLETS BEFORE BEDTIME.  [name] Thiamine HCl (VITAMIN B-1) 250 MG tablet Take 250 mg by mouth daily.   No facility-administered encounter medications on file as of 06/18/2019.    Goals Addressed   None     Follow Up Plan: Appointment scheduled for SW to meet with client in provider office on: 06/19/19 at 11:00am  Elliot Gurney, [place] Worker  [facility] Care Management [phone]    \

## 2019-06-19 ENCOUNTER — Ambulatory Visit: Payer: Self-pay | Admitting: *Deleted

## 2019-06-19 DIAGNOSIS — F0281 Dementia in other diseases classified elsewhere with behavioral disturbance: Secondary | ICD-10-CM

## 2019-06-19 DIAGNOSIS — N184 Chronic kidney disease, stage 4 (severe): Secondary | ICD-10-CM | POA: Diagnosis not present

## 2019-06-19 DIAGNOSIS — J449 Chronic obstructive pulmonary disease, unspecified: Secondary | ICD-10-CM | POA: Diagnosis not present

## 2019-06-19 DIAGNOSIS — F02818 Dementia in other diseases classified elsewhere, unspecified severity, with other behavioral disturbance: Secondary | ICD-10-CM

## 2019-06-19 DIAGNOSIS — G301 Alzheimer's disease with late onset: Secondary | ICD-10-CM

## 2019-06-19 NOTE — Chronic Care Management (AMB) (Signed)
Chronic Care Management    Clinical Social Work Follow Up Note  06/19/2019 Name: Amy [name] MRN: [id number] DOB: 12-Sep-1935  Amy [name] is a 84 y.o. year old female who is a primary care patient of Amy [name], Amy [name]. The CCM team was consulted for assistance with Financial Difficulties related to unexplained medical bills.   Review of patient status, including review of consultants reports, other relevant assessments, and collaboration with appropriate care team members and the patient's provider was performed as part of comprehensive patient evaluation and provision of chronic care management services.    Advanced Directives Status: <no information> See Care Plan for related entries.   Outpatient Encounter Medications as of 06/19/2019  Medication Sig  . acetaminophen (TYLENOL) 325 MG tablet Take 325 mg by mouth every 6 (six) hours as needed.  [name] albuterol (VENTOLIN HFA) 108 (90 Base) MCG/ACT inhaler Inhale 1-2 puffs into the lungs every 4 (four) hours as needed.   [name] atorvastatin (LIPITOR) 20 MG tablet Take 1 tablet (20 mg total) by mouth daily at 6 PM.  . busPIRone (BUSPAR) 10 MG tablet Take 10 mg by mouth 3 (three) times daily.  . clopidogrel (PLAVIX) 75 MG tablet TAKE 1 TABLET EVERY DAY (Patient taking differently: Take 75 mg by mouth daily. )  . donepezil (ARICEPT) 10 MG tablet Take 1 tablet (10 mg total) by mouth at bedtime.  . famotidine (PEPCID) 20 MG tablet Take 1 tablet (20 mg total) by mouth daily.  . ferrous sulfate 325 (65 FE) MG tablet Take 325 mg by mouth daily with breakfast. Pt taking every other day  . FLUoxetine (PROZAC) 10 MG capsule Take 1 capsule (10 mg total) by mouth daily.  [name] gabapentin (NEURONTIN) 300 MG capsule Take 1 capsule (300 mg total) by mouth 3 (three) times daily.  [name] HYDROcodone-acetaminophen (NORCO) 7.5-325 MG tablet Take 1 tablet by mouth every 8 (eight) hours as needed. For chronic pain.  Okay to fill 1 day early if pharmacy closed on  fill date.  [name] ON 07/04/2019] HYDROcodone-acetaminophen (NORCO) 7.5-325 MG tablet Take 1 tablet by mouth every 8 (eight) hours as needed. For chronic pain.  Okay to fill 1 day early pharmacy closed on fill date  . ipratropium-albuterol (DUONEB) 0.5-2.5 (3) MG/3ML SOLN INHALE THE CONTENTS OF 1 VIAL VIA NEBULIZER THREE TIMES DAILY AS DIRECTED  . pantoprazole (PROTONIX) 40 MG tablet Take 1 tablet (40 mg total) by mouth daily.  . QUEtiapine (SEROQUEL) 25 MG tablet TAKE 1 TABLET EVERY MORNING, 1 TABLET EVERY AFTERNOON, AND 2 TABLETS BEFORE BEDTIME.  [name] Thiamine HCl (VITAMIN B-1) 250 MG tablet Take 250 mg by mouth daily.   No facility-administered encounter medications on file as of 06/19/2019.     Goals Addressed            This Visit's Progress   . "I don't understandthese medical bills"       Current Barriers:  . Literacy concerns related to unexplained medical bills for DME  Clinical Social Work Clinical Goal(s):  [name] Over the next 90 days, patient's spouse will work with [employer] company to address needs related to unexplained medical bills  Interventions:  [name] Met with patient's spouse regarding oxygen bills  . Oxygen bills reviewed, conference call made to [employer] for further clarification . Billing clarified, patient had duplicate accounts for the same medical equipment.  . Duplicate account closed, patient now has 0 balance, auto pay turned on, bill expected approximately on the 9th  of each month, bill expected to be $24.71 per month. . Confirmed that patient's spouse now feels that he does not need the additional in home support and will use existing help already arranged . Patient's spouse confirmed patient being open to Palliative Care with a nurse coming out to assess patient last week   Patient Self Care Activities:  . Patient's spouse  verbalizes understanding of plan  . Attends all scheduled provider appointments . Unable to perform ADLs independently . Unable  to perform IADLs independently . Knowledge deficit regarding medical bills and reimbursement responsibilities  Please see past updates related to this goal by clicking on the "Past Updates" button in the selected goal          Follow Up Plan: Client's spouse will contact this social worker wih any further questions related to unexplained medical bills or communithy resource needs.   Amy [name], [place] [facility] [phone]

## 2019-06-19 NOTE — Patient Instructions (Signed)
Thank you allowing the Chronic Care Management Team to be a part of your care! It was a pleasure speaking with you today!  1. Please call this social worker with any additional questions regarding your community resource needs.  CCM (Chronic Care Management) Team   Neldon Labella RN, BSN Nurse Care Coordinator  [phone]  Ruben Reason PharmD  Clinical Pharmacist  [phone]   Elliot Gurney, LCSW Clinical Social Worker [phone]  Goals Addressed            This Visit's Progress   . "I don't understandthese medical bills"       Current Barriers:  . Literacy concerns related to unexplained medical bills for DME  Clinical Social Work Clinical Goal(s):  [name] Over the next 90 days, patient's spouse will work with [employer] company to address needs related to unexplained medical bills  Interventions:  [name] Met with patient's spouse regarding oxygen bills  . Oxygen bills reviewed, conference call made to [employer] for further clarification . Billing clarified, patient had duplicate accounts for the same medical equipment.  . Duplicate account closed, patient now has 0 balance, auto pay turned on, bill expected approximately on the 9th of each month, bill expected to be $24.71 per month. . Confirmed that patient's spouse now feels that he does not need the additional in home support and will use existing help already arranged . Patient's spouse confirmed patient being open to Palliative Care with a nurse coming out to assess patient last week   Patient Self Care Activities:  . Patient's spouse  verbalizes understanding of plan  . Attends all scheduled provider appointments . Unable to perform ADLs independently . Unable to perform IADLs independently . Knowledge deficit regarding medical bills and reimbursement responsibilities  Please see past updates related to this goal by clicking on the "Past Updates" button in the selected goal          The patient  verbalized understanding of instructions provided today and declined a print copy of patient instruction materials.   No further follow up required: patient's spouse will call with any additional questions regarding unexplained medical bills or any community resource needs

## 2019-06-20 DIAGNOSIS — L98491 Non-pressure chronic ulcer of skin of other sites limited to breakdown of skin: Secondary | ICD-10-CM | POA: Diagnosis not present

## 2019-06-25 ENCOUNTER — Encounter: Payer: Self-pay | Admitting: Family Medicine

## 2019-06-25 ENCOUNTER — Other Ambulatory Visit: Payer: Self-pay

## 2019-06-25 ENCOUNTER — Ambulatory Visit (INDEPENDENT_AMBULATORY_CARE_PROVIDER_SITE_OTHER): Payer: Medicare HMO | Admitting: Family Medicine

## 2019-06-25 VITALS — BP 132/78 | HR 86 | Temp 98.9°F | Resp 16

## 2019-06-25 DIAGNOSIS — G301 Alzheimer's disease with late onset: Secondary | ICD-10-CM | POA: Diagnosis not present

## 2019-06-25 DIAGNOSIS — N184 Chronic kidney disease, stage 4 (severe): Secondary | ICD-10-CM

## 2019-06-25 DIAGNOSIS — F0281 Dementia in other diseases classified elsewhere with behavioral disturbance: Secondary | ICD-10-CM | POA: Diagnosis not present

## 2019-06-25 DIAGNOSIS — R300 Dysuria: Secondary | ICD-10-CM

## 2019-06-25 DIAGNOSIS — F02818 Dementia in other diseases classified elsewhere, unspecified severity, with other behavioral disturbance: Secondary | ICD-10-CM

## 2019-06-25 LAB — COMPLETE METABOLIC PANEL WITH GFR
AG Ratio: 1.2 (calc) (ref 1.0–2.5)
ALT: 10 U/L (ref 6–29)
AST: 14 U/L (ref 10–35)
Albumin: 3.8 g/dL (ref 3.6–5.1)
Alkaline phosphatase (APISO): 211 U/L — ABNORMAL HIGH (ref 37–153)
BUN/Creatinine Ratio: 16 (calc) (ref 6–22)
BUN: 35 mg/dL — ABNORMAL HIGH (ref 7–25)
CO2: 29 mmol/L (ref 20–32)
Calcium: 9 mg/dL (ref 8.6–10.4)
Chloride: 104 mmol/L (ref 98–110)
Creat: 2.15 mg/dL — ABNORMAL HIGH (ref 0.60–0.88)
GFR, Est African American: 24 mL/min/{1.73_m2} — ABNORMAL LOW (ref >=60)
GFR, Est Non African American: 21 mL/min/{1.73_m2} — ABNORMAL LOW (ref >=60)
Globulin: 3.1 g/dL (calc) (ref 1.9–3.7)
Glucose, Bld: 126 mg/dL — ABNORMAL HIGH (ref 65–99)
Potassium: 5 mmol/L (ref 3.5–5.3)
Sodium: 144 mmol/L (ref 135–146)
Total Bilirubin: 1 mg/dL (ref 0.2–1.2)
Total Protein: 6.9 g/dL (ref 6.1–8.1)

## 2019-06-25 LAB — CBC WITH DIFFERENTIAL/PLATELET
Absolute Monocytes: 760 cells/uL (ref 200–950)
Basophils Absolute: 57 cells/uL (ref 0–200)
Basophils Relative: 0.6 %
Eosinophils Absolute: 599 cells/uL — ABNORMAL HIGH (ref 15–500)
Eosinophils Relative: 6.3 %
HCT: 36 % (ref 35.0–45.0)
Hemoglobin: 11.6 g/dL — ABNORMAL LOW (ref 11.7–15.5)
Lymphs Abs: 1169 cells/uL (ref 850–3900)
MCH: 28.2 pg (ref 27.0–33.0)
MCHC: 32.2 g/dL (ref 32.0–36.0)
MCV: 87.6 fL (ref 80.0–100.0)
MPV: 11 fL (ref 7.5–12.5)
Monocytes Relative: 8 %
Neutro Abs: 6916 cells/uL (ref 1500–7800)
Neutrophils Relative %: 72.8 %
Platelets: 237 10*3/uL (ref 140–400)
RBC: 4.11 10*6/uL (ref 3.80–5.10)
RDW: 15.1 % — ABNORMAL HIGH (ref 11.0–15.0)
Total Lymphocyte: 12.3 %
WBC: 9.5 10*3/uL (ref 3.8–10.8)

## 2019-06-25 MED ORDER — CEPHALEXIN 500 MG PO CAPS: 500.0000 mg | ORAL_CAPSULE | Freq: Three times a day (TID) | ORAL | 0 refills | Status: AC

## 2019-06-25 MED ORDER — LIDOCAINE HCL (PF) 1 % IJ SOLN
2.0000 mL | Freq: Once | INTRAMUSCULAR | Status: AC
  Administered 2019-06-25: 2 mL via INTRADERMAL

## 2019-06-25 MED ORDER — CEFTRIAXONE SODIUM 1 G IJ SOLR
1.0000 g | Freq: Once | INTRAMUSCULAR | Status: AC
  Administered 2019-06-25: 1 g via INTRAMUSCULAR

## 2019-06-25 MED ORDER — CEFTRIAXONE SODIUM 1 G IJ SOLR: 1.0000 g | Freq: Once | INTRAMUSCULAR | Status: DC

## 2019-06-25 NOTE — Patient Instructions (Addendum)
Push fluids at home make sure she is urinating appropriate amount.  Take antibiotics 3 times a day.  If her complaints of pain and burning when she pees, sleepiness or weakness does not improve in the next 1 to 2 days you need to go to the ER to get evaluated, or you may be able to go to urgent care but if she is worsening they will likely transfer you to the ER for further evaluation.  If she gets confused, is not eating, drinking or peeing any to go right away.    If at home it is difficult for you to help transfer her or she is too weak please go to the ER or hospital because it would be better to have a long ER or hospital visit than it would be for you guys to get injured or be heard at home having to call the ambulance.  We are treating for a suspected UTI - please see following info.  Without any urine to test we do not know if she will have a resistant bacteria, so if she is worsening you have to go get checked somewhere in person.    Urinary Tract Infection, Adult A urinary tract infection (UTI) is an infection of any part of the urinary tract. The urinary tract includes:  The kidneys.  The ureters.  The bladder.  The urethra. These organs make, store, and get rid of pee (urine) in the body. What are the causes? This is caused by germs (bacteria) in your genital area. These germs grow and cause swelling (inflammation) of your urinary tract. What increases the risk? You are more likely to develop this condition if:  You have a small, thin tube (catheter) to drain pee.  You cannot control when you pee or poop (incontinence).  You are female, and: ? You use these methods to prevent pregnancy:  A medicine that kills sperm (spermicide).  A device that blocks sperm (diaphragm). ? You have low levels of a female hormone (estrogen). ? You are pregnant.  You have genes that add to your risk.  You are sexually active.  You take antibiotic medicines.  You have trouble  peeing because of: ? A prostate that is bigger than normal, if you are female. ? A blockage in the part of your body that drains pee from the bladder (urethra). ? A kidney stone. ? A nerve condition that affects your bladder (neurogenic bladder). ? Not getting enough to drink. ? Not peeing often enough.  You have other conditions, such as: ? Diabetes. ? A weak disease-fighting system (immune system). ? Sickle cell disease. ? Gout. ? Injury of the spine. What are the signs or symptoms? Symptoms of this condition include:  Needing to pee right away (urgently).  Peeing often.  Peeing small amounts often.  Pain or burning when peeing.  Blood in the pee.  Pee that smells bad or not like normal.  Trouble peeing.  Pee that is cloudy.  Fluid coming from the vagina, if you are female.  Pain in the belly or lower back. Other symptoms include:  Throwing up (vomiting).  No urge to eat.  Feeling mixed up (confused).  Being tired and grouchy (irritable).  A fever.  Watery poop (diarrhea). How is this treated? This condition may be treated with:  Antibiotic medicine.  Other medicines.  Drinking enough water. Follow these instructions at home:  Medicines  Take over-the-counter and prescription medicines only as told by your doctor.  If you  were prescribed an antibiotic medicine, take it as told by your doctor. Do not stop taking it even if you start to feel better. General instructions  Make sure you: ? Pee until your bladder is empty. ? Do not hold pee for a long time. ? Empty your bladder after sex. ? Wipe from front to back after pooping if you are a female. Use each tissue one time when you wipe.  Drink enough fluid to keep your pee pale yellow.  Keep all follow-up visits as told by your doctor. This is important. Contact a doctor if:  You do not get better after 1-2 days.  Your symptoms go away and then come back. Get help right away if  You have  very bad back pain.  You have very bad pain in your lower belly.  You have a fever.  You are sick to your stomach (nauseous).  You are throwing up. Summary  A urinary tract infection (UTI) is an infection of any part of the urinary tract.  This condition is caused by germs in your genital area.  There are many risk factors for a UTI. These include having a small, thin tube to drain pee and not being able to control when you pee or poop.  Treatment includes antibiotic medicines for germs.  Drink enough fluid to keep your pee pale yellow. This information is not intended to replace advice given to you by your health care provider. Make sure you discuss any questions you have with your health care provider. Document Revised: 04/27/2018 Document Reviewed: 11/17/2017 Elsevier Patient Education  2020 [employer].

## 2019-06-25 NOTE — Progress Notes (Signed)
Patient ID: Amy [name], female    DOB: 1935-12-12, 84 y.o.   MRN: [id number]  PCP: [name], FNP  Chief Complaint  Patient presents with  . Urinary Tract Infection    unable to give urine sample, back pain, stomach pain, strong foul odor and burning sensation    Subjective:   Amy [name] is a 84 y.o. female, presents to clinic with CC of the following:  HPI  Patient is 84 year old female presents with her husband who gives the majority of the history today, she has complicated medical history, currently presents with suspected UTI onset about 2 days ago.  Patient and her husband even though discussing this visit for the last day or so, have been unable to obtain a urine sample.   Fatigue, abd pain, back pain, dysuria, onset yesterday and today.  Patient's husband is here lending most of the history, states that yesterday she was complaining of abdominal pain back pain and side pain also burning when she peed.  She has had normal appetite, has been eating and drinking normally with normal urine output and without any known hematuria, urine however is malodorous.  History of UTIs -chart reviewed today for multiple visits over the last year to the ER and to urology - UTI in the past year with E. coli and with Morganella morganii, also history of UTI presenting with urinary retention -had to have a indwelling catheter for short period of time.  Currently patient is able to help transfer on her right leg from wheelchair to bed or chairs at home she wears depends. She is status post left AKA, wheelchair-bound, has known pressure ulcers to her bottom, also has dementia.  Patient in the exam room currently denies any pain to her abdomen back or side and I cannot elicit any pain for her.  She is able to tell me who she is where she is but not what day or month it is. Her husband states that her dementia is near baseline no increased confusion but she is little more tired than  normal and a little weaker than normal when he is transferring her at home.  He is having to grab onto her waist more and do more the lifting than usual. No fever, chills, sweats, diarrhea, vomiting.  Patient is on oxygen with history of COPD she coughs intermittently throughout the visit today, they state that she is continuing to require 2 L has not had any increased oxygen demand, no increased coughing wheeze or shortness of breath.    She has penicillin allergy but has tolerated both rocephin and keflex in the past year - verified through chart review - pt reports allergy to penicillin but no anaphylactic or immediate rash/SOB rxn.  Pt has history of cancer, being taken care of at home by her elderly husband, palliative care set up, but no HH or regular assistance at home.  Staff notes the patient and her spouse will state that they have refused most of the help offered to them and prefer to do things at home by themselves.   In the exam room patient's husband provides a unclear history he initially stated that pain in her abdomen side and back is where she has had radiation and cancer treated when asking him specifically what cancer was treated he states they did 5 sessions and it is all gone.  He had difficulty stating exactly which cancer he was referring to she does have history of gastric cancer  and also hx of left renal cell carcinoma status post left nephrectomy.  Patient's husband says they have a follow-up next month with the cancer doctor.  In reviewing a few recent records there have been no known rib hypermetabolic metastases, most recent CT in October showed multiple bilateral pulmonary nodule suspicious for pulmonary metastases.  I was not able to find any prognosis patient does have DNR and MOST form but does want fluids and antibiotics if needed.  History is somewhat limited by both the patient and the patient's husband  Patient Active Problem List   Diagnosis Date Noted  . Bone  metastasis ([place]) 03/13/2019  . Rib lesion 03/08/2019  . Right lower lobe pulmonary nodule 02/08/2019  . Cancer of intra-abdominal lymph nodes ([place]) 01/19/2019  . Genital labial ulcer 12/06/2018  . B12 deficiency 11/05/2018  . Folate deficiency 11/05/2018  . Gastric cancer ([place])   . Stomach irritation   . Obesity (BMI 30.0-34.9) 09/11/2018  . Pulmonary nodules 08/27/2018  . Goals of care, counseling/discussion   . Palliative care by specialist   . Hypoxia 07/26/2018  . HCAP (healthcare-associated pneumonia) 07/02/2018  . Hypoxemia   . Renal cell carcinoma ([place])   . Lung nodule 04/23/2018  . Hypertensive renal disease 11/08/2017  . Secondary hyperparathyroidism of renal origin ([place]) 11/08/2017  . QT prolongation 10/27/2017  . Osteopenia 10/05/2017  . Choledocholithiasis 08/13/2017  . Chronic pain syndrome 04/05/2017  . Depression 11/15/2016  . Iron deficiency anemia 09/08/2016  . Anemia 06/29/2016  . Late onset Alzheimer's disease with behavioral disturbance ([place]) 06/14/2016  . S/P AKA (above knee amputation) unilateral, left ([place]) 06/08/2016  . Wheelchair dependent 03/11/2016  . 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 S/p nephrectomy 01/29/2015  . Renal neoplasm 01/15/2015  . Abnormal EKG 01/02/2015  . Renal mass 12/05/2014  . GERD (gastroesophageal reflux disease) 11/20/2014  . GAD (generalized anxiety disorder) 11/20/2014  . AB (asthmatic bronchitis) 11/20/2014  . Aortic atherosclerosis ([place]) 11/20/2014  . Cataract 11/20/2014  . Leg pain 11/20/2014  . Continuous opioid dependence ([place]) 11/20/2014  . CKD (chronic kidney disease) stage 4, GFR 15-29 ml/min ([facility]) 11/20/2014  . COPD (chronic obstructive pulmonary disease) ([place]) 11/20/2014  . Excessive falling 11/20/2014  . Fatty infiltration of liver 11/20/2014  . Hyperlipidemia 11/20/2014  . Anterior knee pain 11/20/2014  . LBP (low back pain) 11/20/2014  . Malaise and fatigue 11/20/2014  .  Abnormal presence of protein in urine 11/20/2014  . PVD (peripheral vascular disease) ([place]) 11/20/2014  . Allergic rhinitis 11/20/2014  . At risk for falling 11/20/2014  . Anxiety 11/05/2014  . Elevated serum creatinine 11/05/2014  . Diabetes mellitus type 2, controlled ([place]) 11/05/2014  . H/O adenomatous polyp of colon 07/27/2010      Current Outpatient Medications:  .  acetaminophen (TYLENOL) 325 MG tablet, Take 325 mg by mouth every 6 (six) hours as needed., Disp: , Rfl:  .  atorvastatin (LIPITOR) 20 MG tablet, Take 1 tablet (20 mg total) by mouth daily at 6 PM., Disp: 90 tablet, Rfl: 3 .  busPIRone (BUSPAR) 10 MG tablet, Take 10 mg by mouth 3 (three) times daily., Disp: , Rfl:  .  clopidogrel (PLAVIX) 75 MG tablet, TAKE 1 TABLET EVERY DAY (Patient taking differently: Take 75 mg by mouth daily. ), Disp: 90 tablet, Rfl: 3 .  donepezil (ARICEPT) 10 MG tablet, Take 1 tablet (10 mg total) by mouth at bedtime., Disp: 90 tablet, Rfl: 3 .  famotidine (PEPCID) 20  MG tablet, Take 1 tablet (20 mg total) by mouth daily., Disp: 90 tablet, Rfl: 3 .  FLUoxetine (PROZAC) 10 MG capsule, Take 1 capsule (10 mg total) by mouth daily., Disp: 30 capsule, Rfl: 0 .  gabapentin (NEURONTIN) 300 MG capsule, Take 1 capsule (300 mg total) by mouth 3 (three) times daily., Disp: 270 capsule, Rfl: 3 .  HYDROcodone-acetaminophen (NORCO) 7.5-325 MG tablet, Take 1 tablet by mouth every 8 (eight) hours as needed. For chronic pain.  Okay to fill 1 day early if pharmacy closed on fill date., Disp: 90 tablet, Rfl: 0 .  [START ON 07/04/2019] HYDROcodone-acetaminophen (NORCO) 7.5-325 MG tablet, Take 1 tablet by mouth every 8 (eight) hours as needed. For chronic pain.  Okay to fill 1 day early pharmacy closed on fill date, Disp: 90 tablet, Rfl: 0 .  ipratropium-albuterol (DUONEB) 0.5-2.5 (3) MG/3ML SOLN, INHALE THE CONTENTS OF 1 VIAL VIA NEBULIZER THREE TIMES DAILY AS DIRECTED, Disp: 810 mL, Rfl: 2 .  pantoprazole (PROTONIX) 40 MG  tablet, Take 1 tablet (40 mg total) by mouth daily., Disp: 90 tablet, Rfl: 1 .  QUEtiapine (SEROQUEL) 25 MG tablet, TAKE 1 TABLET EVERY MORNING, 1 TABLET EVERY AFTERNOON, AND 2 TABLETS BEFORE BEDTIME., Disp: 300 tablet, Rfl: 0 .  albuterol (VENTOLIN HFA) 108 (90 Base) MCG/ACT inhaler, Inhale 1-2 puffs into the lungs every 4 (four) hours as needed. , Disp: , Rfl:  .  ferrous sulfate 325 (65 FE) MG tablet, Take 325 mg by mouth daily with breakfast. Pt taking every other day, Disp: , Rfl:  .  Thiamine HCl (VITAMIN B-1) 250 MG tablet, Take 250 mg by mouth daily., Disp: , Rfl:    Allergies  Allergen Reactions  . Fentanyl Other (See Comments)    urine retention  . Penicillins Itching, Rash and Other (See Comments)    Has patient had a PCN reaction causing immediate rash, facial/tongue/throat swelling, SOB or lightheadedness with hypotension: Yes Has patient had a PCN reaction causing severe rash involving mucus membranes or skin necrosis: No Has patient had a PCN reaction that required hospitalization: No Has patient had a PCN reaction occurring within the last 10 years: Yes If all of the above answers are "NO", then may proceed with Cephalosporin use.     Family History  Problem Relation Age of Onset  . Cancer Mother        Stomach  . Cancer Father   . Diabetes Brother   . Cancer Brother        oral  . Healthy Son   . Healthy Sister      Social History   Socioeconomic History  . Marital status: Married    Spouse name: [name]  . Number of children: 1  . Years of education: Not on file  . Highest education level: 10th grade  Occupational History  . Occupation: Retired  Tobacco Use  . Smoking status: Former Smoker    Packs/day: 1.50    Years: 51.00    Pack years: 76.50    Types: Cigarettes    Quit date: 05/24/2004    Years since quitting: 15.0  . Smokeless tobacco: Never Used  . Tobacco comment: smoking cessation materials not required  Substance and Sexual Activity  .  Alcohol use: Not Currently  . Drug use: No  . Sexual activity: Not Currently  Other Topics Concern  . Not on file  Social History Narrative  . Not on file   Social Determinants of Health   Financial Resource  Strain:   . Difficulty of Paying Living Expenses: Not on file  Food Insecurity:   . Worried About Charity fundraiser in the Last Year: Not on file  . Ran Out of Food in the Last Year: Not on file  Transportation Needs:   . Lack of Transportation (Medical): Not on file  . Lack of Transportation (Non-Medical): Not on file  Physical Activity:   . Days of Exercise per Week: Not on file  . Minutes of Exercise per Session: Not on file  Stress:   . Feeling of Stress : Not on file  Social Connections: Unknown  . Frequency of Communication with Friends and Family: Not on file  . Frequency of Social Gatherings with Friends and Family: Not on file  . Attends Religious Services: Never  . Active Member of Clubs or Organizations: No  . Attends Archivist Meetings: Never  . Marital Status: Not on file  Intimate Partner Violence:   . Fear of Current or Ex-Partner: Not on file  . Emotionally Abused: Not on file  . Physically Abused: Not on file  . Sexually Abused: Not on file    Chart Review Today: I personally reviewed active problem list, medication list, allergies, family history, social history, health maintenance, notes from last encounter, lab results, imaging with the patient/caregiver today. Reviewed several visits, recent CT scans, lab work, micro, past ER and urology visits  Review of Systems  Unable to perform ROS: Dementia  Constitutional: Negative.   HENT: Negative.   Eyes: Negative.   Respiratory: Negative.   Cardiovascular: Negative.   Gastrointestinal: Negative.   Endocrine: Negative.   Genitourinary: Negative.   Musculoskeletal: Negative.   Skin: Negative.   Allergic/Immunologic: Negative.   Neurological: Negative.   Hematological: Negative.     Psychiatric/Behavioral: Negative.   All other systems reviewed and are negative.      Objective:   Vitals:   06/25/19 1416  BP: 132/78  Pulse: 86  Resp: 16  Temp: 98.9 F (37.2 C)  SpO2: 96%    There is no height or weight on file to calculate BMI.  Physical Exam Vitals and nursing note reviewed.  Constitutional:      Appearance: She is well-developed. She is obese. She is ill-appearing. She is not toxic-appearing or diaphoretic.     Interventions: Nasal cannula and face mask in place.     Comments: Patient slightly somnolent, but arouses easily to conversation, answers questions mostly appropriately is alert and oriented x2 Appears chronically ill  HENT:     Head: Normocephalic and atraumatic.     Nose: Nose normal.     Mouth/Throat:     Mouth: Mucous membranes are moist.     Pharynx: Oropharynx is clear.  Eyes:     General: Lids are normal.        Right eye: No discharge.        Left eye: No discharge.     Conjunctiva/sclera: Conjunctivae normal.  Neck:     Trachea: No tracheal deviation.  Cardiovascular:     Rate and Rhythm: Normal rate and regular rhythm.     Pulses:          Radial pulses are 1+ on the right side and 1+ on the left side.     Comments: Extremities cool to the touch without pallor or cyanosis -patient and husband state temperature to hands and leg is her normal Pulmonary:     Effort: No respiratory distress.  Breath sounds: No stridor. Rhonchi present.     Comments: Occasional coughing, diminished breath sounds bilaterally at the bases with scattered rhonchi that change with coughing, no tachypnea, accessory muscle use, retractions Abdominal:     General: Abdomen is protuberant. Bowel sounds are normal.     Palpations: Abdomen is soft.     Tenderness: There is no abdominal tenderness. There is no right CVA tenderness, left CVA tenderness, guarding or rebound.  Musculoskeletal:        General: Normal range of motion.  Skin:    General:  Skin is warm and dry.     Findings: No rash.  Neurological:     Mental Status: She is alert and easily aroused. Mental status is at baseline.     Comments: In wheelchair Easily follows commands Bilateral grip strength 5 out of 5  Psychiatric:     Comments: Pleasant, good mood      Results for orders placed or performed during the hospital encounter of 03/18/19  Lipase, blood  Result Value Ref Range   Lipase 35 11 - 51 U/L  Comprehensive metabolic panel  Result Value Ref Range   Sodium 143 135 - 145 mmol/L   Potassium 4.7 3.5 - 5.1 mmol/L   Chloride 103 98 - 111 mmol/L   CO2 26 22 - 32 mmol/L   Glucose, Bld 142 (H) 70 - 99 mg/dL   BUN 30 (H) 8 - 23 mg/dL   Creatinine, Ser 2.11 (H) 0.44 - 1.00 mg/dL   Calcium 9.0 8.9 - 10.3 mg/dL   Total Protein 7.5 6.5 - 8.1 g/dL   Albumin 3.6 3.5 - 5.0 g/dL   AST 16 15 - 41 U/L   ALT 13 0 - 44 U/L   Alkaline Phosphatase 159 (H) 38 - 126 U/L   Total Bilirubin 1.2 0.3 - 1.2 mg/dL   GFR calc non Af Amer 21 (L) >60 mL/min   GFR calc Af Amer 25 (L) >60 mL/min   Anion gap 14 5 - 15  CBC  Result Value Ref Range   WBC 9.8 4.0 - 10.5 K/uL   RBC 4.32 3.87 - 5.11 MIL/uL   Hemoglobin 11.9 (L) 12.0 - 15.0 g/dL   HCT 38.0 36.0 - 46.0 %   MCV 88.0 80.0 - 100.0 fL   MCH 27.5 26.0 - 34.0 pg   MCHC 31.3 30.0 - 36.0 g/dL   RDW 19.0 (H) 11.5 - 15.5 %   Platelets 261 150 - 400 K/uL   nRBC 0.0 0.0 - 0.2 %        Assessment & Plan:      ICD-10-CM   1. Dysuria  R30.0 CBC with Differential/Platelet    cefTRIAXone (ROCEPHIN) injection 1 g    lidocaine (PF) (XYLOCAINE) 1 % injection 2 mL    cephALEXin (KEFLEX) 500 MG capsule TID x 7 d  2. CKD (chronic kidney disease) stage 4, GFR 15-29 ml/min ([facility])  J09.3 COMPLETE METABOLIC PANEL WITH GFR  3. Late onset Alzheimer's disease with behavioral disturbance ([facility])  G30.1    F02.81     Had a long discussion with the patient and her husband regarding her current presentation and with multiple comorbidities,  cancer, history of infections and urinary retention, they were urged to go to the ER if she is not improving very quickly also encouraged to go to the ER if she is too weak to transfer at home, having trouble taking her medications, if she has no urine output.  Patient has been was very adamant about not going to the ER and was very frustrated with past times of had to go with long ER weight visits.  I discussed with him how the current treatment plan is as aggressive as I can be but if it does not work she will likely need fluids further tests and evaluation that we cannot do in clinic and likely would not be able to do very well in urgent care    Delsa Grana, PA-C 06/25/19 2:24 PM

## 2019-06-27 ENCOUNTER — Other Ambulatory Visit: Payer: Self-pay

## 2019-06-27 ENCOUNTER — Other Ambulatory Visit: Payer: Medicare HMO | Admitting: Primary Care

## 2019-06-27 DIAGNOSIS — Z515 Encounter for palliative care: Secondary | ICD-10-CM | POA: Diagnosis not present

## 2019-06-27 NOTE — Progress Notes (Signed)
[facility] Consult Note Telephone: [phone]  Fax: [phone]    PATIENT NAME: Amy [name] [address] [place] [address] [phone] (home)  DOB: [birth year] MRN: [id number]  PRIMARY CARE PROVIDER:   [name], [place], [address] [place] [address] [phone]  REFERRING PROVIDER:  [name], [place],  [place] [address] [phone]  RESPONSIBLE PARTY:   Extended Emergency Contact Information Primary Emergency Contact: [facility] Address: [address]          [place], [place] [address] Johnnette Litter of [place] Phone: [phone] Mobile Phone: [phone] Relation: Spouse Secondary Emergency Contact: [place], [place] Phone: [phone] Mobile Phone: [phone] Relation: Son   ASSESSMENT AND RECOMMENDATIONS:   1. Advance Care Planning/Goals of Care: Goals include to maximize quality of life and symptom management. MOST has been completed with MOST with DNR, limited scope of intervention, use of antibiotics and fluids, no feeding tube.   2. Symptom Management:   Urinary pain: Pain is better. Patient Rx with keflex. She was seen this week for treatment of UTI.  Dyspnea: Has some DOE. Previous smoker. She uses nebulizers about twice a day but often talks during the treatments so they are not maximized. She refuses to use a mask for nebulization.  Epistaxis: Patient has some bleeding but seems related to picking off scabs. We discussed humidifying air in home. She could also benefit from humidified oxygen. They will try a humidifier in the home first.  Nutrition: Eating well, albumin is 3.8. Not treating diabetes currently and a1c is maintaining.   Skin breakdown: On sacrum, has improved with foam dressing.  We discussed her having lasix as the increased urination puts her skin at risk. Her recent potassium was 5.I will reach out to nephrology to  discuss.   3. Family /Caregiver/Community Supports: Lives with husband and has caregiver twice weekly.  4. Cognitive / Functional decline: At baseline. Able to assist to transfer to bear partial weight. Has a prosthesis which has not fit comfortably for a  Long time.  5. Follow up Palliative Care Visit: Palliative care will continue to follow for goals of care clarification and symptom management. Return 4-6 weeks or prn.  I spent 60 minutes providing this consultation,  from 0930 to 1030. More than 50% of the time in this consultation was spent coordinating communication.   HISTORY OF PRESENT ILLNESS:  Amy [name] is a 84 y.o. year old female with multiple medical problems including dementia, DM, left AKA, pressure/incontinence injury.. Palliative Care was asked to follow this patient by consultation request of [name], FNP to help address advance care planning and goals of care. This is a follow up visit.  CODE STATUS:  DNR, limited scope, antibiotic use, limited IV use, no feeding tube.  PPS: 30% HOSPICE ELIGIBILITY/DIAGNOSIS: TBD  PAST MEDICAL HISTORY:  Past Medical History:  Diagnosis Date  . Anemia    low iron  . Anxiety   . Arthritis   . Bilateral cataracts   . Bronchitis    hx of   . Cataract   . Chronic kidney disease   . Depression   . Diabetes mellitus without complication ([facility])   . 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 Emphysema of lung ([facility])   . GERD (gastroesophageal reflux disease)   . Headache   . History of  frequent urinary tract infections   . Hx of renal cell cancer    LEFT  . Hyperlipidemia   . Hypertension   . Numbness and tingling    right hand   . Obesity   . Osteopenia 10/05/2017  . Peripheral artery disease ([facility])   . Urinary frequency   . Urinary incontinence     SOCIAL HX:  Social History   Tobacco Use  . Smoking status: Former Smoker    Packs/day: 1.50    Years: 51.00    Pack  years: 76.50    Types: Cigarettes    Quit date: 05/24/2004    Years since quitting: 15.1  . Smokeless tobacco: Never Used  . Tobacco comment: smoking cessation materials not required  Substance Use Topics  . Alcohol use: Not Currently    ALLERGIES:  Allergies  Allergen Reactions  . Fentanyl Other (See Comments)    urine retention  . Penicillins Itching, Rash and Other (See Comments)    Has patient had a PCN reaction causing immediate rash, facial/tongue/throat swelling, SOB or lightheadedness with hypotension: Yes Has patient had a PCN reaction causing severe rash involving mucus membranes or skin necrosis: No Has patient had a PCN reaction that required hospitalization: No Has patient had a PCN reaction occurring within the last 10 years: Yes If all of the above answers are "NO", then may proceed with Cephalosporin use.     PERTINENT MEDICATIONS:  Outpatient Encounter Medications as of 06/27/2019  Medication Sig  . acetaminophen (TYLENOL) 325 MG tablet Take 325 mg by mouth every 6 (six) hours as needed.  [name] albuterol (VENTOLIN HFA) 108 (90 Base) MCG/ACT inhaler Inhale 1-2 puffs into the lungs every 4 (four) hours as needed.   [name] atorvastatin (LIPITOR) 20 MG tablet Take 1 tablet (20 mg total) by mouth daily at 6 PM.  . busPIRone (BUSPAR) 10 MG tablet Take 10 mg by mouth 3 (three) times daily.  . cephALEXin (KEFLEX) 500 MG capsule Take 1 capsule (500 mg total) by mouth 3 (three) times daily for 7 days.  . clopidogrel (PLAVIX) 75 MG tablet TAKE 1 TABLET EVERY DAY (Patient taking differently: Take 75 mg by mouth daily. )  . donepezil (ARICEPT) 10 MG tablet Take 1 tablet (10 mg total) by mouth at bedtime.  . famotidine (PEPCID) 20 MG tablet Take 1 tablet (20 mg total) by mouth daily.  . ferrous sulfate 325 (65 FE) MG tablet Take 325 mg by mouth daily with breakfast. Pt taking every other day  . FLUoxetine (PROZAC) 10 MG capsule Take 1 capsule (10 mg total) by mouth daily.  [name] gabapentin  (NEURONTIN) 300 MG capsule Take 1 capsule (300 mg total) by mouth 3 (three) times daily.  [name] HYDROcodone-acetaminophen (NORCO) 7.5-325 MG tablet Take 1 tablet by mouth every 8 (eight) hours as needed. For chronic pain.  Okay to fill 1 day early if pharmacy closed on fill date.  [name] ON 07/04/2019] HYDROcodone-acetaminophen (NORCO) 7.5-325 MG tablet Take 1 tablet by mouth every 8 (eight) hours as needed. For chronic pain.  Okay to fill 1 day early pharmacy closed on fill date  . ipratropium-albuterol (DUONEB) 0.5-2.5 (3) MG/3ML SOLN INHALE THE CONTENTS OF 1 VIAL VIA NEBULIZER THREE TIMES DAILY AS DIRECTED  . pantoprazole (PROTONIX) 40 MG tablet Take 1 tablet (40 mg total) by mouth daily.  . QUEtiapine (SEROQUEL) 25 MG tablet TAKE 1 TABLET EVERY MORNING, 1 TABLET EVERY AFTERNOON, AND 2 TABLETS BEFORE BEDTIME.  [name] Thiamine HCl (VITAMIN  B-1) 250 MG tablet Take 250 mg by mouth daily.   No facility-administered encounter medications on file as of 06/27/2019.    PHYSICAL EXAM / ROS:   Current and past weights: unavailable General: NAD, frail appearing, WNWD Cardiovascular: no chest pain reported, no edema in LE, sacrum Pulmonary: no cough, no increased SOB, uses oxygen 2 L [place] Abdomen: appetite fair, deniesconstipation, incontinent of bowel at times GU: denies dysuria, incontinent of urine MSK:  L AKA, Non ambulatory, can bear weight to pivot transfer Skin: buttock incontinence related wound, now one small excoriated area of 0.5 cm round Neurological: Weakness, dementia, denies pain  Jason Coop, NP [facility]  COVID-19 PATIENT SCREENING TOOL  Person answering questions: ____________[name]_______ _____   1.  Is the patient or any family member in the home showing any signs or symptoms regarding respiratory infection?               Person with Symptom- ___________________________  a. Fever                                                                          Yes___ No___           ___________________  b. Shortness of breath                                                    Yes___ No___          ___________________ c. Cough/congestion                                       Yes___  No___         ___________________ d. Body aches/pains                                                         Yes___ No___        ____________________ e. Gastrointestinal symptoms (diarrhea, nausea)           Yes___ No___        ____________________  2. Within the past 14 days, has anyone living in the home had any contact with someone with or under investigation for COVID-19?    Yes___ No_x_   Person __________________

## 2019-07-01 NOTE — Progress Notes (Signed)
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 radiation (completed 04/06/2019).  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 There was slight growth of bilateral pulmonary nodules suspicious for pulmonary metastases.  Discuss plans for repeat chest CT. 4. Lytic lesion of left 8th rib             She received palliative radiation to the 8th rib with 2000 cGy in 5 fractions (04/02/2019 - 04/06/2019).  She denies any pain.  Continue to monitor. 5.   Pulmonary nodules PET scan on 02/28/2019 revealed small pulmonary nodules.             Dr Patsey Berthold notes bronchoscopy high risk.  Rib biopsy was also high risk to biopsy.             Chest CT in 02/2019 noted growth of nodules.  Schedule chest CT on 07/10/2019. 6. B12 deficiencyand folate deficiency B12was197(low) and  folatewas5.5(low) on06/06/2018 ([facility]). [id number] 02/22/2019.B12 goal 400. Folate 27.0on 02/22/2019 was normal.             Patient on oral Y70 and folic acid.             Follow-up B12 level. 7. Iron deficiency Hematocrit 36.0. Hemoglobin 11.6. MCV87.6on 06/25/2019. Ferritin 15 with an iron saturation of 8% and TIBC 344 on 02/22/2019.             She denies bleeding.             Continue oral iron. 8.   Mild confusion  Etiology unclear.  No evidence of infection.  Patient to talk to social work today. 9.   Chest CT without contrast on 07/10/2019. 10.   RTC after chest CT for MD assessment and review of imaging.  I discussed the assessment and treatment plan with the patient.  The patient was provided an opportunity to ask questions and all were answered.  The patient agreed with the plan and demonstrated an understanding of the instructions.  The patient was advised to call back if the symptoms worsen or if the condition fails to improve as anticipated.    Lequita Asal, MD, PhD    07/04/2019, 1:26 PM

## 2019-07-03 ENCOUNTER — Encounter: Payer: Self-pay | Admitting: Hematology and Oncology

## 2019-07-03 ENCOUNTER — Other Ambulatory Visit: Payer: Self-pay

## 2019-07-03 DIAGNOSIS — J449 Chronic obstructive pulmonary disease, unspecified: Secondary | ICD-10-CM | POA: Diagnosis not present

## 2019-07-03 NOTE — Progress Notes (Signed)
Confirmed patient name and DOB for phone assessment prior to her appointment tomorrow. Patient just finished antibiotics for a UTI yesterday. She has received her first COVID vaccine. She has a sore in her nose that she would like looked. Her and her husband would like to know next steps now that radiation is done and more information on the mass on her right side.

## 2019-07-04 ENCOUNTER — Inpatient Hospital Stay: Payer: Medicare HMO | Attending: Hematology and Oncology | Admitting: Hematology and Oncology

## 2019-07-04 VITALS — BP 149/64 | HR 89 | Temp 97.9°F | Resp 18

## 2019-07-04 DIAGNOSIS — Z86718 Personal history of other venous thrombosis and embolism: Secondary | ICD-10-CM | POA: Insufficient documentation

## 2019-07-04 DIAGNOSIS — C7951 Secondary malignant neoplasm of bone: Secondary | ICD-10-CM | POA: Diagnosis not present

## 2019-07-04 DIAGNOSIS — C642 Malignant neoplasm of left kidney, except renal pelvis: Secondary | ICD-10-CM

## 2019-07-04 DIAGNOSIS — Z905 Acquired absence of kidney: Secondary | ICD-10-CM | POA: Insufficient documentation

## 2019-07-04 DIAGNOSIS — R590 Localized enlarged lymph nodes: Secondary | ICD-10-CM | POA: Diagnosis not present

## 2019-07-04 DIAGNOSIS — Z85528 Personal history of other malignant neoplasm of kidney: Secondary | ICD-10-CM | POA: Diagnosis not present

## 2019-07-04 DIAGNOSIS — Z808 Family history of malignant neoplasm of other organs or systems: Secondary | ICD-10-CM | POA: Insufficient documentation

## 2019-07-04 DIAGNOSIS — N189 Chronic kidney disease, unspecified: Secondary | ICD-10-CM | POA: Diagnosis not present

## 2019-07-04 DIAGNOSIS — Z79899 Other long term (current) drug therapy: Secondary | ICD-10-CM | POA: Diagnosis not present

## 2019-07-04 DIAGNOSIS — C162 Malignant neoplasm of body of stomach: Secondary | ICD-10-CM

## 2019-07-04 DIAGNOSIS — Z89612 Acquired absence of left leg above knee: Secondary | ICD-10-CM | POA: Insufficient documentation

## 2019-07-04 DIAGNOSIS — D509 Iron deficiency anemia, unspecified: Secondary | ICD-10-CM | POA: Diagnosis not present

## 2019-07-04 DIAGNOSIS — E538 Deficiency of other specified B group vitamins: Secondary | ICD-10-CM | POA: Diagnosis not present

## 2019-07-04 DIAGNOSIS — Z800 Family history of malignant neoplasm of digestive organs: Secondary | ICD-10-CM | POA: Diagnosis not present

## 2019-07-04 DIAGNOSIS — C169 Malignant neoplasm of stomach, unspecified: Secondary | ICD-10-CM | POA: Insufficient documentation

## 2019-07-04 DIAGNOSIS — M8580 Other specified disorders of bone density and structure, unspecified site: Secondary | ICD-10-CM | POA: Diagnosis not present

## 2019-07-04 DIAGNOSIS — F329 Major depressive disorder, single episode, unspecified: Secondary | ICD-10-CM | POA: Insufficient documentation

## 2019-07-04 DIAGNOSIS — R918 Other nonspecific abnormal finding of lung field: Secondary | ICD-10-CM | POA: Insufficient documentation

## 2019-07-04 DIAGNOSIS — Z833 Family history of diabetes mellitus: Secondary | ICD-10-CM | POA: Diagnosis not present

## 2019-07-04 DIAGNOSIS — F0390 Unspecified dementia without behavioral disturbance: Secondary | ICD-10-CM | POA: Insufficient documentation

## 2019-07-04 DIAGNOSIS — I10 Essential (primary) hypertension: Secondary | ICD-10-CM | POA: Insufficient documentation

## 2019-07-04 DIAGNOSIS — F419 Anxiety disorder, unspecified: Secondary | ICD-10-CM | POA: Insufficient documentation

## 2019-07-04 DIAGNOSIS — E119 Type 2 diabetes mellitus without complications: Secondary | ICD-10-CM | POA: Diagnosis not present

## 2019-07-04 DIAGNOSIS — F17210 Nicotine dependence, cigarettes, uncomplicated: Secondary | ICD-10-CM | POA: Insufficient documentation
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
## 2019-07-11 ENCOUNTER — Other Ambulatory Visit (INDEPENDENT_AMBULATORY_CARE_PROVIDER_SITE_OTHER): Payer: Self-pay | Admitting: Vascular Surgery

## 2019-07-11 DIAGNOSIS — I739 Peripheral vascular disease, unspecified: Secondary | ICD-10-CM

## 2019-07-13 ENCOUNTER — Ambulatory Visit (INDEPENDENT_AMBULATORY_CARE_PROVIDER_SITE_OTHER): Payer: Medicare HMO

## 2019-07-13 ENCOUNTER — Encounter (INDEPENDENT_AMBULATORY_CARE_PROVIDER_SITE_OTHER): Payer: Self-pay | Admitting: Vascular Surgery

## 2019-07-13 ENCOUNTER — Ambulatory Visit (INDEPENDENT_AMBULATORY_CARE_PROVIDER_SITE_OTHER): Payer: Medicare HMO | Admitting: Vascular Surgery

## 2019-07-13 ENCOUNTER — Other Ambulatory Visit: Payer: Self-pay

## 2019-07-13 VITALS — BP 147/86 | HR 92 | Resp 16

## 2019-07-13 DIAGNOSIS — E119 Type 2 diabetes mellitus without complications: Secondary | ICD-10-CM | POA: Diagnosis not present

## 2019-07-13 DIAGNOSIS — C162 Malignant neoplasm of body of stomach: Secondary | ICD-10-CM | POA: Diagnosis not present

## 2019-07-13 DIAGNOSIS — E1122 Type 2 diabetes mellitus with diabetic chronic kidney disease: Secondary | ICD-10-CM | POA: Diagnosis not present

## 2019-07-13 DIAGNOSIS — Z89612 Acquired absence of left leg above knee: Secondary | ICD-10-CM

## 2019-07-13 DIAGNOSIS — I739 Peripheral vascular disease, unspecified: Secondary | ICD-10-CM

## 2019-07-13 DIAGNOSIS — N184 Chronic kidney disease, stage 4 (severe): Secondary | ICD-10-CM | POA: Diagnosis not present

## 2019-07-13 NOTE — Assessment & Plan Note (Signed)
Diagnosed last year

## 2019-07-13 NOTE — Assessment & Plan Note (Signed)
Right ABI today is 0.61 with a digit pressure of 97 and fairly strong monophasic waveforms.  No significant change from her previous studies.  We will continue to follow this on 61-month intervals given her high risk status.  She has a litany of other ongoing issues that appear to be more pressing at this point.

## 2019-07-13 NOTE — Patient Instructions (Signed)
Peripheral Vascular Disease  Peripheral vascular disease (PVD) is a disease of the blood vessels that are not part of your heart and brain. A simple term for PVD is poor circulation. In most cases, PVD narrows the blood vessels that carry blood from your heart to the rest of your body. This can reduce the supply of blood to your arms, legs, and internal organs, like your stomach or kidneys. However, PVD most often affects a person's lower legs and feet. Without treatment, PVD tends to get worse. PVD can also lead to acute ischemic limb. This is when an arm or leg suddenly cannot get enough blood. This is a medical emergency. Follow these instructions at home: Lifestyle  Do not use any products that contain nicotine or tobacco, such as cigarettes and e-cigarettes. If you need help quitting, ask your doctor.  Lose weight if you are overweight. Or, stay at a healthy weight as told by your doctor.  Eat a diet that is low in fat and cholesterol. If you need help, ask your doctor.  Exercise regularly. Ask your doctor for activities that are right for you. General instructions  Take over-the-counter and prescription medicines only as told by your doctor.  Take good care of your feet: ? Wear comfortable shoes that fit well. ? Check your feet often for any cuts or sores.  Keep all follow-up visits as told by your doctor This is important. Contact a doctor if:  You have cramps in your legs when you walk.  You have leg pain when you are at rest.  You have coldness in a leg or foot.  Your skin changes.  You are unable to get or have an erection (erectile dysfunction).  You have cuts or sores on your feet that do not heal. Get help right away if:  Your arm or leg turns cold, numb, and blue.  Your arms or legs become red, warm, swollen, painful, or numb.  You have chest pain.  You have trouble breathing.  You suddenly have weakness in your face, arm, or leg.  You become very  confused or you cannot speak.  You suddenly have a very bad headache.  You suddenly cannot see. Summary  Peripheral vascular disease (PVD) is a disease of the blood vessels.  A simple term for PVD is poor circulation. Without treatment, PVD tends to get worse.  Treatment may include exercise, low fat and low cholesterol diet, and quitting smoking. This information is not intended to replace advice given to you by your health care provider. Make sure you discuss any questions you have with your health care provider. Document Revised: 04/22/2017 Document Reviewed: 06/17/2016 Elsevier Patient Education  2020 [employer].  

## 2019-07-13 NOTE — Progress Notes (Signed)
MRN : [id number]  Amy [name] is a 84 y.o. (1935/11/10) female who presents with chief complaint of  Chief Complaint  Patient presents with  . Follow-up    ultrasound follow up  .  History of Present Illness: Patient returns today in follow up of peripheral arterial disease.  She has over a decade status post left above-knee amputation for severe disease in the left leg.  She has had significant disease in the right leg but has not required intervention in the past several years.  Her long list of medical issues seems to be getting worse.  She is now on oxygen and being treated for gastric cancer.  Her ABIs today are stable at 0.61 on the right with a digit pressure of 97 and fairly strong monophasic waveforms which have not decreased significantly from her previous study 6 months ago.  Current Outpatient Medications  Medication Sig Dispense Refill  . acetaminophen (TYLENOL) 325 MG tablet Take 325 mg by mouth every 6 (six) hours as needed.    [name] albuterol (VENTOLIN HFA) 108 (90 Base) MCG/ACT inhaler Inhale 1-2 puffs into the lungs every 4 (four) hours as needed.     [name] atorvastatin (LIPITOR) 20 MG tablet Take 1 tablet (20 mg total) by mouth daily at 6 PM. 90 tablet 3  . clopidogrel (PLAVIX) 75 MG tablet TAKE 1 TABLET EVERY DAY 90 tablet 3  . Cyanocobalamin (VITAMIN B12 PO) Take by mouth.    . donepezil (ARICEPT) 10 MG tablet Take 1 tablet (10 mg total) by mouth at bedtime. 90 tablet 3  . famotidine (PEPCID) 20 MG tablet Take 1 tablet (20 mg total) by mouth daily. 90 tablet 3  . ferrous sulfate 325 (65 FE) MG tablet Take 325 mg by mouth daily with breakfast. Pt taking every other day    . FLUoxetine (PROZAC) 10 MG capsule Take 1 capsule (10 mg total) by mouth daily. 30 capsule 0  . gabapentin (NEURONTIN) 300 MG capsule Take 1 capsule (300 mg total) by mouth 3 (three) times daily. 270 capsule 3  . ipratropium-albuterol (DUONEB) 0.5-2.5 (3) MG/3ML SOLN INHALE THE CONTENTS OF 1 VIAL VIA  NEBULIZER THREE TIMES DAILY AS DIRECTED 810 mL 2  . pantoprazole (PROTONIX) 40 MG tablet Take 1 tablet (40 mg total) by mouth daily. 90 tablet 1  . QUEtiapine (SEROQUEL) 25 MG tablet TAKE 1 TABLET EVERY MORNING, 1 TABLET EVERY AFTERNOON, AND 2 TABLETS BEFORE BEDTIME. 300 tablet 0  . Thiamine HCl (VITAMIN B-1) 250 MG tablet Take 250 mg by mouth daily.     No current facility-administered medications for this visit.    Past Medical History:  Diagnosis Date  . Anemia    low iron  . Anxiety   . Arthritis   . Bilateral cataracts   . Bronchitis    hx of   . Cataract   . Chronic kidney disease   . Depression   . Diabetes mellitus without complication ([place])   . 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 Emphysema of lung ([place])   . GERD (gastroesophageal reflux disease)   . Headache   . History of frequent urinary tract infections   . Hx of renal cell cancer    LEFT  . Hyperlipidemia   . Hypertension   . Numbness and tingling    right hand   . Obesity   . Osteopenia 10/05/2017  . Peripheral artery  disease ([place])   . Urinary frequency   . Urinary incontinence     Past Surgical History:  Procedure Laterality Date  . ABDOMINAL HYSTERECTOMY  1978  . CARPAL TUNNEL RELEASE Bilateral   . CHOLECYSTECTOMY    . COLONOSCOPY    . COLONOSCOPY WITH ESOPHAGOGASTRODUODENOSCOPY (EGD)    . ERCP N/A 08/19/2017   Procedure: ENDOSCOPIC RETROGRADE CHOLANGIOPANCREATOGRAPHY (ERCP);  Surgeon: Arta Silence, MD;  Location: [facility] ENDOSCOPY;  Service: Endoscopy;  Laterality: N/A;  . ESOPHAGOGASTRODUODENOSCOPY (EGD) WITH PROPOFOL N/A 10/12/2018   Procedure: ESOPHAGOGASTRODUODENOSCOPY (EGD) WITH PROPOFOL;  Surgeon: Virgel Manifold, MD;  Location: [facility] ENDOSCOPY;  Service: Endoscopy;  Laterality: N/A;  . LEG AMPUTATION     above the knee / left   . ROBOT ASSISTED LAPAROSCOPIC NEPHRECTOMY Left 01/15/2015   Procedure: ROBOTIC ASSISTED LAPAROSCOPIC RADICAL  NEPHRECTOMY;  Surgeon: Alexis Frock, MD;  Location: [facility] ORS;  Service: Urology;  Laterality: Left;  . stent placement right leg        Social History   Tobacco Use  . Smoking status: Former Smoker    Packs/day: 1.50    Years: 51.00    Pack years: 76.50    Types: Cigarettes    Quit date: 05/24/2004    Years since quitting: 15.1  . Smokeless tobacco: Never Used  . Tobacco comment: smoking cessation materials not required  Substance Use Topics  . Alcohol use: Not Currently  . Drug use: No    Family History  Problem Relation Age of Onset  . Cancer Mother        Stomach  . Cancer Father   . Diabetes Brother   . Cancer Brother        oral  . Healthy Son   . Healthy Sister     Allergies  Allergen Reactions  . Fentanyl Other (See Comments)    urine retention  . Penicillins Itching, Rash and Other (See Comments)    Has patient had a PCN reaction causing immediate rash, facial/tongue/throat swelling, SOB or lightheadedness with hypotension: Yes Has patient had a PCN reaction causing severe rash involving mucus membranes or skin necrosis: No Has patient had a PCN reaction that required hospitalization: No Has patient had a PCN reaction occurring within the last 10 years: Yes If all of the above answers are "NO", then may proceed with Cephalosporin use.    REVIEW OF SYSTEMS (Negative unless checked)  Constitutional: [] ?Weight loss  [] ?Fever  [] ?Chills Cardiac: [] ?Chest pain   [] ?Chest pressure   [] ?Palpitations   [] ?Shortness of breath when laying flat   [] ?Shortness of breath at rest   [] ?Shortness of breath with exertion. Vascular:  [] ?Pain in legs with walking   [] ?Pain in legs at rest   [] ?Pain in legs when laying flat   [] ?Claudication   [] ?Pain in feet when walking  [] ?Pain in feet at rest  [] ?Pain in feet when laying flat   [] ?History of DVT   [] ?Phlebitis   [] ?Swelling in legs   [] ?Varicose veins   [] ?Non-healing ulcers Pulmonary:   [x] ?Uses home oxygen   [] ?Productive  cough   [] ?Hemoptysis   [] ?Wheeze  [x] ?COPD   [] ?Asthma Neurologic:  [] ?Dizziness  [] ?Blackouts   [] ?Seizures   [] ?History of stroke   [] ?History of TIA  [] ?Aphasia   [] ?Temporary blindness   [] ?Dysphagia   [] ?Weakness or numbness in arms   [] ?Weakness or numbness in legs  X positive for dementia Musculoskeletal:  [x] ?Arthritis   [] ?Joint swelling   [x] ?Joint pain   [] ?Low back  pain Hematologic:  [] ?Easy bruising  [] ?Easy bleeding   [] ?Hypercoagulable state   [] ?Anemic   Gastrointestinal:  [] ?Blood in stool   [] ?Vomiting blood  [x] ?Gastroesophageal reflux/heartburn   [] ?Abdominal pain Genitourinary:  [x] ?Chronic kidney disease   [] ?Difficult urination  [] ?Frequent urination  [] ?Burning with urination   [] ?Hematuria Skin:  [] ?Rashes   [] ?Ulcers   [] ?Wounds Psychological:  [] ?History of anxiety   [] ? History of major depression.  Physical Examination  BP (!) 147/86 (BP Location: Right Arm)   Pulse 92   Resp 16  Gen:  WD/WN, NAD Head: /AT, No temporalis wasting. Ear/Nose/Throat: Hearing grossly intact, nares w/o erythema or drainage Eyes: Conjunctiva clear. Sclera non-icteric Neck: Supple.  Trachea midline Pulmonary:  Good air movement, no use of accessory muscles on supplemental oxygen.  Cardiac: RRR, no JVD Vascular:  Vessel Right Left  Radial Palpable Palpable          DP 1+ NP  PT NP NP   Musculoskeletal: M/S 5/5 throughout.  In a wheelchair. Left AKA.  1+ right lower extremity edema Neurologic: Sensation grossly intact in extremities.  Symmetrical.  Speech is fluent.  Psychiatric: Judgment intact, Mood & affect appropriate for pt's clinical situation. Dermatologic: Sacral ulcer present that is dressed today       Labs Recent Results (from the past 2160 hour(s))  COMPLETE METABOLIC PANEL WITH GFR     Status: Abnormal   Collection Time: 06/25/19  2:46 PM  Result Value Ref Range   Glucose, Bld 126 (H) 65 - 99 mg/dL    Comment: .            Fasting reference  interval . For someone without known diabetes, a glucose value >125 mg/dL indicates that they may have diabetes and this should be confirmed with a follow-up test. .    BUN 35 (H) 7 - 25 mg/dL   Creat 2.15 (H) 0.60 - 0.88 mg/dL    Comment: For patients >63 years of age, the reference limit for Creatinine is approximately 13% higher for people identified as African-American. .    GFR, Est Non African American 21 (L) > OR = 60 mL/min/1.69m2   GFR, Est African American 24 (L) > OR = 60 mL/min/1.64m2   BUN/Creatinine Ratio 16 6 - 22 (calc)   Sodium 144 135 - 146 mmol/L   Potassium 5.0 3.5 - 5.3 mmol/L   Chloride 104 98 - 110 mmol/L   CO2 29 20 - 32 mmol/L   Calcium 9.0 8.6 - 10.4 mg/dL   Total Protein 6.9 6.1 - 8.1 g/dL   Albumin 3.8 3.6 - 5.1 g/dL   Globulin 3.1 1.9 - 3.7 g/dL (calc)   AG Ratio 1.2 1.0 - 2.5 (calc)   Total Bilirubin 1.0 0.2 - 1.2 mg/dL   Alkaline phosphatase (APISO) 211 (H) 37 - 153 U/L   AST 14 10 - 35 U/L   ALT 10 6 - 29 U/L  CBC with Differential/Platelet     Status: Abnormal   Collection Time: 06/25/19  2:46 PM  Result Value Ref Range   WBC 9.5 3.8 - 10.8 Thousand/uL   RBC 4.11 3.80 - 5.10 Million/uL   Hemoglobin 11.6 (L) 11.7 - 15.5 g/dL   HCT 36.0 35.0 - 45.0 %   MCV 87.6 80.0 - 100.0 fL   MCH 28.2 27.0 - 33.0 pg   MCHC 32.2 32.0 - 36.0 g/dL   RDW 15.1 (H) 11.0 - 15.0 %   Platelets 237 140 - 400 Thousand/uL  MPV 11.0 7.5 - 12.5 fL   Neutro Abs 6,916 1,500 - 7,800 cells/uL   Lymphs Abs 1,169 850 - 3,900 cells/uL   Absolute Monocytes 760 200 - 950 cells/uL   Eosinophils Absolute 599 (H) 15 - 500 cells/uL   Basophils Absolute 57 0 - 200 cells/uL   Neutrophils Relative % 72.8 %   Total Lymphocyte 12.3 %   Monocytes Relative 8.0 %   Eosinophils Relative 6.3 %   Basophils Relative 0.6 %    Radiology No results found.  Assessment/Plan Diabetes mellitus type 2, controlled blood glucose control important in reducing the progression of  atherosclerotic disease. Also, involved in wound healing. On appropriate medications.   BP (high blood pressure) blood pressure control important in reducing the progression of atherosclerotic disease. On appropriate oral medications.   S/P AKA (above knee amputation) unilateral, left ([place]) About 13years ago. Stable and well healed. With her cognitive decline she is no longer able to use her prosthesis.  Gastric cancer ([place]) Diagnosed last year  PVD (peripheral vascular disease) ([place]) Right ABI today is 0.61 with a digit pressure of 97 and fairly strong monophasic waveforms.  No significant change from her previous studies.  We will continue to follow this on 66-month intervals given her high risk status.  She has a litany of other ongoing issues that appear to be more pressing at this point.    Leotis Pain, MD  07/13/2019 11:36 AM    This note was created with Dragon medical transcription system.  Any errors from dictation are purely unintentional

## 2019-07-15 DIAGNOSIS — I129 Hypertensive chronic kidney disease with stage 1 through stage 4 chronic kidney disease, or unspecified chronic kidney disease: Secondary | ICD-10-CM | POA: Insufficient documentation

## 2019-07-15 DIAGNOSIS — E1122 Type 2 diabetes mellitus with diabetic chronic kidney disease: Secondary | ICD-10-CM | POA: Insufficient documentation

## 2019-07-15 DIAGNOSIS — N1832 Chronic kidney disease, stage 3b: Secondary | ICD-10-CM | POA: Insufficient documentation

## 2019-07-15 DIAGNOSIS — D631 Anemia in chronic kidney disease: Secondary | ICD-10-CM | POA: Insufficient documentation

## 2019-07-23 ENCOUNTER — Encounter: Payer: Self-pay | Admitting: Student in an Organized Health Care Education/Training Program

## 2019-07-23 ENCOUNTER — Telehealth: Payer: Self-pay

## 2019-07-24 ENCOUNTER — Ambulatory Visit
Payer: Medicare HMO | Attending: Student in an Organized Health Care Education/Training Program | Admitting: Student in an Organized Health Care Education/Training Program

## 2019-07-24 ENCOUNTER — Other Ambulatory Visit: Payer: Self-pay

## 2019-07-24 ENCOUNTER — Encounter: Payer: Self-pay | Admitting: Student in an Organized Health Care Education/Training Program

## 2019-07-24 DIAGNOSIS — G894 Chronic pain syndrome: Secondary | ICD-10-CM

## 2019-07-24 DIAGNOSIS — G546 Phantom limb syndrome with pain: Secondary | ICD-10-CM

## 2019-07-24 DIAGNOSIS — Z89612 Acquired absence of left leg above knee: Secondary | ICD-10-CM

## 2019-07-24 MED ORDER — HYDROCODONE-ACETAMINOPHEN 7.5-325 MG PO TABS: 1.0000 | ORAL_TABLET | Freq: Three times a day (TID) | ORAL | 0 refills | Status: AC | PRN

## 2019-07-24 MED ORDER — GABAPENTIN 300 MG PO CAPS: 300.0000 mg | ORAL_CAPSULE | Freq: Three times a day (TID) | ORAL | 3 refills | Status: AC

## 2019-07-24 MED ORDER — HYDROCODONE-ACETAMINOPHEN 7.5-325 MG PO TABS: 1.0000 | ORAL_TABLET | Freq: Three times a day (TID) | ORAL | 0 refills | Status: DC | PRN

## 2019-07-24 NOTE — Progress Notes (Signed)
Patient: Amy [name]  Service Category: E/M  Provider: Gillis Santa, MD  DOB: [birth year]  DOS: 07/24/2019  Location: Office  MRN: [id number]  Setting: Ambulatory outpatient  Referring Provider: [name], FNP  Type: Established Patient  Specialty: Interventional Pain Management  PCP: [name], FNP  Location: Home  Delivery: TeleHealth     Virtual Encounter - Pain Management PROVIDER NOTE: Information contained herein reflects review and annotations entered in association with encounter. Interpretation of such information and data should be left to medically-trained personnel. Information provided to patient can be located elsewhere in the medical record under "Patient Instructions". Document created using STT-dictation technology, any transcriptional errors that may result from process are unintentional.    Contact & Pharmacy Preferred: [phone] Home: [phone] (home) Mobile: [phone] (mobile) E-mail: [email or web address]  [facility] - [place], [place] [address] Phone: [phone] Fax: [phone]  [facility] - [place], [place] [address] Phone: [phone] Fax: [phone]   Pre-screening  Amy [name] offered "in-person" vs "virtual" encounter. She indicated preferring virtual for this encounter.   Reason COVID-19*  Social distancing based on CDC and AMA recommendations.   I contacted Amy [name] on 07/24/2019 via telephone.      I clearly identified myself as Gillis Santa, MD. I verified that I was speaking with the correct person using two identifiers (Name: Amy [name], and date of birth: 02-11-1936).  This visit was completed via telephone due to the restrictions of the COVID-19 pandemic. All issues as above were discussed and addressed but no physical exam was performed. If it was felt that the patient should be  evaluated in the office, they were directed there. The patient verbally consented to this visit. Patient was unable to complete an audio/visual visit due to Technical difficulties and/or Lack of internet. Due to the catastrophic nature of the COVID-19 pandemic, this visit was done through audio contact only.  Location of the patient: home address (see Epic for details)  Location of the provider: office  Consent I sought verbal advanced consent from Amy [name] for virtual visit interactions. I informed Amy [name] of possible security and privacy concerns, risks, and limitations associated with providing "not-in-person" medical evaluation and management services. I also informed Amy [name] of the availability of "in-person" appointments. Finally, I informed her that there would be a charge for the virtual visit and that she could be  personally, fully or partially, financially responsible for it. Amy [name] expressed understanding and agreed to proceed.   Historic Elements   Amy [name] is a 84 y.o. year old, female patient evaluated today after her last contact with our practice on 07/23/2019. Amy [name]  has a past medical history of Anemia, Anxiety, Arthritis, Bilateral cataracts, Bronchitis, Cataract, Chronic kidney disease, Depression, Diabetes mellitus without complication ([place]), DVT (deep venous thrombosis) ([place]), Emphysema of lung ([place]), GERD (gastroesophageal reflux disease), Headache, History of frequent urinary tract infections, renal cell cancer, Hyperlipidemia, Hypertension, Numbness and tingling, Obesity, Osteopenia (10/05/2017), Peripheral artery disease ([place]), Urinary frequency, and Urinary incontinence. She also  has a past surgical history that includes Leg amputation; Abdominal hysterectomy (1978); Cholecystectomy; Carpal tunnel release (Bilateral); stent placement right leg ; Robot assisted laparoscopic nephrectomy (Left, 01/15/2015); Colonoscopy; Colonoscopy with  esophagogastroduodenoscopy (egd); ERCP (N/A, 08/19/2017); and Esophagogastroduodenoscopy (egd) with propofol (N/A, 10/12/2018). Amy [name] has a current medication list which includes  the following prescription(s): acetaminophen, albuterol, atorvastatin, clopidogrel, cyanocobalamin, donepezil, famotidine, ferrous sulfate, fluoxetine, gabapentin, [START ON 07/27/2019] hydrocodone-acetaminophen, [START ON 08/26/2019] hydrocodone-acetaminophen, [START ON 09/25/2019] hydrocodone-acetaminophen, ipratropium-albuterol, pantoprazole, quetiapine, and vitamin b-1. She  reports that she quit smoking about 15 years ago. Her smoking use included cigarettes. She has a 76.50 pack-year smoking history. She has never used smokeless tobacco. She reports previous alcohol use. She reports that she does not use drugs. Amy [name] is allergic to fentanyl and penicillins.   HPI  Today, she is being contacted for medication management.   No change in medical history since last visit.  Patient's pain is at baseline.  Patient continues multimodal pain regimen as prescribed.  States that it provides pain relief and improvement in functional status.   Pharmacotherapy Assessment  Analgesic:  07/04/2019  1   04/25/2019  Hydrocodone-Acetamin 7.5-325  90.00  30 Bi Lat   [id number]   Thr (4878)   0  22.50 MME  Medicare   [place]     Monitoring: [place] PMP: PDMP reviewed during this encounter.       Pharmacotherapy: No side-effects or adverse reactions reported. Compliance: No problems identified. Effectiveness: Clinically acceptable. Plan: Refer to "POC".  UDS: No results found for: SUMMARY Laboratory Chemistry Profile   Renal Lab Results  Component Value Date   BUN 35 (H) 06/25/2019   CREATININE 2.15 (H) 06/25/2019   LABCREA 111 02/03/2018   BCR 16 06/25/2019   GFRAA 24 (L) 06/25/2019   GFRNONAA 21 (L) 06/25/2019    Hepatic Lab Results  Component Value Date   AST 14 06/25/2019   ALT 10 06/25/2019   ALBUMIN 3.6 03/18/2019    ALKPHOS 159 (H) 03/18/2019   LIPASE 35 03/18/2019    Electrolytes Lab Results  Component Value Date   NA 144 06/25/2019   K 5.0 06/25/2019   CL 104 06/25/2019   CALCIUM 9.0 06/25/2019   MG 1.9 07/02/2018    Bone Lab Results  Component Value Date   VD25OH 17 (L) 02/03/2018    Inflammation (CRP: Acute Phase) (ESR: Chronic Phase) Lab Results  Component Value Date   ESRSEDRATE 19 01/27/2017      Note: Above Lab results reviewed.  Imaging  VAS [place] ABI WITH/WO TBI LOWER EXTREMITY DOPPLER STUDY  Indications: Peripheral artery disease.   Vascular Interventions: Lt AKA, Bilat CIA stents 2007.  Comparison Study: 01/02/2019  Performing Technologist: Almira Coaster RVS    Examination Guidelines: A complete evaluation includes at minimum, Doppler waveform signals and systolic blood pressure reading at the level of bilateral brachial, anterior tibial, and posterior tibial arteries, when vessel segments are accessible. Bilateral testing is considered an integral part of a complete examination. Photoelectric Plethysmograph (PPG) waveforms and toe systolic pressure readings are included as required and additional duplex testing as needed. Limited examinations for reoccurring indications may be performed as noted.    ABI Findings: +---------+------------------+-----+----------+--------+ Right    Rt Pressure (mmHg)IndexWaveform  Comment  +---------+------------------+-----+----------+--------+ Brachial 154                                       +---------+------------------+-----+----------+--------+ ATA      91                0.59 monophasic         +---------+------------------+-----+----------+--------+ PTA      94  0.61 monophasic         +---------+------------------+-----+----------+--------+ Great Toe97                0.63 Abnormal            +---------+------------------+-----+----------+--------+  +--------+------------------+-----+--------+-------+ Left    Lt Pressure (mmHg)IndexWaveformComment +--------+------------------+-----+--------+-------+ [id number]                                    +--------+------------------+-----+--------+-------+ ATA                                    AKA     +--------+------------------+-----+--------+-------+ PTA                                    AKA     +--------+------------------+-----+--------+-------+  +-------+-----------+-----------+------------+------------+ ABI/TBIToday's ABIToday's TBIPrevious ABIPrevious TBI +-------+-----------+-----------+------------+------------+ Right  .61        .63        .64         .50          +-------+-----------+-----------+------------+------------+  Right ABIs appear essentially unchanged compared to prior study on 01/02/2019. Right TBIs appear increased compared to prior study on 01/02/2019.   Summary: Right: Resting right ankle-brachial index indicates moderate right lower extremity arterial disease. The right toe-brachial index is abnormal.  Left:  Lt AKA.   *See table(s) above for measurements and observations.    Electronically signed by Leotis Pain MD on 07/13/2019 at 11:46:52 AM.       Final    Assessment  The primary encounter diagnosis was Chronic pain syndrome. Diagnoses of Phantom limb syndrome with pain (HCC) and S/P AKA (above knee amputation) unilateral, left ([place]) were also pertinent to this visit.  Plan of Care   Amy [name] has a current medication list which includes the following long-term medication(s): atorvastatin, donepezil, famotidine, fluoxetine, gabapentin, ipratropium-albuterol, pantoprazole, and quetiapine.  Pharmacotherapy (Medications Ordered): Meds ordered this encounter  Medications  . HYDROcodone-acetaminophen (NORCO) 7.5-325 MG tablet    Sig: Take 1  tablet by mouth every 8 (eight) hours as needed for moderate pain.    Dispense:  90 tablet    Refill:  0  . HYDROcodone-acetaminophen (NORCO) 7.5-325 MG tablet    Sig: Take 1 tablet by mouth every 8 (eight) hours as needed for moderate pain.    Dispense:  90 tablet    Refill:  0  . HYDROcodone-acetaminophen (NORCO) 7.5-325 MG tablet    Sig: Take 1 tablet by mouth every 8 (eight) hours as needed for moderate pain.    Dispense:  90 tablet    Refill:  0  . gabapentin (NEURONTIN) 300 MG capsule    Sig: Take 1 capsule (300 mg total) by mouth 3 (three) times daily.    Dispense:  270 capsule    Refill:  3   Follow-up plan:   Return in about 3 months (around 10/24/2019) for Medication Management.    Recent Visits Date Type Provider Dept  04/25/19 Office Visit Gillis Santa, MD [facility]  Showing recent visits within past 90 days and meeting all other requirements   Today's Visits Date Type Provider Dept  07/24/19 Office Visit Gillis Santa, MD [facility]  Showing today's visits and meeting all other requirements  Future Appointments No visits were found meeting these conditions.  Showing future appointments within next 90 days and meeting all other requirements   I discussed the assessment and treatment plan with the patient. The patient was provided an opportunity to ask questions and all were answered. The patient agreed with the plan and demonstrated an understanding of the instructions.  Patient advised to call back or seek an in-person evaluation if the symptoms or condition worsens.  Duration of encounter: 25 minutes.  Note by: Gillis Santa, MD Date: 07/24/2019; Time: 8:49 AM

## 2019-07-27 DIAGNOSIS — R809 Proteinuria, unspecified: Secondary | ICD-10-CM | POA: Diagnosis not present

## 2019-07-27 DIAGNOSIS — I129 Hypertensive chronic kidney disease with stage 1 through stage 4 chronic kidney disease, or unspecified chronic kidney disease: Secondary | ICD-10-CM | POA: Diagnosis not present

## 2019-07-27 DIAGNOSIS — N1832 Chronic kidney disease, stage 3b: Secondary | ICD-10-CM | POA: Diagnosis not present

## 2019-07-27 DIAGNOSIS — D631 Anemia in chronic kidney disease: Secondary | ICD-10-CM | POA: Diagnosis not present

## 2019-07-27 DIAGNOSIS — N2581 Secondary hyperparathyroidism of renal origin: Secondary | ICD-10-CM | POA: Diagnosis not present

## 2019-07-27 DIAGNOSIS — E1122 Type 2 diabetes mellitus with diabetic chronic kidney disease: Secondary | ICD-10-CM | POA: Diagnosis not present

## 2019-07-31 ENCOUNTER — Other Ambulatory Visit: Payer: Self-pay

## 2019-07-31 ENCOUNTER — Other Ambulatory Visit: Payer: Medicare HMO | Admitting: Primary Care

## 2019-07-31 DIAGNOSIS — Z515 Encounter for palliative care: Secondary | ICD-10-CM

## 2019-07-31 DIAGNOSIS — J449 Chronic obstructive pulmonary disease, unspecified: Secondary | ICD-10-CM | POA: Diagnosis not present

## 2019-07-31 NOTE — Progress Notes (Signed)
[facility] Consult Note Telephone: [phone]  Fax: [phone]    PATIENT NAME: Amy [name] [address] [place] [address] [phone] (home)  DOB: 06/20/1935 MRN: [id number]  PRIMARY CARE PROVIDER:   [name], [place], [address] [place] [address] [phone]  REFERRING PROVIDER:  [name], [place],  [place] [address] [phone]  RESPONSIBLE PARTY:   Extended Emergency Contact Information Primary Emergency Contact: Murray,[name] Address: [address]          [place], [place] [address] Johnnette Litter of [place] Phone: [phone] Mobile Phone: [phone] Relation: Spouse Secondary Emergency Contact: [employer], [place] Phone: [phone] Mobile Phone: [phone] Relation: Son  I met in pt home with her, her husband and caregiver.   ASSESSMENT AND RECOMMENDATIONS:   1. Advance Care Planning/Goals of Care: Goals include to maximize quality of life and symptom management.Has ACP on file, MOSTwithDNR,limited scopeof intervention, use of antibiotics and fluids, no feeding tube. We discussed her disease process as dementia is worse. We discussed her cancer spreading and quality of life. Husband voices extensive strain but interested in hiring night time sitter.   2. Symptom Management:   Skin integrity: Stopped using foam dressing, due to discomfort. Not using zinc b/c husband thinks it looks better without.Spending more time in chair on sacrum, we discussed monitoring for breakdown.  Insomnia. Having night awakenings and wanting to get oob at 2 am. Husband describes caregiver strain from lack of sleep. Also taking melatonin 10 mg ER but does not seem to change this pattern. May be dementia related.   SOB: does nebulizer sporadically. Does not inhale deeply and refuses face mask.  Uses oxygen ATC. Does not appear dypsnic.  Falls: Has threatened to get  up alone at night and fell one night. Husband states she says she wants go home and may be trying to do that.   Dementia: Has states she is being held here against her will, that she does not live here and she wants to go home to her childhood home. Husband states short term memory is getting worse and does not remember things for long. Taking Seroquel 75 mg at hs, and 25 mg at 2 pm. Discussed dementia and interactions RE her delusions. She may be hallucinating. Husband is frustrated with her mentation. We discussed "going along" vs arguing with her delusions.   Nephrology: Saw MD this week, and he said she could quit the lasix. Advocated to do this due to incontinence burden.  3. Family /Caregiver/Community Supports: Lives in home with husband, who is main caregiver. Has hired personal assistance  two days a week.   4. Cognitive / Functional decline: increased confusion and hallucination. Min. Able to do purposeful adls.   5. Follow up Palliative Care Visit: Palliative care will continue to follow for goals of care clarification and symptom management. Return 4-6 weeks or prn.  I spent 60 minutes providing this consultation,  from 1030 to 1130. More than 50% of the time in this consultation was spent coordinating communication.   HISTORY OF PRESENT ILLNESS:  Amy [name] is a 84 y.o. year old female with multiple medical problems including dementia, DM, left AKA, pressure/incontinence injury. Palliative Care was asked to follow this patient by consultation request of [name], FNP to help address advance care planning and goals of care. This is a follow up visit.  CODE STATUS: DNR, limited scope, antibiotic use,  limited IV use, no feeding tube.  PPS: 30% HOSPICE ELIGIBILITY/DIAGNOSIS: TBD  PAST MEDICAL HISTORY:  Past Medical History:  Diagnosis Date  . Anemia    low iron  . Anxiety   . Arthritis   . Bilateral cataracts   . Bronchitis    hx of   . Cataract   . Chronic  kidney disease   . Depression   . Diabetes mellitus without complication ([place])   . 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 Emphysema of lung ([place])   . GERD (gastroesophageal reflux disease)   . Headache   . History of frequent urinary tract infections   . Hx of renal cell cancer    LEFT  . Hyperlipidemia   . Hypertension   . Numbness and tingling    right hand   . Obesity   . Osteopenia 10/05/2017  . Peripheral artery disease ([place])   . Urinary frequency   . Urinary incontinence     SOCIAL HX:  Social History   Tobacco Use  . Smoking status: Former Smoker    Packs/day: 1.50    Years: 51.00    Pack years: 76.50    Types: Cigarettes    Quit date: 05/24/2004    Years since quitting: 15.1  . Smokeless tobacco: Never Used  . Tobacco comment: smoking cessation materials not required  Substance Use Topics  . Alcohol use: Not Currently    ALLERGIES:  Allergies  Allergen Reactions  . Fentanyl Other (See Comments)    urine retention  . Penicillins Itching, Rash and Other (See Comments)    Has patient had a PCN reaction causing immediate rash, facial/tongue/throat swelling, SOB or lightheadedness with hypotension: Yes Has patient had a PCN reaction causing severe rash involving mucus membranes or skin necrosis: No Has patient had a PCN reaction that required hospitalization: No Has patient had a PCN reaction occurring within the last 10 years: Yes If all of the above answers are "NO", then may proceed with Cephalosporin use.     PERTINENT MEDICATIONS:  Outpatient Encounter Medications as of 07/31/2019  Medication Sig  . acetaminophen (TYLENOL) 325 MG tablet Take 325 mg by mouth every 6 (six) hours as needed.  [name] albuterol (VENTOLIN HFA) 108 (90 Base) MCG/ACT inhaler Inhale 1-2 puffs into the lungs every 4 (four) hours as needed.   [name] atorvastatin (LIPITOR) 20 MG tablet Take 1 tablet (20 mg total) by mouth daily at 6 PM.  . clopidogrel  (PLAVIX) 75 MG tablet TAKE 1 TABLET EVERY DAY  . Cyanocobalamin (VITAMIN B12 PO) Take by mouth.  . donepezil (ARICEPT) 10 MG tablet Take 1 tablet (10 mg total) by mouth at bedtime.  . famotidine (PEPCID) 20 MG tablet Take 1 tablet (20 mg total) by mouth daily.  . ferrous sulfate 325 (65 FE) MG tablet Take 325 mg by mouth daily with breakfast. Pt taking every other day  . FLUoxetine (PROZAC) 10 MG capsule Take 1 capsule (10 mg total) by mouth daily.  [name] gabapentin (NEURONTIN) 300 MG capsule Take 1 capsule (300 mg total) by mouth 3 (three) times daily.  [name] HYDROcodone-acetaminophen (NORCO) 7.5-325 MG tablet Take 1 tablet by mouth every 8 (eight) hours as needed for moderate pain.  [name] ON 08/26/2019] HYDROcodone-acetaminophen (NORCO) 7.5-325 MG tablet Take 1 tablet by mouth every 8 (eight) hours as needed for moderate pain.  [name] ON 09/25/2019] HYDROcodone-acetaminophen (NORCO) 7.5-325 MG tablet  Take 1 tablet by mouth every 8 (eight) hours as needed for moderate pain.  [name] ipratropium-albuterol (DUONEB) 0.5-2.5 (3) MG/3ML SOLN INHALE THE CONTENTS OF 1 VIAL VIA NEBULIZER THREE TIMES DAILY AS DIRECTED  . pantoprazole (PROTONIX) 40 MG tablet Take 1 tablet (40 mg total) by mouth daily.  . QUEtiapine (SEROQUEL) 25 MG tablet TAKE 1 TABLET EVERY MORNING, 1 TABLET EVERY AFTERNOON, AND 2 TABLETS BEFORE BEDTIME.  [name] Thiamine HCl (VITAMIN B-1) 250 MG tablet Take 250 mg by mouth daily.   No facility-administered encounter medications on file as of 07/31/2019.    PHYSICAL EXAM / ROS:   Current and past weights: unavailable General: NAD, frail appearing, obese Cardiovascular: no chest pain reported, no edema on R LE Pulmonary: no cough, no increased SOB, oxygen in place Abdomen: appetite fair, endorses occ constipation, incontinent of bowel GU: denies dysuria, incontinent of urine MSK:  no joint deformities, Non ambulatory, L AKA, pivot transfer Skin:  Small open areas on sacrum, from incontinence, not using  foam dressing now due to discomfort, not using any creams Neurological: Weakness, insomnia,  Dementia stage 7 C, hallucinations +  Jason Coop, NP [facility]  COVID-19 PATIENT SCREENING TOOL  Person answering questions: ____________Ray_______ _____   1.  Is the patient or any family member in the home showing any signs or symptoms regarding respiratory infection?               Person with Symptom- __________NA_________________  a. Fever                                                                          Yes___ No___          ___________________  b. Shortness of breath                                                    Yes___ No___          ___________________ c. Cough/congestion                                       Yes___  No___         ___________________ d. Body aches/pains                                                         Yes___ No___        ____________________ e. Gastrointestinal symptoms (diarrhea, nausea)           Yes___ No___        ____________________  2. Within the past 14 days, has anyone living in the home had any contact with someone with or under investigation for COVID-19?    Yes___ No_X_   Person __________________

## 2019-08-08 ENCOUNTER — Emergency Department
Admission: EM | Admit: 2019-08-08 | Discharge: 2019-08-08 | Disposition: A | Discharge status: Home / Self Care | Payer: Medicare HMO | Attending: Emergency Medicine | Admitting: Emergency Medicine

## 2019-08-08 ENCOUNTER — Emergency Department: Payer: Medicare HMO

## 2019-08-08 ENCOUNTER — Other Ambulatory Visit: Payer: Self-pay

## 2019-08-08 ENCOUNTER — Ambulatory Visit: Payer: Self-pay | Admitting: Family Medicine

## 2019-08-08 DIAGNOSIS — Z87891 Personal history of nicotine dependence: Secondary | ICD-10-CM | POA: Insufficient documentation

## 2019-08-08 DIAGNOSIS — Z7902 Long term (current) use of antithrombotics/antiplatelets: Secondary | ICD-10-CM | POA: Insufficient documentation

## 2019-08-08 DIAGNOSIS — J449 Chronic obstructive pulmonary disease, unspecified: Secondary | ICD-10-CM | POA: Diagnosis not present

## 2019-08-08 DIAGNOSIS — N184 Chronic kidney disease, stage 4 (severe): Secondary | ICD-10-CM | POA: Diagnosis not present

## 2019-08-08 DIAGNOSIS — M79605 Pain in left leg: Secondary | ICD-10-CM | POA: Diagnosis present

## 2019-08-08 DIAGNOSIS — G301 Alzheimer's disease with late onset: Secondary | ICD-10-CM | POA: Insufficient documentation

## 2019-08-08 DIAGNOSIS — Z79899 Other long term (current) drug therapy: Secondary | ICD-10-CM | POA: Diagnosis not present

## 2019-08-08 DIAGNOSIS — E1122 Type 2 diabetes mellitus with diabetic chronic kidney disease: Secondary | ICD-10-CM | POA: Diagnosis not present

## 2019-08-08 DIAGNOSIS — Z8579 Personal history of other malignant neoplasms of lymphoid, hematopoietic and related tissues: Secondary | ICD-10-CM | POA: Diagnosis not present

## 2019-08-08 DIAGNOSIS — Z9049 Acquired absence of other specified parts of digestive tract: Secondary | ICD-10-CM | POA: Insufficient documentation

## 2019-08-08 DIAGNOSIS — I129 Hypertensive chronic kidney disease with stage 1 through stage 4 chronic kidney disease, or unspecified chronic kidney disease: Secondary | ICD-10-CM | POA: Insufficient documentation

## 2019-08-08 DIAGNOSIS — G546 Phantom limb syndrome with pain: Secondary | ICD-10-CM | POA: Insufficient documentation

## 2019-08-08 DIAGNOSIS — Z85528 Personal history of other malignant neoplasm of kidney: Secondary | ICD-10-CM | POA: Diagnosis not present

## 2019-08-08 DIAGNOSIS — M25552 Pain in left hip: Secondary | ICD-10-CM | POA: Diagnosis not present

## 2019-08-08 DIAGNOSIS — Z85830 Personal history of malignant neoplasm of bone: Secondary | ICD-10-CM | POA: Diagnosis not present

## 2019-08-08 LAB — BASIC METABOLIC PANEL
Anion gap: 8 (ref 5–15)
BUN: 22 mg/dL (ref 8–23)
CO2: 26 mmol/L (ref 22–32)
Calcium: 8.4 mg/dL — ABNORMAL LOW (ref 8.9–10.3)
Chloride: 106 mmol/L (ref 98–111)
Creatinine, Ser: 1.67 mg/dL — ABNORMAL HIGH (ref 0.44–1.00)
GFR calc Af Amer: 33 mL/min — ABNORMAL LOW (ref >60)
GFR calc non Af Amer: 28 mL/min — ABNORMAL LOW (ref >60)
Glucose, Bld: 103 mg/dL — ABNORMAL HIGH (ref 70–99)
Potassium: 5.1 mmol/L (ref 3.5–5.1)
Sodium: 140 mmol/L (ref 135–145)

## 2019-08-08 LAB — CBC WITH DIFFERENTIAL/PLATELET
Abs Immature Granulocytes: 0.04 10*3/uL (ref 0.00–0.07)
Basophils Absolute: 0.1 10*3/uL (ref 0.0–0.1)
Basophils Relative: 1 %
Eosinophils Absolute: 0.5 10*3/uL (ref 0.0–0.5)
Eosinophils Relative: 6 %
HCT: 34.3 % — ABNORMAL LOW (ref 36.0–46.0)
Hemoglobin: 10.9 g/dL — ABNORMAL LOW (ref 12.0–15.0)
Immature Granulocytes: 1 %
Lymphocytes Relative: 17 %
Lymphs Abs: 1.4 10*3/uL (ref 0.7–4.0)
MCH: 27.7 pg (ref 26.0–34.0)
MCHC: 31.8 g/dL (ref 30.0–36.0)
MCV: 87.3 fL (ref 80.0–100.0)
Monocytes Absolute: 0.7 10*3/uL (ref 0.1–1.0)
Monocytes Relative: 8 %
Neutro Abs: 5.7 10*3/uL (ref 1.7–7.7)
Neutrophils Relative %: 67 %
Platelets: 236 10*3/uL (ref 150–400)
RBC: 3.93 MIL/uL (ref 3.87–5.11)
RDW: 15.8 % — ABNORMAL HIGH (ref 11.5–15.5)
WBC: 8.4 10*3/uL (ref 4.0–10.5)
nRBC: 0 % (ref 0.0–0.2)

## 2019-08-08 MED ORDER — HYDROMORPHONE HCL 1 MG/ML IJ SOLN
0.5000 mg | Freq: Once | INTRAMUSCULAR | Status: AC
  Administered 2019-08-08: 0.5 mg via INTRAMUSCULAR
  Filled 2019-08-08: qty 1

## 2019-08-08 NOTE — Discharge Instructions (Addendum)
Discussed your phantom limb syndrome with pain management.

## 2019-08-08 NOTE — ED Triage Notes (Signed)
Pt comes POV with phantom leg pain. Amputation was 2007. Pt has had this pain before but it's worse today.

## 2019-08-08 NOTE — ED Notes (Signed)
Pt transported to xray 

## 2019-08-08 NOTE — ED Notes (Signed)
Pt refusing to see PA. Pt stating she wants to see an MD because she knows it's not phantom leg pain. Charge nurse made aware. MD notified.

## 2019-08-08 NOTE — Telephone Encounter (Signed)
Pt's husband initiating call, pt present. Husband reports pt has been yelling out in pain. Pain onset 0630. Pt has left AKA, 2007. Husband states she has "SOme phantom pain at times but nothing like this." NT can hear pt moaning, yelling out in background. Spoke to pt, states pain may be phantom but "Feels like the rest of my leg." Reports leg "Hot, red." Denied any injury. Pt is on Gabapentin 300mg  3x's daily, no missed doses. States has also taken Norco this am, ineffective. Reports pain was intermittent this AM, now constant 10/10.  Pt directed to ED. Husband and pt state will follow disposition.   Reason for Disposition . [1] SEVERE pain (e.g., excruciating, unable to do any normal activities) AND [2] not improved after 2 hours of pain medicine  Answer Assessment - Initial Assessment Questions 1. ONSET: "When did the pain start?"      0630 this AM 2. LOCATION: "Where is the pain located?"      Left leg, pt left AKA 3. PAIN: "How bad is the pain?"    (Scale 1-10; or mild, moderate, severe)   -  MILD (1-3): doesn't interfere with normal activities    -  MODERATE (4-7): interferes with normal activities (e.g., work or school) or awakens from sleep, limping    -  SEVERE (8-10): excruciating pain, unable to do any normal activities, unable to walk     10/10 4. WORK OR EXERCISE: "Has there been any recent work or exercise that involved this part of the body?"      no 5. CAUSE: "What do you think is causing the leg pain?"     "Some phantom pain but mostly leg" 6. OTHER SYMPTOMS: "Do you have any other symptoms?" (e.g., chest pain, back pain, breathing difficulty, swelling, rash, fever, numbness, weakness)     Leg "Hot" red  Protocols used: LEG PAIN-A-AH

## 2019-08-08 NOTE — ED Provider Notes (Signed)
[facility] Emergency Department Provider Note   ____________________________________________   First MD Initiated Contact with Patient 08/08/19 1257     (approximate)  I have reviewed the triage vital signs and the nursing notes.   HISTORY  Chief Complaint Leg Pain    HPI Amy [name] is a 84 y.o. female patient presents with complaint of phantom pain of the left leg.  Patient had a BKA in 2007.  Patient states she has episodes of phantom pain but this is the worst.  The patient was screaming this morning secondary to the pain.  The had a teleconference with [place] [facility] and was told to come to the emergency room.  Husband state he gave the patient had her usual dose of pain medicine for upon a.m. awakening with no improvement of her complaint.  No provocative incident for complaint.  Patient is under pain management for her narcotic pain medications.     Past Medical History:  Diagnosis Date  . Anemia    low iron  . Anxiety   . Arthritis   . Bilateral cataracts   . Bronchitis    hx of   . Cataract   . Chronic kidney disease   . Depression   . Diabetes mellitus without complication ([place])   . 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 Emphysema of lung ([place])   . GERD (gastroesophageal reflux disease)   . Headache   . History of frequent urinary tract infections   . Hx of renal cell cancer    LEFT  . Hyperlipidemia   . Hypertension   . Numbness and tingling    right hand   . Obesity   . Osteopenia 10/05/2017  . Peripheral artery disease ([place])   . Urinary frequency   . Urinary incontinence     Patient Active Problem List   Diagnosis Date Noted  . Bone metastasis ([place]) 03/13/2019  . Rib lesion 03/08/2019  . Right lower lobe pulmonary nodule 02/08/2019  . Cancer of intra-abdominal lymph nodes ([place]) 01/19/2019  . Genital labial ulcer 12/06/2018  . B12 deficiency 11/05/2018  . Folate  deficiency 11/05/2018  . Gastric cancer ([place])   . Stomach irritation   . Obesity (BMI 30.0-34.9) 09/11/2018  . Pulmonary nodules 08/27/2018  . Goals of care, counseling/discussion   . Palliative care by specialist   . Hypoxia 07/26/2018  . HCAP (healthcare-associated pneumonia) 07/02/2018  . Hypoxemia   . Renal cell carcinoma ([place])   . Lung nodule 04/23/2018  . Hypertensive renal disease 11/08/2017  . Secondary hyperparathyroidism of renal origin ([place]) 11/08/2017  . QT prolongation 10/27/2017  . Osteopenia 10/05/2017  . Choledocholithiasis 08/13/2017  . Chronic pain syndrome 04/05/2017  . Depression 11/15/2016  . Iron deficiency anemia 09/08/2016  . Anemia 06/29/2016  . Late onset Alzheimer's disease with behavioral disturbance ([place]) 06/14/2016  . S/P AKA (above knee amputation) unilateral, left ([place]) 06/08/2016  . Wheelchair dependent 03/11/2016  . 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 ([place]) 03/13/2015  . S/p nephrectomy 01/29/2015  . Renal neoplasm 01/15/2015  . Abnormal EKG 01/02/2015  . Renal mass 12/05/2014  . GERD (gastroesophageal reflux disease) 11/20/2014  . GAD (generalized anxiety disorder) 11/20/2014  . AB (asthmatic bronchitis) 11/20/2014  . Aortic atherosclerosis ([place]) 11/20/2014  . Cataract 11/20/2014  . Leg pain 11/20/2014  . Continuous opioid dependence ([place]) 11/20/2014  . CKD (chronic kidney disease) stage 4, GFR  15-29 ml/min ([place]) 11/20/2014  . COPD (chronic obstructive pulmonary disease) ([place]) 11/20/2014  . Excessive falling 11/20/2014  . Fatty infiltration of liver 11/20/2014  . Hyperlipidemia 11/20/2014  . Anterior knee pain 11/20/2014  . LBP (low back pain) 11/20/2014  . Malaise and fatigue 11/20/2014  . Abnormal presence of protein in urine 11/20/2014  . PVD (peripheral vascular disease) ([place]) 11/20/2014  . Allergic rhinitis 11/20/2014  . At risk for falling 11/20/2014  . Anxiety 11/05/2014  . Elevated serum creatinine 11/05/2014  .  Diabetes mellitus type 2, controlled ([place]) 11/05/2014  . H/O adenomatous polyp of colon 07/27/2010    Past Surgical History:  Procedure Laterality Date  . ABDOMINAL HYSTERECTOMY  1978  . CARPAL TUNNEL RELEASE Bilateral   . CHOLECYSTECTOMY    . COLONOSCOPY    . COLONOSCOPY WITH ESOPHAGOGASTRODUODENOSCOPY (EGD)    . ERCP N/A 08/19/2017   Procedure: ENDOSCOPIC RETROGRADE CHOLANGIOPANCREATOGRAPHY (ERCP);  Surgeon: Arta Silence, MD;  Location: [facility] ENDOSCOPY;  Service: Endoscopy;  Laterality: N/A;  . ESOPHAGOGASTRODUODENOSCOPY (EGD) WITH PROPOFOL N/A 10/12/2018   Procedure: ESOPHAGOGASTRODUODENOSCOPY (EGD) WITH PROPOFOL;  Surgeon: Virgel Manifold, MD;  Location: [facility] ENDOSCOPY;  Service: Endoscopy;  Laterality: N/A;  . LEG AMPUTATION     above the knee / left   . ROBOT ASSISTED LAPAROSCOPIC NEPHRECTOMY Left 01/15/2015   Procedure: ROBOTIC ASSISTED LAPAROSCOPIC RADICAL NEPHRECTOMY;  Surgeon: Alexis Frock, MD;  Location: [facility] ORS;  Service: Urology;  Laterality: Left;  . stent placement right leg       Prior to Admission medications   Medication Sig Start Date End Date Taking? Authorizing Provider  acetaminophen (TYLENOL) 325 MG tablet Take 325 mg by mouth every 6 (six) hours as needed.    [provider]  albuterol (VENTOLIN HFA) 108 (90 Base) MCG/ACT inhaler Inhale 1-2 puffs into the lungs every 4 (four) hours as needed.  06/15/17   [provider]  atorvastatin (LIPITOR) 20 MG tablet Take 1 tablet (20 mg total) by mouth daily at 6 PM. 01/19/19   [name], FNP  clopidogrel (PLAVIX) 75 MG tablet TAKE 1 TABLET EVERY DAY 07/12/19   Algernon Huxley, MD  Cyanocobalamin (VITAMIN B12 PO) Take by mouth.    [provider]  donepezil (ARICEPT) 10 MG tablet Take 1 tablet (10 mg total) by mouth at bedtime. 05/08/19   [name], FNP  famotidine (PEPCID) 20 MG tablet Take 1 tablet (20 mg total) by mouth daily. 01/19/19   [name], FNP  ferrous sulfate 325 (65 FE)  MG tablet Take 325 mg by mouth daily with breakfast. Pt taking every other day    [provider]  FLUoxetine (PROZAC) 10 MG capsule Take 1 capsule (10 mg total) by mouth daily. 07/22/18   Nicholes Mango, MD  gabapentin (NEURONTIN) 300 MG capsule Take 1 capsule (300 mg total) by mouth 3 (three) times daily. 07/24/19   Gillis Santa, MD  HYDROcodone-acetaminophen (NORCO) 7.5-325 MG tablet Take 1 tablet by mouth every 8 (eight) hours as needed for moderate pain. 07/27/19 08/26/19  Gillis Santa, MD  HYDROcodone-acetaminophen (NORCO) 7.5-325 MG tablet Take 1 tablet by mouth every 8 (eight) hours as needed for moderate pain. 08/26/19 09/25/19  Gillis Santa, MD  HYDROcodone-acetaminophen (NORCO) 7.5-325 MG tablet Take 1 tablet by mouth every 8 (eight) hours as needed for moderate pain. 09/25/19 10/25/19  Gillis Santa, MD  ipratropium-albuterol (DUONEB) 0.5-2.5 (3) MG/3ML SOLN INHALE THE CONTENTS OF 1 VIAL VIA NEBULIZER THREE TIMES DAILY AS DIRECTED  11/10/17   Laverle Hobby, MD  pantoprazole (PROTONIX) 40 MG tablet Take 1 tablet (40 mg total) by mouth daily. 03/01/19   [name], FNP  QUEtiapine (SEROQUEL) 25 MG tablet TAKE 1 TABLET EVERY MORNING, 1 TABLET EVERY AFTERNOON, AND 2 TABLETS BEFORE BEDTIME. 03/13/19   [name], FNP  Thiamine HCl (VITAMIN B-1) 250 MG tablet Take 250 mg by mouth daily.    [provider]    Allergies Fentanyl and Penicillins  Family History  Problem Relation Age of Onset  . Cancer Mother        Stomach  . Cancer Father   . Diabetes Brother   . Cancer Brother        oral  . Healthy Son   . Healthy Sister     Social History Social History   Tobacco Use  . Smoking status: Former Smoker    Packs/day: 1.50    Years: 51.00    Pack years: 76.50    Types: Cigarettes    Quit date: 05/24/2004    Years since quitting: 15.2  . Smokeless tobacco: Never Used  . Tobacco comment: smoking cessation materials not required  Substance Use Topics  . Alcohol  use: Not Currently  . Drug use: No    Review of Systems Constitutional: No fever/chills Eyes: No visual changes. ENT: No sore throat. Cardiovascular: Denies chest pain. Respiratory: Denies shortness of breath. Gastrointestinal: No abdominal pain.  No nausea, no vomiting.  No diarrhea.  No constipation. Genitourinary: Negative for dysuria. Musculoskeletal: Phantom left lower leg pain. Skin: Negative for rash. Neurological: Negative for headaches, focal weakness or numbness. Psychiatric: Anxiety.  Endocrine:  Chronic kidney disease, diabetes, hyperlipidemia, and hypertension. Hematological/Lymphatic: Anemia  Allergic/Immunilogical: Fentanyl and penicillin.  ____________________________________________   PHYSICAL EXAM:  VITAL SIGNS: ED Triage Vitals  Enc Vitals Group     BP 08/08/19 1219 (!) 149/91     Pulse Rate 08/08/19 1219 89     Resp 08/08/19 1219 20     Temp 08/08/19 1219 98.8 F (37.1 C)     Temp Source 08/08/19 1219 Oral     SpO2 08/08/19 1219 96 %     Weight 08/08/19 1220 154 lb (69.9 kg)     Height 08/08/19 1220 5\' 2"  (1.575 m)     Head Circumference --      Peak Flow --      Pain Score 08/08/19 1223 5     Pain Loc --      Pain Edu? --      Excl. in [place]? --     Constitutional: Alert and oriented. Well appearing and in no acute distress. Cardiovascular: Normal rate, regular rhythm. Grossly normal heart sounds.  Good peripheral circulation. Respiratory: Normal respiratory effort.  No retractions. Lungs CTAB. Gastrointestinal: Soft and nontender. No distention. No abdominal bruits. No CVA tenderness. Genitourinary: Deferred Musculoskeletal: No lower extremity tenderness nor edema.  No joint effusions. Neurologic:  Normal speech and language. No gross focal neurologic deficits are appreciated. No gait instability. Skin:  Skin is warm, dry and intact. No rash noted.  No erythema or edema.  No drainage. Psychiatric: Mood and affect are normal. Speech and behavior  are normal.  ____________________________________________   LABS (all labs ordered are listed, but only abnormal results are displayed)  Labs Reviewed  BASIC METABOLIC PANEL - Abnormal; Notable for the following components:      Result Value   Glucose, Bld 103 (*)    Creatinine, Ser 1.67 (*)  Calcium 8.4 (*)    GFR calc non Af Amer 28 (*)    GFR calc Af Amer 33 (*)    All other components within normal limits  CBC WITH DIFFERENTIAL/PLATELET - Abnormal; Notable for the following components:   Hemoglobin 10.9 (*)    HCT 34.3 (*)    RDW 15.8 (*)    All other components within normal limits   ____________________________________________  EKG   ____________________________________________  RADIOLOGY  ED MD interpretation:    Official radiology report(s): DG Hip Unilat W or Wo Pelvis 2-3 Views Left  Result Date: 08/08/2019 CLINICAL DATA:  Pain EXAM: DG HIP (WITH OR WITHOUT PELVIS) 2-3V LEFT COMPARISON:  None. FINDINGS: Frontal pelvis as well as frontal and lateral left hip images were obtained. : Bones are osteoporotic. No fracture or dislocation. There is moderate symmetric narrowing of each hip joint. There are stents in each common and external iliac artery as well as in the right common femoral artery. There are surgical clips in the left inguinal region. IMPRESSION: Bones osteoporotic. Symmetric narrowing each hip joint. No fracture or dislocation. Electronically Signed   By: Lowella Grip III M.D.   On: 08/08/2019 13:49    ____________________________________________   PROCEDURES  Procedure(s) performed (including Critical Care):  Procedures   ____________________________________________   INITIAL IMPRESSION / ASSESSMENT AND PLAN / ED COURSE  As part of my medical decision making, I reviewed the following data within the [place]     Patient presents with friend complaints of left lower extremities status post BKA in 2007.  Discussed  negative x-ray and lab results with patient.  Patient was given 0.5 mg of Dilaudid while waiting lab and x-ray results.  Reexamination of patient shows her to be in no acute distress and no complaint of pain at this time.  Advised patient to follow-up with her pain management doctor.    Amy [name] was evaluated in Emergency Department on 08/08/2019 for the symptoms described in the history of present illness. She was evaluated in the context of the global COVID-19 pandemic, which necessitated consideration that the patient might be at risk for infection with the SARS-CoV-2 virus that causes COVID-19. Institutional protocols and algorithms that pertain to the evaluation of patients at risk for COVID-19 are in a state of rapid change based on information released by regulatory bodies including the CDC and federal and state organizations. These policies and algorithms were followed during the patient's care in the ED.       ____________________________________________   FINAL CLINICAL IMPRESSION(S) / ED DIAGNOSES  Final diagnoses:  Phantom limb syndrome with pain [facility])     ED Discharge Orders    None       Note:  This document was prepared using Dragon voice recognition software and may include unintentional dictation errors.    Sable Feil, PA-C 08/08/19 1440    Earleen Newport, MD 08/08/19 [phone]

## 2019-08-08 NOTE — ED Notes (Signed)
Pt on 2L [place] chronically.

## 2019-08-08 NOTE — ED Notes (Signed)
See triage. Pt c/o left leg pain where knee amputation was. Pt on 2L [place] chronically.

## 2019-08-31 DIAGNOSIS — J449 Chronic obstructive pulmonary disease, unspecified: Secondary | ICD-10-CM | POA: Diagnosis not present

## 2019-09-01 ENCOUNTER — Other Ambulatory Visit: Payer: Self-pay | Admitting: Family Medicine

## 2019-09-01 DIAGNOSIS — K219 Gastro-esophageal reflux disease without esophagitis: Secondary | ICD-10-CM

## 2019-09-01 NOTE — Telephone Encounter (Signed)
Requested Prescriptions  Pending Prescriptions Disp Refills  . pantoprazole (PROTONIX) 40 MG tablet [Pharmacy Med Name: PANTOPRAZOLE SODIUM 40 MG DR TAB] 90 tablet 1    Sig: TAKE 1 TABLET BY MOUTH DAILY     Gastroenterology: Proton Pump Inhibitors Passed - 09/01/2019  8:44 AM      Passed - Valid encounter within last 12 months    Recent Outpatient Visits          2 months ago [facility] Delsa Grana, PA-C   3 months ago Centrilobular emphysema [facility])   [place], [place], FNP   5 months ago Pressure injury of buttock, stage 1, unspecified laterality   [place], FNP   7 months ago Aortic atherosclerosis [facility])   [place], FNP   7 months ago Abnormal CT scan of lung   [place], NP      Future Appointments            In 2 months [place], Ronda Fairly, MD [place]

## 2019-09-04 ENCOUNTER — Ambulatory Visit: Payer: Self-pay | Admitting: *Deleted

## 2019-09-04 ENCOUNTER — Telehealth: Payer: Self-pay

## 2019-09-04 NOTE — Chronic Care Management (AMB) (Signed)
   Chronic Care Management   Unsuccessful Call Note 09/04/2019 Name: Amy [name] MRN: [id number] DOB: January 01, 1936  Patient  is a 84 year old female who sees Raelyn Ensign FNP for primary care. Raelyn Ensign, FNP asked the CCM team to consult the patient for [facility].   This social worker was unable to reach patient's spouse via telephone today for initial re-engagement call. I was not able to leave HIPAA compliant voicemail asking patient's spouse  to return my call as both lines were busy. (unsuccessful outreach #1).   Plan: Will follow-up within 7 business days via telephone.     Elliot Gurney, [place] Administrator, [facility] Care Management [phone]

## 2019-09-07 ENCOUNTER — Ambulatory Visit: Payer: Self-pay | Admitting: *Deleted

## 2019-09-07 DIAGNOSIS — J449 Chronic obstructive pulmonary disease, unspecified: Secondary | ICD-10-CM

## 2019-09-07 DIAGNOSIS — F02818 Dementia in other diseases classified elsewhere, unspecified severity, with other behavioral disturbance: Secondary | ICD-10-CM

## 2019-09-07 DIAGNOSIS — G301 Alzheimer's disease with late onset: Secondary | ICD-10-CM

## 2019-09-07 DIAGNOSIS — L98491 Non-pressure chronic ulcer of skin of other sites limited to breakdown of skin: Secondary | ICD-10-CM | POA: Diagnosis not present

## 2019-09-07 NOTE — Chronic Care Management (AMB) (Signed)
Chronic Care Management    Clinical Social Work Follow Up Note  09/07/2019 Name: Amy [name] MRN: [id number] DOB: 1936-04-04  Amy [name] is a 84 y.o. year old female who is a primary care patient of [name], [place]. The CCM team was consulted for assistance with Level of Care Concerns.   Review of patient status, including review of consultants reports, other relevant assessments, and collaboration with appropriate care team members and the patient's provider was performed as part of comprehensive patient evaluation and provision of chronic care management services.    SDOH (Social Determinants of Health) assessments performed: No    Outpatient Encounter Medications as of 09/07/2019  Medication Sig  . acetaminophen (TYLENOL) 325 MG tablet Take 325 mg by mouth every 6 (six) hours as needed.  [name] albuterol (VENTOLIN HFA) 108 (90 Base) MCG/ACT inhaler Inhale 1-2 puffs into the lungs every 4 (four) hours as needed.   [name] atorvastatin (LIPITOR) 20 MG tablet Take 1 tablet (20 mg total) by mouth daily at 6 PM.  . clopidogrel (PLAVIX) 75 MG tablet TAKE 1 TABLET EVERY DAY  . Cyanocobalamin (VITAMIN B12 PO) Take by mouth.  . donepezil (ARICEPT) 10 MG tablet Take 1 tablet (10 mg total) by mouth at bedtime.  . famotidine (PEPCID) 20 MG tablet Take 1 tablet (20 mg total) by mouth daily.  . ferrous sulfate 325 (65 FE) MG tablet Take 325 mg by mouth daily with breakfast. Pt taking every other day  . FLUoxetine (PROZAC) 10 MG capsule Take 1 capsule (10 mg total) by mouth daily.  [name] gabapentin (NEURONTIN) 300 MG capsule Take 1 capsule (300 mg total) by mouth 3 (three) times daily.  [name] HYDROcodone-acetaminophen (NORCO) 7.5-325 MG tablet Take 1 tablet by mouth every 8 (eight) hours as needed for moderate pain.  [name] ON 09/25/2019] HYDROcodone-acetaminophen (NORCO) 7.5-325 MG tablet Take 1 tablet by mouth every 8 (eight) hours as needed for moderate pain.  [name] ipratropium-albuterol (DUONEB)  0.5-2.5 (3) MG/3ML SOLN INHALE THE CONTENTS OF 1 VIAL VIA NEBULIZER THREE TIMES DAILY AS DIRECTED  . pantoprazole (PROTONIX) 40 MG tablet TAKE 1 TABLET BY MOUTH DAILY  . QUEtiapine (SEROQUEL) 25 MG tablet TAKE 1 TABLET EVERY MORNING, 1 TABLET EVERY AFTERNOON, AND 2 TABLETS BEFORE BEDTIME.  [name] Thiamine HCl (VITAMIN B-1) 250 MG tablet Take 250 mg by mouth daily.   No facility-administered encounter medications on file as of 09/07/2019.     Goals Addressed            This Visit's Progress   . "I think I need to look in to long term care for my wife" (pt-stated)       [place] (see longitudinal plan of care for additional care plan information)  Current Barriers:  . Level of care concerns  Clinical Social Work Clinical Goal(s):  [name] Over the next 90 days, patient will work with SW to address concerns related to patient's level of care needs  Interventions: . Patient's spouse interviewed and appropriate assessments performed . Patient's spouse discussed difficulty managing patient at home due to current diagnosis . Patient's spouse discussed desire for placement, however is unsure of the process . Discussed plan to look in to the spend down process and will call patient's spouse back  . Discussed plans with patient for ongoing care management follow up and provided patient with direct contact information for care management team . Advised patient's spouse to consider consult with a Elder Law attorney in  regards to placement process  Patient Self Care Activities:  . Attends all scheduled provider appointments . Unable to perform ADLs independently . Unable to perform IADLs independently . Knowledge deficit of the out of home placement process  Initial goal documentation         Follow Up Plan: SW will follow up with patient by phone over the next 7-14 days   Teller, [place] Worker  [place] [facility] Care Management [phone]

## 2019-09-07 NOTE — Patient Instructions (Signed)
Thank you allowing the Chronic Care Management Team to be a part of your care! It was a pleasure speaking with you today!  1. Please call this social worker with any questions or concerns regarding patient's level of care needs  CCM (Chronic Care Management) Team   Neldon Labella  RN, BSN Nurse Care Coordinator  [phone]  [place], LCSW Clinical Social Worker [phone]  Goals Addressed            This Visit's Progress   . "I think I need to look in to long term care for my wife" (pt-stated)       Amy [name] (see longitudinal plan of care for additional care plan information)  Current Barriers:  . Level of care concerns  Clinical Social Work Clinical Goal(s):  [name] Over the next 90 days, patient will work with SW to address concerns related to patient's level of care needs  Interventions: . Patient's spouse interviewed and appropriate assessments performed . Patient's spouse discussed difficulty managing patient at home due to current diagnosis . Patient's spouse discussed desire for placement, however is unsure of the process . Discussed plan to look in to the spend down process and will call patient's spouse back  . Discussed plans with patient for ongoing care management follow up and provided patient with direct contact information for care management team . Advised patient's spouse to consider consult with a Elder Law attorney in regards to placement process  Patient Self Care Activities:  . Attends all scheduled provider appointments . Unable to perform ADLs independently . Unable to perform IADLs independently . Knowledge deficit of the out of home placement process  Initial goal documentation         The patient verbalized understanding of instructions provided today and declined a print copy of patient instruction materials.   Telephone follow up appointment with care management team member scheduled for: 09/11/19

## 2019-09-10 ENCOUNTER — Other Ambulatory Visit: Payer: Self-pay

## 2019-09-10 ENCOUNTER — Other Ambulatory Visit: Payer: Medicare HMO | Admitting: Primary Care

## 2019-09-10 DIAGNOSIS — Z515 Encounter for palliative care: Secondary | ICD-10-CM

## 2019-09-10 NOTE — Progress Notes (Addendum)
[facility] Consult Note Telephone: [phone]  Fax: [phone]    PATIENT NAME: Amy [name] [address] [place] [address] [phone] (home)  DOB: 12-13-1935 MRN: [id number]  PRIMARY CARE PROVIDER:   [name], [place], [address] [place] [address] [phone]  REFERRING PROVIDER:  [name], [place] [address],  [place] [address] [phone]  RESPONSIBLE PARTY:   Extended Emergency Contact Information Primary Emergency Contact: Perdew,[name] Address: [address]          [place], [place] [address] Johnnette Litter of [place] Phone: [phone] Mobile Phone: [phone] Relation: Spouse Secondary Emergency Contact: [place], [place] Phone: [phone] Mobile Phone: [phone] Relation: Son   I met with patient and family in the home.  ASSESSMENT AND RECOMMENDATIONS:   1. Advance Care Planning/Goals of Care: Goals include to maximize quality of life and symptom management. DNR, limited intervention, use of abx and iv, no feeding tube.  2. Symptom Management:   Skin integrity: Has break down on sacrum, using foam dressing.Needs round sacral dressing, 5 or 6". Has appt with wound management. Has 9 cm square allevyn, mistakenly ordered.  DOE: Has rhinitis from exertion Using oxygen per [place]. Refuses nebulizers most times. Some productive coung  Pain: Has pain in sacrum from breakdown. Is on sacrum much of the time as she cannot ambulate. Changes between chair and bed. Seen by pain management.  Dementia: Having some hallucinations, sundowning and  Insomnia. Has seroquel 25 in am and 75 mg in pm. Recommended 600 mg gabapentin at hs for insomnia and pain control, and 5-10 mg melatonin.   3. Family /Caregiver/Community Supports: Increased caregiver strain from pt not wanting to be alone. Calls out day and night for husband. Husband feels she should understand not to do  this. We discussed insomnia. Many attempts to teach about limitations of dementia patient perception/understanding. However, he is main caregiver and very overwhelmed.  He is seeking placement.   4. Cognitive / Functional decline: Alert, oriented x 1-2. Hallucinations reported. Intake is diminishing.  5. Follow up Palliative Care Visit: Palliative care will continue to follow for goals of care clarification and symptom management. Return 4-6 weeks or prn.  I spent 60 minutes providing this consultation,  from 1200 to 1300. More than 50% of the time in this consultation was spent coordinating communication.   HISTORY OF PRESENT ILLNESS:  Amy [name] is a 84 y.o. year old female with multiple medical problems including  dementia, DM, left AKA, pressure/incontinence injury. Palliative Care was asked to follow this patient by consultation request of [name], FNP to help address advance care planning and goals of care. This is a follow up visit.  CODE STATUS: DNR  PPS: 30% HOSPICE ELIGIBILITY/DIAGNOSIS: TBD  PAST MEDICAL HISTORY:  Past Medical History:  Diagnosis Date  . Anemia    low iron  . Anxiety   . Arthritis   . Bilateral cataracts   . Bronchitis    hx of   . Cataract   . Chronic kidney disease   . Depression   . Diabetes mellitus without complication ([facility])   . 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 Emphysema of lung ([facility])   . GERD (gastroesophageal reflux disease)   . Headache   . History of frequent urinary tract infections   . Hx of renal cell cancer  LEFT  . Hyperlipidemia   . Hypertension   . Numbness and tingling    right hand   . Obesity   . Osteopenia 10/05/2017  . Peripheral artery disease ([facility])   . Urinary frequency   . Urinary incontinence     SOCIAL HX:  Social History   Tobacco Use  . Smoking status: Former Smoker    Packs/day: 1.50    Years: 51.00    Pack years: 76.50    Types: Cigarettes     Quit date: 05/24/2004    Years since quitting: 15.3  . Smokeless tobacco: Never Used  . Tobacco comment: smoking cessation materials not required  Substance Use Topics  . Alcohol use: Not Currently    ALLERGIES:  Allergies  Allergen Reactions  . Fentanyl Other (See Comments)    urine retention  . Penicillins Itching, Rash and Other (See Comments)    Has patient had a PCN reaction causing immediate rash, facial/tongue/throat swelling, SOB or lightheadedness with hypotension: Yes Has patient had a PCN reaction causing severe rash involving mucus membranes or skin necrosis: No Has patient had a PCN reaction that required hospitalization: No Has patient had a PCN reaction occurring within the last 10 years: Yes If all of the above answers are "NO", then may proceed with Cephalosporin use.     PERTINENT MEDICATIONS:  Outpatient Encounter Medications as of 09/10/2019  Medication Sig  . acetaminophen (TYLENOL) 325 MG tablet Take 325 mg by mouth every 6 (six) hours as needed.  [name] albuterol (VENTOLIN HFA) 108 (90 Base) MCG/ACT inhaler Inhale 1-2 puffs into the lungs every 4 (four) hours as needed.   [name] atorvastatin (LIPITOR) 20 MG tablet Take 1 tablet (20 mg total) by mouth daily at 6 PM.  . clopidogrel (PLAVIX) 75 MG tablet TAKE 1 TABLET EVERY DAY  . Cyanocobalamin (VITAMIN B12 PO) Take by mouth.  . donepezil (ARICEPT) 10 MG tablet Take 1 tablet (10 mg total) by mouth at bedtime.  . ferrous sulfate 325 (65 FE) MG tablet Take 325 mg by mouth daily with breakfast. Pt taking every other day  . FLUoxetine (PROZAC) 10 MG capsule Take 1 capsule (10 mg total) by mouth daily.  [name] gabapentin (NEURONTIN) 300 MG capsule Take 1 capsule (300 mg total) by mouth 3 (three) times daily.  [name] HYDROcodone-acetaminophen (NORCO) 7.5-325 MG tablet Take 1 tablet by mouth every 8 (eight) hours as needed for moderate pain.  [name] ON 09/25/2019] HYDROcodone-acetaminophen (NORCO) 7.5-325 MG tablet Take 1 tablet by mouth  every 8 (eight) hours as needed for moderate pain.  [name] ipratropium-albuterol (DUONEB) 0.5-2.5 (3) MG/3ML SOLN INHALE THE CONTENTS OF 1 VIAL VIA NEBULIZER THREE TIMES DAILY AS DIRECTED  . pantoprazole (PROTONIX) 40 MG tablet TAKE 1 TABLET BY MOUTH DAILY  . QUEtiapine (SEROQUEL) 25 MG tablet Take 25 mg by mouth 2 (two) times daily. Takes at 2 pm and Total 75 mg a hs.  . QUEtiapine (SEROQUEL) 50 MG tablet Take 50 mg by mouth at bedtime.  . Thiamine HCl (VITAMIN B-1) 250 MG tablet Take 250 mg by mouth daily.  . famotidine (PEPCID) 20 MG tablet Take 1 tablet (20 mg total) by mouth daily. (Patient not taking: Reported on 09/10/2019)  . [DISCONTINUED] QUEtiapine (SEROQUEL) 25 MG tablet TAKE 1 TABLET EVERY MORNING, 1 TABLET EVERY AFTERNOON, AND 2 TABLETS BEFORE BEDTIME.   No facility-administered encounter medications on file as of 09/10/2019.     PHYSICAL EXAM / ROS:  128/76 HR 86 Afeb Current  and past weights:  unavailable General: NAD, frail appearing, WNWD Cardiovascular:  S1S2, no chest pain reported, no edema,  Pulmonary: + cough, no increased SOB, CLTA Abdomen: appetite fair, endorses early satiety, denies constipation, incontinent of bowel GU: denies dysuria, incontinent of urine MSK:  L AKA, non ambulatory,Transfers with total assistance Skin:  Break down in sacrum Neurological: Weakness, Dementia, hallucinations, sleep disturbances  Jason Coop, NP [facility]  COVID-19 PATIENT SCREENING TOOL  Person answering questions: _________[name]______ _____   1.  Is the patient or any family member in the home showing any signs or symptoms regarding respiratory infection?               Person with Symptom- __________NA_________________  a. Fever                                                                          Yes___ No___          ___________________  b. Shortness of breath                                                    Yes___ No___           ___________________ c. Cough/congestion                                       Yes___  No___         ___________________ d. Body aches/pains                                                         Yes___ No___        ____________________ e. Gastrointestinal symptoms (diarrhea, nausea)           Yes___ No___        ____________________  2. Within the past 14 days, has anyone living in the home had any contact with someone with or under investigation for COVID-19?    Yes___ No_X_   Person __________________

## 2019-09-11 ENCOUNTER — Ambulatory Visit: Payer: Self-pay | Admitting: *Deleted

## 2019-09-11 ENCOUNTER — Ambulatory Visit (INDEPENDENT_AMBULATORY_CARE_PROVIDER_SITE_OTHER): Payer: Medicare HMO | Admitting: *Deleted

## 2019-09-11 DIAGNOSIS — J449 Chronic obstructive pulmonary disease, unspecified: Secondary | ICD-10-CM

## 2019-09-11 DIAGNOSIS — G301 Alzheimer's disease with late onset: Secondary | ICD-10-CM | POA: Diagnosis not present

## 2019-09-11 DIAGNOSIS — F0281 Dementia in other diseases classified elsewhere with behavioral disturbance: Secondary | ICD-10-CM | POA: Diagnosis not present

## 2019-09-11 NOTE — Chronic Care Management (AMB) (Signed)
  Chronic Care Management   Social Work Note  09/11/2019 Name: Amy [name] MRN: [id number] DOB: 21-Jan-1936  Amy [name] is a 84 y.o. year old female who sees [name], FNP for primary care. The CCM team was consulted for assistance with Level of Care Concerns.   Phone call to palliative care social worker Atha Starks to discuss patient's level of care needs. Voicemail message left requesting a return call.  SDOH (Social Determinants of Health) assessments performed: No     Outpatient Encounter Medications as of 09/11/2019  Medication Sig  . acetaminophen (TYLENOL) 325 MG tablet Take 325 mg by mouth every 6 (six) hours as needed.  [name] albuterol (VENTOLIN HFA) 108 (90 Base) MCG/ACT inhaler Inhale 1-2 puffs into the lungs every 4 (four) hours as needed.   [name] atorvastatin (LIPITOR) 20 MG tablet Take 1 tablet (20 mg total) by mouth daily at 6 PM.  . clopidogrel (PLAVIX) 75 MG tablet TAKE 1 TABLET EVERY DAY  . Cyanocobalamin (VITAMIN B12 PO) Take by mouth.  . donepezil (ARICEPT) 10 MG tablet Take 1 tablet (10 mg total) by mouth at bedtime.  . famotidine (PEPCID) 20 MG tablet Take 1 tablet (20 mg total) by mouth daily. (Patient not taking: Reported on 09/10/2019)  . ferrous sulfate 325 (65 FE) MG tablet Take 325 mg by mouth daily with breakfast. Pt taking every other day  . FLUoxetine (PROZAC) 10 MG capsule Take 1 capsule (10 mg total) by mouth daily.  [name] gabapentin (NEURONTIN) 300 MG capsule Take 1 capsule (300 mg total) by mouth 3 (three) times daily.  [name] HYDROcodone-acetaminophen (NORCO) 7.5-325 MG tablet Take 1 tablet by mouth every 8 (eight) hours as needed for moderate pain.  [name] ON 09/25/2019] HYDROcodone-acetaminophen (NORCO) 7.5-325 MG tablet Take 1 tablet by mouth every 8 (eight) hours as needed for moderate pain.  [name] ipratropium-albuterol (DUONEB) 0.5-2.5 (3) MG/3ML SOLN INHALE THE CONTENTS OF 1 VIAL VIA NEBULIZER THREE TIMES DAILY AS DIRECTED  . pantoprazole  (PROTONIX) 40 MG tablet TAKE 1 TABLET BY MOUTH DAILY  . QUEtiapine (SEROQUEL) 25 MG tablet Take 25 mg by mouth 2 (two) times daily. Takes at 2 pm and Total 75 mg a hs.  . QUEtiapine (SEROQUEL) 50 MG tablet Take 50 mg by mouth at bedtime.  . Thiamine HCl (VITAMIN B-1) 250 MG tablet Take 250 mg by mouth daily.   No facility-administered encounter medications on file as of 09/11/2019.    Goals Addressed   None     Follow Up Plan: SW will follow up with patient by phone over the next 7-10 business days  [place], [place] Worker  [facility] Care Management [phone]

## 2019-09-11 NOTE — Patient Instructions (Addendum)
Thank you allowing the Chronic Care Management Team to be a part of your care! It was a pleasure speaking with you today!  1. Please contact the Elder Law firm of choice to discuss spend down options.  CCM (Chronic Care Management) Team   Neldon Labella RN, BSN Nurse Care Coordinator  [phone]   [place], LCSW Clinical Social Worker [phone]  Goals Addressed            This Visit's Progress   . "I think I need to look in to long term care for my wife" (pt-stated)       [place] (see longitudinal plan of care for additional care plan information)  Current Barriers:  . Level of care concerns  Clinical Social Work Clinical Goal(s):  [name] Over the next 90 days, patient will work with SW to address concerns related to patient's level of care needs  Interventions: . Patient's spouse continues to discuss difficulty managing patient at home due to current diagnosis . Patient's spouse discussed desire for placement, however is unsure of the process . Discussed plan to collaborate with a Elder Law Attorney-resource list of Elder Law Attorneys obtained from the [employer] . Discussed plans with patient for ongoing care management follow up and provided patient with direct contact information for care management team . Discussed plan to follow up with the social worker from the Palliative care team. VM left for a return call.  Patient Self Care Activities:  . Attends all scheduled provider appointments . Unable to perform ADLs independently . Unable to perform IADLs independently . Knowledge deficit of the out of home placement process  Please see past updates related to this goal by clicking on the "Past Updates" button in the selected goal          The patient verbalized understanding of instructions provided today and declined a print copy of patient instruction materials.   Telephone follow up appointment with care management  team member scheduled for: 09/18/19

## 2019-09-11 NOTE — Chronic Care Management (AMB) (Signed)
   Chronic Care Management   Unsuccessful Call Note 09/11/2019 Name: Amy [name] MRN: [id number] DOB: 1935/07/24  Patient  is a 84 year old female who sees Raelyn Ensign, FNP for primary care. Raelyn Ensign, FNP asked the CCM team to consult the patient for level of care needs.     This social worker was unable to reach patient's spouse via telephone today for follow up call. Per patient's aid, patient's spouse was working in the yard and will call this social worker back when he comes in. I    Plan: Will follow-up within 7 business days via telephone.     Elliot Gurney, [place] Administrator, [facility] [phone]

## 2019-09-11 NOTE — Chronic Care Management (AMB) (Signed)
Chronic Care Management    Clinical Social Work Follow Up Note  09/11/2019 Name: Amy [name] MRN: [id number] DOB: 08-15-1935  Amy [name] is a 84 y.o. year old female who is a primary care patient of [name], [place]. The CCM team was consulted for assistance with Level of Care Concerns.   Review of patient status, including review of consultants reports, other relevant assessments, and collaboration with appropriate care team members and the patient's provider was performed as part of comprehensive patient evaluation and provision of chronic care management services.    SDOH (Social Determinants of Health) assessments performed: No    Outpatient Encounter Medications as of 09/11/2019  Medication Sig  . acetaminophen (TYLENOL) 325 MG tablet Take 325 mg by mouth every 6 (six) hours as needed.  [name] albuterol (VENTOLIN HFA) 108 (90 Base) MCG/ACT inhaler Inhale 1-2 puffs into the lungs every 4 (four) hours as needed.   [name] atorvastatin (LIPITOR) 20 MG tablet Take 1 tablet (20 mg total) by mouth daily at 6 PM.  . clopidogrel (PLAVIX) 75 MG tablet TAKE 1 TABLET EVERY DAY  . Cyanocobalamin (VITAMIN B12 PO) Take by mouth.  . donepezil (ARICEPT) 10 MG tablet Take 1 tablet (10 mg total) by mouth at bedtime.  . famotidine (PEPCID) 20 MG tablet Take 1 tablet (20 mg total) by mouth daily. (Patient not taking: Reported on 09/10/2019)  . ferrous sulfate 325 (65 FE) MG tablet Take 325 mg by mouth daily with breakfast. Pt taking every other day  . FLUoxetine (PROZAC) 10 MG capsule Take 1 capsule (10 mg total) by mouth daily.  [name] gabapentin (NEURONTIN) 300 MG capsule Take 1 capsule (300 mg total) by mouth 3 (three) times daily.  [name] HYDROcodone-acetaminophen (NORCO) 7.5-325 MG tablet Take 1 tablet by mouth every 8 (eight) hours as needed for moderate pain.  [name] ON 09/25/2019] HYDROcodone-acetaminophen (NORCO) 7.5-325 MG tablet Take 1 tablet by mouth every 8 (eight) hours as needed for moderate  pain.  [name] ipratropium-albuterol (DUONEB) 0.5-2.5 (3) MG/3ML SOLN INHALE THE CONTENTS OF 1 VIAL VIA NEBULIZER THREE TIMES DAILY AS DIRECTED  . pantoprazole (PROTONIX) 40 MG tablet TAKE 1 TABLET BY MOUTH DAILY  . QUEtiapine (SEROQUEL) 25 MG tablet Take 25 mg by mouth 2 (two) times daily. Takes at 2 pm and Total 75 mg a hs.  . QUEtiapine (SEROQUEL) 50 MG tablet Take 50 mg by mouth at bedtime.  . Thiamine HCl (VITAMIN B-1) 250 MG tablet Take 250 mg by mouth daily.   No facility-administered encounter medications on file as of 09/11/2019.     Goals Addressed            This Visit's Progress   . "I think I need to look in to long term care for my wife" (pt-stated)       [place] (see longitudinal plan of care for additional care plan information)  Current Barriers:  . Level of care concerns  Clinical Social Work Clinical Goal(s):  [name] Over the next 90 days, patient will work with SW to address concerns related to patient's level of care needs  Interventions: . Patient's spouse continues to discuss difficulty managing patient at home due to current diagnosis . Patient's spouse discussed desire for placement, however is unsure of the process . Discussed plan to collaborate with a Elder Law Attorney-resource list of Elder Law Attorneys obtained from the [employer] . Discussed plans with patient for ongoing care management follow up  and provided patient with direct contact information for care management team . Discussed plan to follow up with the social worker from the Palliative care team. VM left for a return call.  Patient Self Care Activities:  . Attends all scheduled provider appointments . Unable to perform ADLs independently . Unable to perform IADLs independently . Knowledge deficit of the out of home placement process  Please see past updates related to this goal by clicking on the "Past Updates" button in the selected goal          Follow Up  Plan: SW will follow up with patient by phone over the next 7-14 days   [place], [place] Worker  [facility] Care [facility] [phone]

## 2019-09-12 ENCOUNTER — Encounter: Payer: Self-pay | Admitting: *Deleted

## 2019-09-12 ENCOUNTER — Encounter: Payer: Medicare HMO | Attending: Internal Medicine | Admitting: Internal Medicine

## 2019-09-12 ENCOUNTER — Ambulatory Visit: Payer: Self-pay | Admitting: *Deleted

## 2019-09-12 ENCOUNTER — Other Ambulatory Visit: Payer: Self-pay

## 2019-09-12 DIAGNOSIS — Z89612 Acquired absence of left leg above knee: Secondary | ICD-10-CM | POA: Diagnosis not present

## 2019-09-12 DIAGNOSIS — E11622 Type 2 diabetes mellitus with other skin ulcer: Secondary | ICD-10-CM | POA: Diagnosis not present

## 2019-09-12 DIAGNOSIS — N189 Chronic kidney disease, unspecified: Secondary | ICD-10-CM | POA: Diagnosis not present

## 2019-09-12 DIAGNOSIS — Z86718 Personal history of other venous thrombosis and embolism: Secondary | ICD-10-CM | POA: Diagnosis not present

## 2019-09-12 DIAGNOSIS — Z880 Allergy status to penicillin: Secondary | ICD-10-CM | POA: Diagnosis not present

## 2019-09-12 DIAGNOSIS — F0281 Dementia in other diseases classified elsewhere with behavioral disturbance: Secondary | ICD-10-CM | POA: Diagnosis not present

## 2019-09-12 DIAGNOSIS — R32 Unspecified urinary incontinence: Secondary | ICD-10-CM | POA: Insufficient documentation

## 2019-09-12 DIAGNOSIS — E1122 Type 2 diabetes mellitus with diabetic chronic kidney disease: Secondary | ICD-10-CM | POA: Insufficient documentation

## 2019-09-12 DIAGNOSIS — Z833 Family history of diabetes mellitus: Secondary | ICD-10-CM | POA: Insufficient documentation

## 2019-09-12 DIAGNOSIS — F0390 Unspecified dementia without behavioral disturbance: Secondary | ICD-10-CM | POA: Insufficient documentation

## 2019-09-12 DIAGNOSIS — Z85528 Personal history of other malignant neoplasm of kidney: Secondary | ICD-10-CM | POA: Diagnosis not present

## 2019-09-12 DIAGNOSIS — I129 Hypertensive chronic kidney disease with stage 1 through stage 4 chronic kidney disease, or unspecified chronic kidney disease: Secondary | ICD-10-CM | POA: Insufficient documentation

## 2019-09-12 DIAGNOSIS — Z905 Acquired absence of kidney: Secondary | ICD-10-CM | POA: Insufficient documentation

## 2019-09-12 DIAGNOSIS — L8942 Pressure ulcer of contiguous site of back, buttock and hip, stage 2: Secondary | ICD-10-CM | POA: Diagnosis not present

## 2019-09-12 DIAGNOSIS — G301 Alzheimer's disease with late onset: Secondary | ICD-10-CM | POA: Diagnosis not present

## 2019-09-12 DIAGNOSIS — L89152 Pressure ulcer of sacral region, stage 2: Secondary | ICD-10-CM | POA: Diagnosis not present

## 2019-09-12 DIAGNOSIS — M1990 Unspecified osteoarthritis, unspecified site: Secondary | ICD-10-CM | POA: Diagnosis not present

## 2019-09-12 DIAGNOSIS — Z87891 Personal history of nicotine dependence: Secondary | ICD-10-CM | POA: Insufficient documentation

## 2019-09-12 DIAGNOSIS — E1151 Type 2 diabetes mellitus with diabetic peripheral angiopathy without gangrene: Secondary | ICD-10-CM | POA: Insufficient documentation

## 2019-09-12 DIAGNOSIS — Z885 Allergy status to narcotic agent status: Secondary | ICD-10-CM | POA: Insufficient documentation

## 2019-09-12 DIAGNOSIS — J449 Chronic obstructive pulmonary disease, unspecified: Secondary | ICD-10-CM | POA: Diagnosis not present

## 2019-09-12 NOTE — Patient Instructions (Addendum)
Thank you allowing the Chronic Care Management Team to be a part of your care! It was a pleasure speaking with you today!  1. Please follow up with the Trish Fountain attorney schedule for 09/25/19 at [place] (Chronic Care Management) Team   Neldon Labella  RN, BSN Nurse Care Coordinator  [phone]  [place], LCSW Clinical Social Worker [phone]  Goals Addressed            This Visit's Progress   . "I think I need to look in to long term care for my wife" (pt-stated)       [place] (see longitudinal plan of care for additional care plan information)  Current Barriers:  . Level of care concerns  Clinical Social Work Clinical Goal(s):  [name] Over the next 90 days, patient's spouse will work with SW to address concerns related to patient's level of care needs  Interventions: . Patient's spouse continues to discuss difficulty managing patient at home due to current diagnosis . Discussed plan to collaborate with a Elder Law Attorney-confirmed that the appointment is scheduled with Miguel Dibble on 09/25/19/ at [place] . Tentative questions to ask attorney during the consultation reviewed . Discussed plans with patient for ongoing care management follow up and provided patient with direct contact information for care management team . Discussed plan to follow up with the social worker from the Palliative care team. VM left for a return call.  Patient Self Care Activities:  . Attends all scheduled provider appointments . Unable to perform ADLs independently . Unable to perform IADLs independently . Knowledge deficit of the out of home placement process  Please see past updates related to this goal by clicking on the "Past Updates" button in the selected goal          The patient verbalized understanding of instructions provided today and declined a print copy of patient instruction materials.   Telephone follow up appointment with care management team member  scheduled for: 10/01/19

## 2019-09-12 NOTE — Chronic Care Management (AMB) (Signed)
Chronic Care Management    Clinical Social Work Follow Up Note  09/12/2019 Name: Amy [name] MRN: [id number] DOB: 1935-06-29  Amy [name] is a 84 y.o. year old female who is a primary care patient of Amy [name], Amy [name]. The CCM team was consulted for assistance with Level of Care Concerns.   Review of patient status, including review of consultants reports, other relevant assessments, and collaboration with appropriate care team members and the patient's provider was performed as part of comprehensive patient evaluation and provision of chronic care management services.    SDOH (Social Determinants of Health) assessments performed: No    Outpatient Encounter Medications as of 09/12/2019  Medication Sig  . acetaminophen (TYLENOL) 325 MG tablet Take 325 mg by mouth every 6 (six) hours as needed.  [name] albuterol (VENTOLIN HFA) 108 (90 Base) MCG/ACT inhaler Inhale 1-2 puffs into the lungs every 4 (four) hours as needed.   [name] atorvastatin (LIPITOR) 20 MG tablet Take 1 tablet (20 mg total) by mouth daily at 6 PM.  . clopidogrel (PLAVIX) 75 MG tablet TAKE 1 TABLET EVERY DAY  . Cyanocobalamin (VITAMIN B12 PO) Take by mouth.  . donepezil (ARICEPT) 10 MG tablet Take 1 tablet (10 mg total) by mouth at bedtime.  . famotidine (PEPCID) 20 MG tablet Take 1 tablet (20 mg total) by mouth daily. (Patient not taking: Reported on 09/10/2019)  . ferrous sulfate 325 (65 FE) MG tablet Take 325 mg by mouth daily with breakfast. Pt taking every other day  . FLUoxetine (PROZAC) 10 MG capsule Take 1 capsule (10 mg total) by mouth daily.  [name] gabapentin (NEURONTIN) 300 MG capsule Take 1 capsule (300 mg total) by mouth 3 (three) times daily.  [name] HYDROcodone-acetaminophen (NORCO) 7.5-325 MG tablet Take 1 tablet by mouth every 8 (eight) hours as needed for moderate pain.  [name] ON 09/25/2019] HYDROcodone-acetaminophen (NORCO) 7.5-325 MG tablet Take 1 tablet by mouth every 8 (eight) hours as needed for moderate  pain.  [name] ipratropium-albuterol (DUONEB) 0.5-2.5 (3) MG/3ML SOLN INHALE THE CONTENTS OF 1 VIAL VIA NEBULIZER THREE TIMES DAILY AS DIRECTED  . pantoprazole (PROTONIX) 40 MG tablet TAKE 1 TABLET BY MOUTH DAILY  . QUEtiapine (SEROQUEL) 25 MG tablet Take 25 mg by mouth 2 (two) times daily. Takes at 2 pm and Total 75 mg a hs.  . QUEtiapine (SEROQUEL) 50 MG tablet Take 50 mg by mouth at bedtime.  . Thiamine HCl (VITAMIN B-1) 250 MG tablet Take 250 mg by mouth daily.   No facility-administered encounter medications on file as of 09/12/2019.     Goals Addressed            This Visit's Progress   . "I think I need to look in to long term care for my wife" (pt-stated)       [place] (see longitudinal plan of care for additional care plan information)  Current Barriers:  . Level of care concerns  Clinical Social Work Clinical Goal(s):  [name] Over the next 90 days, patient's spouse will work with SW to address concerns related to patient's level of care needs  Interventions: . Patient's spouse continues to discuss difficulty managing patient at home due to current diagnosis . Discussed plan to collaborate with a Elder Law Attorney-confirmed that the appointment is scheduled with Amy [name] on 09/25/19/ at [place] . Tentative questions to ask attorney during the consultation reviewed . Discussed plans with patient for ongoing care management follow up and provided patient  with direct contact information for care management team . Discussed plan to follow up with the social worker from the Palliative care team. VM left for a return call.  Patient Self Care Activities:  . Attends all scheduled provider appointments . Unable to perform ADLs independently . Unable to perform IADLs independently . Knowledge deficit of the out of home placement process  Please see past updates related to this goal by clicking on the "Past Updates" button in the selected goal          Follow Up Plan: SW will  follow up with patient's spouse by phone over the next 2-3 weeks   Amy [name], [place] Worker  [facility] [phone]

## 2019-09-12 NOTE — Progress Notes (Signed)
This encounter was created in error - please disregard.

## 2019-09-12 NOTE — Progress Notes (Signed)
Fugitt, Shelisha L. ([id number]) Visit Report for 09/12/2019 Abuse/Suicide Risk Screen Details Patient Name: Amy [name], Amy [name]. Date of Service: 09/12/2019 9:45 AM Medical Record Number: [id number] Patient Account Number: [id number] Date of Birth/Sex: 1936/02/03 (84 y.o. F) Treating RN: Montey Hora Primary Care Cierria Height: Raelyn Ensign Other Clinician: Referring Maclain Cohron: Referral, Self Treating Vonya Ohalloran/Extender: Tito Dine in Treatment: 0 Abuse/Suicide Risk Screen Items Answer ABUSE RISK SCREEN: Has anyone close to you tried to hurt or harm you recentlyo No Do you feel uncomfortable with anyone in your familyo No Has anyone forced you do things that you didnot want to doo No Electronic Signature(s) Signed: 09/12/2019 4:33:58 PM By: Montey Hora Entered By: Montey Hora on 09/12/2019 10:00:36 Clendenin, Shakelia L. ([id number]) -------------------------------------------------------------------------------- Activities of Daily Living Details Patient Name: Amy [name], [name] Date of Service: 09/12/2019 9:45 AM Medical Record Number: [id number] Patient Account Number: [id number] Date of Birth/Sex: Oct 15, 1935 (84 y.o. F) Treating RN: Montey Hora Primary Care Maleeya Peterkin: Raelyn Ensign Other Clinician: Referring Glendoris Nodarse: Referral, Self Treating Abubakar Crispo/Extender: Tito Dine in Treatment: 0 Activities of Daily Living Items Answer Activities of Daily Living (Please select one for each item) Drive Automobile Not Able Take Medications Need Assistance Use Telephone Completely Able Care for Appearance Need Assistance Use Toilet Need Assistance Bath / Shower Need Assistance Dress Self Need Assistance Feed Self Completely Able Walk Not Able Get In / Out Bed Need Assistance Housework Need Assistance Prepare Meals Need Assistance Handle Money Need Assistance Shop for Self Need Assistance Electronic Signature(s) Signed: 09/12/2019 4:33:58 PM By: Montey Hora Entered By: Montey Hora on 09/12/2019 10:01:01 Mcneece, Aymar L. ([id number]) -------------------------------------------------------------------------------- Education Screening Details Patient Name: Amy [name], [name] Date of Service: 09/12/2019 9:45 AM Medical Record Number: [id number] Patient Account Number: [id number] Date of Birth/Sex: 21-Mar-1936 (84 y.o. F) Treating RN: Montey Hora Primary Care Arianne Klinge: Raelyn Ensign Other Clinician: Referring Jorian Willhoite: Referral, Self Treating Brandelyn Henne/Extender: Tito Dine in Treatment: 0 Primary Learner Assessed: Patient Learning Preferences/Education Level/Primary Language Learning Preference: Explanation, Demonstration Highest Education Level: High School Preferred Language: English Cognitive Barrier Language Barrier: No Translator Needed: No Memory Deficit: No Emotional Barrier: No Cultural/Religious Beliefs Affecting Medical Care: No Physical Barrier Impaired Vision: No Impaired Hearing: No Decreased Hand dexterity: No Knowledge/Comprehension Knowledge Level: Medium Comprehension Level: Medium Ability to understand written instructions: Medium Ability to understand verbal instructions: Medium Motivation Anxiety Level: Calm Cooperation: Cooperative Education Importance: Acknowledges Need Interest in Health Problems: Asks Questions Perception: Coherent Willingness to Engage in Self-Management Medium Activities: Readiness to Engage in Self-Management Medium Activities: Electronic Signature(s) Signed: 09/12/2019 4:33:58 PM By: Montey Hora Entered By: Montey Hora on 09/12/2019 10:01:37 Bistline, Shylynn L. ([id number]) -------------------------------------------------------------------------------- Fall Risk Assessment Details Patient Name: Amy [name], [name] Date of Service: 09/12/2019 9:45 AM Medical Record Number: [id number] Patient Account Number: [id number] Date of Birth/Sex:  1936/02/28 (84 y.o. F) Treating RN: Montey Hora Primary Care Sebron Mcmahill: Raelyn Ensign Other Clinician: Referring Makayah Pauli: Referral, Self Treating Clayten Allcock/Extender: Tito Dine in Treatment: 0 Fall Risk Assessment Items Have you had 2 or more falls in the last 12 monthso 0 No Have you had any fall that resulted in injury in the last 12 monthso 0 No FALLS RISK SCREEN History of falling - immediate or within 3 months 0 No Secondary diagnosis (Do you have 2 or more medical diagnoseso) 0 No Ambulatory aid None/bed rest/wheelchair/nurse 0 Yes Crutches/cane/walker 0 No Furniture 0 No Intravenous therapy Access/Saline/Heparin Lock 0 No Gait/Transferring Normal/ bed rest/ wheelchair 0 Yes Weak (short steps  with or without shuffle, stooped but able to lift head while walking, may seek 0 No support from furniture) Impaired (short steps with shuffle, may have difficulty arising from chair, head down, impaired 0 No balance) Mental Status Oriented to own ability 0 Yes Electronic Signature(s) Signed: 09/12/2019 4:33:58 PM By: Montey Hora Entered By: Montey Hora on 09/12/2019 10:01:48 Cogliano, Jillisa L. ([id number]) -------------------------------------------------------------------------------- Foot Assessment Details Patient Name: Amy [name], [name] Date of Service: 09/12/2019 9:45 AM Medical Record Number: [id number] Patient Account Number: [id number] Date of Birth/Sex: Mar 06, 1936 (84 y.o. F) Treating RN: Montey Hora Primary Care Delecia Vastine: Raelyn Ensign Other Clinician: Referring Dhiren Azimi: Referral, Self Treating Jannette Cotham/Extender: Tito Dine in Treatment: 0 Foot Assessment Items Site Locations + = Sensation present, - = Sensation absent, C = Callus, U = Ulcer R = Redness, W = Warmth, M = Maceration, PU = Pre-ulcerative lesion F = Fissure, S = Swelling, D = Dryness Assessment Right: Left: Other Deformity: No No Prior Foot Ulcer: No No Prior  Amputation: No No Charcot Joint: No No Ambulatory Status: Non-ambulatory Assistance Device: Wheelchair Gait: Steady Electronic Signature(s) Signed: 09/12/2019 4:33:58 PM By: Montey Hora Entered By: Montey Hora on 09/12/2019 10:02:03 Sawa, Zennie L. ([id number]) -------------------------------------------------------------------------------- Nutrition Risk Screening Details Patient Name: Amy [name], [name] Date of Service: 09/12/2019 9:45 AM Medical Record Number: [id number] Patient Account Number: [id number] Date of Birth/Sex: [birth year] (84 y.o. F) Treating RN: Montey Hora Primary Care Conita Amenta: Raelyn Ensign Other Clinician: Referring Yvanna Vidas: Referral, Self Treating Lawsyn Heiler/Extender: Tito Dine in Treatment: 0 Height (in): 62 Weight (lbs): 154 Body Mass Index (BMI): 28.2 Nutrition Risk Screening Items Score Screening NUTRITION RISK SCREEN: I have an illness or condition that made me change the kind and/or amount of food I eat 0 No I eat fewer than two meals per day 0 No I eat few fruits and vegetables, or milk products 0 No I have three or more drinks of beer, liquor or wine almost every day 0 No I have tooth or mouth problems that make it hard for me to eat 0 No I don't always have enough money to buy the food I need 0 No I eat alone most of the time 0 No I take three or more different prescribed or over-the-counter drugs a day 1 Yes Without wanting to, I have lost or gained 10 pounds in the last six months 0 No I am not always physically able to shop, cook and/or feed myself 0 No Nutrition Protocols Good Risk Protocol 0 No interventions needed Moderate Risk Protocol High Risk Proctocol Risk Level: Good Risk Score: 1 Electronic Signature(s) Signed: 09/12/2019 4:33:58 PM By: Montey Hora Entered By: Montey Hora on 09/12/2019 10:01:54

## 2019-09-13 DIAGNOSIS — H5203 Hypermetropia, bilateral: Secondary | ICD-10-CM | POA: Diagnosis not present

## 2019-09-13 DIAGNOSIS — H52209 Unspecified astigmatism, unspecified eye: Secondary | ICD-10-CM | POA: Diagnosis not present

## 2019-09-13 DIAGNOSIS — H524 Presbyopia: Secondary | ICD-10-CM | POA: Diagnosis not present

## 2019-09-13 NOTE — Progress Notes (Signed)
Yeats, Declan L. ([id number]) Visit Report for 09/12/2019 HPI Details Patient Name: Amy [name], Amy [name]. Date of Service: 09/12/2019 9:45 AM Medical Record Number: [id number] Patient Account Number: [id number] Date of Birth/Sex: [birth year] (84 y.o. Female) Treating RN: Cornell Barman Primary Care Provider: Raelyn Ensign Other Clinician: Referring Provider: Referral, Self Treating Provider/Extender: Tito Dine in Treatment: 0 History of Present Illness Associated Signs and Symptoms: The patient has a history of chronic kidney disease, diabetes mellitus type II although her hemoglobin A1c most recent was 5.8 and she is on the medications. 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 She is incontinent. HPI Description: 03/10/18 on evaluation today patient actually presents for evaluation concerning her bilateral gluteal ulcers which have been present for several weeks. She has been treated over time with different creams most recently polysporin. With that being said the patient actually seems to be doing somewhat better at this point in time during evaluation today. Nonetheless she still does have an opening mainly on the left side the right pretty much appears to be closed in my opinion. She does have pain on the left gluteal region. She also tells me about an area under the abdominal fold in the lower abdominal region which appears to be new just yesterday or today. This appears to potentially be a little bit of a fungal rash versus possibly friction from the way her husband has to help her with transfer. No fevers, chills, nausea, or vomiting noted at this time. Patient has dementia and has a very difficult time sometimes following directions. Her husband does the best he can taking care for home but he states is difficult sometimes to get her to do  anything but just sit either in her wheelchair or the recliner which is probably why she's having such issues with her gluteal region. Nonetheless considering how much time she seems to spend sitting I'm pleased that the wounds are not any worse in appearance. 03/17/18 on evaluation today patient actually appears to be completely healed in regard to her ulcers on her gluteal region. She has been tolerating the dressing changes without complication. Her abdominal area also appears to be doing better there is no opening at the site either she continues to use the cream that was prescribed at the last visit. That is the next one to one of zinc oxide and nystatin cream. Overall this seems to be doing very well at this point in time. Readmission: 04/09/2019 on evaluation today patient appears to be doing somewhat more poorly. This is a patient whom I saw almost a year ago. She has had some discoloration in her gluteal region in general since that time. Again I think this is always seemingly due to excessive pressure to the area. She does have her husband with her who is actually her primary caregiver and does a very good job with her in my opinion. With that being said he obviously is somewhat frustrated in general with the fact that this keeps staying open he is also frustrated with the fact that she seems to not really want to stay in the bed to offload and lay on her side she likes to lay on her back he is having a very hard time finding a way to help her keep pressure off of the region. She does have dementia and this is playing a big role as well.  Subsequently he did try to get her a different mattress to go underneath her chair in order to help with offloading but she would not even give it a try. Subsequently he did try donut as well but that just she seems to be moving. He really cannot keep that in the right place. I suggested to him that a piece of memory foam may be better as far as not having to  worry about exact positioning this would help with giving additional pressure offloading support. 04/16/2019 on evaluation today patient actually appears to be completely healed based on what I am seeing. Unfortunately she did not like the [facility] along with a border foam dressing that we ordered at all. She complained that it hurt as soon as she sat on it and she did not want to wear it. Subsequently they ended up discontinuing that and have just been using the Desitin at this point. He did actually have his son help him to put a memory foam mattress on the bed and he also had several pieces he was able to cut to make memory foam cushions for her chair and that has seemed to make a dramatic improvement with regard to the pressure to her gluteal region. With that being said the patient also had been complaining of back pain pretty much every morning and also has not been complaining of this either. Overall he is actually very pleased with how things are doing with the memory foam. READMISSION 09/12/2019 This is a now 84 year old woman we have seen in clinic on 2 separate occasions but I do not believe I have seen her in the past. She was here in 2018 and again in 2020 for gluteal wounds which were felt to be pressure. She has dementia when she sits and lies on her bed on her buttocks and according to her husband there is just no way to get her off this. Complicating the issue is she has urinary incontinence and although her husband attempts to change this frequently he often finds the area wet with urine. The patient is nonambulatory. She is in a wheelchair or lying on her back in bed. She does have memory foam over the mattress in the wheelchair. She is here today as she is felt to have new wounds. Our intake and nurse initially did not notice anything but some excoriation on the skin of her buttocks around the gluteal cleft. Past medical history includes type 2 diabetes, left AKA, urinary  incontinence, prior history of left nephrectomy for malignancy, dementia, hypertension, history of PAD. She has been on oxygen for 2 L for about a year. I am not sure what the primary diagnosis is. [name], [name] ([id number]) Electronic Signature(s) Signed: 09/13/2019 7:42:01 AM By: Linton Ham MD Entered By: Linton Ham on 09/12/2019 10:35:51 [name], [name] ([id number]) -------------------------------------------------------------------------------- Physical Exam Details Patient Name: [name], [name] Date of Service: 09/12/2019 9:45 AM Medical Record Number: [id number] Patient Account Number: [id number] Date of Birth/Sex: 12/06/1935 (84 y.o. Female) Treating RN: Cornell Barman Primary Care Provider: Raelyn Ensign Other Clinician: Referring Provider: Referral, Self Treating Provider/Extender: Ricard Dillon Weeks in Treatment: 0 Constitutional Sitting or standing Blood Pressure is within target range for patient.. Pulse regular and within target range for patient.[name] Respirations regular, non- labored and within target range.. Temperature is normal and within the target range for the patient.[name] appears in no distress. Respiratory Oxygen on at 2 L lungs sound fairly clear. Cardiovascular Heart rhythm and rate regular, without  murmur or gallop.. Gastrointestinal (GI) Abdomen is soft and non-distended without masses or tenderness. Bowel sounds active in all quadrants.. No liver or spleen enlargement or tenderness.. Genitourinary (GU) Bladder is not distended. Psychiatric Patient appears very anxiouso Depression. Notes Wound exam; the patient has open wounds in the continuation of her gluteal cleft towards the coccyx and sacrum. These are linear wounds. There are two linear areas. Firstly inferiorly there is a longer area and then more superiorly a shorter area. Although these are small areas they seem very uncomfortable to the patient. Electronic  Signature(s) Signed: 09/13/2019 7:42:01 AM By: Linton Ham MD Entered By: Linton Ham on 09/12/2019 10:43:38 [name], [name] ([id number]) -------------------------------------------------------------------------------- Physician Orders Details Patient Name: GAYLEEN, SHOLTZ L. Date of Service: 09/12/2019 9:45 AM Medical Record Number: [id number] Patient Account Number: [id number] Date of Birth/Sex: [birth year] (84 y.o. Female) Treating RN: Cornell Barman Primary Care Provider: Raelyn Ensign Other Clinician: Referring Provider: Referral, Self Treating Provider/Extender: Tito Dine in Treatment: 0 Verbal / Phone Orders: No Diagnosis Coding Skin Barriers/Peri-Wound Care Wound #5 Midline Gluteus o Other: - Keep area clean and dry Primary Wound Dressing Wound #5 Midline Gluteus o Other: - silver cell placed onto wound Dressing Change Frequency Wound #5 Midline Gluteus o Change Dressing Monday, Wednesday, Friday - or more often if soiled Follow-up Appointments Wound #5 Midline Gluteus o Return Appointment in 2 weeks. Off-Loading Wound #5 Midline Gluteus o Turn and reposition every 2 hours - keep pressure off of wounded areas Home Health Wound #5 Midline [place] for Skilled Nursing - Sent to [facility] Nurse may visit PRN to address patientos wound care needs. o FACE TO FACE ENCOUNTER: MEDICARE and MEDICAID PATIENTS: I certify that this patient is under my care and that I had a face-to- face encounter that meets the physician face-to-face encounter requirements with this patient on this date. The encounter with the patient was in whole or in part for the following MEDICAL CONDITION: (primary reason for [place]) MEDICAL NECESSITY: I certify, that based on my findings, NURSING services are a medically necessary home health service. HOME BOUND STATUS: I certify that my clinical findings support that this  patient is homebound (i.e., Due to illness or injury, pt requires aid of supportive devices such as crutches, cane, wheelchairs, walkers, the use of special transportation or the assistance of another person to leave their place of residence. There is a normal inability to leave the home and doing so requires considerable and taxing effort. Other absences are for medical reasons / religious services and are infrequent or of short duration when for other reasons). o If current dressing causes regression in wound condition, may D/C ordered dressing product/s and apply Normal Saline Moist Dressing daily until next [place] / Other MD appointment. [place] of regression in wound condition at [phone]. o Please direct any NON-WOUND related issues/requests for orders to patient's Primary Care Physician Electronic Signature(s) Signed: 09/13/2019 4:21:14 PM By: Linton Ham MD Signed: 09/13/2019 5:10:21 PM By: Gretta Cool BSN, RN, CWS, Kim RN, BSN Previous Signature: 09/13/2019 7:42:01 AM Version By: Linton Ham MD Entered By: Gretta Cool, BSN, RN, CWS, Kim on 09/13/2019 13:19:18 [name], [name] ([id number]) -------------------------------------------------------------------------------- Problem List Details Patient Name: [name], [name] Date of Service: 09/12/2019 9:45 AM Medical Record Number: [id number] Patient Account Number: [id number] Date of Birth/Sex: 01/24/1936 (84 y.o. Female) Treating RN: Cornell Barman Primary Care Provider: Raelyn Ensign Other Clinician: Referring Provider: Referral, Self Treating  Provider/Extender: Ricard Dillon Weeks in Treatment: 0 Active Problems ICD-10 Evaluated Encounter Code Description Active Date Today Diagnosis L89.152 Pressure ulcer of sacral region, stage 2 09/12/2019 No Yes Inactive Problems Resolved Problems Electronic Signature(s) Signed: 09/13/2019 7:42:01 AM By: Linton Ham MD Entered By: Linton Ham on 09/12/2019 10:31:40 Bann, Gizzelle L. ([id number]) -------------------------------------------------------------------------------- Progress Note Details Patient Name: [name], [name] Date of Service: 09/12/2019 9:45 AM Medical Record Number: [id number] Patient Account Number: [id number] Date of Birth/Sex: 04/01/1936 (84 y.o. Female) Treating RN: Cornell Barman Primary Care Provider: Raelyn Ensign Other Clinician: Referring Provider: Referral, Self Treating Provider/Extender: Tito Dine in Treatment: 0 Subjective History of Present Illness (HPI) The following HPI elements were documented for the patient's wound: Associated Signs and Symptoms: The patient has a history of chronic kidney disease, diabetes mellitus type II although her hemoglobin A1c most recent was 5.8 and she is on the medications. 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 She is incontinent. 03/10/18 on evaluation today patient actually presents for evaluation concerning her bilateral gluteal ulcers which have been present for several weeks. She has been treated over time with different creams most recently polysporin. With that being said the patient actually seems to be doing somewhat better at this point in time during evaluation today. Nonetheless she still does have an opening mainly on the left side the right pretty much appears to be closed in my opinion. She does have pain on the left gluteal region. She also tells me about an area under the abdominal fold in the lower abdominal region which appears to be new just yesterday or today. This appears to potentially be a little bit of a fungal rash versus possibly friction from the way her husband has to help her with transfer. No fevers, chills, nausea, or vomiting noted at this time. Patient has dementia and has a  very difficult time sometimes following directions. Her husband does the best he can taking care for home but he states is difficult sometimes to get her to do anything but just sit either in her wheelchair or the recliner which is probably why she's having such issues with her gluteal region. Nonetheless considering how much time she seems to spend sitting I'm pleased that the wounds are not any worse in appearance. 03/17/18 on evaluation today patient actually appears to be completely healed in regard to her ulcers on her gluteal region. She has been tolerating the dressing changes without complication. Her abdominal area also appears to be doing better there is no opening at the site either she continues to use the cream that was prescribed at the last visit. That is the next one to one of zinc oxide and nystatin cream. Overall this seems to be doing very well at this point in time. Readmission: 04/09/2019 on evaluation today patient appears to be doing somewhat more poorly. This is a patient whom I saw almost a year ago. She has had some discoloration in her gluteal region in general since that time. Again I think this is always seemingly due to excessive pressure to the area. She does have her husband with her who is actually her primary caregiver and does a very good job with her in my opinion. With that being said he obviously is somewhat frustrated in general with the fact that this keeps staying open he is also frustrated with  the fact that she seems to not really want to stay in the bed to offload and lay on her side she likes to lay on her back he is having a very hard time finding a way to help her keep pressure off of the region. She does have dementia and this is playing a big role as well. Subsequently he did try to get her a different mattress to go underneath her chair in order to help with offloading but she would not even give it a try. Subsequently he did try donut as well but that  just she seems to be moving. He really cannot keep that in the right place. I suggested to him that a piece of memory foam may be better as far as not having to worry about exact positioning this would help with giving additional pressure offloading support. 04/16/2019 on evaluation today patient actually appears to be completely healed based on what I am seeing. Unfortunately she did not like the [facility] along with a border foam dressing that we ordered at all. She complained that it hurt as soon as she sat on it and she did not want to wear it. Subsequently they ended up discontinuing that and have just been using the Desitin at this point. He did actually have his son help him to put a memory foam mattress on the bed and he also had several pieces he was able to cut to make memory foam cushions for her chair and that has seemed to make a dramatic improvement with regard to the pressure to her gluteal region. With that being said the patient also had been complaining of back pain pretty much every morning and also has not been complaining of this either. Overall he is actually very pleased with how things are doing with the memory foam. READMISSION 09/12/2019 This is a now 84 year old woman we have seen in clinic on 2 separate occasions but I do not believe I have seen her in the past. She was here in 2018 and again in 2020 for gluteal wounds which were felt to be pressure. She has dementia when she sits and lies on her bed on her buttocks and according to her husband there is just no way to get her off this. Complicating the issue is she has urinary incontinence and although her husband attempts to change this frequently he often finds the area wet with urine. The patient is nonambulatory. She is in a wheelchair or lying on her back in bed. She does have memory foam over the mattress in the wheelchair. She is here today as she is felt to have new wounds. Our intake and nurse initially did  not notice anything but some excoriation on the skin of her buttocks around the gluteal cleft. Past medical history includes type 2 diabetes, left AKA, urinary incontinence, prior history of left nephrectomy for malignancy, dementia, hypertension, history of PAD. She has been on oxygen for 2 L for about a year. I am not sure what the primary diagnosis is. Patient History Unable to Obtain Patient History due to Dementia. [name], [name] ([id number]) Information obtained from Patient. Allergies morphine, penicillin Family History Cancer - Siblings,Mother, Diabetes - Siblings, No family history of Heart Disease, Hypertension, Kidney Disease, Lung Disease, Seizures, Stroke, Thyroid Problems, Tuberculosis. Social History Former smoker, Marital Status - Married, Alcohol Use - Rarely, Drug Use - No History, Caffeine Use - Daily. Medical History Eyes Patient has history of Cataracts Denies history of Glaucoma, Optic  Neuritis Ear/Nose/Mouth/Throat Denies history of Chronic sinus problems/congestion, Middle ear problems Hematologic/Lymphatic Patient has history of Anemia Denies history of Hemophilia, Human Immunodeficiency Virus, Lymphedema Respiratory Denies history of Aspiration, Asthma, Chronic Obstructive Pulmonary Disease (COPD), Pneumothorax, Sleep Apnea, Tuberculosis Cardiovascular Patient has history of Hypertension, Peripheral Arterial Disease Denies history of Angina, Arrhythmia, Congestive Heart Failure, Coronary Artery Disease, Deep Vein Thrombosis, Hypotension, Myocardial Infarction, Peripheral Venous Disease, Phlebitis, Vasculitis Gastrointestinal Denies history of Cirrhosis , Colitis, Crohn s, Hepatitis A, Hepatitis B, Hepatitis C Endocrine Patient has history of Type II Diabetes Denies history of Type I Diabetes Genitourinary Denies history of End Stage Renal Disease Immunological Denies history of Lupus Erythematosus, Raynaud s, Scleroderma Integumentary  (Skin) Denies history of History of Burn, History of pressure wounds Musculoskeletal Patient has history of Osteoarthritis - hands Denies history of Gout, Rheumatoid Arthritis, Osteomyelitis Neurologic Patient has history of Dementia Denies history of Neuropathy, Quadriplegia, Paraplegia, Seizure Disorder Oncologic Patient has history of Received Radiation Denies history of Received Chemotherapy Psychiatric Denies history of Anorexia/bulimia, Confinement Anxiety Medical And Surgical History Notes Respiratory Nebulizer Genitourinary Left Kidney Removed Oncologic Left Kidney Objective [name], [name] ([id number]) Constitutional Sitting or standing Blood Pressure is within target range for patient.. Pulse regular and within target range for patient.[name] Respirations regular, non- labored and within target range.. Temperature is normal and within the target range for the patient.[name] appears in no distress. Vitals Time Taken: 10:02 AM, Temperature: 98.3 F, Pulse: 83 bpm, Respiratory Rate: 16 breaths/min, Blood Pressure: 129/74 mmHg. Respiratory Oxygen on at 2 L lungs sound fairly clear. Cardiovascular Heart rhythm and rate regular, without murmur or gallop.. Gastrointestinal (GI) Abdomen is soft and non-distended without masses or tenderness. Bowel sounds active in all quadrants.. No liver or spleen enlargement or tenderness.. Genitourinary (GU) Bladder is not distended. Psychiatric Patient appears very anxiouso Depression. General Notes: Wound exam; the patient has open wounds in the continuation of her gluteal cleft towards the coccyx and sacrum. These are linear wounds. There are two linear areas. Firstly inferiorly there is a longer area and then more superiorly a shorter area. Although these are small areas they seem very uncomfortable to the patient. Integumentary (Hair, Skin) Wound #5 status is Open. Original cause of wound was Pressure Injury. The wound is located on the  Midline Gluteus. The wound measures 6.7cm length x 0.5cm width x 0.1cm depth; 2.631cm^2 area and 0.263cm^3 volume. There is Fat Layer (Subcutaneous Tissue) Exposed exposed. There is no tunneling or undermining noted. There is a small amount of serous drainage noted. The wound margin is flat and intact. There is large (67-100%) granulation within the wound bed. There is no necrotic tissue within the wound bed. Assessment Active Problems ICD-10 Pressure ulcer of sacral region, stage 2 Plan Skin Barriers/Peri-Wound Care: Wound #5 Midline Gluteus: Other: - Keep area clean and dry Primary Wound Dressing: Wound #5 Midline Gluteus: Other: - silver cell placed onto wound Dressing Change Frequency: Wound #5 Midline Gluteus: Change dressing every other day. - or if soiled Follow-up Appointments: Wound #5 Midline Gluteus: Return Appointment in 2 weeks. Off-Loading: Wound #5 Midline Gluteus: Turn and reposition every 2 hours - keep pressure off of wounded areas Sprague, Amy [name] L. ([id number]) 1. We will use silver alginate to this area. This will probably need to be changed every day may be more frequently if soiled by urine 2. It is difficult to see how she is getting so much incontinence back in the area where her wounds are. I was wondering if  there is something that can be done about that from a garment point of view 3. The actual area around the gluteal cleft is somewhat irritated probably by pressure and moisture but there was no open wound in this area. 4. Would wonder about depression in this patient with dementia 5. I am not exactly sure why she is on oxygen however she did have that on today. I spent 30 minutes in review of this patient's past medical history, face-to-face evaluation and preparation of this record Electronic Signature(s) Signed: 09/13/2019 7:42:01 AM By: Linton Ham MD Entered By: Linton Ham on 09/12/2019 10:45:57 Amy [name], [name]  ([id number]) -------------------------------------------------------------------------------- ROS/PFSH Details Patient Name: GRIZELDA, PISCOPO L. Date of Service: 09/12/2019 9:45 AM Medical Record Number: [id number] Patient Account Number: [id number] Date of Birth/Sex: 1935/12/26 (84 y.o. Female) Treating RN: Montey Hora Primary Care Provider: Raelyn Ensign Other Clinician: Referring Provider: Referral, Self Treating Provider/Extender: Tito Dine in Treatment: 0 Unable to Obtain Patient History due to oo Dementia Information Obtained From Patient Eyes Medical History: Positive for: Cataracts Negative for: Glaucoma; Optic Neuritis Ear/Nose/Mouth/Throat Medical History: Negative for: Chronic sinus problems/congestion; Middle ear problems Hematologic/Lymphatic Medical History: Positive for: Anemia Negative for: Hemophilia; Human Immunodeficiency Virus; Lymphedema Respiratory Medical History: Negative for: Aspiration; Asthma; Chronic Obstructive Pulmonary Disease (COPD); Pneumothorax; Sleep Apnea; Tuberculosis Past Medical History Notes: Nebulizer Cardiovascular Medical History: Positive for: Hypertension; Peripheral Arterial Disease Negative for: Angina; Arrhythmia; Congestive Heart Failure; Coronary Artery Disease; Deep Vein Thrombosis; Hypotension; Myocardial Infarction; Peripheral Venous Disease; Phlebitis; Vasculitis Gastrointestinal Medical History: Negative for: Cirrhosis ; Colitis; Crohnos; Hepatitis A; Hepatitis B; Hepatitis C Endocrine Medical History: Positive for: Type II Diabetes Negative for: Type I Diabetes Time with diabetes: 7 years Treated with: Diet Blood sugar tested every day: No Genitourinary Medical History: Negative for: End Stage Renal Disease Past Medical History Notes: Left Kidney Removed Immunological Goetting, Claudia L. ([id number]) Medical History: Negative for: Lupus Erythematosus; Raynaudos; Scleroderma Integumentary  (Skin) Medical History: Negative for: History of Burn; History of pressure wounds Musculoskeletal Medical History: Positive for: Osteoarthritis - hands Negative for: Gout; Rheumatoid Arthritis; Osteomyelitis Neurologic Medical History: Positive for: Dementia Negative for: Neuropathy; Quadriplegia; Paraplegia; Seizure Disorder Oncologic Medical History: Positive for: Received Radiation Negative for: Received Chemotherapy Past Medical History Notes: Left Kidney Psychiatric Medical History: Negative for: Anorexia/bulimia; Confinement Anxiety HBO Extended History Items Eyes: Cataracts Immunizations Pneumococcal Vaccine: Received Pneumococcal Vaccination: Yes Immunization Notes: up to date Implantable Devices None Family and Social History Cancer: Yes - Siblings,Mother; Diabetes: Yes - Siblings; Heart Disease: No; Hypertension: No; Kidney Disease: No; Lung Disease: No; Seizures: No; Stroke: No; Thyroid Problems: No; Tuberculosis: No; Former smoker; Marital Status - Married; Alcohol Use: Rarely; Drug Use: No History; Caffeine Use: Daily Electronic Signature(s) Signed: 09/12/2019 4:33:58 PM By: Montey Hora Signed: 09/13/2019 7:42:01 AM By: Linton Ham MD Entered By: Montey Hora on 09/12/2019 09:56:46 Goodenow, Eily L. ([id number]) -------------------------------------------------------------------------------- SuperBill Details Patient Name: TIARIA, BIBY L. Date of Service: 09/12/2019 Medical Record Number: [id number] Patient Account Number: [id number] Date of Birth/Sex: 1935-08-29 (84 y.o. Female) Treating RN: Cornell Barman Primary Care Provider: Raelyn Ensign Other Clinician: Referring Provider: Referral, Self Treating Provider/Extender: Ricard Dillon Weeks in Treatment: 0 Diagnosis Coding ICD-10 Codes Code Description L89.152 Pressure ulcer of sacral region, stage 2 Facility Procedures CPT4 Code: 48185631 Description: 99214 - WOUND CARE VISIT-LEV 4  EST PT Modifier: Quantity: 1 Physician Procedures CPT4 Code: 4970263 Description: 78588 - WC PHYS LEVEL 4 - EST PT Modifier: Quantity: 1 CPT4 Code: Description: ICD-10  Diagnosis Description L89.152 Pressure ulcer of sacral region, stage 2 Modifier: Quantity: Electronic Signature(s) Signed: 09/13/2019 7:42:01 AM By: Linton Ham MD Entered By: Linton Ham on 09/12/2019 10:46:19

## 2019-09-13 NOTE — Progress Notes (Signed)
[name], [name] ([id number]) Visit Report for 09/12/2019 Allergy List Details Patient Name: Amy [name], DOST. Date of Service: 09/12/2019 9:45 AM Medical Record Number: [id number] Patient Account Number: [id number] Date of Birth/Sex: 01-Sep-1935 (83 y.o. Female) Treating RN: Montey Hora Primary Care Zaydin Billey: Raelyn Ensign Other Clinician: Referring Chene Kasinger: Referral, Self Treating Latron Ribas/Extender: Ricard Dillon Weeks in Treatment: 0 Allergies Active Allergies morphine penicillin Allergy Notes Electronic Signature(s) Signed: 09/12/2019 4:33:58 PM By: Montey Hora Entered By: Montey Hora on 09/12/2019 09:56:23 Stick, [name] ([id number]) -------------------------------------------------------------------------------- Arrival Information Details Patient Name: Amy [name], [name] Date of Service: 09/12/2019 9:45 AM Medical Record Number: [id number] Patient Account Number: [id number] Date of Birth/Sex: [birth year] (84 y.o. Female) Treating RN: Montey Hora Primary Care Iyanla Eilers: Raelyn Ensign Other Clinician: Referring Anistyn Graddy: Referral, Self Treating Lue [place]/Extender: Tito Dine in Treatment: 0 Visit Information Patient Arrived: Wheel Chair Arrival Time: 09:55 Accompanied By: husband Transfer Assistance: Manual Patient Identification Verified: Yes Secondary Verification Process Completed: Yes History Since Last Visit Added or deleted any medications: No Any new allergies or adverse reactions: No Had a fall or experienced change in activities of daily living that may affect risk of falls: No Signs or symptoms of abuse/neglect since last visito No Hospitalized since last visit: No Implantable device outside of the clinic excluding cellular tissue based products placed in the center since last visit: No Electronic Signature(s) Signed: 09/12/2019 4:33:58 PM By: Montey Hora Entered By: Montey Hora on 09/12/2019 09:56:17 [name],  [name] ([id number]) -------------------------------------------------------------------------------- Clinic Amy of Care Assessment Details Patient Name: Amy [name], [name] Date of Service: 09/12/2019 9:45 AM Medical Record Number: [id number] Patient Account Number: [id number] Date of Birth/Sex: [birth year] (84 y.o. Female) Treating RN: Cornell Barman Primary Care Jordynn Perrier: Raelyn Ensign Other Clinician: Referring Jerusalem Brownstein: Referral, Self Treating Keelie Zemanek/Extender: Tito Dine in Treatment: 0 Clinic Amy of Care Assessment Items TOOL 2 Quantity Score []  - Use when only an EandM is performed on the INITIAL visit 0 ASSESSMENTS - Nursing Assessment / Reassessment X - General Physical Exam (combine w/ comprehensive assessment (listed just below) when performed on new pt. 1 20 evals) X- 1 25 Comprehensive Assessment (HX, ROS, Risk Assessments, Wounds Hx, etc.) ASSESSMENTS - Wound and Skin Assessment / Reassessment X - Simple Wound Assessment / Reassessment - one wound 1 5 []  - 0 Complex Wound Assessment / Reassessment - multiple wounds []  - 0 Dermatologic / Skin Assessment (not related to wound area) ASSESSMENTS - Ostomy and/or Continence Assessment and Care X - Incontinence Assessment and Management 1 10 []  - 0 Ostomy Care Assessment and Management (repouching, etc.) PROCESS - Coordination of Care X - Simple Patient / Family Education for ongoing care 1 15 []  - 0 Complex (extensive) Patient / Family Education for ongoing care X- 1 10 Staff obtains Programmer, systems, Records, Test Results / Process Orders []  - 0 Staff telephones HHA, Nursing Homes / Clarify orders / etc []  - 0 Routine Transfer to another Facility (non-emergent condition) []  - 0 Routine Hospital Admission (non-emergent condition) X- 1 15 New Admissions / Biomedical engineer / Ordering NPWT, Apligraf, etc. []  - 0 Emergency Hospital Admission (emergent condition) X- 1 10 Simple Discharge  Coordination []  - 0 Complex (extensive) Discharge Coordination PROCESS - Special Needs []  - Pediatric / Minor Patient Management 0 []  - 0 Isolation Patient Management []  - 0 Hearing / Language / Visual special needs []  - 0 Assessment of Community assistance (transportation, D/C planning, etc.) []  - 0 Additional assistance / Altered mentation []  - 0  Support Surface(s) Assessment (bed, cushion, seat, etc.) INTERVENTIONS - Wound Cleansing / Measurement X - Wound Imaging (photographs - any number of wounds) 1 5 [name], [name] ([id number]) []  - 0 Wound Tracing (instead of photographs) X- 1 5 Simple Wound Measurement - one wound []  - 0 Complex Wound Measurement - multiple wounds X- 1 5 Simple Wound Cleansing - one wound []  - 0 Complex Wound Cleansing - multiple wounds INTERVENTIONS - Wound Dressings X - Small Wound Dressing one or multiple wounds 1 10 []  - 0 Medium Wound Dressing one or multiple wounds []  - 0 Large Wound Dressing one or multiple wounds []  - 0 Application of Medications - injection INTERVENTIONS - Miscellaneous []  - External ear exam 0 []  - 0 Specimen Collection (cultures, biopsies, blood, body fluids, etc.) []  - 0 Specimen(s) / Culture(s) sent or taken to Lab for analysis X- 1 10 Patient Transfer (multiple staff / Civil Service fast streamer / Similar devices) []  - 0 Simple Staple / Suture removal (25 or less) []  - 0 Complex Staple / Suture removal (26 or more) []  - 0 Hypo / Hyperglycemic Management (close monitor of Blood Glucose) []  - 0 Ankle / Brachial Index (ABI) - do not check if billed separately Has the patient been seen at the hospital within the last three years: Yes Total Score: 145 Amy Of Care: New/Established - Amy 4 Electronic Signature(s) Signed: 09/13/2019 5:10:21 PM By: Gretta Cool, BSN, RN, CWS, Kim RN, BSN Entered By: Gretta Cool, BSN, RN, CWS, Kim on 09/12/2019 10:33:41 [name], [name]  ([id number]) -------------------------------------------------------------------------------- Encounter Discharge Information Details Patient Name: [name], [name] Date of Service: 09/12/2019 9:45 AM Medical Record Number: [id number] Patient Account Number: [id number] Date of Birth/Sex: 05-10-1936 (84 y.o. Female) Treating RN: Cornell Barman Primary Care Janesa Dockery: Raelyn Ensign Other Clinician: Referring Tareq Dwan: Referral, Self Treating Kayana Thoen/Extender: Tito Dine in Treatment: 0 Encounter Discharge Information Items Discharge Condition: Stable Ambulatory Status: Wheelchair Discharge Destination: Home Transportation: Private Auto Accompanied By: self Schedule Follow-up Appointment: Yes Clinical Summary of Care: Electronic Signature(s) Signed: 09/13/2019 5:10:21 PM By: Gretta Cool, BSN, RN, CWS, Kim RN, BSN Entered By: Gretta Cool, BSN, RN, CWS, Kim on 09/12/2019 10:34:55 Dalsanto, Brityn L. ([id number]) -------------------------------------------------------------------------------- Lower Extremity Assessment Details Patient Name: Amy [name], [name] Date of Service: 09/12/2019 9:45 AM Medical Record Number: [id number] Patient Account Number: [id number] Date of Birth/Sex: January 09, 1936 (83 y.o. Female) Treating RN: Montey Hora Primary Care Ameirah Khatoon: Raelyn Ensign Other Clinician: Referring Abdiel Blackerby: Referral, Self Treating Stephana Morell/Extender: Ricard Dillon Weeks in Treatment: 0 Electronic Signature(s) Signed: 09/12/2019 4:33:58 PM By: Montey Hora Entered By: Montey Hora on 09/12/2019 10:02:53 Calabria, Pebbles L. ([id number]) -------------------------------------------------------------------------------- Multi Wound Chart Details Patient Name: Amy [name], [name] Date of Service: 09/12/2019 9:45 AM Medical Record Number: [id number] Patient Account Number: [id number] Date of Birth/Sex: 1935-08-11 (84 y.o. Female) Treating RN: Cornell Barman Primary Care Cianna Kasparian:  Raelyn Ensign Other Clinician: Referring Kiaria Quinnell: Referral, Self Treating Luma Clopper/Extender: Tito Dine in Treatment: 0 Vital Signs Height(in): Pulse(bpm): 9 Weight(lbs): Blood Pressure(mmHg): 129/74 Body Mass Index(BMI): Temperature(F): 98.3 Respiratory Rate(breaths/min): 16 Photos: [N/A:N/A] Wound Location: Midline Gluteus N/A N/A Wounding Event: Pressure Injury N/A N/A Primary Etiology: Pressure Ulcer N/A N/A Secondary Etiology: MASD N/A N/A Comorbid History: Cataracts, Anemia, Hypertension, N/A N/A Peripheral Arterial Disease, Type II Diabetes, Osteoarthritis, Dementia, Received Radiation Date Acquired: 08/20/2019 N/A N/A Weeks of Treatment: 0 N/A N/A Wound Status: Open N/A N/A Clustered Wound: Yes N/A N/A Clustered Quantity: 2 N/A N/A Measurements L x W x D (cm) 6.7x0.5x0.1 N/A N/A  Area (cm) : 2.631 N/A N/A Volume (cm) : 0.263 N/A N/A % Reduction in Area: 0.00% N/A N/A % Reduction in Volume: 0.00% N/A N/A Classification: Category/Stage II N/A N/A Exudate Amount: Small N/A N/A Exudate Type: Serous N/A N/A Exudate Color: amber N/A N/A Wound Margin: Flat and Intact N/A N/A Granulation Amount: Large (67-100%) N/A N/A Necrotic Amount: None Present (0%) N/A N/A Exposed Structures: Fat Layer (Subcutaneous Tissue) N/A N/A Exposed: Yes Fascia: No Tendon: No Muscle: No Joint: No Bone: No Epithelialization: Medium (34-66%) N/A N/A Treatment Notes Electronic Signature(s) Connelly, Iasia L. ([id number]) Signed: 09/13/2019 7:42:01 AM By: Linton Ham MD Entered By: Linton Ham on 09/12/2019 10:31:54 Panetta, Taite L. ([id number]) -------------------------------------------------------------------------------- [place] Details Patient Name: Amy [name], PALENCIA. Date of Service: 09/12/2019 9:45 AM Medical Record Number: [id number] Patient Account Number: [id number] Date of Birth/Sex: 08-Aug-1935 (84 y.o. Female) Treating RN:  Cornell Barman Primary Care Adarian Bur: Raelyn Ensign Other Clinician: Referring Aiman Sonn: Referral, Self Treating Azul Coffie/Extender: Tito Dine in Treatment: 0 Active Inactive Orientation to the Wound Care Program Nursing Diagnoses: Knowledge deficit related to the wound healing center program Goals: Patient/caregiver will verbalize understanding of the [place] Program Date Initiated: 09/12/2019 Target Resolution Date: 09/12/2019 Goal Status: Active Interventions: Provide education on orientation to the wound center Notes: Pressure Nursing Diagnoses: Knowledge deficit related to causes and risk factors for pressure ulcer development Goals: Patient will remain free of pressure ulcers Date Initiated: 09/12/2019 Target Resolution Date: 08/27/2019 Goal Status: Active Interventions: Assess: immobility, friction, shearing, incontinence upon admission and as needed Assess offloading mechanisms upon admission and as needed Notes: Wound/Skin Impairment Nursing Diagnoses: Impaired tissue integrity Goals: Ulcer/skin breakdown will have a volume reduction of 30% by week 4 Date Initiated: 09/12/2019 Target Resolution Date: 10/12/2019 Goal Status: Active Interventions: Assess ulceration(s) every visit Treatment Activities: Topical wound management initiated : 09/12/2019 Notes: Bielicki, Tatyana L. ([id number]) Electronic Signature(s) Signed: 09/13/2019 5:10:21 PM By: Gretta Cool, BSN, RN, CWS, Kim RN, BSN Entered By: Gretta Cool, BSN, RN, CWS, Kim on 09/12/2019 10:25:47 Bing, Wanisha L. ([id number]) -------------------------------------------------------------------------------- Non-Wound Condition Assessment Details Patient Name: Amy [name], [name] Date of Service: 09/12/2019 9:45 AM Medical Record Number: [id number] Patient Account Number: [id number] Date of Birth/Sex: [birth year] (84 y.o. Female) Treating RN: Montey Hora Primary Care Darian Cansler: Raelyn Ensign Other  Clinician: Referring Marlene Beidler: Referral, Self Treating Amatullah Christy/Extender: Ricard Dillon Weeks in Treatment: 0 Non-Wound Condition: Condition: Rash / Dermatitis Location: Gluteus Side: Bilateral Photos Notes skin irritation Electronic Signature(s) Signed: 09/12/2019 4:33:58 PM By: Montey Hora Entered By: Montey Hora on 09/12/2019 10:08:49 Achenbach, Amori L. ([id number]) -------------------------------------------------------------------------------- Pain Assessment Details Patient Name: Amy [name], [name] Date of Service: 09/12/2019 9:45 AM Medical Record Number: [id number] Patient Account Number: [id number] Date of Birth/Sex: 02/21/1936 (84 y.o. Female) Treating RN: Montey Hora Primary Care Hyder Deman: Raelyn Ensign Other Clinician: Referring Makinzi Prieur: Referral, Self Treating Carolyne Whitsel/Extender: Tito Dine in Treatment: 0 Active Problems Location of Pain Severity and Description of Pain Patient Has Paino Yes Site Locations Pain Location: Pain in Ulcers With Dressing Change: Yes Duration of the Pain. Constant / Intermittento Intermittent Pain Management and Medication Current Pain Management: Electronic Signature(s) Signed: 09/12/2019 4:33:58 PM By: Montey Hora Entered By: Montey Hora on 09/12/2019 10:02:21 Metzgar, Janera L. ([id number]) -------------------------------------------------------------------------------- Patient/Caregiver Education Details Patient Name: Amy [name], [name] Date of Service: 09/12/2019 9:45 AM Medical Record Number: [id number] Patient Account Number: [id number] Date of Birth/Gender: 04/09/1936 (84 y.o. Female) Treating RN: Cornell Barman Primary Care Physician: Raelyn Ensign Other Clinician: Referring Physician:  Referral, Self Treating Physician/Extender: Tito Dine in Treatment: 0 Education Assessment Education Provided To: Patient Education Topics Provided Wound/Skin  Impairment: Handouts: Caring for Your Ulcer, Other: wound care as prescibed Methods: Demonstration Responses: State content correctly Electronic Signature(s) Signed: 09/13/2019 5:10:21 PM By: Gretta Cool, BSN, RN, CWS, Kim RN, BSN Entered By: Gretta Cool, BSN, RN, CWS, Kim on 09/12/2019 10:34:05 Herzig, Joyceline L. ([id number]) -------------------------------------------------------------------------------- Wound Assessment Details Patient Name: Amy [name], [name] Date of Service: 09/12/2019 9:45 AM Medical Record Number: [id number] Patient Account Number: [id number] Date of Birth/Sex: [birth year] (84 y.o. Female) Treating RN: Cornell Barman Primary Care Ramya Vanbergen: Raelyn Ensign Other Clinician: Referring Jaquala Fuller: Referral, Self Treating Santosh Petter/Extender: Ricard Dillon Weeks in Treatment: 0 Wound Status Wound Number: 5 Primary Pressure Ulcer Etiology: Wound Location: Midline Gluteus Secondary MASD Wounding Event: Pressure Injury Etiology: Date Acquired: 08/20/2019 Wound Open Weeks Of Treatment: 0 Status: Clustered Wound: Yes Comorbid Cataracts, Anemia, Hypertension, Peripheral Arterial History: Disease, Type II Diabetes, Osteoarthritis, Dementia, Received Radiation Photos Wound Measurements Length: (cm) 6.7 % R Width: (cm) 0.5 % R Depth: (cm) 0.1 Epi Clustered Quantity: 2 Tun Area: (cm) 2.631 Un Volume: (cm) 0.263 eduction in Area: 0% eduction in Volume: 0% thelialization: Medium (34-66%) neling: No dermining: No Wound Description Classification: Category/Stage II Fou Wound Margin: Flat and Intact Slo Exudate Amount: Small Exudate Type: Serous Exudate Color: amber l Odor After Cleansing: No ugh/Fibrino No Wound Bed Granulation Amount: Large (67-100%) Exposed Structure Necrotic Amount: None Present (0%) Fascia Exposed: No Fat Layer (Subcutaneous Tissue) Exposed: Yes Tendon Exposed: No Muscle Exposed: No Joint Exposed: No Bone Exposed: No Treatment Notes Wound #5  (Midline Gluteus) Notes Amy [name], [name] ([id number]) Silver cell on open areas Electronic Signature(s) Signed: 09/13/2019 5:10:21 PM By: Gretta Cool, BSN, RN, CWS, Kim RN, BSN Entered By: Gretta Cool, BSN, RN, CWS, Kim on 09/12/2019 10:27:07 Ennen, Azarie L. ([id number]) -------------------------------------------------------------------------------- Vitals Details Patient Name: Amy [name], [name] Date of Service: 09/12/2019 9:45 AM Medical Record Number: [id number] Patient Account Number: [id number] Date of Birth/Sex: 09/15/1935 (84 y.o. Female) Treating RN: Montey Hora Primary Care Furkan Keenum: Raelyn Ensign Other Clinician: Referring Haytham Maher: Referral, Self Treating Marcea Rojek/Extender: Tito Dine in Treatment: 0 Vital Signs Time Taken: 10:02 Temperature (F): 98.3 Pulse (bpm): 83 Respiratory Rate (breaths/min): 16 Blood Pressure (mmHg): 129/74 Reference Range: 80 - 120 mg / dl Electronic Signature(s) Signed: 09/12/2019 4:33:58 PM By: Montey Hora Entered By: Montey Hora on 09/12/2019 10:02:45

## 2019-09-14 ENCOUNTER — Ambulatory Visit: Payer: Self-pay | Admitting: *Deleted

## 2019-09-14 DIAGNOSIS — G301 Alzheimer's disease with late onset: Secondary | ICD-10-CM

## 2019-09-14 DIAGNOSIS — F02818 Dementia in other diseases classified elsewhere, unspecified severity, with other behavioral disturbance: Secondary | ICD-10-CM

## 2019-09-14 DIAGNOSIS — J449 Chronic obstructive pulmonary disease, unspecified: Secondary | ICD-10-CM

## 2019-09-14 NOTE — Chronic Care Management (AMB) (Signed)
Chronic Care Management    Clinical Social Work Follow Up Note  09/14/2019 Name: Amy [name] MRN: [id number] DOB: 1935/10/28  Amy [name] is a 84 y.o. year old female who is a primary care patient of [name], [place]. The CCM team was consulted for assistance with Level of Care Concerns.   Review of patient status, including review of consultants reports, other relevant assessments, and collaboration with appropriate care team members and the patient's provider was performed as part of comprehensive patient evaluation and provision of chronic care management services.    SDOH (Social Determinants of Health) assessments performed: No    Outpatient Encounter Medications as of 09/14/2019  Medication Sig  . acetaminophen (TYLENOL) 325 MG tablet Take 325 mg by mouth every 6 (six) hours as needed.  [name] albuterol (VENTOLIN HFA) 108 (90 Base) MCG/ACT inhaler Inhale 1-2 puffs into the lungs every 4 (four) hours as needed.   [name] atorvastatin (LIPITOR) 20 MG tablet Take 1 tablet (20 mg total) by mouth daily at 6 PM.  . clopidogrel (PLAVIX) 75 MG tablet TAKE 1 TABLET EVERY DAY  . Cyanocobalamin (VITAMIN B12 PO) Take by mouth.  . donepezil (ARICEPT) 10 MG tablet Take 1 tablet (10 mg total) by mouth at bedtime.  . famotidine (PEPCID) 20 MG tablet Take 1 tablet (20 mg total) by mouth daily. (Patient not taking: Reported on 09/10/2019)  . ferrous sulfate 325 (65 FE) MG tablet Take 325 mg by mouth daily with breakfast. Pt taking every other day  . FLUoxetine (PROZAC) 10 MG capsule Take 1 capsule (10 mg total) by mouth daily.  [name] gabapentin (NEURONTIN) 300 MG capsule Take 1 capsule (300 mg total) by mouth 3 (three) times daily.  [name] HYDROcodone-acetaminophen (NORCO) 7.5-325 MG tablet Take 1 tablet by mouth every 8 (eight) hours as needed for moderate pain.  [name] ON 09/25/2019] HYDROcodone-acetaminophen (NORCO) 7.5-325 MG tablet Take 1 tablet by mouth every 8 (eight) hours as needed for moderate  pain.  [name] ipratropium-albuterol (DUONEB) 0.5-2.5 (3) MG/3ML SOLN INHALE THE CONTENTS OF 1 VIAL VIA NEBULIZER THREE TIMES DAILY AS DIRECTED  . pantoprazole (PROTONIX) 40 MG tablet TAKE 1 TABLET BY MOUTH DAILY  . QUEtiapine (SEROQUEL) 25 MG tablet Take 25 mg by mouth 2 (two) times daily. Takes at 2 pm and Total 75 mg a hs.  . QUEtiapine (SEROQUEL) 50 MG tablet Take 50 mg by mouth at bedtime.  . Thiamine HCl (VITAMIN B-1) 250 MG tablet Take 250 mg by mouth daily.   No facility-administered encounter medications on file as of 09/14/2019.     Goals Addressed            This Visit's Progress   . "I think I need to look in to long term care for my wife" (pt-stated)       [facility] (see longitudinal plan of care for additional care plan information)  Current Barriers:  . Level of care concerns  Clinical Social Work Clinical Goal(s):  [name] Over the next 90 days, patient's spouse will work with SW to address concerns related to patient's level of care needs  Interventions: . Collaboration phone call to the [employer], Education officer, museum with Palliative [facility] . Discussed spouses request for assistance in identifying out of home placement . Discussed planned appointment for patient's spouse to meet with an Lenna Sciara on 54/21 at 56 am . Explored facility care options and was provided with a list of memory care facilities to follow up  on. . This social worker will plan to follow up with patient's spouse following his appointment with the [employer] . This social worker will continue to collaborate with the Palliative care Team-[facility]   Patient Self Care Activities:  . Attends all scheduled provider appointments . Unable to perform ADLs independently . Unable to perform IADLs independently . Knowledge deficit of the out of home placement process  Please see past updates related to this goal by clicking on the "Past Updates" button in the selected goal           Follow Up Plan: SW will follow up with patient by phone over the next 2 weeks   [name], [name]  [name] [phone]

## 2019-09-17 DIAGNOSIS — J441 Chronic obstructive pulmonary disease with (acute) exacerbation: Secondary | ICD-10-CM | POA: Diagnosis not present

## 2019-09-17 DIAGNOSIS — E782 Mixed hyperlipidemia: Secondary | ICD-10-CM | POA: Diagnosis not present

## 2019-09-17 DIAGNOSIS — I208 Other forms of angina pectoris: Secondary | ICD-10-CM | POA: Diagnosis not present

## 2019-09-17 DIAGNOSIS — E119 Type 2 diabetes mellitus without complications: Secondary | ICD-10-CM | POA: Diagnosis not present

## 2019-09-17 DIAGNOSIS — I739 Peripheral vascular disease, unspecified: Secondary | ICD-10-CM | POA: Diagnosis not present

## 2019-09-17 DIAGNOSIS — N184 Chronic kidney disease, stage 4 (severe): Secondary | ICD-10-CM | POA: Diagnosis not present

## 2019-09-17 DIAGNOSIS — R011 Cardiac murmur, unspecified: Secondary | ICD-10-CM | POA: Diagnosis not present

## 2019-09-17 DIAGNOSIS — R0602 Shortness of breath: Secondary | ICD-10-CM | POA: Diagnosis not present

## 2019-09-18 ENCOUNTER — Telehealth: Payer: Self-pay

## 2019-09-22 DIAGNOSIS — N189 Chronic kidney disease, unspecified: Secondary | ICD-10-CM | POA: Diagnosis not present

## 2019-09-22 DIAGNOSIS — I129 Hypertensive chronic kidney disease with stage 1 through stage 4 chronic kidney disease, or unspecified chronic kidney disease: Secondary | ICD-10-CM | POA: Diagnosis not present

## 2019-09-22 DIAGNOSIS — L98411 Non-pressure chronic ulcer of buttock limited to breakdown of skin: Secondary | ICD-10-CM | POA: Diagnosis not present

## 2019-09-22 DIAGNOSIS — F0390 Unspecified dementia without behavioral disturbance: Secondary | ICD-10-CM | POA: Diagnosis not present

## 2019-09-22 DIAGNOSIS — Z4781 Encounter for orthopedic aftercare following surgical amputation: Secondary | ICD-10-CM | POA: Diagnosis not present

## 2019-09-22 DIAGNOSIS — I70209 Unspecified atherosclerosis of native arteries of extremities, unspecified extremity: Secondary | ICD-10-CM | POA: Diagnosis not present

## 2019-09-22 DIAGNOSIS — E1151 Type 2 diabetes mellitus with diabetic peripheral angiopathy without gangrene: Secondary | ICD-10-CM | POA: Diagnosis not present

## 2019-09-22 DIAGNOSIS — D631 Anemia in chronic kidney disease: Secondary | ICD-10-CM | POA: Diagnosis not present

## 2019-09-22 DIAGNOSIS — E1136 Type 2 diabetes mellitus with diabetic cataract: Secondary | ICD-10-CM | POA: Diagnosis not present

## 2019-09-22 DIAGNOSIS — E1122 Type 2 diabetes mellitus with diabetic chronic kidney disease: Secondary | ICD-10-CM | POA: Diagnosis not present

## 2019-09-24 DIAGNOSIS — N2581 Secondary hyperparathyroidism of renal origin: Secondary | ICD-10-CM | POA: Diagnosis not present

## 2019-09-24 DIAGNOSIS — N1832 Chronic kidney disease, stage 3b: Secondary | ICD-10-CM | POA: Diagnosis not present

## 2019-09-24 DIAGNOSIS — I129 Hypertensive chronic kidney disease with stage 1 through stage 4 chronic kidney disease, or unspecified chronic kidney disease: Secondary | ICD-10-CM | POA: Diagnosis not present

## 2019-09-24 DIAGNOSIS — R809 Proteinuria, unspecified: Secondary | ICD-10-CM | POA: Diagnosis not present

## 2019-09-24 DIAGNOSIS — D631 Anemia in chronic kidney disease: Secondary | ICD-10-CM | POA: Diagnosis not present

## 2019-09-24 DIAGNOSIS — E119 Type 2 diabetes mellitus without complications: Secondary | ICD-10-CM | POA: Diagnosis not present

## 2019-09-24 DIAGNOSIS — E1122 Type 2 diabetes mellitus with diabetic chronic kidney disease: Secondary | ICD-10-CM | POA: Diagnosis not present

## 2019-09-26 ENCOUNTER — Encounter: Payer: Medicare HMO | Attending: Internal Medicine | Admitting: Internal Medicine

## 2019-09-26 ENCOUNTER — Other Ambulatory Visit: Payer: Self-pay

## 2019-09-26 DIAGNOSIS — Z905 Acquired absence of kidney: Secondary | ICD-10-CM | POA: Diagnosis not present

## 2019-09-26 DIAGNOSIS — I129 Hypertensive chronic kidney disease with stage 1 through stage 4 chronic kidney disease, or unspecified chronic kidney disease: Secondary | ICD-10-CM | POA: Diagnosis not present

## 2019-09-26 DIAGNOSIS — I739 Peripheral vascular disease, unspecified: Secondary | ICD-10-CM | POA: Diagnosis not present

## 2019-09-26 DIAGNOSIS — Z89612 Acquired absence of left leg above knee: Secondary | ICD-10-CM | POA: Diagnosis not present

## 2019-09-26 DIAGNOSIS — Z923 Personal history of irradiation: Secondary | ICD-10-CM | POA: Insufficient documentation

## 2019-09-26 DIAGNOSIS — Z86718 Personal history of other venous thrombosis and embolism: Secondary | ICD-10-CM | POA: Insufficient documentation

## 2019-09-26 DIAGNOSIS — Z9981 Dependence on supplemental oxygen: Secondary | ICD-10-CM | POA: Diagnosis not present

## 2019-09-26 DIAGNOSIS — E1122 Type 2 diabetes mellitus with diabetic chronic kidney disease: Secondary | ICD-10-CM | POA: Insufficient documentation

## 2019-09-26 DIAGNOSIS — L89152 Pressure ulcer of sacral region, stage 2: Secondary | ICD-10-CM | POA: Diagnosis not present

## 2019-09-26 DIAGNOSIS — Z85528 Personal history of other malignant neoplasm of kidney: Secondary | ICD-10-CM | POA: Insufficient documentation

## 2019-09-26 DIAGNOSIS — Z993 Dependence on wheelchair: Secondary | ICD-10-CM | POA: Diagnosis not present

## 2019-09-26 DIAGNOSIS — N189 Chronic kidney disease, unspecified: Secondary | ICD-10-CM | POA: Insufficient documentation

## 2019-09-27 NOTE — Progress Notes (Signed)
Buerger, Tyree L. ([id number]) Visit Report for 09/26/2019 Arrival Information Details Patient Name: Amy [name], Amy [name]. Date of Service: 09/26/2019 8:45 AM Medical Record Number: [id number] Patient Account Number: [id number] Date of Birth/Sex: [birth year] (84 y.o. F) Treating RN: Montey Hora Primary Care Tamkia Temples: Raelyn Ensign Other Clinician: Referring Shaquavia Whisonant: Raelyn Ensign Treating Makela Niehoff/Extender: Tito Dine in Treatment: 2 Visit Information History Since Last Visit Added or deleted any medications: No Patient Arrived: Wheel Chair Any new allergies or adverse reactions: No Arrival Time: 08:56 Had a fall or experienced change in No Accompanied By: husband activities of daily living that may affect Transfer Assistance: Manual risk of falls: Patient Identification Verified: Yes Signs or symptoms of abuse/neglect since last visito No Secondary Verification Process Completed: Yes Hospitalized since last visit: No Implantable device outside of the clinic excluding No cellular tissue based products placed in the center since last visit: Has Dressing in Place as Prescribed: Yes Pain Present Now: No Electronic Signature(s) Signed: 09/26/2019 4:27:51 PM By: Montey Hora Entered By: Montey Hora on 09/26/2019 08:57:09 Brucker, Sadeen L. ([id number]) -------------------------------------------------------------------------------- Clinic Level of Care Assessment Details Patient Name: Amy [name], [name] Date of Service: 09/26/2019 8:45 AM Medical Record Number: [id number] Patient Account Number: [id number] Date of Birth/Sex: 03/19/1936 (84 y.o. F) Treating RN: Cornell Barman Primary Care Lashaun Krapf: Raelyn Ensign Other Clinician: Referring Eyva Califano: Raelyn Ensign Treating Necole Minassian/Extender: Tito Dine in Treatment: 2 Clinic Level of Care Assessment Items TOOL 4 Quantity Score []  - Use when only an EandM is performed on FOLLOW-UP visit 0 ASSESSMENTS -  Nursing Assessment / Reassessment X - Reassessment of Co-morbidities (includes updates in patient status) 1 10 X- 1 5 Reassessment of Adherence to Treatment Plan ASSESSMENTS - Wound and Skin Assessment / Reassessment X - Simple Wound Assessment / Reassessment - one wound 1 5 []  - 0 Complex Wound Assessment / Reassessment - multiple wounds []  - 0 Dermatologic / Skin Assessment (not related to wound area) ASSESSMENTS - Focused Assessment []  - Circumferential Edema Measurements - multi extremities 0 []  - 0 Nutritional Assessment / Counseling / Intervention []  - 0 Lower Extremity Assessment (monofilament, tuning fork, pulses) []  - 0 Peripheral Arterial Disease Assessment (using hand held doppler) ASSESSMENTS - Ostomy and/or Continence Assessment and Care []  - Incontinence Assessment and Management 0 []  - 0 Ostomy Care Assessment and Management (repouching, etc.) PROCESS - Coordination of Care X - Simple Patient / Family Education for ongoing care 1 15 []  - 0 Complex (extensive) Patient / Family Education for ongoing care []  - 0 Staff obtains Programmer, systems, Records, Test Results / Process Orders []  - 0 Staff telephones HHA, Nursing Homes / Clarify orders / etc []  - 0 Routine Transfer to another Facility (non-emergent condition) []  - 0 Routine Hospital Admission (non-emergent condition) []  - 0 New Admissions / Biomedical engineer / Ordering NPWT, Apligraf, etc. []  - 0 Emergency Hospital Admission (emergent condition) X- 1 10 Simple Discharge Coordination []  - 0 Complex (extensive) Discharge Coordination PROCESS - Special Needs []  - Pediatric / Minor Patient Management 0 []  - 0 Isolation Patient Management []  - 0 Hearing / Language / Visual special needs []  - 0 Assessment of Community assistance (transportation, D/C planning, etc.) []  - 0 Additional assistance / Altered mentation []  - 0 Support Surface(s) Assessment (bed, cushion, seat, etc.) INTERVENTIONS - Wound  Cleansing / Measurement Rayle, Belkys L. ([id number]) X- 1 5 Simple Wound Cleansing - one wound []  - 0 Complex Wound Cleansing - multiple wounds X- 1 5 Wound Imaging (  photographs - any number of wounds) []  - 0 Wound Tracing (instead of photographs) X- 1 5 Simple Wound Measurement - one wound []  - 0 Complex Wound Measurement - multiple wounds INTERVENTIONS - Wound Dressings X - Small Wound Dressing one or multiple wounds 1 10 []  - 0 Medium Wound Dressing one or multiple wounds []  - 0 Large Wound Dressing one or multiple wounds []  - 0 Application of Medications - topical []  - 0 Application of Medications - injection INTERVENTIONS - Miscellaneous []  - External ear exam 0 []  - 0 Specimen Collection (cultures, biopsies, blood, body fluids, etc.) []  - 0 Specimen(s) / Culture(s) sent or taken to Lab for analysis []  - 0 Patient Transfer (multiple staff / Civil Service fast streamer / Similar devices) []  - 0 Simple Staple / Suture removal (25 or less) []  - 0 Complex Staple / Suture removal (26 or more) []  - 0 Hypo / Hyperglycemic Management (close monitor of Blood Glucose) []  - 0 Ankle / Brachial Index (ABI) - do not check if billed separately X- 1 5 Vital Signs Has the patient been seen at the hospital within the last three years: Yes Total Score: 75 Level Of Care: New/Established - Level 2 Electronic Signature(s) Signed: 09/26/2019 5:22:43 PM By: Gretta Cool, BSN, RN, CWS, Kim RN, BSN Entered By: Gretta Cool, BSN, RN, CWS, Kim on 09/26/2019 09:13:56 Pisani, Lashica L. ([id number]) -------------------------------------------------------------------------------- Encounter Discharge Information Details Patient Name: Amy [name], [name] Date of Service: 09/26/2019 8:45 AM Medical Record Number: [id number] Patient Account Number: [id number] Date of Birth/Sex: January 29, 1936 (84 y.o. F) Treating RN: Cornell Barman Primary Care Shyler Holzman: Raelyn Ensign Other Clinician: Referring Jadarious Dobbins: Raelyn Ensign Treating Melisha Eggleton/Extender: Tito Dine in Treatment: 2 Encounter Discharge Information Items Discharge Condition: Stable Ambulatory Status: Ambulatory Discharge Destination: Home Transportation: Private Auto Accompanied By: self Schedule Follow-up Appointment: Yes Clinical Summary of Care: Electronic Signature(s) Signed: 09/26/2019 5:22:43 PM By: Gretta Cool, BSN, RN, CWS, Kim RN, BSN Entered By: Gretta Cool, BSN, RN, CWS, Kim on 09/26/2019 09:15:02 Herrig, Wende L. ([id number]) -------------------------------------------------------------------------------- Lower Extremity Assessment Details Patient Name: Amy [name], [name] Date of Service: 09/26/2019 8:45 AM Medical Record Number: [id number] Patient Account Number: [id number] Date of Birth/Sex: [birth year] (84 y.o. F) Treating RN: Montey Hora Primary Care Chaos Carlile: Raelyn Ensign Other Clinician: Referring Shalaine Payson: Raelyn Ensign Treating Terisha Losasso/Extender: Tito Dine in Treatment: 2 Electronic Signature(s) Signed: 09/26/2019 4:27:51 PM By: Montey Hora Entered By: Montey Hora on 09/26/2019 08:57:42 Mcwhirt, Delbra L. ([id number]) -------------------------------------------------------------------------------- Multi Wound Chart Details Patient Name: Amy [name], [name] Date of Service: 09/26/2019 8:45 AM Medical Record Number: [id number] Patient Account Number: [id number] Date of Birth/Sex: 14-Nov-1935 (84 y.o. F) Treating RN: Cornell Barman Primary Care Kristeen Lantz: Raelyn Ensign Other Clinician: Referring Jolyne Laye: Raelyn Ensign Treating Ishi Danser/Extender: Tito Dine in Treatment: 2 Vital Signs Height(in): Pulse(bpm): 42 Weight(lbs): Blood Pressure(mmHg): 114/71 Body Mass Index(BMI): Temperature(F): 98.1 Respiratory Rate(breaths/min): 16 Photos: [N/A:N/A] Wound Location: Midline Gluteus N/A N/A Wounding Event: Pressure Injury N/A N/A Primary Etiology: Pressure Ulcer N/A  N/A Secondary Etiology: MASD N/A N/A Comorbid History: Cataracts, Anemia, Hypertension, N/A N/A Peripheral Arterial Disease, Type II Diabetes, Osteoarthritis, Dementia, Received Radiation Date Acquired: 08/20/2019 N/A N/A Weeks of Treatment: 2 N/A N/A Wound Status: Open N/A N/A Clustered Wound: Yes N/A N/A Clustered Quantity: 1 N/A N/A Measurements L x W x D (cm) 0.5x0.1x0.1 N/A N/A Area (cm) : 0.039 N/A N/A Volume (cm) : 0.004 N/A N/A % Reduction in Area: 98.50% N/A N/A % Reduction in Volume: 98.50% N/A N/A Classification: Category/Stage  II N/A N/A Exudate Amount: Small N/A N/A Exudate Type: Serous N/A N/A Exudate Color: amber N/A N/A Wound Margin: Flat and Intact N/A N/A Granulation Amount: Large (67-100%) N/A N/A Necrotic Amount: None Present (0%) N/A N/A Exposed Structures: Fat Layer (Subcutaneous Tissue) N/A N/A Exposed: Yes Fascia: No Tendon: No Muscle: No Joint: No Bone: No Epithelialization: Medium (34-66%) N/A N/A Treatment Notes Wound #5 (Midline Gluteus) Notes Silver cell on open areas Redler, Doni L. ([id number]) Electronic Signature(s) Signed: 09/26/2019 4:28:02 PM By: Linton Ham MD Entered By: Linton Ham on 09/26/2019 09:16:48 Timpone, Francee L. ([id number]) -------------------------------------------------------------------------------- [place] Details Patient Name: Amy [name], PINE. Date of Service: 09/26/2019 8:45 AM Medical Record Number: [id number] Patient Account Number: [id number] Date of Birth/Sex: January 17, 1936 (84 y.o. F) Treating RN: Cornell Barman Primary Care Jeovanny Cuadros: Raelyn Ensign Other Clinician: Referring Irma Roulhac: Raelyn Ensign Treating Belia Febo/Extender: Tito Dine in Treatment: 2 Active Inactive Orientation to the Wound Care Program Nursing Diagnoses: Knowledge deficit related to the wound healing center program Goals: Patient/caregiver will verbalize understanding of the [place] Program Date Initiated: 09/12/2019 Target Resolution Date: 09/12/2019 Goal Status: Active Interventions: Provide education on orientation to the wound center Notes: Pressure Nursing Diagnoses: Knowledge deficit related to causes and risk factors for pressure ulcer development Goals: Patient will remain free of pressure ulcers Date Initiated: 09/12/2019 Target Resolution Date: 08/27/2019 Goal Status: Active Interventions: Assess: immobility, friction, shearing, incontinence upon admission and as needed Assess offloading mechanisms upon admission and as needed Notes: Wound/Skin Impairment Nursing Diagnoses: Impaired tissue integrity Goals: Ulcer/skin breakdown will have a volume reduction of 30% by week 4 Date Initiated: 09/12/2019 Target Resolution Date: 10/12/2019 Goal Status: Active Interventions: Assess ulceration(s) every visit Treatment Activities: Topical wound management initiated : 09/12/2019 Notes: Electronic Signature(s) Signed: 09/26/2019 5:22:43 PM By: Gretta Cool, BSN, RN, CWS, Kim RN, BSN Entered By: Gretta Cool, BSN, RN, CWS, Kim on 09/26/2019 09:11:13 Konecny, Chrysten L. ([id number]) -------------------------------------------------------------------------------- Pain Assessment Details Patient Name: Amy [name], [name] Date of Service: 09/26/2019 8:45 AM Medical Record Number: [id number] Patient Account Number: [id number] Date of Birth/Sex: 1935/12/01 (84 y.o. F) Treating RN: Montey Hora Primary Care Alcario Tinkey: Raelyn Ensign Other Clinician: Referring Maymunah Stegemann: Raelyn Ensign Treating Jamyla Ard/Extender: Tito Dine in Treatment: 2 Active Problems Location of Pain Severity and Description of Pain Patient Has Paino No Site Locations Pain Management and Medication Current Pain Management: Electronic Signature(s) Signed: 09/26/2019 4:27:51 PM By: Montey Hora Entered By: Montey Hora on 09/26/2019 08:57:35 Holtry, Stepheny L.  ([id number]) -------------------------------------------------------------------------------- Patient/Caregiver Education Details Patient Name: Amy [name], [name] Date of Service: 09/26/2019 8:45 AM Medical Record Number: [id number] Patient Account Number: [id number] Date of Birth/Gender: September 03, 1935 (84 y.o. F) Treating RN: Cornell Barman Primary Care Physician: Raelyn Ensign Other Clinician: Referring Physician: Raelyn Ensign Treating Physician/Extender: Tito Dine in Treatment: 2 Education Assessment Education Provided To: Patient Education Topics Provided Wound/Skin Impairment: Handouts: Caring for Your Ulcer, Other: wound care as prescribed Methods: Demonstration, Explain/Verbal Responses: State content correctly Electronic Signature(s) Signed: 09/26/2019 5:22:43 PM By: Gretta Cool, BSN, RN, CWS, Kim RN, BSN Entered By: Gretta Cool, BSN, RN, CWS, Kim on 09/26/2019 09:14:28 Greenblatt, Chakara L. ([id number]) -------------------------------------------------------------------------------- Wound Assessment Details Patient Name: Amy [name], [name] Date of Service: 09/26/2019 8:45 AM Medical Record Number: [id number] Patient Account Number: [id number] Date of Birth/Sex: January 26, 1936 (84 y.o. F) Treating RN: Montey Hora Primary Care Jaree Dwight: Raelyn Ensign Other Clinician: Referring Demarea Lorey: Raelyn Ensign Treating Zadin Lange/Extender: Ricard Dillon Weeks in Treatment: 2 Wound Status Wound Number: 5 Primary Pressure  Ulcer Etiology: Wound Location: Midline Gluteus Secondary MASD Wounding Event: Pressure Injury Etiology: Date Acquired: 08/20/2019 Wound Open Weeks Of Treatment: 2 Status: Clustered Wound: Yes Comorbid Cataracts, Anemia, Hypertension, Peripheral Arterial History: Disease, Type II Diabetes, Osteoarthritis, Dementia, Received Radiation Photos Wound Measurements Length: (cm) 0.5 % Width: (cm) 0.1 % Depth: (cm) 0.1 Ep Clustered Quantity: 1 Tu Area: (cm) 0.039  U Volume: (cm) 0.004 Reduction in Area: 98.5% Reduction in Volume: 98.5% ithelialization: Medium (34-66%) nneling: No ndermining: No Wound Description Classification: Category/Stage II Fo Wound Margin: Flat and Intact Sl Exudate Amount: Small Exudate Type: Serous Exudate Color: amber ul Odor After Cleansing: No ough/Fibrino No Wound Bed Granulation Amount: Large (67-100%) Exposed Structure Necrotic Amount: None Present (0%) Fascia Exposed: No Fat Layer (Subcutaneous Tissue) Exposed: Yes Tendon Exposed: No Muscle Exposed: No Joint Exposed: No Bone Exposed: No Treatment Notes Wound #5 (Midline Gluteus) Notes Amy [name], [name] ([id number]) Silver cell on open areas Electronic Signature(s) Signed: 09/26/2019 4:27:51 PM By: Montey Hora Entered By: Montey Hora on 09/26/2019 09:03:58 Fread, Georgana L. ([id number]) -------------------------------------------------------------------------------- Vitals Details Patient Name: Amy [name], [name] Date of Service: 09/26/2019 8:45 AM Medical Record Number: [id number] Patient Account Number: [id number] Date of Birth/Sex: 1936/02/22 (84 y.o. F) Treating RN: Montey Hora Primary Care Helmi Hechavarria: Raelyn Ensign Other Clinician: Referring Angelynn Lemus: Raelyn Ensign Treating Mareli Antunes/Extender: Tito Dine in Treatment: 2 Vital Signs Time Taken: 08:57 Temperature (F): 98.1 Pulse (bpm): 74 Respiratory Rate (breaths/min): 16 Blood Pressure (mmHg): 114/71 Reference Range: 80 - 120 mg / dl Electronic Signature(s) Signed: 09/26/2019 4:27:51 PM By: Montey Hora Entered By: Montey Hora on 09/26/2019 08:57:28

## 2019-09-27 NOTE — Progress Notes (Signed)
[name], [name] ([id number]) Visit Report for 09/26/2019 HPI Details Patient Name: Amy [name], Amy [name]. Date of Service: 09/26/2019 8:45 AM Medical Record Number: [id number] Patient Account Number: [id number] Date of Birth/Sex: Aug 25, 1935 (84 y.o. F) Treating RN: Cornell Barman Primary Care Provider: Raelyn Ensign Other Clinician: Referring Provider: Raelyn Ensign Treating Provider/Extender: Tito Dine in Treatment: 2 History of Present Illness Associated Signs and Symptoms: The patient has a history of chronic kidney disease, diabetes mellitus type II although her hemoglobin A1c most recent was 5.8 and she is on the medications. 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 She is incontinent. HPI Description: 03/10/18 on evaluation today patient actually presents for evaluation concerning her bilateral gluteal ulcers which have been present for several weeks. She has been treated over time with different creams most recently polysporin. With that being said the patient actually seems to be doing somewhat better at this point in time during evaluation today. Nonetheless she still does have an opening mainly on the left side the right pretty much appears to be closed in my opinion. She does have pain on the left gluteal region. She also tells me about an area under the abdominal fold in the lower abdominal region which appears to be new just yesterday or today. This appears to potentially be a little bit of a fungal rash versus possibly friction from the way her husband has to help her with transfer. No fevers, chills, nausea, or vomiting noted at this time. Patient has dementia and has a very difficult time sometimes following directions. Her husband does the best he can taking care for home but he states is difficult sometimes to get her to do anything but  just sit either in her wheelchair or the recliner which is probably why she's having such issues with her gluteal region. Nonetheless considering how much time she seems to spend sitting I'm pleased that the wounds are not any worse in appearance. 03/17/18 on evaluation today patient actually appears to be completely healed in regard to her ulcers on her gluteal region. She has been tolerating the dressing changes without complication. Her abdominal area also appears to be doing better there is no opening at the site either she continues to use the cream that was prescribed at the last visit. That is the next one to one of zinc oxide and nystatin cream. Overall this seems to be doing very well at this point in time. Readmission: 04/09/2019 on evaluation today patient appears to be doing somewhat more poorly. This is a patient whom I saw almost a year ago. She has had some discoloration in her gluteal region in general since that time. Again I think this is always seemingly due to excessive pressure to the area. She does have her husband with her who is actually her primary caregiver and does a very good job with her in my opinion. With that being said he obviously is somewhat frustrated in general with the fact that this keeps staying open he is also frustrated with the fact that she seems to not really want to stay in the bed to offload and lay on her side she likes to lay on her back he is having a very hard time finding a way to help her keep pressure off of the region. She does have dementia and this is playing a big role as well.  Subsequently he did try to get her a different mattress to go underneath her chair in order to help with offloading but she would not even give it a try. Subsequently he did try donut as well but that just she seems to be moving. He really cannot keep that in the right place. I suggested to him that a piece of memory foam may be better as far as not having to worry  about exact positioning this would help with giving additional pressure offloading support. 04/16/2019 on evaluation today patient actually appears to be completely healed based on what I am seeing. Unfortunately she did not like the [facility] along with a border foam dressing that we ordered at all. She complained that it hurt as soon as she sat on it and she did not want to wear it. Subsequently they ended up discontinuing that and have just been using the Desitin at this point. He did actually have his son help him to put a memory foam mattress on the bed and he also had several pieces he was able to cut to make memory foam cushions for her chair and that has seemed to make a dramatic improvement with regard to the pressure to her gluteal region. With that being said the patient also had been complaining of back pain pretty much every morning and also has not been complaining of this either. Overall he is actually very pleased with how things are doing with the memory foam. READMISSION 09/12/2019 This is a now 84 year old woman we have seen in clinic on 2 separate occasions but I do not believe I have seen her in the past. She was here in 2018 and again in 2020 for gluteal wounds which were felt to be pressure. She has dementia when she sits and lies on her bed on her buttocks and according to her husband there is just no way to get her off this. Complicating the issue is she has urinary incontinence and although her husband attempts to change this frequently he often finds the area wet with urine. The patient is nonambulatory. She is in a wheelchair or lying on her back in bed. She does have memory foam over the mattress in the wheelchair. She is here today as she is felt to have new wounds. Our intake and nurse initially did not notice anything but some excoriation on the skin of her buttocks around the gluteal cleft. Past medical history includes type 2 diabetes, left AKA, urinary  incontinence, prior history of left nephrectomy for malignancy, dementia, hypertension, history of PAD. She has been on oxygen for 2 L for about a year. I am not sure what the primary diagnosis is. 5/5; 2-week follow-up. This is a patient with a very narrow linear wound in the lower sacrum coccyx area. She has been using silver alginate. Her husband is keeping her off this as best he can Engineer, maintenance) Signed: 09/26/2019 4:28:02 PM By: Linton Ham MD Digiulio, Mckaela L. ([id number]) Entered By: Linton Ham on 09/26/2019 09:17:35 Stokes, Kirby L. ([id number]) -------------------------------------------------------------------------------- Physical Exam Details Patient Name: FERN, Montanna L. Date of Service: 09/26/2019 8:45 AM Medical Record Number: [id number] Patient Account Number: [id number] Date of Birth/Sex: January 01, 1936 (84 y.o. F) Treating RN: Cornell Barman Primary Care Provider: Raelyn Ensign Other Clinician: Referring Provider: Raelyn Ensign Treating Provider/Extender: Tito Dine in Treatment: 2 Notes Wound exam; the patient has open wounds in continuation of the gluteal cleft towards the coccyx and sacrum. There were 2 linear areas  last week there is only 1 this week and the vast majority of all of this is closed only a very small open area remains in the lower coccyx area under illumination. There is no evidence of infection Electronic Signature(s) Signed: 09/26/2019 4:28:02 PM By: Linton Ham MD Entered By: Linton Ham on 09/26/2019 09:18:21 [place], Samya L. ([id number]) -------------------------------------------------------------------------------- Physician Orders Details Patient Name: AIDE, WOJNAR L. Date of Service: 09/26/2019 8:45 AM Medical Record Number: [id number] Patient Account Number: [id number] Date of Birth/Sex: 21-Apr-1936 (84 y.o. F) Treating RN: Cornell Barman Primary Care Provider: Raelyn Ensign Other  Clinician: Referring Provider: Raelyn Ensign Treating Provider/Extender: Tito Dine in Treatment: 2 Verbal / Phone Orders: No Diagnosis Coding Skin Barriers/Peri-Wound Care Wound #5 Midline Gluteus o Other: - Keep area clean and dry Primary Wound Dressing Wound #5 Midline Gluteus o Other: - silver cell placed onto wound Dressing Change Frequency Wound #5 Midline Gluteus o Change Dressing Monday, Wednesday, Friday - or more often if soiled Follow-up Appointments Wound #5 Midline Gluteus o Return Appointment in 2 weeks. Off-Loading Wound #5 Midline Gluteus o Turn and reposition every 2 hours - keep pressure off of wounded areas Home Health Wound #5 Midline [place] Visits - [place] Nurse may visit PRN to address patientos wound care needs. o FACE TO FACE ENCOUNTER: MEDICARE and MEDICAID PATIENTS: I certify that this patient is under my care and that I had a face-to-face encounter that meets the physician face-to-face encounter requirements with this patient on this date. The encounter with the patient was in whole or in part for the following MEDICAL CONDITION: (primary reason for [place]) MEDICAL NECESSITY: I certify, that based on my findings, NURSING services are a medically necessary home health service. HOME BOUND STATUS: I certify that my clinical findings support that this patient is homebound (i.e., Due to illness or injury, pt requires aid of supportive devices such as crutches, cane, wheelchairs, walkers, the use of special transportation or the assistance of another person to leave their place of residence. There is a normal inability to leave the home and doing so requires considerable and taxing effort. Other absences are for medical reasons / religious services and are infrequent or of short duration when for other reasons). o If current dressing causes regression in wound condition, may D/C  ordered dressing product/s and apply Normal Saline Moist Dressing daily until next [place] / Other MD appointment. [place] of regression in wound condition at [phone]. o Please direct any NON-WOUND related issues/requests for orders to patient's Primary Care Physician Electronic Signature(s) Signed: 09/26/2019 4:28:02 PM By: Linton Ham MD Signed: 09/26/2019 5:22:43 PM By: Gretta Cool, BSN, RN, CWS, Kim RN, BSN Entered By: Gretta Cool, BSN, RN, CWS, Kim on 09/26/2019 09:13:03 Ketron, Secret L. ([id number]) -------------------------------------------------------------------------------- Problem List Details Patient Name: STEMMER, Paulla L. Date of Service: 09/26/2019 8:45 AM Medical Record Number: [id number] Patient Account Number: [id number] Date of Birth/Sex: 22-Jan-1936 (84 y.o. F) Treating RN: Cornell Barman Primary Care Provider: Raelyn Ensign Other Clinician: Referring Provider: Raelyn Ensign Treating Provider/Extender: Tito Dine in Treatment: 2 Active Problems ICD-10 Encounter Code Description Active Date MDM Diagnosis L89.152 Pressure ulcer of sacral region, stage 2 09/12/2019 No Yes Inactive Problems Resolved Problems Electronic Signature(s) Signed: 09/26/2019 4:28:02 PM By: Linton Ham MD Entered By: Linton Ham on 09/26/2019 09:16:37 Kaner, Emmanuella L. ([id number]) -------------------------------------------------------------------------------- Progress Note Details Patient Name: DEVOS, Takesha L. Date of Service: 09/26/2019 8:45 AM Medical Record  Number: [id number] Patient Account Number: [id number] Date of Birth/Sex: [birth year] (84 y.o. F) Treating RN: Cornell Barman Primary Care Provider: Raelyn Ensign Other Clinician: Referring Provider: Raelyn Ensign Treating Provider/Extender: Tito Dine in Treatment: 2 Subjective History of Present Illness (HPI) The following HPI elements were documented for the patient's  wound: Associated Signs and Symptoms: The patient has a history of chronic kidney disease, diabetes mellitus type II although her hemoglobin A1c most recent was 5.8 and she is on the medications. 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 She is incontinent. 03/10/18 on evaluation today patient actually presents for evaluation concerning her bilateral gluteal ulcers which have been present for several weeks. She has been treated over time with different creams most recently polysporin. With that being said the patient actually seems to be doing somewhat better at this point in time during evaluation today. Nonetheless she still does have an opening mainly on the left side the right pretty much appears to be closed in my opinion. She does have pain on the left gluteal region. She also tells me about an area under the abdominal fold in the lower abdominal region which appears to be new just yesterday or today. This appears to potentially be a little bit of a fungal rash versus possibly friction from the way her husband has to help her with transfer. No fevers, chills, nausea, or vomiting noted at this time. Patient has dementia and has a very difficult time sometimes following directions. Her husband does the best he can taking care for home but he states is difficult sometimes to get her to do anything but just sit either in her wheelchair or the recliner which is probably why she's having such issues with her gluteal region. Nonetheless considering how much time she seems to spend sitting I'm pleased that the wounds are not any worse in appearance. 03/17/18 on evaluation today patient actually appears to be completely healed in regard to her ulcers on her gluteal region. She has been tolerating the dressing changes without complication. Her abdominal area also appears to  be doing better there is no opening at the site either she continues to use the cream that was prescribed at the last visit. That is the next one to one of zinc oxide and nystatin cream. Overall this seems to be doing very well at this point in time. Readmission: 04/09/2019 on evaluation today patient appears to be doing somewhat more poorly. This is a patient whom I saw almost a year ago. She has had some discoloration in her gluteal region in general since that time. Again I think this is always seemingly due to excessive pressure to the area. She does have her husband with her who is actually her primary caregiver and does a very good job with her in my opinion. With that being said he obviously is somewhat frustrated in general with the fact that this keeps staying open he is also frustrated with the fact that she seems to not really want to stay in the bed to offload and lay on her side she likes to lay on her back he is having a very hard time finding a way to help her keep pressure off of the region. She does have dementia and this is playing a big role as well. Subsequently he did try to get her a different mattress to go  underneath her chair in order to help with offloading but she would not even give it a try. Subsequently he did try donut as well but that just she seems to be moving. He really cannot keep that in the right place. I suggested to him that a piece of memory foam may be better as far as not having to worry about exact positioning this would help with giving additional pressure offloading support. 04/16/2019 on evaluation today patient actually appears to be completely healed based on what I am seeing. Unfortunately she did not like the [facility] along with a border foam dressing that we ordered at all. She complained that it hurt as soon as she sat on it and she did not want to wear it. Subsequently they ended up discontinuing that and have just been using the Desitin at  this point. He did actually have his son help him to put a memory foam mattress on the bed and he also had several pieces he was able to cut to make memory foam cushions for her chair and that has seemed to make a dramatic improvement with regard to the pressure to her gluteal region. With that being said the patient also had been complaining of back pain pretty much every morning and also has not been complaining of this either. Overall he is actually very pleased with how things are doing with the memory foam. READMISSION 09/12/2019 This is a now 84 year old woman we have seen in clinic on 2 separate occasions but I do not believe I have seen her in the past. She was here in 2018 and again in 2020 for gluteal wounds which were felt to be pressure. She has dementia when she sits and lies on her bed on her buttocks and according to her husband there is just no way to get her off this. Complicating the issue is she has urinary incontinence and although her husband attempts to change this frequently he often finds the area wet with urine. The patient is nonambulatory. She is in a wheelchair or lying on her back in bed. She does have memory foam over the mattress in the wheelchair. She is here today as she is felt to have new wounds. Our intake and nurse initially did not notice anything but some excoriation on the skin of her buttocks around the gluteal cleft. Past medical history includes type 2 diabetes, left AKA, urinary incontinence, prior history of left nephrectomy for malignancy, dementia, hypertension, history of PAD. She has been on oxygen for 2 L for about a year. I am not sure what the primary diagnosis is. 5/5; 2-week follow-up. This is a patient with a very narrow linear wound in the lower sacrum coccyx area. She has been using silver alginate. Her husband is keeping her off this as best he can Masters, Kalise L. ([id number]) Objective Constitutional Vitals Time Taken: 8:57 AM,  Temperature: 98.1 F, Pulse: 74 bpm, Respiratory Rate: 16 breaths/min, Blood Pressure: 114/71 mmHg. Integumentary (Hair, Skin) Wound #5 status is Open. Original cause of wound was Pressure Injury. The wound is located on the Midline Gluteus. The wound measures 0.5cm length x 0.1cm width x 0.1cm depth; 0.039cm^2 area and 0.004cm^3 volume. There is Fat Layer (Subcutaneous Tissue) Exposed exposed. There is no tunneling or undermining noted. There is a small amount of serous drainage noted. The wound margin is flat and intact. There is large (67-100%) granulation within the wound bed. There is no necrotic tissue within the wound bed. Assessment Active  Problems ICD-10 Pressure ulcer of sacral region, stage 2 Plan Skin Barriers/Peri-Wound Care: Wound #5 Midline Gluteus: Other: - Keep area clean and dry Primary Wound Dressing: Wound #5 Midline Gluteus: Other: - silver cell placed onto wound Dressing Change Frequency: Wound #5 Midline Gluteus: Change Dressing Monday, Wednesday, Friday - or more often if soiled Follow-up Appointments: Wound #5 Midline Gluteus: Return Appointment in 2 weeks. Off-Loading: Wound #5 Midline Gluteus: Turn and reposition every 2 hours - keep pressure off of wounded areas Home Health: Wound #5 Midline Gluteus: Continue Home Health Visits - [place] Nurse may visit PRN to address patient s wound care needs. FACE TO FACE ENCOUNTER: MEDICARE and MEDICAID PATIENTS: I certify that this patient is under my care and that I had a face-to-face encounter that meets the physician face-to-face encounter requirements with this patient on this date. The encounter with the patient was in whole or in part for the following MEDICAL CONDITION: (primary reason for [place]) MEDICAL NECESSITY: I certify, that based on my findings, NURSING services are a medically necessary home health service. HOME BOUND STATUS: I certify that my clinical findings support that this  patient is homebound (i.e., Due to illness or injury, pt requires aid of supportive devices such as crutches, cane, wheelchairs, walkers, the use of special transportation or the assistance of another person to leave their place of residence. There is a normal inability to leave the home and doing so requires considerable and taxing effort. Other absences are for medical reasons / religious services and are infrequent or of short duration when for other reasons). If current dressing causes regression in wound condition, may D/C ordered dressing product/s and apply Normal Saline Moist Dressing daily until next [place] / Other MD appointment. [place] of regression in wound condition at [phone]. Please direct any NON-WOUND related issues/requests for orders to patient's Primary Care Physician 1. We will continue to have silver alginate to the wound area 2. This should be closed by next week Electronic Signature(s) Signed: 09/26/2019 4:28:02 PM By: Linton Ham MD Entered By: Linton Ham on 09/26/2019 09:19:51 Cutright, Yazmyn L. ([id number]) -------------------------------------------------------------------------------- SuperBill Details Patient Name: JANIAYA, RYSER L. Date of Service: 09/26/2019 Medical Record Number: [id number] Patient Account Number: [id number] Date of Birth/Sex: [birth year] (84 y.o. F) Treating RN: Cornell Barman Primary Care Provider: Raelyn Ensign Other Clinician: Referring Provider: Raelyn Ensign Treating Provider/Extender: Tito Dine in Treatment: 2 Diagnosis Coding ICD-10 Codes Code Description L46.503 Pressure ulcer of sacral region, stage 2 Facility Procedures CPT4 Code: [id number] Description: [id number] - WOUND CARE VISIT-LEV 2 EST PT Modifier: Quantity: 1 Physician Procedures CPT4 Code: [id number] Description: [id number] - WC PHYS LEVEL 2 - EST PT Modifier: Quantity: 1 CPT4 Code: Description: ICD-10 Diagnosis  Description L89.152 Pressure ulcer of sacral region, stage 2 Modifier: Quantity: Electronic Signature(s) Signed: 09/26/2019 4:28:02 PM By: Linton Ham MD Entered By: Linton Ham on 09/26/2019 09:20:12

## 2019-09-28 DIAGNOSIS — I129 Hypertensive chronic kidney disease with stage 1 through stage 4 chronic kidney disease, or unspecified chronic kidney disease: Secondary | ICD-10-CM | POA: Diagnosis not present

## 2019-09-28 DIAGNOSIS — D631 Anemia in chronic kidney disease: Secondary | ICD-10-CM | POA: Diagnosis not present

## 2019-09-28 DIAGNOSIS — N189 Chronic kidney disease, unspecified: Secondary | ICD-10-CM | POA: Diagnosis not present

## 2019-09-28 DIAGNOSIS — E1151 Type 2 diabetes mellitus with diabetic peripheral angiopathy without gangrene: Secondary | ICD-10-CM | POA: Diagnosis not present

## 2019-09-28 DIAGNOSIS — F0390 Unspecified dementia without behavioral disturbance: Secondary | ICD-10-CM | POA: Diagnosis not present

## 2019-09-28 DIAGNOSIS — E1122 Type 2 diabetes mellitus with diabetic chronic kidney disease: Secondary | ICD-10-CM | POA: Diagnosis not present

## 2019-09-28 DIAGNOSIS — Z4781 Encounter for orthopedic aftercare following surgical amputation: Secondary | ICD-10-CM | POA: Diagnosis not present

## 2019-09-28 DIAGNOSIS — I70209 Unspecified atherosclerosis of native arteries of extremities, unspecified extremity: Secondary | ICD-10-CM | POA: Diagnosis not present

## 2019-09-28 DIAGNOSIS — E1136 Type 2 diabetes mellitus with diabetic cataract: Secondary | ICD-10-CM | POA: Diagnosis not present

## 2019-09-30 DIAGNOSIS — J449 Chronic obstructive pulmonary disease, unspecified: Secondary | ICD-10-CM | POA: Diagnosis not present

## 2019-10-01 ENCOUNTER — Telehealth: Payer: Self-pay

## 2019-10-01 ENCOUNTER — Ambulatory Visit: Payer: Self-pay | Admitting: *Deleted

## 2019-10-01 NOTE — Chronic Care Management (AMB) (Signed)
   Chronic Care Management   Unsuccessful Call Note 10/01/2019 Name: RUHANI UMLAND MRN: [id number] DOB: 04-25-1936  Patient  is a 84 year old female who sees Thelma Barge, FNP for primary care. Raelyn Ensign, FNP asked the CCM team to consult the patient for level of care concerns.     This social worker was unable to reach patient via telephone today for follow up call . I have left HIPAA compliant voicemail asking patient to return my call. (unsuccessful outreach #1).   Plan: Will follow-up within 7 business days via telephone.     Elliot Gurney, [place] Administrator, [facility] Management [phone]

## 2019-10-02 ENCOUNTER — Telehealth: Payer: Self-pay

## 2019-10-02 ENCOUNTER — Ambulatory Visit: Payer: Self-pay | Admitting: *Deleted

## 2019-10-02 DIAGNOSIS — J449 Chronic obstructive pulmonary disease, unspecified: Secondary | ICD-10-CM

## 2019-10-02 DIAGNOSIS — G301 Alzheimer's disease with late onset: Secondary | ICD-10-CM

## 2019-10-02 NOTE — Chronic Care Management (AMB) (Signed)
Chronic Care Management    Clinical Social Work Follow Up Note  10/02/2019 Name: Amy [name] MRN: [id number] DOB: [birth year]  Amy [name] is a 84 y.o. year old female who is a primary care patient of [name], [place]. The CCM team was consulted for assistance with Level of Care Concerns.   Review of patient status, including review of consultants reports, other relevant assessments, and collaboration with appropriate care team members and the patient's provider was performed as part of comprehensive patient evaluation and provision of chronic care management services.    SDOH (Social Determinants of Health) assessments performed: No    Outpatient Encounter Medications as of 10/02/2019  Medication Sig  . acetaminophen (TYLENOL) 325 MG tablet Take 325 mg by mouth every 6 (six) hours as needed.  [name] albuterol (VENTOLIN HFA) 108 (90 Base) MCG/ACT inhaler Inhale 1-2 puffs into the lungs every 4 (four) hours as needed.   [name] atorvastatin (LIPITOR) 20 MG tablet Take 1 tablet (20 mg total) by mouth daily at 6 PM.  . clopidogrel (PLAVIX) 75 MG tablet TAKE 1 TABLET EVERY DAY  . Cyanocobalamin (VITAMIN B12 PO) Take by mouth.  . donepezil (ARICEPT) 10 MG tablet Take 1 tablet (10 mg total) by mouth at bedtime.  . famotidine (PEPCID) 20 MG tablet Take 1 tablet (20 mg total) by mouth daily. (Patient not taking: Reported on 09/10/2019)  . ferrous sulfate 325 (65 FE) MG tablet Take 325 mg by mouth daily with breakfast. Pt taking every other day  . FLUoxetine (PROZAC) 10 MG capsule Take 1 capsule (10 mg total) by mouth daily.  [name] gabapentin (NEURONTIN) 300 MG capsule Take 1 capsule (300 mg total) by mouth 3 (three) times daily.  [name] HYDROcodone-acetaminophen (NORCO) 7.5-325 MG tablet Take 1 tablet by mouth every 8 (eight) hours as needed for moderate pain.  [name] ipratropium-albuterol (DUONEB) 0.5-2.5 (3) MG/3ML SOLN INHALE THE CONTENTS OF 1 VIAL VIA NEBULIZER THREE TIMES DAILY AS DIRECTED  .  pantoprazole (PROTONIX) 40 MG tablet TAKE 1 TABLET BY MOUTH DAILY  . QUEtiapine (SEROQUEL) 25 MG tablet Take 25 mg by mouth 2 (two) times daily. Takes at 2 pm and Total 75 mg a hs.  . QUEtiapine (SEROQUEL) 50 MG tablet Take 50 mg by mouth at bedtime.  . Thiamine HCl (VITAMIN B-1) 250 MG tablet Take 250 mg by mouth daily.   No facility-administered encounter medications on file as of 10/02/2019.     Goals Addressed            This Visit's Progress   . "I think I need to look in to long term care for my wife" (pt-stated)       [place] (see longitudinal plan of care for additional care plan information)  Current Barriers:  . Level of care concerns  Clinical Social Work Clinical Goal(s):  [name] Over the next 90 days, patient's spouse will work with SW to address concerns related to patient's level of care needs  Interventions: . Patient's spouse confirmed meeting with an [place] Attorney on 54/21 at 97 am . Patient's spouse reported actively working with the attorney to prepare patient for placement . Patient's spouse discussed that patient's condition is progressing and that he is prepared to pay out of pocket until he can apply for medicaid . Explored facility care options and was provided with a list of memory care facilities to follow up on. . This Education officer, museum discussed plan to request Fl2 from patient's doctor  to begin bed search.  Patient Self Care Activities:  . Attends all scheduled provider appointments . Unable to perform ADLs independently . Unable to perform IADLs independently . Knowledge deficit of the out of home placement process  Please see past updates related to this goal by clicking on the "Past Updates" button in the selected goal          Follow Up Plan: SW will follow up with patient by phone over the next 7-10 days   [place], [place] Worker  [facility] Care Management [phone]

## 2019-10-02 NOTE — Patient Instructions (Addendum)
Thank you allowing the Chronic Care Management Team to be a part of your care! It was a pleasure speaking with you today!  1. Please contact patient's primary care doctor for questions regarding power of attorney document for patient.  CCM (Chronic Care Management) Team   Neldon Labella RN, BSN Nurse Care Coordinator  [phone]   [place], LCSW Clinical Social Worker [phone]  Goals Addressed            This Visit's Progress   . "I think I need to look in to long term care for my wife" (pt-stated)       [place] (see longitudinal plan of care for additional care plan information)  Current Barriers:  . Level of care concerns  Clinical Social Work Clinical Goal(s):  [name] Over the next 90 days, patient's spouse will work with SW to address concerns related to patient's level of care needs  Interventions: . Patient's spouse confirmed meeting with an [employer]ish [place] Attorney on 54/21 at 41 am . Patient's spouse reported actively working with the attorney to prepare patient for placement . Patient's spouse discussed that patient's condition is progressing and that he is prepared to pay out of pocket until he can apply for medicaid . Explored facility care options and was provided with a list of memory care facilities to follow up on. . This Education officer, museum discussed plan to request Fl2 from patient's doctor to begin bed search.  Patient Self Care Activities:  . Attends all scheduled provider appointments . Unable to perform ADLs independently . Unable to perform IADLs independently . Knowledge deficit of the out of home placement process  Please see past updates related to this goal by clicking on the "Past Updates" button in the selected goal          The patient verbalized understanding of instructions provided today and declined a print copy of patient instruction materials.   Telephone follow up appointment with care management team member scheduled for:  10/15/19

## 2019-10-03 ENCOUNTER — Encounter: Payer: Medicare HMO | Admitting: Internal Medicine

## 2019-10-03 ENCOUNTER — Other Ambulatory Visit: Payer: Self-pay

## 2019-10-03 DIAGNOSIS — I129 Hypertensive chronic kidney disease with stage 1 through stage 4 chronic kidney disease, or unspecified chronic kidney disease: Secondary | ICD-10-CM | POA: Diagnosis not present

## 2019-10-03 DIAGNOSIS — Z86718 Personal history of other venous thrombosis and embolism: Secondary | ICD-10-CM | POA: Diagnosis not present

## 2019-10-03 DIAGNOSIS — L89152 Pressure ulcer of sacral region, stage 2: Secondary | ICD-10-CM | POA: Diagnosis not present

## 2019-10-03 DIAGNOSIS — N189 Chronic kidney disease, unspecified: Secondary | ICD-10-CM | POA: Diagnosis not present

## 2019-10-03 DIAGNOSIS — Z89612 Acquired absence of left leg above knee: Secondary | ICD-10-CM | POA: Diagnosis not present

## 2019-10-03 DIAGNOSIS — Z905 Acquired absence of kidney: Secondary | ICD-10-CM | POA: Diagnosis not present

## 2019-10-03 DIAGNOSIS — E1122 Type 2 diabetes mellitus with diabetic chronic kidney disease: Secondary | ICD-10-CM | POA: Diagnosis not present

## 2019-10-03 DIAGNOSIS — I739 Peripheral vascular disease, unspecified: Secondary | ICD-10-CM | POA: Diagnosis not present

## 2019-10-03 DIAGNOSIS — Z85528 Personal history of other malignant neoplasm of kidney: Secondary | ICD-10-CM | POA: Diagnosis not present

## 2019-10-05 DIAGNOSIS — E1136 Type 2 diabetes mellitus with diabetic cataract: Secondary | ICD-10-CM | POA: Diagnosis not present

## 2019-10-05 DIAGNOSIS — I129 Hypertensive chronic kidney disease with stage 1 through stage 4 chronic kidney disease, or unspecified chronic kidney disease: Secondary | ICD-10-CM | POA: Diagnosis not present

## 2019-10-05 DIAGNOSIS — N189 Chronic kidney disease, unspecified: Secondary | ICD-10-CM | POA: Diagnosis not present

## 2019-10-05 DIAGNOSIS — E1151 Type 2 diabetes mellitus with diabetic peripheral angiopathy without gangrene: Secondary | ICD-10-CM | POA: Diagnosis not present

## 2019-10-05 DIAGNOSIS — F0390 Unspecified dementia without behavioral disturbance: Secondary | ICD-10-CM | POA: Diagnosis not present

## 2019-10-05 DIAGNOSIS — D631 Anemia in chronic kidney disease: Secondary | ICD-10-CM | POA: Diagnosis not present

## 2019-10-05 DIAGNOSIS — Z4781 Encounter for orthopedic aftercare following surgical amputation: Secondary | ICD-10-CM | POA: Diagnosis not present

## 2019-10-05 DIAGNOSIS — I70209 Unspecified atherosclerosis of native arteries of extremities, unspecified extremity: Secondary | ICD-10-CM | POA: Diagnosis not present

## 2019-10-05 DIAGNOSIS — E1122 Type 2 diabetes mellitus with diabetic chronic kidney disease: Secondary | ICD-10-CM | POA: Diagnosis not present

## 2019-10-09 ENCOUNTER — Telehealth: Payer: Self-pay | Admitting: Emergency Medicine

## 2019-10-09 NOTE — Telephone Encounter (Signed)
Copied from [place] [phone]. Topic: General - Other >> Oct 08, 2019 10:05 AM Alanda Slim E wrote: Reason for CRM: Amy [name] would like a call from nurse or new PCP today/Pts husband called and needs a letter to have him as power of attorney/ They are in the process of trying to get her in a nursing home/ please call Pt for more information/ letter is needed before June / Pts husband states it is harder for him to care for her with his health/ please advise

## 2019-10-09 NOTE — Telephone Encounter (Signed)
Spoke to [place] he stated he need a note stating [facility] medical condition and that she is unable to take care of herself. He is trying to hire sitters to help him care for her until he can get her placed ina home. Court hearing in Jun 8. Cassandra to schedule appointment

## 2019-10-10 NOTE — Progress Notes (Signed)
Patient ID: Amy [name], female    DOB: 1936/05/03, 84 y.o.   MRN: [id number]  PCP: Towanda Malkin, MD  Chief Complaint  Patient presents with  . Follow-up  . Dementia    Subjective:   Amy [name] is a 84 y.o. female, presents to clinic with CC of the following:  Chief Complaint  Patient presents with  . Follow-up  . Dementia    HPI:  Patient is an 84 year old female patient of Raelyn Ensign Her last visit at [facility] was with Delsa Grana for a suspected UTI, with concerns noted at that visit for her chronic care management, and that note was reviewed Follows up today with her husband who has been her primary caregiver at home recently, and the husband recently messaged requesting a note to help him in his efforts to try and get her placed in a nursing home environment, with his health limiting his ability to provide care for her presently. A court hearing in Jun 8. 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  She continues to be followed by wound care for pressure ulcers in the sacral region, with the last visit 09/26/2019.  He notes that wound care arranged for nurses to come to the house to check periodically, and that they have a follow-up with wound care again next week.  She had a palliative care f/u visit on 09/10/2019, and has been followed by palliative care.  Management recommendations from their last visit included: Skin integrity: Wound care is following to help DOE:  Using oxygen per [place]. Refuses nebulizers most times Pain: Has pain in sacrum from breakdown. Is on sacrum much of the time as she cannot ambulate. Changes between chair and bed. Seen by pain management. Dementia: Having some hallucinations, sundowning and  Insomnia. Has seroquel 25 in am and  75 mg in pm. Recommended 600 mg gabapentin at hs for insomnia and pain control, and 5-10 mg melatonin.   Family /Caregiver/Community Supports: Increased caregiver strain from pt not wanting to be alone. Calls out day and night for husband. Husband feels she should understand not to do this. We discussed insomnia. Many attempts to teach about limitations of dementia patient perception/understanding. However, he is main caregiver and very overwhelmed.  He is seeking placement.  Cognitive / Functional decline: Alert, oriented x 1-2. Hallucinations reported. Intake is diminishing.  Palliative care plans to follow-up again in 4 to 6 weeks.  Patient was seen in the emergency department 08/08/2019 with complaints of phantom pain in the left leg (status post BKA in 2007).  X-rays and lab work were unremarkable.  Her last visit with nephrology was 07/27/2019 with their assessment/plan as follows: 1. Chronic Kidney Disease stage IV with proteinuria status post left nephrectomy: Chronic kidney disease secondary to solitary kidney, vascular disease, hypertension and diabetes. Proteinuria from hyperfiltration of solitary kidney. - Currently off lisinopril due to pervious hypotension   Lab Results  Component Value Date   CREATININE 1.67 (H) 08/08/2019   BUN 22 08/08/2019   NA 140 08/08/2019   K 5.1 08/08/2019   CL 106 08/08/2019   CO2 26 08/08/2019   2. Hypertension with edema: blood pressure at goal. No edema on examination. Patient may change furosemide to as needed for edema.   BP Readings from Last 3  Encounters:  10/11/19 136/86  08/08/19 (!) 145/85  07/13/19 (!) 147/86   3. Secondary Hyperparathyroidism: - Continue ergocalciferol  4. Anemia of chronic kidney disease: stable. Lab Results  Component Value Date   WBC 8.4 08/08/2019   HGB 10.9 (L) 08/08/2019   HCT 34.3 (L) 08/08/2019   MCV 87.3 08/08/2019   PLT 236 08/08/2019    She was last seen by chronic pain 07/24/2019, who manages her  pain management and includes prescriptions for Norco and gabapentin.  She last saw vascular in February 2021, and noted she no longer can use her prosthesis due to cognitive decline, and they continue to follow her peripheral vascular disease in her right lower extremity.  She last saw oncology 07/04/2019 with her issues addressed as follows: 2.   Clinicalstage Igastric carcinoma She is s/ppalliative radiation completed on08/10/2018. PET scanon 02/28/2019 revealed focal metabolism along the posterior gastric cardia, improved. Continue to monitor. 3. Stage III renal cell carcinoma  Sheis s/pleft radical nephrectomyon 01/09/2015. Patient had high risk disease. PET scan on 02/28/2019 revealed small pulmonary nodules, small mediastinal nodules, and lytic lesion of 8th rib suspicious for malignancy. Lesions may represent renal cell carcinoma. Unable to safely biopsy rib lesion.             She received palliatve radiation (completed 04/06/2019).             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 There was slight growth of bilateral pulmonary nodules suspicious for pulmonary metastases.             Discuss plans for repeat chest CT. 4. Lytic lesion of left 8th rib She received palliative radiation to the 8th rib with 2000 cGy in 5 fractions (04/02/2019 - 04/06/2019).             She denies any pain.             Continue to monitor. 5.Pulmonary nodules PET scanon 02/28/2019 revealed small pulmonary nodules. Dr Patsey Berthold notes bronchoscopy high risk. Rib biopsy was also high risk to biopsy. Chest CT in 02/2019 noted growth of nodules.             Schedule chest CT on 07/10/2019.  Also with history of diabetes Lab Results    Component Value Date   HGBA1C 5.8 01/19/2019    Controlled DM/Obesity: She is eating whatever she wants right now; A1C has been well controlled for over a year now.  Lab Results  Component Value Date   HGBA1C 5.8 01/19/2019   BS this am was 116, checks once a week.  She does not like to get her finger stuck per her husband, and I did note that the checks can be lessened to once a month and as needed if any symptoms of concern are arising.   Also has emphysema, on chronic O2 presently, with her last visit with pulmonary in September 2020. Her last visit with cardiology was in October 2020.  Chronic rhinitis -notes has a lot of postnasal drip on a fairly regular basis, and does have a saline mist type product that she uses to try to help.  Is clear by description.  Did note the chronic O2 via nasal cannula can sometimes exacerbate the dryness concerns.   Patient Active Problem List   Diagnosis Date Noted  . Bone metastasis ([place]) 03/13/2019  . Rib lesion 03/08/2019  . Right lower lobe pulmonary nodule 02/08/2019  . Cancer  of intra-abdominal lymph nodes ([place]) 01/19/2019  . Genital labial ulcer 12/06/2018  . B12 deficiency 11/05/2018  . Folate deficiency 11/05/2018  . Gastric cancer ([place])   . Stomach irritation   . Obesity (BMI 30.0-34.9) 09/11/2018  . Pulmonary nodules 08/27/2018  . Goals of care, counseling/discussion   . Palliative care by specialist   . Hypoxia 07/26/2018  . HCAP (healthcare-associated pneumonia) 07/02/2018  . Hypoxemia   . Renal cell carcinoma ([place])   . Lung nodule 04/23/2018  . Hypertensive renal disease 11/08/2017  . Secondary hyperparathyroidism of renal origin ([place]) 11/08/2017  . QT prolongation 10/27/2017  . Osteopenia 10/05/2017  . Choledocholithiasis 08/13/2017  . Chronic pain syndrome 04/05/2017  . Depression 11/15/2016  . Iron deficiency anemia 09/08/2016  . Anemia 06/29/2016  . Late onset Alzheimer's disease with behavioral disturbance ([place])  06/14/2016  . S/P AKA (above knee amputation) unilateral, left ([place]) 06/08/2016  . Wheelchair dependent 03/11/2016  . 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 S/p nephrectomy 01/29/2015  . Renal neoplasm 01/15/2015  . Abnormal EKG 01/02/2015  . Renal mass 12/05/2014  . GERD (gastroesophageal reflux disease) 11/20/2014  . GAD (generalized anxiety disorder) 11/20/2014  . AB (asthmatic bronchitis) 11/20/2014  . Aortic atherosclerosis ([place]) 11/20/2014  . Cataract 11/20/2014  . Leg pain 11/20/2014  . Continuous opioid dependence ([place]) 11/20/2014  . CKD (chronic kidney disease) stage 4, GFR 15-29 ml/min (HCC) 11/20/2014  . COPD (chronic obstructive pulmonary disease) ([place]) 11/20/2014  . Excessive falling 11/20/2014  . Fatty infiltration of liver 11/20/2014  . Hyperlipidemia 11/20/2014  . Anterior knee pain 11/20/2014  . LBP (low back pain) 11/20/2014  . Malaise and fatigue 11/20/2014  . Abnormal presence of protein in urine 11/20/2014  . PVD (peripheral vascular disease) ([place]) 11/20/2014  . Allergic rhinitis 11/20/2014  . At risk for falling 11/20/2014  . Anxiety 11/05/2014  . Elevated serum creatinine 11/05/2014  . Diabetes mellitus type 2, controlled ([place]) 11/05/2014  . H/O adenomatous polyp of colon 07/27/2010      Current Outpatient Medications:  .  acetaminophen (TYLENOL) 325 MG tablet, Take 325 mg by mouth every 6 (six) hours as needed., Disp: , Rfl:  .  albuterol (VENTOLIN HFA) 108 (90 Base) MCG/ACT inhaler, Inhale 1-2 puffs into the lungs every 4 (four) hours as needed. , Disp: , Rfl:  .  atorvastatin (LIPITOR) 20 MG tablet, Take 1 tablet (20 mg total) by mouth daily at 6 PM., Disp: 90 tablet, Rfl: 3 .  clopidogrel (PLAVIX) 75 MG tablet, TAKE 1 TABLET EVERY DAY, Disp: 90 tablet, Rfl: 3 .  Cyanocobalamin (VITAMIN B12 PO), Take by mouth., Disp: , Rfl:  .  donepezil (ARICEPT) 10 MG tablet, Take 1 tablet (10 mg total) by mouth at bedtime., Disp: 90  tablet, Rfl: 3 .  famotidine (PEPCID) 20 MG tablet, Take 1 tablet (20 mg total) by mouth daily., Disp: 90 tablet, Rfl: 3 .  ferrous sulfate 325 (65 FE) MG tablet, Take 325 mg by mouth daily with breakfast. Pt taking every other day, Disp: , Rfl:  .  FLUoxetine (PROZAC) 10 MG capsule, Take 1 capsule (10 mg total) by mouth daily., Disp: 30 capsule, Rfl: 0 .  gabapentin (NEURONTIN) 300 MG capsule, Take 1 capsule (300 mg total) by mouth 3 (three) times daily., Disp: 270 capsule, Rfl: 3 .  HYDROcodone-acetaminophen (NORCO) 7.5-325 MG tablet, Take 1 tablet by mouth every 8 (eight) hours as needed for moderate pain., Disp: 90 tablet, Rfl: 0 .  ipratropium-albuterol (DUONEB) 0.5-2.5 (3) MG/3ML SOLN, INHALE THE CONTENTS OF 1 VIAL VIA NEBULIZER THREE TIMES DAILY AS DIRECTED, Disp: 810 mL, Rfl: 2 .  pantoprazole (PROTONIX) 40 MG tablet, TAKE 1 TABLET BY MOUTH DAILY, Disp: 90 tablet, Rfl: 1 .  QUEtiapine (SEROQUEL) 25 MG tablet, Take 25 mg by mouth 2 (two) times daily. Takes at 2 pm and Total 75 mg a hs., Disp: , Rfl:  .  QUEtiapine (SEROQUEL) 50 MG tablet, Take 50 mg by mouth at bedtime., Disp: , Rfl:  .  Thiamine HCl (VITAMIN B-1) 250 MG tablet, Take 250 mg by mouth daily., Disp: , Rfl:    Allergies  Allergen Reactions  . Fentanyl Other (See Comments)    urine retention  . Penicillins Itching, Rash and Other (See Comments)    Has patient had a PCN reaction causing immediate rash, facial/tongue/throat swelling, SOB or lightheadedness with hypotension: Yes Has patient had a PCN reaction causing severe rash involving mucus membranes or skin necrosis: No Has patient had a PCN reaction that required hospitalization: No Has patient had a PCN reaction occurring within the last 10 years: Yes If all of the above answers are "NO", then may proceed with Cephalosporin use.     Past Surgical History:  Procedure Laterality Date  . ABDOMINAL HYSTERECTOMY  1978  . CARPAL TUNNEL RELEASE Bilateral   .  CHOLECYSTECTOMY    . COLONOSCOPY    . COLONOSCOPY WITH ESOPHAGOGASTRODUODENOSCOPY (EGD)    . ERCP N/A 08/19/2017   Procedure: ENDOSCOPIC RETROGRADE CHOLANGIOPANCREATOGRAPHY (ERCP);  Surgeon: Arta Silence, MD;  Location: [facility] ENDOSCOPY;  Service: Endoscopy;  Laterality: N/A;  . ESOPHAGOGASTRODUODENOSCOPY (EGD) WITH PROPOFOL N/A 10/12/2018   Procedure: ESOPHAGOGASTRODUODENOSCOPY (EGD) WITH PROPOFOL;  Surgeon: Virgel Manifold, MD;  Location: [facility] ENDOSCOPY;  Service: Endoscopy;  Laterality: N/A;  . LEG AMPUTATION     above the knee / left   . ROBOT ASSISTED LAPAROSCOPIC NEPHRECTOMY Left 01/15/2015   Procedure: ROBOTIC ASSISTED LAPAROSCOPIC RADICAL NEPHRECTOMY;  Surgeon: Alexis Frock, MD;  Location: [facility] ORS;  Service: Urology;  Laterality: Left;  . stent placement right leg        Family History  Problem Relation Age of Onset  . Cancer Mother        Stomach  . Cancer Father   . Diabetes Brother   . Cancer Brother        oral  . Healthy Son   . Healthy Sister      Social History   Tobacco Use  . Smoking status: Former Smoker    Packs/day: 1.50    Years: 51.00    Pack years: 76.50    Types: Cigarettes    Quit date: 05/24/2004    Years since quitting: 15.3  . Smokeless tobacco: Never Used  . Tobacco comment: smoking cessation materials not required  Substance Use Topics  . Alcohol use: Not Currently    With staff assistance, above reviewed with the patient/caregiver today.  ROS: As per HPI, otherwise no specific complaints on a limited and focused system review   No results found for this or any previous visit (from the past 72 hour(s)).   PHQ2/9: Depression screen [facility] 2/9 03/26/2019 01/19/2019 01/05/2019 12/22/2018 11/28/2018  Decreased Interest 1 0 1 0 0  Down, Depressed, Hopeless 1 0 1 1 0  PHQ - 2 Score 2 0 2 1 0  Altered sleeping 1 0 1 0 0  Tired, decreased energy 1 0 0 0 0  Change in appetite 1 0  1 0 0  Feeling bad or failure about yourself  1 0 0 0 0  Trouble  concentrating 1 0 1 0 0  Moving slowly or fidgety/restless 1 0 1 0 0  Suicidal thoughts 0 0 0 0 0  PHQ-9 Score 8 0 6 1 0  Difficult doing work/chores Somewhat difficult Not difficult at all Not difficult at all Not difficult at all Not difficult at all  Some recent data might be hidden   PHQ-2/9 Result reviewed  Fall Risk: Fall Risk  10/11/2019 06/25/2019 05/22/2019 03/26/2019 02/20/2019  Falls in the past year? 1 1 0 0 1  Comment - - - - -  Number falls in past yr: 1 1 0 0 1  Injury with Fall? 0 1 0 0 1  Comment - - - - feel out of wc and hit head, stiches  Risk Factor Category  - - - - -  Risk for fall due to : - - - - History of fall(s)  Follow up - - - Falls evaluation completed Falls evaluation completed      Objective:   Vitals:   10/11/19 1326  BP: 136/86  Pulse: 93  Resp: 20  Temp: 98.1 F (36.7 C)  TempSrc: Temporal  SpO2: 98%  Height: 5\' 2"  (1.575 m)    Body mass index is 28.17 kg/m.  Physical Exam   NAD, masked, sitting in a wheelchair with oxygen delivered via nasal cannula HEENT - [place]/AT, sclera anicteric, positive glasses,  conj - non-inj'ed, pharynx clear Neck - supple, no adenopathy, carotids 2+ and = without bruits bilat Car - RRR without m/g/r Pulm- RR and effort normal at rest, CTA without wheeze or rales Abd - soft, obese, diffusely NT, ND,  Back - no CVA tenderness Ext - no LE edema on the right lower extremity, status post amputation on the left lower extremity,  Neuro/psychiatric - affect was not flat, was able to respond with brief 1 to a few word answers to questions asked, seemingly appropriate at times, although the husband was able to clarify in some cases, as when I asked if she can bathe herself and she stated yes, husband noted no, and the only thing she can do for herself is feed herself presently.  Alert     Results for orders placed or performed during the hospital encounter of 09/62/83  Basic metabolic panel  Result Value Ref Range    Sodium 140 135 - 145 mmol/L   Potassium 5.1 3.5 - 5.1 mmol/L   Chloride 106 98 - 111 mmol/L   CO2 26 22 - 32 mmol/L   Glucose, Bld 103 (H) 70 - 99 mg/dL   BUN 22 8 - 23 mg/dL   Creatinine, Ser 1.67 (H) 0.44 - 1.00 mg/dL   Calcium 8.4 (L) 8.9 - 10.3 mg/dL   GFR calc non Af Amer 28 (L) >60 mL/min   GFR calc Af Amer 33 (L) >60 mL/min   Anion gap 8 5 - 15  CBC with Differential  Result Value Ref Range   WBC 8.4 4.0 - 10.5 K/uL   RBC 3.93 3.87 - 5.11 MIL/uL   Hemoglobin 10.9 (L) 12.0 - 15.0 g/dL   HCT 34.3 (L) 36.0 - 46.0 %   MCV 87.3 80.0 - 100.0 fL   MCH 27.7 26.0 - 34.0 pg   MCHC 31.8 30.0 - 36.0 g/dL   RDW 15.8 (H) 11.5 - 15.5 %   Platelets 236 150 - 400 K/uL  nRBC 0.0 0.0 - 0.2 %   Neutrophils Relative % 67 %   Neutro Abs 5.7 1.7 - 7.7 K/uL   Lymphocytes Relative 17 %   Lymphs Abs 1.4 0.7 - 4.0 K/uL   Monocytes Relative 8 %   Monocytes Absolute 0.7 0.1 - 1.0 K/uL   Eosinophils Relative 6 %   Eosinophils Absolute 0.5 0.0 - 0.5 K/uL   Basophils Relative 1 %   Basophils Absolute 0.1 0.0 - 0.1 K/uL   Immature Granulocytes 1 %   Abs Immature Granulocytes 0.04 0.00 - 0.07 K/uL       Assessment & Plan:   1. Late onset Alzheimer's disease with behavioral disturbance ([place]) Concern noted with some progression here in the past, both with the patient's husband who is the caregiver history as well as input from palliative care visit recently.  Do feel more support is needed for patient's care presently, and chronic care management is involved as well to help with placement that is being pursued by the husband presently. Did complete a note for the patient's husband today as well to help with him gaining decision-making capabilities that are needed for the patient.  2. Chronic obstructive pulmonary disease, unspecified COPD type ([place]) On chronic O2 presently, with the patient not utilizing the albuterol nebulizer to any great extent, and seems to be doing okay with the chronic  oxygen. Do feel  delivery via nasal cannula may be exacerbating some rhinitis symptoms noted. Discussed any further follow-up with pulmonary, and the patient's husband noted that they left follow-up on an as-needed basis.  3. CKD (chronic kidney disease) stage 4, GFR 15-29 ml/min (HCC) Still follows with nephrology and monitoring  4. Essential hypertension Blood pressures have remained fairly well controlled, and nephrology has been following as well.  5. Anemia due to chronic kidney disease, unspecified CKD stage Has been stable recent past.  6. Gastroesophageal reflux disease A refill of the Protonix was asked for, and sent to [facility] for cost purposes.  This was done today. - pantoprazole (PROTONIX) 40 MG tablet; Take 1 tablet (40 mg total) by mouth daily.  Dispense: 90 tablet; Refill: 1  7. Controlled type 2 diabetes mellitus with stage 4 chronic kidney disease, without long-term current use of insulin (HCC) Blood sugars have been well controlled on checks at home, done about weekly.  Last A1c was good as well. Discussed with the husband that trying to get strict adherence to blood sugar levels is less critical presently at, and checking sugar once a month or as needed if symptomatic is very reasonable. Patient not like getting blood drawn or getting fingersticks, and not feel critical to recheck her sugar status today. - Accu-Chek Softclix Lancets lancets; Use as instructed  Dispense: 100 each; Refill: 12  8. Pressure injury of skin of buttock, unspecified injury stage, unspecified laterality Keep follow-up with wound care who has been involved following, with help from nurses that see her at home recently  62. Other chronic pain Continue with chronic pain management.  10. 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  11. PVD (peripheral vascular disease) ([place]) Still sees vascular.  Continue  follow-up with them  12. Malignant neoplasm of stomach, unspecified location [facility]) Sees oncology for follow-up.  13. Renal cell carcinoma, unspecified laterality ([place]) Sees oncology for follow-up.  14. Chronic rhinitis Discussed the potential source, and do feel a saline nasal spray product which  they have been utilizing is good to continue. Did note a Flonase type product which is a nasal steroid, can often make things more dry and would not rush to adding that.   The husband is helpful in getting the patient placed to an extended care facility in the very near future, and I do think that is the right decision. If there is anything needed from me to help with that he should let me know. Follow-up can continue on a 3 to 2-month timeframe until placement occurs.   Towanda Malkin, MD 10/11/19 1:33 PM

## 2019-10-11 ENCOUNTER — Other Ambulatory Visit: Payer: Self-pay

## 2019-10-11 ENCOUNTER — Ambulatory Visit (INDEPENDENT_AMBULATORY_CARE_PROVIDER_SITE_OTHER): Payer: Medicare HMO | Admitting: Internal Medicine

## 2019-10-11 ENCOUNTER — Encounter: Payer: Self-pay | Admitting: Internal Medicine

## 2019-10-11 VITALS — BP 136/86 | HR 93 | Temp 98.1°F | Resp 20 | Ht 62.0 in

## 2019-10-11 DIAGNOSIS — G301 Alzheimer's disease with late onset: Secondary | ICD-10-CM

## 2019-10-11 DIAGNOSIS — G8929 Other chronic pain: Secondary | ICD-10-CM | POA: Diagnosis not present

## 2019-10-11 DIAGNOSIS — N189 Chronic kidney disease, unspecified: Secondary | ICD-10-CM

## 2019-10-11 DIAGNOSIS — C649 Malignant neoplasm of unspecified kidney, except renal pelvis: Secondary | ICD-10-CM

## 2019-10-11 DIAGNOSIS — Z89612 Acquired absence of left leg above knee: Secondary | ICD-10-CM | POA: Diagnosis not present

## 2019-10-11 DIAGNOSIS — K219 Gastro-esophageal reflux disease without esophagitis: Secondary | ICD-10-CM

## 2019-10-11 DIAGNOSIS — L89309 Pressure ulcer of unspecified buttock, unspecified stage: Secondary | ICD-10-CM

## 2019-10-11 DIAGNOSIS — E1122 Type 2 diabetes mellitus with diabetic chronic kidney disease: Secondary | ICD-10-CM

## 2019-10-11 DIAGNOSIS — J449 Chronic obstructive pulmonary disease, unspecified: Secondary | ICD-10-CM | POA: Diagnosis not present

## 2019-10-11 DIAGNOSIS — G546 Phantom limb syndrome with pain: Secondary | ICD-10-CM

## 2019-10-11 DIAGNOSIS — F0281 Dementia in other diseases classified elsewhere with behavioral disturbance: Secondary | ICD-10-CM

## 2019-10-11 DIAGNOSIS — I10 Essential (primary) hypertension: Secondary | ICD-10-CM

## 2019-10-11 DIAGNOSIS — N184 Chronic kidney disease, stage 4 (severe): Secondary | ICD-10-CM

## 2019-10-11 DIAGNOSIS — I739 Peripheral vascular disease, unspecified: Secondary | ICD-10-CM

## 2019-10-11 DIAGNOSIS — D631 Anemia in chronic kidney disease: Secondary | ICD-10-CM

## 2019-10-11 DIAGNOSIS — F02818 Dementia in other diseases classified elsewhere, unspecified severity, with other behavioral disturbance: Secondary | ICD-10-CM

## 2019-10-11 DIAGNOSIS — C169 Malignant neoplasm of stomach, unspecified: Secondary | ICD-10-CM

## 2019-10-11 DIAGNOSIS — J310 Chronic rhinitis: Secondary | ICD-10-CM

## 2019-10-11 MED ORDER — PANTOPRAZOLE SODIUM 40 MG PO TBEC: 40.0000 mg | DELAYED_RELEASE_TABLET | Freq: Every day | ORAL | 1 refills | Status: AC

## 2019-10-11 MED ORDER — ACCU-CHEK SOFTCLIX LANCETS MISC: 12 refills | Status: AC

## 2019-10-12 DIAGNOSIS — E1151 Type 2 diabetes mellitus with diabetic peripheral angiopathy without gangrene: Secondary | ICD-10-CM | POA: Diagnosis not present

## 2019-10-12 DIAGNOSIS — Z4781 Encounter for orthopedic aftercare following surgical amputation: Secondary | ICD-10-CM | POA: Diagnosis not present

## 2019-10-12 DIAGNOSIS — E1136 Type 2 diabetes mellitus with diabetic cataract: Secondary | ICD-10-CM | POA: Diagnosis not present

## 2019-10-12 DIAGNOSIS — D631 Anemia in chronic kidney disease: Secondary | ICD-10-CM | POA: Diagnosis not present

## 2019-10-12 DIAGNOSIS — N189 Chronic kidney disease, unspecified: Secondary | ICD-10-CM | POA: Diagnosis not present

## 2019-10-12 DIAGNOSIS — E1122 Type 2 diabetes mellitus with diabetic chronic kidney disease: Secondary | ICD-10-CM | POA: Diagnosis not present

## 2019-10-12 DIAGNOSIS — F0390 Unspecified dementia without behavioral disturbance: Secondary | ICD-10-CM | POA: Diagnosis not present

## 2019-10-12 DIAGNOSIS — I129 Hypertensive chronic kidney disease with stage 1 through stage 4 chronic kidney disease, or unspecified chronic kidney disease: Secondary | ICD-10-CM | POA: Diagnosis not present

## 2019-10-12 DIAGNOSIS — I70209 Unspecified atherosclerosis of native arteries of extremities, unspecified extremity: Secondary | ICD-10-CM | POA: Diagnosis not present

## 2019-10-15 ENCOUNTER — Ambulatory Visit (INDEPENDENT_AMBULATORY_CARE_PROVIDER_SITE_OTHER): Payer: Medicare HMO | Admitting: *Deleted

## 2019-10-15 DIAGNOSIS — G301 Alzheimer's disease with late onset: Secondary | ICD-10-CM

## 2019-10-15 DIAGNOSIS — D631 Anemia in chronic kidney disease: Secondary | ICD-10-CM | POA: Diagnosis not present

## 2019-10-15 DIAGNOSIS — E1151 Type 2 diabetes mellitus with diabetic peripheral angiopathy without gangrene: Secondary | ICD-10-CM | POA: Diagnosis not present

## 2019-10-15 DIAGNOSIS — E1122 Type 2 diabetes mellitus with diabetic chronic kidney disease: Secondary | ICD-10-CM | POA: Diagnosis not present

## 2019-10-15 DIAGNOSIS — E1136 Type 2 diabetes mellitus with diabetic cataract: Secondary | ICD-10-CM | POA: Diagnosis not present

## 2019-10-15 DIAGNOSIS — Z4781 Encounter for orthopedic aftercare following surgical amputation: Secondary | ICD-10-CM | POA: Diagnosis not present

## 2019-10-15 DIAGNOSIS — J449 Chronic obstructive pulmonary disease, unspecified: Secondary | ICD-10-CM | POA: Diagnosis not present

## 2019-10-15 DIAGNOSIS — I129 Hypertensive chronic kidney disease with stage 1 through stage 4 chronic kidney disease, or unspecified chronic kidney disease: Secondary | ICD-10-CM | POA: Diagnosis not present

## 2019-10-15 DIAGNOSIS — N189 Chronic kidney disease, unspecified: Secondary | ICD-10-CM | POA: Diagnosis not present

## 2019-10-15 DIAGNOSIS — I70209 Unspecified atherosclerosis of native arteries of extremities, unspecified extremity: Secondary | ICD-10-CM | POA: Diagnosis not present

## 2019-10-15 DIAGNOSIS — I10 Essential (primary) hypertension: Secondary | ICD-10-CM | POA: Diagnosis not present

## 2019-10-15 DIAGNOSIS — F0281 Dementia in other diseases classified elsewhere with behavioral disturbance: Secondary | ICD-10-CM | POA: Diagnosis not present

## 2019-10-15 DIAGNOSIS — F0390 Unspecified dementia without behavioral disturbance: Secondary | ICD-10-CM | POA: Diagnosis not present

## 2019-10-15 NOTE — Patient Instructions (Addendum)
Thank you allowing the Chronic Care Management Team to be a part of your care! It was a pleasure speaking with you today!  1. Please continue to work with the attorney to assist with placement process.  CCM (Chronic Care Management) Team   Neldon Labella RN, BSN Nurse Care Coordinator  [phone]  [place], LCSW Clinical Social Worker [phone]  Goals Addressed            This Visit's Progress   . Per patient's spouse "I think I need to look in to long term care for my wife" (pt-stated)       CARE PLAN ENTRY (see longitudinal plan of care for additional care plan information)  Current Barriers:  . Level of care concerns  Clinical Social Work Clinical Goal(s):  [name] Over the next 90 days, patient's spouse will work with SW to address concerns related to patient's level of care needs  Interventions: . Patient's spouse confirmed court hearing to determine financial control  is scheduled for 10/30/19 . Patient's spouse continues to report actively working with the attorney to prepare patient for placement . Patient's spouse discussed that patient's condition continues to progress and that he is prepared to pay out of pocket until he can apply for medicaid . Explored facility care options and was provided with a list of memory care facilities to follow up on. . This Education officer, museum discussed plan to request Fl2 from patient's doctor to begin bed search.  Patient Self Care Activities:  . Attends all scheduled provider appointments . Unable to perform ADLs independently . Unable to perform IADLs independently . Knowledge deficit of the out of home placement process  Please see past updates related to this goal by clicking on the "Past Updates" button in the selected goal          The patient verbalized understanding of instructions provided today and declined a print copy of patient instruction materials.   Telephone follow up appointment with care management team  member scheduled for: 10/18/19

## 2019-10-15 NOTE — Chronic Care Management (AMB) (Signed)
Chronic Care Management    Clinical Social Work Follow Up Note  10/15/2019 Name: Amy [name] MRN: [id number] DOB: [birth year]  Amy [name] is a 84 y.o. year old female who is a primary care patient of Towanda Malkin, MD. The CCM team was consulted for assistance with Level of Care Concerns.   Review of patient status, including review of consultants reports, other relevant assessments, and collaboration with appropriate care team members and the patient's provider was performed as part of comprehensive patient evaluation and provision of chronic care management services.    SDOH (Social Determinants of Health) assessments performed: No    Outpatient Encounter Medications as of 10/15/2019  Medication Sig  . Accu-Chek Softclix Lancets lancets Use as instructed  . acetaminophen (TYLENOL) 325 MG tablet Take 325 mg by mouth every 6 (six) hours as needed.  [name] albuterol (VENTOLIN HFA) 108 (90 Base) MCG/ACT inhaler Inhale 1-2 puffs into the lungs every 4 (four) hours as needed.   [name] atorvastatin (LIPITOR) 20 MG tablet Take 1 tablet (20 mg total) by mouth daily at 6 PM.  . clopidogrel (PLAVIX) 75 MG tablet TAKE 1 TABLET EVERY DAY  . Cyanocobalamin (VITAMIN B12 PO) Take by mouth.  . donepezil (ARICEPT) 10 MG tablet Take 1 tablet (10 mg total) by mouth at bedtime.  . famotidine (PEPCID) 20 MG tablet Take 1 tablet (20 mg total) by mouth daily.  . ferrous sulfate 325 (65 FE) MG tablet Take 325 mg by mouth daily with breakfast. Pt taking every other day  . FLUoxetine (PROZAC) 10 MG capsule Take 1 capsule (10 mg total) by mouth daily.  [name] gabapentin (NEURONTIN) 300 MG capsule Take 1 capsule (300 mg total) by mouth 3 (three) times daily.  [name] HYDROcodone-acetaminophen (NORCO) 7.5-325 MG tablet Take 1 tablet by mouth every 8 (eight) hours as needed for moderate pain.  [name] ipratropium-albuterol (DUONEB) 0.5-2.5 (3) MG/3ML SOLN INHALE THE CONTENTS OF 1 VIAL VIA NEBULIZER THREE TIMES DAILY AS  DIRECTED  . pantoprazole (PROTONIX) 40 MG tablet Take 1 tablet (40 mg total) by mouth daily.  . QUEtiapine (SEROQUEL) 25 MG tablet Take 25 mg by mouth 2 (two) times daily. Takes at 2 pm and Total 75 mg a hs.  . QUEtiapine (SEROQUEL) 50 MG tablet Take 50 mg by mouth at bedtime.  . Thiamine HCl (VITAMIN B-1) 250 MG tablet Take 250 mg by mouth daily.   No facility-administered encounter medications on file as of 10/15/2019.     Goals Addressed            This Visit's Progress   . Per patient's spouse "I think I need to look in to long term care for my wife" (pt-stated)       CARE PLAN ENTRY (see longitudinal plan of care for additional care plan information)  Current Barriers:  . Level of care concerns  Clinical Social Work Clinical Goal(s):  [name] Over the next 90 days, patient's spouse will work with SW to address concerns related to patient's level of care needs  Interventions: . Patient's spouse confirmed court hearing to determine financial control  is scheduled for 10/30/19 . Patient's spouse continues to report actively working with the attorney to prepare patient for placement . Patient's spouse discussed that patient's condition continues to progress and that he is prepared to pay out of pocket until he can apply for medicaid . Explored facility care options and was provided with a list of memory care facilities to follow up on. [name]  This Education officer, museum discussed plan to request Fl2 from patient's doctor to begin bed search.  Patient Self Care Activities:  . Attends all scheduled provider appointments . Unable to perform ADLs independently . Unable to perform IADLs independently . Knowledge deficit of the out of home placement process  Please see past updates related to this goal by clicking on the "Past Updates" button in the selected goal          Follow Up Plan: SW will follow up with patient by phone over the next 7-10 days regarding status of [place],  [place] Worker  [facility] Care Management [phone]

## 2019-10-16 ENCOUNTER — Ambulatory Visit: Payer: Self-pay | Admitting: *Deleted

## 2019-10-16 ENCOUNTER — Encounter: Payer: Self-pay | Admitting: *Deleted

## 2019-10-16 DIAGNOSIS — J449 Chronic obstructive pulmonary disease, unspecified: Secondary | ICD-10-CM

## 2019-10-16 DIAGNOSIS — G301 Alzheimer's disease with late onset: Secondary | ICD-10-CM | POA: Diagnosis not present

## 2019-10-16 DIAGNOSIS — F0281 Dementia in other diseases classified elsewhere with behavioral disturbance: Secondary | ICD-10-CM | POA: Diagnosis not present

## 2019-10-16 DIAGNOSIS — I10 Essential (primary) hypertension: Secondary | ICD-10-CM

## 2019-10-16 NOTE — Progress Notes (Signed)
This encounter was created in error - please disregard.

## 2019-10-16 NOTE — Chronic Care Management (AMB) (Signed)
Chronic Care Management    Clinical Social Work Follow Up Note  10/16/2019 Name: Amy [name] MRN: [id number] DOB: 1936/02/26  Amy [name] is a 84 y.o. year old female who is a primary care patient of Towanda Malkin, MD. The CCM team was consulted for assistance with Level of Care Concerns.   Review of patient status, including review of consultants reports, other relevant assessments, and collaboration with appropriate care team members and the patient's Amy [name] was performed as part of comprehensive patient evaluation and provision of chronic care management services.    SDOH (Social Determinants of Health) assessments performed: No    Outpatient Encounter Medications as of 10/16/2019  Medication Sig  . Accu-Chek Softclix Lancets lancets Use as instructed  . acetaminophen (TYLENOL) 325 MG tablet Take 325 mg by mouth every 6 (six) hours as needed.  [name] albuterol (VENTOLIN HFA) 108 (90 Base) MCG/ACT inhaler Inhale 1-2 puffs into the lungs every 4 (four) hours as needed.   [name] atorvastatin (LIPITOR) 20 MG tablet Take 1 tablet (20 mg total) by mouth daily at 6 PM.  . clopidogrel (PLAVIX) 75 MG tablet TAKE 1 TABLET EVERY DAY  . Cyanocobalamin (VITAMIN B12 PO) Take by mouth.  . donepezil (ARICEPT) 10 MG tablet Take 1 tablet (10 mg total) by mouth at bedtime.  . famotidine (PEPCID) 20 MG tablet Take 1 tablet (20 mg total) by mouth daily.  . ferrous sulfate 325 (65 FE) MG tablet Take 325 mg by mouth daily with breakfast. Pt taking every other day  . FLUoxetine (PROZAC) 10 MG capsule Take 1 capsule (10 mg total) by mouth daily.  [name] gabapentin (NEURONTIN) 300 MG capsule Take 1 capsule (300 mg total) by mouth 3 (three) times daily.  [name] HYDROcodone-acetaminophen (NORCO) 7.5-325 MG tablet Take 1 tablet by mouth every 8 (eight) hours as needed for moderate pain.  [name] ipratropium-albuterol (DUONEB) 0.5-2.5 (3) MG/3ML SOLN INHALE THE CONTENTS OF 1 VIAL VIA NEBULIZER THREE TIMES DAILY AS  DIRECTED  . pantoprazole (PROTONIX) 40 MG tablet Take 1 tablet (40 mg total) by mouth daily.  . QUEtiapine (SEROQUEL) 25 MG tablet Take 25 mg by mouth 2 (two) times daily. Takes at 2 pm and Total 75 mg a hs.  . QUEtiapine (SEROQUEL) 50 MG tablet Take 50 mg by mouth at bedtime.  . Thiamine HCl (VITAMIN B-1) 250 MG tablet Take 250 mg by mouth daily.   No facility-administered encounter medications on file as of 10/16/2019.     Goals Addressed            This Visit's Progress   . Per patient's spouse "I think I need to look in to long term care for my wife" (pt-stated)       CARE PLAN ENTRY (see longitudinal plan of care for additional care plan information)  Current Barriers:  . Level of care concerns  Clinical Social Work Clinical Goal(s):  [name] Over the next 90 days, patient's spouse will work with SW to address concerns related to patient's level of care needs  Interventions: . Fl2 provided to patient's Amy [name] to complete to obtain level of care recommendation to begin bed search. Amy [name] to be faxed out to memory care facilities once received and will discuss bed offers to patient's spouse.  Patient Self Care Activities:  . Attends all scheduled Amy [name] appointments . Unable to perform ADLs independently . Unable to perform IADLs independently . Knowledge deficit of the out of home placement process  Please see past  updates related to this goal by clicking on the "Past Updates" button in the selected goal          Follow Up Plan: SW will follow up with patient by phone over the next 7-10 business days   Amy [name], [place] Worker  [facility] Care Management [phone]

## 2019-10-17 ENCOUNTER — Other Ambulatory Visit: Payer: Self-pay

## 2019-10-17 ENCOUNTER — Encounter: Payer: Medicare HMO | Admitting: Internal Medicine

## 2019-10-17 ENCOUNTER — Ambulatory Visit: Payer: Self-pay | Admitting: *Deleted

## 2019-10-17 DIAGNOSIS — N189 Chronic kidney disease, unspecified: Secondary | ICD-10-CM | POA: Diagnosis not present

## 2019-10-17 DIAGNOSIS — E1122 Type 2 diabetes mellitus with diabetic chronic kidney disease: Secondary | ICD-10-CM | POA: Diagnosis not present

## 2019-10-17 DIAGNOSIS — I129 Hypertensive chronic kidney disease with stage 1 through stage 4 chronic kidney disease, or unspecified chronic kidney disease: Secondary | ICD-10-CM | POA: Diagnosis not present

## 2019-10-17 DIAGNOSIS — I10 Essential (primary) hypertension: Secondary | ICD-10-CM

## 2019-10-17 DIAGNOSIS — G301 Alzheimer's disease with late onset: Secondary | ICD-10-CM

## 2019-10-17 DIAGNOSIS — Z905 Acquired absence of kidney: Secondary | ICD-10-CM | POA: Diagnosis not present

## 2019-10-17 DIAGNOSIS — I739 Peripheral vascular disease, unspecified: Secondary | ICD-10-CM | POA: Diagnosis not present

## 2019-10-17 DIAGNOSIS — Z85528 Personal history of other malignant neoplasm of kidney: Secondary | ICD-10-CM | POA: Diagnosis not present

## 2019-10-17 DIAGNOSIS — L89152 Pressure ulcer of sacral region, stage 2: Secondary | ICD-10-CM | POA: Diagnosis not present

## 2019-10-17 DIAGNOSIS — Z86718 Personal history of other venous thrombosis and embolism: Secondary | ICD-10-CM | POA: Diagnosis not present

## 2019-10-17 DIAGNOSIS — Z89612 Acquired absence of left leg above knee: Secondary | ICD-10-CM | POA: Diagnosis not present

## 2019-10-17 NOTE — Chronic Care Management (AMB) (Signed)
Chronic Care Management    Clinical Social Work Follow Up Note  10/17/2019 Name: Amy [name] MRN: [id number] DOB: Sep 23, 1935  Amy [name] is a 84 y.o. year old female who is a primary care patient of Towanda Malkin, MD. The CCM team was consulted for assistance with Level of Care Concerns.   Review of patient status, including review of consultants reports, other relevant assessments, and collaboration with appropriate care team members and the patient's provider was performed as part of comprehensive patient evaluation and provision of chronic care management services.    SDOH (Social Determinants of Health) assessments performed: No    Outpatient Encounter Medications as of 10/17/2019  Medication Sig  . Accu-Chek Softclix Lancets lancets Use as instructed  . acetaminophen (TYLENOL) 325 MG tablet Take 325 mg by mouth every 6 (six) hours as needed.  [name] albuterol (VENTOLIN HFA) 108 (90 Base) MCG/ACT inhaler Inhale 1-2 puffs into the lungs every 4 (four) hours as needed.   [name] atorvastatin (LIPITOR) 20 MG tablet Take 1 tablet (20 mg total) by mouth daily at 6 PM.  . clopidogrel (PLAVIX) 75 MG tablet TAKE 1 TABLET EVERY DAY  . Cyanocobalamin (VITAMIN B12 PO) Take by mouth.  . donepezil (ARICEPT) 10 MG tablet Take 1 tablet (10 mg total) by mouth at bedtime.  . famotidine (PEPCID) 20 MG tablet Take 1 tablet (20 mg total) by mouth daily.  . ferrous sulfate 325 (65 FE) MG tablet Take 325 mg by mouth daily with breakfast. Pt taking every other day  . FLUoxetine (PROZAC) 10 MG capsule Take 1 capsule (10 mg total) by mouth daily.  [name] gabapentin (NEURONTIN) 300 MG capsule Take 1 capsule (300 mg total) by mouth 3 (three) times daily.  [name] HYDROcodone-acetaminophen (NORCO) 7.5-325 MG tablet Take 1 tablet by mouth every 8 (eight) hours as needed for moderate pain.  [name] ipratropium-albuterol (DUONEB) 0.5-2.5 (3) MG/3ML SOLN INHALE THE CONTENTS OF 1 VIAL VIA NEBULIZER THREE TIMES DAILY AS  DIRECTED  . pantoprazole (PROTONIX) 40 MG tablet Take 1 tablet (40 mg total) by mouth daily.  . QUEtiapine (SEROQUEL) 25 MG tablet Take 25 mg by mouth 2 (two) times daily. Takes at 2 pm and Total 75 mg a hs.  . QUEtiapine (SEROQUEL) 50 MG tablet Take 50 mg by mouth at bedtime.  . Thiamine HCl (VITAMIN B-1) 250 MG tablet Take 250 mg by mouth daily.   No facility-administered encounter medications on file as of 10/17/2019.     Goals Addressed            This Visit's Progress   . Per patient's spouse "I think I need to look in to long term care for my wife" (pt-stated)       CARE PLAN ENTRY (see longitudinal plan of care for additional care plan information)  Current Barriers:  . Level of care concerns  Clinical Social Work Clinical Goal(s):  [name] Over the next 90 days, patient's spouse will work with SW to address concerns related to patient's level of care needs  Interventions:  . Phone call from patient's spouse to inquire about patient's placement . This Education officer, museum explained that the [place] has been provided to patient's provider to complete to obtain level of care recommendation to begin bed search. Phoebe Perch to be faxed out to memory care facilities once received and will discuss bed offers to patient's spouse.  Patient Self Care Activities:  . Attends all scheduled provider appointments . Unable to perform ADLs independently .  Unable to perform IADLs independently . Knowledge deficit of the out of home placement process  Please see past updates related to this goal by clicking on the "Past Updates" button in the selected goal          Follow Up Plan: SW will follow up with patient by phone over the next 7-10 business days   Elliot Gurney, [place] Worker  [facility] Care Management [phone]

## 2019-10-17 NOTE — Patient Instructions (Addendum)
Thank you allowing the Chronic Care Management Team to be a part of your care! It was a pleasure speaking with you today!  1. Please call this social worker with any questions or concerns regarding patient's level of care needs.  CCM (Chronic Care Management) Team   Neldon Labella  RN, BSN Nurse Care Coordinator  [phone]  [place], LCSW Clinical Social Worker [phone]  Goals Addressed            This Visit's Progress   . Per patient's spouse "I think I need to look in to long term care for my wife" (pt-stated)       CARE PLAN ENTRY (see longitudinal plan of care for additional care plan information)  Current Barriers:  . Level of care concerns  Clinical Social Work Clinical Goal(s):  [name] Over the next 90 days, patient's spouse will work with SW to address concerns related to patient's level of care needs  Interventions:  . Phone call from patient's spouse to inquire about patient's placement . This Education officer, museum explained that the [place] has been provided to patient's provider to complete to obtain level of care recommendation to begin bed search. Phoebe Perch to be faxed out to memory care facilities once received and will discuss bed offers to patient's spouse.  Patient Self Care Activities:  . Attends all scheduled provider appointments . Unable to perform ADLs independently . Unable to perform IADLs independently . Knowledge deficit of the out of home placement process  Please see past updates related to this goal by clicking on the "Past Updates" button in the selected goal          The patient verbalized understanding of instructions provided today and declined a print copy of patient instruction materials.   Telephone follow up appointment with care management team member scheduled for: 10/25/19

## 2019-10-18 ENCOUNTER — Encounter: Payer: Self-pay | Admitting: Student in an Organized Health Care Education/Training Program

## 2019-10-18 ENCOUNTER — Other Ambulatory Visit: Payer: Self-pay

## 2019-10-18 ENCOUNTER — Telehealth: Payer: Self-pay

## 2019-10-18 ENCOUNTER — Ambulatory Visit
Payer: Medicare HMO | Attending: Student in an Organized Health Care Education/Training Program | Admitting: Student in an Organized Health Care Education/Training Program

## 2019-10-18 DIAGNOSIS — G546 Phantom limb syndrome with pain: Secondary | ICD-10-CM | POA: Diagnosis not present

## 2019-10-18 DIAGNOSIS — Z89612 Acquired absence of left leg above knee: Secondary | ICD-10-CM

## 2019-10-18 DIAGNOSIS — G894 Chronic pain syndrome: Secondary | ICD-10-CM

## 2019-10-18 DIAGNOSIS — I700 Atherosclerosis of aorta: Secondary | ICD-10-CM

## 2019-10-18 DIAGNOSIS — Z905 Acquired absence of kidney: Secondary | ICD-10-CM | POA: Diagnosis not present

## 2019-10-18 DIAGNOSIS — F1120 Opioid dependence, uncomplicated: Secondary | ICD-10-CM

## 2019-10-18 MED ORDER — HYDROCODONE-ACETAMINOPHEN 7.5-325 MG PO TABS: 1.0000 | ORAL_TABLET | Freq: Three times a day (TID) | ORAL | 0 refills | Status: AC | PRN

## 2019-10-18 MED ORDER — HYDROCODONE-ACETAMINOPHEN 7.5-325 MG PO TABS: 1.0000 | ORAL_TABLET | Freq: Three times a day (TID) | ORAL | 0 refills | Status: DC | PRN

## 2019-10-18 MED ORDER — ATORVASTATIN CALCIUM 20 MG PO TABS: 20.0000 mg | ORAL_TABLET | Freq: Every day | ORAL | 3 refills | Status: AC

## 2019-10-18 NOTE — Telephone Encounter (Signed)
Pt was called with no answer. °

## 2019-10-18 NOTE — Progress Notes (Signed)
Brigham, [name] ([id number]) Visit Report for 10/17/2019 Arrival Information Details Patient Name: Amy [name], Amy [name]. Date of Service: 10/17/2019 8:30 AM Medical Record Number: [id number] Patient Account Number: [id number] Date of Birth/Sex: 10/21/1935 (83 y.o. F) Treating RN: Amy [name] Primary Care Amy [name]: [name], Amy Other Clinician: Referring Amy [name]: Amy [name] Treating Amy [name]/Extender: Amy [name] in Treatment: 5 Visit Information History Since Last Visit Added or deleted any medications: No Patient Arrived: Wheel Chair Any new allergies or adverse reactions: No Arrival Time: 08:29 Had a fall or experienced change in No Accompanied By: husband activities of daily living that may affect Transfer Assistance: None risk of falls: Patient Identification Verified: Yes Signs or symptoms of abuse/neglect since last visito No Hospitalized since last visit: No Has Dressing in Place as Prescribed: Yes Pain Present Now: No Electronic Signature(s) Signed: 10/17/2019 11:17:30 AM By: Amy [name] Entered By: Amy [name] on 10/17/2019 08:30:04 Poupard, [name] ([id number]) -------------------------------------------------------------------------------- Clinic Level of Care Assessment Details Patient Name: Amy [name], [name] Date of Service: 10/17/2019 8:30 AM Medical Record Number: [id number] Patient Account Number: [id number] Date of Birth/Sex: Oct 02, 1935 (83 y.o. F) Treating RN: Amy [name] Primary Care Wilhemina Grall: [name], Amy Other Clinician: Referring Jlon Betker: Amy [name] Treating Amy [name]/Extender: Amy [name] in Treatment: 5 Clinic Level of Care Assessment Items TOOL 4 Quantity Score []  - Use when only an EandM is performed on FOLLOW-UP visit 0 ASSESSMENTS - Nursing Assessment / Reassessment X - Reassessment of Co-morbidities (includes updates in patient status) 1 10 X- 1 5 Reassessment of Adherence to Treatment  Plan ASSESSMENTS - Wound and Skin Assessment / Reassessment X - Simple Wound Assessment / Reassessment - one wound 1 5 []  - 0 Complex Wound Assessment / Reassessment - multiple wounds []  - 0 Dermatologic / Skin Assessment (not related to wound area) ASSESSMENTS - Focused Assessment []  - Circumferential Edema Measurements - multi extremities 0 []  - 0 Nutritional Assessment / Counseling / Intervention []  - 0 Lower Extremity Assessment (monofilament, tuning fork, pulses) []  - 0 Peripheral Arterial Disease Assessment (using hand held doppler) ASSESSMENTS - Ostomy and/or Continence Assessment and Care []  - Incontinence Assessment and Management 0 []  - 0 Ostomy Care Assessment and Management (repouching, etc.) PROCESS - Coordination of Care X - Simple Patient / Family Education for ongoing care 1 15 []  - 0 Complex (extensive) Patient / Family Education for ongoing care []  - 0 Staff obtains Programmer, systems, Records, Test Results / Process Orders []  - 0 Staff telephones HHA, Nursing Homes / Clarify orders / etc []  - 0 Routine Transfer to another Facility (non-emergent condition) []  - 0 Routine Hospital Admission (non-emergent condition) []  - 0 New Admissions / Biomedical engineer / Ordering NPWT, Apligraf, etc. []  - 0 Emergency Hospital Admission (emergent condition) X- 1 10 Simple Discharge Coordination []  - 0 Complex (extensive) Discharge Coordination PROCESS - Special Needs []  - Pediatric / Minor Patient Management 0 []  - 0 Isolation Patient Management []  - 0 Hearing / Language / Visual special needs []  - 0 Assessment of Community assistance (transportation, D/C planning, etc.) []  - 0 Additional assistance / Altered mentation []  - 0 Support Surface(s) Assessment (bed, cushion, seat, etc.) INTERVENTIONS - Wound Cleansing / Measurement Livesay, [name] ([id number]) X- 1 5 Simple Wound Cleansing - one wound []  - 0 Complex Wound Cleansing - multiple wounds X- 1  5 Wound Imaging (photographs - any number of wounds) []  - 0 Wound Tracing (instead of photographs) X- 1 5 Simple Wound Measurement - one wound []  - 0  Complex Wound Measurement - multiple wounds INTERVENTIONS - Wound Dressings []  - Small Wound Dressing one or multiple wounds 0 []  - 0 Medium Wound Dressing one or multiple wounds []  - 0 Large Wound Dressing one or multiple wounds []  - 0 Application of Medications - topical []  - 0 Application of Medications - injection INTERVENTIONS - Miscellaneous []  - External ear exam 0 []  - 0 Specimen Collection (cultures, biopsies, blood, body fluids, etc.) []  - 0 Specimen(s) / Culture(s) sent or taken to Lab for analysis X- 1 10 Patient Transfer (multiple staff / Civil Service fast streamer / Similar devices) []  - 0 Simple Staple / Suture removal (25 or less) []  - 0 Complex Staple / Suture removal (26 or more) []  - 0 Hypo / Hyperglycemic Management (close monitor of Blood Glucose) []  - 0 Ankle / Brachial Index (ABI) - do not check if billed separately X- 1 5 Vital Signs Has the patient been seen at the hospital within the last three years: Yes Total Score: 75 Level Of Care: New/Established - Level 2 Electronic Signature(s) Signed: 10/17/2019 5:27:22 PM By: Amy [name], BSN, RN, CWS, Amy [name] Entered By: Amy [name], BSN, RN, CWS, Amy on 10/17/2019 08:44:09 Stanly, [name] ([id number]) -------------------------------------------------------------------------------- Encounter Discharge Information Details Patient Name: Amy [name], [name] Date of Service: 10/17/2019 8:30 AM Medical Record Number: [id number] Patient Account Number: [id number] Date of Birth/Sex: [birth year] (83 y.o. F) Treating RN: Amy [name] Primary Care Curren Mohrmann: [name], Amy Other Clinician: Referring Belladonna Lubinski: Amy [name] Treating Jeramie Scogin/Extender: Amy [name] in Treatment: 5 Encounter Discharge Information Items Discharge Condition: Stable Ambulatory Status:  Wheelchair Discharge Destination: Home Transportation: Private Auto Accompanied By: spouse Schedule Follow-up Appointment: No Clinical Summary of Care: Electronic Signature(s) Signed: 10/17/2019 5:27:22 PM By: Amy [name], BSN, RN, CWS, Amy [name] Entered By: Amy [name], BSN, RN, CWS, Amy on 10/17/2019 08:45:26 Hanel, Hyla L. ([id number]) -------------------------------------------------------------------------------- Lower Extremity Assessment Details Patient Name: Amy [name], [name] Date of Service: 10/17/2019 8:30 AM Medical Record Number: [id number] Patient Account Number: [id number] Date of Birth/Sex: 1936-05-12 (83 y.o. F) Treating RN: Amy [name] Primary Care Akeiba Axelson: Roxan Hockey Amy Other Clinician: Referring Margorie Renner: Amy [name] Treating Erick Murin/Extender: Amy [name] in Treatment: 5 Electronic Signature(s) Signed: 10/17/2019 11:17:30 AM By: Amy [name] Entered By: Amy [name] on 10/17/2019 08:34:58 Amy, Abriana L. ([id number]) -------------------------------------------------------------------------------- Multi Wound Chart Details Patient Name: Amy [name], [name] Date of Service: 10/17/2019 8:30 AM Medical Record Number: [id number] Patient Account Number: [id number] Date of Birth/Sex: [birth year] (83 y.o. F) Treating RN: Amy [name] Primary Care Carmon Brigandi: [name], Amy Other Clinician: Referring Jakeisha Stricker: Amy [name] Treating Xavior Niazi/Extender: Amy [name] in Treatment: 5 Vital Signs Height(in): Pulse(bpm): 88 Weight(lbs): Blood Pressure(mmHg): 142/72 Body Mass Index(BMI): Temperature(F): 98.1 Respiratory Rate(breaths/min): 16 Photos: [N/A:N/A] Wound Location: Midline Gluteus N/A N/A Wounding Event: Pressure Injury N/A N/A Primary Etiology: Pressure Ulcer N/A N/A Secondary Etiology: MASD N/A N/A Comorbid History: Cataracts, Anemia, Hypertension, N/A N/A Peripheral Arterial Disease, Type II Diabetes, Osteoarthritis,  Dementia, Received Radiation Date Acquired: 08/20/2019 N/A N/A Weeks of Treatment: 5 N/A N/A Wound Status: Open N/A N/A Clustered Wound: Yes N/A N/A Clustered Quantity: 1 N/A N/A Measurements L x W x D (cm) 0x0x0 N/A N/A Area (cm) : 0 N/A N/A Volume (cm) : 0 N/A N/A % Reduction in Area: 100.00% N/A N/A % Reduction in Volume: 100.00% N/A N/A Classification: Category/Stage II N/A N/A Exudate Amount: None Present N/A N/A Wound Margin: Flat and Intact N/A N/A Granulation Amount: None Present (0%) N/A N/A Necrotic Amount: None Present (0%) N/A N/A  Exposed Structures: Fascia: No N/A N/A Fat Layer (Subcutaneous Tissue) Exposed: No Tendon: No Muscle: No Joint: No Bone: No Epithelialization: Large (67-100%) N/A N/A Treatment Notes Electronic Signature(s) Signed: 10/17/2019 5:27:22 PM By: Amy [name], BSN, RN, CWS, Amy [name] Entered By: Amy [name], BSN, RN, CWS, Amy on 10/17/2019 08:42:16 Amy [name], [name] ([id number]) -------------------------------------------------------------------------------- Spur Details Patient Name: Amy [name], LASURE. Date of Service: 10/17/2019 8:30 AM Medical Record Number: [id number] Patient Account Number: [id number] Date of Birth/Sex: [birth year] (83 y.o. F) Treating RN: Amy [name] Primary Care Morrison Masser: Roxan Hockey, Amy Other Clinician: Referring Albirtha Grinage: Amy [name] Treating Eleno Weimar/Extender: Amy [name] in Treatment: 5 Active Inactive Electronic Signature(s) Signed: 10/17/2019 5:27:22 PM By: Amy [name], BSN, RN, CWS, Amy [name] Entered By: Amy [name], BSN, RN, CWS, Amy on 10/17/2019 08:42:07 Amy [name], [name] ([id number]) -------------------------------------------------------------------------------- Pain Assessment Details Patient Name: Amy [name], [name] Date of Service: 10/17/2019 8:30 AM Medical Record Number: [id number] Patient Account Number: [id number] Date of Birth/Sex: November 28, 1935 (83 y.o. F) Treating  RN: Amy [name] Primary Care Judeth Gilles: [name], Amy Other Clinician: Referring Wilkie Zenon: Amy [name] Treating Amy Harada/Extender: Amy [name] in Treatment: 5 Active Problems Location of Pain Severity and Description of Pain Patient Has Paino No Site Locations Pain Management and Medication Current Pain Management: Electronic Signature(s) Signed: 10/17/2019 11:17:30 AM By: Amy [name] Entered By: Amy [name] on 10/17/2019 08:34:06 Lento, Lagina L. ([id number]) -------------------------------------------------------------------------------- Patient/Caregiver Education Details Patient Name: Amy [name], [name] Date of Service: 10/17/2019 8:30 AM Medical Record Number: [id number] Patient Account Number: [id number] Date of Birth/Gender: 05-14-1936 (84 y.o. F) Treating RN: Amy [name] Primary Care Physician: Lebron Conners Other Clinician: Referring Physician: Raelyn [name] Treating Physician/Extender: Amy [name] in Treatment: 5 Education Assessment Education Provided To: Caregiver Education Topics Provided Pressure: Handouts: Preventing Pressure Ulcers, Other: remind patient to shift weight in chair Methods: Demonstration, Explain/Verbal Wound/Skin Impairment: Electronic Signature(s) Signed: 10/17/2019 5:27:22 PM By: Amy [name], BSN, RN, CWS, Amy [name] Entered By: Amy [name], BSN, RN, CWS, Amy on 10/17/2019 08:45:04 Amy [name], [name] ([id number]) -------------------------------------------------------------------------------- Wound Assessment Details Patient Name: Amy [name], [name] Date of Service: 10/17/2019 8:30 AM Medical Record Number: [id number] Patient Account Number: [id number] Date of Birth/Sex: 1936/04/22 (83 y.o. F) Treating RN: Amy [name] Primary Care Camreigh Michie: [name], Amy Other Clinician: Referring Malani Lees: Amy [name] Treating Jencarlos Nicolson/Extender: Amy [name] in Treatment: 5 Wound Status Wound Number:  5 Primary Pressure Ulcer Etiology: Wound Location: Midline Gluteus Secondary MASD Wounding Event: Pressure Injury Etiology: Date Acquired: 08/20/2019 Wound Open Weeks Of Treatment: 5 Status: Clustered Wound: Yes Comorbid Cataracts, Anemia, Hypertension, Peripheral Arterial History: Disease, Type II Diabetes, Osteoarthritis, Dementia, Received Radiation Photos Wound Measurements Length: (cm) 0 Width: (cm) 0 Depth: (cm) 0 Clustered Quantity: 1 Area: (cm) 0 Volume: (cm) 0 % Reduction in Area: 100% % Reduction in Volume: 100% Epithelialization: Large (67-100%) Wound Description Classification: Category/Stage II Wound Margin: Flat and Intact Exudate Amount: None Present Foul Odor After Cleansing: No Slough/Fibrino No Wound Bed Granulation Amount: None Present (0%) Exposed Structure Necrotic Amount: None Present (0%) Fascia Exposed: No Fat Layer (Subcutaneous Tissue) Exposed: No Tendon Exposed: No Muscle Exposed: No Joint Exposed: No Bone Exposed: No Electronic Signature(s) Signed: 10/17/2019 11:17:30 AM By: Amy [name] Entered By: Amy [name] on 10/17/2019 08:34:48 Donegan, Jaylenne L. ([id number]) -------------------------------------------------------------------------------- Vitals Details Patient Name: KEELEY, SUSSMAN L. Date of Service: 10/17/2019 8:30 AM Medical Record Number: [id number] Patient Account Number: [id number] Date of Birth/Sex: [birth year] (83 y.o. F) Treating RN: Amy [name] Primary Care Joal Eakle: [name], Amy Other Clinician: Referring Rayden Scheper: Amy [name]  Treating Peretz Thieme/Extender: Amy [name] in Treatment: 5 Vital Signs Time Taken: 08:30 Temperature (F): 98.1 Pulse (bpm): 79 Respiratory Rate (breaths/min): 16 Blood Pressure (mmHg): 142/72 Reference Range: 80 - 120 mg / dl Electronic Signature(s) Signed: 10/17/2019 11:17:30 AM By: Amy [name] Entered By: Amy [name] on 10/17/2019 08:31:15

## 2019-10-18 NOTE — Progress Notes (Signed)
Meneely, Korianna L. ([id number]) Visit Report for 10/17/2019 HPI Details Patient Name: Amy [name], Amy [name]. Date of Service: 10/17/2019 8:30 AM Medical Record Number: [id number] Patient Account Number: [id number] Date of Birth/Sex: [birth year] (84 y.o. F) Treating RN: Cornell Barman Primary Care Provider: HENDRICKSON, CLIFFORD Other Clinician: Referring Provider: Raelyn Ensign Treating Provider/Extender: [name] Gust in Treatment: 5 History of Present Illness Associated Signs and Symptoms: The patient has a history of chronic kidney disease, diabetes mellitus type II although her hemoglobin A1c most recent was 5.8 and she is on the medications. 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 She is incontinent. HPI Description: 03/10/18 on evaluation today patient actually presents for evaluation concerning her bilateral gluteal ulcers which have been present for several weeks. She has been treated over time with different creams most recently polysporin. With that being said the patient actually seems to be doing somewhat better at this point in time during evaluation today. Nonetheless she still does have an opening mainly on the left side the right pretty much appears to be closed in my opinion. She does have pain on the left gluteal region. She also tells me about an area under the abdominal fold in the lower abdominal region which appears to be new just yesterday or today. This appears to potentially be a little bit of a fungal rash versus possibly friction from the way her husband has to help her with transfer. No fevers, chills, nausea, or vomiting noted at this time. Patient has dementia and has a very difficult time sometimes following directions. Her husband does the best he can taking care for home but he states is difficult sometimes to get her to do  anything but just sit either in her wheelchair or the recliner which is probably why she's having such issues with her gluteal region. Nonetheless considering how much time she seems to spend sitting I'm pleased that the wounds are not any worse in appearance. 03/17/18 on evaluation today patient actually appears to be completely healed in regard to her ulcers on her gluteal region. She has been tolerating the dressing changes without complication. Her abdominal area also appears to be doing better there is no opening at the site either she continues to use the cream that was prescribed at the last visit. That is the next one to one of zinc oxide and nystatin cream. Overall this seems to be doing very well at this point in time. Readmission: 04/09/2019 on evaluation today patient appears to be doing somewhat more poorly. This is a patient whom I saw almost a year ago. She has had some discoloration in her gluteal region in general since that time. Again I think this is always seemingly due to excessive pressure to the area. She does have her husband with her who is actually her primary caregiver and does a very good job with her in my opinion. With that being said he obviously is somewhat frustrated in general with the fact that this keeps staying open he is also frustrated with the fact that she seems to not really want to stay in the bed to offload and lay on her side she likes to lay on her back he is having a very hard time finding a way to help her keep pressure off of the region. She does have dementia and this is playing a big role as well. Subsequently  he did try to get her a different mattress to go underneath her chair in order to help with offloading but she would not even give it a try. Subsequently he did try donut as well but that just she seems to be moving. He really cannot keep that in the right place. I suggested to him that a piece of memory foam may be better as far as not having  to worry about exact positioning this would help with giving additional pressure offloading support. 04/16/2019 on evaluation today patient actually appears to be completely healed based on what I am seeing. Unfortunately she did not like the [facility] along with a border foam dressing that we ordered at all. She complained that it hurt as soon as she sat on it and she did not want to wear it. Subsequently they ended up discontinuing that and have just been using the Desitin at this point. He did actually have his son help him to put a memory foam mattress on the bed and he also had several pieces he was able to cut to make memory foam cushions for her chair and that has seemed to make a dramatic improvement with regard to the pressure to her gluteal region. With that being said the patient also had been complaining of back pain pretty much every morning and also has not been complaining of this either. Overall he is actually very pleased with how things are doing with the memory foam. READMISSION 09/12/2019 This is a now 84 year old woman we have seen in clinic on 2 separate occasions but I do not believe I have seen her in the past. She was here in 2018 and again in 2020 for gluteal wounds which were felt to be pressure. She has dementia when she sits and lies on her bed on her buttocks and according to her husband there is just no way to get her off this. Complicating the issue is she has urinary incontinence and although her husband attempts to change this frequently he often finds the area wet with urine. The patient is nonambulatory. She is in a wheelchair or lying on her back in bed. She does have memory foam over the mattress in the wheelchair. She is here today as she is felt to have new wounds. Our intake and nurse initially did not notice anything but some excoriation on the skin of her buttocks around the gluteal cleft. Past medical history includes type 2 diabetes, left AKA, urinary  incontinence, prior history of left nephrectomy for malignancy, dementia, hypertension, history of PAD. She has been on oxygen for 2 L for about a year. I am not sure what the primary diagnosis is. 5/5; 2-week follow-up. This is a patient with a very narrow linear wound in the lower sacrum coccyx area. She has been using silver alginate. Her husband is keeping her off this as best he can 5/12 I thought this patient might be healed this week. She has a very narrow linear but difficult wound in the lower coccyx area. She has been using silver alginate. 10/17/19-This wound has healed, the narrow linear crack in the coccyx area has no open area Amy [name], [name] ([id number]) Electronic Signature(s) Signed: 10/17/2019 8:43:39 AM By: Tobi Bastos MD, MBA Entered By: Tobi Bastos on 10/17/2019 08:43:38 Amy [name], [name] ([id number]) -------------------------------------------------------------------------------- Physical Exam Details Patient Name: Amy [name], [name] Date of Service: 10/17/2019 8:30 AM Medical Record Number: [id number] Patient Account Number: [id number] Date of Birth/Sex: 1935/09/02 (84 y.o. F) Treating RN:  Cornell Barman Primary Care Provider: HENDRICKSON, CLIFFORD Other Clinician: Referring Provider: Raelyn Ensign Treating Provider/Extender: [name] Gust in Treatment: 5 Constitutional alert and oriented x 3. sitting or standing blood pressure is within target range for patient.. supine blood pressure is within target range for patient.. pulse regular and within target range for patient.[name] respirations regular, non-labored and within target range for patient.[name] temperature within target range for patient.. . . Well-nourished and well-hydrated in no acute distress. Notes No open areas noted in the coccyx, there is a very small abrasion area on the left buttock is too small to call an open wound Electronic Signature(s) Signed: 10/17/2019 8:44:09 AM By: Tobi Bastos  MD, MBA Entered By: Tobi Bastos on 10/17/2019 08:44:09 Petrey, Alexah L. ([id number]) -------------------------------------------------------------------------------- Physician Orders Details Patient Name: LASHARON, DUNIVAN L. Date of Service: 10/17/2019 8:30 AM Medical Record Number: [id number] Patient Account Number: [id number] Date of Birth/Sex: 1935/08/29 (84 y.o. F) Treating RN: Cornell Barman Primary Care Provider: Roxan Hockey, CLIFFORD Other Clinician: Referring Provider: Raelyn Ensign Treating Provider/Extender: [name] Gust in Treatment: 5 Verbal / Phone Orders: No Diagnosis Coding Discharge From [facility] o Discharge from [facility] - treatment complete Electronic Signature(s) Signed: 10/17/2019 3:57:01 PM By: Tobi Bastos MD, MBA Signed: 10/17/2019 5:27:22 PM By: Gretta Cool, BSN, RN, CWS, Kim RN, BSN Entered By: Gretta Cool, BSN, RN, CWS, Kim on 10/17/2019 08:43:35 Hedding, Vicki L. ([id number]) -------------------------------------------------------------------------------- Progress Note Details Patient Name: Amy [name], [name] Date of Service: 10/17/2019 8:30 AM Medical Record Number: [id number] Patient Account Number: [id number] Date of Birth/Sex: 1936-01-09 (84 y.o. F) Treating RN: Cornell Barman Primary Care Provider: HENDRICKSON, CLIFFORD Other Clinician: Referring Provider: Raelyn Ensign Treating Provider/Extender: [name] Gust in Treatment: 5 Subjective History of Present Illness (HPI) The following HPI elements were documented for the patient's wound: Associated Signs and Symptoms: The patient has a history of chronic kidney disease, diabetes mellitus type II although her hemoglobin A1c most recent was 5.8 and she is on the medications. 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 She is incontinent. 03/10/18 on  evaluation today patient actually presents for evaluation concerning her bilateral gluteal ulcers which have been present for several weeks. She has been treated over time with different creams most recently polysporin. With that being said the patient actually seems to be doing somewhat better at this point in time during evaluation today. Nonetheless she still does have an opening mainly on the left side the right pretty much appears to be closed in my opinion. She does have pain on the left gluteal region. She also tells me about an area under the abdominal fold in the lower abdominal region which appears to be new just yesterday or today. This appears to potentially be a little bit of a fungal rash versus possibly friction from the way her husband has to help her with transfer. No fevers, chills, nausea, or vomiting noted at this time. Patient has dementia and has a very difficult time sometimes following directions. Her husband does the best he can taking care for home but he states is difficult sometimes to get her to do anything but just sit either in her wheelchair or the recliner which is probably why she's having such issues with her gluteal region. Nonetheless considering how much time she seems to spend sitting I'm pleased that the wounds are not any worse in appearance. 03/17/18 on evaluation  today patient actually appears to be completely healed in regard to her ulcers on her gluteal region. She has been tolerating the dressing changes without complication. Her abdominal area also appears to be doing better there is no opening at the site either she continues to use the cream that was prescribed at the last visit. That is the next one to one of zinc oxide and nystatin cream. Overall this seems to be doing very well at this point in time. Readmission: 04/09/2019 on evaluation today patient appears to be doing somewhat more poorly. This is a patient whom I saw almost a year ago. She has  had some discoloration in her gluteal region in general since that time. Again I think this is always seemingly due to excessive pressure to the area. She does have her husband with her who is actually her primary caregiver and does a very good job with her in my opinion. With that being said he obviously is somewhat frustrated in general with the fact that this keeps staying open he is also frustrated with the fact that she seems to not really want to stay in the bed to offload and lay on her side she likes to lay on her back he is having a very hard time finding a way to help her keep pressure off of the region. She does have dementia and this is playing a big role as well. Subsequently he did try to get her a different mattress to go underneath her chair in order to help with offloading but she would not even give it a try. Subsequently he did try donut as well but that just she seems to be moving. He really cannot keep that in the right place. I suggested to him that a piece of memory foam may be better as far as not having to worry about exact positioning this would help with giving additional pressure offloading support. 04/16/2019 on evaluation today patient actually appears to be completely healed based on what I am seeing. Unfortunately she did not like the [facility] along with a border foam dressing that we ordered at all. She complained that it hurt as soon as she sat on it and she did not want to wear it. Subsequently they ended up discontinuing that and have just been using the Desitin at this point. He did actually have his son help him to put a memory foam mattress on the bed and he also had several pieces he was able to cut to make memory foam cushions for her chair and that has seemed to make a dramatic improvement with regard to the pressure to her gluteal region. With that being said the patient also had been complaining of back pain pretty much every morning and also has not  been complaining of this either. Overall he is actually very pleased with how things are doing with the memory foam. READMISSION 09/12/2019 This is a now 84 year old woman we have seen in clinic on 2 separate occasions but I do not believe I have seen her in the past. She was here in 2018 and again in 2020 for gluteal wounds which were felt to be pressure. She has dementia when she sits and lies on her bed on her buttocks and according to her husband there is just no way to get her off this. Complicating the issue is she has urinary incontinence and although her husband attempts to change this frequently he often finds the area wet with urine. The patient  is nonambulatory. She is in a wheelchair or lying on her back in bed. She does have memory foam over the mattress in the wheelchair. She is here today as she is felt to have new wounds. Our intake and nurse initially did not notice anything but some excoriation on the skin of her buttocks around the gluteal cleft. Past medical history includes type 2 diabetes, left AKA, urinary incontinence, prior history of left nephrectomy for malignancy, dementia, hypertension, history of PAD. She has been on oxygen for 2 L for about a year. I am not sure what the primary diagnosis is. 5/5; 2-week follow-up. This is a patient with a very narrow linear wound in the lower sacrum coccyx area. She has been using silver alginate. Her husband is keeping her off this as best he can 5/12 I thought this patient might be healed this week. She has a very narrow linear but difficult wound in the lower coccyx area. She has been using silver alginate. 10/17/19-This wound has healed, the narrow linear crack in the coccyx area has no open area Amy [name], [name] ([id number]) Objective Constitutional alert and oriented x 3. sitting or standing blood pressure is within target range for patient.. supine blood pressure is within target range for patient.. pulse regular and  within target range for patient.[name] respirations regular, non-labored and within target range for patient.[name] temperature within target range for patient.. Well-nourished and well-hydrated in no acute distress. Vitals Time Taken: 8:30 AM, Temperature: 98.1 F, Pulse: 79 bpm, Respiratory Rate: 16 breaths/min, Blood Pressure: 142/72 mmHg. General Notes: No open areas noted in the coccyx, there is a very small abrasion area on the left buttock is too small to call an open wound Integumentary (Hair, Skin) Wound #5 status is Open. Original cause of wound was Pressure Injury. The wound is located on the Midline Gluteus. The wound measures 0cm length x 0cm width x 0cm depth; 0cm^2 area and 0cm^3 volume. There is a none present amount of drainage noted. The wound margin is flat and intact. There is no granulation within the wound bed. There is no necrotic tissue within the wound bed. Plan Discharge From [facility]: Discharge from [facility] linear wound has healed, no open areas noted, patient can be discharged from the clinic, discussed with patient spouse about offloading and he does state it is a challenge given the patient has dementia and does not always comply Electronic Signature(s) Signed: 10/17/2019 8:44:58 AM By: Tobi Bastos MD, MBA Entered By: Tobi Bastos on 10/17/2019 08:44:57 Boyadjian, Tyrese L. ([id number]) -------------------------------------------------------------------------------- SuperBill Details Patient Name: Amy [name], [name] Date of Service: 10/17/2019 Medical Record Number: [id number] Patient Account Number: [id number] Date of Birth/Sex: 02-Jan-1936 (84 y.o. F) Treating RN: Cornell Barman Primary Care Provider: HENDRICKSON, CLIFFORD Other Clinician: Referring Provider: Raelyn Ensign Treating Provider/Extender: [name] Gust in Treatment: 5 Diagnosis Coding ICD-10 Codes Code Description Z60.109 Pressure ulcer of sacral  region, stage 2 Facility Procedures CPT4 Code: [id number] Description: [phone] - WOUND CARE VISIT-LEV 2 EST PT Modifier: Quantity: 1 Physician Procedures CPT4 Code: [id number] Description: 62831 - WC PHYS LEVEL 2 - EST PT Modifier: Quantity: 1 CPT4 Code: Description: ICD-10 Diagnosis Description L89.152 Pressure ulcer of sacral region, stage 2 Modifier: Quantity: Electronic Signature(s) Signed: 10/17/2019 8:45:16 AM By: Tobi Bastos MD, MBA Entered By: Tobi Bastos on 10/17/2019 08:45:15

## 2019-10-18 NOTE — Progress Notes (Signed)
Patient: Amy [name]  Service Category: E/M  Provider: Gillis Santa, MD  DOB: 02-08-1936  DOS: 10/18/2019  Location: Office  MRN: [id number]  Setting: Ambulatory outpatient  Referring Provider: [name], FNP  Type: Established Patient  Specialty: Interventional Pain Management  PCP: Towanda Malkin, MD  Location: Home  Delivery: TeleHealth     Virtual Encounter - Pain Management PROVIDER NOTE: Information contained herein reflects review and annotations entered in association with encounter. Interpretation of such information and data should be left to medically-trained personnel. Information provided to patient can be located elsewhere in the medical record under "Patient Instructions". Document created using STT-dictation technology, any transcriptional errors that may result from process are unintentional.    Contact & Pharmacy Preferred: [phone] Home: [phone] (home) Mobile: [phone] (mobile) E-mail: [email or web address]  [facility] - [place], [place] [address] Phone: [phone] Fax: [phone]  [facility] - [place], [place] [address] Phone: [phone] Fax: [phone]   Pre-screening  Amy [name] offered "in-person" vs "virtual" encounter. She indicated preferring virtual for this encounter.   Reason COVID-19*  Social distancing based on CDC and AMA recommendations.   I contacted Amy [name] on 10/18/2019 via video conference.      I clearly identified myself as Gillis Santa, MD. I verified that I was speaking with the correct person using two identifiers (Name: Amy [name], and date of birth: 11/11/1935).  Consent I sought verbal advanced consent from Amy [name] for virtual visit interactions. I informed Amy [name] of possible security and privacy concerns, risks, and limitations  associated with providing "not-in-person" medical evaluation and management services. I also informed Amy [name] of the availability of "in-person" appointments. Finally, I informed her that there would be a charge for the virtual visit and that she could be  personally, fully or partially, financially responsible for it. Amy [name] expressed understanding and agreed to proceed.   Historic Elements   Amy [name] is a 84 y.o. year old, female patient evaluated today after her last contact with our practice on 07/24/2019. Amy [name]  has a past medical history of Anemia, Anxiety, Arthritis, Bilateral cataracts, Bronchitis, Cataract, Chronic kidney disease, Depression, Diabetes mellitus without complication ([place]), DVT (deep venous thrombosis) ([place]), Emphysema of lung ([place]), GERD (gastroesophageal reflux disease), Headache, History of frequent urinary tract infections, renal cell cancer, Hyperlipidemia, Hypertension, Numbness and tingling, Obesity, Osteopenia (10/05/2017), Peripheral artery disease ([place]), Urinary frequency, and Urinary incontinence. She also  has a past surgical history that includes Leg amputation; Abdominal hysterectomy (1978); Cholecystectomy; Carpal tunnel release (Bilateral); stent placement right leg ; Robot assisted laparoscopic nephrectomy (Left, 01/15/2015); Colonoscopy; Colonoscopy with esophagogastroduodenoscopy (egd); ERCP (N/A, 08/19/2017); and Esophagogastroduodenoscopy (egd) with propofol (N/A, 10/12/2018). Amy [name] has a current medication list which includes the following prescription(s): accu-chek softclix lancets, acetaminophen, albuterol, atorvastatin, clopidogrel, cyanocobalamin, donepezil, famotidine, ferrous sulfate, fluoxetine, gabapentin, [START ON 10/27/2019] hydrocodone-acetaminophen, [START ON 11/26/2019] hydrocodone-acetaminophen, [START ON 12/26/2019] hydrocodone-acetaminophen, ipratropium-albuterol, pantoprazole, quetiapine, quetiapine, and vitamin b-1. She   reports that she quit smoking about 15 years ago. Her smoking use included cigarettes. She has a 76.50 pack-year smoking history. She has never used smokeless tobacco. She reports previous alcohol use. She reports that she does not use drugs. Amy [name] is allergic to fentanyl and penicillins.   HPI  Today, she is being contacted for medication management.    Patient's  pain is at baseline.  Patient continues multimodal pain regimen as prescribed.  States that it provides pain relief and improvement in functional status.  Pharmacotherapy Assessment  Analgesic:  09/27/2019  1   07/24/2019  Hydrocodone-Acetamin 7.5-325  90.00  30 Bi Lat   [id number]   Thr (4878)   0  22.50 MME  Medicare   [place]   Monitoring: [place] PMP: PDMP reviewed during this encounter.       Pharmacotherapy: No side-effects or adverse reactions reported. Compliance: No problems identified. Effectiveness: Clinically acceptable. Plan: Refer to "POC".  UDS: No results found for: SUMMARY Laboratory Chemistry Profile   Renal Lab Results  Component Value Date   BUN 22 08/08/2019   CREATININE 1.67 (H) 08/08/2019   LABCREA 111 02/03/2018   BCR 16 06/25/2019   GFRAA 33 (L) 08/08/2019   GFRNONAA 28 (L) 08/08/2019     Hepatic Lab Results  Component Value Date   AST 14 06/25/2019   ALT 10 06/25/2019   ALBUMIN 3.6 03/18/2019   ALKPHOS 159 (H) 03/18/2019   LIPASE 35 03/18/2019     Electrolytes Lab Results  Component Value Date   NA 140 08/08/2019   K 5.1 08/08/2019   CL 106 08/08/2019   CALCIUM 8.4 (L) 08/08/2019   MG 1.9 07/02/2018     Bone Lab Results  Component Value Date   VD25OH 17 (L) 02/03/2018     Inflammation (CRP: Acute Phase) (ESR: Chronic Phase) Lab Results  Component Value Date   ESRSEDRATE 19 01/27/2017       Note: Above Lab results reviewed.  Imaging  DG Hip Unilat W or Wo Pelvis 2-3 Views Left CLINICAL DATA:  Pain  EXAM: DG HIP (WITH OR WITHOUT PELVIS) 2-3V LEFT  COMPARISON:   None.  FINDINGS: Frontal pelvis as well as frontal and lateral left hip images were obtained.  : Bones are osteoporotic. No fracture or dislocation. There is moderate symmetric narrowing of each hip joint. There are stents in each common and external iliac artery as well as in the right common femoral artery. There are surgical clips in the left inguinal region.  IMPRESSION: Bones osteoporotic. Symmetric narrowing each hip joint. No fracture or dislocation.  Electronically Signed   By: Lowella Grip III M.D.   On: 08/08/2019 13:49  Assessment  The primary encounter diagnosis was S/P AKA (above knee amputation) unilateral, left ([place]). Diagnoses of Phantom limb syndrome with pain ([place]), Chronic pain syndrome, S/p nephrectomy, and Continuous opioid dependence ([place]) were also pertinent to this visit.  Plan of Care  Amy [name] has a current medication list which includes the following long-term medication(s): atorvastatin, donepezil, famotidine, fluoxetine, gabapentin, ipratropium-albuterol, and pantoprazole.  Pharmacotherapy (Medications Ordered): Meds ordered this encounter  Medications  . HYDROcodone-acetaminophen (NORCO) 7.5-325 MG tablet    Sig: Take 1 tablet by mouth every 8 (eight) hours as needed for moderate pain.    Dispense:  90 tablet    Refill:  0  . HYDROcodone-acetaminophen (NORCO) 7.5-325 MG tablet    Sig: Take 1 tablet by mouth every 8 (eight) hours as needed for moderate pain.    Dispense:  90 tablet    Refill:  0  . HYDROcodone-acetaminophen (NORCO) 7.5-325 MG tablet    Sig: Take 1 tablet by mouth every 8 (eight) hours as needed for moderate pain.    Dispense:  90 tablet    Refill:  0   Follow-up plan:   Return in about 3 months (around 01/18/2020)  for Medication Management, in person.    Recent Visits Date Type Provider Dept  07/24/19 Office Visit Gillis Santa, MD [facility]  Showing recent visits within past 90 days and  meeting all other requirements   Today's Visits Date Type Provider Dept  10/18/19 Telemedicine Gillis Santa, MD [facility]  Showing today's visits and meeting all other requirements   Future Appointments No visits were found meeting these conditions.  Showing future appointments within next 90 days and meeting all other requirements   I discussed the assessment and treatment plan with the patient. The patient was provided an opportunity to ask questions and all were answered. The patient agreed with the plan and demonstrated an understanding of the instructions.  Patient advised to call back or seek an in-person evaluation if the symptoms or condition worsens.  Duration of encounter: 30 minutes.  Note by: Gillis Santa, MD Date: 10/18/2019; Time: 9:43 AM

## 2019-10-19 ENCOUNTER — Telehealth: Payer: Self-pay | Admitting: *Deleted

## 2019-10-23 ENCOUNTER — Other Ambulatory Visit: Payer: Self-pay

## 2019-10-23 ENCOUNTER — Other Ambulatory Visit: Payer: Medicare HMO | Admitting: Primary Care

## 2019-10-23 DIAGNOSIS — Z515 Encounter for palliative care: Secondary | ICD-10-CM

## 2019-10-23 DIAGNOSIS — J441 Chronic obstructive pulmonary disease with (acute) exacerbation: Secondary | ICD-10-CM | POA: Diagnosis not present

## 2019-10-23 NOTE — Progress Notes (Signed)
[facility] Consult Note Telephone: [phone]  Fax: [phone]  PATIENT NAME: Amy [name] [address] [place] [address] [phone] (home)  DOB: Jan 08, 1936 MRN: [id number]  PRIMARY CARE PROVIDER:    Towanda Malkin, MD,  [address] [place] [address] [phone]  REFERRING PROVIDER:   Hubbard [name], [place] Amy [name],  [place] [address] [phone]  RESPONSIBLE PARTY:   Extended Emergency Contact Information Primary Emergency Contact: Amy [name] [employer] Address: [address]          [place], [place] [address] Amy [name] of [place] Phone: [phone] Mobile Phone: [phone] Relation: Spouse Secondary Emergency Contact: Amy [name] Phone: [phone] Mobile Phone: [phone] Relation: Son    I met with patient   in the home.  ASSESSMENT AND RECOMMENDATIONS:   1. Advance Care Planning/Goals of Care: Goals include to maximize quality of life and symptom management. I met with Amy [name] and her home with her caregiver. She looked very well today, relaxed and comfortable. She has advance directives on file.  2. Symptom Management:   Caregiver strain: She has been able to obtain an excellent caregiver who comes to the home three days a week. The caregiver is knowledgeable and Amy [name] likes her. Mr. Zufall was working in the garden and has very much benefited from assistance with caregiving strain.   Nutrition: We discussed her intake and that has been good. On occasion she does have some early satiety but with encouragement will keep eating.   Skin: She does have some skin breakdown but this is responding well to topical zinc. She has some bouts with loose stools and incontinence which does keep her skin at risk to break down.   Pain: She states she does have some phantom pain  in her left leg, which is amputated. She currently takes 300 mg of  gabapentin three times a day and may benefit from increasing this if her phantom pain significantly impairs her quality-of-life. She is seen by pain management who handles her narcotics.   Agitation: Behaviors have been fairly stable she still has some nocturnal agitation but this thing better managed now.  3. Family /Caregiver/Community Supports: lives with elderly husband in own home, has paid caregiver 3 days a week   4. Cognitive / Functional decline: A and O x 2. 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 Caregiver states sometimes she feels she needs to go to work. W/c bound, needs help with adls.  5. Follow up Palliative Care Visit: Palliative care will continue to follow for goals of care clarification and symptom management. Return 8 weeks or prn.  I spent 40 minutes providing this consultation,  from 1130 to 1210. More than 50% of the time in this consultation was spent coordinating communication.   HISTORY OF PRESENT ILLNESS:  Amy [name] is a 84 y.o. year old female with multiple medical problems including dementia, chronic pain. Palliative Care was asked to follow this patient by consultation request of Amy [name] to help address advance care planning and goals of care. This is a follow up visit.  CODE STATUS: , DNR, limited intervention, use of abx and iv, no feeding tube.  PPS: 30% HOSPICE ELIGIBILITY/DIAGNOSIS: no  PAST MEDICAL HISTORY:  Past Medical History:  Diagnosis Date  . Anemia    low iron  . Anxiety   . Arthritis   . Bilateral cataracts   . Bronchitis    hx  of   . Cataract   . Chronic kidney disease   . Depression   . Diabetes mellitus without complication ([place])   . 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 Emphysema of lung ([place])   . GERD (gastroesophageal reflux disease)   . Headache   . History of frequent urinary tract infections   . Hx of renal cell cancer    LEFT  . Hyperlipidemia   .  Hypertension   . Numbness and tingling    right hand   . Obesity   . Osteopenia 10/05/2017  . Peripheral artery disease ([place])   . Urinary frequency   . Urinary incontinence     SOCIAL HX:  Social History   Tobacco Use  . Smoking status: Former Smoker    Packs/day: 1.50    Years: 51.00    Pack years: 76.50    Types: Cigarettes    Quit date: 05/24/2004    Years since quitting: 15.4  . Smokeless tobacco: Never Used  . Tobacco comment: smoking cessation materials not required  Substance Use Topics  . Alcohol use: Not Currently    ALLERGIES:  Allergies  Allergen Reactions  . Fentanyl Other (See Comments)    urine retention  . Penicillins Itching, Rash and Other (See Comments)    Has patient had a PCN reaction causing immediate rash, facial/tongue/throat swelling, SOB or lightheadedness with hypotension: Yes Has patient had a PCN reaction causing severe rash involving mucus membranes or skin necrosis: No Has patient had a PCN reaction that required hospitalization: No Has patient had a PCN reaction occurring within the last 10 years: Yes If all of the above answers are "NO", then may proceed with Cephalosporin use.     PERTINENT MEDICATIONS:  Outpatient Encounter Medications as of 82/05/2019  Medication Sig  . Accu-Chek Softclix Lancets lancets Use as instructed  . acetaminophen (TYLENOL) 325 MG tablet Take 325 mg by mouth every 6 (six) hours as needed.  [name] albuterol (VENTOLIN HFA) 108 (90 Base) MCG/ACT inhaler Inhale 1-2 puffs into the lungs every 4 (four) hours as needed.   [name] atorvastatin (LIPITOR) 20 MG tablet Take 1 tablet (20 mg total) by mouth daily at 6 PM.  . clopidogrel (PLAVIX) 75 MG tablet TAKE 1 TABLET EVERY DAY  . Cyanocobalamin (VITAMIN B12 PO) Take by mouth.  . donepezil (ARICEPT) 10 MG tablet Take 1 tablet (10 mg total) by mouth at bedtime.  . famotidine (PEPCID) 20 MG tablet Take 1 tablet (20 mg total) by mouth daily.  . ferrous sulfate 325 (65 FE) MG tablet Take  325 mg by mouth daily with breakfast. Pt taking every other day  . FLUoxetine (PROZAC) 10 MG capsule Take 1 capsule (10 mg total) by mouth daily.  [name] gabapentin (NEURONTIN) 300 MG capsule Take 1 capsule (300 mg total) by mouth 3 (three) times daily.  [name] ON 10/27/2019] HYDROcodone-acetaminophen (NORCO) 7.5-325 MG tablet Take 1 tablet by mouth every 8 (eight) hours as needed for moderate pain.  [name] ON 11/26/2019] HYDROcodone-acetaminophen (NORCO) 7.5-325 MG tablet Take 1 tablet by mouth every 8 (eight) hours as needed for moderate pain.  [name] ON 12/26/2019] HYDROcodone-acetaminophen (NORCO) 7.5-325 MG tablet Take 1 tablet by mouth every 8 (eight) hours as needed for moderate pain.  [name] ipratropium-albuterol (DUONEB) 0.5-2.5 (3) MG/3ML SOLN INHALE THE CONTENTS OF 1 VIAL VIA NEBULIZER THREE TIMES DAILY AS DIRECTED  . pantoprazole (PROTONIX)  40 MG tablet Take 1 tablet (40 mg total) by mouth daily.  . QUEtiapine (SEROQUEL) 25 MG tablet Take 25 mg by mouth 2 (two) times daily. Takes at 2 pm and Total 75 mg a hs.  . QUEtiapine (SEROQUEL) 50 MG tablet Take 50 mg by mouth at bedtime.  . Thiamine HCl (VITAMIN B-1) 250 MG tablet Take 250 mg by mouth daily.   No facility-administered encounter medications on file as of 82/05/2019.    PHYSICAL EXAM / ROS:   Current and past weights: Unavailable, appears stable General: NAD, frail appearing, obese Cardiovascular: no chest pain reported, no edema  Pulmonary: no cough, no increased SOB, cont supplemental oxygen  Abdomen: appetite good, early satiety, denies constipation, incontinent of bowel GU: denies dysuria, incontinent of urine MSK:  + joint and ROM abnormalities, L AKA , non ambulatory Skin:  [place] skin breakdown Neurological: Weakness, sleeping better at hs.  Jason Coop, NP [facility]  COVID-19 PATIENT SCREENING TOOL  Person answering questions: ____________caregiver_______ _____   1.  Is the patient or any family member in the home  showing any signs or symptoms regarding respiratory infection?               Person with Symptom- __________NA_________________  a. Fever                                                                          Yes___ No___          ___________________  b. Shortness of breath                                                    Yes___ No___          ___________________ c. Cough/congestion                                       Yes___  No___         ___________________ d. Body aches/pains                                                         Yes___ No___        ____________________ e. Gastrointestinal symptoms (diarrhea, nausea)           Yes___ No___        ____________________  2. Within the past 14 days, has anyone living in the home had any contact with someone with or under investigation for COVID-19?    Yes___ No_X_   Person __________________

## 2019-10-25 ENCOUNTER — Ambulatory Visit (INDEPENDENT_AMBULATORY_CARE_PROVIDER_SITE_OTHER): Payer: Medicare HMO | Admitting: *Deleted

## 2019-10-25 DIAGNOSIS — F0281 Dementia in other diseases classified elsewhere with behavioral disturbance: Secondary | ICD-10-CM

## 2019-10-25 DIAGNOSIS — I10 Essential (primary) hypertension: Secondary | ICD-10-CM | POA: Diagnosis not present

## 2019-10-25 DIAGNOSIS — G301 Alzheimer's disease with late onset: Secondary | ICD-10-CM

## 2019-10-25 NOTE — Patient Instructions (Addendum)
Thank you allowing the Chronic Care Management Team to be a part of your care! It was a pleasure speaking with you today!  1. Please follow up with this social worker regarding the court hearing on Tuesday for POA of patient's finances  CCM (Chronic Care Management) Team   Neldon Labella RN, BSN Nurse Care Coordinator  [phone]  [place], LCSW Clinical Social Worker [phone]  Goals Addressed            This Visit's Progress   . Per patient's spouse "I think I need to look in to long term care for my wife" (pt-stated)       CARE PLAN ENTRY (see longitudinal plan of care for additional care plan information)  Current Barriers:  . Level of care concerns  Clinical Social Work Clinical Goal(s):  [name] Over the next 90 days, patient's spouse will work with SW to address concerns related to patient's level of care needs  Interventions:  . Collaboration phone call to [facility] to explore bed options. All listed facilities have bed availabilities and would like FL2 and supporting documents faxed . Phone call to patient's spouse, potential bed offers discussed and the requirement that he will have to complete spend down process and privately pay until patient is eligible for Medicaid . Patient's spouse agreed to private pay and states that he has a scheduled court hearing on 10/30/19 to obtain POA of patient's finances. . This social worker explained that the [facility] and supporting documents will be faxed to the above facilities and patient's spouse will be contacted with official bed offers . Supportive counseling and positive reinforcement provided to patient's spouse related to decision made for patient to transition to facility care  Patient Self Care Activities:  . Attends all scheduled provider appointments . Unable to perform ADLs independently . Unable to perform IADLs independently . Knowledge deficit of the out of home  placement process  Please see past updates related to this goal by clicking on the "Past Updates" button in the selected goal          The patient verbalized understanding of instructions provided today and declined a print copy of patient instruction materials.   Telephone follow up appointment with care management team member scheduled for:  11/01/19

## 2019-10-25 NOTE — Chronic Care Management (AMB) (Signed)
Chronic Care Management    Clinical Social Work Follow Up Note  10/25/2019 Name: Amy [name] MRN: [id number] DOB: 10/21/1935  Amy [name] is a 84 y.o. year old female who is a primary care patient of Towanda Malkin, MD. The CCM team was consulted for assistance with Level of Care Concerns.   Review of patient status, including review of consultants reports, other relevant assessments, and collaboration with appropriate care team members and the patient's provider was performed as part of comprehensive patient evaluation and provision of chronic care management services.    SDOH (Social Determinants of Health) assessments performed: No    Outpatient Encounter Medications as of 10/25/2019  Medication Sig  . Accu-Chek Softclix Lancets lancets Use as instructed  . acetaminophen (TYLENOL) 325 MG tablet Take 325 mg by mouth every 6 (six) hours as needed.  [name] albuterol (VENTOLIN HFA) 108 (90 Base) MCG/ACT inhaler Inhale 1-2 puffs into the lungs every 4 (four) hours as needed.   [name] atorvastatin (LIPITOR) 20 MG tablet Take 1 tablet (20 mg total) by mouth daily at 6 PM.  . clopidogrel (PLAVIX) 75 MG tablet TAKE 1 TABLET EVERY DAY  . Cyanocobalamin (VITAMIN B12 PO) Take by mouth.  . donepezil (ARICEPT) 10 MG tablet Take 1 tablet (10 mg total) by mouth at bedtime.  . famotidine (PEPCID) 20 MG tablet Take 1 tablet (20 mg total) by mouth daily.  . ferrous sulfate 325 (65 FE) MG tablet Take 325 mg by mouth daily with breakfast. Pt taking every other day  . FLUoxetine (PROZAC) 10 MG capsule Take 1 capsule (10 mg total) by mouth daily.  [name] gabapentin (NEURONTIN) 300 MG capsule Take 1 capsule (300 mg total) by mouth 3 (three) times daily.  [name] ON 10/27/2019] HYDROcodone-acetaminophen (NORCO) 7.5-325 MG tablet Take 1 tablet by mouth every 8 (eight) hours as needed for moderate pain.  [name] ON 11/26/2019] HYDROcodone-acetaminophen (NORCO) 7.5-325 MG tablet Take 1 tablet by mouth every 8  (eight) hours as needed for moderate pain.  [name] ON 12/26/2019] HYDROcodone-acetaminophen (NORCO) 7.5-325 MG tablet Take 1 tablet by mouth every 8 (eight) hours as needed for moderate pain.  [name] ipratropium-albuterol (DUONEB) 0.5-2.5 (3) MG/3ML SOLN INHALE THE CONTENTS OF 1 VIAL VIA NEBULIZER THREE TIMES DAILY AS DIRECTED  . pantoprazole (PROTONIX) 40 MG tablet Take 1 tablet (40 mg total) by mouth daily.  . QUEtiapine (SEROQUEL) 25 MG tablet Take 25 mg by mouth 2 (two) times daily. Takes at 2 pm and Total 75 mg a hs.  . QUEtiapine (SEROQUEL) 50 MG tablet Take 50 mg by mouth at bedtime.  . Thiamine HCl (VITAMIN B-1) 250 MG tablet Take 250 mg by mouth daily.   No facility-administered encounter medications on file as of 10/25/2019.     Goals Addressed            This Visit's Progress   . Per patient's spouse "I think I need to look in to long term care for my wife" (pt-stated)       CARE PLAN ENTRY (see longitudinal plan of care for additional care plan information)  Current Barriers:  . Level of care concerns  Clinical Social Work Clinical Goal(s):  [name] Over the next 90 days, patient's spouse will work with SW to address concerns related to patient's level of care needs  Interventions:  . Collaboration phone call to [facility] to explore bed options. All listed facilities have bed availabilities and  would like FL2 and supporting documents faxed . Phone call to patient's spouse, potential bed offers discussed and the requirement that he will have to complete spend down process and privately pay until patient is eligible for Medicaid . Patient's spouse agreed to private pay and states that he has a scheduled court hearing on 10/30/19 to obtain POA of patient's finances. . This social worker explained that the [facility] and supporting documents will be faxed to the above facilities and patient's spouse will be contacted with official bed  offers . Supportive counseling and positive reinforcement provided to patient's spouse related to decision made for patient to transition to facility care  Patient Self Care Activities:  . Attends all scheduled provider appointments . Unable to perform ADLs independently . Unable to perform IADLs independently . Knowledge deficit of the out of home placement process  Please see past updates related to this goal by clicking on the "Past Updates" button in the selected goal          Follow Up Plan: SW will follow up with patient by phone over the next 7-10 business days   Elliot Gurney, [place] Worker  [facility] Management [phone]

## 2019-10-29 ENCOUNTER — Encounter: Payer: Self-pay | Admitting: *Deleted

## 2019-10-29 ENCOUNTER — Ambulatory Visit: Payer: Self-pay | Admitting: *Deleted

## 2019-10-29 DIAGNOSIS — I10 Essential (primary) hypertension: Secondary | ICD-10-CM

## 2019-10-29 DIAGNOSIS — F02818 Dementia in other diseases classified elsewhere, unspecified severity, with other behavioral disturbance: Secondary | ICD-10-CM

## 2019-10-29 NOTE — Chronic Care Management (AMB) (Signed)
Chronic Care Management    Clinical Social Work Follow Up Note  10/29/2019 Name: Amy [name] MRN: [id number] DOB: [birth year]  Amy [name] is a 84 y.o. year old female who is a primary care patient of Towanda Malkin, MD. The CCM team was consulted for assistance with Level of Care Concerns.   Review of patient status, including review of consultants reports, other relevant assessments, and collaboration with appropriate care team members and the patient's provider was performed as part of comprehensive patient evaluation and provision of chronic care management services.    SDOH (Social Determinants of Health) assessments performed: No    Outpatient Encounter Medications as of 10/29/2019  Medication Sig  . Accu-Chek Softclix Lancets lancets Use as instructed  . acetaminophen (TYLENOL) 325 MG tablet Take 325 mg by mouth every 6 (six) hours as needed.  [name] albuterol (VENTOLIN HFA) 108 (90 Base) MCG/ACT inhaler Inhale 1-2 puffs into the lungs every 4 (four) hours as needed.   [name] atorvastatin (LIPITOR) 20 MG tablet Take 1 tablet (20 mg total) by mouth daily at 6 PM.  . clopidogrel (PLAVIX) 75 MG tablet TAKE 1 TABLET EVERY DAY  . Cyanocobalamin (VITAMIN B12 PO) Take by mouth.  . donepezil (ARICEPT) 10 MG tablet Take 1 tablet (10 mg total) by mouth at bedtime.  . famotidine (PEPCID) 20 MG tablet Take 1 tablet (20 mg total) by mouth daily.  . ferrous sulfate 325 (65 FE) MG tablet Take 325 mg by mouth daily with breakfast. Pt taking every other day  . FLUoxetine (PROZAC) 10 MG capsule Take 1 capsule (10 mg total) by mouth daily.  [name] gabapentin (NEURONTIN) 300 MG capsule Take 1 capsule (300 mg total) by mouth 3 (three) times daily.  [name] HYDROcodone-acetaminophen (NORCO) 7.5-325 MG tablet Take 1 tablet by mouth every 8 (eight) hours as needed for moderate pain.  [name] ON 11/26/2019] HYDROcodone-acetaminophen (NORCO) 7.5-325 MG tablet Take 1 tablet by mouth every 8 (eight) hours as  needed for moderate pain.  [name] ON 12/26/2019] HYDROcodone-acetaminophen (NORCO) 7.5-325 MG tablet Take 1 tablet by mouth every 8 (eight) hours as needed for moderate pain.  [name] ipratropium-albuterol (DUONEB) 0.5-2.5 (3) MG/3ML SOLN INHALE THE CONTENTS OF 1 VIAL VIA NEBULIZER THREE TIMES DAILY AS DIRECTED  . pantoprazole (PROTONIX) 40 MG tablet Take 1 tablet (40 mg total) by mouth daily.  . QUEtiapine (SEROQUEL) 25 MG tablet Take 25 mg by mouth 2 (two) times daily. Takes at 2 pm and Total 75 mg a hs.  . QUEtiapine (SEROQUEL) 50 MG tablet Take 50 mg by mouth at bedtime.  . Thiamine HCl (VITAMIN B-1) 250 MG tablet Take 250 mg by mouth daily.   No facility-administered encounter medications on file as of 10/29/2019.     Goals Addressed            This Visit's Progress   . Per patient's spouse "I think I need to look in to long term care for my wife" (pt-stated)       CARE PLAN ENTRY (see longitudinal plan of care for additional care plan information)  Current Barriers:  . Level of care concerns  Clinical Social Work Clinical Goal(s):  [name] Over the next 90 days, patient's spouse will work with SW to address concerns related to patient's level of care needs  Interventions:  . Collaboration phone call to [facility] to check status on bed offers   All listed facilities have bed availabilities,  clinical information still in review-will call this social worker back with update on bed offers Patient Self Care Activities:  . Attends all scheduled provider appointments . Unable to perform ADLs independently . Unable to perform IADLs independently . Knowledge deficit of the out of home placement process  Please see past updates related to this goal by clicking on the "Past Updates" button in the selected goal          Follow Up Plan: SW will follow up with patient's spouse by phone over the next 7-10 business days regarding long term care  options   [place] Krist, [place] Worker  [facility] [phone]

## 2019-10-29 NOTE — Progress Notes (Signed)
This encounter was created in error - please disregard.

## 2019-10-30 DIAGNOSIS — R809 Proteinuria, unspecified: Secondary | ICD-10-CM | POA: Diagnosis not present

## 2019-10-30 DIAGNOSIS — N2581 Secondary hyperparathyroidism of renal origin: Secondary | ICD-10-CM | POA: Diagnosis not present

## 2019-10-30 DIAGNOSIS — E1122 Type 2 diabetes mellitus with diabetic chronic kidney disease: Secondary | ICD-10-CM | POA: Diagnosis not present

## 2019-10-30 DIAGNOSIS — D631 Anemia in chronic kidney disease: Secondary | ICD-10-CM | POA: Diagnosis not present

## 2019-10-30 DIAGNOSIS — N1832 Chronic kidney disease, stage 3b: Secondary | ICD-10-CM | POA: Diagnosis not present

## 2019-10-30 DIAGNOSIS — I129 Hypertensive chronic kidney disease with stage 1 through stage 4 chronic kidney disease, or unspecified chronic kidney disease: Secondary | ICD-10-CM | POA: Diagnosis not present

## 2019-10-31 DIAGNOSIS — E1122 Type 2 diabetes mellitus with diabetic chronic kidney disease: Secondary | ICD-10-CM | POA: Diagnosis not present

## 2019-10-31 DIAGNOSIS — I129 Hypertensive chronic kidney disease with stage 1 through stage 4 chronic kidney disease, or unspecified chronic kidney disease: Secondary | ICD-10-CM | POA: Diagnosis not present

## 2019-10-31 DIAGNOSIS — D631 Anemia in chronic kidney disease: Secondary | ICD-10-CM | POA: Diagnosis not present

## 2019-10-31 DIAGNOSIS — N2581 Secondary hyperparathyroidism of renal origin: Secondary | ICD-10-CM | POA: Diagnosis not present

## 2019-10-31 DIAGNOSIS — R809 Proteinuria, unspecified: Secondary | ICD-10-CM | POA: Diagnosis not present

## 2019-10-31 DIAGNOSIS — J449 Chronic obstructive pulmonary disease, unspecified: Secondary | ICD-10-CM | POA: Diagnosis not present

## 2019-10-31 DIAGNOSIS — N1832 Chronic kidney disease, stage 3b: Secondary | ICD-10-CM | POA: Diagnosis not present

## 2019-11-01 ENCOUNTER — Encounter: Payer: Self-pay | Admitting: Urology

## 2019-11-01 ENCOUNTER — Ambulatory Visit (INDEPENDENT_AMBULATORY_CARE_PROVIDER_SITE_OTHER): Payer: Medicare HMO | Admitting: Urology

## 2019-11-01 ENCOUNTER — Telehealth: Payer: Self-pay

## 2019-11-01 ENCOUNTER — Other Ambulatory Visit: Payer: Self-pay

## 2019-11-01 VITALS — BP 135/70 | HR 85 | Ht 62.0 in | Wt 154.0 lb

## 2019-11-01 DIAGNOSIS — R300 Dysuria: Secondary | ICD-10-CM | POA: Diagnosis not present

## 2019-11-01 DIAGNOSIS — Z85528 Personal history of other malignant neoplasm of kidney: Secondary | ICD-10-CM | POA: Diagnosis not present

## 2019-11-01 NOTE — Progress Notes (Signed)
11/01/2019 4:14 PM   Amy [name] Nov 12, 1935 [id number]  Referring provider: N/A  Chief Complaint  Patient presents with  . Urinary Retention    HPI: 84 y.o. female presents for annual follow-up  -Seen last year for an episode of urinary retention which occurred after a procedure under sedation -Denies recurrent retention -Complains of dysuria, husband states today she had a specimen checked yesterday in Dr. Assunta Gambles office -Denies gross hematuria, flank or abdominal pain  -Status post left nephrectomy Dr. 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 Mike Gip 06/2019 for follow-up of gastric carcinoma and renal cell carcinoma -PET/CT 02/2019 with small pulmonary nodules, small mediastinal nodes and lytic lesion left eighth rib felt to be secondary to metastatic renal cell carcinoma -Underwent palliative radiation to rib -A chest CT was ordered and she was to be seen in follow-up however her husband states this was never scheduled and she has not been seen by oncology since February 2021   PMH: Past Medical History:  Diagnosis Date  . Anemia    low iron  . Anxiety   . Arthritis   . Bilateral cataracts   . Bronchitis    hx of   . Cataract   . Chronic kidney disease   . Depression   . Diabetes mellitus without complication ([place])   . 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 Emphysema of lung ([place])   . GERD (gastroesophageal reflux disease)   . Headache   . History of frequent urinary tract infections   . Hx of renal cell cancer    LEFT  . Hyperlipidemia   . Hypertension   . Numbness and tingling    right hand   . Obesity   . Osteopenia 10/05/2017  . Peripheral artery disease ([place])   . Urinary frequency   . Urinary incontinence     Surgical History: Past Surgical History:  Procedure Laterality Date  . ABDOMINAL HYSTERECTOMY  1978  . CARPAL TUNNEL  RELEASE Bilateral   . CHOLECYSTECTOMY    . COLONOSCOPY    . COLONOSCOPY WITH ESOPHAGOGASTRODUODENOSCOPY (EGD)    . ERCP N/A 08/19/2017   Procedure: ENDOSCOPIC RETROGRADE CHOLANGIOPANCREATOGRAPHY (ERCP);  Surgeon: Arta Silence, MD;  [facility]COPY;  Service: Endoscopy;  Laterality: N/A;  . ESOPHAGOGASTRODUODENOSCOPY (EGD) WITH PROPOFOL N/A 10/12/2018   Procedure: ESOPHAGOGASTRODUODENOSCOPY (EGD) WITH PROPOFOL;  Surgeon: Virgel Manifold, MD;  Location: [facility] ENDOSCOPY;  Service: Endoscopy;  Laterality: N/A;  . LEG AMPUTATION     above the knee / left   . ROBOT ASSISTED LAPAROSCOPIC NEPHRECTOMY Left 01/15/2015   Procedure: ROBOTIC ASSISTED LAPAROSCOPIC RADICAL NEPHRECTOMY;  Surgeon: Alexis Frock, MD;  Location: [facility] ORS;  Service: Urology;  Laterality: Left;  . stent placement right leg       Home Medications:  Allergies as of 11/01/2019      Reactions   Fentanyl Other (See Comments)   urine retention   Penicillins Itching, Rash, Other (See Comments)   Has patient had a PCN reaction causing immediate rash, facial/tongue/throat swelling, SOB or lightheadedness with hypotension: Yes Has patient had a PCN reaction causing severe rash involving mucus membranes or skin necrosis: No Has patient had a PCN reaction that required hospitalization: No Has patient had a PCN reaction occurring within the last 10 years: Yes If all of the above answers are "NO", then may proceed with  Cephalosporin use.      Medication List       Accurate as of November 01, 2019  4:14 PM. If you have any questions, ask your nurse or doctor.        Accu-Chek Softclix Lancets lancets Use as instructed   acetaminophen 325 MG tablet Commonly known as: TYLENOL Take 325 mg by mouth every 6 (six) hours as needed.   atorvastatin 20 MG tablet Commonly known as: LIPITOR Take 1 tablet (20 mg total) by mouth daily at 6 PM.   clopidogrel 75 MG tablet Commonly known as: PLAVIX TAKE 1 TABLET EVERY DAY     donepezil 10 MG tablet Commonly known as: ARICEPT Take 1 tablet (10 mg total) by mouth at bedtime.   famotidine 20 MG tablet Commonly known as: PEPCID Take 1 tablet (20 mg total) by mouth daily.   ferrous sulfate 325 (65 FE) MG tablet Take 325 mg by mouth daily with breakfast. Pt taking every other day   FLUoxetine 10 MG capsule Commonly known as: PROZAC Take 1 capsule (10 mg total) by mouth daily.   gabapentin 300 MG capsule Commonly known as: NEURONTIN Take 1 capsule (300 mg total) by mouth 3 (three) times daily.   HYDROcodone-acetaminophen 7.5-325 MG tablet Commonly known as: NORCO Take 1 tablet by mouth every 8 (eight) hours as needed for moderate pain.   HYDROcodone-acetaminophen 7.5-325 MG tablet Commonly known as: NORCO Take 1 tablet by mouth every 8 (eight) hours as needed for moderate pain. Start taking on: November 26, 2019   HYDROcodone-acetaminophen 7.5-325 MG tablet Commonly known as: NORCO Take 1 tablet by mouth every 8 (eight) hours as needed for moderate pain. Start taking on: December 26, 2019   ipratropium-albuterol 0.5-2.5 (3) MG/3ML Soln Commonly known as: DUONEB INHALE THE CONTENTS OF 1 VIAL VIA NEBULIZER THREE TIMES DAILY AS DIRECTED   pantoprazole 40 MG tablet Commonly known as: PROTONIX Take 1 tablet (40 mg total) by mouth daily.   QUEtiapine 50 MG tablet Commonly known as: SEROQUEL Take 50 mg by mouth at bedtime.   QUEtiapine 25 MG tablet Commonly known as: SEROQUEL Take 25 mg by mouth 2 (two) times daily. Takes at 2 pm and Total 75 mg a hs.   Ventolin HFA 108 (90 Base) MCG/ACT inhaler Generic drug: albuterol Inhale 1-2 puffs into the lungs every 4 (four) hours as needed.   vitamin B-1 250 MG tablet Take 250 mg by mouth daily.   VITAMIN B12 PO Take by mouth.       Allergies:  Allergies  Allergen Reactions  . Fentanyl Other (See Comments)    urine retention  . Penicillins Itching, Rash and Other (See Comments)    Has patient had a  PCN reaction causing immediate rash, facial/tongue/throat swelling, SOB or lightheadedness with hypotension: Yes Has patient had a PCN reaction causing severe rash involving mucus membranes or skin necrosis: No Has patient had a PCN reaction that required hospitalization: No Has patient had a PCN reaction occurring within the last 10 years: Yes If all of the above answers are "NO", then may proceed with Cephalosporin use.    Family History: Family History  Problem Relation Age of Onset  . Cancer Mother        Stomach  . Cancer Father   . Diabetes Brother   . Cancer Brother        oral  . Healthy Son   . Healthy Sister     Social History:  reports that she  quit smoking about 15 years ago. Her smoking use included cigarettes. She has a 76.50 pack-year smoking history. She has never used smokeless tobacco. She reports previous alcohol use. She reports that she does not use drugs.   Physical Exam: BP 135/70   Pulse 85   Ht 5\' 2"  (1.575 m)   Wt 154 lb (69.9 kg)   BMI 28.17 kg/m   Constitutional:  Alert and oriented, No acute distress. HEENT: [place] AT, moist mucus membranes.  Trachea midline, no masses. Cardiovascular: No clubbing, cyanosis, or edema. Respiratory: Normal respiratory effort, no increased work of breathing.   Assessment & Plan:    History urinary retention -Resolved -Labs were ordered by Dr. 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 Mike Gip regarding follow-up   Abbie Sons, MD  [facility] [address], [place], [place] [address] [phone]

## 2019-11-02 ENCOUNTER — Ambulatory Visit: Payer: Self-pay | Admitting: *Deleted

## 2019-11-02 ENCOUNTER — Telehealth: Payer: Self-pay | Admitting: *Deleted

## 2019-11-02 DIAGNOSIS — G301 Alzheimer's disease with late onset: Secondary | ICD-10-CM

## 2019-11-02 DIAGNOSIS — F02818 Dementia in other diseases classified elsewhere, unspecified severity, with other behavioral disturbance: Secondary | ICD-10-CM

## 2019-11-02 DIAGNOSIS — I10 Essential (primary) hypertension: Secondary | ICD-10-CM

## 2019-11-02 NOTE — Chronic Care Management (AMB) (Signed)
Chronic Care Management    Clinical Social Work Follow Up Note  11/02/2019 Name: Amy [name] MRN: [id number] DOB: [birth year]  Amy [name] is a 84 y.o. year old female who is a primary care patient of Towanda Malkin, MD. The CCM team was consulted for assistance with Level of Care Concerns.   Review of patient status, including review of consultants reports, other relevant assessments, and collaboration with appropriate care team members and the patient's provider was performed as part of comprehensive patient evaluation and provision of chronic care management services.    SDOH (Social Determinants of Health) assessments performed: No    Outpatient Encounter Medications as of 11/02/2019  Medication Sig  . Accu-Chek Softclix Lancets lancets Use as instructed  . acetaminophen (TYLENOL) 325 MG tablet Take 325 mg by mouth every 6 (six) hours as needed.  [name] albuterol (VENTOLIN HFA) 108 (90 Base) MCG/ACT inhaler Inhale 1-2 puffs into the lungs every 4 (four) hours as needed.   [name] atorvastatin (LIPITOR) 20 MG tablet Take 1 tablet (20 mg total) by mouth daily at 6 PM.  . clopidogrel (PLAVIX) 75 MG tablet TAKE 1 TABLET EVERY DAY  . Cyanocobalamin (VITAMIN B12 PO) Take by mouth.  . donepezil (ARICEPT) 10 MG tablet Take 1 tablet (10 mg total) by mouth at bedtime.  . famotidine (PEPCID) 20 MG tablet Take 1 tablet (20 mg total) by mouth daily.  . ferrous sulfate 325 (65 FE) MG tablet Take 325 mg by mouth daily with breakfast. Pt taking every other day  . FLUoxetine (PROZAC) 10 MG capsule Take 1 capsule (10 mg total) by mouth daily.  [name] gabapentin (NEURONTIN) 300 MG capsule Take 1 capsule (300 mg total) by mouth 3 (three) times daily.  [name] HYDROcodone-acetaminophen (NORCO) 7.5-325 MG tablet Take 1 tablet by mouth every 8 (eight) hours as needed for moderate pain.  [name] ON 11/26/2019] HYDROcodone-acetaminophen (NORCO) 7.5-325 MG tablet Take 1 tablet by mouth every 8 (eight) hours as  needed for moderate pain.  [name] ON 12/26/2019] HYDROcodone-acetaminophen (NORCO) 7.5-325 MG tablet Take 1 tablet by mouth every 8 (eight) hours as needed for moderate pain.  [name] ipratropium-albuterol (DUONEB) 0.5-2.5 (3) MG/3ML SOLN INHALE THE CONTENTS OF 1 VIAL VIA NEBULIZER THREE TIMES DAILY AS DIRECTED  . pantoprazole (PROTONIX) 40 MG tablet Take 1 tablet (40 mg total) by mouth daily.  . QUEtiapine (SEROQUEL) 25 MG tablet Take 25 mg by mouth 2 (two) times daily. Takes at 2 pm and Total 75 mg a hs.  . QUEtiapine (SEROQUEL) 50 MG tablet Take 50 mg by mouth at bedtime.  . Thiamine HCl (VITAMIN B-1) 250 MG tablet Take 250 mg by mouth daily.   No facility-administered encounter medications on file as of 11/02/2019.     Goals Addressed              This Visit's Progress   .  Per patient's spouse "I think I need to look in to long term care for my wife" (pt-stated)        CARE PLAN ENTRY (see longitudinal plan of care for additional care plan information)  Current Barriers:  . Level of care concerns  Clinical Social Work Clinical Goal(s):  [name] Over the next 90 days, patient's spouse will work with SW to address concerns related to patient's level of care needs  Interventions:  . Collaboration phone call to [place] [facility] for a follow up call . Collaboration phone call to [facility]-spoke with [facility]  bed available, will need need to pay out of pocket $260 semi private, $280.00 private-phone number provided for  patient's spouse to call regarding bed offer and financial responsibility . [facility] would need to do a home visit before making a bed offer . Patient' spouse contacted, ned offers discussed. Per spouse, he would like patient placed as soon as possible and is open to phone call's from the respective agencies to discuss placement Patient Self Care Activities:  . Attends all scheduled provider appointments . Unable to perform ADLs independently . Unable to  perform IADLs independently . Knowledge deficit of the out of home placement process  Please see past updates related to this goal by clicking on the "Past Updates" button in the selected goal          Follow Up Plan: Client will discuss bed offers with perspective facilities inorder to make an informed decision regarding  placement facility   [place], [place] Worker  [facility] Care Management [phone]

## 2019-11-02 NOTE — Patient Instructions (Addendum)
Thank you allowing the Chronic Care Management Team to be a part of your care! It was a pleasure speaking with you today!  1. Please anticipate a call from the long term facilities discussed to discuss bed offers CCM (Chronic Care Management) Team   Neldon Labella RN, BSN Nurse Care Coordinator  [phone]  [place], LCSW Clinical Social Worker [phone]  Goals Addressed              This Visit's Progress   .  Per patient's spouse "I think I need to look in to long term care for my wife" (pt-stated)        CARE PLAN ENTRY (see longitudinal plan of care for additional care plan information)  Current Barriers:  . Level of care concerns  Clinical Social Work Clinical Goal(s):  [name] Over the next 90 days, patient's spouse will work with SW to address concerns related to patient's level of care needs  Interventions:  . Collaboration phone call to [facility] for a follow up call . Collaboration phone call to [facility]-spoke with Gerald St[facility] bed available, will need need to pay out of pocket $260 semi private, $280.00 private-phone number provided for  patient's spouse to call regarding bed offer and financial responsibility . [facility] would need to do a home visit before making a bed offer . Patient' spouse contacted, ned offers discussed. Per spouse, he would like patient placed as soon as possible and is open to phone call's from the respective agencies to discuss placement Patient Self Care Activities:  . Attends all scheduled provider appointments . Unable to perform ADLs independently . Unable to perform IADLs independently . Knowledge deficit of the out of home placement process  Please see past updates related to this goal by clicking on the "Past Updates" button in the selected goal          The patient verbalized understanding of instructions provided today and declined a print copy of patient instruction materials.   Telephone  follow up appointment with care management team member scheduled for: 11/02/19

## 2019-11-03 ENCOUNTER — Encounter: Payer: Self-pay | Admitting: Urology

## 2019-11-05 ENCOUNTER — Telehealth: Payer: Self-pay | Admitting: *Deleted

## 2019-11-05 ENCOUNTER — Ambulatory Visit: Payer: Self-pay | Admitting: *Deleted

## 2019-11-05 DIAGNOSIS — D631 Anemia in chronic kidney disease: Secondary | ICD-10-CM | POA: Diagnosis not present

## 2019-11-05 DIAGNOSIS — N1832 Chronic kidney disease, stage 3b: Secondary | ICD-10-CM | POA: Diagnosis not present

## 2019-11-05 DIAGNOSIS — R809 Proteinuria, unspecified: Secondary | ICD-10-CM | POA: Diagnosis not present

## 2019-11-05 DIAGNOSIS — N3000 Acute cystitis without hematuria: Secondary | ICD-10-CM | POA: Diagnosis not present

## 2019-11-05 DIAGNOSIS — I129 Hypertensive chronic kidney disease with stage 1 through stage 4 chronic kidney disease, or unspecified chronic kidney disease: Secondary | ICD-10-CM | POA: Diagnosis not present

## 2019-11-05 DIAGNOSIS — E1122 Type 2 diabetes mellitus with diabetic chronic kidney disease: Secondary | ICD-10-CM | POA: Diagnosis not present

## 2019-11-05 DIAGNOSIS — N2581 Secondary hyperparathyroidism of renal origin: Secondary | ICD-10-CM | POA: Diagnosis not present

## 2019-11-05 NOTE — Chronic Care Management (AMB) (Signed)
  Chronic Care Management   Social Work Note  11/05/2019 Name: Amy [name] MRN: [id number] DOB: 1935-06-17  Amy [name] is a 84 y.o. year old female who sees Towanda Malkin, MD for primary care. The CCM team was consulted for assistance with Level of Care Concerns.   Phone call to patient's spouse to follow up on status of facility placement. Patient's spouse answered but was unable to talk as he was at a doctor's appointment. He will call back following his appointment.  SDOH (Social Determinants of Health) assessments performed: No     Outpatient Encounter Medications as of 11/05/2019  Medication Sig  . Accu-Chek Softclix Lancets lancets Use as instructed  . acetaminophen (TYLENOL) 325 MG tablet Take 325 mg by mouth every 6 (six) hours as needed.  [name] albuterol (VENTOLIN HFA) 108 (90 Base) MCG/ACT inhaler Inhale 1-2 puffs into the lungs every 4 (four) hours as needed.   [name] atorvastatin (LIPITOR) 20 MG tablet Take 1 tablet (20 mg total) by mouth daily at 6 PM.  . clopidogrel (PLAVIX) 75 MG tablet TAKE 1 TABLET EVERY DAY  . Cyanocobalamin (VITAMIN B12 PO) Take by mouth.  . donepezil (ARICEPT) 10 MG tablet Take 1 tablet (10 mg total) by mouth at bedtime.  . famotidine (PEPCID) 20 MG tablet Take 1 tablet (20 mg total) by mouth daily.  . ferrous sulfate 325 (65 FE) MG tablet Take 325 mg by mouth daily with breakfast. Pt taking every other day  . FLUoxetine (PROZAC) 10 MG capsule Take 1 capsule (10 mg total) by mouth daily.  [name] gabapentin (NEURONTIN) 300 MG capsule Take 1 capsule (300 mg total) by mouth 3 (three) times daily.  [name] HYDROcodone-acetaminophen (NORCO) 7.5-325 MG tablet Take 1 tablet by mouth every 8 (eight) hours as needed for moderate pain.  [name] ON 11/26/2019] HYDROcodone-acetaminophen (NORCO) 7.5-325 MG tablet Take 1 tablet by mouth every 8 (eight) hours as needed for moderate pain.  [name] ON 12/26/2019] HYDROcodone-acetaminophen (NORCO) 7.5-325 MG tablet  Take 1 tablet by mouth every 8 (eight) hours as needed for moderate pain.  [name] ipratropium-albuterol (DUONEB) 0.5-2.5 (3) MG/3ML SOLN INHALE THE CONTENTS OF 1 VIAL VIA NEBULIZER THREE TIMES DAILY AS DIRECTED  . pantoprazole (PROTONIX) 40 MG tablet Take 1 tablet (40 mg total) by mouth daily.  . QUEtiapine (SEROQUEL) 25 MG tablet Take 25 mg by mouth 2 (two) times daily. Takes at 2 pm and Total 75 mg a hs.  . QUEtiapine (SEROQUEL) 50 MG tablet Take 50 mg by mouth at bedtime.  . Thiamine HCl (VITAMIN B-1) 250 MG tablet Take 250 mg by mouth daily.   No facility-administered encounter medications on file as of 11/05/2019.    Goals Addressed   None     Follow Up Plan: SW will follow up with patient by phone over the next 7-10 business days  [place], [place] Worker  [facility] Care [facility] [phone]

## 2019-11-06 ENCOUNTER — Ambulatory Visit: Payer: Self-pay | Admitting: *Deleted

## 2019-11-06 DIAGNOSIS — F02818 Dementia in other diseases classified elsewhere, unspecified severity, with other behavioral disturbance: Secondary | ICD-10-CM

## 2019-11-06 DIAGNOSIS — I10 Essential (primary) hypertension: Secondary | ICD-10-CM

## 2019-11-06 NOTE — Patient Instructions (Signed)
Thank you allowing the Chronic Care Management Team to be a part of your care! It was a pleasure speaking with you today!  1. Please follow up with the long term care facilities regarding bed abilities  CCM (Chronic Care Management) Team   Neldon Labella RN, BSN Nurse Care Coordinator  [phone]   [place], [place] Social Worker [phone]  Goals Addressed              This Visit's Progress   .  Per patient's spouse "I think I need to look in to long term care for my wife" (pt-stated)        CARE PLAN ENTRY (see longitudinal plan of care for additional care plan information)  Current Barriers:  . Level of care concerns  Clinical Social Work Clinical Goal(s):  [name] Over the next 90 days, patient's spouse will work with SW to address concerns related to patient's level of care needs  Interventions:  . Confirmed that patient's spouse has not heard from any of the long term care facilities regarding bed availability . Contact information provided to patient's spouse for [facility] and [facility] for follow up. . Patient's spouse agreed to call to follow up regarding bed availability. Patient Self Care Activities:  . Attends all scheduled provider appointments . Unable to perform ADLs independently . Unable to perform IADLs independently . Knowledge deficit of the out of home placement process  Please see past updates related to this goal by clicking on the "Past Updates" button in the selected goal          The patient verbalized understanding of instructions provided today and declined a print copy of patient instruction materials.   Telephone follow up appointment with care management team member scheduled for:  11/12/19

## 2019-11-06 NOTE — Chronic Care Management (AMB) (Signed)
Chronic Care Management    Clinical Social Work Follow Up Note  11/06/2019 Name: CHIZUKO TRINE MRN: [id number] DOB: [birth year]  LAAIBAH WARTMAN is a 84 y.o. year old female who is a primary care patient of Towanda Malkin, MD. The CCM team was consulted for assistance with Level of Care Concerns.   Review of patient status, including review of consultants reports, other relevant assessments, and collaboration with appropriate care team members and the patient's provider was performed as part of comprehensive patient evaluation and provision of chronic care management services.    SDOH (Social Determinants of Health) assessments performed: No    Outpatient Encounter Medications as of 11/06/2019  Medication Sig  . Accu-Chek Softclix Lancets lancets Use as instructed  . acetaminophen (TYLENOL) 325 MG tablet Take 325 mg by mouth every 6 (six) hours as needed.  [name] albuterol (VENTOLIN HFA) 108 (90 Base) MCG/ACT inhaler Inhale 1-2 puffs into the lungs every 4 (four) hours as needed.   [name] atorvastatin (LIPITOR) 20 MG tablet Take 1 tablet (20 mg total) by mouth daily at 6 PM.  . clopidogrel (PLAVIX) 75 MG tablet TAKE 1 TABLET EVERY DAY  . Cyanocobalamin (VITAMIN B12 PO) Take by mouth.  . donepezil (ARICEPT) 10 MG tablet Take 1 tablet (10 mg total) by mouth at bedtime.  . famotidine (PEPCID) 20 MG tablet Take 1 tablet (20 mg total) by mouth daily.  . ferrous sulfate 325 (65 FE) MG tablet Take 325 mg by mouth daily with breakfast. Pt taking every other day  . FLUoxetine (PROZAC) 10 MG capsule Take 1 capsule (10 mg total) by mouth daily.  [name] gabapentin (NEURONTIN) 300 MG capsule Take 1 capsule (300 mg total) by mouth 3 (three) times daily.  [name] HYDROcodone-acetaminophen (NORCO) 7.5-325 MG tablet Take 1 tablet by mouth every 8 (eight) hours as needed for moderate pain.  [name] ON 11/26/2019] HYDROcodone-acetaminophen (NORCO) 7.5-325 MG tablet Take 1 tablet by mouth every 8 (eight) hours as  needed for moderate pain.  [name] ON 12/26/2019] HYDROcodone-acetaminophen (NORCO) 7.5-325 MG tablet Take 1 tablet by mouth every 8 (eight) hours as needed for moderate pain.  [name] ipratropium-albuterol (DUONEB) 0.5-2.5 (3) MG/3ML SOLN INHALE THE CONTENTS OF 1 VIAL VIA NEBULIZER THREE TIMES DAILY AS DIRECTED  . pantoprazole (PROTONIX) 40 MG tablet Take 1 tablet (40 mg total) by mouth daily.  . QUEtiapine (SEROQUEL) 25 MG tablet Take 25 mg by mouth 2 (two) times daily. Takes at 2 pm and Total 75 mg a hs.  . QUEtiapine (SEROQUEL) 50 MG tablet Take 50 mg by mouth at bedtime.  . Thiamine HCl (VITAMIN B-1) 250 MG tablet Take 250 mg by mouth daily.   No facility-administered encounter medications on file as of 11/06/2019.     Goals Addressed              This Visit's Progress   .  Per patient's spouse "I think I need to look in to long term care for my wife" (pt-stated)        CARE PLAN ENTRY (see longitudinal plan of care for additional care plan information)  Current Barriers:  . Level of care concerns  Clinical Social Work Clinical Goal(s):  [name] Over the next 90 days, patient's spouse will work with SW to address concerns related to patient's level of care needs  Interventions:  . Confirmed that patient's spouse has not heard from any of the long term care facilities regarding bed availability . Contact information provided  to patient's spouse for [facility] and [facility] for follow up. . Patient's spouse agreed to call to follow up regarding bed availability. Patient Self Care Activities:  . Attends all scheduled provider appointments . Unable to perform ADLs independently . Unable to perform IADLs independently . Knowledge deficit of the out of home placement process  Please see past updates related to this goal by clicking on the "Past Updates" button in the selected goal          Follow Up Plan: SW will follow up with patient by phone over the next 7-14 business days  regarding potential bed offers   [place], [place] Worker  [facility] Care Management [phone]

## 2019-11-07 ENCOUNTER — Ambulatory Visit: Payer: Self-pay | Admitting: *Deleted

## 2019-11-07 DIAGNOSIS — F02818 Dementia in other diseases classified elsewhere, unspecified severity, with other behavioral disturbance: Secondary | ICD-10-CM

## 2019-11-07 DIAGNOSIS — I10 Essential (primary) hypertension: Secondary | ICD-10-CM

## 2019-11-07 NOTE — Chronic Care Management (AMB) (Signed)
Chronic Care Management    Clinical Social Work Follow Up Note  11/07/2019 Name: Amy [name] MRN: [id number] DOB: 16-May-1936  Amy [name] is a 84 y.o. year old female who is a primary care patient of Towanda Malkin, MD. The CCM team was consulted for assistance with Level of Care Concerns.   Review of patient status, including review of consultants reports, other relevant assessments, and collaboration with appropriate care team members and the patient's provider was performed as part of comprehensive patient evaluation and provision of chronic care management services.    SDOH (Social Determinants of Health) assessments performed: No    Outpatient Encounter Medications as of 11/07/2019  Medication Sig  . Accu-Chek Softclix Lancets lancets Use as instructed  . acetaminophen (TYLENOL) 325 MG tablet Take 325 mg by mouth every 6 (six) hours as needed.  [name] albuterol (VENTOLIN HFA) 108 (90 Base) MCG/ACT inhaler Inhale 1-2 puffs into the lungs every 4 (four) hours as needed.   [name] atorvastatin (LIPITOR) 20 MG tablet Take 1 tablet (20 mg total) by mouth daily at 6 PM.  . clopidogrel (PLAVIX) 75 MG tablet TAKE 1 TABLET EVERY DAY  . Cyanocobalamin (VITAMIN B12 PO) Take by mouth.  . donepezil (ARICEPT) 10 MG tablet Take 1 tablet (10 mg total) by mouth at bedtime.  . famotidine (PEPCID) 20 MG tablet Take 1 tablet (20 mg total) by mouth daily.  . ferrous sulfate 325 (65 FE) MG tablet Take 325 mg by mouth daily with breakfast. Pt taking every other day  . FLUoxetine (PROZAC) 10 MG capsule Take 1 capsule (10 mg total) by mouth daily.  [name] gabapentin (NEURONTIN) 300 MG capsule Take 1 capsule (300 mg total) by mouth 3 (three) times daily.  [name] HYDROcodone-acetaminophen (NORCO) 7.5-325 MG tablet Take 1 tablet by mouth every 8 (eight) hours as needed for moderate pain.  [name] ON 11/26/2019] HYDROcodone-acetaminophen (NORCO) 7.5-325 MG tablet Take 1 tablet by mouth every 8 (eight) hours as  needed for moderate pain.  [name] ON 12/26/2019] HYDROcodone-acetaminophen (NORCO) 7.5-325 MG tablet Take 1 tablet by mouth every 8 (eight) hours as needed for moderate pain.  [name] ipratropium-albuterol (DUONEB) 0.5-2.5 (3) MG/3ML SOLN INHALE THE CONTENTS OF 1 VIAL VIA NEBULIZER THREE TIMES DAILY AS DIRECTED  . pantoprazole (PROTONIX) 40 MG tablet Take 1 tablet (40 mg total) by mouth daily.  . QUEtiapine (SEROQUEL) 25 MG tablet Take 25 mg by mouth 2 (two) times daily. Takes at 2 pm and Total 75 mg a hs.  . QUEtiapine (SEROQUEL) 50 MG tablet Take 50 mg by mouth at bedtime.  . Thiamine HCl (VITAMIN B-1) 250 MG tablet Take 250 mg by mouth daily.   No facility-administered encounter medications on file as of 11/07/2019.     Goals Addressed              This Visit's Progress   .  Per patient's spouse "I think I need to look in to long term care for my wife" (pt-stated)        CARE PLAN ENTRY (see longitudinal plan of care for additional care plan information)  Current Barriers:  . Level of care concerns  Clinical Social Work Clinical Goal(s):  [name] Over the next 90 days, patient's spouse will work with SW to address concerns related to patient's level of care needs  Interventions:  . Confirmed that patient's spouse has scheduled a home visit with [facility] for today . Confirmed that patient's spouse will see how  the visit with [facility] goes before moving on to another facility. He would prefer [facility] as it is close to his home . Patient's spouse agreed to continue to follow up with facilities as well as the elder law attorney regarding patient's long term care needs Patient Self Care Activities:  . Attends all scheduled provider appointments . Unable to perform ADLs independently . Unable to perform IADLs independently . Knowledge deficit of the out of home placement process  Please see past updates related to this goal by clicking on the "Past Updates" button in the  selected goal          Follow Up Plan: SW will follow up with patient by phone over the next 7-14 days   [place], [place] Worker  [facility] Care Management [phone]

## 2019-11-07 NOTE — Patient Instructions (Signed)
Thank you allowing the Chronic Care Management Team to be a part of your care! It was a pleasure speaking with you today!  1. Please follow up with [facility] today regarding long term care for patient.  CCM (Chronic Care Management) Team   Neldon Labella RN, BSN Nurse Care Coordinator  [phone]  [place], LCSW Clinical Social Worker [phone]  Goals Addressed              This Visit's Progress   .  Per patient's spouse "I think I need to look in to long term care for my wife" (pt-stated)        CARE PLAN ENTRY (see longitudinal plan of care for additional care plan information)  Current Barriers:  . Level of care concerns  Clinical Social Work Clinical Goal(s):  [name] Over the next 90 days, patient's spouse will work with SW to address concerns related to patient's level of care needs  Interventions:  . Confirmed that patient's spouse has scheduled a home visit with [facility] for today . Confirmed that patient's spouse will see how the visit with [facility] goes before moving on to another facility. He would prefer [facility] as it is close to his home . Patient's spouse agreed to continue to follow up with facilities as well as the elder law attorney regarding patient's long term care needs Patient Self Care Activities:  . Attends all scheduled provider appointments . Unable to perform ADLs independently . Unable to perform IADLs independently . Knowledge deficit of the out of home placement process  Please see past updates related to this goal by clicking on the "Past Updates" button in the selected goal          The patient verbalized understanding of instructions provided today and declined a print copy of patient instruction materials.   Telephone follow up appointment with care management team member scheduled for: 11/14/19

## 2019-11-08 ENCOUNTER — Ambulatory Visit: Payer: Self-pay | Admitting: *Deleted

## 2019-11-08 DIAGNOSIS — G301 Alzheimer's disease with late onset: Secondary | ICD-10-CM

## 2019-11-08 DIAGNOSIS — I10 Essential (primary) hypertension: Secondary | ICD-10-CM

## 2019-11-08 DIAGNOSIS — F02818 Dementia in other diseases classified elsewhere, unspecified severity, with other behavioral disturbance: Secondary | ICD-10-CM

## 2019-11-08 NOTE — Chronic Care Management (AMB) (Signed)
Chronic Care Management    Clinical Social Work Follow Up Note  11/08/2019 Name: Amy [name] MRN: [id number] DOB: January 13, 1936  Amy [name] is a 84 y.o. year old female who is a primary care patient of Towanda Malkin, MD. The CCM team was consulted for assistance with Level of Care Concerns.   Review of patient status, including review of consultants reports, other relevant assessments, and collaboration with appropriate care team members and the patient's provider was performed as part of comprehensive patient evaluation and provision of chronic care management services.    SDOH (Social Determinants of Health) assessments performed: No    Outpatient Encounter Medications as of 11/08/2019  Medication Sig  . Accu-Chek Softclix Lancets lancets Use as instructed  . acetaminophen (TYLENOL) 325 MG tablet Take 325 mg by mouth every 6 (six) hours as needed.  [name] albuterol (VENTOLIN HFA) 108 (90 Base) MCG/ACT inhaler Inhale 1-2 puffs into the lungs every 4 (four) hours as needed.   [name] atorvastatin (LIPITOR) 20 MG tablet Take 1 tablet (20 mg total) by mouth daily at 6 PM.  . clopidogrel (PLAVIX) 75 MG tablet TAKE 1 TABLET EVERY DAY  . Cyanocobalamin (VITAMIN B12 PO) Take by mouth.  . donepezil (ARICEPT) 10 MG tablet Take 1 tablet (10 mg total) by mouth at bedtime.  . famotidine (PEPCID) 20 MG tablet Take 1 tablet (20 mg total) by mouth daily.  . ferrous sulfate 325 (65 FE) MG tablet Take 325 mg by mouth daily with breakfast. Pt taking every other day  . FLUoxetine (PROZAC) 10 MG capsule Take 1 capsule (10 mg total) by mouth daily.  [name] gabapentin (NEURONTIN) 300 MG capsule Take 1 capsule (300 mg total) by mouth 3 (three) times daily.  [name] HYDROcodone-acetaminophen (NORCO) 7.5-325 MG tablet Take 1 tablet by mouth every 8 (eight) hours as needed for moderate pain.  [name] ON 11/26/2019] HYDROcodone-acetaminophen (NORCO) 7.5-325 MG tablet Take 1 tablet by mouth every 8 (eight) hours as  needed for moderate pain.  [name] ON 12/26/2019] HYDROcodone-acetaminophen (NORCO) 7.5-325 MG tablet Take 1 tablet by mouth every 8 (eight) hours as needed for moderate pain.  [name] ipratropium-albuterol (DUONEB) 0.5-2.5 (3) MG/3ML SOLN INHALE THE CONTENTS OF 1 VIAL VIA NEBULIZER THREE TIMES DAILY AS DIRECTED  . pantoprazole (PROTONIX) 40 MG tablet Take 1 tablet (40 mg total) by mouth daily.  . QUEtiapine (SEROQUEL) 25 MG tablet Take 25 mg by mouth 2 (two) times daily. Takes at 2 pm and Total 75 mg a hs.  . QUEtiapine (SEROQUEL) 50 MG tablet Take 50 mg by mouth at bedtime.  . Thiamine HCl (VITAMIN B-1) 250 MG tablet Take 250 mg by mouth daily.   No facility-administered encounter medications on file as of 11/08/2019.     Goals Addressed              This Visit's Progress   .  Per patient's spouse "I think I need to look in to long term care for my wife" (pt-stated)        CARE PLAN ENTRY (see longitudinal plan of care for additional care plan information)  Current Barriers:  . Level of care concerns  Clinical Social Work Clinical Goal(s):  [name] Over the next 90 days, patient's spouse will work with SW to address concerns related to patient's level of care needs  Interventions:  . Return phone call to patient's spouse to follow up on facility care placement, voicemail message left requesting a return call . Phone  call to Admissions Director Gerald Stabs from [facility] who confirms bed offer. He would like patient to admit by next Wednesday. It was confirmed that she will need a COVID test and a copy of her COVID vaccination card before she is admitted . This Education officer, museum to request COVID test through the Health Department Patient Self Care Activities:  . Attends all scheduled provider appointments . Unable to perform ADLs independently . Unable to perform IADLs independently . Knowledge deficit of the out of home placement process  Please see past updates related to this goal by clicking  on the "Past Updates" button in the selected goal          Follow Up Plan: SW will follow up with patient by phone over the next 7-10 days regarding facility transition   Elliot Gurney, [place] Worker  [facility] Care [facility] [phone]

## 2019-11-08 NOTE — Patient Instructions (Signed)
Thank you allowing the Chronic Care Management Team to be a part of your care! It was a pleasure speaking with you today!  1. Please follow up with [facility] regarding bed offer and admission date  CCM (Chronic Care Management) Team   Neldon Labella RN, BSN Nurse Care Coordinator  [phone]  [place], LCSW Clinical Social Worker [phone]  Goals Addressed              This Visit's Progress   .  Per patient's spouse "I think I need to look in to long term care for my wife" (pt-stated)        CARE PLAN ENTRY (see longitudinal plan of care for additional care plan information)  Current Barriers:  . Level of care concerns  Clinical Social Work Clinical Goal(s):  [name] Over the next 90 days, patient's spouse will work with SW to address concerns related to patient's level of care needs  Interventions:  . Return phone call to patient's spouse to follow up on facility care placement, voicemail message left requesting a return call . Phone call to Admissions Director Gerald Stabs from [facility] who confirms bed offer. He would like patient to admit by next Wednesday. It was confirmed that she will need a COVID test and a copy of her COVID vaccination card before she is admitted . This Education officer, museum to request COVID test through the Health Department Patient Self Care Activities:  . Attends all scheduled provider appointments . Unable to perform ADLs independently . Unable to perform IADLs independently . Knowledge deficit of the out of home placement process  Please see past updates related to this goal by clicking on the "Past Updates" button in the selected goal          The patient verbalized understanding of instructions provided today and declined a print copy of patient instruction materials.   Telephone follow up appointment with care management team member scheduled for: 11/12/19

## 2019-11-09 ENCOUNTER — Ambulatory Visit: Payer: Self-pay | Admitting: *Deleted

## 2019-11-09 ENCOUNTER — Emergency Department
Admission: EM | Admit: 2019-11-09 | Discharge: 2019-11-10 | Disposition: A | Discharge status: Home / Self Care | Payer: Medicare HMO | Attending: Emergency Medicine | Admitting: Emergency Medicine

## 2019-11-09 ENCOUNTER — Encounter: Payer: Self-pay | Admitting: Emergency Medicine

## 2019-11-09 ENCOUNTER — Other Ambulatory Visit: Payer: Self-pay

## 2019-11-09 ENCOUNTER — Emergency Department: Payer: Medicare HMO

## 2019-11-09 DIAGNOSIS — R102 Pelvic and perineal pain: Secondary | ICD-10-CM | POA: Diagnosis not present

## 2019-11-09 DIAGNOSIS — N184 Chronic kidney disease, stage 4 (severe): Secondary | ICD-10-CM | POA: Insufficient documentation

## 2019-11-09 DIAGNOSIS — I129 Hypertensive chronic kidney disease with stage 1 through stage 4 chronic kidney disease, or unspecified chronic kidney disease: Secondary | ICD-10-CM | POA: Diagnosis not present

## 2019-11-09 DIAGNOSIS — Z87891 Personal history of nicotine dependence: Secondary | ICD-10-CM | POA: Insufficient documentation

## 2019-11-09 DIAGNOSIS — Z85528 Personal history of other malignant neoplasm of kidney: Secondary | ICD-10-CM | POA: Insufficient documentation

## 2019-11-09 DIAGNOSIS — Z7902 Long term (current) use of antithrombotics/antiplatelets: Secondary | ICD-10-CM | POA: Diagnosis not present

## 2019-11-09 DIAGNOSIS — E1122 Type 2 diabetes mellitus with diabetic chronic kidney disease: Secondary | ICD-10-CM | POA: Insufficient documentation

## 2019-11-09 DIAGNOSIS — G301 Alzheimer's disease with late onset: Secondary | ICD-10-CM | POA: Insufficient documentation

## 2019-11-09 DIAGNOSIS — J449 Chronic obstructive pulmonary disease, unspecified: Secondary | ICD-10-CM | POA: Diagnosis not present

## 2019-11-09 DIAGNOSIS — K5900 Constipation, unspecified: Secondary | ICD-10-CM

## 2019-11-09 DIAGNOSIS — Z960 Presence of urogenital implants: Secondary | ICD-10-CM | POA: Diagnosis not present

## 2019-11-09 DIAGNOSIS — N3090 Cystitis, unspecified without hematuria: Secondary | ICD-10-CM

## 2019-11-09 DIAGNOSIS — F02818 Dementia in other diseases classified elsewhere, unspecified severity, with other behavioral disturbance: Secondary | ICD-10-CM

## 2019-11-09 DIAGNOSIS — I10 Essential (primary) hypertension: Secondary | ICD-10-CM

## 2019-11-09 DIAGNOSIS — Z86718 Personal history of other venous thrombosis and embolism: Secondary | ICD-10-CM | POA: Diagnosis not present

## 2019-11-09 DIAGNOSIS — N200 Calculus of kidney: Secondary | ICD-10-CM | POA: Diagnosis not present

## 2019-11-09 LAB — CBC
HCT: 35.3 % — ABNORMAL LOW (ref 36.0–46.0)
Hemoglobin: 11.7 g/dL — ABNORMAL LOW (ref 12.0–15.0)
MCH: 27.1 pg (ref 26.0–34.0)
MCHC: 33.1 g/dL (ref 30.0–36.0)
MCV: 81.7 fL (ref 80.0–100.0)
Platelets: 233 10*3/uL (ref 150–400)
RBC: 4.32 MIL/uL (ref 3.87–5.11)
RDW: 17.2 % — ABNORMAL HIGH (ref 11.5–15.5)
WBC: 13.3 10*3/uL — ABNORMAL HIGH (ref 4.0–10.5)
nRBC: 0 % (ref 0.0–0.2)

## 2019-11-09 LAB — COMPREHENSIVE METABOLIC PANEL
ALT: 12 U/L (ref 0–44)
AST: 16 U/L (ref 15–41)
Albumin: 3.4 g/dL — ABNORMAL LOW (ref 3.5–5.0)
Alkaline Phosphatase: 141 U/L — ABNORMAL HIGH (ref 38–126)
Anion gap: 10 (ref 5–15)
BUN: 28 mg/dL — ABNORMAL HIGH (ref 8–23)
CO2: 22 mmol/L (ref 22–32)
Calcium: 8.6 mg/dL — ABNORMAL LOW (ref 8.9–10.3)
Chloride: 104 mmol/L (ref 98–111)
Creatinine, Ser: 1.75 mg/dL — ABNORMAL HIGH (ref 0.44–1.00)
GFR calc Af Amer: 31 mL/min — ABNORMAL LOW (ref >60)
GFR calc non Af Amer: 27 mL/min — ABNORMAL LOW (ref >60)
Glucose, Bld: 173 mg/dL — ABNORMAL HIGH (ref 70–99)
Potassium: 5.2 mmol/L — ABNORMAL HIGH (ref 3.5–5.1)
Sodium: 136 mmol/L (ref 135–145)
Total Bilirubin: 1 mg/dL (ref 0.3–1.2)
Total Protein: 6.7 g/dL (ref 6.5–8.1)

## 2019-11-09 LAB — URINALYSIS, COMPLETE (UACMP) WITH MICROSCOPIC
Bilirubin Urine: NEGATIVE
Glucose, UA: NEGATIVE mg/dL
Ketones, ur: NEGATIVE mg/dL
Leukocytes,Ua: NEGATIVE
Nitrite: NEGATIVE
Protein, ur: 100 mg/dL — AB
RBC / HPF: >50 RBC/hpf — ABNORMAL HIGH (ref 0–5)
Specific Gravity, Urine: 1.01 (ref 1.005–1.030)
Squamous Epithelial / HPF: NONE SEEN (ref 0–5)
pH: 5 (ref 5.0–8.0)

## 2019-11-09 LAB — LIPASE, BLOOD: Lipase: 29 U/L (ref 11–51)

## 2019-11-09 NOTE — ED Notes (Signed)
This RN and Beth NT performed in and out cath per MD order.  This RN and Beth NT witness for digital disimpaction for MD [employer]

## 2019-11-09 NOTE — Discharge Instructions (Addendum)
Patient's pelvic pain is most likely secondary to the large stool ball that we removed.  Patient take 1 capful of MiraLAX daily to help keep her stools soft.  Her urine still looked like it had an infection and it but she is not completing her course of antibiotics yet.  You should follow-up with your primary doctor on Monday for the urine culture results.  You may need to switch the antibiotic if it is not effective against the infection that is encouraging.  Return to the ER if she develops fevers, worsening pain or any other concerns  IMPRESSION: 1. Mild right periureteral stranding with inflammatory changes around the superior bladder which could be due to cystitis and ureteritis. 2. Nonobstructing right renal calculus 3. Multiple right renal cysts, several which appear to be slightly hyperdense which could represent proteinaceous/hemorrhagic cyst, unchanged since prior PET-CT February 28, 2019. 4.  Aortic Atherosclerosis (ICD10-I70.0).

## 2019-11-09 NOTE — Patient Instructions (Addendum)
Thank you allowing the Chronic Care Management Team to be a part of your care! It was a pleasure speaking with you today!  1. Please call this social worker following tour of [facility]  CCM (Chronic Care Management) Team   Neldon Labella RN, BSN Nurse Care Coordinator  [phone]  [place], [place] Social Worker [phone]  Goals Addressed              This Visit's Progress   .  Per patient's spouse "I think I need to look in to long term care for my wife" (pt-stated)        CARE PLAN ENTRY (see longitudinal plan of care for additional care plan information)  Current Barriers:  . Level of care concerns  Clinical Social Work Clinical Goal(s):  [name] Over the next 90 days, patient's spouse will work with SW to address concerns related to patient's level of care needs  Interventions:  . Phone call to patient to confirm that he would like to tour the facility before patient is admitted . He will call the administrator to schedule a tour . Planned transition for 11/16/19  . COVID test will be requested through the health department, patient's spouse informed of need to provide a copy of her COVID vaccination card before she is admitted .  Patient Self Care Activities:  . Attends all scheduled provider appointments . Unable to perform ADLs independently . Unable to perform IADLs independently . Knowledge deficit of the out of home placement process  Please see past updates related to this goal by clicking on the "Past Updates" button in the selected goal          The patient verbalized understanding of instructions provided today and declined a print copy of patient instruction materials.   Telephone follow up appointment with care management team member scheduled for: 11/12/19

## 2019-11-09 NOTE — Patient Instructions (Signed)
Thank you allowing the Chronic Care Management Team to be a part of your care! It was a pleasure speaking with you today!  1. Please anticipate a home visit from [facility] scheduled for 11/12/19 at 11am 2. [place] will call to schedule as well  CCM (Chronic Care Management) Team   Neldon Labella RN, BSN Nurse Care Coordinator  [phone]   [place], LCSW Clinical Social Worker [phone]  Goals Addressed              This Visit's Progress   .  Per patient's spouse "I think I need to look in to long term care for my wife" (pt-stated)        CARE PLAN ENTRY (see longitudinal plan of care for additional care plan information)  Current Barriers:  . Level of care concerns  Clinical Social Work Clinical Goal(s):  [name] Over the next 90 days, patient's spouse will work with SW to address concerns related to patient's level of care needs  Interventions:  . Patient's spouse now having second thoughts about patient being admitted to [facility] and wanting this social worker to look into [facility] [employer] [facility] . [place] home visit scheduled for Monday, 11/12/19 at 11am . Phone call to  [place] [facility], contact information provided so that patient's spouse can be contacted  . Processed patient's spouse's concerns and expectations regarding bed at [facility] reality testing provided   Patient Self Care Activities:  . Attends all scheduled provider appointments . Unable to perform ADLs independently . Unable to perform IADLs independently . Knowledge deficit of the out of home placement process  Please see past updates related to this goal by clicking on the "Past Updates" button in the selected goal          The patient verbalized understanding of instructions provided today and declined a print copy of patient instruction materials.   Telephone follow up appointment with care management team member  scheduled for: 11/12/19

## 2019-11-09 NOTE — Chronic Care Management (AMB) (Signed)
Chronic Care Management    Clinical Social Work Follow Up Note  11/09/2019 Name: Amy [name] MRN: [id number] DOB: Jun 08, 1935  Amy [name] is a 84 y.o. year old female who is a primary care patient of Towanda Malkin, MD. The CCM team was consulted for assistance with Level of Care Concerns.   Review of patient status, including review of consultants reports, other relevant assessments, and collaboration with appropriate care team members and the patient's provider was performed as part of comprehensive patient evaluation and provision of chronic care management services.    SDOH (Social Determinants of Health) assessments performed: No    Outpatient Encounter Medications as of 11/08/2019  Medication Sig  . Accu-Chek Softclix Lancets lancets Use as instructed  . acetaminophen (TYLENOL) 325 MG tablet Take 325 mg by mouth every 6 (six) hours as needed.  [name] albuterol (VENTOLIN HFA) 108 (90 Base) MCG/ACT inhaler Inhale 1-2 puffs into the lungs every 4 (four) hours as needed.   [name] atorvastatin (LIPITOR) 20 MG tablet Take 1 tablet (20 mg total) by mouth daily at 6 PM.  . clopidogrel (PLAVIX) 75 MG tablet TAKE 1 TABLET EVERY DAY  . Cyanocobalamin (VITAMIN B12 PO) Take by mouth.  . donepezil (ARICEPT) 10 MG tablet Take 1 tablet (10 mg total) by mouth at bedtime.  . famotidine (PEPCID) 20 MG tablet Take 1 tablet (20 mg total) by mouth daily.  . ferrous sulfate 325 (65 FE) MG tablet Take 325 mg by mouth daily with breakfast. Pt taking every other day  . FLUoxetine (PROZAC) 10 MG capsule Take 1 capsule (10 mg total) by mouth daily.  [name] gabapentin (NEURONTIN) 300 MG capsule Take 1 capsule (300 mg total) by mouth 3 (three) times daily.  [name] HYDROcodone-acetaminophen (NORCO) 7.5-325 MG tablet Take 1 tablet by mouth every 8 (eight) hours as needed for moderate pain.  [name] ON 11/26/2019] HYDROcodone-acetaminophen (NORCO) 7.5-325 MG tablet Take 1 tablet by mouth every 8 (eight) hours as  needed for moderate pain.  [name] ON 12/26/2019] HYDROcodone-acetaminophen (NORCO) 7.5-325 MG tablet Take 1 tablet by mouth every 8 (eight) hours as needed for moderate pain.  [name] ipratropium-albuterol (DUONEB) 0.5-2.5 (3) MG/3ML SOLN INHALE THE CONTENTS OF 1 VIAL VIA NEBULIZER THREE TIMES DAILY AS DIRECTED  . pantoprazole (PROTONIX) 40 MG tablet Take 1 tablet (40 mg total) by mouth daily.  . QUEtiapine (SEROQUEL) 25 MG tablet Take 25 mg by mouth 2 (two) times daily. Takes at 2 pm and Total 75 mg a hs.  . QUEtiapine (SEROQUEL) 50 MG tablet Take 50 mg by mouth at bedtime.  . Thiamine HCl (VITAMIN B-1) 250 MG tablet Take 250 mg by mouth daily.   No facility-administered encounter medications on file as of 11/08/2019.     Goals Addressed              This Visit's Progress   .  Per patient's spouse "I think I need to look in to long term care for my wife" (pt-stated)        CARE PLAN ENTRY (see longitudinal plan of care for additional care plan information)  Current Barriers:  . Level of care concerns  Clinical Social Work Clinical Goal(s):  [name] Over the next 90 days, patient's spouse will work with SW to address concerns related to patient's level of care needs  Interventions:  . Phone call to patient to confirm that he would like to tour the facility before patient is admitted . He will call  the administrator to schedule a tour . Planned transition for 11/16/19  . COVID test will be requested through the health department, patient's spouse informed of need to provide a copy of her COVID vaccination card before she is admitted .  Patient Self Care Activities:  . Attends all scheduled provider appointments . Unable to perform ADLs independently . Unable to perform IADLs independently . Knowledge deficit of the out of home placement process  Please see past updates related to this goal by clicking on the "Past Updates" button in the selected goal          Follow Up Plan: SW will  follow up with patient by phone over the next 7-10 days   [place], [place] Worker  [facility] Care Management [phone]

## 2019-11-09 NOTE — ED Triage Notes (Signed)
Patient presents to the ED with pelvic and vaginal pain that began at approx. 4pm.  Patient denies vomiting, diarrhea, burning urination or urinary frequency.  Patient has difficulty describing the pain.  Patient denies any history of similar pain.  Patient is in no obvious distress at this time.

## 2019-11-09 NOTE — ED Provider Notes (Signed)
[facility] Emergency Department Provider Note  ____________________________________________   First MD Initiated Contact with Patient 11/09/19 2024     (approximate)  I have reviewed the triage vital signs and the nursing notes.   HISTORY  Chief Complaint Pelvic Pain    HPI Amy [name] is a 84 y.o. female with DVT, left AKA, diabetes, hypertension, hyperlipidemia, not on anticoagulation who comes in for lower pelvic pain.  Patient not the best historian but it sounds like she has been having Some pain in her pelvis and vagina since 4 PM.  According to husband she is had 2 episodes of diarrhea and he is not sure if she is constipated. The pain has been intermittent, mild, nothing makes better, nothing makes worse.  Patient followed by Dr. Juleen China and recently started on ciprofloxacin 3 to 4 days ago for UTI.  Patient has a single kidney secondary to renal cancer.          Past Medical History:  Diagnosis Date  . Anemia    low iron  . Anxiety   . Arthritis   . Bilateral cataracts   . Bronchitis    hx of   . Cataract   . Chronic kidney disease   . Depression   . Diabetes mellitus without complication ([place])   . 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 Emphysema of lung ([place])   . GERD (gastroesophageal reflux disease)   . Headache   . History of frequent urinary tract infections   . Hx of renal cell cancer    LEFT  . Hyperlipidemia   . Hypertension   . Numbness and tingling    right hand   . Obesity   . Osteopenia 10/05/2017  . Peripheral artery disease ([place])   . Urinary frequency   . Urinary incontinence     Patient Active Problem List   Diagnosis Date Noted  . Chronic rhinitis 10/11/2019  . Bone metastasis ([place]) 03/13/2019  . Rib lesion 03/08/2019  . Right lower lobe pulmonary nodule 02/08/2019  . Cancer of intra-abdominal lymph nodes ([place]) 01/19/2019  . Genital labial ulcer  12/06/2018  . B12 deficiency 11/05/2018  . Folate deficiency 11/05/2018  . 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 Stomach irritation   . Obesity (BMI 30.0-34.9) 09/11/2018  . Pulmonary nodules 08/27/2018  . Goals of care, counseling/discussion   . Palliative care by specialist   . Hypoxia 07/26/2018  . HCAP (healthcare-associated pneumonia) 07/02/2018  . Hypoxemia   . Renal cell carcinoma ([place])   . Lung nodule 04/23/2018  . Hypertensive renal disease 11/08/2017  . Secondary hyperparathyroidism of renal origin ([place]) 11/08/2017  . QT prolongation 10/27/2017  . Osteopenia 10/05/2017  . Choledocholithiasis 08/13/2017  . Chronic pain syndrome 04/05/2017  . Depression 11/15/2016  . Iron deficiency anemia 09/08/2016  . Anemia 06/29/2016  . Late onset Alzheimer's disease with behavioral disturbance ([place]) 06/14/2016  . S/P AKA (above knee amputation) unilateral, left ([place]) 06/08/2016  . Wheelchair dependent 03/11/2016  . 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 S/p nephrectomy 01/29/2015  . Renal neoplasm 01/15/2015  . Abnormal EKG 01/02/2015  . Renal mass 12/05/2014  . GERD (gastroesophageal reflux disease) 11/20/2014  . GAD (generalized anxiety disorder) 11/20/2014  . AB (asthmatic bronchitis) 11/20/2014  . Aortic atherosclerosis ([place]) 11/20/2014  . Cataract 11/20/2014  . Leg pain 11/20/2014  . Continuous opioid dependence ([place]) 11/20/2014  .  CKD (chronic kidney disease) stage 4, GFR 15-29 ml/min (HCC) 11/20/2014  . COPD (chronic obstructive pulmonary disease) ([place]) 11/20/2014  . Excessive falling 11/20/2014  . Fatty infiltration of liver 11/20/2014  . Hyperlipidemia 11/20/2014  . Anterior knee pain 11/20/2014  . LBP (low back pain) 11/20/2014  . Malaise and fatigue 11/20/2014  . Abnormal presence of protein in urine 11/20/2014  . PVD (peripheral vascular disease) ([place]) 11/20/2014  . Allergic rhinitis 11/20/2014  . At risk for falling 11/20/2014  . Anxiety  11/05/2014  . Elevated serum creatinine 11/05/2014  . Diabetes mellitus type 2, controlled ([place]) 11/05/2014  . H/O adenomatous polyp of colon 07/27/2010    Past Surgical History:  Procedure Laterality Date  . ABDOMINAL HYSTERECTOMY  1978  . CARPAL TUNNEL RELEASE Bilateral   . CHOLECYSTECTOMY    . COLONOSCOPY    . COLONOSCOPY WITH ESOPHAGOGASTRODUODENOSCOPY (EGD)    . ERCP N/A 08/19/2017   Procedure: ENDOSCOPIC RETROGRADE CHOLANGIOPANCREATOGRAPHY (ERCP);  Surgeon: Arta Silence, MD;  Location[facility] ENDOSCOPY;  Service: Endoscopy;  Laterality: N/A;  . ESOPHAGOGASTRODUODENOSCOPY (EGD) WITH PROPOFOL N/A 10/12/2018   Procedure: ESOPHAGOGASTRODUODENOSCOPY (EGD) WITH PROPOFOL;  Surgeon: Virgel Manifold, MD;  Location: [facility] ENDOSCOPY;  Service: Endoscopy;  Laterality: N/A;  . LEG AMPUTATION     above the knee / left   . ROBOT ASSISTED LAPAROSCOPIC NEPHRECTOMY Left 01/15/2015   Procedure: ROBOTIC ASSISTED LAPAROSCOPIC RADICAL NEPHRECTOMY;  Surgeon: Alexis Frock, MD;  Location: [facility] ORS;  Service: Urology;  Laterality: Left;  . stent placement right leg       Prior to Admission medications   Medication Sig Start Date End Date Taking? Authorizing Provider  Accu-Chek Softclix Lancets lancets Use as instructed 10/11/19   Towanda Malkin, MD  acetaminophen (TYLENOL) 325 MG tablet Take 325 mg by mouth every 6 (six) hours as needed.    [provider]  albuterol (VENTOLIN HFA) 108 (90 Base) MCG/ACT inhaler Inhale 1-2 puffs into the lungs every 4 (four) hours as needed.  06/15/17   [provider]  atorvastatin (LIPITOR) 20 MG tablet Take 1 tablet (20 mg total) by mouth daily at 6 PM. 10/18/19   Towanda Malkin, MD  clopidogrel (PLAVIX) 75 MG tablet TAKE 1 TABLET EVERY DAY 07/12/19   Algernon Huxley, MD  Cyanocobalamin (VITAMIN B12 PO) Take by mouth.    [provider]  donepezil (ARICEPT) 10 MG tablet Take 1 tablet (10 mg total) by mouth at bedtime. 05/08/19    [name], FNP  famotidine (PEPCID) 20 MG tablet Take 1 tablet (20 mg total) by mouth daily. 01/19/19   [name], FNP  ferrous sulfate 325 (65 FE) MG tablet Take 325 mg by mouth daily with breakfast. Pt taking every other day    [provider]  FLUoxetine (PROZAC) 10 MG capsule Take 1 capsule (10 mg total) by mouth daily. 07/22/18   Nicholes Mango, MD  gabapentin (NEURONTIN) 300 MG capsule Take 1 capsule (300 mg total) by mouth 3 (three) times daily. 07/24/19   Gillis Santa, MD  HYDROcodone-acetaminophen (NORCO) 7.5-325 MG tablet Take 1 tablet by mouth every 8 (eight) hours as needed for moderate pain. 10/27/19 11/26/19  Gillis Santa, MD  HYDROcodone-acetaminophen (NORCO) 7.5-325 MG tablet Take 1 tablet by mouth every 8 (eight) hours as needed for moderate pain. 11/26/19 12/26/19  Gillis Santa, MD  HYDROcodone-acetaminophen (NORCO) 7.5-325 MG tablet Take 1 tablet by mouth every 8 (eight) hours as needed for moderate pain. 12/26/19 01/25/20  Lateef,  Bilal, MD  ipratropium-albuterol (DUONEB) 0.5-2.5 (3) MG/3ML SOLN INHALE THE CONTENTS OF 1 VIAL VIA NEBULIZER THREE TIMES DAILY AS DIRECTED 11/10/17   Laverle Hobby, MD  pantoprazole (PROTONIX) 40 MG tablet Take 1 tablet (40 mg total) by mouth daily. 10/11/19   Towanda Malkin, MD  QUEtiapine (SEROQUEL) 25 MG tablet Take 25 mg by mouth 2 (two) times daily. Takes at 2 pm and Total 75 mg a hs.    [provider]  QUEtiapine (SEROQUEL) 50 MG tablet Take 50 mg by mouth at bedtime.    [provider]  Thiamine HCl (VITAMIN B-1) 250 MG tablet Take 250 mg by mouth daily.    [provider]    Allergies Fentanyl and Penicillins  Family History  Problem Relation Age of Onset  . Cancer Mother        Stomach  . Cancer Father   . Diabetes Brother   . Cancer Brother        oral  . Healthy Son   . Healthy Sister     Social History Social History   Tobacco Use  . Smoking status: Former Smoker    Packs/day:  1.50    Years: 51.00    Pack years: 76.50    Types: Cigarettes    Quit date: 05/24/2004    Years since quitting: 15.4  . Smokeless tobacco: Never Used  . Tobacco comment: smoking cessation materials not required  Vaping Use  . Vaping Use: Never used  Substance Use Topics  . Alcohol use: Not Currently  . Drug use: No      Review of Systems Constitutional: No fever/chills Eyes: No visual changes. ENT: No sore throat. Cardiovascular: Denies chest pain. Respiratory: Denies shortness of breath. Gastrointestinal: Mild lower abdominal pain no nausea, no vomiting.  Possible diarrhea versus constipation Genitourinary: Pelvic pain, dysuria Musculoskeletal: Negative for back pain. Skin: Negative for rash. Neurological: Negative for headaches, focal weakness or numbness. All other ROS negative ____________________________________________   PHYSICAL EXAM:  VITAL SIGNS: ED Triage Vitals  Enc Vitals Group     BP 11/09/19 1643 106/80     Pulse Rate 11/09/19 1643 78     Resp 11/09/19 1643 18     Temp 11/09/19 1643 98.1 F (36.7 C)     Temp Source 11/09/19 1643 Oral     SpO2 11/09/19 1643 90 %     Weight 11/09/19 1646 130 lb (59 kg)     Height 11/09/19 1646 5\' 2"  (1.575 m)     Head Circumference --      Peak Flow --      Pain Score 11/09/19 1643 5     Pain Loc --      Pain Edu? --      Excl. in [place]? --     Constitutional: Alert and oriented. Well appearing and in no acute distress. Eyes: Conjunctivae are normal. EOMI. Head: Atraumatic. Nose: No congestion/rhinnorhea. Mouth/Throat: Mucous membranes are moist.   Neck: No stridor. Trachea Midline. FROM Cardiovascular: Normal rate, regular rhythm. Grossly normal heart sounds.  Good peripheral circulation. Respiratory: Normal respiratory effort.  No retractions. Lungs CTAB. Gastrointestinal: Soft and nontender. No distention. No abdominal bruits.  Musculoskeletal: No lower extremity tenderness nor edema.  No joint  effusions. Neurologic:  Normal speech and language. No gross focal neurologic deficits are appreciated.  Skin:  Skin is warm, dry and intact. No rash noted. Psychiatric: Mood and affect are normal. Speech and behavior are normal. GU: Patient had softball  sized amount of brown stool in her rectal vault  ____________________________________________   LABS (all labs ordered are listed, but only abnormal results are displayed)  Labs Reviewed  COMPREHENSIVE METABOLIC PANEL - Abnormal; Notable for the following components:      Result Value   Potassium 5.2 (*)    Glucose, Bld 173 (*)    BUN 28 (*)    Creatinine, Ser 1.75 (*)    Calcium 8.6 (*)    Albumin 3.4 (*)    Alkaline Phosphatase 141 (*)    GFR calc non Af Amer 27 (*)    GFR calc Af Amer 31 (*)    All other components within normal limits  CBC - Abnormal; Notable for the following components:   WBC 13.3 (*)    Hemoglobin 11.7 (*)    HCT 35.3 (*)    RDW 17.2 (*)    All other components within normal limits  URINALYSIS, COMPLETE (UACMP) WITH MICROSCOPIC - Abnormal; Notable for the following components:   Color, Urine YELLOW (*)    APPearance HAZY (*)    Hgb urine dipstick LARGE (*)    Protein, ur 100 (*)    RBC / HPF >50 (*)    Bacteria, UA RARE (*)    All other components within normal limits  URINE CULTURE  LIPASE, BLOOD   ____________________________________________  RADIOLOGY  Official radiology report(s): CT Renal Stone Study  Result Date: 11/09/2019 CLINICAL DATA:  Flank pain and hematuria EXAM: CT ABDOMEN AND PELVIS WITHOUT CONTRAST TECHNIQUE: Multidetector CT imaging of the abdomen and pelvis was performed following the standard protocol without IV contrast. COMPARISON:  None. FINDINGS: Lower chest: The visualized heart size within normal limits. No pericardial fluid/thickening. No hiatal hernia. Area of rounded atelectasis seen at the posterior right base. Hepatobiliary: Although limited due to the lack of  intravenous contrast, normal in appearance without gross focal abnormality. The patient is status post cholecystectomy. No biliary ductal dilation. Pancreas:  Unremarkable.  No surrounding inflammatory changes. Spleen: Normal in size. Although limited due to the lack of intravenous contrast, normal in appearance. Adrenals/Urinary Tract: Both adrenal glands appear normal. The patient is status post adrenal nephrectomy. There are multiple low-density lesions seen within the right kidney the largest partially exophytic measuring 9.7 cm. There is a slightly hyperdense lesion seen within the midpole measuring 1.1 cm series 2, image 38. There is a punctate 2 mm calculus seen right kidney. There is right-sided perinephric stranding present. There is diffuse superior bladder thickening with stranding changes. Stomach/Bowel: The stomach, small bowel, and colon are normal in appearance. No inflammatory changes or obstructive findings. Scattered diverticula is seen without diverticulitis. Vascular/Lymphatic: There are no enlarged abdominal or pelvic lymph nodes. There is a bi-iliac stent graft seen. Scattered aortic atherosclerotic calcifications are seen without aneurysmal dilatation. Reproductive: The patient is status post hysterectomy. No adnexal masses or collections seen. Other: No evidence of abdominal wall mass or hernia. Musculoskeletal: No acute or significant osseous findings. IMPRESSION: 1. Mild right periureteral stranding with inflammatory changes around the superior bladder which could be due to cystitis and ureteritis. 2. Nonobstructing right renal calculus 3. Multiple right renal cysts, several which appear to be slightly hyperdense which could represent proteinaceous/hemorrhagic cyst, unchanged since prior PET-CT February 28, 2019. 4.  Aortic Atherosclerosis (ICD10-I70.0). Electronically Signed   By: Prudencio Pair M.D.   On: 11/09/2019 22:52     ____________________________________________   PROCEDURES  Procedure(s) performed (including Critical Care):  Procedures  ------------------------------------------------------------------------------------------------------------------- Fecal Disimpaction Procedure  Note:  Performed by me:  Patient placed in the lateral recumbent position with knees drawn towards chest. Nurse present for patient support. Large amount of hard brown stool removed. No complications during procedure.   ------------------------------------------------------------------------------------------------------------------   ____________________________________________   INITIAL IMPRESSION / ASSESSMENT AND PLAN / ED COURSE  TYNEISHA HEGEMAN was evaluated in Emergency Department on 11/09/2019 for the symptoms described in the history of present illness. She was evaluated in the context of the global COVID-19 pandemic, which necessitated consideration that the patient might be at risk for infection with the SARS-CoV-2 virus that causes COVID-19. Institutional protocols and algorithms that pertain to the evaluation of patients at risk for COVID-19 are in a state of rapid change based on information released by regulatory bodies including the CDC and federal and state organizations. These policies and algorithms were followed during the patient's care in the ED.    Patient is an 84 year old who comes in with some pelvic pressure and concern for may be some diarrhea and some lower abdominal discomfort.  Rectal exam shows a large amount of stool that was removed.  Will get repeat urine given patient does report a little bit of dysuria as well.  Straight cath was used to obtain urine.  Labs are ordered evaluate Electra abnormalities, AKI.   Patient's labs are at baseline.  Her white count is slightly elevated but otherwise does not have any other sirs criteria.  She is chronically on oxygen for COPD per the  husband.  Patient's urine still looks consistent with UTI and does have greater than 50 RBCs in it.  I viewed the urine specimen from Dr. Juleen China office there was no RBCs in it.  Given she is a solitary kidney I think would be best to proceed with a CT renal just to make sure there is no signs of kidney stone given she is not the best historian.  CT scan does show concerns for some cystitis and some unchanged renal cyst however no evidence of kidney stone.  Discussed with family that I cannot find a culture done from Dr. Assunta Gambles office.  I am nervous about switching to another antibiotic given she is only been on this for 3 days.  I recommended that they continue to take the antibiotic and follow-up with a urine culture done today with her primary care doctor.  If she develops fevers or worsening symptoms she can return to the ER more immediately for IV antibiotics but at this time they can follow-up with the culture to see if the antibiotic needs to be switched.  After the stool was removed from the rectal vault patient stated all of her pain went away.  I suspect that her diarrhea was just secondary to this fecal impaction.  We discussed a regimen for constipation.  This time family is requesting to be discharged home and they feel comfortable with that plan  I discussed the provisional nature of ED diagnosis, the treatment so far, the ongoing plan of care, follow up appointments and return precautions with the patient and any family or support people present. They expressed understanding and agreed with the plan, discharged home.   ____________________________________________   FINAL CLINICAL IMPRESSION(S) / ED DIAGNOSES   Final diagnoses:  Cystitis  Constipation, unspecified constipation type      MEDICATIONS GIVEN DURING THIS VISIT:  Medications - No data to display   ED Discharge Orders    None       Note:  This document was prepared  using Systems analyst  and may include unintentional dictation errors.   Vanessa Buda, MD 11/09/19 [phone]

## 2019-11-09 NOTE — Chronic Care Management (AMB) (Signed)
Chronic Care Management    Clinical Social Work Follow Up Note  11/09/2019 Name: Amy [name] MRN: [id number] DOB: 05/22/1936  Amy [name] is a 84 y.o. year old female who is a primary care patient of Towanda Malkin, MD. The CCM team was consulted for assistance with Level of Care Concerns.   Review of patient status, including review of consultants reports, other relevant assessments, and collaboration with appropriate care team members and the patient's provider was performed as part of comprehensive patient evaluation and provision of chronic care management services.    SDOH (Social Determinants of Health) assessments performed: No    Outpatient Encounter Medications as of 11/09/2019  Medication Sig  . Accu-Chek Softclix Lancets lancets Use as instructed  . acetaminophen (TYLENOL) 325 MG tablet Take 325 mg by mouth every 6 (six) hours as needed.  [name] albuterol (VENTOLIN HFA) 108 (90 Base) MCG/ACT inhaler Inhale 1-2 puffs into the lungs every 4 (four) hours as needed.   [name] atorvastatin (LIPITOR) 20 MG tablet Take 1 tablet (20 mg total) by mouth daily at 6 PM.  . clopidogrel (PLAVIX) 75 MG tablet TAKE 1 TABLET EVERY DAY  . Cyanocobalamin (VITAMIN B12 PO) Take by mouth.  . donepezil (ARICEPT) 10 MG tablet Take 1 tablet (10 mg total) by mouth at bedtime.  . famotidine (PEPCID) 20 MG tablet Take 1 tablet (20 mg total) by mouth daily.  . ferrous sulfate 325 (65 FE) MG tablet Take 325 mg by mouth daily with breakfast. Pt taking every other day  . FLUoxetine (PROZAC) 10 MG capsule Take 1 capsule (10 mg total) by mouth daily.  [name] gabapentin (NEURONTIN) 300 MG capsule Take 1 capsule (300 mg total) by mouth 3 (three) times daily.  [name] HYDROcodone-acetaminophen (NORCO) 7.5-325 MG tablet Take 1 tablet by mouth every 8 (eight) hours as needed for moderate pain.  [name] ON 11/26/2019] HYDROcodone-acetaminophen (NORCO) 7.5-325 MG tablet Take 1 tablet by mouth every 8 (eight) hours as  needed for moderate pain.  [name] ON 12/26/2019] HYDROcodone-acetaminophen (NORCO) 7.5-325 MG tablet Take 1 tablet by mouth every 8 (eight) hours as needed for moderate pain.  [name] ipratropium-albuterol (DUONEB) 0.5-2.5 (3) MG/3ML SOLN INHALE THE CONTENTS OF 1 VIAL VIA NEBULIZER THREE TIMES DAILY AS DIRECTED  . pantoprazole (PROTONIX) 40 MG tablet Take 1 tablet (40 mg total) by mouth daily.  . QUEtiapine (SEROQUEL) 25 MG tablet Take 25 mg by mouth 2 (two) times daily. Takes at 2 pm and Total 75 mg a hs.  . QUEtiapine (SEROQUEL) 50 MG tablet Take 50 mg by mouth at bedtime.  . Thiamine HCl (VITAMIN B-1) 250 MG tablet Take 250 mg by mouth daily.   No facility-administered encounter medications on file as of 11/09/2019.     Goals Addressed              This Visit's Progress   .  Per patient's spouse "I think I need to look in to long term care for my wife" (pt-stated)        CARE PLAN ENTRY (see longitudinal plan of care for additional care plan information)  Current Barriers:  . Level of care concerns  Clinical Social Work Clinical Goal(s):  [name] Over the next 90 days, patient's spouse will work with SW to address concerns related to patient's level of care needs  Interventions:  . Patient's spouse now having second thoughts about patient being admitted to [facility] and wanting this social worker to look into [employer]  [facility] and [facility] . [place] home visit scheduled for Monday, 11/12/19 at 11am . Phone call to  [place] at [facility], contact information provided so that patient's spouse can be contacted  . Processed patient's spouse's concerns and expectations regarding bed at [facility] reality testing provided   Patient Self Care Activities:  . Attends all scheduled provider appointments . Unable to perform ADLs independently . Unable to perform IADLs independently . Knowledge deficit of the out of home placement process  Please see past updates  related to this goal by clicking on the "Past Updates" button in the selected goal          Follow Up Plan: SW will follow up with patient by phone over the next 7-10 business days    Elliot Gurney, [place] Worker  [facility] Care Management [phone]

## 2019-11-10 NOTE — ED Notes (Signed)
Reviewed discharge instructions, follow-up care, and prescriptions with patient. Patient verbalized understanding of all information reviewed. Patient stable, with no distress noted at this time.    

## 2019-11-11 LAB — URINE CULTURE: Culture: NO GROWTH

## 2019-11-12 ENCOUNTER — Ambulatory Visit: Payer: Self-pay | Admitting: *Deleted

## 2019-11-12 DIAGNOSIS — G301 Alzheimer's disease with late onset: Secondary | ICD-10-CM

## 2019-11-12 DIAGNOSIS — I10 Essential (primary) hypertension: Secondary | ICD-10-CM

## 2019-11-12 NOTE — Patient Instructions (Signed)
Thank you allowing the Chronic Care Management Team to be a part of your care! It was a pleasure speaking with you today!  1. Please visit [facility] to determine placement selction  CCM (Chronic Care Management) Team   Neldon Labella RN, BSN Nurse Care Coordinator  [phone]  [place], [place] Social Worker [phone]  Goals Addressed              This Visit's Progress   .  Per patient's spouse "I think I need to look in to long term care for my wife" (pt-stated)        CARE PLAN ENTRY (see longitudinal plan of care for additional care plan information)  Current Barriers:  . Level of care concerns  Clinical Social Work Clinical Goal(s):  [name] Over the next 90 days, patient's spouse will work with SW to address concerns related to patient's level of care needs  Interventions:  . Patient's spouse confirmed that [facility] is currently at his home interviewing his spouse for placement . Patient's spouse confirms that he is waiting on [place] to return his call regarding a bed offer . Encouraged patient's spouse  to visit both [facility] and [facility] in order to make a decision regarding placement   Patient Self Care Activities:  . Attends all scheduled provider appointments . Unable to perform ADLs independently . Unable to perform IADLs independently . Knowledge deficit of the out of home placement process  Please see past updates related to this goal by clicking on the "Past Updates" button in the selected goal          The patient verbalized understanding of instructions provided today and declined a print copy of patient instruction materials.   Telephone follow up appointment with care management team member scheduled for: 11/13/19

## 2019-11-12 NOTE — Chronic Care Management (AMB) (Signed)
Chronic Care Management    Clinical Social Work Follow Up Note  11/12/2019 Name: Amy [name] MRN: [id number] DOB: [birth year]  Amy [name] is a 84 y.o. year old female who is a primary care patient of Towanda Malkin, MD. The CCM team was consulted for assistance with Level of Care Concerns.   Review of patient status, including review of consultants reports, other relevant assessments, and collaboration with appropriate care team members and the patient's provider was performed as part of comprehensive patient evaluation and provision of chronic care management services.    SDOH (Social Determinants of Health) assessments performed: No    Outpatient Encounter Medications as of 11/12/2019  Medication Sig  . Accu-Chek Softclix Lancets lancets Use as instructed  . acetaminophen (TYLENOL) 325 MG tablet Take 325 mg by mouth every 6 (six) hours as needed.  [name] albuterol (VENTOLIN HFA) 108 (90 Base) MCG/ACT inhaler Inhale 1-2 puffs into the lungs every 4 (four) hours as needed.   [name] atorvastatin (LIPITOR) 20 MG tablet Take 1 tablet (20 mg total) by mouth daily at 6 PM.  . clopidogrel (PLAVIX) 75 MG tablet TAKE 1 TABLET EVERY DAY  . Cyanocobalamin (VITAMIN B12 PO) Take by mouth.  . donepezil (ARICEPT) 10 MG tablet Take 1 tablet (10 mg total) by mouth at bedtime.  . famotidine (PEPCID) 20 MG tablet Take 1 tablet (20 mg total) by mouth daily.  . ferrous sulfate 325 (65 FE) MG tablet Take 325 mg by mouth daily with breakfast. Pt taking every other day  . FLUoxetine (PROZAC) 10 MG capsule Take 1 capsule (10 mg total) by mouth daily.  [name] gabapentin (NEURONTIN) 300 MG capsule Take 1 capsule (300 mg total) by mouth 3 (three) times daily.  [name] HYDROcodone-acetaminophen (NORCO) 7.5-325 MG tablet Take 1 tablet by mouth every 8 (eight) hours as needed for moderate pain.  [name] ON 11/26/2019] HYDROcodone-acetaminophen (NORCO) 7.5-325 MG tablet Take 1 tablet by mouth every 8 (eight) hours as  needed for moderate pain.  [name] ON 12/26/2019] HYDROcodone-acetaminophen (NORCO) 7.5-325 MG tablet Take 1 tablet by mouth every 8 (eight) hours as needed for moderate pain.  [name] ipratropium-albuterol (DUONEB) 0.5-2.5 (3) MG/3ML SOLN INHALE THE CONTENTS OF 1 VIAL VIA NEBULIZER THREE TIMES DAILY AS DIRECTED  . pantoprazole (PROTONIX) 40 MG tablet Take 1 tablet (40 mg total) by mouth daily.  . QUEtiapine (SEROQUEL) 25 MG tablet Take 25 mg by mouth 2 (two) times daily. Takes at 2 pm and Total 75 mg a hs.  . QUEtiapine (SEROQUEL) 50 MG tablet Take 50 mg by mouth at bedtime.  . Thiamine HCl (VITAMIN B-1) 250 MG tablet Take 250 mg by mouth daily.   No facility-administered encounter medications on file as of 11/12/2019.     Goals Addressed              This Visit's Progress   .  Per patient's spouse "I think I need to look in to long term care for my wife" (pt-stated)        CARE PLAN ENTRY (see longitudinal plan of care for additional care plan information)  Current Barriers:  . Level of care concerns  Clinical Social Work Clinical Goal(s):  [name] Over the next 90 days, patient's spouse will work with SW to address concerns related to patient's level of care needs  Interventions:  . Patient's spouse confirmed that [facility] is currently at his home interviewing his spouse for placement . Patient's spouse confirms that  he is waiting on [place] to return his call regarding a bed offer . Encouraged patient's spouse  to visit both [facility] and [facility] in order to make a decision regarding placement   Patient Self Care Activities:  . Attends all scheduled provider appointments . Unable to perform ADLs independently . Unable to perform IADLs independently . Knowledge deficit of the out of home placement process  Please see past updates related to this goal by clicking on the "Past Updates" button in the selected goal          Follow Up Plan: SW will  follow up with patient by phone over the next 7-10 days regarding placement selection   Burnice Oestreicher, [place] Worker  [facility] Care Management [phone]

## 2019-11-13 ENCOUNTER — Ambulatory Visit: Payer: Self-pay | Admitting: *Deleted

## 2019-11-13 DIAGNOSIS — I10 Essential (primary) hypertension: Secondary | ICD-10-CM

## 2019-11-13 DIAGNOSIS — F02818 Dementia in other diseases classified elsewhere, unspecified severity, with other behavioral disturbance: Secondary | ICD-10-CM

## 2019-11-13 DIAGNOSIS — G301 Alzheimer's disease with late onset: Secondary | ICD-10-CM

## 2019-11-13 NOTE — Patient Instructions (Signed)
Thank you allowing the Chronic Care Management Team to be a part of your care! It was a pleasure speaking with you today!  1. Please expect [facility]ib[facility] Billings [facility] to visit to complete COVID test for placement purposes  CCM (Chronic Care Management) Team   Neldon Labella RN, BSN Nurse Care Coordinator  [phone]  [place], LCSW Clinical Social Worker [phone]  Goals Addressed              This Visit's Progress     Per patient's spouse "I think I need to look in to long term care for my wife" (pt-stated)        CARE PLAN ENTRY (see longitudinal plan of care for additional care plan information)  Current Barriers:   Level of care concerns  Clinical Social Work Clinical Goal(s):   Over the next 90 days, patient's spouse will work with SW to address concerns related to patient's level of care needs  Interventions:   Patient's spouse confirmed that he has accepted bed offer at [facility] for Patient  Patient expected to transition to [facility] on Friday  Collaboration phone call to [place] at [employer] to confirm patient's bed offer on Friday  Collaboration phone call to the [place] with Claiborne Billings who agreed to complete COVID test for admission to [facility] on 11/14/19 at 8:30 am  Emotional support provided to patient's spouse regarding placement decision   Patient Self Care Activities:   Attends all scheduled provider appointments  Unable to perform ADLs independently  Unable to perform IADLs independently  Knowledge deficit of the out of home placement process  Please see past updates related to this goal by clicking on the "Past Updates" button in the selected goal          The patient verbalized understanding of instructions provided today and declined a print copy of patient instruction materials.   Telephone follow up appointment with care management team member scheduled for: 11/21/19

## 2019-11-13 NOTE — Chronic Care Management (AMB) (Signed)
Chronic Care Management    Clinical Social Work Follow Up Note  11/13/2019 Name: Amy [name] MRN: [id number] DOB: 12-21-1935  Amy [name] is a 84 y.o. year old female who is a primary care patient of Towanda Malkin, MD. The CCM team was consulted for assistance with Level of Care Concerns.   Review of patient status, including review of consultants reports, other relevant assessments, and collaboration with appropriate care team members and the patient's provider was performed as part of comprehensive patient evaluation and provision of chronic care management services.    SDOH (Social Determinants of Health) assessments performed: No    Outpatient Encounter Medications as of 11/13/2019  Medication Sig  . Accu-Chek Softclix Lancets lancets Use as instructed  . acetaminophen (TYLENOL) 325 MG tablet Take 325 mg by mouth every 6 (six) hours as needed.  [name] albuterol (VENTOLIN HFA) 108 (90 Base) MCG/ACT inhaler Inhale 1-2 puffs into the lungs every 4 (four) hours as needed.   [name] atorvastatin (LIPITOR) 20 MG tablet Take 1 tablet (20 mg total) by mouth daily at 6 PM.  . clopidogrel (PLAVIX) 75 MG tablet TAKE 1 TABLET EVERY DAY  . Cyanocobalamin (VITAMIN B12 PO) Take by mouth.  . donepezil (ARICEPT) 10 MG tablet Take 1 tablet (10 mg total) by mouth at bedtime.  . famotidine (PEPCID) 20 MG tablet Take 1 tablet (20 mg total) by mouth daily.  . ferrous sulfate 325 (65 FE) MG tablet Take 325 mg by mouth daily with breakfast. Pt taking every other day  . FLUoxetine (PROZAC) 10 MG capsule Take 1 capsule (10 mg total) by mouth daily.  [name] gabapentin (NEURONTIN) 300 MG capsule Take 1 capsule (300 mg total) by mouth 3 (three) times daily.  [name] HYDROcodone-acetaminophen (NORCO) 7.5-325 MG tablet Take 1 tablet by mouth every 8 (eight) hours as needed for moderate pain.  [name] ON 11/26/2019] HYDROcodone-acetaminophen (NORCO) 7.5-325 MG tablet Take 1 tablet by mouth every 8 (eight) hours as  needed for moderate pain.  [name] ON 12/26/2019] HYDROcodone-acetaminophen (NORCO) 7.5-325 MG tablet Take 1 tablet by mouth every 8 (eight) hours as needed for moderate pain.  [name] ipratropium-albuterol (DUONEB) 0.5-2.5 (3) MG/3ML SOLN INHALE THE CONTENTS OF 1 VIAL VIA NEBULIZER THREE TIMES DAILY AS DIRECTED  . pantoprazole (PROTONIX) 40 MG tablet Take 1 tablet (40 mg total) by mouth daily.  . QUEtiapine (SEROQUEL) 25 MG tablet Take 25 mg by mouth 2 (two) times daily. Takes at 2 pm and Total 75 mg a hs.  . QUEtiapine (SEROQUEL) 50 MG tablet Take 50 mg by mouth at bedtime.  . Thiamine HCl (VITAMIN B-1) 250 MG tablet Take 250 mg by mouth daily.   No facility-administered encounter medications on file as of 11/13/2019.     Goals Addressed              This Visit's Progress   .  Per patient's spouse "I think I need to look in to long term care for my wife" (pt-stated)        CARE PLAN ENTRY (see longitudinal plan of care for additional care plan information)  Current Barriers:  . Level of care concerns  Clinical Social Work Clinical Goal(s):  [name] Over the next 90 days, patient's spouse will work with SW to address concerns related to patient's level of care needs  Interventions:  . Patient's spouse confirmed that he has accepted bed offer at [facility] for Patient . Patient expected to transition to [facility]  on Friday . Collaboration phone call to [place] at [employer] to confirm patient's bed offer on Friday . Collaboration phone call to [facility]-spoke with Claiborne Billings who agreed to complete COVID test for admission to [facility] on 11/14/19 at 8:30 am . Emotional support provided to patient's spouse regarding placement decision   Patient Self Care Activities:  . Attends all scheduled provider appointments . Unable to perform ADLs independently . Unable to perform IADLs independently . Knowledge deficit of the out of home placement process  Please see past  updates related to this goal by clicking on the "Past Updates" button in the selected goal          Follow Up Plan: SW will follow up with patient by phone over the next 7-10 business days   Elliot Gurney, [place] Worker  [facility] Care Management [phone]

## 2019-11-13 NOTE — Chronic Care Management (AMB) (Signed)
Chronic Care Management    Clinical Social Work Follow Up Note  11/13/2019 Name: Amy [name] MRN: [id number] DOB: 1936-04-05  Amy [name] is a 84 y.o. year old female who is a primary care patient of Towanda Malkin, MD. The CCM team was consulted for assistance with Level of Care Concerns.   Review of patient status, including review of consultants reports, other relevant assessments, and collaboration with appropriate care team members and the patient's provider was performed as part of comprehensive patient evaluation and provision of chronic care management services.    SDOH (Social Determinants of Health) assessments performed: No    Outpatient Encounter Medications as of 11/13/2019  Medication Sig  . Accu-Chek Softclix Lancets lancets Use as instructed  . acetaminophen (TYLENOL) 325 MG tablet Take 325 mg by mouth every 6 (six) hours as needed.  [name] albuterol (VENTOLIN HFA) 108 (90 Base) MCG/ACT inhaler Inhale 1-2 puffs into the lungs every 4 (four) hours as needed.   [name] atorvastatin (LIPITOR) 20 MG tablet Take 1 tablet (20 mg total) by mouth daily at 6 PM.  . clopidogrel (PLAVIX) 75 MG tablet TAKE 1 TABLET EVERY DAY  . Cyanocobalamin (VITAMIN B12 PO) Take by mouth.  . donepezil (ARICEPT) 10 MG tablet Take 1 tablet (10 mg total) by mouth at bedtime.  . famotidine (PEPCID) 20 MG tablet Take 1 tablet (20 mg total) by mouth daily.  . ferrous sulfate 325 (65 FE) MG tablet Take 325 mg by mouth daily with breakfast. Pt taking every other day  . FLUoxetine (PROZAC) 10 MG capsule Take 1 capsule (10 mg total) by mouth daily.  [name] gabapentin (NEURONTIN) 300 MG capsule Take 1 capsule (300 mg total) by mouth 3 (three) times daily.  [name] HYDROcodone-acetaminophen (NORCO) 7.5-325 MG tablet Take 1 tablet by mouth every 8 (eight) hours as needed for moderate pain.  [name] ON 11/26/2019] HYDROcodone-acetaminophen (NORCO) 7.5-325 MG tablet Take 1 tablet by mouth every 8 (eight) hours as  needed for moderate pain.  [name] ON 12/26/2019] HYDROcodone-acetaminophen (NORCO) 7.5-325 MG tablet Take 1 tablet by mouth every 8 (eight) hours as needed for moderate pain.  [name] ipratropium-albuterol (DUONEB) 0.5-2.5 (3) MG/3ML SOLN INHALE THE CONTENTS OF 1 VIAL VIA NEBULIZER THREE TIMES DAILY AS DIRECTED  . pantoprazole (PROTONIX) 40 MG tablet Take 1 tablet (40 mg total) by mouth daily.  . QUEtiapine (SEROQUEL) 25 MG tablet Take 25 mg by mouth 2 (two) times daily. Takes at 2 pm and Total 75 mg a hs.  . QUEtiapine (SEROQUEL) 50 MG tablet Take 50 mg by mouth at bedtime.  . Thiamine HCl (VITAMIN B-1) 250 MG tablet Take 250 mg by mouth daily.   No facility-administered encounter medications on file as of 11/13/2019.     Goals Addressed              This Visit's Progress   .  Per patient's spouse "I think I need to look in to long term care for my wife" (pt-stated)        CARE PLAN ENTRY (see longitudinal plan of care for additional care plan information)  Current Barriers:  . Level of care concerns  Clinical Social Work Clinical Goal(s):  [name] Over the next 90 days, patient's spouse will work with SW to address concerns related to patient's level of care needs  Interventions:  . Phone call from [facility] who confirmed that patient's spouse came in to tour the facility for placement . Claiborne Billings requested  that the FL2 be re-faxed along with any additional clinical notes   Patient Self Care Activities:  . Attends all scheduled provider appointments . Unable to perform ADLs independently . Unable to perform IADLs independently . Knowledge deficit of the out of home placement process  Please see past updates related to this goal by clicking on the "Past Updates" button in the selected goal          Follow Up Plan: SW will follow up with patient by phone over the next 7-10 business days   Elliot Gurney, [place] Worker  [facility]  Care Management [phone]

## 2019-11-14 ENCOUNTER — Ambulatory Visit: Payer: Self-pay | Admitting: *Deleted

## 2019-11-14 ENCOUNTER — Telehealth: Payer: Self-pay

## 2019-11-14 DIAGNOSIS — I10 Essential (primary) hypertension: Secondary | ICD-10-CM

## 2019-11-14 DIAGNOSIS — G301 Alzheimer's disease with late onset: Secondary | ICD-10-CM

## 2019-11-14 NOTE — Chronic Care Management (AMB) (Signed)
Chronic Care Management    Clinical Social Work Follow Up Note  11/14/2019 Name: Amy [name] MRN: [id number] DOB: [birth year]  Amy [name] is a 84 y.o. year old female who is a primary care patient of Towanda Malkin, MD. The CCM team was consulted for assistance with Level of Care Concerns.   Review of patient status, including review of consultants reports, other relevant assessments, and collaboration with appropriate care team members and the patient's provider was performed as part of comprehensive patient evaluation and provision of chronic care management services.    SDOH (Social Determinants of Health) assessments performed: No    Outpatient Encounter Medications as of 11/14/2019  Medication Sig  . Accu-Chek Softclix Lancets lancets Use as instructed  . acetaminophen (TYLENOL) 325 MG tablet Take 325 mg by mouth every 6 (six) hours as needed.  [name] albuterol (VENTOLIN HFA) 108 (90 Base) MCG/ACT inhaler Inhale 1-2 puffs into the lungs every 4 (four) hours as needed.   [name] atorvastatin (LIPITOR) 20 MG tablet Take 1 tablet (20 mg total) by mouth daily at 6 PM.  . clopidogrel (PLAVIX) 75 MG tablet TAKE 1 TABLET EVERY DAY  . Cyanocobalamin (VITAMIN B12 PO) Take by mouth.  . donepezil (ARICEPT) 10 MG tablet Take 1 tablet (10 mg total) by mouth at bedtime.  . famotidine (PEPCID) 20 MG tablet Take 1 tablet (20 mg total) by mouth daily.  . ferrous sulfate 325 (65 FE) MG tablet Take 325 mg by mouth daily with breakfast. Pt taking every other day  . FLUoxetine (PROZAC) 10 MG capsule Take 1 capsule (10 mg total) by mouth daily.  [name] gabapentin (NEURONTIN) 300 MG capsule Take 1 capsule (300 mg total) by mouth 3 (three) times daily.  [name] HYDROcodone-acetaminophen (NORCO) 7.5-325 MG tablet Take 1 tablet by mouth every 8 (eight) hours as needed for moderate pain.  [name] ON 11/26/2019] HYDROcodone-acetaminophen (NORCO) 7.5-325 MG tablet Take 1 tablet by mouth every 8 (eight) hours as  needed for moderate pain.  [name] ON 12/26/2019] HYDROcodone-acetaminophen (NORCO) 7.5-325 MG tablet Take 1 tablet by mouth every 8 (eight) hours as needed for moderate pain.  [name] ipratropium-albuterol (DUONEB) 0.5-2.5 (3) MG/3ML SOLN INHALE THE CONTENTS OF 1 VIAL VIA NEBULIZER THREE TIMES DAILY AS DIRECTED  . pantoprazole (PROTONIX) 40 MG tablet Take 1 tablet (40 mg total) by mouth daily.  . QUEtiapine (SEROQUEL) 25 MG tablet Take 25 mg by mouth 2 (two) times daily. Takes at 2 pm and Total 75 mg a hs.  . QUEtiapine (SEROQUEL) 50 MG tablet Take 50 mg by mouth at bedtime.  . Thiamine HCl (VITAMIN B-1) 250 MG tablet Take 250 mg by mouth daily.   No facility-administered encounter medications on file as of 11/14/2019.     Goals Addressed              This Visit's Progress   .  Per patient's spouse "I think I need to look in to long term care for my wife" (pt-stated)        CARE PLAN ENTRY (see longitudinal plan of care for additional care plan information)  Current Barriers:  . Level of care concerns  Clinical Social Work Clinical Goal(s):  [name] Over the next 90 days, patient's spouse will work with SW to address concerns related to patient's level of care needs  Interventions:  . Patient's spouse confirmed that patient has been COVID tested by the health Department this morning. Results to be forwarded to [facility] .  Patient expected to transition to [facility] on Friday . Emotional support continued to be provided to patient's spouse regarding placement decision   Patient Self Care Activities:  . Attends all scheduled provider appointments . Unable to perform ADLs independently . Unable to perform IADLs independently . Knowledge deficit of the out of home placement process  Please see past updates related to this goal by clicking on the "Past Updates" button in the selected goal          Follow Up Plan: SW will follow up with patient by phone over the next 7-10  regarding placement process   [place], [place] Worker  [facility] Care Management [phone]

## 2019-11-14 NOTE — Patient Instructions (Signed)
Thank you allowing the Chronic Care Management Team to be a part of your care! It was a pleasure speaking with you today!  1. Please follow up with [facility] regarding COVID test results and placement on Friday  CCM (Chronic Care Management) Team   Neldon Labella RN, BSN Nurse Care Coordinator  [phone]  Ruben Reason PharmD  Clinical Pharmacist  [phone]   [place], LCSW Clinical Social Worker [phone]  Goals Addressed              This Visit's Progress     Per patient's spouse "I think I need to look in to long term care for my wife" (pt-stated)        CARE PLAN ENTRY (see longitudinal plan of care for additional care plan information)  Current Barriers:   Level of care concerns  Clinical Social Work Clinical Goal(s):   Over the next 90 days, patient's spouse will work with SW to address concerns related to patient's level of care needs  Interventions:   Patient's spouse confirmed that patient has been COVID tested by the health Department this morning. Results to be forwarded to [facility]  Patient expected to transition to [facility] on Friday  Emotional support continued to be provided to patient's spouse regarding placement decision   Patient Self Care Activities:   Attends all scheduled provider appointments  Unable to perform ADLs independently  Unable to perform IADLs independently  Knowledge deficit of the out of home placement process  Please see past updates related to this goal by clicking on the "Past Updates" button in the selected goal          The patient verbalized understanding of instructions provided today and declined a print copy of patient instruction materials.   Telephone follow up appointment with care management team member scheduled for: 11/21/19

## 2019-11-15 ENCOUNTER — Encounter: Payer: Self-pay | Admitting: *Deleted

## 2019-11-15 ENCOUNTER — Telehealth: Payer: Self-pay

## 2019-11-15 ENCOUNTER — Ambulatory Visit: Payer: Self-pay | Admitting: *Deleted

## 2019-11-15 DIAGNOSIS — G301 Alzheimer's disease with late onset: Secondary | ICD-10-CM

## 2019-11-15 DIAGNOSIS — I10 Essential (primary) hypertension: Secondary | ICD-10-CM

## 2019-11-15 NOTE — Progress Notes (Signed)
This encounter was created in error - please disregard.

## 2019-11-15 NOTE — Chronic Care Management (AMB) (Signed)
Chronic Care Management    Clinical Social Work Follow Up Note  11/15/2019 Name: FAUSTINE TATES MRN: [id number] DOB: [birth year]  MILANY GECK is a 84 y.o. year old female who is a primary care patient of Towanda Malkin, MD. The CCM team was consulted for assistance with Level of Care Concerns.   Review of patient status, including review of consultants reports, other relevant assessments, and collaboration with appropriate care team members and the patient's provider was performed as part of comprehensive patient evaluation and provision of chronic care management services.    SDOH (Social Determinants of Health) assessments performed: No    Outpatient Encounter Medications as of 11/15/2019  Medication Sig  . Accu-Chek Softclix Lancets lancets Use as instructed  . acetaminophen (TYLENOL) 325 MG tablet Take 325 mg by mouth every 6 (six) hours as needed.  [name] albuterol (VENTOLIN HFA) 108 (90 Base) MCG/ACT inhaler Inhale 1-2 puffs into the lungs every 4 (four) hours as needed.   [name] atorvastatin (LIPITOR) 20 MG tablet Take 1 tablet (20 mg total) by mouth daily at 6 PM.  . clopidogrel (PLAVIX) 75 MG tablet TAKE 1 TABLET EVERY DAY  . Cyanocobalamin (VITAMIN B12 PO) Take by mouth.  . donepezil (ARICEPT) 10 MG tablet Take 1 tablet (10 mg total) by mouth at bedtime.  . famotidine (PEPCID) 20 MG tablet Take 1 tablet (20 mg total) by mouth daily.  . ferrous sulfate 325 (65 FE) MG tablet Take 325 mg by mouth daily with breakfast. Pt taking every other day  . FLUoxetine (PROZAC) 10 MG capsule Take 1 capsule (10 mg total) by mouth daily.  [name] gabapentin (NEURONTIN) 300 MG capsule Take 1 capsule (300 mg total) by mouth 3 (three) times daily.  [name] HYDROcodone-acetaminophen (NORCO) 7.5-325 MG tablet Take 1 tablet by mouth every 8 (eight) hours as needed for moderate pain.  [name] ON 11/26/2019] HYDROcodone-acetaminophen (NORCO) 7.5-325 MG tablet Take 1 tablet by mouth every 8 (eight) hours as  needed for moderate pain.  [name] ON 12/26/2019] HYDROcodone-acetaminophen (NORCO) 7.5-325 MG tablet Take 1 tablet by mouth every 8 (eight) hours as needed for moderate pain.  [name] ipratropium-albuterol (DUONEB) 0.5-2.5 (3) MG/3ML SOLN INHALE THE CONTENTS OF 1 VIAL VIA NEBULIZER THREE TIMES DAILY AS DIRECTED  . pantoprazole (PROTONIX) 40 MG tablet Take 1 tablet (40 mg total) by mouth daily.  . QUEtiapine (SEROQUEL) 25 MG tablet Take 25 mg by mouth 2 (two) times daily. Takes at 2 pm and Total 75 mg a hs.  . QUEtiapine (SEROQUEL) 50 MG tablet Take 50 mg by mouth at bedtime.  . Thiamine HCl (VITAMIN B-1) 250 MG tablet Take 250 mg by mouth daily.   No facility-administered encounter medications on file as of 11/15/2019.     Goals Addressed              This Visit's Progress   .  Per patient's spouse "I think I need to look in to long term care for my wife" (pt-stated)        CARE PLAN ENTRY (see longitudinal plan of care for additional care plan information)  Current Barriers:  . Level of care concerns  Clinical Social Work Clinical Goal(s):  [name] Over the next 90 days, patient's spouse will work with SW to address concerns related to patient's level of care needs  Interventions:  . Phone call from [facility]-Chris-admissions director to confirm patient's transition there tomorrow at 11:30am . Fl2, updated medication list and last MD  note requested and provided for admission . Confirmed that COVID test results have been received   Patient Self Care Activities:  . Attends all scheduled provider appointments . Unable to perform ADLs independently . Unable to perform IADLs independently . Knowledge deficit of the out of home placement process  Please see past updates related to this goal by clicking on the "Past Updates" button in the selected goal          Follow Up Plan: SW will follow up with patient by phone over the next 5-7 business days   Elliot Gurney, [place] Worker  [facility] [phone]

## 2019-11-15 NOTE — Telephone Encounter (Signed)
I spoke with Amy [name] and he informed me that he got everything taken care of with Amy [name].   Copied from [place] [phone]. Topic: Quick Communication - See Telephone Encounter >> Nov 15, 2019  8:50 AM Amy [name] wrote: CRM for notification. See Telephone encounter for: 11/15/19. Amy [name] calling for specific info re to pt med update, recent changes etc. [place] had coordinated an appt today with [facility] to see this pt and he is wanting to gather info in re to this appt.  Contact Chris at [phone] at ext [phone]

## 2019-11-19 ENCOUNTER — Telehealth: Payer: Self-pay | Admitting: Primary Care

## 2019-11-19 NOTE — Telephone Encounter (Signed)
Patient is in [facility] and may be placed in LTC. Call out to family for care planning discussion.

## 2019-11-20 DIAGNOSIS — F418 Other specified anxiety disorders: Secondary | ICD-10-CM | POA: Diagnosis not present

## 2019-11-20 DIAGNOSIS — E785 Hyperlipidemia, unspecified: Secondary | ICD-10-CM | POA: Diagnosis not present

## 2019-11-20 DIAGNOSIS — G8929 Other chronic pain: Secondary | ICD-10-CM | POA: Diagnosis not present

## 2019-11-20 DIAGNOSIS — I739 Peripheral vascular disease, unspecified: Secondary | ICD-10-CM | POA: Diagnosis not present

## 2019-11-20 DIAGNOSIS — K219 Gastro-esophageal reflux disease without esophagitis: Secondary | ICD-10-CM | POA: Diagnosis not present

## 2019-11-20 DIAGNOSIS — I10 Essential (primary) hypertension: Secondary | ICD-10-CM | POA: Diagnosis not present

## 2019-11-20 DIAGNOSIS — F0390 Unspecified dementia without behavioral disturbance: Secondary | ICD-10-CM | POA: Diagnosis not present

## 2019-11-20 NOTE — Progress Notes (Signed)
Askari, Saron L. ([id number]) Visit Report for 10/03/2019 HPI Details Patient Name: Amy [name], Amy [name]. Date of Service: 10/03/2019 8:30 AM Medical Record Number: [id number] Patient Account Number: [id number] Date of Birth/Sex: [birth year] (84 y.o. F) Treating RN: Cornell Barman Primary Care Provider: HENDRICKSON, CLIFFORD Other Clinician: Referring Provider: Raelyn Ensign Treating Provider/Extender: Ricard Dillon Weeks in Treatment: 3 History of Present Illness Associated Signs and Symptoms: The patient has a history of chronic kidney disease, diabetes mellitus type II although her hemoglobin A1c most recent was 5.8 and she is on the medications. 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 She is incontinent. HPI Description: 03/10/18 on evaluation today patient actually presents for evaluation concerning her bilateral gluteal ulcers which have been present for several weeks. She has been treated over time with different creams most recently polysporin. With that being said the patient actually seems to be doing somewhat better at this point in time during evaluation today. Nonetheless she still does have an opening mainly on the left side the right pretty much appears to be closed in my opinion. She does have pain on the left gluteal region. She also tells me about an area under the abdominal fold in the lower abdominal region which appears to be new just yesterday or today. This appears to potentially be a little bit of a fungal rash versus possibly friction from the way her husband has to help her with transfer. No fevers, chills, nausea, or vomiting noted at this time. Patient has dementia and has a very difficult time sometimes following directions. Her husband does the best he can taking care for home but he states is difficult sometimes to get her to do  anything but just sit either in her wheelchair or the recliner which is probably why she's having such issues with her gluteal region. Nonetheless considering how much time she seems to spend sitting I'm pleased that the wounds are not any worse in appearance. 03/17/18 on evaluation today patient actually appears to be completely healed in regard to her ulcers on her gluteal region. She has been tolerating the dressing changes without complication. Her abdominal area also appears to be doing better there is no opening at the site either she continues to use the cream that was prescribed at the last visit. That is the next one to one of zinc oxide and nystatin cream. Overall this seems to be doing very well at this point in time. Readmission: 04/09/2019 on evaluation today patient appears to be doing somewhat more poorly. This is a patient whom I saw almost a year ago. She has had some discoloration in her gluteal region in general since that time. Again I think this is always seemingly due to excessive pressure to the area. She does have her husband with her who is actually her primary caregiver and does a very good job with her in my opinion. With that being said he obviously is somewhat frustrated in general with the fact that this keeps staying open he is also frustrated with the fact that she seems to not really want to stay in the bed to offload and lay on her side she likes to lay on her back he is having a very hard time finding a way to help her keep pressure off of the region. She does have dementia and this is playing a big role as well.  Subsequently he did try to get her a different mattress to go underneath her chair in order to help with offloading but she would not even give it a try. Subsequently he did try donut as well but that just she seems to be moving. He really cannot keep that in the right place. I suggested to him that a piece of memory foam may be better as far as not having  to worry about exact positioning this would help with giving additional pressure offloading support. 04/16/2019 on evaluation today patient actually appears to be completely healed based on what I am seeing. Unfortunately she did not like the [facility] along with a border foam dressing that we ordered at all. She complained that it hurt as soon as she sat on it and she did not want to wear it. Subsequently they ended up discontinuing that and have just been using the Desitin at this point. He did actually have his son help him to put a memory foam mattress on the bed and he also had several pieces he was able to cut to make memory foam cushions for her chair and that has seemed to make a dramatic improvement with regard to the pressure to her gluteal region. With that being said the patient also had been complaining of back pain pretty much every morning and also has not been complaining of this either. Overall he is actually very pleased with how things are doing with the memory foam. READMISSION 09/12/2019 This is a now 84 year old woman we have seen in clinic on 2 separate occasions but I do not believe I have seen her in the past. She was here in 2018 and again in 2020 for gluteal wounds which were felt to be pressure. She has dementia when she sits and lies on her bed on her buttocks and according to her husband there is just no way to get her off this. Complicating the issue is she has urinary incontinence and although her husband attempts to change this frequently he often finds the area wet with urine. The patient is nonambulatory. She is in a wheelchair or lying on her back in bed. She does have memory foam over the mattress in the wheelchair. She is here today as she is felt to have new wounds. Our intake and nurse initially did not notice anything but some excoriation on the skin of her buttocks around the gluteal cleft. Past medical history includes type 2 diabetes, left AKA, urinary  incontinence, prior history of left nephrectomy for malignancy, dementia, hypertension, history of PAD. She has been on oxygen for 2 L for about a year. I am not sure what the primary diagnosis is. 5/5; 2-week follow-up. This is a patient with a very narrow linear wound in the lower sacrum coccyx area. She has been using silver alginate. Her husband is keeping her off this as best he can 5/12 I thought this patient might be healed this week. She has a very narrow linear but difficult wound in the lower coccyx area. She has been using silver alginate. Electronic Signature(s) Besson, July L. ([id number]) Signed: 10/04/2019 7:53:18 AM By: Linton Ham MD Entered By: Linton Ham on 10/03/2019 08:45:51 Amy [name], [name] ([id number]) -------------------------------------------------------------------------------- Physical Exam Details Patient Name: Amy [name], [name] Date of Service: 10/03/2019 8:30 AM Medical Record Number: [id number] Patient Account Number: [id number] Date of Birth/Sex: [birth year] (84 y.o. F) Treating RN: Cornell Barman Primary Care Provider: HENDRICKSON, CLIFFORD Other Clinician: Referring Provider: Raelyn Ensign Treating Provider/Extender: Dellia Nims  Princessa Lesmeister G Weeks in Treatment: 3 Constitutional Patient is hypertensive.. Pulse regular and within target range for patient.[name] Respirations regular, non-labored and within target range.. Temperature is normal and within the target range for the patient.[name] appears in no distress. Respiratory Respiratory effort is easy and symmetric bilaterally. Rate is normal at rest and on room air.. Integumentary (Hair, Skin) No erythema around the wound. Psychiatric Extremely anxious. Cognitive impairment this appears to be baseline. Notes Wound exam; under careful illumination the wound in the gluteal cleft towards the coccyx. This week most of this is epithelialized however is it is just not as vibrant as I would like at 12:00 in  the most proximal part of the linear wound. There is no evidence of infection Electronic Signature(s) Signed: 10/04/2019 7:53:18 AM By: Linton Ham MD Entered By: Linton Ham on 10/03/2019 08:49:30 Amy [name], [name] ([id number]) -------------------------------------------------------------------------------- Physician Orders Details Patient Name: Amy [name], [name] Date of Service: 10/03/2019 8:30 AM Medical Record Number: [id number] Patient Account Number: [id number] Date of Birth/Sex: 1935/08/18 (84 y.o. F) Treating RN: Cornell Barman Primary Care Provider: HENDRICKSON, CLIFFORD Other Clinician: Referring Provider: Raelyn Ensign Treating Provider/Extender: Tito Dine in Treatment: 3 Verbal / Phone Orders: No Diagnosis Coding Skin Barriers/Peri-Wound Care Wound #5 Midline Gluteus o Other: - Keep area clean and dry Primary Wound Dressing Wound #5 Midline Gluteus o Other: - silver cell placed onto wound Dressing Change Frequency Wound #5 Midline Gluteus o Change Dressing Monday, Wednesday, Friday - or more often if soiled Follow-up Appointments Wound #5 Midline Gluteus o Return Appointment in 2 weeks. Off-Loading Wound #5 Midline Gluteus o Turn and reposition every 2 hours - keep pressure off of wounded areas Home Health Wound #5 Midline [place] Visits - [facility] Nurse may visit PRN to address patientos wound care needs. o FACE TO FACE ENCOUNTER: MEDICARE and MEDICAID PATIENTS: I certify that this patient is under my care and that I had a face-to-face encounter that meets the physician face-to-face encounter requirements with this patient on this date. The encounter with the patient was in whole or in part for the following MEDICAL CONDITION: (primary reason for [place]) MEDICAL NECESSITY: I certify, that based on my findings, NURSING services are a medically necessary home health service. HOME  BOUND STATUS: I certify that my clinical findings support that this patient is homebound (i.e., Due to illness or injury, pt requires aid of supportive devices such as crutches, cane, wheelchairs, walkers, the use of special transportation or the assistance of another person to leave their place of residence. There is a normal inability to leave the home and doing so requires considerable and taxing effort. Other absences are for medical reasons / religious services and are infrequent or of short duration when for other reasons). o If current dressing causes regression in wound condition, may D/C ordered dressing product/s and apply Normal Saline Moist Dressing daily until next [place] / Other MD appointment. [place] of regression in wound condition at [phone]. o Please direct any NON-WOUND related issues/requests for orders to patient's Primary Care Physician Electronic Signature(s) Signed: 10/04/2019 7:53:18 AM By: Linton Ham MD Signed: 11/20/2019 12:53:52 PM By: Gretta Cool, BSN, RN, CWS, Kim RN, BSN Entered By: Gretta Cool, BSN, RN, CWS, Kim on 10/03/2019 08:43:35 Amy [name], [name] ([id number]) -------------------------------------------------------------------------------- Problem List Details Patient Name: Amy [name], [name] Date of Service: 10/03/2019 8:30 AM Medical Record Number: [id number] Patient Account Number: [id number] Date of Birth/Sex: 12-04-1935 (83 y.o.  F) Treating RN: Cornell Barman Primary Care Provider: HENDRICKSON, CLIFFORD Other Clinician: Referring Provider: Raelyn Ensign Treating Provider/Extender: Tito Dine in Treatment: 3 Active Problems ICD-10 Encounter Code Description Active Date MDM Diagnosis L89.152 Pressure ulcer of sacral region, stage 2 09/12/2019 No Yes Inactive Problems Resolved Problems Electronic Signature(s) Signed: 10/04/2019 7:53:18 AM By: Linton Ham MD Entered By: Linton Ham on  10/03/2019 08:44:28 Amy [name], [name] ([id number]) -------------------------------------------------------------------------------- Progress Note Details Patient Name: Amy [name], [name] Date of Service: 10/03/2019 8:30 AM Medical Record Number: [id number] Patient Account Number: [id number] Date of Birth/Sex: November 12, 1935 (84 y.o. F) Treating RN: Cornell Barman Primary Care Provider: HENDRICKSON, CLIFFORD Other Clinician: Referring Provider: Raelyn Ensign Treating Provider/Extender: Ricard Dillon Weeks in Treatment: 3 Subjective History of Present Illness (HPI) The following HPI elements were documented for the patient's wound: Associated Signs and Symptoms: The patient has a history of chronic kidney disease, diabetes mellitus type II although her hemoglobin A1c most recent was 5.8 and she is on the medications. 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 She is incontinent. 03/10/18 on evaluation today patient actually presents for evaluation concerning her bilateral gluteal ulcers which have been present for several weeks. She has been treated over time with different creams most recently polysporin. With that being said the patient actually seems to be doing somewhat better at this point in time during evaluation today. Nonetheless she still does have an opening mainly on the left side the right pretty much appears to be closed in my opinion. She does have pain on the left gluteal region. She also tells me about an area under the abdominal fold in the lower abdominal region which appears to be new just yesterday or today. This appears to potentially be a little bit of a fungal rash versus possibly friction from the way her husband has to help her with transfer. No fevers, chills, nausea, or vomiting noted at this time. Patient has dementia and has a very  difficult time sometimes following directions. Her husband does the best he can taking care for home but he states is difficult sometimes to get her to do anything but just sit either in her wheelchair or the recliner which is probably why she's having such issues with her gluteal region. Nonetheless considering how much time she seems to spend sitting I'm pleased that the wounds are not any worse in appearance. 03/17/18 on evaluation today patient actually appears to be completely healed in regard to her ulcers on her gluteal region. She has been tolerating the dressing changes without complication. Her abdominal area also appears to be doing better there is no opening at the site either she continues to use the cream that was prescribed at the last visit. That is the next one to one of zinc oxide and nystatin cream. Overall this seems to be doing very well at this point in time. Readmission: 04/09/2019 on evaluation today patient appears to be doing somewhat more poorly. This is a patient whom I saw almost a year ago. She has had some discoloration in her gluteal region in general since that time. Again I think this is always seemingly due to excessive pressure to the area. She does have her husband with her who is actually her primary caregiver and does a very good job with her in my opinion. With that being said he obviously is somewhat  frustrated in general with the fact that this keeps staying open he is also frustrated with the fact that she seems to not really want to stay in the bed to offload and lay on her side she likes to lay on her back he is having a very hard time finding a way to help her keep pressure off of the region. She does have dementia and this is playing a big role as well. Subsequently he did try to get her a different mattress to go underneath her chair in order to help with offloading but she would not even give it a try. Subsequently he did try donut as well but that  just she seems to be moving. He really cannot keep that in the right place. I suggested to him that a piece of memory foam may be better as far as not having to worry about exact positioning this would help with giving additional pressure offloading support. 04/16/2019 on evaluation today patient actually appears to be completely healed based on what I am seeing. Unfortunately she did not like the [facility] along with a border foam dressing that we ordered at all. She complained that it hurt as soon as she sat on it and she did not want to wear it. Subsequently they ended up discontinuing that and have just been using the Desitin at this point. He did actually have his son help him to put a memory foam mattress on the bed and he also had several pieces he was able to cut to make memory foam cushions for her chair and that has seemed to make a dramatic improvement with regard to the pressure to her gluteal region. With that being said the patient also had been complaining of back pain pretty much every morning and also has not been complaining of this either. Overall he is actually very pleased with how things are doing with the memory foam. READMISSION 09/12/2019 This is a now 84 year old woman we have seen in clinic on 2 separate occasions but I do not believe I have seen her in the past. She was here in 2018 and again in 2020 for gluteal wounds which were felt to be pressure. She has dementia when she sits and lies on her bed on her buttocks and according to her husband there is just no way to get her off this. Complicating the issue is she has urinary incontinence and although her husband attempts to change this frequently he often finds the area wet with urine. The patient is nonambulatory. She is in a wheelchair or lying on her back in bed. She does have memory foam over the mattress in the wheelchair. She is here today as she is felt to have new wounds. Our intake and nurse initially did  not notice anything but some excoriation on the skin of her buttocks around the gluteal cleft. Past medical history includes type 2 diabetes, left AKA, urinary incontinence, prior history of left nephrectomy for malignancy, dementia, hypertension, history of PAD. She has been on oxygen for 2 L for about a year. I am not sure what the primary diagnosis is. 5/5; 2-week follow-up. This is a patient with a very narrow linear wound in the lower sacrum coccyx area. She has been using silver alginate. Her husband is keeping her off this as best he can 5/12 I thought this patient might be healed this week. She has a very narrow linear but difficult wound in the lower coccyx area. She has been  using silver alginate. Amy [name], [name] ([id number]) Objective Constitutional Patient is hypertensive.. Pulse regular and within target range for patient.[name] Respirations regular, non-labored and within target range.. Temperature is normal and within the target range for the patient.[name] appears in no distress. Vitals Time Taken: 8:30 AM, Temperature: 98.0 F, Pulse: 79 bpm, Respiratory Rate: 16 breaths/min, Blood Pressure: 142/74 mmHg. Respiratory Respiratory effort is easy and symmetric bilaterally. Rate is normal at rest and on room air.[name] Psychiatric Extremely anxious. Cognitive impairment this appears to be baseline. General Notes: Wound exam; under careful illumination the wound in the gluteal cleft towards the coccyx. This week most of this is epithelialized however is it is just not as vibrant as I would like at 12:00 in the most proximal part of the linear wound. There is no evidence of infection Integumentary (Hair, Skin) No erythema around the wound. Wound #5 status is Open. Original cause of wound was Pressure Injury. The wound is located on the Midline Gluteus. The wound measures 3cm length x 0.2cm width x 0.1cm depth; 0.471cm^2 area and 0.047cm^3 volume. There is Fat Layer (Subcutaneous Tissue)  Exposed exposed. There is a small amount of serous drainage noted. The wound margin is flat and intact. There is large (67-100%) granulation within the wound bed. There is no necrotic tissue within the wound bed. Assessment Active Problems ICD-10 Pressure ulcer of sacral region, stage 2 Plan Skin Barriers/Peri-Wound Care: Wound #5 Midline Gluteus: Other: - Keep area clean and dry Primary Wound Dressing: Wound #5 Midline Gluteus: Other: - silver cell placed onto wound Dressing Change Frequency: Wound #5 Midline Gluteus: Change Dressing Monday, Wednesday, Friday - or more often if soiled Follow-up Appointments: Wound #5 Midline Gluteus: Return Appointment in 2 weeks. Off-Loading: Wound #5 Midline Gluteus: Turn and reposition every 2 hours - keep pressure off of wounded areas Home Health: Wound #5 Midline Gluteus: Continue Home Health Visits - [place] Nurse may visit PRN to address patient s wound care needs. FACE TO FACE ENCOUNTER: MEDICARE and MEDICAID PATIENTS: I certify that this patient is under my care and that I had a face-to-face encounter that meets the physician face-to-face encounter requirements with this patient on this date. The encounter with the patient was in whole or in part for the following MEDICAL CONDITION: (primary reason for [place]) MEDICAL NECESSITY: I certify, that based on my findings, NURSING services are a medically necessary home health service. HOME BOUND STATUS: I certify that my clinical findings support that this patient is homebound (i.e., Due to illness or injury, pt requires aid of supportive devices such as crutches, cane, wheelchairs, walkers, the use of special transportation or the assistance of another person to leave their place of residence. There is a normal inability to leave the home and doing so requires considerable and taxing effort. Other absences are for medical reasons / religious services and are infrequent or  of short duration when for other reasons). If current dressing causes regression in wound condition, may D/C ordered dressing product/s and apply Normal Saline Moist Dressing daily until Amy [name], [name] ([id number]) next [place] / Other MD appointment. [place] of regression in wound condition at [phone]. Please direct any NON-WOUND related issues/requests for orders to patient's Primary Care Physician 1. Still using silver alginate. I cannot think of a better alternative 2. Wound is in a very difficult area to offload successfully Electronic Signature(s) Signed: 10/04/2019 7:53:18 AM By: Linton Ham MD Entered By: Linton Ham on 10/03/2019 08:50:08  Amy [name], [name] ([id number]) -------------------------------------------------------------------------------- SuperBill Details Patient Name: KEALEY, KEMMER L. Date of Service: 10/03/2019 Medical Record Number: [id number] Patient Account Number: [id number] Date of Birth/Sex: 1936/04/27 (84 y.o. F) Treating RN: Cornell Barman Primary Care Provider: HENDRICKSON, CLIFFORD Other Clinician: Referring Provider: Raelyn Ensign Treating Provider/Extender: Ricard Dillon Weeks in Treatment: 3 Diagnosis Coding ICD-10 Codes Code Description G25.427 Pressure ulcer of sacral region, stage 2 Facility Procedures CPT4 Code: [id number] Description: [facility] VISIT-LEV 3 EST PT Modifier: Quantity: 1 Physician Procedures CPT4 Code: [id number] Description: 60737 - WC PHYS LEVEL 3 - EST PT Modifier: Quantity: 1 CPT4 Code: Description: ICD-10 Diagnosis Description L89.152 Pressure ulcer of sacral region, stage 2 Modifier: Quantity: Electronic Signature(s) Signed: 10/04/2019 7:53:18 AM By: Linton Ham MD Entered By: Linton Ham on 10/03/2019 08:50:47

## 2019-11-20 NOTE — Progress Notes (Signed)
Nuzum, Tamikia L. ([id number]) Visit Report for 10/03/2019 Arrival Information Details Patient Name: LASONJA, LAKINS. Date of Service: 10/03/2019 8:30 AM Medical Record Number: [id number] Patient Account Number: [id number] Date of Birth/Sex: 06-18-1935 (84 y.o. F) Treating RN: Army Melia Primary Care Alessandra Sawdey: HENDRICKSON, CLIFFORD Other Clinician: Referring Fouad Taul: Raelyn Ensign Treating Lamees Gable/Extender: Tito Dine in Treatment: 3 Visit Information History Since Last Visit Added or deleted any medications: No Patient Arrived: Wheel Chair Any new allergies or adverse reactions: No Arrival Time: 08:30 Had a fall or experienced change in No Accompanied By: husband activities of daily living that may affect Transfer Assistance: None risk of falls: Patient Identification Verified: Yes Signs or symptoms of abuse/neglect since last visito No Hospitalized since last visit: No Has Dressing in Place as Prescribed: Yes Pain Present Now: No Electronic Signature(s) Signed: 10/03/2019 11:15:40 AM By: Army Melia Entered By: Army Melia on 10/03/2019 08:30:25 Landgrebe, Emeli L. ([id number]) -------------------------------------------------------------------------------- Clinic Level of Care Assessment Details Patient Name: CIARRAH, RAE L. Date of Service: 10/03/2019 8:30 AM Medical Record Number: [id number] Patient Account Number: [id number] Date of Birth/Sex: 1935/06/27 (84 y.o. F) Treating RN: Cornell Barman Primary Care Brithney Bensen: HENDRICKSON, CLIFFORD Other Clinician: Referring Mikaele Stecher: Raelyn Ensign Treating Joseph Johns/Extender: Tito Dine in Treatment: 3 Clinic Level of Care Assessment Items TOOL 4 Quantity Score []  - Use when only an EandM is performed on FOLLOW-UP visit 0 ASSESSMENTS - Nursing Assessment / Reassessment X - Reassessment of Co-morbidities (includes updates in patient status) 1 10 X- 1 5 Reassessment of Adherence to Treatment  Plan ASSESSMENTS - Wound and Skin Assessment / Reassessment X - Simple Wound Assessment / Reassessment - one wound 1 5 []  - 0 Complex Wound Assessment / Reassessment - multiple wounds []  - 0 Dermatologic / Skin Assessment (not related to wound area) ASSESSMENTS - Focused Assessment []  - Circumferential Edema Measurements - multi extremities 0 []  - 0 Nutritional Assessment / Counseling / Intervention []  - 0 Lower Extremity Assessment (monofilament, tuning fork, pulses) []  - 0 Peripheral Arterial Disease Assessment (using hand held doppler) ASSESSMENTS - Ostomy and/or Continence Assessment and Care []  - Incontinence Assessment and Management 0 []  - 0 Ostomy Care Assessment and Management (repouching, etc.) PROCESS - Coordination of Care X - Simple Patient / Family Education for ongoing care 1 15 []  - 0 Complex (extensive) Patient / Family Education for ongoing care X- 1 10 Staff obtains Programmer, systems, Records, Test Results / Process Orders []  - 0 Staff telephones HHA, Nursing Homes / Clarify orders / etc []  - 0 Routine Transfer to another Facility (non-emergent condition) []  - 0 Routine Hospital Admission (non-emergent condition) []  - 0 New Admissions / Biomedical engineer / Ordering NPWT, Apligraf, etc. []  - 0 Emergency Hospital Admission (emergent condition) X- 1 10 Simple Discharge Coordination []  - 0 Complex (extensive) Discharge Coordination PROCESS - Special Needs []  - Pediatric / Minor Patient Management 0 []  - 0 Isolation Patient Management []  - 0 Hearing / Language / Visual special needs []  - 0 Assessment of Community assistance (transportation, D/C planning, etc.) []  - 0 Additional assistance / Altered mentation []  - 0 Support Surface(s) Assessment (bed, cushion, seat, etc.) INTERVENTIONS - Wound Cleansing / Measurement Thieme, Ivyrose L. ([id number]) X- 1 5 Simple Wound Cleansing - one wound []  - 0 Complex Wound Cleansing - multiple wounds X- 1  5 Wound Imaging (photographs - any number of wounds) []  - 0 Wound Tracing (instead of photographs) X- 1 5 Simple Wound Measurement - one wound []  -  0 Complex Wound Measurement - multiple wounds INTERVENTIONS - Wound Dressings X - Small Wound Dressing one or multiple wounds 1 10 []  - 0 Medium Wound Dressing one or multiple wounds []  - 0 Large Wound Dressing one or multiple wounds []  - 0 Application of Medications - topical []  - 0 Application of Medications - injection INTERVENTIONS - Miscellaneous []  - External ear exam 0 []  - 0 Specimen Collection (cultures, biopsies, blood, body fluids, etc.) []  - 0 Specimen(s) / Culture(s) sent or taken to Lab for analysis []  - 0 Patient Transfer (multiple staff / Civil Service fast streamer / Similar devices) []  - 0 Simple Staple / Suture removal (25 or less) []  - 0 Complex Staple / Suture removal (26 or more) []  - 0 Hypo / Hyperglycemic Management (close monitor of Blood Glucose) []  - 0 Ankle / Brachial Index (ABI) - do not check if billed separately X- 1 5 Vital Signs Has the patient been seen at the hospital within the last three years: Yes Total Score: 85 Level Of Care: New/Established - Level 3 Electronic Signature(s) Signed: 11/20/2019 12:53:52 PM By: Gretta Cool, BSN, RN, CWS, Kim RN, BSN Entered By: Gretta Cool, BSN, RN, CWS, Kim on 10/03/2019 08:44:42 Fraticelli, Nivedita L. ([id number]) -------------------------------------------------------------------------------- Encounter Discharge Information Details Patient Name: TYRELL, BRERETON L. Date of Service: 10/03/2019 8:30 AM Medical Record Number: [id number] Patient Account Number: [id number] Date of Birth/Sex: [birth year] (84 y.o. F) Treating RN: Cornell Barman Primary Care Deyani Hegarty: HENDRICKSON, CLIFFORD Other Clinician: Referring Daci Stubbe: Raelyn Ensign Treating Harjit Leider/Extender: Tito Dine in Treatment: 3 Encounter Discharge Information Items Discharge Condition: Stable Ambulatory  Status: Wheelchair Discharge Destination: Home Transportation: Private Auto Accompanied By: husband Schedule Follow-up Appointment: Yes Clinical Summary of Care: Electronic Signature(s) Signed: 11/20/2019 12:53:52 PM By: Gretta Cool, BSN, RN, CWS, Kim RN, BSN Entered By: Gretta Cool, BSN, RN, CWS, Kim on 10/03/2019 08:46:03 Shurley, Kierre L. ([id number]) -------------------------------------------------------------------------------- Lower Extremity Assessment Details Patient Name: PAPA, Giovanna L. Date of Service: 10/03/2019 8:30 AM Medical Record Number: [id number] Patient Account Number: [id number] Date of Birth/Sex: January 19, 1936 (84 y.o. F) Treating RN: Army Melia Primary Care Ricke Kimoto: HENDRICKSON, CLIFFORD Other Clinician: Referring Margarito Dehaas: Raelyn Ensign Treating Park Beck/Extender: Ricard Dillon Weeks in Treatment: 3 Electronic Signature(s) Signed: 10/03/2019 11:15:40 AM By: Army Melia Entered By: Army Melia on 10/03/2019 08:36:38 Morino, Cleona L. ([id number]) -------------------------------------------------------------------------------- Multi Wound Chart Details Patient Name: CABEZA, Terisa L. Date of Service: 10/03/2019 8:30 AM Medical Record Number: [id number] Patient Account Number: [id number] Date of Birth/Sex: 1935-10-03 (84 y.o. F) Treating RN: Cornell Barman Primary Care Revonda Menter: HENDRICKSON, CLIFFORD Other Clinician: Referring Jareb Radoncic: Raelyn Ensign Treating Tandy Grawe/Extender: Ricard Dillon Weeks in Treatment: 3 Vital Signs Height(in): Pulse(bpm): 40 Weight(lbs): Blood Pressure(mmHg): 142/74 Body Mass Index(BMI): Temperature(F): 98.0 Respiratory Rate(breaths/min): 16 Photos: [N/A:N/A] Wound Location: Midline Gluteus N/A N/A Wounding Event: Pressure Injury N/A N/A Primary Etiology: Pressure Ulcer N/A N/A Secondary Etiology: MASD N/A N/A Comorbid History: Cataracts, Anemia, Hypertension, N/A N/A Peripheral Arterial Disease, Type II Diabetes,  Osteoarthritis, Dementia, Received Radiation Date Acquired: 08/20/2019 N/A N/A Weeks of Treatment: 3 N/A N/A Wound Status: Open N/A N/A Clustered Wound: Yes N/A N/A Clustered Quantity: 1 N/A N/A Measurements L x W x D (cm) 3x0.2x0.1 N/A N/A Area (cm) : 0.471 N/A N/A Volume (cm) : 0.047 N/A N/A % Reduction in Area: 82.10% N/A N/A % Reduction in Volume: 82.10% N/A N/A Classification: Category/Stage II N/A N/A Exudate Amount: Small N/A N/A Exudate Type: Serous N/A N/A Exudate Color: amber N/A N/A Wound Margin: Flat and Intact N/A N/A  Granulation Amount: Large (67-100%) N/A N/A Necrotic Amount: None Present (0%) N/A N/A Exposed Structures: Fat Layer (Subcutaneous Tissue) N/A N/A Exposed: Yes Fascia: No Tendon: No Muscle: No Joint: No Bone: No Epithelialization: Medium (34-66%) N/A N/A Treatment Notes Electronic Signature(s) Signed: 10/04/2019 7:53:18 AM By: Linton Ham MD Entered By: Linton Ham on 10/03/2019 08:44:50 Halt, Giavonna L. ([id number]) Wilmeth, Zandria L. ([id number]) -------------------------------------------------------------------------------- [place] Details Patient Name: MATISSE, ROSKELLEY. Date of Service: 10/03/2019 8:30 AM Medical Record Number: [id number] Patient Account Number: [id number] Date of Birth/Sex: 1935/11/10 (84 y.o. F) Treating RN: Cornell Barman Primary Care Alizza Sacra: HENDRICKSON, CLIFFORD Other Clinician: Referring Shona Pardo: Raelyn Ensign Treating Taria Castrillo/Extender: Tito Dine in Treatment: 3 Active Inactive Orientation to the Wound Care Program Nursing Diagnoses: Knowledge deficit related to the wound healing center program Goals: Patient/caregiver will verbalize understanding of the [facility] Program Date Initiated: 09/12/2019 Target Resolution Date: 09/12/2019 Goal Status: Active Interventions: Provide education on orientation to the wound center Notes: Pressure Nursing  Diagnoses: Knowledge deficit related to causes and risk factors for pressure ulcer development Goals: Patient will remain free of pressure ulcers Date Initiated: 09/12/2019 Target Resolution Date: 08/27/2019 Goal Status: Active Interventions: Assess: immobility, friction, shearing, incontinence upon admission and as needed Assess offloading mechanisms upon admission and as needed Notes: Wound/Skin Impairment Nursing Diagnoses: Impaired tissue integrity Goals: Ulcer/skin breakdown will have a volume reduction of 30% by week 4 Date Initiated: 09/12/2019 Target Resolution Date: 10/12/2019 Goal Status: Active Interventions: Assess ulceration(s) every visit Treatment Activities: Topical wound management initiated : 09/12/2019 Notes: Electronic Signature(s) Signed: 11/20/2019 12:53:52 PM By: Gretta Cool, BSN, RN, CWS, Kim RN, BSN Entered By: Gretta Cool, BSN, RN, CWS, Kim on 10/03/2019 08:40:46 Nam, Janazia L. ([id number]) -------------------------------------------------------------------------------- Pain Assessment Details Patient Name: RAFFAELA, LADLEY L. Date of Service: 10/03/2019 8:30 AM Medical Record Number: [id number] Patient Account Number: [id number] Date of Birth/Sex: 01-Nov-1935 (84 y.o. F) Treating RN: Army Melia Primary Care Aleigh Grunden: HENDRICKSON, CLIFFORD Other Clinician: Referring Lyliana Dicenso: Raelyn Ensign Treating Bertice Risse/Extender: Ricard Dillon Weeks in Treatment: 3 Active Problems Location of Pain Severity and Description of Pain Patient Has Paino No Site Locations Pain Management and Medication Current Pain Management: Electronic Signature(s) Signed: 10/03/2019 11:15:40 AM By: Army Melia Entered By: Army Melia on 10/03/2019 08:31:55 Sanmiguel, Kathie L. ([id number]) -------------------------------------------------------------------------------- Patient/Caregiver Education Details Patient Name: ERYN, MARANDOLA L. Date of Service: 10/03/2019 8:30  AM Medical Record Number: [id number] Patient Account Number: [id number] Date of Birth/Gender: 08/17/1935 (84 y.o. F) Treating RN: Cornell Barman Primary Care Physician: Lebron Conners Other Clinician: Referring Physician: Raelyn Ensign Treating Physician/Extender: Tito Dine in Treatment: 3 Education Assessment Education Provided To: Patient Education Topics Provided Welcome To [facility]: Handouts: Welcome [facility] Methods: Demonstration, Explain/Verbal Responses: State content correctly Wound Debridement: Wound/Skin Impairment: Handouts: Caring for Your Ulcer Methods: Demonstration, Explain/Verbal Responses: State content correctly Electronic Signature(s) Signed: 11/20/2019 12:53:52 PM By: Gretta Cool, BSN, RN, CWS, Kim RN, BSN Entered By: Gretta Cool, BSN, RN, CWS, Kim on 10/03/2019 08:45:18 Hodkinson, Naylene L. ([id number]) -------------------------------------------------------------------------------- Wound Assessment Details Patient Name: ANGELETTI, Amberli L. Date of Service: 10/03/2019 8:30 AM Medical Record Number: [id number] Patient Account Number: [id number] Date of Birth/Sex: February 28, 1936 (84 y.o. F) Treating RN: Army Melia Primary Care Frisco Cordts: HENDRICKSON, CLIFFORD Other Clinician: Referring Cordney Barstow: Raelyn Ensign Treating Freya Zobrist/Extender: Ricard Dillon Weeks in Treatment: 3 Wound Status Wound Number: 5 Primary Pressure Ulcer Etiology: Wound Location: Midline Gluteus Secondary MASD Wounding Event: Pressure Injury Etiology: Date Acquired: 08/20/2019 Wound Open  Weeks Of Treatment: 3 Status: Clustered Wound: Yes Comorbid Cataracts, Anemia, Hypertension, Peripheral Arterial History: Disease, Type II Diabetes, Osteoarthritis, Dementia, Received Radiation Photos Wound Measurements Length: (cm) 3 Width: (cm) 0.2 Depth: (cm) 0.1 Clustered Quantity: 1 Area: (cm) 0.471 Volume: (cm) 0.047 % Reduction in Area: 82.1% %  Reduction in Volume: 82.1% Epithelialization: Medium (34-66%) Wound Description Classification: Category/Stage II Wound Margin: Flat and Intact Exudate Amount: Small Exudate Type: Serous Exudate Color: amber Foul Odor After Cleansing: No Slough/Fibrino No Wound Bed Granulation Amount: Large (67-100%) Exposed Structure Necrotic Amount: None Present (0%) Fascia Exposed: No Fat Layer (Subcutaneous Tissue) Exposed: Yes Tendon Exposed: No Muscle Exposed: No Joint Exposed: No Bone Exposed: No Electronic Signature(s) Signed: 10/03/2019 11:15:40 AM By: Army Melia Entered By: Army Melia on 10/03/2019 08:36:17 Briles, Fatuma L. ([id number]) -------------------------------------------------------------------------------- Vitals Details Patient Name: LURIA, ROSARIO L. Date of Service: 10/03/2019 8:30 AM Medical Record Number: [id number] Patient Account Number: [id number] Date of Birth/Sex: 03-Jul-1935 (84 y.o. F) Treating RN: Army Melia Primary Care Brendyn Mclaren: HENDRICKSON, CLIFFORD Other Clinician: Referring Pelagia Iacobucci: Raelyn Ensign Treating Daevon Holdren/Extender: Ricard Dillon Weeks in Treatment: 3 Vital Signs Time Taken: 08:30 Temperature (F): 98.0 Pulse (bpm): 79 Respiratory Rate (breaths/min): 16 Blood Pressure (mmHg): 142/74 Reference Range: 80 - 120 mg / dl Electronic Signature(s) Signed: 10/03/2019 11:15:40 AM By: Army Melia Entered By: Army Melia on 10/03/2019 08:31:50

## 2019-11-21 ENCOUNTER — Ambulatory Visit: Payer: Self-pay | Admitting: *Deleted

## 2019-11-21 DIAGNOSIS — R488 Other symbolic dysfunctions: Secondary | ICD-10-CM | POA: Diagnosis not present

## 2019-11-21 DIAGNOSIS — G301 Alzheimer's disease with late onset: Secondary | ICD-10-CM | POA: Diagnosis not present

## 2019-11-21 DIAGNOSIS — Z79899 Other long term (current) drug therapy: Secondary | ICD-10-CM | POA: Diagnosis not present

## 2019-11-21 DIAGNOSIS — I10 Essential (primary) hypertension: Secondary | ICD-10-CM | POA: Diagnosis not present

## 2019-11-21 DIAGNOSIS — F0281 Dementia in other diseases classified elsewhere with behavioral disturbance: Secondary | ICD-10-CM | POA: Diagnosis not present

## 2019-11-21 DIAGNOSIS — N390 Urinary tract infection, site not specified: Secondary | ICD-10-CM | POA: Diagnosis not present

## 2019-11-21 DIAGNOSIS — F02818 Dementia in other diseases classified elsewhere, unspecified severity, with other behavioral disturbance: Secondary | ICD-10-CM

## 2019-11-21 NOTE — Patient Instructions (Signed)
Thank you allowing the Chronic Care Management Team to be a part of your care! It was a pleasure speaking with you today!  1. Please call this social worker with any questions or concerns regarding patient's level of care concerns  CCM (Chronic Care Management) Team   Neldon Labella RN, BSN Nurse Care Coordinator  [phone]  [place]ely [place], LCSW Clinical Social Worker [phone]  Goals Addressed              This Visit's Progress   .  Per patient's spouse "I think I need to look in to long term care for my wife" completed (pt-stated)        CARE PLAN ENTRY (see longitudinal plan of care for additional care plan information)  Current Barriers:  . Level of care concerns  Clinical Social Work Clinical Goal(s):  [name] Over the next 90 days, patient's spouse will work with SW to address concerns related to patient's level of care needs  Interventions:  . Phone call to patient's spouse to confirm that patient is fully transitioned to [facility] . Encouraged continued collaboration with Elder Training and development officer . Emotional support provided, encouraged the expectation of an adjustment period    Patient Self Care Activities:  . Attends all scheduled provider appointments . Unable to perform ADLs independently . Unable to perform IADLs independently . Knowledge deficit of the out of home placement process  Please see past updates related to this goal by clicking on the "Past Updates" button in the selected goal          The patient verbalized understanding of instructions provided today and declined a print copy of patient instruction materials.   No further follow up required: patient's spouse to call this Education officer, museum with any questions or concerns related to pateint's commmunity resource needs

## 2019-11-21 NOTE — Chronic Care Management (AMB) (Signed)
Chronic Care Management    Clinical Social Work Follow Up Note  11/21/2019 Name: Amy [name] MRN: [id number] DOB: February 09, 1936  Amy [name] is a 84 y.o. year old female who is a primary care patient of Amy Malkin, MD. The CCM team was consulted for assistance with Level of Care Concerns.   Review of patient status, including review of consultants reports, other relevant assessments, and collaboration with appropriate care team members and the patient's provider was performed as part of comprehensive patient evaluation and provision of chronic care management services.    SDOH (Social Determinants of Health) assessments performed: No    Outpatient Encounter Medications as of 11/21/2019  Medication Sig  . Accu-Chek Softclix Lancets lancets Use as instructed  . acetaminophen (TYLENOL) 325 MG tablet Take 325 mg by mouth every 6 (six) hours as needed.  [name] albuterol (VENTOLIN HFA) 108 (90 Base) MCG/ACT inhaler Inhale 1-2 puffs into the lungs every 4 (four) hours as needed.   [name] atorvastatin (LIPITOR) 20 MG tablet Take 1 tablet (20 mg total) by mouth daily at 6 PM.  . clopidogrel (PLAVIX) 75 MG tablet TAKE 1 TABLET EVERY DAY  . Cyanocobalamin (VITAMIN B12 PO) Take by mouth.  . donepezil (ARICEPT) 10 MG tablet Take 1 tablet (10 mg total) by mouth at bedtime.  . famotidine (PEPCID) 20 MG tablet Take 1 tablet (20 mg total) by mouth daily.  . ferrous sulfate 325 (65 FE) MG tablet Take 325 mg by mouth daily with breakfast. Pt taking every other day  . FLUoxetine (PROZAC) 10 MG capsule Take 1 capsule (10 mg total) by mouth daily.  [name] gabapentin (NEURONTIN) 300 MG capsule Take 1 capsule (300 mg total) by mouth 3 (three) times daily.  [name] HYDROcodone-acetaminophen (NORCO) 7.5-325 MG tablet Take 1 tablet by mouth every 8 (eight) hours as needed for moderate pain.  [name] ON 11/26/2019] HYDROcodone-acetaminophen (NORCO) 7.5-325 MG tablet Take 1 tablet by mouth every 8 (eight) hours as  needed for moderate pain.  [name] ON 12/26/2019] HYDROcodone-acetaminophen (NORCO) 7.5-325 MG tablet Take 1 tablet by mouth every 8 (eight) hours as needed for moderate pain.  [name] ipratropium-albuterol (DUONEB) 0.5-2.5 (3) MG/3ML SOLN INHALE THE CONTENTS OF 1 VIAL VIA NEBULIZER THREE TIMES DAILY AS DIRECTED  . pantoprazole (PROTONIX) 40 MG tablet Take 1 tablet (40 mg total) by mouth daily.  . QUEtiapine (SEROQUEL) 25 MG tablet Take 25 mg by mouth 2 (two) times daily. Takes at 2 pm and Total 75 mg a hs.  . QUEtiapine (SEROQUEL) 50 MG tablet Take 50 mg by mouth at bedtime.  . Thiamine HCl (VITAMIN B-1) 250 MG tablet Take 250 mg by mouth daily.   No facility-administered encounter medications on file as of 11/21/2019.     Goals Addressed              This Visit's Progress   .  Per patient's spouse "I think I need to look in to long term care for my wife" completed (pt-stated)        CARE PLAN ENTRY (see longitudinal plan of care for additional care plan information)  Current Barriers:  . Level of care concerns  Clinical Social Work Clinical Goal(s):  [name] Over the next 90 days, patient's spouse will work with SW to address concerns related to patient's level of care needs  Interventions:  . Phone call to patient's spouse to confirm that patient is fully transitioned to [facility] . Encouraged continued collaboration with Elder  Training and development officer . Emotional support provided, encouraged the expectation of an adjustment period    Patient Self Care Activities:  . Attends all scheduled provider appointments . Unable to perform ADLs independently . Unable to perform IADLs independently . Knowledge deficit of the out of home placement process  Please see past updates related to this goal by clicking on the "Past Updates" button in the selected goal          Follow Up Plan: Client's spouse will call this social worker with questions or concerns regarding patient' level of care  needs   Elliot Gurney, [facility] Care Management [phone]

## 2019-11-27 DIAGNOSIS — E119 Type 2 diabetes mellitus without complications: Secondary | ICD-10-CM | POA: Diagnosis not present

## 2019-11-27 DIAGNOSIS — R6889 Other general symptoms and signs: Secondary | ICD-10-CM | POA: Diagnosis not present

## 2019-11-27 DIAGNOSIS — D649 Anemia, unspecified: Secondary | ICD-10-CM | POA: Diagnosis not present

## 2019-11-28 DIAGNOSIS — F418 Other specified anxiety disorders: Secondary | ICD-10-CM | POA: Diagnosis not present

## 2019-11-28 DIAGNOSIS — I10 Essential (primary) hypertension: Secondary | ICD-10-CM | POA: Diagnosis not present

## 2019-11-28 DIAGNOSIS — I739 Peripheral vascular disease, unspecified: Secondary | ICD-10-CM | POA: Diagnosis not present

## 2019-11-28 DIAGNOSIS — F0390 Unspecified dementia without behavioral disturbance: Secondary | ICD-10-CM | POA: Diagnosis not present

## 2019-11-28 DIAGNOSIS — J449 Chronic obstructive pulmonary disease, unspecified: Secondary | ICD-10-CM | POA: Diagnosis not present

## 2019-11-28 DIAGNOSIS — E785 Hyperlipidemia, unspecified: Secondary | ICD-10-CM | POA: Diagnosis not present

## 2019-11-28 DIAGNOSIS — G8929 Other chronic pain: Secondary | ICD-10-CM | POA: Diagnosis not present

## 2019-11-28 DIAGNOSIS — L89612 Pressure ulcer of right heel, stage 2: Secondary | ICD-10-CM | POA: Diagnosis not present

## 2019-11-30 DIAGNOSIS — J449 Chronic obstructive pulmonary disease, unspecified: Secondary | ICD-10-CM | POA: Diagnosis not present

## 2019-12-03 ENCOUNTER — Other Ambulatory Visit: Payer: Self-pay

## 2019-12-03 ENCOUNTER — Non-Acute Institutional Stay: Payer: Medicare HMO | Admitting: Primary Care

## 2019-12-03 DIAGNOSIS — Z515 Encounter for palliative care: Secondary | ICD-10-CM | POA: Diagnosis not present

## 2019-12-03 DIAGNOSIS — F0281 Dementia in other diseases classified elsewhere with behavioral disturbance: Secondary | ICD-10-CM | POA: Diagnosis not present

## 2019-12-03 DIAGNOSIS — G301 Alzheimer's disease with late onset: Secondary | ICD-10-CM

## 2019-12-03 DIAGNOSIS — F02818 Dementia in other diseases classified elsewhere, unspecified severity, with other behavioral disturbance: Secondary | ICD-10-CM

## 2019-12-03 NOTE — Progress Notes (Addendum)
[facility] Consult Note Telephone: [phone]  Fax: [phone]  PATIENT NAME: Amy [name] [address] [place] [address] [phone] (home)  DOB: 02/04/1936 MRN: [id number]  PRIMARY CARE PROVIDER:    Juluis Pitch, MD [address] [place],  [place] [address]  REFERRING PROVIDER:   Juluis Pitch, MD [address] [place],  [place] [address]  RESPONSIBLE PARTY:   Extended Emergency Contact Information Primary Emergency Contact: Dunnigan,[name] Address: [address]          [place], [place] [address] Johnnette Litter of [place] Phone: [phone] Mobile Phone: [phone] Relation: Spouse Secondary Emergency Contact: [place], [place] Phone: [phone] Mobile Phone: [phone] Relation: Son  I met with Amy [name] in her nursing home room.    ASSESSMENT AND RECOMMENDATIONS:  1. Advance Care Planning/Goals of Care: Goals include to maximize quality of life and symptom management.  Advance directives on file. DNR in [facility]. Will upload latest MOST form.  I spoke with her husband who stated that he visited her daily. He stated that is somewhat stressful but that he is appreciative for the help with her care.   2. Symptom Management   Nutrition: Stable weights at this writing.She was finishing lunch but said the meat was tough. Please observe for intake issues with weight monitoring. Dietary supplements as needed.     Dementia: She states that she has begun to be comfortable [facility]. She is still asking frequently to go home. We discussed her husband and his hard work and how he needed help with her caregiving. She stated she understands but she still misses him and her home. She states her son does visit her at the nursing home. According to staff her mood has been better she hasn't been as agitated as reports when she was at home. She does occasionally experience some agitation but this is usually able to be redirected. Today  she denies pain or anxiety and looks relaxed, well nourished and peaceful.   3. Family /Caregiver/Community Supports: Husband Is POA, has son from previous marriage. Lives in [place].  4. Cognitive / Functional decline:  A and O x 1. Conversant. Dependent In all ialds, most adls. Can feed self.  5. Follow up Palliative Care Visit: Palliative care will continue to follow for goals of care clarification and symptom management. Return 4-6 weeks or prn.  I spent 35 minutes providing this consultation,  from 1200 pm to 1235. More than 50% of the time in this consultation was spent coordinating communication.   Chief Complain: Dementia with behavior disturbances.   HISTORY OF PRESENT ILLNESS:  Amy [name] is a 84 y.o. year old female with multiple medical problems including dementia, amputation, DM. Palliative Care was asked to follow this patient by consultation request of Juluis Pitch, MD  to help address advance care planning and goals of care. This is a follow up visit.  CODE STATUS: DNR  PPS: 40%  HOSPICE ELIGIBILITY/DIAGNOSIS: TBD  PAST MEDICAL HISTORY:  Past Medical History:  Diagnosis Date  . Anemia    low iron  . Anxiety   . Arthritis   . Bilateral cataracts   . Bronchitis    hx of   . Cataract   . Chronic kidney disease   . Depression   . Diabetes mellitus without complication ([facility])   . 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 Emphysema of lung ([facility])   .  GERD (gastroesophageal reflux disease)   . Headache   . History of frequent urinary tract infections   . Hx of renal cell cancer    LEFT  . Hyperlipidemia   . Hypertension   . Numbness and tingling    right hand   . Obesity   . Osteopenia 10/05/2017  . Peripheral artery disease (HCC)   . Urinary frequency   . Urinary incontinence     SOCIAL HX:  Social History   Tobacco Use  . Smoking status: Former Smoker    Packs/day: 1.50    Years: 51.00    Pack  years: 76.50    Types: Cigarettes    Quit date: 05/24/2004    Years since quitting: 15.5  . Smokeless tobacco: Never Used  . Tobacco comment: smoking cessation materials not required  Substance Use Topics  . Alcohol use: Not Currently    ALLERGIES:  Allergies  Allergen Reactions  . Fentanyl Other (See Comments)    urine retention  . Penicillins Itching, Rash and Other (See Comments)    Has patient had a PCN reaction causing immediate rash, facial/tongue/throat swelling, SOB or lightheadedness with hypotension: Yes Has patient had a PCN reaction causing severe rash involving mucus membranes or skin necrosis: No Has patient had a PCN reaction that required hospitalization: No Has patient had a PCN reaction occurring within the last 10 years: Yes If all of the above answers are "NO", then may proceed with Cephalosporin use.     PERTINENT MEDICATIONS:  Outpatient Encounter Medications as of 12/03/2019  Medication Sig  . Accu-Chek Softclix Lancets lancets Use as instructed  . acetaminophen (TYLENOL) 325 MG tablet Take 325 mg by mouth every 6 (six) hours as needed.  [name] albuterol (VENTOLIN HFA) 108 (90 Base) MCG/ACT inhaler Inhale 1-2 puffs into the lungs every 4 (four) hours as needed.   . Amino Acids-Protein Hydrolys (FEEDING SUPPLEMENT, PRO-STAT SUGAR FREE 64,) LIQD Take 30 mLs by mouth daily.  [name] atorvastatin (LIPITOR) 20 MG tablet Take 1 tablet (20 mg total) by mouth daily at 6 PM.  . clopidogrel (PLAVIX) 75 MG tablet TAKE 1 TABLET EVERY DAY  . donepezil (ARICEPT) 10 MG tablet Take 1 tablet (10 mg total) by mouth at bedtime.  . famotidine (PEPCID) 20 MG tablet Take 1 tablet (20 mg total) by mouth daily.  [name] FLUoxetine (PROZAC) 10 MG capsule Take 1 capsule (10 mg total) by mouth daily.  [name] gabapentin (NEURONTIN) 300 MG capsule Take 1 capsule (300 mg total) by mouth 3 (three) times daily.  Melene Muller ON 12/26/2019] HYDROcodone-acetaminophen (NORCO) 7.5-325 MG tablet Take 1 tablet by mouth every 8  (eight) hours as needed for moderate pain.  . Infant Care Products (DERMACLOUD EX) Apply 1 application topically 3 (three) times daily. To affected areas  . Melatonin 10 MG TABS Take 1 tablet by mouth at bedtime.  . pantoprazole (PROTONIX) 40 MG tablet Take 1 tablet (40 mg total) by mouth daily.  . QUEtiapine (SEROQUEL) 25 MG tablet Take 25 mg by mouth 2 (two) times daily. Takes at 2 pm and Total 75 mg a hs.  . QUEtiapine (SEROQUEL) 50 MG tablet Take 50 mg by mouth at bedtime.  [name] therapeutic multivitamin-minerals (THERAGRAN-M) tablet Take 1 tablet by mouth daily.  . [DISCONTINUED] Thiamine HCl (VITAMIN B-1) 250 MG tablet Take 250 mg by mouth daily.  . [DISCONTINUED] Cyanocobalamin (VITAMIN B12 PO) Take by mouth.  . [DISCONTINUED] ferrous sulfate 325 (65 FE) MG tablet Take 325 mg by  mouth daily with breakfast. Pt taking every other day  . [DISCONTINUED] HYDROcodone-acetaminophen (NORCO) 7.5-325 MG tablet Take 1 tablet by mouth every 8 (eight) hours as needed for moderate pain.  . [DISCONTINUED] ipratropium-albuterol (DUONEB) 0.5-2.5 (3) MG/3ML SOLN INHALE THE CONTENTS OF 1 VIAL VIA NEBULIZER THREE TIMES DAILY AS DIRECTED   No facility-administered encounter medications on file as of 12/03/2019.    PHYSICAL EXAM / ROS:   Current and past weights: 157.3 lbs, Weight in March 2021 = 154 lbs General: NAD, frail appearing, obese Cardiovascular: no chest pain reported, no edema  In LE (L-AKA) Pulmonary: no cough, no increased SOB, room air Abdomen: appetite fair to good,  incontinent of bowel GU: denies dysuria, incontinent of urine MSK:  ++ L-AKA, other joint and ROM abnormalities, non ambulatory Skin: no rashes or wounds reported Neurological: Weakness, dementia with behavioral disturbances, denies pain at this assessment, PAINAD 0/10.  Jason Coop, NP [facility]  COVID-19 PATIENT SCREENING TOOL  Person answering questions: ____________Staff_______ _____   1.  Is the patient or any  family member in the home showing any signs or symptoms regarding respiratory infection?               Person with Symptom- __________NA_________________  a. Fever                                                                          Yes___ No___          ___________________  b. Shortness of breath                                                    Yes___ No___          ___________________ c. Cough/congestion                                       Yes___  No___         ___________________ d. Body aches/pains                                                         Yes___ No___        ____________________ e. Gastrointestinal symptoms (diarrhea, nausea)           Yes___ No___        ____________________  2. Within the past 14 days, has anyone living in the home had any contact with someone with or under investigation for COVID-19?    Yes___ No_X_   Person __________________

## 2019-12-03 NOTE — Addendum Note (Signed)
Addended by: Estevan Oaks MCKELVEY on: 12/03/2019 05:57 PM   Modules accepted: Orders

## 2019-12-04 DIAGNOSIS — I739 Peripheral vascular disease, unspecified: Secondary | ICD-10-CM | POA: Diagnosis not present

## 2019-12-04 DIAGNOSIS — G301 Alzheimer's disease with late onset: Secondary | ICD-10-CM | POA: Diagnosis not present

## 2019-12-04 DIAGNOSIS — F329 Major depressive disorder, single episode, unspecified: Secondary | ICD-10-CM | POA: Diagnosis not present

## 2019-12-04 DIAGNOSIS — F419 Anxiety disorder, unspecified: Secondary | ICD-10-CM | POA: Diagnosis not present

## 2019-12-04 DIAGNOSIS — F0391 Unspecified dementia with behavioral disturbance: Secondary | ICD-10-CM | POA: Diagnosis not present

## 2019-12-04 DIAGNOSIS — Z89612 Acquired absence of left leg above knee: Secondary | ICD-10-CM | POA: Diagnosis not present

## 2019-12-04 DIAGNOSIS — L89304 Pressure ulcer of unspecified buttock, stage 4: Secondary | ICD-10-CM | POA: Diagnosis not present

## 2019-12-11 DIAGNOSIS — K219 Gastro-esophageal reflux disease without esophagitis: Secondary | ICD-10-CM | POA: Diagnosis not present

## 2019-12-11 DIAGNOSIS — E785 Hyperlipidemia, unspecified: Secondary | ICD-10-CM | POA: Diagnosis not present

## 2019-12-11 DIAGNOSIS — F0390 Unspecified dementia without behavioral disturbance: Secondary | ICD-10-CM | POA: Diagnosis not present

## 2019-12-11 DIAGNOSIS — I739 Peripheral vascular disease, unspecified: Secondary | ICD-10-CM | POA: Diagnosis not present

## 2019-12-11 DIAGNOSIS — Z66 Do not resuscitate: Secondary | ICD-10-CM | POA: Diagnosis not present

## 2019-12-11 DIAGNOSIS — F418 Other specified anxiety disorders: Secondary | ICD-10-CM | POA: Diagnosis not present

## 2019-12-11 DIAGNOSIS — I10 Essential (primary) hypertension: Secondary | ICD-10-CM | POA: Diagnosis not present

## 2019-12-12 DIAGNOSIS — I10 Essential (primary) hypertension: Secondary | ICD-10-CM | POA: Diagnosis not present

## 2019-12-18 DIAGNOSIS — F0391 Unspecified dementia with behavioral disturbance: Secondary | ICD-10-CM | POA: Diagnosis not present

## 2019-12-18 DIAGNOSIS — Z89612 Acquired absence of left leg above knee: Secondary | ICD-10-CM | POA: Diagnosis not present

## 2019-12-18 DIAGNOSIS — G301 Alzheimer's disease with late onset: Secondary | ICD-10-CM | POA: Diagnosis not present

## 2019-12-18 DIAGNOSIS — F329 Major depressive disorder, single episode, unspecified: Secondary | ICD-10-CM | POA: Diagnosis not present

## 2019-12-21 ENCOUNTER — Telehealth: Payer: Self-pay | Admitting: Family Medicine

## 2019-12-21 NOTE — Telephone Encounter (Signed)
Copied from [facility] [id number]. Topic: Medicare AWV >> Dec 21, 2019  9:53 AM Cher Nakai R wrote: Reason for CRM:  Left message for patient to call back and schedule the Medicare Annual Wellness Visit (AWV) in office or virtual  Last AWV 01/05/2019  Please schedule at anytime with [place].  40 minute appointment  Any questions, please contact me at [phone]

## 2019-12-31 ENCOUNTER — Other Ambulatory Visit: Payer: Medicare HMO | Admitting: Primary Care

## 2019-12-31 DIAGNOSIS — J449 Chronic obstructive pulmonary disease, unspecified: Secondary | ICD-10-CM | POA: Diagnosis not present

## 2020-01-05 DIAGNOSIS — I739 Peripheral vascular disease, unspecified: Secondary | ICD-10-CM | POA: Diagnosis not present

## 2020-01-05 DIAGNOSIS — K219 Gastro-esophageal reflux disease without esophagitis: Secondary | ICD-10-CM | POA: Diagnosis not present

## 2020-01-05 DIAGNOSIS — Z89612 Acquired absence of left leg above knee: Secondary | ICD-10-CM | POA: Diagnosis not present

## 2020-01-05 DIAGNOSIS — E785 Hyperlipidemia, unspecified: Secondary | ICD-10-CM | POA: Diagnosis not present

## 2020-01-05 DIAGNOSIS — G8929 Other chronic pain: Secondary | ICD-10-CM | POA: Diagnosis not present

## 2020-01-05 DIAGNOSIS — F0390 Unspecified dementia without behavioral disturbance: Secondary | ICD-10-CM | POA: Diagnosis not present

## 2020-01-05 DIAGNOSIS — F418 Other specified anxiety disorders: Secondary | ICD-10-CM | POA: Diagnosis not present

## 2020-01-05 DIAGNOSIS — Z9981 Dependence on supplemental oxygen: Secondary | ICD-10-CM | POA: Diagnosis not present

## 2020-01-05 DIAGNOSIS — I10 Essential (primary) hypertension: Secondary | ICD-10-CM | POA: Diagnosis not present

## 2020-01-09 ENCOUNTER — Other Ambulatory Visit: Payer: Self-pay

## 2020-01-09 ENCOUNTER — Non-Acute Institutional Stay: Payer: Medicare HMO | Admitting: Primary Care

## 2020-01-09 DIAGNOSIS — G894 Chronic pain syndrome: Secondary | ICD-10-CM

## 2020-01-09 DIAGNOSIS — G301 Alzheimer's disease with late onset: Secondary | ICD-10-CM

## 2020-01-09 DIAGNOSIS — F0281 Dementia in other diseases classified elsewhere with behavioral disturbance: Secondary | ICD-10-CM | POA: Diagnosis not present

## 2020-01-09 DIAGNOSIS — F02818 Dementia in other diseases classified elsewhere, unspecified severity, with other behavioral disturbance: Secondary | ICD-10-CM

## 2020-01-09 DIAGNOSIS — Z515 Encounter for palliative care: Secondary | ICD-10-CM | POA: Diagnosis not present

## 2020-01-09 NOTE — Progress Notes (Signed)
[facility] Consult Note Telephone: [phone]  Fax: [phone]  PATIENT NAME: Amy [name] [address] [place] [address] [phone] (home)  DOB: [birth year] MRN: [id number]  PRIMARY CARE PROVIDER:    Juluis Pitch, MD,  [address] [place] [address] [phone]  REFERRING PROVIDER:   Juluis Pitch, MD [address] [place],  [place] [address] [phone]  RESPONSIBLE PARTY:   Extended Emergency Contact Information Primary Emergency Contact: Stehlin,[name] Address: [address]          [place], [place] [address] Johnnette Litter of [place] Phone: [phone] Mobile Phone: [phone] Relation: Spouse Secondary Emergency Contact: [place], [place] Phone: [phone] Mobile Phone: [phone] Relation: Son  I met face to face with patient and family in home/facility.  ASSESSMENT AND RECOMMENDATIONS:   1. Advance Care Planning/Goals of Care: Goals include to maximize quality of life and symptom management.  Has DNR.MOST with DNR, limited measures, use of abx, use of iv, no feeding tube.   2. Symptom Management:   Nutrition: Stable weights, states she no longer is Rx for DM. Eating moderately well  Dementia: [facility] and staff states she has had some periods recently of agitation. Today she appears anxious even though husband is visiting.  She denies concerns and husband states they are still getting used to new SNF placement.  3. Follow up Palliative Care Visit: Palliative care will continue to follow for goals of care clarification and symptom management. Return 6-8 weeks or prn.  4. Family /Caregiver/Community Supports: Husband and son visit, husband at bedside today. Comes most days. Lives in [place]  5. Cognitive / Functional decline: A and O x 2, conversant, dependent in all iadls, most adls. Needs cueing.  I spent 35 minutes providing this consultation,  from 1230 to 1305. More than  50% of the time in this consultation was spent coordinating communication.   Chief Complain: Dementia with behavior disturbances.   HISTORY OF PRESENT ILLNESS:  Amy [name] is a 84 y.o. year old female with multiple medical problems including dementia, amputation, DM, chronic pain, depression. Palliative Care was asked to follow this patient by consultation request of Juluis Pitch, MD to help address advance care planning and goals of care. This is a follow up visit.  CODE STATUS: DNR PPS: 40%  HOSPICE ELIGIBILITY/DIAGNOSIS: no  PAST MEDICAL HISTORY:  Past Medical History:  Diagnosis Date  . Anemia    low iron  . Anxiety   . Arthritis   . Bilateral cataracts   . Bronchitis    hx of   . Cataract   . Chronic kidney disease   . Depression   . Diabetes mellitus without complication ([facility])   . 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 Emphysema of lung ([facility])   . GERD (gastroesophageal reflux disease)   . Headache   . History of frequent urinary tract infections   . Hx of renal cell cancer    LEFT  . Hyperlipidemia   . Hypertension   . Numbness and tingling    right hand   . Obesity   . Osteopenia 10/05/2017  . Peripheral artery disease ([facility])   . Urinary frequency   . Urinary incontinence     SOCIAL HX:  Social History   Tobacco Use  . Smoking status: Former Smoker    Packs/day: 1.50    Years:  51.00    Pack years: 76.50    Types: Cigarettes    Quit date: 05/24/2004    Years since quitting: 15.6  . Smokeless tobacco: Never Used  . Tobacco comment: smoking cessation materials not required  Substance Use Topics  . Alcohol use: Not Currently   FAMILY HX:  Family History  Problem Relation Age of Onset  . Cancer Mother        Stomach  . Cancer Father   . Diabetes Brother   . Cancer Brother        oral  . Healthy Son   . Healthy Sister     ALLERGIES:  Allergies  Allergen Reactions  . Fentanyl Other (See  Comments)    urine retention  . Penicillins Itching, Rash and Other (See Comments)    Has patient had a PCN reaction causing immediate rash, facial/tongue/throat swelling, SOB or lightheadedness with hypotension: Yes Has patient had a PCN reaction causing severe rash involving mucus membranes or skin necrosis: No Has patient had a PCN reaction that required hospitalization: No Has patient had a PCN reaction occurring within the last 10 years: Yes If all of the above answers are "NO", then may proceed with Cephalosporin use.     PERTINENT MEDICATIONS:  Outpatient Encounter Medications as of 01/09/2020  Medication Sig  . Accu-Chek Softclix Lancets lancets Use as instructed  . acetaminophen (TYLENOL) 325 MG tablet Take 325 mg by mouth every 6 (six) hours as needed.  [name] albuterol (VENTOLIN HFA) 108 (90 Base) MCG/ACT inhaler Inhale 1-2 puffs into the lungs every 4 (four) hours as needed.   . Amino Acids-Protein Hydrolys (FEEDING SUPPLEMENT, PRO-STAT SUGAR FREE 64,) LIQD Take 30 mLs by mouth daily.  [name] atorvastatin (LIPITOR) 20 MG tablet Take 1 tablet (20 mg total) by mouth daily at 6 PM.  . clopidogrel (PLAVIX) 75 MG tablet TAKE 1 TABLET EVERY DAY  . donepezil (ARICEPT) 10 MG tablet Take 1 tablet (10 mg total) by mouth at bedtime.  . famotidine (PEPCID) 20 MG tablet Take 1 tablet (20 mg total) by mouth daily.  [name] FLUoxetine (PROZAC) 10 MG capsule Take 1 capsule (10 mg total) by mouth daily.  [name] gabapentin (NEURONTIN) 300 MG capsule Take 1 capsule (300 mg total) by mouth 3 (three) times daily.  [name] HYDROcodone-acetaminophen (NORCO) 7.5-325 MG tablet Take 1 tablet by mouth every 8 (eight) hours as needed for moderate pain.  . Infant Care Products (DERMACLOUD EX) Apply 1 application topically 3 (three) times daily. To affected areas  . Melatonin 10 MG TABS Take 1 tablet by mouth at bedtime.  . OXYGEN Inhale 2 L/min into the lungs continuous. To maintain sats >92%  . pantoprazole (PROTONIX) 40 MG tablet  Take 1 tablet (40 mg total) by mouth daily.  . QUEtiapine (SEROQUEL) 25 MG tablet Take 25 mg by mouth 2 (two) times daily. Takes at 2 pm and Total 75 mg a hs.  . QUEtiapine (SEROQUEL) 50 MG tablet Take 50 mg by mouth at bedtime.  [name] therapeutic multivitamin-minerals (THERAGRAN-M) tablet Take 1 tablet by mouth daily.   No facility-administered encounter medications on file as of 01/09/2020.    PHYSICAL EXAM / ROS:   Current and past weights: stable  General: NAD, frail appearing, WNWD Cardiovascular: no chest pain reported, no LE edema  Pulmonary: no cough, no increased SOB, oxygen 2 L Abdomen: appetite fair,  incontinent of bowel GU: denies dysuria, incontinent of urine MSK:  Left AKA  joint and ROM abnormalities, non  ambulatory Skin: no rashes or wounds reported Neurological: Weakness, chronic pain, advanced dementia with behavior disturbances.  Jason Coop, NP , DNP, MPH, [facility]  COVID-19 PATIENT SCREENING TOOL  Person answering questions: ____________staff_____ _____   1.  Is the patient or any family member in the home showing any signs or symptoms regarding respiratory infection?               Person with Symptom- __________NA_________________  a. Fever                                                                          Yes___ No___          ___________________  b. Shortness of breath                                                    Yes___ No___          ___________________ c. Cough/congestion                                       Yes___  No___         ___________________ d. Body aches/pains                                                         Yes___ No___        ____________________ e. Gastrointestinal symptoms (diarrhea, nausea)           Yes___ No___        ____________________  2. Within the past 14 days, has anyone living in the home had any contact with someone with or under investigation for COVID-19?    Yes___ No_X_   Person __________________

## 2020-01-11 ENCOUNTER — Ambulatory Visit (INDEPENDENT_AMBULATORY_CARE_PROVIDER_SITE_OTHER): Payer: Medicare HMO | Admitting: Vascular Surgery

## 2020-01-11 ENCOUNTER — Other Ambulatory Visit: Payer: Self-pay

## 2020-01-11 ENCOUNTER — Ambulatory Visit (INDEPENDENT_AMBULATORY_CARE_PROVIDER_SITE_OTHER): Payer: Medicare HMO

## 2020-01-11 VITALS — BP 130/89 | HR 89 | Ht 62.0 in | Wt 150.0 lb

## 2020-01-11 DIAGNOSIS — E1122 Type 2 diabetes mellitus with diabetic chronic kidney disease: Secondary | ICD-10-CM | POA: Diagnosis not present

## 2020-01-11 DIAGNOSIS — I739 Peripheral vascular disease, unspecified: Secondary | ICD-10-CM

## 2020-01-11 DIAGNOSIS — C162 Malignant neoplasm of body of stomach: Secondary | ICD-10-CM

## 2020-01-11 DIAGNOSIS — N184 Chronic kidney disease, stage 4 (severe): Secondary | ICD-10-CM

## 2020-01-11 DIAGNOSIS — Z89612 Acquired absence of left leg above knee: Secondary | ICD-10-CM

## 2020-01-11 NOTE — Progress Notes (Signed)
MRN : [id number]  Amy [name] is a 84 y.o. ([birth year]) female who presents with chief complaint of  Chief Complaint  Patient presents with  . Follow-up    U/S follow up  .  History of Present Illness: Patient returns today in follow up of their PAD.  About 14 years ago she lost her left leg for PAD.  We have performed intervention on the right leg many years ago.  She is now in a nursing home and has had continued cognitive decline.  Her husband is also had some health issues making it harder for her to be cared for at home.  No new ulceration or infection on the legs.  She does have a sacral sore.  Her ABIs today although falsely elevated somewhat from medial calcification were 1.07 with fair waveforms.  This is actually better than her previous visit.  Current Outpatient Medications  Medication Sig Dispense Refill  . Accu-Chek Softclix Lancets lancets Use as instructed 100 each 12  . acetaminophen (OFIRMEV) 10 MG/ML SOLN     . acetaminophen (TYLENOL) 325 MG tablet Take 325 mg by mouth every 6 (six) hours as needed.    . Acetaminophen 325 MG CAPS SMARTSIG:1 Capsule(s) By Mouth Every 6 Hours PRN    . albuterol (VENTOLIN HFA) 108 (90 Base) MCG/ACT inhaler Inhale 1-2 puffs into the lungs every 4 (four) hours as needed.     . Amino Acids-Protein Hydrolys (FEEDING SUPPLEMENT, PRO-STAT SUGAR FREE 64,) LIQD Take 30 mLs by mouth daily.    [name] atorvastatin (LIPITOR) 20 MG tablet Take 1 tablet (20 mg total) by mouth daily at 6 PM. 90 tablet 3  . busPIRone (BUSPAR) 10 MG tablet Take by mouth.    . ciprofloxacin (CIPRO) 250 MG tablet Take 250 mg by mouth 2 (two) times daily.    . clopidogrel (PLAVIX) 75 MG tablet TAKE 1 TABLET EVERY DAY 90 tablet 3  . Cyanocobalamin 5000 MCG TBDP Take by mouth.    . donepezil (ARICEPT) 10 MG tablet Take 1 tablet (10 mg total) by mouth at bedtime. 90 tablet 3  . famotidine (PEPCID) 20 MG tablet Take 1 tablet (20 mg total) by mouth daily. 90 tablet 3  .  FLUoxetine (PROZAC) 10 MG capsule Take 1 capsule (10 mg total) by mouth daily. 30 capsule 0  . FLUoxetine (PROZAC) 10 MG tablet Take 10 mg by mouth daily.    [name] gabapentin (NEURONTIN) 300 MG capsule Take 1 capsule (300 mg total) by mouth 3 (three) times daily. 270 capsule 3  . HYDROcodone-acetaminophen (NORCO) 7.5-325 MG tablet Take 1 tablet by mouth every 8 (eight) hours as needed for moderate pain. 90 tablet 0  . Infant Care Products (DERMACLOUD EX) Apply 1 application topically 3 (three) times daily. To affected areas    . Melatonin 10 MG CAPS     . Melatonin 10 MG TABS Take 1 tablet by mouth at bedtime.    . Multiple Vitamins-Minerals (MULTIVITAMIN ADULT EXTRA C PO) SMARTSIG:1 Tablet(s) By Mouth Daily    . nitrofurantoin, macrocrystal-monohydrate, (MACROBID) 100 MG capsule     . OXYGEN Inhale 2 L/min into the lungs continuous. To maintain sats >92%    . pantoprazole (PROTONIX) 40 MG tablet Take 1 tablet (40 mg total) by mouth daily. 90 tablet 1  . QUEtiapine (SEROQUEL) 25 MG tablet Take 25 mg by mouth 2 (two) times daily. Takes at 2 pm and Total 75 mg a hs.    . QUEtiapine (SEROQUEL)  50 MG tablet Take 50 mg by mouth at bedtime.    [name] therapeutic multivitamin-minerals (THERAGRAN-M) tablet Take 1 tablet by mouth daily.     No current facility-administered medications for this visit.    Past Medical History:  Diagnosis Date  . Anemia    low iron  . Anxiety   . Arthritis   . Bilateral cataracts   . Bronchitis    hx of   . Cataract   . Chronic kidney disease   . Depression   . Diabetes mellitus without complication ([place])   . 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 Emphysema of lung ([place])   . GERD (gastroesophageal reflux disease)   . Headache   . History of frequent urinary tract infections   . Hx of renal cell cancer    LEFT  . Hyperlipidemia   . Hypertension   . Numbness and tingling    right hand   . Obesity   . Osteopenia  10/05/2017  . Peripheral artery disease ([place])   . Urinary frequency   . Urinary incontinence     Past Surgical History:  Procedure Laterality Date  . ABDOMINAL HYSTERECTOMY  1978  . CARPAL TUNNEL RELEASE Bilateral   . CHOLECYSTECTOMY    . COLONOSCOPY    . COLONOSCOPY WITH ESOPHAGOGASTRODUODENOSCOPY (EGD)    . ERCP N/A 08/19/2017   Procedure: ENDOSCOPIC RETROGRADE CHOLANGIOPANCREATOGRAPHY (ERCP);  Surgeon: Arta Silence, MD;  Location: [facility] ENDOSCOPY;  Service: Endoscopy;  Laterality: N/A;  . ESOPHAGOGASTRODUODENOSCOPY (EGD) WITH PROPOFOL N/A 10/12/2018   Procedure: ESOPHAGOGASTRODUODENOSCOPY (EGD) WITH PROPOFOL;  Surgeon: Virgel Manifold, MD;  Location: [facility] ENDOSCOPY;  Service: Endoscopy;  Laterality: N/A;  . LEG AMPUTATION     above the knee / left   . ROBOT ASSISTED LAPAROSCOPIC NEPHRECTOMY Left 01/15/2015   Procedure: ROBOTIC ASSISTED LAPAROSCOPIC RADICAL NEPHRECTOMY;  Surgeon: Alexis Frock, MD;  Location: [facility] ORS;  Service: Urology;  Laterality: Left;  . stent placement right leg        Social History   Tobacco Use  . Smoking status: Former Smoker    Packs/day: 1.50    Years: 51.00    Pack years: 76.50    Types: Cigarettes    Quit date: 05/24/2004    Years since quitting: 15.6  . Smokeless tobacco: Never Used  . Tobacco comment: smoking cessation materials not required  Vaping Use  . Vaping Use: Never used  Substance Use Topics  . Alcohol use: Not Currently  . Drug use: No      Family History  Problem Relation Age of Onset  . Cancer Mother        Stomach  . Cancer Father   . Diabetes Brother   . Cancer Brother        oral  . Healthy Son   . Healthy Sister      Allergies  Allergen Reactions  . Fentanyl Other (See Comments)    urine retention  . Morphine Other (See Comments)  . Penicillins Itching, Rash and Other (See Comments)    Has patient had a PCN reaction causing immediate rash, facial/tongue/throat swelling, SOB or lightheadedness with  hypotension: Yes Has patient had a PCN reaction causing severe rash involving mucus membranes or skin necrosis: No Has patient had a PCN reaction that required hospitalization: No Has patient had a PCN reaction occurring within the last 10 years: Yes If all of the above answers are "NO",  then may proceed with Cephalosporin use.     REVIEW OF SYSTEMS(Negative unless checked)  Constitutional: [] ??Weight loss [] ??Fever [] ??Chills Cardiac: [] ??Chest pain [] ??Chest pressure [] ??Palpitations [] ??Shortness of breath when laying flat [] ??Shortness of breath at rest [] ??Shortness of breath with exertion. Vascular: [] ??Pain in legs with walking [] ??Pain in legs at rest [] ??Pain in legs when laying flat [] ??Claudication [] ??Pain in feet when walking [] ??Pain in feet at rest [] ??Pain in feet when laying flat [] ??History of DVT [] ??Phlebitis [] ??Swelling in legs [] ??Varicose veins [] ??Non-healing ulcers Pulmonary: [x] ??Uses home oxygen [] ??Productive cough [] ??Hemoptysis [] ??Wheeze [x] ??COPD [] ??Asthma Neurologic: [] ??Dizziness [] ??Blackouts [] ??Seizures [] ??History of stroke [] ??History of TIA [] ??Aphasia [] ??Temporary blindness [] ??Dysphagia [] ??Weakness or numbness in arms [] ??Weakness or numbness in legsX positive for dementia Musculoskeletal: [x] ??Arthritis [] ??Joint swelling [x] ??Joint pain [] ??Low back pain Hematologic: [] ??Easy bruising [] ??Easy bleeding [] ??Hypercoagulable state [] ??Anemic  Gastrointestinal: [] ??Blood in stool [] ??Vomiting blood [x] ??Gastroesophageal reflux/heartburn [] ??Abdominal pain Genitourinary: [x] ??Chronic kidney disease [] ??Difficult urination [] ??Frequent urination [] ??Burning with urination [] ??Hematuria Skin: [] ??Rashes [] ??Ulcers [] ??Wounds Psychological: [] ??History of anxiety [] ??History of major depression.   Physical Examination  BP 130/89   Pulse 89   Ht 5\' 2"   (1.575 m)   Wt 150 lb (68 kg)   BMI 27.44 kg/m  Gen:  WD/WN, NAD.  Debilitated appearing Head: [place]/AT, No temporalis wasting. Ear/Nose/Throat: Hearing grossly intact, nares w/o erythema or drainage Eyes: Conjunctiva clear. Sclera non-icteric Neck: Supple.  Trachea midline Pulmonary:  Good air movement, no use of accessory muscles.  Cardiac: RRR, no JVD Vascular:  Vessel Right Left  Radial Palpable Palpable                          PT  not palpable  not palpable  DP  1+ palpable  not palpable    Musculoskeletal: M/S 5/5 throughout.  No deformity or atrophy.  Trace right lower extremity edema. Left AKA Neurologic: Sensation grossly intact in extremities.  Symmetrical.  Speech is fluent.  Psychiatric: Judgment intact, Mood & affect appropriate for pt's clinical situation. Dermatologic: No rashes or ulcers noted.  No cellulitis or open wounds.       Labs Recent Results (from the past 2160 hour(s))  Lipase, blood     Status: None   Collection Time: 11/09/19  5:09 PM  Result Value Ref Range   Lipase 29 11 - 51 U/L    Comment: Performed at [facility] [address] [place], [place] [address]  Comprehensive metabolic panel     Status: Abnormal   Collection Time: 11/09/19  5:09 PM  Result Value Ref Range   Sodium 136 135 - 145 mmol/L   Potassium 5.2 (H) 3.5 - 5.1 mmol/L   Chloride 104 98 - 111 mmol/L   CO2 22 22 - 32 mmol/L   Glucose, Bld 173 (H) 70 - 99 mg/dL    Comment: Glucose reference range applies only to samples taken after fasting for at least 8 hours.   BUN 28 (H) 8 - 23 mg/dL   Creatinine, Ser 1.75 (H) 0.44 - 1.00 mg/dL   Calcium 8.6 (L) 8.9 - 10.3 mg/dL   Total Protein 6.7 6.5 - 8.1 g/dL   Albumin 3.4 (L) 3.5 - 5.0 g/dL   AST 16 15 - 41 U/L   ALT 12 0 - 44 U/L   Alkaline Phosphatase 141 (H) 38 - 126 U/L   Total Bilirubin 1.0 0.3 - 1.2 mg/dL   GFR calc non Af Amer 27 (L) >60 mL/min   GFR  calc Af Amer 31 (L) >60 mL/min   Anion gap 10 5 - 15     Comment: Performed at [facility], [address] [place], [place] [address]  CBC     Status: Abnormal   Collection Time: 11/09/19  5:09 PM  Result Value Ref Range   WBC 13.3 (H) 4.0 - 10.5 K/uL   RBC 4.32 3.87 - 5.11 MIL/uL   Hemoglobin 11.7 (L) 12.0 - 15.0 g/dL   HCT 35.3 (L) 36 - 46 %   MCV 81.7 80.0 - 100.0 fL   MCH 27.1 26.0 - 34.0 pg   MCHC 33.1 30.0 - 36.0 g/dL   RDW 17.2 (H) 11.5 - 15.5 %   Platelets 233 150 - 400 K/uL   nRBC 0.0 0.0 - 0.2 %    Comment: Performed at [facility], [address] [place], [place] [address]  Urinalysis, Complete w Microscopic     Status: Abnormal   Collection Time: 11/09/19  9:28 PM  Result Value Ref Range   Color, Urine YELLOW (A) YELLOW   APPearance HAZY (A) CLEAR   Specific Gravity, Urine 1.010 1.005 - 1.030   pH 5.0 5.0 - 8.0   Glucose, UA NEGATIVE NEGATIVE mg/dL   Hgb urine dipstick LARGE (A) NEGATIVE   Bilirubin Urine NEGATIVE NEGATIVE   Ketones, ur NEGATIVE NEGATIVE mg/dL   Protein, ur 100 (A) NEGATIVE mg/dL   Nitrite NEGATIVE NEGATIVE   Leukocytes,Ua NEGATIVE NEGATIVE   RBC / HPF >50 (H) 0 - 5 RBC/hpf   WBC, UA 21-50 0 - 5 WBC/hpf   Bacteria, UA RARE (A) NONE SEEN   Squamous Epithelial / LPF NONE SEEN 0 - 5   Amorphous Crystal PRESENT     Comment: Performed at [facility], [address] [place], [place] [address]  Urine culture     Status: None   Collection Time: 11/09/19  9:28 PM   Specimen: Urine, Random  Result Value Ref Range   Specimen Description      URINE, RANDOM Performed at [facility], [address] [place], [place] [address]    Special Requests      NONE Performed at [facility], [address], [place], [place] [address]    Culture      NO GROWTH Performed at [facility], [place] [address] [place], [place] [address]    Report Status 11/11/2019 FINAL     Radiology No results found.  Assessment/Plan Diabetes mellitus type 2,  controlled blood glucose control important in reducing the progression of atherosclerotic disease. Also, involved in wound healing. On appropriate medications.   BP (high blood pressure) blood pressure control important in reducing the progression of atherosclerotic disease. On appropriate oral medications.   S/P AKA (above knee amputation) unilateral, left ([place]) About 13years ago. Stable and well healed. With her cognitive decline she is no longer able to use her prosthesis.  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  This is actually better than her previous visit. No limb threatening symptoms.  No role for intervention.  Recheck in 6 months.     Leotis Pain, MD  01/11/2020 11:33 AM    This note was created with Dragon medical transcription system.  Any errors from dictation are purely unintentional

## 2020-01-11 NOTE — Assessment & Plan Note (Addendum)
Her ABIs today although falsely elevated somewhat from medial calcification were 1.07 with fair waveforms.  This is actually better than her previous visit. No limb threatening symptoms.  No role for intervention.  Recheck in 6 months.

## 2020-01-15 DIAGNOSIS — F0391 Unspecified dementia with behavioral disturbance: Secondary | ICD-10-CM | POA: Diagnosis not present

## 2020-01-17 ENCOUNTER — Encounter: Payer: Medicare HMO | Admitting: Student in an Organized Health Care Education/Training Program

## 2020-01-29 DIAGNOSIS — F329 Major depressive disorder, single episode, unspecified: Secondary | ICD-10-CM | POA: Diagnosis not present

## 2020-01-29 DIAGNOSIS — F0151 Vascular dementia with behavioral disturbance: Secondary | ICD-10-CM | POA: Diagnosis not present

## 2020-01-29 DIAGNOSIS — G4701 Insomnia due to medical condition: Secondary | ICD-10-CM | POA: Diagnosis not present

## 2020-01-29 DIAGNOSIS — F419 Anxiety disorder, unspecified: Secondary | ICD-10-CM | POA: Diagnosis not present

## 2020-01-29 DIAGNOSIS — G301 Alzheimer's disease with late onset: Secondary | ICD-10-CM | POA: Diagnosis not present

## 2020-01-31 DIAGNOSIS — J449 Chronic obstructive pulmonary disease, unspecified: Secondary | ICD-10-CM | POA: Diagnosis not present

## 2020-02-04 DIAGNOSIS — I129 Hypertensive chronic kidney disease with stage 1 through stage 4 chronic kidney disease, or unspecified chronic kidney disease: Secondary | ICD-10-CM | POA: Diagnosis not present

## 2020-02-04 DIAGNOSIS — N2581 Secondary hyperparathyroidism of renal origin: Secondary | ICD-10-CM | POA: Diagnosis not present

## 2020-02-04 DIAGNOSIS — D631 Anemia in chronic kidney disease: Secondary | ICD-10-CM | POA: Diagnosis not present

## 2020-02-04 DIAGNOSIS — R809 Proteinuria, unspecified: Secondary | ICD-10-CM | POA: Diagnosis not present

## 2020-02-04 DIAGNOSIS — C7901 Secondary malignant neoplasm of right kidney and renal pelvis: Secondary | ICD-10-CM | POA: Diagnosis not present

## 2020-02-04 DIAGNOSIS — E1122 Type 2 diabetes mellitus with diabetic chronic kidney disease: Secondary | ICD-10-CM | POA: Diagnosis not present

## 2020-02-04 DIAGNOSIS — N184 Chronic kidney disease, stage 4 (severe): Secondary | ICD-10-CM | POA: Diagnosis not present

## 2020-02-05 DIAGNOSIS — K219 Gastro-esophageal reflux disease without esophagitis: Secondary | ICD-10-CM | POA: Diagnosis not present

## 2020-02-05 DIAGNOSIS — I10 Essential (primary) hypertension: Secondary | ICD-10-CM | POA: Diagnosis not present

## 2020-02-05 DIAGNOSIS — G8929 Other chronic pain: Secondary | ICD-10-CM | POA: Diagnosis not present

## 2020-02-05 DIAGNOSIS — F418 Other specified anxiety disorders: Secondary | ICD-10-CM | POA: Diagnosis not present

## 2020-02-05 DIAGNOSIS — F0390 Unspecified dementia without behavioral disturbance: Secondary | ICD-10-CM | POA: Diagnosis not present

## 2020-02-05 DIAGNOSIS — I739 Peripheral vascular disease, unspecified: Secondary | ICD-10-CM | POA: Diagnosis not present

## 2020-02-05 DIAGNOSIS — E785 Hyperlipidemia, unspecified: Secondary | ICD-10-CM | POA: Diagnosis not present

## 2020-02-19 NOTE — Progress Notes (Signed)
[facility]  [address] [place], [place] [address] Phone: [phone]  Fax: [phone]   Clinic Day:  02/20/2020  Referring physician: Juluis Pitch, MD  Chief Complaint: Amy [name] is a 84 y.o. female with clinical stageIgastric adenocarcinoma (2020),a history of stage III renal cell carcinoma(2016), and iron deficiency anemia who is seen for reassessment.    HPI: The patient was last seen in the medical oncology clinic on 07/04/2019. At that time, she denied any pain.  She had just completed a course of antibiotics for a UTI.  Chest CT on 03/18/2019 revealed mild growth of the expansile lytic lateral left eighth rib metastasis. There was slight growth of bilateral pulmonary nodules suspicious for pulmonary metastases.  We discussed plans for repeat chest CT on 07/10/2019.  CT was never completed.   She was seen by Dr. Juleen China on 02/04/2020.  She was noted to have stage IV chronic kidney disease with proteinuria s/p left nephrectomy.  She was noted not to want dialysis if needed.  She was not a candidate for ESA for her anemia of chronic kidney disease.  Creatinine was 1.36.  She was referred back to oncology for her renal cell carcinoma.  The patient now lives at [facility] due to progression of her dementia. She has been there for 3 months.  CBC followed: 06/25/2019: Hematocrit 36.0, hemoglobin 11.6, MCV 87.6, platelets 237,000, WBC   9,500. 11/09/2019: Hematocrit 35.3, hemoglobin 11.7, MCV 81.7, platelets 233,000, WBC 13,300. 02/04/2020: Hematocrit 36.8, hemoglobin 11.6, MCV 87.8, platelets 279,000, WBC   9,800.  During the interim, she is "not doing well." She feels sick to her stomach and has back pain. She is on 2 liters/min oxygen. Her bottom is sore and red.  She is able to eat on her own but does not prepare her own food. She wears a diaper because it takes at least two people to help her use the bathroom.   Her husband  states that her thinking is up and down. She states that today's date is April 20th, 1995. She states that Tawni Pummel is the president and she does not know who the vice president is. She knows that she is in [place] and knows her address. She is able to count backwards from 100 by ones, but not by threes. She states that four quarters are in a dollar, but does not know how many nickels are in a quarter.  The patient states that she does not want any imaging studies. She states that she "does not want to know about cancer" and wants to "wait until she has symptoms I guess." She states that she does not want dialysis. She states that she does want chest compressions, her heart shocked, and intubation if needed.   Past Medical History:  Diagnosis Date  . Anemia    low iron  . Anxiety   . Arthritis   . Bilateral cataracts   . Bronchitis    hx of   . Cataract   . Chronic kidney disease   . Depression   . Diabetes mellitus without complication ([place])   . 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 Emphysema of lung ([place])   . GERD (gastroesophageal reflux disease)   . Headache   . History of frequent urinary tract infections   . Hx of renal cell cancer    LEFT  . Hyperlipidemia   . Hypertension   .  Numbness and tingling    right hand   . Obesity   . Osteopenia 10/05/2017  . Peripheral artery disease ([place])   . Urinary frequency   . Urinary incontinence     Past Surgical History:  Procedure Laterality Date  . ABDOMINAL HYSTERECTOMY  1978  . CARPAL TUNNEL RELEASE Bilateral   . CHOLECYSTECTOMY    . COLONOSCOPY    . COLONOSCOPY WITH ESOPHAGOGASTRODUODENOSCOPY (EGD)    . ERCP N/A 08/19/2017   Procedure: ENDOSCOPIC RETROGRADE CHOLANGIOPANCREATOGRAPHY (ERCP);  Surgeon: Arta Silence, MD;  Location: [facility] ENDOSCOPY;  Service: Endoscopy;  Laterality: N/A;  . ESOPHAGOGASTRODUODENOSCOPY (EGD) WITH PROPOFOL N/A 10/12/2018   Procedure:  ESOPHAGOGASTRODUODENOSCOPY (EGD) WITH PROPOFOL;  Surgeon: Virgel Manifold, MD;  Location: [facility] ENDOSCOPY;  Service: Endoscopy;  Laterality: N/A;  . LEG AMPUTATION     above the knee / left   . ROBOT ASSISTED LAPAROSCOPIC NEPHRECTOMY Left 01/15/2015   Procedure: ROBOTIC ASSISTED LAPAROSCOPIC RADICAL NEPHRECTOMY;  Surgeon: Alexis Frock, MD;  Location: [facility] ORS;  Service: Urology;  Laterality: Left;  . stent placement right leg       Family History  Problem Relation Age of Onset  . Cancer Mother        Stomach  . Cancer Father   . Diabetes Brother   . Cancer Brother        oral  . Healthy Son   . Healthy Sister     Social History:  reports that she quit smoking about 15 years ago. Her smoking use included cigarettes. She has a 76.50 pack-year smoking history. She has never used smokeless tobacco. She reports previous alcohol use. She reports that she does not use drugs. 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 Herhusband's name isRaymond. He can be reached at [phone]. He [place] her primary historian due to her dementia. The patient is companied by her husband today.  Allergies:  Allergies  Allergen Reactions  . Fentanyl Other (See Comments)    urine retention  . Morphine Other (See Comments)  . Penicillins Itching, Rash and Other (See Comments)    Has patient had a PCN reaction causing immediate rash, facial/tongue/throat swelling, SOB or lightheadedness with hypotension: Yes Has patient had a PCN reaction causing severe rash involving mucus membranes or skin necrosis: No Has patient had a PCN reaction that required hospitalization: No Has patient had a PCN reaction occurring within the last 10 years: Yes If all of the above answers are "NO", then may proceed with Cephalosporin use.    Current Medications: Current Outpatient Medications  Medication Sig Dispense Refill  . Accu-Chek Softclix Lancets lancets Use as instructed 100 each 12  . acetaminophen (OFIRMEV) 10 MG/ML SOLN      . acetaminophen (TYLENOL) 325 MG tablet Take 325 mg by mouth every 6 (six) hours as needed.    . Acetaminophen 325 MG CAPS SMARTSIG:1 Capsule(s) By Mouth Every 6 Hours PRN    . albuterol (VENTOLIN HFA) 108 (90 Base) MCG/ACT inhaler Inhale 1-2 puffs into the lungs every 4 (four) hours as needed.     . Amino Acids-Protein Hydrolys (FEEDING SUPPLEMENT, PRO-STAT SUGAR FREE 64,) LIQD Take 30 mLs by mouth daily.    [name] atorvastatin (LIPITOR) 20 MG tablet Take 1 tablet (20 mg total) by mouth daily at 6 PM. 90 tablet 3  . busPIRone (BUSPAR) 10 MG tablet Take by mouth.    . ciprofloxacin (CIPRO) 250 MG tablet Take 250 mg by mouth 2 (two) times daily.    . clopidogrel (  PLAVIX) 75 MG tablet TAKE 1 TABLET EVERY DAY 90 tablet 3  . Cyanocobalamin 5000 MCG TBDP Take by mouth.    . donepezil (ARICEPT) 10 MG tablet Take 1 tablet (10 mg total) by mouth at bedtime. 90 tablet 3  . famotidine (PEPCID) 20 MG tablet Take 1 tablet (20 mg total) by mouth daily. 90 tablet 3  . FLUoxetine (PROZAC) 10 MG capsule Take 1 capsule (10 mg total) by mouth daily. 30 capsule 0  . FLUoxetine (PROZAC) 10 MG tablet Take 10 mg by mouth daily.    [name] gabapentin (NEURONTIN) 300 MG capsule Take 1 capsule (300 mg total) by mouth 3 (three) times daily. 270 capsule 3  . Infant Care Products (DERMACLOUD EX) Apply 1 application topically 3 (three) times daily. To affected areas    . Melatonin 10 MG CAPS     . Melatonin 10 MG TABS Take 1 tablet by mouth at bedtime.    . Multiple Vitamins-Minerals (MULTIVITAMIN ADULT EXTRA C PO) SMARTSIG:1 Tablet(s) By Mouth Daily    . nitrofurantoin, macrocrystal-monohydrate, (MACROBID) 100 MG capsule     . OXYGEN Inhale 2 L/min into the lungs continuous. To maintain sats >92%    . pantoprazole (PROTONIX) 40 MG tablet Take 1 tablet (40 mg total) by mouth daily. 90 tablet 1  . QUEtiapine (SEROQUEL) 25 MG tablet Take 25 mg by mouth 2 (two) times daily. Takes at 2 pm and Total 75 mg a hs.    . QUEtiapine  (SEROQUEL) 50 MG tablet Take 50 mg by mouth at bedtime.    [name] therapeutic multivitamin-minerals (THERAGRAN-M) tablet Take 1 tablet by mouth daily.     No current facility-administered medications for this visit.    Review of Systems  Constitutional: Negative.  Negative for chills, diaphoresis, fever, malaise/fatigue and weight loss (no new weight).       "Not doing well."  HENT: Positive for hearing loss. Negative for congestion, ear discharge, ear pain, nosebleeds, sinus pain, sore throat and tinnitus.   Eyes: Negative.  Negative for blurred vision, double vision and photophobia.  Respiratory: Positive for cough. Negative for hemoptysis, sputum production, shortness of breath and wheezing.        On 2 liter/min oxygen.  Cardiovascular: Negative.  Negative for chest pain, palpitations and leg swelling.  Gastrointestinal: Positive for diarrhea (Loose stools). Negative for abdominal pain, blood in stool, constipation, heartburn, melena, nausea and vomiting.       Feels sick to her stomach.  Genitourinary: Negative.  Negative for dysuria, frequency, hematuria and urgency.  Musculoskeletal: Positive for back pain. Negative for joint pain, myalgias and neck pain.       Phantom pain in leg.  Skin: Negative for itching and rash.       Bottom is sore and red.  Neurological: Positive for weakness (generalized). Negative for dizziness, tingling, sensory change, speech change, focal weakness and headaches.       Thinking is "off".  Endo/Heme/Allergies: Negative.  Does not bruise/bleed easily.  Psychiatric/Behavioral: Positive for memory loss (dementia). Negative for depression. The patient is not nervous/anxious and does not have insomnia.   All other systems reviewed and are negative.  Performance status (ECOG):  3  Vitals Blood pressure 105/62, pulse 96, temperature 97.6 F (36.4 C), temperature source Tympanic, resp. rate 20, SpO2 93 %.   Physical Exam Vitals and nursing note reviewed.   Constitutional:      Comments: Chronically fatigued woman sitting comfortably in a wheelchair in the exam room in  no acute distress. She is hunched over in the wheelchair.  HENT:     Head: Normocephalic and atraumatic.     Comments: Short dark hair.    Nose: Nose normal.     Mouth/Throat:     Mouth: Mucous membranes are moist.     Pharynx: Oropharynx is clear. No oropharyngeal exudate.  Eyes:     General: No scleral icterus.    Extraocular Movements: Extraocular movements intact.     Conjunctiva/sclera: Conjunctivae normal.     Pupils: Pupils are equal, round, and reactive to light.     Comments: Brown eyes.  Neck:     Vascular: No JVD.  Cardiovascular:     Rate and Rhythm: Normal rate.     Heart sounds: Normal heart sounds. No murmur heard.  No gallop.   Pulmonary:     Effort: Pulmonary effort is normal. No respiratory distress.     Breath sounds: Normal breath sounds. No wheezing or rales.  Abdominal:     General: Bowel sounds are normal. There is no distension.     Palpations: There is no mass.     Tenderness: There is no abdominal tenderness. There is no guarding or rebound.     Comments: Examined in wheelchair.  Musculoskeletal:     Cervical back: Normal range of motion and neck supple.     Left Lower Extremity: Left leg is amputated above knee.  Lymphadenopathy:     Head:     Right side of head: No preauricular, posterior auricular or occipital adenopathy.     Left side of head: No preauricular, posterior auricular or occipital adenopathy.     Cervical: No cervical adenopathy.     Upper Body:     Right upper body: No supraclavicular adenopathy.     Left upper body: No supraclavicular adenopathy.     Lower Body: No right inguinal adenopathy. No left inguinal adenopathy.  Skin:    General: Skin is warm and dry.     Coloration: Skin is not pale.     Findings: No erythema or rash.  Neurological:     Mental Status: She is alert.     Comments: States today's date is  09/10/1993. States that Tawni Pummel is Software engineer. Does not know vice president. Knows her address. Cannot count backwards from 100 by 3. States that four quarters are in a dollar. Does not know how many nickels are in a quarter. Was provided with three words and could not recall them after 10 minutes.  Psychiatric:        Behavior: Behavior normal.    Imaging studies: 11/29/2014: Abdomen and pelvic CT revealed a 5.5 x 3.7 solid enhancing mass arising from the upper pole of the left kidney. There was evidence of renal vein invasion. There were borderline enlarged periaortic lymph node (9 mm).  08/16/2016: Chest, abdomen, and pelvic CTrevealed left nephrectomy without evidence of metastatic disease. There were low-attenuation lesions in the right kidney, likely cysts although definitive characterization is limited without post-contrast imaging. There was an ectatic abdominal aorta at risk for aneurysm development.  02/16/2017: Chest, abdomen and pelvic CTrevealed no evidence of recurrent disease. There was a 7 x 5 mm right upper lobe pulmonary nodule (previously 6 x 4 mm). There was a 3 mm (previously 1-2 mm) left lower lobe pulmonary nodule. 08/12/2017: Chest CT revealed no active pulmonary disease. Mild centrilobular emphysema. There was stable appearing pleural-based nodular opacity in the anterior aspect of the right upper lobe measuring 5.6 mm.  There was stable 4 mm medial left upper lobe pleural-based opacity is unchanged.  08/12/2017: Abdomen and pelvic CTrevealed a 9 mm hyperdensity along the expected location of the distal common bile duct near the ampulla with dilatation of the CBD to 17 mm. Simple renal cysts of the right kidney were noted with a 1.1 cm hyperdense complex appearing lesion possibly representing a hemorrhagic or proteinaceous cyst. No worrisome features were identified with respect of this complex lesion.  08/14/2017: MR abdomen MRCPrevealed a solitary 9 mm  choledocholith in the mid to upper common bile duct. There was mild diffuse intrahepatic biliary ductal dilatation with dilated CBD (13 mm diameter). There was indeterminate small 1.2 cm renal cortical mass in the posterior lower right kidney, incompletely characterized on this noncontrast MRI study, renal neoplasm not excluded. Recommend attention on follow-up MRI (preferred) or CT abdomen without and with IV contrast in 6 months. There was ectatic 2.7 cm infrarenal abdominal aorta. Ectatic abdominal aorta at risk for aneurysm development. Recommend followup by ultrasound in 5 years. 02/13/2018: Chest, abdomen, and pelvic CTrevealed interval growth of 8 mm solid medial left lower lobe pulmonary nodule, cannot exclude enlarging pulmonary metastasis. This nodule was slightly below PET resolution. There was interval growth of 9 mm medial left upper lobe ground-glass pulmonary nodule. There were no additional potential findings of metastatic disease. There was no evidence of local tumor recurrence in the left nephrectomy bed. There was an ectatic 2.8 cm infrarenal abdominal aorta, stable at risk for aneurysm development.  05/26/2018: Chest CTrevealed further enlargement of part solid left upper lobe nodule, potentially adenocarcinoma. Left upper lobe measured 12 x 8 mm (previously 9 mm maximally). The left lower lobe solid nodule had not significantly changed over the last 3 months, although had enlarged over the last 10 months and could reflect a metastasis. There was new mild right paratracheal lymphadenopathy. Consider PET-CT for further evaluation. Alternatively, continued short-term CT follow-up could be performed. 08/24/2018: PET scanrevealed a2.5 cmpotential lesion in the stomach just below the GE junction has a maximum SUV of 16.2. Therewasalso some accentuated activity in left upper quadrant jejunal loop. There werea variety of mildly enlarged lymph nodes, some with activity mildly  above the background blood pool. Most of the pulmonary nodules hadlow-grade activity (benign versuslow-grade adenocarcinoma). The airspace opacity posteriorly in the aerated portion of the right lower lobe hadlow activity, probably reflecting rounded atelectasis. There wasa bandlike subpleural density in the superior segment right lower lobe whichwasmildly hypermetabolic (SUV 3.1), but with a sub solid appearance and contour atypical for malignancy.There was a small right pleural effusionthat hadaccentuated activity suggesting possible exudative effusion. There was accentuated activity in the left8thrib laterally(SUV 2.9), favorsmall healing nondisplaced rib fracture whichwas otherwise occult. 02/15/2019:Chest CTrevealed irregular masslike, pleural based opacity in the posterior right lower lobe without significant change from the most recent chest CT, but increased in size compared to the CT from02/2020. Given the persistence, infection/inflammationwas felt unlikely with neoplastic disease or rounded atelectasis the most likely etiologies. Recommend consultation with pulmonary medicine or cardiothoracic surgery if this hadnot been previously performed. There was an expansile lytic type bone lesion in the lateral left8thrib new since 06/2018, concerning for metastatic disease.There were lung nodules. Nodule in the left lower lobe, 9 mm(increased from 6 mm on the09/23/2019CT). Sub solid nodule, medial left upperlobe, 9 mm(increased from 6 mm on 02/13/2018). Pleural base nodule anterior inferior right upper lobe 11 mm(stable).There was moderate centrilobular emphysema, and coronary artery and aortic atherosclerotic calcifications. There was prominent  to mildly enlarged mediastinal lymph nodeswere stable from the most recent prior exam and without significant change from 02/13/2018. 02/28/2019:PET scanrevealed focal hypermetabolism along the posterior gastric cardia,  corresponding to the patient's known gastric cancer, improved.There was an expansile lytic lesion involving the left lateral 8th rib, compatible with metastasis.There was small pulmonary nodules in the anterior right upper lobe and medial left lower lobe, grossly unchanged in size but suspicious for metastatic disease.There weresmall mediastinal lymph nodes, mildly improved in size but suspicious for nodal metastases.There was a pleural-based lesion in the posterior right lower lobe favoredrounded atelectasis/subpleural scarring despite mild hypermetabolism.    No visits with results within 3 Day(s) from this visit.  Latest known visit with results is:  Admission on 11/09/2019, Discharged on 11/10/2019  Component Date Value Ref Range Status  . Lipase 11/09/2019 29  11 - 51 U/L Final   Performed at [facility], [address] [place], [place] [address]  . Sodium 11/09/2019 136  135 - 145 mmol/L Final  . Potassium 11/09/2019 5.2* 3.5 - 5.1 mmol/L Final  . Chloride 11/09/2019 104  98 - 111 mmol/L Final  . CO2 11/09/2019 22  22 - 32 mmol/L Final  . Glucose, Bld 11/09/2019 173* 70 - 99 mg/dL Final   Glucose reference range applies only to samples taken after fasting for at least 8 hours.  . BUN 11/09/2019 28* 8 - 23 mg/dL Final  . Creatinine, Ser 11/09/2019 1.75* 0.44 - 1.00 mg/dL Final  . Calcium 11/09/2019 8.6* 8.9 - 10.3 mg/dL Final  . Total Protein 11/09/2019 6.7  6.5 - 8.1 g/dL Final  . Albumin 11/09/2019 3.4* 3.5 - 5.0 g/dL Final  . AST 11/09/2019 16  15 - 41 U/L Final  . ALT 11/09/2019 12  0 - 44 U/L Final  . Alkaline Phosphatase 11/09/2019 141* 38 - 126 U/L Final  . Total Bilirubin 11/09/2019 1.0  0.3 - 1.2 mg/dL Final  . GFR calc non Af Amer 11/09/2019 27* >60 mL/min Final  . GFR calc Af Amer 11/09/2019 31* >60 mL/min Final  . Anion gap 11/09/2019 10  5 - 15 Final   Performed at [facility], [address] [place], [place] [address]  . WBC 11/09/2019  13.3* 4.0 - 10.5 K/uL Final  . RBC 11/09/2019 4.32  3.87 - 5.11 MIL/uL Final  . Hemoglobin 11/09/2019 11.7* 12.0 - 15.0 g/dL Final  . HCT 11/09/2019 35.3* 36 - 46 % Final  . MCV 11/09/2019 81.7  80.0 - 100.0 fL Final  . MCH 11/09/2019 27.1  26.0 - 34.0 pg Final  . MCHC 11/09/2019 33.1  30.0 - 36.0 g/dL Final  . RDW 11/09/2019 17.2* 11.5 - 15.5 % Final  . Platelets 11/09/2019 233  150 - 400 K/uL Final  . nRBC 11/09/2019 0.0  0.0 - 0.2 % Final   Performed at [facility], [address] [place], [place] [address]  . Color, Urine 11/09/2019 YELLOW* YELLOW Final  . APPearance 11/09/2019 HAZY* CLEAR Final  . Specific Gravity, Urine 11/09/2019 1.010  1.005 - 1.030 Final  . pH 11/09/2019 5.0  5.0 - 8.0 Final  . Glucose, UA 11/09/2019 NEGATIVE  NEGATIVE mg/dL Final  . Hgb urine dipstick 11/09/2019 LARGE* NEGATIVE Final  . Bilirubin Urine 11/09/2019 NEGATIVE  NEGATIVE Final  . Ketones, ur 11/09/2019 NEGATIVE  NEGATIVE mg/dL Final  . Protein, ur 11/09/2019 100* NEGATIVE mg/dL Final  . Nitrite 11/09/2019 NEGATIVE  NEGATIVE Final  . Chalmers Guest 11/09/2019 NEGATIVE  NEGATIVE  Final  . RBC / HPF 11/09/2019 >50* 0 - 5 RBC/hpf Final  . WBC, UA 11/09/2019 21-50  0 - 5 WBC/hpf Final  . Bacteria, UA 11/09/2019 RARE* NONE SEEN Final  . Squamous Epithelial / LPF 11/09/2019 NONE SEEN  0 - 5 Final  . Amorphous Crystal 11/09/2019 PRESENT   Final   Performed at [facility], [address], [place], [place] [address]  . Specimen Description 11/09/2019    Final                   Value:URINE, RANDOM Performed at [facility], [address] [place], [place] [address]   . Special Requests 11/09/2019    Final                   Value:NONE Performed at [facility], [address] [place], [place] [address]   . Culture 11/09/2019    Final                   Value:NO GROWTH Performed at [facility], [place] [address] [place], [place] [address]   . Report Status  11/09/2019 11/11/2019 FINAL   Final    Assessment:  Amy [name] is a 84 y.o. female witha history ofstage III renal cell carcinoma andclinical stage Igastric carcinoma.  She has a history of stage IIIrenal cell carcinomas/p robotic-assisted laparoscopic left radical nephrectomyon 01/09/2015. Pathology revealed a 6.3 cm renal cell carcinoma. Tumor was predominantly clear cell with a minor component of type I papillary. Fuhrman grade 3 of 4. Tumor extended into the renal vein. There was focal extension into the perirenal and sinus adipose tissue. Margins were negative. There was metastatic tumor in 2 of 2 lymph nodes. Pathologic stagewas T3aN1Mx.   PET scanon 08/24/2018 revealed a2.5 cmpotential lesion in the stomachjust below the GE junction has a maximum SUV of 16.2. There wasalso some accentuated activity in left upper quadrant jejunal loop. There werea variety of mildly enlarged lymph nodes, some with activity mildly above the background blood pool. Most of the pulmonary noduleshadlow-grade activity (benign versuslow-grade adenocarcinoma). The airspace opacity posteriorly in the aerated portion of the right lower lobe hadlow activity, probably reflecting rounded atelectasis. There wasa bandlike subpleural density in the superior segment right lower lobe whichwasmildly hypermetabolic (SUV 3.1), but with a sub solid appearance and contour atypical for malignancy.There was a small right pleural effusionthat hadaccentuated activity suggesting possible exudative effusion. There was accentuated activity in theleft eighth riblaterally (SUV 2.9), favorsmall healing nondisplaced rib fracture whichwas otherwise occult.  EGDon 10/12/2018 revealed normal gastroesophageal junction and esophagus. There was a gastric tumorin the cardia. There was erythematous mucosa in the antrum.Examined duodenum was normal. Pathologyfrom the cardiarevealed adenocarcinoma,  moderately differentiated intestinal type, at least intramucosal, and intestinal metaplasia in the stomach mass. There was also antral and oxyntic mucosa with mild chronic inactive gastritis, negative for H pylori, intestinal metaplasia, dysplasia, or malignancy.  [facility]2018 revealed a mass along the greater curvature of the proximal gastric body.Clinical stagewas T2N0. The mass was not amenable to endoscopic removal. Pathology revealed at leastintramucosal adenocarcinoma arising in a background of intestinal metaplasia and low- and high-grade dysplasia.  Shereceived palliative gastricradiationfrom 11/22/2018 - 12/28/2018.  PET scanon 02/28/2019 revealed focal hypermetabolism along the posterior gastric cardia, corresponding to the patient's known gastric cancer, improved.There was an expansile lytic lesion involving the left lateral 8th rib, compatible with metastasis.There was small pulmonary nodulesin the anterior right upper lobe  and medial left lower lobe, grossly unchanged in size but suspicious for metastatic disease. There weresmall mediastinal lymph nodes, mildly improved in size but suspicious for nodal metastases.There was a pleural-based lesion in the posterior right lower lobe favoredrounded atelectasis/subpleural scarring despite mild hypermetabolism.   She received palliative radiation to the 8th rib with 2000 cGy in 5 fractions (04/02/2019 - 04/06/2019).  Chest CT on 03/18/2019 revealed mild growth of the expansile lytic lateral left eighth rib metastasis. There was slight growth of bilateral pulmonary nodules suspicious for pulmonary metastases. There was stable mild mediastinal lymphadenopathy.  She underwentERCPon 08/19/2017 for chloedocholithiasis. One pancreatic stent was placed into the ventral pancreatic duct. A filling defect consistent with a stone was seen on the cholangiogram. The entire main bile duct and common hepatic duct were  moderately dilated. Choledocholithiasis was found. Complete removal was accomplished by biliary sphincterotomy and basket extraction.  She has iron deficiency anemia. Ferritin was 18 on 08/10/2016, 22 on 09/07/2016, 69 on 12/07/2016, 81 on 02/23/2017, 67 on 08/24/2017, 50 on 02/03/2018, 22 on 03/01/2018, 25 on 05/31/2018, and 15 on 02/22/2019.She denies pica. She is on oral iron.  She has B12 deficiencyand folate deficiency. B12was 298 on 02/22/2019. She is on oral B12. Folatewas 5.5 on 10/24/2018 and 27.0 on 02/22/2019. She is on folic acid.  She has chronic renal insufficiency. Creatinine was 1.86 (CrCl 26 ml/min) on 01/23/2018and 1.59 on 10/27/2018.CrCl is 26 ml/min.  She has a history of left lower extremity DVT. She is s/p left AKA.  Symptomatically, her performance status is poor.  She requires assistance with her ADLs.  He is not interested in evaluation for malignancy.  Plan: 1.   Review labs on 02/04/2020. 2.   Clinicalstage Igastric carcinoma She is s/ppalliative radiation completed on08/10/2018. PET scan on 02/28/2019 revealed focal metabolism along the posterior gastric cardia, improved.             Patient currently declines reevaluation. 3. Stage III renal cell carcinoma  Sheis s/pleft radical nephrectomyon 01/09/2015. Patient had high risk disease. PET scan on 02/28/2019 revealed small pulmonary nodules, small mediastinal nodules, and lytic lesion of 8th rib suspicious for malignancy.                         Lesions may represent renal cell carcinoma.                         Unable to safely biopsy rib lesion.  She received palliatve radiation (completed 04/06/2019).  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 There was slight growth of bilateral pulmonary nodules suspicious for pulmonary metastases.  Discuss  consideration of imaging studies for reevaluation- patient delcines.    Patient not interested in dialysis if needed in the future. 4. Lytic lesion of left 8th rib             She received palliative radiation to the 8th rib with 2000 cGy in 5 fractions (04/02/2019 - 04/06/2019).  Patient declines reevaluation. 5.   Pulmonary nodules PET scan on 02/28/2019 revealed small pulmonary nodules.             Patient is at high risk for bronchoscopy.             Chest CT in 02/2019 noted growth of nodules.  Patient declines imaging. 6. Iron deficiency Hematocrit 36.0. Hemoglobin 11.6. MCV87.6on 06/25/2019.  Hematocrit 35.3.  Hemoglobin  11.7.  MCV 81.7 on 11/09/2019.  Hematocrit 36.8.  Hemoglobin 11.6.  MCV 87.8 on 02/04/2020. Continue to monitor. 7.   Dementia  Patient resides at [facility].  Mentation waxes and wanes. 8.   Code status  Patient wishes full code. 9.   RTC prn.  I discussed the assessment and treatment plan with the patient.  The patient was provided an opportunity to ask questions and all were answered.  The patient agreed with the plan and demonstrated an understanding of the instructions.  The patient was advised to call back if the symptoms worsen or if the condition fails to improve as anticipated.   I provided 19 minutes of face-to-face time during this this encounter and > 50% was spent counseling as documented under my assessment and plan.  An additional 10-15 minutes were spent reviewing her chart (Epic and Care Everywhere) including notes, labs, and imaging studies.      Marylen Ponto, am acting as Education administrator for [employer]. Mike Gip, MD, PhD.  I, Melissa C. Mike Gip, MD, have reviewed the above documentation for accuracy and completeness, and I agree with the above.

## 2020-02-20 ENCOUNTER — Encounter: Payer: Self-pay | Admitting: Hematology and Oncology

## 2020-02-20 ENCOUNTER — Inpatient Hospital Stay: Payer: Medicare HMO | Attending: Hematology and Oncology | Admitting: Hematology and Oncology

## 2020-02-20 ENCOUNTER — Other Ambulatory Visit: Payer: Self-pay

## 2020-02-20 VITALS — BP 105/62 | HR 96 | Temp 97.6°F | Resp 20

## 2020-02-20 DIAGNOSIS — Z7189 Other specified counseling: Secondary | ICD-10-CM

## 2020-02-20 DIAGNOSIS — F0390 Unspecified dementia without behavioral disturbance: Secondary | ICD-10-CM | POA: Diagnosis not present

## 2020-02-20 DIAGNOSIS — Z87891 Personal history of nicotine dependence: Secondary | ICD-10-CM | POA: Insufficient documentation

## 2020-02-20 DIAGNOSIS — C642 Malignant neoplasm of left kidney, except renal pelvis: Secondary | ICD-10-CM

## 2020-02-20 DIAGNOSIS — C162 Malignant neoplasm of body of stomach: Secondary | ICD-10-CM

## 2020-02-20 DIAGNOSIS — D509 Iron deficiency anemia, unspecified: Secondary | ICD-10-CM | POA: Diagnosis not present

## 2020-02-20 DIAGNOSIS — C7800 Secondary malignant neoplasm of unspecified lung: Secondary | ICD-10-CM | POA: Diagnosis not present

## 2020-02-20 DIAGNOSIS — C169 Malignant neoplasm of stomach, unspecified: Secondary | ICD-10-CM | POA: Insufficient documentation

## 2020-02-20 DIAGNOSIS — C7951 Secondary malignant neoplasm of bone: Secondary | ICD-10-CM | POA: Diagnosis not present

## 2020-02-20 DIAGNOSIS — Z923 Personal history of irradiation: Secondary | ICD-10-CM | POA: Diagnosis not present

## 2020-02-20 NOTE — Progress Notes (Signed)
Patient here for oncology follow-up appointment, expresses complaints of "feeling sick", loose stools and numbness in fingers.

## 2020-02-26 DIAGNOSIS — Z23 Encounter for immunization: Secondary | ICD-10-CM | POA: Diagnosis not present

## 2020-03-01 DIAGNOSIS — J449 Chronic obstructive pulmonary disease, unspecified: Secondary | ICD-10-CM | POA: Diagnosis not present

## 2020-03-04 DIAGNOSIS — G301 Alzheimer's disease with late onset: Secondary | ICD-10-CM | POA: Diagnosis not present

## 2020-03-04 DIAGNOSIS — F331 Major depressive disorder, recurrent, moderate: Secondary | ICD-10-CM | POA: Diagnosis not present

## 2020-03-04 DIAGNOSIS — R062 Wheezing: Secondary | ICD-10-CM | POA: Diagnosis not present

## 2020-03-04 DIAGNOSIS — F0151 Vascular dementia with behavioral disturbance: Secondary | ICD-10-CM | POA: Diagnosis not present

## 2020-03-04 DIAGNOSIS — G4701 Insomnia due to medical condition: Secondary | ICD-10-CM | POA: Diagnosis not present

## 2020-03-04 DIAGNOSIS — R1312 Dysphagia, oropharyngeal phase: Secondary | ICD-10-CM | POA: Diagnosis not present

## 2020-03-05 DIAGNOSIS — F0390 Unspecified dementia without behavioral disturbance: Secondary | ICD-10-CM | POA: Diagnosis not present

## 2020-03-05 DIAGNOSIS — I739 Peripheral vascular disease, unspecified: Secondary | ICD-10-CM | POA: Diagnosis not present

## 2020-03-05 DIAGNOSIS — E785 Hyperlipidemia, unspecified: Secondary | ICD-10-CM | POA: Diagnosis not present

## 2020-03-05 DIAGNOSIS — G301 Alzheimer's disease with late onset: Secondary | ICD-10-CM | POA: Diagnosis not present

## 2020-03-05 DIAGNOSIS — F418 Other specified anxiety disorders: Secondary | ICD-10-CM | POA: Diagnosis not present

## 2020-03-05 DIAGNOSIS — Z66 Do not resuscitate: Secondary | ICD-10-CM | POA: Diagnosis not present

## 2020-03-05 DIAGNOSIS — K219 Gastro-esophageal reflux disease without esophagitis: Secondary | ICD-10-CM | POA: Diagnosis not present

## 2020-03-05 DIAGNOSIS — I10 Essential (primary) hypertension: Secondary | ICD-10-CM | POA: Diagnosis not present

## 2020-03-05 DIAGNOSIS — R1312 Dysphagia, oropharyngeal phase: Secondary | ICD-10-CM | POA: Diagnosis not present

## 2020-03-05 DIAGNOSIS — J40 Bronchitis, not specified as acute or chronic: Secondary | ICD-10-CM | POA: Diagnosis not present

## 2020-03-06 DIAGNOSIS — G301 Alzheimer's disease with late onset: Secondary | ICD-10-CM | POA: Diagnosis not present

## 2020-03-06 DIAGNOSIS — R1312 Dysphagia, oropharyngeal phase: Secondary | ICD-10-CM | POA: Diagnosis not present

## 2020-03-07 DIAGNOSIS — G301 Alzheimer's disease with late onset: Secondary | ICD-10-CM | POA: Diagnosis not present

## 2020-03-07 DIAGNOSIS — R1312 Dysphagia, oropharyngeal phase: Secondary | ICD-10-CM | POA: Diagnosis not present

## 2020-03-10 DIAGNOSIS — G301 Alzheimer's disease with late onset: Secondary | ICD-10-CM | POA: Diagnosis not present

## 2020-03-10 DIAGNOSIS — R1312 Dysphagia, oropharyngeal phase: Secondary | ICD-10-CM | POA: Diagnosis not present

## 2020-03-11 DIAGNOSIS — R1312 Dysphagia, oropharyngeal phase: Secondary | ICD-10-CM | POA: Diagnosis not present

## 2020-03-11 DIAGNOSIS — G301 Alzheimer's disease with late onset: Secondary | ICD-10-CM | POA: Diagnosis not present

## 2020-03-12 DIAGNOSIS — R1312 Dysphagia, oropharyngeal phase: Secondary | ICD-10-CM | POA: Diagnosis not present

## 2020-03-12 DIAGNOSIS — G301 Alzheimer's disease with late onset: Secondary | ICD-10-CM | POA: Diagnosis not present

## 2020-03-13 ENCOUNTER — Ambulatory Visit: Payer: Self-pay | Admitting: *Deleted

## 2020-03-13 DIAGNOSIS — G301 Alzheimer's disease with late onset: Secondary | ICD-10-CM | POA: Diagnosis not present

## 2020-03-13 DIAGNOSIS — R1312 Dysphagia, oropharyngeal phase: Secondary | ICD-10-CM | POA: Diagnosis not present

## 2020-03-13 NOTE — Chronic Care Management (AMB) (Signed)
CCM status changed to previously enrolled.  Sandeep Delagarza, LCSW Clinical Social Worker  [facility] Care Management [phone]  

## 2020-03-14 DIAGNOSIS — R1312 Dysphagia, oropharyngeal phase: Secondary | ICD-10-CM | POA: Diagnosis not present

## 2020-03-14 DIAGNOSIS — G301 Alzheimer's disease with late onset: Secondary | ICD-10-CM | POA: Diagnosis not present

## 2020-03-17 DIAGNOSIS — R1312 Dysphagia, oropharyngeal phase: Secondary | ICD-10-CM | POA: Diagnosis not present

## 2020-03-17 DIAGNOSIS — G301 Alzheimer's disease with late onset: Secondary | ICD-10-CM | POA: Diagnosis not present

## 2020-03-25 ENCOUNTER — Other Ambulatory Visit: Payer: Self-pay

## 2020-03-25 ENCOUNTER — Non-Acute Institutional Stay: Payer: Medicare HMO | Admitting: Primary Care

## 2020-03-25 DIAGNOSIS — F02818 Dementia in other diseases classified elsewhere, unspecified severity, with other behavioral disturbance: Secondary | ICD-10-CM

## 2020-03-25 DIAGNOSIS — G301 Alzheimer's disease with late onset: Secondary | ICD-10-CM

## 2020-03-25 DIAGNOSIS — F0281 Dementia in other diseases classified elsewhere with behavioral disturbance: Secondary | ICD-10-CM | POA: Diagnosis not present

## 2020-03-25 DIAGNOSIS — Z515 Encounter for palliative care: Secondary | ICD-10-CM | POA: Diagnosis not present

## 2020-03-25 DIAGNOSIS — J441 Chronic obstructive pulmonary disease with (acute) exacerbation: Secondary | ICD-10-CM

## 2020-03-25 DIAGNOSIS — G894 Chronic pain syndrome: Secondary | ICD-10-CM | POA: Diagnosis not present

## 2020-03-25 NOTE — Progress Notes (Signed)
Amy [name] Consult Note Telephone: [phone]  Fax: [phone]  PATIENT NAME: Amy [name] [address] [place] [address] [phone] (home)  DOB: 1936-02-11 MRN: [id number]  PRIMARY CARE PROVIDER:    Rica Koyanagi, MD [address] [place],  [place] [address] [phone]  REFERRING PROVIDER:   Rica Koyanagi, MD [address] [place],  [place] [address]  RESPONSIBLE PARTY:   Extended Emergency Contact Information Primary Emergency Contact: Amy [name] Address: [address]          [place], [place] [address] Johnnette Litter of [place] Phone: [phone] Mobile Phone: [phone] Relation: Spouse Secondary Emergency Contact: Amy [name] Phone: [phone] Mobile Phone: [phone] Relation: Son  I met face to face with patient and family in facility.   ASSESSMENT AND RECOMMENDATIONS:   1. Advance Care Planning/Goals of Care: Goals include to maximize quality of life and symptom management. Has DNR.MOST with DNR, limited measures, use of abx, use of iv, no feeding tube.   Husband is POA  No changes to MOST form  2. Symptom Management:   I met with Amy [name] and her husband in her skilled facility room. She was a little drowsy this evening. Mr. Welby has brought her takeout from a fish house, which she usually enjoys. Tonight she does not seem to have much appetite. Staff states that she eats better when her food comes from a local restaurants and that her weight has been maintained between a 154 and 157 for several months. This was historically also her weight when I followed her at her home.   Mr. Bessent states she has good days and bad days, meaning more or less cooperative. Staff also states that frequently at night she calls out and is agitated. This also is continuing behavior from when she was at her home. I would recommend continued attempts to help her get nocturnal sleep and not sleep in  the day. Otherwise she appears at her baseline and her family does not have any changes in their stated goals of care.  3. Follow up Palliative Care Visit: Palliative care will continue to follow for goals of care clarification and symptom management. Return 6-8 weeks or prn.  4. Family /Caregiver/Community Supports: Husband is POA, has a son. Lives in [place].  5. Cognitive / Functional decline: A and O x 1-2, agitated at times. Unable to get oob. Dependent in all adls and iadls.  I spent 35 minutes providing this consultation,  from 1600 to 1635. More than 50% of the time in this consultation was spent coordinating communication.   CHIEF COMPLAINT: debility, anxiety  HISTORY OF PRESENT ILLNESS:  Amy [name] is a 84 y.o. year old female  with DM, dementia, agitation . We are asked to consult around sx management and goals of care.    Palliative Care was asked to follow this patient by consultation request of Amy Pitch, MD to help address advance care planning and goals of care. This is a follow up visit.  CODE STATUS: DNR PPS: 40%  HOSPICE ELIGIBILITY/DIAGNOSIS: TBD  ROS  General: NAD EYES:  wears glasses ENMT: denies dysphagia Cardiovascular: denies chest pain Pulmonary: denies  cough, denies increased SOB,  Abdomen: endorses fair appetite, endorses occ  constipation, endorses incontinence of bowel GU: denies dysuria, endorses incontinence of urine MSK:  endorses ROM limitations, no falls reported, L AKA Skin: denies rashes or wounds Neurological: endorses weakness,endorses  pain, endorses insomnia, nocturnal agitation Psych: Endorses distressed  mood  Physical Exam: Current and past weights:157 lbs, stable Constitutional: , NAD General :frail appearing, tWNWD EYES: anicteric sclera,EOMI, lids intact, no discharge  ENMT: intact hearing,oral mucous membranes moist CV:  no LE edema Pulmonary:  no increased work of breathing, no cough, no audible wheezes, oxygen at  3 l  Abdomen: intake 50%, no ascites GU: deferred MSK: mod sacropenia, decreased ROM in all extremities, L AKA, no contractures of LE, non ambulatory Skin: warm and dry, no rashes or wounds on visible skin Neuro: Weakness, severe cognitive impairment, grossly non -focal Psych: anxious affect, A and O x 1-2   CURRENT PROBLEM LIST:  Patient Active Problem List   Diagnosis Date Noted  . Chronic rhinitis 10/11/2019  . Anemia in chronic kidney disease 07/15/2019  . Stage 3b chronic kidney disease ([place]) 07/15/2019  . Type 2 diabetes mellitus with diabetic chronic kidney disease ([place]) 07/15/2019  . Benign hypertensive kidney disease with chronic kidney disease 07/15/2019  . Bone metastasis ([place]) 03/13/2019  . Rib lesion 03/08/2019  . Right lower lobe pulmonary nodule 02/08/2019  . Cancer of intra-abdominal lymph nodes ([place]) 01/19/2019  . Genital labial ulcer 12/06/2018  . B12 deficiency 11/05/2018  . Folate deficiency 11/05/2018  . Gastric cancer ([place])   . Stomach irritation   . Obesity (BMI 30.0-34.9) 09/11/2018  . Pulmonary nodules 08/27/2018  . Goals of care, counseling/discussion   . Palliative care by specialist   . Hypoxia 07/26/2018  . HCAP (healthcare-associated pneumonia) 07/02/2018  . Hypoxemia   . Renal cell carcinoma ([place])   . Lung nodule 04/23/2018  . Hypertensive renal disease 11/08/2017  . Secondary hyperparathyroidism of renal origin ([place]) 11/08/2017  . QT prolongation 10/27/2017  . Osteopenia 10/05/2017  . Choledocholithiasis 08/13/2017  . Chronic pain syndrome 04/05/2017  . Depression 11/15/2016  . Iron deficiency anemia 09/08/2016  . Anemia 06/29/2016  . Late onset Alzheimer's disease with behavioral disturbance ([place]) 06/14/2016  . S/P AKA (above knee amputation) unilateral, left ([place]) 06/08/2016  . Wheelchair dependent 03/11/2016  . 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 ([place]) 03/13/2015  . S/p nephrectomy 01/29/2015  . Renal neoplasm 01/15/2015    . Abnormal EKG 01/02/2015  . Renal mass 12/05/2014  . GERD (gastroesophageal reflux disease) 11/20/2014  . GAD (generalized anxiety disorder) 11/20/2014  . AB (asthmatic bronchitis) 11/20/2014  . Aortic atherosclerosis ([place]) 11/20/2014  . Cataract 11/20/2014  . Leg pain 11/20/2014  . Continuous opioid dependence ([place]) 11/20/2014  . CKD (chronic kidney disease) stage 4, GFR 15-29 ml/min (HCC) 11/20/2014  . COPD (chronic obstructive pulmonary disease) ([place]) 11/20/2014  . Excessive falling 11/20/2014  . Fatty infiltration of liver 11/20/2014  . Hyperlipidemia 11/20/2014  . Anterior knee pain 11/20/2014  . LBP (low back pain) 11/20/2014  . Malaise and fatigue 11/20/2014  . Abnormal presence of protein in urine 11/20/2014  . PVD (peripheral vascular disease) ([place]) 11/20/2014  . Allergic rhinitis 11/20/2014  . At risk for falling 11/20/2014  . Anxiety 11/05/2014  . Elevated serum creatinine 11/05/2014  . Diabetes mellitus type 2, controlled ([place]) 11/05/2014  . H/O adenomatous polyp of colon 07/27/2010   PAST MEDICAL HISTORY:  Active Ambulatory Problems    Diagnosis Date Noted  . Anxiety 11/05/2014  . Elevated serum creatinine 11/05/2014  . Diabetes mellitus type 2, controlled ([place]) 11/05/2014  . GERD (gastroesophageal reflux disease) 11/20/2014  . GAD (generalized anxiety disorder) 11/20/2014  . AB (asthmatic bronchitis) 11/20/2014  . Aortic atherosclerosis ([name]) 11/20/2014  . Cataract 11/20/2014  .  Leg pain 11/20/2014  . Continuous opioid dependence ([place]) 11/20/2014  . CKD (chronic kidney disease) stage 4, GFR 15-29 ml/min (HCC) 11/20/2014  . COPD (chronic obstructive pulmonary disease) ([place]) 11/20/2014  . Excessive falling 11/20/2014  . Fatty infiltration of liver 11/20/2014  . H/O adenomatous polyp of colon 07/27/2010  . Hyperlipidemia 11/20/2014  . Anterior knee pain 11/20/2014  . LBP (low back pain) 11/20/2014  . Malaise and fatigue 11/20/2014  . Abnormal presence of  protein in urine 11/20/2014  . PVD (peripheral vascular disease) ([place]) 11/20/2014  . Allergic rhinitis 11/20/2014  . At risk for falling 11/20/2014  . Renal mass 12/05/2014  . Abnormal EKG 01/02/2015  . Renal neoplasm 01/15/2015  . S/p nephrectomy 01/29/2015  . 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 ([place]) 03/13/2015  . Wheelchair dependent 03/11/2016  . S/P AKA (above knee amputation) unilateral, left ([place]) 06/08/2016  . Anemia 06/29/2016  . Iron deficiency anemia 09/08/2016  . Depression 11/15/2016  . Chronic pain syndrome 04/05/2017  . Late onset Alzheimer's disease with behavioral disturbance ([place]) 06/14/2016  . Choledocholithiasis 08/13/2017  . Osteopenia 10/05/2017  . QT prolongation 10/27/2017  . Hypertensive renal disease 11/08/2017  . Secondary hyperparathyroidism of renal origin ([place]) 11/08/2017  . Lung nodule 04/23/2018  . HCAP (healthcare-associated pneumonia) 07/02/2018  . Hypoxemia   . Renal cell carcinoma ([place])   . Hypoxia 07/26/2018  . Goals of care, counseling/discussion   . Palliative care by specialist   . Pulmonary nodules 08/27/2018  . Obesity (BMI 30.0-34.9) 09/11/2018  . Gastric cancer ([place])   . Stomach irritation   . B12 deficiency 11/05/2018  . Folate deficiency 11/05/2018  . Genital labial ulcer 12/06/2018  . Cancer of intra-abdominal lymph nodes ([place]) 01/19/2019  . Right lower lobe pulmonary nodule 02/08/2019  . Rib lesion 03/08/2019  . Bone metastasis ([place]) 03/13/2019  . Chronic rhinitis 10/11/2019  . Anemia in chronic kidney disease 07/15/2019  . Stage 3b chronic kidney disease ([place]) 07/15/2019  . Type 2 diabetes mellitus with diabetic chronic kidney disease ([place]) 07/15/2019  . Benign hypertensive kidney disease with chronic kidney disease 07/15/2019   Resolved Ambulatory Problems    Diagnosis Date Noted  . Phantom limb syndrome with pain ([place]) 11/05/2014  . Acute urinary tract infection 11/20/2014  . Acute vulvovaginitis  11/20/2014  . Abnormal mental state 11/20/2014  . Congestion of respiratory tract 11/20/2014  . Brash 11/20/2014  . Difficult or painful urination 11/20/2014  . Encounter for general adult medical examination without abnormal findings 11/20/2014  . Gravida 1 11/20/2014  . HTN (hypertension) 11/20/2014  . Flu vaccine need 11/20/2014  . Candida glabrata infection 11/20/2014  . Kidney lump 11/20/2014  . Low blood pressure reading 11/20/2014  . OP (osteoporosis) 11/20/2014  . Parity 1 11/20/2014  . FLS (phantom limb syndrome) 11/20/2014  . Need for vaccination 11/20/2014  . Groin pain 11/20/2014  . Screening for gout 11/20/2014  . Change in blood platelet count 11/20/2014  . Urgency of micturation 11/20/2014  . UTI (urinary tract infection) 11/20/2014  . Avitaminosis D 11/20/2014  . Preoperative clearance 01/02/2015  . Pre-operative cardiovascular examination 01/07/2015  . Need for immunization against influenza 01/29/2015  . Sore throat 05/20/2015  . Weight loss 07/17/2016  . Diarrhea in adult patient 12/15/2016  . COPD exacerbation ([place])   . Abnormal gastrointestinal PET scan 08/27/2018  . Abnormal finding on GI tract imaging    Past Medical History:  Diagnosis Date  . Arthritis   . Bilateral cataracts   .  Bronchitis   . Chronic kidney disease   . Diabetes mellitus without complication ([place])   . DVT (deep venous thrombosis) ([place])   . Emphysema of lung ([place])   . Headache   . History of frequent urinary tract infections   . Hx of renal cell cancer   . Hypertension   . Numbness and tingling   . Obesity   . Peripheral artery disease ([place])   . Urinary frequency   . Urinary incontinence   ,  Past Medical History:  Diagnosis Date  . Anemia    low iron  . Anxiety   . Arthritis   . Bilateral cataracts   . Bronchitis    hx of   . Cataract   . Chronic kidney disease   . Depression   . Diabetes mellitus without complication ([place])   . DVT (deep venous thrombosis)  (HCC)    hx of in left leg currently has left AKA   . Emphysema of lung ([place])   . GERD (gastroesophageal reflux disease)   . Headache   . History of frequent urinary tract infections   . Hx of renal cell cancer    LEFT  . Hyperlipidemia   . Hypertension   . Numbness and tingling    right hand   . Obesity   . Osteopenia 10/05/2017  . Peripheral artery disease ([place])   . Urinary frequency   . Urinary incontinence    SOCIAL HX:  Social History   Tobacco Use  . Smoking status: Former Smoker    Packs/day: 1.50    Years: 51.00    Pack years: 76.50    Types: Cigarettes    Quit date: 05/24/2004    Years since quitting: 15.8  . Smokeless tobacco: Never Used  . Tobacco comment: smoking cessation materials not required  Substance Use Topics  . Alcohol use: Not Currently   FAMILY HX:  Family History  Problem Relation Age of Onset  . Cancer Mother        Stomach  . Cancer Father   . Diabetes Brother   . Cancer Brother        oral  . Healthy Son   . Healthy Sister     ALLERGIES:  Allergies  Allergen Reactions  . Fentanyl Other (See Comments)    urine retention  . Morphine Other (See Comments)  . Penicillins Itching, Rash and Other (See Comments)    Has patient had a PCN reaction causing immediate rash, facial/tongue/throat swelling, SOB or lightheadedness with hypotension: Yes Has patient had a PCN reaction causing severe rash involving mucus membranes or skin necrosis: No Has patient had a PCN reaction that required hospitalization: No Has patient had a PCN reaction occurring within the last 10 years: Yes If all of the above answers are "NO", then may proceed with Cephalosporin use.     PERTINENT MEDICATIONS:  Outpatient Encounter Medications as of 03/25/2020  Medication Sig  . Accu-Chek Softclix Lancets lancets Use as instructed (Patient not taking: Reported on 02/20/2020)  . acetaminophen (OFIRMEV) 10 MG/ML SOLN  (Patient not taking: Reported on 02/20/2020)  .  acetaminophen (TYLENOL) 325 MG tablet Take 325 mg by mouth every 6 (six) hours as needed.  . Acetaminophen 325 MG CAPS SMARTSIG:1 Capsule(s) By Mouth Every 6 Hours PRN  . albuterol (VENTOLIN HFA) 108 (90 Base) MCG/ACT inhaler Inhale 1-2 puffs into the lungs every 4 (four) hours as needed.   . Amino Acids-Protein Hydrolys (FEEDING SUPPLEMENT, PRO-STAT SUGAR FREE 64,)  LIQD Take 30 mLs by mouth daily.  [name] atorvastatin (LIPITOR) 20 MG tablet Take 1 tablet (20 mg total) by mouth daily at 6 PM.  . busPIRone (BUSPAR) 10 MG tablet Take by mouth. (Patient not taking: Reported on 02/20/2020)  . ciprofloxacin (CIPRO) 250 MG tablet Take 250 mg by mouth 2 (two) times daily. (Patient not taking: Reported on 02/20/2020)  . clopidogrel (PLAVIX) 75 MG tablet TAKE 1 TABLET EVERY DAY  . Cyanocobalamin 5000 MCG TBDP Take by mouth. (Patient not taking: Reported on 02/20/2020)  . donepezil (ARICEPT) 10 MG tablet Take 1 tablet (10 mg total) by mouth at bedtime.  . famotidine (PEPCID) 20 MG tablet Take 1 tablet (20 mg total) by mouth daily.  [name] FLUoxetine (PROZAC) 10 MG capsule Take 1 capsule (10 mg total) by mouth daily.  [name] FLUoxetine (PROZAC) 10 MG tablet Take 10 mg by mouth daily.  [name] gabapentin (NEURONTIN) 300 MG capsule Take 1 capsule (300 mg total) by mouth 3 (three) times daily.  . Infant Care Products (DERMACLOUD EX) Apply 1 application topically 3 (three) times daily. To affected areas (Patient not taking: Reported on 02/20/2020)  . Melatonin 10 MG CAPS  (Patient not taking: Reported on 02/20/2020)  . Melatonin 10 MG TABS Take 1 tablet by mouth at bedtime.  . Multiple Vitamins-Minerals (MULTIVITAMIN ADULT EXTRA C PO) SMARTSIG:1 Tablet(s) By Mouth Daily  . nitrofurantoin, macrocrystal-monohydrate, (MACROBID) 100 MG capsule  (Patient not taking: Reported on 02/20/2020)  . OXYGEN Inhale 2 L/min into the lungs continuous. To maintain sats >92%  . pantoprazole (PROTONIX) 40 MG tablet Take 1 tablet (40 mg total) by mouth  daily.  . QUEtiapine (SEROQUEL) 25 MG tablet Take 25 mg by mouth 2 (two) times daily. Takes at 2 pm and Total 75 mg a hs.  . QUEtiapine (SEROQUEL) 50 MG tablet Take 50 mg by mouth at bedtime.  [name] therapeutic multivitamin-minerals (THERAGRAN-M) tablet Take 1 tablet by mouth daily. (Patient not taking: Reported on 02/20/2020)  . traMADol (ULTRAM) 50 MG tablet  (Patient not taking: Reported on 02/20/2020)  . ZINC OXIDE, TOPICAL, 10 % CREA Apply topically. Apply to buttocks   No facility-administered encounter medications on file as of 03/25/2020.     Eliezer Lofts, NP , DNP, MPH, AGPCNP-BC, ACHPN  COVID-19 PATIENT SCREENING TOOL  Person answering questions: ____________staff______ _____   1.  Is the patient or any family member in the home showing any signs or symptoms regarding respiratory infection?               Person with Symptom- __________NA_________________  a. Fever                                                                          Yes___ No___          ___________________  b. Shortness of breath                                                    Yes___ No___          ___________________ c. Cough/congestion  Yes___  No___         ___________________ d. Body aches/pains                                                         Yes___ No___        ____________________ e. Gastrointestinal symptoms (diarrhea, nausea)           Yes___ No___        ____________________  2. Within the past 14 days, has anyone living in the home had any contact with someone with or under investigation for COVID-19?    Yes___ No_X_   Person __________________   

## 2020-04-01 DIAGNOSIS — J449 Chronic obstructive pulmonary disease, unspecified: Secondary | ICD-10-CM | POA: Diagnosis not present

## 2020-04-07 DIAGNOSIS — F321 Major depressive disorder, single episode, moderate: Secondary | ICD-10-CM | POA: Diagnosis not present

## 2020-04-07 DIAGNOSIS — N184 Chronic kidney disease, stage 4 (severe): Secondary | ICD-10-CM | POA: Diagnosis not present

## 2020-04-07 DIAGNOSIS — R918 Other nonspecific abnormal finding of lung field: Secondary | ICD-10-CM | POA: Diagnosis not present

## 2020-04-07 DIAGNOSIS — K2100 Gastro-esophageal reflux disease with esophagitis, without bleeding: Secondary | ICD-10-CM | POA: Diagnosis not present

## 2020-04-07 DIAGNOSIS — R109 Unspecified abdominal pain: Secondary | ICD-10-CM | POA: Diagnosis not present

## 2020-04-07 DIAGNOSIS — F0151 Vascular dementia with behavioral disturbance: Secondary | ICD-10-CM | POA: Diagnosis not present

## 2020-04-07 DIAGNOSIS — K5900 Constipation, unspecified: Secondary | ICD-10-CM | POA: Diagnosis not present

## 2020-04-08 DIAGNOSIS — K5900 Constipation, unspecified: Secondary | ICD-10-CM | POA: Diagnosis not present

## 2020-04-08 DIAGNOSIS — F321 Major depressive disorder, single episode, moderate: Secondary | ICD-10-CM | POA: Diagnosis not present

## 2020-04-08 DIAGNOSIS — J189 Pneumonia, unspecified organism: Secondary | ICD-10-CM | POA: Diagnosis not present

## 2020-04-08 DIAGNOSIS — F0151 Vascular dementia with behavioral disturbance: Secondary | ICD-10-CM | POA: Diagnosis not present

## 2020-04-08 DIAGNOSIS — K2100 Gastro-esophageal reflux disease with esophagitis, without bleeding: Secondary | ICD-10-CM | POA: Diagnosis not present

## 2020-04-10 DIAGNOSIS — J159 Unspecified bacterial pneumonia: Secondary | ICD-10-CM | POA: Diagnosis not present

## 2020-04-11 DIAGNOSIS — N184 Chronic kidney disease, stage 4 (severe): Secondary | ICD-10-CM | POA: Diagnosis not present

## 2020-04-14 DIAGNOSIS — R1312 Dysphagia, oropharyngeal phase: Secondary | ICD-10-CM | POA: Diagnosis not present

## 2020-04-14 DIAGNOSIS — K2100 Gastro-esophageal reflux disease with esophagitis, without bleeding: Secondary | ICD-10-CM | POA: Diagnosis not present

## 2020-04-14 DIAGNOSIS — N1831 Chronic kidney disease, stage 3a: Secondary | ICD-10-CM | POA: Diagnosis not present

## 2020-04-14 DIAGNOSIS — I129 Hypertensive chronic kidney disease with stage 1 through stage 4 chronic kidney disease, or unspecified chronic kidney disease: Secondary | ICD-10-CM | POA: Diagnosis not present

## 2020-04-14 DIAGNOSIS — E785 Hyperlipidemia, unspecified: Secondary | ICD-10-CM | POA: Diagnosis not present

## 2020-04-14 DIAGNOSIS — I10 Essential (primary) hypertension: Secondary | ICD-10-CM | POA: Diagnosis not present

## 2020-04-14 DIAGNOSIS — E1122 Type 2 diabetes mellitus with diabetic chronic kidney disease: Secondary | ICD-10-CM | POA: Diagnosis not present

## 2020-04-14 DIAGNOSIS — F0390 Unspecified dementia without behavioral disturbance: Secondary | ICD-10-CM | POA: Diagnosis not present

## 2020-04-14 DIAGNOSIS — J449 Chronic obstructive pulmonary disease, unspecified: Secondary | ICD-10-CM | POA: Diagnosis not present

## 2020-04-14 DIAGNOSIS — G301 Alzheimer's disease with late onset: Secondary | ICD-10-CM | POA: Diagnosis not present

## 2020-04-22 DIAGNOSIS — R1312 Dysphagia, oropharyngeal phase: Secondary | ICD-10-CM | POA: Diagnosis not present

## 2020-04-22 DIAGNOSIS — G301 Alzheimer's disease with late onset: Secondary | ICD-10-CM | POA: Diagnosis not present

## 2020-04-23 DIAGNOSIS — G301 Alzheimer's disease with late onset: Secondary | ICD-10-CM | POA: Diagnosis not present

## 2020-04-23 DIAGNOSIS — R1312 Dysphagia, oropharyngeal phase: Secondary | ICD-10-CM | POA: Diagnosis not present

## 2020-04-24 DIAGNOSIS — G301 Alzheimer's disease with late onset: Secondary | ICD-10-CM | POA: Diagnosis not present

## 2020-04-24 DIAGNOSIS — R1312 Dysphagia, oropharyngeal phase: Secondary | ICD-10-CM | POA: Diagnosis not present

## 2020-04-25 DIAGNOSIS — G301 Alzheimer's disease with late onset: Secondary | ICD-10-CM | POA: Diagnosis not present

## 2020-04-25 DIAGNOSIS — R1312 Dysphagia, oropharyngeal phase: Secondary | ICD-10-CM | POA: Diagnosis not present

## 2020-04-28 DIAGNOSIS — I129 Hypertensive chronic kidney disease with stage 1 through stage 4 chronic kidney disease, or unspecified chronic kidney disease: Secondary | ICD-10-CM | POA: Diagnosis not present

## 2020-04-28 DIAGNOSIS — R1312 Dysphagia, oropharyngeal phase: Secondary | ICD-10-CM | POA: Diagnosis not present

## 2020-04-28 DIAGNOSIS — G301 Alzheimer's disease with late onset: Secondary | ICD-10-CM | POA: Diagnosis not present

## 2020-04-28 DIAGNOSIS — D631 Anemia in chronic kidney disease: Secondary | ICD-10-CM | POA: Diagnosis not present

## 2020-04-28 DIAGNOSIS — N2581 Secondary hyperparathyroidism of renal origin: Secondary | ICD-10-CM | POA: Diagnosis not present

## 2020-04-28 DIAGNOSIS — R809 Proteinuria, unspecified: Secondary | ICD-10-CM | POA: Diagnosis not present

## 2020-04-28 DIAGNOSIS — N184 Chronic kidney disease, stage 4 (severe): Secondary | ICD-10-CM | POA: Diagnosis not present

## 2020-04-28 DIAGNOSIS — E1122 Type 2 diabetes mellitus with diabetic chronic kidney disease: Secondary | ICD-10-CM | POA: Diagnosis not present

## 2020-04-29 DIAGNOSIS — R1312 Dysphagia, oropharyngeal phase: Secondary | ICD-10-CM | POA: Diagnosis not present

## 2020-04-29 DIAGNOSIS — G4701 Insomnia due to medical condition: Secondary | ICD-10-CM | POA: Diagnosis not present

## 2020-04-29 DIAGNOSIS — F419 Anxiety disorder, unspecified: Secondary | ICD-10-CM | POA: Diagnosis not present

## 2020-04-29 DIAGNOSIS — F331 Major depressive disorder, recurrent, moderate: Secondary | ICD-10-CM | POA: Diagnosis not present

## 2020-04-29 DIAGNOSIS — F0151 Vascular dementia with behavioral disturbance: Secondary | ICD-10-CM | POA: Diagnosis not present

## 2020-04-29 DIAGNOSIS — G301 Alzheimer's disease with late onset: Secondary | ICD-10-CM | POA: Diagnosis not present

## 2020-04-30 DIAGNOSIS — G301 Alzheimer's disease with late onset: Secondary | ICD-10-CM | POA: Diagnosis not present

## 2020-04-30 DIAGNOSIS — R1312 Dysphagia, oropharyngeal phase: Secondary | ICD-10-CM | POA: Diagnosis not present

## 2020-05-01 DIAGNOSIS — G301 Alzheimer's disease with late onset: Secondary | ICD-10-CM | POA: Diagnosis not present

## 2020-05-01 DIAGNOSIS — J449 Chronic obstructive pulmonary disease, unspecified: Secondary | ICD-10-CM | POA: Diagnosis not present

## 2020-05-01 DIAGNOSIS — R1312 Dysphagia, oropharyngeal phase: Secondary | ICD-10-CM | POA: Diagnosis not present

## 2020-05-02 DIAGNOSIS — G301 Alzheimer's disease with late onset: Secondary | ICD-10-CM | POA: Diagnosis not present

## 2020-05-02 DIAGNOSIS — R1312 Dysphagia, oropharyngeal phase: Secondary | ICD-10-CM | POA: Diagnosis not present

## 2020-05-05 DIAGNOSIS — J449 Chronic obstructive pulmonary disease, unspecified: Secondary | ICD-10-CM | POA: Diagnosis not present

## 2020-05-05 DIAGNOSIS — K2100 Gastro-esophageal reflux disease with esophagitis, without bleeding: Secondary | ICD-10-CM | POA: Diagnosis not present

## 2020-05-05 DIAGNOSIS — E1122 Type 2 diabetes mellitus with diabetic chronic kidney disease: Secondary | ICD-10-CM | POA: Diagnosis not present

## 2020-05-05 DIAGNOSIS — E785 Hyperlipidemia, unspecified: Secondary | ICD-10-CM | POA: Diagnosis not present

## 2020-05-05 DIAGNOSIS — L242 Irritant contact dermatitis due to solvents: Secondary | ICD-10-CM | POA: Diagnosis not present

## 2020-05-22 DIAGNOSIS — R051 Acute cough: Secondary | ICD-10-CM | POA: Diagnosis not present

## 2020-05-22 DIAGNOSIS — R918 Other nonspecific abnormal finding of lung field: Secondary | ICD-10-CM | POA: Diagnosis not present

## 2020-05-22 DIAGNOSIS — U071 COVID-19: Secondary | ICD-10-CM | POA: Diagnosis not present

## 2020-05-26 DIAGNOSIS — J159 Unspecified bacterial pneumonia: Secondary | ICD-10-CM | POA: Diagnosis not present

## 2020-05-26 DIAGNOSIS — E1122 Type 2 diabetes mellitus with diabetic chronic kidney disease: Secondary | ICD-10-CM | POA: Diagnosis not present

## 2020-05-26 DIAGNOSIS — R059 Cough, unspecified: Secondary | ICD-10-CM | POA: Diagnosis not present

## 2020-05-26 DIAGNOSIS — J449 Chronic obstructive pulmonary disease, unspecified: Secondary | ICD-10-CM | POA: Diagnosis not present

## 2020-06-01 DIAGNOSIS — J449 Chronic obstructive pulmonary disease, unspecified: Secondary | ICD-10-CM | POA: Diagnosis not present

## 2020-06-04 DIAGNOSIS — J159 Unspecified bacterial pneumonia: Secondary | ICD-10-CM | POA: Diagnosis not present

## 2020-06-04 DIAGNOSIS — R0689 Other abnormalities of breathing: Secondary | ICD-10-CM | POA: Diagnosis not present

## 2020-06-04 DIAGNOSIS — R638 Other symptoms and signs concerning food and fluid intake: Secondary | ICD-10-CM | POA: Diagnosis not present

## 2020-06-04 DIAGNOSIS — R051 Acute cough: Secondary | ICD-10-CM | POA: Diagnosis not present

## 2020-06-04 DIAGNOSIS — E1122 Type 2 diabetes mellitus with diabetic chronic kidney disease: Secondary | ICD-10-CM | POA: Diagnosis not present

## 2020-06-04 DIAGNOSIS — U071 COVID-19: Secondary | ICD-10-CM | POA: Diagnosis not present

## 2020-06-04 DIAGNOSIS — I10 Essential (primary) hypertension: Secondary | ICD-10-CM | POA: Diagnosis not present

## 2020-06-04 DIAGNOSIS — Z8616 Personal history of COVID-19: Secondary | ICD-10-CM | POA: Diagnosis not present

## 2020-06-05 DIAGNOSIS — E869 Volume depletion, unspecified: Secondary | ICD-10-CM | POA: Diagnosis not present

## 2020-06-05 DIAGNOSIS — R109 Unspecified abdominal pain: Secondary | ICD-10-CM | POA: Diagnosis not present

## 2020-06-05 DIAGNOSIS — R051 Acute cough: Secondary | ICD-10-CM | POA: Diagnosis not present

## 2020-06-05 DIAGNOSIS — J159 Unspecified bacterial pneumonia: Secondary | ICD-10-CM | POA: Diagnosis not present

## 2020-06-05 DIAGNOSIS — R638 Other symptoms and signs concerning food and fluid intake: Secondary | ICD-10-CM | POA: Diagnosis not present

## 2020-06-05 DIAGNOSIS — Z8616 Personal history of COVID-19: Secondary | ICD-10-CM | POA: Diagnosis not present

## 2020-06-06 DIAGNOSIS — F0151 Vascular dementia with behavioral disturbance: Secondary | ICD-10-CM | POA: Diagnosis not present

## 2020-06-06 DIAGNOSIS — N390 Urinary tract infection, site not specified: Secondary | ICD-10-CM | POA: Diagnosis not present

## 2020-06-06 DIAGNOSIS — U071 COVID-19: Secondary | ICD-10-CM | POA: Diagnosis not present

## 2020-06-06 DIAGNOSIS — G301 Alzheimer's disease with late onset: Secondary | ICD-10-CM | POA: Diagnosis not present

## 2020-06-13 ENCOUNTER — Encounter: Payer: Self-pay | Admitting: Primary Care

## 2020-06-13 NOTE — Progress Notes (Signed)
Nursing home apprised Palliative NP that patient passed away on 06-24-20.

## 2020-06-24 DEATH — deceased

## 2020-07-15 ENCOUNTER — Encounter (INDEPENDENT_AMBULATORY_CARE_PROVIDER_SITE_OTHER): Payer: Medicare HMO

## 2020-07-15 ENCOUNTER — Ambulatory Visit (INDEPENDENT_AMBULATORY_CARE_PROVIDER_SITE_OTHER): Payer: Medicare HMO | Admitting: Vascular Surgery

## 2020-10-31 ENCOUNTER — Ambulatory Visit: Payer: Self-pay | Admitting: Urology

## 2021-02-09 IMAGING — CR CHEST - 2 VIEW
1 series · 2 of 2 positions shown · non-contrast
Comparison: 11/17/2018

CLINICAL DATA: Dyspnea hypoxia.  Left rib pain post fall

EXAM:
CHEST - 2 VIEW

[Series 1: dg chest 2 view · 0.14mm/px · 2 of 2 slices shown]
[im 1/2]
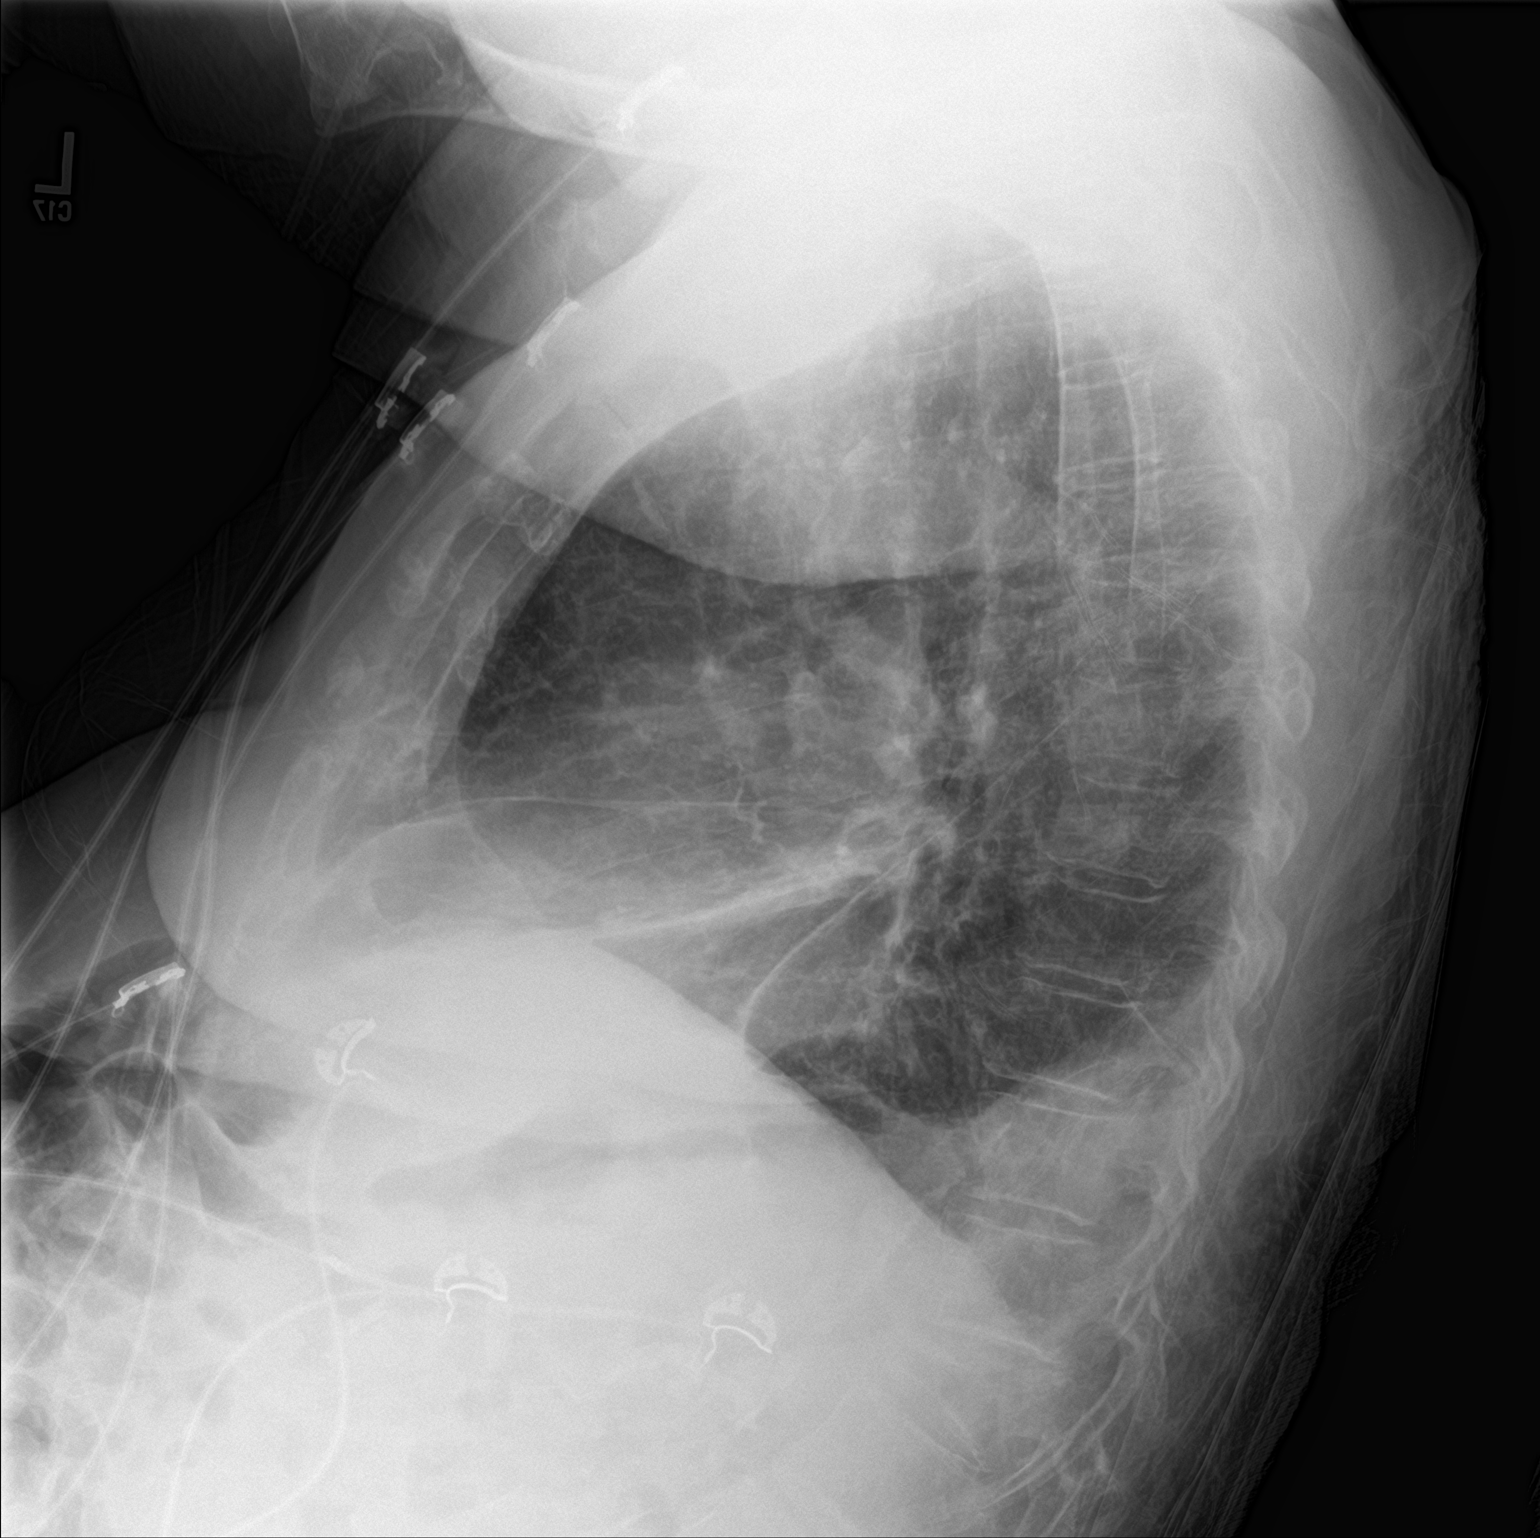
[im 2/2]
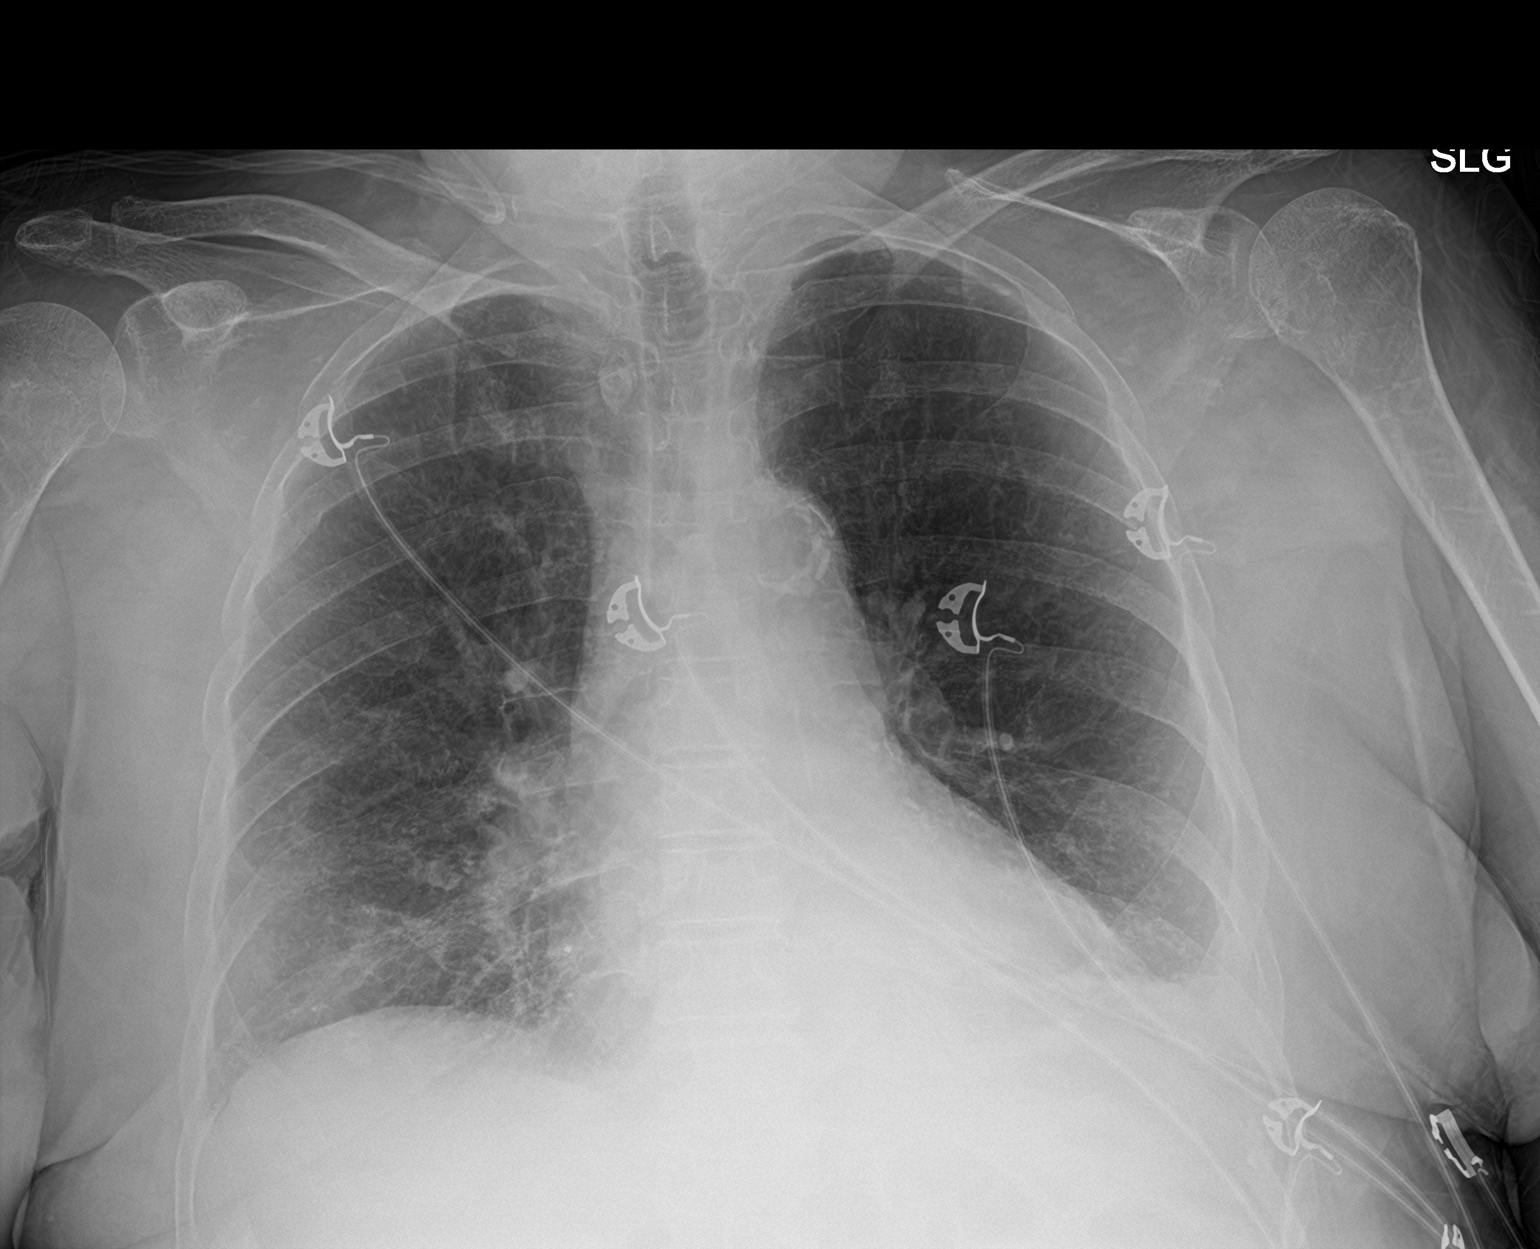

[2 of 2 positions shown; findings below may reference images not displayed]

FINDINGS: Cardiac enlargement without heart failure or edema. Atherosclerotic
aorta. Progression of small left effusion with mild left lower lobe
atelectasis. Chronic compression fracture approximately L1 is
unchanged.
IMPRESSION: Progression of left pleural effusion and left lower lobe
atelectasis. Negative for edema.

## 2021-02-09 IMAGING — CT CT HEAD WITHOUT CONTRAST
5 of 8 series · 16 of 47 positions shown, 17 images · non-contrast
Comparison: None.

CLINICAL DATA: For head injury after fall out of wheelchair.
Uncertain loss of consciousness.

EXAM:
CT HEAD WITHOUT CONTRAST
CT CERVICAL SPINE WITHOUT CONTRAST
TECHNIQUE: Multidetector CT imaging of the head and cervical spine was
performed following the standard protocol without intravenous
contrast. Multiplanar CT image reconstructions of the cervical spine
were also generated.

[Series 4: head wo · axial · 0.47mm/px · z∈[+1338,+1383]mm · 2 of 28 slices shown, 3 images]
[im 10/28  brain]
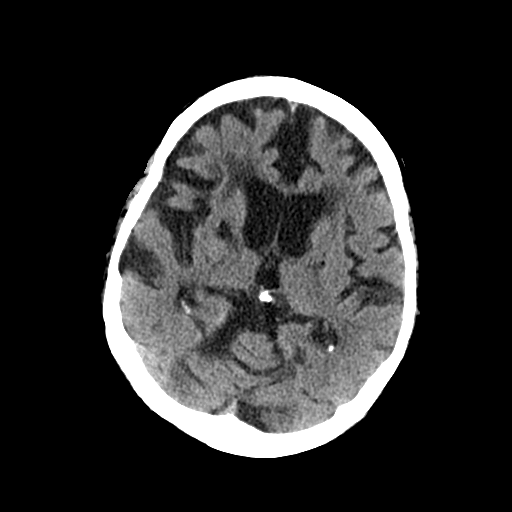
[im 10/28  bone]
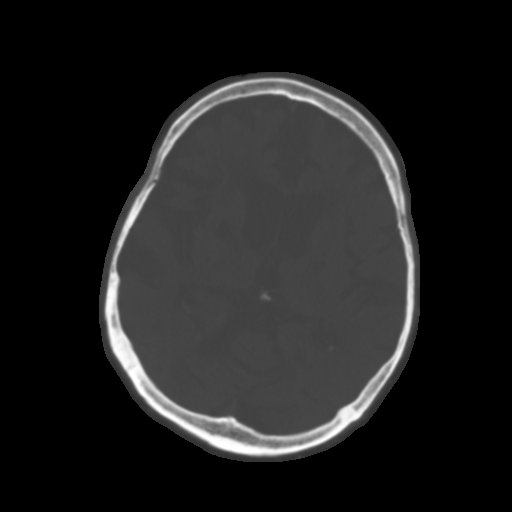
[im 19/28  brain]
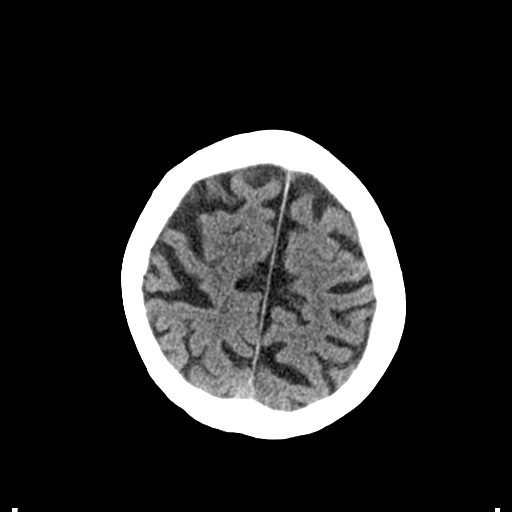

[Series 5: coronal soft tissue · coronal · 0.27mm/px · 3 of 63 slices shown]
[im 21/63  brain]
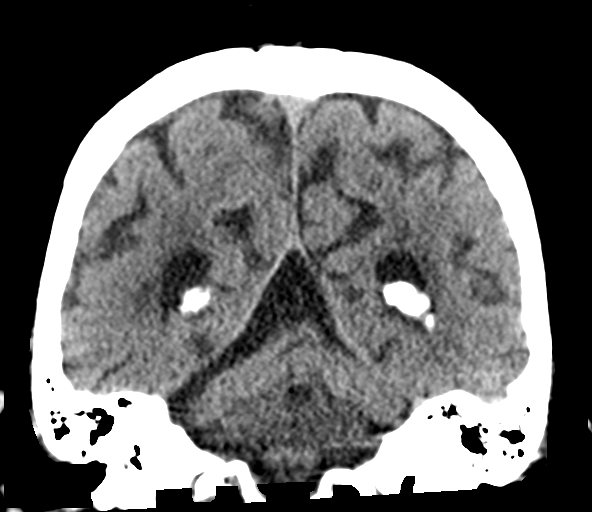
[im 32/63  brain]
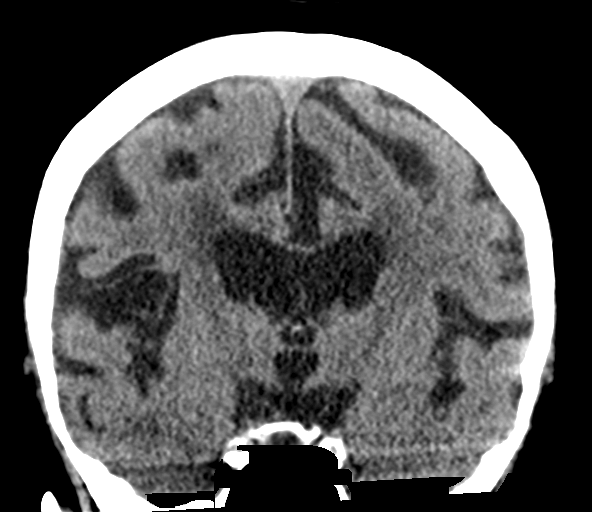
[im 42/63  brain]
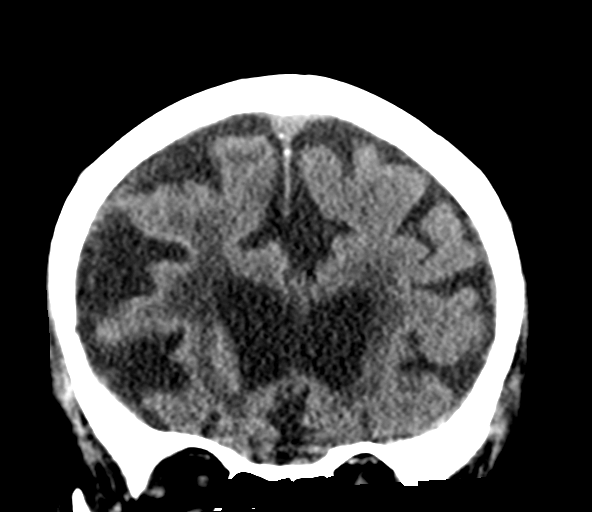

[Series 8: sagittal soft tissue · sagittal · 0.27mm/px · 1 of 54 slices shown]
[im 27/54  brain]
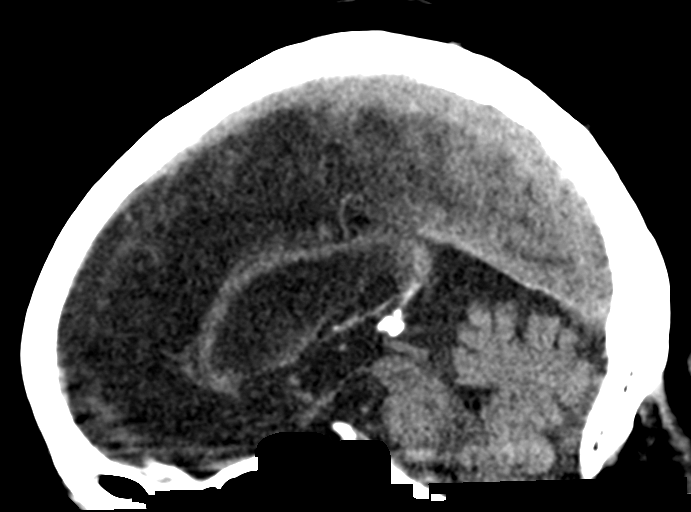

[Series 9: c spine soft · axial · 0.40mm/px · z∈[+1160,+1176]mm · 2 of 77 slices shown]
[im 8/77  brain]
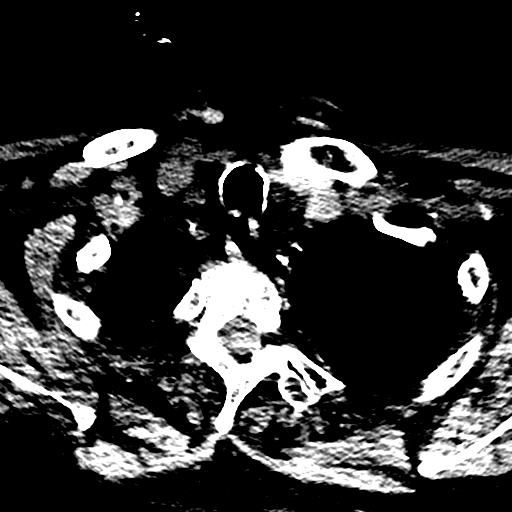
[im 16/77  brain]
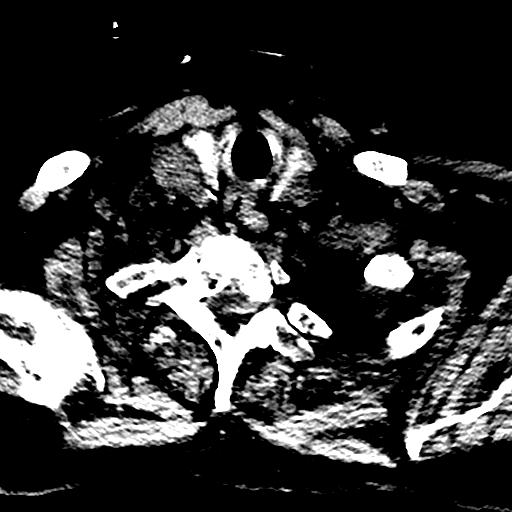

[Series 12: orthogonal bone · axial · 0.26mm/px · z∈[+1118,+1256]mm · 8 of 99 slices shown]
[im 8/99  bone]
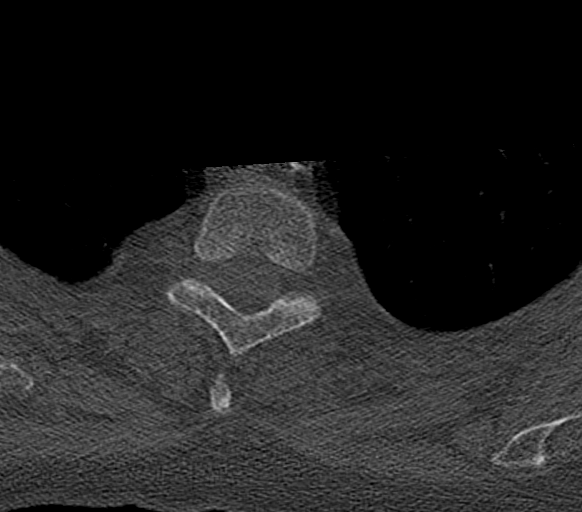
[im 23/99  bone]
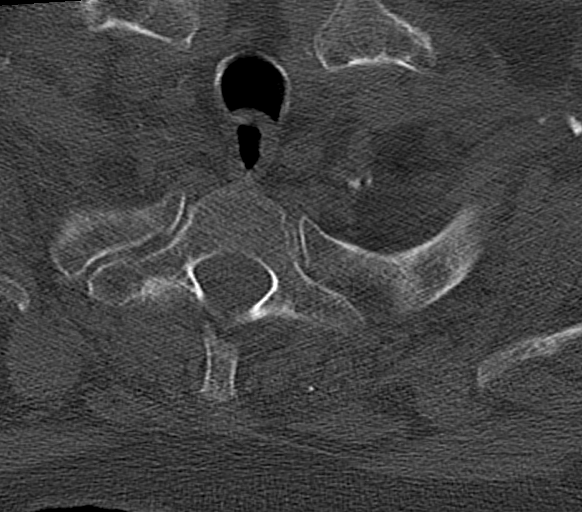
[im 31/99  bone]
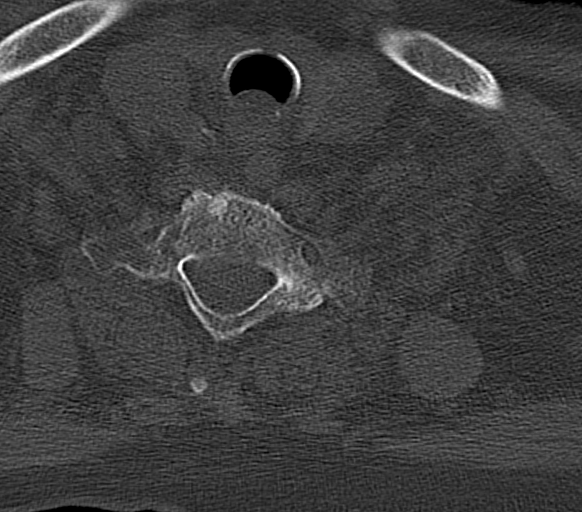
[im 46/99  bone]
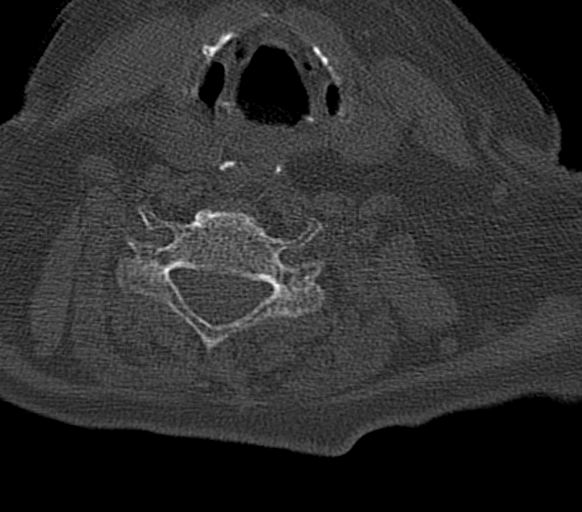
[im 53/99  bone]
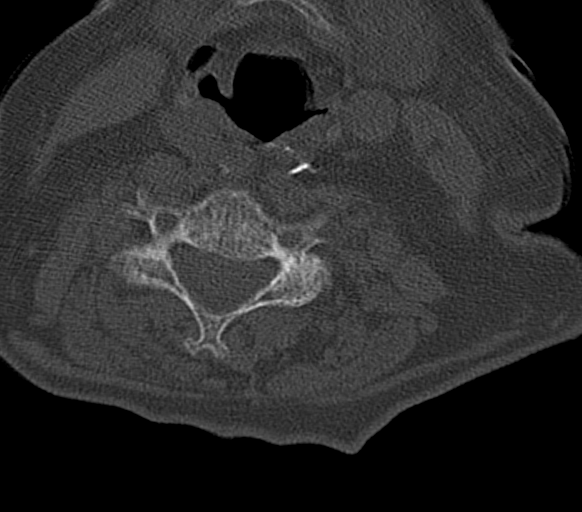
[im 68/99  bone]
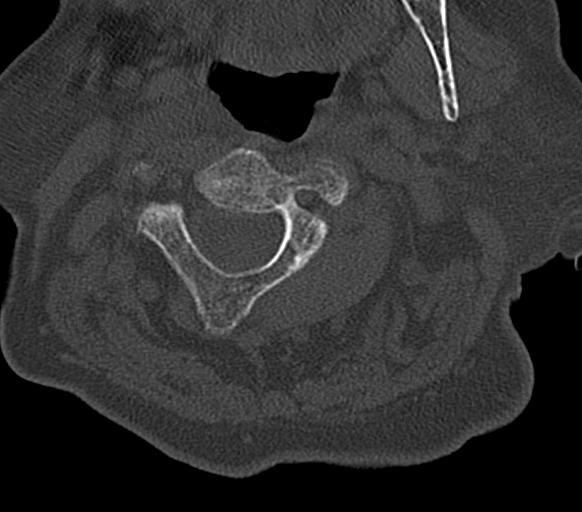
[im 76/99  bone]
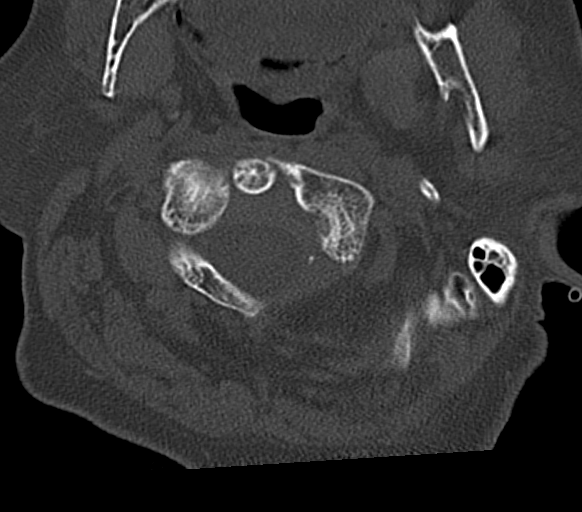
[im 91/99  bone]
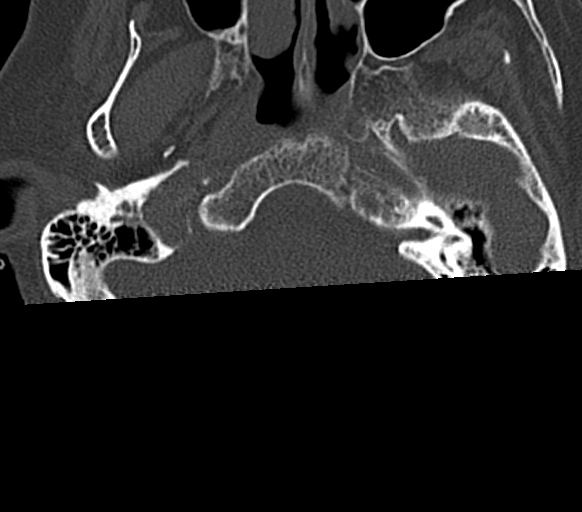

[16 of 47 positions shown; findings below may reference images not displayed]

FINDINGS: CT HEAD FINDINGS

Brain: Mild diffuse cortical atrophy is noted. Mild chronic ischemic
white matter disease is noted. Old right basal ganglia infarction is
noted. No mass effect or midline shift is noted. Ventricular size is
within normal limits. There is no evidence of mass lesion,
hemorrhage or acute infarction.

Vascular: No hyperdense vessel or unexpected calcification.

Skull: Normal. Negative for fracture or focal lesion.

Sinuses/Orbits: No acute finding.

Other: None.

CT CERVICAL SPINE FINDINGS

Alignment: Grade 1 anterolisthesis of C3-4 and C4-5 is noted
secondary to posterior facet joint hypertrophy.

Skull base and vertebrae: No acute fracture. No primary bone lesion
or focal pathologic process.

Soft tissues and spinal canal: No prevertebral fluid or swelling. No
visible canal hematoma.

Disc levels: Severe degenerative disc disease is noted at C5-6 and
C6-7 with anterior osteophyte formation.

Upper chest: Negative.

Other: Degenerative changes are seen involving posterior facet
joints bilaterally.
IMPRESSION: Mild diffuse cortical atrophy. Mild chronic ischemic white matter
disease. No acute intracranial abnormality seen.

Multilevel degenerative disc disease. No acute abnormality seen in
the cervical spine.

## 2021-08-06 NOTE — Telephone Encounter (Signed)
Erroneous encounter
